# Patient Record
Sex: Male | Born: 1950 | Race: White | Hispanic: No | Marital: Married | Smoking: Former smoker
Health system: Southern US, Community
[De-identification: ages and names within clinical notes are randomized; demographics above are authoritative.]

## PROBLEM LIST (undated history)

## (undated) DIAGNOSIS — H409 Unspecified glaucoma: Secondary | ICD-10-CM

## (undated) DIAGNOSIS — R519 Headache, unspecified: Secondary | ICD-10-CM

## (undated) DIAGNOSIS — Z9842 Cataract extraction status, left eye: Secondary | ICD-10-CM

## (undated) DIAGNOSIS — E119 Type 2 diabetes mellitus without complications: Secondary | ICD-10-CM

## (undated) DIAGNOSIS — Z7982 Long term (current) use of aspirin: Secondary | ICD-10-CM

## (undated) DIAGNOSIS — I739 Peripheral vascular disease, unspecified: Secondary | ICD-10-CM

## (undated) DIAGNOSIS — I779 Disorder of arteries and arterioles, unspecified: Secondary | ICD-10-CM

## (undated) DIAGNOSIS — G4730 Sleep apnea, unspecified: Secondary | ICD-10-CM

## (undated) DIAGNOSIS — I219 Acute myocardial infarction, unspecified: Secondary | ICD-10-CM

## (undated) DIAGNOSIS — I10 Essential (primary) hypertension: Secondary | ICD-10-CM

## (undated) DIAGNOSIS — C4491 Basal cell carcinoma of skin, unspecified: Secondary | ICD-10-CM

## (undated) DIAGNOSIS — E785 Hyperlipidemia, unspecified: Secondary | ICD-10-CM

## (undated) DIAGNOSIS — E109 Type 1 diabetes mellitus without complications: Secondary | ICD-10-CM

## (undated) DIAGNOSIS — J189 Pneumonia, unspecified organism: Secondary | ICD-10-CM

## (undated) DIAGNOSIS — E1142 Type 2 diabetes mellitus with diabetic polyneuropathy: Secondary | ICD-10-CM

## (undated) DIAGNOSIS — Z7902 Long term (current) use of antithrombotics/antiplatelets: Secondary | ICD-10-CM

## (undated) DIAGNOSIS — G5602 Carpal tunnel syndrome, left upper limb: Secondary | ICD-10-CM

## (undated) DIAGNOSIS — I209 Angina pectoris, unspecified: Secondary | ICD-10-CM

## (undated) DIAGNOSIS — M1990 Unspecified osteoarthritis, unspecified site: Secondary | ICD-10-CM

## (undated) DIAGNOSIS — I6529 Occlusion and stenosis of unspecified carotid artery: Secondary | ICD-10-CM

## (undated) DIAGNOSIS — M869 Osteomyelitis, unspecified: Secondary | ICD-10-CM

## (undated) DIAGNOSIS — Z972 Presence of dental prosthetic device (complete) (partial): Secondary | ICD-10-CM

## (undated) DIAGNOSIS — G4733 Obstructive sleep apnea (adult) (pediatric): Secondary | ICD-10-CM

## (undated) DIAGNOSIS — I2510 Atherosclerotic heart disease of native coronary artery without angina pectoris: Secondary | ICD-10-CM

## (undated) DIAGNOSIS — N183 Chronic kidney disease, stage 3 unspecified: Secondary | ICD-10-CM

## (undated) DIAGNOSIS — T7840XA Allergy, unspecified, initial encounter: Secondary | ICD-10-CM

## (undated) DIAGNOSIS — L97519 Non-pressure chronic ulcer of other part of right foot with unspecified severity: Secondary | ICD-10-CM

## (undated) DIAGNOSIS — I700 Atherosclerosis of aorta: Secondary | ICD-10-CM

## (undated) DIAGNOSIS — Z973 Presence of spectacles and contact lenses: Secondary | ICD-10-CM

## (undated) DIAGNOSIS — I6789 Other cerebrovascular disease: Secondary | ICD-10-CM

## (undated) DIAGNOSIS — K219 Gastro-esophageal reflux disease without esophagitis: Secondary | ICD-10-CM

## (undated) DIAGNOSIS — Z9622 Myringotomy tube(s) status: Secondary | ICD-10-CM

## (undated) DIAGNOSIS — Z22322 Carrier or suspected carrier of Methicillin resistant Staphylococcus aureus: Secondary | ICD-10-CM

## (undated) DIAGNOSIS — I5189 Other ill-defined heart diseases: Secondary | ICD-10-CM

## (undated) DIAGNOSIS — G629 Polyneuropathy, unspecified: Secondary | ICD-10-CM

## (undated) DIAGNOSIS — C4490 Unspecified malignant neoplasm of skin, unspecified: Secondary | ICD-10-CM

## (undated) DIAGNOSIS — J40 Bronchitis, not specified as acute or chronic: Secondary | ICD-10-CM

## (undated) DIAGNOSIS — Z9841 Cataract extraction status, right eye: Secondary | ICD-10-CM

## (undated) DIAGNOSIS — R0600 Dyspnea, unspecified: Secondary | ICD-10-CM

## (undated) DIAGNOSIS — N189 Chronic kidney disease, unspecified: Secondary | ICD-10-CM

## (undated) HISTORY — DX: Unspecified osteoarthritis, unspecified site: M19.90

## (undated) HISTORY — PX: CATARACT EXTRACTION, BILATERAL: SHX1313

## (undated) HISTORY — DX: Peripheral vascular disease, unspecified: I73.9

## (undated) HISTORY — PX: EYE SURGERY: SHX253

## (undated) HISTORY — PX: JOINT REPLACEMENT: SHX530

## (undated) HISTORY — PX: APPENDECTOMY: SHX54

## (undated) HISTORY — DX: Non-pressure chronic ulcer of other part of right foot with unspecified severity: L97.519

## (undated) HISTORY — PX: MULTIPLE TOOTH EXTRACTIONS: SHX2053

## (undated) HISTORY — DX: Unspecified glaucoma: H40.9

## (undated) HISTORY — DX: Type 2 diabetes mellitus without complications: E11.9

## (undated) HISTORY — DX: Unspecified malignant neoplasm of skin, unspecified: C44.90

## (undated) HISTORY — PX: KNEE ARTHROSCOPY: SHX127

## (undated) HISTORY — PX: CATARACT EXTRACTION W/ INTRAOCULAR LENS  IMPLANT, BILATERAL: SHX1307

## (undated) HISTORY — DX: Essential (primary) hypertension: I10

## (undated) SURGERY — LEFT HEART CATH AND CORONARY ANGIOGRAPHY
Anesthesia: Moderate Sedation

---
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
## 1998-11-03 HISTORY — PX: KNEE ARTHROSCOPY: SHX127

## 2007-07-05 DIAGNOSIS — I2510 Atherosclerotic heart disease of native coronary artery without angina pectoris: Secondary | ICD-10-CM

## 2007-07-05 HISTORY — DX: Atherosclerotic heart disease of native coronary artery without angina pectoris: I25.10

## 2007-07-27 DIAGNOSIS — I214 Non-ST elevation (NSTEMI) myocardial infarction: Secondary | ICD-10-CM

## 2007-07-27 HISTORY — DX: Non-ST elevation (NSTEMI) myocardial infarction: I21.4

## 2007-07-29 ENCOUNTER — Inpatient Hospital Stay: Payer: Self-pay | Admitting: Unknown Physician Specialty

## 2007-07-29 ENCOUNTER — Other Ambulatory Visit: Payer: Self-pay

## 2007-07-30 HISTORY — PX: LEFT HEART CATH AND CORONARY ANGIOGRAPHY: CATH118249

## 2008-07-20 ENCOUNTER — Ambulatory Visit: Payer: Self-pay

## 2008-08-08 ENCOUNTER — Other Ambulatory Visit: Payer: Self-pay

## 2008-08-08 ENCOUNTER — Ambulatory Visit: Payer: Self-pay | Admitting: Unknown Physician Specialty

## 2008-08-11 ENCOUNTER — Ambulatory Visit: Payer: Self-pay | Admitting: Unknown Physician Specialty

## 2011-05-28 ENCOUNTER — Ambulatory Visit: Payer: Self-pay | Admitting: Ophthalmology

## 2012-07-16 HISTORY — PX: CATARACT EXTRACTION W/ INTRAOCULAR LENS IMPLANT: SHX1309

## 2012-08-13 HISTORY — PX: CATARACT EXTRACTION W/ INTRAOCULAR LENS IMPLANT: SHX1309

## 2013-09-08 ENCOUNTER — Encounter: Payer: Self-pay | Admitting: Podiatry

## 2013-09-08 ENCOUNTER — Ambulatory Visit: Admitting: Podiatry

## 2013-09-08 VITALS — BP 115/58 | HR 80 | Resp 16 | Ht 71.0 in | Wt 273.0 lb

## 2013-09-08 DIAGNOSIS — M79609 Pain in unspecified limb: Secondary | ICD-10-CM

## 2013-09-08 DIAGNOSIS — B351 Tinea unguium: Secondary | ICD-10-CM

## 2013-09-08 NOTE — Progress Notes (Signed)
Tailor presents today as a 62 year old white male with a chief complaint of painful  thick mycotic nails, diabetes, any heel fissures.  Objective: Pulses are strongly palpable bilateral lower extremity. Nails are thick yellow dystrophic clinically mycotic and sharply incurvated.  Assessment: Pain in limb secondary to onychomycosis 1 through 5 bilateral.  Plan: Debridement of nails in thickness and length as a covered service secondary to pain followup with him in 3 months.

## 2013-10-03 ENCOUNTER — Telehealth: Payer: Self-pay | Admitting: *Deleted

## 2013-10-03 NOTE — Telephone Encounter (Signed)
You may refill °

## 2013-10-03 NOTE — Telephone Encounter (Signed)
Received fax refill request.

## 2013-10-04 MED ORDER — L-METHYLFOLATE-B6-B12 3-35-2 MG PO TABS: 1.0000 | ORAL_TABLET | Freq: Two times a day (BID) | ORAL | Status: DC

## 2013-10-07 ENCOUNTER — Telehealth: Payer: Self-pay | Admitting: *Deleted

## 2013-10-07 NOTE — Telephone Encounter (Signed)
Pt called said he did not receive metanax from his pharmacy, received foltanx. Per Morrie Sheldon this is the same thing, its a supplement and its cheaper. Pt did understand and will start usage.

## 2013-10-11 ENCOUNTER — Observation Stay: Payer: Self-pay | Admitting: Student

## 2013-10-11 LAB — BASIC METABOLIC PANEL
Anion Gap: 7 (ref 7–16)
Calcium, Total: 9 mg/dL (ref 8.5–10.1)
Chloride: 101 mmol/L (ref 98–107)
Creatinine: 1.11 mg/dL (ref 0.60–1.30)
Osmolality: 290 (ref 275–301)
Sodium: 136 mmol/L (ref 136–145)

## 2013-10-11 LAB — CBC
HCT: 38 % — ABNORMAL LOW (ref 40.0–52.0)
MCH: 32.1 pg (ref 26.0–34.0)
MCHC: 33.7 g/dL (ref 32.0–36.0)
MCV: 96 fL (ref 80–100)
Platelet: 230 10*3/uL (ref 150–440)
RDW: 13.2 % (ref 11.5–14.5)
WBC: 8.4 10*3/uL (ref 3.8–10.6)

## 2013-10-11 LAB — LIPID PANEL
Ldl Cholesterol, Calc: 79 mg/dL (ref 0–100)
Triglycerides: 53 mg/dL (ref 0–200)

## 2013-10-11 LAB — TROPONIN I
Troponin-I: <0.02 ng/mL
Troponin-I: <0.02 ng/mL

## 2013-10-12 LAB — CBC WITH DIFFERENTIAL/PLATELET
Basophil #: 0 10*3/uL (ref 0.0–0.1)
Basophil %: 0.6 %
Eosinophil #: 0.2 10*3/uL (ref 0.0–0.7)
Eosinophil %: 2.8 %
HCT: 36.9 % — ABNORMAL LOW (ref 40.0–52.0)
Lymphocyte #: 2.3 10*3/uL (ref 1.0–3.6)
Lymphocyte %: 33.1 %
MCH: 32.9 pg (ref 26.0–34.0)
MCV: 95 fL (ref 80–100)
Monocyte #: 0.7 x10 3/mm (ref 0.2–1.0)
Neutrophil #: 3.7 10*3/uL (ref 1.4–6.5)
Neutrophil %: 53.4 %
RBC: 3.89 10*6/uL — ABNORMAL LOW (ref 4.40–5.90)
RDW: 13.1 % (ref 11.5–14.5)
WBC: 6.9 10*3/uL (ref 3.8–10.6)

## 2013-10-12 LAB — BASIC METABOLIC PANEL
Anion Gap: 5 — ABNORMAL LOW (ref 7–16)
BUN: 20 mg/dL — ABNORMAL HIGH (ref 7–18)
Calcium, Total: 8.8 mg/dL (ref 8.5–10.1)
Chloride: 101 mmol/L (ref 98–107)
EGFR (African American): >60
EGFR (Non-African Amer.): >60
Glucose: 218 mg/dL — ABNORMAL HIGH (ref 65–99)
Potassium: 3.9 mmol/L (ref 3.5–5.1)
Sodium: 137 mmol/L (ref 136–145)

## 2013-12-07 ENCOUNTER — Ambulatory Visit (INDEPENDENT_AMBULATORY_CARE_PROVIDER_SITE_OTHER): Payer: Medicare Other | Admitting: Podiatry

## 2013-12-07 ENCOUNTER — Encounter: Payer: Self-pay | Admitting: Podiatry

## 2013-12-07 VITALS — BP 127/69 | HR 82 | Resp 16 | Ht 71.0 in | Wt 293.0 lb

## 2013-12-07 DIAGNOSIS — M79609 Pain in unspecified limb: Secondary | ICD-10-CM

## 2013-12-07 DIAGNOSIS — B351 Tinea unguium: Secondary | ICD-10-CM

## 2013-12-07 MED ORDER — CEPHALEXIN 500 MG PO CAPS: 500.0000 mg | ORAL_CAPSULE | Freq: Two times a day (BID) | ORAL | Status: DC

## 2013-12-07 NOTE — Progress Notes (Signed)
He presents today with painful toenails one through 5 bilateral.  Objective evaluation reveals vital signs are stable he is alert and oriented x3. Nails are thick yellow dystrophic onychomycotic and painful palpation. His sharp incurvated nail margins in irritation to the distal aspect of the hallux right. I debrided the nail margins today with with no purulence noted.  Assessment: Pain in limb secondary to onychomycosis 1 through 5 bilateral. Possible abscess hallux right.  Plan: Debridement nails 1 through 5 bilateral is cover service secondary to pain. I placed him on Keflex 500 mg 1 by mouth twice a day. He'll soak his foot in Epsom salts warm water twice daily. Followup with him in 2 weeks for this evaluation. In 3 months for routine nail debridement.

## 2013-12-21 ENCOUNTER — Ambulatory Visit: Admitting: Podiatry

## 2014-03-08 ENCOUNTER — Ambulatory Visit (INDEPENDENT_AMBULATORY_CARE_PROVIDER_SITE_OTHER): Payer: Medicare Other | Admitting: Podiatry

## 2014-03-08 ENCOUNTER — Encounter: Payer: Self-pay | Admitting: Podiatry

## 2014-03-08 VITALS — BP 135/70 | HR 86 | Resp 16

## 2014-03-08 DIAGNOSIS — M79609 Pain in unspecified limb: Secondary | ICD-10-CM

## 2014-03-08 DIAGNOSIS — B351 Tinea unguium: Secondary | ICD-10-CM

## 2014-03-08 NOTE — Progress Notes (Signed)
He presents today with a chief complaint of painful elongated toenails one through 5 bilateral.  Objective: Pulses are strongly palpable bilateral. Nails are thick yellow dystrophic with mycotic and painful palpation.  Assessment: Pain in limb secondary to onychomycosis 1 through 5 bilateral.  Plan: Debridement of nails 1 through 5 bilateral covered service secondary to pain.

## 2014-06-07 ENCOUNTER — Ambulatory Visit (INDEPENDENT_AMBULATORY_CARE_PROVIDER_SITE_OTHER): Payer: Medicare Other | Admitting: Podiatry

## 2014-06-07 ENCOUNTER — Encounter: Payer: Self-pay | Admitting: Podiatry

## 2014-06-07 DIAGNOSIS — B351 Tinea unguium: Secondary | ICD-10-CM

## 2014-06-07 DIAGNOSIS — M79609 Pain in unspecified limb: Secondary | ICD-10-CM

## 2014-06-07 DIAGNOSIS — M79676 Pain in unspecified toe(s): Secondary | ICD-10-CM

## 2014-06-07 MED ORDER — L-METHYLFOLATE-B6-B12 3-35-2 MG PO TABS: 1.0000 | ORAL_TABLET | Freq: Two times a day (BID) | ORAL | Status: DC

## 2014-06-07 NOTE — Progress Notes (Signed)
Presents today with a chief complaint of painful elongated toenails one through 5 bilateral.  Objective: Nails are thick yellow dystrophic with mycotic and painful palpation.  Assessment: Pain in limb secondary to onychomycosis 1 through 5 bilateral.  Plan: Discussed etiology pathology conservative or surgical therapies debridement nails 1 through 5 bilateral covered service secondary to pain to 

## 2014-06-08 ENCOUNTER — Other Ambulatory Visit: Payer: Self-pay | Admitting: *Deleted

## 2014-06-08 ENCOUNTER — Telehealth: Payer: Self-pay | Admitting: *Deleted

## 2014-06-08 NOTE — Telephone Encounter (Signed)
Left message for patient to call us back.. This is regarding metanx not being a covered , wanting patient to be aware of this , ask patient what he would like to do ?

## 2014-09-13 ENCOUNTER — Ambulatory Visit (INDEPENDENT_AMBULATORY_CARE_PROVIDER_SITE_OTHER): Payer: Medicare Other | Admitting: Podiatry

## 2014-09-13 DIAGNOSIS — B351 Tinea unguium: Secondary | ICD-10-CM

## 2014-09-13 DIAGNOSIS — M79676 Pain in unspecified toe(s): Secondary | ICD-10-CM

## 2014-09-13 NOTE — Progress Notes (Signed)
Presents today with a chief complaint of painful elongated toenails one through 5 bilateral.  Objective: Nails are thick yellow dystrophic with mycotic and painful palpation.  Assessment: Pain in limb secondary to onychomycosis 1 through 5 bilateral.  Plan: Discussed etiology pathology conservative or surgical therapies debridement nails 1 through 5 bilateral covered service secondary to pain to 

## 2014-11-09 DIAGNOSIS — M79605 Pain in left leg: Secondary | ICD-10-CM | POA: Diagnosis not present

## 2014-11-15 DIAGNOSIS — M79605 Pain in left leg: Secondary | ICD-10-CM | POA: Diagnosis not present

## 2014-11-22 DIAGNOSIS — I10 Essential (primary) hypertension: Secondary | ICD-10-CM | POA: Diagnosis not present

## 2014-11-22 DIAGNOSIS — I25119 Atherosclerotic heart disease of native coronary artery with unspecified angina pectoris: Secondary | ICD-10-CM | POA: Diagnosis not present

## 2014-11-22 DIAGNOSIS — E782 Mixed hyperlipidemia: Secondary | ICD-10-CM | POA: Diagnosis not present

## 2014-11-23 DIAGNOSIS — M79605 Pain in left leg: Secondary | ICD-10-CM | POA: Diagnosis not present

## 2014-11-30 DIAGNOSIS — M79605 Pain in left leg: Secondary | ICD-10-CM | POA: Diagnosis not present

## 2014-12-07 DIAGNOSIS — M79605 Pain in left leg: Secondary | ICD-10-CM | POA: Diagnosis not present

## 2014-12-20 ENCOUNTER — Ambulatory Visit (INDEPENDENT_AMBULATORY_CARE_PROVIDER_SITE_OTHER): Payer: Medicare Other | Admitting: Podiatry

## 2014-12-20 DIAGNOSIS — M79676 Pain in unspecified toe(s): Secondary | ICD-10-CM

## 2014-12-20 DIAGNOSIS — B351 Tinea unguium: Secondary | ICD-10-CM

## 2014-12-20 NOTE — Progress Notes (Signed)
Presents today with a chief complaint of painful elongated toenails one through 5 bilateral.  Objective: Nails are thick yellow dystrophic with mycotic and painful palpation.  Assessment: Pain in limb secondary to onychomycosis 1 through 5 bilateral.  Plan: Discussed etiology pathology conservative or surgical therapies debridement nails 1 through 5 bilateral covered service secondary to pain to

## 2014-12-28 DIAGNOSIS — M79605 Pain in left leg: Secondary | ICD-10-CM | POA: Diagnosis not present

## 2015-01-01 DIAGNOSIS — I119 Hypertensive heart disease without heart failure: Secondary | ICD-10-CM | POA: Diagnosis not present

## 2015-01-01 DIAGNOSIS — I10 Essential (primary) hypertension: Secondary | ICD-10-CM | POA: Diagnosis not present

## 2015-01-01 DIAGNOSIS — E785 Hyperlipidemia, unspecified: Secondary | ICD-10-CM | POA: Diagnosis not present

## 2015-01-01 DIAGNOSIS — G4733 Obstructive sleep apnea (adult) (pediatric): Secondary | ICD-10-CM | POA: Diagnosis not present

## 2015-01-01 DIAGNOSIS — E119 Type 2 diabetes mellitus without complications: Secondary | ICD-10-CM | POA: Diagnosis not present

## 2015-01-01 DIAGNOSIS — H47339 Pseudopapilledema of optic disc, unspecified eye: Secondary | ICD-10-CM | POA: Diagnosis not present

## 2015-01-08 ENCOUNTER — Ambulatory Visit (INDEPENDENT_AMBULATORY_CARE_PROVIDER_SITE_OTHER): Payer: Medicare Other

## 2015-01-08 ENCOUNTER — Ambulatory Visit (INDEPENDENT_AMBULATORY_CARE_PROVIDER_SITE_OTHER): Payer: Medicare Other | Admitting: Podiatry

## 2015-01-08 VITALS — BP 125/55 | HR 72 | Resp 16

## 2015-01-08 DIAGNOSIS — L97511 Non-pressure chronic ulcer of other part of right foot limited to breakdown of skin: Secondary | ICD-10-CM

## 2015-01-08 DIAGNOSIS — L89891 Pressure ulcer of other site, stage 1: Secondary | ICD-10-CM

## 2015-01-08 MED ORDER — AMOXICILLIN-POT CLAVULANATE 875-125 MG PO TABS: 1.0000 | ORAL_TABLET | Freq: Two times a day (BID) | ORAL | Status: DC

## 2015-01-08 NOTE — Progress Notes (Signed)
He presents today with a history of diabetes concerned about the redness in his hallux right. He states this to like it's about the right off and it has not been traumatized and has not been rubbing anything.  Objective: Vital signs are stable he is alert and oriented 3. 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 The hallux right demonstrates blistering to the distalmost aspect was cellulitis extending to the level of the IP joint. The distalmost aspect of the toe and the skin are macerated and easily removed. There is no purulence but there is mild malodor.  Assessment: Cellulitis. Complication diabetic foot hallux right.  Plan: Debrided the ulcerative lesion today to bleeding dressed with Silvadene cream and a dry sterile compressive dressing. Wrote a prescription for Augmentin 875 mg 1 by mouth twice a day. He will start soaking twice daily and abscess also water and he was dispensed a Darco shoe. I will follow-up with him in 1 week

## 2015-01-15 ENCOUNTER — Other Ambulatory Visit: Payer: Self-pay | Admitting: *Deleted

## 2015-01-15 ENCOUNTER — Ambulatory Visit (INDEPENDENT_AMBULATORY_CARE_PROVIDER_SITE_OTHER): Payer: Medicare Other | Admitting: Podiatry

## 2015-01-15 VITALS — BP 138/72 | HR 78 | Resp 16

## 2015-01-15 DIAGNOSIS — L89891 Pressure ulcer of other site, stage 1: Secondary | ICD-10-CM | POA: Diagnosis not present

## 2015-01-15 DIAGNOSIS — L97511 Non-pressure chronic ulcer of other part of right foot limited to breakdown of skin: Secondary | ICD-10-CM

## 2015-01-15 MED ORDER — AMOXICILLIN-POT CLAVULANATE 875-125 MG PO TABS: 1.0000 | ORAL_TABLET | Freq: Two times a day (BID) | ORAL | Status: DC

## 2015-01-15 NOTE — Progress Notes (Signed)
He presents today for follow-up of his hallux right superficial ulceration that was present. He states that he continues to put Neosporin on it and has not yet received his antibiotics which he ordered by mail.  Objective: Vital signs are stable he is alert and oriented 3 the distalmost aspect of the toe appears to be soft and moist with white scan around the ulcerative site this does not probe deep to bone nor does it need to be debrided to bleeding. The toe is mildly erythematous but I think this associated with secondary superficial infection.  Assessment: Hallux ulceration distal right toe.  Plan: Continue to soak discontinue Neosporin covered during the day and leave open at night and night. Start antibiotics once received. He will follow-up with Dr. Jacqualyn Posey in 2 weeks.

## 2015-01-29 DIAGNOSIS — G4733 Obstructive sleep apnea (adult) (pediatric): Secondary | ICD-10-CM | POA: Diagnosis not present

## 2015-01-29 DIAGNOSIS — D510 Vitamin B12 deficiency anemia due to intrinsic factor deficiency: Secondary | ICD-10-CM | POA: Diagnosis not present

## 2015-01-30 ENCOUNTER — Ambulatory Visit (INDEPENDENT_AMBULATORY_CARE_PROVIDER_SITE_OTHER): Payer: Medicare Other | Admitting: Podiatry

## 2015-01-30 ENCOUNTER — Encounter: Payer: Self-pay | Admitting: Podiatry

## 2015-01-30 DIAGNOSIS — L600 Ingrowing nail: Secondary | ICD-10-CM

## 2015-02-01 NOTE — Progress Notes (Signed)
Patient ID: Lawrence [name], male   DOB: 08-Jan-1951, 64 y.o.   MRN: [id number]  Subjective: 64 year old male presents the office they for follow-up evaluation of right hallux superficial ulceration. He states the wound is doing great has closed however he does now have an ingrown toenail along the inside border of the same toenail. He denies any drainage associated with the area. He denies any specific redness to this area although is difficult to evaluate due to the previous ulceration. He denies any systemic complaints as fevers, chills, nausea, vomiting. He is currently on Keflex for which she has a couple more days left. No other complaints at this time.  Objective: AAO 3, NAD DP/PT pulses palpable, CRT less than 3 seconds  Protective sensation decreased with Simms Weinstein monofilament, Achilles tendon reflex intact. Right hallux there is no ulceration identify this time. Over the site of the apparent previous ulceration there is some dry skin in hyperkeratotic lesion. Upon debridement of the lesion there is no underlying ulceration of the skin is intact. There is trace erythema to the digit although it does appear to be resolving. There is tenderness to palpation along the medial aspect of the left hallux toenail distally. There is no pain on the proximal medial border of the toenail. There is no purulence or drainage expressed from the toenail. There is no pain along the lateral nail border. No other areas of tenderness to bilateral lower extremity. No other open lesions or pre-ulcerative lesions are identified. No pain with calf compression, swelling, warmth, erythema.  Assessment: 64 year old male with healed left hallux ulceration; left distal medial symptomatic ingrown toenail  Plan: -Treatment options were discussed the patient lying alternatives, risks, competitions. -At this time the ulceration is healed. Hyperkeratotic tissue on the distal aspect of the toe is sharply debrided without  complication/bleeding. No underlying ulceration. Monitor for any signs or symptoms of reoccurrence. -For the ingrown toenail right discussed either slant back procedure partial nail avulsion. However given the history of ulceration of this toe recommend a slant back procedure. Patient agrees. The nail sharply debrided without complications to patient comfort to remove the symptomatic border of the ingrown toenail. After debridement of the nail the patient had resolution of symptoms. There is no purulence or drainage expressed with the procedure. -Finish course of antibiotics. -Follow-up in 2 weeks or sooner if any problems are to arise. In the meantime encouraged to call the office with any questions, concerns, change in symptoms.

## 2015-02-13 ENCOUNTER — Encounter: Payer: Self-pay | Admitting: Podiatry

## 2015-02-13 ENCOUNTER — Ambulatory Visit (INDEPENDENT_AMBULATORY_CARE_PROVIDER_SITE_OTHER): Payer: Medicare Other | Admitting: Podiatry

## 2015-02-13 VITALS — BP 135/80 | HR 82 | Resp 16

## 2015-02-13 DIAGNOSIS — E1140 Type 2 diabetes mellitus with diabetic neuropathy, unspecified: Secondary | ICD-10-CM

## 2015-02-13 DIAGNOSIS — M79676 Pain in unspecified toe(s): Secondary | ICD-10-CM

## 2015-02-13 DIAGNOSIS — L84 Corns and callosities: Secondary | ICD-10-CM

## 2015-02-13 DIAGNOSIS — B351 Tinea unguium: Secondary | ICD-10-CM | POA: Diagnosis not present

## 2015-02-13 DIAGNOSIS — E1149 Type 2 diabetes mellitus with other diabetic neurological complication: Secondary | ICD-10-CM

## 2015-02-13 NOTE — Progress Notes (Signed)
Patient ID: Lawrence [name], male   DOB: [birth year], 64 y.o.   MRN: [id number]  Subjective: 64 y.o.-year-old male returns the office today for follow up evaluation of right hallux ulceration as well as painful, elongated, thickened toenails which he is unable to trim himself. He states the ulcer site has healed and he no longer puts any ointments on it. Denies any redness or drainage around the nails or along the site of the ulceration. States the left hallux toenail has been doing "great" since it was trimmed last appointment. Denies any acute changes since last appointment and no new complaints today. Denies any systemic complaints such as fevers, chills, nausea, vomiting.   Objective: AAO 3, NAD DP/PT pulses palpable, CRT less than 3 seconds Protective sensation decreased with Simms Weinstein monofilament, Achilles tendon reflex intact.  Nails hypertrophic, dystrophic, elongated, brittle, discolored 10. There is tenderness overlying the nails 1-5 bilaterally. There is no surrounding erythema or drainage along the nail sites. Ulceration of the distal aspect of the right hallux has healed. There is overlying hyperkerotic tissues. Upon debridement the underlying skin is intact and there is no ulceration, drainage, or other signs of infection.  No open lesions or other pre-ulcerative lesions are identified. No other areas of tenderness bilateral lower extremities. No overlying edema, erythema, increased warmth. No pain with calf compression, swelling, warmth, erythema.  Assessment: Patient presents with symptomatic onychomycosis; healed right hallux ulceration.   Plan: -Treatment options including alternatives, risks, complications were discussed -Nails sharply debrided 10 without complication/bleeding. -Right hallux hyperkerotic tissue debrided without complications/bleeding. Dispensed offloading pads. -Discussed daily foot inspection. If there are any changes, to call the office immediately.   -Follow-up in 3 months or sooner if any problems are to arise. In the meantime, encouraged to call the office with any questions, concerns, changes symptoms.

## 2015-02-23 NOTE — Discharge Summary (Signed)
PATIENT NAME:  Lawrence [name], Lawrence [name] MR#:  [id number] DATE OF BIRTH:  30-Jul-1951  DATE OF ADMISSION:  10/11/2013 DATE OF DISCHARGE:  10/12/2013  CONSULTANTS: Dr. Nehemiah Massed from cardiology.   PRIMARY CARE PHYSICIAN:  Dr. Nicky Pugh, and also he goes to the [place].    CHIEF COMPLAINT:   Chest pain.  DISCHARGE DIAGNOSES: 1.  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  2.  Labile diabetes.  3.  Coronary artery disease.  4.  Seasonal allergies. 5.  Neuropathy.  6.  Sleep apnea.  7.  Hyperlipidemia.  8.  Hypertension.   DISCHARGE MEDICATIONS:  1.  Nitroglycerin sublingual p.r.n. 2.  Vitamin D3, 1000 units 2 caps once a day.  3.  Zyrtec 10 mg daily.  4.  Metoprolol tartrate 25 mg 2 times a day.  5.  Chlorthalidone 25 mg 1/2 tab once a day.  6.  Insulin aspart sliding scale per carbohydrate counting.  7.  Ranexa 1000 mg 2 times a day.  8.  Vitamin C 500 mg 2 times a day.  9.  Niacin 500 mg 2 times a day.  10. Imethyl 1 tab 2 times a day.  11.  Cinnamon 500 mg 2 caps 2 times a day. 12.  Insulin glargine 15 units at bedtime.   DIET: Low-sodium, low-fat, low-cholesterol, ADA diet.   ACTIVITY: As tolerated.   Please follow with PCP within 1 to 2 weeks. Please follow with Dr. Nehemiah Massed within a week. Please follow with your endocrinologist on Monday, as previously scheduled.   DISPOSITION: Home.   SIGNIFICANT LABS AND IMAGING: Troponins were negative x 3. Initial sugar was 375, BUN 21, creatinine 1.11. White count of 8.4 on admission and LDL of 79 on admission. Echocardiogram done on December 10 showing EF of 50% to 55%, normal global LV systolic function.   HISTORY OF PRESENT ILLNESS AND HOSPITAL COURSE: For full details of the H and P, please see the dictation done on December 9 by Dr. Laurin Coder, but briefly this is a Lawrence [name] with a history of well known coronary artery disease with multiple defects including 85% distal right coronary artery, 30% proximal LAD, 99% MD1 and 50% circumflex  and 40% distal circumflex with diffuse disease in distal obtuse. He has been having labile blood sugars as an outpatient, although compliant with diet. He does see an endocrinologist as an outpatient as well. He came in after having chest pain. He was noted to have blood sugars of 25, given glucagon and glucose.  He was also possibly unresponsive. He got some glucagon and glucose per H and P. He attempted to go back to sleep, but had some anxiety, and EMS was called again  after the patient experienced some chest pain. He was admitted to hospital service for observation and ruling out acute coronary syndrome. While here, he did get ruled out for acute coronary syndrome. He does have known multivessel disease, and the case was discussed with Dr. Nehemiah Massed, his cardiologist, who recommended outpatient followup, as he has been ruled out for acute coronary syndrome and has no further chest pain. I believe the unresponsive episode was possibly metabolic encephalopathy secondary to hypoglycemia, which has resolved. Here, we have cut back on his Lantus and will be discharged on a lower dose of Lantus with a sliding scale insulin, and he is to follow up with his primary endocrinologist as an outpatient for further medication adjustments. He did undergo an echocardiogram per Dr. Nehemiah Massed, and was noted to have good EF. If  he was to have lower EF, a cardiac cath and further intervention would have been recommended, but at this point, without any acute coronary syndrome, no chest pains and an echo showing the above EF, per Dr. Nehemiah Massed, it is okay for him to get discharged. He is to follow up with Dr. Nehemiah Massed within a week, preferably early next week, and verbalized understanding. At this point, he will be discharged with outpatient followup.   Total time spent is 32 minutes.   The patient is FULL CODE.   ____________________________ Vivien Presto, MD sa:dmm D: 10/12/2013 17:24:15 ET T: 10/12/2013 20:10:01  ET JOB#: [id number]  cc: Vivien Presto, MD, <Dictator> Mikeal Hawthorne. Brynda Greathouse, MD Vivien Presto MD ELECTRONICALLY SIGNED 10/18/2013 11:08

## 2015-02-23 NOTE — H&P (Signed)
PATIENT NAME:  Lawrence [name], Lawrence [name] MR#:  [id number] DATE OF BIRTH:  07-Jun-1951  DATE OF ADMISSION:  10/11/2013  PRIMARY CARE PHYSICIAN: Dr. Nicky Pugh and also the [facility].  ENDOCRINOLOGIST:  The patient has an endocrinologist at the [facility] as well.   CARDIOLOGIST: Dr. Serafina Royals.   CHIEF COMPLAINT: Chest pain.   REFERRING PHYSICIAN: Dr. Marjean Donna.   HISTORY OF PRESENT ILLNESS: This is a very nice, 64 year old gentleman, who has history of very well known coronary artery disease with multiple defects including 85% distal right coronary artery, 30% proximal LAD, 99% D1, 50% circumflex, 40% distal circumflex with diffuse disease on distal obtuse. The patient has been recommended to do medical therapy including exercise, diet and taking medications. When he has been compliant with his diet, he actually had a weight loss from 290 down to 270, but now is back to 291. The patient also has hypertension, hyperlipidemia and sleep apnea. The patient comes today with a history of being unresponsive at 1:00 a.m. in the morning. His wife noticed that the patient was sweaty and not waking up and that was after taking his normal dose of insulin and eating normally through the day. The patient's wife called the ambulance.  EMS arrived and checked his blood sugar and it was 25. He was given medications apparently glucagon and glucose and then the patient woke up  and he was asymptomatic. He was really scared. He tried to go back to bed. He checked his blood sugar again at around 4:00 a.m. and he was in the 300s, for what he needed to give some of his insulin sliding scale correction. The patient could not sleep and went down to bed and he was just feeling very anxious. He started having significant chest pain. The chest pain was described as roving back around the chest that expanded and contracted multiple times. It was 10 out of  10, feeling of tightness and pressure and it made him more anxious. He  felt like his heart was racing a little bit. He had no radiation of the pain. The pain continued for what he called EMS. EMS gave him nitroglycerin in the ambulance and the pain started to resolve and by now the pain is gone. In the ER, the patient got evaluated. There was no significant tachypnea, shortness of breath, nausea or diaphoresis associated with these episodes. The patient had an EKG that did not any significant changes. His first cardiac enzymes are negative. We are going to admit him to the hospital to rule out acute coronary syndrome for now and monitor him and get Dr. Nehemiah Massed to see him for medical therapy and try to discharge him possibly today or tomorrow. As far as his medications, he is taking them religiously. As far as his insulin, there has not been any changes in his insulin. There has not been any significant changes in his diet to make him have hypoglycemia. The patient denies any signs of infection or problems. At this moment, he is asymptomatic and admitted for rule out acute coronary syndrome.    REVIEW OF SYSTEMS: CONSTITUTIONAL: No fever, fatigue or weakness. EYES: No double vision, blurry vision. EARS, NOSE, THROAT: No tinnitus, epistaxis or difficulty swallowing. RESPIRATORY: No cough, wheezing or hemoptysis. CARDIOVASCULAR: Positive chest pain as mentioned above. The patient states that he does have regular chest pains on a regular basis. 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 It does not happen significantly when he walks because he does not walk major distances. He does not like to exercise. No orthopnea, edema or syncope. GASTROINTESTINAL: No nausea, vomiting, abdominal pain, constipation or diarrhea. GENITOURINARY: No dysuria or hematuria. No prostatitis. ENDOCRINE: No polyuria, polydipsia, polyphagia, cold or heat intolerance. HEMATOLOGIC  AND LYMPHATIC: No anemia, easy bruising or bleeding.  SKIN: No rashes or petechiae. MUSCULOSKELETAL: No significant neck pain, back pain or gout. NEUROLOGIC: No numbness, tingling, or CVAs. PSYCHIATRIC: No anxiety, depression or insomnia on a regular basis.   PAST MEDICAL HISTORY: 1.  Coronary artery disease with positive cardiac catheterization with multiple defects. They are not able to be fixed by stents. Also, last stress test on 07/11/2013 shows significant findings related with ischemia.  2.  Hypertension.  3.  Hyperlipidemia.  4.  Sleep apnea.  5.  Type 2 diabetes. 6.  Neuropathy. 7.  Seasonal allergies.  CURRENT MEDICATIONS: Include Lantus 17 units every night, chlorthalidone 25 mg half tablet once a day, Lopressor 50 mg twice daily, Nitrostat 0.4 mg as directed, NovoLog sliding scale, Niacin 500 mg 5 tablets once a day, Travatan C drops 0.04% and Ranexa 1000 mg every 12 hours.   ALLERGIES: The patient is allergic to [employer].   PAST SURGICAL HISTORY: Appendectomy.   SOCIAL HISTORY: The patient is married, lives with his wife. He does not drink. He used to smoke, but he quit 17 years ago. He works at [employer].   FAMILY HISTORY: His mother had coronary artery disease and throat cancer. His father also had coronary artery disease. He had a brother, who died from significant overdose. No other cancers.   PHYSICAL EXAMINATION:  VITAL SIGNS: Blood pressure 138/57, pulse 82, respirations 12, temperature 97.6.  GENERAL: Alert and oriented x 3, in no acute distress. No respiratory distress. Hemodynamically stable.  HEENT: Pupils are equal and reactive. Extraocular movements are intact. Mucosa are moist. Anicteric sclerae. Pink conjunctivae. No oral lesions. No oropharyngeal exudates.  NECK: Supple. No JVD. No thyromegaly. No adenopathy. No carotid bruits.  CARDIOVASCULAR: Regular rate and rhythm. No murmurs, rubs or gallops. No displacement of PMI. No tenderness to palpation of  anterior chest wall.  LUNGS: Clear without any wheezing or crepitus. No use of accessory muscles.  ABDOMEN: Soft, nontender, nondistended. No hepatosplenomegaly. No masses. Bowel sounds are positive.  GENITAL: Deferred.  EXTREMITIES: No edema, cyanosis or clubbing. Pulses +2. Capillary refill less than 3.  MUSCULOSKELETAL: No significant joint abnormalities or joint deformity.  VASCULAR: Capillary refill less than 3 seconds, pulses are strong in all 4 extremities, +2.  NEUROLOGIC: Cranial nerves II through XII intact. Strength is 5 out of 5 in 4 extremities. Sensation is slightly decreased on the plantar area due to neuropathy, but overall intact on other places.  PSYCHIATRIC: No significant agitation or anxiety. Affect seems to be normal. Alert, oriented x 3.  RESULTS: Glucose 375, BUN 21, creatinine 1.1, potassium 3.9. Other electrolytes within normal limits. Troponin 0.02. White count 8.4, hemoglobin 12.8, platelet count 230. EKG: Unchanged from comparison with Dr. Alveria Apley with no ST depression or elevation and no acute findings of ischemia. Normal sinus rhythm. Chest x-ray: No acute cardiopulmonary problems.   ASSESSMENT AND PLAN:  52.  A 64 year old gentleman with history of significant coronary artery disease, very well known defects, likely he has significant stable angina. The episode today seems to be related to the hypoglycemia releasing his stress  hormones creating more work load to the heart. The patient was possibly tachycardic due to the hypoglycemia and anxiety to make him use or consume more oxygen on his myocardium. At this moment, there are no signs of myocardial injury for what we are going to him out with only cardiac enzymes. I am going ask Dr. Nehemiah Massed to see him just to make sure that he is aware of the situation to evaluate the possibility or problems. I do not know if he is a good candidate for CABG with the magnitude of his defects in the distal lesions, probably is not a  possibility, so we have to optimize his medical management. Continue beta blocker. Continue aspirin. Continue nitroglycerin as needed. Continue Ranexa. Have to see some diet and exercise. We are going to check his lipid profile as the patient is not taking his statins. He will benefit from statin if he can tolerate it.  2.  As far as his diabetes, continue to monitor closely. Decrease the dose of his Lantus as he continued to have hypoglycemia 2 days in a row and follow up closely with endocrinology.  3.  As far as hypertension, blood pressure seems to be stable. Continue metoprolol and chlorthalidone.  4.  As far as his sleep apnea, continue CPAP. We are going to try to discharge him today for what he can go back home and use his CPAP at home.   I spent about 45 minutes with this patient. DVT prophylaxis with Lovenox. I do not think that he needs a full dose right now, but if any of his cardiac enzymes come back positive, we will give him 1 mg per kg. GI prophylaxis with Protonix.    ____________________________ [place] Sink, MD rsg:aw D: 10/11/2013 08:15:47 ET T: 10/11/2013 08:45:40 ET JOB#: [id number]  cc: Strawn Sink, MD, <Dictator> [place] MD ELECTRONICALLY SIGNED 10/13/2013 18:17

## 2015-02-23 NOTE — Consult Note (Signed)
PATIENT NAME:  Lawrence [name], Lawrence [name] MR#:  [id number] DATE OF BIRTH:  06-Sep-1951  DATE OF CONSULTATION:  10/11/2013  REFERRING PHYSICIAN: Dr. Bridgette Habermann.  CONSULTING PHYSICIAN:  Corey Skains, MD  REASON FOR CONSULTATION: Acute chest pain with cardiovascular disease.   CHIEF COMPLAINT: "I have chest pain and palpitations."   HISTORY OF PRESENT ILLNESS: This 64 year old male with known coronary artery disease, hypertension, hyperlipidemia, diabetes who had an acute onset of low blood sugars in the middle of the night. He did treat this with appropriate food and had some improvements. Then, several hours later he had substernal chest pressure pain with some palpitations and pounding, and that pressure was diffuse with some amount of shortness of breath. This lasted for the last several hours with no evidence of significant new EKG changes or acute myocardial infarction. The patient has had a normal troponin. The patient does not have any significant apparent anemia or kidney disease at this time. The patient now has full relief of his chest discomfort and it is stable.    REVIEW OF SYSTEMS: The remainder of review of systems negative for vision change, ringing in the ears, hearing loss, cough, congestion, heartburn, nausea, vomiting, diarrhea, bloody stools, stomach pain, extremity pain, leg weakness, cramping in buttocks, known blood clots, headaches, blackouts, dizzy spells, nosebleeds, congestion, trouble swallowing, frequent urination, urination at night, muscle weakness, numbness, anxiety, depression, skin lesions, or skin rashes.   PAST MEDICAL HISTORY: 1. Coronary artery disease.  2. Hypertension.  3. Hyperlipidemia.  4. Diabetes.   FAMILY HISTORY: Early onset of father having cardiovascular disease.   SOCIAL HISTORY: Currently denies alcohol or tobacco use.   ALLERGIES: As listed.   MEDICATIONS: As listed.   PHYSICAL EXAMINATION: VITAL SIGNS: Blood pressure 122/68 bilaterally, heart  rate 72 upright, reclining, and regular.  GENERAL: He is a well appearing male in no acute distress.  HEENT: No icterus, thyromegaly, ulcers, hemorrhage, or xanthelasma.  CARDIOVASCULAR: Regular rate and rhythm. Normal S1 and S2 without murmur, gallop, or rub. PMI is normal size and placement. Carotid upstroke normal without bruit. Jugular venous pressure is normal.  LUNGS: Few basilar crackles with normal respirations.  ABDOMEN: Soft, nontender, without hepatosplenomegaly or masses. Abdominal aorta is normal size without bruit.  EXTREMITIES: 2+ radial, femoral, dorsal pedal pulses, with no lower extremity edema, cyanosis, clubbing, or ulcers.  NEUROLOGIC: He is oriented to time, place and person with normal mood and affect.   ASSESSMENT: A 64 year old male with hypertension, hyperlipidemia, diabetes, coronary artery disease with acute onset chest pressure, pain and shortness of breath consistent with unstable angina and/or Canadian Class IV anginal equivalent without current evidence of acute coronary syndrome or myocardial infarction.   RECOMMENDATIONS: 1. Admit to telemetry with telemetry unit nursing.  2. Serial ECG and enzymes to assess for possible myocardial infarction.  3. Possible echocardiogram for LV systolic dysfunction, valvular heart disease contributing to current Canadian Class IV anginal equivalent.  4. Continue medication management for diabetes, hypertension, hyperlipidemia, as outpatient medications.  5. Further diagnostic testing and treatment options after above.   ____________________________ Corey Skains, MD bjk:sg D: 10/11/2013 08:36:00 ET T: 10/11/2013 08:59:26 ET JOB#: [id number]  cc: Corey Skains, MD, <Dictator> Corey Skains MD ELECTRONICALLY SIGNED 10/11/2013 12:57

## 2015-03-26 ENCOUNTER — Ambulatory Visit: Payer: Medicare Other

## 2015-05-03 ENCOUNTER — Telehealth: Payer: Self-pay | Admitting: Podiatry

## 2015-05-03 NOTE — Telephone Encounter (Signed)
Pt called asking if a rx could be called in. I f you could please call pt back to get information. Pt did not want to leave a message.

## 2015-05-08 MED ORDER — METANX 3-35-2 MG PO TABS: 1.0000 | ORAL_TABLET | Freq: Two times a day (BID) | ORAL | Status: DC

## 2015-05-08 NOTE — Telephone Encounter (Signed)
Patient wanting brand name Metanx to be sent into [facility]. He has talked with his insurance about coverage. Sent in Metanx brand name to pharmacy as requested. Informed pt.

## 2015-05-09 ENCOUNTER — Ambulatory Visit (INDEPENDENT_AMBULATORY_CARE_PROVIDER_SITE_OTHER): Payer: Medicare Other | Admitting: Podiatry

## 2015-05-09 DIAGNOSIS — M79676 Pain in unspecified toe(s): Secondary | ICD-10-CM | POA: Diagnosis not present

## 2015-05-09 DIAGNOSIS — E1140 Type 2 diabetes mellitus with diabetic neuropathy, unspecified: Secondary | ICD-10-CM

## 2015-05-09 DIAGNOSIS — I25119 Atherosclerotic heart disease of native coronary artery with unspecified angina pectoris: Secondary | ICD-10-CM | POA: Diagnosis not present

## 2015-05-09 DIAGNOSIS — B351 Tinea unguium: Secondary | ICD-10-CM | POA: Diagnosis not present

## 2015-05-09 DIAGNOSIS — E1149 Type 2 diabetes mellitus with other diabetic neurological complication: Secondary | ICD-10-CM

## 2015-05-09 DIAGNOSIS — I214 Non-ST elevation (NSTEMI) myocardial infarction: Secondary | ICD-10-CM | POA: Diagnosis not present

## 2015-05-09 DIAGNOSIS — E119 Type 2 diabetes mellitus without complications: Secondary | ICD-10-CM | POA: Diagnosis not present

## 2015-05-09 DIAGNOSIS — I10 Essential (primary) hypertension: Secondary | ICD-10-CM | POA: Diagnosis not present

## 2015-05-09 MED ORDER — METANX 3-35-2 MG PO TABS: 1.0000 | ORAL_TABLET | Freq: Two times a day (BID) | ORAL | Status: DC

## 2015-05-09 NOTE — Progress Notes (Signed)
Patient ID: Lawrence [name], male   DOB: 19-Feb-1951, 64 y.o.   MRN: [id number]  Patient presents to the office today with a chief complaint of painful elongated toenails.  Objective: Pulses are palpable bilateral. Nails are thick yellow dystrophic clinically mycotic and painful palpation.  Assessment: Pain in limb secondary to onychomycosis 1 through 5 bilateral.  Plan: Debridement of nails 1 through 5 bilateral covered service secondary to pain.

## 2015-05-14 DIAGNOSIS — I10 Essential (primary) hypertension: Secondary | ICD-10-CM | POA: Diagnosis not present

## 2015-05-14 DIAGNOSIS — G4733 Obstructive sleep apnea (adult) (pediatric): Secondary | ICD-10-CM | POA: Diagnosis not present

## 2015-05-14 DIAGNOSIS — E119 Type 2 diabetes mellitus without complications: Secondary | ICD-10-CM | POA: Diagnosis not present

## 2015-05-15 ENCOUNTER — Ambulatory Visit: Payer: Medicare Other | Admitting: Podiatry

## 2015-05-17 ENCOUNTER — Ambulatory Visit: Payer: Medicare Other | Admitting: Podiatry

## 2015-05-18 DIAGNOSIS — I10 Essential (primary) hypertension: Secondary | ICD-10-CM | POA: Insufficient documentation

## 2015-05-22 DIAGNOSIS — R0602 Shortness of breath: Secondary | ICD-10-CM | POA: Diagnosis not present

## 2015-05-22 DIAGNOSIS — R600 Localized edema: Secondary | ICD-10-CM | POA: Insufficient documentation

## 2015-05-22 DIAGNOSIS — I25119 Atherosclerotic heart disease of native coronary artery with unspecified angina pectoris: Secondary | ICD-10-CM | POA: Diagnosis not present

## 2015-05-22 DIAGNOSIS — E782 Mixed hyperlipidemia: Secondary | ICD-10-CM | POA: Diagnosis not present

## 2015-05-22 DIAGNOSIS — I10 Essential (primary) hypertension: Secondary | ICD-10-CM | POA: Diagnosis not present

## 2015-06-08 DIAGNOSIS — G4733 Obstructive sleep apnea (adult) (pediatric): Secondary | ICD-10-CM | POA: Diagnosis not present

## 2015-07-24 DIAGNOSIS — I214 Non-ST elevation (NSTEMI) myocardial infarction: Secondary | ICD-10-CM | POA: Diagnosis not present

## 2015-07-24 DIAGNOSIS — I2510 Atherosclerotic heart disease of native coronary artery without angina pectoris: Secondary | ICD-10-CM | POA: Diagnosis not present

## 2015-07-24 DIAGNOSIS — G4733 Obstructive sleep apnea (adult) (pediatric): Secondary | ICD-10-CM | POA: Diagnosis not present

## 2015-07-24 DIAGNOSIS — E782 Mixed hyperlipidemia: Secondary | ICD-10-CM | POA: Diagnosis not present

## 2015-08-13 ENCOUNTER — Ambulatory Visit: Payer: Medicare Other

## 2015-08-21 ENCOUNTER — Ambulatory Visit (INDEPENDENT_AMBULATORY_CARE_PROVIDER_SITE_OTHER): Payer: Medicare Other | Admitting: Sports Medicine

## 2015-08-21 ENCOUNTER — Encounter: Payer: Self-pay | Admitting: Sports Medicine

## 2015-08-21 DIAGNOSIS — B351 Tinea unguium: Secondary | ICD-10-CM | POA: Diagnosis not present

## 2015-08-21 DIAGNOSIS — E1149 Type 2 diabetes mellitus with other diabetic neurological complication: Secondary | ICD-10-CM | POA: Diagnosis not present

## 2015-08-21 DIAGNOSIS — M79604 Pain in right leg: Secondary | ICD-10-CM

## 2015-08-21 DIAGNOSIS — M79673 Pain in unspecified foot: Secondary | ICD-10-CM | POA: Diagnosis not present

## 2015-08-21 DIAGNOSIS — M79605 Pain in left leg: Secondary | ICD-10-CM

## 2015-08-21 NOTE — Progress Notes (Signed)
Patient ID: DAIMION ADAMCIK, male   DOB: 27-May-1951, 64 y.o.   MRN: [id number] Subjective: Lawrence [name] is a 64 y.o. male patient with history of type 2 diabetes who returns to office today complaining of long, painful nails  while ambulating in shoes. Patient states that the glucose reading this morning was 142 mg/dl. Diagnosed with DM 44years ago. Patient denies any new changes in medication or new problems. Patient denies any new cramping, numbness, burning or tingling in the legs. Admits to unchanged tingling in feet of which was treated at [facility] with Gabapentin.   There are no active problems to display for this patient.  Current Outpatient Prescriptions on File Prior to Visit  Medication Sig Dispense Refill  . amoxicillin-clavulanate (AUGMENTIN) 875-125 MG per tablet Take 1 tablet by mouth 2 (two) times daily. 20 tablet 1  . cephALEXin (KEFLEX) 500 MG capsule Take 1 capsule (500 mg total) by mouth 2 (two) times daily. 30 capsule 0  . multivitamin (METANX) 3-35-2 MG TABS tablet Take 1 tablet by mouth 2 (two) times daily. 60 tablet 3   No current facility-administered medications on file prior to visit.   Allergies  Allergen Reactions  . Isosorbide Nausea And Vomiting  . Statins Other (See Comments)    BODY PAIN  . Lipitor [Atorvastatin] Other (See Comments) and Rash    BODY PAIN    Labs:HEMOGLOBIN A1C- No recent lab on file  Objective: General: Patient is awake, alert, and oriented x 3 and in no acute distress.  Integument: Skin is warm, dry and supple bilateral. Nails are tender, long, thickened and  dystrophic with subungual debris, consistent with onychomycosis, 1-5 bilateral; dry blood is noted under right 2nd toenail. No signs of infection. No open lesions or preulcerative lesions present bilateral. Remaining integument unremarkable.  Vasculature:  Dorsalis Pedis pulse 2/4 bilateral. Posterior Tibial pulse  1/4 bilateral.  Capillary fill time <3 sec 1-5 bilateral. Scant hair  growth to the level of the digits. Temperature gradient within normal limits. No varicosities present bilateral. 1+ pitting edema present bilateral.   Neurology: The patient has intact sensation measured with a 5.07/10g Semmes Weinstein Monofilament at all pedal sites bilateral . Vibratory sensation diminished bilateral with tuning fork. No Babinski sign present bilateral.   Musculoskeletal: No gross pedal deformities noted bilateral. Muscular strength 5/5 in all lower extremity muscular groups bilateral without pain or limitation on range of motion . No tenderness with calf compression bilateral.  Assessment and Plan: Problem List Items Addressed This Visit    None    Visit Diagnoses    Dermatophytosis of nail    -  Primary    Pain in both lower extremities        Type II diabetes mellitus with neurological manifestations ([place])           -Examined patient. -Discussed and educated patient on diabetic foot care, especially with  regards to the vascular, neurological and musculoskeletal systems.  -Stressed the importance of good glycemic control and the detriment of not  controlling glucose levels in relation to the foot. -Mechanically debrided all nails 1-5 bilateral using sterile nail nipper and filed with dremel without incident  -Advised diet modification and elevation to assist with lower extremity edema; will monitor consider compression stocking if no contraindication present   -[facility]; Encouraged good glycemic control to help with symptoms.  -Answered all patient questions -Patient to returnin 3 months for diabetic foot care -Patient advised to  call the office if any problems or questions arise in the  Meantime.  Landis Martins, DPM

## 2015-08-23 ENCOUNTER — Encounter (INDEPENDENT_AMBULATORY_CARE_PROVIDER_SITE_OTHER): Payer: Self-pay

## 2015-08-28 ENCOUNTER — Encounter: Payer: Self-pay | Admitting: Primary Care

## 2015-08-28 ENCOUNTER — Ambulatory Visit (INDEPENDENT_AMBULATORY_CARE_PROVIDER_SITE_OTHER): Payer: Medicare Other | Admitting: Primary Care

## 2015-08-28 VITALS — BP 126/72 | HR 70 | Temp 97.7°F | Ht 71.0 in | Wt 318.8 lb

## 2015-08-28 DIAGNOSIS — I2510 Atherosclerotic heart disease of native coronary artery without angina pectoris: Secondary | ICD-10-CM | POA: Insufficient documentation

## 2015-08-28 DIAGNOSIS — E118 Type 2 diabetes mellitus with unspecified complications: Secondary | ICD-10-CM

## 2015-08-28 DIAGNOSIS — I25119 Atherosclerotic heart disease of native coronary artery with unspecified angina pectoris: Secondary | ICD-10-CM | POA: Diagnosis not present

## 2015-08-28 DIAGNOSIS — R600 Localized edema: Secondary | ICD-10-CM

## 2015-08-28 DIAGNOSIS — K219 Gastro-esophageal reflux disease without esophagitis: Secondary | ICD-10-CM

## 2015-08-28 DIAGNOSIS — G4733 Obstructive sleep apnea (adult) (pediatric): Secondary | ICD-10-CM | POA: Insufficient documentation

## 2015-08-28 DIAGNOSIS — E785 Hyperlipidemia, unspecified: Secondary | ICD-10-CM

## 2015-08-28 DIAGNOSIS — Z6841 Body Mass Index (BMI) 40.0 and over, adult: Secondary | ICD-10-CM

## 2015-08-28 DIAGNOSIS — E109 Type 1 diabetes mellitus without complications: Secondary | ICD-10-CM | POA: Insufficient documentation

## 2015-08-28 DIAGNOSIS — Z794 Long term (current) use of insulin: Secondary | ICD-10-CM

## 2015-08-28 NOTE — Assessment & Plan Note (Signed)
Manages with [facility]. Currently managed on plant sterols as he cannot tolerate statins.

## 2015-08-28 NOTE — Assessment & Plan Note (Signed)
Obesity. Currently managed on CPAP HS.

## 2015-08-28 NOTE — Assessment & Plan Note (Addendum)
Managed by [facility]. Average sugar readings are anywhere from 60-upper 200's. Multiple low readings during October. Currently managed on Novolog 20 units TID and Lanuts 15 units HS. BS in clinic today of 47, increased to 79 after snack. Suspect he's taking too much insulin. I recommended he notify the [facility] immediately of his lower readings and also to decrease his Novolog to 15 units TID. Last A1C was in October 2016 per [facility], unsure of result. Discussed the importance of weight loss through healthy diet and exercise. Also discussed to keep snacks on himself when in public in case of hypoglycemia.

## 2015-08-28 NOTE — Assessment & Plan Note (Signed)
Managed on prilosec with improvement.

## 2015-08-28 NOTE — Patient Instructions (Signed)
Decrease your Novolog to 15 units three times daily. Continue Lantus at 15 units at bedtime.  Please call the [facility] and notify them of your sugars dropping. Please call cardiology and notify them of your lower extremity edema.  It is important that you improve your diet. Please limit carbohydrates in the form of white bread, rice, pasta, ice cream, etc. Increase your consumption of fresh fruits and vegetables.  Start exercising. Start by walking for 30 minutes daily. Then work up to walking 30 minutes twice daily. Then work up to walking 1 hour twice daily.   It was a pleasure to meet you today! Please don't hesitate to call me with any questions. Welcome to [facility]!

## 2015-08-28 NOTE — Progress Notes (Signed)
Pre visit review using our clinic review tool, if applicable. No additional management support is needed unless otherwise documented below in the visit note. 

## 2015-08-28 NOTE — Assessment & Plan Note (Signed)
Spent a long time dissecting his diet and discussing room for improvement. He struggles with portion sizes and sweets. Offered referral to nutritionist for encouragement and direction, he declined. Discussed importance of exercise and composed a regimen for him to try.

## 2015-08-28 NOTE — Progress Notes (Signed)
Subjective:    Patient ID: Lawrence [name], male    DOB: 1951-05-09, 64 y.o.   MRN: [id number]  HPI  Lawrence [name] is a 64 year old male who presents today to establish care and discuss the problems mentioned below. Will review old records. He is currently managed for his chronic health problems at the [facility], but would like to establish care in our clinic for acute issues.  1) Type 2 Diabetes: Diagnosed in December 1972. Currently managed on 20 units three times daily of Novolog and 15 units of Lanuts at bedtime. He checks his sugars 4-8 times daily and will get readings averaging between 120-250's. He is following with the [facility] for diabetes and they provide his supplies and medications. He has a follow up appointment with Dr. Woodroe Mode in January 2017, his last visit was several weeks ago without changes to his medications. He was initiated on lisinopril per cardiology but patient is not taking as he could not tolerate/"function". 2-3 low blood sugar numbers of 60 or less in October. He started to feel unsteady during examination today, check his sugar which was 47. His wife provided him with a small candy bar which increased his sugar to 79 upon leaving today. History of diabetic neuropathy. Last A1C was October 3rd and does not recall the results.   He endorses a fair diet which consists of: Breakfast: Oatmeal, cheese toast, buttered toast Lunch: Sandwich, soup, salads, eating out ([place], cracker barrel-tries to make healthier choices) Dinner: Home cooked meals (meatloaf, lean meats, veggies, salads) Desserts: Ice cream, fiber one bar, granola bars. Eats them everyday. Snacks: Popcorn Beverages: Water mostly, coffee, green tea, unsweet tea Large portion sizes and has difficulty controlling.  He is not currently exercising.   2) CAD: Currently managed on ranexa 1000 mg SR, metoprolol tartate 25 mg BID, and lasix 20 mg. History of NSTEMI. 2D echocardiogram completed in July 2016 through  [facility]. LVEF of 50%, mild mitral and tricuspid valve regurgitation. He is seeing Dr. Nehemiah Massed through [facility] and will see him again in January 2017. He was initiated on lisinopril last visit but is no longer taking as he could not tolerate. Currently managed on lasix 20 mg and metoprolol tartate 25 mg BID. Denies chest pain, shortness of breath. He does have moderate lower extremity edema to left side.  3) Sleep Apnea: Currently sleeps with CPAP machine.   4) Hyperlipidemia: Currently managed on plant sterols as he cannot tolerate statins. Last lipid panel was through the [facility].  5) GERD: Managed on Prilosec with improvement in pain. Symptoms included reflux of gastric contents and epigastric discomfort/tightness.   Review of Systems  Constitutional: Negative for unexpected weight change.  HENT: Negative for rhinorrhea.   Respiratory: Negative for cough and shortness of breath.   Cardiovascular: Negative for chest pain.  Gastrointestinal: Negative for diarrhea and constipation.  Genitourinary: Negative for difficulty urinating.  Musculoskeletal:       History of osteoarthritis.  Skin: Negative for rash.       Healing wound to left lower extremity, medial side of calf.  Neurological: Positive for numbness. Negative for dizziness and headaches.       Past Medical History  Diagnosis Date  . Muscle pain   . Sinus complaint   . Dizzy   . Glaucoma   . Osteoarthritis   . Swelling   . Diabetes ([place])   . Fatigue   . Night sweats   . Poor circulation   .  HBP (high blood pressure)   . Heart attack ([place])   . Chest pain   . Skin cancer   . COPD, mild ([place])   . Bruises easily     Social History   Social History  . Marital Status: Married    Spouse Name: N/A  . Number of Children: N/A  . Years of Education: N/A   Occupational History  . Not on file.   Social History Main Topics  . Smoking status: Never Smoker   . Smokeless tobacco: Never Used  .  Alcohol Use: Yes     Comment: OCCASIONALLY  . Drug Use: No  . Sexual Activity: Not on file   Other Topics Concern  . Not on file   Social History Narrative    Past Surgical History  Procedure Laterality Date  . Eye surgery Bilateral   . Appendectomy      No family history on file.  Allergies  Allergen Reactions  . Isosorbide Nausea And Vomiting  . Statins Other (See Comments)    BODY PAIN  . Lipitor [Atorvastatin] Other (See Comments) and Rash    BODY PAIN    Current Outpatient Prescriptions on File Prior to Visit  Medication Sig Dispense Refill  . multivitamin (METANX) 3-35-2 MG TABS tablet Take 1 tablet by mouth 2 (two) times daily. 60 tablet 3   No current facility-administered medications on file prior to visit.    BP 126/72 mmHg  Pulse 70  Temp(Src) 97.7 F (36.5 C) (Oral)  Ht 5\' 11"  (1.803 m)  Wt 318 lb 12.8 oz (144.607 kg)  BMI 44.48 kg/m2  SpO2 97%    Objective:   Physical Exam  Constitutional: He is oriented to person, place, and time. He appears well-nourished.  Cardiovascular: Normal rate and regular rhythm.   Pulses:      Dorsalis pedis pulses are 2+ on the right side, and 2+ on the left side.       Posterior tibial pulses are 2+ on the right side, and 2+ on the left side.  Moderate left lower extremity edema without pitting.  Pulmonary/Chest: Effort normal and breath sounds normal.  Musculoskeletal: He exhibits edema.  Neurological: He is alert and oriented to person, place, and time.  Skin: Skin is warm and dry.  Properly healing wound to left lower extremity.  Psychiatric: He has a normal mood and affect.          Assessment & Plan:

## 2015-08-28 NOTE — Assessment & Plan Note (Signed)
History of, following with Dr. Nehemiah Massed with [facility]. Managed on lasix 20 mg. Moderate edema today without pitting. 2D echo reviewed from July 2016. Encouraged him to discuss his increased edema with Dr. Nehemiah Massed. Denies SOB.

## 2015-08-28 NOTE — Assessment & Plan Note (Signed)
NSTEMI history. Currently following with Dr. Jannetta Quint with [facility] who is managing lower extremity edema and angina. Managed on lasix, ranexa, metoprolol tartate. Last visit due in January 2017. Denies chest pain. Notes and recent 2D echocardiogram reviewed.

## 2015-09-01 ENCOUNTER — Emergency Department
Admission: EM | Admit: 2015-09-01 | Discharge: 2015-09-01 | Disposition: A | Discharge status: Home / Self Care | Payer: Medicare Other | Attending: Emergency Medicine | Admitting: Emergency Medicine

## 2015-09-01 ENCOUNTER — Emergency Department: Payer: Medicare Other

## 2015-09-01 ENCOUNTER — Encounter: Payer: Self-pay | Admitting: Emergency Medicine

## 2015-09-01 DIAGNOSIS — Y998 Other external cause status: Secondary | ICD-10-CM | POA: Insufficient documentation

## 2015-09-01 DIAGNOSIS — W010XXA Fall on same level from slipping, tripping and stumbling without subsequent striking against object, initial encounter: Secondary | ICD-10-CM | POA: Diagnosis not present

## 2015-09-01 DIAGNOSIS — Y9301 Activity, walking, marching and hiking: Secondary | ICD-10-CM | POA: Insufficient documentation

## 2015-09-01 DIAGNOSIS — S52592A Other fractures of lower end of left radius, initial encounter for closed fracture: Secondary | ICD-10-CM | POA: Insufficient documentation

## 2015-09-01 DIAGNOSIS — Z794 Long term (current) use of insulin: Secondary | ICD-10-CM | POA: Diagnosis not present

## 2015-09-01 DIAGNOSIS — E119 Type 2 diabetes mellitus without complications: Secondary | ICD-10-CM | POA: Diagnosis not present

## 2015-09-01 DIAGNOSIS — Y9289 Other specified places as the place of occurrence of the external cause: Secondary | ICD-10-CM | POA: Insufficient documentation

## 2015-09-01 DIAGNOSIS — Z79899 Other long term (current) drug therapy: Secondary | ICD-10-CM | POA: Insufficient documentation

## 2015-09-01 DIAGNOSIS — Z791 Long term (current) use of non-steroidal anti-inflammatories (NSAID): Secondary | ICD-10-CM | POA: Insufficient documentation

## 2015-09-01 DIAGNOSIS — S52502A Unspecified fracture of the lower end of left radius, initial encounter for closed fracture: Secondary | ICD-10-CM

## 2015-09-01 DIAGNOSIS — S6992XA Unspecified injury of left wrist, hand and finger(s), initial encounter: Secondary | ICD-10-CM | POA: Diagnosis present

## 2015-09-01 MED ORDER — HYDROCODONE-ACETAMINOPHEN 5-325 MG PO TABS
ORAL_TABLET | ORAL | Status: AC
  Administered 2015-09-01: 1 via ORAL
  Filled 2015-09-01: qty 1

## 2015-09-01 MED ORDER — HYDROCODONE-ACETAMINOPHEN 5-325 MG PO TABS
1.0000 | ORAL_TABLET | Freq: Once | ORAL | Status: AC
  Administered 2015-09-01: 1 via ORAL

## 2015-09-01 MED ORDER — HYDROCODONE-ACETAMINOPHEN 5-325 MG PO TABS: 1.0000 | ORAL_TABLET | ORAL | Status: DC | PRN

## 2015-09-01 NOTE — ED Notes (Signed)
States he fell this am pain to left wrist area

## 2015-09-01 NOTE — ED Provider Notes (Signed)
CSN: [id number]     Arrival date & time 09/01/15  1008 History   First MD Initiated Contact with Patient 09/01/15 1018     Chief Complaint  Patient presents with  . Fall     (Consider location/radiation/quality/duration/timing/severity/associated sxs/prior Treatment) HPI Comments: 48 year right hand dominant old male presents today complaining of left wrist pain secondary to a fall that occurred just prior to arrival. Pt reports that he was walking in his yard with a hedge trimmer in his right hand when his cell phone rang and he tripped stepping down. He fell with an outstretched left hand. Did not hit his head or lose consciousness. No other injuries. No history of prior wrist injury. Took 650mg  of Tylenol at 9am   Patient is a 64 y.o. male presenting with fall.  Fall Associated symptoms include arthralgias and myalgias.    Past Medical History  Diagnosis Date  . Muscle pain   . Sinus complaint   . Dizzy   . Glaucoma   . Osteoarthritis   . Swelling   . Diabetes ([place])   . Fatigue   . Night sweats   . Poor circulation   . HBP (high blood pressure)   . Heart attack ([place])   . Chest pain   . Skin cancer   . COPD, mild ([place])   . Bruises easily    Past Surgical History  Procedure Laterality Date  . Eye surgery Bilateral   . Appendectomy     Family History  Problem Relation Age of Onset  . Hypertension Mother   . Lupus Mother   . Hypertension Father   . Diabetes Father   . Arthritis Maternal Grandmother   . Arthritis Maternal Grandfather   . Arthritis Paternal Grandmother   . Arthritis Paternal Grandfather    Social History  Substance Use Topics  . Smoking status: Never Smoker   . Smokeless tobacco: Never Used  . Alcohol Use: Yes     Comment: OCCASIONALLY    Review of Systems  Musculoskeletal: Positive for myalgias and arthralgias.  Skin: Negative for wound.  All other systems reviewed and are negative.     Allergies  Isosorbide; Statins; and  Lipitor  Home Medications   Prior to Admission medications   Medication Sig Start Date End Date Taking? Authorizing Provider  cetirizine (ZYRTEC) 10 MG tablet Take 10 mg by mouth daily.    Historical Provider, MD  docusate sodium (COLACE) 100 MG capsule Take 200 mg by mouth 2 (two) times daily.    Historical Provider, MD  furosemide (LASIX) 20 MG tablet Take by mouth. 05/22/15 05/21/16  Historical Provider, MD  glucagon (GLUCAGON EMERGENCY) 1 MG injection Inject 1 mg into the vein once as needed.    Historical Provider, MD  HYDROcodone-acetaminophen (NORCO/VICODIN) 5-325 MG tablet Take 1 tablet by mouth every 4 (four) hours as needed for moderate pain. 09/01/15   Corliss Parish, PA-C  insulin aspart (NOVOLOG) 100 UNIT/ML injection Inject 20 Units into the skin 3 (three) times daily.     Historical Provider, MD  insulin glargine (LANTUS) 100 UNIT/ML injection Inject 15 Units into the skin at bedtime.     Historical Provider, MD  latanoprost (XALATAN) 0.005 % ophthalmic solution Place 1 drop into both eyes at bedtime.    Historical Provider, MD  meloxicam (MOBIC) 15 MG tablet Take by mouth.    Historical Provider, MD  metoprolol tartrate (LOPRESSOR) 25 MG tablet Take 25 mg by mouth 2 (two) times daily.  Historical Provider, MD  Multiple Vitamin (MULTIVITAMIN) capsule Take 1 capsule by mouth daily.    Historical Provider, MD  multivitamin (METANX) 3-35-2 MG TABS tablet Take 1 tablet by mouth 2 (two) times daily. 05/09/15   Max T Hyatt, DPM  omeprazole (PRILOSEC) 20 MG capsule Take 20 mg by mouth daily.    Historical Provider, MD  Plant Sterols and Stanols (CHOLESTOFF PO) Take 2 capsules by mouth 2 (two) times daily.    Historical Provider, MD  ranolazine (RANEXA) 1000 MG SR tablet TAKE 1 TABLET TWICE A DAY 02/28/15   Historical Provider, MD  vitamin C (ASCORBIC ACID) 500 MG tablet Take 500 mg by mouth daily.    Historical Provider, MD   BP 127/98 mmHg  Pulse 80  Temp(Src) 98 F (36.7 C) (Oral)   Resp 20  Ht 5\' 11"  (1.803 m)  Wt 318 lb (144.244 kg)  BMI 44.37 kg/m2  SpO2 95% Physical Exam  Constitutional: He is oriented to person, place, and time. He appears well-developed and well-nourished.  HENT:  Head: Normocephalic and atraumatic.  Cardiovascular: Intact distal pulses and normal pulses.   Pulses:      Radial pulses are 2+ on the right side, and 2+ on the left side.  Musculoskeletal: He exhibits tenderness.       Right wrist: Normal.       Left wrist: He exhibits decreased range of motion, tenderness, bony tenderness and swelling. He exhibits no effusion, no crepitus and no deformity.  Diffuse tenderness to palpation over distal radius and ulna.   Neurological: He is alert and oriented to person, place, and time.  Skin: Skin is warm and dry.  Psychiatric: He has a normal mood and affect. His behavior is normal. Judgment and thought content normal.  Nursing note and vitals reviewed.   ED Course  Procedures (including critical care time) Labs Review Labs Reviewed - No data to display  Imaging Review Dg Wrist Complete Left  09/01/2015  CLINICAL DATA:  Swelling and pain and decreased range of motion after a fall this morning. EXAM: LEFT WRIST - COMPLETE 3+ VIEW COMPARISON:  None. FINDINGS: There is a subtle impaction fracture of the metaphysis of the distal left radius. There may be a hairline fracture into the articular surface. Distal ulna and carpal bones are intact. IMPRESSION: Subtle impaction fracture of the distal radius. Electronically Signed   By: Lorriane Shire M.D.   On: 09/01/2015 10:42   I have personally reviewed and evaluated these images and lab results as part of my medical decision-making.   EKG Interpretation None      MDM  1 Norco 5-325mg  given after evaluation of patient, left wrist XRAY ordered  Colles splint left wrist applied by nursing Norco 5-325 #20 no refills. Referred pt to Dr. Roland Rack, keep splint in place until evaluated by ortho. Return  for new or worsening problem.  Final diagnoses:  Distal radius fracture, left, closed, initial encounter        Corliss Parish, PA-C 09/01/15 1049  Nance Pear, MD 09/01/15 1133

## 2015-09-01 NOTE — ED Notes (Signed)
Pt verbalizes understanding of discharge instructions.

## 2015-09-03 ENCOUNTER — Telehealth: Payer: Self-pay | Admitting: Primary Care

## 2015-09-03 DIAGNOSIS — S62102S Fracture of unspecified carpal bone, left wrist, sequela: Secondary | ICD-10-CM

## 2015-09-03 NOTE — Telephone Encounter (Signed)
Patient called to tell you that he had a fall on Saturday and went to [facility] ER. He has a left Distal radius fracture. They told him to call Dr Roland Rack at [facility] to be seen. He would like [place] to call them for him and try to get an appt today if we can. Please put referral in Epic and we will call them to get appt made. Notes are in Epic.

## 2015-09-03 NOTE — Telephone Encounter (Signed)
Referral placed.

## 2015-09-04 DIAGNOSIS — R601 Generalized edema: Secondary | ICD-10-CM | POA: Diagnosis not present

## 2015-09-07 DIAGNOSIS — S52502A Unspecified fracture of the lower end of left radius, initial encounter for closed fracture: Secondary | ICD-10-CM | POA: Diagnosis not present

## 2015-09-07 DIAGNOSIS — M25532 Pain in left wrist: Secondary | ICD-10-CM | POA: Diagnosis not present

## 2015-09-10 ENCOUNTER — Other Ambulatory Visit: Payer: Self-pay | Admitting: *Deleted

## 2015-09-10 MED ORDER — METANX 3-35-2 MG PO TABS: 1.0000 | ORAL_TABLET | Freq: Two times a day (BID) | ORAL | Status: DC

## 2015-09-21 DIAGNOSIS — M25539 Pain in unspecified wrist: Secondary | ICD-10-CM | POA: Diagnosis not present

## 2015-09-21 DIAGNOSIS — S52501D Unspecified fracture of the lower end of right radius, subsequent encounter for closed fracture with routine healing: Secondary | ICD-10-CM | POA: Diagnosis not present

## 2015-09-21 DIAGNOSIS — S52502A Unspecified fracture of the lower end of left radius, initial encounter for closed fracture: Secondary | ICD-10-CM | POA: Diagnosis not present

## 2015-10-12 DIAGNOSIS — S52501D Unspecified fracture of the lower end of right radius, subsequent encounter for closed fracture with routine healing: Secondary | ICD-10-CM | POA: Diagnosis not present

## 2015-10-12 DIAGNOSIS — M25532 Pain in left wrist: Secondary | ICD-10-CM | POA: Diagnosis not present

## 2015-10-18 DIAGNOSIS — M1712 Unilateral primary osteoarthritis, left knee: Secondary | ICD-10-CM | POA: Diagnosis not present

## 2015-10-18 DIAGNOSIS — M25562 Pain in left knee: Secondary | ICD-10-CM | POA: Diagnosis not present

## 2015-10-23 ENCOUNTER — Ambulatory Visit (INDEPENDENT_AMBULATORY_CARE_PROVIDER_SITE_OTHER): Payer: Medicare Other | Admitting: Sports Medicine

## 2015-10-23 ENCOUNTER — Encounter: Payer: Self-pay | Admitting: Sports Medicine

## 2015-10-23 DIAGNOSIS — L02611 Cutaneous abscess of right foot: Secondary | ICD-10-CM | POA: Diagnosis not present

## 2015-10-23 DIAGNOSIS — L03031 Cellulitis of right toe: Secondary | ICD-10-CM | POA: Diagnosis not present

## 2015-10-23 DIAGNOSIS — L600 Ingrowing nail: Secondary | ICD-10-CM | POA: Diagnosis not present

## 2015-10-23 DIAGNOSIS — E1149 Type 2 diabetes mellitus with other diabetic neurological complication: Secondary | ICD-10-CM

## 2015-10-23 DIAGNOSIS — I25119 Atherosclerotic heart disease of native coronary artery with unspecified angina pectoris: Secondary | ICD-10-CM

## 2015-10-23 MED ORDER — CEPHALEXIN 500 MG PO CAPS: 500.0000 mg | ORAL_CAPSULE | Freq: Two times a day (BID) | ORAL | Status: DC

## 2015-10-23 NOTE — Progress Notes (Signed)
Patient ID: Lawrence [name], male   DOB: 1951-02-12, 64 y.o.   MRN: [id number] Subjective: GERAL MALPASS is a 64 y.o.  diabetic male patient presents to office today complaining of a painful incurvated, red, hot, swollen medial nail border of the 1st toe on the right foot. This has been present for 1 day or so noticed after removing compression stockings. Patient and wife immediately called office for an appointment this morning. Patient denies fever/chills/nausea/vomitting/any other related constitutional symptoms at this time.  FBS 185mg /dl   Patient Active Problem List   Diagnosis Date Noted  . Hyperlipidemia 08/28/2015  . Type 2 diabetes mellitus ([place]) 08/28/2015  . CAD (coronary artery disease) 08/28/2015  . Lower extremity edema 08/28/2015  . GERD (gastroesophageal reflux disease) 08/28/2015  . OSA (obstructive sleep apnea) 08/28/2015  . Morbid obesity with BMI of 40.0-44.9, adult ([place]) 08/28/2015   Current Outpatient Prescriptions on File Prior to Visit  Medication Sig Dispense Refill  . cetirizine (ZYRTEC) 10 MG tablet Take 10 mg by mouth daily.    [name] docusate sodium (COLACE) 100 MG capsule Take 200 mg by mouth 2 (two) times daily.    . furosemide (LASIX) 20 MG tablet Take by mouth.    [name] glucagon (GLUCAGON EMERGENCY) 1 MG injection Inject 1 mg into the vein once as needed.    [name] HYDROcodone-acetaminophen (NORCO/VICODIN) 5-325 MG tablet Take 1 tablet by mouth every 4 (four) hours as needed for moderate pain. 20 tablet 0  . insulin aspart (NOVOLOG) 100 UNIT/ML injection Inject 20 Units into the skin 3 (three) times daily.     . insulin glargine (LANTUS) 100 UNIT/ML injection Inject 15 Units into the skin at bedtime.     [name] latanoprost (XALATAN) 0.005 % ophthalmic solution Place 1 drop into both eyes at bedtime.    . meloxicam (MOBIC) 15 MG tablet Take by mouth.    . metoprolol tartrate (LOPRESSOR) 25 MG tablet Take 25 mg by mouth 2 (two) times daily.    . Multiple Vitamin  (MULTIVITAMIN) capsule Take 1 capsule by mouth daily.    . multivitamin (METANX) 3-35-2 MG TABS tablet Take 1 tablet by mouth 2 (two) times daily. 60 tablet 3  . omeprazole (PRILOSEC) 20 MG capsule Take 20 mg by mouth daily.    . Plant Sterols and Stanols (CHOLESTOFF PO) Take 2 capsules by mouth 2 (two) times daily.    . ranolazine (RANEXA) 1000 MG SR tablet TAKE 1 TABLET TWICE A DAY    . vitamin C (ASCORBIC ACID) 500 MG tablet Take 500 mg by mouth daily.     No current facility-administered medications on file prior to visit.   Allergies  Allergen Reactions  . Isosorbide Nausea And Vomiting and Other (See Comments)  . Statins Other (See Comments)    BODY PAIN  . Lipitor [Atorvastatin] Other (See Comments) and Rash    BODY PAIN    Objective:  General: Well developed, nourished, in no acute distress, alert and oriented x3   Dermatology: Skin is warm, dry and supple bilateral. Right hallux appears to be  severely incurvated with hyperkeratosis formation at the distal aspects of  the medial  nail border and surrounding blistered skin at tip of toe that is considered pre-ulcerative. (+) Erythema. (+) Edema. (-) serosanguous drainage present. The remaining nails appear unremarkable/not ingrown at this time. There are no open sores, lesions or other signs of infection present.  Vascular: Dorsalis Pedis artery 2/4 and Posterior Tibial artery pedal pulses are  1/4 bilateral with immedate capillary fill time. Scant pedal hair growth present. No lower extremity edema.   Neruologic: Grossly intact via light touch bilateral.  Musculoskeletal: There is tenderness to palpation of the right hallux medial nail fold. Muscular strength within normal limits in all groups bilateral.   Assesement and Plan: Problem List Items Addressed This Visit    None    Visit Diagnoses    Nail, ingrown    -  Primary    right hallux medial margin    Cellulitis and abscess of toe, right        Relevant Medications     cephALEXin (KEFLEX) 500 MG capsule      -Discussed treatment alternatives and plan of care; Explained permanent/temporary nail avulsion and post procedure course to patient. - After a verbal consent, injected 3 ml of a 50:50 mixture of 2% plain lidocaine and 0.5% plain marcaine in a normal hallux block fashion. Next, a betadine prep was performed. Anesthesia was tested and found to be appropriate. The offending right hallux medial nail border was then incised from the hyponychium to the epinychium. The offending nail border was removed and cleared from the field. The area was curretted for any remaining nail or spicules. All surrounding blistered skin was removed with tissue nipper. Phenol application performed and the area was then flushed with alcohol and dressed with triple antibiotic cream and a dry sterile dressing. -Rx Keflex 500mg  preventative for erythema and blister to toe concerning for infection -Patient was instructed to leave the dressing intact for today and begin soaking  in a weak solution of betadine or epsom salt and water tomorrow. Patient was instructed to  soak for 15 minutes each day and apply neosporin and a gauze or bandaid dressing each day. -Patient was instructed to monitor the toe for signs of infection and return to office if toe becomes red, hot or swollen. -Patient is to return in 1 week for follow up care or sooner if problems arise.  Landis Martins, DPM

## 2015-10-23 NOTE — Patient Instructions (Signed)

## 2015-10-30 ENCOUNTER — Ambulatory Visit (INDEPENDENT_AMBULATORY_CARE_PROVIDER_SITE_OTHER): Payer: Medicare Other | Admitting: Sports Medicine

## 2015-10-30 ENCOUNTER — Encounter: Payer: Self-pay | Admitting: Sports Medicine

## 2015-10-30 DIAGNOSIS — L02611 Cutaneous abscess of right foot: Secondary | ICD-10-CM

## 2015-10-30 DIAGNOSIS — L600 Ingrowing nail: Secondary | ICD-10-CM

## 2015-10-30 DIAGNOSIS — E1149 Type 2 diabetes mellitus with other diabetic neurological complication: Secondary | ICD-10-CM

## 2015-10-30 DIAGNOSIS — L03031 Cellulitis of right toe: Secondary | ICD-10-CM

## 2015-10-30 NOTE — Progress Notes (Signed)
Patient ID: Lawrence [name], male   DOB: Sep 10, 1951, 64 y.o.   MRN: [id number] Subjective: Lawrence [name] is a 64 y.o.  Diabetic male patient returns to office today for follow up evaluation after having Right Hallux medial permanent nail avulsion performed on 10-23-15. Patient has been soaking using epsom salt and applying topical antibiotic covered with bandaid daily. Patient also taking Keflex with no adverse reaction. Denies fever/chills/nausea/vomitting/any other related constitutional symptoms at this time.  FBS this afternoon 275mg /dl.  Patient Active Problem List   Diagnosis Date Noted  . Hyperlipidemia 08/28/2015  . Type 2 diabetes mellitus ([place]) 08/28/2015  . CAD (coronary artery disease) 08/28/2015  . Lower extremity edema 08/28/2015  . GERD (gastroesophageal reflux disease) 08/28/2015  . OSA (obstructive sleep apnea) 08/28/2015  . Morbid obesity with BMI of 40.0-44.9, adult ([place]) 08/28/2015   Current Outpatient Prescriptions on File Prior to Visit  Medication Sig Dispense Refill  . cephALEXin (KEFLEX) 500 MG capsule Take 1 capsule (500 mg total) by mouth 2 (two) times daily. 20 capsule 0  . cetirizine (ZYRTEC) 10 MG tablet Take 10 mg by mouth daily.    [name] docusate sodium (COLACE) 100 MG capsule Take 200 mg by mouth 2 (two) times daily.    . furosemide (LASIX) 20 MG tablet Take by mouth.    [name] glucagon (GLUCAGON EMERGENCY) 1 MG injection Inject 1 mg into the vein once as needed.    [name] HYDROcodone-acetaminophen (NORCO/VICODIN) 5-325 MG tablet Take 1 tablet by mouth every 4 (four) hours as needed for moderate pain. 20 tablet 0  . insulin aspart (NOVOLOG) 100 UNIT/ML injection Inject 20 Units into the skin 3 (three) times daily.     . insulin glargine (LANTUS) 100 UNIT/ML injection Inject 15 Units into the skin at bedtime.     [name] latanoprost (XALATAN) 0.005 % ophthalmic solution Place 1 drop into both eyes at bedtime.    . meloxicam (MOBIC) 15 MG tablet Take by mouth.    . metoprolol  tartrate (LOPRESSOR) 25 MG tablet Take 25 mg by mouth 2 (two) times daily.    . Multiple Vitamin (MULTIVITAMIN) capsule Take 1 capsule by mouth daily.    . multivitamin (METANX) 3-35-2 MG TABS tablet Take 1 tablet by mouth 2 (two) times daily. 60 tablet 3  . omeprazole (PRILOSEC) 20 MG capsule Take 20 mg by mouth daily.    . Plant Sterols and Stanols (CHOLESTOFF PO) Take 2 capsules by mouth 2 (two) times daily.    . ranolazine (RANEXA) 1000 MG SR tablet TAKE 1 TABLET TWICE A DAY    . vitamin C (ASCORBIC ACID) 500 MG tablet Take 500 mg by mouth daily.     No current facility-administered medications on file prior to visit.   Allergies  Allergen Reactions  . Isosorbide Nausea And Vomiting and Other (See Comments)  . Statins Other (See Comments)    BODY PAIN  . Lipitor [Atorvastatin] Other (See Comments) and Rash    BODY PAIN   Objective:  General: Well developed, nourished, in no acute distress, alert and oriented x3   Dermatology: Skin is warm, dry and supple bilateral. Right hallux medial nail bed appears to be clean, dry, with mild granular tissue and surrounding eschar/scab and blistering skin at tip of right hallux that is pre-ulcerative that is improving in nature. Decreased Erythema. Decreased Edema. No serosanguous drainage present. The remaining nails appear unremarkable at this time. There are no other lesions or other signs of infection present.  Neurovascular status: unchanged. No lower extremity swelling; No pain with calf compression bilateral.  Musculoskeletal: Decreased tenderness to palpation of the Right hallux medial nail fold. Muscular strength within normal limits bilateral.   Assesement and Plan: Problem List Items Addressed This Visit    None    Visit Diagnoses    Ingrown toenail    -  Primary    S/p PNA, 10-23-15, Right hallux medial nail border, healing well    Cellulitis and abscess of toe, right        Improving     Type II diabetes mellitus with  neurological manifestations ([place])          -Examined patient  -Cleansed right hallux medial nail fold and gently scrubbed with peroxide and q-tip away eschar at site and applied triple antibiotic covered with bandaid.  -Discussed plan of care with patient. -Patient to cont with soaking in a weak solution of Epsom salt and warm water. Patient was instructed to soak for 15-20 minutes each day until the toe appears normal and there is no drainage, redness, tenderness, or swelling at the procedure site, and apply neosporin and a gauze or bandaid dressing each day as needed. May leave open to air at night. -Patient to complete course of Keflex as rx.  -Educated patient on long term care after nail surgery. -Patient was instrcuted to monitor the toe for reoccurrence and signs of infection; Patient advised to return to office if toe becomes red, hot or swollen. -Patient is to return 11/23/15 as scheduled for recheck of site and diabetic foot care as scheduled or sooner if problems arise.  Landis Martins, DPM

## 2015-11-01 DIAGNOSIS — M1712 Unilateral primary osteoarthritis, left knee: Secondary | ICD-10-CM | POA: Diagnosis not present

## 2015-11-01 DIAGNOSIS — Z6841 Body Mass Index (BMI) 40.0 and over, adult: Secondary | ICD-10-CM | POA: Diagnosis not present

## 2015-11-05 DIAGNOSIS — M179 Osteoarthritis of knee, unspecified: Secondary | ICD-10-CM | POA: Insufficient documentation

## 2015-11-05 DIAGNOSIS — M1710 Unilateral primary osteoarthritis, unspecified knee: Secondary | ICD-10-CM | POA: Insufficient documentation

## 2015-11-06 ENCOUNTER — Encounter: Payer: Self-pay | Admitting: Family Medicine

## 2015-11-06 ENCOUNTER — Ambulatory Visit (INDEPENDENT_AMBULATORY_CARE_PROVIDER_SITE_OTHER): Payer: Medicare Other | Admitting: Family Medicine

## 2015-11-06 VITALS — BP 140/74 | HR 74 | Temp 97.8°F | Wt 303.5 lb

## 2015-11-06 DIAGNOSIS — J019 Acute sinusitis, unspecified: Secondary | ICD-10-CM | POA: Insufficient documentation

## 2015-11-06 DIAGNOSIS — J0100 Acute maxillary sinusitis, unspecified: Secondary | ICD-10-CM | POA: Diagnosis not present

## 2015-11-06 MED ORDER — AMOXICILLIN-POT CLAVULANATE 875-125 MG PO TABS: 1.0000 | ORAL_TABLET | Freq: Two times a day (BID) | ORAL | Status: AC

## 2015-11-06 NOTE — Progress Notes (Signed)
Pre visit review using our clinic review tool, if applicable. No additional management support is needed unless otherwise documented below in the visit note. 

## 2015-11-06 NOTE — Assessment & Plan Note (Addendum)
Given duration and progression of sxs, treat with augmentin antibiotic.  Further supportive care as per instructions. Pt declines cough syrup - will continue robitussin OTC.

## 2015-11-06 NOTE — Progress Notes (Signed)
BP 140/74 mmHg  Pulse 74  Temp(Src) 97.8 F (36.6 C) (Oral)  Wt 303 lb 8 oz (137.667 kg)  SpO2 96%   CC: URI sxs  Subjective:    Patient ID: Lawrence [name], male    DOB: 1950/11/30, 65 y.o.   MRN: [id number]  HPI: Lawrence [name] is a 65 y.o. male presenting on 11/06/2015 for URI   Followed by [facility]. Patient of Isidoro Donning new to me with diabetes, CAD, OSA on CPAP and GERD.   1 mo h/o chest > head congestion, productive coughing, sneezing, rhinorrhea. Some chest tightness with cough. + ST, PNdrainage.  No fevers/chills, ear or tooth pain. No dyspnea or wheezing.  Has tried OTC remedies (alka seltzer plus cold, dayquil, mucinex max).  No smokers at home. + wife sick with similar illness.  Dx with mild COPD but pt denies this. Sleeps with CPAP for OSA.   H/o bronchitis, this feels like that.   Sugars have been running high since steroid injection into left knee.  Relevant past medical, surgical, family and social history reviewed and updated as indicated. Interim medical history since our last visit reviewed. Allergies and medications reviewed and updated. Current Outpatient Prescriptions on File Prior to Visit  Medication Sig  . cetirizine (ZYRTEC) 10 MG tablet Take 10 mg by mouth daily.  [name] docusate sodium (COLACE) 100 MG capsule Take 200 mg by mouth 2 (two) times daily.  . furosemide (LASIX) 20 MG tablet Take by mouth.  [name] glucagon (GLUCAGON EMERGENCY) 1 MG injection Inject 1 mg into the vein once as needed.  [name] HYDROcodone-acetaminophen (NORCO/VICODIN) 5-325 MG tablet Take 1 tablet by mouth every 4 (four) hours as needed for moderate pain.  [name] insulin aspart (NOVOLOG) 100 UNIT/ML injection Inject 20 Units into the skin 3 (three) times daily.   . insulin glargine (LANTUS) 100 UNIT/ML injection Inject 15 Units into the skin at bedtime.   [name] latanoprost (XALATAN) 0.005 % ophthalmic solution Place 1 drop into both eyes at bedtime.  . metoprolol tartrate (LOPRESSOR) 25 MG  tablet Take 25 mg by mouth 2 (two) times daily.  . Multiple Vitamin (MULTIVITAMIN) capsule Take 1 capsule by mouth daily.  . multivitamin (METANX) 3-35-2 MG TABS tablet Take 1 tablet by mouth 2 (two) times daily.  [name] omeprazole (PRILOSEC) 20 MG capsule Take 20 mg by mouth daily.  . Plant Sterols and Stanols (CHOLESTOFF PO) Take 2 capsules by mouth 2 (two) times daily.  . ranolazine (RANEXA) 1000 MG SR tablet TAKE 1 TABLET TWICE A DAY  . vitamin C (ASCORBIC ACID) 500 MG tablet Take 500 mg by mouth daily.   No current facility-administered medications on file prior to visit.    Review of Systems Per HPI unless specifically indicated in ROS section     Objective:    BP 140/74 mmHg  Pulse 74  Temp(Src) 97.8 F (36.6 C) (Oral)  Wt 303 lb 8 oz (137.667 kg)  SpO2 96%  Wt Readings from Last 3 Encounters:  11/06/15 303 lb 8 oz (137.667 kg)  09/01/15 318 lb (144.244 kg)  08/28/15 318 lb 12.8 oz (144.607 kg)    Physical Exam  Constitutional: He appears well-developed and well-nourished. No distress.  HENT:  Head: Normocephalic and atraumatic.  Right Ear: Hearing, tympanic membrane, external ear and ear canal normal.  Left Ear: Hearing, tympanic membrane, external ear and ear canal normal.  Nose: Mucosal edema present. No rhinorrhea. Right sinus exhibits maxillary sinus tenderness. Right sinus exhibits  no frontal sinus tenderness. Left sinus exhibits maxillary sinus tenderness. Left sinus exhibits no frontal sinus tenderness.  Mouth/Throat: Uvula is midline, oropharynx is clear and moist and mucous membranes are normal. No oropharyngeal exudate, posterior oropharyngeal edema, posterior oropharyngeal erythema or tonsillar abscesses.  Eyes: Conjunctivae and EOM are normal. Pupils are equal, round, and reactive to light. No scleral icterus.  Neck: Normal range of motion. Neck supple.  Cardiovascular: Normal rate, regular rhythm, normal heart sounds and intact distal pulses.   No murmur  heard. Pulmonary/Chest: Effort normal and breath sounds normal. No respiratory distress. He has no wheezes. He has no rales.  Slightly coarse  Lymphadenopathy:    He has no cervical adenopathy.  Skin: Skin is warm and dry. No rash noted.  Nursing note and vitals reviewed.     Assessment & Plan:   Problem List Items Addressed This Visit    Acute sinusitis - Primary    Given duration and progression of sxs, treat with augmentin antibiotic.  Further supportive care as per instructions. Pt declines cough syrup - will continue robitussin OTC.       Relevant Medications   amoxicillin-clavulanate (AUGMENTIN) 875-125 MG tablet       Follow up plan: Return if symptoms worsen or fail to improve.

## 2015-11-06 NOTE — Patient Instructions (Addendum)
You have a sinus infection. Take medicine as prescribed: augmentin 10 day course Robitussin or delsym for cough.  Push fluids and plenty of rest. Nasal saline irrigation or neti pot to help drain sinuses. May use plain mucinex with plenty of fluid to help mobilize mucous. Please let us know if fever >101.5, trouble opening/closing mouth, difficulty swallowing, or worsening instead of improving as expected.

## 2015-11-20 DIAGNOSIS — R600 Localized edema: Secondary | ICD-10-CM | POA: Diagnosis not present

## 2015-11-20 DIAGNOSIS — I10 Essential (primary) hypertension: Secondary | ICD-10-CM | POA: Diagnosis not present

## 2015-11-20 DIAGNOSIS — E782 Mixed hyperlipidemia: Secondary | ICD-10-CM | POA: Diagnosis not present

## 2015-11-20 DIAGNOSIS — I2510 Atherosclerotic heart disease of native coronary artery without angina pectoris: Secondary | ICD-10-CM | POA: Diagnosis not present

## 2015-11-23 ENCOUNTER — Ambulatory Visit (INDEPENDENT_AMBULATORY_CARE_PROVIDER_SITE_OTHER): Payer: Medicare Other | Admitting: Sports Medicine

## 2015-11-23 ENCOUNTER — Encounter: Payer: Self-pay | Admitting: Sports Medicine

## 2015-11-23 DIAGNOSIS — L600 Ingrowing nail: Secondary | ICD-10-CM

## 2015-11-23 DIAGNOSIS — B351 Tinea unguium: Secondary | ICD-10-CM

## 2015-11-23 DIAGNOSIS — M79672 Pain in left foot: Secondary | ICD-10-CM

## 2015-11-23 DIAGNOSIS — E1149 Type 2 diabetes mellitus with other diabetic neurological complication: Secondary | ICD-10-CM

## 2015-11-23 DIAGNOSIS — M79671 Pain in right foot: Secondary | ICD-10-CM | POA: Diagnosis not present

## 2015-11-23 NOTE — Progress Notes (Signed)
Patient ID: Lawrence [name], male   DOB: 29-Aug-1951, 65 y.o.   MRN: [id number]  Subjective: Lawrence [name] is a 65 y.o.  Diabetic male patient returns to office today for nail care and for follow up evaluation after having Right Hallux medial permanent nail avulsion performed on 10-23-15. Patient has been soaking using epsom salt and applying topical antibiotic covered with bandaid daily. Patient also completed Keflex with no adverse reaction. Denies fever/chills/nausea/vomitting/any other related constitutional symptoms at this time.  FBS this morning 50mg /dl that improved after rechecking and drinking orange juice.  Patient Active Problem List   Diagnosis Date Noted  . Acute sinusitis 11/06/2015  . Hyperlipidemia 08/28/2015  . Type 2 diabetes mellitus ([place]) 08/28/2015  . CAD (coronary artery disease) 08/28/2015  . Lower extremity edema 08/28/2015  . GERD (gastroesophageal reflux disease) 08/28/2015  . OSA (obstructive sleep apnea) 08/28/2015  . Morbid obesity with BMI of 40.0-44.9, adult ([place]) 08/28/2015   Current Outpatient Prescriptions on File Prior to Visit  Medication Sig Dispense Refill  . cetirizine (ZYRTEC) 10 MG tablet Take 10 mg by mouth daily.    [name] docusate sodium (COLACE) 100 MG capsule Take 200 mg by mouth 2 (two) times daily.    . furosemide (LASIX) 20 MG tablet Take by mouth.    [name] glucagon (GLUCAGON EMERGENCY) 1 MG injection Inject 1 mg into the vein once as needed.    [name] HYDROcodone-acetaminophen (NORCO/VICODIN) 5-325 MG tablet Take 1 tablet by mouth every 4 (four) hours as needed for moderate pain. 20 tablet 0  . insulin aspart (NOVOLOG) 100 UNIT/ML injection Inject 20 Units into the skin 3 (three) times daily.     . insulin glargine (LANTUS) 100 UNIT/ML injection Inject 15 Units into the skin at bedtime.     [name] latanoprost (XALATAN) 0.005 % ophthalmic solution Place 1 drop into both eyes at bedtime.    . metoprolol tartrate (LOPRESSOR) 25 MG tablet Take 25 mg by mouth 2  (two) times daily.    . Multiple Vitamin (MULTIVITAMIN) capsule Take 1 capsule by mouth daily.    . multivitamin (METANX) 3-35-2 MG TABS tablet Take 1 tablet by mouth 2 (two) times daily. 60 tablet 3  . omeprazole (PRILOSEC) 20 MG capsule Take 20 mg by mouth daily.    . Plant Sterols and Stanols (CHOLESTOFF PO) Take 2 capsules by mouth 2 (two) times daily.    . ranolazine (RANEXA) 1000 MG SR tablet TAKE 1 TABLET TWICE A DAY    . vitamin C (ASCORBIC ACID) 500 MG tablet Take 500 mg by mouth daily.     No current facility-administered medications on file prior to visit.   Allergies  Allergen Reactions  . Isosorbide Nausea And Vomiting, Other (See Comments) and Nausea Only  . Statins Other (See Comments)    BODY PAIN BODY PAIN  . Lipitor [Atorvastatin] Other (See Comments) and Rash    BODY PAIN   Objective:  General: Well developed, nourished, in no acute distress, alert and oriented x3   Dermatology: Skin is warm, dry and supple bilateral. Right hallux medial nail bed appears to be clean, dry, with surrounding eschar/scab right hallux medial margin. No Erythema. Decreased Edema. No serosanguous drainage present. The remaining nails elongated and dystrophic with subungal debris; there is dry blood underneath right 2nd toenail. There are no other lesions or other signs of infection present.  Neurovascular status: unchanged. Protective intact with SWMF however vibratory diminished bilateral. No lower extremity swelling; No pain with calf  compression bilateral.  Musculoskeletal: Decreased tenderness to palpation of the Right hallux medial nail fold. Muscular strength within normal limits bilateral. No other deformity.  Assesement and Plan: Problem List Items Addressed This Visit    None    Visit Diagnoses    Ingrown toenail    -  Primary    S/P PNA, Right hallux medial margin 10-23-15, healing well    Dermatophytosis of nail        Type II diabetes mellitus with neurological  manifestations ([place])        Foot pain, bilateral           -Examined patient  Discussed and educated patient on diabetic foot care, especially with regards to the vascular, neurological and musculoskeletal systems. Stressed the importance of good glycemic control and the detriment of not  controlling glucose levels in relation to the foot. -Recommended to d/c Epsom salt soaks -Patient was instrcuted to monitor the right hallux for reoccurrence and signs of infection; Patient advised to return to office if toe becomes red, hot or swollen. -Mechanically debrided nails x 10 using sterile nail nipper without complication  -Recommend good supportive shoes and cont with compression stockings for edema control -Patient is to return in 3 months for diabetic foot care or sooner if problems arise.  Landis Martins, DPM

## 2015-12-21 ENCOUNTER — Encounter: Payer: Self-pay | Admitting: Sports Medicine

## 2016-01-10 ENCOUNTER — Other Ambulatory Visit: Payer: Self-pay | Admitting: Podiatry

## 2016-01-30 ENCOUNTER — Encounter
Admission: RE | Admit: 2016-01-30 | Discharge: 2016-01-30 | Disposition: A | Discharge status: Home / Self Care | Payer: Medicare Other | Source: Ambulatory Visit | Attending: Orthopedic Surgery | Admitting: Orthopedic Surgery

## 2016-01-30 DIAGNOSIS — M1712 Unilateral primary osteoarthritis, left knee: Secondary | ICD-10-CM | POA: Diagnosis not present

## 2016-01-30 DIAGNOSIS — Z01812 Encounter for preprocedural laboratory examination: Secondary | ICD-10-CM | POA: Insufficient documentation

## 2016-01-30 DIAGNOSIS — J449 Chronic obstructive pulmonary disease, unspecified: Secondary | ICD-10-CM | POA: Insufficient documentation

## 2016-01-30 DIAGNOSIS — M7989 Other specified soft tissue disorders: Secondary | ICD-10-CM | POA: Insufficient documentation

## 2016-01-30 DIAGNOSIS — Z6841 Body Mass Index (BMI) 40.0 and over, adult: Secondary | ICD-10-CM | POA: Insufficient documentation

## 2016-01-30 DIAGNOSIS — I2510 Atherosclerotic heart disease of native coronary artery without angina pectoris: Secondary | ICD-10-CM | POA: Diagnosis not present

## 2016-01-30 DIAGNOSIS — E785 Hyperlipidemia, unspecified: Secondary | ICD-10-CM | POA: Diagnosis not present

## 2016-01-30 DIAGNOSIS — M791 Myalgia: Secondary | ICD-10-CM | POA: Diagnosis not present

## 2016-01-30 DIAGNOSIS — G4733 Obstructive sleep apnea (adult) (pediatric): Secondary | ICD-10-CM | POA: Insufficient documentation

## 2016-01-30 DIAGNOSIS — E1140 Type 2 diabetes mellitus with diabetic neuropathy, unspecified: Secondary | ICD-10-CM | POA: Insufficient documentation

## 2016-01-30 DIAGNOSIS — I252 Old myocardial infarction: Secondary | ICD-10-CM | POA: Insufficient documentation

## 2016-01-30 DIAGNOSIS — I10 Essential (primary) hypertension: Secondary | ICD-10-CM | POA: Diagnosis not present

## 2016-01-30 HISTORY — DX: Polyneuropathy, unspecified: G62.9

## 2016-01-30 HISTORY — DX: Hyperlipidemia, unspecified: E78.5

## 2016-01-30 HISTORY — DX: Sleep apnea, unspecified: G47.30

## 2016-01-30 LAB — URINALYSIS COMPLETE WITH MICROSCOPIC (ARMC ONLY)
BILIRUBIN URINE: NEGATIVE
Bacteria, UA: NONE SEEN
GLUCOSE, UA: NEGATIVE mg/dL
Hgb urine dipstick: NEGATIVE
KETONES UR: NEGATIVE mg/dL
LEUKOCYTES UA: NEGATIVE
Nitrite: NEGATIVE
PROTEIN: NEGATIVE mg/dL
SPECIFIC GRAVITY, URINE: 1.006 (ref 1.005–1.030)
SQUAMOUS EPITHELIAL / LPF: NONE SEEN
WBC UA: NONE SEEN WBC/hpf (ref 0–5)
pH: 8 (ref 5.0–8.0)

## 2016-01-30 LAB — BASIC METABOLIC PANEL
ANION GAP: 6 (ref 5–15)
BUN: 20 mg/dL (ref 6–20)
CALCIUM: 9.3 mg/dL (ref 8.9–10.3)
CO2: 28 mmol/L (ref 22–32)
Chloride: 103 mmol/L (ref 101–111)
Creatinine, Ser: 1.01 mg/dL (ref 0.61–1.24)
GLUCOSE: 45 mg/dL — AB (ref 65–99)
POTASSIUM: 3.7 mmol/L (ref 3.5–5.1)
SODIUM: 137 mmol/L (ref 135–145)

## 2016-01-30 LAB — CBC
HCT: 39 % — ABNORMAL LOW (ref 40.0–52.0)
Hemoglobin: 13.4 g/dL (ref 13.0–18.0)
MCH: 32.4 pg (ref 26.0–34.0)
MCHC: 34.4 g/dL (ref 32.0–36.0)
MCV: 94.3 fL (ref 80.0–100.0)
PLATELETS: 242 10*3/uL (ref 150–440)
RBC: 4.13 MIL/uL — AB (ref 4.40–5.90)
RDW: 13 % (ref 11.5–14.5)
WBC: 8.8 10*3/uL (ref 3.8–10.6)

## 2016-01-30 LAB — SURGICAL PCR SCREEN
MRSA, PCR: NEGATIVE
STAPHYLOCOCCUS AUREUS: POSITIVE — AB

## 2016-01-30 LAB — SEDIMENTATION RATE: Sed Rate: 37 mm/hr — ABNORMAL HIGH (ref 0–20)

## 2016-01-30 LAB — APTT: APTT: 29 s (ref 24–36)

## 2016-01-30 LAB — TYPE AND SCREEN
ABO/RH(D): O POS
Antibody Screen: NEGATIVE

## 2016-01-30 LAB — PROTIME-INR
INR: 0.98
PROTHROMBIN TIME: 13.2 s (ref 11.4–15.0)

## 2016-01-30 LAB — ABO/RH: ABO/RH(D): O POS

## 2016-01-30 LAB — HEMOGLOBIN A1C: HEMOGLOBIN A1C: 7.3 % — AB (ref 4.0–6.0)

## 2016-01-30 NOTE — Pre-Procedure Instructions (Signed)
Negative MRSA Positive Staph result and ESR result faxed to Dr Clydell Hakim office.

## 2016-01-30 NOTE — Pre-Procedure Instructions (Signed)
Lawrence Dibble, MD - 11/20/2015 11:45 AM EST Formatting of this note may be different from the original. Established Patient Visit   Chief Complaint: Chief Complaint  Patient presents with  . Follow-up  78mon  . Shortness of Breath  occas with exertion  . Leg Swelling  BIL Occas  Date of Service: 11/20/2015 Date of Birth: 19-Apr-1951 PCP: Lawrence F REID, MD  History of Present Illness: Lawrence [name] is a 66 y.o.male patient  Mixed Hyperlipidemia The patient has known mixed hyperlipidemia with cardiovascular risk factors including age, male, HTN, Hyperlipidemia, history of heart disease, obesity and sedentary life style. It has been recommended to the patient use statin therapy for further risk reduction of cardiovascular event. The patient has had side effects and concerns of statin therapy including Mylagia, Weakness and Arthralgia. Therefore the patient wishes not to use statin therapy at this time. We have discussed all risks and benefits of statin therapy as well as adjustment of dosages and concomitant use with other medications. Coronary Artery Atherosclerosis The patient has had a known diagnosis of coronary artery atherosclerosis by cath years ago. The patient has received appropriate medication management for risk factors including age, male, obesity and sedentary life style. They are currently taking beta-blocker for further risk reductions and prevention and tolerating well. There has been a discussion of risks and benefits of these treatment options. Hypertensive heart disease The patient has had hypertension with other cardiovascular concerns and/or complications. This includes coronary artery disease, peripheral vascular disease and valve disease. Blood pressure readings have been stable at this time. There has not been recent side effects to these medications. We have discussed current guidelines for treatment and risk factor modification for which the patient understands and agrees  at this time. Edema The patient has chronic Trace lower extremity edema multifactorial in nature including pulmonary hypertension and venous insufficiency over the last 4 months currently on Furosemide (Lasix) improving. The patient has been diligent in other treatment options including compression hose, a low-sodium diet, and leg elevation  Past Medical and Surgical History  Past Medical History Past Medical History  Diagnosis Date  . CAD (coronary artery disease)  MI with significant 2 vessel disease, distal vessel artery 2008. 50% proximal, 60 % mid, 85% distal RCA, 30% proximal LAD, 99% D1, 50% mid circ and difussed disease of distal obtuse marginal  . Diabetes mellitus type 2, uncomplicated ([place])  . Hyperlipidemia  . Hypertension  . Myocardial infarction ([place])  . Sleep apnea   Past Surgical History He has no past surgical history on file.   Medications and Allergies  Current Medications  Current Outpatient Prescriptions  Medication Sig Dispense Refill  . ascorbic acid, vitamin C, (VITAMIN C) 500 MG tablet Take 500 mg by mouth once daily.   . cetirizine (ZYRTEC) 10 MG tablet Take 10 mg by mouth once daily.   . cholecalciferol (VITAMIN D3) 1,000 unit tablet Take 1,000 Units by mouth once daily.   . cinnamon bark 500 mg capsule Take 500 mg by mouth 2 (two) times daily as needed.  . docusate (COLACE) 100 MG capsule Take 200 mg by mouth 2 (two) times daily as needed.   [name] HYDROcodone-acetaminophen (NORCO) 5-325 mg tablet Take 1 tablet by mouth every 8 (eight) hours as needed for Pain. 40 tablet 0  . insulin GLARGINE (LANTUS) 100 unit/mL injection Inject 20 Units subcutaneously nightly.  . latanoprost (XALATAN) 0.005 % ophthalmic solution Place 1 drop into both eyes nightly.   . metoprolol tartrate (  LOPRESSOR) 25 MG tablet Take by mouth 2 (two) times daily.   . multivitamin tablet Take 1 tablet by mouth once daily.  [name] NOVOLOG 100 unit/mL injection Inject subcutaneously 3 (three)  times daily with meals. Sliding scale  . RANEXA 1,000 mg ER tablet TAKE 1 TABLET TWICE A DAY 180 tablet 3  . FUROsemide (LASIX) 20 MG tablet Take 1 tablet (20 mg total) by mouth 2 (two) times daily. 180 tablet 4  . glucagon (GLUCAGON) 1 mg injection Inject 1 mg into the vein once as needed. Reported on 11/20/2015  . omeprazole (PRILOSEC) 20 MG DR capsule Take by mouth. Reported on 11/20/2015   No current facility-administered medications for this visit.   Allergies: Imdur [isosorbide mononitrate]; Lipitor [atorvastatin]; and Statins-hmg-coa reductase inhibitors  Social and Family History  Social History reports that he quit smoking about 29 years ago. His smoking use included Cigarettes. He has a 34.00 pack-year smoking history. He has never used smokeless tobacco. He reports that he does not drink alcohol or use illicit drugs.  Family History Family History  Problem Relation Age of Onset  . Cancer Mother  . Other Brother  overdose  . Heart disease Father   Review of Systems   Review of Systems  Positive for none Negative for weight gain weight loss, weakness, vision change, hearing loss, cough, congestion, PND, orthopnea, heartburn, nausea, diaphoresis, vomiting, diarrhea, bloody stool, melena, stomach pain, leg weakness, leg cramping, leg blood clots, headache, blackouts, nosebleed, trouble swallowing, mouth pain, urinary frequency, urination at night, muscle weakness, skin lesions, skin rashes, tingling ,ulcers, numbness, anxiety, and/or depression Physical Examination   Vitals: Visit Vitals  . BP (P) 120/70 (BP Location: Left upper arm, Patient Position: Sitting, BP Cuff Size: Large Adult)  . Pulse (P) 74  . Resp (P) 16  . Ht (P) 180.3 cm (5\' 11" )  . Wt (!) (P) 138.5 kg (305 lb 6.4 oz)  . SpO2 (P) 94%  . BMI (P) 42.59 kg/m2   Ht:(P) 180.3 cm (5\' 11" ) Wt:(!) (P) 138.5 kg (305 lb 6.4 oz) [id number] surface area is 2.63 meters squared (pended). Body mass index is 42.59  kg/(m^2) (pended). Appearance: well appearing in no acute distress HEENT: Pupils equally reactive to light and accomodation, no xanthalasma  Neck: Supple, no apparent thyromegaly, masses, or lymphadenopathy  Lungs: normal respiratory effort; no crackles, no rhonchi, no wheezes Heart: Regular rate and rhythm. Normal S1 S2 No gallops, murmur, no rub, PMI is normal size and placement. carotid upstroke normal without bruit. Jugular venous pressure is normal Abdomen: soft, nontender, distended with normal bowel sounds. No apparent hepatosplenomegally. Abdominal aorta is normal size without bruit Extremities: trace edema, no ulcers, no clubbing, no cyanosis Peripheral Pulses: 2+ in upper extremities, 0+ femoral pulses bilaterally, 1+lower extremity  Musculoskeletal; Normal muscle tone without kyphosis Neurological: Cranial nerves intact, Oriented and Alert  Assessment   65 y.o. male with  Encounter Diagnoses  Name Primary?  . Coronary artery disease involving native coronary artery of native heart without angina pectoris Yes  . Bilateral leg edema  . Benign essential hypertension  . Mixed hyperlipidemia   Plan  -Abstain from statin therapy at this time due to concerns of significant side effects of the medication and or wishes of the patient. We have discussed all risks and benefits of statin therapy in the setting of cardiovascular disease risk. -The patient understands all risks of future cardiovascular disease process based on discussion today. We will continue all risk factor modification and  prevention including lipid management, exercise and diet, blood pressure control, and anti-platelet medication management as tolerated. -Hypertension medication management listed above has been reviewed and discussed with the patient. We will continue current medical regimen at this time for reduction of risk of cardiovascular disease. The patient will report any new or future change of blood pressure for  need in treatment changes. -Furosemide for lower extremity edema with continued discussion of other home treatment options including the DASH diet and or compression hose  No orders of the defined types were placed in this encounter.  Return in about 6 months (around 05/19/2016).  Lawrence Dibble, MD    Plan of Treatment - as of this encounter Not on file

## 2016-01-30 NOTE — Pre-Procedure Instructions (Signed)
Value Range  Vent Rate (bpm) 74   PR Interval (msec) 160   QRS Interval (msec) 84   QT Interval (msec) 374   QTc (msec) 415    Result Narrative  Normal sinus rhythm Low voltage QRS Cannot rule out Anterior infarct , age undetermined Abnormal ECG No previous ECGs available I reviewed and concur with this report. Electronically signed [id number] MD, Darnell Level [phone]) on 05/16/2015 6:02:43 PM   Status Results Details   Encounter Summary

## 2016-01-30 NOTE — Pre-Procedure Instructions (Signed)
48   Aortic Valve Stenosis Grade none   Aortic Valve Regurgitation Grade none   Aortic Valve Max Velocity (m/s) 1.3 m/sec   Aortic Valve Stenosis Mean Gradient (mmHg) 4.0 mmHg   Mitral Valve Regurgitation Grade mild   Mitral Valve Stenosis Grade none   Tricuspid Valve Regurgitation Grade mild   Tricuspid Valve Regurgitation Max Velocity (m/s) 2.7 m/sec   Right Ventricle Systolic Pressure (mmHg) XX123456 mmHg   LV End Diastolic Diameter (cm) 4.3 cm  LV End Systolic Diameter (cm) 3.2 cm  LV Septum Wall Thickness (cm) 1.3 cm  LV Posterior Wall Thickness (cm) 0.95 cm  Left Atrium Diameter (cm) 4.3 cm   Result Narrative                      CARDIOLOGY DEPARTMENT                        [name], Lawrence                           [id number]                   [facility]                       Acct #: [id number]         [address], [place], [place] [address]             Date: 05/22/2015 11:06 AM                                                                  Adult Male Age: 65 yrs                     ECHOCARDIOGRAM REPORT                        Outpatient          STUDY:CHEST WALL         TAPE:0000:00: 0:00:00          KC::KCWC           ECHO:Yes   DOPPLER:Yes  FILE:[id number]              MD1:          COLOR:Yes  CONTRAST:No       MACHINE:Philips            Height: 70 in      RV BIOPSY:No         3D:No    SOUND QLTY:Moderate           Weight: 311 lb         MEDIUM:None  BSA: 2.5 m2 ___________________________________________________________________________________________                  HISTORY:DOE                   REASON:Assess, LV function               INDICATION:R06.02 Shortness of breath  ___________________________________________________________________________________________ ECHOCARDIOGRAPHIC MEASUREMENTS 2D DIMENSIONS AORTA             Values      Normal  Range      MAIN PA          Values      Normal Range           Annulus:  nm*       [2.3 - 2.9]                PA Main:  nm*       [1.5 - 2.1]         Aorta Sin:  nm*       [3.1 - 3.7]       RIGHT VENTRICLE       ST Junction:  nm*       [2.6 - 3.2]                RV Base:  nm*       [ < 4.2]         Asc.Aorta:  nm*       [2.6 - 3.4]                 RV Mid:  nm*       [ < 3.5]  LEFT VENTRICLE                                        RV Length:  nm*       [ < 8.6]             LVIDd:  4.3 cm    [4.2 - 5.9]       INFERIOR VENA CAVA             LVIDs:  3.2 cm                              Max. IVC:  nm*       [ <= 2.1]                FS:  25.1 %    [> 25]                    Min. IVC:  nm*               SWT:  1.3 cm    [0.6 - 1.0]                   ------------------               PWT:  0.95 cm   [0.6 - 1.0]                   nm* - not measured  LEFT ATRIUM           LA Diam:  4.3 cm    [3.0 - 4.0]       LA A4C Area:  nm*       [ < 20]         LA Volume:  nm*       [18 - 58]  ___________________________________________________________________________________________ ECHOCARDIOGRAPHIC DESCRIPTIONS  AORTIC ROOT         Size:Normal   Dissection:No dissection  AORTIC VALVE     Leaflets:Tricuspid        Morphology:MILDLY THICKENED     Mobility:Fully mobile  LEFT VENTRICLE         Size:Normal             Anterior:Normal  Contraction:Normal              Lateral:Normal   Closest EF:50% (Estimated)      Septal:Normal    LV Masses:No Masses            Apical:Normal          [id number]               Inferior:Normal                                Posterior:Normal Dias.FxClass:(Grade 1) relaxation abnormal, E/A reversal  MITRAL VALVE     Leaflets:Normal             Mobility:Fully mobile   Morphology:Normal  LEFT ATRIUM         Size:Normal            LA Masses:No masses    IA Septum:Normal IAS  MAIN PA         Size:Normal  PULMONIC VALVE   Morphology:Normal             Mobility:Fully  mobile  RIGHT VENTRICLE    RV Masses:No Masses              Size:Normal    Free Wall:Normal          Contraction:Normal  TRICUSPID VALVE     Leaflets:Normal             Mobility:Fully mobile   Morphology:Normal  RIGHT ATRIUM         Size:Normal             RA Other:None      RA Mass:No masses  PERICARDIUM        Fluid:No effusion  INFERIOR VENACAVA         Size:Normal Normal respiratory collapse   ____________________________________________________________________ DOPPLER ECHO and OTHER SPECIAL PROCEDURES    Aortic:No AR                         No AS           129.0 cm/sec peak vel         6.7 mmHg peak grad           4.0 mmHg mean grad            3.6 cm^2 by DOPPLER     Mitral:MILD MR                       No MS                                         5.0 cm^2 by DOPPLER           MV  Inflow E Vel=63.1 cm/sec   MV Annulus E'Vel=6.8 cm/sec           E/E'Ratio=9.3  Tricuspid:MILD TR                       No TS           268.0 cm/sec peak TR vel      33.7 mmHg peak RV pressure  Pulmonary:TRIVIAL PR                    No PS     ___________________________________________________________________________________________ INTERPRETATION NORMAL LEFT VENTRICULAR SYSTOLIC FUNCTION WITH AN ESTIMATED EF = 50-55 % NORMAL RIGHT VENTRICULAR SYSTOLIC FUNCTION MILD TRICUSPID AND MITRAL VALVE INSUFFICIENCY NO VALVULAR STENOSIS   ___________________________________________________________________________________________ Electronically signed by: MD Lawrence [name] on 05/22/2015 11:56 AM             Performed By: Lawrence [name], RDCS, RVT       Ordering Physician: Lawrence [name]  ___________________________________________________________________________________________   Status Results Details   Encounter Summary

## 2016-01-30 NOTE — Patient Instructions (Signed)
  Your procedure is scheduled on: 02/11/16 Mon Report to Day Surgery.2nd floor medical mall To find out your arrival time please call [phone] between 1PM - 3PM on 02/08/16 Fri  Remember: Instructions that are not followed completely may result in serious medical risk, up to and including death, or upon the discretion of your surgeon and anesthesiologist your surgery may need to be rescheduled.    _x___ 1. Do not eat food or drink liquids after midnight. No gum chewing or hard candies.     ____ 2. No Alcohol for 24 hours before or after surgery.   ____ 3. Bring all medications with you on the day of surgery if instructed.    _x___ 4. Notify your doctor if there is any change in your medical condition     (cold, fever, infections).     Do not wear jewelry, make-up, hairpins, clips or nail polish.  Do not wear lotions, powders, or perfumes. You may wear deodorant.  Do not shave 48 hours prior to surgery. Men may shave face and neck.  Do not bring valuables to the hospital.    [facility] is not responsible for any belongings or valuables.               Contacts, dentures or bridgework may not be worn into surgery.  Leave your suitcase in the car. After surgery it may be brought to your room.  For patients admitted to the hospital, discharge time is determined by your                treatment team.   Patients discharged the day of surgery will not be allowed to drive home.   Please read over the following fact sheets that you were given:   MRSA Information   ____ Take these medicines the morning of surgery with A SIP OF WATER:    1. metoprolol tartrate (LOPRESSOR) 25 MG tablet  2. omeprazole (PRILOSEC) 20 MG capsule  3.   4.  5.  6.  ____ Fleet Enema (as directed)   _x___ Use CHG Soap as directed  ____ Use inhalers on the day of surgery  ____ Stop metformin 2 days prior to surgery    _x___ Take 1/2 of usual insulin dose the night before surgery and none on the morning  of surgery. insulin glargine (LANTUS) 100 UNIT/ML injection  ____ Stop Coumadin/Plavix/aspirin on   ____ Stop Anti-inflammatories on    ____ Stop supplements until after surgery.    ____ Bring C-Pap to the hospital.

## 2016-02-01 LAB — URINE CULTURE: Culture: NO GROWTH

## 2016-02-11 ENCOUNTER — Inpatient Hospital Stay: Payer: Medicare Other | Admitting: Certified Registered Nurse Anesthetist

## 2016-02-11 ENCOUNTER — Encounter
Admission: RE | Disposition: A | Discharge status: Home Health | Payer: Self-pay | Source: Ambulatory Visit | Attending: Orthopedic Surgery

## 2016-02-11 ENCOUNTER — Inpatient Hospital Stay
Admission: RE | Admit: 2016-02-11 | Discharge: 2016-02-13 | DRG: 470 | Disposition: A | Discharge status: Home Health | Payer: Medicare Other | Source: Ambulatory Visit | Attending: Orthopedic Surgery | Admitting: Orthopedic Surgery

## 2016-02-11 ENCOUNTER — Inpatient Hospital Stay: Payer: Medicare Other

## 2016-02-11 ENCOUNTER — Encounter: Payer: Self-pay | Admitting: Anesthesiology

## 2016-02-11 DIAGNOSIS — I252 Old myocardial infarction: Secondary | ICD-10-CM | POA: Diagnosis not present

## 2016-02-11 DIAGNOSIS — K219 Gastro-esophageal reflux disease without esophagitis: Secondary | ICD-10-CM | POA: Diagnosis present

## 2016-02-11 DIAGNOSIS — E1165 Type 2 diabetes mellitus with hyperglycemia: Secondary | ICD-10-CM | POA: Diagnosis present

## 2016-02-11 DIAGNOSIS — Z961 Presence of intraocular lens: Secondary | ICD-10-CM | POA: Diagnosis present

## 2016-02-11 DIAGNOSIS — Z6841 Body Mass Index (BMI) 40.0 and over, adult: Secondary | ICD-10-CM

## 2016-02-11 DIAGNOSIS — Z85828 Personal history of other malignant neoplasm of skin: Secondary | ICD-10-CM

## 2016-02-11 DIAGNOSIS — Z87891 Personal history of nicotine dependence: Secondary | ICD-10-CM

## 2016-02-11 DIAGNOSIS — Z9842 Cataract extraction status, left eye: Secondary | ICD-10-CM | POA: Diagnosis not present

## 2016-02-11 DIAGNOSIS — E785 Hyperlipidemia, unspecified: Secondary | ICD-10-CM | POA: Diagnosis present

## 2016-02-11 DIAGNOSIS — H409 Unspecified glaucoma: Secondary | ICD-10-CM | POA: Diagnosis present

## 2016-02-11 DIAGNOSIS — I2510 Atherosclerotic heart disease of native coronary artery without angina pectoris: Secondary | ICD-10-CM | POA: Diagnosis present

## 2016-02-11 DIAGNOSIS — I10 Essential (primary) hypertension: Secondary | ICD-10-CM | POA: Diagnosis present

## 2016-02-11 DIAGNOSIS — G4733 Obstructive sleep apnea (adult) (pediatric): Secondary | ICD-10-CM | POA: Diagnosis present

## 2016-02-11 DIAGNOSIS — Z9841 Cataract extraction status, right eye: Secondary | ICD-10-CM

## 2016-02-11 DIAGNOSIS — Z8261 Family history of arthritis: Secondary | ICD-10-CM | POA: Diagnosis not present

## 2016-02-11 DIAGNOSIS — I7090 Unspecified atherosclerosis: Secondary | ICD-10-CM | POA: Diagnosis not present

## 2016-02-11 DIAGNOSIS — E119 Type 2 diabetes mellitus without complications: Secondary | ICD-10-CM | POA: Diagnosis not present

## 2016-02-11 DIAGNOSIS — Z79899 Other long term (current) drug therapy: Secondary | ICD-10-CM | POA: Diagnosis not present

## 2016-02-11 DIAGNOSIS — Z96652 Presence of left artificial knee joint: Secondary | ICD-10-CM | POA: Diagnosis not present

## 2016-02-11 DIAGNOSIS — Z888 Allergy status to other drugs, medicaments and biological substances status: Secondary | ICD-10-CM

## 2016-02-11 DIAGNOSIS — E871 Hypo-osmolality and hyponatremia: Secondary | ICD-10-CM | POA: Diagnosis not present

## 2016-02-11 DIAGNOSIS — M1712 Unilateral primary osteoarthritis, left knee: Secondary | ICD-10-CM | POA: Diagnosis not present

## 2016-02-11 DIAGNOSIS — Z8249 Family history of ischemic heart disease and other diseases of the circulatory system: Secondary | ICD-10-CM | POA: Diagnosis not present

## 2016-02-11 DIAGNOSIS — Z794 Long term (current) use of insulin: Secondary | ICD-10-CM

## 2016-02-11 DIAGNOSIS — Z471 Aftercare following joint replacement surgery: Secondary | ICD-10-CM | POA: Diagnosis not present

## 2016-02-11 DIAGNOSIS — J449 Chronic obstructive pulmonary disease, unspecified: Secondary | ICD-10-CM | POA: Diagnosis not present

## 2016-02-11 DIAGNOSIS — Z96659 Presence of unspecified artificial knee joint: Secondary | ICD-10-CM

## 2016-02-11 DIAGNOSIS — E1142 Type 2 diabetes mellitus with diabetic polyneuropathy: Secondary | ICD-10-CM | POA: Diagnosis present

## 2016-02-11 DIAGNOSIS — E1140 Type 2 diabetes mellitus with diabetic neuropathy, unspecified: Secondary | ICD-10-CM | POA: Diagnosis not present

## 2016-02-11 DIAGNOSIS — Z833 Family history of diabetes mellitus: Secondary | ICD-10-CM

## 2016-02-11 DIAGNOSIS — M25562 Pain in left knee: Secondary | ICD-10-CM | POA: Diagnosis not present

## 2016-02-11 HISTORY — PX: KNEE ARTHROPLASTY: SHX992

## 2016-02-11 HISTORY — DX: Atherosclerotic heart disease of native coronary artery without angina pectoris: I25.10

## 2016-02-11 LAB — GLUCOSE, CAPILLARY
GLUCOSE-CAPILLARY: 303 mg/dL — AB (ref 65–99)
GLUCOSE-CAPILLARY: 266 mg/dL — AB (ref 65–99)
GLUCOSE-CAPILLARY: 369 mg/dL — AB (ref 65–99)
Glucose-Capillary: 163 mg/dL — ABNORMAL HIGH (ref 65–99)

## 2016-02-11 SURGERY — ARTHROPLASTY, KNEE, TOTAL, USING IMAGELESS COMPUTER-ASSISTED NAVIGATION
Anesthesia: Spinal | Site: Knee | Laterality: Left | Wound class: Clean

## 2016-02-11 MED ORDER — PROPOFOL 500 MG/50ML IV EMUL
INTRAVENOUS | Status: DC | PRN
  Administered 2016-02-11: 50 ug/kg/min via INTRAVENOUS

## 2016-02-11 MED ORDER — FERROUS SULFATE 325 (65 FE) MG PO TABS
325.0000 mg | ORAL_TABLET | Freq: Two times a day (BID) | ORAL | Status: DC
  Administered 2016-02-11 – 2016-02-13 (×4): 325 mg via ORAL
  Filled 2016-02-11 (×4): qty 1

## 2016-02-11 MED ORDER — BUPIVACAINE-EPINEPHRINE (PF) 0.25% -1:200000 IJ SOLN
INTRAMUSCULAR | Status: AC
  Filled 2016-02-11: qty 30

## 2016-02-11 MED ORDER — SODIUM CHLORIDE 0.9 % IV SOLN
INTRAVENOUS | Status: DC | PRN
  Administered 2016-02-11: 60 mL

## 2016-02-11 MED ORDER — TRAMADOL HCL 50 MG PO TABS
50.0000 mg | ORAL_TABLET | ORAL | Status: DC | PRN
  Administered 2016-02-11 – 2016-02-12 (×2): 100 mg via ORAL
  Filled 2016-02-11 (×2): qty 2

## 2016-02-11 MED ORDER — DIPHENHYDRAMINE HCL 12.5 MG/5ML PO ELIX
12.5000 mg | ORAL_SOLUTION | ORAL | Status: DC | PRN
  Administered 2016-02-12: 12.5 mg via ORAL
  Filled 2016-02-11: qty 10

## 2016-02-11 MED ORDER — SODIUM CHLORIDE 0.9 % IJ SOLN
INTRAMUSCULAR | Status: AC
  Filled 2016-02-11: qty 50

## 2016-02-11 MED ORDER — ONDANSETRON HCL 4 MG/2ML IJ SOLN
4.0000 mg | Freq: Once | INTRAMUSCULAR | Status: DC | PRN
Start: 2016-02-11 — End: 2016-02-11

## 2016-02-11 MED ORDER — MAGNESIUM HYDROXIDE 400 MG/5ML PO SUSP
30.0000 mL | Freq: Every day | ORAL | Status: DC | PRN
  Administered 2016-02-12 (×2): 30 mL via ORAL
  Filled 2016-02-11 (×2): qty 30

## 2016-02-11 MED ORDER — ACETAMINOPHEN 10 MG/ML IV SOLN
1000.0000 mg | Freq: Four times a day (QID) | INTRAVENOUS | Status: AC
  Administered 2016-02-11 – 2016-02-12 (×4): 1000 mg via INTRAVENOUS
  Filled 2016-02-11 (×4): qty 100

## 2016-02-11 MED ORDER — SODIUM CHLORIDE 0.9 % IV SOLN
INTRAVENOUS | Status: DC
  Administered 2016-02-11 (×3): via INTRAVENOUS

## 2016-02-11 MED ORDER — PANTOPRAZOLE SODIUM 40 MG PO TBEC
40.0000 mg | DELAYED_RELEASE_TABLET | Freq: Two times a day (BID) | ORAL | Status: DC
  Administered 2016-02-11 – 2016-02-13 (×4): 40 mg via ORAL
  Filled 2016-02-11 (×4): qty 1

## 2016-02-11 MED ORDER — CELECOXIB 200 MG PO CAPS
200.0000 mg | ORAL_CAPSULE | Freq: Two times a day (BID) | ORAL | Status: DC
  Administered 2016-02-11 – 2016-02-13 (×4): 200 mg via ORAL
  Filled 2016-02-11 (×4): qty 1

## 2016-02-11 MED ORDER — SENNOSIDES-DOCUSATE SODIUM 8.6-50 MG PO TABS
1.0000 | ORAL_TABLET | Freq: Two times a day (BID) | ORAL | Status: DC
  Administered 2016-02-11 – 2016-02-13 (×4): 1 via ORAL
  Filled 2016-02-11 (×4): qty 1

## 2016-02-11 MED ORDER — NEOMYCIN-POLYMYXIN B GU 40-200000 IR SOLN
Status: AC
Start: 2016-02-11 — End: 2016-02-11
  Filled 2016-02-11: qty 20

## 2016-02-11 MED ORDER — PHENOL 1.4 % MT LIQD: 1.0000 | OROMUCOSAL | Status: DC | PRN

## 2016-02-11 MED ORDER — INSULIN GLARGINE 100 UNIT/ML ~~LOC~~ SOLN
15.0000 [IU] | Freq: Every day | SUBCUTANEOUS | Status: DC
  Administered 2016-02-11: 15 [IU] via SUBCUTANEOUS
  Filled 2016-02-11 (×2): qty 0.15

## 2016-02-11 MED ORDER — CEFAZOLIN SODIUM-DEXTROSE 2-4 GM/100ML-% IV SOLN: 2.0000 g | Freq: Once | INTRAVENOUS | Status: DC

## 2016-02-11 MED ORDER — FENTANYL CITRATE (PF) 100 MCG/2ML IJ SOLN
INTRAMUSCULAR | Status: DC | PRN
  Administered 2016-02-11 (×2): 25 ug via INTRAVENOUS
  Administered 2016-02-11: 50 ug via INTRAVENOUS

## 2016-02-11 MED ORDER — ADULT MULTIVITAMIN W/MINERALS CH
1.0000 | ORAL_TABLET | Freq: Every day | ORAL | Status: DC
  Administered 2016-02-12 – 2016-02-13 (×2): 1 via ORAL
  Filled 2016-02-11 (×2): qty 1

## 2016-02-11 MED ORDER — BUPIVACAINE-EPINEPHRINE 0.25% -1:200000 IJ SOLN
INTRAMUSCULAR | Status: DC | PRN
  Administered 2016-02-11: 30 mL

## 2016-02-11 MED ORDER — CEFAZOLIN SODIUM-DEXTROSE 2-4 GM/100ML-% IV SOLN
INTRAVENOUS | Status: AC
  Filled 2016-02-11: qty 100

## 2016-02-11 MED ORDER — CHLORHEXIDINE GLUCONATE 4 % EX LIQD
60.0000 mL | Freq: Once | CUTANEOUS | Status: DC
Start: 2016-02-11 — End: 2016-02-11

## 2016-02-11 MED ORDER — BUPIVACAINE IN DEXTROSE 0.75-8.25 % IT SOLN
INTRATHECAL | Status: DC | PRN
  Administered 2016-02-11: 1.6 mL via INTRATHECAL

## 2016-02-11 MED ORDER — LORATADINE 10 MG PO TABS
10.0000 mg | ORAL_TABLET | Freq: Every day | ORAL | Status: DC
  Administered 2016-02-12 – 2016-02-13 (×2): 10 mg via ORAL
  Filled 2016-02-11 (×2): qty 1

## 2016-02-11 MED ORDER — BISACODYL 10 MG RE SUPP: 10.0000 mg | Freq: Every day | RECTAL | Status: DC | PRN

## 2016-02-11 MED ORDER — OXYCODONE HCL 5 MG PO TABS
5.0000 mg | ORAL_TABLET | ORAL | Status: DC | PRN
  Administered 2016-02-11 (×2): 5 mg via ORAL
  Administered 2016-02-12 – 2016-02-13 (×6): 10 mg via ORAL
  Filled 2016-02-11 (×5): qty 2
  Filled 2016-02-11: qty 1
  Filled 2016-02-11: qty 2
  Filled 2016-02-11: qty 1

## 2016-02-11 MED ORDER — MORPHINE SULFATE (PF) 2 MG/ML IV SOLN
2.0000 mg | INTRAVENOUS | Status: DC | PRN
  Administered 2016-02-11 (×3): 2 mg via INTRAVENOUS
  Filled 2016-02-11 (×3): qty 1

## 2016-02-11 MED ORDER — LATANOPROST 0.005 % OP SOLN
1.0000 [drp] | Freq: Every day | OPHTHALMIC | Status: DC
  Administered 2016-02-11: 1 [drp] via OPHTHALMIC
  Filled 2016-02-11: qty 2.5

## 2016-02-11 MED ORDER — ACETAMINOPHEN 650 MG RE SUPP: 650.0000 mg | Freq: Four times a day (QID) | RECTAL | Status: DC | PRN

## 2016-02-11 MED ORDER — METANX 3-90.314-2-35 MG PO CAPS: 1.0000 | ORAL_CAPSULE | Freq: Two times a day (BID) | ORAL | Status: DC

## 2016-02-11 MED ORDER — NEOMYCIN-POLYMYXIN B GU 40-200000 IR SOLN
Status: DC | PRN
  Administered 2016-02-11: 14 mL

## 2016-02-11 MED ORDER — INSULIN ASPART 100 UNIT/ML ~~LOC~~ SOLN
6.0000 [IU] | Freq: Once | SUBCUTANEOUS | Status: AC
  Administered 2016-02-11: 6 [IU] via SUBCUTANEOUS

## 2016-02-11 MED ORDER — FUROSEMIDE 20 MG PO TABS
20.0000 mg | ORAL_TABLET | Freq: Two times a day (BID) | ORAL | Status: DC
  Administered 2016-02-11 – 2016-02-13 (×4): 20 mg via ORAL
  Filled 2016-02-11 (×4): qty 1

## 2016-02-11 MED ORDER — FENTANYL CITRATE (PF) 100 MCG/2ML IJ SOLN
INTRAMUSCULAR | Status: AC
  Administered 2016-02-11: 25 ug via INTRAVENOUS
  Filled 2016-02-11: qty 2

## 2016-02-11 MED ORDER — PLANT STEROLS AND STANOLS 450 MG PO TABS: ORAL_TABLET | Freq: Two times a day (BID) | ORAL | Status: DC

## 2016-02-11 MED ORDER — ACETAMINOPHEN 10 MG/ML IV SOLN
INTRAVENOUS | Status: DC | PRN
  Administered 2016-02-11: 1000 mg via INTRAVENOUS

## 2016-02-11 MED ORDER — INSULIN ASPART 100 UNIT/ML ~~LOC~~ SOLN: 20.0000 [IU] | Freq: Three times a day (TID) | SUBCUTANEOUS | Status: DC

## 2016-02-11 MED ORDER — VITAMIN D 1000 UNITS PO TABS
1000.0000 [IU] | ORAL_TABLET | Freq: Every day | ORAL | Status: DC
  Administered 2016-02-12 – 2016-02-13 (×2): 1000 [IU] via ORAL
  Filled 2016-02-11 (×2): qty 1

## 2016-02-11 MED ORDER — RANOLAZINE ER 500 MG PO TB12
1000.0000 mg | ORAL_TABLET | Freq: Two times a day (BID) | ORAL | Status: DC
  Administered 2016-02-11 – 2016-02-13 (×4): 1000 mg via ORAL
  Filled 2016-02-11 (×4): qty 2

## 2016-02-11 MED ORDER — ALUM & MAG HYDROXIDE-SIMETH 200-200-20 MG/5ML PO SUSP: 30.0000 mL | ORAL | Status: DC | PRN

## 2016-02-11 MED ORDER — MENTHOL 3 MG MT LOZG: 1.0000 | LOZENGE | OROMUCOSAL | Status: DC | PRN

## 2016-02-11 MED ORDER — CEFAZOLIN SODIUM-DEXTROSE 2-4 GM/100ML-% IV SOLN
2.0000 g | INTRAVENOUS | Status: AC
  Administered 2016-02-11: 3 g via INTRAVENOUS

## 2016-02-11 MED ORDER — ACETAMINOPHEN 325 MG PO TABS: 650.0000 mg | ORAL_TABLET | Freq: Four times a day (QID) | ORAL | Status: DC | PRN

## 2016-02-11 MED ORDER — FLEET ENEMA 7-19 GM/118ML RE ENEM: 1.0000 | ENEMA | Freq: Once | RECTAL | Status: DC | PRN

## 2016-02-11 MED ORDER — INSULIN ASPART 100 UNIT/ML ~~LOC~~ SOLN
0.0000 [IU] | Freq: Three times a day (TID) | SUBCUTANEOUS | Status: DC
  Administered 2016-02-11: 3 [IU] via SUBCUTANEOUS
  Administered 2016-02-12: 15 [IU] via SUBCUTANEOUS
  Filled 2016-02-11: qty 3
  Filled 2016-02-11: qty 15

## 2016-02-11 MED ORDER — METOPROLOL TARTRATE 25 MG PO TABS
25.0000 mg | ORAL_TABLET | Freq: Two times a day (BID) | ORAL | Status: DC
  Administered 2016-02-11 – 2016-02-13 (×4): 25 mg via ORAL
  Filled 2016-02-11 (×4): qty 1

## 2016-02-11 MED ORDER — ONDANSETRON HCL 4 MG/2ML IJ SOLN: 4.0000 mg | Freq: Four times a day (QID) | INTRAMUSCULAR | Status: DC | PRN

## 2016-02-11 MED ORDER — TETRACAINE HCL 1 % IJ SOLN
INTRAMUSCULAR | Status: DC | PRN
  Administered 2016-02-11: 3 mg via INTRASPINAL

## 2016-02-11 MED ORDER — ACETAMINOPHEN 10 MG/ML IV SOLN
INTRAVENOUS | Status: AC
  Filled 2016-02-11: qty 100

## 2016-02-11 MED ORDER — SODIUM CHLORIDE 0.9 % IV SOLN
INTRAVENOUS | Status: DC
  Administered 2016-02-11 – 2016-02-12 (×2): via INTRAVENOUS

## 2016-02-11 MED ORDER — SODIUM CHLORIDE 0.9 % IV SOLN: Freq: Once | INTRAVENOUS | Status: DC

## 2016-02-11 MED ORDER — ONDANSETRON HCL 4 MG PO TABS: 4.0000 mg | ORAL_TABLET | Freq: Four times a day (QID) | ORAL | Status: DC | PRN

## 2016-02-11 MED ORDER — CEFAZOLIN SODIUM-DEXTROSE 2-4 GM/100ML-% IV SOLN
2.0000 g | Freq: Four times a day (QID) | INTRAVENOUS | Status: AC
  Administered 2016-02-11 – 2016-02-12 (×4): 2 g via INTRAVENOUS
  Filled 2016-02-11 (×6): qty 100

## 2016-02-11 MED ORDER — MIDAZOLAM HCL 5 MG/5ML IJ SOLN
INTRAMUSCULAR | Status: DC | PRN
  Administered 2016-02-11: 2 mg via INTRAVENOUS

## 2016-02-11 MED ORDER — BUPIVACAINE LIPOSOME 1.3 % IJ SUSP
INTRAMUSCULAR | Status: AC
  Filled 2016-02-11: qty 20

## 2016-02-11 MED ORDER — FENTANYL CITRATE (PF) 100 MCG/2ML IJ SOLN
25.0000 ug | INTRAMUSCULAR | Status: DC | PRN
  Administered 2016-02-11 (×4): 25 ug via INTRAVENOUS

## 2016-02-11 MED ORDER — VITAMIN C 500 MG PO TABS
500.0000 mg | ORAL_TABLET | Freq: Two times a day (BID) | ORAL | Status: DC
  Administered 2016-02-11 – 2016-02-13 (×4): 500 mg via ORAL
  Filled 2016-02-11 (×4): qty 1

## 2016-02-11 MED ORDER — METOCLOPRAMIDE HCL 10 MG PO TABS
10.0000 mg | ORAL_TABLET | Freq: Three times a day (TID) | ORAL | Status: AC
  Administered 2016-02-11 – 2016-02-13 (×8): 10 mg via ORAL
  Filled 2016-02-11 (×8): qty 1

## 2016-02-11 MED ORDER — INSULIN ASPART 100 UNIT/ML ~~LOC~~ SOLN
SUBCUTANEOUS | Status: AC
  Administered 2016-02-11: 6 [IU] via SUBCUTANEOUS
  Filled 2016-02-11: qty 6

## 2016-02-11 SURGICAL SUPPLY — 57 items
AUTOTRANSFUS HAS 1/8 (MISCELLANEOUS) ×2
BATTERY INSTRU NAVIGATION (MISCELLANEOUS) ×8 IMPLANT
BLADE SAW 1 (BLADE) ×2 IMPLANT
BLADE SAW 1/2 (BLADE) ×2 IMPLANT
BONE CEMENT GENTAMICIN (Cement) ×4 IMPLANT
CANISTER SUCT 1200ML W/VALVE (MISCELLANEOUS) ×2 IMPLANT
CANISTER SUCT 3000ML (MISCELLANEOUS) ×4 IMPLANT
CAPT KNEE TOTAL 3 ATTUNE ×2 IMPLANT
CATH TRAY METER 16FR LF (MISCELLANEOUS) ×2 IMPLANT
CEMENT BONE GENTAMICIN 40 (Cement) ×2 IMPLANT
COOLER POLAR GLACIER W/PUMP (MISCELLANEOUS) ×2 IMPLANT
DRAPE SHEET LG 3/4 BI-LAMINATE (DRAPES) ×2 IMPLANT
DRSG DERMACEA 8X12 NADH (GAUZE/BANDAGES/DRESSINGS) ×2 IMPLANT
DRSG OPSITE POSTOP 4X14 (GAUZE/BANDAGES/DRESSINGS) ×2 IMPLANT
DRSG TEGADERM 4X4.75 (GAUZE/BANDAGES/DRESSINGS) ×2 IMPLANT
DURAPREP 26ML APPLICATOR (WOUND CARE) ×4 IMPLANT
ELECT CAUTERY BLADE 6.4 (BLADE) ×2 IMPLANT
ELECT REM PT RETURN 9FT ADLT (ELECTROSURGICAL) ×2
ELECTRODE REM PT RTRN 9FT ADLT (ELECTROSURGICAL) ×1 IMPLANT
EX-PIN ORTHOLOCK NAV 4X150 (PIN) ×4 IMPLANT
GLOVE BIOGEL M STRL SZ7.5 (GLOVE) ×4 IMPLANT
GLOVE INDICATOR 8.0 STRL GRN (GLOVE) ×2 IMPLANT
GLOVE SURG 9.0 ORTHO LTXF (GLOVE) ×2 IMPLANT
GLOVE SURG ORTHO 9.0 STRL STRW (GLOVE) ×2 IMPLANT
GOWN STRL REUS W/ TWL LRG LVL3 (GOWN DISPOSABLE) ×2 IMPLANT
GOWN STRL REUS W/TWL 2XL LVL3 (GOWN DISPOSABLE) ×2 IMPLANT
GOWN STRL REUS W/TWL LRG LVL3 (GOWN DISPOSABLE) ×2
HANDPIECE SUCTION TUBG SURGILV (MISCELLANEOUS) ×2 IMPLANT
HOLDER FOLEY CATH W/STRAP (MISCELLANEOUS) ×2 IMPLANT
HOOD PEEL AWAY FLYTE STAYCOOL (MISCELLANEOUS) ×4 IMPLANT
KIT RM TURNOVER STRD PROC AR (KITS) ×2 IMPLANT
KNIFE SCULPS 14X20 (INSTRUMENTS) ×2 IMPLANT
NDL SAFETY 18GX1.5 (NEEDLE) ×2 IMPLANT
NEEDLE SPNL 20GX3.5 QUINCKE YW (NEEDLE) ×2 IMPLANT
NS IRRIG 500ML POUR BTL (IV SOLUTION) ×2 IMPLANT
PACK TOTAL KNEE (MISCELLANEOUS) ×2 IMPLANT
PAD WRAPON POLAR KNEE (MISCELLANEOUS) ×1 IMPLANT
PIN DRILL QUICK PACK ×2 IMPLANT
PIN FIXATION 1/8DIA X 3INL (PIN) ×2 IMPLANT
SOL .9 NS 3000ML IRR  AL (IV SOLUTION) ×1
SOL .9 NS 3000ML IRR UROMATIC (IV SOLUTION) ×1 IMPLANT
SOL PREP PVP 2OZ (MISCELLANEOUS) ×2
SOLUTION PREP PVP 2OZ (MISCELLANEOUS) ×1 IMPLANT
SPONGE DRAIN TRACH 4X4 STRL 2S (GAUZE/BANDAGES/DRESSINGS) ×2 IMPLANT
STAPLER SKIN PROX 35W (STAPLE) ×2 IMPLANT
SUCTION FRAZIER HANDLE 10FR (MISCELLANEOUS) ×1
SUCTION TUBE FRAZIER 10FR DISP (MISCELLANEOUS) ×1 IMPLANT
SUT VIC AB 0 CT1 36 (SUTURE) ×2 IMPLANT
SUT VIC AB 1 CT1 36 (SUTURE) ×4 IMPLANT
SUT VIC AB 2-0 CT2 27 (SUTURE) ×2 IMPLANT
SYR 20CC LL (SYRINGE) ×2 IMPLANT
SYR 30ML LL (SYRINGE) ×2 IMPLANT
SYR 50ML LL SCALE MARK (SYRINGE) ×2 IMPLANT
SYSTEM AUTOTRANSFUS DUAL TROCR (MISCELLANEOUS) ×1 IMPLANT
TOWEL OR 17X26 4PK STRL BLUE (TOWEL DISPOSABLE) ×2 IMPLANT
TOWER CARTRIDGE SMART MIX (DISPOSABLE) ×2 IMPLANT
WRAPON POLAR PAD KNEE (MISCELLANEOUS) ×2

## 2016-02-11 NOTE — Anesthesia Preprocedure Evaluation (Addendum)
Anesthesia Evaluation  Patient identified by MRN, date of birth, ID band Patient awake    Reviewed: Allergy & Precautions, NPO status , Patient's Chart, lab work & pertinent test results, reviewed documented beta blocker date and time   Airway Mallampati: III  TM Distance: >3 FB     Dental  (+) Chipped, Partial Upper, Dental Advisory Given   Pulmonary sleep apnea , COPD, former smoker,           Cardiovascular hypertension, Pt. on medications and Pt. on home beta blockers + CAD and + Past MI       Neuro/Psych    GI/Hepatic GERD  ,  Endo/Other  diabetes  Renal/GU      Musculoskeletal  (+) Arthritis ,   Abdominal   Peds  Hematology   Anesthesia Other Findings Obese. MI in 2008. Will use CPAP tonite. Poor dentition.  Reproductive/Obstetrics                            Anesthesia Physical Anesthesia Plan  ASA: III  Anesthesia Plan: Spinal   Post-op Pain Management:    Induction:   Airway Management Planned:   Additional Equipment:   Intra-op Plan:   Post-operative Plan:   Informed Consent: I have reviewed the patients History and Physical, chart, labs and discussed the procedure including the risks, benefits and alternatives for the proposed anesthesia with the patient or authorized representative who has indicated his/her understanding and acceptance.     Plan Discussed with: CRNA  Anesthesia Plan Comments:         Anesthesia Quick Evaluation

## 2016-02-11 NOTE — Progress Notes (Signed)
PHARMACIST - PHYSICIAN ORDER COMMUNICATION  CONCERNING: P&T Medication Policy on Herbal Medications  DESCRIPTION:  This patient's order for:  Metanx, Plant Sterols and Stanols  has been noted.  This product(s) is classified as an "herbal" or natural product. Due to a lack of definitive safety studies or FDA approval, nonstandard manufacturing practices, plus the potential risk of unknown drug-drug interactions while on inpatient medications, the Pharmacy and Therapeutics Committee does not permit the use of "herbal" or natural products of this type within [facility].   ACTION TAKEN: The pharmacy department is unable to verify this order at this time and your patient has been informed of this safety policy. Please reevaluate patient's clinical condition at discharge and address if the herbal or natural product(s) should be resumed at that time.

## 2016-02-11 NOTE — Transfer of Care (Signed)
Immediate Anesthesia Transfer of Care Note  Patient: Lawrence [name]  Procedure(s) Performed: Procedure(s): COMPUTER ASSISTED TOTAL KNEE ARTHROPLASTY (Left)  Patient Location: PACU  Anesthesia Type:Spinal  Level of Consciousness: awake, alert  and oriented  Airway & Oxygen Therapy: Patient Spontanous Breathing and Patient connected to face mask oxygen  Post-op Assessment: Report given to RN  Post vital signs: Reviewed and stable  Last Vitals:  Filed Vitals:   02/11/16 1008 02/11/16 1458  BP: 148/58 134/66  Pulse: 69 67  Temp: 36.7 C 37 C  Resp: 16 15    Complications: No apparent anesthesia complications

## 2016-02-11 NOTE — Progress Notes (Signed)
Nurse reported output for 1st autovac 425 ml blood,  to be infused at 1800.

## 2016-02-11 NOTE — Progress Notes (Signed)
Pt has CPAP at bedside, Respiratory inspected all equipment and verified. [place] for use.

## 2016-02-11 NOTE — H&P (Signed)
The patient has been re-examined, and the chart reviewed, and there have been no interval changes to the documented history and physical.    The risks, benefits, and alternatives have been discussed at length. The patient expressed understanding of the risks benefits and agreed with plans for surgical intervention.  James P. Hooten, Jr. M.D.    

## 2016-02-11 NOTE — Anesthesia Procedure Notes (Addendum)
Performed by: [id number], DAVID Oxygen Delivery Method: Simple face mask   Spinal Patient location during procedure: OR Staffing Anesthesiologist: Gunnar Bulla Performed by: anesthesiologist  Preanesthetic Checklist Completed: patient identified, site marked, surgical consent, pre-op evaluation, timeout performed, IV checked and risks and benefits discussed Spinal Block Patient position: sitting Prep: Betadine Patient monitoring: heart rate, cardiac monitor, continuous pulse ox and blood pressure Approach: midline Location: L3-4 Injection technique: single-shot Needle Needle type: Pencil-Tip  Needle gauge: 25 G Needle length: 9 cm Assessment Sensory level: T10 Additional Notes Marcaine 12mg  and tetracaine 3 mg.

## 2016-02-11 NOTE — Progress Notes (Addendum)
Med verification with pt at bedside. Pt states he does not take 20 units Novolog with each meal. Uses sliding scale for meals at home, Lantus 15 units at night.  Paged MD (Dr. Rudene Christians on call), plan to hold this medication order until evaluated by MD (Dr. Marry Guan) and/or diabetes coordinator.  Unable to place order on hold, d/c'd order to prevent med error. Information given to oncoming nurse to follow up tomorrow.

## 2016-02-11 NOTE — Brief Op Note (Signed)
02/11/2016  2:59 PM  PATIENT:  Lawrence [name]  65 y.o. male  PRE-OPERATIVE DIAGNOSIS:  PRIMARY OSTEOARTHRITIS of the left knee  POST-OPERATIVE DIAGNOSIS:  Same  PROCEDURE:  Procedure(s): COMPUTER ASSISTED TOTAL KNEE ARTHROPLASTY (Left)  SURGEON:  Surgeon(s) and Role:    * Dereck Leep, MD - Primary  ASSISTANTS: Vance Peper, PA   ANESTHESIA:   spinal  EBL:  Total I/O In: 1600 [I.V.:1600] Out: 200 [Urine:150; Blood:50]  BLOOD ADMINISTERED:none  DRAINS: 2 medium drains to a reinfusion system   LOCAL MEDICATIONS USED:  MARCAINE    and OTHER Exparel  SPECIMEN:  No Specimen  DISPOSITION OF SPECIMEN:  N/A  COUNTS:  YES  TOURNIQUET:   111 minutes  DICTATION: .Dragon Dictation  PLAN OF CARE: Admit to inpatient   PATIENT DISPOSITION:  PACU - hemodynamically stable.   Delay start of Pharmacological VTE agent (>24hrs) due to surgical blood loss or risk of bleeding: not applicable

## 2016-02-11 NOTE — Op Note (Signed)
OPERATIVE NOTE  DATE OF SURGERY:  02/11/2016  PATIENT NAME:  Lawrence [name]   DOB: Jul 09, 1951  MRN: [id number]  PRE-OPERATIVE DIAGNOSIS: Degenerative arthrosis of the left knee, primary  POST-OPERATIVE DIAGNOSIS:  Same  PROCEDURE:  Left total knee arthroplasty using computer-assisted navigation  SURGEON:  Marciano Sequin. M.D.  ASSISTANT:  Vance Peper, PA (present and scrubbed throughout the case, critical for assistance with exposure, retraction, instrumentation, and closure)  ANESTHESIA: spinal  ESTIMATED BLOOD LOSS: 50 mL  FLUIDS REPLACED: 1600 mL of crystalloid  TOURNIQUET TIME: 111 minutes  DRAINS: 2 medium drains to a reinfusion system  SOFT TISSUE RELEASES: Anterior cruciate ligament, posterior cruciate ligament, deep and superficial medial collateral ligament, patellofemoral ligament   IMPLANTS UTILIZED: DePuy Attune size 8 posterior stabilized femoral component (cemented), size 8 rotating platform tibial component (cemented), 41 mm medialized dome patella (cemented), and a 5 mm stabilized rotating platform polyethylene insert.  INDICATIONS FOR SURGERY: Lawrence [name] is a 65 y.o. year old male with a long history of progressive knee pain. X-rays demonstrated severe degenerative changes in tricompartmental fashion. The patient had not seen any significant improvement despite conservative nonsurgical intervention. After discussion of the risks and benefits of surgical intervention, the patient expressed understanding of the risks benefits and agree with plans for total knee arthroplasty.   The risks, benefits, and alternatives were discussed at length including but not limited to the risks of infection, bleeding, nerve injury, stiffness, blood clots, the need for revision surgery, cardiopulmonary complications, among others, and they were willing to proceed.  PROCEDURE IN DETAIL: The patient was brought into the operating room and, after adequate spinal anesthesia was  achieved, a tourniquet was placed on the patient's upper thigh. The patient's knee and leg were cleaned and prepped with alcohol and DuraPrep and draped in the usual sterile fashion. A "timeout" was performed as per usual protocol. The lower extremity was exsanguinated using an Esmarch, and the tourniquet was inflated to 300 mmHg. An anterior longitudinal incision was made followed by a standard mid vastus approach. The deep fibers of the medial collateral ligament were elevated in a subperiosteal fashion off of the medial flare of the tibia so as to maintain a continuous soft tissue sleeve. The patella was subluxed laterally and the patellofemoral ligament was incised. Inspection of the knee demonstrated severe degenerative changes with full-thickness loss of articular cartilage. Osteophytes were debrided using a rongeur. Anterior and posterior cruciate ligaments were excised. Two 4.0 mm Schanz pins were inserted in the femur and into the tibia for attachment of the array of trackers used for computer-assisted navigation. Hip center was identified using a circumduction technique. Distal landmarks were mapped using the computer. The distal femur and proximal tibia were mapped using the computer. The distal femoral cutting guide was positioned using computer-assisted navigation so as to achieve a 5 distal valgus cut. The femur was sized and it was felt that a size 8 femoral component was appropriate. A size 8 femoral cutting guide was positioned and the anterior cut was performed and verified using the computer. This was followed by completion of the posterior and chamfer cuts. Femoral cutting guide for the central box was then positioned in the center box cut was performed.  Attention was then directed to the proximal tibia. Medial and lateral menisci were excised. The extramedullary tibial cutting guide was positioned using computer-assisted navigation so as to achieve a 0 varus-valgus alignment and 3  posterior slope. The cut was performed and verified  using the computer. The proximal tibia was sized and it was felt that a size 8 tibial tray was appropriate. Tibial and femoral trials were inserted followed by insertion of a 5 mm polyethylene insert. The knee was felt to be tight medially and also lacks full extension. A Cobb elevator was used to elevate the superficial fibers of medial collateral ligament. This improved mediolateral soft tissue balancing both still resulting in lack of full extension. Trial components were removed and the extramedullary tibial cutting guide was repositioned so as to resect an additional 2 mm of bone off of the tibia. Cut was performed and components reinserted. This allowed for excellent mediolateral soft tissue balancing both in flexion and in full extension. Finally, the patella was cut and prepared so as to accommodate a 41 mm medialized dome patella. A patella trial was placed and the knee was placed through a range of motion with excellent patellar tracking appreciated. The femoral trial was removed after debridement of posterior osteophytes. The central post-hole for the tibial component was reamed followed by insertion of a keel punch. Tibial trials were then removed. Cut surfaces of bone were irrigated with copious amounts of normal saline with antibiotic solution using pulsatile lavage and then suctioned dry. Polymethylmethacrylate cement with gentamicin was prepared in the usual fashion using a vacuum mixer. Cement was applied to the cut surface of the proximal tibia as well as along the undersurface of a size 8 rotating platform tibial component. Tibial component was positioned and impacted into place. Excess cement was removed using [employer] fast streamer. Cement was then applied to the cut surfaces of the femur as well as along the posterior flanges of the size 8 femoral component. The femoral component was positioned and impacted into place. Excess cement was removed using  [employer] fast streamer. A 5 mm polyethylene trial was inserted and the knee was brought into full extension with steady axial compression applied. Finally, cement was applied to the backside of a 41 mm medialized dome patella and the patellar component was positioned and patellar clamp applied. Excess cement was removed using [employer] fast streamer. After adequate curing of the cement, the tourniquet was deflated after a total tourniquet time of 111 minutes. Hemostasis was achieved using electrocautery. The knee was irrigated with copious amounts of normal saline with antibiotic solution using pulsatile lavage and then suctioned dry. 20 mL of 1.3% Exparel in 40 mL of normal saline was injected along the posterior capsule, medial and lateral gutters, and along the arthrotomy site. A 5 mm stabilized rotating platform polyethylene insert was inserted and the knee was placed through a range of motion with excellent mediolateral soft tissue balancing appreciated and excellent patellar tracking noted. 2 medium drains were placed in the wound bed and brought out through separate stab incisions to be attached to a reinfusion system. The medial parapatellar portion of the incision was reapproximated using interrupted sutures of #1 Vicryl. Subcutaneous tissue was then injected with a total of 30 cc of 0.25% Marcaine with epinephrine. Subcutaneous tissue was approximated in layers using first #0 Vicryl followed #2-0 Vicryl. The skin was approximated with skin staples. A sterile dressing was applied.  The patient tolerated the procedure well and was transported to the recovery room in stable condition.    James P. Holley Bouche., M.D.

## 2016-02-12 ENCOUNTER — Encounter: Payer: Self-pay | Admitting: Orthopedic Surgery

## 2016-02-12 LAB — GLUCOSE, CAPILLARY
GLUCOSE-CAPILLARY: 432 mg/dL — AB (ref 65–99)
GLUCOSE-CAPILLARY: 403 mg/dL — AB (ref 65–99)
GLUCOSE-CAPILLARY: 312 mg/dL — AB (ref 65–99)
Glucose-Capillary: 427 mg/dL — ABNORMAL HIGH (ref 65–99)
Glucose-Capillary: 453 mg/dL — ABNORMAL HIGH (ref 65–99)
Glucose-Capillary: 452 mg/dL — ABNORMAL HIGH (ref 65–99)
Glucose-Capillary: 247 mg/dL — ABNORMAL HIGH (ref 65–99)
Glucose-Capillary: 178 mg/dL — ABNORMAL HIGH (ref 65–99)
Glucose-Capillary: 222 mg/dL — ABNORMAL HIGH (ref 65–99)

## 2016-02-12 LAB — BASIC METABOLIC PANEL
Anion gap: 10 (ref 5–15)
BUN: 20 mg/dL (ref 6–20)
CHLORIDE: 97 mmol/L — AB (ref 101–111)
CO2: 22 mmol/L (ref 22–32)
CREATININE: 1.04 mg/dL (ref 0.61–1.24)
Calcium: 7.7 mg/dL — ABNORMAL LOW (ref 8.9–10.3)
GFR calc Af Amer: >60 mL/min (ref >60)
GFR calc non Af Amer: >60 mL/min (ref >60)
Glucose, Bld: 447 mg/dL — ABNORMAL HIGH (ref 65–99)
Potassium: 4.2 mmol/L (ref 3.5–5.1)
SODIUM: 129 mmol/L — AB (ref 135–145)

## 2016-02-12 LAB — CBC
HEMATOCRIT: 33.8 % — AB (ref 40.0–52.0)
HEMOGLOBIN: 11.5 g/dL — AB (ref 13.0–18.0)
MCH: 32.3 pg (ref 26.0–34.0)
MCHC: 34 g/dL (ref 32.0–36.0)
MCV: 94.8 fL (ref 80.0–100.0)
Platelets: 153 10*3/uL (ref 150–440)
RBC: 3.57 MIL/uL — ABNORMAL LOW (ref 4.40–5.90)
RDW: 13.2 % (ref 11.5–14.5)
WBC: 10.8 10*3/uL — ABNORMAL HIGH (ref 3.8–10.6)

## 2016-02-12 MED ORDER — INSULIN ASPART 100 UNIT/ML ~~LOC~~ SOLN
10.0000 [IU] | Freq: Three times a day (TID) | SUBCUTANEOUS | Status: DC
  Administered 2016-02-12: 10 [IU] via SUBCUTANEOUS
  Filled 2016-02-12: qty 10

## 2016-02-12 MED ORDER — OXYCODONE HCL 5 MG PO TABS: 5.0000 mg | ORAL_TABLET | ORAL | Status: DC | PRN

## 2016-02-12 MED ORDER — INSULIN ASPART 100 UNIT/ML ~~LOC~~ SOLN
0.0000 [IU] | Freq: Three times a day (TID) | SUBCUTANEOUS | Status: DC
  Administered 2016-02-13: 15 [IU] via SUBCUTANEOUS
  Administered 2016-02-13: 13 [IU] via SUBCUTANEOUS
  Filled 2016-02-12: qty 13
  Filled 2016-02-12: qty 15

## 2016-02-12 MED ORDER — INSULIN ASPART 100 UNIT/ML ~~LOC~~ SOLN
12.0000 [IU] | Freq: Three times a day (TID) | SUBCUTANEOUS | Status: DC
  Administered 2016-02-12: 12 [IU] via SUBCUTANEOUS
  Filled 2016-02-12: qty 12

## 2016-02-12 MED ORDER — INSULIN GLARGINE 100 UNIT/ML ~~LOC~~ SOLN
22.0000 [IU] | Freq: Every day | SUBCUTANEOUS | Status: DC
  Administered 2016-02-12: 22 [IU] via SUBCUTANEOUS
  Filled 2016-02-12 (×2): qty 0.22

## 2016-02-12 MED ORDER — TRAMADOL HCL 50 MG PO TABS: 50.0000 mg | ORAL_TABLET | ORAL | Status: DC | PRN

## 2016-02-12 MED ORDER — INSULIN ASPART 100 UNIT/ML ~~LOC~~ SOLN: 0.0000 [IU] | Freq: Every day | SUBCUTANEOUS | Status: DC

## 2016-02-12 NOTE — Progress Notes (Signed)
Pt requested fingerstick to be done. Resulted as 457. Notified Dr Rudene Christians. Received order for medical consult

## 2016-02-12 NOTE — Consult Note (Addendum)
[facility] [place] NAME: Lawrence [name]    MR#:  [id number]  DATE OF BIRTH:  1951/07/31  DATE OF ADMISSION:  02/11/2016  PRIMARY CARE PHYSICIAN: Sheral Flow, NP   CONSULT REQUESTING/REFERRING PHYSICIAN: Dr. Marry Guan  REASON FOR CONSULT: Uncontrolled DM  CHIEF COMPLAINT:  No chief complaint on file.  Osteoarthritis. Uncontrolled diabetes.  HISTORY OF PRESENT ILLNESS:  Lawrence [name]  is a 65 y.o. male with a known history of Insulin-dependent diabetes mellitus, CAD, osteoarthritis admitted to orthopedic service for left knee total arthroplasty. Presently patient's main concern is left knee pain which is being controlled with oral and IV medications. Here in the hospital patient's blood sugars have been significantly elevated greater than 300. At home his blood sugars are consistently over 200. He takes 15 units Lantus at home and pre-meal insulin counting carbs with 1 unit for 4 g of carb. Follows with endocrinologist at [facility]. Here patient's blood sugars were significantly elevated in spite of being 15 units Lantus and pre-meal insulin.  Patient did have low blood sugar of 39 on Sunday morning. This was after he took 15 units of NovoLog for blood sugar greater than 400 at 1:30 AM. No other hypoglycemic episodes recently.  PAST MEDICAL HISTORY:   Past Medical History  Diagnosis Date  . Muscle pain   . Sinus complaint   . Dizzy   . Glaucoma   . Osteoarthritis   . Diabetes (HCC)   . Fatigue   . Night sweats   . Poor circulation   . HBP (high blood pressure)   . Skin cancer   . COPD, mild (HCC)   . Bruises easily   . Elevated lipids   . Neuropathy (HCC)   . Sleep apnea   . CAD (coronary artery disease)     09 /2008    PAST SURGICAL HISTOIRY:   Past Surgical History  Procedure Laterality Date  . Eye surgery Bilateral   . Appendectomy    . Cataract extraction w/ intraocular lens  implant, bilateral Bilateral    . Knee arthroscopy Right   . Knee arthroplasty Left 02/11/2016    Procedure: COMPUTER ASSISTED TOTAL KNEE ARTHROPLASTY;  Surgeon: Dereck Leep, MD;  Location: [facility] ORS;  Service: Orthopedics;  Laterality: Left;    SOCIAL HISTORY:   Social History  Substance Use Topics  . Smoking status: Former Smoker -- 1.00 packs/day for 16 years    Types: Cigarettes    Start date: 11/03/1969    Quit date: 07/22/1986  . Smokeless tobacco: Never Used  . Alcohol Use: 0.0 oz/week    0 Standard drinks or equivalent per week     Comment: OCCASIONALLY    FAMILY HISTORY:   Family History  Problem Relation Age of Onset  . Hypertension Mother   . Lupus Mother   . Hypertension Father   . Diabetes Father   . Arthritis Maternal Grandmother   . Arthritis Maternal Grandfather   . Arthritis Paternal Grandmother   . Arthritis Paternal Grandfather     DRUG ALLERGIES:   Allergies  Allergen Reactions  . Imdur [Isosorbide Nitrate] Nausea And Vomiting, Other (See Comments) and Nausea Only  . Statins Other (See Comments)    BODY PAIN BODY PAIN  . Lipitor [Atorvastatin] Other (See Comments) and Rash    BODY PAIN    REVIEW OF SYSTEMS:   ROS  CONSTITUTIONAL: No fever, fatigue or weakness.  EYES: No blurred or double vision.  EARS,  NOSE, AND THROAT: No tinnitus or ear pain.  RESPIRATORY: No cough, shortness of breath, wheezing or hemoptysis.  CARDIOVASCULAR: No chest pain, orthopnea, edema.  GASTROINTESTINAL: No nausea, vomiting, diarrhea or abdominal pain.  GENITOURINARY: No dysuria, hematuria.  ENDOCRINE: No polyuria, nocturia,  HEMATOLOGY: No anemia, easy bruising or bleeding SKIN: No rash or lesion. MUSCULOSKELETAL: Arthritis. Left knee pain NEUROLOGIC: No tingling, numbness, weakness.  PSYCHIATRY: No anxiety or depression.   MEDICATIONS AT HOME:   Prior to Admission medications   Medication Sig Start Date End Date Taking? Authorizing Provider  acetaminophen (TYLENOL) 500 MG tablet  Take 500 mg by mouth every 6 (six) hours as needed.   Yes Historical Provider, MD  cetirizine (ZYRTEC) 10 MG tablet Take 10 mg by mouth daily.   Yes Historical Provider, MD  cholecalciferol (VITAMIN D) 1000 units tablet Take 1,000 Units by mouth daily.   Yes Historical Provider, MD  docusate sodium (COLACE) 100 MG capsule Take 200 mg by mouth 2 (two) times daily.   Yes Historical Provider, MD  furosemide (LASIX) 20 MG tablet Take 20 mg by mouth 2 (two) times daily.  05/22/15 05/21/16 Yes Historical Provider, MD  insulin aspart (NOVOLOG) 100 UNIT/ML injection Inject 20 Units into the skin 3 (three) times daily.    Yes Historical Provider, MD  insulin glargine (LANTUS) 100 UNIT/ML injection Inject 20 Units into the skin at bedtime.    Yes Historical Provider, MD  L-Methylfolate-Algae-B12-B6 (METANX) 3-90.314-2-35 MG CAPS TAKE ONE (1) CAPSULE BY MOUTH 2 TIMES DAILY 01/10/16  Yes Max T Hyatt, DPM  latanoprost (XALATAN) 0.005 % ophthalmic solution Place 1 drop into both eyes at bedtime.   Yes Historical Provider, MD  meloxicam (MOBIC) 15 MG tablet Take 1 tablet by mouth daily. 11/26/15  Yes Historical Provider, MD  metoprolol tartrate (LOPRESSOR) 25 MG tablet Take 25 mg by mouth 2 (two) times daily.   Yes Historical Provider, MD  Multiple Vitamin (MULTIVITAMIN) capsule Take 1 capsule by mouth daily.   Yes Historical Provider, MD  omeprazole (PRILOSEC) 20 MG capsule Take 20 mg by mouth daily.   Yes Historical Provider, MD  Plant Sterols and Stanols (CHOLESTOFF PO) Take 2 capsules by mouth 2 (two) times daily.   Yes Historical Provider, MD  ranolazine (RANEXA) 1000 MG SR tablet TAKE 1 TABLET TWICE A DAY 02/28/15  Yes Historical Provider, MD  vitamin C (ASCORBIC ACID) 500 MG tablet Take 500 mg by mouth 2 (two) times daily.    Yes Historical Provider, MD  glucagon (GLUCAGON EMERGENCY) 1 MG injection Inject 1 mg into the vein once as needed. Reported on 02/11/2016    Historical Provider, MD  oxyCODONE (OXY  IR/ROXICODONE) 5 MG immediate release tablet Take 1-2 tablets (5-10 mg total) by mouth every 4 (four) hours as needed for severe pain or breakthrough pain. 02/12/16   Watt Climes, PA  traMADol (ULTRAM) 50 MG tablet Take 1-2 tablets (50-100 mg total) by mouth every 4 (four) hours as needed for moderate pain. 02/12/16   Watt Climes, PA      VITAL SIGNS:  Blood pressure 140/51, pulse 70, temperature 98.4 F (36.9 C), temperature source Oral, resp. rate 20, height 5\' 10"  (1.778 m), weight 135.399 kg (298 lb 8 oz), SpO2 98 %.  PHYSICAL EXAMINATION:  GENERAL:  65 y.o.-year-old patient lying in the bed with no acute distress. Obese EYES: Pupils equal, round, reactive to light and accommodation. No scleral icterus. Extraocular muscles intact.  HEENT: Head atraumatic, normocephalic. Oropharynx and  nasopharynx clear.  NECK:  Supple, no jugular venous distention. No thyroid enlargement, no tenderness.  LUNGS: Normal breath sounds bilaterally, no wheezing, rales,rhonchi or crepitation. No use of accessory muscles of respiration.  CARDIOVASCULAR: S1, S2 normal. No murmurs, rubs, or gallops.  ABDOMEN: Soft, nontender, nondistended. Bowel sounds present. No organomegaly or mass.  EXTREMITIES: No pedal edema, cyanosis, or clubbing. Dressing over left knee with drain.  NEUROLOGIC: Cranial nerves II through XII are intact. Muscle strength 5/5 in all extremities. Sensation intact. Gait not checked.  PSYCHIATRIC: The patient is alert and oriented x 3.  SKIN: No obvious rash, lesion, or ulcer.   LABORATORY PANEL:   CBC  Recent Labs Lab 02/12/16 0508  WBC 10.8*  HGB 11.5*  HCT 33.8*  PLT 153   ------------------------------------------------------------------------------------------------------------------  Chemistries   Recent Labs Lab 02/12/16 0508  NA 129*  K 4.2  CL 97*  CO2 22  GLUCOSE 447*  BUN 20  CREATININE 1.04  CALCIUM 7.7*    ------------------------------------------------------------------------------------------------------------------  Cardiac Enzymes No results for input(s): TROPONINI in the last 168 hours. ------------------------------------------------------------------------------------------------------------------  RADIOLOGY:  Dg Knee Left Port  02/11/2016  CLINICAL DATA:  Postop left knee replacement EXAM: PORTABLE LEFT KNEE - 1-2 VIEW COMPARISON:  None. FINDINGS: Changes of left knee replacement. No hardware or bony complicating feature. Soft tissue drains in place. IMPRESSION: Left knee replacement.  No complicating feature. Electronically Signed   By: Rolm Baptise M.D.   On: 02/11/2016 15:28    EKG:   Orders placed or performed in visit on 10/11/13  . EKG 12-Lead    IMPRESSION AND PLAN:   * Controlled insulin-dependent diabetes mellitus Check HbA1c Increase Lantus to 22 units daily at night. Pre-meal insulin 10 units NovoLog. Accu-Cheks before meals and at bedtime. Follow blood sugars.  * CAD Continue metoprolol perioperatively.  * Pseudohyponatremia due to elevated blood sugars  * Total knee arthroplasty, Left  * DVT prophylaxis as per orthopedics  All the records are reviewed. Management plans discussed with the patient, family and they are in agreement.  TOTAL TIME TAKING CARE OF THIS PATIENT: 40 minutes.   Hillary Bow R M.D on 02/12/2016 at 12:54 PM  Between 7am to 6pm - Pager - [phone]  After 6pm go to [email or web address] - password [email or web address] [facility]  Office  [phone]  CC: Primary care Physician: Sheral Flow, NP  Note: This dictation was prepared with Dragon dictation along with smaller phrase technology. Any transcriptional errors that result from this process are unintentional.

## 2016-02-12 NOTE — Anesthesia Postprocedure Evaluation (Signed)
Anesthesia Post Note  Patient: NATHANEIL BORCHERS  Procedure(s) Performed: Procedure(s) (LRB): COMPUTER ASSISTED TOTAL KNEE ARTHROPLASTY (Left)  Patient location during evaluation: Nursing Unit Anesthesia Type: Spinal Level of consciousness: awake and alert and oriented Pain management: pain level controlled Vital Signs Assessment: post-procedure vital signs reviewed and stable Respiratory status: spontaneous breathing Cardiovascular status: stable Postop Assessment: no headache Anesthetic complications: no    Last Vitals:  Filed Vitals:   02/11/16 2023 02/12/16 0427  BP: 140/54 122/54  Pulse: 80 74  Temp: 36.8 C 36.7 C  Resp: 18 18    Last Pain:  Filed Vitals:   02/12/16 0519  PainSc: 3                  Irvin Bastin,  Clearnce Sorrel

## 2016-02-12 NOTE — Progress Notes (Signed)
Pts BG 403, PA Vance Peper notified at  nurses station. Per Wille Glaser give schedule Novolog 10 units.  No new orders at this time

## 2016-02-12 NOTE — Progress Notes (Signed)
Physical Therapy Treatment Patient Details Name: JONAVON HORSMAN MRN: [id number] DOB: [birth year] Today's Date: 02/12/2016    History of Present Illness Ladaniel "[name]" Maggart is a 65yo white male who presents to [facility] for elective L TKA. Pt reports decline in ambulatory function and pain related to L knee including 2 falls, the first in October 2016 resultant in a L broken wrist, and a second in Jan 2017. 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     PT Comments    Pt demonstrating improved activity tolerance and ambulation distance, and good learning of gait, transfer, and bed mobility cues. Discussed seated HEP activities. Pt making good progress toward all goals. Will go over stairs in detail tomorrow, as well as full HEP with techniques requires to perform indep. Continue with POC as previously outlined in eval.   Follow Up Recommendations  Home health PT     Equipment Recommendations  Rolling walker with 5" wheels (wife is looking into getting an exchange from [employer] walk[employer]. )    Recommendations for Other Services       Precautions / Restrictions Precautions Precautions: Knee Precaution Booklet Issued: Yes (comment) Restrictions LLE Weight Bearing: Weight bearing as tolerated    Mobility  Bed Mobility Overal bed mobility: Modified Independent;Needs Assistance Bed Mobility: Sit to Supine       Sit to supine: Supervision   General bed mobility comments: trapeze and heavy verbal cues; taught leg hooking for indep.   Transfers Overall transfer level: Needs assistance Equipment used: Rolling walker (2 wheeled) Transfers: Sit to/from Stand Sit to Stand: Supervision         General transfer comment: additional time and effort, reluctance to use LLE.   Ambulation/Gait Ambulation/Gait assistance: Supervision Ambulation Distance (Feet): 100 Feet Assistive device: Rolling walker (2  wheeled)     Gait velocity interpretation: <1.8 ft/sec, indicative of risk for recurrent falls General Gait Details: heavy use of BUE, follows cues for TKE in stance and bent knee in swing.    Stairs            Wheelchair Mobility    Modified Rankin (Stroke Patients Only)       Balance Overall balance assessment: Modified Independent;No apparent balance deficits (not formally assessed)                                  Cognition Arousal/Alertness: Awake/alert Behavior During Therapy: WFL for tasks assessed/performed Overall Cognitive Status: Within Functional Limits for tasks assessed                      Exercises Total Joint Exercises Goniometric ROM: 10-75* L knee flexion.  Other Exercises Other Exercises: Supine Left sided SLR, SAQ, Hip Abduction, Heel slides, Quad sets x10, and bilat ankle pumps x20.  Other Exercises: Seated L eft knee flexion x10 (manually resisted) and LAQx10 minA for full available range.     General Comments        Pertinent Vitals/Pain Pain Assessment: 0-10 Pain Score: 3  Pain Location: Left knee Pain Descriptors / Indicators: Aching;Operative site guarding Pain Intervention(s): Limited activity within patient's tolerance;Monitored during session;Premedicated before session;Repositioned;Ice applied    Home Living Family/patient expects to be discharged to:: Private residence Living Arrangements: Spouse/significant other Available Help at Discharge: Family Type of Home: House Home Access: Stairs to enter Entrance  Stairs-Rails: Can reach both Home Layout: One level Home Equipment: Walker - 2 wheels;Cane - single point (his walker is new, but appears to be a pediatric height walker, altready at maximal height and mildly too short. )      Prior Function Level of Independence: Independent with assistive device(s)      Comments: SPC for longer distances.    PT Goals (current goals can now be found in the care  plan section) Acute Rehab PT Goals Patient Stated Goal: Improve strength and function.  PT Goal Formulation: With patient Time For Goal Achievement: 02/26/16 Potential to Achieve Goals: Good Progress towards PT goals: Progressing toward goals    Frequency  BID    PT Plan Current plan remains appropriate    Co-evaluation             End of Session Equipment Utilized During Treatment: Gait belt Activity Tolerance: Patient tolerated treatment well;Patient limited by fatigue Patient left: with call bell/phone within reach;with SCD's reapplied;in bed;with bed alarm set;with family/visitor present     Time: [id number] PT Time Calculation (min) (ACUTE ONLY): 28 min  Charges:  $Gait Training: 8-22 mins $Therapeutic Exercise: 8-22 mins                    G Codes:      2:10 PM, 10-Mar-2016 Etta Grandchild, PT, DPT PRN Physical Therapist - [place] License # [id number] [phone] (mobile)

## 2016-02-12 NOTE — Progress Notes (Signed)
Autovac infusion completed. Unable to determine the amt infused. Started on previous shift. Nurse stated 425 was in cannister prior to hanging.

## 2016-02-12 NOTE — Discharge Instructions (Signed)

## 2016-02-12 NOTE — Progress Notes (Signed)
Clinical Social Worker (CSW) received SNF consult. PT is recommending home health. RN Case Manager is aware of above. Please reconsult if future social work needs arise. CSW signing off.   Sayan Aldava Morgan, LCSW [phone] 

## 2016-02-12 NOTE — Progress Notes (Addendum)
Subjective: 1 Day Post-Op Procedure(s) (LRB): COMPUTER ASSISTED TOTAL KNEE ARTHROPLASTY (Left) Patient reports pain as mild.   Patient is well, and has had no acute complaints or problems We will start therapy today.  Plan is to go Home after hospital stay. no nausea and no vomiting Patient denies any chest pains or shortness of breath. Objective: Vital signs in last 24 hours: Temp:  [97.4 F (36.3 C)-98.6 F (37 C)] 98 F (36.7 C) (04/11 0427) Pulse Rate:  [67-85] 74 (04/11 0427) Resp:  [12-18] 18 (04/11 0427) BP: (112-157)/(43-66) 122/54 mmHg (04/11 0427) SpO2:  [94 %-100 %] 94 % (04/11 0427) Weight:  [135.399 kg (298 lb 8 oz)] 135.399 kg (298 lb 8 oz) (04/10 1008) Heels are non tender and elevated off the bed using rolled towels Intake/Output from previous day: 04/10 0701 - 04/11 0700 In: 3088.3 [I.V.:3088.3] Out: 2985 [Urine:2370; Drains:565; Blood:50] Intake/Output this shift:     Recent Labs  02/12/16 0508  HGB 11.5*    Recent Labs  02/12/16 0508  WBC 10.8*  RBC 3.57*  HCT 33.8*  PLT 153    Recent Labs  02/12/16 0508  NA 129*  K 4.2  CL 97*  CO2 22  BUN 20  CREATININE 1.04  GLUCOSE 447*  CALCIUM 7.7*   No results for input(s): LABPT, INR in the last 72 hours.  EXAM General - Patient is Alert, Appropriate and Oriented Extremity - Neurologically intact Neurovascular intact Sensation intact distally Intact pulses distally Dorsiflexion/Plantar flexion intact Dressing - dressing C/D/I Motor Function - intact, moving foot and toes well on exam. Having some difficulty doing straight-leg raise on his own. Did need some assistance. Working heels very well. At this current time evidence of any DVTs of the lower extremity.  Past Medical History  Diagnosis Date  . Muscle pain   . Sinus complaint   . Dizzy   . Glaucoma   . Osteoarthritis   . Swelling   . Diabetes ([place])   . Fatigue   . Night sweats   . Poor circulation   . HBP (high blood  pressure)   . Chest pain   . Skin cancer   . COPD, mild ([place])   . Bruises easily   . Elevated lipids   . Heart attack [facility])     Sept of 2008  . Neuropathy ([place])   . Sleep apnea     Assessment/Plan: 1 Day Post-Op Procedure(s) (LRB): COMPUTER ASSISTED TOTAL KNEE ARTHROPLASTY (Left) Active Problems:   S/P total knee arthroplasty  Estimated body mass index is 42.83 kg/(m^2) as calculated from the following:   Height as of this encounter: 5\' 10"  (1.778 m).   Weight as of this encounter: 135.399 kg (298 lb 8 oz). Advance diet Up with therapy D/C IV fluids Plan for discharge tomorrow Discharge home with home health  Labs: Were reviewed on today's visit. Blood sugar levels continues to be extremity. On sliding scale DVT Prophylaxis - Foot Pumps and TED hose. Unable to do anticoagulation medication due to previous medical issues. Patient however was instructed on necessity of doing heel pumps bilaterally and not just the surgical leg. Very important to continue with TED stockings on the right leg. We'll put the left frontal tomorrow after Hemovac is removed. Weight-Bearing as tolerated to left leg D/C O2 and Pulse OX and try on Room Air Begin working on a bowel movement   Medical consult has been placed to assist with his elevated glucose.   Jillyn Ledger.  [place] 02/12/2016, 7:36 AM   Addendum: Patient's endocrinologist has been working on his insulin management and discontinued his previous standing dose of novolog 20 tid with meals to a flexible regimen based on his carbohydrate intake. Discussed with the patient. Will restart a baseline of novolog 10 units tid with meals in addition to his sliding scale while he is in the hospital. Patient was in agreement.  Darnell Stimson P. Holley Bouche M.D.

## 2016-02-12 NOTE — Progress Notes (Signed)
Inpatient Diabetes Program Recommendations  AACE/ADA: New Consensus Statement on Inpatient Glycemic Control (2015)  Target Ranges:  Prepandial:   less than 140 mg/dL      Peak postprandial:   less than 180 mg/dL (1-2 hours)      Critically ill patients:  140 - 180 mg/dL  Results for Lawrence [name], Lawrence [name] (MRN [id number]) as of 02/12/2016 09:21  Ref. Range 02/11/2016 09:51 02/11/2016 11:03 02/11/2016 15:33 02/11/2016 21:57 02/12/2016 01:59 02/12/2016 02:01 02/12/2016 04:19 02/12/2016 04:20 02/12/2016 07:58  Glucose-Capillary Latest Ref Range: 65-99 mg/dL 303 (H) 266 (H) 163 (H) 369 (H) 453 (H) 427 (H) 452 (H) 432 (H) 403 (H)   Review of Glycemic Control  Diabetes history: DM2 Outpatient Diabetes medications: Lantus 20 units QHS, Novolog 20 units TID with meals Current orders for Inpatient glycemic control: Lantus 15 units QHS, Novolog 0-15 units TID with meals, Novolog 10 units TID with meals for meal coverage  Inpatient Diabetes Program Recommendations: Insulin - Basal: Please consider increasing Lantus to 20 units QHS (based on 135 kg x 0.15 units). Correction (SSI): Please consider ordering Novolog bedtime correction scale. HgbA1C: Please order an A1C (add on to blood in lab if possible) to evaluate glycemic control over the past 2-3 months.  Thanks, Barnie Alderman, RN, MSN, CDE Diabetes Coordinator Inpatient Diabetes Program [phone] (Team Pager from [place] to [place]) [phone] ([place] office) [phone] [facility] office) [phone] [facility] office)

## 2016-02-12 NOTE — Progress Notes (Signed)
PT Cancellation Note  Patient Details Name: Lawrence [name] MRN: [id number] DOB: 1950-12-02   Cancelled Treatment:    Reason Eval/Treat Not Completed: Patient not medically ready. Holding PT evaluation at this time due to continued elevated BG in excess of 400. Will continue to monitor and evaluation once medical appropriate at later date/time.    8:31 AM, 02/12/2016 Etta Grandchild, PT, DPT PRN Physical Therapist - [place] License # [id number] [phone] (mobile)

## 2016-02-12 NOTE — Progress Notes (Signed)
OT Cancellation Note  Patient Details Name: Lawrence [name] MRN: [id number] DOB: 02-Oct-1951   Cancelled Treatment:    Reason Eval/Treat Not Completed: Medical issues which prohibited therapy (Blood Gluclose levels elevated this A.M. will continue to monitor)  Harrel Carina, MS, OTR/L  Harrel Carina 02/12/2016, 11:56 AM

## 2016-02-12 NOTE — Care Management Note (Signed)
Case Management Note  Patient Details  Name: Lawrence [name] MRN: [id number] Date of Birth: 1951-05-05  Subjective/Objective:                  Met with patient and his wife to discuss discharge planning. He plans to return home and would like to use [facility] [place]. He has a rolling walker available at bedside. He will not require anticoagulant at discharge per PA note. He uses [facility] for Rx. PT pending.   Action/Plan: List of home health agencies left with patient. Referral made to [place]. RNCM will continue to follow.   Expected Discharge Date:                  Expected Discharge Plan:     In-House Referral:     Discharge planning Services  CM Consult  Post Acute Care Choice:  Home Health Choice offered to:  Patient, Spouse  DME Arranged:    DME Agency:     HH Arranged:  PT [place]:  [place]  Status of Service:  In process, will continue to follow  Medicare Important Message Given:    Date Medicare IM Given:    Medicare IM give by:    Date Additional Medicare IM Given:    Additional Medicare Important Message give by:     If discussed at [place] of Stay Meetings, dates discussed:    Additional Comments:  Marshell Garfinkel, RN 02/12/2016, 9:11 AM

## 2016-02-12 NOTE — Plan of Care (Signed)
Problem: Acute Rehab PT Goals(only PT should resolve) Goal: Patient Will Transfer Sit To/From Stand Pt will transfer sit to/from-stand with RW at [place] without loss-of-balance to demonstrate good safety awareness for independent mobility in home.     Goal: Pt Will Ambulate Pt will ambulate with RW at Supervision using a step-through pattern and equal step length for a distance greater than 235ft to demonstrate the ability to perform safe household distance ambulation at discharge.    Goal: Pt Will Go Up/Down Stairs Pt will ascend/descend >10 stairs with LRAD and 2 handrails at Supervision to demonstrate safe entry/exit of home.

## 2016-02-12 NOTE — Evaluation (Signed)
Physical Therapy Evaluation Patient Details Name: Lawrence [name] MRN: [id number] DOB: 1951/03/30 Today's Date: 02/12/2016   History of Present Illness  Lawrence [name] is a 65yo white male who presents to [facility] for elective L TKA. Pt reports decline in ambulatory function and pain related to L knee including 2 falls, the first in October 2016 resultant in a L broken wrist, and a second in Jan 2017. 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   Clinical Impression   Pt presenting with well controlled pain throughout session, good strength, able to perform almost entire HEP with supervision, and good tolerance to HEP training and initial gait trial. Pt demonstrating impairment of strength, balance, and activity tolerance, limiting indep in ADL and functional mobility within the home and community. Pt will benefit from skilled PT intervention to address the above deficits in order to restore patient to PLOF and improve safety for return to home.       Follow Up Recommendations Home health PT    Equipment Recommendations  Rolling walker with 5" wheels    Recommendations for Other Services       Precautions / Restrictions Precautions Precautions: Knee Precaution Booklet Issued: No Restrictions Weight Bearing Restrictions: Yes LLE Weight Bearing: Weight bearing as tolerated      Mobility  Bed Mobility Overal bed mobility: Modified Independent             General bed mobility comments: limited by pain, trunk weakness, but performs safely with maximal effort.   Transfers Overall transfer level: Needs assistance Equipment used: Rolling walker (2 wheeled) Transfers: Sit to/from Stand Sit to Stand: Supervision            Ambulation/Gait Ambulation/Gait assistance: Min guard Ambulation Distance (Feet): 5 Feet Assistive device: Rolling walker (2 wheeled)     Gait velocity  interpretation: <1.8 ft/sec, indicative of risk for recurrent falls General Gait Details: very limited volitional WB.   Stairs            Wheelchair Mobility    Modified Rankin (Stroke Patients Only)       Balance Overall balance assessment: No apparent balance deficits (not formally assessed);Modified Independent                                           Pertinent Vitals/Pain Pain Assessment: 0-10 Pain Score: 2  Pain Location: Left knee.  Pain Descriptors / Indicators: Operative site guarding Pain Intervention(s): Limited activity within patient's tolerance;Monitored during session;Premedicated before session;Repositioned;Ice applied    Home Living Family/patient expects to be discharged to:: Private residence Living Arrangements: Spouse/significant other Available Help at Discharge: Family Type of Home: House Home Access: Stairs to enter Entrance Stairs-Rails: Can reach both Entrance Stairs-Number of Steps: 7 Home Layout: One level Home Equipment: Environmental consultant - 2 wheels;Cane - single point (his walker is new, but appears to be a pediatric height walker, altready at maximal height and mildly too short. )      Prior Function Level of Independence: Independent with assistive device(s)         Comments: SPC for longer distances.      Hand Dominance        Extremity/Trunk Assessment               Lower Extremity Assessment: LLE deficits/detail  LLE Deficits / Details: typical postoperative swelling/pain motor inhibition.      Communication   Communication: No difficulties  Cognition Arousal/Alertness: Awake/alert Behavior During Therapy: WFL for tasks assessed/performed Overall Cognitive Status: Within Functional Limits for tasks assessed                      General Comments      Exercises Total Joint Exercises Goniometric ROM: 10-75* L knee flexion.  Other Exercises Other Exercises: Supine Left sided SLR, SAQ, Hip  Abduction, Heel slides, Quad sets x10, and bilat ankle pumps x20.       Assessment/Plan    PT Assessment Patient needs continued PT services  PT Diagnosis Difficulty walking;Abnormality of gait;Generalized weakness;Acute pain   PT Problem List Decreased strength;Decreased range of motion;Decreased activity tolerance;Decreased balance;Decreased mobility;Decreased coordination;Decreased knowledge of use of DME;Decreased knowledge of precautions;Obesity  PT Treatment Interventions DME instruction;Gait training;Stair training;Functional mobility training;Therapeutic activities;Therapeutic exercise;Balance training;Neuromuscular re-education   PT Goals (Current goals can be found in the Care Plan section) Acute Rehab PT Goals Patient Stated Goal: Improve strength and function.  PT Goal Formulation: With patient Time For Goal Achievement: 02/26/16 Potential to Achieve Goals: Good    Frequency BID   Barriers to discharge Inaccessible home environment 7 step entry    Co-evaluation               End of Session Equipment Utilized During Treatment: Gait belt Activity Tolerance: Patient tolerated treatment well Patient left: in chair;with call bell/phone within reach;with SCD's reapplied Nurse Communication: Other (comment)         [id number] PT Time Calculation (min) (ACUTE ONLY): 33 min   Charges:   PT Evaluation $PT Eval Moderate Complexity: 1 Procedure PT Treatments $Therapeutic Exercise: 8-22 mins   PT G Codes:        11:36 AM, 2016/03/04 Etta Grandchild, PT, DPT PRN Physical Therapist - [place] License # [id number] ([phone] (mobile)

## 2016-02-12 NOTE — Progress Notes (Addendum)
Consult called to Dr Marcille Blanco. Received order to administer 15 units novolog insulin according to sliding scale

## 2016-02-13 LAB — CBC
HCT: 30.9 % — ABNORMAL LOW (ref 40.0–52.0)
HEMOGLOBIN: 10.5 g/dL — AB (ref 13.0–18.0)
MCH: 32.2 pg (ref 26.0–34.0)
MCHC: 34.1 g/dL (ref 32.0–36.0)
MCV: 94.4 fL (ref 80.0–100.0)
PLATELETS: 202 10*3/uL (ref 150–440)
RBC: 3.28 MIL/uL — AB (ref 4.40–5.90)
RDW: 13 % (ref 11.5–14.5)
WBC: 9.6 10*3/uL (ref 3.8–10.6)

## 2016-02-13 LAB — GLUCOSE, CAPILLARY
GLUCOSE-CAPILLARY: 376 mg/dL — AB (ref 65–99)
Glucose-Capillary: 446 mg/dL — ABNORMAL HIGH (ref 65–99)

## 2016-02-13 LAB — BASIC METABOLIC PANEL
Anion gap: 7 (ref 5–15)
BUN: 22 mg/dL — AB (ref 6–20)
CHLORIDE: 97 mmol/L — AB (ref 101–111)
CO2: 27 mmol/L (ref 22–32)
CREATININE: 1.06 mg/dL (ref 0.61–1.24)
Calcium: 7.9 mg/dL — ABNORMAL LOW (ref 8.9–10.3)
Glucose, Bld: 374 mg/dL — ABNORMAL HIGH (ref 65–99)
POTASSIUM: 4.3 mmol/L (ref 3.5–5.1)
SODIUM: 131 mmol/L — AB (ref 135–145)

## 2016-02-13 LAB — HEMOGLOBIN A1C: Hgb A1c MFr Bld: 7.7 % — ABNORMAL HIGH (ref 4.0–6.0)

## 2016-02-13 MED ORDER — LACTULOSE 10 GM/15ML PO SOLN
10.0000 g | Freq: Two times a day (BID) | ORAL | Status: DC | PRN
  Administered 2016-02-13: 10 g via ORAL
  Filled 2016-02-13: qty 30

## 2016-02-13 NOTE — Progress Notes (Signed)
   Subjective: 2 Days Post-Op Procedure(s) (LRB): COMPUTER ASSISTED TOTAL KNEE ARTHROPLASTY (Left) Patient reports pain as moderate.   Patient is well, and has had no acute complaints or problems Continue with physical therapy today.  Plan is to go Home after hospital stay. no nausea and no vomiting Patient denies any chest pains or shortness of breath. Objective: Vital signs in last 24 hours: Temp:  [98.1 F (36.7 C)-98.7 F (37.1 C)] 98.1 F (36.7 C) (04/12 0430) Pulse Rate:  [70-82] 82 (04/12 0430) Resp:  [18-20] 18 (04/12 0430) BP: (122-140)/(43-51) 135/51 mmHg (04/12 0430) SpO2:  [94 %-100 %] 94 % (04/12 0430) well approximated incision Heels are non tender and elevated off the bed using rolled towels Intake/Output from previous day: 04/11 0701 - 04/12 0700 In: 880 [P.O.:480; IV Piggyback:400] Out: 2455 [Urine:2200; Drains:255] Intake/Output this shift:     Recent Labs  02/12/16 0508 02/13/16 0601  HGB 11.5* 10.5*    Recent Labs  02/12/16 0508 02/13/16 0601  WBC 10.8* 9.6  RBC 3.57* 3.28*  HCT 33.8* 30.9*  PLT 153 202    Recent Labs  02/12/16 0508 02/13/16 0601  NA 129* 131*  K 4.2 4.3  CL 97* 97*  CO2 22 27  BUN 20 22*  CREATININE 1.04 1.06  GLUCOSE 447* 374*  CALCIUM 7.7* 7.9*   No results for input(s): LABPT, INR in the last 72 hours.  EXAM General - Patient is Alert, Appropriate and Oriented Extremity - Neurologically intact Neurovascular intact Sensation intact distally Intact pulses distally Dorsiflexion/Plantar flexion intact Dressing - scant drainage Motor Function - intact, moving foot and toes well on exam.    Past Medical History  Diagnosis Date  . Muscle pain   . Sinus complaint   . Dizzy   . Glaucoma   . Osteoarthritis   . Diabetes ([place])   . Fatigue   . Night sweats   . Poor circulation   . HBP (high blood pressure)   . Skin cancer   . COPD, mild ([place])   . Bruises easily   . Elevated lipids   . Neuropathy ([place])    . Sleep apnea   . CAD (coronary artery disease)     07/2007    Assessment/Plan: 2 Days Post-Op Procedure(s) (LRB): COMPUTER ASSISTED TOTAL KNEE ARTHROPLASTY (Left) Active Problems:   S/P total knee arthroplasty  Estimated body mass index is 42.83 kg/(m^2) as calculated from the following:   Height as of this encounter: 5\' 10"  (1.778 m).   Weight as of this encounter: 135.399 kg (298 lb 8 oz). Up with therapy Discharge home with home health  Labs: Was reviewed and are improved from yesterday DVT Prophylaxis - Foot Pumps and TED hose Weight-Bearing as tolerated to left leg Patient needs a bowel movement today. Patient needs to do the lap around the nurse's desk and do steps prior to being discharged Hemovac was discontinued today. Change dressing prior to being discharged and give the patient 2 extra honeycomb dressings to take home.  Jillyn Ledger. [place] 02/13/2016, 7:05 AM

## 2016-02-13 NOTE — Discharge Summary (Signed)
Physician Discharge Summary  Patient ID: Lawrence [name] MRN: [id number] DOB/AGE: 1951-10-10 65 y.o.  Admit date: 02/11/2016 Discharge date: 02/13/2016  Admission Diagnoses:  PRIMARY OSTEOARTHRITIS   Discharge Diagnoses: Patient Active Problem List   Diagnosis Date Noted  . S/P total knee arthroplasty 02/11/2016  . Acute sinusitis 11/06/2015  . Hyperlipidemia 08/28/2015  . Type 2 diabetes mellitus ([place]) 08/28/2015  . CAD (coronary artery disease) 08/28/2015  . Lower extremity edema 08/28/2015  . GERD (gastroesophageal reflux disease) 08/28/2015  . OSA (obstructive sleep apnea) 08/28/2015  . Morbid obesity with BMI of 40.0-44.9, adult ([place]) 08/28/2015    Past Medical History  Diagnosis Date  . Muscle pain   . Sinus complaint   . Dizzy   . Glaucoma   . Osteoarthritis   . Diabetes ([place])   . Fatigue   . Night sweats   . Poor circulation   . HBP (high blood pressure)   . Skin cancer   . COPD, mild ([place])   . Bruises easily   . Elevated lipids   . Neuropathy ([place])   . Sleep apnea   . CAD (coronary artery disease)     07/2007     Transfusion: Autovac transfusions given first 6 hours postoperatively   Consultants (if any): Treatment Team:  Hillary Bow, MD; case management for home health assistance  Discharged Condition: Improved  Hospital Course: Lawrence [name] is an 65 y.o. male who was admitted 02/11/2016 with a diagnosis of degenerative arthrosis left knee and went to the operating room on 02/11/2016 and underwent the above named procedures.    Surgeries:Procedure(s): COMPUTER ASSISTED TOTAL KNEE ARTHROPLASTY on 02/11/2016  PRE-OPERATIVE DIAGNOSIS: Degenerative arthrosis of the left knee, primary  POST-OPERATIVE DIAGNOSIS: Same  PROCEDURE: Left total knee arthroplasty using computer-assisted navigation  SURGEON: Marciano Sequin. M.D.  ASSISTANT: Vance Peper, PA (present and scrubbed throughout the case, critical for assistance with exposure,  retraction, instrumentation, and closure)  ANESTHESIA: spinal  ESTIMATED BLOOD LOSS: 50 mL  FLUIDS REPLACED: 1600 mL of crystalloid  TOURNIQUET TIME: 111 minutes  DRAINS: 2 medium drains to a reinfusion system  SOFT TISSUE RELEASES: Anterior cruciate ligament, posterior cruciate ligament, deep and superficial medial collateral ligament, patellofemoral ligament   IMPLANTS UTILIZED: DePuy Attune size 8 posterior stabilized femoral component (cemented), size 8 rotating platform tibial component (cemented), 41 mm medialized dome patella (cemented), and a 5 mm stabilized rotating platform polyethylene insert.  INDICATIONS FOR SURGERY: Lawrence [name] is a 65 y.o. year old male with a long history of progressive knee pain. X-rays demonstrated severe degenerative changes in tricompartmental fashion. The patient had not seen any significant improvement despite conservative nonsurgical intervention. After discussion of the risks and benefits of surgical intervention, the patient expressed understanding of the risks benefits and agree with plans for total knee arthroplasty.   The risks, benefits, and alternatives were discussed at length including but not limited to the risks of infection, bleeding, nerve injury, stiffness, blood clots, the need for revision surgery, cardiopulmonary complications, among others, and they were willing to proceed. Patient tolerated the surgery well. No complications .Patient was taken to PACU where she was stabilized and then transferred to the orthopedic floor.  No anticoagulation medication was given secondary to medical issues. Foot pumps applied bilaterally at 80 mm hgb. Heels elevated off bed with rolled towels. No evidence of DVT. Calves non tender. Negative [place]oman. Physical therapy started on day #1 for gait training and transfer with OT starting on  day #  1 for ADL and assisted devices. Patient has done well with therapy. Ambulated greater than 200 feet upon being  discharged. Was able to go up and down 4 steps safely and independently.  Patient's IV and Foley were discontinued on day #1 with the Hemovac being just continued on day #2. Dressing also changed on day #2.   He was given perioperative antibiotics:  Anti-infectives    Start     Dose/Rate Route Frequency Ordered Stop   02/11/16 1730  ceFAZolin (ANCEF) IVPB 2g/100 mL premix     2 g 200 mL/hr over 30 Minutes Intravenous Every 6 hours 02/11/16 1626 02/12/16 1735   02/11/16 1030  ceFAZolin (ANCEF) IVPB 2g/100 mL premix  Status:  Discontinued    Comments:  In OR   2 g 200 mL/hr over 30 Minutes Intravenous  Once 02/11/16 1016 02/11/16 1017   02/11/16 1016  ceFAZolin (ANCEF) IVPB 2g/100 mL premix     2 g 200 mL/hr over 30 Minutes Intravenous On call to O.R. 02/11/16 1016 02/11/16 1130   02/11/16 0942  ceFAZolin (ANCEF) 2-4 GM/100ML-% IVPB    Comments:  Ronnell Freshwater: cabinet override      02/11/16 0942 02/11/16 2144    .  He was fitted with AV 1 compression foot pump, instructed on heel pumps devices, early ambulation, and TED stockings bilaterally  for DVT prophylaxis.  He benefited maximally from the hospital stay and there were no complications.    Recent vital signs:  Filed Vitals:   02/12/16 1947 02/13/16 0430  BP: 122/48 135/51  Pulse:  82  Temp:  98.1 F (36.7 C)  Resp:  18    Recent laboratory studies:  Lab Results  Component Value Date   HGB 10.5* 02/13/2016   HGB 11.5* 02/12/2016   HGB 13.4 01/30/2016   Lab Results  Component Value Date   WBC 9.6 02/13/2016   PLT 202 02/13/2016   Lab Results  Component Value Date   INR 0.98 01/30/2016   Lab Results  Component Value Date   NA 131* 02/13/2016   K 4.3 02/13/2016   CL 97* 02/13/2016   CO2 27 02/13/2016   BUN 22* 02/13/2016   CREATININE 1.06 02/13/2016   GLUCOSE 374* 02/13/2016    Discharge Medications:     Medication List    TAKE these medications        acetaminophen 500 MG tablet  Commonly  known as:  TYLENOL  Take 500 mg by mouth every 6 (six) hours as needed.     cetirizine 10 MG tablet  Commonly known as:  ZYRTEC  Take 10 mg by mouth daily.     cholecalciferol 1000 units tablet  Commonly known as:  VITAMIN D  Take 1,000 Units by mouth daily.     CHOLESTOFF PO  Take 2 capsules by mouth 2 (two) times daily.     docusate sodium 100 MG capsule  Commonly known as:  COLACE  Take 200 mg by mouth 2 (two) times daily.     furosemide 20 MG tablet  Commonly known as:  LASIX  Take 20 mg by mouth 2 (two) times daily.     GLUCAGON EMERGENCY 1 MG injection  Generic drug:  glucagon  Inject 1 mg into the vein once as needed. Reported on 02/11/2016     LANTUS 100 UNIT/ML injection  Generic drug:  insulin glargine  Inject 20 Units into the skin at bedtime.     latanoprost 0.005 % ophthalmic solution  Commonly known as:  XALATAN  Place 1 drop into both eyes at bedtime.     meloxicam 15 MG tablet  Commonly known as:  MOBIC  Take 1 tablet by mouth daily.     METANX 3-90.314-2-35 MG Caps  TAKE ONE (1) CAPSULE BY MOUTH 2 TIMES DAILY     metoprolol tartrate 25 MG tablet  Commonly known as:  LOPRESSOR  Take 25 mg by mouth 2 (two) times daily.     multivitamin capsule  Take 1 capsule by mouth daily.     NOVOLOG 100 UNIT/ML injection  Generic drug:  insulin aspart  Inject 20 Units into the skin 3 (three) times daily.     omeprazole 20 MG capsule  Commonly known as:  PRILOSEC  Take 20 mg by mouth daily.     oxyCODONE 5 MG immediate release tablet  Commonly known as:  Oxy IR/ROXICODONE  Take 1-2 tablets (5-10 mg total) by mouth every 4 (four) hours as needed for severe pain or breakthrough pain.     RANEXA 1000 MG SR tablet  Generic drug:  ranolazine  TAKE 1 TABLET TWICE A DAY     traMADol 50 MG tablet  Commonly known as:  ULTRAM  Take 1-2 tablets (50-100 mg total) by mouth every 4 (four) hours as needed for moderate pain.     vitamin C 500 MG tablet  Commonly  known as:  ASCORBIC ACID  Take 500 mg by mouth 2 (two) times daily.        Diagnostic Studies: Dg Knee Left Port  02/11/2016  CLINICAL DATA:  Postop left knee replacement EXAM: PORTABLE LEFT KNEE - 1-2 VIEW COMPARISON:  None. FINDINGS: Changes of left knee replacement. No hardware or bony complicating feature. Soft tissue drains in place. IMPRESSION: Left knee replacement.  No complicating feature. Electronically Signed   By: Rolm Baptise M.D.   On: 02/11/2016 15:28    Disposition: 01-Home or Self Care      Discharge Instructions    Diet - low sodium heart healthy    Complete by:  As directed      Diet - low sodium heart healthy    Complete by:  As directed      Increase activity slowly    Complete by:  As directed      Increase activity slowly    Complete by:  As directed            Follow-up Information    Follow up with [facility] R., PA On 02/26/2016.   Specialty:  Physician Assistant   Why:  At 1:15 PM   Contact information:   [address] [facility] [place] [address] [phone]       Follow up with Dereck Leep, MD On 03/25/2016.   Specialty:  Orthopedic Surgery   Why:  At 11 AM       Signed: WOLFE,JON R. 02/13/2016, 7:19 AM

## 2016-02-13 NOTE — Progress Notes (Signed)
[facility] [place] NAME: Lawrence [name]    MR#:  [id number]  DATE OF BIRTH:  02/27/1951  SUBJECTIVE:  CHIEF COMPLAINT:  No chief complaint on file.  No complaint. REVIEW OF SYSTEMS:  CONSTITUTIONAL: No fever, fatigue or weakness.  EYES: No blurred or double vision.  EARS, NOSE, AND THROAT: No tinnitus or ear pain.  RESPIRATORY: No cough, shortness of breath, wheezing or hemoptysis.  CARDIOVASCULAR: No chest pain, orthopnea, edema.  GASTROINTESTINAL: No nausea, vomiting, diarrhea or abdominal pain.  GENITOURINARY: No dysuria, hematuria.  ENDOCRINE: No polyuria, nocturia,  HEMATOLOGY: No anemia, easy bruising or bleeding SKIN: No rash or lesion. MUSCULOSKELETAL: No joint pain or arthritis.   NEUROLOGIC: No tingling, numbness, weakness.  PSYCHIATRY: No anxiety or depression.   DRUG ALLERGIES:   Allergies  Allergen Reactions  . Imdur [Isosorbide Nitrate] Nausea And Vomiting, Other (See Comments) and Nausea Only  . Statins Other (See Comments)    BODY PAIN BODY PAIN  . Lipitor [Atorvastatin] Other (See Comments) and Rash    BODY PAIN    VITALS:  Blood pressure 146/52, pulse 81, temperature 99.6 F (37.6 C), temperature source Oral, resp. rate 18, height 5\' 10"  (1.778 m), weight 135.399 kg (298 lb 8 oz), SpO2 96 %.  PHYSICAL EXAMINATION:  GENERAL:  65 y.o.-year-old patient lying in the bed with no acute distress. Morbid obese. EYES: Pupils equal, round, reactive to light and accommodation. No scleral icterus. Extraocular muscles intact.  HEENT: Head atraumatic, normocephalic. Oropharynx and nasopharynx clear.  NECK:  Supple, no jugular venous distention. No thyroid enlargement, no tenderness.  LUNGS: Normal breath sounds bilaterally, no wheezing, rales,rhonchi or crepitation. No use of accessory muscles of respiration.  CARDIOVASCULAR: S1, S2 normal. No murmurs, rubs, or gallops.  ABDOMEN: Soft, nontender, nondistended.  Bowel sounds present. No organomegaly or mass.  EXTREMITIES: No pedal edema, cyanosis, or clubbing. Dressing over left knee with drain.  NEUROLOGIC: Cranial nerves II through XII are intact. Muscle strength 5/5 in all extremities. Sensation intact. Gait not checked.  PSYCHIATRIC: The patient is alert and oriented x 3.  SKIN: No obvious rash, lesion, or ulcer.    LABORATORY PANEL:   CBC  Recent Labs Lab 02/13/16 0601  WBC 9.6  HGB 10.5*  HCT 30.9*  PLT 202   ------------------------------------------------------------------------------------------------------------------  Chemistries   Recent Labs Lab 02/13/16 0601  NA 131*  K 4.3  CL 97*  CO2 27  GLUCOSE 374*  BUN 22*  CREATININE 1.06  CALCIUM 7.9*   ------------------------------------------------------------------------------------------------------------------  Cardiac Enzymes No results for input(s): TROPONINI in the last 168 hours. ------------------------------------------------------------------------------------------------------------------  RADIOLOGY:  Dg Knee Left Port  02/11/2016  CLINICAL DATA:  Postop left knee replacement EXAM: PORTABLE LEFT KNEE - 1-2 VIEW COMPARISON:  None. FINDINGS: Changes of left knee replacement. No hardware or bony complicating feature. Soft tissue drains in place. IMPRESSION: Left knee replacement.  No complicating feature. Electronically Signed   By: Rolm Baptise M.D.   On: 02/11/2016 15:28    EKG:   Orders placed or performed in visit on 10/11/13  . EKG 12-Lead    ASSESSMENT AND PLAN:   * Controlled insulin-dependent diabetes mellitus IncreaseD Lantus to 22 units daily at night. Pre-meal insulin 10 units NovoLog. On Accu-Cheks before meals and at bedtime.  blood sugars is not well controlled. Per Pt, he has BS up and down at home. He has DM doctor to follow.  * CAD Continue metoprolol perioperatively.  * Pseudohyponatremia  due to elevated blood sugars  * Total  knee arthroplasty, Left  * DVT prophylaxis as per orthopedics  Sign off.  All the records are reviewed and case discussed with Care Management/Social Workerr. Management plans discussed with the patient, his wife and they are in agreement.  CODE STATUS: full code.  TOTAL TIME TAKING CARE OF THIS PATIENT: 25 minutes.  Greater than 50% time was spent on coordination of care and face-to-face counseling.  POSSIBLE D/C TODAY, DEPENDING ON CLINICAL CONDITION.   Demetrios Loll M.D on 02/13/2016 at 8:38 AM  Between 7am to 6pm - Pager - [phone]  After 6pm go to [email or web address] - password [email or web address] [facility]  [facility]  Office  [phone]  CC: Primary care physician; Sheral Flow, NP

## 2016-02-13 NOTE — Care Management Important Message (Signed)
Important Message  Patient Details  Name: Lawrence [name] MRN: [id number] Date of Birth: Mar 23, 1951   Medicare Important Message Given:  Yes    Juliann Pulse A Alonza Knisley 02/13/2016, 12:31 PM

## 2016-02-13 NOTE — Evaluation (Signed)
Occupational Therapy Evaluation Patient Details Name: SHAQWAN HOAK MRN: [id number] DOB: [birth year] Today's Date: 02/13/2016    History of Present Illness Telley "[name]" Moffit is a 65yo white male who presents to [facility] for elective L TKA. Pt reports decline in ambulatory function and pain related to L knee including 2 falls, the first in October 2016 resultant in a L broken wrist, and a second in Jan 2017. 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    Clinical Impression  Pt is 65 year old Male s/p Left TKR.  Pt was independent in all ADLs prior to surgery and is eager to return to his PLOF.  Pt currently requires min assist for LB ADLs with A/E use while in seated position due to pain and limited AROM of Left knee.  Pt would benefit from continued instruction in dressing techniques with or without assistive devices for LE ADLs. Pt. would also benefit from recommendations for home modifications to increase safety in the bathroom and prevent falls.     Follow Up Recommendations  Home health OT    Equipment Recommendations       Recommendations for Other Services PT consult     Precautions / Restrictions Precautions Precautions: Knee Precaution Booklet Issued: Yes (comment) Restrictions Weight Bearing Restrictions: Yes LLE Weight Bearing: Weight bearing as tolerated      Mobility Bed Mobility Overal bed mobility: Modified Independent;Needs Assistance Bed Mobility: Supine to Sit     Supine to sit: Min guard     General bed mobility comments: Heavy use of bed rails with HOB elevated. Extended time required to perform. Pt requires education regarding weight shifting to scoot toward EOB  Transfers Overall transfer level: Needs assistance Equipment used: Rolling walker (2 wheeled) Transfers: Sit to/from Stand Sit to Stand: Min guard             Balance Overall balance assessment:  Needs assistance Sitting-balance support: No upper extremity supported Sitting balance-Leahy Scale: Good                                  ADL   Eating/Feeding: Independent   Grooming: Independent           Upper Body Dressing : Independent   Lower Body Dressing: Minimal assistance                       Vision     Perception     Praxis      Pertinent Vitals/Pain Pain Assessment: 0-10 Pain Score: 0-No pain Pain Location: Left knee, denies pain at rest but reports increase in pain with movement Pain Descriptors / Indicators: Aching Pain Intervention(s): Limited activity within patient's tolerance;Monitored during session;Other (comment) (Pt unsure if he received pain meds, no AMpain meds in record)     Hand Dominance     Extremity/Trunk Assessment Upper Extremity Assessment Upper Extremity Assessment: Overall WFL for tasks assessed (Has a history of Left CTS)           Communication Communication Communication: No difficulties   Cognition Arousal/Alertness: Awake/alert Behavior During Therapy: WFL for tasks assessed/performed Overall Cognitive Status: Within Functional Limits for tasks assessed                     General Comments       Exercises Exercises:  Total Joint     Shoulder Instructions      Home Living Family/patient expects to be discharged to:: Private residence Living Arrangements: Spouse/significant other Available Help at Discharge: Family   Home Access: Stairs to enter Technical brewer of Steps: 7 Entrance Stairs-Rails: Can reach both [place]: One [place]: [place] - 2 wheels;Cane - single point          Prior Functioning/Environment Level of Independence: Independent with assistive device(s)             OT Diagnosis: Generalized weakness   OT Problem List: Decreased strength;Decreased range of motion;Obesity;Decreased knowledge of use of DME or  AE;Decreased activity tolerance   OT Treatment/Interventions: Therapeutic exercise;Self-care/ADL training;DME and/or AE instruction;Therapeutic activities;Patient/family education    OT Goals(Current goals can be found in the care plan section) Acute Rehab OT Goals Patient Stated Goal: To regain independence OT Goal Formulation: With patient/family Potential to Achieve Goals: Good  OT Frequency: Min 2X/week   Barriers to D/C:            Co-evaluation              End of Session Equipment Utilized During Treatment: Gait belt  Activity Tolerance: Patient tolerated treatment well Patient left: in chair;with family/visitor present   Time: [id number] OT Time Calculation (min): 38 min Charges:  OT General Charges $OT Visit: 1 Procedure OT Evaluation $OT Eval Moderate Complexity: 1 Procedure OT Treatments $Self Care/Home Management : 8-22 mins G-Codes:    Harrel Carina, MS, OTR/L Harrel Carina 02/13/2016, 12:27 PM

## 2016-02-13 NOTE — Care Management (Signed)
Bariatric walker has been delivered by [facility] with [place].

## 2016-02-13 NOTE — Progress Notes (Signed)
Patient discharged home. DC instructions provided and explained. Medications reviewed. Rx given. All questions answered. Pt stable at discharge. 

## 2016-02-13 NOTE — Progress Notes (Signed)
Physical Therapy Treatment Patient Details Name: Lawrence [name] MRN: [id number] DOB: 02-Mar-1951 Today's Date: 02/13/2016    History of Present Illness Lawrence [name] is a 65yo white male who presents to [facility] for elective L TKA. Pt reports decline in ambulatory function and pain related to L knee including 2 falls, the first in October 2016 resultant in a L broken wrist, and a second in Jan 2017. 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     PT Comments    Pt demonstrates excellent progress with physical therapy on this date. He ambulates very slow taking extended time to make a lap around RN station. He is also able to complete stair training safely and confidently on this date. Pt educated regarding car transfers. Performed toilet transfer with patient who has difficulty rising up from low commode. Patient and wife educated about adaptive strategies as well as use of BSC as needed. They defer need for [facility] prior to discharge and will purchase as needed. Pt has met all PT barriers to discharge at this time. Will be appropriate to return home with wife and [facility] PT when medically cleared. Pt will benefit from skilled PT services to address deficits in strength, balance, and mobility in order to return to full function at home.    Follow Up Recommendations  Home health PT     Equipment Recommendations  Rolling walker with 5" wheels;Other (comment) (bariatric rolling walker)    Recommendations for Other Services       Precautions / Restrictions Precautions Precautions: Knee Precaution Booklet Issued: Yes (comment) Restrictions Weight Bearing Restrictions: Yes LLE Weight Bearing: Weight bearing as tolerated    Mobility  Bed Mobility Overal bed mobility: Modified Independent;Needs Assistance Bed Mobility: Supine to Sit     Supine to sit: Min guard     General bed mobility comments: Heavy use  of bed rails with HOB elevated. Extended time required to perform. Pt requires education regarding weight shifting to scoot toward EOB  Transfers Overall transfer level: Needs assistance Equipment used: Rolling walker (2 wheeled) Transfers: Sit to/from Stand Sit to Stand: Min guard         General transfer comment: Pt requires cues for proper hand placement. Additional time and effort required. Decreased weight shift to LLE.Cues provided to encourage use of LLE  Ambulation/Gait Ambulation/Gait assistance: Min guard Ambulation Distance (Feet): 250 Feet Assistive device: Rolling walker (2 wheeled) Gait Pattern/deviations: Step-to pattern Gait velocity: Decreased Gait velocity interpretation: <1.8 ft/sec, indicative of risk for recurrent falls General Gait Details: Pt ambulates very slowly initially with minimal weight shift to LLE, absent heel strike, and heavy UE use. With cues from therapist pt able to improve speed, R step length, and weight acceptance to LLE. Pt with increased pain following ambulation and RN notified that pt requesting pain medication. vitals montored and remain WNL throughout ambulation. Pt provided seated rest break half way through distance   Stairs Stairs: Yes Stairs assistance: Min guard Stair Management: Two rails;Step to pattern Number of Stairs: 4 General stair comments: Pt able to ascend/descend stairs confidently and proficiently with cues and education from therapist  Wheelchair Mobility    Modified Rankin (Stroke Patients Only)       Balance Overall balance assessment: Needs assistance Sitting-balance support: No upper extremity supported Sitting balance-Leahy Scale: Good     Standing balance support: Bilateral upper extremity supported Standing balance-Leahy Scale: [place] Standing  balance comment: Minimal weight shift to LLE in standing                    Cognition Arousal/Alertness: Awake/alert Behavior During Therapy: WFL for  tasks assessed/performed Overall Cognitive Status: Within Functional Limits for tasks assessed                      Exercises Total Joint Exercises Heel Slides: Strengthening;Both;10 reps;Seated Hip ABduction/ADduction: Strengthening;Both;10 reps;Seated Long Arc Quad: Strengthening;Both;10 reps;Seated Knee Flexion: Strengthening;Both;10 reps;Seated Goniometric ROM: -6 to 85 degrees AAROM, pain limited    General Comments        Pertinent Vitals/Pain Pain Assessment: 0-10 Pain Score: 0-No pain Pain Location: Left knee, denies pain at rest but reports increase in pain with movement Pain Descriptors / Indicators: Aching Pain Intervention(s): Limited activity within patient's tolerance;Monitored during session;Other (comment) (Pt unsure if he received pain meds, no AMpain meds in record)    Home Living                      Prior Function            PT Goals (current goals can now be found in the care plan section) Acute Rehab PT Goals Patient Stated Goal: Improve strength and function.  PT Goal Formulation: With patient Time For Goal Achievement: 02/26/16 Potential to Achieve Goals: Good Progress towards PT goals: Progressing toward goals    Frequency  BID    PT Plan Current plan remains appropriate    Co-evaluation             End of Session Equipment Utilized During Treatment: Gait belt Activity Tolerance: Patient tolerated treatment well Patient left: with call bell/phone within reach;with SCD's reapplied;with family/visitor present;in chair;Other (comment) (Polar care in place, towel roll under heel, chair pad)     Time: 5701-7793 PT Time Calculation (min) (ACUTE ONLY): 55 min  Charges:  $Gait Training: 8-22 mins $Therapeutic Exercise: 8-22 mins $Therapeutic Activity: 8-22 mins                    G Codes:      Lyndel Safe Huprich PT, DPT   Huprich,Jason 02/13/2016, 11:13 AM

## 2016-02-13 NOTE — Care Management (Signed)
I have notified Lawrence [name] with [facility] of patient's discharge. He is requesting a bariatric walker as he states his will not work (too small). I have requested walker to be delivered to this room prior to discharge today. No further RNCM needs. Case closed.

## 2016-02-14 DIAGNOSIS — J449 Chronic obstructive pulmonary disease, unspecified: Secondary | ICD-10-CM | POA: Diagnosis not present

## 2016-02-14 DIAGNOSIS — Z6841 Body Mass Index (BMI) 40.0 and over, adult: Secondary | ICD-10-CM | POA: Diagnosis not present

## 2016-02-14 DIAGNOSIS — I2510 Atherosclerotic heart disease of native coronary artery without angina pectoris: Secondary | ICD-10-CM | POA: Diagnosis not present

## 2016-02-14 DIAGNOSIS — Z471 Aftercare following joint replacement surgery: Secondary | ICD-10-CM | POA: Diagnosis not present

## 2016-02-14 DIAGNOSIS — Z96652 Presence of left artificial knee joint: Secondary | ICD-10-CM | POA: Diagnosis not present

## 2016-02-14 DIAGNOSIS — Z794 Long term (current) use of insulin: Secondary | ICD-10-CM | POA: Diagnosis not present

## 2016-02-14 DIAGNOSIS — K219 Gastro-esophageal reflux disease without esophagitis: Secondary | ICD-10-CM | POA: Diagnosis not present

## 2016-02-14 DIAGNOSIS — E1140 Type 2 diabetes mellitus with diabetic neuropathy, unspecified: Secondary | ICD-10-CM | POA: Diagnosis not present

## 2016-02-14 DIAGNOSIS — E785 Hyperlipidemia, unspecified: Secondary | ICD-10-CM | POA: Diagnosis not present

## 2016-02-14 DIAGNOSIS — Z85828 Personal history of other malignant neoplasm of skin: Secondary | ICD-10-CM | POA: Diagnosis not present

## 2016-02-14 DIAGNOSIS — G4733 Obstructive sleep apnea (adult) (pediatric): Secondary | ICD-10-CM | POA: Diagnosis not present

## 2016-02-16 DIAGNOSIS — E1140 Type 2 diabetes mellitus with diabetic neuropathy, unspecified: Secondary | ICD-10-CM | POA: Diagnosis not present

## 2016-02-16 DIAGNOSIS — Z96652 Presence of left artificial knee joint: Secondary | ICD-10-CM | POA: Diagnosis not present

## 2016-02-16 DIAGNOSIS — I2510 Atherosclerotic heart disease of native coronary artery without angina pectoris: Secondary | ICD-10-CM | POA: Diagnosis not present

## 2016-02-16 DIAGNOSIS — Z471 Aftercare following joint replacement surgery: Secondary | ICD-10-CM | POA: Diagnosis not present

## 2016-02-16 DIAGNOSIS — G4733 Obstructive sleep apnea (adult) (pediatric): Secondary | ICD-10-CM | POA: Diagnosis not present

## 2016-02-18 DIAGNOSIS — Z471 Aftercare following joint replacement surgery: Secondary | ICD-10-CM | POA: Diagnosis not present

## 2016-02-18 DIAGNOSIS — E1140 Type 2 diabetes mellitus with diabetic neuropathy, unspecified: Secondary | ICD-10-CM | POA: Diagnosis not present

## 2016-02-18 DIAGNOSIS — Z96652 Presence of left artificial knee joint: Secondary | ICD-10-CM | POA: Diagnosis not present

## 2016-02-18 DIAGNOSIS — I2510 Atherosclerotic heart disease of native coronary artery without angina pectoris: Secondary | ICD-10-CM | POA: Diagnosis not present

## 2016-02-18 DIAGNOSIS — G4733 Obstructive sleep apnea (adult) (pediatric): Secondary | ICD-10-CM | POA: Diagnosis not present

## 2016-02-19 DIAGNOSIS — Z96652 Presence of left artificial knee joint: Secondary | ICD-10-CM | POA: Diagnosis not present

## 2016-02-19 DIAGNOSIS — Z471 Aftercare following joint replacement surgery: Secondary | ICD-10-CM | POA: Diagnosis not present

## 2016-02-19 DIAGNOSIS — G4733 Obstructive sleep apnea (adult) (pediatric): Secondary | ICD-10-CM | POA: Diagnosis not present

## 2016-02-19 DIAGNOSIS — E1140 Type 2 diabetes mellitus with diabetic neuropathy, unspecified: Secondary | ICD-10-CM | POA: Diagnosis not present

## 2016-02-19 DIAGNOSIS — I2510 Atherosclerotic heart disease of native coronary artery without angina pectoris: Secondary | ICD-10-CM | POA: Diagnosis not present

## 2016-02-20 DIAGNOSIS — E1140 Type 2 diabetes mellitus with diabetic neuropathy, unspecified: Secondary | ICD-10-CM | POA: Diagnosis not present

## 2016-02-20 DIAGNOSIS — Z471 Aftercare following joint replacement surgery: Secondary | ICD-10-CM | POA: Diagnosis not present

## 2016-02-20 DIAGNOSIS — I2510 Atherosclerotic heart disease of native coronary artery without angina pectoris: Secondary | ICD-10-CM | POA: Diagnosis not present

## 2016-02-20 DIAGNOSIS — Z96652 Presence of left artificial knee joint: Secondary | ICD-10-CM | POA: Diagnosis not present

## 2016-02-20 DIAGNOSIS — G4733 Obstructive sleep apnea (adult) (pediatric): Secondary | ICD-10-CM | POA: Diagnosis not present

## 2016-02-21 DIAGNOSIS — Z471 Aftercare following joint replacement surgery: Secondary | ICD-10-CM | POA: Diagnosis not present

## 2016-02-21 DIAGNOSIS — G4733 Obstructive sleep apnea (adult) (pediatric): Secondary | ICD-10-CM | POA: Diagnosis not present

## 2016-02-21 DIAGNOSIS — E1140 Type 2 diabetes mellitus with diabetic neuropathy, unspecified: Secondary | ICD-10-CM | POA: Diagnosis not present

## 2016-02-21 DIAGNOSIS — Z96652 Presence of left artificial knee joint: Secondary | ICD-10-CM | POA: Diagnosis not present

## 2016-02-21 DIAGNOSIS — I2510 Atherosclerotic heart disease of native coronary artery without angina pectoris: Secondary | ICD-10-CM | POA: Diagnosis not present

## 2016-02-22 ENCOUNTER — Ambulatory Visit: Payer: Medicare Other | Admitting: Sports Medicine

## 2016-02-22 DIAGNOSIS — Z471 Aftercare following joint replacement surgery: Secondary | ICD-10-CM | POA: Diagnosis not present

## 2016-02-22 DIAGNOSIS — I2510 Atherosclerotic heart disease of native coronary artery without angina pectoris: Secondary | ICD-10-CM | POA: Diagnosis not present

## 2016-02-22 DIAGNOSIS — Z96652 Presence of left artificial knee joint: Secondary | ICD-10-CM | POA: Diagnosis not present

## 2016-02-22 DIAGNOSIS — E1140 Type 2 diabetes mellitus with diabetic neuropathy, unspecified: Secondary | ICD-10-CM | POA: Diagnosis not present

## 2016-02-22 DIAGNOSIS — G4733 Obstructive sleep apnea (adult) (pediatric): Secondary | ICD-10-CM | POA: Diagnosis not present

## 2016-02-25 DIAGNOSIS — E1140 Type 2 diabetes mellitus with diabetic neuropathy, unspecified: Secondary | ICD-10-CM | POA: Diagnosis not present

## 2016-02-25 DIAGNOSIS — G4733 Obstructive sleep apnea (adult) (pediatric): Secondary | ICD-10-CM | POA: Diagnosis not present

## 2016-02-25 DIAGNOSIS — I2510 Atherosclerotic heart disease of native coronary artery without angina pectoris: Secondary | ICD-10-CM | POA: Diagnosis not present

## 2016-02-25 DIAGNOSIS — Z96652 Presence of left artificial knee joint: Secondary | ICD-10-CM | POA: Diagnosis not present

## 2016-02-25 DIAGNOSIS — Z471 Aftercare following joint replacement surgery: Secondary | ICD-10-CM | POA: Diagnosis not present

## 2016-02-26 DIAGNOSIS — M25562 Pain in left knee: Secondary | ICD-10-CM | POA: Diagnosis not present

## 2016-02-27 DIAGNOSIS — M25562 Pain in left knee: Secondary | ICD-10-CM | POA: Diagnosis not present

## 2016-02-29 DIAGNOSIS — M25562 Pain in left knee: Secondary | ICD-10-CM | POA: Diagnosis not present

## 2016-03-03 DIAGNOSIS — M25562 Pain in left knee: Secondary | ICD-10-CM | POA: Diagnosis not present

## 2016-03-05 DIAGNOSIS — M25562 Pain in left knee: Secondary | ICD-10-CM | POA: Diagnosis not present

## 2016-03-07 DIAGNOSIS — M25562 Pain in left knee: Secondary | ICD-10-CM | POA: Diagnosis not present

## 2016-03-10 DIAGNOSIS — M25562 Pain in left knee: Secondary | ICD-10-CM | POA: Diagnosis not present

## 2016-03-12 DIAGNOSIS — M25562 Pain in left knee: Secondary | ICD-10-CM | POA: Diagnosis not present

## 2016-03-14 DIAGNOSIS — M25562 Pain in left knee: Secondary | ICD-10-CM | POA: Diagnosis not present

## 2016-03-17 DIAGNOSIS — M25562 Pain in left knee: Secondary | ICD-10-CM | POA: Diagnosis not present

## 2016-03-19 DIAGNOSIS — M25562 Pain in left knee: Secondary | ICD-10-CM | POA: Diagnosis not present

## 2016-03-21 DIAGNOSIS — M25562 Pain in left knee: Secondary | ICD-10-CM | POA: Diagnosis not present

## 2016-03-25 ENCOUNTER — Telehealth: Payer: Self-pay | Admitting: *Deleted

## 2016-03-25 DIAGNOSIS — Z96652 Presence of left artificial knee joint: Secondary | ICD-10-CM | POA: Diagnosis not present

## 2016-03-25 DIAGNOSIS — M25562 Pain in left knee: Secondary | ICD-10-CM | POA: Diagnosis not present

## 2016-03-25 NOTE — Telephone Encounter (Signed)
-----   Message from Garrel Ridgel, [place] sent at 03/02/2016  9:14 PM EDT ----- Tell him about the NuRemedy in the office ----- Message -----    From: Lolita Rieger    Sent: 02/29/2016   4:28 PM      To: Max Villa Herb, DPM  [employer] will not cover Metanx.  Do you want to change it to something else?  Kayelee Herbig

## 2016-03-25 NOTE — Telephone Encounter (Signed)
I'm calling to let you know I tried to get your Metanx authorized by your insurance.  I couldn't get it authorized.  "That's a given, it's listed as a food.  My insurance has never covered it.  I've been paying for it out of pocket all these years.  I been paying $150 or more a month.  I can no longer pay that.  As a matter of fact I have some other nerve issues going on.  My doctor has put me on Lyrica.  I'll see how that goes."  Okay, well Dr. Milinda Pointer said to let you know we sell NuRemedy which is similar to Metanx in the office if you would like to try it.  "He's been so good to me.  He took really good care of me.  He handed me off to Dr. Cannon Kettle and I like her as well.  I really enjoy coming there, you all take such good care of me when I come in."  Thank you so much, that's great to hear.

## 2016-03-25 NOTE — Telephone Encounter (Signed)
Thanks

## 2016-03-25 NOTE — Telephone Encounter (Signed)
I attempted to call patient to inform him we couldn't get authorization for Metanx. Dr. Milinda Pointer is recommending NuRemedy that we sale here at the office.  I left a message for him to call me back.

## 2016-03-28 DIAGNOSIS — Z96659 Presence of unspecified artificial knee joint: Secondary | ICD-10-CM | POA: Insufficient documentation

## 2016-04-04 ENCOUNTER — Encounter: Payer: Self-pay | Admitting: Sports Medicine

## 2016-04-04 ENCOUNTER — Ambulatory Visit (INDEPENDENT_AMBULATORY_CARE_PROVIDER_SITE_OTHER): Payer: Medicare Other | Admitting: Sports Medicine

## 2016-04-04 DIAGNOSIS — M79672 Pain in left foot: Secondary | ICD-10-CM | POA: Diagnosis not present

## 2016-04-04 DIAGNOSIS — E1149 Type 2 diabetes mellitus with other diabetic neurological complication: Secondary | ICD-10-CM | POA: Diagnosis not present

## 2016-04-04 DIAGNOSIS — I219 Acute myocardial infarction, unspecified: Secondary | ICD-10-CM

## 2016-04-04 DIAGNOSIS — M79671 Pain in right foot: Secondary | ICD-10-CM

## 2016-04-04 DIAGNOSIS — E109 Type 1 diabetes mellitus without complications: Secondary | ICD-10-CM | POA: Insufficient documentation

## 2016-04-04 DIAGNOSIS — B351 Tinea unguium: Secondary | ICD-10-CM

## 2016-04-04 DIAGNOSIS — E782 Mixed hyperlipidemia: Secondary | ICD-10-CM | POA: Insufficient documentation

## 2016-04-04 DIAGNOSIS — I739 Peripheral vascular disease, unspecified: Secondary | ICD-10-CM

## 2016-04-04 DIAGNOSIS — G4730 Sleep apnea, unspecified: Secondary | ICD-10-CM | POA: Insufficient documentation

## 2016-04-04 DIAGNOSIS — E1065 Type 1 diabetes mellitus with hyperglycemia: Secondary | ICD-10-CM | POA: Insufficient documentation

## 2016-04-04 HISTORY — DX: Acute myocardial infarction, unspecified: I21.9

## 2016-04-04 NOTE — Progress Notes (Signed)
Patient ID: Lawrence [name], male   DOB: 1951/03/23, 65 y.o.   MRN: [id number] Subjective: Lawrence [name] is a 65 y.o. male patient with history of type 2 diabetes who returns to office today complaining of long, painful nails  while ambulating in shoes. Patient states that the glucose reading this morning was 68 mg/dl. Patient is s/p L TKA and is doing well. Patient denies any new cramping, numbness, burning or tingling in the legs. Neuropathy as unchanged from prior.   Patient Active Problem List   Diagnosis Date Noted  . Controlled type 2 diabetes mellitus without complication ([place]) 99991111  . Combined fat and carbohydrate induced hyperlipemia 04/04/2016  . Myocardial infarction ([place]) 04/04/2016  . Apnea, sleep 04/04/2016  . H/O total knee replacement 03/28/2016  . S/P total knee arthroplasty 02/11/2016  . Acute sinusitis 11/06/2015  . Morbid (severe) obesity due to excess calories ([place]) 11/05/2015  . Arthritis of knee, degenerative 11/05/2015  . Hyperlipidemia 08/28/2015  . Type 2 diabetes mellitus ([place]) 08/28/2015  . CAD (coronary artery disease) 08/28/2015  . Lower extremity edema 08/28/2015  . GERD (gastroesophageal reflux disease) 08/28/2015  . OSA (obstructive sleep apnea) 08/28/2015  . Morbid obesity with BMI of 40.0-44.9, adult ([place]) 08/28/2015  . Edema leg 05/22/2015  . Benign essential HTN 05/18/2015   Current Outpatient Prescriptions on File Prior to Visit  Medication Sig Dispense Refill  . acetaminophen (TYLENOL) 500 MG tablet Take 500 mg by mouth every 6 (six) hours as needed.    . cetirizine (ZYRTEC) 10 MG tablet Take 10 mg by mouth daily.    . cholecalciferol (VITAMIN D) 1000 units tablet Take 1,000 Units by mouth daily.    [name] docusate sodium (COLACE) 100 MG capsule Take 200 mg by mouth 2 (two) times daily.    . furosemide (LASIX) 20 MG tablet Take 20 mg by mouth 2 (two) times daily.     [name] glucagon (GLUCAGON EMERGENCY) 1 MG injection Inject 1 mg into the vein once  as needed. Reported on 02/11/2016    . insulin aspart (NOVOLOG) 100 UNIT/ML injection Inject 20 Units into the skin 3 (three) times daily.     . insulin glargine (LANTUS) 100 UNIT/ML injection Inject 20 Units into the skin at bedtime.     [name] L-Methylfolate-Algae-B12-B6 (METANX) 3-90.314-2-35 MG CAPS TAKE ONE (1) CAPSULE BY MOUTH 2 TIMES DAILY 60 capsule 11  . latanoprost (XALATAN) 0.005 % ophthalmic solution Place 1 drop into both eyes at bedtime.    . meloxicam (MOBIC) 15 MG tablet Take 1 tablet by mouth daily.    . metoprolol tartrate (LOPRESSOR) 25 MG tablet Take 25 mg by mouth 2 (two) times daily.    . Multiple Vitamin (MULTIVITAMIN) capsule Take 1 capsule by mouth daily.    [name] omeprazole (PRILOSEC) 20 MG capsule Take 20 mg by mouth daily.    [name] oxyCODONE (OXY IR/ROXICODONE) 5 MG immediate release tablet Take 1-2 tablets (5-10 mg total) by mouth every 4 (four) hours as needed for severe pain or breakthrough pain. 80 tablet 0  . Plant Sterols and Stanols (CHOLESTOFF PO) Take 2 capsules by mouth 2 (two) times daily.    . ranolazine (RANEXA) 1000 MG SR tablet TAKE 1 TABLET TWICE A DAY    . traMADol (ULTRAM) 50 MG tablet Take 1-2 tablets (50-100 mg total) by mouth every 4 (four) hours as needed for moderate pain. 60 tablet 1  . vitamin C (ASCORBIC ACID) 500 MG tablet Take 500 mg by mouth  2 (two) times daily.      No current facility-administered medications on file prior to visit.   Allergies  Allergen Reactions  . Imdur [Isosorbide Nitrate] Nausea And Vomiting, Other (See Comments) and Nausea Only  . Statins Other (See Comments)    BODY PAIN BODY PAIN  . Lipitor [Atorvastatin] Other (See Comments) and Rash    BODY PAIN    Objective: General: Patient is awake, alert, and oriented x 3 and in no acute distress.  Integument: Skin is warm, dry and supple bilateral. Nails are tender, long, thickened and  dystrophic with subungual debris, consistent with onychomycosis, 1-5 bilateral; dry blood  is noted under left hallux toenail. No signs of infection. No open lesions or preulcerative lesions present bilateral. Remaining integument unremarkable.  Vasculature:  Dorsalis Pedis pulse 2/4 bilateral. Posterior Tibial pulse  1/4 bilateral.  Capillary fill time <3 sec 1-5 bilateral. Scant hair growth to the level of the digits. Temperature gradient within normal limits. No varicosities present bilateral. 1+ pitting edema present bilateral.   Neurology: The patient has intact sensation measured with a 5.07/10g Semmes Weinstein Monofilament at all pedal sites bilateral . Vibratory sensation diminished bilateral with tuning fork. No Babinski sign present bilateral.   Musculoskeletal: No gross pedal deformities noted bilateral. Muscular strength 5/5 in all lower extremity muscular groups bilateral without pain or limitation on range of motion . No tenderness with calf compression bilateral.  Assessment and Plan: Problem List Items Addressed This Visit    None    Visit Diagnoses    Dermatophytosis of nail    -  Primary    Type II diabetes mellitus with neurological manifestations (HCC)        Foot pain, bilateral        PVD (peripheral vascular disease) ([place])          -Examined patient. -Discussed and educated patient on diabetic foot care, especially with  regards to the vascular, neurological and musculoskeletal systems.  -Stressed the importance of good glycemic control and the detriment of not  controlling glucose levels in relation to the foot. -Mechanically debrided all nails 1-5 bilateral using sterile nail nipper and filed with dremel without incident  -Cont with compression stockings -Answered all patient questions -Patient to returnin 3 months for diabetic foot care -Patient advised to call the office if any problems or questions arise in the meantime.  Lawrence [name], DPM

## 2016-04-07 ENCOUNTER — Encounter: Payer: Self-pay | Admitting: Primary Care

## 2016-04-07 ENCOUNTER — Ambulatory Visit (INDEPENDENT_AMBULATORY_CARE_PROVIDER_SITE_OTHER): Payer: Medicare Other | Admitting: Primary Care

## 2016-04-07 VITALS — BP 124/64 | HR 69 | Temp 98.0°F | Ht 71.0 in | Wt 300.8 lb

## 2016-04-07 DIAGNOSIS — Z794 Long term (current) use of insulin: Secondary | ICD-10-CM

## 2016-04-07 DIAGNOSIS — E119 Type 2 diabetes mellitus without complications: Secondary | ICD-10-CM

## 2016-04-07 DIAGNOSIS — M25532 Pain in left wrist: Secondary | ICD-10-CM | POA: Diagnosis not present

## 2016-04-07 DIAGNOSIS — G5602 Carpal tunnel syndrome, left upper limb: Secondary | ICD-10-CM | POA: Diagnosis not present

## 2016-04-07 MED ORDER — INSULIN ASPART 100 UNIT/ML ~~LOC~~ SOLN: 20.0000 [IU] | Freq: Three times a day (TID) | SUBCUTANEOUS | Status: AC

## 2016-04-07 NOTE — Patient Instructions (Addendum)
Apply Miconazole cream twice daily to the current rash for 7 days.  Apply Gold Bond or Cornstarch under the abdominal folds to prevent skin breakdown.  Please notify me if no improvement in 2 weeks.  It was a pleasure to see you today!

## 2016-04-07 NOTE — Progress Notes (Signed)
Pre visit review using our clinic review tool, if applicable. No additional management support is needed unless otherwise documented below in the visit note. 

## 2016-04-07 NOTE — Progress Notes (Signed)
Subjective:    Patient ID: Lawrence [name], male    DOB: [birth year], 65 y.o.   MRN: [id number]  HPI  Lawrence [name] is a 65 year old male who presents today with a chief complaint of rash. His rash is located to the anterior left lower extremity just proximal to his left patella. He had another rash that began on May 21st which was located to his left lateral leg. The initial rash has since improved. His most recent rash has been present for about 4-5 days. He's recently been outside in the yard working on a shed. He's not applied anything OTC for his rash. He does have a prescription for miconazole cream that was provided during his recent hospitalization in April for his knee replacement. He's not applied the miconazole to his current rash.  He also reports frequent sweating with rashes and occasional skin breakdown under his abdominal folds, especially in the summer months. He's not applying anything under his abdominal folds.   Denies fevers, increased size in rash, pain, itching, weakness, dizziness, headaches, fatigue.   Review of Systems  Constitutional: Negative for fever and fatigue.  Skin: Positive for rash.  Neurological: Negative for weakness and headaches.       Past Medical History  Diagnosis Date  . Muscle pain   . Sinus complaint   . Dizzy   . Glaucoma   . Osteoarthritis   . Diabetes ([place])   . Fatigue   . Night sweats   . Poor circulation   . HBP (high blood pressure)   . Skin cancer   . COPD, mild ([place])   . Bruises easily   . Elevated lipids   . Neuropathy ([place])   . Sleep apnea   . CAD (coronary artery disease)     07/2007     Social History   Social History  . Marital Status: Married    Spouse Name: N/A  . Number of Children: N/A  . Years of Education: N/A   Occupational History  . Not on file.   Social History Main Topics  . Smoking status: Former Smoker -- 1.00 packs/day for 16 years    Types: Cigarettes    Start date: 11/03/1969    Quit date:  07/22/1986  . Smokeless tobacco: Never Used  . Alcohol Use: 0.0 oz/week    0 Standard drinks or equivalent per week     Comment: OCCASIONALLY  . Drug Use: No  . Sexual Activity: Not on file   Other Topics Concern  . Not on file   Social History Narrative    Past Surgical History  Procedure Laterality Date  . Eye surgery Bilateral   . Appendectomy    . Cataract extraction w/ intraocular lens  implant, bilateral Bilateral   . Knee arthroscopy Right   . Knee arthroplasty Left 02/11/2016    Procedure: COMPUTER ASSISTED TOTAL KNEE ARTHROPLASTY;  Surgeon: Dereck Leep, MD;  Location: [facility] ORS;  Service: Orthopedics;  Laterality: Left;    Family History  Problem Relation Age of Onset  . Hypertension Mother   . Lupus Mother   . Hypertension Father   . Diabetes Father   . Arthritis Maternal Grandmother   . Arthritis Maternal Grandfather   . Arthritis Paternal Grandmother   . Arthritis Paternal Grandfather     Allergies  Allergen Reactions  . Imdur [Isosorbide Nitrate] Nausea And Vomiting, Other (See Comments) and Nausea Only  . Statins Other (See Comments)    BODY PAIN  BODY PAIN  . Lipitor [Atorvastatin] Other (See Comments) and Rash    BODY PAIN    Current Outpatient Prescriptions on File Prior to Visit  Medication Sig Dispense Refill  . cetirizine (ZYRTEC) 10 MG tablet Take 10 mg by mouth daily.    . cholecalciferol (VITAMIN D) 1000 units tablet Take 1,000 Units by mouth daily.    [name] docusate sodium (COLACE) 100 MG capsule Take 200 mg by mouth 2 (two) times daily.    . furosemide (LASIX) 20 MG tablet Take 20 mg by mouth 2 (two) times daily.     [name] glucagon (GLUCAGON EMERGENCY) 1 MG injection Inject 1 mg into the vein once as needed. Reported on 02/11/2016    . insulin glargine (LANTUS) 100 UNIT/ML injection Inject 20 Units into the skin at bedtime.     [name] latanoprost (XALATAN) 0.005 % ophthalmic solution Place 1 drop into both eyes at bedtime.    . metoprolol tartrate  (LOPRESSOR) 25 MG tablet Take 25 mg by mouth 2 (two) times daily.    . Multiple Vitamin (MULTIVITAMIN) capsule Take 1 capsule by mouth daily.    [name] omeprazole (PRILOSEC) 20 MG capsule Take 20 mg by mouth daily.    . Plant Sterols and Stanols (CHOLESTOFF PO) Take 2 capsules by mouth 2 (two) times daily.    . ranolazine (RANEXA) 1000 MG SR tablet TAKE 1 TABLET TWICE A DAY    . vitamin C (ASCORBIC ACID) 500 MG tablet Take 500 mg by mouth 2 (two) times daily.      No current facility-administered medications on file prior to visit.    BP 124/64 mmHg  Pulse 69  Temp(Src) 98 F (36.7 C) (Oral)  Ht 5\' 11"  (1.803 m)  Wt 300 lb 12.8 oz (136.442 kg)  BMI 41.97 kg/m2  SpO2 96%    Objective:   Physical Exam  Constitutional: He appears well-nourished.  Neck: Neck supple.  Cardiovascular: Normal rate and regular rhythm.   Pulmonary/Chest: Effort normal and breath sounds normal.  Skin: Skin is warm and dry.  3 cm irregular circular shaped rash to left anterior lower extremity proximal to left knee representative of tinea corporis. Skin intact. Well healing prior rash to left lateral lower extremity.          Assessment & Plan:  Rash:  Located to left anterior lower extremity x 4-5 days. Rash represents Tinea Corporis. Will have him apply his miconazole cream BID x 7 days. Discussed the application of Gold Bond powder under abdominal folds to prevent skin breakdown. Rash today does not appear infectious, skin intact. Return precautions provided.

## 2016-04-09 DIAGNOSIS — G608 Other hereditary and idiopathic neuropathies: Secondary | ICD-10-CM | POA: Diagnosis not present

## 2016-04-09 DIAGNOSIS — G5603 Carpal tunnel syndrome, bilateral upper limbs: Secondary | ICD-10-CM | POA: Diagnosis not present

## 2016-04-21 ENCOUNTER — Encounter
Admission: RE | Admit: 2016-04-21 | Discharge: 2016-04-21 | Disposition: A | Discharge status: Home / Self Care | Payer: Medicare Other | Source: Ambulatory Visit | Attending: Orthopedic Surgery | Admitting: Orthopedic Surgery

## 2016-04-21 DIAGNOSIS — I10 Essential (primary) hypertension: Secondary | ICD-10-CM | POA: Diagnosis not present

## 2016-04-21 DIAGNOSIS — G5602 Carpal tunnel syndrome, left upper limb: Secondary | ICD-10-CM | POA: Diagnosis not present

## 2016-04-21 DIAGNOSIS — E119 Type 2 diabetes mellitus without complications: Secondary | ICD-10-CM | POA: Diagnosis not present

## 2016-04-21 DIAGNOSIS — K219 Gastro-esophageal reflux disease without esophagitis: Secondary | ICD-10-CM | POA: Diagnosis not present

## 2016-04-21 DIAGNOSIS — Z471 Aftercare following joint replacement surgery: Secondary | ICD-10-CM | POA: Diagnosis not present

## 2016-04-21 DIAGNOSIS — Z79899 Other long term (current) drug therapy: Secondary | ICD-10-CM | POA: Diagnosis not present

## 2016-04-21 DIAGNOSIS — Z01812 Encounter for preprocedural laboratory examination: Secondary | ICD-10-CM | POA: Diagnosis not present

## 2016-04-21 DIAGNOSIS — E785 Hyperlipidemia, unspecified: Secondary | ICD-10-CM | POA: Diagnosis not present

## 2016-04-21 DIAGNOSIS — Z87891 Personal history of nicotine dependence: Secondary | ICD-10-CM | POA: Diagnosis not present

## 2016-04-21 DIAGNOSIS — I2510 Atherosclerotic heart disease of native coronary artery without angina pectoris: Secondary | ICD-10-CM | POA: Diagnosis not present

## 2016-04-21 DIAGNOSIS — Z6841 Body Mass Index (BMI) 40.0 and over, adult: Secondary | ICD-10-CM | POA: Diagnosis not present

## 2016-04-21 DIAGNOSIS — Z01810 Encounter for preprocedural cardiovascular examination: Secondary | ICD-10-CM | POA: Diagnosis not present

## 2016-04-21 DIAGNOSIS — E669 Obesity, unspecified: Secondary | ICD-10-CM | POA: Diagnosis not present

## 2016-04-21 DIAGNOSIS — J449 Chronic obstructive pulmonary disease, unspecified: Secondary | ICD-10-CM | POA: Diagnosis not present

## 2016-04-21 DIAGNOSIS — Z794 Long term (current) use of insulin: Secondary | ICD-10-CM | POA: Diagnosis not present

## 2016-04-21 DIAGNOSIS — I252 Old myocardial infarction: Secondary | ICD-10-CM | POA: Diagnosis not present

## 2016-04-21 DIAGNOSIS — Z96652 Presence of left artificial knee joint: Secondary | ICD-10-CM | POA: Diagnosis not present

## 2016-04-21 DIAGNOSIS — G4730 Sleep apnea, unspecified: Secondary | ICD-10-CM | POA: Diagnosis not present

## 2016-04-21 HISTORY — DX: Acute myocardial infarction, unspecified: I21.9

## 2016-04-21 LAB — BASIC METABOLIC PANEL
ANION GAP: 6 (ref 5–15)
BUN: 21 mg/dL — ABNORMAL HIGH (ref 6–20)
CO2: 29 mmol/L (ref 22–32)
Calcium: 9.2 mg/dL (ref 8.9–10.3)
Chloride: 105 mmol/L (ref 101–111)
Creatinine, Ser: 0.98 mg/dL (ref 0.61–1.24)
Glucose, Bld: 102 mg/dL — ABNORMAL HIGH (ref 65–99)
POTASSIUM: 4 mmol/L (ref 3.5–5.1)
SODIUM: 140 mmol/L (ref 135–145)

## 2016-04-21 LAB — CBC
HCT: 35.7 % — ABNORMAL LOW (ref 40.0–52.0)
Hemoglobin: 11.9 g/dL — ABNORMAL LOW (ref 13.0–18.0)
MCH: 29.8 pg (ref 26.0–34.0)
MCHC: 33.2 g/dL (ref 32.0–36.0)
MCV: 89.9 fL (ref 80.0–100.0)
PLATELETS: 271 10*3/uL (ref 150–440)
RBC: 3.97 MIL/uL — AB (ref 4.40–5.90)
RDW: 14.4 % (ref 11.5–14.5)
WBC: 6.6 10*3/uL (ref 3.8–10.6)

## 2016-04-21 NOTE — Pre-Procedure Instructions (Signed)
ANESTHESIA - EKG AND ECHO   Component Name Value Range  Vent Rate (bpm) 74   PR Interval (msec) 160   QRS Interval (msec) 84   QT Interval (msec) 374   QTc (msec) 415    Result Narrative  Normal sinus rhythm Low voltage QRS Cannot rule out Anterior infarct , age undetermined Abnormal ECG No previous ECGs available I reviewed and concur with this report. Electronically signed [id number] MD, Darnell Level ([id number]) on 05/16/2015 6:02:43 PM   Status Results Details   Encounter Summary  2004 March  Echocardiogram 2D complete7/19/2016  [place]  Component Name Value Range  LV Ejection Fraction (%) 50   Aortic Valve Stenosis Grade none   Aortic Valve Regurgitation Grade none   Aortic Valve Max Velocity (m/s) 1.3 m/sec   Aortic Valve Stenosis Mean Gradient (mmHg) 4.0 mmHg   Mitral Valve Regurgitation Grade mild   Mitral Valve Stenosis Grade none   Tricuspid Valve Regurgitation Grade mild   Tricuspid Valve Regurgitation Max Velocity (m/s) 2.7 m/sec   Right Ventricle Systolic Pressure (mmHg) XX123456 mmHg   LV End Diastolic Diameter (cm) 4.3 cm  LV End Systolic Diameter (cm) 3.2 cm  LV Septum Wall Thickness (cm) 1.3 cm  LV Posterior Wall Thickness (cm) 0.95 cm  Left Atrium Diameter (cm) 4.3 cm   Result Narrative                      CARDIOLOGY DEPARTMENT                        [place]                           [id number]                   [facility]                       Acct #: [id number]         [address], [place], [place] [address]             Date: 05/22/2015 11:06 AM                                                                  Adult Male Age: 64 yrs                     ECHOCARDIOGRAM REPORT                        Outpatient          STUDY:CHEST WALL         TAPE:0000:00: 0:00:00          KC::KCWC           ECHO:Yes   DOPPLER:Yes  FILE:0000-000-000              MD1:          COLOR:Yes   CONTRAST:No  MACHINE:Philips            Height: 70 in      RV BIOPSY:No         3D:No    SOUND QLTY:Moderate           Weight: 311 lb         MEDIUM:None                                                                  BSA: 2.5 m2 ___________________________________________________________________________________________                  HISTORY:DOE                   REASON:Assess, LV function               INDICATION:R06.02 Shortness of breath  ___________________________________________________________________________________________ ECHOCARDIOGRAPHIC MEASUREMENTS 2D DIMENSIONS AORTA             Values      Normal Range      MAIN PA          Values      Normal Range           Annulus:  nm*       [2.3 - 2.9]                PA Main:  nm*       [1.5 - 2.1]         Aorta Sin:  nm*       [3.1 - 3.7]       RIGHT VENTRICLE       ST Junction:  nm*       [2.6 - 3.2]                RV Base:  nm*       [ < 4.2]         Asc.Aorta:  nm*       [2.6 - 3.4]                 RV Mid:  nm*       [ < 3.5]  LEFT VENTRICLE                                        RV Length:  nm*       [ < 8.6]             LVIDd:  4.3 cm    [4.2 - 5.9]       INFERIOR VENA CAVA             LVIDs:  3.2 cm                              Max. IVC:  nm*       [ <= 2.1]                FS:  25.1 %    [> 25]                    Min. IVC:  nm*               SWT:  1.3 cm    [0.6 - 1.0]                   ------------------               PWT:  0.95 cm   [0.6 - 1.0]                   nm* - not measured  LEFT ATRIUM           LA Diam:  4.3 cm    [3.0 - 4.0]       LA A4C Area:  nm*       [ < 20]         LA Volume:  nm*       [18 - 58]  ___________________________________________________________________________________________ ECHOCARDIOGRAPHIC DESCRIPTIONS  AORTIC ROOT         Size:Normal   Dissection:No dissection  AORTIC VALVE     Leaflets:Tricuspid        Morphology:MILDLY THICKENED     Mobility:Fully mobile  LEFT VENTRICLE          Size:Normal             Anterior:Normal  Contraction:Normal              Lateral:Normal   Closest EF:50% (Estimated)      Septal:Normal    LV Masses:No Masses            Apical:Normal          [id number]               Inferior:Normal                                Posterior:Normal Dias.FxClass:(Grade 1) relaxation abnormal, E/A reversal  MITRAL VALVE     Leaflets:Normal             Mobility:Fully mobile   Morphology:Normal  LEFT ATRIUM         Size:Normal            LA Masses:No masses    IA Septum:Normal IAS  MAIN PA         Size:Normal  PULMONIC VALVE   Morphology:Normal             Mobility:Fully mobile  RIGHT VENTRICLE    RV Masses:No Masses              Size:Normal    Free Wall:Normal          Contraction:Normal  TRICUSPID VALVE     Leaflets:Normal             Mobility:Fully mobile   Morphology:Normal  RIGHT ATRIUM         Size:Normal             RA Other:None      RA Mass:No masses  PERICARDIUM        Fluid:No effusion  INFERIOR VENACAVA         Size:Normal Normal respiratory collapse   ____________________________________________________________________ DOPPLER ECHO and OTHER SPECIAL PROCEDURES    Aortic:No AR                         No AS           129.0 cm/sec peak vel  6.7 mmHg peak grad           4.0 mmHg mean grad            3.6 cm^2 by DOPPLER     Mitral:MILD MR                       No MS                                         5.0 cm^2 by DOPPLER           MV Inflow E Vel=63.1 cm/sec   MV Annulus E'Vel=6.8 cm/sec           E/E'Ratio=9.3  Tricuspid:MILD TR                       No TS           268.0 cm/sec peak TR vel      33.7 mmHg peak RV pressure  Pulmonary:TRIVIAL PR                    No PS     ___________________________________________________________________________________________ INTERPRETATION NORMAL LEFT VENTRICULAR SYSTOLIC FUNCTION WITH AN ESTIMATED EF = 50-55 % NORMAL RIGHT VENTRICULAR SYSTOLIC FUNCTION MILD  TRICUSPID AND MITRAL VALVE INSUFFICIENCY NO VALVULAR STENOSIS   ___________________________________________________________________________________________ Electronically signed by: MD Serafina Royals on 05/22/2015 11:56 AM             Performed By: Johnathan Hausen, RDCS, RVT       Ordering Physician: Etta Quill  ___________________________________________________________________________________________   Status Results Details

## 2016-04-21 NOTE — Patient Instructions (Signed)
Your procedure is scheduled on: Thursday 04/24/16 Report to Day Surgery. 2ND FLOOR MEDICAL MALL ENTRANCE To find out your arrival time please call [phone] between 1PM - 3PM on Wednesday 04/23/16.  Remember: Instructions that are not followed completely may result in serious medical risk, up to and including death, or upon the discretion of your surgeon and anesthesiologist your surgery may need to be rescheduled.    __X__ 1. Do not eat food or drink liquids after midnight. No gum chewing or hard candies.     __X__ 2. No Alcohol for 24 hours before or after surgery.   ____ 3. Bring all medications with you on the day of surgery if instructed.    __X__ 4. Notify your doctor if there is any change in your medical condition     (cold, fever, infections).     Do not wear jewelry, make-up, hairpins, clips or nail polish.  Do not wear lotions, powders, or perfumes.   Do not shave 48 hours prior to surgery. Men may shave face and neck.  Do not bring valuables to the hospital.    [facility] is not responsible for any belongings or valuables.               Contacts, dentures or bridgework may not be worn into surgery.  Leave your suitcase in the car. After surgery it may be brought to your room.  For patients admitted to the hospital, discharge time is determined by your                treatment team.   Patients discharged the day of surgery will not be allowed to drive home.   Please read over the following fact sheets that you were given:   Surgical Site Infection Prevention   __X__ Take these medicines the morning of surgery with A SIP OF WATER:    1. CETIRIZINE  2. METOPROLOL  3. RANALOZINE  4.  5.  6.  ____ Fleet Enema (as directed)   __X__ Use CHG Soap as directed  ____ Use inhalers on the day of surgery  ____ Stop metformin 2 days prior to surgery    ____ Take 1/2 of usual insulin dose the night before surgery and none on the morning of surgery.   ____ Stop  Coumadin/Plavix/aspirin on   __X__ Stop Anti-inflammatories on TODAY (ALEVE)   __X__ Stop supplements until after surgery. TODAY (VITAMIN C)   __X__ Bring C-Pap to the hospital.

## 2016-04-23 MED ORDER — NITROGLYCERIN 5 MG/ML IV SOLN
INTRAVENOUS | Status: AC
  Filled 2016-04-23: qty 10

## 2016-04-23 NOTE — Pre-Procedure Instructions (Signed)
EKG sent to Anesthesia for review. 

## 2016-04-24 ENCOUNTER — Ambulatory Visit: Payer: Medicare Other | Admitting: Anesthesiology

## 2016-04-24 ENCOUNTER — Encounter: Payer: Self-pay | Admitting: *Deleted

## 2016-04-24 ENCOUNTER — Encounter
Admission: RE | Disposition: A | Discharge status: Home / Self Care | Payer: Self-pay | Source: Ambulatory Visit | Attending: Orthopedic Surgery

## 2016-04-24 ENCOUNTER — Ambulatory Visit
Admission: RE | Admit: 2016-04-24 | Discharge: 2016-04-24 | Disposition: A | Discharge status: Home / Self Care | Payer: Medicare Other | Source: Ambulatory Visit | Attending: Orthopedic Surgery | Admitting: Orthopedic Surgery

## 2016-04-24 DIAGNOSIS — G4730 Sleep apnea, unspecified: Secondary | ICD-10-CM | POA: Diagnosis not present

## 2016-04-24 DIAGNOSIS — Z01812 Encounter for preprocedural laboratory examination: Secondary | ICD-10-CM | POA: Diagnosis not present

## 2016-04-24 DIAGNOSIS — E119 Type 2 diabetes mellitus without complications: Secondary | ICD-10-CM | POA: Diagnosis not present

## 2016-04-24 DIAGNOSIS — E669 Obesity, unspecified: Secondary | ICD-10-CM | POA: Insufficient documentation

## 2016-04-24 DIAGNOSIS — E785 Hyperlipidemia, unspecified: Secondary | ICD-10-CM | POA: Insufficient documentation

## 2016-04-24 DIAGNOSIS — G5602 Carpal tunnel syndrome, left upper limb: Secondary | ICD-10-CM | POA: Diagnosis not present

## 2016-04-24 DIAGNOSIS — Z6841 Body Mass Index (BMI) 40.0 and over, adult: Secondary | ICD-10-CM | POA: Insufficient documentation

## 2016-04-24 DIAGNOSIS — J449 Chronic obstructive pulmonary disease, unspecified: Secondary | ICD-10-CM | POA: Insufficient documentation

## 2016-04-24 DIAGNOSIS — I10 Essential (primary) hypertension: Secondary | ICD-10-CM | POA: Diagnosis not present

## 2016-04-24 DIAGNOSIS — K219 Gastro-esophageal reflux disease without esophagitis: Secondary | ICD-10-CM | POA: Insufficient documentation

## 2016-04-24 DIAGNOSIS — Z01810 Encounter for preprocedural cardiovascular examination: Secondary | ICD-10-CM | POA: Diagnosis not present

## 2016-04-24 DIAGNOSIS — Z96652 Presence of left artificial knee joint: Secondary | ICD-10-CM | POA: Insufficient documentation

## 2016-04-24 DIAGNOSIS — I2510 Atherosclerotic heart disease of native coronary artery without angina pectoris: Secondary | ICD-10-CM | POA: Diagnosis not present

## 2016-04-24 DIAGNOSIS — I252 Old myocardial infarction: Secondary | ICD-10-CM | POA: Insufficient documentation

## 2016-04-24 DIAGNOSIS — I739 Peripheral vascular disease, unspecified: Secondary | ICD-10-CM | POA: Diagnosis not present

## 2016-04-24 DIAGNOSIS — Z79899 Other long term (current) drug therapy: Secondary | ICD-10-CM | POA: Insufficient documentation

## 2016-04-24 DIAGNOSIS — Z794 Long term (current) use of insulin: Secondary | ICD-10-CM | POA: Insufficient documentation

## 2016-04-24 DIAGNOSIS — Z87891 Personal history of nicotine dependence: Secondary | ICD-10-CM | POA: Insufficient documentation

## 2016-04-24 DIAGNOSIS — Z471 Aftercare following joint replacement surgery: Secondary | ICD-10-CM | POA: Diagnosis not present

## 2016-04-24 HISTORY — PX: CARPAL TUNNEL RELEASE: SHX101

## 2016-04-24 LAB — GLUCOSE, CAPILLARY
GLUCOSE-CAPILLARY: 221 mg/dL — AB (ref 65–99)
GLUCOSE-CAPILLARY: 285 mg/dL — AB (ref 65–99)

## 2016-04-24 SURGERY — CARPAL TUNNEL RELEASE
Anesthesia: General | Laterality: Left

## 2016-04-24 MED ORDER — ONDANSETRON HCL 4 MG/2ML IJ SOLN: 4.0000 mg | Freq: Once | INTRAMUSCULAR | Status: DC | PRN

## 2016-04-24 MED ORDER — BUPIVACAINE HCL (PF) 0.5 % IJ SOLN
INTRAMUSCULAR | Status: AC
  Filled 2016-04-24: qty 30

## 2016-04-24 MED ORDER — INSULIN ASPART 100 UNIT/ML ~~LOC~~ SOLN
SUBCUTANEOUS | Status: AC
  Filled 2016-04-24: qty 6

## 2016-04-24 MED ORDER — GLYCOPYRROLATE 0.2 MG/ML IJ SOLN
INTRAMUSCULAR | Status: DC | PRN
  Administered 2016-04-24: 0.2 mg via INTRAVENOUS

## 2016-04-24 MED ORDER — MIDAZOLAM HCL 2 MG/2ML IJ SOLN
INTRAMUSCULAR | Status: DC | PRN
Start: 2016-04-24 — End: 2016-04-24
  Administered 2016-04-24: 2 mg via INTRAVENOUS

## 2016-04-24 MED ORDER — FENTANYL CITRATE (PF) 100 MCG/2ML IJ SOLN: 25.0000 ug | INTRAMUSCULAR | Status: DC | PRN

## 2016-04-24 MED ORDER — PROPOFOL 10 MG/ML IV BOLUS
INTRAVENOUS | Status: DC | PRN
  Administered 2016-04-24: 200 mg via INTRAVENOUS

## 2016-04-24 MED ORDER — ONDANSETRON HCL 4 MG/2ML IJ SOLN
INTRAMUSCULAR | Status: DC | PRN
  Administered 2016-04-24: 4 mg via INTRAVENOUS

## 2016-04-24 MED ORDER — FAMOTIDINE 20 MG PO TABS
20.0000 mg | ORAL_TABLET | Freq: Once | ORAL | Status: AC
  Administered 2016-04-24: 20 mg via ORAL

## 2016-04-24 MED ORDER — BUPIVACAINE HCL 0.5 % IJ SOLN
INTRAMUSCULAR | Status: DC | PRN
  Administered 2016-04-24: 10 mL

## 2016-04-24 MED ORDER — INSULIN ASPART 100 UNIT/ML ~~LOC~~ SOLN
6.0000 [IU] | Freq: Once | SUBCUTANEOUS | Status: AC
  Administered 2016-04-24: 6 [IU] via SUBCUTANEOUS

## 2016-04-24 MED ORDER — HYDROCODONE-ACETAMINOPHEN 5-325 MG PO TABS: 1.0000 | ORAL_TABLET | Freq: Four times a day (QID) | ORAL | Status: DC | PRN

## 2016-04-24 MED ORDER — PHENYLEPHRINE HCL 10 MG/ML IJ SOLN
INTRAMUSCULAR | Status: DC | PRN
  Administered 2016-04-24 (×2): 100 ug via INTRAVENOUS

## 2016-04-24 MED ORDER — LIDOCAINE HCL (CARDIAC) 20 MG/ML IV SOLN
INTRAVENOUS | Status: DC | PRN
  Administered 2016-04-24: 100 mg via INTRAVENOUS

## 2016-04-24 MED ORDER — CEFAZOLIN SODIUM-DEXTROSE 2-4 GM/100ML-% IV SOLN
INTRAVENOUS | Status: AC
  Filled 2016-04-24: qty 100

## 2016-04-24 MED ORDER — CEFAZOLIN SODIUM-DEXTROSE 2-4 GM/100ML-% IV SOLN
2.0000 g | Freq: Once | INTRAVENOUS | Status: AC
  Administered 2016-04-24 (×2): 2 g via INTRAVENOUS

## 2016-04-24 MED ORDER — FAMOTIDINE 20 MG PO TABS
ORAL_TABLET | ORAL | Status: AC
  Filled 2016-04-24: qty 1

## 2016-04-24 MED ORDER — SODIUM CHLORIDE 0.9 % IV SOLN
INTRAVENOUS | Status: DC
  Administered 2016-04-24 (×2): via INTRAVENOUS

## 2016-04-24 MED ORDER — FENTANYL CITRATE (PF) 100 MCG/2ML IJ SOLN
INTRAMUSCULAR | Status: DC | PRN
  Administered 2016-04-24: 100 ug via INTRAVENOUS

## 2016-04-24 SURGICAL SUPPLY — 27 items
BANDAGE ACE 3X5.8 VEL STRL LF (GAUZE/BANDAGES/DRESSINGS) ×2 IMPLANT
BNDG ESMARK 4X12 TAN STRL LF (GAUZE/BANDAGES/DRESSINGS) ×2 IMPLANT
CANISTER SUCT 1200ML W/VALVE (MISCELLANEOUS) ×2 IMPLANT
CHLORAPREP W/TINT 26ML (MISCELLANEOUS) ×2 IMPLANT
CUFF TOURN 18 STER (MISCELLANEOUS) IMPLANT
ELECT CAUTERY NEEDLE 2.0 MIC (NEEDLE) IMPLANT
ELECT CAUTERY NEEDLE TIP 1.0 (MISCELLANEOUS)
ELECTRODE CAUTERY NEDL TIP 1.0 (MISCELLANEOUS) IMPLANT
GAUZE PETRO XEROFOAM 1X8 (MISCELLANEOUS) ×2 IMPLANT
GAUZE SPONGE 4X4 12PLY STRL (GAUZE/BANDAGES/DRESSINGS) ×2 IMPLANT
GAUZE XEROFORM 4X4 STRL (GAUZE/BANDAGES/DRESSINGS) ×2 IMPLANT
GLOVE BIOGEL PI IND STRL 9 (GLOVE) ×1 IMPLANT
GLOVE BIOGEL PI INDICATOR 9 (GLOVE) ×1
GLOVE SURG ORTHO 9.0 STRL STRW (GLOVE) ×2 IMPLANT
GOWN STRL REUS W/ TWL LRG LVL3 (GOWN DISPOSABLE) ×1 IMPLANT
GOWN STRL REUS W/TWL 2XL LVL3 (GOWN DISPOSABLE) ×2 IMPLANT
GOWN STRL REUS W/TWL LRG LVL3 (GOWN DISPOSABLE) ×1
GOWN SURG XXL (GOWNS) ×2 IMPLANT
KIT RM TURNOVER STRD PROC AR (KITS) ×2 IMPLANT
NS IRRIG 500ML POUR BTL (IV SOLUTION) ×2 IMPLANT
PACK EXTREMITY ARMC (MISCELLANEOUS) ×2 IMPLANT
PAD CAST CTTN 4X4 STRL (SOFTGOODS) ×1 IMPLANT
PADDING CAST COTTON 4X4 STRL (SOFTGOODS) ×1
STOCKINETTE STRL 4IN 9604848 (GAUZE/BANDAGES/DRESSINGS) ×2 IMPLANT
SUT ETHILON 4-0 (SUTURE) ×1
SUT ETHILON 4-0 FS2 18XMFL BLK (SUTURE) ×1
SUTURE ETHLN 4-0 FS2 18XMF BLK (SUTURE) ×1 IMPLANT

## 2016-04-24 NOTE — Discharge Instructions (Addendum)
Work on finger motion is much as possible. Loosen Ace wrap prior to discharge if fingers swell. Keep dressing clean and dry  AMBULATORY SURGERY  DISCHARGE INSTRUCTIONS  1) The drugs that you were given will stay in your system until tomorrow so for the next 24 hours you should not: A) Drive an automobile B) Make any legal decisions C) Drink any alcoholic beverage  2) You may resume regular meals tomorrow.  Today it is better to start with liquids and gradually work up to solid foods. You may eat anything you prefer, but it is better to start with liquids, then soup and crackers, and gradually work up to solid foods.  3) Please notify your doctor immediately if you have any unusual bleeding, trouble breathing, redness and pain at the surgery site, drainage, fever, or pain not relieved by medication.  4) Additional Instructions:  Please contact your physician with any problems or Same Day Surgery at [phone], Monday through Friday 6 am to 4 pm, or [place] at [facility] number at [phone].

## 2016-04-24 NOTE — Anesthesia Procedure Notes (Signed)
Procedure Name: LMA Insertion Date/Time: 04/24/2016 9:49 AM Performed by: Nelda Marseille Pre-anesthesia Checklist: Patient identified, Patient being monitored, Timeout performed, Emergency Drugs available and Suction available Patient Re-evaluated:Patient Re-evaluated prior to inductionOxygen Delivery Method: Circle system utilized Preoxygenation: Pre-oxygenation with 100% oxygen Intubation Type: IV induction Ventilation: Mask ventilation without difficulty LMA: LMA inserted LMA Size: 4.5 Tube type: Oral Number of attempts: 1 Placement Confirmation: positive ETCO2 and breath sounds checked- equal and bilateral Tube secured with: Tape Dental Injury: Teeth and Oropharynx as per pre-operative assessment

## 2016-04-24 NOTE — H&P (Signed)
Reviewed paper H+P, will be scanned into chart. No changes noted.  

## 2016-04-24 NOTE — Transfer of Care (Signed)
Immediate Anesthesia Transfer of Care Note  Patient: Lawrence [name]  Procedure(s) Performed: Procedure(s): CARPAL TUNNEL RELEASE (Left)  Patient Location: PACU  Anesthesia Type:General  Level of Consciousness: sedated  Airway & Oxygen Therapy: Patient Spontanous Breathing and Patient connected to face mask oxygen  Post-op Assessment: Report given to RN and Post -op Vital signs reviewed and stable  Post vital signs: Reviewed and stable  Last Vitals:  Filed Vitals:   04/24/16 0857  BP: 128/45  Pulse: 67  Temp: 36.3 C  Resp: 16    Last Pain:  Filed Vitals:   04/24/16 0900  PainSc: 5          Complications: No apparent anesthesia complications

## 2016-04-24 NOTE — Op Note (Signed)
04/24/2016  10:13 AM  PATIENT:  Lawrence [name]  65 y.o. male  PRE-OPERATIVE DIAGNOSIS:  LEFT CARPAL TUNNEL  POST-OPERATIVE DIAGNOSIS:  LEFT CARPAL TUNNEL  PROCEDURE:  Procedure(s): CARPAL TUNNEL RELEASE (Left)  SURGEON: Laurene Footman, MD  ASSISTANTS: None  ANESTHESIA:   general  EBL:     BLOOD ADMINISTERED:none  DRAINS: none   LOCAL MEDICATIONS USED:  MARCAINE     SPECIMEN:  No Specimen  DISPOSITION OF SPECIMEN:  N/A  COUNTS:  YES  TOURNIQUET:   Total Tourniquet Time Documented: Upper Arm (Left) - 12 minutes Total: Upper Arm (Left) - 12 minutes   IMPLANTS: None  DICTATION: .Dragon Dictation patient brought the operating room and after adequate anesthesia was obtained, the left arm was prepped and draped in sterile fashion. After patient identification and timeout procedures were completed, tourniquet was raised. Approximate 2-1/2 cm incision was made in line with ring metacarpal and subcutaneous tissues tissue spread. The transcarpal ligament was identified with some thenar musculature overlying it which was elevated. A small opening was made in a vascular hemostat placed underneath the transverse carpal ligament to protect the underlying structures. Release was carried out proximally to proximal to the wrist flexion crease. Distally drilling out to where there was some fat visible around the nerve consistent with lack of compression. In the midportion of the carpal tunnel there was an hourglass constriction of the nerve and the perineurium and was released as it appeared to be constricting is well. The flexor tendons were noted of mild tenosynovitis but there are no masses present. At this point there was adequate release of the nerve with good vascular blush to the nerve. The wound was irrigated and then 10 cc of half percent Sensorcaine was infiltrated in subcutaneous tissues tissue for postop analgesia. Wound was closed simple interrupted 5-0 nylon skin suture. Xeroform  4 x 4 web roll and Ace wrap applied and tourniquet let down  PLAN OF CARE: Discharge to home after PACU  PATIENT DISPOSITION:  PACU - hemodynamically stable.

## 2016-04-24 NOTE — Anesthesia Preprocedure Evaluation (Addendum)
Anesthesia Evaluation  Patient identified by MRN, date of birth, ID band Patient awake    Reviewed: Allergy & Precautions, NPO status , Patient's Chart, lab work & pertinent test results, reviewed documented beta blocker date and time   Airway Mallampati: III  TM Distance: >3 FB     Dental  (+) Chipped   Pulmonary sleep apnea , COPD, former smoker,           Cardiovascular hypertension, Pt. on medications and Pt. on home beta blockers + CAD, + Past MI and + Peripheral Vascular Disease       Neuro/Psych    GI/Hepatic GERD  Controlled,  Endo/Other  diabetes, Type 2Morbid obesity  Renal/GU      Musculoskeletal  (+) Arthritis ,   Abdominal   Peds  Hematology   Anesthesia Other Findings Obese. Anemic 11.9. No cardiac symptoms. EKG poor R wave prog.  Reproductive/Obstetrics                            Anesthesia Physical Anesthesia Plan  ASA: III  Anesthesia Plan: General   Post-op Pain Management:    Induction: Intravenous  Airway Management Planned: Oral ETT and LMA  Additional Equipment:   Intra-op Plan:   Post-operative Plan:   Informed Consent: I have reviewed the patients History and Physical, chart, labs and discussed the procedure including the risks, benefits and alternatives for the proposed anesthesia with the patient or authorized representative who has indicated his/her understanding and acceptance.     Plan Discussed with: CRNA  Anesthesia Plan Comments:         Anesthesia Quick Evaluation

## 2016-04-25 NOTE — Anesthesia Postprocedure Evaluation (Signed)
Anesthesia Post Note  Patient: Lawrence [name]  Procedure(s) Performed: Procedure(s) (LRB): CARPAL TUNNEL RELEASE (Left)  Patient location during evaluation: PACU Anesthesia Type: General Level of consciousness: awake and alert Pain management: pain level controlled Vital Signs Assessment: post-procedure vital signs reviewed and stable Respiratory status: spontaneous breathing, nonlabored ventilation, respiratory function stable and patient connected to nasal cannula oxygen Cardiovascular status: blood pressure returned to baseline and stable Postop Assessment: no signs of nausea or vomiting Anesthetic complications: no    Last Vitals:  Filed Vitals:   04/24/16 1111 04/24/16 1149  BP: 130/47 135/48  Pulse: 70 71  Temp: 36.4 C   Resp: 16 18    Last Pain:  Filed Vitals:   04/24/16 1150  PainSc: 0-No pain                 Toria Monte S

## 2016-05-02 ENCOUNTER — Ambulatory Visit (INDEPENDENT_AMBULATORY_CARE_PROVIDER_SITE_OTHER): Payer: Medicare Other | Admitting: Sports Medicine

## 2016-05-02 ENCOUNTER — Encounter: Payer: Self-pay | Admitting: Sports Medicine

## 2016-05-02 DIAGNOSIS — I739 Peripheral vascular disease, unspecified: Secondary | ICD-10-CM

## 2016-05-02 DIAGNOSIS — L89891 Pressure ulcer of other site, stage 1: Secondary | ICD-10-CM | POA: Diagnosis not present

## 2016-05-02 DIAGNOSIS — E1149 Type 2 diabetes mellitus with other diabetic neurological complication: Secondary | ICD-10-CM

## 2016-05-02 DIAGNOSIS — L97521 Non-pressure chronic ulcer of other part of left foot limited to breakdown of skin: Secondary | ICD-10-CM

## 2016-05-02 DIAGNOSIS — L03032 Cellulitis of left toe: Secondary | ICD-10-CM

## 2016-05-02 DIAGNOSIS — L02612 Cutaneous abscess of left foot: Secondary | ICD-10-CM | POA: Diagnosis not present

## 2016-05-02 MED ORDER — AMOXICILLIN-POT CLAVULANATE 875-125 MG PO TABS: 1.0000 | ORAL_TABLET | Freq: Two times a day (BID) | ORAL | Status: DC

## 2016-05-02 NOTE — Progress Notes (Signed)
Patient ID: Lawrence [name], male   DOB: 1951-03-18, 65 y.o.   MRN: [id number] Subjective: Lawrence [name] is a 65 y.o. male patient seen in office for evaluation of ulceration of the left 2nd toe. Patient has a history of diabetes and a blood glucose level today of 94 mg/dl.   Patient states that started as a blister a few weeks ago, noticed drainage on sock.  Denies nausea/fever/vomiting/chills/night sweats/shortness of breath/pain. Patient has no other pedal complaints at this time.  Patient Active Problem List   Diagnosis Date Noted  . Controlled type 2 diabetes mellitus without complication ([place]) 54/49/2010  . Combined fat and carbohydrate induced hyperlipemia 04/04/2016  . Myocardial infarction ([place]) 04/04/2016  . Apnea, sleep 04/04/2016  . H/O total knee replacement 03/28/2016  . S/P total knee arthroplasty 02/11/2016  . Acute sinusitis 11/06/2015  . Morbid (severe) obesity due to excess calories ([place]) 11/05/2015  . Arthritis of knee, degenerative 11/05/2015  . Hyperlipidemia 08/28/2015  . Type 2 diabetes mellitus ([place]) 08/28/2015  . CAD (coronary artery disease) 08/28/2015  . Lower extremity edema 08/28/2015  . GERD (gastroesophageal reflux disease) 08/28/2015  . OSA (obstructive sleep apnea) 08/28/2015  . Morbid obesity with BMI of 40.0-44.9, adult ([place]) 08/28/2015  . Edema leg 05/22/2015  . Benign essential HTN 05/18/2015   Current Outpatient Prescriptions on File Prior to Visit  Medication Sig Dispense Refill  . cetirizine (ZYRTEC) 10 MG tablet Take 10 mg by mouth daily.    . cholecalciferol (VITAMIN D) 1000 units tablet Take 1,000 Units by mouth daily.    [name] docusate sodium (COLACE) 100 MG capsule Take 200 mg by mouth 2 (two) times daily.    . furosemide (LASIX) 20 MG tablet Take 20 mg by mouth 2 (two) times daily.     [name] glucagon (GLUCAGON EMERGENCY) 1 MG injection Inject 1 mg into the vein once as needed. Reported on 02/11/2016    . HYDROcodone-acetaminophen (NORCO) 5-325 MG  tablet Take 1 tablet by mouth every 6 (six) hours as needed for moderate pain. 20 tablet 0  . insulin aspart (NOVOLOG) 100 UNIT/ML injection Inject 20 Units into the skin 3 (three) times daily. 10 mL 5  . insulin glargine (LANTUS) 100 UNIT/ML injection Inject 20 Units into the skin at bedtime.     [name] latanoprost (XALATAN) 0.005 % ophthalmic solution Place 1 drop into both eyes at bedtime.    . metoprolol tartrate (LOPRESSOR) 25 MG tablet Take 25 mg by mouth 2 (two) times daily.    . Multiple Vitamin (MULTIVITAMIN) capsule Take 1 capsule by mouth daily.    . Naproxen Sodium (ALEVE PO) Take 220-440 mg by mouth daily as needed (pain).     . Plant Sterols and Stanols (CHOLESTOFF PO) Take 2 capsules by mouth 2 (two) times daily.    . ranolazine (RANEXA) 1000 MG SR tablet TAKE 1 TABLET TWICE A DAY    . vitamin C (ASCORBIC ACID) 500 MG tablet Take 500 mg by mouth 2 (two) times daily.      No current facility-administered medications on file prior to visit.   Allergies  Allergen Reactions  . Imdur [Isosorbide Nitrate] Nausea And Vomiting, Other (See Comments) and Nausea Only  . Statins Other (See Comments)    BODY PAIN BODY PAIN  . Lipitor [Atorvastatin] Other (See Comments) and Rash    BODY PAIN    Recent Results (from the past 2160 hour(s))  Glucose, capillary     Status: Abnormal   Collection  Time: 02/11/16  9:51 AM  Result Value Ref Range   Glucose-Capillary 303 (H) 65 - 99 mg/dL  Glucose, capillary     Status: Abnormal   Collection Time: 02/11/16 11:03 AM  Result Value Ref Range   Glucose-Capillary 266 (H) 65 - 99 mg/dL  Glucose, capillary     Status: Abnormal   Collection Time: 02/11/16  3:33 PM  Result Value Ref Range   Glucose-Capillary 163 (H) 65 - 99 mg/dL  Glucose, capillary     Status: Abnormal   Collection Time: 02/11/16  9:57 PM  Result Value Ref Range   Glucose-Capillary 369 (H) 65 - 99 mg/dL   Comment 1 Notify RN   Glucose, capillary     Status: Abnormal   Collection  Time: 02/12/16  1:59 AM  Result Value Ref Range   Glucose-Capillary 453 (H) 65 - 99 mg/dL  Glucose, capillary     Status: Abnormal   Collection Time: 02/12/16  2:01 AM  Result Value Ref Range   Glucose-Capillary 427 (H) 65 - 99 mg/dL  Glucose, capillary     Status: Abnormal   Collection Time: 02/12/16  4:19 AM  Result Value Ref Range   Glucose-Capillary 452 (H) 65 - 99 mg/dL   Comment 1 Notify RN   Glucose, capillary     Status: Abnormal   Collection Time: 02/12/16  4:20 AM  Result Value Ref Range   Glucose-Capillary 432 (H) 65 - 99 mg/dL   Comment 1 Notify RN   CBC     Status: Abnormal   Collection Time: 02/12/16  5:08 AM  Result Value Ref Range   WBC 10.8 (H) 3.8 - 10.6 K/uL   RBC 3.57 (L) 4.40 - 5.90 MIL/uL   Hemoglobin 11.5 (L) 13.0 - 18.0 g/dL   HCT 33.8 (L) 40.0 - 52.0 %   MCV 94.8 80.0 - 100.0 fL   MCH 32.3 26.0 - 34.0 pg   MCHC 34.0 32.0 - 36.0 g/dL   RDW 13.2 11.5 - 14.5 %   Platelets 153 150 - 440 K/uL    Comment: COUNT MAY BE INACCURATE DUE TO FIBRIN CLUMPS.  Basic metabolic panel     Status: Abnormal   Collection Time: 02/12/16  5:08 AM  Result Value Ref Range   Sodium 129 (L) 135 - 145 mmol/L   Potassium 4.2 3.5 - 5.1 mmol/L   Chloride 97 (L) 101 - 111 mmol/L   CO2 22 22 - 32 mmol/L   Glucose, Bld 447 (H) 65 - 99 mg/dL   BUN 20 6 - 20 mg/dL   Creatinine, Ser 1.04 0.61 - 1.24 mg/dL   Calcium 7.7 (L) 8.9 - 10.3 mg/dL   GFR calc non Af Amer >60 >60 mL/min   GFR calc Af Amer >60 >60 mL/min    Comment: (NOTE) The eGFR has been calculated using the CKD EPI equation. This calculation has not been validated in all clinical situations. eGFR's persistently <60 mL/min signify possible Chronic Kidney Disease.    Anion gap 10 5 - 15  Hemoglobin A1c     Status: Abnormal   Collection Time: 02/12/16  5:08 AM  Result Value Ref Range   Hgb A1c MFr Bld 7.7 (H) 4.0 - 6.0 %  Glucose, capillary     Status: Abnormal   Collection Time: 02/12/16  7:58 AM  Result Value Ref  Range   Glucose-Capillary 403 (H) 65 - 99 mg/dL  Glucose, capillary     Status: Abnormal   Collection Time:  02/12/16 10:29 AM  Result Value Ref Range   Glucose-Capillary 312 (H) 65 - 99 mg/dL  Glucose, capillary     Status: Abnormal   Collection Time: 02/12/16 12:22 PM  Result Value Ref Range   Glucose-Capillary 247 (H) 65 - 99 mg/dL  Glucose, capillary     Status: Abnormal   Collection Time: 02/12/16  4:39 PM  Result Value Ref Range   Glucose-Capillary 178 (H) 65 - 99 mg/dL  Glucose, capillary     Status: Abnormal   Collection Time: 02/12/16  9:04 PM  Result Value Ref Range   Glucose-Capillary 222 (H) 65 - 99 mg/dL  CBC     Status: Abnormal   Collection Time: 02/13/16  6:01 AM  Result Value Ref Range   WBC 9.6 3.8 - 10.6 K/uL   RBC 3.28 (L) 4.40 - 5.90 MIL/uL   Hemoglobin 10.5 (L) 13.0 - 18.0 g/dL   HCT 30.9 (L) 40.0 - 52.0 %   MCV 94.4 80.0 - 100.0 fL   MCH 32.2 26.0 - 34.0 pg   MCHC 34.1 32.0 - 36.0 g/dL   RDW 13.0 11.5 - 14.5 %   Platelets 202 150 - 440 K/uL  Basic metabolic panel     Status: Abnormal   Collection Time: 02/13/16  6:01 AM  Result Value Ref Range   Sodium 131 (L) 135 - 145 mmol/L   Potassium 4.3 3.5 - 5.1 mmol/L   Chloride 97 (L) 101 - 111 mmol/L   CO2 27 22 - 32 mmol/L   Glucose, Bld 374 (H) 65 - 99 mg/dL   BUN 22 (H) 6 - 20 mg/dL   Creatinine, Ser 1.06 0.61 - 1.24 mg/dL   Calcium 7.9 (L) 8.9 - 10.3 mg/dL   GFR calc non Af Amer >60 >60 mL/min   GFR calc Af Amer >60 >60 mL/min    Comment: (NOTE) The eGFR has been calculated using the CKD EPI equation. This calculation has not been validated in all clinical situations. eGFR's persistently <60 mL/min signify possible Chronic Kidney Disease.    Anion gap 7 5 - 15  Glucose, capillary     Status: Abnormal   Collection Time: 02/13/16  7:37 AM  Result Value Ref Range   Glucose-Capillary 376 (H) 65 - 99 mg/dL   Comment 1 Notify RN   Glucose, capillary     Status: Abnormal   Collection Time: 02/13/16  11:44 AM  Result Value Ref Range   Glucose-Capillary 446 (H) 65 - 99 mg/dL   Comment 1 Notify RN   CBC     Status: Abnormal   Collection Time: 04/21/16 11:29 AM  Result Value Ref Range   WBC 6.6 3.8 - 10.6 K/uL   RBC 3.97 (L) 4.40 - 5.90 MIL/uL   Hemoglobin 11.9 (L) 13.0 - 18.0 g/dL   HCT 35.7 (L) 40.0 - 52.0 %   MCV 89.9 80.0 - 100.0 fL   MCH 29.8 26.0 - 34.0 pg   MCHC 33.2 32.0 - 36.0 g/dL   RDW 14.4 11.5 - 14.5 %   Platelets 271 150 - 440 K/uL  Basic metabolic panel     Status: Abnormal   Collection Time: 04/21/16 11:29 AM  Result Value Ref Range   Sodium 140 135 - 145 mmol/L   Potassium 4.0 3.5 - 5.1 mmol/L   Chloride 105 101 - 111 mmol/L   CO2 29 22 - 32 mmol/L   Glucose, Bld 102 (H) 65 - 99 mg/dL   BUN 21 (  H) 6 - 20 mg/dL   Creatinine, Ser 0.98 0.61 - 1.24 mg/dL   Calcium 9.2 8.9 - 10.3 mg/dL   GFR calc non Af Amer >60 >60 mL/min   GFR calc Af Amer >60 >60 mL/min    Comment: (NOTE) The eGFR has been calculated using the CKD EPI equation. This calculation has not been validated in all clinical situations. eGFR's persistently <60 mL/min signify possible Chronic Kidney Disease.    Anion gap 6 5 - 15  Glucose, capillary     Status: Abnormal   Collection Time: 04/24/16  8:59 AM  Result Value Ref Range   Glucose-Capillary 221 (H) 65 - 99 mg/dL  Glucose, capillary     Status: Abnormal   Collection Time: 04/24/16 10:18 AM  Result Value Ref Range   Glucose-Capillary 285 (H) 65 - 99 mg/dL    Objective: There were no vitals filed for this visit.  General: Patient is awake, alert, oriented x 3 and in no acute distress.  Dermatology: Skin is warm and dry bilateral with a partial thickness ulceration present  Left 2nd. Ulceration measures 0.3cm x 0.4cm x 0.2cm. There is a keratotic border with a 10:90 fibrogranular base. The ulceration does not probe to bone. There is no malodor, no active drainage, no erythema, no edema. No acute signs of infection.   Vascular: Dorsalis  Pedis pulse = 2/4 Bilateral,  Posterior Tibial pulse =1/4 Bilateral,  Capillary Fill Time < 5 seconds  Neurologic: Protective sensation diminished bilateral using the 5.07/10g [employer] Weinstein Monofilament.  Musculosketal: No Pain with palpation to ulcerated area. No pain with compression to calves bilateral. No gross bony deformities noted bilateral.  Assessment and Plan:  Problem List Items Addressed This Visit    None    Visit Diagnoses    Toe ulcer, left, limited to breakdown of skin (HCC)    -  Primary    Relevant Orders    WOUND CULTURE    Cellulitis and abscess of toe, left        Relevant Medications    amoxicillin-clavulanate (AUGMENTIN) 875-125 MG tablet    Other Relevant Orders    WOUND CULTURE    Type II diabetes mellitus with neurological manifestations ([place])        Relevant Orders    WOUND CULTURE    PVD (peripheral vascular disease) ([place])        Relevant Orders    WOUND CULTURE      -Examined patient and discussed the progression of the wound and treatment alternatives. - Excisionally dedbrided ulceration to healthy bleeding borders using a sterile tissue nipper. -Applied topical antibiotic cream and dry sterile dressing and instructed patient to continue with daily dressings at home consisting of the same -Wound culture obtained; will call patient if change in antibiotics is warranted  -Rx Augmentin for preventative measures -Recommend extra depth shoes to prevent rubbing or irritation at toe - Advised patient to go to the ER or return to office if the wound worsens or if constitutional symptoms are present. -Patient to return to office in 2-3 weeks for follow up care and evaluation or sooner if problems arise.  Landis Martins, DPM

## 2016-05-09 ENCOUNTER — Other Ambulatory Visit: Payer: Self-pay | Admitting: Sports Medicine

## 2016-05-09 ENCOUNTER — Telehealth: Payer: Self-pay | Admitting: Sports Medicine

## 2016-05-09 DIAGNOSIS — L03032 Cellulitis of left toe: Principal | ICD-10-CM

## 2016-05-09 DIAGNOSIS — L02612 Cutaneous abscess of left foot: Secondary | ICD-10-CM

## 2016-05-09 MED ORDER — SULFAMETHOXAZOLE-TRIMETHOPRIM 800-160 MG PO TABS: 1.0000 | ORAL_TABLET | Freq: Two times a day (BID) | ORAL | Status: DC

## 2016-05-09 NOTE — Telephone Encounter (Signed)
Wound culture results reviewed with patient. + for Enterbactor Cloacae. Patient to discontinue Augmentin and to start Bactrim as sent to pharmacy. 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 Patient asked me to document this in his chart. All questions answered. Patient to returned as scheduled for continued foot/ulceration care. -Dr. Cannon Kettle

## 2016-05-20 DIAGNOSIS — I208 Other forms of angina pectoris: Secondary | ICD-10-CM | POA: Diagnosis not present

## 2016-05-20 DIAGNOSIS — E782 Mixed hyperlipidemia: Secondary | ICD-10-CM | POA: Diagnosis not present

## 2016-05-20 DIAGNOSIS — Z6841 Body Mass Index (BMI) 40.0 and over, adult: Secondary | ICD-10-CM | POA: Diagnosis not present

## 2016-05-20 DIAGNOSIS — I10 Essential (primary) hypertension: Secondary | ICD-10-CM | POA: Diagnosis not present

## 2016-05-20 DIAGNOSIS — R600 Localized edema: Secondary | ICD-10-CM | POA: Diagnosis not present

## 2016-05-23 ENCOUNTER — Ambulatory Visit: Payer: Medicare Other | Admitting: Sports Medicine

## 2016-06-03 DIAGNOSIS — I208 Other forms of angina pectoris: Secondary | ICD-10-CM | POA: Diagnosis not present

## 2016-06-03 DIAGNOSIS — R079 Chest pain, unspecified: Secondary | ICD-10-CM | POA: Diagnosis not present

## 2016-06-03 DIAGNOSIS — I25118 Atherosclerotic heart disease of native coronary artery with other forms of angina pectoris: Secondary | ICD-10-CM | POA: Diagnosis not present

## 2016-06-06 ENCOUNTER — Ambulatory Visit: Payer: Medicare Other | Admitting: Sports Medicine

## 2016-06-11 ENCOUNTER — Ambulatory Visit
Admission: RE | Admit: 2016-06-11 | Discharge: 2016-06-11 | Disposition: A | Discharge status: Home / Self Care | Payer: Medicare Other | Source: Ambulatory Visit | Attending: Internal Medicine | Admitting: Internal Medicine

## 2016-06-11 ENCOUNTER — Encounter: Payer: Self-pay | Admitting: *Deleted

## 2016-06-11 ENCOUNTER — Encounter
Admission: RE | Disposition: A | Discharge status: Home / Self Care | Payer: Self-pay | Source: Ambulatory Visit | Attending: Internal Medicine

## 2016-06-11 DIAGNOSIS — E7800 Pure hypercholesterolemia, unspecified: Secondary | ICD-10-CM | POA: Insufficient documentation

## 2016-06-11 DIAGNOSIS — I252 Old myocardial infarction: Secondary | ICD-10-CM | POA: Insufficient documentation

## 2016-06-11 DIAGNOSIS — Z96652 Presence of left artificial knee joint: Secondary | ICD-10-CM | POA: Insufficient documentation

## 2016-06-11 DIAGNOSIS — G4730 Sleep apnea, unspecified: Secondary | ICD-10-CM | POA: Diagnosis not present

## 2016-06-11 DIAGNOSIS — Z9889 Other specified postprocedural states: Secondary | ICD-10-CM | POA: Diagnosis not present

## 2016-06-11 DIAGNOSIS — E119 Type 2 diabetes mellitus without complications: Secondary | ICD-10-CM | POA: Insufficient documentation

## 2016-06-11 DIAGNOSIS — Z79899 Other long term (current) drug therapy: Secondary | ICD-10-CM | POA: Insufficient documentation

## 2016-06-11 DIAGNOSIS — I2510 Atherosclerotic heart disease of native coronary artery without angina pectoris: Secondary | ICD-10-CM | POA: Diagnosis not present

## 2016-06-11 DIAGNOSIS — Z87891 Personal history of nicotine dependence: Secondary | ICD-10-CM | POA: Diagnosis not present

## 2016-06-11 DIAGNOSIS — R079 Chest pain, unspecified: Secondary | ICD-10-CM | POA: Diagnosis present

## 2016-06-11 DIAGNOSIS — I25110 Atherosclerotic heart disease of native coronary artery with unstable angina pectoris: Secondary | ICD-10-CM | POA: Diagnosis not present

## 2016-06-11 DIAGNOSIS — Z794 Long term (current) use of insulin: Secondary | ICD-10-CM | POA: Diagnosis not present

## 2016-06-11 DIAGNOSIS — Z8249 Family history of ischemic heart disease and other diseases of the circulatory system: Secondary | ICD-10-CM | POA: Diagnosis not present

## 2016-06-11 HISTORY — PX: CARDIAC CATHETERIZATION: SHX172

## 2016-06-11 SURGERY — LEFT HEART CATH AND CORONARY ANGIOGRAPHY
Anesthesia: Moderate Sedation

## 2016-06-11 MED ORDER — FENTANYL CITRATE (PF) 100 MCG/2ML IJ SOLN
INTRAMUSCULAR | Status: DC | PRN
  Administered 2016-06-11 (×2): 25 ug via INTRAVENOUS

## 2016-06-11 MED ORDER — SODIUM CHLORIDE 0.9 % IV SOLN: 250.0000 mL | INTRAVENOUS | Status: DC | PRN

## 2016-06-11 MED ORDER — SODIUM CHLORIDE 0.9 % WEIGHT BASED INFUSION: 1.0000 mL/kg/h | INTRAVENOUS | Status: DC

## 2016-06-11 MED ORDER — SODIUM CHLORIDE 0.9% FLUSH: 3.0000 mL | INTRAVENOUS | Status: DC | PRN

## 2016-06-11 MED ORDER — HEPARIN (PORCINE) IN NACL 2-0.9 UNIT/ML-% IJ SOLN
INTRAMUSCULAR | Status: AC
  Filled 2016-06-11: qty 1000

## 2016-06-11 MED ORDER — SODIUM CHLORIDE 0.9% FLUSH: 3.0000 mL | Freq: Two times a day (BID) | INTRAVENOUS | Status: DC

## 2016-06-11 MED ORDER — MIDAZOLAM HCL 2 MG/2ML IJ SOLN
INTRAMUSCULAR | Status: DC | PRN
  Administered 2016-06-11 (×2): 1 mg via INTRAVENOUS

## 2016-06-11 MED ORDER — IOPAMIDOL (ISOVUE-300) INJECTION 61%
INTRAVENOUS | Status: DC | PRN
  Administered 2016-06-11: 170 mL via INTRA_ARTERIAL

## 2016-06-11 MED ORDER — SODIUM CHLORIDE 0.9 % WEIGHT BASED INFUSION: 3.0000 mL/kg/h | INTRAVENOUS | Status: DC

## 2016-06-11 MED ORDER — ONDANSETRON HCL 4 MG/2ML IJ SOLN: 4.0000 mg | Freq: Four times a day (QID) | INTRAMUSCULAR | Status: DC | PRN

## 2016-06-11 MED ORDER — ACETAMINOPHEN 325 MG PO TABS: 650.0000 mg | ORAL_TABLET | ORAL | Status: DC | PRN

## 2016-06-11 MED ORDER — MIDAZOLAM HCL 2 MG/2ML IJ SOLN
INTRAMUSCULAR | Status: AC
  Filled 2016-06-11: qty 2

## 2016-06-11 MED ORDER — ASPIRIN 81 MG PO CHEW
81.0000 mg | CHEWABLE_TABLET | ORAL | Status: DC
Start: 2016-06-12 — End: 2016-06-11

## 2016-06-11 MED ORDER — FENTANYL CITRATE (PF) 100 MCG/2ML IJ SOLN
INTRAMUSCULAR | Status: AC
  Filled 2016-06-11: qty 2

## 2016-06-11 SURGICAL SUPPLY — 8 items
CATH INFINITI 5FR ANG PIGTAIL (CATHETERS) ×2 IMPLANT
CATH INFINITI 5FR JL4 (CATHETERS) ×2 IMPLANT
CATH INFINITI JR4 5F (CATHETERS) ×2 IMPLANT
KIT MANI 3VAL PERCEP (MISCELLANEOUS) ×2 IMPLANT
NEEDLE PERC 18GX7CM (NEEDLE) ×2 IMPLANT
PACK CARDIAC CATH (CUSTOM PROCEDURE TRAY) ×2 IMPLANT
SHEATH AVANTI 5FR X 11CM (SHEATH) ×2 IMPLANT
WIRE EMERALD 3MM-J .035X150CM (WIRE) ×2 IMPLANT

## 2016-06-11 NOTE — Discharge Instructions (Signed)

## 2016-06-13 ENCOUNTER — Ambulatory Visit (INDEPENDENT_AMBULATORY_CARE_PROVIDER_SITE_OTHER): Payer: Medicare Other | Admitting: Sports Medicine

## 2016-06-13 ENCOUNTER — Encounter: Payer: Self-pay | Admitting: Sports Medicine

## 2016-06-13 DIAGNOSIS — M79671 Pain in right foot: Secondary | ICD-10-CM

## 2016-06-13 DIAGNOSIS — L89891 Pressure ulcer of other site, stage 1: Secondary | ICD-10-CM | POA: Diagnosis not present

## 2016-06-13 DIAGNOSIS — M79672 Pain in left foot: Secondary | ICD-10-CM

## 2016-06-13 DIAGNOSIS — I739 Peripheral vascular disease, unspecified: Secondary | ICD-10-CM

## 2016-06-13 DIAGNOSIS — M79676 Pain in unspecified toe(s): Secondary | ICD-10-CM

## 2016-06-13 DIAGNOSIS — L97521 Non-pressure chronic ulcer of other part of left foot limited to breakdown of skin: Secondary | ICD-10-CM

## 2016-06-13 DIAGNOSIS — B351 Tinea unguium: Secondary | ICD-10-CM

## 2016-06-13 DIAGNOSIS — E1149 Type 2 diabetes mellitus with other diabetic neurological complication: Secondary | ICD-10-CM

## 2016-06-13 NOTE — Progress Notes (Signed)
Patient ID: MASSIE MEES, male   DOB: 20-May-1951, 65 y.o.   MRN: [id number] Subjective: Lawrence [name] is a 65 y.o. male patient seen in office for evaluation of ulceration of the left 2nd toe and also for routine nail trim. Patient has a history of diabetes and a blood glucose level today of 116 mg/dl.   Patient states that he finished antibiotics and has been dressing ulcer daily with antibiotic cream and bandaid. Completed Bactrim for + wound culture Enterbactor Cloacae. Denies nausea/fever/vomiting/chills/night sweats/shortness of breath/pain. Patient has no other pedal complaints at this time.  Admits that he has to have triple bypass surgery in the near future. Had cardiac cath with 95% blockage.   Patient Active Problem List   Diagnosis Date Noted  . Controlled type 2 diabetes mellitus without complication ([place]) 40/98/1191  . Combined fat and carbohydrate induced hyperlipemia 04/04/2016  . Myocardial infarction ([place]) 04/04/2016  . Apnea, sleep 04/04/2016  . H/O total knee replacement 03/28/2016  . S/P total knee arthroplasty 02/11/2016  . Acute sinusitis 11/06/2015  . Morbid (severe) obesity due to excess calories ([place]) 11/05/2015  . Arthritis of knee, degenerative 11/05/2015  . Hyperlipidemia 08/28/2015  . Type 2 diabetes mellitus ([place]) 08/28/2015  . CAD (coronary artery disease) 08/28/2015  . Lower extremity edema 08/28/2015  . GERD (gastroesophageal reflux disease) 08/28/2015  . OSA (obstructive sleep apnea) 08/28/2015  . Morbid obesity with BMI of 40.0-44.9, adult ([place]) 08/28/2015  . Edema leg 05/22/2015  . Benign essential HTN 05/18/2015   Current Outpatient Prescriptions on File Prior to Visit  Medication Sig Dispense Refill  . amoxicillin-clavulanate (AUGMENTIN) 875-125 MG tablet Take 1 tablet by mouth 2 (two) times daily. (Patient not taking: Reported on 06/04/2016) 28 tablet 0  . cetirizine (ZYRTEC) 10 MG tablet Take 10 mg by mouth daily.    . cholecalciferol (VITAMIN  D) 1000 units tablet Take 1,000 Units by mouth daily.    [name] docusate sodium (COLACE) 100 MG capsule Take 200 mg by mouth 2 (two) times daily.    . furosemide (LASIX) 20 MG tablet Take 20 mg by mouth 2 (two) times daily.     [name] glucagon (GLUCAGON EMERGENCY) 1 MG injection Inject 1 mg into the vein once as needed. Reported on 02/11/2016    . HYDROcodone-acetaminophen (NORCO) 5-325 MG tablet Take 1 tablet by mouth every 6 (six) hours as needed for moderate pain. (Patient not taking: Reported on 06/11/2016) 20 tablet 0  . insulin aspart (NOVOLOG) 100 UNIT/ML injection Inject 20 Units into the skin 3 (three) times daily. (Patient taking differently: Inject 20 Units into the skin 3 (three) times daily with meals. ) 10 mL 5  . insulin glargine (LANTUS) 100 UNIT/ML injection Inject 20 Units into the skin at bedtime.     . isosorbide mononitrate (IMDUR) 30 MG 24 hr tablet Take 15 mg by mouth daily.    [name] latanoprost (XALATAN) 0.005 % ophthalmic solution Place 1 drop into both eyes at bedtime.    . metoprolol tartrate (LOPRESSOR) 25 MG tablet Take 25 mg by mouth 2 (two) times daily.    . Multiple Vitamin (MULTIVITAMIN) capsule Take 1 capsule by mouth daily.    . Naproxen Sodium (ALEVE PO) Take 220-440 mg by mouth daily as needed (pain).     . Plant Sterols and Stanols (CHOLESTOFF PO) Take 2 capsules by mouth 2 (two) times daily.    . ranolazine (RANEXA) 1000 MG SR tablet TAKE 1000 MG TWICE A DAY    .  sulfamethoxazole-trimethoprim (BACTRIM DS,SEPTRA DS) 800-160 MG tablet Take 1 tablet by mouth 2 (two) times daily. Take this instead of Augmentin. + wound culture of Enterobacter. (Patient not taking: Reported on 06/04/2016) 14 tablet 0  . vitamin C (ASCORBIC ACID) 500 MG tablet Take 500 mg by mouth 2 (two) times daily.      No current facility-administered medications on file prior to visit.    Allergies  Allergen Reactions  . Nitroglycerin Nausea Only and Other (See Comments)    Patches only - headache  . Lipitor  [Atorvastatin] Other (See Comments) and Rash    BODY PAIN  . Statins Other (See Comments)    BODY PAIN BODY PAIN    Recent Results (from the past 2160 hour(s))  CBC     Status: Abnormal   Collection Time: 04/21/16 11:29 AM  Result Value Ref Range   WBC 6.6 3.8 - 10.6 K/uL   RBC 3.97 (L) 4.40 - 5.90 MIL/uL   Hemoglobin 11.9 (L) 13.0 - 18.0 g/dL   HCT 35.7 (L) 40.0 - 52.0 %   MCV 89.9 80.0 - 100.0 fL   MCH 29.8 26.0 - 34.0 pg   MCHC 33.2 32.0 - 36.0 g/dL   RDW 14.4 11.5 - 14.5 %   Platelets 271 150 - 440 K/uL  Basic metabolic panel     Status: Abnormal   Collection Time: 04/21/16 11:29 AM  Result Value Ref Range   Sodium 140 135 - 145 mmol/L   Potassium 4.0 3.5 - 5.1 mmol/L   Chloride 105 101 - 111 mmol/L   CO2 29 22 - 32 mmol/L   Glucose, Bld 102 (H) 65 - 99 mg/dL   BUN 21 (H) 6 - 20 mg/dL   Creatinine, Ser 0.98 0.61 - 1.24 mg/dL   Calcium 9.2 8.9 - 10.3 mg/dL   GFR calc non Af Amer >60 >60 mL/min   GFR calc Af Amer >60 >60 mL/min    Comment: (NOTE) The eGFR has been calculated using the CKD EPI equation. This calculation has not been validated in all clinical situations. eGFR's persistently <60 mL/min signify possible Chronic Kidney Disease.    Anion gap 6 5 - 15  Glucose, capillary     Status: Abnormal   Collection Time: 04/24/16  8:59 AM  Result Value Ref Range   Glucose-Capillary 221 (H) 65 - 99 mg/dL  Glucose, capillary     Status: Abnormal   Collection Time: 04/24/16 10:18 AM  Result Value Ref Range   Glucose-Capillary 285 (H) 65 - 99 mg/dL    Objective: There were no vitals filed for this visit.  General: Patient is awake, alert, oriented x 3 and in no acute distress.  Dermatology: Skin is warm and dry bilateral with a partial thickness ulceration present Left 2nd. Ulceration measures 0.2x0.3x0.2cm (last measurement 0.3cm x 0.4cm x 0.2cm). There is a keratotic border with a 10:90 fibrogranular base. The ulceration does not probe to bone. There is no  malodor, no active drainage, no erythema, no edema. No acute signs of infection.  Nails x 10 elongated and thickened consistent with mycosis.   Vascular: Dorsalis Pedis pulse = 2/4 Bilateral,  Posterior Tibial pulse =1/4 Bilateral,  Capillary Fill Time < 5 seconds. Edema controlled with compression stockings.  Neurologic: Protective sensation diminished bilateral using the 5.07/10g [employer].  Musculosketal: No Pain with palpation to ulcerated area. No pain with compression to calves bilateral. No gross bony deformities noted bilateral.  Assessment and Plan:  Problem List  Items Addressed This Visit    None    Visit Diagnoses    Toe ulcer, left, limited to breakdown of skin ([place])    -  Primary   Type II diabetes mellitus with neurological manifestations ([place])       PVD (peripheral vascular disease) ([place])       Dermatophytosis of nail       Foot pain, bilateral         -Examined patient  -Mechanically debrided nails x 10 using sterile nail nipper without incident -Re-Discussed the progression of the wound and treatment alternatives. - Excisionally dedbrided ulceration to healthy bleeding borders using a sterile tissue nipper. -Applied topical antibiotic cream and dry sterile dressing and instructed patient to continue with daily dressings at home consisting of the same -Recommend cont with extra depth shoes to prevent rubbing or irritation at toe - Advised patient to go to the ER or return to office if the wound worsens or if constitutional symptoms are present. -Patient to return to office in 3 weeks for follow up care left 2nd toe ulcer or sooner if problems arise.  Landis Martins, DPM

## 2016-06-16 DIAGNOSIS — I2510 Atherosclerotic heart disease of native coronary artery without angina pectoris: Secondary | ICD-10-CM | POA: Diagnosis not present

## 2016-06-16 DIAGNOSIS — I2583 Coronary atherosclerosis due to lipid rich plaque: Secondary | ICD-10-CM | POA: Diagnosis not present

## 2016-06-16 DIAGNOSIS — I252 Old myocardial infarction: Secondary | ICD-10-CM | POA: Diagnosis not present

## 2016-06-20 ENCOUNTER — Telehealth: Payer: Self-pay | Admitting: Primary Care

## 2016-06-20 NOTE — Telephone Encounter (Signed)
LM for pt to sch AWV, mn ° °

## 2016-06-23 ENCOUNTER — Emergency Department
Admission: EM | Admit: 2016-06-23 | Discharge: 2016-06-24 | Payer: Medicare Other | Attending: Emergency Medicine | Admitting: Emergency Medicine

## 2016-06-23 ENCOUNTER — Emergency Department: Payer: Medicare Other

## 2016-06-23 ENCOUNTER — Encounter: Payer: Self-pay | Admitting: Emergency Medicine

## 2016-06-23 DIAGNOSIS — I10 Essential (primary) hypertension: Secondary | ICD-10-CM | POA: Insufficient documentation

## 2016-06-23 DIAGNOSIS — Z87891 Personal history of nicotine dependence: Secondary | ICD-10-CM | POA: Insufficient documentation

## 2016-06-23 DIAGNOSIS — I200 Unstable angina: Secondary | ICD-10-CM | POA: Diagnosis not present

## 2016-06-23 DIAGNOSIS — J449 Chronic obstructive pulmonary disease, unspecified: Secondary | ICD-10-CM | POA: Insufficient documentation

## 2016-06-23 DIAGNOSIS — I252 Old myocardial infarction: Secondary | ICD-10-CM | POA: Insufficient documentation

## 2016-06-23 DIAGNOSIS — R079 Chest pain, unspecified: Secondary | ICD-10-CM

## 2016-06-23 DIAGNOSIS — R0602 Shortness of breath: Secondary | ICD-10-CM | POA: Diagnosis not present

## 2016-06-23 DIAGNOSIS — E119 Type 2 diabetes mellitus without complications: Secondary | ICD-10-CM | POA: Diagnosis not present

## 2016-06-23 DIAGNOSIS — Z79899 Other long term (current) drug therapy: Secondary | ICD-10-CM | POA: Diagnosis not present

## 2016-06-23 DIAGNOSIS — R0789 Other chest pain: Secondary | ICD-10-CM | POA: Diagnosis not present

## 2016-06-23 LAB — CBC WITH DIFFERENTIAL/PLATELET
BASOS ABS: 0 10*3/uL (ref 0–0.1)
BASOS PCT: 1 %
Eosinophils Absolute: 0.1 10*3/uL (ref 0–0.7)
Eosinophils Relative: 2 %
HEMATOCRIT: 36.3 % — AB (ref 40.0–52.0)
Hemoglobin: 12.3 g/dL — ABNORMAL LOW (ref 13.0–18.0)
Lymphocytes Relative: 19 %
Lymphs Abs: 1.7 10*3/uL (ref 1.0–3.6)
MCH: 30.9 pg (ref 26.0–34.0)
MCHC: 34 g/dL (ref 32.0–36.0)
MCV: 90.7 fL (ref 80.0–100.0)
MONO ABS: 0.6 10*3/uL (ref 0.2–1.0)
Monocytes Relative: 7 %
NEUTROS ABS: 6.3 10*3/uL (ref 1.4–6.5)
NEUTROS PCT: 71 %
Platelets: 244 10*3/uL (ref 150–440)
RBC: 4 MIL/uL — ABNORMAL LOW (ref 4.40–5.90)
RDW: 16.2 % — AB (ref 11.5–14.5)
WBC: 8.8 10*3/uL (ref 3.8–10.6)

## 2016-06-23 LAB — APTT: APTT: 32 s (ref 24–36)

## 2016-06-23 LAB — BASIC METABOLIC PANEL
ANION GAP: 9 (ref 5–15)
BUN: 21 mg/dL — ABNORMAL HIGH (ref 6–20)
CALCIUM: 8.5 mg/dL — AB (ref 8.9–10.3)
CHLORIDE: 103 mmol/L (ref 101–111)
CO2: 25 mmol/L (ref 22–32)
Creatinine, Ser: 1.14 mg/dL (ref 0.61–1.24)
GFR calc Af Amer: >60 mL/min (ref >60)
GFR calc non Af Amer: >60 mL/min (ref >60)
Glucose, Bld: 329 mg/dL — ABNORMAL HIGH (ref 65–99)
Potassium: 3.7 mmol/L (ref 3.5–5.1)
Sodium: 137 mmol/L (ref 135–145)

## 2016-06-23 LAB — GLUCOSE, CAPILLARY: Glucose-Capillary: 163 mg/dL — ABNORMAL HIGH (ref 65–99)

## 2016-06-23 LAB — PROTIME-INR
INR: 1.01
Prothrombin Time: 13.3 seconds (ref 11.4–15.2)

## 2016-06-23 LAB — TROPONIN I

## 2016-06-23 MED ORDER — HEPARIN BOLUS VIA INFUSION
4000.0000 [IU] | Freq: Once | INTRAVENOUS | Status: AC
  Administered 2016-06-23: 4000 [IU] via INTRAVENOUS
  Filled 2016-06-23: qty 4000

## 2016-06-23 MED ORDER — IOPAMIDOL (ISOVUE-370) INJECTION 76%
75.0000 mL | Freq: Once | INTRAVENOUS | Status: AC | PRN
  Administered 2016-06-23: 75 mL via INTRAVENOUS

## 2016-06-23 MED ORDER — MORPHINE SULFATE (PF) 2 MG/ML IV SOLN
2.0000 mg | Freq: Once | INTRAVENOUS | Status: DC
Start: 2016-06-23 — End: 2016-06-24
  Filled 2016-06-23: qty 1

## 2016-06-23 MED ORDER — HEPARIN (PORCINE) IN NACL 100-0.45 UNIT/ML-% IJ SOLN
1350.0000 [IU]/h | INTRAMUSCULAR | Status: DC
  Administered 2016-06-23: 1350 [IU]/h via INTRAVENOUS
  Filled 2016-06-23: qty 250

## 2016-06-23 NOTE — ED Triage Notes (Signed)
Pt presents to ED via EMS with c/o sudden onset of 10/10 chest pain with sob and bilateral arm numbness around 1830 this evening. Took a 325 mg aspirin at home. Upon EMS arrival pt reports pain had improved. Pt has significant cardiac hx and has multiple blockages currently. Pt followed by Dr. Nehemiah Massed and is scheduled for surgery on 9/1 at [facility]. Pt currently alert and calm with no increased work of breathing noted. Skin warm and dry.

## 2016-06-23 NOTE — ED Provider Notes (Addendum)
[facility] Emergency Department Provider Note  ____________________________________________   I have reviewed the triage vital signs and the nursing notes.   HISTORY  Chief Complaint Chest Pain    HPI Lawrence [name] is a 65 y.o. male with a history of ACS, but a recent catheter that showed multivessel disease, patient is a had stents because he states "my vessels are too small". Had a heart attack in 2008. Patient presents today complaining of left-sided chest pain that radiated to both arms. It was a heaviness. Associated with mild shortness of breath and nausea. No pleuritic nature to the pain. Denies diaphoresis. States it also went a little bit towards his back. It is very similar to multiple prior episodes of chest pain that he has had. His chest pain has started to become more frequent over the last month and he has not had it at rest prior to today. He states that this time his pain is a 1-2 out of 10 that he feels "much better". It began today at rest. Nothing made it better safe for the passage of time nothing made it worse. The patient states that he has had exertional chest pain as well. He is scheduled for a four-vessel heart bypass in [facility] on September 1. The pain was not at maximum intensity at onset did not feel tearing, it did have a component that went to his back which is somewhat atypical for him but the rest of it was identical to his prior angina.     Past Medical History:  Diagnosis Date  . Bruises easily   . CAD (coronary artery disease)    07/2007  . COPD, mild ([place])   . Diabetes ([place])   . Dizzy   . Elevated lipids   . Fatigue   . Glaucoma   . HBP (high blood pressure)   . Muscle pain   . Myocardial infarction ([place])    2008  . Neuropathy ([place])   . Night sweats   . Osteoarthritis   . Poor circulation   . Sinus complaint   . Skin cancer   . Sleep apnea     Patient Active Problem List   Diagnosis Date Noted  . Controlled type  2 diabetes mellitus without complication ([place]) 99991111  . Combined fat and carbohydrate induced hyperlipemia 04/04/2016  . Myocardial infarction ([place]) 04/04/2016  . Apnea, sleep 04/04/2016  . H/O total knee replacement 03/28/2016  . S/P total knee arthroplasty 02/11/2016  . Acute sinusitis 11/06/2015  . Morbid (severe) obesity due to excess calories ([place]) 11/05/2015  . Arthritis of knee, degenerative 11/05/2015  . Hyperlipidemia 08/28/2015  . Type 2 diabetes mellitus ([place]) 08/28/2015  . CAD (coronary artery disease) 08/28/2015  . Lower extremity edema 08/28/2015  . GERD (gastroesophageal reflux disease) 08/28/2015  . OSA (obstructive sleep apnea) 08/28/2015  . Morbid obesity with BMI of 40.0-44.9, adult ([place]) 08/28/2015  . Edema leg 05/22/2015  . Benign essential HTN 05/18/2015    Past Surgical History:  Procedure Laterality Date  . APPENDECTOMY    . CARDIAC CATHETERIZATION N/A 06/11/2016   Procedure: Left Heart Cath and Coronary Angiography;  Surgeon: Corey Skains, MD;  Location: [place] CV LAB;  Service: Cardiovascular;  Laterality: N/A;  . CARPAL TUNNEL RELEASE Left 04/24/2016   Procedure: CARPAL TUNNEL RELEASE;  Surgeon: Hessie Knows, MD;  Location: [facility] ORS;  Service: Orthopedics;  Laterality: Left;  . CATARACT EXTRACTION W/ INTRAOCULAR LENS  IMPLANT, BILATERAL Bilateral   . EYE  SURGERY Bilateral   . JOINT REPLACEMENT    . KNEE ARTHROPLASTY Left 02/11/2016   Procedure: COMPUTER ASSISTED TOTAL KNEE ARTHROPLASTY;  Surgeon: Dereck Leep, MD;  Location: [facility] ORS;  Service: Orthopedics;  Laterality: Left;  . KNEE ARTHROSCOPY Right     Prior to Admission medications   Medication Sig Start Date End Date Taking? Authorizing Provider  amoxicillin-clavulanate (AUGMENTIN) 875-125 MG tablet Take 1 tablet by mouth 2 (two) times daily. Patient not taking: Reported on 06/04/2016 05/02/16   Landis Martins, DPM  cetirizine (ZYRTEC) 10 MG tablet Take 10 mg by mouth daily.     Historical Provider, MD  cholecalciferol (VITAMIN D) 1000 units tablet Take 1,000 Units by mouth daily.    Historical Provider, MD  docusate sodium (COLACE) 100 MG capsule Take 200 mg by mouth 2 (two) times daily.    Historical Provider, MD  furosemide (LASIX) 20 MG tablet Take 20 mg by mouth 2 (two) times daily.  05/22/15 06/04/16  Historical Provider, MD  glucagon (GLUCAGON EMERGENCY) 1 MG injection Inject 1 mg into the vein once as needed. Reported on 02/11/2016    Historical Provider, MD  HYDROcodone-acetaminophen (NORCO) 5-325 MG tablet Take 1 tablet by mouth every 6 (six) hours as needed for moderate pain. Patient not taking: Reported on 06/11/2016 04/24/16   Hessie Knows, MD  insulin aspart (NOVOLOG) 100 UNIT/ML injection Inject 20 Units into the skin 3 (three) times daily. Patient taking differently: Inject 20 Units into the skin 3 (three) times daily with meals.  04/07/16   Pleas Koch, NP  insulin glargine (LANTUS) 100 UNIT/ML injection Inject 20 Units into the skin at bedtime.     Historical Provider, MD  isosorbide mononitrate (IMDUR) 30 MG 24 hr tablet Take 15 mg by mouth daily.    Historical Provider, MD  latanoprost (XALATAN) 0.005 % ophthalmic solution Place 1 drop into both eyes at bedtime.    Historical Provider, MD  metoprolol tartrate (LOPRESSOR) 25 MG tablet Take 25 mg by mouth 2 (two) times daily.    Historical Provider, MD  Multiple Vitamin (MULTIVITAMIN) capsule Take 1 capsule by mouth daily.    Historical Provider, MD  Naproxen Sodium (ALEVE PO) Take 220-440 mg by mouth daily as needed (pain).     Historical Provider, MD  Plant Sterols and Stanols (CHOLESTOFF PO) Take 2 capsules by mouth 2 (two) times daily.    Historical Provider, MD  ranolazine (RANEXA) 1000 MG SR tablet TAKE 1000 MG TWICE A DAY 02/28/15   Historical Provider, MD  sulfamethoxazole-trimethoprim (BACTRIM DS,SEPTRA DS) 800-160 MG tablet Take 1 tablet by mouth 2 (two) times daily. Take this instead of Augmentin. +  wound culture of Enterobacter. Patient not taking: Reported on 06/04/2016 05/09/16   Landis Martins, DPM  vitamin C (ASCORBIC ACID) 500 MG tablet Take 500 mg by mouth 2 (two) times daily.     Historical Provider, MD    Allergies Nitroglycerin; Lipitor [atorvastatin]; and Statins  Family History  Problem Relation Age of Onset  . Hypertension Mother   . Lupus Mother   . Hypertension Father   . Diabetes Father   . Arthritis Maternal Grandmother   . Arthritis Maternal Grandfather   . Arthritis Paternal Grandmother   . Arthritis Paternal Grandfather     Social History Social History  Substance Use Topics  . Smoking status: Former Smoker    Packs/day: 1.00    Years: 16.00    Types: Cigarettes    Start date: 11/03/1969  Quit date: 07/22/1986  . Smokeless tobacco: Never Used  . Alcohol use 0.0 oz/week     Comment: OCCASIONALLY    Review of Systems Constitutional: No fever/chills Eyes: No visual changes. ENT: No sore throat. No stiff neck no neck pain Cardiovascular: Positive chest pain. Respiratory: See history of present illness regarding shortness of breath. Gastrointestinal:   no vomiting.  No diarrhea.  No constipation. Genitourinary: Negative for dysuria. Musculoskeletal: Negative lower extremity swelling Skin: Negative for rash. Neurological: Negative for severe headaches, focal weakness or numbness. 10-point ROS otherwise negative.  ____________________________________________   PHYSICAL EXAM:  VITAL SIGNS: ED Triage Vitals  Enc Vitals Group     BP 06/23/16 1949 (!) 147/55     Pulse Rate 06/23/16 1949 72     Resp 06/23/16 1949 18     Temp 06/23/16 1949 98.4 F (36.9 C)     Temp Source 06/23/16 1949 Oral     SpO2 06/23/16 1949 100 %     Weight 06/23/16 1950 290 lb (131.5 kg)     Height 06/23/16 1950 5\' 11"  (1.803 m)     Head Circumference --      Peak Flow --      Pain Score 06/23/16 1951 3     Pain Loc --      Pain Edu? --      Excl. in [place]? --      Constitutional: Alert and oriented. Well appearing and in no acute distress. Eyes: Conjunctivae are normal. PERRL. EOMI. Head: Atraumatic. Nose: No congestion/rhinnorhea. Mouth/Throat: Mucous membranes are moist.  Oropharynx non-erythematous. Neck: No stridor.   Nontender with no meningismus Cardiovascular: Normal rate, regular rhythm. Grossly normal heart sounds.  Good peripheral circulation. Respiratory: Normal respiratory effort.  No retractions. Lungs CTAB. Abdominal: Soft and nontender. No distention. No guarding no rebound Back:  There is no focal tenderness or step off.  there is no midline tenderness there are no lesions noted. there is no CVA tenderness Musculoskeletal: No lower extremity tenderness, no upper extremity tenderness. No joint effusions, no DVT signs strong distal pulses no edema Neurologic:  Normal speech and language. No gross focal neurologic deficits are appreciated.  Skin:  Skin is warm, dry and intact. No rash noted. Psychiatric: Mood and affect are normal. Speech and behavior are normal.  ____________________________________________   LABS (all labs ordered are listed, but only abnormal results are displayed)  Labs Reviewed  CBC WITH DIFFERENTIAL/PLATELET  PROTIME-INR  TROPONIN I  BASIC METABOLIC PANEL  APTT   ____________________________________________  EKG  I personally interpreted any EKGs ordered by me or triage Normal sinus rhythm at 75 bpm no acute ST elevation or acute ST depression, borderline LAD noted, no acute ischemic changes ____________________________________________  RADIOLOGY  I reviewed any imaging ordered by me or triage that were performed during my shift and, if possible, patient and/or family made aware of any abnormal findings. ____________________________________________   PROCEDURES  Procedure(s) performed: None  Procedures  Critical Care performed:  None  ____________________________________________   INITIAL IMPRESSION / ASSESSMENT AND PLAN / ED COURSE  Pertinent labs & imaging results that were available during my care of the patient were reviewed by me and considered in my medical decision making (see chart for details).  Patient did receive aspirin prior to coming in. His chest pain was initially 1 out of 10, I did offer him morphine for this and he refused because by the time we drew it up, he was pain free. He has no symptoms  at this time. Do not suspect PE. Low suspicion for dissection, this seems and most respect to be very similar to the patient's chronic recurrent angina. The patient states there was some component of one to the back which is somewhat atypical for him but was not tearing, it was not maximal at onset and initial sensation, and all the other components of the chest pain were very similar to multiple different episodes he has had in the past. Mediastinal silhouette is reassuring. Patient is not markedly hypertensive. We are waiting the patient's blood work and we will discuss with cardiology. As he is asymptomatic at this time we do have a few minutes to determine the next step for him. As noted, he is scheduled for an outpatient bypass in September. This seems to represent however progression of his angina he likely will need to be anticoagulated and admitted.  ----------------------------------------- 9:54 PM on 06/23/2016 -----------------------------------------  Troponin negative as anticipated pain started at 7:00 this is reassuring EKG is reassuring significant disease on last However. The more I talk to the patient the more he has begun to emphasize that the thing that was different today is that the pain shot towards his back. While I documented above and I still feel there is low risk for dissection, obviously I am disinclined to anticoagulate the patient pending further imaging. We'll obtain a CT scan. Patient  is asymptomatic at this time.   Clinical Course  ----------------------------------------- 11:22 PM on 06/23/2016 -----------------------------------------  Patient remains asymptomatic here. We are starting heparin drip as indicated. Discussed with Dr. Nehemiah Massed, who feels the patient should be sent over to [facility]. Unfortunately, I called [facility] and while they would accept the patient they are on a complete diversion and they have placed the patient on a waiting list.   The patient, however, is unsure if he wishes to stay in this facility. He is very eager to be in a place where he can get the definitive bypass that he has been told that he needs. He states that he would rather go to [facility] than spend the night in the [facility] system awaiting transfer. I also discussed possible transfer to the [place] [facility] and he would prefer to go to [facility]. Given that this is the patient's request, and given that he has significant multivessel disease that seems to be essentially amenable principally to bypass,  we are making phone calls to see if we can effectuate this transfer.  ----------------------------------------- 12:09 AM on 06/24/2016 -----------------------------------------  D/w Dr. Minus Breeding, who states that he is happy except the patient at [facility] and does not have any further interventions that he would like to have however he didn't wish me to check again with Dr. Erin Fulling to make sure that he was okay with this plan. I therefore d/w dr. Erin Fulling again, who states that whenever the patient's preference is would be except told him. He does feel however that the patient could be medically managed at [facility] pending transfer if he decompensates or possible stenting to get him through an emergency. Therefore, I again discussed all the options with the patient including staying here and the information that his cardiologist provided about  his confidence that the patient can be managed here, versus going to a facility with a cardiothoracic surgeon. The patient is a strong preference to be where there is a Scientific laboratory technician. He states he was "scared" by the chest pain he was having today  and he absolutely does not wish to stay anywhere that does not have the option to do a cardiac bypass if required. I feel patient understands risks benefits and alternatives of transfer and this is his very strong choice in particular, patient wishes to be transferred to [facility]. Given the very strong preference of the patient to be transferred out and the compelling medical case for this, we will transfer him at his request to [facility]. Signed out to Dr. Dahlia Client at the end of my shift.   ____________________________________________   FINAL CLINICAL IMPRESSION(S) / ED DIAGNOSES  Final diagnoses:  Chest pain      This chart was dictated using voice recognition software.  Despite best efforts to proofread,  errors can occur which can change meaning.      Schuyler Amor, MD 06/23/16 2103    Schuyler Amor, MD 06/23/16 [place], MD 06/23/16 [place], MD 06/24/16 [id number]    Schuyler Amor, MD 06/24/16 [id number]

## 2016-06-24 ENCOUNTER — Inpatient Hospital Stay (HOSPITAL_COMMUNITY)
Admission: AD | Admit: 2016-06-24 | Discharge: 2016-07-03 | DRG: 236 | Disposition: A | Discharge status: Home / Self Care | Payer: Medicare Other | Source: Other Acute Inpatient Hospital | Attending: Cardiothoracic Surgery | Admitting: Cardiothoracic Surgery

## 2016-06-24 ENCOUNTER — Other Ambulatory Visit: Payer: Self-pay | Admitting: *Deleted

## 2016-06-24 ENCOUNTER — Encounter (HOSPITAL_COMMUNITY): Payer: Self-pay | Admitting: Cardiology

## 2016-06-24 DIAGNOSIS — I25110 Atherosclerotic heart disease of native coronary artery with unstable angina pectoris: Secondary | ICD-10-CM | POA: Diagnosis not present

## 2016-06-24 DIAGNOSIS — E785 Hyperlipidemia, unspecified: Secondary | ICD-10-CM | POA: Diagnosis present

## 2016-06-24 DIAGNOSIS — I25111 Atherosclerotic heart disease of native coronary artery with angina pectoris with documented spasm: Secondary | ICD-10-CM | POA: Diagnosis not present

## 2016-06-24 DIAGNOSIS — Z951 Presence of aortocoronary bypass graft: Secondary | ICD-10-CM | POA: Diagnosis not present

## 2016-06-24 DIAGNOSIS — I200 Unstable angina: Secondary | ICD-10-CM | POA: Diagnosis not present

## 2016-06-24 DIAGNOSIS — E1142 Type 2 diabetes mellitus with diabetic polyneuropathy: Secondary | ICD-10-CM | POA: Diagnosis present

## 2016-06-24 DIAGNOSIS — I4891 Unspecified atrial fibrillation: Secondary | ICD-10-CM | POA: Diagnosis not present

## 2016-06-24 DIAGNOSIS — R079 Chest pain, unspecified: Secondary | ICD-10-CM | POA: Diagnosis not present

## 2016-06-24 DIAGNOSIS — E1165 Type 2 diabetes mellitus with hyperglycemia: Secondary | ICD-10-CM | POA: Diagnosis present

## 2016-06-24 DIAGNOSIS — Z6841 Body Mass Index (BMI) 40.0 and over, adult: Secondary | ICD-10-CM | POA: Diagnosis not present

## 2016-06-24 DIAGNOSIS — I2584 Coronary atherosclerosis due to calcified coronary lesion: Secondary | ICD-10-CM | POA: Diagnosis present

## 2016-06-24 DIAGNOSIS — J449 Chronic obstructive pulmonary disease, unspecified: Secondary | ICD-10-CM | POA: Diagnosis not present

## 2016-06-24 DIAGNOSIS — Z8249 Family history of ischemic heart disease and other diseases of the circulatory system: Secondary | ICD-10-CM | POA: Diagnosis not present

## 2016-06-24 DIAGNOSIS — Z87891 Personal history of nicotine dependence: Secondary | ICD-10-CM | POA: Diagnosis not present

## 2016-06-24 DIAGNOSIS — H409 Unspecified glaucoma: Secondary | ICD-10-CM | POA: Diagnosis present

## 2016-06-24 DIAGNOSIS — Z794 Long term (current) use of insulin: Secondary | ICD-10-CM | POA: Diagnosis not present

## 2016-06-24 DIAGNOSIS — Z96652 Presence of left artificial knee joint: Secondary | ICD-10-CM | POA: Diagnosis present

## 2016-06-24 DIAGNOSIS — G4730 Sleep apnea, unspecified: Secondary | ICD-10-CM | POA: Diagnosis not present

## 2016-06-24 DIAGNOSIS — J9811 Atelectasis: Secondary | ICD-10-CM | POA: Diagnosis not present

## 2016-06-24 DIAGNOSIS — Z888 Allergy status to other drugs, medicaments and biological substances status: Secondary | ICD-10-CM | POA: Diagnosis not present

## 2016-06-24 DIAGNOSIS — I252 Old myocardial infarction: Secondary | ICD-10-CM

## 2016-06-24 DIAGNOSIS — I872 Venous insufficiency (chronic) (peripheral): Secondary | ICD-10-CM | POA: Diagnosis not present

## 2016-06-24 DIAGNOSIS — D62 Acute posthemorrhagic anemia: Secondary | ICD-10-CM | POA: Diagnosis not present

## 2016-06-24 DIAGNOSIS — I080 Rheumatic disorders of both mitral and aortic valves: Secondary | ICD-10-CM | POA: Diagnosis not present

## 2016-06-24 DIAGNOSIS — E8770 Fluid overload, unspecified: Secondary | ICD-10-CM | POA: Diagnosis not present

## 2016-06-24 DIAGNOSIS — I2510 Atherosclerotic heart disease of native coronary artery without angina pectoris: Secondary | ICD-10-CM | POA: Diagnosis not present

## 2016-06-24 DIAGNOSIS — R9431 Abnormal electrocardiogram [ECG] [EKG]: Secondary | ICD-10-CM | POA: Diagnosis not present

## 2016-06-24 DIAGNOSIS — R072 Precordial pain: Secondary | ICD-10-CM | POA: Diagnosis not present

## 2016-06-24 DIAGNOSIS — Z01810 Encounter for preprocedural cardiovascular examination: Secondary | ICD-10-CM | POA: Diagnosis not present

## 2016-06-24 DIAGNOSIS — J90 Pleural effusion, not elsewhere classified: Secondary | ICD-10-CM | POA: Diagnosis not present

## 2016-06-24 LAB — BASIC METABOLIC PANEL
ANION GAP: 10 (ref 5–15)
BUN: 17 mg/dL (ref 6–20)
CALCIUM: 8.9 mg/dL (ref 8.9–10.3)
CO2: 25 mmol/L (ref 22–32)
Chloride: 99 mmol/L — ABNORMAL LOW (ref 101–111)
Creatinine, Ser: 1.14 mg/dL (ref 0.61–1.24)
GLUCOSE: 417 mg/dL — AB (ref 65–99)
POTASSIUM: 4.4 mmol/L (ref 3.5–5.1)
Sodium: 134 mmol/L — ABNORMAL LOW (ref 135–145)

## 2016-06-24 LAB — LIPID PANEL
CHOLESTEROL: 198 mg/dL (ref 0–200)
HDL: 77 mg/dL (ref >40)
LDL CALC: 110 mg/dL — AB (ref 0–99)
TRIGLYCERIDES: 55 mg/dL (ref <150)
Total CHOL/HDL Ratio: 2.6 RATIO
VLDL: 11 mg/dL (ref 0–40)

## 2016-06-24 LAB — CBC
HEMATOCRIT: 38.9 % — AB (ref 39.0–52.0)
HEMOGLOBIN: 12.8 g/dL — AB (ref 13.0–17.0)
MCH: 30.3 pg (ref 26.0–34.0)
MCHC: 32.9 g/dL (ref 30.0–36.0)
MCV: 92 fL (ref 78.0–100.0)
Platelets: 258 10*3/uL (ref 150–400)
RBC: 4.23 MIL/uL (ref 4.22–5.81)
RDW: 15.2 % (ref 11.5–15.5)
WBC: 7.9 10*3/uL (ref 4.0–10.5)

## 2016-06-24 LAB — MRSA PCR SCREENING: MRSA BY PCR: NEGATIVE

## 2016-06-24 LAB — GLUCOSE, CAPILLARY
GLUCOSE-CAPILLARY: 320 mg/dL — AB (ref 65–99)
GLUCOSE-CAPILLARY: 485 mg/dL — AB (ref 65–99)
GLUCOSE-CAPILLARY: 77 mg/dL (ref 65–99)
Glucose-Capillary: 434 mg/dL — ABNORMAL HIGH (ref 65–99)
Glucose-Capillary: 234 mg/dL — ABNORMAL HIGH (ref 65–99)
Glucose-Capillary: 149 mg/dL — ABNORMAL HIGH (ref 65–99)

## 2016-06-24 LAB — TROPONIN I
TROPONIN I: 0.18 ng/mL — AB (ref <0.03)
Troponin I: 0.25 ng/mL (ref <0.03)
Troponin I: <0.03 ng/mL (ref <0.03)

## 2016-06-24 LAB — HEPARIN LEVEL (UNFRACTIONATED)
HEPARIN UNFRACTIONATED: 0.31 [IU]/mL (ref 0.30–0.70)
Heparin Unfractionated: 0.4 IU/mL (ref 0.30–0.70)

## 2016-06-24 MED ORDER — INSULIN ASPART 100 UNIT/ML ~~LOC~~ SOLN
20.0000 [IU] | Freq: Three times a day (TID) | SUBCUTANEOUS | Status: DC
  Administered 2016-06-24 – 2016-06-26 (×9): 20 [IU] via SUBCUTANEOUS

## 2016-06-24 MED ORDER — HEPARIN (PORCINE) IN NACL 100-0.45 UNIT/ML-% IJ SOLN
1400.0000 [IU]/h | INTRAMUSCULAR | Status: DC
  Administered 2016-06-24 – 2016-06-27 (×5): 1400 [IU]/h via INTRAVENOUS
  Filled 2016-06-24 (×4): qty 250

## 2016-06-24 MED ORDER — LATANOPROST 0.005 % OP SOLN
1.0000 [drp] | Freq: Every day | OPHTHALMIC | Status: DC
  Administered 2016-06-24 – 2016-07-02 (×9): 1 [drp] via OPHTHALMIC
  Filled 2016-06-24 (×4): qty 2.5

## 2016-06-24 MED ORDER — ACETAMINOPHEN 325 MG PO TABS: 650.0000 mg | ORAL_TABLET | ORAL | Status: DC | PRN

## 2016-06-24 MED ORDER — INSULIN GLARGINE 100 UNIT/ML ~~LOC~~ SOLN
30.0000 [IU] | Freq: Two times a day (BID) | SUBCUTANEOUS | Status: DC
  Administered 2016-06-24 – 2016-06-26 (×5): 30 [IU] via SUBCUTANEOUS
  Filled 2016-06-24 (×8): qty 0.3

## 2016-06-24 MED ORDER — ISOSORBIDE MONONITRATE ER 30 MG PO TB24
15.0000 mg | ORAL_TABLET | Freq: Every day | ORAL | Status: DC
  Administered 2016-06-24 – 2016-06-26 (×3): 15 mg via ORAL
  Filled 2016-06-24 (×3): qty 1

## 2016-06-24 MED ORDER — NITROGLYCERIN 0.4 MG SL SUBL: 0.4000 mg | SUBLINGUAL_TABLET | SUBLINGUAL | Status: DC | PRN

## 2016-06-24 MED ORDER — ASPIRIN EC 81 MG PO TBEC
81.0000 mg | DELAYED_RELEASE_TABLET | Freq: Every day | ORAL | Status: DC
  Administered 2016-06-25 – 2016-06-26 (×2): 81 mg via ORAL
  Filled 2016-06-24 (×2): qty 1

## 2016-06-24 MED ORDER — VITAMIN D 1000 UNITS PO TABS
1000.0000 [IU] | ORAL_TABLET | Freq: Every day | ORAL | Status: DC
  Administered 2016-06-24 – 2016-06-26 (×3): 1000 [IU] via ORAL
  Filled 2016-06-24 (×3): qty 1

## 2016-06-24 MED ORDER — METOPROLOL TARTRATE 25 MG PO TABS
25.0000 mg | ORAL_TABLET | Freq: Two times a day (BID) | ORAL | Status: DC
  Administered 2016-06-24 – 2016-06-26 (×6): 25 mg via ORAL
  Filled 2016-06-24 (×6): qty 1

## 2016-06-24 MED ORDER — ONDANSETRON HCL 4 MG/2ML IJ SOLN: 4.0000 mg | Freq: Four times a day (QID) | INTRAMUSCULAR | Status: DC | PRN

## 2016-06-24 MED ORDER — INSULIN ASPART 100 UNIT/ML ~~LOC~~ SOLN
0.0000 [IU] | Freq: Three times a day (TID) | SUBCUTANEOUS | Status: DC
  Administered 2016-06-24: 7 [IU] via SUBCUTANEOUS
  Administered 2016-06-24: 3 [IU] via SUBCUTANEOUS
  Administered 2016-06-24: 20 [IU] via SUBCUTANEOUS
  Administered 2016-06-25: 11 [IU] via SUBCUTANEOUS
  Administered 2016-06-25 – 2016-06-26 (×2): 15 [IU] via SUBCUTANEOUS

## 2016-06-24 MED ORDER — ROSUVASTATIN CALCIUM 10 MG PO TABS
20.0000 mg | ORAL_TABLET | Freq: Every day | ORAL | Status: DC
  Administered 2016-06-24 – 2016-07-02 (×8): 20 mg via ORAL
  Filled 2016-06-24: qty 1
  Filled 2016-06-24 (×5): qty 2
  Filled 2016-06-24 (×2): qty 1

## 2016-06-24 MED ORDER — HEPARIN BOLUS VIA INFUSION: 4000.0000 [IU] | Freq: Once | INTRAVENOUS | Status: DC

## 2016-06-24 MED ORDER — DOCUSATE SODIUM 100 MG PO CAPS
200.0000 mg | ORAL_CAPSULE | Freq: Two times a day (BID) | ORAL | Status: DC
  Administered 2016-06-24 – 2016-06-26 (×6): 200 mg via ORAL
  Filled 2016-06-24 (×6): qty 2

## 2016-06-24 MED ORDER — FUROSEMIDE 20 MG PO TABS
20.0000 mg | ORAL_TABLET | Freq: Two times a day (BID) | ORAL | Status: DC
  Administered 2016-06-24 – 2016-06-26 (×6): 20 mg via ORAL
  Filled 2016-06-24 (×6): qty 1

## 2016-06-24 MED ORDER — INSULIN GLARGINE 100 UNIT/ML ~~LOC~~ SOLN
20.0000 [IU] | Freq: Every day | SUBCUTANEOUS | Status: DC
  Administered 2016-06-24: 20 [IU] via SUBCUTANEOUS
  Filled 2016-06-24 (×2): qty 0.2

## 2016-06-24 MED ORDER — ADULT MULTIVITAMIN W/MINERALS CH
1.0000 | ORAL_TABLET | Freq: Every day | ORAL | Status: DC
  Administered 2016-06-24 – 2016-06-26 (×3): 1 via ORAL
  Filled 2016-06-24 (×3): qty 1

## 2016-06-24 NOTE — ED Notes (Signed)
Antonieta Pert, RN 2nd nurse verified heparin

## 2016-06-24 NOTE — Progress Notes (Signed)
Primary Cardiologist: Dr. Melvern Sample [facility])  Patient Profile: 65 y/o male followed by Dr. 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 He reports an MI at that time and after cath he was managed medically.  However, he had recurrent chest pain in July of this year.  This was similar to his previous MI.  He saw Dr. Nehemiah Massed and had an abnormal perfusion study with anterolateral, lateral defects with infarct and ischemia.  Cath was done and it was decided that he would have CABG at [facility] electively.  The EF on cath was 60%.  CABG is scheduled for Sept 1st. However patient presented to [facility] with severe recurrent CP. No beds available at [facility] thus patient transferred to [facility]. Patient seen by Dr. Percival Spanish overnight who has recommended CT consultation here at [facility]   Subjective: Chest pain free with IV heparin.   Objective: Vital signs in last 24 hours: Temp:  [98 F (36.7 C)-98.4 F (36.9 C)] 98 F (36.7 C) (08/22 0436) Pulse Rate:  [43-74] 74 (08/22 0436) Resp:  [15-28] 20 (08/22 0436) BP: (121-147)/(45-66) 142/66 (08/22 0436) SpO2:  [95 %-100 %] 100 % (08/22 0436) Weight:  [290 lb (131.5 kg)-291 lb 6.4 oz (132.2 kg)] 291 lb 6.4 oz (132.2 kg) (08/22 0202) Last BM Date: 06/24/16  Intake/Output from previous day: 08/21 0701 - 08/22 0700 In: 2.1 [I.V.:2.1] Out: -  Intake/Output this shift: No intake/output data recorded.  Medications Current Facility-Administered Medications  Medication Dose Route Frequency Provider Last Rate Last Dose  . acetaminophen (TYLENOL) tablet 650 mg  650 mg Oral Q4H PRN Minus Breeding, MD      . Derrill Memo ON 06/25/2016] aspirin EC tablet 81 mg  81 mg Oral Daily Minus Breeding, MD      . cholecalciferol (VITAMIN D) tablet 1,000 Units  1,000 Units Oral Daily Minus Breeding, MD   1,000 Units at 06/24/16 1037  . docusate sodium (COLACE) capsule 200 mg  200 mg Oral BID Minus Breeding, MD   200 mg at 06/24/16 1037  .  furosemide (LASIX) tablet 20 mg  20 mg Oral BID Minus Breeding, MD   20 mg at 06/24/16 [id number]  . heparin ADULT infusion 100 units/mL (25000 units/282mL sodium chloride 0.45%)  1,400 Units/hr Intravenous Continuous Erenest Blank, RPH 14 mL/hr at 06/24/16 0451 1,400 Units/hr at 06/24/16 0451  . insulin aspart (novoLOG) injection 0-20 Units  0-20 Units Subcutaneous TID WC Eileen Stanford, PA-C   20 Units at 06/24/16 [id number]  . insulin aspart (novoLOG) injection 20 Units  20 Units Subcutaneous TID WC Minus Breeding, MD   20 Units at 06/24/16 1140  . insulin glargine (LANTUS) injection 20 Units  20 Units Subcutaneous QHS Minus Breeding, MD   20 Units at 06/24/16 [phone]  . isosorbide mononitrate (IMDUR) 24 hr tablet 15 mg  15 mg Oral Daily Minus Breeding, MD   15 mg at 06/24/16 1037  . latanoprost (XALATAN) 0.005 % ophthalmic solution 1 drop  1 drop Both Eyes QHS Minus Breeding, MD      . metoprolol tartrate (LOPRESSOR) tablet 25 mg  25 mg Oral BID Minus Breeding, MD   25 mg at 06/24/16 1037  . multivitamin with minerals tablet 1 tablet  1 tablet Oral Daily Minus Breeding, MD   1 tablet at 06/24/16 1037  . nitroGLYCERIN (NITROSTAT) SL tablet 0.4 mg  0.4 mg Sublingual Q5 Min x 3 PRN Minus Breeding, MD      . ondansetron (  ZOFRAN) injection 4 mg  4 mg Intravenous Q6H PRN Minus Breeding, MD      . rosuvastatin (CRESTOR) tablet 20 mg  20 mg Oral q1800 Minus Breeding, MD        PE: General appearance: alert, cooperative and no distress, obese  Neck: no carotid bruit and no JVD Lungs: clear to auscultation bilaterally Heart: regular rate and rhythm, S1, S2 normal, no murmur, click, rub or gallop Extremities: no LEE Pulses: 2+ and symmetric Skin: warm and dry Neurologic: Grossly normal  Lab Results:   Recent Labs  06/23/16 1955 06/24/16 0416  WBC 8.8 7.9  HGB 12.3* 12.8*  HCT 36.3* 38.9*  PLT 244 258   BMET  Recent Labs  06/23/16 1955 06/24/16 0416  NA 137 134*  K 3.7 4.4  CL 103 99*  CO2  25 25  GLUCOSE 329* 417*  BUN 21* 17  CREATININE 1.14 1.14  CALCIUM 8.5* 8.9   PT/INR  Recent Labs  06/23/16 1955  LABPROT 13.3  INR 1.01   Cholesterol  Recent Labs  06/24/16 0416  CHOL 198    Studies/Results: Recent LHC at [facility] LAD-2 lesion, 95 %stenosed.  Dist LAD-1 lesion, 95 %stenosed.  Mid LAD lesion, 40 %stenosed.  Ost Cx lesion, 65 %stenosed.  1st Mrg lesion, 90 %stenosed.  2nd Mrg lesion, 90 %stenosed.  Dist Cx lesion, 80 %stenosed.  LM lesion, 70 %stenosed.  Prox LAD lesion, 70 %stenosed.  Prox RCA-2 lesion, 95 %stenosed.  Prox RCA-1 lesion, 65 %stenosed.  Mid RCA lesion, 45 %stenosed.  Dist RCA lesion, 60 %stenosed.   Assessment/Plan  Active Problems:   Unstable angina (HCC)   1. UNSTABLE ANGINA/ Severe 3V CAD:  The patient has diffuse disease. He has left main stenosis. Lesions are not particularly amenable to PCI. CABG is felt to be the best option. [facility] consult placed earlier today. He is CP free. Continue IV heparin.   2. DM:  A1c 7.7 02/2016. BG was as high as 450 earlier today but improving. Most recent POC glucose was 234. Continue Insulin.   3. DYSLIPIDEMIA:  Refused statins in the past but he is willing to try Crestor at this point.  Lipid panel showed LDL of 110 mg/dL. Crestor 20 mg initiated. Repeat FLP + HFTs in 6 weeks. LDL target <70 mg/dL.   4. SLEEP APNEA:  The patient wants to use his home CPAP    LOS: 0 days    Brittainy M. Ladoris Gene 06/24/2016 11:46 AM  Attending Note:   The patient was seen and examined.  Agree with assessment and plan as noted above.  Changes made to the above note as needed.  Patient seen and independently examined with Lyda [name], PA .   We discussed all aspects of the encounter. I agree with the assessment and plan as stated above.  Pt is feeling better.  Has known 3 V CAD and LM disease. TCTS to see patient today . He would like to have surgery here  instead of at [facility]    I have spent a total of 20 minutes with patient reviewing hospital  notes , telemetry, EKGs, labs and examining patient as well as establishing an assessment and plan that was discussed with the patient. > 50% of time was spent in direct patient care.    Thayer Headings, Brooke Bonito., MD, [facility] 06/24/2016, 12:29 PM [address]  [place] Pager [phone]

## 2016-06-24 NOTE — Progress Notes (Signed)
CRITICAL VALUE ALERT  Critical value received: Troponin   Date of notification:  06/24/16  Time of notification:  [id number]  Critical value read back:Yes  Nurse who received alert: Cato Mulligan RN   MD notified (1st page):  Dr. Mertie Moores   Time of first page:  1430  MD notified (2nd page): Office  Time of second page:1440  Responding MD:  Sharyne Peach [facility])  Time MD responded:  [phone]

## 2016-06-24 NOTE — Progress Notes (Signed)
Patient arrived via carelink from [facility], assessment completed see flowsheet, placed on tele,ccmd notified, patient oriented to room and staff, bed in lowest position, call light within reach will continue to monitor.

## 2016-06-24 NOTE — Progress Notes (Addendum)
CBG 485, patient was asymptomatic 20 units of novolog given( meal coverage) , MD notified order for sliding scal received, will continue to monitor

## 2016-06-24 NOTE — Progress Notes (Signed)
CBD 434, sliding scale Novolog 20 units given ,[place] PA notified, will continue to monitor and report to oncoming nurse

## 2016-06-24 NOTE — Progress Notes (Signed)
ANTICOAGULATION CONSULT NOTE - Initial Consult  Pharmacy Consult for heparin drip Indication: chest pain/ACS/STEMI  Allergies  Allergen Reactions  . Nitroglycerin Nausea Only and Other (See Comments)    Patches only - headache  . Lipitor [Atorvastatin] Other (See Comments) and Rash    BODY PAIN  . Statins Other (See Comments)    BODY PAIN BODY PAIN    Patient Measurements: Height: 5\' 11"  (180.3 cm) Weight: 290 lb (131.5 kg) IBW/kg (Calculated) : 75.3 Heparin Dosing Weight: 105kg  Vital Signs: Temp: 98.4 F (36.9 C) (08/21 1949) Temp Source: Oral (08/21 1949) BP: 130/46 (08/22 0015) Pulse Rate: 65 (08/22 0015)  Labs:  Recent Labs  06/23/16 1955  HGB 12.3*  HCT 36.3*  PLT 244  APTT 32  LABPROT 13.3  INR 1.01  CREATININE 1.14  TROPONINI <0.03    Estimated Creatinine Clearance: 87 mL/min (by C-G formula based on SCr of 1.14 mg/dL).   Medical History: Past Medical History:  Diagnosis Date  . Bruises easily   . CAD (coronary artery disease)    07/2007  . COPD, mild ([place])   . Diabetes ([place])   . Dizzy   . Elevated lipids   . Fatigue   . Glaucoma   . HBP (high blood pressure)   . Muscle pain   . Myocardial infarction ([place])    2008  . Neuropathy ([place])   . Night sweats   . Osteoarthritis   . Poor circulation   . Sinus complaint   . Skin cancer   . Sleep apnea     Medications:  No anticoagulation in current PTA meds.  Assessment:  Goal of Therapy:  Heparin level 0.3-0.7 units/ml Monitor platelets by anticoagulation protocol: Yes   Plan:  4000 unit bolus and initial rate of 1250 units/hr. First anti-Xa 6 hours after start of infusion.  Tresean Mattix S 06/24/2016,12:31 AM

## 2016-06-24 NOTE — Progress Notes (Signed)
Verbal order received from Hoc[place]in MD to give lantus 20 unit,since patient reported that he missed his bed time dose, will continue to monitor

## 2016-06-24 NOTE — Progress Notes (Addendum)
ANTICOAGULATION CONSULT NOTE - Initial Consult  Pharmacy Consult for Heparin  Indication: Severe three vessel disease awaiting CVTS consult  Allergies  Allergen Reactions  . Nitroglycerin Nausea Only and Other (See Comments)    Patches only - headache  . Lipitor [Atorvastatin] Other (See Comments) and Rash    BODY PAIN  . Statins Other (See Comments)    BODY PAIN BODY PAIN    Patient Measurements: Height: 5\' 11"  (180.3 cm) Weight: 291 lb 6.4 oz (132.2 kg) IBW/kg (Calculated) : 75.3  Vital Signs: Temp: 98 F (36.7 C) (08/22 0436) Temp Source: Oral (08/22 0436) BP: 142/66 (08/22 0436) Pulse Rate: 74 (08/22 0436)  Labs:  Recent Labs  06/23/16 1955 06/23/16 2350 06/24/16 0416  HGB 12.3*  --  12.8*  HCT 36.3*  --  38.9*  PLT 244  --  258  APTT 32  --   --   LABPROT 13.3  --   --   INR 1.01  --   --   CREATININE 1.14  --   --   TROPONINI <0.03 <0.03  --     Estimated Creatinine Clearance: 87.2 mL/min (by C-G formula based on SCr of 1.14 mg/dL).   Medical History: Past Medical History:  Diagnosis Date  . CAD (coronary artery disease)    07/2007  . Diabetes ([place])   . Elevated lipids   . Glaucoma   . HBP (high blood pressure)   . Neuropathy ([place])   . Osteoarthritis   . Skin cancer   . Sleep apnea    CPAP    Assessment: 65 y/o M with CP, was going to have elective CABG at [facility] in a few weeks, starting heparin, Hgb 12.8, heparin drip was turned off around 0300 this AM at time of transfer  Goal of Therapy:  Heparin level 0.3-0.7 units/ml Monitor platelets by anticoagulation protocol: Yes   Plan:  -Heparin 4000 units BOLUS already given at [facility] -Resume heparin drip at 1400 units/hr -1300 HL  Narda Bonds 06/24/2016,4:43 AM

## 2016-06-24 NOTE — Progress Notes (Signed)
[place] for Heparin  Indication: Severe three vessel disease awaiting CVTS consult  Allergies  Allergen Reactions  . Nitroglycerin Nausea Only and Other (See Comments)    Patches only - headache  . Lisinopril Other (See Comments)    Light headed, dizzy  . Lipitor [Atorvastatin] Other (See Comments) and Rash    BODY PAIN  . Statins Other (See Comments)    BODY PAIN BODY PAIN    Patient Measurements: Height: 5\' 11"  (180.3 cm) Weight: 291 lb 6.4 oz (132.2 kg) IBW/kg (Calculated) : 75.3  Vital Signs: Temp: 97.8 F (36.6 C) (08/22 1216) Temp Source: Oral (08/22 1216) BP: 139/78 (08/22 1216) Pulse Rate: 74 (08/22 1216)  Labs:  Recent Labs  06/23/16 1955 06/23/16 2350 06/24/16 0416 06/24/16 1310  HGB 12.3*  --  12.8*  --   HCT 36.3*  --  38.9*  --   PLT 244  --  258  --   APTT 32  --   --   --   LABPROT 13.3  --   --   --   INR 1.01  --   --   --   HEPARINUNFRC  --   --   --  0.31  CREATININE 1.14  --  1.14  --   TROPONINI <0.03 <0.03 <0.03  --     Estimated Creatinine Clearance: 87.2 mL/min (by C-G formula based on SCr of 1.14 mg/dL).   Medical History: Past Medical History:  Diagnosis Date  . CAD (coronary artery disease)    07/2007  . Diabetes ([place])   . Elevated lipids   . Glaucoma   . HBP (high blood pressure)   . Neuropathy ([place])   . Osteoarthritis   . Skin cancer   . Sleep apnea    CPAP    Assessment: 65 y/o M with CP, was going to have elective CABG at [facility] in a few weeks, starting heparin, Hgb 12.8, heparin drip was turned off around 0300 this AM at time of transfer.  HL is therapeutic at 0.31 on heparin 1400 units/hr. No issues with infusion or bleeding noted.  Goal of Therapy:  Heparin level 0.3-0.7 units/ml Monitor platelets by anticoagulation protocol: Yes   Plan:  Continue heparin 1400 units/hr 6h confirmatory HL Daily HL Monitor s/sx of bleeding  Rebecka Apley 06/24/2016,2:07 PM

## 2016-06-24 NOTE — Consult Note (Signed)
[address]       [place] [address]             [phone]        Blanton W Apperson Alva Medical Record [id number] Date of Birth: 03-18-1951  Referring: Dr Nehemiah Massed Primary Care: Sheral Flow, NP  Chief Complaint:   Chest pain  History of Present Illness:     Patient examined, coronary angiogram and CTA of chest personally reviewed and counseled with patient  65 year old morbidly obese diabetic reformed smoker with prior history of subendocardial MI in 2008 presents with to 3 weeks of progressive chest pain consistent with angina as well as dyspnea on exertion. A stress test was positive for ischemic changes and he underwent cardiac catheterization earlier this month by Dr. Nehemiah Massed. He was  found have three-vessel coronary disease with 70% left main stenosis and was felt be a candidate for surgical coronary revascularization. LV function appears to be preserved. The patient's chest pain became unstable and at rest and he was admitted to the hospital and transferred to this facility for cardiac surgery. The patient currently stable on IV heparin.   Current Activity/ Functional Status: Patient lives with his wife He is retired-he worked several years in [employer]. He is able ambulate fairly well without assistance   Zubrod Score: At the time of surgery this patient's most appropriate activity status/level should be described as: []     0    Normal activity, no symptoms []     1    Restricted in physical strenuous activity but ambulatory, able to do out light work [x]     2    Ambulatory and capable of self care, unable to do work activities, up and about                 more than 50%  Of the time                            []     3    Only limited self care, in bed greater than 50% of waking hours []     4    Completely disabled, no self care, confined to bed or chair []     5    Moribund  Past Medical History:  Diagnosis Date  . CAD  (coronary artery disease)    07/2007  . Diabetes ([place])   . Elevated lipids   . Glaucoma   . HBP (high blood pressure)   . Neuropathy ([place])   . Osteoarthritis   . Skin cancer   . Sleep apnea    CPAP    Past Surgical History:  Procedure Laterality Date  . APPENDECTOMY    . CARDIAC CATHETERIZATION N/A 06/11/2016   Procedure: Left Heart Cath and Coronary Angiography;  Surgeon: Corey Skains, MD;  Location: [facility];  Service: Cardiovascular;  Laterality: N/A;  . CARPAL TUNNEL RELEASE Left 04/24/2016   Procedure: CARPAL TUNNEL RELEASE;  Surgeon: Hessie Knows, MD;  Location: [facility];  Service: Orthopedics;  Laterality: Left;  . CATARACT EXTRACTION W/ INTRAOCULAR LENS  IMPLANT, BILATERAL Bilateral   . CATARACT EXTRACTION, BILATERAL    . EYE SURGERY Bilateral   . JOINT REPLACEMENT    . KNEE ARTHROPLASTY Left 02/11/2016   Procedure: COMPUTER ASSISTED TOTAL KNEE ARTHROPLASTY;  Surgeon: Dereck Leep, MD;  Location: [facility];  Service: Orthopedics;  Laterality: Left;  .  KNEE ARTHROSCOPY Right     History  Smoking Status  . Former Smoker  . Packs/day: 1.00  . Years: 16.00  . Types: Cigarettes  . Start date: 11/03/1969  . Quit date: 07/22/1986  Smokeless Tobacco  . Never Used    History  Alcohol Use  . 0.0 oz/week    Comment: OCCASIONALLY    Social History   Social History  . Marital status: Married    Spouse name: N/A  . Number of children: N/A  . Years of education: N/A   Occupational History  . Not on file.   Social History Main Topics  . Smoking status: Former Smoker    Packs/day: 1.00    Years: 16.00    Types: Cigarettes    Start date: 11/03/1969    Quit date: 07/22/1986  . Smokeless tobacco: Never Used  . Alcohol use 0.0 oz/week     Comment: OCCASIONALLY  . Drug use: No  . Sexual activity: Not on file   Other Topics Concern  . Not on file   Social History Narrative   Married.  Lives at home with wife.     Allergies  Allergen Reactions  .  Nitroglycerin Nausea Only and Other (See Comments)    Patches only - headache  . Lisinopril Other (See Comments)    Light headed, dizzy  . Lipitor [Atorvastatin] Other (See Comments) and Rash    BODY PAIN  . Statins Other (See Comments)    BODY PAIN BODY PAIN    Current Facility-Administered Medications  Medication Dose Route Frequency Provider Last Rate Last Dose  . acetaminophen (TYLENOL) tablet 650 mg  650 mg Oral Q4H PRN Minus Breeding, MD      . Derrill Memo ON 06/25/2016] aspirin EC tablet 81 mg  81 mg Oral Daily Minus Breeding, MD      . cholecalciferol (VITAMIN D) tablet 1,000 Units  1,000 Units Oral Daily Minus Breeding, MD   1,000 Units at 06/24/16 1037  . docusate sodium (COLACE) capsule 200 mg  200 mg Oral BID Minus Breeding, MD   200 mg at 06/24/16 1037  . furosemide (LASIX) tablet 20 mg  20 mg Oral BID Minus Breeding, MD   20 mg at 06/24/16 1719  . heparin ADULT infusion 100 units/mL (25000 units/2106mL sodium chloride 0.45%)  1,400 Units/hr Intravenous Continuous Erenest Blank, RPH 14 mL/hr at 06/24/16 1750 1,400 Units/hr at 06/24/16 1750  . insulin aspart (novoLOG) injection 0-20 Units  0-20 Units Subcutaneous TID WC Eileen Stanford, PA-C   3 Units at 06/24/16 1720  . insulin aspart (novoLOG) injection 20 Units  20 Units Subcutaneous TID WC Minus Breeding, MD   20 Units at 06/24/16 1720  . insulin glargine (LANTUS) injection 30 Units  30 Units Subcutaneous BID Ivin Poot, MD      . isosorbide mononitrate (IMDUR) 24 hr tablet 15 mg  15 mg Oral Daily Minus Breeding, MD   15 mg at 06/24/16 1037  . latanoprost (XALATAN) 0.005 % ophthalmic solution 1 drop  1 drop Both Eyes QHS Minus Breeding, MD      . metoprolol tartrate (LOPRESSOR) tablet 25 mg  25 mg Oral BID Minus Breeding, MD   25 mg at 06/24/16 1037  . multivitamin with minerals tablet 1 tablet  1 tablet Oral Daily Minus Breeding, MD   1 tablet at 06/24/16 1037  . nitroGLYCERIN (NITROSTAT) SL tablet 0.4 mg  0.4 mg  Sublingual Q5 Min x 3 PRN Minus Breeding, MD      .  ondansetron (ZOFRAN) injection 4 mg  4 mg Intravenous Q6H PRN Minus Breeding, MD      . rosuvastatin (CRESTOR) tablet 20 mg  20 mg Oral q1800 Minus Breeding, MD   20 mg at 06/24/16 1718    Prescriptions Prior to Admission  Medication Sig Dispense Refill Last Dose  . cetirizine (ZYRTEC) 10 MG tablet Take 10 mg by mouth daily.   06/23/2016 at Unknown time  . cholecalciferol (VITAMIN D) 1000 units tablet Take 1,000 Units by mouth daily.   06/23/2016 at Unknown time  . docusate sodium (COLACE) 100 MG capsule Take 200 mg by mouth 2 (two) times daily.   06/23/2016 at Unknown time  . furosemide (LASIX) 20 MG tablet Take 20 mg by mouth 2 (two) times daily.    06/23/2016 at Unknown time  . insulin aspart (NOVOLOG) 100 UNIT/ML injection Inject 20 Units into the skin 3 (three) times daily. (Patient taking differently: Inject 20 Units into the skin 3 (three) times daily with meals. ) 10 mL 5 06/23/2016 at Unknown time  . isosorbide mononitrate (IMDUR) 30 MG 24 hr tablet Take 15 mg by mouth daily.   06/23/2016 at Unknown time  . metoprolol tartrate (LOPRESSOR) 25 MG tablet Take 25 mg by mouth 2 (two) times daily.   06/23/2016 at 1800  . Multiple Vitamin (MULTIVITAMIN) capsule Take 1 capsule by mouth daily.   06/23/2016 at Unknown time  . Plant Sterols and Stanols (CHOLESTOFF PO) Take 2 capsules by mouth 2 (two) times daily.   06/23/2016 at Unknown time  . ranolazine (RANEXA) 1000 MG SR tablet TAKE 1000 MG TWICE A DAY   06/23/2016 at Unknown time  . vitamin C (ASCORBIC ACID) 500 MG tablet Take 500 mg by mouth 2 (two) times daily.    06/23/2016 at Unknown time  . glucagon (GLUCAGON EMERGENCY) 1 MG injection Inject 1 mg into the vein once as needed. Reported on 02/11/2016   Not Taking at Unknown time  . insulin glargine (LANTUS) 100 UNIT/ML injection Inject 20 Units into the skin at bedtime.    06/22/2016  . latanoprost (XALATAN) 0.005 % ophthalmic solution Place 1 drop  into both eyes at bedtime.   06/22/2016    Family History  Problem Relation Age of Onset  . Hypertension Mother   . Lupus Mother   . Hypertension Father   . Diabetes Father   . Heart attack Father 60    died in his 6s  . Arthritis Maternal Grandmother   . Arthritis Maternal Grandfather   . Arthritis Paternal Grandmother   . Arthritis Paternal Grandfather      Review of Systems:   The patient has poorly controlled diabetes with A1c 7.8 Earlier this year the patient had a left total knee replacement without anesthetic or cardiac complication The patient has sleep apnea on home CPAP The patient had a large tree limb fall on his left lower chest 20 years ago with some rib fractures The patient has chronic venous insufficiency of his left pretibial skin. He has history of left foot second toe indolent infection with Enterobacter earlier this summer which was treated with antibiotics -it is now a dry eschar The patient is right-hand dominant    Cardiac Review of Systems: Y or N  Chest Pain [  y  ]  Resting SOB [n   ] Exertional SyOB  [ y ]  Vertell Limber Florencio.Farrier  ]   Pedal Edema [  y ]    Palpitations [n  ]  Syncope  [ n ]   Presyncope [ n  ]  General Review of Systems: [Y] = yes [  ]=no Constitional: recent weight change [y-loss  ]; anorexia [  ]; fatigue [place].Blacker  ]; nausea [  ]; night sweats [  ]; fever [  ]; or chills [  ]                                                               Dental: poor dentition[  ]; Last Dentist visit: Recent dental Carrie filling placed  Eye : blurred vision [  ]; diplopia [   ]; vision changes [ yes previous laser surgery and cataract surgery ];  Amaurosis fugax[  ]; Resp: cough [  ];  wheezing[  ];  hemoptysis[  ]; shortness of breath[  ]; paroxysmal nocturnal dyspnea[  ]; dyspnea on exertion[ yes  ]; or orthopnea[  ];  GI:  gallstones[  ], vomiting[  ];  dysphagia[  ]; melena[  ];  hematochezia [  ]; heartburn[  ];   Hx of  Colonoscopy[ yes ]; old appendectomy   GU: kidney stones [  ]; hematuria[  ];   dysuria [  ];  nocturia[  ];  history of     obstruction [  ]; urinary frequency [  ]mild nocturia             Skin: rash, swelling[  ];, hair loss[  ];  peripheral edema[  ];  or itching[  ]; Musculosketetal: myalgias[  ];  joint swelling[  ];  joint erythema[  ];  joint pain[ yes left knee replacement  ];  back pain[  ];  Heme/Lymph: bruising[  ];  bleeding[  ];  anemia[  ];  Neuro: TIA[  ];  headaches[  ];  stroke[  ];  vertigo[  ];  seizures[  ];   paresthesias[  ];  difficulty walking[  ];  Psych:depression[  ]; anxiety[  ];  Endocrine: diabetes[ yes ];  thyroid dysfunction[  ];  Immunizations: Flu [  ]; Pneumococcal[  ];  Other:  Physical Exam: BP 139/78 (BP Location: Left Arm)   Pulse 74   Temp 97.8 F (36.6 C) (Oral)   Resp 19   Ht 5\' 11"  (1.803 m)   Wt 291 lb 6.4 oz (132.2 kg)   SpO2 100%   BMI 40.64 kg/m        Physical Exam  General: Pleasant morbidly obese Caucasian male no acute distress accompanied by wife in hospital room HEENT: Normocephalic pupils equal , dentition adequate Neck: Supple without JVD, adenopathy, or bruit Chest: Clear to auscultation, symmetrical breath sounds, no rhonchi, no tenderness             or deformity Cardiovascular: Regular rate and rhythm, no murmur, no gallop, peripheral pulses             palpable in all extremities Abdomen:  Soft, obese, nontender, no palpable mass or organomegaly Extremities: Warm, well-perfused, no clubbing cyanosis edema or tenderness,              2+  venous stasis changes of the left leg Rectal/GU: Deferred Neuro: Grossly non--focal and symmetrical throughout Skin: Clean and dry without rash or ulceration   Diagnostic Studies & Laboratory  data:     Recent Radiology Findings:   Dg Chest Port 1 View  Result Date: 06/23/2016 CLINICAL DATA:  Sudden onset of chest pain with shortness of breath and bilateral arm numbness. EXAM: PORTABLE CHEST 1 VIEW COMPARISON:  Chest  x-ray dated 10/11/2013. FINDINGS: There is mild cardiomegaly. Upper mediastinal contours are within normal limits. Lungs are clear. No evidence of pneumonia. No pleural effusion or pneumothorax seen. Osseous structures are unremarkable. IMPRESSION: 1. Mild cardiomegaly. 2. No acute findings.  Lungs are clear. Electronically Signed   By: Franki Cabot M.D.   On: 06/23/2016 20:43   Ct Angio Chest Aorta W And/or Wo Contrast  Result Date: 06/23/2016 CLINICAL DATA:  Chest pain radiating to the back EXAM: CT ANGIOGRAPHY CHEST WITH CONTRAST TECHNIQUE: Multidetector CT imaging of the chest was performed using the standard protocol during bolus administration of intravenous contrast. Multiplanar CT image reconstructions and MIPs were obtained to evaluate the vascular anatomy. CONTRAST:  75 cc Isovue 370 intravenous COMPARISON:  None. FINDINGS: Cardiovascular: Study optimized for evaluation of the aorta. No intramural hematoma on precontrast imaging. Negative for dissection or aneurysm. Three vessel arch with wide great vessel patency. No notable atheromatous changes on the aorta. No cardiomegaly or pericardial effusion. Coronary atherosclerotic calcification is extensive. The opacified pulmonary arteries show no filling defect. Mediastinum:  Negative for adenopathy. Lungs/Pleura: There is no edema, consolidation, effusion, or pneumothorax. Upper abdomen: No acute findings. 4 cm right adrenal mass which is predominantly soft tissue density but does have islands of fat. No comparison imaging to document stability. No evidence of acute hemorrhage. Musculoskeletal: No acute or aggressive finding.  Spondylosis. Review of the MIP images confirms the above findings. IMPRESSION: 1. Negative for thoracic aortic dissection or other acute finding. 2. Extensive coronary atherosclerosis. 3. 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 Electronically Signed   By: Monte Fantasia M.D.   On: 06/23/2016 22:45      I have independently reviewed the above radiologic studies.  Recent Lab Findings: Lab Results  Component Value Date   WBC 7.9 06/24/2016   HGB 12.8 (L) 06/24/2016   HCT 38.9 (L) 06/24/2016   PLT 258 06/24/2016   GLUCOSE 417 (H) 06/24/2016   CHOL 198 06/24/2016   TRIG 55 06/24/2016   HDL 77 06/24/2016   LDLCALC 110 (H) 06/24/2016   NA 134 (L) 06/24/2016   K 4.4 06/24/2016   CL 99 (L) 06/24/2016   CREATININE 1.14 06/24/2016   BUN 17 06/24/2016   CO2 25 06/24/2016   INR 1.01 06/23/2016   HGBA1C 7.7 (H) 02/12/2016      Assessment / Plan:      Severe three-vessel coronary disease with moderate left main stenosis  Diabetes mellitus poorly controlled Morbid obesity Sleep apnea Venous stasis-insufficiency of left lower extremity Intolerance to statins  Plan multivessel CABG on Friday, August 25 with bypass grafts to LAD, OM1, OM 2, RCA Will plan on left radial artery harvest because of expected problems with saphenous vein conduit

## 2016-06-24 NOTE — H&P (Signed)
CARDIOLOGY ADMISSION NOTE  Patient ID: Lawrence [name] MRN: [id number] DOB/AGE: 1951-04-08 65 y.o.  Admit date: 06/24/2016 Primary Physician   Sheral Flow, NP Primary Cardiologist   Dr. Nehemiah Massed Chief Complaint    Chest pain.    HPI:  The patient has CAD with a recent cath demonstrating severe three vessel disease as described below.  He has a history of CAD dating back to 2008.  He reports an MI and after cath he was managed medically.  However, he had recurrent chest pain in July of this year.  This was similar to his previous MI.  He saw Dr. Nehemiah Massed and had an abnormal perfusion study with anterolateral, lateral defects with infarct and ischemia.  Cath was done and it was decided that he would have CABG at [facility] electively.  The EF on cath was 60%.  This was scheduled for Sept 1st.  However, yesterday after eating and at rest he had recurrence of his chest pain.  This was very intense and similar to his previous angina.  It was substernal and heavy.  He had nausea and vomited.  He had radiation through to his back which was not like previous angina.  He took ASA and called 911.  However, the pain eased by the time he had arrived at [facility].  [facility] did not have a ready bed for the patient and the ED MD requested transfer to [facility].  Dr. Nehemiah Massed was notified apparently and agreed.  The patient is happy to receive his care here.    Of note the patient saw the dentist today to have work on a filling.   Past Medical History:  Diagnosis Date  . CAD (coronary artery disease)    07/2007  . COPD, mild ([place])   . Diabetes ([place])   . Elevated lipids   . Fatigue   . Glaucoma   . HBP (high blood pressure)   . Neuropathy ([place])   . Osteoarthritis   . Skin cancer   . Sleep apnea     Past Surgical History:  Procedure Laterality Date  . APPENDECTOMY    . CARDIAC CATHETERIZATION N/A 06/11/2016   Procedure: Left Heart Cath and Coronary Angiography;  Surgeon: Corey Skains, MD;  Location:  [facility];  Service: Cardiovascular;  Laterality: N/A;  . CARPAL TUNNEL RELEASE Left 04/24/2016   Procedure: CARPAL TUNNEL RELEASE;  Surgeon: Hessie Knows, MD;  Location: [facility] ORS;  Service: Orthopedics;  Laterality: Left;  . CATARACT EXTRACTION W/ INTRAOCULAR LENS  IMPLANT, BILATERAL Bilateral   . EYE SURGERY Bilateral   . JOINT REPLACEMENT    . KNEE ARTHROPLASTY Left 02/11/2016   Procedure: COMPUTER ASSISTED TOTAL KNEE ARTHROPLASTY;  Surgeon: Dereck Leep, MD;  Location: [facility] ORS;  Service: Orthopedics;  Laterality: Left;  . KNEE ARTHROSCOPY Right     Allergies  Allergen Reactions  . Nitroglycerin Nausea Only and Other (See Comments)    Patches only - headache  . Lipitor [Atorvastatin] Other (See Comments) and Rash    BODY PAIN  . Statins Other (See Comments)    BODY PAIN BODY PAIN   No current facility-administered medications on file prior to encounter.    Current Outpatient Prescriptions on File Prior to Encounter  Medication Sig Dispense Refill  . amoxicillin-clavulanate (AUGMENTIN) 875-125 MG tablet Take 1 tablet by mouth 2 (two) times daily. (Patient not taking: Reported on 06/04/2016) 28 tablet 0  . cetirizine (ZYRTEC) 10 MG tablet Take 10 mg by mouth daily.    [name]  cholecalciferol (VITAMIN D) 1000 units tablet Take 1,000 Units by mouth daily.    [name] docusate sodium (COLACE) 100 MG capsule Take 200 mg by mouth 2 (two) times daily.    . furosemide (LASIX) 20 MG tablet Take 20 mg by mouth 2 (two) times daily.     [name] glucagon (GLUCAGON EMERGENCY) 1 MG injection Inject 1 mg into the vein once as needed. Reported on 02/11/2016    . HYDROcodone-acetaminophen (NORCO) 5-325 MG tablet Take 1 tablet by mouth every 6 (six) hours as needed for moderate pain. (Patient not taking: Reported on 06/11/2016) 20 tablet 0  . insulin aspart (NOVOLOG) 100 UNIT/ML injection Inject 20 Units into the skin 3 (three) times daily. (Patient taking differently: Inject 20 Units into the skin 3 (three) times  daily with meals. ) 10 mL 5  . insulin glargine (LANTUS) 100 UNIT/ML injection Inject 20 Units into the skin at bedtime.     . isosorbide mononitrate (IMDUR) 30 MG 24 hr tablet Take 15 mg by mouth daily.    [name] latanoprost (XALATAN) 0.005 % ophthalmic solution Place 1 drop into both eyes at bedtime.    . metoprolol tartrate (LOPRESSOR) 25 MG tablet Take 25 mg by mouth 2 (two) times daily.    . Multiple Vitamin (MULTIVITAMIN) capsule Take 1 capsule by mouth daily.    . Naproxen Sodium (ALEVE PO) Take 220-440 mg by mouth daily as needed (pain).     . Plant Sterols and Stanols (CHOLESTOFF PO) Take 2 capsules by mouth 2 (two) times daily.    . ranolazine (RANEXA) 1000 MG SR tablet TAKE 1000 MG TWICE A DAY    . sulfamethoxazole-trimethoprim (BACTRIM DS,SEPTRA DS) 800-160 MG tablet Take 1 tablet by mouth 2 (two) times daily. Take this instead of Augmentin. + wound culture of Enterobacter. (Patient not taking: Reported on 06/04/2016) 14 tablet 0  . vitamin C (ASCORBIC ACID) 500 MG tablet Take 500 mg by mouth 2 (two) times daily.      Social History   Social History  . Marital status: Married    Spouse name: N/A  . Number of children: N/A  . Years of education: N/A   Occupational History  . Not on file.   Social History Main Topics  . Smoking status: Former Smoker    Packs/day: 1.00    Years: 16.00    Types: Cigarettes    Start date: 11/03/1969    Quit date: 07/22/1986  . Smokeless tobacco: Never Used  . Alcohol use 0.0 oz/week     Comment: OCCASIONALLY  . Drug use: No  . Sexual activity: Not on file   Other Topics Concern  . Not on file   Social History Narrative  . No narrative on file    Family History  Problem Relation Age of Onset  . Hypertension Mother   . Lupus Mother   . Hypertension Father   . Diabetes Father   . Arthritis Maternal Grandmother   . Arthritis Maternal Grandfather   . Arthritis Paternal Grandmother   . Arthritis Paternal Grandfather      ROS:  Sleeps on a  wedge.  Otherwise as stated in the HPI and negative for all other systems.  Physical Exam: Blood pressure (!) 141/60, pulse 69, temperature 98.2 F (36.8 C), temperature source Oral, resp. rate 20, height 5\' 11"  (1.803 m), weight 291 lb 6.4 oz (132.2 kg), SpO2 100 %.  GENERAL:  Well appearing HEENT:  Pupils equal round and reactive, fundi not visualized,  oral mucosa unremarkable NECK:  No jugular venous distention, waveform within normal limits, carotid upstroke brisk and symmetric, no bruits, no thyromegaly LYMPHATICS:  No cervical, inguinal adenopathy LUNGS:  Clear to auscultation bilaterally BACK:  No CVA tenderness CHEST:  Unremarkable HEART:  PMI not displaced or sustained,S1 and S2 within normal limits, no S3, no S4, no clicks, no rubs, no murmurs, obese  ABD:  Flat, positive bowel sounds normal in frequency in pitch, no bruits, no rebound, no guarding, no midline pulsatile mass, no hepatomegaly, no splenomegaly EXT:  2 plus pulses throughout, no edema, no cyanosis no clubbing SKIN:  No rashes no nodules NEURO:  Cranial nerves II through XII grossly intact, motor grossly intact throughout PSYCH:  Cognitively intact, oriented to person place and time   Labs: Lab Results  Component Value Date   BUN 21 (H) 06/23/2016   Lab Results  Component Value Date   CREATININE 1.14 06/23/2016   Lab Results  Component Value Date   NA 137 06/23/2016   K 3.7 06/23/2016   CL 103 06/23/2016   CO2 25 06/23/2016   Lab Results  Component Value Date   TROPONINI <0.03 06/23/2016   Lab Results  Component Value Date   WBC 8.8 06/23/2016   HGB 12.3 (L) 06/23/2016   HCT 36.3 (L) 06/23/2016   MCV 90.7 06/23/2016   PLT 244 06/23/2016   Lab Results  Component Value Date   HGBA1C 7.7 (H) 02/12/2016     CATH  Dist LAD-2 lesion, 95 %stenosed.  Dist LAD-1 lesion, 95 %stenosed.  Mid LAD lesion, 40 %stenosed.  Ost Cx lesion, 65 %stenosed.  1st Mrg lesion, 90 %stenosed.  2nd Mrg  lesion, 90 %stenosed.  Dist Cx lesion, 80 %stenosed.  LM lesion, 70 %stenosed.  Prox LAD lesion, 70 %stenosed.  Prox RCA-2 lesion, 95 %stenosed.  Prox RCA-1 lesion, 65 %stenosed.  Mid RCA lesion, 45 %stenosed.  Dist RCA lesion, 60 %stenosed.    Radiology:  CT:  1. Negative for thoracic aortic dissection or other acute finding. 2. Extensive coronary atherosclerosis. 3. 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   EKG:  NSR, rate 75, axis WNL, intervals WNL, no acute ST T wave changes.  Poor anterior R wave progression.   ASSESSMENT AND PLAN:    UNSTABLE ANGINA:  I have reviewed the films. The patient has diffuse disease. He has left main stenosis. Lesions are not particularly amenable to PCI. I agree that CABG as the best option and will consult [facility]  DM:  A1c is as above. The meds as listed and needs better control postoperatively.  DYSLIPIDEMIA:  Refused statins in the past but he is willing to try Crestor at this point.  I will start this and check fasting lipids.    SLEEP APNEA:  The patient wants to use his home CPAP    Signed: Minus Breeding 06/24/2016, 3:18 AM

## 2016-06-24 NOTE — Progress Notes (Signed)
CBG 320, patient asymptomatic, Hocherin MD notified, will continue to monitor

## 2016-06-25 ENCOUNTER — Inpatient Hospital Stay (HOSPITAL_COMMUNITY): Payer: Medicare Other

## 2016-06-25 DIAGNOSIS — Z01810 Encounter for preprocedural cardiovascular examination: Secondary | ICD-10-CM

## 2016-06-25 DIAGNOSIS — I25111 Atherosclerotic heart disease of native coronary artery with angina pectoris with documented spasm: Secondary | ICD-10-CM

## 2016-06-25 DIAGNOSIS — R9431 Abnormal electrocardiogram [ECG] [EKG]: Secondary | ICD-10-CM

## 2016-06-25 LAB — VAS US DOPPLER PRE CABG
LEFT ECA DIAS: -19 cm/s
LEFT VERTEBRAL DIAS: 13 cm/s
Left CCA dist dias: -9 cm/s
Left CCA dist sys: -92 cm/s
Left CCA prox dias: 13 cm/s
Left CCA prox sys: 153 cm/s
Left ICA dist dias: -22 cm/s
Left ICA dist sys: -92 cm/s
Left ICA prox dias: -18 cm/s
Left ICA prox sys: -82 cm/s
RIGHT ECA DIAS: -10 cm/s
RIGHT VERTEBRAL DIAS: 7 cm/s
Right CCA prox dias: 13 cm/s
Right CCA prox sys: 111 cm/s

## 2016-06-25 LAB — GLUCOSE, CAPILLARY
GLUCOSE-CAPILLARY: 285 mg/dL — AB (ref 65–99)
GLUCOSE-CAPILLARY: 311 mg/dL — AB (ref 65–99)
Glucose-Capillary: 113 mg/dL — ABNORMAL HIGH (ref 65–99)
Glucose-Capillary: 175 mg/dL — ABNORMAL HIGH (ref 65–99)

## 2016-06-25 LAB — URINALYSIS, ROUTINE W REFLEX MICROSCOPIC
Bilirubin Urine: NEGATIVE
Glucose, UA: NEGATIVE mg/dL
Hgb urine dipstick: NEGATIVE
Ketones, ur: NEGATIVE mg/dL
Leukocytes, UA: NEGATIVE
Nitrite: NEGATIVE
Protein, ur: NEGATIVE mg/dL
Specific Gravity, Urine: 1.022 (ref 1.005–1.030)
pH: 5.5 (ref 5.0–8.0)

## 2016-06-25 LAB — ECHOCARDIOGRAM COMPLETE
HEIGHTINCHES: 71 in
WEIGHTICAEL: 4662.4 [oz_av]

## 2016-06-25 LAB — HEPARIN LEVEL (UNFRACTIONATED): HEPARIN UNFRACTIONATED: 0.55 [IU]/mL (ref 0.30–0.70)

## 2016-06-25 MED ORDER — PERFLUTREN LIPID MICROSPHERE
1.0000 mL | INTRAVENOUS | Status: DC | PRN
  Administered 2016-06-25: 2 mL via INTRAVENOUS
  Filled 2016-06-25: qty 10

## 2016-06-25 NOTE — Progress Notes (Addendum)
Pre-op Cardiac Surgery  Carotid Findings:  Findings suggest 1-39% internal carotid artery stenosis bilaterally. Vertebral arteries are patent with antegrade flow.   Upper Extremity Right Left  Brachial Pressures 141-Triphasic Triphasic  Radial Waveforms Triphasic Triphasic  Ulnar Waveforms Triphasic Triphasic  Palmar Arch (Allen's Test) Signal is unaffected with radial compression, decreases >50% with ulnar compression. Signal obliterates with radial compression, decreases >50% with ulnar compression.    Lower  Extremity Right Left  Dorsalis Pedis 233-Triphasic 200-Triphasic  Anterior Tibial    Posterior Tibial 81-Monophasic 146-Biphasic  Ankle/Brachial Indices 1.65 1.42    Findings:   Bilateral ABIs are elevated, suggestive of possible medial calcification.   Right Lower Extremity Vein Map    Right Great Saphenous Vein   Segment Diameter Comment  1. Origin 7.47mm   2. High Thigh 7.5mm   3. Mid Thigh 2.45mm Branch  4. Low Thigh 1.4mm   5. At Knee 1.46mm Branch  6. High Calf 2.33mm   7. Low Calf 2.87mm Multiple branches  8. Ankle 1.84mm                 Right Small Saphenous Vein  Segment Diameter Comment  1. Origin 2.71mm   2. High Calf 1.38mm   3. Low Calf 1.52mm   4. Ankle 2.33mm                 06/25/2016 3:08 PM Maudry Mayhew, BS, RVT, RDCS, RDMS

## 2016-06-25 NOTE — Progress Notes (Signed)
CARDIAC REHAB PHASE I   PRE:  Rate/Rhythm: 84 SR    BP: sitting 136/60    SaO2: 99 RA  MODE:  Ambulation: 460 ft   POST:  Rate/Rhythm: 95 SR    BP: sitting ?130/76 (cuff too small)     SaO2: 99 RA  Tolerated well. Able to walk pushing IV pole. No CP/SOB or unsteadiness. To recliner. Preop ed done with pt and wife, voicing understanding. He still needs IS, going to PFTs today so he should get it there. Gave [facility] booklet, care guide, and videos to watch. Encouraged more walking if not CP. Good reception. Wife is attentive. [place], ACSM 06/25/2016 9:08 AM

## 2016-06-25 NOTE — Progress Notes (Signed)
[place] for Heparin  Indication: Severe three vessel disease awaiting CVTS consult  Allergies  Allergen Reactions  . Nitroglycerin Nausea Only and Other (See Comments)    Patches only - headache  . Lisinopril Other (See Comments)    Light headed, dizzy  . Lipitor [Atorvastatin] Other (See Comments) and Rash    BODY PAIN  . Statins Other (See Comments)    BODY PAIN BODY PAIN    Patient Measurements: Height: 5\' 11"  (180.3 cm) Weight: 291 lb 6.4 oz (132.2 kg) IBW/kg (Calculated) : 75.3  Vital Signs: Temp: 98.3 F (36.8 C) (08/23 0458) Temp Source: Oral (08/23 0458) BP: 135/60 (08/23 0833) Pulse Rate: 87 (08/23 0833)  Labs:  Recent Labs  06/23/16 1955  06/24/16 0416 06/24/16 1310 06/24/16 1457 06/24/16 1926 06/25/16 0903  HGB 12.3*  --  12.8*  --   --   --   --   HCT 36.3*  --  38.9*  --   --   --   --   PLT 244  --  258  --   --   --   --   APTT 32  --   --   --   --   --   --   LABPROT 13.3  --   --   --   --   --   --   INR 1.01  --   --   --   --   --   --   HEPARINUNFRC  --   --   --  0.31  --  0.40 0.55  CREATININE 1.14  --  1.14  --   --   --   --   TROPONINI <0.03  < > <0.03 0.18* 0.25*  --   --   < > = values in this interval not displayed.  Estimated Creatinine Clearance: 87.2 mL/min (by C-G formula based on SCr of 1.14 mg/dL).   Medical History: Past Medical History:  Diagnosis Date  . CAD (coronary artery disease)    07/2007  . Diabetes ([place])   . Elevated lipids   . Glaucoma   . HBP (high blood pressure)   . Neuropathy ([place])   . Osteoarthritis   . Skin cancer   . Sleep apnea    CPAP    Assessment: 65 y/o M with CP, was going to have elective CABG at [facility] in a few weeks, starting heparin, Hgb 12.8, heparin drip was turned off around 0300 this AM at time of transfer.  HL remains therapeutic at 0.55 on heparin 1400 units/hr. No issues with infusion or bleeding noted.  Goal of Therapy:  Heparin  level 0.3-0.7 units/ml Monitor platelets by anticoagulation protocol: Yes   Plan:  Continue heparin 1400 units/hr Daily HL Monitor s/sx of bleeding  Andrey Cota. Diona Foley, PharmD, BCPS Clinical Pharmacist Pager [phone] 06/25/2016,9:35 AM

## 2016-06-25 NOTE — Progress Notes (Signed)
Patient ambulated 123ft , no c/o pain or distress voiced during ambulation, will continue to monitor

## 2016-06-25 NOTE — Progress Notes (Signed)
Echocardiogram 2D Echocardiogram has been performed with definity.  [name] 06/25/2016, 10:59 AM

## 2016-06-25 NOTE — Progress Notes (Signed)
Primary Cardiologist: Dr. Melvern Sample [facility])  Patient Profile: 65 y/o male followed by Dr. 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 He reports an MI at that time and after cath he was managed medically.  However, he had recurrent chest pain in July of this year.  This was similar to his previous MI.  He saw Dr. Nehemiah Massed and had an abnormal perfusion study with anterolateral, lateral defects with infarct and ischemia.  Cath was done and it was decided that he would have CABG at [facility] electively.  The EF on cath was 60%.  CABG is scheduled for Sept 1st. However patient presented to [facility] with severe recurrent CP. No beds available at [facility] thus patient transferred to [facility]. Patient seen by Dr. Percival Spanish overnight who has recommended CT consultation here at [facility].   Subjective: Chest pain free with IV heparin.   Objective: Vital signs in last 24 hours: Temp:  [97.6 F (36.4 C)-98.3 F (36.8 C)] 98.3 F (36.8 C) (08/23 0458) Pulse Rate:  [65-87] 87 (08/23 0833) Resp:  [18-19] 18 (08/23 0458) BP: (129-139)/(56-78) 135/60 (08/23 0833) SpO2:  [98 %-100 %] 98 % (08/23 0458) Last BM Date: 06/24/16  Intake/Output from previous day: 08/22 0701 - 08/23 0700 In: 840 [P.O.:840] Out: 180 [Urine:180] Intake/Output this shift: No intake/output data recorded.  Medications Current Facility-Administered Medications  Medication Dose Route Frequency Provider Last Rate Last Dose  . acetaminophen (TYLENOL) tablet 650 mg  650 mg Oral Q4H PRN Minus Breeding, MD      . aspirin EC tablet 81 mg  81 mg Oral Daily Minus Breeding, MD   81 mg at 06/25/16 [id number]  . cholecalciferol (VITAMIN D) tablet 1,000 Units  1,000 Units Oral Daily Minus Breeding, MD   1,000 Units at 06/25/16 [phone]  . docusate sodium (COLACE) capsule 200 mg  200 mg Oral BID Minus Breeding, MD   200 mg at 06/25/16 [id number]  . furosemide (LASIX) tablet 20 mg  20 mg Oral BID Minus Breeding, MD   20 mg at 06/25/16  [id number]  . heparin ADULT infusion 100 units/mL (25000 units/223mL sodium chloride 0.45%)  1,400 Units/hr Intravenous Continuous Erenest Blank, RPH 14 mL/hr at 06/25/16 0957 1,400 Units/hr at 06/25/16 0957  . insulin aspart (novoLOG) injection 0-20 Units  0-20 Units Subcutaneous TID WC Eileen Stanford, PA-C   11 Units at 06/25/16 [phone]  . insulin aspart (novoLOG) injection 20 Units  20 Units Subcutaneous TID WC Minus Breeding, MD   20 Units at 06/25/16 [phone]  . insulin glargine (LANTUS) injection 30 Units  30 Units Subcutaneous BID Ivin Poot, MD   30 Units at 06/25/16 [phone]  . isosorbide mononitrate (IMDUR) 24 hr tablet 15 mg  15 mg Oral Daily Minus Breeding, MD   15 mg at 06/25/16 [phone]  . latanoprost (XALATAN) 0.005 % ophthalmic solution 1 drop  1 drop Both Eyes QHS Minus Breeding, MD   1 drop at 06/24/16 2204  . metoprolol tartrate (LOPRESSOR) tablet 25 mg  25 mg Oral BID Minus Breeding, MD   25 mg at 06/25/16 0834  . multivitamin with minerals tablet 1 tablet  1 tablet Oral Daily Minus Breeding, MD   1 tablet at 06/25/16 [phone]  . nitroGLYCERIN (NITROSTAT) SL tablet 0.4 mg  0.4 mg Sublingual Q5 Min x 3 PRN Minus Breeding, MD      . ondansetron [facility]) injection 4 mg  4 mg Intravenous Q6H PRN Minus Breeding, MD      .  rosuvastatin (CRESTOR) tablet 20 mg  20 mg Oral q1800 Minus Breeding, MD   20 mg at 06/24/16 1718    PE: General appearance: alert, cooperative and no distress, obese  Neck: no carotid bruit and no JVD Lungs: clear to auscultation bilaterally Heart: regular rate and rhythm, S1, S2 normal, no murmur, click, rub or gallop Extremities: no LEE Pulses: 2+ and symmetric Skin: warm and dry Neurologic: Grossly normal  Lab Results:   Recent Labs  06/23/16 1955 06/24/16 0416  WBC 8.8 7.9  HGB 12.3* 12.8*  HCT 36.3* 38.9*  PLT 244 258   BMET  Recent Labs  06/23/16 1955 06/24/16 0416  NA 137 134*  K 3.7 4.4  CL 103 99*  CO2 25 25  GLUCOSE 329* 417*  BUN 21* 17    CREATININE 1.14 1.14  CALCIUM 8.5* 8.9   PT/INR  Recent Labs  06/23/16 1955  LABPROT 13.3  INR 1.01   Cholesterol  Recent Labs  06/24/16 0416  CHOL 198    Studies/Results: Recent LHC at [facility] LAD-2 lesion, 95 %stenosed.  Dist LAD-1 lesion, 95 %stenosed.  Mid LAD lesion, 40 %stenosed.  Ost Cx lesion, 65 %stenosed.  1st Mrg lesion, 90 %stenosed.  2nd Mrg lesion, 90 %stenosed.  Dist Cx lesion, 80 %stenosed.  LM lesion, 70 %stenosed.  Prox LAD lesion, 70 %stenosed.  Prox RCA-2 lesion, 95 %stenosed.  Prox RCA-1 lesion, 65 %stenosed.  Mid RCA lesion, 45 %stenosed.  Dist RCA lesion, 60 %stenosed.   Assessment/Plan  Active Problems:   Unstable angina (HCC)   1. UNSTABLE ANGINA/ Severe 3V CAD:  The patient has diffuse disease. He has left main stenosis. Lesions are not particularly amenable to PCI. CABG is felt to be the best option.  Dr. Prescott Gum is planning CABG on Friday    2. DM:  A1c 7.7 02/2016. BG was as high as 450 earlier today but improving. Most recent POC glucose was 234. Continue Insulin.   3. DYSLIPIDEMIA:  Refused statins in the past but he is willing to try Crestor at this point.  Lipid panel showed LDL of 110 mg/dL. Crestor 20 mg initiated. Repeat FLP + HFTs in 6 weeks. LDL target <70 mg/dL.   4. SLEEP APNEA:  The patient wants to use his home CPAP    LOS: 1 day     Mertie Moores, MD  06/25/2016 10:42 AM    [facility] [address],  [address], [place]  [address] Pager [phone] Phone: [phone]; Fax: [phone]

## 2016-06-26 ENCOUNTER — Ambulatory Visit: Payer: Medicare Other

## 2016-06-26 ENCOUNTER — Inpatient Hospital Stay (HOSPITAL_COMMUNITY): Payer: Medicare Other

## 2016-06-26 LAB — PULMONARY FUNCTION TEST
DL/VA % pred: 104 %
DL/VA: 4.8 ml/min/mmHg/L
DLCO cor % pred: 87 %
DLCO cor: 28.35 ml/min/mmHg
DLCO unc % pred: 82 %
DLCO unc: 26.8 ml/min/mmHg
FEF 25-75 Post: 3.86 L/sec
FEF 25-75 Pre: 2.97 L/sec
FEF2575-%Change-Post: 29 %
FEF2575-%Pred-Post: 148 %
FEF2575-%Pred-Pre: 114 %
FEV1-%Change-Post: 5 %
FEV1-%Pred-Post: 103 %
FEV1-%Pred-Pre: 97 %
FEV1-Post: 3.46 L
FEV1-Pre: 3.26 L
FEV1FVC-%Change-Post: 3 %
FEV1FVC-%Pred-Pre: 105 %
FEV6-%Change-Post: 2 %
FEV6-%Pred-Post: 99 %
FEV6-%Pred-Pre: 96 %
FEV6-Post: 4.26 L
FEV6-Pre: 4.15 L
FEV6FVC-%Change-Post: 0 %
FEV6FVC-%Pred-Post: 105 %
FEV6FVC-%Pred-Pre: 104 %
FVC-%Change-Post: 2 %
FVC-%Pred-Post: 94 %
FVC-%Pred-Pre: 92 %
FVC-Post: 4.26 L
FVC-Pre: 4.17 L
Post FEV1/FVC ratio: 81 %
Post FEV6/FVC ratio: 100 %
Pre FEV1/FVC ratio: 78 %
Pre FEV6/FVC Ratio: 100 %
RV % pred: 91 %
RV: 2.21 L
TLC % pred: 92 %
TLC: 6.49 L

## 2016-06-26 LAB — SURGICAL PCR SCREEN
MRSA, PCR: NEGATIVE
Staphylococcus aureus: NEGATIVE

## 2016-06-26 LAB — ABO/RH: ABO/RH(D): O POS

## 2016-06-26 LAB — GLUCOSE, CAPILLARY
GLUCOSE-CAPILLARY: 61 mg/dL — AB (ref 65–99)
GLUCOSE-CAPILLARY: 328 mg/dL — AB (ref 65–99)
Glucose-Capillary: 108 mg/dL — ABNORMAL HIGH (ref 65–99)
Glucose-Capillary: 162 mg/dL — ABNORMAL HIGH (ref 65–99)
Glucose-Capillary: 92 mg/dL (ref 65–99)
Glucose-Capillary: 142 mg/dL — ABNORMAL HIGH (ref 65–99)

## 2016-06-26 LAB — HEPARIN LEVEL (UNFRACTIONATED): Heparin Unfractionated: 0.56 IU/mL (ref 0.30–0.70)

## 2016-06-26 LAB — PREPARE RBC (CROSSMATCH)

## 2016-06-26 MED ORDER — SODIUM CHLORIDE 0.9 % IV SOLN
INTRAVENOUS | Status: DC
  Filled 2016-06-26: qty 40

## 2016-06-26 MED ORDER — DEXTROSE 5 % IV SOLN
750.0000 mg | INTRAVENOUS | Status: DC
  Filled 2016-06-26: qty 750

## 2016-06-26 MED ORDER — DEXMEDETOMIDINE HCL IN NACL 400 MCG/100ML IV SOLN
0.1000 ug/kg/h | INTRAVENOUS | Status: DC
  Filled 2016-06-26: qty 100

## 2016-06-26 MED ORDER — SODIUM CHLORIDE 0.9 % IV SOLN
INTRAVENOUS | Status: DC
  Filled 2016-06-26: qty 2.5

## 2016-06-26 MED ORDER — PHENYLEPHRINE HCL 10 MG/ML IJ SOLN
30.0000 ug/min | INTRAMUSCULAR | Status: DC
  Filled 2016-06-26: qty 2

## 2016-06-26 MED ORDER — CHLORHEXIDINE GLUCONATE 0.12 % MT SOLN
15.0000 mL | Freq: Once | OROMUCOSAL | Status: AC
  Administered 2016-06-27: 15 mL via OROMUCOSAL
  Filled 2016-06-26: qty 15

## 2016-06-26 MED ORDER — EPINEPHRINE HCL 1 MG/ML IJ SOLN
0.0000 ug/min | INTRAVENOUS | Status: DC
  Filled 2016-06-26: qty 4

## 2016-06-26 MED ORDER — DOPAMINE-DEXTROSE 3.2-5 MG/ML-% IV SOLN
0.0000 ug/kg/min | INTRAVENOUS | Status: AC
  Administered 2016-06-27: 2 ug/kg/min via INTRAVENOUS
  Filled 2016-06-26: qty 250

## 2016-06-26 MED ORDER — DIAZEPAM 5 MG PO TABS
5.0000 mg | ORAL_TABLET | Freq: Once | ORAL | Status: AC
  Administered 2016-06-27: 5 mg via ORAL
  Filled 2016-06-26: qty 1

## 2016-06-26 MED ORDER — INSULIN REGULAR HUMAN 100 UNIT/ML IJ SOLN
INTRAMUSCULAR | Status: AC
  Administered 2016-06-27: 1.4 [IU]/h via INTRAVENOUS
  Filled 2016-06-26: qty 2.5

## 2016-06-26 MED ORDER — PLASMA-LYTE 148 IV SOLN
INTRAVENOUS | Status: DC
  Filled 2016-06-26: qty 2.5

## 2016-06-26 MED ORDER — NITROGLYCERIN IN D5W 200-5 MCG/ML-% IV SOLN
2.0000 ug/min | INTRAVENOUS | Status: AC
  Administered 2016-06-27: 5 ug/min via INTRAVENOUS

## 2016-06-26 MED ORDER — SODIUM CHLORIDE 0.9 % IV SOLN
INTRAVENOUS | Status: DC
  Filled 2016-06-26: qty 30

## 2016-06-26 MED ORDER — DEXTROSE 5 % IV SOLN
1.5000 g | INTRAVENOUS | Status: AC
  Administered 2016-06-27: 1.5 g via INTRAVENOUS
  Administered 2016-06-27: .75 g via INTRAVENOUS
  Filled 2016-06-26: qty 1.5

## 2016-06-26 MED ORDER — METOPROLOL TARTRATE 12.5 MG HALF TABLET
12.5000 mg | ORAL_TABLET | Freq: Once | ORAL | Status: AC
  Administered 2016-06-27: 12.5 mg via ORAL
  Filled 2016-06-26: qty 1

## 2016-06-26 MED ORDER — MAGNESIUM SULFATE 50 % IJ SOLN
40.0000 meq | INTRAMUSCULAR | Status: DC
  Filled 2016-06-26: qty 10

## 2016-06-26 MED ORDER — DOPAMINE-DEXTROSE 3.2-5 MG/ML-% IV SOLN: 0.0000 ug/kg/min | INTRAVENOUS | Status: DC

## 2016-06-26 MED ORDER — BISACODYL 5 MG PO TBEC: 5.0000 mg | DELAYED_RELEASE_TABLET | Freq: Once | ORAL | Status: DC

## 2016-06-26 MED ORDER — NITROGLYCERIN IN D5W 200-5 MCG/ML-% IV SOLN
2.0000 ug/min | INTRAVENOUS | Status: DC
  Filled 2016-06-26: qty 250

## 2016-06-26 MED ORDER — CHLORHEXIDINE GLUCONATE 4 % EX LIQD
60.0000 mL | Freq: Once | CUTANEOUS | Status: AC
  Administered 2016-06-27: 4 via TOPICAL
  Filled 2016-06-26: qty 60

## 2016-06-26 MED ORDER — PHENYLEPHRINE HCL 10 MG/ML IJ SOLN
30.0000 ug/min | INTRAVENOUS | Status: DC
  Filled 2016-06-26: qty 2

## 2016-06-26 MED ORDER — POTASSIUM CHLORIDE 2 MEQ/ML IV SOLN
80.0000 meq | INTRAVENOUS | Status: DC
  Filled 2016-06-26: qty 40

## 2016-06-26 MED ORDER — CHLORHEXIDINE GLUCONATE 4 % EX LIQD
60.0000 mL | Freq: Once | CUTANEOUS | Status: AC
  Administered 2016-06-26: 4 via TOPICAL
  Filled 2016-06-26: qty 60

## 2016-06-26 MED ORDER — ALBUTEROL SULFATE (2.5 MG/3ML) 0.083% IN NEBU
2.5000 mg | INHALATION_SOLUTION | Freq: Once | RESPIRATORY_TRACT | Status: AC
  Administered 2016-06-26: 2.5 mg via RESPIRATORY_TRACT

## 2016-06-26 MED ORDER — SODIUM CHLORIDE 0.9 % IV SOLN
1500.0000 mg | INTRAVENOUS | Status: AC
  Administered 2016-06-27: 1500 mg via INTRAVENOUS
  Filled 2016-06-26: qty 1500

## 2016-06-26 MED ORDER — TEMAZEPAM 15 MG PO CAPS: 15.0000 mg | ORAL_CAPSULE | Freq: Once | ORAL | Status: DC | PRN

## 2016-06-26 MED ORDER — PLASMA-LYTE 148 IV SOLN
INTRAVENOUS | Status: AC
  Administered 2016-06-27: 500 mL
  Filled 2016-06-26: qty 2.5

## 2016-06-26 NOTE — Progress Notes (Addendum)
  Hypoglycemic Event  CBG: 61  Treatment: 8oz of skim milk, 15g of graham cracker and peanut butter  Symptoms:profuse sweating. Vital signs remain stable  Follow-up CBG: Time:0515 CBG Result:108  Possible Reasons for Event: disease process, medication regimen  Comments/MD notified:Turner MD    Lillie Columbia R Shiryl Ruddy

## 2016-06-26 NOTE — Progress Notes (Signed)
Procedure(s) (LRB): CORONARY ARTERY BYPASS GRAFTING (CABG) (N/A) TRANSESOPHAGEAL ECHOCARDIOGRAM (TEE) (N/A) RADIAL ARTERY HARVEST, left (Left) Subjective: Remains stable on iv heparin Dopplers indicate L radial artery cannot be safely harvested- R radial artery will be used if saph vein conduit not adequate Plan CABG in am 8-25  Objective: Vital signs in last 24 hours: Temp:  [97.6 F (36.4 C)-98.2 F (36.8 C)] 97.6 F (36.4 C) (08/24 0445) Pulse Rate:  [69-79] 69 (08/24 0445) Cardiac Rhythm: Normal sinus rhythm (08/24 0700) Resp:  [18-20] 18 (08/24 0445) BP: (116-147)/(50-69) 134/69 (08/24 0445) SpO2:  [100 %] 100 % (08/24 0445)  Hemodynamic parameters for last 24 hours:  stable  Intake/Output from previous day: 08/23 0701 - 08/24 0700 In: 236 [P.O.:236] Out: 950 [Urine:950] Intake/Output this shift: No intake/output data recorded.       Exam    General- alert and comfortable   Lungs- clear without rales, wheezes   Cor- regular rate and rhythm, no murmur , gallop   Abdomen- soft, non-tender   Extremities - warm, non-tender, minimal edema   Neuro- oriented, appropriate, no focal weakness   Lab Results:  Recent Labs  06/23/16 1955 06/24/16 0416  WBC 8.8 7.9  HGB 12.3* 12.8*  HCT 36.3* 38.9*  PLT 244 258   BMET:  Recent Labs  06/23/16 1955 06/24/16 0416  NA 137 134*  K 3.7 4.4  CL 103 99*  CO2 25 25  GLUCOSE 329* 417*  BUN 21* 17  CREATININE 1.14 1.14  CALCIUM 8.5* 8.9    PT/INR:  Recent Labs  06/23/16 1955  LABPROT 13.3  INR 1.01   ABG No results found for: PHART, HCO3, TCO2, ACIDBASEDEF, O2SAT CBG (last 3)   Recent Labs  06/26/16 0441 06/26/16 0516 06/26/16 0553  GLUCAP 61* 108* 162*    Assessment/Plan: 3- v CAD, unstable angina CABG tomorrow   LOS: 2 days    Tharon Aquas Trigt III 06/26/2016

## 2016-06-26 NOTE — Progress Notes (Signed)
[place] for Heparin  Indication: Severe three vessel disease awaiting CVTS consult  Allergies  Allergen Reactions  . Nitroglycerin Nausea Only and Other (See Comments)    Patches only - headache  . Lisinopril Other (See Comments)    Light headed, dizzy  . Lipitor [Atorvastatin] Other (See Comments) and Rash    BODY PAIN  . Statins Other (See Comments)    BODY PAIN BODY PAIN    Patient Measurements: Height: 5\' 11"  (180.3 cm) Weight: 291 lb 6.4 oz (132.2 kg) IBW/kg (Calculated) : 75.3  Vital Signs: Temp: 97.6 F (36.4 C) (08/24 0445) Temp Source: Oral (08/24 0445) BP: 134/69 (08/24 0445) Pulse Rate: 69 (08/24 0445)  Labs:  Recent Labs  06/23/16 1955  06/24/16 0416  06/24/16 1310 06/24/16 1457 06/24/16 1926 06/25/16 0903 06/26/16 0415  HGB 12.3*  --  12.8*  --   --   --   --   --   --   HCT 36.3*  --  38.9*  --   --   --   --   --   --   PLT 244  --  258  --   --   --   --   --   --   APTT 32  --   --   --   --   --   --   --   --   LABPROT 13.3  --   --   --   --   --   --   --   --   INR 1.01  --   --   --   --   --   --   --   --   HEPARINUNFRC  --   --   --   < > 0.31  --  0.40 0.55 0.56  CREATININE 1.14  --  1.14  --   --   --   --   --   --   TROPONINI <0.03  < > <0.03  --  0.18* 0.25*  --   --   --   < > = values in this interval not displayed.  Estimated Creatinine Clearance: 87.2 mL/min (by C-G formula based on SCr of 1.14 mg/dL).   Medical History: Past Medical History:  Diagnosis Date  . CAD (coronary artery disease)    07/2007  . Diabetes ([place])   . Elevated lipids   . Glaucoma   . HBP (high blood pressure)   . Neuropathy ([place])   . Osteoarthritis   . Skin cancer   . Sleep apnea    CPAP    Assessment: 65 y/o M with CP and noted with severe 3VCAD. He is on heparin with plans for CABG on 8/25. -heparin level is at goal   Goal of Therapy:  Heparin level 0.3-0.7 units/ml Monitor platelets by  anticoagulation protocol: Yes   Plan:  Continue heparin 1400 units/hr Daily HL  Hildred Laser, Pharm D 06/26/2016 9:04 AM

## 2016-06-26 NOTE — Progress Notes (Signed)
Primary Cardiologist: Dr. Melvern Sample [facility])  Patient Profile: 65 y/o male followed by Dr. 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 He reports an MI at that time and after cath he was managed medically.  However, he had recurrent chest pain in July of this year.  This was similar to his previous MI.  He saw Dr. Nehemiah Massed and had an abnormal perfusion study with anterolateral, lateral defects with infarct and ischemia.  Cath was done and it was decided that he would have CABG at [facility] electively.  The EF on cath was 60%.  CABG is scheduled for Sept 1st. However patient presented to [facility] with severe recurrent CP. No beds available at [facility] thus patient transferred to [facility]. Patient seen by Dr. Percival Spanish overnight who has recommended CT consultation here at [facility].   Doing well. Plan is for CABG tomorrow  Subjective: Chest pain free with IV heparin.   Objective: Vital signs in last 24 hours: Temp:  [97.6 F (36.4 C)-98.2 F (36.8 C)] 97.6 F (36.4 C) (08/24 0445) Pulse Rate:  [69-79] 69 (08/24 0445) Resp:  [18-20] 18 (08/24 0445) BP: (116-147)/(50-69) 134/69 (08/24 0445) SpO2:  [100 %] 100 % (08/24 0445) Last BM Date: 06/24/16  Intake/Output from previous day: 08/23 0701 - 08/24 0700 In: 236 [P.O.:236] Out: 950 [Urine:950] Intake/Output this shift: Total I/O In: -  Out: 650 [Urine:650]  Medications Current Facility-Administered Medications  Medication Dose Route Frequency Provider Last Rate Last Dose  . acetaminophen (TYLENOL) tablet 650 mg  650 mg Oral Q4H PRN Minus Breeding, MD      . Derrill Memo ON 06/27/2016] aminocaproic acid (AMICAR) 10 g in sodium chloride 0.9 % 100 mL infusion   Intravenous To OR Kris Mouton, RPH      . aspirin EC tablet 81 mg  81 mg Oral Daily Minus Breeding, MD   81 mg at 06/26/16 0900  . bisacodyl (DULCOLAX) EC tablet 5 mg  5 mg Oral Once Ivin Poot, MD      . Derrill Memo ON 06/27/2016] cefUROXime (ZINACEF) 750 mg  in dextrose 5 % 50 mL IVPB  750 mg Intravenous To OR Kris Mouton, RPH      . chlorhexidine (HIBICLENS) 4 % liquid 4 application  60 mL Topical Once Ivin Poot, MD       And  . Derrill Memo ON 06/27/2016] chlorhexidine (HIBICLENS) 4 % liquid 4 application  60 mL Topical Once Ivin Poot, MD      . Derrill Memo ON 06/27/2016] chlorhexidine (PERIDEX) 0.12 % solution 15 mL  15 mL Mouth/Throat Once Ivin Poot, MD      . cholecalciferol (VITAMIN D) tablet 1,000 Units  1,000 Units Oral Daily Minus Breeding, MD   1,000 Units at 06/26/16 0900  . [START ON 06/27/2016] dexmedetomidine (PRECEDEX) 400 MCG/100ML (4 mcg/mL) infusion  0.1-0.7 mcg/kg/hr Intravenous To OR Kris Mouton, RPH      . [START ON 06/27/2016] diazepam (VALIUM) tablet 5 mg  5 mg Oral Once Ivin Poot, MD      . docusate sodium (COLACE) capsule 200 mg  200 mg Oral BID Minus Breeding, MD   200 mg at 06/26/16 0901  . [START ON 06/27/2016] DOPamine (INTROPIN) 800 mg in dextrose 5 % 250 mL (3.2 mg/mL) infusion  0-10 mcg/kg/min Intravenous To OR Kris Mouton, RPH      . [START ON 06/27/2016] EPINEPHrine (ADRENALIN) 4 mg in dextrose 5 % 250 mL (0.016  mg/mL) infusion  0-10 mcg/min Intravenous To OR Kris Mouton, RPH      . furosemide (LASIX) tablet 20 mg  20 mg Oral BID Minus Breeding, MD   20 mg at 06/26/16 0859  . [START ON 06/27/2016] heparin 2,500 Units, papaverine 30 mg in electrolyte-148 (PLASMALYTE-148) 500 mL irrigation   Irrigation To OR Kris Mouton, RPH      . [START ON 06/27/2016] heparin 30,000 units/NS 1000 mL solution for CELLSAVER   Other To OR Kris Mouton, RPH      . heparin ADULT infusion 100 units/mL (25000 units/271mL sodium chloride 0.45%)  1,400 Units/hr Intravenous Continuous Erenest Blank, RPH 14 mL/hr at 06/26/16 0500 1,400 Units/hr at 06/26/16 0500  . insulin aspart (novoLOG) injection 0-20 Units  0-20 Units Subcutaneous TID WC Eileen Stanford, PA-C   15 Units at 06/26/16 1145  . insulin aspart (novoLOG)  injection 20 Units  20 Units Subcutaneous TID WC Minus Breeding, MD   20 Units at 06/26/16 1145  . insulin glargine (LANTUS) injection 30 Units  30 Units Subcutaneous BID Ivin Poot, MD   30 Units at 06/26/16 0900  . [START ON 06/27/2016] insulin regular (NOVOLIN R,HUMULIN R) 250 Units in sodium chloride 0.9 % 250 mL (1 Units/mL) infusion   Intravenous To OR Kris Mouton, RPH      . isosorbide mononitrate (IMDUR) 24 hr tablet 15 mg  15 mg Oral Daily Minus Breeding, MD   15 mg at 06/26/16 0900  . latanoprost (XALATAN) 0.005 % ophthalmic solution 1 drop  1 drop Both Eyes QHS Minus Breeding, MD   1 drop at 06/25/16 2134  . [START ON 06/27/2016] magnesium sulfate (IV Push/IM) injection 40 mEq  40 mEq Other To OR Kris Mouton, RPH      . [START ON 06/27/2016] metoprolol tartrate (LOPRESSOR) tablet 12.5 mg  12.5 mg Oral Once Ivin Poot, MD      . metoprolol tartrate (LOPRESSOR) tablet 25 mg  25 mg Oral BID Minus Breeding, MD   25 mg at 06/26/16 0900  . multivitamin with minerals tablet 1 tablet  1 tablet Oral Daily Minus Breeding, MD   1 tablet at 06/26/16 0901  . nitroGLYCERIN (NITROSTAT) SL tablet 0.4 mg  0.4 mg Sublingual Q5 Min x 3 PRN Minus Breeding, MD      . Derrill Memo ON 06/27/2016] nitroGLYCERIN 50 mg in dextrose 5 % 250 mL (0.2 mg/mL) infusion  2-200 mcg/min Intravenous To OR Kris Mouton, RPH      . ondansetron [facility]) injection 4 mg  4 mg Intravenous Q6H PRN Minus Breeding, MD      . Derrill Memo ON 06/27/2016] phenylephrine (NEO-SYNEPHRINE) 20 mg in dextrose 5 % 250 mL (0.08 mg/mL) infusion  30-200 mcg/min Intravenous To OR Kris Mouton, RPH      . [START ON 06/27/2016] potassium chloride injection 80 mEq  80 mEq Other To OR Kris Mouton, RPH      . rosuvastatin (CRESTOR) tablet 20 mg  20 mg Oral q1800 Minus Breeding, MD   20 mg at 06/25/16 1728  . temazepam (RESTORIL) capsule 15 mg  15 mg Oral Once PRN Ivin Poot, MD        PE: General appearance: alert, cooperative and no  distress, obese  Neck: no carotid bruit and no JVD Lungs: clear to auscultation bilaterally Heart: regular rate and rhythm, S1, S2 normal, no murmur, click, rub or gallop Extremities: no LEE Pulses: 2+  and symmetric Skin: warm and dry Neurologic: Grossly normal  Lab Results:   Recent Labs  06/23/16 1955 06/24/16 0416  WBC 8.8 7.9  HGB 12.3* 12.8*  HCT 36.3* 38.9*  PLT 244 258   BMET  Recent Labs  06/23/16 1955 06/24/16 0416  NA 137 134*  K 3.7 4.4  CL 103 99*  CO2 25 25  GLUCOSE 329* 417*  BUN 21* 17  CREATININE 1.14 1.14  CALCIUM 8.5* 8.9   PT/INR  Recent Labs  06/23/16 1955  LABPROT 13.3  INR 1.01   Cholesterol  Recent Labs  06/24/16 0416  CHOL 198    Studies/Results: Recent LHC at [facility] LAD-2 lesion, 95 %stenosed.  Dist LAD-1 lesion, 95 %stenosed.  Mid LAD lesion, 40 %stenosed.  Ost Cx lesion, 65 %stenosed.  1st Mrg lesion, 90 %stenosed.  2nd Mrg lesion, 90 %stenosed.  Dist Cx lesion, 80 %stenosed.  LM lesion, 70 %stenosed.  Prox LAD lesion, 70 %stenosed.  Prox RCA-2 lesion, 95 %stenosed.  Prox RCA-1 lesion, 65 %stenosed.  Mid RCA lesion, 45 %stenosed.  Dist RCA lesion, 60 %stenosed.   Assessment/Plan  Active Problems:   Unstable angina (HCC)   1. UNSTABLE ANGINA/ Severe 3V CAD:  The patient has diffuse disease. He has left main stenosis. Lesions are not particularly amenable to PCI. CABG is felt to be the best option.  No angina .   Doing well  Dr. Prescott Gum is planning CABG on Friday    2. DM:  A1c 7.7 02/2016. BG was as high as 450 earlier today but improving. Most recent POC glucose was 234. Continue Insulin.   3. DYSLIPIDEMIA:  Refused statins in the past but he is willing to try Crestor at this point.  Lipid panel showed LDL of 110 mg/dL. Crestor 20 mg initiated. Repeat FLP + HFTs in 6 weeks. LDL target <70 mg/dL.   4. SLEEP APNEA:  The patient wants to use his home CPAP    LOS: 2 days      Mertie Moores, MD  06/26/2016 1:31 PM    [facility] [place],  [place],   [address] Pager [phone] Phone: [phone]; Fax: [phone]

## 2016-06-26 NOTE — Progress Notes (Signed)
CARDIAC REHAB PHASE I   PRE:  Rate/Rhythm: 75 SR  BP:  Sitting: 136/64        SaO2: 100 RA  MODE:  Ambulation: 500 ft   POST:  Rate/Rhythm: 75 SR  BP:  Sitting: 134/68         SaO2: 100 RA  Pt ambulated 500 ft on RA, IV, assist x1, slow, steady gait, tolerated fairly well. Pt c/o mild dizziness, improved with distance, denies chest pain, DOE, declined rest stop. Pt to recliner after walk, call bell within reach. Will follow post-op.  [id number] Lenna Sciara, RN, BSN 06/26/2016 10:41 AM

## 2016-06-27 ENCOUNTER — Inpatient Hospital Stay (HOSPITAL_COMMUNITY): Payer: Self-pay

## 2016-06-27 ENCOUNTER — Inpatient Hospital Stay (HOSPITAL_COMMUNITY)
Admission: AD | Disposition: A | Discharge status: Home / Self Care | Payer: Self-pay | Source: Other Acute Inpatient Hospital | Attending: Cardiothoracic Surgery

## 2016-06-27 ENCOUNTER — Inpatient Hospital Stay (HOSPITAL_COMMUNITY): Payer: Medicare Other | Admitting: Anesthesiology

## 2016-06-27 ENCOUNTER — Encounter (HOSPITAL_COMMUNITY): Payer: Self-pay

## 2016-06-27 ENCOUNTER — Inpatient Hospital Stay (HOSPITAL_COMMUNITY): Payer: Medicare Other

## 2016-06-27 DIAGNOSIS — Z951 Presence of aortocoronary bypass graft: Secondary | ICD-10-CM

## 2016-06-27 HISTORY — PX: CORONARY ARTERY BYPASS GRAFT: SHX141

## 2016-06-27 HISTORY — PX: TEE WITHOUT CARDIOVERSION: SHX5443

## 2016-06-27 HISTORY — DX: Presence of aortocoronary bypass graft: Z95.1

## 2016-06-27 LAB — HEPARIN LEVEL (UNFRACTIONATED): Heparin Unfractionated: 0.34 IU/mL (ref 0.30–0.70)

## 2016-06-27 LAB — POCT I-STAT, CHEM 8
BUN: 17 mg/dL (ref 6–20)
BUN: 16 mg/dL (ref 6–20)
BUN: 15 mg/dL (ref 6–20)
BUN: 14 mg/dL (ref 6–20)
BUN: 14 mg/dL (ref 6–20)
CALCIUM ION: 1.03 mmol/L — AB (ref 1.12–1.23)
CHLORIDE: 99 mmol/L — AB (ref 101–111)
CHLORIDE: 97 mmol/L — AB (ref 101–111)
CHLORIDE: 99 mmol/L — AB (ref 101–111)
CHLORIDE: 99 mmol/L — AB (ref 101–111)
CREATININE: 0.6 mg/dL — AB (ref 0.61–1.24)
CREATININE: 0.6 mg/dL — AB (ref 0.61–1.24)
Calcium, Ion: 1.2 mmol/L (ref 1.12–1.23)
Calcium, Ion: 1.2 mmol/L (ref 1.12–1.23)
Calcium, Ion: 1.07 mmol/L — ABNORMAL LOW (ref 1.12–1.23)
Calcium, Ion: 1.08 mmol/L — ABNORMAL LOW (ref 1.12–1.23)
Chloride: 98 mmol/L — ABNORMAL LOW (ref 101–111)
Creatinine, Ser: 0.7 mg/dL (ref 0.61–1.24)
Creatinine, Ser: 0.5 mg/dL — ABNORMAL LOW (ref 0.61–1.24)
Creatinine, Ser: 0.6 mg/dL — ABNORMAL LOW (ref 0.61–1.24)
GLUCOSE: 114 mg/dL — AB (ref 65–99)
GLUCOSE: 135 mg/dL — AB (ref 65–99)
Glucose, Bld: 130 mg/dL — ABNORMAL HIGH (ref 65–99)
Glucose, Bld: 103 mg/dL — ABNORMAL HIGH (ref 65–99)
Glucose, Bld: 196 mg/dL — ABNORMAL HIGH (ref 65–99)
HCT: 26 % — ABNORMAL LOW (ref 39.0–52.0)
HEMATOCRIT: 31 % — AB (ref 39.0–52.0)
HEMATOCRIT: 30 % — AB (ref 39.0–52.0)
HEMATOCRIT: 26 % — AB (ref 39.0–52.0)
HEMATOCRIT: 25 % — AB (ref 39.0–52.0)
HEMOGLOBIN: 10.5 g/dL — AB (ref 13.0–17.0)
HEMOGLOBIN: 10.2 g/dL — AB (ref 13.0–17.0)
Hemoglobin: 8.8 g/dL — ABNORMAL LOW (ref 13.0–17.0)
Hemoglobin: 8.8 g/dL — ABNORMAL LOW (ref 13.0–17.0)
Hemoglobin: 8.5 g/dL — ABNORMAL LOW (ref 13.0–17.0)
POTASSIUM: 3.8 mmol/L (ref 3.5–5.1)
POTASSIUM: 3.8 mmol/L (ref 3.5–5.1)
POTASSIUM: 4 mmol/L (ref 3.5–5.1)
POTASSIUM: 4 mmol/L (ref 3.5–5.1)
Potassium: 3.9 mmol/L (ref 3.5–5.1)
SODIUM: 139 mmol/L (ref 135–145)
SODIUM: 139 mmol/L (ref 135–145)
SODIUM: 140 mmol/L (ref 135–145)
SODIUM: 137 mmol/L (ref 135–145)
SODIUM: 137 mmol/L (ref 135–145)
TCO2: 32 mmol/L (ref 0–100)
TCO2: 30 mmol/L (ref 0–100)
TCO2: 31 mmol/L (ref 0–100)
TCO2: 30 mmol/L (ref 0–100)
TCO2: 28 mmol/L (ref 0–100)

## 2016-06-27 LAB — CBC
HCT: 35.8 % — ABNORMAL LOW (ref 39.0–52.0)
HEMATOCRIT: 28.6 % — AB (ref 39.0–52.0)
Hemoglobin: 11.8 g/dL — ABNORMAL LOW (ref 13.0–17.0)
Hemoglobin: 9.3 g/dL — ABNORMAL LOW (ref 13.0–17.0)
MCH: 30.3 pg (ref 26.0–34.0)
MCH: 29.8 pg (ref 26.0–34.0)
MCHC: 33 g/dL (ref 30.0–36.0)
MCHC: 32.5 g/dL (ref 30.0–36.0)
MCV: 92 fL (ref 78.0–100.0)
MCV: 91.7 fL (ref 78.0–100.0)
Platelets: 250 10*3/uL (ref 150–400)
Platelets: 183 10*3/uL (ref 150–400)
RBC: 3.89 MIL/uL — ABNORMAL LOW (ref 4.22–5.81)
RBC: 3.12 MIL/uL — ABNORMAL LOW (ref 4.22–5.81)
RDW: 15.4 % (ref 11.5–15.5)
RDW: 15 % (ref 11.5–15.5)
WBC: 7.5 10*3/uL (ref 4.0–10.5)
WBC: 10.8 10*3/uL — ABNORMAL HIGH (ref 4.0–10.5)

## 2016-06-27 LAB — POCT I-STAT 3, ART BLOOD GAS (G3+)
ACID-BASE EXCESS: 4 mmol/L — AB (ref 0.0–2.0)
Acid-Base Excess: 6 mmol/L — ABNORMAL HIGH (ref 0.0–2.0)
Acid-Base Excess: 2 mmol/L (ref 0.0–2.0)
Acid-Base Excess: 1 mmol/L (ref 0.0–2.0)
BICARBONATE: 29.2 meq/L — AB (ref 20.0–24.0)
Bicarbonate: 30.5 mEq/L — ABNORMAL HIGH (ref 20.0–24.0)
Bicarbonate: 27.1 mEq/L — ABNORMAL HIGH (ref 20.0–24.0)
Bicarbonate: 26.2 mEq/L — ABNORMAL HIGH (ref 20.0–24.0)
O2 SAT: 100 %
O2 Saturation: 100 %
O2 Saturation: 100 %
O2 Saturation: 97 %
PCO2 ART: 46.5 mmHg — AB (ref 35.0–45.0)
PCO2 ART: 44 mmHg (ref 35.0–45.0)
PCO2 ART: 45.6 mmHg — AB (ref 35.0–45.0)
PCO2 ART: 42.2 mmHg (ref 35.0–45.0)
PH ART: 7.448 (ref 7.350–7.450)
PO2 ART: 389 mmHg — AB (ref 80.0–100.0)
PO2 ART: 451 mmHg — AB (ref 80.0–100.0)
PO2 ART: 270 mmHg — AB (ref 80.0–100.0)
PO2 ART: 87 mmHg (ref 80.0–100.0)
Patient temperature: 36.1
TCO2: 31 mmol/L (ref 0–100)
TCO2: 32 mmol/L (ref 0–100)
TCO2: 28 mmol/L (ref 0–100)
TCO2: 28 mmol/L (ref 0–100)
pH, Arterial: 7.406 (ref 7.350–7.450)
pH, Arterial: 7.382 (ref 7.350–7.450)
pH, Arterial: 7.397 (ref 7.350–7.450)

## 2016-06-27 LAB — HEMOGLOBIN AND HEMATOCRIT, BLOOD
HCT: 25.5 % — ABNORMAL LOW (ref 39.0–52.0)
Hemoglobin: 8.6 g/dL — ABNORMAL LOW (ref 13.0–17.0)

## 2016-06-27 LAB — PROTIME-INR
INR: 1.29
Prothrombin Time: 16.2 seconds — ABNORMAL HIGH (ref 11.4–15.2)

## 2016-06-27 LAB — GLUCOSE, CAPILLARY
GLUCOSE-CAPILLARY: 118 mg/dL — AB (ref 65–99)
Glucose-Capillary: 126 mg/dL — ABNORMAL HIGH (ref 65–99)
Glucose-Capillary: 103 mg/dL — ABNORMAL HIGH (ref 65–99)
Glucose-Capillary: 111 mg/dL — ABNORMAL HIGH (ref 65–99)
Glucose-Capillary: 121 mg/dL — ABNORMAL HIGH (ref 65–99)
Glucose-Capillary: 109 mg/dL — ABNORMAL HIGH (ref 65–99)

## 2016-06-27 LAB — PLATELET COUNT: Platelets: 171 10*3/uL (ref 150–400)

## 2016-06-27 LAB — APTT: aPTT: 32 seconds (ref 24–36)

## 2016-06-27 SURGERY — CORONARY ARTERY BYPASS GRAFTING (CABG)
Anesthesia: General | Site: Chest | Laterality: Right

## 2016-06-27 MED ORDER — ACETAMINOPHEN 650 MG RE SUPP
650.0000 mg | Freq: Once | RECTAL | Status: AC
  Administered 2016-06-27: 650 mg via RECTAL

## 2016-06-27 MED ORDER — SODIUM CHLORIDE 0.9 % IV SOLN
20.0000 ug | INTRAVENOUS | Status: AC
  Administered 2016-06-27: 20 ug via INTRAVENOUS
  Filled 2016-06-27: qty 5

## 2016-06-27 MED ORDER — MAGNESIUM SULFATE 4 GM/100ML IV SOLN
4.0000 g | Freq: Once | INTRAVENOUS | Status: AC
  Administered 2016-06-27: 4 g via INTRAVENOUS
  Filled 2016-06-27: qty 100

## 2016-06-27 MED ORDER — MIDAZOLAM HCL 2 MG/2ML IJ SOLN
INTRAMUSCULAR | Status: AC
  Filled 2016-06-27: qty 2

## 2016-06-27 MED ORDER — SODIUM CHLORIDE 0.9 % IJ SOLN
INTRAMUSCULAR | Status: AC
  Filled 2016-06-27: qty 10

## 2016-06-27 MED ORDER — ROCURONIUM BROMIDE 10 MG/ML (PF) SYRINGE
PREFILLED_SYRINGE | INTRAVENOUS | Status: AC
  Filled 2016-06-27: qty 20

## 2016-06-27 MED ORDER — ORAL CARE MOUTH RINSE
15.0000 mL | Freq: Four times a day (QID) | OROMUCOSAL | Status: DC
  Administered 2016-06-28 – 2016-06-29 (×7): 15 mL via OROMUCOSAL

## 2016-06-27 MED ORDER — ROCURONIUM BROMIDE 10 MG/ML (PF) SYRINGE
PREFILLED_SYRINGE | INTRAVENOUS | Status: AC
  Filled 2016-06-27: qty 10

## 2016-06-27 MED ORDER — MIDAZOLAM HCL 5 MG/5ML IJ SOLN
INTRAMUSCULAR | Status: DC | PRN
  Administered 2016-06-27 (×2): 1 mg via INTRAVENOUS
  Administered 2016-06-27 (×2): 2 mg via INTRAVENOUS
  Administered 2016-06-27: 1 mg via INTRAVENOUS
  Administered 2016-06-27: 3 mg via INTRAVENOUS
  Administered 2016-06-27: 2 mg via INTRAVENOUS

## 2016-06-27 MED ORDER — ALBUMIN HUMAN 5 % IV SOLN
250.0000 mL | INTRAVENOUS | Status: AC | PRN
  Administered 2016-06-27 – 2016-06-28 (×3): 250 mL via INTRAVENOUS

## 2016-06-27 MED ORDER — FENTANYL CITRATE (PF) 100 MCG/2ML IJ SOLN
INTRAMUSCULAR | Status: DC | PRN
  Administered 2016-06-27: 50 ug via INTRAVENOUS
  Administered 2016-06-27: 900 ug via INTRAVENOUS
  Administered 2016-06-27: 250 ug via INTRAVENOUS
  Administered 2016-06-27: 50 ug via INTRAVENOUS
  Administered 2016-06-27: 250 ug via INTRAVENOUS

## 2016-06-27 MED ORDER — SODIUM CHLORIDE 0.9 % IV SOLN: INTRAVENOUS | Status: DC | PRN

## 2016-06-27 MED ORDER — INSULIN REGULAR BOLUS VIA INFUSION
0.0000 [IU] | Freq: Three times a day (TID) | INTRAVENOUS | Status: DC
  Administered 2016-06-28: 3 [IU] via INTRAVENOUS
  Filled 2016-06-27: qty 10

## 2016-06-27 MED ORDER — SODIUM CHLORIDE 0.9 % IV SOLN: Freq: Once | INTRAVENOUS | Status: DC

## 2016-06-27 MED ORDER — POTASSIUM CHLORIDE 10 MEQ/50ML IV SOLN
10.0000 meq | INTRAVENOUS | Status: AC
  Administered 2016-06-27: 10 meq via INTRAVENOUS

## 2016-06-27 MED ORDER — METOPROLOL TARTRATE 25 MG/10 ML ORAL SUSPENSION: 12.5000 mg | Freq: Two times a day (BID) | ORAL | Status: DC

## 2016-06-27 MED ORDER — HEPARIN SODIUM (PORCINE) 1000 UNIT/ML IJ SOLN
INTRAMUSCULAR | Status: DC | PRN
  Administered 2016-06-27: 44000 [IU] via INTRAVENOUS
  Administered 2016-06-27: 4000 [IU] via INTRAVENOUS

## 2016-06-27 MED ORDER — THROMBIN 5000 UNITS EX SOLR
CUTANEOUS | Status: AC
  Filled 2016-06-27: qty 5000

## 2016-06-27 MED ORDER — METOPROLOL TARTRATE 5 MG/5ML IV SOLN
2.5000 mg | INTRAVENOUS | Status: DC | PRN
  Filled 2016-06-27: qty 5

## 2016-06-27 MED ORDER — MIDAZOLAM HCL 10 MG/2ML IJ SOLN
INTRAMUSCULAR | Status: AC
  Filled 2016-06-27: qty 2

## 2016-06-27 MED ORDER — DEXTROSE 5 % IV SOLN
1.5000 g | Freq: Two times a day (BID) | INTRAVENOUS | Status: AC
  Administered 2016-06-27 – 2016-06-29 (×4): 1.5 g via INTRAVENOUS
  Filled 2016-06-27 (×4): qty 1.5

## 2016-06-27 MED ORDER — ACETAMINOPHEN 160 MG/5ML PO SOLN: 1000.0000 mg | Freq: Four times a day (QID) | ORAL | Status: AC

## 2016-06-27 MED ORDER — PROTAMINE SULFATE 10 MG/ML IV SOLN
INTRAVENOUS | Status: AC
  Filled 2016-06-27: qty 25

## 2016-06-27 MED ORDER — FENTANYL CITRATE (PF) 250 MCG/5ML IJ SOLN
INTRAMUSCULAR | Status: AC
  Filled 2016-06-27: qty 20

## 2016-06-27 MED ORDER — TRAMADOL HCL 50 MG PO TABS
50.0000 mg | ORAL_TABLET | ORAL | Status: DC | PRN
  Administered 2016-06-28 (×2): 100 mg via ORAL
  Filled 2016-06-27 (×2): qty 2

## 2016-06-27 MED ORDER — ASPIRIN 81 MG PO CHEW: 324.0000 mg | CHEWABLE_TABLET | Freq: Every day | ORAL | Status: DC

## 2016-06-27 MED ORDER — DEXMEDETOMIDINE HCL IN NACL 200 MCG/50ML IV SOLN
INTRAVENOUS | Status: DC | PRN
  Administered 2016-06-27: .2 ug/kg/h via INTRAVENOUS

## 2016-06-27 MED ORDER — DOPAMINE-DEXTROSE 3.2-5 MG/ML-% IV SOLN: 0.0000 ug/kg/min | INTRAVENOUS | Status: DC

## 2016-06-27 MED ORDER — PROPOFOL 10 MG/ML IV BOLUS
INTRAVENOUS | Status: DC | PRN
  Administered 2016-06-27: 50 mg via INTRAVENOUS

## 2016-06-27 MED ORDER — SODIUM CHLORIDE 0.9 % IV SOLN
0.5000 g/h | INTRAVENOUS | Status: DC
  Filled 2016-06-27: qty 20

## 2016-06-27 MED ORDER — LACTATED RINGERS IV SOLN
INTRAVENOUS | Status: DC | PRN
  Administered 2016-06-27 (×4): via INTRAVENOUS

## 2016-06-27 MED ORDER — METOPROLOL TARTRATE 12.5 MG HALF TABLET
12.5000 mg | ORAL_TABLET | Freq: Two times a day (BID) | ORAL | Status: DC
  Administered 2016-06-28 – 2016-06-30 (×6): 12.5 mg via ORAL
  Filled 2016-06-27 (×6): qty 1

## 2016-06-27 MED ORDER — HEMOSTATIC AGENTS (NO CHARGE) OPTIME
TOPICAL | Status: DC | PRN
  Administered 2016-06-27: 5 via TOPICAL

## 2016-06-27 MED ORDER — NITROGLYCERIN IN D5W 200-5 MCG/ML-% IV SOLN: 0.0000 ug/min | INTRAVENOUS | Status: DC

## 2016-06-27 MED ORDER — ACETAMINOPHEN 160 MG/5ML PO SOLN: 650.0000 mg | Freq: Once | ORAL | Status: AC

## 2016-06-27 MED ORDER — ONDANSETRON HCL 4 MG/2ML IJ SOLN
4.0000 mg | Freq: Four times a day (QID) | INTRAMUSCULAR | Status: DC | PRN
  Administered 2016-06-28 – 2016-06-29 (×2): 4 mg via INTRAVENOUS
  Filled 2016-06-27 (×2): qty 2

## 2016-06-27 MED ORDER — ACETAMINOPHEN 500 MG PO TABS
1000.0000 mg | ORAL_TABLET | Freq: Four times a day (QID) | ORAL | Status: AC
  Administered 2016-06-28 – 2016-07-02 (×19): 1000 mg via ORAL
  Filled 2016-06-27 (×18): qty 2

## 2016-06-27 MED ORDER — DEXMEDETOMIDINE HCL IN NACL 200 MCG/50ML IV SOLN
0.0000 ug/kg/h | INTRAVENOUS | Status: DC
  Filled 2016-06-27: qty 50

## 2016-06-27 MED ORDER — AMINOCAPROIC ACID 250 MG/ML IV SOLN: INTRAVENOUS | Status: DC | PRN

## 2016-06-27 MED ORDER — SODIUM CHLORIDE 0.9 % IV SOLN
INTRAVENOUS | Status: DC
  Administered 2016-06-27: 16:00:00 via INTRAVENOUS

## 2016-06-27 MED ORDER — SODIUM CHLORIDE 0.9% FLUSH
3.0000 mL | INTRAVENOUS | Status: DC | PRN
  Administered 2016-06-29: 3 mL via INTRAVENOUS
  Filled 2016-06-27: qty 3

## 2016-06-27 MED ORDER — SODIUM CHLORIDE 0.9 % IV SOLN
INTRAVENOUS | Status: DC
  Administered 2016-06-28: 01:00:00 via INTRAVENOUS
  Filled 2016-06-27 (×2): qty 2.5

## 2016-06-27 MED ORDER — LACTATED RINGERS IV SOLN: 500.0000 mL | Freq: Once | INTRAVENOUS | Status: DC | PRN

## 2016-06-27 MED ORDER — ARTIFICIAL TEARS OP OINT
TOPICAL_OINTMENT | OPHTHALMIC | Status: DC | PRN
  Administered 2016-06-27: 1 via OPHTHALMIC

## 2016-06-27 MED ORDER — VANCOMYCIN HCL IN DEXTROSE 1-5 GM/200ML-% IV SOLN
1000.0000 mg | Freq: Once | INTRAVENOUS | Status: AC
  Administered 2016-06-27: 1000 mg via INTRAVENOUS
  Filled 2016-06-27: qty 200

## 2016-06-27 MED ORDER — FENTANYL CITRATE (PF) 250 MCG/5ML IJ SOLN
INTRAMUSCULAR | Status: AC
  Filled 2016-06-27: qty 5

## 2016-06-27 MED ORDER — MIDAZOLAM HCL 2 MG/2ML IJ SOLN: 2.0000 mg | INTRAMUSCULAR | Status: DC | PRN

## 2016-06-27 MED ORDER — BISACODYL 10 MG RE SUPP: 10.0000 mg | Freq: Every day | RECTAL | Status: DC

## 2016-06-27 MED ORDER — FAMOTIDINE IN NACL 20-0.9 MG/50ML-% IV SOLN
20.0000 mg | Freq: Two times a day (BID) | INTRAVENOUS | Status: AC
  Administered 2016-06-27: 20 mg via INTRAVENOUS

## 2016-06-27 MED ORDER — PROPOFOL 10 MG/ML IV BOLUS
INTRAVENOUS | Status: AC
  Filled 2016-06-27: qty 20

## 2016-06-27 MED ORDER — BISACODYL 5 MG PO TBEC
10.0000 mg | DELAYED_RELEASE_TABLET | Freq: Every day | ORAL | Status: DC
  Administered 2016-06-28 – 2016-07-02 (×5): 10 mg via ORAL
  Filled 2016-06-27 (×6): qty 2

## 2016-06-27 MED ORDER — PHENYLEPHRINE HCL 10 MG/ML IJ SOLN
0.0000 ug/min | INTRAVENOUS | Status: DC
  Filled 2016-06-27 (×2): qty 2

## 2016-06-27 MED ORDER — DEXMEDETOMIDINE HCL IN NACL 200 MCG/50ML IV SOLN
INTRAVENOUS | Status: AC
  Filled 2016-06-27: qty 50

## 2016-06-27 MED ORDER — SODIUM CHLORIDE 0.45 % IV SOLN
INTRAVENOUS | Status: DC | PRN
  Administered 2016-06-27: 15:00:00 via INTRAVENOUS

## 2016-06-27 MED ORDER — MORPHINE SULFATE (PF) 2 MG/ML IV SOLN: 1.0000 mg | INTRAVENOUS | Status: AC | PRN

## 2016-06-27 MED ORDER — PANTOPRAZOLE SODIUM 40 MG PO TBEC
40.0000 mg | DELAYED_RELEASE_TABLET | Freq: Every day | ORAL | Status: DC
  Administered 2016-06-29 – 2016-07-03 (×5): 40 mg via ORAL
  Filled 2016-06-27 (×5): qty 1

## 2016-06-27 MED ORDER — PHENYLEPHRINE HCL 10 MG/ML IJ SOLN
INTRAMUSCULAR | Status: DC | PRN
  Administered 2016-06-27 (×2): 80 ug via INTRAVENOUS

## 2016-06-27 MED ORDER — ROCURONIUM BROMIDE 100 MG/10ML IV SOLN
INTRAVENOUS | Status: DC | PRN
  Administered 2016-06-27: 60 mg via INTRAVENOUS
  Administered 2016-06-27 (×5): 50 mg via INTRAVENOUS
  Administered 2016-06-27: 40 mg via INTRAVENOUS
  Administered 2016-06-27: 50 mg via INTRAVENOUS

## 2016-06-27 MED ORDER — SODIUM CHLORIDE 0.9% FLUSH
3.0000 mL | Freq: Two times a day (BID) | INTRAVENOUS | Status: DC
  Administered 2016-06-28: 3 mL via INTRAVENOUS
  Administered 2016-06-29: 10 mL via INTRAVENOUS
  Administered 2016-06-29 – 2016-07-02 (×6): 3 mL via INTRAVENOUS

## 2016-06-27 MED ORDER — ARTIFICIAL TEARS OP OINT
TOPICAL_OINTMENT | OPHTHALMIC | Status: AC
  Filled 2016-06-27: qty 3.5

## 2016-06-27 MED ORDER — ASPIRIN EC 325 MG PO TBEC
325.0000 mg | DELAYED_RELEASE_TABLET | Freq: Every day | ORAL | Status: DC
  Administered 2016-06-28 – 2016-07-03 (×6): 325 mg via ORAL
  Filled 2016-06-27 (×6): qty 1

## 2016-06-27 MED ORDER — 0.9 % SODIUM CHLORIDE (POUR BTL) OPTIME
TOPICAL | Status: DC | PRN
  Administered 2016-06-27: 4000 mL

## 2016-06-27 MED ORDER — PROTAMINE SULFATE 10 MG/ML IV SOLN
INTRAVENOUS | Status: DC | PRN
  Administered 2016-06-27: 380 mg via INTRAVENOUS

## 2016-06-27 MED ORDER — MORPHINE SULFATE (PF) 2 MG/ML IV SOLN
2.0000 mg | INTRAVENOUS | Status: DC | PRN
  Administered 2016-06-27: 4 mg via INTRAVENOUS
  Administered 2016-06-27 (×2): 2 mg via INTRAVENOUS
  Administered 2016-06-27 – 2016-06-29 (×11): 4 mg via INTRAVENOUS
  Administered 2016-06-30 – 2016-07-01 (×3): 2 mg via INTRAVENOUS
  Filled 2016-06-27 (×3): qty 2
  Filled 2016-06-27: qty 1
  Filled 2016-06-27 (×4): qty 2
  Filled 2016-06-27: qty 1
  Filled 2016-06-27 (×2): qty 2
  Filled 2016-06-27 (×2): qty 1
  Filled 2016-06-27: qty 2
  Filled 2016-06-27 (×2): qty 1
  Filled 2016-06-27: qty 2
  Filled 2016-06-27: qty 1

## 2016-06-27 MED ORDER — CHLORHEXIDINE GLUCONATE 0.12 % MT SOLN
15.0000 mL | Freq: Two times a day (BID) | OROMUCOSAL | Status: DC
  Administered 2016-06-28 – 2016-06-29 (×3): 15 mL via OROMUCOSAL
  Filled 2016-06-27 (×3): qty 15

## 2016-06-27 MED ORDER — CHLORHEXIDINE GLUCONATE 0.12 % MT SOLN
15.0000 mL | OROMUCOSAL | Status: AC
  Administered 2016-06-27: 15 mL via OROMUCOSAL

## 2016-06-27 MED ORDER — SODIUM CHLORIDE 0.9 % IV SOLN: 250.0000 mL | INTRAVENOUS | Status: DC

## 2016-06-27 MED ORDER — SODIUM CHLORIDE 0.9 % IV SOLN
INTRAVENOUS | Status: DC | PRN
  Administered 2016-06-27: 5 g/h via INTRAVENOUS

## 2016-06-27 MED ORDER — OXYCODONE HCL 5 MG PO TABS
5.0000 mg | ORAL_TABLET | ORAL | Status: DC | PRN
  Administered 2016-06-28 (×4): 10 mg via ORAL
  Administered 2016-06-28: 5 mg via ORAL
  Administered 2016-06-28 – 2016-06-30 (×8): 10 mg via ORAL
  Administered 2016-06-30: 5 mg via ORAL
  Administered 2016-06-30 (×3): 10 mg via ORAL
  Administered 2016-07-01 (×3): 5 mg via ORAL
  Administered 2016-07-02 (×2): 10 mg via ORAL
  Administered 2016-07-02: 5 mg via ORAL
  Administered 2016-07-02 – 2016-07-03 (×4): 10 mg via ORAL
  Filled 2016-06-27 (×6): qty 2
  Filled 2016-06-27: qty 1
  Filled 2016-06-27 (×3): qty 2
  Filled 2016-06-27 (×2): qty 1
  Filled 2016-06-27 (×9): qty 2
  Filled 2016-06-27: qty 1
  Filled 2016-06-27: qty 2
  Filled 2016-06-27: qty 1
  Filled 2016-06-27 (×4): qty 2

## 2016-06-27 MED ORDER — LACTATED RINGERS IV SOLN: INTRAVENOUS | Status: DC

## 2016-06-27 MED ORDER — DOCUSATE SODIUM 100 MG PO CAPS
200.0000 mg | ORAL_CAPSULE | Freq: Every day | ORAL | Status: DC
  Administered 2016-06-28 – 2016-07-02 (×5): 200 mg via ORAL
  Filled 2016-06-27 (×6): qty 2

## 2016-06-27 MED ORDER — DEXTROSE 5 % IV SOLN
INTRAVENOUS | Status: DC | PRN
  Administered 2016-06-27: 20 ug/min via INTRAVENOUS

## 2016-06-27 MED ORDER — THROMBIN 5000 UNITS EX SOLR
CUTANEOUS | Status: DC | PRN
  Administered 2016-06-27: 5000 [IU] via TOPICAL

## 2016-06-27 SURGICAL SUPPLY — 114 items
ADAPTER CARDIO PERF ANTE/RETRO (ADAPTER) ×5 IMPLANT
APPLIER CLIP 9.375 SM OPEN (CLIP) ×5
BAG DECANTER FOR FLEXI CONT (MISCELLANEOUS) ×5 IMPLANT
BANDAGE ACE 4X5 VEL STRL LF (GAUZE/BANDAGES/DRESSINGS) ×5 IMPLANT
BANDAGE ACE 6X5 VEL STRL LF (GAUZE/BANDAGES/DRESSINGS) ×5 IMPLANT
BASKET HEART  (ORDER IN 25'S) (MISCELLANEOUS) ×1
BASKET HEART (ORDER IN 25'S) (MISCELLANEOUS) ×1
BASKET HEART (ORDER IN 25S) (MISCELLANEOUS) ×3 IMPLANT
BLADE STERNUM SYSTEM 6 (BLADE) ×5 IMPLANT
BLADE SURG 12 STRL SS (BLADE) ×5 IMPLANT
BLADE SURG 15 STRL LF DISP TIS (BLADE) ×3 IMPLANT
BLADE SURG 15 STRL SS (BLADE) ×2
BLADE SURG ROTATE 9660 (MISCELLANEOUS) ×5 IMPLANT
BNDG GAUZE ELAST 4 BULKY (GAUZE/BANDAGES/DRESSINGS) ×5 IMPLANT
CANISTER SUCTION 2500CC (MISCELLANEOUS) ×5 IMPLANT
CANNULA ARTERIAL NVNT 3/8 22FR (MISCELLANEOUS) ×5 IMPLANT
CANNULA GUNDRY RCSP 15FR (MISCELLANEOUS) ×5 IMPLANT
CATH CPB KIT VANTRIGT (MISCELLANEOUS) ×5 IMPLANT
CATH ROBINSON RED A/P 18FR (CATHETERS) ×15 IMPLANT
CATH THORACIC 36FR RT ANG (CATHETERS) ×5 IMPLANT
CLIP APPLIE 9.375 SM OPEN (CLIP) ×3 IMPLANT
CLIP TI MEDIUM 24 (CLIP) IMPLANT
CLIP TI WIDE RED SMALL 24 (CLIP) IMPLANT
COVER MAYO STAND STRL (DRAPES) ×5 IMPLANT
CRADLE DONUT ADULT HEAD (MISCELLANEOUS) ×5 IMPLANT
DRAIN CHANNEL 32F RND 10.7 FF (WOUND CARE) ×5 IMPLANT
DRAPE CARDIOVASCULAR INCISE (DRAPES) ×2
DRAPE EXTREMITY T 121X128X90 (DRAPE) ×5 IMPLANT
DRAPE PROXIMA HALF (DRAPES) IMPLANT
DRAPE SLUSH/WARMER DISC (DRAPES) ×5 IMPLANT
DRAPE SRG 135X102X78XABS (DRAPES) ×3 IMPLANT
DRSG AQUACEL AG ADV 3.5X10 (GAUZE/BANDAGES/DRESSINGS) ×5 IMPLANT
DRSG AQUACEL AG ADV 3.5X14 (GAUZE/BANDAGES/DRESSINGS) ×5 IMPLANT
ELECT BLADE 4.0 EZ CLEAN MEGAD (MISCELLANEOUS) ×5
ELECT BLADE 6.5 EXT (BLADE) ×5 IMPLANT
ELECT CAUTERY BLADE 6.4 (BLADE) ×5 IMPLANT
ELECT REM PT RETURN 9FT ADLT (ELECTROSURGICAL) ×10
ELECTRODE BLDE 4.0 EZ CLN MEGD (MISCELLANEOUS) ×3 IMPLANT
ELECTRODE REM PT RTRN 9FT ADLT (ELECTROSURGICAL) ×6 IMPLANT
FELT TEFLON 1X6 (MISCELLANEOUS) ×10 IMPLANT
GAUZE SPONGE 4X4 12PLY STRL (GAUZE/BANDAGES/DRESSINGS) ×10 IMPLANT
GEL ULTRASOUND 20GR AQUASONIC (MISCELLANEOUS) ×5 IMPLANT
GLOVE BIO SURGEON STRL SZ7.5 (GLOVE) ×25 IMPLANT
GLOVE BIO SURGEON STRL SZ8.5 (GLOVE) ×5 IMPLANT
GLOVE BIOGEL PI IND STRL 6 (GLOVE) ×12 IMPLANT
GLOVE BIOGEL PI IND STRL 6.5 (GLOVE) ×12 IMPLANT
GLOVE BIOGEL PI IND STRL 7.0 (GLOVE) ×6 IMPLANT
GLOVE BIOGEL PI INDICATOR 6 (GLOVE) ×8
GLOVE BIOGEL PI INDICATOR 6.5 (GLOVE) ×8
GLOVE BIOGEL PI INDICATOR 7.0 (GLOVE) ×4
GLOVE SS BIOGEL STRL SZ 7.5 (GLOVE) ×3 IMPLANT
GLOVE SUPERSENSE BIOGEL SZ 7.5 (GLOVE) ×2
GOWN STRL REUS W/ TWL LRG LVL3 (GOWN DISPOSABLE) ×21 IMPLANT
GOWN STRL REUS W/TWL LRG LVL3 (GOWN DISPOSABLE) ×14
HARMONIC SHEARS 14CM COAG (MISCELLANEOUS) ×5 IMPLANT
HEMOSTAT POWDER SURGIFOAM 1G (HEMOSTASIS) ×25 IMPLANT
HEMOSTAT SURGICEL 2X14 (HEMOSTASIS) ×5 IMPLANT
INSERT FOGARTY XLG (MISCELLANEOUS) ×5 IMPLANT
KIT BASIN OR (CUSTOM PROCEDURE TRAY) ×5 IMPLANT
KIT ROOM TURNOVER OR (KITS) ×5 IMPLANT
KIT SUCTION CATH 14FR (SUCTIONS) ×5 IMPLANT
KIT VASOVIEW 6 PRO VH 2400 (KITS) ×10 IMPLANT
LEAD PACING MYOCARDI (MISCELLANEOUS) ×5 IMPLANT
MARKER GRAFT CORONARY BYPASS (MISCELLANEOUS) ×15 IMPLANT
NS IRRIG 1000ML POUR BTL (IV SOLUTION) ×25 IMPLANT
PACK OPEN HEART (CUSTOM PROCEDURE TRAY) ×5 IMPLANT
PAD ARMBOARD 7.5X6 YLW CONV (MISCELLANEOUS) ×10 IMPLANT
PAD ELECT DEFIB RADIOL ZOLL (MISCELLANEOUS) ×5 IMPLANT
PENCIL BUTTON HOLSTER BLD 10FT (ELECTRODE) ×5 IMPLANT
PUNCH AORTIC ROTATE 4.0MM (MISCELLANEOUS) IMPLANT
PUNCH AORTIC ROTATE 4.5MM 8IN (MISCELLANEOUS) ×5 IMPLANT
PUNCH AORTIC ROTATE 5MM 8IN (MISCELLANEOUS) IMPLANT
SET CARDIOPLEGIA MPS 5001102 (MISCELLANEOUS) ×5 IMPLANT
SPOGE SURGIFLO 8M (HEMOSTASIS) ×2
SPONGE GAUZE 4X4 12PLY STER LF (GAUZE/BANDAGES/DRESSINGS) ×5 IMPLANT
SPONGE LAP 4X18 X RAY DECT (DISPOSABLE) ×5 IMPLANT
SPONGE SURGIFLO 8M (HEMOSTASIS) ×3 IMPLANT
SURGIFLO W/THROMBIN 8M KIT (HEMOSTASIS) ×5 IMPLANT
SUT BONE WAX W31G (SUTURE) ×5 IMPLANT
SUT MNCRL AB 4-0 PS2 18 (SUTURE) IMPLANT
SUT PROLENE 3 0 SH DA (SUTURE) IMPLANT
SUT PROLENE 3 0 SH1 36 (SUTURE) IMPLANT
SUT PROLENE 4 0 RB 1 (SUTURE) ×2
SUT PROLENE 4 0 SH DA (SUTURE) ×5 IMPLANT
SUT PROLENE 4-0 RB1 .5 CRCL 36 (SUTURE) ×3 IMPLANT
SUT PROLENE 5 0 C 1 36 (SUTURE) IMPLANT
SUT PROLENE 6 0 C 1 30 (SUTURE) ×20 IMPLANT
SUT PROLENE 6 0 CC (SUTURE) ×20 IMPLANT
SUT PROLENE 8 0 BV175 6 (SUTURE) ×20 IMPLANT
SUT PROLENE BLUE 7 0 (SUTURE) ×10 IMPLANT
SUT SILK  1 MH (SUTURE)
SUT SILK 1 MH (SUTURE) IMPLANT
SUT SILK 2 0 SH CR/8 (SUTURE) IMPLANT
SUT SILK 3 0 SH CR/8 (SUTURE) IMPLANT
SUT SILK 4 0 TIE 10X30 (SUTURE) ×5 IMPLANT
SUT STEEL 6MS V (SUTURE) ×10 IMPLANT
SUT STEEL SZ 6 DBL 3X14 BALL (SUTURE) ×15 IMPLANT
SUT VIC AB 1 CTX 36 (SUTURE) ×4
SUT VIC AB 1 CTX36XBRD ANBCTR (SUTURE) ×6 IMPLANT
SUT VIC AB 2-0 CT1 27 (SUTURE) ×2
SUT VIC AB 2-0 CT1 TAPERPNT 27 (SUTURE) ×3 IMPLANT
SUT VIC AB 2-0 CTX 27 (SUTURE) IMPLANT
SUT VIC AB 3-0 SH 27 (SUTURE)
SUT VIC AB 3-0 SH 27X BRD (SUTURE) IMPLANT
SUT VIC AB 3-0 X1 27 (SUTURE) ×20 IMPLANT
SUTURE E-PAK OPEN HEART (SUTURE) ×5 IMPLANT
SYR 50ML SLIP (SYRINGE) IMPLANT
SYSTEM SAHARA CHEST DRAIN ATS (WOUND CARE) ×5 IMPLANT
TOWEL OR 17X24 6PK STRL BLUE (TOWEL DISPOSABLE) ×10 IMPLANT
TOWEL OR 17X26 10 PK STRL BLUE (TOWEL DISPOSABLE) ×10 IMPLANT
TRAY FOLEY IC TEMP SENS 16FR (CATHETERS) ×5 IMPLANT
TUBING INSUFFLATION (TUBING) ×5 IMPLANT
UNDERPAD 30X30 (UNDERPADS AND DIAPERS) ×5 IMPLANT
WATER STERILE IRR 1000ML POUR (IV SOLUTION) ×10 IMPLANT

## 2016-06-27 NOTE — Anesthesia Postprocedure Evaluation (Signed)
Anesthesia Post Note  Patient: Lawrence [name]  Procedure(s) Performed: Procedure(s) (LRB): CORONARY ARTERY BYPASS GRAFTING times four using left internal mammary artery and right leg saphenous vein (N/A) TRANSESOPHAGEAL ECHOCARDIOGRAM (TEE) (N/A)  Patient location during evaluation: SICU Anesthesia Type: General Level of consciousness: sedated Pain management: pain level controlled Vital Signs Assessment: post-procedure vital signs reviewed and stable Respiratory status: patient remains intubated per anesthesia plan Cardiovascular status: stable Anesthetic complications: no    Last Vitals:  Vitals:   06/27/16 1730 06/27/16 1745  BP:    Pulse: 80 80  Resp: 12 12  Temp: 36.3 C 36.3 C    Last Pain:  Vitals:   06/27/16 0505  TempSrc: Oral  PainSc:                  Kimani Bedoya DAVID

## 2016-06-27 NOTE — Transfer of Care (Signed)
Immediate Anesthesia Transfer of Care Note  Patient: Lawrence [name]  Procedure(s) Performed: Procedure(s): CORONARY ARTERY BYPASS GRAFTING times four using left internal mammary artery and right leg saphenous vein (N/A) TRANSESOPHAGEAL ECHOCARDIOGRAM (TEE) (N/A)  Patient Location: SICU  Anesthesia Type:General  Level of Consciousness: sedated, unresponsive and Patient remains intubated per anesthesia plan  Airway & Oxygen Therapy: Patient remains intubated per anesthesia plan and Patient placed on Ventilator (see vital sign flow sheet for setting)  Post-op Assessment: Report given to RN and Post -op Vital signs reviewed and stable  Post vital signs: Reviewed and stable  Last Vitals:  Vitals:   06/26/16 2051 06/27/16 0505  BP: (!) 165/62 (!) 133/54  Pulse: 79 70  Resp: 19 17  Temp: 36.7 C 36.6 C    Last Pain:  Vitals:   06/27/16 0505  TempSrc: Oral  PainSc:          Complications: No apparent anesthesia complications

## 2016-06-27 NOTE — Progress Notes (Signed)
Patient ID: Lawrence [name], male   DOB: 1951/04/01, 65 y.o.   MRN: [id number]   SICU Evening Rounds:   Hemodynamically stable  CI = 2.6 on 1.5 mcg dopamine  Still on vent. Weaning Precedex  Urine output good  CT output low  CBC    Component Value Date/Time   WBC 10.8 (H) 06/27/2016 1520   RBC 3.12 (L) 06/27/2016 1520   HGB 9.3 (L) 06/27/2016 1520   HGB 12.8 (L) 10/12/2013 0515   HCT 28.6 (L) 06/27/2016 1520   HCT 36.9 (L) 10/12/2013 0515   PLT 183 06/27/2016 1520   PLT 215 10/12/2013 0515   MCV 91.7 06/27/2016 1520   MCV 95 10/12/2013 0515   MCH 29.8 06/27/2016 1520   MCHC 32.5 06/27/2016 1520   RDW 15.0 06/27/2016 1520   RDW 13.1 10/12/2013 0515   LYMPHSABS 1.7 06/23/2016 1955   LYMPHSABS 2.3 10/12/2013 0515   MONOABS 0.6 06/23/2016 1955   MONOABS 0.7 10/12/2013 0515   EOSABS 0.1 06/23/2016 1955   EOSABS 0.2 10/12/2013 0515   BASOSABS 0.0 06/23/2016 1955   BASOSABS 0.0 10/12/2013 0515     BMET    Component Value Date/Time   NA 137 06/27/2016 1353   NA 137 10/12/2013 0515   K 3.9 06/27/2016 1353   K 3.9 10/12/2013 0515   CL 99 (L) 06/27/2016 1353   CL 101 10/12/2013 0515   CO2 25 06/24/2016 0416   CO2 31 10/12/2013 0515   GLUCOSE 196 (H) 06/27/2016 1353   GLUCOSE 218 (H) 10/12/2013 0515   BUN 14 06/27/2016 1353   BUN 20 (H) 10/12/2013 0515   CREATININE 0.60 (L) 06/27/2016 1353   CREATININE 0.99 10/12/2013 0515   CALCIUM 8.9 06/24/2016 0416   CALCIUM 8.8 10/12/2013 0515   GFRNONAA >60 06/24/2016 0416   GFRNONAA >60 10/12/2013 0515   GFRAA >60 06/24/2016 0416   GFRAA >60 10/12/2013 0515     A/P:  Stable postop course. Continue current plans. Wean vent as tolerated.

## 2016-06-27 NOTE — Anesthesia Procedure Notes (Signed)
Procedure Name: Intubation Date/Time: 06/27/2016 7:39 AM Performed by: Gaylene Brooks Pre-anesthesia Checklist: Patient identified, Emergency Drugs available, Suction available and Patient being monitored Patient Re-evaluated:Patient Re-evaluated prior to inductionOxygen Delivery Method: Circle System Utilized Preoxygenation: Pre-oxygenation with 100% oxygen Intubation Type: IV induction Ventilation: Mask ventilation without difficulty and Oral airway inserted - appropriate to patient size Laryngoscope Size: Mac and 4 Grade View: Grade II Tube type: Oral Tube size: 8.0 mm Number of attempts: 1 Airway Equipment and Method: Stylet and Oral airway Placement Confirmation: ETT inserted through vocal cords under direct vision,  positive ETCO2 and breath sounds checked- equal and bilateral Secured at: 24 cm Tube secured with: Tape Dental Injury: Teeth and Oropharynx as per pre-operative assessment

## 2016-06-27 NOTE — Progress Notes (Signed)
Echocardiogram Echocardiogram Transesophageal has been performed.  Joelene Millin 06/27/2016, 8:59 AM

## 2016-06-27 NOTE — Brief Op Note (Signed)
06/24/2016 - 06/27/2016  12:48 PM      [address]       [place] [address]             [phone]     06/24/2016 - 06/27/2016  12:49 PM  PATIENT:  [name]  65 y.o. male  PRE-OPERATIVE DIAGNOSIS:  Coronary Artery Disease  POST-OPERATIVE DIAGNOSIS:  Coronary Artery Disease  PROCEDURE:  Procedure(s): CORONARY ARTERY BYPASS GRAFTING times four using left internal mammary artery and right leg saphenous vein LIMA-LAD; SVG-RCA; SEQ SVG-OM1-OM2 EVH RIGHT THIGH TRANSESOPHAGEAL ECHOCARDIOGRAM (TEE)  SURGEON:  Surgeon(s): Ivin Poot, MD  PHYSICIAN ASSISTANT: Ashe Graybeal PA-C  ANESTHESIA:   general  PATIENT CONDITION:  ICU - intubated and hemodynamically stable.  PRE-OPERATIVE WEIGHT: [id number]  COMPLICATIONS; NO KNOWN

## 2016-06-27 NOTE — Progress Notes (Signed)
The patient was examined and preop studies reviewed. There has been no change from the prior exam and the patient is ready for surgery.  plan CABG with R radial artery harvest on G [place]

## 2016-06-27 NOTE — Procedures (Signed)
Extubation Procedure Note  Patient Details:   Name: STEN BASCH DOB: [birth year] MRN: [id number]   Airway Documentation:  Airway 8 mm (Active)  Secured at (cm) 24 cm 06/27/2016  3:05 PM  Measured From Lips 06/27/2016  3:05 PM  Secured Location Right 06/27/2016  3:05 PM  Secured By Rana Snare Tape 06/27/2016  3:05 PM    Evaluation  O2 sats: stable throughout Complications: No apparent complications Patient did tolerate procedure well. Bilateral Breath Sounds: Clear   Yes   Pt extubated to 4L [place] with no complications. Pt able to vocalize post extubation.  Weldon Inches 06/27/2016, 7:55 PM

## 2016-06-27 NOTE — Anesthesia Procedure Notes (Signed)
Central Venous Catheter Insertion Performed by: anesthesiologist 06/27/2016 6:44 AM Patient location: Pre-op. Preanesthetic checklist: patient identified, IV checked, site marked, risks and benefits discussed, surgical consent, monitors and equipment checked, pre-op evaluation, timeout performed and anesthesia consent Position: Trendelenburg Lidocaine 1% used for infiltration Landmarks identified and Seldinger technique used Catheter size: 9 Fr PA cath and Central line was placed.MAC introducer Swan type and PA catheter depth:thermodilutionProcedure performed using ultrasound guided technique. Attempts: 1 Following insertion, line sutured and dressing applied. Post procedure assessment: blood return through all ports, free fluid flow and no air. Patient tolerated the procedure well with no immediate complications. Additional procedure comments: PA catheter:  Routine monitors. Timeout, sterile prep, drape, FBP R neck.  Trendelenburg position.  1% Lido local, finder and trocar RIJ 1st pass with US guidance.  Double lumen Cordis placed over J wire. PA catheter in easily.  Sterile dressing applied.  Patient tolerated well, VSS.  Jenita Seashore, MD.

## 2016-06-27 NOTE — Progress Notes (Signed)
NIF: -28 and VC: 1L with good effort.

## 2016-06-27 NOTE — Anesthesia Preprocedure Evaluation (Addendum)
Anesthesia Evaluation  Patient identified by MRN, date of birth, ID band Patient awake    Reviewed: Allergy & Precautions, NPO status , Patient's Chart, lab work & pertinent test results  Airway Mallampati: II  TM Distance: >3 FB Neck ROM: Full    Dental  (+) Missing, Poor Dentition, Dental Advisory Given   Pulmonary sleep apnea , former smoker,    Pulmonary exam normal        Cardiovascular hypertension, + CAD and + Past MI  Normal cardiovascular exam     Neuro/Psych    GI/Hepatic GERD  Medicated and Controlled,  Endo/Other  diabetes  Renal/GU      Musculoskeletal   Abdominal   Peds  Hematology   Anesthesia Other Findings   Reproductive/Obstetrics                            Anesthesia Physical Anesthesia Plan  ASA: III  Anesthesia Plan: General   Post-op Pain Management:    Induction: Intravenous  Airway Management Planned: Oral ETT  Additional Equipment: Arterial line, CVP, PA Cath, TEE and Ultrasound Guidance Line Placement  Intra-op Plan:   Post-operative Plan: Post-operative intubation/ventilation  Informed Consent: I have reviewed the patients History and Physical, chart, labs and discussed the procedure including the risks, benefits and alternatives for the proposed anesthesia with the patient or authorized representative who has indicated his/her understanding and acceptance.     Plan Discussed with: CRNA and Surgeon  Anesthesia Plan Comments:         Anesthesia Quick Evaluation

## 2016-06-27 NOTE — OR Nursing (Signed)
SICU charge nurse calls 1. [phone]                                          2. [phone]                                          On way to sicu   [phone]

## 2016-06-28 ENCOUNTER — Inpatient Hospital Stay (HOSPITAL_COMMUNITY): Payer: Medicare Other

## 2016-06-28 LAB — POCT I-STAT, CHEM 8
BUN: 13 mg/dL (ref 6–20)
BUN: 16 mg/dL (ref 6–20)
CALCIUM ION: 1.14 mmol/L (ref 1.12–1.23)
CALCIUM ION: 1.12 mmol/L (ref 1.12–1.23)
CHLORIDE: 102 mmol/L (ref 101–111)
CHLORIDE: 97 mmol/L — AB (ref 101–111)
CREATININE: 0.9 mg/dL (ref 0.61–1.24)
Creatinine, Ser: 0.7 mg/dL (ref 0.61–1.24)
GLUCOSE: 200 mg/dL — AB (ref 65–99)
Glucose, Bld: 112 mg/dL — ABNORMAL HIGH (ref 65–99)
HCT: 28 % — ABNORMAL LOW (ref 39.0–52.0)
HCT: 25 % — ABNORMAL LOW (ref 39.0–52.0)
HEMOGLOBIN: 9.5 g/dL — AB (ref 13.0–17.0)
Hemoglobin: 8.5 g/dL — ABNORMAL LOW (ref 13.0–17.0)
POTASSIUM: 4 mmol/L (ref 3.5–5.1)
Potassium: 4.4 mmol/L (ref 3.5–5.1)
SODIUM: 140 mmol/L (ref 135–145)
Sodium: 135 mmol/L (ref 135–145)
TCO2: 26 mmol/L (ref 0–100)
TCO2: 26 mmol/L (ref 0–100)

## 2016-06-28 LAB — BASIC METABOLIC PANEL
ANION GAP: 7 (ref 5–15)
BUN: 13 mg/dL (ref 6–20)
CHLORIDE: 106 mmol/L (ref 101–111)
CO2: 25 mmol/L (ref 22–32)
Calcium: 8.1 mg/dL — ABNORMAL LOW (ref 8.9–10.3)
Creatinine, Ser: 0.99 mg/dL (ref 0.61–1.24)
GFR calc Af Amer: >60 mL/min (ref >60)
GFR calc non Af Amer: >60 mL/min (ref >60)
GLUCOSE: 119 mg/dL — AB (ref 65–99)
POTASSIUM: 3.8 mmol/L (ref 3.5–5.1)
Sodium: 138 mmol/L (ref 135–145)

## 2016-06-28 LAB — GLUCOSE, CAPILLARY
GLUCOSE-CAPILLARY: 114 mg/dL — AB (ref 65–99)
GLUCOSE-CAPILLARY: 138 mg/dL — AB (ref 65–99)
GLUCOSE-CAPILLARY: 165 mg/dL — AB (ref 65–99)
GLUCOSE-CAPILLARY: 167 mg/dL — AB (ref 65–99)
GLUCOSE-CAPILLARY: 191 mg/dL — AB (ref 65–99)
GLUCOSE-CAPILLARY: 71 mg/dL (ref 65–99)
Glucose-Capillary: 100 mg/dL — ABNORMAL HIGH (ref 65–99)
Glucose-Capillary: 117 mg/dL — ABNORMAL HIGH (ref 65–99)
Glucose-Capillary: 120 mg/dL — ABNORMAL HIGH (ref 65–99)
Glucose-Capillary: 123 mg/dL — ABNORMAL HIGH (ref 65–99)
Glucose-Capillary: 161 mg/dL — ABNORMAL HIGH (ref 65–99)
Glucose-Capillary: 171 mg/dL — ABNORMAL HIGH (ref 65–99)
Glucose-Capillary: 209 mg/dL — ABNORMAL HIGH (ref 65–99)
Glucose-Capillary: 323 mg/dL — ABNORMAL HIGH (ref 65–99)
Glucose-Capillary: 98 mg/dL (ref 65–99)

## 2016-06-28 LAB — CBC
HEMATOCRIT: 27.9 % — AB (ref 39.0–52.0)
HEMATOCRIT: 25.6 % — AB (ref 39.0–52.0)
HEMOGLOBIN: 8.4 g/dL — AB (ref 13.0–17.0)
Hemoglobin: 9 g/dL — ABNORMAL LOW (ref 13.0–17.0)
MCH: 29.9 pg (ref 26.0–34.0)
MCH: 30.8 pg (ref 26.0–34.0)
MCHC: 32.3 g/dL (ref 30.0–36.0)
MCHC: 32.8 g/dL (ref 30.0–36.0)
MCV: 92.7 fL (ref 78.0–100.0)
MCV: 93.8 fL (ref 78.0–100.0)
Platelets: 208 10*3/uL (ref 150–400)
Platelets: 167 10*3/uL (ref 150–400)
RBC: 3.01 MIL/uL — AB (ref 4.22–5.81)
RBC: 2.73 MIL/uL — AB (ref 4.22–5.81)
RDW: 15.6 % — AB (ref 11.5–15.5)
RDW: 16.2 % — ABNORMAL HIGH (ref 11.5–15.5)
WBC: 11 10*3/uL — AB (ref 4.0–10.5)
WBC: 11.6 10*3/uL — AB (ref 4.0–10.5)

## 2016-06-28 LAB — MAGNESIUM
Magnesium: 2.4 mg/dL (ref 1.7–2.4)
Magnesium: 2.2 mg/dL (ref 1.7–2.4)

## 2016-06-28 LAB — PREPARE PLATELET PHERESIS: UNIT DIVISION: 0

## 2016-06-28 LAB — POCT I-STAT 3, ART BLOOD GAS (G3+)
Acid-Base Excess: 1 mmol/L (ref 0.0–2.0)
Acid-Base Excess: 2 mmol/L (ref 0.0–2.0)
BICARBONATE: 27.3 meq/L — AB (ref 20.0–24.0)
Bicarbonate: 26.8 mEq/L — ABNORMAL HIGH (ref 20.0–24.0)
O2 Saturation: 97 %
O2 Saturation: 96 %
PCO2 ART: 46.1 mmHg — AB (ref 35.0–45.0)
PCO2 ART: 45.2 mmHg — AB (ref 35.0–45.0)
PH ART: 7.372 (ref 7.350–7.450)
PH ART: 7.39 (ref 7.350–7.450)
Patient temperature: 37.2
TCO2: 28 mmol/L (ref 0–100)
TCO2: 29 mmol/L (ref 0–100)
pO2, Arterial: 89 mmHg (ref 80.0–100.0)
pO2, Arterial: 81 mmHg (ref 80.0–100.0)

## 2016-06-28 LAB — PREPARE FRESH FROZEN PLASMA
UNIT DIVISION: 0
Unit division: 0

## 2016-06-28 LAB — CREATININE, SERUM
Creatinine, Ser: 1.12 mg/dL (ref 0.61–1.24)
GFR calc non Af Amer: >60 mL/min (ref >60)

## 2016-06-28 MED ORDER — FUROSEMIDE 10 MG/ML IJ SOLN
40.0000 mg | Freq: Once | INTRAMUSCULAR | Status: AC
  Administered 2016-06-28: 40 mg via INTRAVENOUS
  Filled 2016-06-28: qty 4

## 2016-06-28 MED ORDER — INSULIN DETEMIR 100 UNIT/ML ~~LOC~~ SOLN
15.0000 [IU] | Freq: Every day | SUBCUTANEOUS | Status: DC
  Filled 2016-06-28: qty 0.15

## 2016-06-28 MED ORDER — POTASSIUM CHLORIDE 10 MEQ/50ML IV SOLN
10.0000 meq | INTRAVENOUS | Status: AC
  Administered 2016-06-28 (×3): 10 meq via INTRAVENOUS

## 2016-06-28 MED ORDER — ENOXAPARIN SODIUM 40 MG/0.4ML ~~LOC~~ SOLN
40.0000 mg | Freq: Every day | SUBCUTANEOUS | Status: DC
  Administered 2016-06-28 – 2016-07-02 (×5): 40 mg via SUBCUTANEOUS
  Filled 2016-06-28 (×5): qty 0.4

## 2016-06-28 MED ORDER — INSULIN ASPART 100 UNIT/ML ~~LOC~~ SOLN
0.0000 [IU] | SUBCUTANEOUS | Status: DC
  Administered 2016-06-28 (×2): 4 [IU] via SUBCUTANEOUS
  Administered 2016-06-28 – 2016-06-29 (×2): 16 [IU] via SUBCUTANEOUS
  Administered 2016-06-29: 8 [IU] via SUBCUTANEOUS
  Administered 2016-06-29: 4 [IU] via SUBCUTANEOUS
  Administered 2016-06-29 (×2): 8 [IU] via SUBCUTANEOUS
  Administered 2016-06-29 – 2016-06-30 (×2): 2 [IU] via SUBCUTANEOUS
  Administered 2016-06-30: 12 [IU] via SUBCUTANEOUS

## 2016-06-28 MED ORDER — INSULIN DETEMIR 100 UNIT/ML ~~LOC~~ SOLN
20.0000 [IU] | Freq: Every day | SUBCUTANEOUS | Status: DC
  Administered 2016-06-28 – 2016-06-30 (×3): 20 [IU] via SUBCUTANEOUS
  Filled 2016-06-28 (×4): qty 0.2

## 2016-06-28 MED ORDER — INSULIN DETEMIR 100 UNIT/ML ~~LOC~~ SOLN
20.0000 [IU] | Freq: Every day | SUBCUTANEOUS | Status: DC
  Administered 2016-06-29 – 2016-06-30 (×2): 20 [IU] via SUBCUTANEOUS
  Filled 2016-06-28 (×3): qty 0.2

## 2016-06-28 MED ORDER — INSULIN DETEMIR 100 UNIT/ML ~~LOC~~ SOLN
20.0000 [IU] | Freq: Once | SUBCUTANEOUS | Status: AC
  Administered 2016-06-28: 20 [IU] via SUBCUTANEOUS
  Filled 2016-06-28: qty 0.2

## 2016-06-28 NOTE — Progress Notes (Addendum)
Patient ID: Lawrence [name], male   DOB: 01-31-1951, 65 y.o.   MRN: [id number]  SICU Evening Rounds:  Hemodynamically stable in sinus rhythm.  Diuresed today but still positive. BP is only about 100 tonight so will wait until the morning to give more diuretic.  Glucose shot up to 323 tonight after sugary clear liquid diet. Will increase pm levemir to 20 units and continue SSI. Add meal coverage tomorrow.  BMET    Component Value Date/Time   NA 135 06/28/2016 1652   NA 137 10/12/2013 0515   K 4.4 06/28/2016 1652   K 3.9 10/12/2013 0515   CL 97 (L) 06/28/2016 1652   CL 101 10/12/2013 0515   CO2 25 06/28/2016 0508   CO2 31 10/12/2013 0515   GLUCOSE 200 (H) 06/28/2016 1652   GLUCOSE 218 (H) 10/12/2013 0515   BUN 16 06/28/2016 1652   BUN 20 (H) 10/12/2013 0515   CREATININE 1.12 06/28/2016 1700   CREATININE 0.99 10/12/2013 0515   CALCIUM 8.1 (L) 06/28/2016 0508   CALCIUM 8.8 10/12/2013 0515   GFRNONAA >60 06/28/2016 1700   GFRNONAA >60 10/12/2013 0515   GFRAA >60 06/28/2016 1700   GFRAA >60 10/12/2013 0515   CBC    Component Value Date/Time   WBC 11.6 (H) 06/28/2016 1700   RBC 2.73 (L) 06/28/2016 1700   HGB 8.4 (L) 06/28/2016 1700   HGB 12.8 (L) 10/12/2013 0515   HCT 25.6 (L) 06/28/2016 1700   HCT 36.9 (L) 10/12/2013 0515   PLT 167 06/28/2016 1700   PLT 215 10/12/2013 0515   MCV 93.8 06/28/2016 1700   MCV 95 10/12/2013 0515   MCH 30.8 06/28/2016 1700   MCHC 32.8 06/28/2016 1700   RDW 16.2 (H) 06/28/2016 1700   RDW 13.1 10/12/2013 0515   LYMPHSABS 1.7 06/23/2016 1955   LYMPHSABS 2.3 10/12/2013 0515   MONOABS 0.6 06/23/2016 1955   MONOABS 0.7 10/12/2013 0515   EOSABS 0.1 06/23/2016 1955   EOSABS 0.2 10/12/2013 0515   BASOSABS 0.0 06/23/2016 1955   BASOSABS 0.0 10/12/2013 0515

## 2016-06-28 NOTE — Progress Notes (Signed)
1 Day Post-Op Procedure(s) (LRB): CORONARY ARTERY BYPASS GRAFTING times four using left internal mammary artery and right leg saphenous vein (N/A) TRANSESOPHAGEAL ECHOCARDIOGRAM (TEE) (N/A) Subjective:  Complains of pain  Objective: Vital signs in last 24 hours: Temp:  [96.8 F (36 C)-100.6 F (38.1 C)] 99.3 F (37.4 C) (08/26 1000) Pulse Rate:  [79-103] 101 (08/26 1000) Cardiac Rhythm: Normal sinus rhythm (08/26 0800) Resp:  [12-28] 27 (08/26 1000) BP: (88-127)/(42-54) 127/54 (08/26 1000) SpO2:  [90 %-100 %] 95 % (08/26 1000) Arterial Line BP: (95-162)/(37-61) 119/54 (08/26 1000) FiO2 (%):  [40 %-50 %] 40 % (08/25 1910) Weight:  [140 kg (308 lb 11.2 oz)] 140 kg (308 lb 11.2 oz) (08/26 0415)  Hemodynamic parameters for last 24 hours: PAP: (23-45)/(9-20) 45/18 CO:  [6 L/min-11.7 L/min] 10.7 L/min CI:  [2.4 L/min/m2-4.7 L/min/m2] 4.3 L/min/m2  Intake/Output from previous day: 08/25 0701 - 08/26 0700 In: 6301.8 [P.O.:200; I.V.:4251.8; Blood:800; IV Piggyback:1050] Out: [id number] [Urine:3985; Blood:1100; Chest Tube:330] Intake/Output this shift: Total I/O In: 629.9 [P.O.:360; I.V.:169.9; IV Piggyback:100] Out: -   General appearance: alert and cooperative Neurologic: intact Heart: regular rate and rhythm, S1, S2 normal, no murmur, click, rub or gallop Lungs: clear to auscultation bilaterally Extremities: edema mild Wound: dressings dry  Lab Results:  Recent Labs  06/27/16 1520 06/27/16 1947 06/28/16 0508  WBC 10.8*  --  11.0*  HGB 9.3* 9.5* 9.0*  HCT 28.6* 28.0* 27.9*  PLT 183  --  208   BMET:  Recent Labs  06/27/16 1947 06/28/16 0508  NA 140 138  K 4.0 3.8  CL 102 106  CO2  --  25  GLUCOSE 112* 119*  BUN 13 13  CREATININE 0.70 0.99  CALCIUM  --  8.1*    PT/INR:  Recent Labs  06/27/16 1520  LABPROT 16.2*  INR 1.29   ABG    Component Value Date/Time   PHART 7.390 06/27/2016 2056   HCO3 27.3 (H) 06/27/2016 2056   TCO2 29 06/27/2016 2056   O2SAT  96.0 06/27/2016 2056   CBG (last 3)   Recent Labs  06/28/16 0745 06/28/16 0840 06/28/16 0959  GLUCAP 114* 71 171*    Assessment/Plan: S/P Procedure(s) (LRB): CORONARY ARTERY BYPASS GRAFTING times four using left internal mammary artery and right leg saphenous vein (N/A) TRANSESOPHAGEAL ECHOCARDIOGRAM (TEE) (N/A)   He is hemodynamically stable in sinus rhythm. DC dopamine. Mobilize Diuresis Diabetes control: preop Hgb A1c was 7.7 on Insulin. Will start Levemir and SSI. DC insulin drip. d/c tubes/lines Continue foley due to diuresing patient and patient in ICU See progression orders   LOS: 4 days    Gaye Pollack 06/28/2016

## 2016-06-29 ENCOUNTER — Inpatient Hospital Stay (HOSPITAL_COMMUNITY): Payer: Medicare Other

## 2016-06-29 LAB — CBC
HEMATOCRIT: 26.3 % — AB (ref 39.0–52.0)
Hemoglobin: 8.5 g/dL — ABNORMAL LOW (ref 13.0–17.0)
MCH: 31 pg (ref 26.0–34.0)
MCHC: 32.3 g/dL (ref 30.0–36.0)
MCV: 96 fL (ref 78.0–100.0)
Platelets: 176 10*3/uL (ref 150–400)
RBC: 2.74 MIL/uL — AB (ref 4.22–5.81)
RDW: 15.9 % — AB (ref 11.5–15.5)
WBC: 12.9 10*3/uL — AB (ref 4.0–10.5)

## 2016-06-29 LAB — BASIC METABOLIC PANEL
ANION GAP: 4 — AB (ref 5–15)
BUN: 16 mg/dL (ref 6–20)
CHLORIDE: 102 mmol/L (ref 101–111)
CO2: 29 mmol/L (ref 22–32)
Calcium: 8.2 mg/dL — ABNORMAL LOW (ref 8.9–10.3)
Creatinine, Ser: 0.95 mg/dL (ref 0.61–1.24)
Glucose, Bld: 160 mg/dL — ABNORMAL HIGH (ref 65–99)
POTASSIUM: 4.5 mmol/L (ref 3.5–5.1)
SODIUM: 135 mmol/L (ref 135–145)

## 2016-06-29 LAB — GLUCOSE, CAPILLARY
GLUCOSE-CAPILLARY: 143 mg/dL — AB (ref 65–99)
GLUCOSE-CAPILLARY: 231 mg/dL — AB (ref 65–99)
GLUCOSE-CAPILLARY: 310 mg/dL — AB (ref 65–99)
Glucose-Capillary: 160 mg/dL — ABNORMAL HIGH (ref 65–99)
Glucose-Capillary: 140 mg/dL — ABNORMAL HIGH (ref 65–99)
Glucose-Capillary: 247 mg/dL — ABNORMAL HIGH (ref 65–99)

## 2016-06-29 MED ORDER — METOLAZONE 5 MG PO TABS
10.0000 mg | ORAL_TABLET | Freq: Once | ORAL | Status: AC
  Administered 2016-06-29: 10 mg via ORAL
  Filled 2016-06-29: qty 2

## 2016-06-29 MED ORDER — ORAL CARE MOUTH RINSE
15.0000 mL | Freq: Two times a day (BID) | OROMUCOSAL | Status: DC
  Administered 2016-06-29 – 2016-07-03 (×6): 15 mL via OROMUCOSAL

## 2016-06-29 MED ORDER — POTASSIUM CHLORIDE CRYS ER 20 MEQ PO TBCR
20.0000 meq | EXTENDED_RELEASE_TABLET | Freq: Two times a day (BID) | ORAL | Status: AC
  Administered 2016-06-29 (×2): 20 meq via ORAL
  Filled 2016-06-29 (×2): qty 1

## 2016-06-29 MED ORDER — FUROSEMIDE 10 MG/ML IJ SOLN
40.0000 mg | Freq: Once | INTRAMUSCULAR | Status: AC
  Administered 2016-06-29: 40 mg via INTRAVENOUS
  Filled 2016-06-29: qty 4

## 2016-06-29 NOTE — Progress Notes (Signed)
Patient ID: Lawrence [name], male   DOB: 12-09-1950, 65 y.o.   MRN: [id number]  SICU Evening Rounds:  Hemodynamically stable today.  Diuresing well  Ambulated well. Pain under better control.

## 2016-06-29 NOTE — Progress Notes (Signed)
2 Days Post-Op Procedure(s) (LRB): CORONARY ARTERY BYPASS GRAFTING times four using left internal mammary artery and right leg saphenous vein (N/A) TRANSESOPHAGEAL ECHOCARDIOGRAM (TEE) (N/A) Subjective:  Complains of pain but better than yesterday  Objective: Vital signs in last 24 hours: Temp:  [98.3 F (36.8 C)-99.1 F (37.3 C)] 98.8 F (37.1 C) (08/27 0755) Pulse Rate:  [81-98] 91 (08/27 0926) Cardiac Rhythm: Normal sinus rhythm (08/27 0800) Resp:  [10-24] 15 (08/27 0800) BP: (99-130)/(44-61) 117/52 (08/27 0800) SpO2:  [91 %-100 %] 99 % (08/27 0800) Arterial Line BP: (133)/(44) 133/44 (08/26 1100) Weight:  [138.8 kg (306 lb)] 138.8 kg (306 lb) (08/27 0600)  Hemodynamic parameters for last 24 hours: PAP: (31)/(13) 31/13  Intake/Output from previous day: 08/26 0701 - 08/27 0700 In: 2561.4 [P.O.:2040; I.V.:371.4; IV Piggyback:150] Out: 1510 [id number]; Chest Tube:120] Intake/Output this shift: Total I/O In: 240 [P.O.:240] Out: 130 [Urine:130]  General appearance: alert and cooperative Neurologic: intact Heart: regular rate and rhythm, S1, S2 normal, no murmur, click, rub or gallop Lungs: diminished breath sounds bibasilar Extremities: edema moderate Wound: Aquacel in place  Lab Results:  Recent Labs  06/28/16 1700 06/29/16 0430  WBC 11.6* 12.9*  HGB 8.4* 8.5*  HCT 25.6* 26.3*  PLT 167 176   BMET:  Recent Labs  06/28/16 0508 06/28/16 1652 06/28/16 1700 06/29/16 0430  NA 138 135  --  135  K 3.8 4.4  --  4.5  CL 106 97*  --  102  CO2 25  --   --  29  GLUCOSE 119* 200*  --  160*  BUN 13 16  --  16  CREATININE 0.99 0.90 1.12 0.95  CALCIUM 8.1*  --   --  8.2*    PT/INR:  Recent Labs  06/27/16 1520  LABPROT 16.2*  INR 1.29   ABG    Component Value Date/Time   PHART 7.390 06/27/2016 2056   HCO3 27.3 (H) 06/27/2016 2056   TCO2 26 06/28/2016 1652   O2SAT 96.0 06/27/2016 2056   CBG (last 3)   Recent Labs  06/29/16 0358 06/29/16 0633  06/29/16 0749  GLUCAP 160* 143* 140*   CLINICAL DATA:  Status post CABG  EXAM: PORTABLE CHEST 1 VIEW  COMPARISON:  06/28/2016  FINDINGS: Interval retraction of Swan-Ganz catheter. Sternotomy wires overlie stable cardiac silhouette. There is improvement in vascular congestion. Low lung volumes with some improvement in atelectasis. No pneumothorax.  IMPRESSION: Improved vascular congestion and atelectasis.  Low lung volumes.   Electronically Signed   By: Suzy Bouchard M.D.   On: 06/29/2016 07:20   Assessment/Plan: S/P Procedure(s) (LRB): CORONARY ARTERY BYPASS GRAFTING times four using left internal mammary artery and right leg saphenous vein (N/A) TRANSESOPHAGEAL ECHOCARDIOGRAM (TEE) (N/A)  He is hemodynamically stable in sinus rhythm.  Volume excess: weight down 2 lbs but still 15 lbs up. Positive 1100 cc yesterday. His BP is higher today so will diurese with lasix and Metolazone. Will leave foley in today for diuresis.  DM: glucose under better control this am. Continue Levemir bid and SSI.  Ambulate and IS  Pain control   LOS: 5 days    Gaye Pollack 06/29/2016

## 2016-06-30 ENCOUNTER — Encounter (HOSPITAL_COMMUNITY): Payer: Self-pay | Admitting: Cardiothoracic Surgery

## 2016-06-30 ENCOUNTER — Other Ambulatory Visit: Payer: Self-pay

## 2016-06-30 DIAGNOSIS — Z951 Presence of aortocoronary bypass graft: Secondary | ICD-10-CM

## 2016-06-30 LAB — BASIC METABOLIC PANEL
Anion gap: 7 (ref 5–15)
BUN: 15 mg/dL (ref 6–20)
CO2: 31 mmol/L (ref 22–32)
CREATININE: 0.98 mg/dL (ref 0.61–1.24)
Calcium: 8.3 mg/dL — ABNORMAL LOW (ref 8.9–10.3)
Chloride: 96 mmol/L — ABNORMAL LOW (ref 101–111)
GFR calc Af Amer: >60 mL/min (ref >60)
GLUCOSE: 132 mg/dL — AB (ref 65–99)
POTASSIUM: 3.4 mmol/L — AB (ref 3.5–5.1)
SODIUM: 134 mmol/L — AB (ref 135–145)

## 2016-06-30 LAB — CBC
HEMATOCRIT: 24.5 % — AB (ref 39.0–52.0)
Hemoglobin: 7.9 g/dL — ABNORMAL LOW (ref 13.0–17.0)
MCH: 30.5 pg (ref 26.0–34.0)
MCHC: 32.2 g/dL (ref 30.0–36.0)
MCV: 94.6 fL (ref 78.0–100.0)
PLATELETS: 186 10*3/uL (ref 150–400)
RBC: 2.59 MIL/uL — ABNORMAL LOW (ref 4.22–5.81)
RDW: 15.4 % (ref 11.5–15.5)
WBC: 11.9 10*3/uL — ABNORMAL HIGH (ref 4.0–10.5)

## 2016-06-30 LAB — GLUCOSE, CAPILLARY
GLUCOSE-CAPILLARY: 146 mg/dL — AB (ref 65–99)
GLUCOSE-CAPILLARY: 82 mg/dL (ref 65–99)
GLUCOSE-CAPILLARY: 171 mg/dL — AB (ref 65–99)
Glucose-Capillary: 132 mg/dL — ABNORMAL HIGH (ref 65–99)
Glucose-Capillary: 215 mg/dL — ABNORMAL HIGH (ref 65–99)
Glucose-Capillary: 262 mg/dL — ABNORMAL HIGH (ref 65–99)
Glucose-Capillary: 318 mg/dL — ABNORMAL HIGH (ref 65–99)

## 2016-06-30 LAB — POCT I-STAT 4, (NA,K, GLUC, HGB,HCT)
Glucose, Bld: 143 mg/dL — ABNORMAL HIGH (ref 65–99)
HCT: 29 % — ABNORMAL LOW (ref 39.0–52.0)
Hemoglobin: 9.9 g/dL — ABNORMAL LOW (ref 13.0–17.0)
POTASSIUM: 4.1 mmol/L (ref 3.5–5.1)
SODIUM: 141 mmol/L (ref 135–145)

## 2016-06-30 LAB — TYPE AND SCREEN
ABO/RH(D): O POS
Antibody Screen: NEGATIVE
Unit division: 0
Unit division: 0

## 2016-06-30 MED ORDER — SODIUM CHLORIDE 0.9% FLUSH: 3.0000 mL | INTRAVENOUS | Status: DC | PRN

## 2016-06-30 MED ORDER — SODIUM CHLORIDE 0.9% FLUSH
3.0000 mL | Freq: Two times a day (BID) | INTRAVENOUS | Status: DC
  Administered 2016-06-30 – 2016-07-02 (×3): 3 mL via INTRAVENOUS

## 2016-06-30 MED ORDER — AMIODARONE HCL 200 MG PO TABS
400.0000 mg | ORAL_TABLET | Freq: Two times a day (BID) | ORAL | Status: DC
  Administered 2016-06-30 – 2016-07-03 (×6): 400 mg via ORAL
  Filled 2016-06-30 (×6): qty 2

## 2016-06-30 MED ORDER — AMIODARONE HCL IN DEXTROSE 360-4.14 MG/200ML-% IV SOLN
60.0000 mg/h | INTRAVENOUS | Status: DC
Start: 2016-06-30 — End: 2016-06-30

## 2016-06-30 MED ORDER — SODIUM CHLORIDE 0.9 % IV SOLN: 250.0000 mL | INTRAVENOUS | Status: DC | PRN

## 2016-06-30 MED ORDER — MOVING RIGHT ALONG BOOK
Freq: Once | Status: AC
  Administered 2016-06-30: 08:00:00
  Filled 2016-06-30: qty 1

## 2016-06-30 MED ORDER — AMIODARONE LOAD VIA INFUSION
150.0000 mg | Freq: Once | INTRAVENOUS | Status: DC
  Filled 2016-06-30: qty 83.34

## 2016-06-30 MED ORDER — FE FUMARATE-B12-VIT C-FA-IFC PO CAPS
1.0000 | ORAL_CAPSULE | Freq: Three times a day (TID) | ORAL | Status: DC
  Administered 2016-06-30 – 2016-07-03 (×10): 1 via ORAL
  Filled 2016-06-30 (×10): qty 1

## 2016-06-30 MED ORDER — AMIODARONE HCL IN DEXTROSE 360-4.14 MG/200ML-% IV SOLN: 30.0000 mg/h | INTRAVENOUS | Status: DC

## 2016-06-30 MED ORDER — INSULIN ASPART 100 UNIT/ML ~~LOC~~ SOLN
0.0000 [IU] | Freq: Three times a day (TID) | SUBCUTANEOUS | Status: DC
Start: 2016-06-30 — End: 2016-07-03
  Administered 2016-06-30: 16 [IU] via SUBCUTANEOUS
  Administered 2016-06-30: 8 [IU] via SUBCUTANEOUS
  Administered 2016-06-30: 4 [IU] via SUBCUTANEOUS
  Administered 2016-07-01: 8 [IU] via SUBCUTANEOUS
  Administered 2016-07-01: 16 [IU] via SUBCUTANEOUS
  Administered 2016-07-01: 12 [IU] via SUBCUTANEOUS
  Administered 2016-07-01: 20 [IU] via SUBCUTANEOUS
  Administered 2016-07-02: 16 [IU] via SUBCUTANEOUS
  Administered 2016-07-02: 12 [IU] via SUBCUTANEOUS
  Administered 2016-07-02: 4 [IU] via SUBCUTANEOUS
  Administered 2016-07-03: 8 [IU] via SUBCUTANEOUS

## 2016-06-30 MED ORDER — POTASSIUM CHLORIDE 10 MEQ/50ML IV SOLN
10.0000 meq | INTRAVENOUS | Status: AC
  Administered 2016-06-30 (×2): 10 meq via INTRAVENOUS
  Filled 2016-06-30 (×3): qty 50

## 2016-06-30 MED ORDER — MAGNESIUM HYDROXIDE 400 MG/5ML PO SUSP: 30.0000 mL | Freq: Every day | ORAL | Status: DC | PRN

## 2016-06-30 MED FILL — Sodium Chloride IV Soln 0.9%: INTRAVENOUS | Qty: 2000 | Status: AC

## 2016-06-30 MED FILL — Mannitol IV Soln 20%: INTRAVENOUS | Qty: 500 | Status: AC

## 2016-06-30 MED FILL — Sodium Bicarbonate IV Soln 8.4%: INTRAVENOUS | Qty: 50 | Status: AC

## 2016-06-30 MED FILL — Lidocaine HCl IV Inj 20 MG/ML: INTRAVENOUS | Qty: 5 | Status: AC

## 2016-06-30 MED FILL — Heparin Sodium (Porcine) Inj 1000 Unit/ML: INTRAMUSCULAR | Qty: 30 | Status: AC

## 2016-06-30 MED FILL — Electrolyte-R (PH 7.4) Solution: INTRAVENOUS | Qty: 4000 | Status: AC

## 2016-06-30 NOTE — Op Note (Signed)
NAME:  Lawrence [name], Lawrence [name] NO.:  [id number]  MEDICAL RECORD NO.:  [id number]  LOCATION:                               FACILITY:  [facility]  PHYSICIAN:  Ivin Poot, M.D.  DATE OF BIRTH:  Apr 21, 1951  DATE OF PROCEDURE:  06/27/2016 DATE OF DISCHARGE:                              OPERATIVE REPORT   OPERATION: 1. Coronary artery bypass grafting x4 (left internal mammary artery to     left anterior descending artery, sequential saphenous vein graft to     first obtuse marginal artery and second obtuse marginal artery,     saphenous vein graft to right coronary artery). 2. Endoscopic harvest of right leg greater saphenous vein.  SURGEON:  Ivin Poot, M.D.  ASSISTANT:  John Giovanni, P.A.-C.[name]  ANESTHESIA:  General by Dr. Lillia Abed.  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  CLINICAL NOTE:  The patient is a 65 year old morbidly obese diabetic, who underwent previous cardiac catheterization at [facility].  He was in the process of being referred and scheduled for surgery when he developed symptoms of unstable angina and required hospital admission.  He was transferred to this hospital with recommendations for surgical coronary revascularization.  I saw the patient in consultation on the Cardiology service, reviewed the images of his cardiac catheterization and echocardiogram with the patient, and discussed the role of CABG for treatment of his severe CAD.  I discussed the major aspects of the operation, including the use of general anesthesia and cardiopulmonary bypass, the location of the surgical incisions, and the expected postoperative hospital recovery.  I reviewed with the patient the risks to him of the operation. Including the risks of MI, bleeding, stroke, blood transfusion requirement, postoperative infection, postoperative lung problems including pleural effusions, and death.  After  reviewing these issues, he demonstrated his understanding and agreed to proceed with surgery under what I felt was an informed consent.  OPERATIVE FINDINGS: 1. Severe calcified coronary artery disease suboptimal targets. 2. No packed cell transfusion required for the surgery. 3. Saphenous vein conduit slightly thickened, enlarged but adequate     and satisfactory.  PROCEDURE:  The patient was brought to the operating and placed supine on the operating room table.  General anesthesia was induced under invasive hemodynamic monitoring.  The chest, abdomen, and legs were prepped with Betadine and draped as a sterile field.  A proper time-out was performed.  A sternal incision was made as the saphenous vein was harvested endoscopically from the right leg.  The left internal mammary artery was harvested as a pedicle graft from its origin at the subclavian vessels.  It was a 1.5-mm vessel with excellent flow.  The sternal retractor was placed using the deep blades because of the patient's obese body habitus.  The pericardium was opened and suspended. The ascending aorta was examined, palpated, and inspected.  It was thickened but had no severe calcification plaque.  Pursestrings were placed in the ascending aorta and right atrium, and after the saphenous vein had been harvested, the patient was heparinized, and the ACT was documented as being therapeutic.  The patient was cannulated and placed on  cardiopulmonary bypass.  The coronaries were identified for grafting. The mammary artery and vein grafts were prepared for the distal anastomoses.  Cardioplegia cannulas were placed both antegrade and retrograde cold blood cardioplegia.  The patient was cooled to 32 degrees.  Aortic crossclamp was applied.  One liter of cold blood cardioplegia was delivered in split doses between the antegrade aortic and retrograde coronary sinus catheters.  There was good cardioplegic arrest.  The distal  coronary anastomoses were performed.  The first distal anastomosis was to the right coronary.  This was a smaller nondominant vessel, heavily calcified with a 95% stenosis.  A reverse saphenous vein was sewn to the mid vessel with a reverse saphenous vein using running 7- 0 Prolene, and there was good flow through the graft.  Cardioplegia was redosed.  The second and third distal anastomoses consisted of a sequential vein graft to the OM1 and OM2.  The OM1 was a smaller of the 2 vessels, both had proximal 80% to 90% stenosis.  The vein was sewn side-to-side to the OM1 with running 7-0 Prolene and then continued to the OM2, which was a large 1.7 mm vessel sewn end-to-side with running 7-0 Prolene.  There was good flow through the graft, and cardioplegia was redosed.  The fourth distal anastomosis was the distal third of the LAD.  The LAD had diffuse severe calcification.  The left IMA pedicle was brought through an opening in the left lateral pericardium and was brought down onto the LAD.  It was sewn end-to-side to the 1.5 mm LAD with running 8- 0 Prolene.  There was good flow through the anastomosis after briefly releasing the pedicle bulldog on the mammary artery.  The bulldog was reapplied, and the pedicle was secured to the epicardium.  When the anastomosis was inspected, it was adequately hemostatic.  The patient was administered retrograde cardioplegia as the proximal anastomoses were performed.  Two 4.5 mm punch holes were placed in the ascending aorta, and the veins were sewn end-to-side with running 6-0 Prolene.  Prior to tying down the final anastomosis, air was vented from the coronaries with a dose of retrograde warm blood cardioplegia.  The crossclamp was removed.  The heart resumed a spontaneous rhythm.  The vein grafts were de-aired and opened.  Each had good flow, and hemostasis was documented at the proximal and distal anastomoses.  The mammary artery was inspected  and had good flow, but there was some bleeding from the distal anastomosis.  This was attempted, repaired while on bypass; however, I was not satisfied with the repair and decided to redo the anastomosis.  The patient was then treated with a second hypothermic cardioplegic arrest of approximately 20 minutes to redo the mammary to LAD anastomosis with a running 8-0 Prolene.  This was done with a more proximal portion of the mammary artery which was of better quality, and the anastomosis was widely patent and hemostatic. The pedicle was secured to the epicardium and the crossclamp was removed, and all the grafts were open and reperfused again.  The patient was rewarmed and reperfused.  Temporary pacing wires were applied.  The lungs were expanded.  The ventilator was resumed.  The patient was adequately reperfused.  He was weaned from cardiopulmonary bypass without difficulty.  Echo showed preserved LV function. Hemodynamics were stable.  Protamine was administered without adverse reaction.  The cannulas were removed.  The mediastinum was irrigated. The mediastinum was inspected.  There was still diffuse coagulopathy. The patient received a  unit of platelets and FFP with improved coagulation function.  An anterior mediastinal and left pleural chest tube were placed and brought out through separate incisions.  The superior pericardial fat was closed over the aorta.  The sternum was closed with interrupted double 0-gauge wire.  The pectoralis fascia was closed with a running #1 Vicryl.  The subcutaneous and skin layers were closed in running Vicryl, and sterile dressings were applied.  Total cardiopulmonary bypass time was 180 minutes.     Ivin Poot, M.D.   ______________________________ Ivin Poot, M.D.    PV/MEDQ  D:  06/28/2016  T:  06/28/2016  Job:  [id number]  cc:   Minna Merritts, MD

## 2016-06-30 NOTE — Progress Notes (Signed)
Inpatient Diabetes Program Recommendations  AACE/ADA: New Consensus Statement on Inpatient Glycemic Control (2015)  Target Ranges:  Prepandial:   less than 140 mg/dL      Peak postprandial:   less than 180 mg/dL (1-2 hours)      Critically ill patients:  140 - 180 mg/dL   Results for Lawrence [name], Lawrence [name] (MRN [id number]) as of 06/30/2016 11:52  Ref. Range 06/29/2016 06:33 06/29/2016 07:49 06/29/2016 11:17 06/29/2016 15:28 06/29/2016 19:50 06/30/2016 00:09 06/30/2016 04:13 06/30/2016 08:22  Glucose-Capillary Latest Ref Range: 65 - 99 mg/dL 143 (H) 140 (H) 231 (H) 310 (H) 247 (H) 262 (H) 132 (H) 82    Review of Glycemic Control  Diabetes history: DM2 Outpatient Diabetes medications: Lantus 20 units qd + Novolog 20 units tid meal coverage Current orders for Inpatient glycemic control: Levemir 20 units bid + Novolog correction 0-24 qid  Inpatient Diabetes Program Recommendations:  Noted elevated postprandial CBGS. Please consider meal coverage Novolog 10 units tid if eats 50% and decrease Novolog correction to 0-15 units tid with meals and 0-5 units hs.  Thank you, Nani Gasser. Chonita Gadea, RN, MSN, CDE Inpatient Glycemic Control Team Team Pager [phone] (8am-5pm) 06/30/2016 12:05 PM

## 2016-06-30 NOTE — Progress Notes (Signed)
Patient tx to 2w02 from 2S. Patient states 4/10 pain. No SOB. Patient 85% on RA placed on 2L O2 nasal cannula 97%. Patient sitting up in the chair. Patient Alert and oriented. Patient oriented to unit and room. Personal belongings at the bedside. Wife at bedside. Call bell within reach. Will continue to monitor.   Domingo Dimes RN

## 2016-06-30 NOTE — Significant Event (Signed)
Rapid Response Event Note Called by Sharrie Rothman, RN that pt has had sudden onset Rapid Heart rate 200s with c/o midsternal chest pain and SOB.  No IV access. Upon arrival to room pt sitting on edge of bed, alert, oriented, c/o pounding sensation in chest with HR 156 irregular rhythm.    Confirmed with Central monitoring that pt went  into SVT rate 208 at 2224 hrs  And had a 5 beat run of Vtach at 2226.   BP 126/64  RR 22 with 02 sats 97% on 2l [place] Palpable pulses throughout, Bilateral BS clear, diminished in bilateral bases.   At 2238 pt's HR 106 on dynamap. Pt's chest pain and SOB resolved with resolution  of rapid heart rate.    Overview: Time Called: 2228 Arrival Time: 2230 Event Type: Cardiac  Initial Focused Assessment:   Interventions: Call IV team for Stat IV access -obtained #20 gauge in RFA.  Dr Servando Snare notified by Sharrie Rothman, RN and obtained orders for amiodarone Bolus and gtt. 12lead EKG obtained at 2240 hrs with pt now spontaneously converted to NRS rate 94 .  Repeat phone call to Dr Servando Snare by Sharrie Rothman, RN with orders obtained to hold amiodarone bolus and IV and Give po dose 400mg  now.  Given at 2314   2315:  Denies CP/ SOB  HR 98 NSR with BP 125/53    Hand off report given to Sharrie Rothman, RN who will monitor  VS, and cardiac rhythm through the night and to call for any concerns.  Pt instructed to maintain bedrest for tonight nad call for any assistance needed.  Event Summary: Name of Physician Notified: Dr Servando Snare at 2245    at    Outcome: Stayed in room and stabalized Event ended at 2320 hrs.    [employer], Gust Brooms

## 2016-06-30 NOTE — Progress Notes (Signed)
3 Days Post-Op Procedure(s) (LRB): CORONARY ARTERY BYPASS GRAFTING times four using left internal mammary artery and right leg saphenous vein (N/A) TRANSESOPHAGEAL ECHOCARDIOGRAM (TEE) (N/A) Subjective:  Continues to progress after CABG Ready to transfer to 2 west Objective: Vital signs in last 24 hours: Temp:  [97.5 F (36.4 C)-99.3 F (37.4 C)] 98.6 F (37 C) (08/28 1100) Cardiac Rhythm: Normal sinus rhythm (08/28 0800) Resp:  [12-28] 20 (08/28 1100) BP: (94-132)/(35-61) 115/38 (08/28 1100) SpO2:  [95 %-100 %] 99 % (08/28 1100) Weight:  [302 lb 6.4 oz (137.2 kg)] 302 lb 6.4 oz (137.2 kg) (08/28 0500)  Hemodynamic parameters for last 24 hours:  stable  Intake/Output from previous day: 08/27 0701 - 08/28 0700 In: 890 [P.O.:840; IV Piggyback:50] Out: 3310 [Urine:3310] Intake/Output this shift: Total I/O In: 290 [P.O.:240; IV Piggyback:50] Out: 1100 [Urine:1100]       Exam    General- alert and comfortable   Lungs- clear without rales, wheezes   Cor- regular rate and rhythm, no murmur , gallop   Abdomen- soft, non-tender   Extremities - warm, non-tender, minimal edema   Neuro- oriented, appropriate, no focal weakness   Lab Results:  Recent Labs  06/29/16 0430 06/30/16 0415  WBC 12.9* 11.9*  HGB 8.5* 7.9*  HCT 26.3* 24.5*  PLT 176 186   BMET:  Recent Labs  06/29/16 0430 06/30/16 0415  NA 135 134*  K 4.5 3.4*  CL 102 96*  CO2 29 31  GLUCOSE 160* 132*  BUN 16 15  CREATININE 0.95 0.98  CALCIUM 8.2* 8.3*    PT/INR:  Recent Labs  06/27/16 1520  LABPROT 16.2*  INR 1.29   ABG    Component Value Date/Time   PHART 7.390 06/27/2016 2056   HCO3 27.3 (H) 06/27/2016 2056   TCO2 26 06/28/2016 1652   O2SAT 96.0 06/27/2016 2056   CBG (last 3)   Recent Labs  06/30/16 0413 06/30/16 0822 06/30/16 1217  GLUCAP 132* 82 171*    Assessment/Plan: S/P Procedure(s) (LRB): CORONARY ARTERY BYPASS GRAFTING times four using left internal mammary artery and  right leg saphenous vein (N/A) TRANSESOPHAGEAL ECHOCARDIOGRAM (TEE) (N/A) Mobilize Diuresis Diabetes control Plan for transfer to step-down: see transfer orders   LOS: 6 days    Lawrence [name] 06/30/2016

## 2016-06-30 NOTE — Care Management Note (Signed)
Case Management Note  Patient Details  Name: Lawrence [name] MRN: [id number] Date of Birth: 1951-10-19  Subjective/Objective:   Patient is s/p Bypass grafting.               Action/Plan: Patient is improving but Per MD note continues with Volume excess: weight down 2 lbs but still 15 lbs up. Positive 1100 cc yesterday. His BP is higher today so will diurese with lasix and Metolazone.     Expected Discharge Date:   to be determined               Expected Discharge Plan:   to be determined  In-House Referral:     Discharge planning Services     Post Acute Care Choice:    Choice offered to:     DME Arranged:    DME Agency:     HH Arranged:    HH Agency:     Status of Service:     If discussed at [employer], dates discussed:    Additional Comments:  Ninfa Meeker, RN 06/30/2016, 12:48 PM

## 2016-06-30 NOTE — Care Management Important Message (Signed)
Important Message  Patient Details  Name: Lawrence [name] MRN: [id number] Date of Birth: 1951-04-03   Medicare Important Message Given:  Yes    [name] May 06/30/2016, 12:03 PM

## 2016-07-01 ENCOUNTER — Encounter (HOSPITAL_COMMUNITY): Payer: Self-pay | Admitting: General Practice

## 2016-07-01 ENCOUNTER — Inpatient Hospital Stay (HOSPITAL_COMMUNITY): Payer: Medicare Other

## 2016-07-01 LAB — MAGNESIUM
Magnesium: 1.8 mg/dL (ref 1.7–2.4)
Magnesium: 2 mg/dL (ref 1.7–2.4)

## 2016-07-01 LAB — HEPATIC FUNCTION PANEL
ALT: 15 U/L — ABNORMAL LOW (ref 17–63)
AST: 18 U/L (ref 15–41)
Albumin: 2.7 g/dL — ABNORMAL LOW (ref 3.5–5.0)
Alkaline Phosphatase: 95 U/L (ref 38–126)
Bilirubin, Direct: 0.2 mg/dL (ref 0.1–0.5)
Indirect Bilirubin: 1.2 mg/dL — ABNORMAL HIGH (ref 0.3–0.9)
Total Bilirubin: 1.4 mg/dL — ABNORMAL HIGH (ref 0.3–1.2)
Total Protein: 5.7 g/dL — ABNORMAL LOW (ref 6.5–8.1)

## 2016-07-01 LAB — BASIC METABOLIC PANEL
Anion gap: 8 (ref 5–15)
BUN: 16 mg/dL (ref 6–20)
CO2: 32 mmol/L (ref 22–32)
Calcium: 8.3 mg/dL — ABNORMAL LOW (ref 8.9–10.3)
Chloride: 94 mmol/L — ABNORMAL LOW (ref 101–111)
Creatinine, Ser: 0.95 mg/dL (ref 0.61–1.24)
GFR calc Af Amer: >60 mL/min (ref >60)
GFR calc non Af Amer: >60 mL/min (ref >60)
Glucose, Bld: 232 mg/dL — ABNORMAL HIGH (ref 65–99)
Potassium: 3.7 mmol/L (ref 3.5–5.1)
Sodium: 134 mmol/L — ABNORMAL LOW (ref 135–145)

## 2016-07-01 LAB — CBC
HCT: 24.2 % — ABNORMAL LOW (ref 39.0–52.0)
Hemoglobin: 7.8 g/dL — ABNORMAL LOW (ref 13.0–17.0)
MCH: 30.4 pg (ref 26.0–34.0)
MCHC: 32.2 g/dL (ref 30.0–36.0)
MCV: 94.2 fL (ref 78.0–100.0)
Platelets: 222 10*3/uL (ref 150–400)
RBC: 2.57 MIL/uL — ABNORMAL LOW (ref 4.22–5.81)
RDW: 15 % (ref 11.5–15.5)
WBC: 9.2 10*3/uL (ref 4.0–10.5)

## 2016-07-01 LAB — GLUCOSE, CAPILLARY
GLUCOSE-CAPILLARY: 339 mg/dL — AB (ref 65–99)
Glucose-Capillary: 231 mg/dL — ABNORMAL HIGH (ref 65–99)
Glucose-Capillary: 288 mg/dL — ABNORMAL HIGH (ref 65–99)
Glucose-Capillary: 371 mg/dL — ABNORMAL HIGH (ref 65–99)
Glucose-Capillary: 310 mg/dL — ABNORMAL HIGH (ref 65–99)

## 2016-07-01 LAB — TSH: TSH: 3.868 u[IU]/mL (ref 0.350–4.500)

## 2016-07-01 LAB — DIGOXIN LEVEL: Digoxin Level: <0.2 ng/mL — ABNORMAL LOW (ref 0.8–2.0)

## 2016-07-01 MED ORDER — FUROSEMIDE 40 MG PO TABS
40.0000 mg | ORAL_TABLET | Freq: Every day | ORAL | Status: DC
  Administered 2016-07-01 – 2016-07-03 (×3): 40 mg via ORAL
  Filled 2016-07-01 (×3): qty 1

## 2016-07-01 MED ORDER — INSULIN ASPART 100 UNIT/ML ~~LOC~~ SOLN: 0.0000 [IU] | Freq: Every day | SUBCUTANEOUS | Status: DC

## 2016-07-01 MED ORDER — INSULIN DETEMIR 100 UNIT/ML ~~LOC~~ SOLN
23.0000 [IU] | Freq: Every day | SUBCUTANEOUS | Status: DC
  Filled 2016-07-01: qty 0.23

## 2016-07-01 MED ORDER — METOPROLOL TARTRATE 25 MG PO TABS
25.0000 mg | ORAL_TABLET | Freq: Two times a day (BID) | ORAL | Status: DC
  Administered 2016-07-01 – 2016-07-03 (×5): 25 mg via ORAL
  Filled 2016-07-01 (×5): qty 1

## 2016-07-01 MED ORDER — POTASSIUM CHLORIDE CRYS ER 20 MEQ PO TBCR
20.0000 meq | EXTENDED_RELEASE_TABLET | Freq: Every day | ORAL | Status: DC
  Administered 2016-07-01 – 2016-07-02 (×2): 20 meq via ORAL
  Filled 2016-07-01 (×2): qty 1

## 2016-07-01 MED ORDER — INSULIN DETEMIR 100 UNIT/ML ~~LOC~~ SOLN
23.0000 [IU] | Freq: Every day | SUBCUTANEOUS | Status: DC
  Administered 2016-07-01: 23 [IU] via SUBCUTANEOUS
  Filled 2016-07-01 (×2): qty 0.23

## 2016-07-01 MED ORDER — SORBITOL 70 % PO SOLN
60.0000 mL | Freq: Once | ORAL | Status: AC
  Administered 2016-07-01: 60 mL via ORAL
  Filled 2016-07-01: qty 60

## 2016-07-01 MED ORDER — INSULIN DETEMIR 100 UNIT/ML ~~LOC~~ SOLN
30.0000 [IU] | Freq: Every day | SUBCUTANEOUS | Status: DC
  Administered 2016-07-02 – 2016-07-03 (×2): 30 [IU] via SUBCUTANEOUS
  Filled 2016-07-01 (×2): qty 0.3

## 2016-07-01 MED ORDER — INSULIN ASPART 100 UNIT/ML ~~LOC~~ SOLN
6.0000 [IU] | Freq: Three times a day (TID) | SUBCUTANEOUS | Status: DC
  Administered 2016-07-01 – 2016-07-03 (×5): 6 [IU] via SUBCUTANEOUS

## 2016-07-01 MED ORDER — INSULIN DETEMIR 100 UNIT/ML ~~LOC~~ SOLN
30.0000 [IU] | Freq: Every day | SUBCUTANEOUS | Status: DC
  Administered 2016-07-01 – 2016-07-02 (×2): 30 [IU] via SUBCUTANEOUS
  Filled 2016-07-01 (×3): qty 0.3

## 2016-07-01 NOTE — Progress Notes (Signed)
Inpatient Diabetes Program Recommendations  AACE/ADA: New Consensus Statement on Inpatient Glycemic Control (2015)  Target Ranges:  Prepandial:   less than 140 mg/dL      Peak postprandial:   less than 180 mg/dL (1-2 hours)      Critically ill patients:  140 - 180 mg/dL   Results for Lawrence [name], Lawrence [name] (MRN [id number]) as of 07/01/2016 13:07  Ref. Range 06/30/2016 04:13 06/30/2016 08:22 06/30/2016 12:17 06/30/2016 16:25 06/30/2016 21:23 07/01/2016 06:16 07/01/2016 11:21  Glucose-Capillary Latest Ref Range: 65 - 99 mg/dL 132 (H) 82 171 (H) 215 (H) 318 (H) 231 (H) 288 (H)   Review of Glycemic Control  Diabetes history: DM2 Outpatient Diabetes medications: Lantus 20 units QHS, Novolog 20 units TID with meals Current orders for Inpatient glycemic control: Levemir 23 units daily, Levemir 23 units QHS, Novolog 0-24 units ACHS  Inpatient Diabetes Program Recommendations: Insulin - Basal: Note that Levemir was increased to 23 units BID. Insulin - Meal Coverage: Post prandial glucose is consistently elevated. Please consider ordering Novolog 10 units TID with meals for meal coverage. A1C: Please consider ordering an A1C to evaluate glycemic control over the past 2-3 months.   Thanks, Barnie Alderman, RN, MSN, CDE Diabetes Coordinator Inpatient Diabetes Program [phone] (Team Pager from [place] to [place]) [phone] ([facility] office) [phone] [facility] office) [phone] [facility] office)

## 2016-07-01 NOTE — Discharge Summary (Signed)
Physician Discharge Summary  Patient ID: LORANZO TIANO MRN: [id number] DOB/AGE: 03-28-1951 65 y.o.  Admit date: 06/24/2016 Discharge date: 07/03/2016  Admission Diagnoses:  Discharge Diagnoses:  Active Problems:   Unstable angina (HCC)   S/P CABG (coronary artery bypass graft)   Discharged Condition: good  HPI:  This is a 65 year old morbidly obese diabetic reformed smoker with prior history of subendocardial MI in 2008 presents with to 3 weeks of progressive chest pain consistent with angina as well as dyspnea on exertion. A stress test was positive for ischemic changes and he underwent cardiac catheterization earlier this month by Dr. Nehemiah Massed. He was  found have three-vessel coronary disease with 70% left main stenosis and was felt be a candidate for surgical coronary revascularization. LV function appears to be preserved. The patient's chest pain became unstable and at rest and he was admitted to the hospital and transferred to this facility for cardiac surgery. The patient currently stable on IV heparin. He was scheduled for coronary artery bypass surgery.   Hospital Course:  On 06/27/2016 Mr. Colina underwent a coronary bypass grafting 4 with Dr. Prescott Gum. The patient tolerated the procedure well and was transferred to the ICU. The patient was extubated in a timely manner. We initiated diuretic regimen for fluid overload. A diabetic educator was consulted for uncontrolled diabetes mellitus type 2. His chest tubes were discontinued and he was transferred to the telemetry unit on 06/30/2016. He had a bout of atrial fibrillation with rates in the 150s however he converted to normal sinus rhythm. We started him on a by mouth amiodarone and increased his beta blockade. We continue to encourage ambulation and weaning of supplemental oxygen. We continue to titrate his BB for increased HR and BP control. He has continued to progress well and is stable for discharge  Consults: cardiology and  diabetic coordinator  Significant Diagnostic Studies:  CLINICAL DATA:  Four days out from CABG.  EXAM: CHEST  2 VIEW  COMPARISON:  Portable chest x-ray of June 29, 2016  FINDINGS: The lungs remain hypoinflated. The pulmonary interstitium has improved. The cardiac silhouette remains enlarged. The pulmonary vascularity is less engorged. There are bilateral pleural effusions layering posteriorly. There is no pneumothorax.  IMPRESSION: Decreased pulmonary interstitial edema. Persistent bilateral pleural effusions layering posteriorly. Cardiomegaly with only mild central pulmonary vascular congestion.   Electronically Signed   By: David  Martinique M.D.   On: 07/01/2016 07:15   Treatments:  OPERATION: 1. Coronary artery bypass grafting x4 (left internal mammary artery to     left anterior descending artery, sequential saphenous vein graft to     first obtuse marginal artery and second obtuse marginal artery,     saphenous vein graft to right coronary artery). 2. Endoscopic harvest of right leg greater saphenous vein.  SURGEON:  Ivin Poot, M.D.  ASSISTANT:  John Giovanni, P.A.-C.[name]  ANESTHESIA:  General by Dr. Lillia Abed. Discharge Exam: Blood pressure (!) 132/52, pulse 85, temperature 98.8 F (37.1 C), temperature source Oral, resp. rate 18, height 5\' 11"  (1.803 m), weight 300 lb (136.1 kg), SpO2 94 %.   General appearance: alert, cooperative and no distress Heart: regular rate and rhythm Lungs: mildly dim in bases Abdomen: benign Extremities: + LE edema Wound: incis healing well   Disposition: discharged home    Medication List    STOP taking these medications   CHOLESTOFF PO   isosorbide mononitrate 30 MG 24 hr tablet Commonly known as:  IMDUR   RANEXA 1000  MG SR tablet Generic drug:  ranolazine     TAKE these medications   amiodarone 200 MG tablet Commonly known as:  PACERONE Take 1 tablet (200 mg total) by mouth 2 (two) times  daily.   aspirin 325 MG EC tablet Take 1 tablet (325 mg total) by mouth daily.   cetirizine 10 MG tablet Commonly known as:  ZYRTEC Take 10 mg by mouth daily.   cholecalciferol 1000 units tablet Commonly known as:  VITAMIN D Take 1,000 Units by mouth daily.   docusate sodium 100 MG capsule Commonly known as:  COLACE Take 200 mg by mouth 2 (two) times daily.   ferrous Q000111Q C-folic acid capsule Commonly known as:  TRINSICON / FOLTRIN Take 1 capsule by mouth 3 (three) times daily after meals.   furosemide 40 MG tablet Commonly known as:  LASIX Take 1 tablet (40 mg total) by mouth daily. What changed:  medication strength  how much to take  when to take this   GLUCAGON EMERGENCY 1 MG injection Generic drug:  glucagon Inject 1 mg into the vein once as needed. Reported on 02/11/2016   insulin aspart 100 UNIT/ML injection Commonly known as:  NOVOLOG Inject 20 Units into the skin 3 (three) times daily. What changed:  when to take this   LANTUS 100 UNIT/ML injection Generic drug:  insulin glargine Inject 20 Units into the skin at bedtime.   latanoprost 0.005 % ophthalmic solution Commonly known as:  XALATAN Place 1 drop into both eyes at bedtime.   lisinopril 5 MG tablet Commonly known as:  PRINIVIL,ZESTRIL Take 1 tablet (5 mg total) by mouth daily.   metoprolol tartrate 25 MG tablet Commonly known as:  LOPRESSOR Take 25 mg by mouth 2 (two) times daily.   multivitamin capsule Take 1 capsule by mouth daily.   oxyCODONE 5 MG immediate release tablet Commonly known as:  Oxy IR/ROXICODONE Take 1-2 tablets (5-10 mg total) by mouth every 6 (six) hours as needed for severe pain.   potassium chloride SA 20 MEQ tablet Commonly known as:  K-DUR,KLOR-CON Take 1 tablet (20 mEq total) by mouth daily.   rosuvastatin 20 MG tablet Commonly known as:  CRESTOR Take 1 tablet (20 mg total) by mouth daily at 6 PM.   vitamin C 500 MG tablet Commonly known as:   ASCORBIC ACID Take 500 mg by mouth 2 (two) times daily.      Follow-up Information    Tharon Aquas Adelene Idler, MD .   Specialty:  Cardiothoracic Surgery Why:  Appointment on 9/27 at 12:30. Please arrive at 12:00 for a chest xray at [facility] which is located on the first floor of our building.  Contact information: [address] [place] [address] [phone]        Sheral Flow, NP Follow up in 4 week(s).   Specialty:  Internal Medicine Why:  Please call for an appointment.  Contact information: [place] [address] [phone]           The patient has been discharged on:   1.Beta Blocker:  Yes [ x  ]                              No   [   ]  If No, reason:  2.Ace Inhibitor/ARB: Yes [ x  ]                                     No  [    ]                                     If No, reason:  3.Statin:   Yes [ x  ]                  No  [   ]                  If No, reason:  4.Ecasa:  Yes  [ x  ]                  No   [   ]                  If No, reason:   Signed: GOLD,[name] E 07/03/2016, 8:31 AM patient examined and medical record reviewed,agree with above note. Tharon Aquas Trigt III 07/03/2016

## 2016-07-01 NOTE — Progress Notes (Signed)
CARDIAC REHAB PHASE I   PRE:  Rate/Rhythm: 78 SR  BP:  Supine:   Sitting: 122/49  Standing:    SaO2: 98%2L, 95% 1L  MODE:  Ambulation: 500 ft   POST:  Rate/Rhythm: 92  BP:  Supine:   Sitting: 121/48  Standing:    SaO2: 89-92%RA hall, 89% room put on 1L 1330-1405 Pt walked 500 ft with rolling walker and asst x 1 stopping several times to rest. Started out on 1L and decreased to RA for walk. Sats maintained 89-92% RA but to 89% in room. Put on 1L and encouraged IS. Has rolling walker at home for use.    Graylon Good, RN BSN  07/01/2016 2:12 PM

## 2016-07-01 NOTE — Progress Notes (Signed)
Called into pt room by pt. Pt had checked his own blood sugar and obtained a reading >400. Pt states he feels tired. Re-checked blood sugar using hospital glucometer - obtained reading of 339. Spoke with Dr. Prescott Gum. Changes made to the patient's insulin orders. Pt and wife aware of changes. Will continue to monitor.   Fritz Pickerel, RN

## 2016-07-01 NOTE — Progress Notes (Signed)
  Amiodarone Drug - Drug Interaction Consult Note  Recommendations: Monitor patient  Amiodarone is metabolized by the cytochrome P450 system and therefore has the potential to cause many drug interactions. Amiodarone has an average plasma half-life of 50 days (range 20 to 100 days).   There is potential for drug interactions to occur several weeks or months after stopping treatment and the onset of drug interactions may be slow after initiating amiodarone.   [x]  Statins: Increased risk of myopathy. Simvastatin- restrict dose to 20mg  daily. Other statins: counsel patients to report any muscle pain or weakness immediately.   [x]  Beta blockers: increased risk of bradycardia, AV block and myocardial depression. Sotalol - avoid concomitant use.   Thank You,  Sindy Guadeloupe  07/01/2016 1:39 AM

## 2016-07-01 NOTE — Progress Notes (Addendum)
      [address]       [place] [address]             [phone]      4 Days Post-Op Procedure(s) (LRB): CORONARY ARTERY BYPASS GRAFTING times four using left internal mammary artery and right leg saphenous vein (N/A) TRANSESOPHAGEAL ECHOCARDIOGRAM (TEE) (N/A) Subjective: Feeling well. Had atrial fibrillation last night and the patient felt like he was having a panic attack.  Objective: Vital signs in last 24 hours: Temp:  [97.9 F (36.6 C)-98.8 F (37.1 C)] 97.9 F (36.6 C) (08/29 0408) Pulse Rate:  [83-97] 83 (08/29 0408) Cardiac Rhythm: Normal sinus rhythm (08/29 0700) Resp:  [12-22] 19 (08/29 0408) BP: (92-149)/(38-65) 128/49 (08/29 0408) SpO2:  [94 %-100 %] 94 % (08/29 0408) Weight:  [298 lb 3.2 oz (135.3 kg)] 298 lb 3.2 oz (135.3 kg) (08/29 0408)     Intake/Output from previous day: 08/28 0701 - 08/29 0700 In: 780 [P.O.:730; IV Piggyback:50] Out: 2075 [Urine:2075] Intake/Output this shift: No intake/output data recorded.  General appearance: alert and cooperative Heart: regular rate and rhythm, S1, S2 normal, no murmur, click, rub or gallop Lungs: diminished breath sounds bilateral lower lobes Abdomen: soft, non-tender; bowel sounds normal; no masses,  no organomegaly Extremities: + non-pitting pedal edema Wound: c/d/i without erythema  Lab Results:  Recent Labs  06/30/16 0415 07/01/16 0304  WBC 11.9* 9.2  HGB 7.9* 7.8*  HCT 24.5* 24.2*  PLT 186 222   BMET:  Recent Labs  06/30/16 0415 07/01/16 0304  NA 134* 134*  K 3.4* 3.7  CL 96* 94*  CO2 31 32  GLUCOSE 132* 232*  BUN 15 16  CREATININE 0.98 0.95  CALCIUM 8.3* 8.3*    PT/INR: No results for input(s): LABPROT, INR in the last 72 hours. ABG    Component Value Date/Time   PHART 7.390 06/27/2016 2056   HCO3 27.3 (H) 06/27/2016 2056   TCO2 26 06/28/2016 1652   O2SAT 96.0 06/27/2016 2056   CBG (last 3)   Recent Labs  06/30/16 1625 06/30/16 2123 07/01/16 0616    GLUCAP 215* 318* 231*    Assessment/Plan: S/P Procedure(s) (LRB): CORONARY ARTERY BYPASS GRAFTING times four using left internal mammary artery and right leg saphenous vein (N/A) TRANSESOPHAGEAL ECHOCARDIOGRAM (TEE) (N/A)  1. CV- NSR 90s, increase Bb. Afib last night now converted. On PO Amio.   2. Pulm-On 2L Esmeralda. Wean as tolerated. Non-labored respirations.  3. Renal-Creatinine 0.95. Diuretic regimen initiated with supplemental potassium 4. Endocrine-continues to have blood glucose over 200, therefore will increase Levemir for tighter glycemic control.  5. Acute blood loss anemia- expected. H & H 7.8/24.3, stable. No signs of bleeding  Plan: Increase BB to 25mg  BID. Will initiate a diuretic regimen for fluid overload. Pt remains above baseline weight on 2L [place]. Wean oxygen as tolerated. Increase Levemir for better glycemic control. Ambulate TID. Continue to encourage IS use hourly.       LOS: 7 days    Lawrence [name] 07/01/2016 prob home fri patient examined and medical record reviewed,agree with above note. Lawrence [name] 07/01/2016

## 2016-07-01 NOTE — Discharge Instructions (Signed)
Coronary Artery Bypass Grafting, Care After °Refer to this sheet in the next few weeks. These instructions provide you with information on caring for yourself after your procedure. Your health care provider may also give you more specific instructions. Your treatment has been planned according to current medical practices, but problems sometimes occur. Call your health care provider if you have any problems or questions after your procedure. °WHAT TO EXPECT AFTER THE PROCEDURE °Recovery from surgery will be different for everyone. Some people feel well after 3 or 4 weeks, while for others it takes longer. After your procedure, it is typical to have the following: °· Nausea and a lack of appetite.   °· Constipation. °· Weakness and fatigue.   °· Depression or irritability.   °· Pain or discomfort at your incision site. °HOME CARE INSTRUCTIONS °· Take medicines only as directed by your health care provider. Do not stop taking medicines or start any new medicines without first checking with your health care provider. °· Take your pulse as directed by your health care provider. °· Perform deep breathing as directed by your health care provider. If you were given a device called an incentive spirometer, use it to practice deep breathing several times a day. Support your chest with a pillow or your arms when you take deep breaths or cough. °· Keep incision areas clean, dry, and protected. Remove or change any bandages (dressings) only as directed by your health care provider. You may have skin adhesive strips over the incision areas. Do not take the strips off. They will fall off on their own. °· Check incision areas daily for any swelling, redness, or drainage. °· If incisions were made in your legs, do the following: °¨ Avoid crossing your legs.   °¨ Avoid sitting for long periods of time. Change positions every 30 minutes.   °¨ Elevate your legs when you are sitting. °· Wear compression stockings as directed by your  health care provider. These stockings help keep blood clots from forming in your legs. °· Take showers once your health care provider approves. Until then, only take sponge baths. Pat incisions dry. Do not rub incisions with a washcloth or towel. Do not take baths, swim, or use a hot tub until your health care provider approves. °· Eat foods that are high in fiber, such as raw fruits and vegetables, whole grains, beans, and nuts. Meats should be lean cut. Avoid canned, processed, and fried foods. °· Drink enough fluid to keep your urine clear or pale yellow. °· Weigh yourself every day. This helps identify if you are retaining fluid that may make your heart and lungs work harder. °· Rest and limit activity as directed by your health care provider. You may be instructed to: °¨ Stop any activity at once if you have chest pain, shortness of breath, irregular heartbeats, or dizziness. Get help right away if you have any of these symptoms. °¨ Move around frequently for short periods or take short walks as directed by your health care provider. Increase your activities gradually. You may need physical therapy or cardiac rehabilitation to help strengthen your muscles and build your endurance. °¨ Avoid lifting, pushing, or pulling anything heavier than 10 lb (4.5 kg) for at least 6 weeks after surgery. °· Do not drive until your health care provider approves.  °· Ask your health care provider when you may return to work. °· Ask your health care provider when you may resume sexual activity. °· Keep all follow-up visits as directed by your health care   provider. This is important. °SEEK MEDICAL CARE IF: °· You have swelling, redness, increasing pain, or drainage at the site of an incision. °· You have a fever. °· You have swelling in your ankles or legs. °· You have pain in your legs.   °· You gain 2 or more pounds (0.9 kg) a day. °· You are nauseous or vomit. °· You have diarrhea.  °SEEK IMMEDIATE MEDICAL CARE IF: °· You have  chest pain that goes to your jaw or arms. °· You have shortness of breath.   °· You have a fast or irregular heartbeat.   °· You notice a "clicking" in your breastbone (sternum) when you move.   °· You have numbness or weakness in your arms or legs. °· You feel dizzy or light-headed.   °MAKE SURE YOU: °· Understand these instructions. °· Will watch your condition. °· Will get help right away if you are not doing well or get worse. °  °This information is not intended to replace advice given to you by your health care provider. Make sure you discuss any questions you have with your health care provider. °  °Document Released: 05/09/2005 Document Revised: 11/10/2014 Document Reviewed: 03/29/2013 °Elsevier Interactive Patient Education ©2016 [employer] ° °

## 2016-07-01 NOTE — Progress Notes (Signed)
Called into pt room. Pt complains of pressure beside R eye. States in the past the [place] has managed periodic bleeding behind the eye with laser cautery. Pt denies vision changes, no neurological disruptions, pupils equal and reactive. Gave pt PRN pain medication, pt said pain was starting to decrease. Informed Dr. Prescott Gum. Informed to continue to monitor.   Fritz Pickerel, RN

## 2016-07-02 LAB — BASIC METABOLIC PANEL
Anion gap: 9 (ref 5–15)
BUN: 16 mg/dL (ref 6–20)
CO2: 34 mmol/L — ABNORMAL HIGH (ref 22–32)
Calcium: 8.4 mg/dL — ABNORMAL LOW (ref 8.9–10.3)
Chloride: 94 mmol/L — ABNORMAL LOW (ref 101–111)
Creatinine, Ser: 0.99 mg/dL (ref 0.61–1.24)
GFR calc Af Amer: >60 mL/min (ref >60)
GFR calc non Af Amer: >60 mL/min (ref >60)
Glucose, Bld: 173 mg/dL — ABNORMAL HIGH (ref 65–99)
Potassium: 2.9 mmol/L — ABNORMAL LOW (ref 3.5–5.1)
Sodium: 137 mmol/L (ref 135–145)

## 2016-07-02 LAB — GLUCOSE, CAPILLARY
GLUCOSE-CAPILLARY: 114 mg/dL — AB (ref 65–99)
GLUCOSE-CAPILLARY: 230 mg/dL — AB (ref 65–99)
GLUCOSE-CAPILLARY: 311 mg/dL — AB (ref 65–99)
GLUCOSE-CAPILLARY: 198 mg/dL — AB (ref 65–99)
Glucose-Capillary: 251 mg/dL — ABNORMAL HIGH (ref 65–99)

## 2016-07-02 MED ORDER — LISINOPRIL 2.5 MG PO TABS
2.5000 mg | ORAL_TABLET | Freq: Every day | ORAL | Status: DC
  Administered 2016-07-02: 2.5 mg via ORAL
  Filled 2016-07-02 (×2): qty 1

## 2016-07-02 MED ORDER — POTASSIUM CHLORIDE CRYS ER 20 MEQ PO TBCR
40.0000 meq | EXTENDED_RELEASE_TABLET | Freq: Two times a day (BID) | ORAL | Status: AC
  Administered 2016-07-02 (×2): 40 meq via ORAL
  Filled 2016-07-02 (×2): qty 2

## 2016-07-02 NOTE — Progress Notes (Signed)
CARDIAC REHAB PHASE I   PRE:  Rate/Rhythm: 94 SR  BP:  Supine:   Sitting: 139/50  Standing:    SaO2: 90-91%RA  MODE:  Ambulation: 500 ft   POST:  Rate/Rhythm: 93 SR  BP:  Supine: 164/58  Sitting:   Standing:    SaO2: 92%RA hall, 88-90%RA room [phone] Pt seems weaker today. Walked 500 ft on RA with rolling walker and asst x 1 with very slow pace. Stopped many times to rest. Hands a little shakey. To bathroom prior to bed. To bed for pacing wire removal. Notified RN that pt would like his sugar checked.    Graylon Good, RN BSN  07/02/2016 10:29 AM

## 2016-07-02 NOTE — Progress Notes (Signed)
Inpatient Diabetes Program Recommendations  AACE/ADA: New Consensus Statement on Inpatient Glycemic Control (2015)  Target Ranges:  Prepandial:   less than 140 mg/dL      Peak postprandial:   less than 180 mg/dL (1-2 hours)      Critically ill patients:  140 - 180 mg/dL   Results for VALTON, HAPP (MRN [id number]) as of 07/02/2016 09:37  Ref. Range 07/01/2016 06:16 07/01/2016 11:21 07/01/2016 14:37 07/01/2016 16:53 07/01/2016 20:59 07/02/2016 04:11 07/02/2016 08:22  Glucose-Capillary Latest Ref Range: 65 - 99 mg/dL 231 (H) 288 (H) 339 (H) 371 (H) 310 (H) 114 (H) 230 (H)   Review of Glycemic Control  Diabetes history: DM2 Outpatient Diabetes medications: Lantus 20 units QHS, Novolog 20 units TID with meals Current orders for Inpatient glycemic control: Levemir 30 units BID, Novolog 6 units TID with meals for meal coverage, Novolog 0-24 units ACHS  Inpatient Diabetes Program Recommendations: Diet: Please change diet from Regular to CARB MODIFIED diet.  Thanks, Barnie Alderman, RN, MSN, CDE Diabetes Coordinator Inpatient Diabetes Program [phone] (Team Pager from [place] to [place]) [phone] ([facility] office) [phone] [facility] office) [phone] [facility] office)

## 2016-07-02 NOTE — Progress Notes (Addendum)
[address]       [place], [address]             [phone]      5 Days Post-Op Procedure(s) (LRB): CORONARY ARTERY BYPASS GRAFTING times four using left internal mammary artery and right leg saphenous vein (N/A) TRANSESOPHAGEAL ECHOCARDIOGRAM (TEE) (N/A) Subjective: Feels better  Objective: Vital signs in last 24 hours: Temp:  [98.9 F (37.2 C)-99.4 F (37.4 C)] 98.9 F (37.2 C) (08/30 0420) Pulse Rate:  [81-103] 81 (08/30 0420) Cardiac Rhythm: Normal sinus rhythm (08/30 0703) Resp:  [18] 18 (08/30 0420) BP: (121-134)/(50-53) 134/50 (08/30 0420) SpO2:  [98 %-99 %] 98 % (08/30 0420) Weight:  [297 lb (134.7 kg)] 297 lb (134.7 kg) (08/30 0420)  Hemodynamic parameters for last 24 hours:    Intake/Output from previous day: 08/29 0701 - 08/30 0700 In: 600 [P.O.:600] Out: 1700 [Urine:1700] Intake/Output this shift: No intake/output data recorded.  General appearance: alert, cooperative and no distress Heart: regular rate and rhythm Lungs: mildly dim in bases Abdomen: benign Extremities: venous stasis dermatitis noted and + edema Wound: incis healing well  Lab Results:  Recent Labs  06/30/16 0415 07/01/16 0304  WBC 11.9* 9.2  HGB 7.9* 7.8*  HCT 24.5* 24.2*  PLT 186 222   BMET:  Recent Labs  07/01/16 0304 07/02/16 0240  NA 134* 137  K 3.7 2.9*  CL 94* 94*  CO2 32 34*  GLUCOSE 232* 173*  BUN 16 16  CREATININE 0.95 0.99  CALCIUM 8.3* 8.4*    PT/INR: No results for input(s): LABPROT, INR in the last 72 hours. ABG    Component Value Date/Time   PHART 7.390 06/27/2016 2056   HCO3 27.3 (H) 06/27/2016 2056   TCO2 26 06/28/2016 1652   O2SAT 96.0 06/27/2016 2056   CBG (last 3)   Recent Labs  07/01/16 1653 07/01/16 2059 07/02/16 0411  GLUCAP 371* 310* 114*    Meds Scheduled Meds: . sodium chloride   Intravenous Once  . acetaminophen  1,000 mg Oral Q6H   Or  . acetaminophen (TYLENOL) oral liquid 160 mg/5 mL  1,000 mg  Per Tube Q6H  . amiodarone  400 mg Oral BID  . aspirin EC  325 mg Oral Daily  . bisacodyl  10 mg Oral Daily  . docusate sodium  200 mg Oral Daily  . enoxaparin (LOVENOX) injection  40 mg Subcutaneous QHS  . ferrous Q000111Q C-folic acid  1 capsule Oral TID PC  . furosemide  40 mg Oral Daily  . insulin aspart  0-24 Units Subcutaneous TID AC & HS  . insulin aspart  0-5 Units Subcutaneous QHS  . insulin aspart  6 Units Subcutaneous TID WC  . insulin detemir  30 Units Subcutaneous Daily  . insulin detemir  30 Units Subcutaneous QHS  . latanoprost  1 drop Both Eyes QHS  . mouth rinse  15 mL Mouth Rinse BID  . metoprolol tartrate  25 mg Oral BID  . pantoprazole  40 mg Oral Daily  . potassium chloride SA  20 mEq Oral Daily  . rosuvastatin  20 mg Oral q1800  . sodium chloride flush  3 mL Intravenous Q12H  . sodium chloride flush  3 mL Intravenous Q12H   Continuous Infusions: . sodium chloride Stopped (06/28/16 1100)  . sodium chloride    . sodium chloride 20 mL/hr at 06/27/16 1900  . lactated ringers Stopped (06/28/16 1700)  . lactated ringers 20 mL/hr  at 06/27/16 1900   PRN Meds:.sodium chloride, sodium chloride, lactated ringers, magnesium hydroxide, ondansetron (ZOFRAN) IV, oxyCODONE, sodium chloride flush, sodium chloride flush  Xrays Dg Chest 2 View  Result Date: 07/01/2016 CLINICAL DATA:  Four days out from CABG. EXAM: CHEST  2 VIEW COMPARISON:  Portable chest x-ray of June 29, 2016 FINDINGS: The lungs remain hypoinflated. The pulmonary interstitium has improved. The cardiac silhouette remains enlarged. The pulmonary vascularity is less engorged. There are bilateral pleural effusions layering posteriorly. There is no pneumothorax. IMPRESSION: Decreased pulmonary interstitial edema. Persistent bilateral pleural effusions layering posteriorly. Cardiomegaly with only mild central pulmonary vascular congestion. Electronically Signed   By: David  Martinique M.D.   On: 07/01/2016  07:15    Assessment/Plan: S/P Procedure(s) (LRB): CORONARY ARTERY BYPASS GRAFTING times four using left internal mammary artery and right leg saphenous vein (N/A) TRANSESOPHAGEAL ECHOCARDIOGRAM (TEE) (N/A)  1 doing well 2 needs to cont diuresis for volume overload but venous stasis is significant component 3 sinus rhythm 4 sugars poorly controlled change to regular diet, the only thing on his tray this am was carbohydrates- insulin adjusted yesterday 5 replace K+ 6 H/H stable ABL anemia 7 d/c wires 8 poss home 1-2 days 9 start low dose ace-inh   LOS: 8 days    GOLD,[name] E 07/02/2016  patient examined and medical record reviewed,agree with above note. Possibly home Thursday a.m. if everything looks good Tharon Aquas Trigt III 07/02/2016

## 2016-07-02 NOTE — Progress Notes (Signed)
Pt pacing wires removed per protocol. Pt tolerated well. Pt instructed to remain on bedrest until 11:50am. Pt and wife verbalized understanding. VSS.    07/02/16 1050  Vitals  BP (!) 134/52  BP Location Right Arm  BP Method Automatic  Patient Position (if appropriate) Lying  Pulse Rate 83  Pulse Rate Source Dinamap  Resp 18  Oxygen Therapy  SpO2 92 %  O2 Device Room Air

## 2016-07-03 LAB — BASIC METABOLIC PANEL
Anion gap: 8 (ref 5–15)
BUN: 17 mg/dL (ref 6–20)
CO2: 33 mmol/L — ABNORMAL HIGH (ref 22–32)
Calcium: 8.7 mg/dL — ABNORMAL LOW (ref 8.9–10.3)
Chloride: 95 mmol/L — ABNORMAL LOW (ref 101–111)
Creatinine, Ser: 1.06 mg/dL (ref 0.61–1.24)
GFR calc Af Amer: >60 mL/min (ref >60)
GFR calc non Af Amer: >60 mL/min (ref >60)
Glucose, Bld: 141 mg/dL — ABNORMAL HIGH (ref 65–99)
Potassium: 3.3 mmol/L — ABNORMAL LOW (ref 3.5–5.1)
Sodium: 136 mmol/L (ref 135–145)

## 2016-07-03 LAB — GLUCOSE, CAPILLARY
Glucose-Capillary: 111 mg/dL — ABNORMAL HIGH (ref 65–99)
Glucose-Capillary: 206 mg/dL — ABNORMAL HIGH (ref 65–99)
Glucose-Capillary: 177 mg/dL — ABNORMAL HIGH (ref 65–99)

## 2016-07-03 MED ORDER — AMIODARONE HCL 200 MG PO TABS: 200.0000 mg | ORAL_TABLET | Freq: Two times a day (BID) | ORAL | 1 refills | Status: DC

## 2016-07-03 MED ORDER — POTASSIUM CHLORIDE CRYS ER 20 MEQ PO TBCR: 20.0000 meq | EXTENDED_RELEASE_TABLET | Freq: Every day | ORAL | 1 refills | Status: AC

## 2016-07-03 MED ORDER — ROSUVASTATIN CALCIUM 20 MG PO TABS: 20.0000 mg | ORAL_TABLET | Freq: Every day | ORAL | 1 refills | Status: AC

## 2016-07-03 MED ORDER — ASPIRIN 325 MG PO TBEC: 325.0000 mg | DELAYED_RELEASE_TABLET | Freq: Every day | ORAL | Status: DC

## 2016-07-03 MED ORDER — LISINOPRIL 5 MG PO TABS: 5.0000 mg | ORAL_TABLET | Freq: Every day | ORAL | 1 refills | Status: DC

## 2016-07-03 MED ORDER — POTASSIUM CHLORIDE CRYS ER 20 MEQ PO TBCR
40.0000 meq | EXTENDED_RELEASE_TABLET | Freq: Once | ORAL | Status: AC
  Administered 2016-07-03: 40 meq via ORAL
  Filled 2016-07-03: qty 2

## 2016-07-03 MED ORDER — OXYCODONE HCL 5 MG PO TABS: 5.0000 mg | ORAL_TABLET | Freq: Four times a day (QID) | ORAL | 0 refills | Status: DC | PRN

## 2016-07-03 MED ORDER — FUROSEMIDE 40 MG PO TABS: 40.0000 mg | ORAL_TABLET | Freq: Every day | ORAL | 1 refills | Status: AC

## 2016-07-03 MED ORDER — FE FUMARATE-B12-VIT C-FA-IFC PO CAPS: 1.0000 | ORAL_CAPSULE | Freq: Three times a day (TID) | ORAL | 1 refills | Status: DC

## 2016-07-03 MED FILL — Heparin Sodium (Porcine) Inj 1000 Unit/ML: INTRAMUSCULAR | Qty: 30 | Status: AC

## 2016-07-03 MED FILL — Dexmedetomidine HCl in NaCl 0.9% IV Soln 400 MCG/100ML: INTRAVENOUS | Qty: 100 | Status: AC

## 2016-07-03 MED FILL — Potassium Chloride Inj 2 mEq/ML: INTRAVENOUS | Qty: 40 | Status: AC

## 2016-07-03 MED FILL — Magnesium Sulfate Inj 50%: INTRAMUSCULAR | Qty: 10 | Status: AC

## 2016-07-03 NOTE — Progress Notes (Addendum)
[address]       [employer] [address]             [phone]      6 Days Post-Op Procedure(s) (LRB): CORONARY ARTERY BYPASS GRAFTING times four using left internal mammary artery and right leg saphenous vein (N/A) TRANSESOPHAGEAL ECHOCARDIOGRAM (TEE) (N/A) Subjective: Feels ok, didn't sleep well  Objective: Vital signs in last 24 hours: Temp:  [97.9 F (36.6 C)-99 F (37.2 C)] 98.8 F (37.1 C) (08/31 0439) Pulse Rate:  [72-93] 85 (08/31 0439) Cardiac Rhythm: Normal sinus rhythm (08/30 1930) Resp:  [18] 18 (08/31 0439) BP: (100-145)/(39-83) 132/52 (08/31 0439) SpO2:  [92 %-98 %] 94 % (08/31 0439) Weight:  [300 lb (136.1 kg)] 300 lb (136.1 kg) (08/31 0439)  Hemodynamic parameters for last 24 hours:    Intake/Output from previous day: 08/30 0701 - 08/31 0700 In: 960 [P.O.:960] Out: 2050 [Urine:2050] Intake/Output this shift: No intake/output data recorded.  General appearance: alert, cooperative and no distress Heart: regular rate and rhythm Lungs: mildly dim in bases Abdomen: benign Extremities: + LE edema Wound: incis healing well  Lab Results:  Recent Labs  07/01/16 0304  WBC 9.2  HGB 7.8*  HCT 24.2*  PLT 222   BMET:  Recent Labs  07/02/16 0240 07/03/16 0133  NA 137 136  K 2.9* 3.3*  CL 94* 95*  CO2 34* 33*  GLUCOSE 173* 141*  BUN 16 17  CREATININE 0.99 1.06  CALCIUM 8.4* 8.7*    PT/INR: No results for input(s): LABPROT, INR in the last 72 hours. ABG    Component Value Date/Time   PHART 7.390 06/27/2016 2056   HCO3 27.3 (H) 06/27/2016 2056   TCO2 26 06/28/2016 1652   O2SAT 96.0 06/27/2016 2056   CBG (last 3)   Recent Labs  07/02/16 1616 07/02/16 2131 07/03/16 0434  GLUCAP 198* 251* 111*    Meds Scheduled Meds: . sodium chloride   Intravenous Once  . amiodarone  400 mg Oral BID  . aspirin EC  325 mg Oral Daily  . bisacodyl  10 mg Oral Daily  . docusate sodium  200 mg Oral Daily  . enoxaparin (LOVENOX)  injection  40 mg Subcutaneous QHS  . ferrous Q000111Q C-folic acid  1 capsule Oral TID PC  . furosemide  40 mg Oral Daily  . insulin aspart  0-24 Units Subcutaneous TID AC & HS  . insulin aspart  6 Units Subcutaneous TID WC  . insulin detemir  30 Units Subcutaneous Daily  . insulin detemir  30 Units Subcutaneous QHS  . latanoprost  1 drop Both Eyes QHS  . lisinopril  2.5 mg Oral Daily  . mouth rinse  15 mL Mouth Rinse BID  . metoprolol tartrate  25 mg Oral BID  . pantoprazole  40 mg Oral Daily  . rosuvastatin  20 mg Oral q1800  . sodium chloride flush  3 mL Intravenous Q12H  . sodium chloride flush  3 mL Intravenous Q12H   Continuous Infusions: . sodium chloride Stopped (06/28/16 1100)  . sodium chloride    . sodium chloride 20 mL/hr at 06/27/16 1900  . lactated ringers Stopped (06/28/16 1700)  . lactated ringers 20 mL/hr at 06/27/16 1900   PRN Meds:.sodium chloride, sodium chloride, lactated ringers, magnesium hydroxide, ondansetron (ZOFRAN) IV, oxyCODONE, sodium chloride flush, sodium chloride flush  Xrays No results found.  Assessment/Plan: S/P Procedure(s) (LRB): CORONARY ARTERY BYPASS GRAFTING times four using left internal mammary  artery and right leg saphenous vein (N/A) TRANSESOPHAGEAL ECHOCARDIOGRAM (TEE) (N/A) Plan for discharge: see discharge orders Replace K+  LOS: 9 days    GOLD,[name] E 07/03/2016 patient examined and medical record reviewed,agree with above note. Tharon Aquas Trigt III 07/03/2016

## 2016-07-03 NOTE — Progress Notes (Signed)
07/03/2016 12:01 PM Chest tube sutures removed, pt tolerated well. Carney Corners

## 2016-07-03 NOTE — Progress Notes (Signed)
07/03/2016 12:30 PM Discharge AVS meds taken today and those due this evening reviewed.  Follow-up appointments and when to call md reviewed.  D/C IV and TELE.  Questions and concerns addressed.   D/C home per orders. Carney Corners

## 2016-07-03 NOTE — Care Management Note (Signed)
Case Management Note Marvetta Gibbons RN, BSN Unit 2W-Case Manager [phone]  Patient Details  Name: Lawrence [name] MRN: [id number] Date of Birth: Jan 08, 1951  Subjective/Objective:  Pt s/p CABGx4                  Action/Plan: PTA pt lived at home with wife- plan to return home with wife- no CM needs noted.   Expected Discharge Date:     07/03/16             Expected Discharge Plan:  Home/Self Care  In-House Referral:     Discharge planning Services  CM Consult  Post Acute Care Choice:    Choice offered to:     DME Arranged:    DME Agency:     HH Arranged:    HH Agency:     Status of Service:  Completed, signed off  If discussed at [employer] of Stay Meetings, dates discussed:    Additional Comments:  Dawayne Patricia, RN 07/03/2016, 11:19 AM

## 2016-07-03 NOTE — Care Management Important Message (Signed)
Important Message  Patient Details  Name: Lawrence [name] MRN: [id number] Date of Birth: [birth year]   Medicare Important Message Given:  Yes    [name] May 07/03/2016, 10:47 AM

## 2016-07-03 NOTE — Progress Notes (Signed)
CARDIAC REHAB PHASE I   Ed completed with pt and wife. Voiced understanding and requests his referral be sent to [facility]. Set up d/c video for pt and wife.  [id number]  [place], ACSM 07/03/2016 11:05 AM

## 2016-07-04 ENCOUNTER — Telehealth: Payer: Self-pay

## 2016-07-04 NOTE — Telephone Encounter (Signed)
Transition Care Management Follow-up Telephone Call   Date discharged?07/03/16  How have you been since you were released from the hospital?[place] DPR signed said pt is napping and has been feeling pretty good; pt wants drapes open on windows or feels closed in. Do you understand why you were in the hospital? Yes  Do you understand the discharge instructions?Yes   Where were you discharged to?Home   Items Reviewed:  Medications reviewed:Yes  Allergies reviewed:Yes  Dietary changes reviewed:Yes Referrals reviewed:Yes pt has appt with Dr Prescott Gum III 07/30/16 @12 :30.   Functional Questionnaire:  Activities of Daily Living (ADLs):   He states they are independent in the following:dressing;grooming;bathing and hygiene;continence and ambulation States they require assistance with the following:none  Any transportation issues/concerns?:No  Any patient concerns? No   Confirmed importance and date/time of follow-up visits scheduled 07/17/16 at 1:45  Provider Appointment booked with Allie Bossier NP  Confirmed with patient if condition begins to worsen call PCP or go to the ER.  Patient was given the office number and encouraged to call back with question or concerns.  : Yes

## 2016-07-04 NOTE — Telephone Encounter (Signed)
noted 

## 2016-07-08 ENCOUNTER — Ambulatory Visit: Payer: Medicare Other | Admitting: Sports Medicine

## 2016-07-14 ENCOUNTER — Ambulatory Visit (INDEPENDENT_AMBULATORY_CARE_PROVIDER_SITE_OTHER): Payer: Medicare Other | Admitting: Primary Care

## 2016-07-14 ENCOUNTER — Telehealth: Payer: Self-pay | Admitting: Primary Care

## 2016-07-14 ENCOUNTER — Encounter: Payer: Self-pay | Admitting: Primary Care

## 2016-07-14 VITALS — BP 154/82 | HR 74 | Temp 98.2°F | Ht 71.0 in | Wt 305.8 lb

## 2016-07-14 DIAGNOSIS — I200 Unstable angina: Secondary | ICD-10-CM

## 2016-07-14 DIAGNOSIS — L03115 Cellulitis of right lower limb: Secondary | ICD-10-CM | POA: Diagnosis not present

## 2016-07-14 MED ORDER — DOXYCYCLINE HYCLATE 100 MG PO TABS: 100.0000 mg | ORAL_TABLET | Freq: Two times a day (BID) | ORAL | 0 refills | Status: DC

## 2016-07-14 NOTE — Progress Notes (Signed)
Pre visit review using our clinic review tool, if applicable. No additional management support is needed unless otherwise documented below in the visit note. 

## 2016-07-14 NOTE — Telephone Encounter (Signed)
Noted and evaluated. 

## 2016-07-14 NOTE — Telephone Encounter (Signed)
[facility] Patient Name: Lawrence [name] DOB: April 18, 1951 Initial Comment Caller states removed a vein from leg before an operation on 8/25. The leg is red and pain. Nurse Assessment Nurse: Markus Daft, RN, Sherre Poot Date/Time (Eastern Time): 07/14/2016 8:35:14 AM Confirm and document reason for call. If symptomatic, describe symptoms. You must click the next button to save text entered. ---Caller states s/p CABG and removed a vein from leg for the graft on 06/27/16. The leg is red, warm, and painful. No fever. S/ S noticed before discharge. He asked about it, but was advised to "give it some time." Looks about the same, and other times more angry than other times. He feels a little more painful that it should be. Redness extends beyond the incision toward the ankle. -- Blood sugars are staying elevated. Today at 4:50 am, fasting blood sugar was 498. Novolog 25 units this AM. Lantus 20 units last night. 7:15 am blood sugar 321. 8:40 am blood sugar 316. He had breakfast of 2 Eggo waffles with a little jelly (47 carbs). No protein this AM. RN advised that he needs to have some protein. He verbalizes understanding. Has the patient traveled out of the country within the last 30 days? ---Not Applicable Does the patient have any new or worsening symptoms? ---Yes Will a triage be completed? ---Yes Related visit to physician within the last 2 weeks? ---Yes Does the PT have any chronic conditions? (i.e. diabetes, asthma, etc.) ---Yes List chronic conditions. ---recently CP/back pain/arm pain and dry heaves (r/o MI) -- CAD and s/p CABG 06/27/16, h/o left knee replacement in April and had cellulites in left knee, IDDM, HTN Is this a behavioral health or substance abuse call? ---No Guidelines Guideline Title Affirmed Question Affirmed Notes Post-Op Incision Symptoms [1] Incision looks infected (spreading redness,  pain) AND [2] large red area (> 2 in. or 5 cm) Final Disposition User See Physician within 4 Hours (or PCP triage) Markus Daft, RN, [place] Referrals REFERRED TO PCP OFFICE Disagree/Comply: Comply Appt set for today with PCP at 11:30 am

## 2016-07-14 NOTE — Progress Notes (Signed)
Subjective:    Patient ID: Lawrence [name], male    DOB: [birth year], 65 y.o.   MRN: [id number]  HPI  Lawrence [name] is a 65 year old male with a history of type 2 diabetes who presents today with a chief complaint of leg painAnd erythema. His symptoms are located to the right lower extremity. He is status post CABG x 4 using left internal mammary artery and right leg saphenous vein on 06/27/16. Last post op evaluation on 08/31/17fFor which he experienced some erythema to his right lower extremity.   Since his postop evaluation he has continued to notice an increase in erythema, swelling, and discomfort. This initiaally began during his hospitalization for CABG on 08/25. Denies fevers, chills, fatigue.   2) Hyperglycemia: Currently   Review of Systems  Constitutional: Negative for fatigue and fever.  Skin: Positive for color change and wound.  Neurological: Negative for weakness.       Past Medical History:  Diagnosis Date  . CAD (coronary artery disease)    07/2007  . Diabetes ([place])   . Elevated lipids   . Glaucoma   . HBP (high blood pressure)   . Neuropathy ([place])   . Osteoarthritis   . Skin cancer   . Sleep apnea    CPAP     Social History   Social History  . Marital status: Married    Spouse name: N/A  . Number of children: N/A  . Years of education: N/A   Occupational History  . Not on file.   Social History Main Topics  . Smoking status: Former Smoker    Packs/day: 1.00    Years: 16.00    Types: Cigarettes    Start date: 11/03/1969    Quit date: 07/22/1986  . Smokeless tobacco: Never Used  . Alcohol use 0.0 oz/week     Comment: OCCASIONALLY  . Drug use: No  . Sexual activity: Not on file   Other Topics Concern  . Not on file   Social History Narrative   Married.  Lives at home with wife.     Past Surgical History:  Procedure Laterality Date  . APPENDECTOMY    . CARDIAC CATHETERIZATION N/A 06/11/2016   Procedure: Left Heart Cath and Coronary  Angiography;  Surgeon: Corey Skains, MD;  Location: [facility];  Service: Cardiovascular;  Laterality: N/A;  . CARPAL TUNNEL RELEASE Left 04/24/2016   Procedure: CARPAL TUNNEL RELEASE;  Surgeon: Hessie Knows, MD;  Location: [facility];  Service: Orthopedics;  Laterality: Left;  . CATARACT EXTRACTION W/ INTRAOCULAR LENS  IMPLANT, BILATERAL Bilateral   . CATARACT EXTRACTION, BILATERAL    . CORONARY ARTERY BYPASS GRAFT N/A 06/27/2016   Procedure: CORONARY ARTERY BYPASS GRAFTING times four using left internal mammary artery and right leg saphenous vein;  Surgeon: Ivin Poot, MD;  Location: [facility];  Service: Open Heart Surgery;  Laterality: N/A;  . EYE SURGERY Bilateral   . JOINT REPLACEMENT    . KNEE ARTHROPLASTY Left 02/11/2016   Procedure: COMPUTER ASSISTED TOTAL KNEE ARTHROPLASTY;  Surgeon: Dereck Leep, MD;  Location: [facility];  Service: Orthopedics;  Laterality: Left;  . KNEE ARTHROSCOPY Right   . TEE WITHOUT CARDIOVERSION N/A 06/27/2016   Procedure: TRANSESOPHAGEAL ECHOCARDIOGRAM (TEE);  Surgeon: Ivin Poot, MD;  Location: [facility];  Service: Open Heart Surgery;  Laterality: N/A;    Family History  Problem Relation Age of Onset  . Hypertension Mother   . Lupus Mother   .  Hypertension Father   . Diabetes Father   . Heart attack Father 75    died in his 63s  . Arthritis Maternal Grandmother   . Arthritis Maternal Grandfather   . Arthritis Paternal Grandmother   . Arthritis Paternal Grandfather     Allergies  Allergen Reactions  . Nitroglycerin Nausea Only and Other (See Comments)    Patches only - headache  . Statins Other (See Comments)    BODY PAIN   . Lipitor [Atorvastatin] Other (See Comments) and Rash    BODY PAIN  . Lisinopril Other (See Comments)    Light headed, dizzy    Current Outpatient Prescriptions on File Prior to Visit  Medication Sig Dispense Refill  . amiodarone (PACERONE) 200 MG tablet Take 1 tablet (200 mg total) by mouth 2 (two) times  daily. 60 tablet 1  . aspirin EC 325 MG EC tablet Take 1 tablet (325 mg total) by mouth daily.    . cetirizine (ZYRTEC) 10 MG tablet Take 10 mg by mouth daily.    . cholecalciferol (VITAMIN D) 1000 units tablet Take 1,000 Units by mouth daily.    [name] docusate sodium (COLACE) 100 MG capsule Take 200 mg by mouth 2 (two) times daily.    . ferrous Q000111Q C-folic acid (TRINSICON / FOLTRIN) capsule Take 1 capsule by mouth 3 (three) times daily after meals. 90 capsule 1  . furosemide (LASIX) 40 MG tablet Take 1 tablet (40 mg total) by mouth daily. 30 tablet 1  . glucagon (GLUCAGON EMERGENCY) 1 MG injection Inject 1 mg into the vein once as needed. Reported on 02/11/2016    . insulin aspart (NOVOLOG) 100 UNIT/ML injection Inject 20 Units into the skin 3 (three) times daily. (Patient taking differently: Inject 20 Units into the skin 3 (three) times daily with meals. ) 10 mL 5  . insulin glargine (LANTUS) 100 UNIT/ML injection Inject 20 Units into the skin at bedtime.     [name] latanoprost (XALATAN) 0.005 % ophthalmic solution Place 1 drop into both eyes at bedtime.    . metoprolol tartrate (LOPRESSOR) 25 MG tablet Take 25 mg by mouth 2 (two) times daily.    . Multiple Vitamin (MULTIVITAMIN) capsule Take 1 capsule by mouth daily.    [name] oxyCODONE (OXY IR/ROXICODONE) 5 MG immediate release tablet Take 1-2 tablets (5-10 mg total) by mouth every 6 (six) hours as needed for severe pain. 30 tablet 0  . potassium chloride SA (K-DUR,KLOR-CON) 20 MEQ tablet Take 1 tablet (20 mEq total) by mouth daily. 30 tablet 1  . rosuvastatin (CRESTOR) 20 MG tablet Take 1 tablet (20 mg total) by mouth daily at 6 PM. 30 tablet 1  . vitamin C (ASCORBIC ACID) 500 MG tablet Take 500 mg by mouth 2 (two) times daily.      No current facility-administered medications on file prior to visit.     BP (!) 154/82   Pulse 74   Temp 98.2 F (36.8 C) (Oral)   Ht 5\' 11"  (1.803 m)   Wt (!) 305 lb 12.8 oz (138.7 kg)   SpO2 94%   BMI  42.65 kg/m    Objective:   Physical Exam  Constitutional: He appears well-nourished.  Cardiovascular: Normal rate and regular rhythm.   Pulmonary/Chest: Effort normal and breath sounds normal.  Skin: Skin is warm and dry. There is erythema.  Moderate erythema with discomfort to right lower extremity.          Assessment & Plan:  Cellulitis:  Erythema, discomfort, swelling to right lower extremity since August 25 after CABG using graft of right saphenous vein. Exam today with evidence of moderate cellulitis to right lower extremity. Given history of diabetes, progressive swelling, and presentation, will treat. Prescription for doxycycline 10 day course sent to pharmacy. Discussed home treatment. Follow-up in one week for recheck, or sooner if no improvement in the next 3 days.  Sheral Flow, NP

## 2016-07-14 NOTE — Telephone Encounter (Signed)
Pt has appt with Allie Bossier NP 07/14/16 at 11:30.

## 2016-07-14 NOTE — Patient Instructions (Signed)
Start Doxycycline 100 mg tablets. Take 1 tablet by mouth twice daily for 10 days.  Follow up in 1 week for re-evaluation or sooner if no improvement.  It was a pleasure to see you today!

## 2016-07-17 ENCOUNTER — Ambulatory Visit: Payer: Medicare Other | Admitting: Primary Care

## 2016-07-21 ENCOUNTER — Ambulatory Visit (INDEPENDENT_AMBULATORY_CARE_PROVIDER_SITE_OTHER): Payer: Medicare Other | Admitting: Primary Care

## 2016-07-21 ENCOUNTER — Encounter: Payer: Self-pay | Admitting: Primary Care

## 2016-07-21 DIAGNOSIS — I200 Unstable angina: Secondary | ICD-10-CM

## 2016-07-21 DIAGNOSIS — R600 Localized edema: Secondary | ICD-10-CM | POA: Diagnosis not present

## 2016-07-21 NOTE — Progress Notes (Signed)
Subjective:    Patient ID: Lawrence [name], male    DOB: February 27, 1951, 65 y.o.   MRN: [id number]  HPI  Lawrence [name] is a 65 year old male who presents today for follow up of extremity erythema. He was evaluated one week ago and diagnosed with acute cellulitis to the right lower extremity. He had noticed erythema and discomfort to his right lower extremity gradually since hospitalization for CABG where they grafted from the right lower extremity. He was treated with Doxycycline antibiotics and encouraged to follow up.  Since his last visit he's been compliant to the Doxycycline. He's not noticed improvement to the erythema or discomfort. He has noticed increased swelling to his lower extremities, and new pain to the right lateral and plantar foot that has been present since Friday. He denies fevers, chills, weakness, fatigue, increased erythema/pain.  Review of Systems  Constitutional: Negative for chills, fatigue and fever.  Respiratory: Negative for shortness of breath.   Cardiovascular: Positive for leg swelling. Negative for chest pain.  Skin: Positive for color change.  Neurological: Negative for dizziness.       Past Medical History:  Diagnosis Date  . CAD (coronary artery disease)    07/2007  . Diabetes ([place])   . Elevated lipids   . Glaucoma   . HBP (high blood pressure)   . Neuropathy ([place])   . Osteoarthritis   . Skin cancer   . Sleep apnea    CPAP     Social History   Social History  . Marital status: Married    Spouse name: N/A  . Number of children: N/A  . Years of education: N/A   Occupational History  . Not on file.   Social History Main Topics  . Smoking status: Former Smoker    Packs/day: 1.00    Years: 16.00    Types: Cigarettes    Start date: 11/03/1969    Quit date: 07/22/1986  . Smokeless tobacco: Never Used  . Alcohol use 0.0 oz/week     Comment: OCCASIONALLY  . Drug use: No  . Sexual activity: Not on file   Other Topics Concern  . Not on file     Social History Narrative   Married.  Lives at home with wife.     Past Surgical History:  Procedure Laterality Date  . APPENDECTOMY    . CARDIAC CATHETERIZATION N/A 06/11/2016   Procedure: Left Heart Cath and Coronary Angiography;  Surgeon: Corey Skains, MD;  Location: [facility];  Service: Cardiovascular;  Laterality: N/A;  . CARPAL TUNNEL RELEASE Left 04/24/2016   Procedure: CARPAL TUNNEL RELEASE;  Surgeon: Hessie Knows, MD;  Location: [facility];  Service: Orthopedics;  Laterality: Left;  . CATARACT EXTRACTION W/ INTRAOCULAR LENS  IMPLANT, BILATERAL Bilateral   . CATARACT EXTRACTION, BILATERAL    . CORONARY ARTERY BYPASS GRAFT N/A 06/27/2016   Procedure: CORONARY ARTERY BYPASS GRAFTING times four using left internal mammary artery and right leg saphenous vein;  Surgeon: Ivin Poot, MD;  Location: [facility];  Service: Open Heart Surgery;  Laterality: N/A;  . EYE SURGERY Bilateral   . JOINT REPLACEMENT    . KNEE ARTHROPLASTY Left 02/11/2016   Procedure: COMPUTER ASSISTED TOTAL KNEE ARTHROPLASTY;  Surgeon: Dereck Leep, MD;  Location: [facility];  Service: Orthopedics;  Laterality: Left;  . KNEE ARTHROSCOPY Right   . TEE WITHOUT CARDIOVERSION N/A 06/27/2016   Procedure: TRANSESOPHAGEAL ECHOCARDIOGRAM (TEE);  Surgeon: Ivin Poot, MD;  Location: [facility];  Service: Open Heart Surgery;  Laterality: N/A;    Family History  Problem Relation Age of Onset  . Hypertension Mother   . Lupus Mother   . Hypertension Father   . Diabetes Father   . Heart attack Father 62    died in his 36s  . Arthritis Maternal Grandmother   . Arthritis Maternal Grandfather   . Arthritis Paternal Grandmother   . Arthritis Paternal Grandfather     Allergies  Allergen Reactions  . Nitroglycerin Nausea Only and Other (See Comments)    Patches only - headache  . Statins Other (See Comments)    BODY PAIN   . Lipitor [Atorvastatin] Other (See Comments) and Rash    BODY PAIN  . Lisinopril Other  (See Comments)    Light headed, dizzy    Current Outpatient Prescriptions on File Prior to Visit  Medication Sig Dispense Refill  . amiodarone (PACERONE) 200 MG tablet Take 1 tablet (200 mg total) by mouth 2 (two) times daily. 60 tablet 1  . aspirin EC 325 MG EC tablet Take 1 tablet (325 mg total) by mouth daily.    . cetirizine (ZYRTEC) 10 MG tablet Take 10 mg by mouth daily.    . cholecalciferol (VITAMIN D) 1000 units tablet Take 1,000 Units by mouth daily.    [name] docusate sodium (COLACE) 100 MG capsule Take 200 mg by mouth 2 (two) times daily.    [name] doxycycline (VIBRA-TABS) 100 MG tablet Take 1 tablet (100 mg total) by mouth 2 (two) times daily. 20 tablet 0  . ferrous Q000111Q C-folic acid (TRINSICON / FOLTRIN) capsule Take 1 capsule by mouth 3 (three) times daily after meals. 90 capsule 1  . furosemide (LASIX) 40 MG tablet Take 1 tablet (40 mg total) by mouth daily. 30 tablet 1  . insulin aspart (NOVOLOG) 100 UNIT/ML injection Inject 20 Units into the skin 3 (three) times daily. (Patient taking differently: Inject 20 Units into the skin 3 (three) times daily with meals. ) 10 mL 5  . insulin glargine (LANTUS) 100 UNIT/ML injection Inject 20 Units into the skin at bedtime.     [name] latanoprost (XALATAN) 0.005 % ophthalmic solution Place 1 drop into both eyes at bedtime.    . metoprolol tartrate (LOPRESSOR) 25 MG tablet Take 25 mg by mouth 2 (two) times daily.    . Multiple Vitamin (MULTIVITAMIN) capsule Take 1 capsule by mouth daily.    [name] oxyCODONE (OXY IR/ROXICODONE) 5 MG immediate release tablet Take 1-2 tablets (5-10 mg total) by mouth every 6 (six) hours as needed for severe pain. 30 tablet 0  . potassium chloride SA (K-DUR,KLOR-CON) 20 MEQ tablet Take 1 tablet (20 mEq total) by mouth daily. 30 tablet 1  . rosuvastatin (CRESTOR) 20 MG tablet Take 1 tablet (20 mg total) by mouth daily at 6 PM. 30 tablet 1  . vitamin C (ASCORBIC ACID) 500 MG tablet Take 500 mg by mouth 2 (two) times  daily.     [name] glucagon (GLUCAGON EMERGENCY) 1 MG injection Inject 1 mg into the vein once as needed. Reported on 02/11/2016     No current facility-administered medications on file prior to visit.     BP 116/72   Pulse 65   Temp 98.1 F (36.7 C) (Oral)   Ht 5\' 11"  (1.803 m)   Wt (!) 300 lb 12.8 oz (136.4 kg)   SpO2 95%   BMI 41.95 kg/m    Objective:   Physical Exam  Constitutional:  He appears well-nourished. He does not appear ill.  Neck: Neck supple.  Cardiovascular: Normal rate and regular rhythm.   1+ pitting bilaterally.  Pulmonary/Chest: Effort normal and breath sounds normal.  No crackles to the bases  Skin: Skin is warm and dry.  Moderate erythema to right lower extremity, slightly improved but overall no change.          Assessment & Plan:  Lower extremity erythema:  Present to right lower extremity since CABG in late August 2017. Treated last week for acute cellulitis given moderate erythema with pain. Compliant to doxycycline, however no significant improvement. Upon reevaluation today erythema appears to be vascular related. Discoloration also noted to toes of right lower extremity. Surgical site is intact and healing appropriately. Will increase Lasix in order to reduce swelling that is likely causing discomfort. He will follow-up with vascular surgery next week as schedule. Return precautions provided.  Sheral Flow, NP

## 2016-07-21 NOTE — Progress Notes (Signed)
Pre visit review using our clinic review tool, if applicable. No additional management support is needed unless otherwise documented below in the visit note. 

## 2016-07-21 NOTE — Assessment & Plan Note (Signed)
Increased swelling with 1+ pitting bilaterally. No abrupt changes in weight. Increase Lasix to 40 mg twice a day 4 days. Follow-up with vascular next week as scheduled.

## 2016-07-21 NOTE — Patient Instructions (Signed)
The redness to your right leg appears to be related to a vascular cause. Follow up with Dr. Nils Pyle as scheduled next week.  Increase your Lasix. Take 40 mg in the morning and 40 mg in the evening for the next 4 days.  Follow up with the podiatrist as discussed.  It was a pleasure to see you today!

## 2016-07-22 ENCOUNTER — Ambulatory Visit (INDEPENDENT_AMBULATORY_CARE_PROVIDER_SITE_OTHER): Payer: Medicare Other | Admitting: Podiatry

## 2016-07-22 ENCOUNTER — Ambulatory Visit: Payer: Medicare Other

## 2016-07-22 ENCOUNTER — Ambulatory Visit (INDEPENDENT_AMBULATORY_CARE_PROVIDER_SITE_OTHER): Payer: Medicare Other

## 2016-07-22 ENCOUNTER — Encounter: Payer: Self-pay | Admitting: Podiatry

## 2016-07-22 DIAGNOSIS — L97521 Non-pressure chronic ulcer of other part of left foot limited to breakdown of skin: Secondary | ICD-10-CM

## 2016-07-22 DIAGNOSIS — L03115 Cellulitis of right lower limb: Secondary | ICD-10-CM

## 2016-07-22 DIAGNOSIS — M79671 Pain in right foot: Secondary | ICD-10-CM | POA: Diagnosis not present

## 2016-07-22 DIAGNOSIS — L89891 Pressure ulcer of other site, stage 1: Secondary | ICD-10-CM | POA: Diagnosis not present

## 2016-07-22 DIAGNOSIS — I200 Unstable angina: Secondary | ICD-10-CM

## 2016-07-22 DIAGNOSIS — Z951 Presence of aortocoronary bypass graft: Secondary | ICD-10-CM

## 2016-07-22 NOTE — Progress Notes (Signed)
Subjective: 65 year old male presents today for follow-up evaluation of a wound to the left second digit. Patient also has pain in the right foot for the last 4-5 days. Patient states that on August 25 had a quadruple bypass with saphenous vein harvesting of the right lower extremity.   Objective: Physical Exam General: The patient is alert and oriented x3 in no acute distress.  Dermatology: Skin is warm, dry and supple bilateral lower extremities. Negative for open lesions or macerations.  Vascular: Severe bilateral lower extremity edema-pitting erythema noted to the right lower extremity possibly consistent with saphenous vein harvesting. Palpable pedal pulses bilaterally.   Neurological: Epicritic and protective threshold grossly intact bilaterally.   Musculoskeletal Exam: Pain on palpation to the bilateral feet and ankles. Pain on palpation to the mid substance of the right plantar arch ligament.  Range of motion within normal limits to all pedal and ankle joints bilateral. Muscle strength 5/5 in all groups bilateral.   Assessment: #1 edema bilateral lower extremities #2 history of quadruple bypass #3 cellulitis right lower extremity.  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 #2 Medically necessary excisional debridement including partial skin thickness was performed to the second digit of the left foot using a tissue nipper. #3 the demarcation of the cellulitis of the right lower extremity was marked today using a pin. Discussed with patient that if the redness extends past the demarcated line he is to present immediately to the emergency department. Patient is to present to the emergency department if he  experiences fever nausea or chills. #4 patient is to return to clinic in 4 weeks     Dr. [name], [place]

## 2016-07-30 ENCOUNTER — Ambulatory Visit (INDEPENDENT_AMBULATORY_CARE_PROVIDER_SITE_OTHER): Payer: Self-pay | Admitting: Cardiothoracic Surgery

## 2016-07-30 ENCOUNTER — Encounter: Payer: Self-pay | Admitting: Cardiothoracic Surgery

## 2016-07-30 ENCOUNTER — Ambulatory Visit: Payer: Medicare Other | Admitting: Cardiothoracic Surgery

## 2016-07-30 ENCOUNTER — Other Ambulatory Visit: Payer: Self-pay | Admitting: *Deleted

## 2016-07-30 ENCOUNTER — Ambulatory Visit
Admission: RE | Admit: 2016-07-30 | Discharge: 2016-07-30 | Disposition: A | Discharge status: Home / Self Care | Payer: Medicare Other | Source: Ambulatory Visit | Attending: Cardiothoracic Surgery | Admitting: Cardiothoracic Surgery

## 2016-07-30 VITALS — BP 115/60 | HR 76 | Resp 16 | Ht 71.0 in | Wt 300.0 lb

## 2016-07-30 DIAGNOSIS — J90 Pleural effusion, not elsewhere classified: Secondary | ICD-10-CM | POA: Diagnosis not present

## 2016-07-30 DIAGNOSIS — Z951 Presence of aortocoronary bypass graft: Secondary | ICD-10-CM

## 2016-07-30 NOTE — Progress Notes (Signed)
PCP is Sheral Flow, NP Referring Provider is Corey Skains, MD  Chief Complaint  Patient presents with  . Routine Post Op    f/u from surgery with CXR s/p Coronary artery bypass grafting x4, 06/27/16  Postop visit after CABG 4 min August for unstable angina  HPI: Obese diabetic male returns for first office visit with chest x-ray after CABG 4 a month ago. He has had a slow recovery due to his morbid obesity and limited mobility. He walks with a cane. He denies recurrent angina. He denies orthopnea. The sternal incision is healing well. The right leg and a vein harvest site is dry with eschar and there is cellulitis of the pretibial soft tissue of the right lower extremity. He finished a course of oral vancomycin after being seen in the cardiology clinic. He wears support stockings. He is taking Lasix 40 mg daily. Chest x-ray today is clear. The patient had transient postop atrial fibrillation and was sent home on amiodarone which we will discontinue at this visit since he has maintained sinus rhythm.  Past Medical History:  Diagnosis Date  . CAD (coronary artery disease)    07/2007  . Diabetes ([place])   . Elevated lipids   . Glaucoma   . HBP (high blood pressure)   . Neuropathy ([place])   . Osteoarthritis   . Skin cancer   . Sleep apnea    CPAP    Past Surgical History:  Procedure Laterality Date  . APPENDECTOMY    . CARDIAC CATHETERIZATION N/A 06/11/2016   Procedure: Left Heart Cath and Coronary Angiography;  Surgeon: Corey Skains, MD;  Location: [place] [facility];  Service: Cardiovascular;  Laterality: N/A;  . CARPAL TUNNEL RELEASE Left 04/24/2016   Procedure: CARPAL TUNNEL RELEASE;  Surgeon: Hessie Knows, MD;  Location: [facility];  Service: Orthopedics;  Laterality: Left;  . CATARACT EXTRACTION W/ INTRAOCULAR LENS  IMPLANT, BILATERAL Bilateral   . CATARACT EXTRACTION, BILATERAL    . CORONARY ARTERY BYPASS GRAFT N/A 06/27/2016   Procedure: CORONARY ARTERY BYPASS  GRAFTING times four using left internal mammary artery and right leg saphenous vein;  Surgeon: Ivin Poot, MD;  Location: [facility];  Service: Open Heart Surgery;  Laterality: N/A;  . EYE SURGERY Bilateral   . JOINT REPLACEMENT    . KNEE ARTHROPLASTY Left 02/11/2016   Procedure: COMPUTER ASSISTED TOTAL KNEE ARTHROPLASTY;  Surgeon: Dereck Leep, MD;  Location: [facility];  Service: Orthopedics;  Laterality: Left;  . KNEE ARTHROSCOPY Right   . TEE WITHOUT CARDIOVERSION N/A 06/27/2016   Procedure: TRANSESOPHAGEAL ECHOCARDIOGRAM (TEE);  Surgeon: Ivin Poot, MD;  Location: [facility];  Service: Open Heart Surgery;  Laterality: N/A;    Family History  Problem Relation Age of Onset  . Hypertension Mother   . Lupus Mother   . Hypertension Father   . Diabetes Father   . Heart attack Father 43    died in his 35s  . Arthritis Maternal Grandmother   . Arthritis Maternal Grandfather   . Arthritis Paternal Grandmother   . Arthritis Paternal Grandfather     Social History Social History  Substance Use Topics  . Smoking status: Former Smoker    Packs/day: 1.00    Years: 16.00    Types: Cigarettes    Start date: 11/03/1969    Quit date: 07/22/1986  . Smokeless tobacco: Never Used  . Alcohol use 0.0 oz/week     Comment: OCCASIONALLY    Current Outpatient Prescriptions  Medication Sig Dispense Refill  . amiodarone (PACERONE) 200 MG tablet Take 1 tablet (200 mg total) by mouth 2 (two) times daily. 60 tablet 1  . aspirin EC 325 MG EC tablet Take 1 tablet (325 mg total) by mouth daily.    . cetirizine (ZYRTEC) 10 MG tablet Take 10 mg by mouth daily.    . cholecalciferol (VITAMIN D) 1000 units tablet Take 1,000 Units by mouth daily.    [name] docusate sodium (COLACE) 100 MG capsule Take 200 mg by mouth 2 (two) times daily.    . ferrous Q000111Q C-folic acid (TRINSICON / FOLTRIN) capsule Take 1 capsule by mouth 3 (three) times daily after meals. 90 capsule 1  . furosemide (LASIX) 40 MG  tablet Take 1 tablet (40 mg total) by mouth daily. 30 tablet 1  . glucagon (GLUCAGON EMERGENCY) 1 MG injection Inject 1 mg into the vein once as needed. Reported on 02/11/2016    . insulin aspart (NOVOLOG) 100 UNIT/ML injection Inject 20 Units into the skin 3 (three) times daily. (Patient taking differently: Inject 20 Units into the skin 3 (three) times daily with meals. ) 10 mL 5  . insulin glargine (LANTUS) 100 UNIT/ML injection Inject 20 Units into the skin at bedtime.     [name] latanoprost (XALATAN) 0.005 % ophthalmic solution Place 1 drop into both eyes at bedtime.    . metoprolol tartrate (LOPRESSOR) 25 MG tablet Take 25 mg by mouth 2 (two) times daily.    . Multiple Vitamin (MULTIVITAMIN) capsule Take 1 capsule by mouth daily.    [name] oxyCODONE (OXY IR/ROXICODONE) 5 MG immediate release tablet Take 1-2 tablets (5-10 mg total) by mouth every 6 (six) hours as needed for severe pain. 30 tablet 0  . potassium chloride SA (K-DUR,KLOR-CON) 20 MEQ tablet Take 1 tablet (20 mEq total) by mouth daily. 30 tablet 1  . rosuvastatin (CRESTOR) 20 MG tablet Take 1 tablet (20 mg total) by mouth daily at 6 PM. 30 tablet 1  . vitamin C (ASCORBIC ACID) 500 MG tablet Take 500 mg by mouth 2 (two) times daily.      No current facility-administered medications for this visit.     Allergies  Allergen Reactions  . Nitroglycerin Nausea Only and Other (See Comments)    Patches only - headache  . Statins Other (See Comments)    BODY PAIN   . Lipitor [Atorvastatin] Other (See Comments) and Rash    BODY PAIN  . Lisinopril Other (See Comments)    Light headed, dizzy    Review of Systems  No fever No sternal click Positive right lower extremity edema tenderness and redness  BP 115/60   Pulse 76   Resp 16   Ht 5\' 11"  (1.803 m)   Wt 300 lb (136.1 kg)   SpO2 96% Comment: RA  BMI 41.84 kg/m  Physical Exam       Exam    General- alert and comfortable   Lungs- clear without rales, wheezes   Cor- regular rate  and rhythm, no murmur , gallop   Abdomen- soft, non-tender   Extremities - warm, right lower extremity red and tender with edema and cellulitis. No drainage from the incision for [facility]   Neuro- oriented, appropriate, no focal weakness   Diagnostic Tests: Chest x-ray clear  Impression: Doing very well except the patient needs to walk more. He is cleared to start driving and we will facilitate referral to outpatient cardiac rehabilitation at [facility] He will be  started back on doxycycline for his lower extremity cellulitis and continue with oral Lasix. He'll return for a office visit for leg cellulitis on Monday, October 9  Plan: Stop amiodarone. Continue Lasix potassium aspirin metoprolol and Crestor for CABG and CAD Return for leg cellulitis evaluation October 9  Len Childs, MD [facility] [phone]

## 2016-07-31 ENCOUNTER — Other Ambulatory Visit: Payer: Self-pay | Admitting: *Deleted

## 2016-07-31 DIAGNOSIS — Z951 Presence of aortocoronary bypass graft: Secondary | ICD-10-CM

## 2016-08-04 ENCOUNTER — Ambulatory Visit: Payer: Medicare Other

## 2016-08-11 ENCOUNTER — Ambulatory Visit: Payer: Medicare Other

## 2016-08-13 ENCOUNTER — Ambulatory Visit (INDEPENDENT_AMBULATORY_CARE_PROVIDER_SITE_OTHER): Payer: Self-pay

## 2016-08-13 DIAGNOSIS — L03119 Cellulitis of unspecified part of limb: Secondary | ICD-10-CM

## 2016-08-13 DIAGNOSIS — Z951 Presence of aortocoronary bypass graft: Secondary | ICD-10-CM

## 2016-08-18 DIAGNOSIS — I10 Essential (primary) hypertension: Secondary | ICD-10-CM | POA: Diagnosis not present

## 2016-08-18 DIAGNOSIS — R600 Localized edema: Secondary | ICD-10-CM | POA: Diagnosis not present

## 2016-08-18 DIAGNOSIS — I25708 Atherosclerosis of coronary artery bypass graft(s), unspecified, with other forms of angina pectoris: Secondary | ICD-10-CM | POA: Diagnosis not present

## 2016-08-19 DIAGNOSIS — Z96652 Presence of left artificial knee joint: Secondary | ICD-10-CM | POA: Diagnosis not present

## 2016-09-03 ENCOUNTER — Other Ambulatory Visit: Payer: Self-pay | Admitting: Primary Care

## 2016-09-03 DIAGNOSIS — E119 Type 2 diabetes mellitus without complications: Secondary | ICD-10-CM

## 2016-09-03 DIAGNOSIS — Z1159 Encounter for screening for other viral diseases: Secondary | ICD-10-CM

## 2016-09-03 DIAGNOSIS — Z125 Encounter for screening for malignant neoplasm of prostate: Secondary | ICD-10-CM

## 2016-09-03 DIAGNOSIS — E785 Hyperlipidemia, unspecified: Secondary | ICD-10-CM

## 2016-09-03 DIAGNOSIS — D649 Anemia, unspecified: Secondary | ICD-10-CM

## 2016-09-05 ENCOUNTER — Other Ambulatory Visit: Payer: Medicare Other

## 2016-09-05 ENCOUNTER — Ambulatory Visit (INDEPENDENT_AMBULATORY_CARE_PROVIDER_SITE_OTHER): Payer: Medicare Other

## 2016-09-05 VITALS — BP 120/58 | HR 72 | Temp 98.4°F | Ht 69.0 in | Wt 293.5 lb

## 2016-09-05 DIAGNOSIS — Z0000 Encounter for general adult medical examination without abnormal findings: Secondary | ICD-10-CM | POA: Diagnosis not present

## 2016-09-05 NOTE — Progress Notes (Signed)
Pre visit review using our clinic review tool, if applicable. No additional management support is needed unless otherwise documented below in the visit note. 

## 2016-09-05 NOTE — Patient Instructions (Signed)
[name] , Thank you for taking time to come for your Medicare Wellness Visit. I appreciate your ongoing commitment to your health goals. Please review the following plan we discussed and let me know if I can assist you in the future.   These are the goals we discussed: Goals    . Increase water intake          Starting 09/05/2016, I will attempt to drink at least 6-8 glasses of water daily.        This is a list of the screening recommended for you and due dates:  Health Maintenance  Topic Date Due  . Complete foot exam   09/08/2016*  . Hemoglobin A1C  09/08/2016*  . Urine Protein Check  09/08/2016*  .  Hepatitis C: One time screening is recommended by Center for Disease Control  (CDC) for  adults born from [birth year] through 1967.   09/08/2016*  . Colon Cancer Screening  09/05/2017*  . Shingles Vaccine  09/05/2017*  . Tetanus Vaccine  09/05/2017*  . Pneumonia vaccines (1 of 2 - PCV13) 09/05/2017*  . Eye exam for diabetics  08/26/2017  . Flu Shot  Addressed  *Topic was postponed. The date shown is not the original due date.   Preventive Care for Adults  A healthy lifestyle and preventive care can promote health and wellness. Preventive health guidelines for adults include the following key practices.  . A routine yearly physical is a good way to check with your health care provider about your health and preventive screening. It is a chance to share any concerns and updates on your health and to receive a thorough exam.  . Visit your dentist for a routine exam and preventive care every 6 months. Brush your teeth twice a day and floss once a day. Good oral hygiene prevents tooth decay and gum disease.  . The frequency of eye exams is based on your age, health, family medical history, use  of contact lenses, and other factors. Follow your health care provider's ecommendations for frequency of eye exams.  . Eat a healthy diet. Foods like vegetables, fruits, whole grains, low-fat dairy  products, and lean protein foods contain the nutrients you need without too many calories. Decrease your intake of foods high in solid fats, added sugars, and salt. Eat the right amount of calories for you. Get information about a proper diet from your health care provider, if necessary.  . Regular physical exercise is one of the most important things you can do for your health. Most adults should get at least 150 minutes of moderate-intensity exercise (any activity that increases your heart rate and causes you to sweat) each week. In addition, most adults need muscle-strengthening exercises on 2 or more days a week.  Silver Sneakers may be a benefit available to you. To determine eligibility, you may visit the website: www.silversneakers.com or contact program at (330)823-4769 Mon-Fri between 8AM-8PM.   . Maintain a healthy weight. The body mass index (BMI) is a screening tool to identify possible weight problems. It provides an estimate of body fat based on height and weight. Your health care provider can find your BMI and can help you achieve or maintain a healthy weight.   For adults 20 years and older: ? A BMI below 18.5 is considered underweight. ? A BMI of 18.5 to 24.9 is normal. ? A BMI of 25 to 29.9 is considered overweight. ? A BMI of 30 and above is considered obese.   [name]  Maintain normal blood lipids and cholesterol levels by exercising and minimizing your intake of saturated fat. Eat a balanced diet with plenty of fruit and vegetables. Blood tests for lipids and cholesterol should begin at age 80 and be repeated every 5 years. If your lipid or cholesterol levels are high, you are over 50, or you are at high risk for heart disease, you may need your cholesterol levels checked more frequently. Ongoing high lipid and cholesterol levels should be treated with medicines if diet and exercise are not working.  . If you smoke, find out from your health care provider how to quit. If you do not  use tobacco, please do not start.  . If you choose to drink alcohol, please do not consume more than 2 drinks per day. One drink is considered to be 12 ounces (355 mL) of beer, 5 ounces (148 mL) of wine, or 1.5 ounces (44 mL) of liquor.  . If you are 71-48 years old, ask your health care provider if you should take aspirin to prevent strokes.  . Use sunscreen. Apply sunscreen liberally and repeatedly throughout the day. You should seek shade when your shadow is shorter than you. Protect yourself by wearing long sleeves, pants, a wide-brimmed hat, and sunglasses year round, whenever you are outdoors.  . Once a month, do a whole body skin exam, using a mirror to look at the skin on your back. Tell your health care provider of new moles, moles that have irregular borders, moles that are larger than a pencil eraser, or moles that have changed in shape or color.

## 2016-09-05 NOTE — Progress Notes (Signed)
Subjective:   Lawrence [name] is a 65 y.o. male who presents for an Initial Medicare Annual Wellness Visit.  Review of Systems  N/A Cardiac Risk Factors include: advanced age (>57men, >87 women);obesity (BMI >30kg/m2);male gender;diabetes mellitus;dyslipidemia;hypertension    Objective:    Today's Vitals   09/05/16 1305  BP: (!) 120/58  Pulse: 72  Temp: 98.4 F (36.9 C)  TempSrc: Oral  SpO2: 97%  Weight: 293 lb 8 oz (133.1 kg)  Height: 5\' 9"  (1.753 m)  PainSc: 0-No pain   Body mass index is 43.34 kg/m.  Current Medications (verified) Outpatient Encounter Prescriptions as of 09/05/2016  Medication Sig  . aspirin 81 MG EC tablet Take 81 mg by mouth daily. Swallow whole.  . cholecalciferol (VITAMIN D) 1000 units tablet Take 1,000 Units by mouth daily.  [name] docusate sodium (COLACE) 100 MG capsule Take 200 mg by mouth 2 (two) times daily.  . furosemide (LASIX) 40 MG tablet Take 1 tablet (40 mg total) by mouth daily.  [name] glucagon (GLUCAGON EMERGENCY) 1 MG injection Inject 1 mg into the vein once as needed. Reported on 02/11/2016  . insulin aspart (NOVOLOG) 100 UNIT/ML injection Inject 20 Units into the skin 3 (three) times daily. (Patient taking differently: Inject 20 Units into the skin 3 (three) times daily with meals. )  . insulin glargine (LANTUS) 100 UNIT/ML injection Inject 20 Units into the skin at bedtime.   [name] latanoprost (XALATAN) 0.005 % ophthalmic solution Place 1 drop into both eyes at bedtime.  [name] loratadine (CLARITIN) 10 MG tablet Take 10 mg by mouth daily.  . metoprolol tartrate (LOPRESSOR) 25 MG tablet Take 25 mg by mouth 2 (two) times daily.  . Multiple Vitamin (MULTIVITAMIN) capsule Take 1 capsule by mouth daily.  . potassium chloride SA (K-DUR,KLOR-CON) 20 MEQ tablet Take 1 tablet (20 mEq total) by mouth daily.  . rosuvastatin (CRESTOR) 20 MG tablet Take 1 tablet (20 mg total) by mouth daily at 6 PM.  . vitamin C (ASCORBIC ACID) 500 MG tablet Take 500 mg by mouth 2  (two) times daily.   . [DISCONTINUED] amiodarone (PACERONE) 200 MG tablet Take 1 tablet (200 mg total) by mouth 2 (two) times daily.  . [DISCONTINUED] aspirin EC 325 MG EC tablet Take 1 tablet (325 mg total) by mouth daily.  . [DISCONTINUED] cetirizine (ZYRTEC) 10 MG tablet Take 10 mg by mouth daily.  . [DISCONTINUED] ferrous Q000111Q C-folic acid (TRINSICON / FOLTRIN) capsule Take 1 capsule by mouth 3 (three) times daily after meals.  . [DISCONTINUED] oxyCODONE (OXY IR/ROXICODONE) 5 MG immediate release tablet Take 1-2 tablets (5-10 mg total) by mouth every 6 (six) hours as needed for severe pain. (Patient not taking: Reported on 09/05/2016)   No facility-administered encounter medications on file as of 09/05/2016.     Allergies (verified) Nitroglycerin; Statins; Lipitor [atorvastatin]; and Lisinopril   History: Past Medical History:  Diagnosis Date  . CAD (coronary artery disease)    07/2007  . Diabetes ([place])   . Elevated lipids   . Glaucoma   . HBP (high blood pressure)   . Neuropathy ([place])   . Osteoarthritis   . Skin cancer   . Sleep apnea    CPAP   Past Surgical History:  Procedure Laterality Date  . APPENDECTOMY    . CARDIAC CATHETERIZATION N/A 06/11/2016   Procedure: Left Heart Cath and Coronary Angiography;  Surgeon: Corey Skains, MD;  Location: [place] [facility];  Service: Cardiovascular;  Laterality: N/A;  .  CARPAL TUNNEL RELEASE Left 04/24/2016   Procedure: CARPAL TUNNEL RELEASE;  Surgeon: Hessie Knows, MD;  Location: [facility];  Service: Orthopedics;  Laterality: Left;  . CATARACT EXTRACTION W/ INTRAOCULAR LENS  IMPLANT, BILATERAL Bilateral   . CATARACT EXTRACTION, BILATERAL    . CORONARY ARTERY BYPASS GRAFT N/A 06/27/2016   Procedure: CORONARY ARTERY BYPASS GRAFTING times four using left internal mammary artery and right leg saphenous vein;  Surgeon: Ivin Poot, MD;  Location: [place];  Service: Open Heart Surgery;  Laterality: N/A;  . EYE SURGERY  Bilateral   . JOINT REPLACEMENT    . KNEE ARTHROPLASTY Left 02/11/2016   Procedure: COMPUTER ASSISTED TOTAL KNEE ARTHROPLASTY;  Surgeon: Dereck Leep, MD;  Location: [facility];  Service: Orthopedics;  Laterality: Left;  . KNEE ARTHROSCOPY Right   . TEE WITHOUT CARDIOVERSION N/A 06/27/2016   Procedure: TRANSESOPHAGEAL ECHOCARDIOGRAM (TEE);  Surgeon: Ivin Poot, MD;  Location: [place];  Service: Open Heart Surgery;  Laterality: N/A;   Family History  Problem Relation Age of Onset  . Hypertension Mother   . Lupus Mother   . Hypertension Father   . Diabetes Father   . Heart attack Father 55    died in his 3s  . Arthritis Maternal Grandmother   . Arthritis Maternal Grandfather   . Arthritis Paternal Grandmother   . Arthritis Paternal Grandfather    Social History   Occupational History  . Not on file.   Social History Main Topics  . Smoking status: Former Smoker    Packs/day: 1.00    Years: 16.00    Types: Cigarettes    Start date: 11/03/1969    Quit date: 07/22/1986  . Smokeless tobacco: Never Used  . Alcohol use 0.0 oz/week     Comment: OCCASIONALLY  . Drug use: No  . Sexual activity: Not on file   Tobacco Counseling Counseling given: No   Activities of Daily Living In your present state of health, do you have any difficulty performing the following activities: 09/05/2016 06/24/2016  Hearing? N N  Vision? Y N  Difficulty concentrating or making decisions? Y N  Walking or climbing stairs? Y N  Dressing or bathing? N N  Doing errands, shopping? N N  Preparing Food and eating ? N -  Using the Toilet? N -  In the past six months, have you accidently leaked urine? N -  Do you have problems with loss of bowel control? N -  Managing your Medications? N -  Managing your Finances? N -  Housekeeping or managing your Housekeeping? N -  Some recent data might be hidden    Immunizations and Health Maintenance  There is no immunization history on file for this  patient. There are no preventive care reminders to display for this patient.  Patient Care Team: Pleas Koch, NP as PCP - General (Internal Medicine)    Assessment:   This is a routine wellness examination for Lawrence [name]   Hearing/Vision screen  Hearing Screening   125Hz  250Hz  500Hz  1000Hz  2000Hz  3000Hz  4000Hz  6000Hz  8000Hz   Right ear:   40 40 40  40    Left ear:   40 40 40  40    Vision Screening Comments: Last vision exam on 08/26/16 at [place] issues and exercise activities discussed: Current Exercise Habits: The patient does not participate in regular exercise at present, Exercise limited by: orthopedic condition(s)  Goals    . Increase water intake  Starting 09/05/2016, I will attempt to drink at least 6-8 glasses of water daily.       Depression Screen PHQ 2/9 Scores 09/05/2016  PHQ - 2 Score 0    Fall Risk Fall Risk  09/05/2016  Falls in the past year? Yes  Number falls in past yr: 2 or more  Injury with Fall? Yes  Risk Factor Category  High Fall Risk  Risk for fall due to : Impaired balance/gait;Impaired mobility;History of fall(s)  Follow up Falls evaluation completed;Falls prevention discussed    Cognitive Function: MMSE - Mini Mental State Exam 09/05/2016  Orientation to time 5  Orientation to Place 5  Registration 3  Attention/ Calculation 0  Recall 3  Language- name 2 objects 0  Language- repeat 1  Language- follow 3 step command 3  Language- read & follow direction 0  Write a sentence 0  Copy design 0  Total score 20     PLEASE NOTE: A Mini-Cog screen was completed. Maximum score is 20. A value of 0 denotes this part of [place] MMSE was not completed or the patient failed this part of the Mini-Cog screening.   Mini-Cog Screening Orientation to Time - Max 5 pts Orientation to Place - Max 5 pts Registration - Max 3 pts Recall - Max 3 pts Language Repeat - Max 1 pts Language Follow 3 Step Command - Max 3 pts      Screening Tests Health Maintenance  Topic Date Due  . FOOT EXAM  09/08/2016 (Originally 12/26/1960)  . HEMOGLOBIN A1C  09/08/2016 (Originally 08/13/2016)  . URINE MICROALBUMIN  09/08/2016 (Originally 12/26/1960)  . Hepatitis C Screening  09/08/2016 (Originally 1951-04-02)  . COLONOSCOPY  09/05/2017 (Originally 12/26/1998)  . ZOSTAVAX  09/05/2017 (Originally 12/26/2008)  . TETANUS/TDAP  09/05/2017 (Originally 12/27/1967)  . PNA vac Low Risk Adult (1 of 2 - PCV13) 09/05/2017 (Originally 12/26/2013)  . OPHTHALMOLOGY EXAM  08/26/2017  . INFLUENZA VACCINE  Addressed        Plan:     I have personally reviewed and addressed the Medicare Annual Wellness questionnaire and have noted the following in the patient's chart:  A. Medical and social history B. Use of alcohol, tobacco or illicit drugs  C. Current medications and supplements D. Functional ability and status E.  Nutritional status F.  Physical activity G. Advance directives H. List of other physicians I.  Hospitalizations, surgeries, and ER visits in previous 12 months J.  [place] to include hearing, vision, cognitive, depression L. Referrals and appointments - none  In addition, I have reviewed and discussed with patient certain preventive protocols, quality metrics, and best practice recommendations. A written personalized care plan for preventive services as well as general preventive health recommendations were provided to patient.  See attached scanned questionnaire for additional information.   Signed,   Lindell Noe, MHA, BS, LPN Health Coach

## 2016-09-05 NOTE — Progress Notes (Signed)
PCP notes:   Health maintenance:  Pt has been encouraged to access VA immunization records regarding tetanus, shingles, and pneumonia.   Pt wants to discuss Cologuard with PCP because he refused to do a colonoscopy when ordered by [facility].  Foot - necessity for exam by PCP at next appt Hep C- ordered/future labs Urine microalbumin - ordered/future labs Eye exam - per pt, completed at [facility] in Oct 2017  Abnormal screenings:   Fall risk - hx of multiple falls with injury  Patient concerns:   None  Nurse concerns:  None  Next PCP appt:   09/08/16 @ 1145

## 2016-09-06 NOTE — Progress Notes (Signed)
I reviewed health advisor's note, was available for consultation, and agree with documentation and plan.  

## 2016-09-08 ENCOUNTER — Ambulatory Visit (INDEPENDENT_AMBULATORY_CARE_PROVIDER_SITE_OTHER): Payer: Medicare Other | Admitting: Primary Care

## 2016-09-08 ENCOUNTER — Encounter: Payer: Self-pay | Admitting: Primary Care

## 2016-09-08 DIAGNOSIS — E119 Type 2 diabetes mellitus without complications: Secondary | ICD-10-CM | POA: Diagnosis not present

## 2016-09-08 DIAGNOSIS — I200 Unstable angina: Secondary | ICD-10-CM | POA: Diagnosis not present

## 2016-09-08 DIAGNOSIS — Z794 Long term (current) use of insulin: Secondary | ICD-10-CM

## 2016-09-08 DIAGNOSIS — D649 Anemia, unspecified: Secondary | ICD-10-CM | POA: Diagnosis not present

## 2016-09-08 DIAGNOSIS — Z125 Encounter for screening for malignant neoplasm of prostate: Secondary | ICD-10-CM | POA: Diagnosis not present

## 2016-09-08 DIAGNOSIS — I25119 Atherosclerotic heart disease of native coronary artery with unspecified angina pectoris: Secondary | ICD-10-CM | POA: Diagnosis not present

## 2016-09-08 DIAGNOSIS — E785 Hyperlipidemia, unspecified: Secondary | ICD-10-CM

## 2016-09-08 DIAGNOSIS — Z1159 Encounter for screening for other viral diseases: Secondary | ICD-10-CM | POA: Diagnosis not present

## 2016-09-08 DIAGNOSIS — R600 Localized edema: Secondary | ICD-10-CM

## 2016-09-08 DIAGNOSIS — I10 Essential (primary) hypertension: Secondary | ICD-10-CM

## 2016-09-08 LAB — CBC
HCT: 38.5 % — ABNORMAL LOW (ref 39.0–52.0)
HEMOGLOBIN: 12.7 g/dL — AB (ref 13.0–17.0)
MCHC: 33.1 g/dL (ref 30.0–36.0)
MCV: 89.4 fl (ref 78.0–100.0)
PLATELETS: 284 10*3/uL (ref 150.0–400.0)
RBC: 4.3 Mil/uL (ref 4.22–5.81)
RDW: 15.7 % — ABNORMAL HIGH (ref 11.5–15.5)
WBC: 7.8 10*3/uL (ref 4.0–10.5)

## 2016-09-08 LAB — LIPID PANEL
CHOLESTEROL: 164 mg/dL (ref 0–200)
HDL: 81 mg/dL (ref >39.00)
LDL Cholesterol: 69 mg/dL (ref 0–99)
NonHDL: 82.86
TRIGLYCERIDES: 69 mg/dL (ref 0.0–149.0)
Total CHOL/HDL Ratio: 2
VLDL: 13.8 mg/dL (ref 0.0–40.0)

## 2016-09-08 LAB — COMPREHENSIVE METABOLIC PANEL
ALT: 12 U/L (ref 0–53)
AST: 14 U/L (ref 0–37)
Albumin: 4.1 g/dL (ref 3.5–5.2)
Alkaline Phosphatase: 139 U/L — ABNORMAL HIGH (ref 39–117)
BUN: 22 mg/dL (ref 6–23)
CALCIUM: 9.6 mg/dL (ref 8.4–10.5)
CHLORIDE: 102 meq/L (ref 96–112)
CO2: 34 meq/L — AB (ref 19–32)
Creatinine, Ser: 0.96 mg/dL (ref 0.40–1.50)
GFR: 82.86 mL/min (ref >60.00)
Glucose, Bld: 54 mg/dL — ABNORMAL LOW (ref 70–99)
Potassium: 4.3 mEq/L (ref 3.5–5.1)
Sodium: 142 mEq/L (ref 135–145)
Total Bilirubin: 0.4 mg/dL (ref 0.2–1.2)
Total Protein: 7 g/dL (ref 6.0–8.3)

## 2016-09-08 LAB — MICROALBUMIN / CREATININE URINE RATIO
CREATININE, U: 40 mg/dL
Microalb Creat Ratio: 1.8 mg/g (ref 0.0–30.0)

## 2016-09-08 LAB — HEMOGLOBIN A1C: Hgb A1c MFr Bld: 7.9 % — ABNORMAL HIGH (ref 4.6–6.5)

## 2016-09-08 LAB — PSA, MEDICARE: PSA: 0.17 ng/ml (ref 0.10–4.00)

## 2016-09-08 NOTE — Assessment & Plan Note (Signed)
Much improved since purchasing medical compression stockings. Continue lasix, exercise, improvements in diet.

## 2016-09-08 NOTE — Patient Instructions (Signed)
I will notify you of your lab results once received.  You will be sent a specimen collecting kit for Cologuard. Follow the package instructions. I will notify you of your results once received.  It is important that you improve your diet. Please limit carbohydrates in the form of white bread, rice, pasta, sweets, fast food, fried food, sugary drinks, etc. Increase your consumption of fresh fruits and vegetables, whole grains, lean protein.  Ensure you are consuming 64 ounces of water daily.  Start exercising. You should be getting 150 minutes of moderate intensity exercise weekly. Work up to this slowly.  Please provide [place] with a copy of your immunization record from the [facility] if possible.   Follow up in 1 year for annual wellness visit or sooner if needed.  It was a pleasure to see you today!

## 2016-09-08 NOTE — Assessment & Plan Note (Signed)
Now managed on Crestor since CABG in summer 2017. Denies myalgias. Lipids and LFT's pending.

## 2016-09-08 NOTE — Progress Notes (Signed)
Pre visit review using our clinic review tool, if applicable. No additional management support is needed unless otherwise documented below in the visit note. 

## 2016-09-08 NOTE — Progress Notes (Signed)
Subjective:    Patient ID: Lawrence [name], male    DOB: May 31, 1951, 65 y.o.   MRN: [id number]  HPI  Lawrence [name] is a 65 year old male who presents today for follow up (Part 2) from [facility]. He saw our Health Advisor last week for his [place].   1) Type 2 Diabetes: Currently managed on Novolog 20 units TID and Lantus 20 units HS. He follows with the [facility]. His A1C is currently pending. He is due for follow up through the [facility] in December 2017 as he missed his appointment in September due to hospitalization. He's checking his sugars 4- 8 times daily. His fasting sugars are running between 120-250 on average with a few in the 300 range. He denies readings below 100. He will be seeing his podiatrist in November 2017 for complete foot exam.   2) Hyperlipidemia: Currently managed on Crestor 20 mg per Vascular Surgery. He follows with the [facility]. His lipid panel is pending today. He denies myalgias, prior history of myalgias with other statin treatment.   3) CAD: NSTEMI History. Currently following with Dr. Nehemiah Massed through [facility]. Currently managed on Lopressor, Lasix (lower extremity edema), and potassium chloride. Overall his lower extremity edema is well managed since he's switched to medical compression stockings.   4) MWV Follow Up: Completed MWV with Health Advisor last week. He is interested in trying Cologuard for Colon Cancer screening. He attempted colonoscopy through the [facility] 3-4 years ago and was sent back (test never completed) due to the presence of fecal matter in his colon. He is working to get his updated immunization records to our office. He believes he is UTD on all vaccinations through the [place].  Review of Systems  Constitutional: Negative for unexpected weight change.  Respiratory: Negative for shortness of breath.   Cardiovascular: Negative for chest pain.       Lower extremity edema improved.  Gastrointestinal: Negative for blood in stool.    Musculoskeletal: Negative for myalgias.  Skin: Negative for color change.  Neurological: Negative for dizziness and numbness.       Past Medical History:  Diagnosis Date  . CAD (coronary artery disease)    07/2007  . Diabetes ([place])   . Elevated lipids   . Glaucoma   . HBP (high blood pressure)   . Neuropathy ([place])   . Osteoarthritis   . Skin cancer   . Sleep apnea    CPAP     Social History   Social History  . Marital status: Married    Spouse name: N/A  . Number of children: N/A  . Years of education: N/A   Occupational History  . Not on file.   Social History Main Topics  . Smoking status: Former Smoker    Packs/day: 1.00    Years: 16.00    Types: Cigarettes    Start date: 11/03/1969    Quit date: 07/22/1986  . Smokeless tobacco: Never Used  . Alcohol use 0.0 oz/week     Comment: OCCASIONALLY  . Drug use: No  . Sexual activity: Not on file   Other Topics Concern  . Not on file   Social History Narrative   Married.  Lives at home with wife.     Past Surgical History:  Procedure Laterality Date  . APPENDECTOMY    . CARDIAC CATHETERIZATION N/A 06/11/2016   Procedure: Left Heart Cath and Coronary Angiography;  Surgeon: Corey Skains, MD;  Location: [facility];  Service: Cardiovascular;  Laterality: N/A;  . CARPAL TUNNEL RELEASE Left 04/24/2016   Procedure: CARPAL TUNNEL RELEASE;  Surgeon: Hessie Knows, MD;  Location: [facility] OR[facility];  Service: Orthopedics;  Laterality: Left;  . CATARACT EXTRACTION W/ INTRAOCULAR LENS  IMPLANT, BILATERAL Bilateral   . CATARACT EXTRACTION, BILATERAL    . CORONARY ARTERY BYPASS GRAFT N/A 06/27/2016   Procedure: CORONARY ARTERY BYPASS GRAFTING times four using left internal mammary artery and right leg saphenous vein;  Surgeon: Ivin Poot, MD;  Location: [place] [facility];  Service: Open Heart Surgery;  Laterality: N/A;  . EYE SURGERY Bilateral   . JOINT REPLACEMENT    . KNEE ARTHROPLASTY Left 02/11/2016   Procedure: COMPUTER  ASSISTED TOTAL KNEE ARTHROPLASTY;  Surgeon: Dereck Leep, MD;  Location: [facility] OR[facility];  Service: Orthopedics;  Laterality: Left;  . KNEE ARTHROSCOPY Right   . TEE WITHOUT CARDIOVERSION N/A 06/27/2016   Procedure: TRANSESOPHAGEAL ECHOCARDIOGRAM (TEE);  Surgeon: Ivin Poot, MD;  Location: [place];  Service: Open Heart Surgery;  Laterality: N/A;    Family History  Problem Relation Age of Onset  . Hypertension Mother   . Lupus Mother   . Hypertension Father   . Diabetes Father   . Heart attack Father 40    died in his 62s  . Arthritis Maternal Grandmother   . Arthritis Maternal Grandfather   . Arthritis Paternal Grandmother   . Arthritis Paternal Grandfather     Allergies  Allergen Reactions  . Nitroglycerin Nausea Only and Other (See Comments)    Patches only - headache  . Statins Other (See Comments)    BODY PAIN   . Lipitor [Atorvastatin] Other (See Comments) and Rash    BODY PAIN  . Lisinopril Other (See Comments)    Light headed, dizzy    Current Outpatient Prescriptions on File Prior to Visit  Medication Sig Dispense Refill  . aspirin 81 MG EC tablet Take 81 mg by mouth daily. Swallow whole.    . cholecalciferol (VITAMIN D) 1000 units tablet Take 1,000 Units by mouth daily.    [name] docusate sodium (COLACE) 100 MG capsule Take 200 mg by mouth 2 (two) times daily.    . furosemide (LASIX) 40 MG tablet Take 1 tablet (40 mg total) by mouth daily. 30 tablet 1  . insulin aspart (NOVOLOG) 100 UNIT/ML injection Inject 20 Units into the skin 3 (three) times daily. (Patient taking differently: Inject 20 Units into the skin 3 (three) times daily with meals. ) 10 mL 5  . insulin glargine (LANTUS) 100 UNIT/ML injection Inject 20 Units into the skin at bedtime.     [name] latanoprost (XALATAN) 0.005 % ophthalmic solution Place 1 drop into both eyes at bedtime.    [name] loratadine (CLARITIN) 10 MG tablet Take 10 mg by mouth daily.    . metoprolol tartrate (LOPRESSOR) 25 MG tablet Take 25 mg by mouth  2 (two) times daily.    . Multiple Vitamin (MULTIVITAMIN) capsule Take 1 capsule by mouth daily.    . potassium chloride SA (K-DUR,KLOR-CON) 20 MEQ tablet Take 1 tablet (20 mEq total) by mouth daily. 30 tablet 1  . rosuvastatin (CRESTOR) 20 MG tablet Take 1 tablet (20 mg total) by mouth daily at 6 PM. 30 tablet 1  . vitamin C (ASCORBIC ACID) 500 MG tablet Take 500 mg by mouth 2 (two) times daily.     [name] glucagon (GLUCAGON EMERGENCY) 1 MG injection Inject 1 mg into the vein once as needed.  Reported on 02/11/2016     No current facility-administered medications on file prior to visit.     BP (!) 142/68   Pulse 76   Temp 98 F (36.7 C) (Oral)   Ht 5\' 11"  (1.803 m)   Wt 294 lb 1.9 oz (133.4 kg)   SpO2 98%   BMI 41.02 kg/m    Objective:   Physical Exam  Constitutional: He appears well-nourished.  Neck: Neck supple.  Cardiovascular: Normal rate and regular rhythm.   Wearing compression stockings to lower extremities.  Pulmonary/Chest: Effort normal and breath sounds normal.  Skin: Skin is warm and dry.  incision site from CABG healed well.   Psychiatric: He has a normal mood and affect.          Assessment & Plan:  [place] Part 2:  Overall doing well. He will obtain immunization record from [facility] in December. Patient signed up for Cologuard today. Process explained. Discussed the importance of a healthy diet and regular exercise in order for weight loss, and to reduce the risk of other medical diseases.  Follow up in 1 year or sooner if needed.  Sheral Flow, NP

## 2016-09-08 NOTE — Assessment & Plan Note (Signed)
A1C and urine microalbumin pending. Mostly follows with [facility] and will see them in December 2017. Will complete foot exam with podiatrist next visit. Continue Novolog 20 units TID and Lantus 20 units HS. Managed on statin.

## 2016-09-08 NOTE — Assessment & Plan Note (Signed)
Stable in the office today, continue current regimen. 

## 2016-09-08 NOTE — Assessment & Plan Note (Signed)
Manages with Cardiology. Stable on statin and aspirin.

## 2016-09-09 LAB — HEPATITIS C ANTIBODY: HCV Ab: NEGATIVE

## 2016-09-11 ENCOUNTER — Encounter: Payer: Medicare Other | Attending: Cardiothoracic Surgery | Admitting: *Deleted

## 2016-09-11 VITALS — Ht 70.0 in | Wt 295.0 lb

## 2016-09-11 DIAGNOSIS — Z951 Presence of aortocoronary bypass graft: Secondary | ICD-10-CM

## 2016-09-11 NOTE — Progress Notes (Signed)
Cardiac Individual Treatment Plan  Patient Details  Name: Lawrence [name] MRN: [id number] Date of Birth: 11-10-1950 Referring Provider:   Flowsheet Row Cardiac Rehab from 09/11/2016 in [facility] Cardiac and Pulmonary Rehab  Referring Provider  Serafina Royals MD      Initial Encounter Date:  Flowsheet Row Cardiac Rehab from 09/11/2016 in [facility] Cardiac and Pulmonary Rehab  Date  09/11/16  Referring Provider  Serafina Royals MD      Visit Diagnosis: S/P CABG x 4  Patient's Home Medications on Admission:  Current Outpatient Prescriptions:  .  aspirin 81 MG EC tablet, Take 81 mg by mouth daily. Swallow whole., Disp: , Rfl:  .  cholecalciferol (VITAMIN D) 1000 units tablet, Take 1,000 Units by mouth daily., Disp: , Rfl:  .  docusate sodium (COLACE) 100 MG capsule, Take 200 mg by mouth 2 (two) times daily., Disp: , Rfl:  .  furosemide (LASIX) 40 MG tablet, Take 1 tablet (40 mg total) by mouth daily., Disp: 30 tablet, Rfl: 1 .  glucagon (GLUCAGON EMERGENCY) 1 MG injection, Inject 1 mg into the vein once as needed. Reported on 02/11/2016, Disp: , Rfl:  .  insulin aspart (NOVOLOG) 100 UNIT/ML injection, Inject 20 Units into the skin 3 (three) times daily. (Patient taking differently: Inject 20 Units into the skin 3 (three) times daily with meals. ), Disp: 10 mL, Rfl: 5 .  insulin glargine (LANTUS) 100 UNIT/ML injection, Inject 20 Units into the skin at bedtime. , Disp: , Rfl:  .  latanoprost (XALATAN) 0.005 % ophthalmic solution, Place 1 drop into both eyes at bedtime., Disp: , Rfl:  .  loratadine (CLARITIN) 10 MG tablet, Take 10 mg by mouth daily., Disp: , Rfl:  .  metoprolol tartrate (LOPRESSOR) 25 MG tablet, Take 25 mg by mouth 2 (two) times daily., Disp: , Rfl:  .  Multiple Vitamin (MULTIVITAMIN) capsule, Take 1 capsule by mouth daily., Disp: , Rfl:  .  potassium chloride SA (K-DUR,KLOR-CON) 20 MEQ tablet, Take 1 tablet (20 mEq total) by mouth daily., Disp: 30 tablet, Rfl: 1 .  rosuvastatin  (CRESTOR) 20 MG tablet, Take 1 tablet (20 mg total) by mouth daily at 6 PM., Disp: 30 tablet, Rfl: 1 .  vitamin C (ASCORBIC ACID) 500 MG tablet, Take 500 mg by mouth 2 (two) times daily. , Disp: , Rfl:   Past Medical History: Past Medical History:  Diagnosis Date  . CAD (coronary artery disease)    07/2007  . Diabetes ([place])   . Elevated lipids   . Glaucoma   . HBP (high blood pressure)   . Neuropathy ([place])   . Osteoarthritis   . Skin cancer   . Sleep apnea    CPAP    Tobacco Use: History  Smoking Status  . Former Smoker  . Packs/day: 1.00  . Years: 16.00  . Types: Cigarettes  . Start date: 11/03/1969  . Quit date: 07/22/1986  Smokeless Tobacco  . Never Used    Labs: Recent Review Flowsheet Data    Labs for ITP Cardiac and Pulmonary Rehab Latest Ref Rng & Units 06/27/2016 06/27/2016 06/27/2016 06/28/2016 09/08/2016   Cholestrol 0 - 200 mg/dL - - - - 164   LDLCALC 0 - 99 mg/dL - - - - 69   HDL >39.00 mg/dL - - - - 81.00   Trlycerides 0.0 - 149.0 mg/dL - - - - 69.0   Hemoglobin A1c 4.6 - 6.5 % - - - - 7.9(H)   PHART 7.350 -  7.450 7.372 - 7.390 - -   PCO2ART 35.0 - 45.0 mmHg 46.1(H) - 45.2(H) - -   HCO3 20.0 - 24.0 mEq/L 26.8(H) - 27.3(H) - -   TCO2 0 - 100 mmol/L 28 26 29 26  -   O2SAT % 97.0 - 96.0 - -       Exercise Target Goals: Date: 09/11/16  Exercise Program Goal: Individual exercise prescription set with THRR, safety & activity barriers. Participant demonstrates ability to understand and report RPE using BORG scale, to self-measure pulse accurately, and to acknowledge the importance of the exercise prescription.  Exercise Prescription Goal: Starting with aerobic activity 30 plus minutes a day, 3 days per week for initial exercise prescription. Provide home exercise prescription and guidelines that participant acknowledges understanding prior to discharge.  Activity Barriers & Risk Stratification:     Activity Barriers & Cardiac Risk Stratification - 09/11/16  1446      Activity Barriers & Cardiac Risk Stratification   Activity Barriers Left Knee Replacement;Arthritis;Muscular Weakness;Deconditioning;Assistive Device;History of Falls;Balance Concerns;Other (comment)   Comments neuropathy in both legs and feet   Cardiac Risk Stratification High      6 Minute Walk:     6 Minute Walk    Row Name 09/11/16 1455         6 Minute Walk   Phase Initial     Distance 1084 feet     Walk Time 6 minutes     # of Rest Breaks 0     MPH 2.05     METS 2.57     RPE 13     VO2 Peak 6.43     Symptoms No     Resting HR 72 bpm     Resting BP 126/64     Max Ex. HR 102 bpm     Max Ex. BP 134/84     2 Minute Post BP 126/62        Initial Exercise Prescription:     Initial Exercise Prescription - 09/11/16 1400      Date of Initial Exercise RX and Referring Provider   Date 09/11/16   Referring Provider Serafina Royals MD     Treadmill   MPH 1.5   Grade 0.5   Minutes 15   METs 2.25     NuStep   Level 1   Watts --  80-100 spm   Minutes 15   METs 2     T5 Nustep   Level 1   Watts --  80-100 spm   Minutes 15   METs 2     Prescription Details   Frequency (times per week) 3   Duration Progress to 45 minutes of aerobic exercise without signs/symptoms of physical distress     Intensity   THRR 40-80% of Max Heartrate 108-135   Ratings of Perceived Exertion 11-15   Perceived Dyspnea 0-4     Progression   Progression Continue to progress workloads to maintain intensity without signs/symptoms of physical distress.     Resistance Training   Training Prescription Yes   Weight 3 lbs   Reps 10-12      Perform Capillary Blood Glucose checks as needed.  Exercise Prescription Changes:     Exercise Prescription Changes    Row Name 09/11/16 1400             Exercise Review   Progression -  walk test results         Response to Exercise   Blood Pressure (  Admit) 126/64       Blood Pressure (Exercise) 134/84       Blood  Pressure (Exit) 126/62       Heart Rate (Admit) 76 bpm       Heart Rate (Exercise) 104 bpm       Heart Rate (Exit) 88 bpm       Rating of Perceived Exertion (Exercise) 13       Symptoms none          Exercise Comments:     Exercise Comments    Row Name 09/11/16 1458           Exercise Comments [name] wants to be able to play with his great granddaughter.          Discharge Exercise Prescription (Final Exercise Prescription Changes):     Exercise Prescription Changes - 09/11/16 1400      Exercise Review   Progression --  walk test results     Response to Exercise   Blood Pressure (Admit) 126/64   Blood Pressure (Exercise) 134/84   Blood Pressure (Exit) 126/62   Heart Rate (Admit) 76 bpm   Heart Rate (Exercise) 104 bpm   Heart Rate (Exit) 88 bpm   Rating of Perceived Exertion (Exercise) 13   Symptoms none      Nutrition:  Target Goals: Understanding of nutrition guidelines, daily intake of sodium 1500mg , cholesterol 200mg , calories 30% from fat and 7% or less from saturated fats, daily to have 5 or more servings of fruits and vegetables.  Biometrics:     Pre Biometrics - 09/11/16 1459      Pre Biometrics   Height 5\' 10"  (1.778 m)   Weight 295 lb (133.8 kg)   Waist Circumference 51.25 inches   Hip Circumference 55.5 inches   Waist to Hip Ratio 0.92 %   BMI (Calculated) 42.4   Single Leg Stand 1.5 seconds       Nutrition Therapy Plan and Nutrition Goals:     Nutrition Therapy & Goals - 09/11/16 1432      Intervention Plan   Intervention Prescribe, educate and counsel regarding individualized specific dietary modifications aiming towards targeted core components such as weight, hypertension, lipid management, diabetes, heart failure and other comorbidities.   Expected Outcomes Short Term Goal: Understand basic principles of dietary content, such as calories, fat, sodium, cholesterol and nutrients.;Short Term Goal: A plan has been developed with  personal nutrition goals set during dietitian appointment.;Long Term Goal: Adherence to prescribed nutrition plan.      Nutrition Discharge: Rate Your Plate Scores:   Nutrition Goals Re-Evaluation:   Psychosocial: Target Goals: Acknowledge presence or absence of depression, maximize coping skills, provide positive support system. Participant is able to verbalize types and ability to use techniques and skills needed for reducing stress and depression.  Initial Review & Psychosocial Screening:     Initial Psych Review & Screening - 09/11/16 1434      Initial Review   Current issues with Current Sleep Concerns  Since home from surgery, is improving some     Family Dynamics   Good Support System? Yes  Wife     Barriers   Psychosocial barriers to participate in program There are no identifiable barriers or psychosocial needs.;The patient should benefit from training in stress management and relaxation.     Screening Interventions   Interventions Encouraged to exercise      Quality of Life Scores:     Quality of Life - 09/11/16  1435      Quality of Life Scores   Health/Function Pre 25.25 %   Socioeconomic Pre 24.29 %   Psych/Spiritual Pre 28.29 %   Family Pre 30 %   GLOBAL Pre 26.41 %      PHQ-9: Recent Review Flowsheet Data    Depression screen [facility] 2/9 09/11/2016 09/05/2016   Decreased Interest 0 0   Down, Depressed, Hopeless 1 0   PHQ - 2 Score 1 0   Altered sleeping 1 -   Tired, decreased energy 1 -   Change in appetite 0 -   Feeling bad or failure about yourself  1 -   Trouble concentrating 1 -   Moving slowly or fidgety/restless 1 -   Suicidal thoughts 0 -   PHQ-9 Score 6 -   Difficult doing work/chores Somewhat difficult -      Psychosocial Evaluation and Intervention:   Psychosocial Re-Evaluation:   Vocational Rehabilitation: Provide vocational rehab assistance to qualifying candidates.   Vocational Rehab Evaluation & Intervention:      Vocational Rehab - 09/11/16 1448      Initial Vocational Rehab Evaluation & Intervention   Assessment shows need for Vocational Rehabilitation No      Education: Education Goals: Education classes will be provided on a weekly basis, covering required topics. Participant will state understanding/return demonstration of topics presented.  Learning Barriers/Preferences:     Learning Barriers/Preferences - 09/11/16 1447      Learning Barriers/Preferences   Learning Barriers Sight   Learning Preferences None      Education Topics: General Nutrition Guidelines/Fats and Fiber: -Group instruction provided by verbal, written material, models and posters to present the general guidelines for heart healthy nutrition. Gives an explanation and review of dietary fats and fiber.   Controlling Sodium/Reading Food Labels: -Group verbal and written material supporting the discussion of sodium use in heart healthy nutrition. Review and explanation with models, verbal and written materials for utilization of the food label.   Exercise Physiology & Risk Factors: - Group verbal and written instruction with models to review the exercise physiology of the cardiovascular system and associated critical values. Details cardiovascular disease risk factors and the goals associated with each risk factor.   Aerobic Exercise & Resistance Training: - Gives group verbal and written discussion on the health impact of inactivity. On the components of aerobic and resistive training programs and the benefits of this training and how to safely progress through these programs.   Flexibility, Balance, General Exercise Guidelines: - Provides group verbal and written instruction on the benefits of flexibility and balance training programs. Provides general exercise guidelines with specific guidelines to those with heart or lung disease. Demonstration and skill practice provided.   Stress Management: - Provides  group verbal and written instruction about the health risks of elevated stress, cause of high stress, and healthy ways to reduce stress.   Depression: - Provides group verbal and written instruction on the correlation between heart/lung disease and depressed mood, treatment options, and the stigmas associated with seeking treatment.   Anatomy & Physiology of the Heart: - Group verbal and written instruction and models provide basic cardiac anatomy and physiology, with the coronary electrical and arterial systems. Review of: AMI, Angina, Valve disease, Heart Failure, Cardiac Arrhythmia, Pacemakers, and the ICD.   Cardiac Procedures: - Group verbal and written instruction and models to describe the testing methods done to diagnose heart disease. Reviews the outcomes of the test results. Describes the treatment choices: Medical  Management, Angioplasty, or Coronary Bypass Surgery.   Cardiac Medications: - Group verbal and written instruction to review commonly prescribed medications for heart disease. Reviews the medication, class of the drug, and side effects. Includes the steps to properly store meds and maintain the prescription regimen.   Go Sex-Intimacy & Heart Disease, Get SMART - Goal Setting: - Group verbal and written instruction through game format to discuss heart disease and the return to sexual intimacy. Provides group verbal and written material to discuss and apply goal setting through the application of the S.M.A.R.T. Method.   Other Matters of the Heart: - Provides group verbal, written materials and models to describe Heart Failure, Angina, Valve Disease, and Diabetes in the realm of heart disease. Includes description of the disease process and treatment options available to the cardiac patient.   Exercise & Equipment Safety: - Individual verbal instruction and demonstration of equipment use and safety with use of the equipment. Flowsheet Row Cardiac Rehab from 09/11/2016  in [facility] Cardiac and Pulmonary Rehab  Date  09/11/16  Educator  SB  Instruction Review Code  2- meets goals/outcomes      Infection Prevention: - Provides verbal and written material to individual with discussion of infection control including proper hand washing and proper equipment cleaning during exercise session. Flowsheet Row Cardiac Rehab from 09/11/2016 in [facility] Cardiac and Pulmonary Rehab  Date  09/11/16  Educator  SB  Instruction Review Code  2- meets goals/outcomes      Falls Prevention: - Provides verbal and written material to individual with discussion of falls prevention and safety. Flowsheet Row Cardiac Rehab from 09/11/2016 in [facility] Cardiac and Pulmonary Rehab  Date  09/11/16  Educator  SB  Instruction Review Code  2- meets goals/outcomes      Diabetes: - Individual verbal and written instruction to review signs/symptoms of diabetes, desired ranges of glucose level fasting, after meals and with exercise. Advice that pre and post exercise glucose checks will be done for 3 sessions at entry of program. Flowsheet Row Cardiac Rehab from 09/11/2016 in [facility] Cardiac and Pulmonary Rehab  Date  09/11/16  Educator  SB  Instruction Review Code  2- meets goals/outcomes       Knowledge Questionnaire Score:     Knowledge Questionnaire Score - 09/11/16 1447      Knowledge Questionnaire Score   Pre Score 20/28      Core Components/Risk Factors/Patient Goals at Admission:     Personal Goals and Risk Factors at Admission - 09/11/16 1432      Core Components/Risk Factors/Patient Goals on Admission    Weight Management Obesity;Weight Maintenance;Yes   Admit Weight 295 lb (133.8 kg)   Goal Weight: Short Term 290 lb (131.5 kg)   Goal Weight: Long Term 220 lb (99.8 kg)   Expected Outcomes Short Term: Continue to assess and modify interventions until short term weight is achieved;Long Term: Adherence to nutrition and physical activity/exercise program aimed toward attainment  of established weight goal   Sedentary Yes   Intervention Provide advice, education, support and counseling about physical activity/exercise needs.;Develop an individualized exercise prescription for aerobic and resistive training based on initial evaluation findings, risk stratification, comorbidities and participant's personal goals.   Expected Outcomes Achievement of increased cardiorespiratory fitness and enhanced flexibility, muscular endurance and strength shown through measurements of functional capacity and personal statement of participant.   Increase Strength and Stamina Yes   Intervention Provide advice, education, support and counseling about physical activity/exercise needs.;Develop an individualized exercise prescription  for aerobic and resistive training based on initial evaluation findings, risk stratification, comorbidities and participant's personal goals.   Expected Outcomes Achievement of increased cardiorespiratory fitness and enhanced flexibility, muscular endurance and strength shown through measurements of functional capacity and personal statement of participant.   Diabetes Yes   Intervention Provide education about signs/symptoms and action to take for hypo/hyperglycemia.;Provide education about proper nutrition, including hydration, and aerobic/resistive exercise prescription along with prescribed medications to achieve blood glucose in normal ranges: Fasting glucose 65-99 mg/dL   Expected Outcomes Short Term: Participant verbalizes understanding of the signs/symptoms and immediate care of hyper/hypoglycemia, proper foot care and importance of medication, aerobic/resistive exercise and nutrition plan for blood glucose control.;Long Term: Attainment of HbA1C < 7%.   Hypertension Yes   Intervention Provide education on lifestyle modifcations including regular physical activity/exercise, weight management, moderate sodium restriction and increased consumption of fresh fruit,  vegetables, and low fat dairy, alcohol moderation, and smoking cessation.;Monitor prescription use compliance.   Expected Outcomes Short Term: Continued assessment and intervention until BP is < 140/79mm HG in hypertensive participants. < 130/8mm HG in hypertensive participants with diabetes, heart failure or chronic kidney disease.;Long Term: Maintenance of blood pressure at goal levels.   Lipids Yes  Statins cause muscle pain.   Crestor is new and so far doing well taking the Crestor   Intervention Provide education and support for participant on nutrition & aerobic/resistive exercise along with prescribed medications to achieve LDL 70mg , HDL >40mg .   Expected Outcomes Short Term: Participant states understanding of desired cholesterol values and is compliant with medications prescribed. Participant is following exercise prescription and nutrition guidelines.;Long Term: Cholesterol controlled with medications as prescribed, with individualized exercise RX and with personalized nutrition plan. Value goals: LDL < 70mg , HDL > 40 mg.      Core Components/Risk Factors/Patient Goals Review:    Core Components/Risk Factors/Patient Goals at Discharge (Final Review):    ITP Comments:     ITP Comments    Row Name 09/11/16 1430 09/11/16 1450         ITP Comments [employer] review completed today Initial ITP created. Documentation of Diagnosis can be found in [facility]  Surgery 06/27/2016 [employer] review completed today Initial ITP created. Documentation of Diagnosis can be found in [facility]  Surgery 06/24/2016         Comments: Initial ITP

## 2016-09-11 NOTE — Patient Instructions (Signed)
Patient Instructions  Patient Details  Name: Lawrence [name] MRN: [id number] Date of Birth: 1950/12/15 Referring Provider:  Ivin Poot, MD  Below are the personal goals you chose as well as exercise and nutrition goals. Our goal is to help you keep on track towards obtaining and maintaining your goals. We will be discussing your progress on these goals with you throughout the program.  Initial Exercise Prescription:     Initial Exercise Prescription - 09/11/16 1400      Date of Initial Exercise RX and Referring Provider   Date 09/11/16   Referring Provider Serafina Royals MD     Treadmill   MPH 1.5   Grade 0.5   Minutes 15   METs 2.25     NuStep   Level 1   Watts --  80-100 spm   Minutes 15   METs 2     T5 Nustep   Level 1   Watts --  80-100 spm   Minutes 15   METs 2     Prescription Details   Frequency (times per week) 3   Duration Progress to 45 minutes of aerobic exercise without signs/symptoms of physical distress     Intensity   THRR 40-80% of Max Heartrate 108-135   Ratings of Perceived Exertion 11-15   Perceived Dyspnea 0-4     Progression   Progression Continue to progress workloads to maintain intensity without signs/symptoms of physical distress.     Resistance Training   Training Prescription Yes   Weight 3 lbs   Reps 10-12      Exercise Goals: Frequency: Be able to perform aerobic exercise three times per week working toward 3-5 days per week.  Intensity: Work with a perceived exertion of 11 (fairly light) - 15 (hard) as tolerated. Follow your new exercise prescription and watch for changes in prescription as you progress with the program. Changes will be reviewed with you when they are made.  Duration: You should be able to do 30 minutes of continuous aerobic exercise in addition to a 5 minute warm-up and a 5 minute cool-down routine.  Nutrition Goals: Your personal nutrition goals will be established when you do your nutrition  analysis with the dietician.  The following are nutrition guidelines to follow: Cholesterol < 200mg /day Sodium < 1500mg /day Fiber: Men over 50 yrs - 30 grams per day  Personal Goals:     Personal Goals and Risk Factors at Admission - 09/11/16 1432      Core Components/Risk Factors/Patient Goals on Admission    Weight Management Obesity;Weight Maintenance;Yes   Admit Weight 295 lb (133.8 kg)   Goal Weight: Short Term 290 lb (131.5 kg)   Goal Weight: Long Term 220 lb (99.8 kg)   Expected Outcomes Short Term: Continue to assess and modify interventions until short term weight is achieved;Long Term: Adherence to nutrition and physical activity/exercise program aimed toward attainment of established weight goal   Sedentary Yes   Intervention Provide advice, education, support and counseling about physical activity/exercise needs.;Develop an individualized exercise prescription for aerobic and resistive training based on initial evaluation findings, risk stratification, comorbidities and participant's personal goals.   Expected Outcomes Achievement of increased cardiorespiratory fitness and enhanced flexibility, muscular endurance and strength shown through measurements of functional capacity and personal statement of participant.   Increase Strength and Stamina Yes   Intervention Provide advice, education, support and counseling about physical activity/exercise needs.;Develop an individualized exercise prescription for aerobic and resistive training based on initial evaluation  findings, risk stratification, comorbidities and participant's personal goals.   Expected Outcomes Achievement of increased cardiorespiratory fitness and enhanced flexibility, muscular endurance and strength shown through measurements of functional capacity and personal statement of participant.   Diabetes Yes   Intervention Provide education about signs/symptoms and action to take for hypo/hyperglycemia.;Provide education  about proper nutrition, including hydration, and aerobic/resistive exercise prescription along with prescribed medications to achieve blood glucose in normal ranges: Fasting glucose 65-99 mg/dL   Expected Outcomes Short Term: Participant verbalizes understanding of the signs/symptoms and immediate care of hyper/hypoglycemia, proper foot care and importance of medication, aerobic/resistive exercise and nutrition plan for blood glucose control.;Long Term: Attainment of HbA1C < 7%.   Hypertension Yes   Intervention Provide education on lifestyle modifcations including regular physical activity/exercise, weight management, moderate sodium restriction and increased consumption of fresh fruit, vegetables, and low fat dairy, alcohol moderation, and smoking cessation.;Monitor prescription use compliance.   Expected Outcomes Short Term: Continued assessment and intervention until BP is < 140/65mm HG in hypertensive participants. < 130/47mm HG in hypertensive participants with diabetes, heart failure or chronic kidney disease.;Long Term: Maintenance of blood pressure at goal levels.   Lipids Yes  Statins cause muscle pain.   Crestor is new and so far doing well taking the Crestor   Intervention Provide education and support for participant on nutrition & aerobic/resistive exercise along with prescribed medications to achieve LDL 70mg , HDL >40mg .   Expected Outcomes Short Term: Participant states understanding of desired cholesterol values and is compliant with medications prescribed. Participant is following exercise prescription and nutrition guidelines.;Long Term: Cholesterol controlled with medications as prescribed, with individualized exercise RX and with personalized nutrition plan. Value goals: LDL < 70mg , HDL > 40 mg.      Tobacco Use Initial Evaluation: History  Smoking Status  . Former Smoker  . Packs/day: 1.00  . Years: 16.00  . Types: Cigarettes  . Start date: 11/03/1969  . Quit date: 07/22/1986   Smokeless Tobacco  . Never Used    Copy of goals given to participant.

## 2016-09-15 DIAGNOSIS — I10 Essential (primary) hypertension: Secondary | ICD-10-CM | POA: Diagnosis not present

## 2016-09-15 DIAGNOSIS — I25810 Atherosclerosis of coronary artery bypass graft(s) without angina pectoris: Secondary | ICD-10-CM | POA: Diagnosis not present

## 2016-09-15 DIAGNOSIS — Z6841 Body Mass Index (BMI) 40.0 and over, adult: Secondary | ICD-10-CM | POA: Diagnosis not present

## 2016-09-15 DIAGNOSIS — E119 Type 2 diabetes mellitus without complications: Secondary | ICD-10-CM | POA: Diagnosis not present

## 2016-09-16 ENCOUNTER — Ambulatory Visit: Payer: Self-pay | Admitting: Podiatry

## 2016-09-16 ENCOUNTER — Ambulatory Visit (INDEPENDENT_AMBULATORY_CARE_PROVIDER_SITE_OTHER): Payer: Medicare Other | Admitting: Podiatry

## 2016-09-16 ENCOUNTER — Encounter: Payer: Self-pay | Admitting: Podiatry

## 2016-09-16 VITALS — BP 150/76 | HR 69

## 2016-09-16 DIAGNOSIS — L97509 Non-pressure chronic ulcer of other part of unspecified foot with unspecified severity: Secondary | ICD-10-CM

## 2016-09-16 DIAGNOSIS — E0843 Diabetes mellitus due to underlying condition with diabetic autonomic (poly)neuropathy: Secondary | ICD-10-CM

## 2016-09-16 DIAGNOSIS — E1143 Type 2 diabetes mellitus with diabetic autonomic (poly)neuropathy: Secondary | ICD-10-CM | POA: Diagnosis not present

## 2016-09-16 DIAGNOSIS — I70245 Atherosclerosis of native arteries of left leg with ulceration of other part of foot: Secondary | ICD-10-CM | POA: Diagnosis not present

## 2016-09-16 DIAGNOSIS — E08621 Diabetes mellitus due to underlying condition with foot ulcer: Secondary | ICD-10-CM

## 2016-09-16 MED ORDER — AMOXICILLIN-POT CLAVULANATE 875-125 MG PO TABS: 1.0000 | ORAL_TABLET | Freq: Two times a day (BID) | ORAL | 0 refills | Status: DC

## 2016-09-17 ENCOUNTER — Encounter: Payer: Medicare Other | Admitting: *Deleted

## 2016-09-17 DIAGNOSIS — Z951 Presence of aortocoronary bypass graft: Secondary | ICD-10-CM | POA: Diagnosis not present

## 2016-09-17 LAB — GLUCOSE, CAPILLARY: Glucose-Capillary: 298 mg/dL — ABNORMAL HIGH (ref 65–99)

## 2016-09-17 NOTE — Progress Notes (Signed)
Daily Session Note  Patient Details  Name: Lawrence [name] MRN: [id number] Date of Birth: July 18, 1951 Referring Provider:   Flowsheet Row Cardiac Rehab from 09/11/2016 in [facility] Cardiac and Pulmonary Rehab  Referring Provider  Serafina Royals MD      Encounter Date: 09/17/2016  Check In:     Session Check In - 09/17/16 1647      Check-In   Location [facility]-Cardiac & Pulmonary Rehab   Staff Present Gerlene Burdock, RN, Vickki Hearing, BA, ACSM CEP, Exercise Physiologist;Other  Raford Pitcher R.N.    Supervising physician immediately available to respond to emergencies See telemetry face sheet for immediately available ER MD   Medication changes reported     No   Fall or balance concerns reported    No   Warm-up and Cool-down Performed on first and last piece of equipment   Resistance Training Performed Yes   VAD Patient? No     Pain Assessment   Currently in Pain? No/denies         Goals Met:  Proper associated with RPD/PD & O2 Sat Exercise tolerated well  Goals Unmet:  Not Applicable  Comments:     Dr. Emily Filbert is Medical Director for [place] and [facility].

## 2016-09-18 DIAGNOSIS — Z951 Presence of aortocoronary bypass graft: Secondary | ICD-10-CM | POA: Diagnosis not present

## 2016-09-18 LAB — GLUCOSE, CAPILLARY
Glucose-Capillary: 120 mg/dL — ABNORMAL HIGH (ref 65–99)
Glucose-Capillary: 117 mg/dL — ABNORMAL HIGH (ref 65–99)
Glucose-Capillary: 135 mg/dL — ABNORMAL HIGH (ref 65–99)

## 2016-09-18 NOTE — Progress Notes (Signed)
Daily Session Note  Patient Details  Name: Lawrence [name] MRN: [id number] Date of Birth: January 26, 1951 Referring Provider:   Flowsheet Row Cardiac Rehab from 09/11/2016 in [facility] Cardiac and Pulmonary Rehab  Referring Provider  Serafina Royals MD      Encounter Date: 09/18/2016  Check In:     Session Check In - 09/18/16 1720      Check-In   Location [facility]-Cardiac & Pulmonary Rehab   Staff Present Nyoka Cowden, RN, BSN, Bonnita Hollow, BS, ACSM CEP, Exercise Physiologist;Amanda Oletta Darter, [place], ACSM CEP, Exercise Physiologist   Supervising physician immediately available to respond to emergencies See telemetry face sheet for immediately available ER MD   Medication changes reported     No   Fall or balance concerns reported    No   Warm-up and Cool-down Performed on first and last piece of equipment   Resistance Training Performed Yes   VAD Patient? No     Pain Assessment   Currently in Pain? No/denies   Multiple Pain Sites No           Exercise Prescription Changes - 09/18/16 1100      Response to Exercise   Blood Pressure (Admit) 146/42   Blood Pressure (Exercise) 158/80   Blood Pressure (Exit) 132/80   Heart Rate (Exercise) 96 bpm   Heart Rate (Exit) 80 bpm   Rating of Perceived Exertion (Exercise) 13   Symptoms none     Progression   Average METs 1.8     Resistance Training   Training Prescription Yes   Weight 3   Reps 10-12     Interval Training   Interval Training No     NuStep   Level 1   Minutes 15   METs 1.7     T5 Nustep   Level 1   Minutes 15   METs 1.9      Goals Met:  Independence with exercise equipment Exercise tolerated well No report of cardiac concerns or symptoms Strength training completed today  Goals Unmet:  Not Applicable  Comments: Pt able to follow exercise prescription today without complaint.  Will continue to monitor for progression.    Dr. Emily Filbert is Medical Director for [place] and  [facility].

## 2016-09-22 ENCOUNTER — Encounter: Payer: Medicare Other | Admitting: *Deleted

## 2016-09-22 DIAGNOSIS — Z951 Presence of aortocoronary bypass graft: Secondary | ICD-10-CM | POA: Diagnosis not present

## 2016-09-22 NOTE — Progress Notes (Signed)
Daily Session Note  Patient Details  Name: Lawrence [name] MRN: [id number] Date of Birth: [birth year] Referring Provider:   Flowsheet Row Cardiac Rehab from 09/11/2016 in [facility] Cardiac and Pulmonary Rehab  Referring Provider  Serafina Royals MD      Encounter Date: 09/22/2016  Check In:     Session Check In - 09/22/16 1626      Check-In   Staff Present Heath Lark, RN, BSN, Laveda Norman, BS, ACSM CEP, Exercise Physiologist;Other  Levell July RN    Supervising physician immediately available to respond to emergencies See telemetry face sheet for immediately available ER MD   Medication changes reported     No   Fall or balance concerns reported    No   Warm-up and Cool-down Performed on first and last piece of equipment   Resistance Training Performed Yes   VAD Patient? No     Pain Assessment   Currently in Pain? No/denies         Goals Met:  Exercise tolerated well No report of cardiac concerns or symptoms Strength training completed today  Goals Unmet:  Not Applicable  Comments: Doing well with exercise prescription progression.    Dr. Emily Filbert is Medical Director for [place] and [facility].

## 2016-09-24 ENCOUNTER — Encounter: Payer: Medicare Other | Admitting: *Deleted

## 2016-09-24 DIAGNOSIS — Z951 Presence of aortocoronary bypass graft: Secondary | ICD-10-CM | POA: Diagnosis not present

## 2016-09-24 LAB — GLUCOSE, CAPILLARY
Glucose-Capillary: 57 mg/dL — ABNORMAL LOW (ref 65–99)
Glucose-Capillary: 66 mg/dL (ref 65–99)
Glucose-Capillary: 108 mg/dL — ABNORMAL HIGH (ref 65–99)
Glucose-Capillary: 73 mg/dL (ref 65–99)
Glucose-Capillary: 85 mg/dL (ref 65–99)

## 2016-09-24 NOTE — Progress Notes (Signed)
Daily Session Note  Patient Details  Name: Lawrence [name] MRN: [id number] Date of Birth: May 28, 1951 Referring Provider:   Flowsheet Row Cardiac Rehab from 09/11/2016 in [facility] Cardiac and Pulmonary Rehab  Referring Provider  Serafina Royals MD      Encounter Date: 09/24/2016  Check In:     Session Check In - 09/24/16 1628      Check-In   Location [facility]-Cardiac & Pulmonary Rehab   Staff Present Gerlene Burdock, RN, Vickki Hearing, BA, ACSM CEP, Exercise Physiologist  Jena Gauss, RN   Supervising physician immediately available to respond to emergencies See telemetry face sheet for immediately available ER MD   Medication changes reported     No   Fall or balance concerns reported    No   Warm-up and Cool-down Performed on first and last piece of equipment   Resistance Training Performed Yes   VAD Patient? No     Pain Assessment   Currently in Pain? No/denies         Goals Met:  Proper associated with RPD/PD & O2 Sat Exercise tolerated well  Goals Unmet:  Not Applicable  Comments:     Dr. Emily Filbert is Medical Director for [place] and [facility].

## 2016-09-24 NOTE — Progress Notes (Signed)
Incomplete Session Note  Patient Details  Name: Lawrence [name] MRN: [id number] Date of Birth: [birth year] Referring Provider:   Flowsheet Row Cardiac Rehab from 09/11/2016 in [facility] Cardiac and Pulmonary Rehab  Referring Provider  Serafina Royals MD      Lawrence [name] did not complete his rehab session today Lawrence [name]" said he has seen so many dieticians over the years and his blood sugars have been way up and way down some days. He sees an Endocrine MD at the [place].  Marland KitchenSee lab info for fingerstick blood sugars upon arrival was 57at 4pm. Finally at 4:50pm blood sugar was 85 after multiple sugar glucose tablets.

## 2016-09-28 NOTE — Progress Notes (Signed)
Subjective:  Patient with a history of diabetes mellitus presents today for evaluation ulceration(s) to the lower extremitie(s). Patient states that he did have a history of diabetes mellitus. Patient presents today for further treatment and evaluation   Objective/Physical Exam General: The patient is alert and oriented x3 in no acute distress.  Dermatology:  Wound #1 noted to the distal tuft of the second digit left foot measuring [phone] cm (LxWxD).   To the noted ulceration(s), there is no eschar. There is a moderate amount of slough, fibrin, and necrotic tissue noted. Granulation tissue and wound base is red. There is a minimal amount of serosanguineous drainage noted. There is no exposed bone muscle-tendon ligament or joint. There is no malodor. Periwound integrity is intact. Skin is warm, dry and supple bilateral lower extremities.  Vascular: Palpable pedal pulses bilaterally. No edema or erythema noted. Capillary refill within normal limits.  Neurological: Epicritic and protective threshold absent bilaterally.   Musculoskeletal Exam: Range of motion within normal limits to all pedal and ankle joints bilateral. Muscle strength 5/5 in all groups bilateral.   Assessment: #1 ulcer second digit left foot secondary to diabetes mellitus #2 diabetes mellitus w/ peripheral neuropathy   Plan of Care:  #1 Patient was evaluated. #2 medically necessary excisional debridement including subcutaneous tissue was performed using a tissue nipper and a chisel blade. Excisional debridement of all the necrotic nonviable tissue down to healthy bleeding viable tissue was performed with post-debridement measurements same as pre-. #3 the wound was cleansed and dry sterile dressing applied. #4 prescription for Augmentin 10 days  #5 silicone toe cap dispensed #6 patient is to return to clinic in 2 weeks.   Dr. [name], [place]

## 2016-09-29 ENCOUNTER — Encounter: Payer: Self-pay | Admitting: Primary Care

## 2016-09-29 ENCOUNTER — Encounter: Payer: Self-pay | Admitting: Internal Medicine

## 2016-09-29 ENCOUNTER — Encounter: Payer: Medicare Other | Admitting: *Deleted

## 2016-09-29 DIAGNOSIS — Z951 Presence of aortocoronary bypass graft: Secondary | ICD-10-CM

## 2016-09-29 DIAGNOSIS — Z1212 Encounter for screening for malignant neoplasm of rectum: Secondary | ICD-10-CM | POA: Diagnosis not present

## 2016-09-29 DIAGNOSIS — Z1211 Encounter for screening for malignant neoplasm of colon: Secondary | ICD-10-CM | POA: Diagnosis not present

## 2016-09-29 LAB — GLUCOSE, CAPILLARY
Glucose-Capillary: 94 mg/dL (ref 65–99)
Glucose-Capillary: 139 mg/dL — ABNORMAL HIGH (ref 65–99)
Glucose-Capillary: 139 mg/dL — ABNORMAL HIGH (ref 65–99)

## 2016-09-29 LAB — COLOGUARD

## 2016-09-29 NOTE — Progress Notes (Signed)
Daily Session Note  Patient Details  Name: Lawrence [name] MRN: [id number] Date of Birth: [birth year] Referring Provider:   Flowsheet Row Cardiac Rehab from 09/11/2016 in [facility] Cardiac and Pulmonary Rehab  Referring Provider  Serafina Royals MD      Encounter Date: 09/29/2016  Check In:     Session Check In - 09/29/16 1622      Check-In   Location [facility]-Cardiac & Pulmonary Rehab   Staff Present Heath Lark, RN, BSN, CCRP;Dellie Piasecki, RN, Moises Blood, BS, ACSM CEP, Exercise Physiologist   Supervising physician immediately available to respond to emergencies See telemetry face sheet for immediately available ER MD   Medication changes reported     No   Fall or balance concerns reported    No   Warm-up and Cool-down Performed on first and last piece of equipment   Resistance Training Performed Yes   VAD Patient? No     Pain Assessment   Currently in Pain? No/denies         Goals Met:  Proper associated with RPD/PD & O2 Sat Exercise tolerated well No report of cardiac concerns or symptoms  Goals Unmet:  Not Applicable  Comments:     Dr. Emily Filbert is Medical Director for [place] and [facility].

## 2016-10-01 ENCOUNTER — Encounter: Payer: Medicare Other | Admitting: *Deleted

## 2016-10-01 DIAGNOSIS — Z951 Presence of aortocoronary bypass graft: Secondary | ICD-10-CM | POA: Diagnosis not present

## 2016-10-01 NOTE — Progress Notes (Signed)
Daily Session Note  Patient Details  Name: Lawrence [name] MRN: [id number] Date of Birth: [birth year] Referring Provider:   Flowsheet Row Cardiac Rehab from 09/11/2016 in [facility] Cardiac and Pulmonary Rehab  Referring Provider  Serafina Royals MD      Encounter Date: 10/01/2016  Check In:     Session Check In - 10/01/16 1632      Check-In   Location [facility]-Cardiac & Pulmonary Rehab   Staff Present Heath Lark, RN, BSN, CCRP;Amanda Sommer, BA, ACSM CEP, Exercise Physiologist  Levell July RN BSN   Supervising physician immediately available to respond to emergencies See telemetry face sheet for immediately available ER MD   Medication changes reported     No   Fall or balance concerns reported    No   Warm-up and Cool-down Performed on first and last piece of equipment   Resistance Training Performed Yes   VAD Patient? No     Pain Assessment   Currently in Pain? No/denies           Exercise Prescription Changes - 10/01/16 1300      Exercise Review   Progression Yes     Response to Exercise   Blood Pressure (Admit) 142/70   Blood Pressure (Exercise) 148/72   Blood Pressure (Exit) 148/60   Heart Rate (Admit) 81 bpm   Heart Rate (Exercise) 113 bpm   Heart Rate (Exit) 87 bpm   Rating of Perceived Exertion (Exercise) 12   Duration Progress to 45 minutes of aerobic exercise without signs/symptoms of physical distress   Intensity THRR unchanged     Progression   Progression Continue to progress workloads to maintain intensity without signs/symptoms of physical distress.   Average METs 2.2     Resistance Training   Training Prescription Yes   Weight 3   Reps 10-12     Interval Training   Interval Training No     Treadmill   MPH 1.5   Grade 0.5   Minutes 15   METs 2.25     NuStep   Level 1   Minutes 15   METs 2.1      Goals Met:  Exercise tolerated well No report of cardiac concerns or symptoms Strength training completed today  Goals Unmet:  Not  Applicable  Comments: Doing well with exercise prescription progression.    Dr. Emily Filbert is Medical Director for [place] and [facility].

## 2016-10-02 DIAGNOSIS — Z951 Presence of aortocoronary bypass graft: Secondary | ICD-10-CM | POA: Diagnosis not present

## 2016-10-02 LAB — GLUCOSE, CAPILLARY: Glucose-Capillary: 106 mg/dL — ABNORMAL HIGH (ref 65–99)

## 2016-10-02 NOTE — Progress Notes (Signed)
Daily Session Note  Patient Details  Name: Lawrence [name] MRN: [id number] Date of Birth: Jul 25, 1951 Referring Provider:   Flowsheet Row Cardiac Rehab from 09/11/2016 in [facility] Cardiac and Pulmonary Rehab  Referring Provider  Serafina Royals MD      Encounter Date: 10/02/2016  Check In:     Session Check In - 10/02/16 1755      Check-In   Location [facility]-Cardiac & Pulmonary Rehab   Staff Present Earlean Shawl, BS, ACSM CEP, Exercise Physiologist;Amanda Oletta Darter, BA, ACSM CEP, Exercise Physiologist;Other   Supervising physician immediately available to respond to emergencies See telemetry face sheet for immediately available ER MD   Medication changes reported     No   Fall or balance concerns reported    No   Warm-up and Cool-down Performed on first and last piece of equipment   Resistance Training Performed Yes   VAD Patient? No     Pain Assessment   Currently in Pain? No/denies   Multiple Pain Sites No         Goals Met:  Independence with exercise equipment Exercise tolerated well No report of cardiac concerns or symptoms Strength training completed today  Goals Unmet:  Not Applicable  Comments: [name] felt symptomatic for low BG and checked it himself without informing staff.  He was eating a candy bar and it was advised to stop by staff.  He was given glucose tabs, BG was 106 upon recheck and he was given crackers before leaving.   Dr. Emily Filbert is Medical Director for [place] and [facility].

## 2016-10-03 ENCOUNTER — Encounter: Payer: Self-pay | Admitting: Podiatry

## 2016-10-03 ENCOUNTER — Ambulatory Visit (INDEPENDENT_AMBULATORY_CARE_PROVIDER_SITE_OTHER): Payer: Medicare Other | Admitting: Podiatry

## 2016-10-03 DIAGNOSIS — I70245 Atherosclerosis of native arteries of left leg with ulceration of other part of foot: Secondary | ICD-10-CM | POA: Diagnosis not present

## 2016-10-03 DIAGNOSIS — I200 Unstable angina: Secondary | ICD-10-CM | POA: Diagnosis not present

## 2016-10-03 DIAGNOSIS — E08621 Diabetes mellitus due to underlying condition with foot ulcer: Secondary | ICD-10-CM | POA: Diagnosis not present

## 2016-10-03 DIAGNOSIS — E1143 Type 2 diabetes mellitus with diabetic autonomic (poly)neuropathy: Secondary | ICD-10-CM | POA: Diagnosis not present

## 2016-10-03 DIAGNOSIS — E0843 Diabetes mellitus due to underlying condition with diabetic autonomic (poly)neuropathy: Secondary | ICD-10-CM

## 2016-10-03 DIAGNOSIS — L600 Ingrowing nail: Secondary | ICD-10-CM

## 2016-10-03 DIAGNOSIS — M79676 Pain in unspecified toe(s): Secondary | ICD-10-CM | POA: Diagnosis not present

## 2016-10-03 DIAGNOSIS — L97509 Non-pressure chronic ulcer of other part of unspecified foot with unspecified severity: Secondary | ICD-10-CM | POA: Diagnosis not present

## 2016-10-03 DIAGNOSIS — L03039 Cellulitis of unspecified toe: Secondary | ICD-10-CM

## 2016-10-03 DIAGNOSIS — L97522 Non-pressure chronic ulcer of other part of left foot with fat layer exposed: Secondary | ICD-10-CM | POA: Diagnosis not present

## 2016-10-05 NOTE — Progress Notes (Signed)
Subjective:  Patient with a history of diabetes mellitus presents today for evaluation ulceration(s) to the lower extremitie(s). Patient states that he did have a history of diabetes mellitus. Patient also complains with a new complaint of pain and sensitivity to the medial aspect of the right great toe. Patient states that the pain is been present for several weeks. Patient presents today for further treatment and evaluation   Objective/Physical Exam General: The patient is alert and oriented x3 in no acute distress.  Dermatology:  Wound #1 noted to the distal tuft of the second digit left foot measuring [phone] cm (LxWxD).   To the noted ulceration(s), there is no eschar. There is a moderate amount of slough, fibrin, and necrotic tissue noted. Granulation tissue and wound base is red. There is a minimal amount of serosanguineous drainage noted. There is no exposed bone muscle-tendon ligament or joint. There is no malodor. Periwound integrity is intact.  Severely incurvated toenail noted to the medial aspect of the right great toe with an inflammatory reaction on the periungual border presenting with erythema. Skin is warm, dry and supple bilateral lower extremities.  Vascular: Palpable pedal pulses bilaterally. No edema or erythema noted. Capillary refill within normal limits.  Neurological: Epicritic and protective threshold absent bilaterally.   Musculoskeletal Exam: Pain on palpation noted to the medial aspect of the right great toenail along the periungual border suggestive of ingrowing nail. Range of motion within normal limits to all pedal and ankle joints bilateral. Muscle strength 5/5 in all groups bilateral.   Assessment: #1 ulceration second digit left foot secondary to diabetes mellitus #2 diabetes mellitus w/ peripheral neuropathy #3 ingrowing nail right great toe medial border #4 paronychia right great toe medial border #5 pain in right great toe   Plan of Care:  #1  Patient was evaluated. #2 medically necessary excisional debridement including subcutaneous tissue was performed using a tissue nipper and a chisel blade. Excisional debridement of all the necrotic nonviable tissue down to healthy bleeding viable tissue was performed with post-debridement measurements same as pre-. #3 the wound was cleansed and dry sterile dressing applied. #4 partial permanent nail avulsion was performed to the medial aspect of the right great toenail. Prior to procedure the toe was blocked using a 50-50 mixture of 2% lidocaine plain with 0.5% Marcaine plain in a hallux block fashion. The great toe was prepped and iodine aseptic technique was utilized. Partial permanent nail avulsion was performed with application of phenol [id number] seconds. #5 patient is to return to clinic in 2 weeks.   Dr. [name], [place]

## 2016-10-06 ENCOUNTER — Encounter: Payer: Medicare Other | Attending: Cardiothoracic Surgery

## 2016-10-06 DIAGNOSIS — Z951 Presence of aortocoronary bypass graft: Secondary | ICD-10-CM | POA: Diagnosis not present

## 2016-10-06 NOTE — Progress Notes (Signed)
Daily Session Note  Patient Details  Name: Lawrence [name] MRN: [id number] Date of Birth: [birth year] Referring Provider:   Flowsheet Row Cardiac Rehab from 09/11/2016 in [facility] Cardiac and Pulmonary Rehab  Referring Provider  Serafina Royals MD      Encounter Date: 10/06/2016  Check In:     Session Check In - 10/06/16 1633      Check-In   Location [facility]-Cardiac & Pulmonary Rehab   Staff Present Heath Lark, RN, BSN, Laveda Norman, BS, ACSM CEP, Exercise Physiologist;Linley Moskal Brayton El, DPT, CEEA   Supervising physician immediately available to respond to emergencies See telemetry face sheet for immediately available ER MD   Medication changes reported     No   Fall or balance concerns reported    No   Warm-up and Cool-down Performed on first and last piece of equipment   Resistance Training Performed Yes   VAD Patient? No     Pain Assessment   Currently in Pain? No/denies         Goals Met:  Independence with exercise equipment  Goals Unmet:  Not Applicable  Comments:  Patient completed exercise prescription and all exercise goals during rehab session. The exercise was tolerated well and the patient is progressing in the program.    Dr. Emily Filbert is Medical Director for [place] and [facility].

## 2016-10-07 LAB — COLOGUARD: Cologuard: NEGATIVE

## 2016-10-08 ENCOUNTER — Encounter: Payer: Self-pay | Admitting: Primary Care

## 2016-10-08 ENCOUNTER — Encounter: Payer: Medicare Other | Admitting: *Deleted

## 2016-10-08 ENCOUNTER — Encounter: Payer: Self-pay | Admitting: *Deleted

## 2016-10-08 ENCOUNTER — Ambulatory Visit (INDEPENDENT_AMBULATORY_CARE_PROVIDER_SITE_OTHER): Payer: Medicare Other | Admitting: Primary Care

## 2016-10-08 VITALS — BP 136/66 | HR 72 | Temp 98.0°F | Ht 71.0 in | Wt 296.8 lb

## 2016-10-08 DIAGNOSIS — J069 Acute upper respiratory infection, unspecified: Secondary | ICD-10-CM

## 2016-10-08 DIAGNOSIS — Z951 Presence of aortocoronary bypass graft: Secondary | ICD-10-CM | POA: Diagnosis not present

## 2016-10-08 DIAGNOSIS — I200 Unstable angina: Secondary | ICD-10-CM

## 2016-10-08 DIAGNOSIS — B9789 Other viral agents as the cause of diseases classified elsewhere: Secondary | ICD-10-CM

## 2016-10-08 DIAGNOSIS — E109 Type 1 diabetes mellitus without complications: Secondary | ICD-10-CM | POA: Insufficient documentation

## 2016-10-08 LAB — GLUCOSE, CAPILLARY: Glucose-Capillary: 313 mg/dL — ABNORMAL HIGH (ref 65–99)

## 2016-10-08 NOTE — Progress Notes (Signed)
Cardiac Individual Treatment Plan  Patient Details  Name: Lawrence [name] MRN: [id number] Date of Birth: [birth year] Referring Provider:   Flowsheet Row Cardiac Rehab from 09/11/2016 in [facility] Cardiac and Pulmonary Rehab  Referring Provider  Serafina Royals MD      Initial Encounter Date:  Flowsheet Row Cardiac Rehab from 09/11/2016 in [facility] Cardiac and Pulmonary Rehab  Date  09/11/16  Referring Provider  Serafina Royals MD      Visit Diagnosis: S/P CABG x 4  Patient's Home Medications on Admission:  Current Outpatient Prescriptions:  .  amoxicillin-clavulanate (AUGMENTIN) 875-125 MG tablet, Take 1 tablet by mouth 2 (two) times daily., Disp: 20 tablet, Rfl: 0 .  aspirin 81 MG EC tablet, Take 81 mg by mouth daily. Swallow whole., Disp: , Rfl:  .  cholecalciferol (VITAMIN D) 1000 units tablet, Take 1,000 Units by mouth daily., Disp: , Rfl:  .  docusate sodium (COLACE) 100 MG capsule, Take 200 mg by mouth 2 (two) times daily., Disp: , Rfl:  .  furosemide (LASIX) 40 MG tablet, Take 1 tablet (40 mg total) by mouth daily., Disp: 30 tablet, Rfl: 1 .  glucagon (GLUCAGON EMERGENCY) 1 MG injection, Inject 1 mg into the vein once as needed. Reported on 02/11/2016, Disp: , Rfl:  .  insulin aspart (NOVOLOG) 100 UNIT/ML injection, Inject 20 Units into the skin 3 (three) times daily. (Patient taking differently: Inject 20 Units into the skin 3 (three) times daily with meals. ), Disp: 10 mL, Rfl: 5 .  insulin glargine (LANTUS) 100 UNIT/ML injection, Inject 20 Units into the skin at bedtime. , Disp: , Rfl:  .  latanoprost (XALATAN) 0.005 % ophthalmic solution, Place 1 drop into both eyes at bedtime., Disp: , Rfl:  .  loratadine (CLARITIN) 10 MG tablet, Take 10 mg by mouth daily., Disp: , Rfl:  .  metoprolol tartrate (LOPRESSOR) 25 MG tablet, Take 25 mg by mouth 2 (two) times daily., Disp: , Rfl:  .  Multiple Vitamin (MULTIVITAMIN) capsule, Take 1 capsule by mouth daily., Disp: , Rfl:  .  potassium chloride  SA (K-DUR,KLOR-CON) 20 MEQ tablet, Take 1 tablet (20 mEq total) by mouth daily., Disp: 30 tablet, Rfl: 1 .  rosuvastatin (CRESTOR) 20 MG tablet, Take 1 tablet (20 mg total) by mouth daily at 6 PM., Disp: 30 tablet, Rfl: 1 .  vitamin C (ASCORBIC ACID) 500 MG tablet, Take 500 mg by mouth 2 (two) times daily. , Disp: , Rfl:   Past Medical History: Past Medical History:  Diagnosis Date  . CAD (coronary artery disease)    07/2007  . Diabetes ([place])   . Elevated lipids   . Glaucoma   . HBP (high blood pressure)   . Neuropathy ([place])   . Osteoarthritis   . Skin cancer   . Sleep apnea    CPAP    Tobacco Use: History  Smoking Status  . Former Smoker  . Packs/day: 1.00  . Years: 16.00  . Types: Cigarettes  . Start date: 11/03/1969  . Quit date: 07/22/1986  Smokeless Tobacco  . Never Used    Labs: Recent Review Flowsheet Data    Labs for ITP Cardiac and Pulmonary Rehab Latest Ref Rng & Units 06/27/2016 06/27/2016 06/27/2016 06/28/2016 09/08/2016   Cholestrol 0 - 200 mg/dL - - - - 164   LDLCALC 0 - 99 mg/dL - - - - 69   HDL >39.00 mg/dL - - - - 81.00   Trlycerides 0.0 - 149.0 mg/dL - - - -  69.0   Hemoglobin A1c 4.6 - 6.5 % - - - - 7.9(H)   PHART 7.350 - 7.450 7.372 - 7.390 - -   PCO2ART 35.0 - 45.0 mmHg 46.1(H) - 45.2(H) - -   HCO3 20.0 - 24.0 mEq/L 26.8(H) - 27.3(H) - -   TCO2 0 - 100 mmol/L _0 -   O2SAT % 97.0 - 96.0 - -       Exercise Target Goals:    Exercise Program Goal: Individual exercise prescription set with THRR, safety & activity barriers. Participant demonstrates ability to understand and report RPE using BORG scale, to self-measure pulse accurately, and to acknowledge the importance of the exercise prescription.  Exercise Prescription Goal: Starting with aerobic activity 30 plus minutes [name] day, 3 days per week for initial exercise prescription. Provide home exercise prescription and guidelines that participant acknowledges understanding prior to  discharge.  Activity Barriers & Risk Stratification:     Activity Barriers & Cardiac Risk Stratification - 09/11/16 1446      Activity Barriers & Cardiac Risk Stratification   Activity Barriers Left Knee Replacement;Arthritis;Muscular Weakness;Deconditioning;Assistive Device;History of Falls;Balance Concerns;Other (comment)   Comments neuropathy in both legs and feet   Cardiac Risk Stratification High      6 Minute Walk:     6 Minute Walk    Row Name 09/11/16 1455         6 Minute Walk   Phase Initial     Distance 1084 feet     Walk Time 6 minutes     # of Rest Breaks 0     MPH 2.05     METS 2.57     RPE 13     VO2 Peak 6.43     Symptoms No     Resting HR 72 bpm     Resting BP 126/64     Max Ex. HR 102 bpm     Max Ex. BP 134/84     2 Minute Post BP 126/62        Initial Exercise Prescription:     Initial Exercise Prescription - 09/11/16 1400      Date of Initial Exercise RX and Referring Provider   Date 09/11/16   Referring Provider Serafina Royals MD     Treadmill   MPH 1.5   Grade 0.5   Minutes 15   METs 2.25     NuStep   Level 1   Watts --  80-100 spm   Minutes 15   METs 2     T5 Nustep   Level 1   Watts --  80-100 spm   Minutes 15   METs 2     Prescription Details   Frequency (times per week) 3   Duration Progress to 45 minutes of aerobic exercise without signs/symptoms of physical distress     Intensity   THRR 40-80% of Max Heartrate 108-135   Ratings of Perceived Exertion 11-15   Perceived Dyspnea 0-4     Progression   Progression Continue to progress workloads to maintain intensity without signs/symptoms of physical distress.     Resistance Training   Training Prescription Yes   Weight 3 lbs   Reps 10-12      Perform Capillary Blood Glucose checks as needed.  Exercise Prescription Changes:     Exercise Prescription Changes    Row Name 09/11/16 1400 09/18/16 1100 10/01/16 1300         Exercise Review    Progression -  walk test results  - Yes       Response to Exercise   Blood Pressure (Admit) 126/64 146/42 142/70     Blood Pressure (Exercise) 134/84 158/80 148/72     Blood Pressure (Exit) 126/62 132/80 148/60     Heart Rate (Admit) 76 bpm  - 81 bpm     Heart Rate (Exercise) 104 bpm 96 bpm 113 bpm     Heart Rate (Exit) 88 bpm 80 bpm 87 bpm     Rating of Perceived Exertion (Exercise) _0 Symptoms none none  -     Duration  -  - Progress to 45 minutes of aerobic exercise without signs/symptoms of physical distress     Intensity  -  - THRR unchanged       Progression   Progression  -  - Continue to progress workloads to maintain intensity without signs/symptoms of physical distress.     Average METs  - 1.8 2.2       Resistance Training   Training Prescription  - Yes Yes     Weight  - 3 3     Reps  - 10-12 10-12       Interval Training   Interval Training  - No No       Treadmill   MPH  -  - 1.5     Grade  -  - 0.5     Minutes  -  - 15     METs  -  - 2.25       NuStep   Level  - 1 1     Minutes  - 15 15     METs  - 1.7 2.1       T5 Nustep   Level  - 1  -     Minutes  - 15  -     METs  - 1.9  -        Exercise Comments:     Exercise Comments    Row Name 09/11/16 1458 09/18/16 1109 09/18/16 1721 10/01/16 1341     Exercise Comments Lawrence [name] wants to be able to play with his great granddaughter. Lawrence [name] first day of exercise went well.   Lawrence [name] felt like his BG was low during exercise.  It was 117 and he continued the session and will recheck BG before leaving today. Lawrence [name] is working on keeping his BG within limits for pre and post exercise.       Discharge Exercise Prescription (Final Exercise Prescription Changes):     Exercise Prescription Changes - 10/01/16 1300      Exercise Review   Progression Yes     Response to Exercise   Blood Pressure (Admit) 142/70   Blood Pressure (Exercise) 148/72   Blood Pressure (Exit) 148/60   Heart Rate (Admit) 81  bpm   Heart Rate (Exercise) 113 bpm   Heart Rate (Exit) 87 bpm   Rating of Perceived Exertion (Exercise) 12   Duration Progress to 45 minutes of aerobic exercise without signs/symptoms of physical distress   Intensity THRR unchanged     Progression   Progression Continue to progress workloads to maintain intensity without signs/symptoms of physical distress.   Average METs 2.2     Resistance Training   Training Prescription Yes   Weight 3   Reps 10-12     Interval Training   Interval Training No     Treadmill  MPH 1.5   Grade 0.5   Minutes 15   METs 2.25     NuStep   Level 1   Minutes 15   METs 2.1      Nutrition:  Target Goals: Understanding of nutrition guidelines, daily intake of sodium <1539m, cholesterol <2040m calories 30% from fat and 7% or less from saturated fats, daily to have 5 or more servings of fruits and vegetables.  Biometrics:     Pre Biometrics - 09/11/16 1459      Pre Biometrics   Height 5' 10" (1.778 m)   Weight 295 lb (133.8 kg)   Waist Circumference 51.25 inches   Hip Circumference 55.5 inches   Waist to Hip Ratio 0.92 %   BMI (Calculated) 42.4   Single Leg Stand 1.5 seconds       Nutrition Therapy Plan and Nutrition Goals:     Nutrition Therapy & Goals - 09/24/16 1642      Nutrition Therapy   Drug/Food Interactions Statins/Certain Fruits     Personal Nutrition Goals   Comments Lawrence Boguszsaid he has seen so many dieticians over the years and his blood sugars have been way up and way down some days. [facility]      Nutrition Discharge: Rate Your Plate Scores:   Nutrition Goals Re-Evaluation:   Psychosocial: Target Goals: Acknowledge presence or absence of depression, maximize coping skills, provide positive support system. Participant is able to verbalize types and ability to use techniques and skills needed for reducing stress and depression.  Initial Review & Psychosocial Screening:      Initial Psych Review & Screening - 09/24/16 1629      Initial Review   Current issues with Current Abuse or Neglect to Report      Quality of Life Scores:     Quality of Life - 09/11/16 1435      Quality of Life Scores   Health/Function Pre 25.25 %   Socioeconomic Pre 24.29 %   Psych/Spiritual Pre 28.29 %   Family Pre 30 %   GLOBAL Pre 26.41 %      PHQ-9: Recent Review Flowsheet Data    [facility] 09/11/2016 09/05/2016   Decreased Interest 0 0   Down, Depressed, Hopeless 1 0   PHQ - 2 Score 1 0   Altered sleeping 1 -   Tired, decreased energy 1 -   Change in appetite 0 -   Feeling bad or failure about yourself  1 -   Trouble concentrating 1 -   Moving slowly or fidgety/restless 1 -   Suicidal thoughts 0 -   PHQ-9 Score 6 -   Difficult doing work/chores Somewhat difficult -      Psychosocial Evaluation and Intervention:     Psychosocial Evaluation - 10/01/16 1717      Psychosocial Evaluation & Interventions   Comments Counselor met with Lawrence [name]) today for initial psychosocial evaluation.  He is [name] [age]r old who had CABGx4 in August.  He has [name] strong support system with [name] spouse of [id number]; some in-laws who live close by and Lawrence [name] involved in his local church.  He has Type 1 Diabetes and took issue that his records indicate Type 2.  He has sleep apnea as well but reports sleeping well with his CPAP.  His appetite is good.  He denies [name] history of depression or anxiety or current symptoms and states  he is typically in [name] positive mood.  Lawrence. [name] reports minimal stress in his life as he has learned strategies to "let go."  He has goals to be healthier overall and is also looking into [name] program for he and his spouse to exercise together upon completion here.  Staff will continue to follow with Lawrence. [name] throughout the course of this program.        Psychosocial Re-Evaluation:     Psychosocial Re-Evaluation    Lawrence [name] Name 09/24/16 1640              Psychosocial Re-Evaluation   Interventions Encouraged to attend Cardiac Rehabilitation for the exercise       Comments Lawrence "Lawrence [name]" reports that his weight went back up to above 300 last year when his mother in law with dementia moved in with them. Mother in law was in [name] nursing home briefly but he reports she fell and had to go to the [place]. Dept. so she came back home with them.           Vocational Rehabilitation: Provide vocational rehab assistance to qualifying candidates.   Vocational Rehab Evaluation & Intervention:     Vocational Rehab - 09/11/16 1448      Initial Vocational Rehab Evaluation & Intervention   Assessment shows need for Vocational Rehabilitation No      Education: Education Goals: Education classes will be provided on [name] weekly basis, covering required topics. Participant will state understanding/return demonstration of topics presented.  Learning Barriers/Preferences:     Learning Barriers/Preferences - 09/11/16 1447      Learning Barriers/Preferences   Learning Barriers Sight   Learning Preferences None      Education Topics: General Nutrition Guidelines/Fats and Fiber: -Group instruction provided by verbal, written material, models and posters to present the general guidelines for heart healthy nutrition. Gives an explanation and review of dietary fats and fiber.   Controlling Sodium/Reading Food Labels: -Group verbal and written material supporting the discussion of sodium use in heart healthy nutrition. Review and explanation with models, verbal and written materials for utilization of the food label. Flowsheet Row Cardiac Rehab from 10/06/2016 in [facility] Cardiac and Pulmonary Rehab  Date  09/22/16  Educator  PI  Instruction Review Code  2- meets goals/outcomes      Exercise Physiology & Risk Factors: - Group verbal and written instruction with models to review the exercise physiology of the cardiovascular system and associated critical values.  Details cardiovascular disease risk factors and the goals associated with each risk factor.   Aerobic Exercise & Resistance Training: - Gives group verbal and written discussion on the health impact of inactivity. On the components of aerobic and resistive training programs and the benefits of this training and how to safely progress through these programs. Flowsheet Row Cardiac Rehab from 10/06/2016 in [facility] Cardiac and Pulmonary Rehab  Date  09/29/16  Educator  K. Amedeo Plenty  Instruction Review Code  2- meets goals/outcomes      Flexibility, Balance, General Exercise Guidelines: - Provides group verbal and written instruction on the benefits of flexibility and balance training programs. Provides general exercise guidelines with specific guidelines to those with heart or lung disease. Demonstration and skill practice provided. Flowsheet Row Cardiac Rehab from 10/06/2016 in [facility] Cardiac and Pulmonary Rehab  Date  10/01/16  Educator  AS  Instruction Review Code  2- meets goals/outcomes      Stress Management: - Provides group verbal and written instruction about the health risks of  elevated stress, cause of high stress, and healthy ways to reduce stress.   Depression: - Provides group verbal and written instruction on the correlation between heart/lung disease and depressed mood, treatment options, and the stigmas associated with seeking treatment. Flowsheet Row Cardiac Rehab from 10/06/2016 in [facility] Cardiac and Pulmonary Rehab  Date  09/17/16  Educator  Berle Mull, MSW  Instruction Review Code  2- meets goals/outcomes      Anatomy & Physiology of the Heart: - Group verbal and written instruction and models provide basic cardiac anatomy and physiology, with the coronary electrical and arterial systems. Review of: AMI, Angina, Valve disease, Heart Failure, Cardiac Arrhythmia, Pacemakers, and the ICD. Flowsheet Row Cardiac Rehab from 10/06/2016 in [facility] Cardiac and Pulmonary Rehab  Date   10/06/16  Educator  SB  Instruction Review Code  2- meets goals/outcomes      Cardiac Procedures: - Group verbal and written instruction and models to describe the testing methods done to diagnose heart disease. Reviews the outcomes of the test results. Describes the treatment choices: Medical Management, Angioplasty, or Coronary Bypass Surgery.   Cardiac Medications: - Group verbal and written instruction to review commonly prescribed medications for heart disease. Reviews the medication, class of the drug, and side effects. Includes the steps to properly store meds and maintain the prescription regimen.   Go Sex-Intimacy & Heart Disease, Get SMART - Goal Setting: - Group verbal and written instruction through game format to discuss heart disease and the return to sexual intimacy. Provides group verbal and written material to discuss and apply goal setting through the application of the S.M.[name].R.T. Method.   Other Matters of the Heart: - Provides group verbal, written materials and models to describe Heart Failure, Angina, Valve Disease, and Diabetes in the realm of heart disease. Includes description of the disease process and treatment options available to the cardiac patient. Flowsheet Row Cardiac Rehab from 10/06/2016 in [facility] Cardiac and Pulmonary Rehab  Date  10/06/16  Educator  SB  Instruction Review Code  2- meets goals/outcomes      Exercise & Equipment Safety: - Individual verbal instruction and demonstration of equipment use and safety with use of the equipment. Flowsheet Row Cardiac Rehab from 10/06/2016 in [facility] Cardiac and Pulmonary Rehab  Date  09/11/16  Educator  SB  Instruction Review Code  2- meets goals/outcomes      Infection Prevention: - Provides verbal and written material to individual with discussion of infection control including proper hand washing and proper equipment cleaning during exercise session. Flowsheet Row Cardiac Rehab from 10/06/2016 in [facility]  Cardiac and Pulmonary Rehab  Date  09/11/16  Educator  SB  Instruction Review Code  2- meets goals/outcomes      Falls Prevention: - Provides verbal and written material to individual with discussion of falls prevention and safety. Flowsheet Row Cardiac Rehab from 10/06/2016 in [facility] Cardiac and Pulmonary Rehab  Date  09/11/16  Educator  SB  Instruction Review Code  2- meets goals/outcomes      Diabetes: - Individual verbal and written instruction to review signs/symptoms of diabetes, desired ranges of glucose level fasting, after meals and with exercise. Advice that pre and post exercise glucose checks will be done for 3 sessions at entry of program. [place] from 10/06/2016 in [facility] Cardiac and Pulmonary Rehab  Date  09/11/16  Educator  SB  Instruction Review Code  2- meets goals/outcomes       Knowledge Questionnaire Score:  Knowledge Questionnaire Score - 09/11/16 1447      Knowledge Questionnaire Score   Pre Score 20/28      Core Components/Risk Factors/Patient Goals at Admission:     Personal Goals and Risk Factors at Admission - 09/11/16 1432      Core Components/Risk Factors/Patient Goals on Admission    Weight Management Obesity;Weight Maintenance;Yes   Admit Weight 295 lb (133.8 kg)   Goal Weight: Short Term 290 lb (131.5 kg)   Goal Weight: Long Term 220 lb (99.8 kg)   Expected Outcomes Short Term: Continue to assess and modify interventions until short term weight is achieved;Long Term: Adherence to nutrition and physical activity/exercise program aimed toward attainment of established weight goal   Sedentary Yes   Intervention Provide advice, education, support and counseling about physical activity/exercise needs.;Develop an individualized exercise prescription for aerobic and resistive training based on initial evaluation findings, risk stratification, comorbidities and participant's personal goals.   Expected Outcomes Achievement of  increased cardiorespiratory fitness and enhanced flexibility, muscular endurance and strength shown through measurements of functional capacity and personal statement of participant.   Increase Strength and Stamina Yes   Intervention Provide advice, education, support and counseling about physical activity/exercise needs.;Develop an individualized exercise prescription for aerobic and resistive training based on initial evaluation findings, risk stratification, comorbidities and participant's personal goals.   Expected Outcomes Achievement of increased cardiorespiratory fitness and enhanced flexibility, muscular endurance and strength shown through measurements of functional capacity and personal statement of participant.   Diabetes Yes   Intervention Provide education about signs/symptoms and action to take for hypo/hyperglycemia.;Provide education about proper nutrition, including hydration, and aerobic/resistive exercise prescription along with prescribed medications to achieve blood glucose in normal ranges: Fasting glucose 65-99 mg/dL   Expected Outcomes Short Term: Participant verbalizes understanding of the signs/symptoms and immediate care of hyper/hypoglycemia, proper foot care and importance of medication, aerobic/resistive exercise and nutrition plan for blood glucose control.;Long Term: Attainment of HbA1C < 7%.   Hypertension Yes   Intervention Provide education on lifestyle modifcations including regular physical activity/exercise, weight management, moderate sodium restriction and increased consumption of fresh fruit, vegetables, and low fat dairy, alcohol moderation, and smoking cessation.;Monitor prescription use compliance.   Expected Outcomes Short Term: Continued assessment and intervention until BP is < 140/15m HG in hypertensive participants. < 130/831mHG in hypertensive participants with diabetes, heart failure or chronic kidney disease.;Long Term: Maintenance of blood pressure at  goal levels.   Lipids Yes  Statins cause muscle pain.   Crestor is new and so far doing well taking the Crestor   Intervention Provide education and support for participant on nutrition & aerobic/resistive exercise along with prescribed medications to achieve LDL <7023mHDL >64m9m Expected Outcomes Short Term: Participant states understanding of desired cholesterol values and is compliant with medications prescribed. Participant is following exercise prescription and nutrition guidelines.;Long Term: Cholesterol controlled with medications as prescribed, with individualized exercise RX and with personalized nutrition plan. Value goals: LDL < 70mg33mL > 40 mg.      Core Components/Risk Factors/Patient Goals Review:      Goals and Risk Factor Review    Row Name 09/24/16 1643             Core Components/Risk Factors/Patient Goals Review   Personal Goals Review Weight Management/Obesity;Diabetes;Stress       Review Lawrence [name] his blood sugars have been so high at times but at 3pm today his blood sugar was 47 and he ate  3 small [place] candy bars which usually helps him but when he arrive at Cardiac REhab at 4pm his initial finger stick blood sugar was 57. Asim ate 2 of our glucose tablets but said they dont help so given 2 Smartie candies.        Expected Outcomes Main goal is to get blood sugars in appropriate range. Decreased weight at some point.          Core Components/Risk Factors/Patient Goals at Discharge (Final Review):      Goals and Risk Factor Review - 09/24/16 1643      Core Components/Risk Factors/Patient Goals Review   Personal Goals Review Weight Management/Obesity;Diabetes;Stress   Review Lawrence [name]' said his blood sugars have been so high at times but at 3pm today his blood sugar was 47 and he ate 3 small [place] candy bars which usually helps him but when he arrive at Cardiac REhab at 4pm his initial finger stick blood sugar was 57. Lawrence [name] ate 2  of our glucose tablets but said they dont help so given 2 Smartie candies.    Expected Outcomes Main goal is to get blood sugars in appropriate range. Decreased weight at some point.      ITP Comments:     ITP Comments    Row Name 09/11/16 1430 09/11/16 1450 09/24/16 1643 10/02/16 1758 10/06/16 1812   ITP Comments [facility] review completed today Initial ITP created. Documentation of Diagnosis can be found in [facility]  Surgery 06/27/2016 [facility] review completed today Initial ITP created. Documentation of Diagnosis can be found in [facility]  Surgery 06/24/2016 [facility]" said he has seen so many dieticians over the years and his blood sugars have been way up and way down some days. He sees an Endocrine MD at the [place].  Lawrence [name] felt symptomatic for low BG and checked it himself without informing staff.  He was eating [name] candy bar and it was advised to stop by staff.  He was given glucose tabs, BG was 106 upon recheck and he was given crackers before leaving. [place] stsed he was experiencing pressure in his throat/chest / on pain scale. This occured while on the treadmill, he was slowed down and symptoms reduced/ He was stopped and the symptoms resolved.  He stated he was experiencing throat/sinus drainage today and felt stuffed up. He was advised to call his PMd and his cardiololgist to report the various symptoms. He had experienced the throat discomfort over the weekend at times too.    [place] Name 10/08/16 0611           ITP Comments 30 day review completed for review by Dr Emily Filbert.  Continue with ITP unless changes noted by Dr Sabra Heck.          Comments:

## 2016-10-08 NOTE — Progress Notes (Signed)
Subjective:    Patient ID: Lawrence [name], male    DOB: [birth year], 65 y.o.   MRN: [id number]  HPI  Lawrence [name] is a 65 year old male who presents today with a chief complaint of nasal congestion. He also reports sore throat, post nasal drip. He denies cough, fevers, sick contacts. His symptoms have been present for the past 4 days. He's been taking Dayquil with some improvement. He has a history of acute bronchitis and is worried this may be the beginning.  Review of Systems  Constitutional: Negative for chills and fever.  HENT: Positive for congestion, postnasal drip and sore throat.   Respiratory: Negative for cough and shortness of breath.   Cardiovascular: Negative for chest pain.       Past Medical History:  Diagnosis Date  . CAD (coronary artery disease)    07/2007  . Diabetes (HCC)   . Elevated lipids   . Glaucoma   . HBP (high blood pressure)   . Neuropathy (HCC)   . Osteoarthritis   . Skin cancer   . Sleep apnea    CPAP     Social History   Social History  . Marital status: Married    Spouse name: N/A  . Number of children: N/A  . Years of education: N/A   Occupational History  . Not on file.   Social History Main Topics  . Smoking status: Former Smoker    Packs/day: 1.00    Years: 16.00    Types: Cigarettes    Start date: 11/03/1969    Quit date: 07/22/1986  . Smokeless tobacco: Never Used  . Alcohol use 0.0 oz/week     Comment: OCCASIONALLY  . Drug use: No  . Sexual activity: Not on file   Other Topics Concern  . Not on file   Social History Narrative   Married.  Lives at home with wife.     Past Surgical History:  Procedure Laterality Date  . APPENDECTOMY    . CARDIAC CATHETERIZATION N/A 06/11/2016   Procedure: Left Heart Cath and Coronary Angiography;  Surgeon: Lamar Blinks, MD;  Location: [facility] INVASIVE CV LAB;  Service: Cardiovascular;  Laterality: N/A;  . CARPAL TUNNEL RELEASE Left 04/24/2016   Procedure: CARPAL TUNNEL RELEASE;   Surgeon: Kennedy Bucker, MD;  Location: [facility] ORS;  Service: Orthopedics;  Laterality: Left;  . CATARACT EXTRACTION W/ INTRAOCULAR LENS  IMPLANT, BILATERAL Bilateral   . CATARACT EXTRACTION, BILATERAL    . CORONARY ARTERY BYPASS GRAFT N/A 06/27/2016   Procedure: CORONARY ARTERY BYPASS GRAFTING times four using left internal mammary artery and right leg saphenous vein;  Surgeon: Kerin Perna, MD;  Location: [facility] OR;  Service: Open Heart Surgery;  Laterality: N/A;  . EYE SURGERY Bilateral   . JOINT REPLACEMENT    . KNEE ARTHROPLASTY Left 02/11/2016   Procedure: COMPUTER ASSISTED TOTAL KNEE ARTHROPLASTY;  Surgeon: Donato Heinz, MD;  Location: [facility] ORS;  Service: Orthopedics;  Laterality: Left;  . KNEE ARTHROSCOPY Right   . TEE WITHOUT CARDIOVERSION N/A 06/27/2016   Procedure: TRANSESOPHAGEAL ECHOCARDIOGRAM (TEE);  Surgeon: Kerin Perna, MD;  Location: [facility] OR;  Service: Open Heart Surgery;  Laterality: N/A;    Family History  Problem Relation Age of Onset  . Hypertension Mother   . Lupus Mother   . Hypertension Father   . Diabetes Father   . Heart attack Father 35    died in his 35s  . Arthritis Maternal Grandmother   .  Arthritis Maternal Grandfather   . Arthritis Paternal Grandmother   . Arthritis Paternal Grandfather     Allergies  Allergen Reactions  . Nitroglycerin Nausea Only and Other (See Comments)    Patches only - headache  . Statins Other (See Comments)    BODY PAIN   . Lipitor [Atorvastatin] Other (See Comments) and Rash    BODY PAIN  . Lisinopril Other (See Comments)    Light headed, dizzy    Current Outpatient Prescriptions on File Prior to Visit  Medication Sig Dispense Refill  . aspirin 81 MG EC tablet Take 81 mg by mouth daily. Swallow whole.    . cholecalciferol (VITAMIN D) 1000 units tablet Take 1,000 Units by mouth daily.    [name] docusate sodium (COLACE) 100 MG capsule Take 200 mg by mouth 2 (two) times daily.    . furosemide (LASIX) 40 MG tablet Take 1 tablet  (40 mg total) by mouth daily. 30 tablet 1  . glucagon (GLUCAGON EMERGENCY) 1 MG injection Inject 1 mg into the vein once as needed. Reported on 02/11/2016    . insulin aspart (NOVOLOG) 100 UNIT/ML injection Inject 20 Units into the skin 3 (three) times daily. (Patient taking differently: Inject 20 Units into the skin 3 (three) times daily with meals. ) 10 mL 5  . insulin glargine (LANTUS) 100 UNIT/ML injection Inject 20 Units into the skin at bedtime.     [name] latanoprost (XALATAN) 0.005 % ophthalmic solution Place 1 drop into both eyes at bedtime.    [name] loratadine (CLARITIN) 10 MG tablet Take 10 mg by mouth daily.    . metoprolol tartrate (LOPRESSOR) 25 MG tablet Take 25 mg by mouth 2 (two) times daily.    . Multiple Vitamin (MULTIVITAMIN) capsule Take 1 capsule by mouth daily.    . potassium chloride SA (K-DUR,KLOR-CON) 20 MEQ tablet Take 1 tablet (20 mEq total) by mouth daily. 30 tablet 1  . rosuvastatin (CRESTOR) 20 MG tablet Take 1 tablet (20 mg total) by mouth daily at 6 PM. 30 tablet 1  . vitamin C (ASCORBIC ACID) 500 MG tablet Take 500 mg by mouth 2 (two) times daily.      No current facility-administered medications on file prior to visit.     BP 136/66   Pulse 72   Temp 98 F (36.7 C) (Oral)   Ht 5\' 11"  (1.803 m)   Wt 296 lb 12.8 oz (134.6 kg)   SpO2 98%   BMI 41.40 kg/m    Objective:   Physical Exam  Constitutional: He appears well-nourished. He does not appear ill.  HENT:  Right Ear: Tympanic membrane and ear canal normal.  Left Ear: Tympanic membrane and ear canal normal.  Nose: No mucosal edema. Right sinus exhibits no maxillary sinus tenderness and no frontal sinus tenderness. Left sinus exhibits no maxillary sinus tenderness and no frontal sinus tenderness.  Mouth/Throat: Oropharynx is clear and moist.  Eyes: Conjunctivae are normal.  Neck: Neck supple.  Cardiovascular: Normal rate and regular rhythm.   Pulmonary/Chest: Effort normal and breath sounds normal. He has no  wheezes. He has no rales.  Skin: Skin is warm and dry.          Assessment & Plan:  URI:  Sore throat, postnasal drip, congestion 4 days. Some improvement with OTC treatment. Exam today with clear lungs, stable vitals, does not appear acutely ill. Suspect allergy/viral involvement will treat with supportive measures. Discussed use of antihistamine, Mucinex, Flonase. Return precautions provided.  Evyn Putzier  Janey Greaser, NP

## 2016-10-08 NOTE — Progress Notes (Signed)
Daily Session Note  Patient Details  Name: STEVE YOUNGBERG MRN: [id number] Date of Birth: [birth year] Referring Provider:   Flowsheet Row Cardiac Rehab from 09/11/2016 in [facility] Cardiac and Pulmonary Rehab  Referring Provider  Serafina Royals MD      Encounter Date: 10/08/2016  Check In:     Session Check In - 10/08/16 1745      Check-In   Location [facility]-Cardiac & Pulmonary Rehab   Staff Present Gerlene Burdock, RN, Vickki Hearing, BA, ACSM CEP, Exercise Physiologist  Jena Gauss, RN   Supervising physician immediately available to respond to emergencies See telemetry face sheet for immediately available ER MD   Medication changes reported     No   Fall or balance concerns reported    No   Warm-up and Cool-down Performed on first and last piece of equipment   Resistance Training Performed Yes   VAD Patient? No     Pain Assessment   Currently in Pain? No/denies         Goals Met:  Proper associated with RPD/PD & O2 Sat Exercise tolerated well  Goals Unmet:  Not Applicable  Comments:     Dr. Emily Filbert is Medical Director for [place] and [facility].

## 2016-10-08 NOTE — Progress Notes (Signed)
Pre visit review using our clinic review tool, if applicable. No additional management support is needed unless otherwise documented below in the visit note. 

## 2016-10-08 NOTE — Patient Instructions (Signed)
Your symptoms are representative of a viral illness which will resolve on its own over time. Our goal is to treat your symptoms in order to aid your body in the healing process and to make you more comfortable.   Cough/Congestion: Try taking Mucinex DM. This will help loosen up the mucous in your chest. Ensure you take this medication with a full glass of water.  Nasal Congestion/Ear Pressure: Try using Flonase (fluticasone) nasal spray. Instill 1 spray in each nostril twice daily.   Throat drainage: Try fexofenadine (Allegra) rather than Loratidine or Zyrtec.  Please notify me if you develop persistent fevers of 101, start coughing up green mucous, notice increased fatigue or weakness, or feel worse after 1 week of onset of symptoms.   Increase consumption of water intake and rest.  It was a pleasure to see you today!

## 2016-10-09 DIAGNOSIS — Z951 Presence of aortocoronary bypass graft: Secondary | ICD-10-CM

## 2016-10-09 LAB — GLUCOSE, CAPILLARY: Glucose-Capillary: 243 mg/dL — ABNORMAL HIGH (ref 65–99)

## 2016-10-09 NOTE — Progress Notes (Signed)
Daily Session Note  Patient Details  Name: Lawrence [name] MRN: [id number] Date of Birth: August 08, 1951 Referring Provider:   Flowsheet Row Cardiac Rehab from 09/11/2016 in [facility] Cardiac and Pulmonary Rehab  Referring Provider  Serafina Royals MD      Encounter Date: 10/09/2016  Check In:     Session Check In - 10/09/16 1711      Check-In   Location [facility]-Cardiac & Pulmonary Rehab   Staff Present Earlean Shawl, BS, ACSM CEP, Exercise Physiologist;Floetta Brickey Oletta Darter, BA, ACSM CEP, Exercise Physiologist;Other  Jena Gauss RN   Supervising physician immediately available to respond to emergencies See telemetry face sheet for immediately available ER MD   Medication changes reported     No   Fall or balance concerns reported    No   Warm-up and Cool-down Performed on first and last piece of equipment   Resistance Training Performed Yes   VAD Patient? No     Pain Assessment   Currently in Pain? No/denies   Multiple Pain Sites No           Exercise Prescription Changes - 10/09/16 1000      Home Exercise Plan   Plans to continue exercise at Home   Frequency Add 1 additional day to program exercise sessions.  walking driveway, possibly join FF or Y with his wife      Goals Met:  Proper associated with RPD/PD & O2 Sat Independence with exercise equipment Exercise tolerated well Strength training completed today  Goals Unmet:  Not Applicable  Comments: Mr Snelson asked for a BG check after cardio - it was 243 and BP was 134/68.  He has had a sinus infection and has a headache.  He said he felt ok to do stretching at end of session.   Dr. Emily Filbert is Medical Director for [place] and [facility].

## 2016-10-12 ENCOUNTER — Telehealth: Payer: Self-pay | Admitting: Primary Care

## 2016-10-12 NOTE — Telephone Encounter (Signed)
Please notify Lawrence [name] that I received his Cologuard report which was negative. This means he has a low likelihood of colon cancer. We will retest in 3 years.

## 2016-10-13 ENCOUNTER — Encounter: Payer: Self-pay | Admitting: *Deleted

## 2016-10-13 NOTE — Telephone Encounter (Signed)
Letter mailed notifying patient. 

## 2016-10-17 ENCOUNTER — Ambulatory Visit (INDEPENDENT_AMBULATORY_CARE_PROVIDER_SITE_OTHER): Payer: Medicare Other | Admitting: Podiatry

## 2016-10-17 ENCOUNTER — Encounter: Payer: Self-pay | Admitting: Podiatry

## 2016-10-17 DIAGNOSIS — S91209D Unspecified open wound of unspecified toe(s) with damage to nail, subsequent encounter: Secondary | ICD-10-CM | POA: Diagnosis not present

## 2016-10-17 DIAGNOSIS — E1143 Type 2 diabetes mellitus with diabetic autonomic (poly)neuropathy: Secondary | ICD-10-CM | POA: Diagnosis not present

## 2016-10-17 DIAGNOSIS — I70245 Atherosclerosis of native arteries of left leg with ulceration of other part of foot: Secondary | ICD-10-CM | POA: Diagnosis not present

## 2016-10-17 DIAGNOSIS — L97509 Non-pressure chronic ulcer of other part of unspecified foot with unspecified severity: Secondary | ICD-10-CM | POA: Diagnosis not present

## 2016-10-17 DIAGNOSIS — M79676 Pain in unspecified toe(s): Secondary | ICD-10-CM | POA: Diagnosis not present

## 2016-10-17 DIAGNOSIS — E08621 Diabetes mellitus due to underlying condition with foot ulcer: Secondary | ICD-10-CM

## 2016-10-17 DIAGNOSIS — L97522 Non-pressure chronic ulcer of other part of left foot with fat layer exposed: Secondary | ICD-10-CM | POA: Diagnosis not present

## 2016-10-17 DIAGNOSIS — E0843 Diabetes mellitus due to underlying condition with diabetic autonomic (poly)neuropathy: Secondary | ICD-10-CM | POA: Diagnosis not present

## 2016-10-17 DIAGNOSIS — S91109D Unspecified open wound of unspecified toe(s) without damage to nail, subsequent encounter: Secondary | ICD-10-CM | POA: Diagnosis not present

## 2016-10-19 NOTE — Progress Notes (Signed)
Subjective:  Patient with a history of diabetes mellitus presents today for evaluation ulceration(s) to the lower extremitie(s). Patient states that he did have a history of diabetes mellitus. Patient also presents today for follow-up evaluation of a partial permanent nail avulsion to the right great toe medial border. patient states that he is doing very well Patient presents today for further treatment and evaluation   Objective/Physical Exam General: The patient is alert and oriented x3 in no acute distress.  Dermatology:  Wound #1 noted to the distal tuft of the second digit left foot measuring [phone] cm (LxWxD).   To the noted ulceration(s), there is no eschar. There is a moderate amount of slough, fibrin, and necrotic tissue noted. Granulation tissue and wound base is red. There is a minimal amount of serosanguineous drainage noted. There is no exposed bone muscle-tendon ligament or joint. There is no malodor. Periwound integrity is intact.  Open wound of toenail noted to the right great toe medial border due to toenail avulsion. Wound base is granular. Minimal amount of fibrotic debris noted. There is no drainage. There is no odor. No sign of infectious process. Skin is warm, dry and supple bilateral lower extremities.  Vascular: Palpable pedal pulses bilaterally. No edema or erythema noted. Capillary refill within normal limits.  Neurological: Epicritic and protective threshold absent bilaterally.   Musculoskeletal Exam: Pain on palpation noted to the medial aspect of the right great toenail along the periungual border suggestive of ingrowing nail. Range of motion within normal limits to all pedal and ankle joints bilateral. Muscle strength 5/5 in all groups bilateral.   Assessment: #1 ulceration second digit left foot secondary to diabetes mellitus #2 diabetes mellitus w/ peripheral neuropathy #3 open wound right great toe medial border secondary toenail avulsion  Plan of  Care:  #1 Patient was evaluated. #2 medically necessary excisional debridement including subcutaneous tissue was performed using a tissue nipper and a chisel blade. Excisional debridement of all the necrotic nonviable tissue down to healthy bleeding viable tissue was performed with post-debridement measurements same as pre-. #3 the wound was cleansed and dry sterile dressing applied. #4 excisional debridement of open wound was performed using a curet and a tissue nipper to the right great toe medial border. All necrotic nonviable tissue was debrided down to healthy tissue. #5 patient will require medically necessary skin graft application to the second digit left foot. All conservative measures have been unsuccessful in providing any sort of satisfactory alleviation of symptoms for the patient. The patient has had this wound for greater than 6 weeks.  #6 patient is to return to clinic in 2 weeks.   Dr. [name], [place]

## 2016-10-20 ENCOUNTER — Telehealth: Payer: Self-pay | Admitting: *Deleted

## 2016-10-20 DIAGNOSIS — Z951 Presence of aortocoronary bypass graft: Secondary | ICD-10-CM | POA: Diagnosis not present

## 2016-10-20 LAB — GLUCOSE, CAPILLARY: Glucose-Capillary: 132 mg/dL — ABNORMAL HIGH (ref 65–99)

## 2016-10-20 NOTE — Telephone Encounter (Addendum)
-----  Message from [name], DPM sent at 10/19/2016  4:26 PM EST ----- Regarding: Skin graft authorization I'd like to try and get the patient approved for the [place]. Patient has a diabetic foot ulcer 2nd digit left foot > 6 weeks.   Thank you, Dr. Amalia Hailey. 10/20/2016-Required form, clinicals and demographics faxed to [place]. 10/21/2016-I called pt and asked when his next appt was and to please schedule because we would order the graft for that date if it was approved by his insurance. Pt called again appt 11/07/2016. 10/23/2016-Jim - [employer] states need codes for diabetes. Left message for Clair Gulling to call with more information. 10/29/2016-Left message informing Hardie Lora I would fax with more diabetic codes. Faxed with additional diabetic codes. 2016-11-04-I asked pt if 11/07/2016 was still his next appt date, because I had checked and there was no appt. Pt asked if I had checked in [place]. [place] acct and I rechecked and I had. Pt states his ftr Luther P. Mathwig 05/29/1927 had passed away sometime back.Meghan - [employer] states since pt has appt at the beginning of 2018, will they will resubmit on 11/04/2016 for 2018 insurance coverage. 10/31/2016-faxed order form for 1st [place].11/05/2016-I informed pt of [employer] insurance coverage, 80% covered after $183.00 annual deductible was met, the pt would pay 20% of $900.00 Omnigraft cost = $180.00. So 1st Omnigraft application would be $496.75, then each following Omnigraft would be $916.38 for up to 5 applications. Pt states he would like to begin the graft. I told him it would be in the [place] office at the time of his 11/07/2016 visit.

## 2016-10-20 NOTE — Progress Notes (Signed)
Daily Session Note  Patient Details  Name: Lawrence [name] MRN: [id number] Date of Birth: [birth year] Referring Provider:   Flowsheet Row Cardiac Rehab from 09/11/2016 in [facility] Cardiac and Pulmonary Rehab  Referring Provider  Serafina Royals MD      Encounter Date: 10/20/2016  Check In:     Session Check In - 10/20/16 1745      Check-In   Warm-up and Cool-down Performed on first and last piece of equipment   Resistance Training Performed Yes   VAD Patient? No     Pain Assessment   Currently in Pain? No/denies   Multiple Pain Sites No         Goals Met:  Independence with exercise equipment  Goals Unmet:  Not Applicable  Comments: Patient completed exercise prescription and all exercise goals during rehab session. The exercise was tolerated well and the patient is progressing in the program.    Dr. Emily Filbert is Medical Director for [place] and [facility].

## 2016-10-22 DIAGNOSIS — Z951 Presence of aortocoronary bypass graft: Secondary | ICD-10-CM | POA: Diagnosis not present

## 2016-10-22 NOTE — Progress Notes (Signed)
Daily Session Note  Patient Details  Name: Lawrence [name] MRN: [id number] Date of Birth: 06-03-1951 Referring Provider:   Flowsheet Row Cardiac Rehab from 09/11/2016 in [facility] Cardiac and Pulmonary Rehab  Referring Provider  Serafina Royals MD      Encounter Date: 10/22/2016  Check In:     Session Check In - 10/22/16 1622      Check-In   Location [facility]-Cardiac & Pulmonary Rehab   Staff Present Nyoka Cowden, RN, BSN, MA;Cashay Manganelli, RN, BSN, Lance Sell, BA, ACSM CEP, Exercise Physiologist   Supervising physician immediately available to respond to emergencies See telemetry face sheet for immediately available ER MD   Medication changes reported     No   Fall or balance concerns reported    No   Warm-up and Cool-down Performed on first and last piece of equipment   Resistance Training Performed Yes   VAD Patient? No     Pain Assessment   Currently in Pain? No/denies   Multiple Pain Sites No         Goals Met:  Independence with exercise equipment Exercise tolerated well Personal goals reviewed No report of cardiac concerns or symptoms Strength training completed today  Goals Unmet:  Not Applicable  Comments: Doing well with exercise prescription progression.    Dr. Emily Filbert is Medical Director for [place] and [facility].

## 2016-10-23 DIAGNOSIS — Z951 Presence of aortocoronary bypass graft: Secondary | ICD-10-CM

## 2016-10-23 LAB — GLUCOSE, CAPILLARY
Glucose-Capillary: 109 mg/dL — ABNORMAL HIGH (ref 65–99)
Glucose-Capillary: 79 mg/dL (ref 65–99)

## 2016-10-23 NOTE — Progress Notes (Signed)
Daily Session Note  Patient Details  Name: Lawrence [name] MRN: [id number] Date of Birth: 03/17/1951 Referring Provider:   Flowsheet Row Cardiac Rehab from 09/11/2016 in [facility] Cardiac and Pulmonary Rehab  Referring Provider  Serafina Royals MD      Encounter Date: 10/23/2016  Check In:     Session Check In - 10/23/16 1700      Check-In   Location [facility]-Cardiac & Pulmonary Rehab   Staff Present Earlean Shawl, BS, ACSM CEP, Exercise Physiologist;Amanda Oletta Darter, BA, ACSM CEP, Exercise Physiologist;Patricia Surles RN BSN   Supervising physician immediately available to respond to emergencies See telemetry face sheet for immediately available ER MD   Medication changes reported     No   Fall or balance concerns reported    No   Warm-up and Cool-down Performed on first and last piece of equipment   Resistance Training Performed Yes   VAD Patient? No     Pain Assessment   Currently in Pain? No/denies   Multiple Pain Sites No         Goals Met:  Independence with exercise equipment No report of cardiac concerns or symptoms Strength training completed today  Goals Unmet:  Not Applicable  Comments: Pt able to follow exercise prescription today without complaint.  Will continue to monitor for progression.    Dr. Emily Filbert is Medical Director for [place] and [facility].

## 2016-10-29 ENCOUNTER — Encounter: Payer: Medicare Other | Admitting: *Deleted

## 2016-10-29 DIAGNOSIS — Z951 Presence of aortocoronary bypass graft: Secondary | ICD-10-CM | POA: Diagnosis not present

## 2016-10-29 LAB — GLUCOSE, CAPILLARY: Glucose-Capillary: 154 mg/dL — ABNORMAL HIGH (ref 65–99)

## 2016-10-29 NOTE — Progress Notes (Signed)
Daily Session Note  Patient Details  Name: Lawrence [name] MRN: [id number] Date of Birth: 11-27-1950 Referring Provider:   Flowsheet Row Cardiac Rehab from 09/11/2016 in [facility] Cardiac and Pulmonary Rehab  Referring Provider  Serafina Royals MD      Encounter Date: 10/29/2016  Check In:     Session Check In - 10/29/16 1700      Check-In   Location [facility]-Cardiac & Pulmonary Rehab   Staff Present Gerlene Burdock, RN, BSN;Laureen Owens Shark, BS, RRT, Respiratory Therapist;Patricia Surles RN BSN   Supervising physician immediately available to respond to emergencies See telemetry face sheet for immediately available ER MD   Medication changes reported     No   Fall or balance concerns reported    No   Warm-up and Cool-down Performed on first and last piece of equipment   Resistance Training Performed Yes   VAD Patient? No     Pain Assessment   Currently in Pain? No/denies         Goals Met:  Proper associated with RPD/PD & O2 Sat Exercise tolerated well  Goals Unmet:  Not Applicable  Comments:     Dr. Emily Filbert is Medical Director for [place] and [facility].

## 2016-10-30 ENCOUNTER — Encounter: Payer: Medicare Other | Admitting: *Deleted

## 2016-10-30 DIAGNOSIS — Z951 Presence of aortocoronary bypass graft: Secondary | ICD-10-CM

## 2016-10-30 LAB — GLUCOSE, CAPILLARY: Glucose-Capillary: 125 mg/dL — ABNORMAL HIGH (ref 65–99)

## 2016-10-30 NOTE — Progress Notes (Signed)
Daily Session Note  Patient Details  Name: FRENCHIE DANGERFIELD MRN: [id number] Date of Birth: [birth year] Referring Provider:   Flowsheet Row Cardiac Rehab from 09/11/2016 in [facility] Cardiac and Pulmonary Rehab  Referring Provider  Serafina Royals MD      Encounter Date: 10/30/2016  Check In:     Session Check In - 10/30/16 1654      Check-In   Location [facility]-Cardiac & Pulmonary Rehab   Staff Present Gerlene Burdock, RN, Alex Gardener, DPT, CEEA;Patricia Surles RN BSN   Supervising physician immediately available to respond to emergencies See telemetry face sheet for immediately available ER MD   Medication changes reported     No   Fall or balance concerns reported    No   Warm-up and Cool-down Performed on first and last piece of equipment   Resistance Training Performed Yes   VAD Patient? No     Pain Assessment   Currently in Pain? No/denies         Goals Met:  Proper associated with RPD/PD & O2 Sat Exercise tolerated well  Goals Unmet:  Not Applicable  Comments:     Dr. Emily Filbert is Medical Director for [place] and [facility].

## 2016-11-04 ENCOUNTER — Encounter: Payer: Self-pay | Admitting: *Deleted

## 2016-11-04 DIAGNOSIS — Z951 Presence of aortocoronary bypass graft: Secondary | ICD-10-CM

## 2016-11-04 NOTE — Progress Notes (Signed)
Cardiac Individual Treatment Plan  Patient Details  Name: Lawrence [name] MRN: [id number] Date of Birth: 11-10-1950 Referring Provider:   Flowsheet Row Cardiac Rehab from 09/11/2016 in [facility] Cardiac and Pulmonary Rehab  Referring Provider  Serafina Royals MD      Initial Encounter Date:  Flowsheet Row Cardiac Rehab from 09/11/2016 in [facility] Cardiac and Pulmonary Rehab  Date  09/11/16  Referring Provider  Serafina Royals MD      Visit Diagnosis: S/P CABG x 4  Patient's Home Medications on Admission:  Current Outpatient Prescriptions:  .  aspirin 81 MG EC tablet, Take 81 mg by mouth daily. Swallow whole., Disp: , Rfl:  .  cholecalciferol (VITAMIN D) 1000 units tablet, Take 1,000 Units by mouth daily., Disp: , Rfl:  .  docusate sodium (COLACE) 100 MG capsule, Take 200 mg by mouth 2 (two) times daily., Disp: , Rfl:  .  furosemide (LASIX) 40 MG tablet, Take 1 tablet (40 mg total) by mouth daily., Disp: 30 tablet, Rfl: 1 .  glucagon (GLUCAGON EMERGENCY) 1 MG injection, Inject 1 mg into the vein once as needed. Reported on 02/11/2016, Disp: , Rfl:  .  insulin aspart (NOVOLOG) 100 UNIT/ML injection, Inject 20 Units into the skin 3 (three) times daily. (Patient taking differently: Inject 20 Units into the skin 3 (three) times daily with meals. ), Disp: 10 mL, Rfl: 5 .  insulin glargine (LANTUS) 100 UNIT/ML injection, Inject 20 Units into the skin at bedtime. , Disp: , Rfl:  .  latanoprost (XALATAN) 0.005 % ophthalmic solution, Place 1 drop into both eyes at bedtime., Disp: , Rfl:  .  loratadine (CLARITIN) 10 MG tablet, Take 10 mg by mouth daily., Disp: , Rfl:  .  metoprolol tartrate (LOPRESSOR) 25 MG tablet, Take 25 mg by mouth 2 (two) times daily., Disp: , Rfl:  .  Multiple Vitamin (MULTIVITAMIN) capsule, Take 1 capsule by mouth daily., Disp: , Rfl:  .  potassium chloride SA (K-DUR,KLOR-CON) 20 MEQ tablet, Take 1 tablet (20 mEq total) by mouth daily., Disp: 30 tablet, Rfl: 1 .  rosuvastatin  (CRESTOR) 20 MG tablet, Take 1 tablet (20 mg total) by mouth daily at 6 PM., Disp: 30 tablet, Rfl: 1 .  vitamin C (ASCORBIC ACID) 500 MG tablet, Take 500 mg by mouth 2 (two) times daily. , Disp: , Rfl:   Past Medical History: Past Medical History:  Diagnosis Date  . CAD (coronary artery disease)    07/2007  . Diabetes ([place])   . Elevated lipids   . Glaucoma   . HBP (high blood pressure)   . Neuropathy ([place])   . Osteoarthritis   . Skin cancer   . Sleep apnea    CPAP    Tobacco Use: History  Smoking Status  . Former Smoker  . Packs/day: 1.00  . Years: 16.00  . Types: Cigarettes  . Start date: 11/03/1969  . Quit date: 07/22/1986  Smokeless Tobacco  . Never Used    Labs: Recent Review Flowsheet Data    Labs for ITP Cardiac and Pulmonary Rehab Latest Ref Rng & Units 06/27/2016 06/27/2016 06/27/2016 06/28/2016 09/08/2016   Cholestrol 0 - 200 mg/dL - - - - 164   LDLCALC 0 - 99 mg/dL - - - - 69   HDL >39.00 mg/dL - - - - 81.00   Trlycerides 0.0 - 149.0 mg/dL - - - - 69.0   Hemoglobin A1c 4.6 - 6.5 % - - - - 7.9(H)   PHART 7.350 -  7.450 7.372 - 7.390 - -   PCO2ART 35.0 - 45.0 mmHg 46.1(H) - 45.2(H) - -   HCO3 20.0 - 24.0 mEq/L 26.8(H) - 27.3(H) - -   TCO2 0 - 100 mmol/L _0 -   O2SAT % 97.0 - 96.0 - -       Exercise Target Goals:    Exercise Program Goal: Individual exercise prescription set with THRR, safety & activity barriers. Participant demonstrates ability to understand and report RPE using BORG scale, to self-measure pulse accurately, and to acknowledge the importance of the exercise prescription.  Exercise Prescription Goal: Starting with aerobic activity 30 plus minutes a day, 3 days per week for initial exercise prescription. Provide home exercise prescription and guidelines that participant acknowledges understanding prior to discharge.  Activity Barriers & Risk Stratification:     Activity Barriers & Cardiac Risk Stratification - 09/11/16 1446       Activity Barriers & Cardiac Risk Stratification   Activity Barriers Left Knee Replacement;Arthritis;Muscular Weakness;Deconditioning;Assistive Device;History of Falls;Balance Concerns;Other (comment)   Comments neuropathy in both legs and feet   Cardiac Risk Stratification High      6 Minute Walk:     6 Minute Walk    Row Name 09/11/16 1455         6 Minute Walk   Phase Initial     Distance 1084 feet     Walk Time 6 minutes     # of Rest Breaks 0     MPH 2.05     METS 2.57     RPE 13     VO2 Peak 6.43     Symptoms No     Resting HR 72 bpm     Resting BP 126/64     Max Ex. HR 102 bpm     Max Ex. BP 134/84     2 Minute Post BP 126/62        Initial Exercise Prescription:     Initial Exercise Prescription - 09/11/16 1400      Date of Initial Exercise RX and Referring Provider   Date 09/11/16   Referring Provider Serafina Royals MD     Treadmill   MPH 1.5   Grade 0.5   Minutes 15   METs 2.25     NuStep   Level 1   Watts --  80-100 spm   Minutes 15   METs 2     T5 Nustep   Level 1   Watts --  80-100 spm   Minutes 15   METs 2     Prescription Details   Frequency (times per week) 3   Duration Progress to 45 minutes of aerobic exercise without signs/symptoms of physical distress     Intensity   THRR 40-80% of Max Heartrate 108-135   Ratings of Perceived Exertion 11-15   Perceived Dyspnea 0-4     Progression   Progression Continue to progress workloads to maintain intensity without signs/symptoms of physical distress.     Resistance Training   Training Prescription Yes   Weight 3 lbs   Reps 10-12      Perform Capillary Blood Glucose checks as needed.  Exercise Prescription Changes:     Exercise Prescription Changes    Row Name 09/11/16 1400 09/18/16 1100 10/01/16 1300 10/09/16 1000 10/16/16 1000     Exercise Review   Progression -  walk test results  - Yes  - Yes     Response to Exercise  Blood Pressure (Admit) 126/64 146/42 142/70   - 126/68   Blood Pressure (Exercise) 134/84 158/80 148/72  - 140/64   Blood Pressure (Exit) 126/62 132/80 148/60  - 136/64   Heart Rate (Admit) 76 bpm  - 81 bpm  - 94 bpm   Heart Rate (Exercise) 104 bpm 96 bpm 113 bpm  - 105 bpm   Heart Rate (Exit) 88 bpm 80 bpm 87 bpm  - 86 bpm   Rating of Perceived Exertion (Exercise) _0 - 14   Symptoms none none  -  - none   Duration  -  - Progress to 45 minutes of aerobic exercise without signs/symptoms of physical distress  - Progress to 45 minutes of aerobic exercise without signs/symptoms of physical distress   Intensity  -  - THRR unchanged  - THRR unchanged     Progression   Progression  -  - Continue to progress workloads to maintain intensity without signs/symptoms of physical distress.  - Continue to progress workloads to maintain intensity without signs/symptoms of physical distress.   Average METs  - 1.8 2.2  - 2.25     Resistance Training   Training Prescription  - Yes Yes  - Yes   Weight  - 3 3  - 3   Reps  - 10-12 10-12  - 10-12     Interval Training   Interval Training  - No No  - No     Treadmill   MPH  -  - 1.5  -  -   Grade  -  - 0.5  -  -   Minutes  -  - 15  -  -   METs  -  - 2.25  -  -     NuStep   Level  - 1 1  - 1   Minutes  - 15 15  - 15   METs  - 1.7 2.1  - 2.4     T5 Nustep   Level  - 1  -  - 1   Minutes  - 15  -  - 15   METs  - 1.9  -  - 2.1     Home Exercise Plan   Plans to continue exercise at  -  -  - Home  -   Frequency  -  -  - Add 1 additional day to program exercise sessions.  walking driveway, possibly join FF or Y with his wife  -   Row Name 10/31/16 0900             Exercise Review   Progression Yes         Response to Exercise   Blood Pressure (Admit) 132/64       Blood Pressure (Exercise) 148/80       Blood Pressure (Exit) 138/70       Heart Rate (Admit) 80 bpm       Heart Rate (Exercise) 120 bpm       Heart Rate (Exit) 91 bpm       Rating of Perceived Exertion (Exercise) 14        Symptoms none       Comments Home Exercise Guidelines given 10/09/16       Duration Progress to 45 minutes of aerobic exercise without signs/symptoms of physical distress       Intensity THRR unchanged         Progression   Progression Continue to progress workloads  to maintain intensity without signs/symptoms of physical distress.       Average METs 3.21         Resistance Training   Training Prescription Yes       Weight 3       Reps 10-12         Interval Training   Interval Training No         Treadmill   MPH 1.5       Grade 0.5       Minutes 15       METs 2.25         NuStep   Level 1       Minutes 15       METs 4.4         T5 Nustep   Level 4       Minutes 15       METs 3         Home Exercise Plan   Plans to continue exercise at Home       Frequency Add 1 additional day to program exercise sessions.  walking driveway, possibly join FF or Y with his wife          Exercise Comments:     Exercise Comments    Row Name 09/11/16 1458 09/18/16 1109 09/18/16 1721 10/01/16 1341 10/09/16 1713   Exercise Comments [name] wants to be able to play with his great granddaughter. Mr Oki first day of exercise went well.   [place] felt like his BG was low during exercise.  It was 117 and he continued the session and will recheck BG before leaving today. Mr Carswell is working on keeping his BG within limits for pre and post exercise. Mr Rutigliano asked for a BG check after cardio - it was 243 and BP was 134/68.  He has had a sinus infection and has a headache.  He said he felt ok to do stretching at end of session.   [place] Name 10/16/16 1036 10/23/16 1736 10/31/16 0950       Exercise Comments [name] is progressing well with exercise. Man[place] felt BG was low after 13 min. on TM.  It was 79.  He was given glucose gel and had crackers and it was 109 15 min later. [name] has continued to do well in rehab.  We will continue to monitor his progression.        Discharge Exercise Prescription  (Final Exercise Prescription Changes):     Exercise Prescription Changes - 10/31/16 0900      Exercise Review   Progression Yes     Response to Exercise   Blood Pressure (Admit) 132/64   Blood Pressure (Exercise) 148/80   Blood Pressure (Exit) 138/70   Heart Rate (Admit) 80 bpm   Heart Rate (Exercise) 120 bpm   Heart Rate (Exit) 91 bpm   Rating of Perceived Exertion (Exercise) 14   Symptoms none   Comments Home Exercise Guidelines given 10/09/16   Duration Progress to 45 minutes of aerobic exercise without signs/symptoms of physical distress   Intensity THRR unchanged     Progression   Progression Continue to progress workloads to maintain intensity without signs/symptoms of physical distress.   Average METs 3.21     Resistance Training   Training Prescription Yes   Weight 3   Reps 10-12     Interval Training   Interval Training No     Treadmill   MPH 1.5  Grade 0.5   Minutes 15   METs 2.25     NuStep   Level 1   Minutes 15   METs 4.4     T5 Nustep   Level 4   Minutes 15   METs 3     Home Exercise Plan   Plans to continue exercise at Home   Frequency Add 1 additional day to program exercise sessions.  walking driveway, possibly join FF or Y with his wife      Nutrition:  Target Goals: Understanding of nutrition guidelines, daily intake of sodium <1515m, cholesterol <2065m calories 30% from fat and 7% or less from saturated fats, daily to have 5 or more servings of fruits and vegetables.  Biometrics:     Pre Biometrics - 09/11/16 1459      Pre Biometrics   Height _0  (1.778 m)   Weight 295 lb (133.8 kg)   Waist Circumference 51.25 inches   Hip Circumference 55.5 inches   Waist to Hip Ratio 0.92 %   BMI (Calculated) 42.4   Single Leg Stand 1.5 seconds       Nutrition Therapy Plan and Nutrition Goals:     Nutrition Therapy & Goals - 09/24/16 1642      Nutrition Therapy   Drug/Food Interactions Statins/Certain Fruits     Personal  Nutrition Goals   Comments GaKandon Hoskingsaid he has seen so many dieticians over the years and his blood sugars have been way up and way down some days. [facility]      Nutrition Discharge: Rate Your Plate Scores:   Nutrition Goals Re-Evaluation:   Psychosocial: Target Goals: Acknowledge presence or absence of depression, maximize coping skills, provide positive support system. Participant is able to verbalize types and ability to use techniques and skills needed for reducing stress and depression.  Initial Review & Psychosocial Screening:     Initial Psych Review & Screening - 09/24/16 1629      Initial Review   Current issues with Current Abuse or Neglect to Report      Quality of Life Scores:     Quality of Life - 09/11/16 1435      Quality of Life Scores   Health/Function Pre 25.25 %   Socioeconomic Pre 24.29 %   Psych/Spiritual Pre 28.29 %   Family Pre 30 %   GLOBAL Pre 26.41 %      PHQ-9: Recent Review Flowsheet Data    [facility] 09/11/2016 09/05/2016   Decreased Interest 0 0   Down, Depressed, Hopeless 1 0   PHQ - 2 Score 1 0   Altered sleeping 1 -   Tired, decreased energy 1 -   Change in appetite 0 -   Feeling bad or failure about yourself  1 -   Trouble concentrating 1 -   Moving slowly or fidgety/restless 1 -   Suicidal thoughts 0 -   PHQ-9 Score 6 -   Difficult doing work/chores Somewhat difficult -      Psychosocial Evaluation and Intervention:     Psychosocial Evaluation - 10/01/16 1717      Psychosocial Evaluation & Interventions   Comments Counselor met with Mr. AdKeastMr. A) today for initial psychosocial evaluation.  He is a [age]r old who had CABGx4 in August.  He has a strong support system with a spouse of [id number]; some in-laws who live close by and Mr. A Loni Muses involved in  his local church.  He has Type 1 Diabetes and took issue that his records indicate Type 2.  He has sleep apnea as well but  reports sleeping well with his CPAP.  His appetite is good.  He denies a history of depression or anxiety or current symptoms and states he is typically in a positive mood.  Mr. A reports minimal stress in his life as he has learned strategies to "let go."  He has goals to be healthier overall and is also looking into a program for he and his spouse to exercise together upon completion here.  Staff will continue to follow with Mr. A throughout the course of this program.        Psychosocial Re-Evaluation:     Psychosocial Re-Evaluation    [place] Name 09/24/16 1640             Psychosocial Re-Evaluation   Interventions Encouraged to attend Cardiac Rehabilitation for the exercise       Comments [place] "[name]" reports that his weight went back up to above 300 last year when his mother in law with dementia moved in with them. Mother in law was in a nursing home briefly but he reports she fell and had to go to the [place]. Dept. so she came back home with them.           Vocational Rehabilitation: Provide vocational rehab assistance to qualifying candidates.   Vocational Rehab Evaluation & Intervention:     Vocational Rehab - 09/11/16 1448      Initial Vocational Rehab Evaluation & Intervention   Assessment shows need for Vocational Rehabilitation No      Education: Education Goals: Education classes will be provided on a weekly basis, covering required topics. Participant will state understanding/return demonstration of topics presented.  Learning Barriers/Preferences:     Learning Barriers/Preferences - 09/11/16 1447      Learning Barriers/Preferences   Learning Barriers Sight   Learning Preferences None      Education Topics: General Nutrition Guidelines/Fats and Fiber: -Group instruction provided by verbal, written material, models and posters to present the general guidelines for heart healthy nutrition. Gives an explanation and review of dietary fats and  fiber.   Controlling Sodium/Reading Food Labels: -Group verbal and written material supporting the discussion of sodium use in heart healthy nutrition. Review and explanation with models, verbal and written materials for utilization of the food label. Flowsheet Row Cardiac Rehab from 10/22/2016 in [facility] Cardiac and Pulmonary Rehab  Date  09/22/16  Educator  PI  Instruction Review Code  2- meets goals/outcomes      Exercise Physiology & Risk Factors: - Group verbal and written instruction with models to review the exercise physiology of the cardiovascular system and associated critical values. Details cardiovascular disease risk factors and the goals associated with each risk factor.   Aerobic Exercise & Resistance Training: - Gives group verbal and written discussion on the health impact of inactivity. On the components of aerobic and resistive training programs and the benefits of this training and how to safely progress through these programs. Flowsheet Row Cardiac Rehab from 10/22/2016 in [facility] Cardiac and Pulmonary Rehab  Date  09/29/16  Educator  K. Amedeo Plenty  Instruction Review Code  2- meets goals/outcomes      Flexibility, Balance, General Exercise Guidelines: - Provides group verbal and written instruction on the benefits of flexibility and balance training programs. Provides general exercise guidelines with specific guidelines to those with heart or lung disease. Demonstration  and skill practice provided. Flowsheet Row Cardiac Rehab from 10/22/2016 in [facility] Cardiac and Pulmonary Rehab  Date  10/01/16  Educator  AS  Instruction Review Code  2- meets goals/outcomes      Stress Management: - Provides group verbal and written instruction about the health risks of elevated stress, cause of high stress, and healthy ways to reduce stress.   Depression: - Provides group verbal and written instruction on the correlation between heart/lung disease and depressed mood, treatment  options, and the stigmas associated with seeking treatment. Flowsheet Row Cardiac Rehab from 10/22/2016 in [facility] Cardiac and Pulmonary Rehab  Date  09/17/16  Educator  Berle Mull, MSW  Instruction Review Code  2- meets goals/outcomes      Anatomy & Physiology of the Heart: - Group verbal and written instruction and models provide basic cardiac anatomy and physiology, with the coronary electrical and arterial systems. Review of: AMI, Angina, Valve disease, Heart Failure, Cardiac Arrhythmia, Pacemakers, and the ICD. Flowsheet Row Cardiac Rehab from 10/22/2016 in [facility] Cardiac and Pulmonary Rehab  Date  10/06/16  Educator  SB  Instruction Review Code  2- meets goals/outcomes      Cardiac Procedures: - Group verbal and written instruction and models to describe the testing methods done to diagnose heart disease. Reviews the outcomes of the test results. Describes the treatment choices: Medical Management, Angioplasty, or Coronary Bypass Surgery.   Cardiac Medications: - Group verbal and written instruction to review commonly prescribed medications for heart disease. Reviews the medication, class of the drug, and side effects. Includes the steps to properly store meds and maintain the prescription regimen. Flowsheet Row Cardiac Rehab from 10/22/2016 in [facility] Cardiac and Pulmonary Rehab  Date  10/22/16 Marisue Humble 2]  Educator  SB  Instruction Review Code  2- meets goals/outcomes      Go Sex-Intimacy & Heart Disease, Get SMART - Goal Setting: - Group verbal and written instruction through game format to discuss heart disease and the return to sexual intimacy. Provides group verbal and written material to discuss and apply goal setting through the application of the S.M.A.R.T. Method.   Other Matters of the Heart: - Provides group verbal, written materials and models to describe Heart Failure, Angina, Valve Disease, and Diabetes in the realm of heart disease. Includes description of the disease  process and treatment options available to the cardiac patient. Flowsheet Row Cardiac Rehab from 10/22/2016 in [facility] Cardiac and Pulmonary Rehab  Date  10/06/16  Educator  SB  Instruction Review Code  2- meets goals/outcomes      Exercise & Equipment Safety: - Individual verbal instruction and demonstration of equipment use and safety with use of the equipment. Flowsheet Row Cardiac Rehab from 10/22/2016 in [facility] Cardiac and Pulmonary Rehab  Date  09/11/16  Educator  SB  Instruction Review Code  2- meets goals/outcomes      Infection Prevention: - Provides verbal and written material to individual with discussion of infection control including proper hand washing and proper equipment cleaning during exercise session. Flowsheet Row Cardiac Rehab from 10/22/2016 in [facility] Cardiac and Pulmonary Rehab  Date  09/11/16  Educator  SB  Instruction Review Code  2- meets goals/outcomes      Falls Prevention: - Provides verbal and written material to individual with discussion of falls prevention and safety. Flowsheet Row Cardiac Rehab from 10/22/2016 in [facility] Cardiac and Pulmonary Rehab  Date  09/11/16  Educator  SB  Instruction Review Code  2- meets goals/outcomes  Diabetes: - Individual verbal and written instruction to review signs/symptoms of diabetes, desired ranges of glucose level fasting, after meals and with exercise. Advice that pre and post exercise glucose checks will be done for 3 sessions at entry of program. Flowsheet Row Cardiac Rehab from 10/22/2016 in [facility] Cardiac and Pulmonary Rehab  Date  09/11/16  Educator  SB  Instruction Review Code  2- meets goals/outcomes       Knowledge Questionnaire Score:     Knowledge Questionnaire Score - 09/11/16 1447      Knowledge Questionnaire Score   Pre Score 20/28      Core Components/Risk Factors/Patient Goals at Admission:     Personal Goals and Risk Factors at Admission - 09/11/16 1432      Core Components/Risk  Factors/Patient Goals on Admission    Weight Management Obesity;Weight Maintenance;Yes   Admit Weight 295 lb (133.8 kg)   Goal Weight: Short Term 290 lb (131.5 kg)   Goal Weight: Long Term 220 lb (99.8 kg)   Expected Outcomes Short Term: Continue to assess and modify interventions until short term weight is achieved;Long Term: Adherence to nutrition and physical activity/exercise program aimed toward attainment of established weight goal   Sedentary Yes   Intervention Provide advice, education, support and counseling about physical activity/exercise needs.;Develop an individualized exercise prescription for aerobic and resistive training based on initial evaluation findings, risk stratification, comorbidities and participant's personal goals.   Expected Outcomes Achievement of increased cardiorespiratory fitness and enhanced flexibility, muscular endurance and strength shown through measurements of functional capacity and personal statement of participant.   Increase Strength and Stamina Yes   Intervention Provide advice, education, support and counseling about physical activity/exercise needs.;Develop an individualized exercise prescription for aerobic and resistive training based on initial evaluation findings, risk stratification, comorbidities and participant's personal goals.   Expected Outcomes Achievement of increased cardiorespiratory fitness and enhanced flexibility, muscular endurance and strength shown through measurements of functional capacity and personal statement of participant.   Diabetes Yes   Intervention Provide education about signs/symptoms and action to take for hypo/hyperglycemia.;Provide education about proper nutrition, including hydration, and aerobic/resistive exercise prescription along with prescribed medications to achieve blood glucose in normal ranges: Fasting glucose 65-99 mg/dL   Expected Outcomes Short Term: Participant verbalizes understanding of the signs/symptoms  and immediate care of hyper/hypoglycemia, proper foot care and importance of medication, aerobic/resistive exercise and nutrition plan for blood glucose control.;Long Term: Attainment of HbA1C < 7%.   Hypertension Yes   Intervention Provide education on lifestyle modifcations including regular physical activity/exercise, weight management, moderate sodium restriction and increased consumption of fresh fruit, vegetables, and low fat dairy, alcohol moderation, and smoking cessation.;Monitor prescription use compliance.   Expected Outcomes Short Term: Continued assessment and intervention until BP is < 140/38m HG in hypertensive participants. < 130/89mHG in hypertensive participants with diabetes, heart failure or chronic kidney disease.;Long Term: Maintenance of blood pressure at goal levels.   Lipids Yes  Statins cause muscle pain.   Crestor is new and so far doing well taking the Crestor   Intervention Provide education and support for participant on nutrition & aerobic/resistive exercise along with prescribed medications to achieve LDL <7050mHDL >42m35m Expected Outcomes Short Term: Participant states understanding of desired cholesterol values and is compliant with medications prescribed. Participant is following exercise prescription and nutrition guidelines.;Long Term: Cholesterol controlled with medications as prescribed, with individualized exercise RX and with personalized nutrition plan. Value goals: LDL < 70mg47mL > 40 mg.  Core Components/Risk Factors/Patient Goals Review:      Goals and Risk Factor Review    Row Name 09/24/16 1643 10/23/16 1103           Core Components/Risk Factors/Patient Goals Review   Personal Goals Review Weight Management/Obesity;Diabetes;Stress Weight Management/Obesity;Diabetes;Hypertension      Review Markis Langland' said his blood sugars have been so high at times but at 3pm today his blood sugar was 47 and he ate 3 small [place] candy bars  which usually helps him but when he arrive at Cardiac REhab at 4pm his initial finger stick blood sugar was 57. [place] ate 2 of our glucose tablets but said they dont help so given 2 Smartie candies.  [name] reports his BG has been between 104-213 measuring at home.  His BP has been steady.  He has met with RDs at the [facility] many times and doesn't feel he needs to meet with them here.  he feels the most important thing right now is regular exercise.  He was given info on the [employer] and we discussed the options for when he graduates.      Expected Outcomes Main goal is to get blood sugars in appropriate range. Decreased weight at some point. [name] will continue to develop regular exercise habits in [facility] and will maintain after he graduates.  Regular exercise will help him control his risk factors and reduce risk of another CV event.         Core Components/Risk Factors/Patient Goals at Discharge (Final Review):      Goals and Risk Factor Review - 10/23/16 1103      Core Components/Risk Factors/Patient Goals Review   Personal Goals Review Weight Management/Obesity;Diabetes;Hypertension   Review [name] reports his BG has been between 104-213 measuring at home.  His BP has been steady.  He has met with RDs at the [facility] many times and doesn't feel he needs to meet with them here.  he feels the most important thing right now is regular exercise.  He was given info on the [employer] and we discussed the options for when he graduates.   Expected Outcomes [name] will continue to develop regular exercise habits in [facility] and will maintain after he graduates.  Regular exercise will help him control his risk factors and reduce risk of another CV event.      ITP Comments:     ITP Comments    Row Name 09/11/16 1430 09/11/16 1450 09/24/16 1643 10/02/16 1758 10/06/16 1812   ITP Comments [facility] review completed today Initial ITP created. Documentation of Diagnosis can be found in [facility]   Surgery 06/27/2016 [facility] review completed today Initial ITP created. Documentation of Diagnosis can be found in [facility]  Surgery 06/24/2016 [facility]" said he has seen so many dieticians over the years and his blood sugars have been way up and way down some days. He sees an Endocrine MD at the [place].  [place] felt symptomatic for low BG and checked it himself without informing staff.  He was eating a candy bar and it was advised to stop by staff.  He was given glucose tabs, BG was 106 upon recheck and he was given crackers before leaving. [place] stsed he was experiencing pressure in his throat/chest / on pain scale. This occured while on the treadmill, he was slowed down and symptoms reduced/ He was stopped and the symptoms resolved.  He stated he was experiencing throat/sinus drainage today and felt stuffed up. He  was advised to call his PMd and his cardiololgist to report the various symptoms. He had experienced the throat discomfort over the weekend at times too.    [place] Name 10/08/16 3533 10/30/16 1655 11/04/16 0631       ITP Comments 30 day review completed for review by Dr Emily Filbert.  Continue with ITP unless changes noted by Dr Sabra Heck. "[name]" [place] said he has had a rough year this past year with carpel tunnel syndrom surgery and other medical problems. [name] said he didn't feel well today after exercising for about 10  minutes on the [place]. His blood sugar was 125 and his blood pressure was stable at 140/80.  30 day review. Continue with ITP unless directed changes per Medical Director review.        Comments:

## 2016-11-05 ENCOUNTER — Encounter: Payer: Medicare Other | Attending: Cardiothoracic Surgery | Admitting: *Deleted

## 2016-11-05 DIAGNOSIS — Z951 Presence of aortocoronary bypass graft: Secondary | ICD-10-CM | POA: Insufficient documentation

## 2016-11-05 LAB — GLUCOSE, CAPILLARY: Glucose-Capillary: 148 mg/dL — ABNORMAL HIGH (ref 65–99)

## 2016-11-05 NOTE — Progress Notes (Signed)
Daily Session Note  Patient Details  Name: Lawrence [name] MRN: [id number] Date of Birth: February 10, 1951 Referring Provider:   Flowsheet Row Cardiac Rehab from 09/11/2016 in [facility] Cardiac and Pulmonary Rehab  Referring Provider  Serafina Royals MD      Encounter Date: 11/05/2016  Check In:     Session Check In - 11/05/16 1639      Check-In   Location [facility]-Cardiac & Pulmonary Rehab   Staff Present Gerlene Burdock, RN, BSN;Patricia Surles RN Vickki Hearing, BA, ACSM CEP, Exercise Physiologist   Supervising physician immediately available to respond to emergencies See telemetry face sheet for immediately available ER MD   Medication changes reported     No   Fall or balance concerns reported    No   Warm-up and Cool-down Performed on first and last piece of equipment   Resistance Training Performed Yes   VAD Patient? No     Pain Assessment   Currently in Pain? No/denies         Goals Met:  Proper associated with RPD/PD & O2 Sat Exercise tolerated well  Goals Unmet:  Not Applicable  Comments:     Dr. Emily Filbert is Medical Director for [place] and [facility].

## 2016-11-07 ENCOUNTER — Ambulatory Visit (INDEPENDENT_AMBULATORY_CARE_PROVIDER_SITE_OTHER): Payer: Medicare Other | Admitting: Podiatry

## 2016-11-07 ENCOUNTER — Encounter: Payer: Self-pay | Admitting: Podiatry

## 2016-11-07 DIAGNOSIS — L97522 Non-pressure chronic ulcer of other part of left foot with fat layer exposed: Secondary | ICD-10-CM | POA: Diagnosis not present

## 2016-11-07 DIAGNOSIS — I70245 Atherosclerosis of native arteries of left leg with ulceration of other part of foot: Secondary | ICD-10-CM | POA: Diagnosis not present

## 2016-11-07 DIAGNOSIS — L97509 Non-pressure chronic ulcer of other part of unspecified foot with unspecified severity: Secondary | ICD-10-CM | POA: Diagnosis not present

## 2016-11-07 DIAGNOSIS — E0843 Diabetes mellitus due to underlying condition with diabetic autonomic (poly)neuropathy: Secondary | ICD-10-CM | POA: Diagnosis not present

## 2016-11-07 DIAGNOSIS — E1143 Type 2 diabetes mellitus with diabetic autonomic (poly)neuropathy: Secondary | ICD-10-CM

## 2016-11-07 DIAGNOSIS — E08621 Diabetes mellitus due to underlying condition with foot ulcer: Secondary | ICD-10-CM | POA: Diagnosis not present

## 2016-11-07 NOTE — Progress Notes (Signed)
Subjective:  Patient with a history of diabetes mellitus presents today for evaluation ulceration(s) to the lower extremitie(s). Patient states that he did have a history of diabetes mellitus. Patient presents today for his first application of Integra Omnigraft dermal regeneration matrix to the nonhealing ulceration of the second digit left foot.   Objective/Physical Exam General: The patient is alert and oriented x3 in no acute distress.  Dermatology:  Wound #1 noted to the distal tuft of the second digit left foot measuring [phone] cm (LxWxD).   To the noted ulceration(s), there is no eschar. There is a moderate amount of slough, fibrin, and necrotic tissue noted. Granulation tissue and wound base is red. There is a minimal amount of serosanguineous drainage noted. There is no exposed bone muscle-tendon ligament or joint. There is no malodor. Periwound integrity is intact. Skin is warm, dry and supple bilateral lower extremities.  Vascular: Palpable pedal pulses bilaterally. No edema or erythema noted. Capillary refill within normal limits.  Neurological: Epicritic and protective threshold absent bilaterally.   Musculoskeletal Exam: Range of motion within normal limits to all pedal and ankle joints bilateral. Muscle strength 5/5 in all groups bilateral.   Assessment: #1 ulceration second digit left foot secondary to diabetes mellitus #2 diabetes mellitus w/ peripheral neuropathy   Plan of Care:  #1 Patient was evaluated. #2 wound was cleansed with normal saline  #3 application of 123XX123 cm of Integra Omnigraft dermal regeneration matrix applied to the ulceration site. The graft was then fixated with Steri-Strips. Please note this graft is medically necessary. The patient's had the wound for greater than 6 weeks and has failed all conservative modalities of treatment in satisfactory alleviation of symptoms for the patient. #4 dry sterile dressing applied to the patient's left  foot #5 instructions to keep the graft clean dry and intact for 2 weeks #6 return to clinic in 2 weeks  Integra Omnigraft LOT# [id number]  REF# [id number]    [name], DPM [facility]  Dr. [name], [place]                                        [place], [place] [address]                Office [phone]  Fax [phone]

## 2016-11-11 DIAGNOSIS — E782 Mixed hyperlipidemia: Secondary | ICD-10-CM | POA: Diagnosis not present

## 2016-11-11 DIAGNOSIS — I10 Essential (primary) hypertension: Secondary | ICD-10-CM | POA: Diagnosis not present

## 2016-11-11 DIAGNOSIS — L97521 Non-pressure chronic ulcer of other part of left foot limited to breakdown of skin: Secondary | ICD-10-CM | POA: Diagnosis not present

## 2016-11-11 DIAGNOSIS — I25810 Atherosclerosis of coronary artery bypass graft(s) without angina pectoris: Secondary | ICD-10-CM | POA: Diagnosis not present

## 2016-11-21 ENCOUNTER — Encounter: Payer: Self-pay | Admitting: Podiatry

## 2016-11-21 ENCOUNTER — Ambulatory Visit (INDEPENDENT_AMBULATORY_CARE_PROVIDER_SITE_OTHER): Payer: Medicare Other | Admitting: Podiatry

## 2016-11-21 DIAGNOSIS — E08621 Diabetes mellitus due to underlying condition with foot ulcer: Secondary | ICD-10-CM

## 2016-11-21 DIAGNOSIS — E0843 Diabetes mellitus due to underlying condition with diabetic autonomic (poly)neuropathy: Secondary | ICD-10-CM

## 2016-11-21 DIAGNOSIS — L97509 Non-pressure chronic ulcer of other part of unspecified foot with unspecified severity: Secondary | ICD-10-CM

## 2016-11-21 DIAGNOSIS — L97522 Non-pressure chronic ulcer of other part of left foot with fat layer exposed: Secondary | ICD-10-CM

## 2016-11-21 DIAGNOSIS — I70245 Atherosclerosis of native arteries of left leg with ulceration of other part of foot: Secondary | ICD-10-CM

## 2016-11-21 DIAGNOSIS — E1143 Type 2 diabetes mellitus with diabetic autonomic (poly)neuropathy: Secondary | ICD-10-CM

## 2016-11-24 ENCOUNTER — Encounter: Payer: Medicare Other | Admitting: *Deleted

## 2016-11-24 ENCOUNTER — Telehealth: Payer: Self-pay | Admitting: *Deleted

## 2016-11-24 DIAGNOSIS — Z951 Presence of aortocoronary bypass graft: Secondary | ICD-10-CM

## 2016-11-24 LAB — GLUCOSE, CAPILLARY: Glucose-Capillary: 206 mg/dL — ABNORMAL HIGH (ref 65–99)

## 2016-11-24 NOTE — Progress Notes (Signed)
Daily Session Note  Patient Details  Name: Lawrence [name] MRN: [id number] Date of Birth: 1951/04/15 Referring Provider:   Flowsheet Row Cardiac Rehab from 09/11/2016 in [facility] Cardiac and Pulmonary Rehab  Referring Provider  Serafina Royals MD      Encounter Date: 11/24/2016  Check In:     Session Check In - 11/24/16 1623      Check-In   Staff Present Gerlene Burdock, RN, BSN;Mianna Iezzi, RN, BSN, Laveda Norman, BS, ACSM CEP, Exercise Physiologist   Supervising physician immediately available to respond to emergencies See telemetry face sheet for immediately available ER MD   Medication changes reported     No   Fall or balance concerns reported    No   Warm-up and Cool-down Performed on first and last piece of equipment   Resistance Training Performed Yes   VAD Patient? No         Goals Met:  Exercise tolerated well No report of cardiac concerns or symptoms Strength training completed today  Goals Unmet:  Not Applicable  Comments: Doing well with exercise prescription progression.    Dr. Emily Filbert is Medical Director for [place] and [facility].

## 2016-11-26 ENCOUNTER — Encounter: Payer: Medicare Other | Admitting: *Deleted

## 2016-11-26 DIAGNOSIS — Z951 Presence of aortocoronary bypass graft: Secondary | ICD-10-CM | POA: Diagnosis not present

## 2016-11-26 LAB — GLUCOSE, CAPILLARY
Glucose-Capillary: 165 mg/dL — ABNORMAL HIGH (ref 65–99)
Glucose-Capillary: 153 mg/dL — ABNORMAL HIGH (ref 65–99)

## 2016-11-26 NOTE — Progress Notes (Signed)
Daily Session Note  Patient Details  Name: Lawrence [name] MRN: [id number] Date of Birth: 05/24/1951 Referring Provider:   Flowsheet Row Cardiac Rehab from 09/11/2016 in [facility] Cardiac and Pulmonary Rehab  Referring Provider  Lawrence Royals MD      Encounter Date: 11/26/2016  Check In:     Session Check In - 11/26/16 1633      Check-In   Location [facility]-Cardiac & Pulmonary Rehab   Staff Present Lawrence Burdock, RN, Lawrence [name], BA, ACSM CEP, Exercise Physiologist;Lawrence Surles RN BSN   Supervising physician immediately available to respond to emergencies See telemetry face sheet for immediately available ER MD   Medication changes reported     No   Fall or balance concerns reported    No   Warm-up and Cool-[name] Performed on first and last piece of equipment   Resistance Training Performed Yes   VAD Patient? No     Pain Assessment   Currently in Pain? No/denies         Goals Met:  Proper associated with RPD/PD & O2 Sat Exercise tolerated well  Goals Unmet:  Not Applicable  Comments:     Dr. Emily [name] is Medical Director for [place] and [facility].

## 2016-11-28 ENCOUNTER — Ambulatory Visit (INDEPENDENT_AMBULATORY_CARE_PROVIDER_SITE_OTHER): Payer: Medicare Other | Admitting: Podiatry

## 2016-11-28 ENCOUNTER — Encounter: Payer: Self-pay | Admitting: Podiatry

## 2016-11-28 DIAGNOSIS — E0843 Diabetes mellitus due to underlying condition with diabetic autonomic (poly)neuropathy: Secondary | ICD-10-CM

## 2016-11-28 DIAGNOSIS — I70245 Atherosclerosis of native arteries of left leg with ulceration of other part of foot: Secondary | ICD-10-CM

## 2016-11-28 DIAGNOSIS — E1143 Type 2 diabetes mellitus with diabetic autonomic (poly)neuropathy: Secondary | ICD-10-CM | POA: Diagnosis not present

## 2016-11-28 DIAGNOSIS — L97509 Non-pressure chronic ulcer of other part of unspecified foot with unspecified severity: Secondary | ICD-10-CM

## 2016-11-28 DIAGNOSIS — E08621 Diabetes mellitus due to underlying condition with foot ulcer: Secondary | ICD-10-CM

## 2016-11-28 DIAGNOSIS — L97522 Non-pressure chronic ulcer of other part of left foot with fat layer exposed: Secondary | ICD-10-CM | POA: Diagnosis not present

## 2016-11-28 MED ORDER — SULFAMETHOXAZOLE-TRIMETHOPRIM 800-160 MG PO TABS: 1.0000 | ORAL_TABLET | Freq: Two times a day (BID) | ORAL | 0 refills | Status: DC

## 2016-11-28 MED ORDER — MUPIROCIN 2 % EX OINT
1.0000 | TOPICAL_OINTMENT | Freq: Two times a day (BID) | CUTANEOUS | 0 refills | Status: DC
Start: 2016-11-28 — End: 2017-01-16

## 2016-11-28 NOTE — Telephone Encounter (Signed)
[employer] rep called stated that she got called into an emergency burn and would not be able to make it and left her number to call if he had any questions [phone]

## 2016-11-28 NOTE — Telephone Encounter (Signed)
Phillips Climes - [employer] states she will stop by to see Dr. Amalia Hailey at pt's Omnigraft application appt. 0000000. Warner Mccreedy - [employer] will discuss the necessity of ordering Omnigraft at a certain stage of pt's healing.

## 2016-11-28 NOTE — Telephone Encounter (Signed)
Patient's wound was mildly infected today so we didn't apply the wound graft. He will return in 2 weeks for follow-up and possibly apply the [place] then.  Thanks, Dr. Amalia Hailey

## 2016-11-30 NOTE — Progress Notes (Signed)
   Subjective:  Patient with a history of diabetes mellitus presents today for evaluation ulceration(s) to the lower extremitie(s). Patient states that he did have a history of diabetes mellitus. Patient presents today for further treatment and evaluation   Objective/Physical Exam General: The patient is alert and oriented x3 in no acute distress.  Dermatology:  Wound #1 noted to the distal tuft of the second digit left foot  measuring [phone] cm (LxWxD).   To the noted ulceration(s), there is no eschar. There is a moderate amount of slough, fibrin, and necrotic tissue noted. Granulation tissue and wound base is red. There is a minimal amount of serosanguineous drainage noted. There is no exposed bone muscle-tendon ligament or joint. There is no malodor. Periwound integrity is intact. Skin is warm, dry and supple bilateral lower extremities.  Vascular: Palpable pedal pulses bilaterally. No edema or erythema noted. Capillary refill within normal limits.  Neurological: Epicritic and protective threshold absent bilaterally.   Musculoskeletal Exam: Range of motion within normal limits to all pedal and ankle joints bilateral. Muscle strength 5/5 in all groups bilateral.   Assessment: #1 ulceration second digit left foot secondary to diabetes mellitus #2 diabetes mellitus w/ peripheral neuropathy   Plan of Care:  #1 Patient was evaluated. #2 medically necessary excisional debridement including subcutaneous tissue was performed using a tissue nipper and a chisel blade. Excisional debridement of all the necrotic nonviable tissue down to healthy bleeding viable tissue was performed with post-debridement measurements same as pre-. #3 the wound was cleansed and dry sterile dressing applied. #4 return to clinic in 1 week for graft application  [name], DPM [facility]  Dr. [name], [address]                                        [place],  [place] [address]                Office [phone]  Fax [phone]

## 2016-12-01 ENCOUNTER — Encounter: Payer: Medicare Other | Admitting: *Deleted

## 2016-12-01 DIAGNOSIS — Z951 Presence of aortocoronary bypass graft: Secondary | ICD-10-CM | POA: Diagnosis not present

## 2016-12-01 DIAGNOSIS — L97521 Non-pressure chronic ulcer of other part of left foot limited to breakdown of skin: Secondary | ICD-10-CM | POA: Diagnosis not present

## 2016-12-01 LAB — WOUND CULTURE: Organism ID, Bacteria: NONE SEEN

## 2016-12-01 NOTE — Progress Notes (Signed)
Daily Session Note  Patient Details  Name: RAISTLIN GUM MRN: [id number] Date of Birth: 08-07-1951 Referring Provider:   Flowsheet Row Cardiac Rehab from 09/11/2016 in [facility] Cardiac and Pulmonary Rehab  Referring Provider  Serafina Royals MD      Encounter Date: 12/01/2016  Check In:     Session Check In - 12/01/16 1624      Check-In   Staff Present Gerlene Burdock, RN, BSN;Oval Cavazos, RN, BSN, Laveda Norman, BS, ACSM CEP, Exercise Physiologist   Supervising physician immediately available to respond to emergencies See telemetry face sheet for immediately available ER MD   Medication changes reported     No   Fall or balance concerns reported    No   Warm-up and Cool-down Performed on first and last piece of equipment   Resistance Training Performed Yes   VAD Patient? No     Pain Assessment   Currently in Pain? No/denies         Goals Met:  Personal goals reviewed Strength training completed today  Goals Unmet:  some symptoms during exercise, resolved with rest  Comments: See ITP Comments for note about symptoms   Dr. Emily Filbert is Medical Director for [place] and [facility].

## 2016-12-03 ENCOUNTER — Encounter: Payer: Self-pay | Admitting: *Deleted

## 2016-12-03 DIAGNOSIS — Z951 Presence of aortocoronary bypass graft: Secondary | ICD-10-CM

## 2016-12-03 LAB — GLUCOSE, CAPILLARY: Glucose-Capillary: 173 mg/dL — ABNORMAL HIGH (ref 65–99)

## 2016-12-03 NOTE — Progress Notes (Signed)
Daily Session Note  Patient Details  Name: Lawrence [name] MRN: [id number] Date of Birth: 06/27/1951 Referring Provider:   Flowsheet Row Cardiac Rehab from 09/11/2016 in [facility] Cardiac and Pulmonary Rehab  Referring Provider  Kowalski, Bruce MD      Encounter Date: 12/03/2016  Check In:     Session Check In - 12/03/16 1706      Check-In   Location [facility]-Cardiac & Pulmonary Rehab   Staff Present Patricia Surles RN BSN;Carroll Enterkin, RN, BSN;Amanda Sommer, BA, ACSM CEP, Exercise Physiologist   Supervising physician immediately available to respond to emergencies See telemetry face sheet for immediately available ER MD   Medication changes reported     No   Fall or balance concerns reported    No   Warm-up and Cool-down Performed on first and last piece of equipment   Resistance Training Performed Yes   VAD Patient? No     Pain Assessment   Currently in Pain? No/denies   Multiple Pain Sites No         Goals Met:  [place] with exercise equipment Exercise tolerated well No report of cardiac concerns or symptoms Strength training completed today  Goals Unmet:  Not Applicable  Comments:  [name] felt dizzy during exercise.  BG and BP were normal so he completed the session with no further issues.   Dr. Mark Miller is Medical Director for [facility] and [facility]. 

## 2016-12-03 NOTE — Progress Notes (Signed)
Cardiac Individual Treatment Plan  Patient Details  Name: Lawrence [name] MRN: [id number] Date of Birth: 1951/08/09 Referring Provider:   Flowsheet Row Cardiac Rehab from 09/11/2016 in [facility] Cardiac and Pulmonary Rehab  Referring Provider  Serafina Royals MD      Initial Encounter Date:  Flowsheet Row Cardiac Rehab from 09/11/2016 in [facility] Cardiac and Pulmonary Rehab  Date  09/11/16  Referring Provider  Serafina Royals MD      Visit Diagnosis: S/P CABG x 4  Patient's Home Medications on Admission:  Current Outpatient Prescriptions:  .  aspirin 81 MG EC tablet, Take 81 mg by mouth daily. Swallow whole., Disp: , Rfl:  .  cholecalciferol (VITAMIN D) 1000 units tablet, Take 1,000 Units by mouth daily., Disp: , Rfl:  .  docusate sodium (COLACE) 100 MG capsule, Take 200 mg by mouth 2 (two) times daily., Disp: , Rfl:  .  furosemide (LASIX) 40 MG tablet, Take 1 tablet (40 mg total) by mouth daily., Disp: 30 tablet, Rfl: 1 .  glucagon (GLUCAGON EMERGENCY) 1 MG injection, Inject 1 mg into the vein once as needed. Reported on 02/11/2016, Disp: , Rfl:  .  insulin aspart (NOVOLOG) 100 UNIT/ML injection, Inject 20 Units into the skin 3 (three) times daily. (Patient taking differently: Inject 20 Units into the skin 3 (three) times daily with meals. ), Disp: 10 mL, Rfl: 5 .  insulin glargine (LANTUS) 100 UNIT/ML injection, Inject 20 Units into the skin at bedtime. , Disp: , Rfl:  .  latanoprost (XALATAN) 0.005 % ophthalmic solution, Place 1 drop into both eyes at bedtime., Disp: , Rfl:  .  loratadine (CLARITIN) 10 MG tablet, Take 10 mg by mouth daily., Disp: , Rfl:  .  metoprolol tartrate (LOPRESSOR) 25 MG tablet, Take 25 mg by mouth 2 (two) times daily., Disp: , Rfl:  .  Multiple Vitamin (MULTIVITAMIN) capsule, Take 1 capsule by mouth daily., Disp: , Rfl:  .  mupirocin ointment (BACTROBAN) 2 %, Apply 1 application topically 2 (two) times daily., Disp: 22 g, Rfl: 0 .  potassium chloride SA  (K-DUR,KLOR-CON) 20 MEQ tablet, Take 1 tablet (20 mEq total) by mouth daily., Disp: 30 tablet, Rfl: 1 .  rosuvastatin (CRESTOR) 20 MG tablet, Take 1 tablet (20 mg total) by mouth daily at 6 PM., Disp: 30 tablet, Rfl: 1 .  sulfamethoxazole-trimethoprim (BACTRIM DS,SEPTRA DS) 800-160 MG tablet, Take 1 tablet by mouth 2 (two) times daily., Disp: 20 tablet, Rfl: 0 .  vitamin C (ASCORBIC ACID) 500 MG tablet, Take 500 mg by mouth 2 (two) times daily. , Disp: , Rfl:   Past Medical History: Past Medical History:  Diagnosis Date  . CAD (coronary artery disease)    07/2007  . Diabetes ([place])   . Elevated lipids   . Glaucoma   . HBP (high [name] pressure)   . Neuropathy ([place])   . Osteoarthritis   . Skin cancer   . Sleep apnea    CPAP    Tobacco Use: History  Smoking Status  . Former Smoker  . Packs/day: 1.00  . Years: 16.00  . Types: Cigarettes  . Start date: 11/03/1969  . Quit date: 07/22/1986  Smokeless Tobacco  . Never Used    Labs: Recent Review Flowsheet Data    Labs for ITP Cardiac and Pulmonary Rehab Latest Ref Rng & Units 06/27/2016 06/27/2016 06/27/2016 06/28/2016 09/08/2016   Cholestrol 0 - 200 mg/dL - - - - 164   LDLCALC 0 - 99 mg/dL - - - -  69   HDL >39.00 mg/dL - - - - 81.00   Trlycerides 0.0 - 149.0 mg/dL - - - - 69.0   Hemoglobin A1c 4.6 - 6.5 % - - - - 7.9(H)   PHART 7.350 - 7.450 7.372 - 7.390 - -   PCO2ART 35.0 - 45.0 mmHg 46.1(H) - 45.2(H) - -   HCO3 20.0 - 24.0 mEq/L 26.8(H) - 27.3(H) - -   TCO2 0 - 100 mmol/L _0 -   O2SAT % 97.0 - 96.0 - -       Exercise Target Goals:    Exercise Program Goal: Individual exercise prescription set with THRR, safety & activity barriers. Participant demonstrates ability to understand and report RPE using BORG scale, to self-measure pulse accurately, and to acknowledge the importance of the exercise prescription.  Exercise Prescription Goal: Starting with aerobic activity 30 plus minutes a day, 3 days per week for  initial exercise prescription. Provide home exercise prescription and guidelines that participant acknowledges understanding prior to discharge.  Activity Barriers & Risk Stratification:     Activity Barriers & Cardiac Risk Stratification - 09/11/16 1446      Activity Barriers & Cardiac Risk Stratification   Activity Barriers Left Knee Replacement;Arthritis;Muscular Weakness;Deconditioning;Assistive Device;History of Falls;Balance Concerns;Other (comment)   Comments neuropathy in both legs and feet   Cardiac Risk Stratification High      6 Minute Walk:     6 Minute Walk    Row Name 09/11/16 1455         6 Minute Walk   Phase Initial     Distance 1084 feet     Walk Time 6 minutes     # of Rest Breaks 0     MPH 2.05     METS 2.57     RPE 13     VO2 Peak 6.43     Symptoms No     Resting HR 72 bpm     Resting BP 126/64     Max Ex. HR 102 bpm     Max Ex. BP 134/84     2 Minute Post BP 126/62        Initial Exercise Prescription:     Initial Exercise Prescription - 09/11/16 1400      Date of Initial Exercise RX and Referring Provider   Date 09/11/16   Referring Provider Serafina Royals MD     Treadmill   MPH 1.5   Grade 0.5   Minutes 15   METs 2.25     NuStep   Level 1   Watts --  80-100 spm   Minutes 15   METs 2     T5 Nustep   Level 1   Watts --  80-100 spm   Minutes 15   METs 2     Prescription Details   Frequency (times per week) 3   Duration Progress to 45 minutes of aerobic exercise without signs/symptoms of physical distress     Intensity   THRR 40-80% of Max Heartrate 108-135   Ratings of Perceived Exertion 11-15   Perceived Dyspnea 0-4     Progression   Progression Continue to progress workloads to maintain intensity without signs/symptoms of physical distress.     Resistance Training   Training Prescription Yes   Weight 3 lbs   Reps 10-12      Perform Capillary [name] Glucose checks as needed.  Exercise Prescription  Changes:     Exercise Prescription Changes  Row Name 09/11/16 1400 09/18/16 1100 10/01/16 1300 10/09/16 1000 10/16/16 1000     Exercise Review   Progression -  walk test results  - Yes  - Yes     Response to Exercise   [name] Pressure (Admit) 126/64 146/42 142/70  - 126/68   [name] Pressure (Exercise) 134/84 158/80 148/72  - 140/64   [name] Pressure (Exit) 126/62 132/80 148/60  - 136/64   Heart Rate (Admit) 76 bpm  - 81 bpm  - 94 bpm   Heart Rate (Exercise) 104 bpm 96 bpm 113 bpm  - 105 bpm   Heart Rate (Exit) 88 bpm 80 bpm 87 bpm  - 86 bpm   Rating of Perceived Exertion (Exercise) _0 - 14   Symptoms none none  -  - none   Duration  -  - Progress to 45 minutes of aerobic exercise without signs/symptoms of physical distress  - Progress to 45 minutes of aerobic exercise without signs/symptoms of physical distress   Intensity  -  - THRR unchanged  - THRR unchanged     Progression   Progression  -  - Continue to progress workloads to maintain intensity without signs/symptoms of physical distress.  - Continue to progress workloads to maintain intensity without signs/symptoms of physical distress.   Average METs  - 1.8 2.2  - 2.25     Resistance Training   Training Prescription  - Yes Yes  - Yes   Weight  - 3 3  - 3   Reps  - 10-12 10-12  - 10-12     Interval Training   Interval Training  - No No  - No     Treadmill   MPH  -  - 1.5  -  -   Grade  -  - 0.5  -  -   Minutes  -  - 15  -  -   METs  -  - 2.25  -  -     NuStep   Level  - 1 1  - 1   Minutes  - 15 15  - 15   METs  - 1.7 2.1  - 2.4     T5 Nustep   Level  - 1  -  - 1   Minutes  - 15  -  - 15   METs  - 1.9  -  - 2.1     Home Exercise Plan   Plans to continue exercise at  -  -  - Home  -   Frequency  -  -  - Add 1 additional day to program exercise sessions.  walking driveway, possibly join FF or Y with his wife  -   Row Name 10/31/16 0900 11/13/16 1200 11/27/16 1100         Exercise Review   Progression  Yes  -  -       Response to Exercise   [name] Pressure (Admit) 132/64 122/70 124/66     [name] Pressure (Exercise) 148/80 128/70 142/70     [name] Pressure (Exit) 138/70 120/80 148/58     Heart Rate (Admit) 80 bpm 92 bpm 72 bpm     Heart Rate (Exercise) 120 bpm 113 bpm 107 bpm     Heart Rate (Exit) 91 bpm 80 bpm 83 bpm     Rating of Perceived Exertion (Exercise) _1 Symptoms none none none     Comments Home  Exercise Guidelines given 10/09/16  -  -     Duration Progress to 45 minutes of aerobic exercise without signs/symptoms of physical distress Progress to 45 minutes of aerobic exercise without signs/symptoms of physical distress Progress to 45 minutes of aerobic exercise without signs/symptoms of physical distress     Intensity THRR unchanged THRR unchanged THRR unchanged       Progression   Progression Continue to progress workloads to maintain intensity without signs/symptoms of physical distress. Continue to progress workloads to maintain intensity without signs/symptoms of physical distress. Continue to progress workloads to maintain intensity without signs/symptoms of physical distress.     Average METs 3.21  -  -       Resistance Training   Training Prescription Yes Yes Yes     Weight _0 Reps 10-12 10-12 10-12       Interval Training   Interval Training No No No       Treadmill   MPH 1.5  - 2     Grade 0.5  - 1     Minutes 15  - 15     METs 2.25  - 2.81       NuStep   Level 1 1  -     Minutes _1 METs 4.4 2.4 3.8       T5 Nustep   Level 4 4  -     Minutes 15 15  -     METs 3  -  -       Home Exercise Plan   Plans to continue exercise at Home  -  -     Frequency Add 1 additional day to program exercise sessions.  walking driveway, possibly join FF or Y with his wife  -  -        Exercise Comments:     Exercise Comments    Row Name 09/11/16 1458 09/18/16 1109 09/18/16 1721 10/01/16 1341 10/09/16 1713   Exercise Comments Lawrence [name] wants to  be able to play with his great granddaughter. Lawrence [name] first day of exercise went well.   [place] felt like his BG was low during exercise.  It was 117 and he continued the session and will recheck BG before leaving today. Lawrence [name] is working on keeping his BG within limits for pre and post exercise. Lawrence [name] asked for a BG check after cardio - it was 243 and BP was 134/68.  He has had a sinus infection and has a headache.  He said he felt ok to do stretching at end of session.   [place] Name 10/16/16 1036 10/23/16 1736 10/31/16 0950 11/13/16 1233 11/27/16 1107   Exercise Comments Lawrence [name] is progressing well with exercise. [place] felt BG was low after 13 min. on TM.  It was 79.  He was given glucose gel and had crackers and it was 109 15 min later. Lawrence [name] has continued to do well in rehab.  We will continue to monitor his progression. Lawrence [name] is progressing well with exercise. Lawrence [name] is doing well with exercise.  Staff still monitors BG before and after as it has not been consistent.      Discharge Exercise Prescription (Final Exercise Prescription Changes):     Exercise Prescription Changes - 11/27/16 1100      Response to Exercise   [name] Pressure (Admit) 124/66   [name] Pressure (Exercise) 142/70   [name] Pressure (Exit) 148/58  Heart Rate (Admit) 72 bpm   Heart Rate (Exercise) 107 bpm   Heart Rate (Exit) 83 bpm   Rating of Perceived Exertion (Exercise) 13   Symptoms none   Duration Progress to 45 minutes of aerobic exercise without signs/symptoms of physical distress   Intensity THRR unchanged     Progression   Progression Continue to progress workloads to maintain intensity without signs/symptoms of physical distress.     Resistance Training   Training Prescription Yes   Weight 3   Reps 10-12     Interval Training   Interval Training No     Treadmill   MPH 2   Grade 1   Minutes 15   METs 2.81     NuStep   Minutes 15   METs 3.8      Nutrition:  Target Goals: Understanding of  nutrition guidelines, daily intake of sodium <1575m, cholesterol <2061m calories 30% from fat and 7% or less from saturated fats, daily to have 5 or more servings of fruits and vegetables.  Biometrics:     Pre Biometrics - 09/11/16 1459      Pre Biometrics   Height 5' 10" (1.778 m)   Weight 295 lb (133.8 kg)   Waist Circumference 51.25 inches   Hip Circumference 55.5 inches   Waist to Hip Ratio 0.92 %   BMI (Calculated) 42.4   Single Leg Stand 1.5 seconds       Nutrition Therapy Plan and Nutrition Goals:     Nutrition Therapy & Goals - 12/01/16 1658      Nutrition Therapy   Diet Deferred RD appointment      Nutrition Discharge: Rate Your Plate Scores:   Nutrition Goals Re-Evaluation:   Psychosocial: Target Goals: Acknowledge presence or absence of depression, maximize coping skills, provide positive support system. Participant is able to verbalize types and ability to use techniques and skills needed for reducing stress and depression.  Initial Review & Psychosocial Screening:     Initial Psych Review & Screening - 09/24/16 1629      Initial Review   Current issues with Current Abuse or Neglect to Report      Quality of Life Scores:     Quality of Life - 09/11/16 1435      Quality of Life Scores   Health/Function Pre 25.25 %   Socioeconomic Pre 24.29 %   Psych/Spiritual Pre 28.29 %   Family Pre 30 %   GLOBAL Pre 26.41 %      PHQ-9: Recent Review Flowsheet Data    [facility] 09/11/2016 09/05/2016   Decreased Interest 0 0   Down, Depressed, Hopeless 1 0   PHQ - 2 Score 1 0   Altered sleeping 1 -   Tired, decreased energy 1 -   Change in appetite 0 -   Feeling bad or failure about yourself  1 -   Trouble concentrating 1 -   Moving slowly or fidgety/restless 1 -   Suicidal thoughts 0 -   PHQ-9 Score 6 -   Difficult doing work/chores Somewhat difficult -      Psychosocial Evaluation and Intervention:     Psychosocial  Evaluation - 10/01/16 1717      Psychosocial Evaluation & Interventions   Comments Counselor met with Lawrence. AdThiviergeMr. A) today for initial psychosocial evaluation.  He is a [age]r old who had CABGx4 in August.  He has a strong support system with a spouse of 3072ears; some in-laws  who live close by and Lawrence. Loni Muse is involved in his local church.  He has Type 1 Diabetes and took issue that his records indicate Type 2.  He has sleep apnea as well but reports sleeping well with his CPAP.  His appetite is good.  He denies a history of depression or anxiety or current symptoms and states he is typically in a positive mood.  Lawrence. A reports minimal stress in his life as he has learned strategies to "let go."  He has goals to be healthier overall and is also looking into a program for he and his spouse to exercise together upon completion here.  Staff will continue to follow with Lawrence. A throughout the course of this program.        Psychosocial Re-Evaluation:     Psychosocial Re-Evaluation    Row Name 09/24/16 1640 11/27/16 1356 12/01/16 1659         Psychosocial Re-Evaluation   Interventions Encouraged to attend Cardiac Rehabilitation for the exercise  - Encouraged to attend Cardiac Rehabilitation for the exercise     Comments Lawrence "Lawrence [name]" reports that his weight went back up to above 300 last year when his mother in law with dementia moved in with them. Mother in law was in a nursing home briefly but he reports she fell and had to go to the [facility]. so she came back home with them.  Lawrence [name] was sorry he was unable to attend Cardiac Rehab for 2 weeks due to having a skin graft on his tow.  Lawrence [name] continues to keep stress levels low by using coping skills to release stress if it occurs.   Expected outcome is that Lawrence [name] will continue to use his coping skills to keep any stress under control.     Continued Psychosocial Services Needed  -  - No        Vocational Rehabilitation: Provide vocational rehab  assistance to qualifying candidates.   Vocational Rehab Evaluation & Intervention:     Vocational Rehab - 09/11/16 1448      Initial Vocational Rehab Evaluation & Intervention   Assessment shows need for Vocational Rehabilitation No      Education: Education Goals: Education classes will be provided on a weekly basis, covering required topics. Participant will state understanding/return demonstration of topics presented.  Learning Barriers/Preferences:     Learning Barriers/Preferences - 09/11/16 1447      Learning Barriers/Preferences   Learning Barriers Sight   Learning Preferences None      Education Topics: General Nutrition Guidelines/Fats and Fiber: -Group instruction provided by verbal, written material, models and posters to present the general guidelines for heart healthy nutrition. Gives an explanation and review of dietary fats and fiber.   Controlling Sodium/Reading Food Labels: -Group verbal and written material supporting the discussion of sodium use in heart healthy nutrition. Review and explanation with models, verbal and written materials for utilization of the food label. Flowsheet Row Cardiac Rehab from 12/01/2016 in [facility] Cardiac and Pulmonary Rehab  Date  09/22/16  Educator  PI  Instruction Review Code  2- meets goals/outcomes      Exercise Physiology & Risk Factors: - Group verbal and written instruction with models to review the exercise physiology of the cardiovascular system and associated critical values. Details cardiovascular disease risk factors and the goals associated with each risk factor. Flowsheet Row Cardiac Rehab from 12/01/2016 in [facility] Cardiac and Pulmonary Rehab  Date  11/24/16  Educator  [facility]  Instruction Review Code  2- meets goals/outcomes      Aerobic Exercise & Resistance Training: - Gives group verbal and written discussion on the health impact of inactivity. On the components of aerobic and resistive training programs and the  benefits of this training and how to safely progress through these programs. Flowsheet Row Cardiac Rehab from 12/01/2016 in [facility] Cardiac and Pulmonary Rehab  Date  11/26/16  Educator  Rema Fendt.  Instruction Review Code  2- meets goals/outcomes      Flexibility, Balance, General Exercise Guidelines: - Provides group verbal and written instruction on the benefits of flexibility and balance training programs. Provides general exercise guidelines with specific guidelines to those with heart or lung disease. Demonstration and skill practice provided. Flowsheet Row Cardiac Rehab from 12/01/2016 in [facility] Cardiac and Pulmonary Rehab  Date  12/01/16  Educator  [facility]  Instruction Review Code  2- meets goals/outcomes      Stress Management: - Provides group verbal and written instruction about the health risks of elevated stress, cause of high stress, and healthy ways to reduce stress.   Depression: - Provides group verbal and written instruction on the correlation between heart/lung disease and depressed mood, treatment options, and the stigmas associated with seeking treatment. Flowsheet Row Cardiac Rehab from 12/01/2016 in [facility] Cardiac and Pulmonary Rehab  Date  09/17/16  Educator  Berle Mull, MSW  Instruction Review Code  2- meets goals/outcomes      Anatomy & Physiology of the Heart: - Group verbal and written instruction and models provide basic cardiac anatomy and physiology, with the coronary electrical and arterial systems. Review of: AMI, Angina, Valve disease, Heart Failure, Cardiac Arrhythmia, Pacemakers, and the ICD. Flowsheet Row Cardiac Rehab from 12/01/2016 in [facility] Cardiac and Pulmonary Rehab  Date  10/06/16  Educator  SB  Instruction Review Code  2- meets goals/outcomes      Cardiac Procedures: - Group verbal and written instruction and models to describe the testing methods done to diagnose heart disease. Reviews the outcomes of the test results. Describes the treatment  choices: Medical Management, Angioplasty, or Coronary Bypass Surgery.   Cardiac Medications: - Group verbal and written instruction to review commonly prescribed medications for heart disease. Reviews the medication, class of the drug, and side effects. Includes the steps to properly store meds and maintain the prescription regimen. Flowsheet Row Cardiac Rehab from 12/01/2016 in [facility] Cardiac and Pulmonary Rehab  Date  10/22/16 Marisue Humble 2]  Educator  SB  Instruction Review Code  2- meets goals/outcomes      Go Sex-Intimacy & Heart Disease, Get SMART - Goal Setting: - Group verbal and written instruction through game format to discuss heart disease and the return to sexual intimacy. Provides group verbal and written material to discuss and apply goal setting through the application of the S.M.A.R.T. Method.   Other Matters of the Heart: - Provides group verbal, written materials and models to describe Heart Failure, Angina, Valve Disease, and Diabetes in the realm of heart disease. Includes description of the disease process and treatment options available to the cardiac patient. Flowsheet Row Cardiac Rehab from 12/01/2016 in [facility] Cardiac and Pulmonary Rehab  Date  10/06/16  Educator  SB  Instruction Review Code  2- meets goals/outcomes      Exercise & Equipment Safety: - Individual verbal instruction and demonstration of equipment use and safety with use of the equipment. Flowsheet Row Cardiac Rehab from 12/01/2016 in [facility] Cardiac and Pulmonary Rehab  Date  09/11/16  Educator  SB  Instruction Review Code  2- meets goals/outcomes      Infection Prevention: - Provides verbal and written material to individual with discussion of infection control including proper hand washing and proper equipment cleaning during exercise session. Flowsheet Row Cardiac Rehab from 12/01/2016 in [facility] Cardiac and Pulmonary Rehab  Date  09/11/16  Educator  SB  Instruction Review Code  2- meets goals/outcomes       Falls Prevention: - Provides verbal and written material to individual with discussion of falls prevention and safety. Flowsheet Row Cardiac Rehab from 12/01/2016 in [facility] Cardiac and Pulmonary Rehab  Date  09/11/16  Educator  SB  Instruction Review Code  2- meets goals/outcomes      Diabetes: - Individual verbal and written instruction to review signs/symptoms of diabetes, desired ranges of glucose level fasting, after meals and with exercise. Advice that pre and post exercise glucose checks will be done for 3 sessions at entry of program. Flowsheet Row Cardiac Rehab from 12/01/2016 in [facility] Cardiac and Pulmonary Rehab  Date  09/11/16  Educator  SB  Instruction Review Code  2- meets goals/outcomes       Knowledge Questionnaire Score:     Knowledge Questionnaire Score - 09/11/16 1447      Knowledge Questionnaire Score   Pre Score 20/28      Core Components/Risk Factors/Patient Goals at Admission:     Personal Goals and Risk Factors at Admission - 09/11/16 1432      Core Components/Risk Factors/Patient Goals on Admission    Weight Management Obesity;Weight Maintenance;Yes   Admit Weight 295 lb (133.8 kg)   Goal Weight: Short Term 290 lb (131.5 kg)   Goal Weight: Long Term 220 lb (99.8 kg)   Expected Outcomes Short Term: Continue to assess and modify interventions until short term weight is achieved;Long Term: Adherence to nutrition and physical activity/exercise program aimed toward attainment of established weight goal   Sedentary Yes   Intervention Provide advice, education, support and counseling about physical activity/exercise needs.;Develop an individualized exercise prescription for aerobic and resistive training based on initial evaluation findings, risk stratification, comorbidities and participant's personal goals.   Expected Outcomes Achievement of increased cardiorespiratory fitness and enhanced flexibility, muscular endurance and strength shown through  measurements of functional capacity and personal statement of participant.   Increase Strength and Stamina Yes   Intervention Provide advice, education, support and counseling about physical activity/exercise needs.;Develop an individualized exercise prescription for aerobic and resistive training based on initial evaluation findings, risk stratification, comorbidities and participant's personal goals.   Expected Outcomes Achievement of increased cardiorespiratory fitness and enhanced flexibility, muscular endurance and strength shown through measurements of functional capacity and personal statement of participant.   Diabetes Yes   Intervention Provide education about signs/symptoms and action to take for hypo/hyperglycemia.;Provide education about proper nutrition, including hydration, and aerobic/resistive exercise prescription along with prescribed medications to achieve [name] glucose in normal ranges: Fasting glucose 65-99 mg/dL   Expected Outcomes Short Term: Participant verbalizes understanding of the signs/symptoms and immediate care of hyper/hypoglycemia, proper foot care and importance of medication, aerobic/resistive exercise and nutrition plan for [name] glucose control.;Long Term: Attainment of HbA1C < 7%.   Hypertension Yes   Intervention Provide education on lifestyle modifcations including regular physical activity/exercise, weight management, moderate sodium restriction and increased consumption of fresh fruit, vegetables, and low fat dairy, alcohol moderation, and smoking cessation.;Monitor prescription use compliance.   Expected Outcomes Short Term: Continued assessment and intervention until BP is < 140/80m HG in hypertensive  participants. < 130/12m HG in hypertensive participants with diabetes, heart failure or chronic kidney disease.;Long Term: Maintenance of [name] pressure at goal levels.   Lipids Yes  Statins cause muscle pain.   Crestor is new and so far doing well taking the  Crestor   Intervention Provide education and support for participant on nutrition & aerobic/resistive exercise along with prescribed medications to achieve LDL <719m HDL >4064m  Expected Outcomes Short Term: Participant states understanding of desired cholesterol values and is compliant with medications prescribed. Participant is following exercise prescription and nutrition guidelines.;Long Term: Cholesterol controlled with medications as prescribed, with individualized exercise RX and with personalized nutrition plan. Value goals: LDL < 64m73mDL > 40 mg.      Core Components/Risk Factors/Patient Goals Review:      Goals and Risk Factor Review    Row Name 09/24/16 1643 10/23/16 1103 11/27/16 1353 12/01/16 1656       Core Components/Risk Factors/Patient Goals Review   Personal Goals Review Weight Management/Obesity;Diabetes;Stress Weight Management/Obesity;Diabetes;Hypertension Sedentary;Diabetes Diabetes;Hypertension;Lipids    Review Lawrence [name] sugar was 57. Christain ate 2 of our glucose tablets but said they dont help so given 2 Smartie candies.  WaynPatrick Jupiterorts his BG has been between 104-213 measuring at home.  His BP has been steady.  [facility] and doesn't feel he needs to meet with them here.  he feels the most important thing right now is regular exercise.  He was given info on the [employer] we discussed the options for when he graduates. GarlCheoynPatrick Jupiters unable to attend Cardiac REhab due to a skin graft on his toe. WaynPatrick [name] clearnance to return "as long as he did not stub his toe'. WaynPatrick [name] been able to walk on the treadmill since his return with no problems. Lawrence [name] [name] sugars have been flucuating. One time he  asked me to check his [name] sugar since he felt it was low and it was like 180. We check Lawrence [name]'es [name] sugars frequenlty in Cardiac Rehab.  WaynPatrick Jupitertes he is compliant with his BP and cholesterol meds and he is attempting to follow the proper nutrition plan.     Expected Outcomes Main goal is to get [name] sugars in appropriate range. Decreased weight at some point. WaynPatrick Jupiterl continue to develop regular exercise habits in [facility] and will maintain after he graduates.  Regular exercise will help him control his risk factors and reduce risk of another CV event.  - Continue adherence to risk factor control       Core Components/Risk Factors/Patient Goals at Discharge (Final Review):      Goals and Risk Factor Review - 12/01/16 1656      Core Components/Risk Factors/Patient Goals Review   Personal Goals Review Diabetes;Hypertension;Lipids   Review WaynPatrick Jupitertes he is compliant with his BP and cholesterol meds and he is attempting to follow the proper nutrition plan.    Expected Outcomes Continue adherence to risk factor control      ITP Comments:     ITP Comments    Row Name 09/11/16 1430 09/11/16 1450 09/24/16 1643 10/02/16 1758 10/06/16 1812   ITP Comments [facility] review completed today Initial  ITP created. Documentation of Diagnosis can be found in [facility]  Surgery 06/27/2016 [facility] review completed today Initial ITP created. Documentation of Diagnosis can be found in [facility]  Surgery 06/24/2016 [facility]" said he has seen so many dieticians over the years and his [name] sugars have been way up and way down some days. He sees an Endocrine MD at the [place].  [place] felt symptomatic for low BG and checked it himself without informing staff.  He was eating a candy bar and it was advised to stop by staff.  He was given glucose tabs, BG was 106 upon recheck and he was given crackers before leaving. [place] stsed he was experiencing pressure in his throat/chest / on pain scale. This occured while on the  treadmill, he was slowed down and symptoms reduced/ He was stopped and the symptoms resolved.  He stated he was experiencing throat/sinus drainage today and felt stuffed up. He was advised to call his PMd and his cardiololgist to report the various symptoms. He had experienced the throat discomfort over the weekend at times too.    Row Name 10/08/16 2774 10/30/16 1655 11/04/16 0631 12/01/16 1739 12/03/16 0630   ITP Comments 30 day review completed for review by Dr Emily Filbert.  Continue with ITP unless changes noted by Dr Sabra Heck. "Lawrence [name]" Martha said he has had a rough year this past year with carpel tunnel syndrom surgery and other medical problems. Lawrence [name] said he didn't feel well today after exercising for about 10  minutes on the Nustep. His [name] sugar was 125 and his [name] pressure was stable at 140/80.  30 day review. Continue with ITP unless directed changes per Medical Director review. [place] complaioned of burning sensation in his throat during exercise today. He was asked to slow downa nd the symptoms resolved with less workload. He was advised to keep his workload at the lower level.  No more symptoms occured today.  Review with Lawrence [name] found that he has had the symptoms prior to today with exertion. He has been advised to talk to his physician about the symptoms. He was also advised to call 911 if symptoms occur and do not resolve with rest.  He verbalized understanding of the advice provided today 30 day review. Continue with ITP unless directed changes per Medical Director review.      Comments:

## 2016-12-04 ENCOUNTER — Encounter: Payer: Medicare Other | Attending: Cardiothoracic Surgery

## 2016-12-04 DIAGNOSIS — Z951 Presence of aortocoronary bypass graft: Secondary | ICD-10-CM | POA: Diagnosis not present

## 2016-12-04 NOTE — Progress Notes (Signed)
Daily Session Note  Patient Details  Name: Lawrence [name] MRN: [id number] Date of Birth: [birth year] Referring Provider:   Flowsheet Row Cardiac Rehab from 09/11/2016 in [facility] Cardiac and Pulmonary Rehab  Referring Provider  Serafina Royals MD      Encounter Date: 12/04/2016  Check In:     Session Check In - 12/04/16 1649      Check-In   Location [facility]-Cardiac & Pulmonary Rehab   Staff Present Gerlene Burdock, RN, Vickki Hearing, BA, ACSM CEP, Exercise Physiologist;Kelly Amedeo Plenty, BS, ACSM CEP, Exercise Physiologist   Supervising physician immediately available to respond to emergencies See telemetry face sheet for immediately available ER MD   Medication changes reported     No   Fall or balance concerns reported    No   Warm-up and Cool-down Performed on first and last piece of equipment   Resistance Training Performed Yes   VAD Patient? No     Pain Assessment   Currently in Pain? No/denies   Multiple Pain Sites No         Goals Met:  Independence with exercise equipment Exercise tolerated well No report of cardiac concerns or symptoms Strength training completed today  Goals Unmet:  Not Applicable  Comments: Pt able to follow exercise prescription today without complaint.  Will continue to monitor for progression.    Dr. Emily Filbert is Medical Director for [place] and [facility].

## 2016-12-08 ENCOUNTER — Encounter: Payer: Medicare Other | Admitting: *Deleted

## 2016-12-08 VITALS — Ht 70.0 in | Wt 296.0 lb

## 2016-12-08 DIAGNOSIS — Z951 Presence of aortocoronary bypass graft: Secondary | ICD-10-CM | POA: Diagnosis not present

## 2016-12-08 NOTE — Progress Notes (Signed)
   Subjective:  Patient presents today for follow-up evaluation of an ulceration to the second digit left foot secondary to diabetes mellitus. Patient states he is doing much better. Patient presents today for possible application of skin graft.   Objective/Physical Exam General: The patient is alert and oriented x3 in no acute distress.  Dermatology:  Wound #1 noted to the distal tuft of the second digit left foot  measuring [phone] cm (LxWxD).   To the noted ulceration(s), there is no eschar. There is a moderate amount of slough, fibrin, and necrotic tissue noted. Granulation tissue and wound base is red. There is a minimal amount of serosanguineous drainage noted. There is no exposed bone muscle-tendon ligament or joint. There is no malodor. Periwound integrity is intact with localized cellulitis around the second digit left foot. Skin is warm, dry and supple bilateral lower extremities.  Vascular: Palpable pedal pulses bilaterally. No edema or erythema noted. Capillary refill within normal limits.  Neurological: Epicritic and protective threshold absent bilaterally.   Musculoskeletal Exam: Range of motion within normal limits to all pedal and ankle joints bilateral. Muscle strength 5/5 in all groups bilateral.   Assessment: #1 ulceration second digit left foot secondary to diabetes mellitus #2 diabetes mellitus w/ peripheral neuropathy   Plan of Care:  #1 Patient was evaluated. #2 medically necessary excisional debridement including subcutaneous tissue was performed using a tissue nipper and a chisel blade. Excisional debridement of all the necrotic nonviable tissue down to healthy bleeding viable tissue was performed with post-debridement measurements same as pre-. #3 the wound was cleansed and dry sterile dressing applied. #4 today wound cultures were taken. #5 prescription for Bactrim DS #6 prescription for mupirocin cream #7 return to clinic in 2 weeks   [name],  DPM [facility]  Dr. [name], [place] [place]                                        [place], [place] [address]                Office [phone]  Fax [phone]

## 2016-12-08 NOTE — Progress Notes (Signed)
Daily Session Note  Patient Details  Name: Lawrence [name] MRN: [id number] Date of Birth: 1951/03/11 Referring Provider:   Flowsheet Row Cardiac Rehab from 09/11/2016 in [facility] Cardiac and Pulmonary Rehab  Referring Provider  Serafina Royals MD      Encounter Date: 12/08/2016  Check In:     Session Check In - 12/08/16 1713      Check-In   Location [facility]-Cardiac & Pulmonary Rehab   Staff Present Gerlene Burdock, RN, BSN;Mary Kellie Shropshire, RN, BSN, Bonnita Hollow, BS, ACSM CEP, Exercise Physiologist   Supervising physician immediately available to respond to emergencies See telemetry face sheet for immediately available ER MD   Medication changes reported     No   Fall or balance concerns reported    No   Warm-up and Cool-down Performed on first and last piece of equipment   Resistance Training Performed Yes   VAD Patient? No     Pain Assessment   Currently in Pain? No/denies         Goals Met:  Proper associated with RPD/PD & O2 Sat Exercise tolerated well  Goals Unmet:  Not Applicable  Comments:     Dr. Emily Filbert is Medical Director for [place] and [facility].

## 2016-12-10 DIAGNOSIS — I25708 Atherosclerosis of coronary artery bypass graft(s), unspecified, with other forms of angina pectoris: Secondary | ICD-10-CM | POA: Diagnosis not present

## 2016-12-10 DIAGNOSIS — E782 Mixed hyperlipidemia: Secondary | ICD-10-CM | POA: Diagnosis not present

## 2016-12-10 DIAGNOSIS — I10 Essential (primary) hypertension: Secondary | ICD-10-CM | POA: Diagnosis not present

## 2016-12-10 DIAGNOSIS — I6523 Occlusion and stenosis of bilateral carotid arteries: Secondary | ICD-10-CM | POA: Insufficient documentation

## 2016-12-10 DIAGNOSIS — Z951 Presence of aortocoronary bypass graft: Secondary | ICD-10-CM

## 2016-12-10 DIAGNOSIS — L97521 Non-pressure chronic ulcer of other part of left foot limited to breakdown of skin: Secondary | ICD-10-CM | POA: Diagnosis not present

## 2016-12-10 DIAGNOSIS — Z6841 Body Mass Index (BMI) 40.0 and over, adult: Secondary | ICD-10-CM | POA: Diagnosis not present

## 2016-12-10 NOTE — Progress Notes (Signed)
Daily Session Note  Patient Details  Name: Lawrence [name] MRN: [id number] Date of Birth: June 01, 1951 Referring Provider:   Flowsheet Row Cardiac Rehab from 09/11/2016 in [facility] Cardiac and Pulmonary Rehab  Referring Provider  Serafina Royals MD      Encounter Date: 12/10/2016  Check In:     Session Check In - 12/10/16 1628      Check-In   Location [facility]-Cardiac & Pulmonary Rehab   Staff Present Levell July RN BSN;Joley Utecht, RN, Vickki Hearing, BA, ACSM CEP, Exercise Physiologist   Supervising physician immediately available to respond to emergencies See telemetry face sheet for immediately available ER MD   Medication changes reported     No   Fall or balance concerns reported    No   Warm-up and Cool-down Performed on first and last piece of equipment   Resistance Training Performed Yes   VAD Patient? No     Pain Assessment   Currently in Pain? No/denies   Multiple Pain Sites No         Goals Met:  Proper associated with RPD/PD & O2 Sat Exercise tolerated well No report of cardiac concerns or symptoms  Goals Unmet:  Not Applicable  Comments:     Dr. Emily Filbert is Medical Director for [place] and [facility].

## 2016-12-11 ENCOUNTER — Encounter: Payer: Medicare Other | Admitting: *Deleted

## 2016-12-11 DIAGNOSIS — Z951 Presence of aortocoronary bypass graft: Secondary | ICD-10-CM

## 2016-12-11 NOTE — Progress Notes (Signed)
Daily Session Note  Patient Details  Name: Lawrence [name] MRN: [id number] Date of Birth: [birth year] Referring Provider:   Flowsheet Row Cardiac Rehab from 09/11/2016 in [facility] Cardiac and Pulmonary Rehab  Referring Provider  Serafina Royals MD      Encounter Date: 12/11/2016  Check In:     Session Check In - 12/11/16 1628      Check-In   Location [facility]-Cardiac & Pulmonary Rehab   Staff Present Nada Maclachlan, BA, ACSM CEP, Exercise Physiologist;Alexey Rhoads Amedeo Plenty, BS, ACSM CEP, Exercise Physiologist;Patricia Surles RN BSN;Other   Supervising physician immediately available to respond to emergencies See telemetry face sheet for immediately available ER MD   Medication changes reported     No   Fall or balance concerns reported    No   Warm-up and Cool-down Performed on first and last piece of equipment   Resistance Training Performed Yes   VAD Patient? No     Pain Assessment   Currently in Pain? No/denies   Multiple Pain Sites No           Exercise Prescription Changes - 12/11/16 1100      Response to Exercise   Blood Pressure (Admit) 144/62   Blood Pressure (Exercise) 142/64   Blood Pressure (Exit) 130/72   Heart Rate (Admit) 87 bpm   Heart Rate (Exercise) 105 bpm   Heart Rate (Exit) 86 bpm   Rating of Perceived Exertion (Exercise) 14   Duration Progress to 45 minutes of aerobic exercise without signs/symptoms of physical distress   Intensity THRR unchanged     Progression   Progression Continue to progress workloads to maintain intensity without signs/symptoms of physical distress.     Resistance Training   Training Prescription Yes   Weight 3   Reps 10-12     Interval Training   Interval Training No     Treadmill   MPH 2   Grade 1   Minutes 15   METs 2.81     NuStep   Minutes 15   METs 2.3      Goals Met:  Independence with exercise equipment Exercise tolerated well No report of cardiac concerns or symptoms Strength training completed today  Goals  Unmet:  Not Applicable  Comments: Pt able to follow exercise prescription today without complaint.  Will continue to monitor for progression.    Dr. Emily Filbert is Medical Director for [place] and [facility].

## 2016-12-12 ENCOUNTER — Encounter: Payer: Self-pay | Admitting: Podiatry

## 2016-12-12 ENCOUNTER — Ambulatory Visit (INDEPENDENT_AMBULATORY_CARE_PROVIDER_SITE_OTHER): Payer: Medicare Other | Admitting: Podiatry

## 2016-12-12 VITALS — BP 150/66 | HR 72 | Resp 16

## 2016-12-12 DIAGNOSIS — I70245 Atherosclerosis of native arteries of left leg with ulceration of other part of foot: Secondary | ICD-10-CM

## 2016-12-12 DIAGNOSIS — E1143 Type 2 diabetes mellitus with diabetic autonomic (poly)neuropathy: Secondary | ICD-10-CM

## 2016-12-12 DIAGNOSIS — L97522 Non-pressure chronic ulcer of other part of left foot with fat layer exposed: Secondary | ICD-10-CM | POA: Diagnosis not present

## 2016-12-12 NOTE — Patient Instructions (Signed)
Discharge Instructions  Patient Details  Name: Lawrence [name] MRN: [id number] Date of Birth: Mar 04, 1951 Referring Provider:  Ivin Poot, MD   Number of Visits: 28  Reason for Discharge:  Patient reached a stable level of exercise. Patient independent in their exercise.  Smoking History:  History  Smoking Status  . Former Smoker  . Packs/day: 1.00  . Years: 16.00  . Types: Cigarettes  . Start date: 11/03/1969  . Quit date: 07/22/1986  Smokeless Tobacco  . Never Used    Diagnosis:  S/P CABG x 4  Initial Exercise Prescription:     Initial Exercise Prescription - 09/11/16 1400      Date of Initial Exercise RX and Referring Provider   Date 09/11/16   Referring Provider Serafina Royals MD     Treadmill   MPH 1.5   Grade 0.5   Minutes 15   METs 2.25     NuStep   Level 1   Watts --  80-100 spm   Minutes 15   METs 2     T5 Nustep   Level 1   Watts --  80-100 spm   Minutes 15   METs 2     Prescription Details   Frequency (times per week) 3   Duration Progress to 45 minutes of aerobic exercise without signs/symptoms of physical distress     Intensity   THRR 40-80% of Max Heartrate 108-135   Ratings of Perceived Exertion 11-15   Perceived Dyspnea 0-4     Progression   Progression Continue to progress workloads to maintain intensity without signs/symptoms of physical distress.     Resistance Training   Training Prescription Yes   Weight 3 lbs   Reps 10-12      Discharge Exercise Prescription (Final Exercise Prescription Changes):     Exercise Prescription Changes - 12/11/16 1100      Response to Exercise   Blood Pressure (Admit) 144/62   Blood Pressure (Exercise) 142/64   Blood Pressure (Exit) 130/72   Heart Rate (Admit) 87 bpm   Heart Rate (Exercise) 105 bpm   Heart Rate (Exit) 86 bpm   Rating of Perceived Exertion (Exercise) 14   Duration Progress to 45 minutes of aerobic exercise without signs/symptoms of physical distress   Intensity THRR unchanged     Progression   Progression Continue to progress workloads to maintain intensity without signs/symptoms of physical distress.     Resistance Training   Training Prescription Yes   Weight 3   Reps 10-12     Interval Training   Interval Training No     Treadmill   MPH 2   Grade 1   Minutes 15   METs 2.81     NuStep   Minutes 15   METs 2.3      Functional Capacity:     6 Minute Walk    Row Name 09/11/16 1455 12/08/16 1657       6 Minute Walk   Phase Initial Discharge    Distance 1084 feet 1320 feet    Distance % Change  - 21.7 %    Walk Time 6 minutes 6 minutes    # of Rest Breaks 0 0    MPH 2.05 2.5    METS 2.57 2.91    RPE 13 14    VO2 Peak 6.43 8.4    Symptoms No Yes (comment)    Comments  - "burning" in throat,(not new during exercise) "fist" at lower sternum -  sees Dr soon     Resting HR 72 bpm 94 bpm    Resting BP 126/64 132/64    Max Ex. HR 102 bpm 110 bpm    Max Ex. BP 134/84 144/60    2 Minute Post BP 126/62  -       Quality of Life:     Quality of Life - 12/12/16 1100      Quality of Life Scores   Health/Function Pre 25.25 %   Health/Function Post 10.53 %   Health/Function % Change -58.3 %   Socioeconomic Pre 24.29 %   Socioeconomic Post 21 %   Socioeconomic % Change  -13.54 %   Psych/Spiritual Pre 28.29 %   Psych/Spiritual Post 20.57 %   Psych/Spiritual % Change -27.29 %   Family Pre 30 %   Family Post 18 %   Family % Change -40 %   GLOBAL Pre 26.41 %   GLOBAL Post 15.79 %   GLOBAL % Change -40.21 %      Personal Goals: Goals established at orientation with interventions provided to work toward goal.     Personal Goals and Risk Factors at Admission - 09/11/16 1432      Core Components/Risk Factors/Patient Goals on Admission    Weight Management Obesity;Weight Maintenance;Yes   Admit Weight 295 lb (133.8 kg)   Goal Weight: Short Term 290 lb (131.5 kg)   Goal Weight: Long Term 220 lb (99.8 kg)    Expected Outcomes Short Term: Continue to assess and modify interventions until short term weight is achieved;Long Term: Adherence to nutrition and physical activity/exercise program aimed toward attainment of established weight goal   Sedentary Yes   Intervention Provide advice, education, support and counseling about physical activity/exercise needs.;Develop an individualized exercise prescription for aerobic and resistive training based on initial evaluation findings, risk stratification, comorbidities and participant's personal goals.   Expected Outcomes Achievement of increased cardiorespiratory fitness and enhanced flexibility, muscular endurance and strength shown through measurements of functional capacity and personal statement of participant.   Increase Strength and Stamina Yes   Intervention Provide advice, education, support and counseling about physical activity/exercise needs.;Develop an individualized exercise prescription for aerobic and resistive training based on initial evaluation findings, risk stratification, comorbidities and participant's personal goals.   Expected Outcomes Achievement of increased cardiorespiratory fitness and enhanced flexibility, muscular endurance and strength shown through measurements of functional capacity and personal statement of participant.   Diabetes Yes   Intervention Provide education about signs/symptoms and action to take for hypo/hyperglycemia.;Provide education about proper nutrition, including hydration, and aerobic/resistive exercise prescription along with prescribed medications to achieve blood glucose in normal ranges: Fasting glucose 65-99 mg/dL   Expected Outcomes Short Term: Participant verbalizes understanding of the signs/symptoms and immediate care of hyper/hypoglycemia, proper foot care and importance of medication, aerobic/resistive exercise and nutrition plan for blood glucose control.;Long Term: Attainment of HbA1C < 7%.    Hypertension Yes   Intervention Provide education on lifestyle modifcations including regular physical activity/exercise, weight management, moderate sodium restriction and increased consumption of fresh fruit, vegetables, and low fat dairy, alcohol moderation, and smoking cessation.;Monitor prescription use compliance.   Expected Outcomes Short Term: Continued assessment and intervention until BP is < 140/72mm HG in hypertensive participants. < 130/42mm HG in hypertensive participants with diabetes, heart failure or chronic kidney disease.;Long Term: Maintenance of blood pressure at goal levels.   Lipids Yes  Statins cause muscle pain.   Crestor is new and so far doing well taking  the Crestor   Intervention Provide education and support for participant on nutrition & aerobic/resistive exercise along with prescribed medications to achieve LDL 70mg , HDL >40mg .   Expected Outcomes Short Term: Participant states understanding of desired cholesterol values and is compliant with medications prescribed. Participant is following exercise prescription and nutrition guidelines.;Long Term: Cholesterol controlled with medications as prescribed, with individualized exercise RX and with personalized nutrition plan. Value goals: LDL < 70mg , HDL > 40 mg.       Personal Goals Discharge:     Goals and Risk Factor Review - 12/10/16 1635      Core Components/Risk Factors/Patient Goals Review   Personal Goals Review Hypertension;Diabetes;Lipids   Review Antonino's blood pressure has been better. His MD follows up with his lipid blood work and he is working on exercising and his diet to help that blood work look better.    Expected Outcomes To get healthier-heart healthier lifestyle.      Nutrition & Weight - Outcomes:     Pre Biometrics - 09/11/16 1459      Pre Biometrics   Height 5\' 10"  (1.778 m)   Weight 295 lb (133.8 kg)   Waist Circumference 51.25 inches   Hip Circumference 55.5 inches   Waist to Hip  Ratio 0.92 %   BMI (Calculated) 42.4   Single Leg Stand 1.5 seconds         Post Biometrics - 12/08/16 1656       Post  Biometrics   Height 5\' 10"  (1.778 m)   Weight 296 lb (134.3 kg)   Waist Circumference 51 inches   Hip Circumference 55 inches   Waist to Hip Ratio 0.93 %   BMI (Calculated) 42.6   Single Leg Stand 2.47 seconds      Nutrition:     Nutrition Therapy & Goals - 12/01/16 1658      Nutrition Therapy   Diet Deferred RD appointment      Nutrition Discharge:     Nutrition Assessments - 12/12/16 1100      Rate Your Plate Scores   Post Score 65   Post Score % 72.2 %      Education Questionnaire Score:     Knowledge Questionnaire Score - 12/12/16 1100      Knowledge Questionnaire Score   Pre Score 20/28   Post Score 18/28      Goals reviewed with patient; copy given to patient.

## 2016-12-15 ENCOUNTER — Encounter: Payer: Medicare Other | Admitting: *Deleted

## 2016-12-15 DIAGNOSIS — Z951 Presence of aortocoronary bypass graft: Secondary | ICD-10-CM

## 2016-12-15 LAB — GLUCOSE, CAPILLARY: Glucose-Capillary: 113 mg/dL — ABNORMAL HIGH (ref 65–99)

## 2016-12-15 NOTE — Progress Notes (Signed)
Discharge Summary  Patient Details  Name: Lawrence [name] MRN: [id number] Date of Birth: Aug 27, 1951 Referring Provider:   Flowsheet Row Cardiac Rehab from 09/11/2016 in [facility]  Referring Provider  Serafina Royals MD       Number of Visits: 36  Reason for Discharge:  Patient reached a stable level of exercise. Patient independent in their exercise.  Smoking History:  History  Smoking Status  . Former Smoker  . Packs/day: 1.00  . Years: 16.00  . Types: Cigarettes  . Start date: 11/03/1969  . Quit date: 07/22/1986  Smokeless Tobacco  . Never Used    Diagnosis:  S/P CABG x 4  ADL UCSD:   Initial Exercise Prescription:     Initial Exercise Prescription - 09/11/16 1400      Date of Initial Exercise RX and Referring Provider   Date 09/11/16   Referring Provider Serafina Royals MD     Treadmill   MPH 1.5   Grade 0.5   Minutes 15   METs 2.25     NuStep   Level 1   Watts --  80-100 spm   Minutes 15   METs 2     T5 Nustep   Level 1   Watts --  80-100 spm   Minutes 15   METs 2     Prescription Details   Frequency (times per week) 3   Duration Progress to 45 minutes of aerobic exercise without signs/symptoms of physical distress     Intensity   THRR 40-80% of Max Heartrate 108-135   Ratings of Perceived Exertion 11-15   Perceived Dyspnea 0-4     Progression   Progression Continue to progress workloads to maintain intensity without signs/symptoms of physical distress.     Resistance Training   Training Prescription Yes   Weight 3 lbs   Reps 10-12      Discharge Exercise Prescription (Final Exercise Prescription Changes):     Exercise Prescription Changes - 12/11/16 1100      Response to Exercise   Blood Pressure (Admit) 144/62   Blood Pressure (Exercise) 142/64   Blood Pressure (Exit) 130/72   Heart Rate (Admit) 87 bpm   Heart Rate (Exercise) 105 bpm   Heart Rate (Exit) 86 bpm   Rating of Perceived Exertion  (Exercise) 14   Duration Progress to 45 minutes of aerobic exercise without signs/symptoms of physical distress   Intensity THRR unchanged     Progression   Progression Continue to progress workloads to maintain intensity without signs/symptoms of physical distress.     Resistance Training   Training Prescription Yes   Weight 3   Reps 10-12     Interval Training   Interval Training No     Treadmill   MPH 2   Grade 1   Minutes 15   METs 2.81     NuStep   Minutes 15   METs 2.3      Functional Capacity:     6 Minute Walk    Row Name 09/11/16 1455 12/08/16 1657       6 Minute Walk   Phase Initial Discharge    Distance 1084 feet 1320 feet    Distance % Change  - 21.7 %    Walk Time 6 minutes 6 minutes    # of Rest Breaks 0 0    MPH 2.05 2.5    METS 2.57 2.91    RPE 13 14    VO2 Peak 6.43  8.4    Symptoms No Yes (comment)    Comments  - "burning" in throat,(not new during exercise) "fist" at lower sternum - sees Dr soon     Resting HR 72 bpm 94 bpm    Resting BP 126/64 132/64    Max Ex. HR 102 bpm 110 bpm    Max Ex. BP 134/84 144/60    2 Minute Post BP 126/62  -       Psychological, QOL, Others - Outcomes: PHQ 2/9: Depression screen [facility] 2/9 12/12/2016 09/11/2016 09/05/2016  Decreased Interest 1 0 0  Down, Depressed, Hopeless 1 1 0  PHQ - 2 Score 2 1 0  Altered sleeping 3 1 -  Tired, decreased energy 1 1 -  Change in appetite 1 0 -  Feeling bad or failure about yourself  1 1 -  Trouble concentrating 1 1 -  Moving slowly or fidgety/restless 1 1 -  Suicidal thoughts 0 0 -  PHQ-9 Score 10 6 -  Difficult doing work/chores Somewhat difficult Somewhat difficult -    Quality of Life:     Quality of Life - 12/12/16 1100      Quality of Life Scores   Health/Function Pre 25.25 %   Health/Function Post 10.53 %   Health/Function % Change -58.3 %   Socioeconomic Pre 24.29 %   Socioeconomic Post 21 %   Socioeconomic % Change  -13.54 %   Psych/Spiritual Pre  28.29 %   Psych/Spiritual Post 20.57 %   Psych/Spiritual % Change -27.29 %   Family Pre 30 %   Family Post 18 %   Family % Change -40 %   GLOBAL Pre 26.41 %   GLOBAL Post 15.79 %   GLOBAL % Change -40.21 %      Personal Goals: Goals established at orientation with interventions provided to work toward goal.     Personal Goals and Risk Factors at Admission - 09/11/16 1432      Core Components/Risk Factors/Patient Goals on Admission    Weight Management Obesity;Weight Maintenance;Yes   Admit Weight 295 lb (133.8 kg)   Goal Weight: Short Term 290 lb (131.5 kg)   Goal Weight: Long Term 220 lb (99.8 kg)   Expected Outcomes Short Term: Continue to assess and modify interventions until short term weight is achieved;Long Term: Adherence to nutrition and physical activity/exercise program aimed toward attainment of established weight goal   Sedentary Yes   Intervention Provide advice, education, support and counseling about physical activity/exercise needs.;Develop an individualized exercise prescription for aerobic and resistive training based on initial evaluation findings, risk stratification, comorbidities and participant's personal goals.   Expected Outcomes Achievement of increased cardiorespiratory fitness and enhanced flexibility, muscular endurance and strength shown through measurements of functional capacity and personal statement of participant.   Increase Strength and Stamina Yes   Intervention Provide advice, education, support and counseling about physical activity/exercise needs.;Develop an individualized exercise prescription for aerobic and resistive training based on initial evaluation findings, risk stratification, comorbidities and participant's personal goals.   Expected Outcomes Achievement of increased cardiorespiratory fitness and enhanced flexibility, muscular endurance and strength shown through measurements of functional capacity and personal statement of participant.    Diabetes Yes   Intervention Provide education about signs/symptoms and action to take for hypo/hyperglycemia.;Provide education about proper nutrition, including hydration, and aerobic/resistive exercise prescription along with prescribed medications to achieve blood glucose in normal ranges: Fasting glucose 65-99 mg/dL   Expected Outcomes Short Term: Participant verbalizes understanding of the  signs/symptoms and immediate care of hyper/hypoglycemia, proper foot care and importance of medication, aerobic/resistive exercise and nutrition plan for blood glucose control.;Long Term: Attainment of HbA1C < 7%.   Hypertension Yes   Intervention Provide education on lifestyle modifcations including regular physical activity/exercise, weight management, moderate sodium restriction and increased consumption of fresh fruit, vegetables, and low fat dairy, alcohol moderation, and smoking cessation.;Monitor prescription use compliance.   Expected Outcomes Short Term: Continued assessment and intervention until BP is < 140/102m HG in hypertensive participants. < 130/859mHG in hypertensive participants with diabetes, heart failure or chronic kidney disease.;Long Term: Maintenance of blood pressure at goal levels.   Lipids Yes  Statins cause muscle pain.   Crestor is new and so far doing well taking the Crestor   Intervention Provide education and support for participant on nutrition & aerobic/resistive exercise along with prescribed medications to achieve LDL <7038mHDL >56m2m Expected Outcomes Short Term: Participant states understanding of desired cholesterol values and is compliant with medications prescribed. Participant is following exercise prescription and nutrition guidelines.;Long Term: Cholesterol controlled with medications as prescribed, with individualized exercise RX and with personalized nutrition plan. Value goals: LDL < 70mg68mL > 40 mg.       Personal Goals Discharge:     Goals and Risk  Factor Review    Row Name 09/24/16 1643 10/23/16 1103 11/27/16 1353 12/01/16 1656 12/10/16 1634     Core Components/Risk Factors/Patient Goals Review   Personal Goals Review Weight Management/Obesity;Diabetes;Stress Weight Management/Obesity;Diabetes;Hypertension Sedentary;Diabetes Diabetes;Hypertension;Lipids Weight Management/Obesity;Sedentary;Diabetes   Review GarlaLoydnePatrick Jupiterd his blood sugars have been so high at times but at 3pm today his blood sugar was 47 and he ate 3 small [place] candy bars which usually helps him but when he arrive at Cardiac REhab at 4pm his initial finger stick blood sugar was 57. Alyssa ate 2 of our glucose tablets but said they dont help so given 2 Smartie candies.  WaynePatrick Jupiterrts his BG has been between 104-213 measuring at home.  His BP has been steady.  [facility] and doesn't feel he needs to meet with them here.  he feels the most important thing right now is regular exercise.  He was given info on the [employer] discussed the options for when he graduates. GarlaLebertnePatrick Jupiter unable to attend Cardiac REhab due to a skin graft on his toe. WaynePatrick Jupiterclearnance to return "as long as he did not stub his toe'. WaynePatrick Jupiterbeen able to walk on the treadmill since his return with no problems. [name]'s blood sugars have been flucuating. One time he asked me to check his blood sugar since he felt it was low and it was like 180. We check Wayn'es blood sugars frequenlty in Cardiac Rehab.  WaynePatrick Jupiteres he is compliant with his BP and cholesterol meds and he is attempting to follow the proper nutrition plan.  Cutler said his diabetes blood sugars for years have been up and down but he notices since he has been exercising his blood sugar have been better.    Expected Outcomes Main goal is to get blood sugars in appropriate range. Decreased weight at some point. WaynePatrick Jupiter continue to develop regular exercise habits in [facility] and will  maintain after he graduates.  Regular exercise will help him control his risk factors and reduce risk of another CV event.  - Continue adherence to risk factor control  -  [place] Name 12/10/16 1635             Core Components/Risk Factors/Patient Goals Review   Personal Goals Review Hypertension;Diabetes;Lipids       Review Landy's blood pressure has been better. His MD follows up with his lipid blood work and he is working on exercising and his diet to help that blood work look better.        Expected Outcomes To get healthier-heart healthier lifestyle.          Nutrition & Weight - Outcomes:     Pre Biometrics - 09/11/16 1459      Pre Biometrics   Height '5\' 10"'  (1.778 m)   Weight 295 lb (133.8 kg)   Waist Circumference 51.25 inches   Hip Circumference 55.5 inches   Waist to Hip Ratio 0.92 %   BMI (Calculated) 42.4   Single Leg Stand 1.5 seconds         Post Biometrics - 12/08/16 1656       Post  Biometrics   Height '5\' 10"'  (1.778 m)   Weight 296 lb (134.3 kg)   Waist Circumference 51 inches   Hip Circumference 55 inches   Waist to Hip Ratio 0.93 %   BMI (Calculated) 42.6   Single Leg Stand 2.47 seconds      Nutrition:     Nutrition Therapy & Goals - 12/01/16 1658      Nutrition Therapy   Diet Deferred RD appointment      Nutrition Discharge:     Nutrition Assessments - 12/15/16 1822      Rate Your Plate Scores   Post Score 65   Post Score % 72.2 %      Education Questionnaire Score:     Knowledge Questionnaire Score - 12/12/16 1100      Knowledge Questionnaire Score   Pre Score 20/28   Post Score 18/28      Goals reviewed with patient; copy given to patient.

## 2016-12-15 NOTE — Progress Notes (Signed)
Cardiac Individual Treatment Plan  Patient Details  Name: Lawrence [name] MRN: [id number] Date of Birth: 03/03/1951 Referring Provider:   Flowsheet Row Cardiac Rehab from 09/11/2016 in [facility] Cardiac and Pulmonary Rehab  Referring Provider  Serafina Royals MD      Initial Encounter Date:  Flowsheet Row Cardiac Rehab from 09/11/2016 in [facility] Cardiac and Pulmonary Rehab  Date  09/11/16  Referring Provider  Serafina Royals MD      Visit Diagnosis: S/P CABG x 4  Patient's Home Medications on Admission:  Current Outpatient Prescriptions:  .  aspirin 81 MG EC tablet, Take 81 mg by mouth daily. Swallow whole., Disp: , Rfl:  .  cholecalciferol (VITAMIN D) 1000 units tablet, Take 1,000 Units by mouth daily., Disp: , Rfl:  .  docusate sodium (COLACE) 100 MG capsule, Take 200 mg by mouth 2 (two) times daily., Disp: , Rfl:  .  furosemide (LASIX) 40 MG tablet, Take 1 tablet (40 mg total) by mouth daily., Disp: 30 tablet, Rfl: 1 .  glucagon (GLUCAGON EMERGENCY) 1 MG injection, Inject 1 mg into the vein once as needed. Reported on 02/11/2016, Disp: , Rfl:  .  insulin aspart (NOVOLOG) 100 UNIT/ML injection, Inject 20 Units into the skin 3 (three) times daily. (Patient taking differently: Inject 20 Units into the skin 3 (three) times daily with meals. ), Disp: 10 mL, Rfl: 5 .  insulin glargine (LANTUS) 100 UNIT/ML injection, Inject 20 Units into the skin at bedtime. , Disp: , Rfl:  .  latanoprost (XALATAN) 0.005 % ophthalmic solution, Place 1 drop into both eyes at bedtime., Disp: , Rfl:  .  loratadine (CLARITIN) 10 MG tablet, Take 10 mg by mouth daily., Disp: , Rfl:  .  metoprolol tartrate (LOPRESSOR) 25 MG tablet, Take 25 mg by mouth 2 (two) times daily., Disp: , Rfl:  .  Multiple Vitamin (MULTIVITAMIN) capsule, Take 1 capsule by mouth daily., Disp: , Rfl:  .  mupirocin ointment (BACTROBAN) 2 %, Apply 1 application topically 2 (two) times daily., Disp: 22 g, Rfl: 0 .  potassium chloride SA  (K-DUR,KLOR-CON) 20 MEQ tablet, Take 1 tablet (20 mEq total) by mouth daily., Disp: 30 tablet, Rfl: 1 .  rosuvastatin (CRESTOR) 20 MG tablet, Take 1 tablet (20 mg total) by mouth daily at 6 PM., Disp: 30 tablet, Rfl: 1 .  sulfamethoxazole-trimethoprim (BACTRIM DS,SEPTRA DS) 800-160 MG tablet, Take 1 tablet by mouth 2 (two) times daily., Disp: 20 tablet, Rfl: 0 .  vitamin C (ASCORBIC ACID) 500 MG tablet, Take 500 mg by mouth 2 (two) times daily. , Disp: , Rfl:   Past Medical History: Past Medical History:  Diagnosis Date  . CAD (coronary artery disease)    07/2007  . Diabetes ([place])   . Elevated lipids   . Glaucoma   . HBP (high blood pressure)   . Neuropathy ([place])   . Osteoarthritis   . Skin cancer   . Sleep apnea    CPAP    Tobacco Use: History  Smoking Status  . Former Smoker  . Packs/day: 1.00  . Years: 16.00  . Types: Cigarettes  . Start date: 11/03/1969  . Quit date: 07/22/1986  Smokeless Tobacco  . Never Used    Labs: Recent Review Flowsheet Data    Labs for ITP Cardiac and Pulmonary Rehab Latest Ref Rng & Units 06/27/2016 06/27/2016 06/27/2016 06/28/2016 09/08/2016   Cholestrol 0 - 200 mg/dL - - - - 164   LDLCALC 0 - 99 mg/dL - - - -  69   HDL >39.00 mg/dL - - - - 81.00   Trlycerides 0.0 - 149.0 mg/dL - - - - 69.0   Hemoglobin A1c 4.6 - 6.5 % - - - - 7.9(H)   PHART 7.350 - 7.450 7.372 - 7.390 - -   PCO2ART 35.0 - 45.0 mmHg 46.1(H) - 45.2(H) - -   HCO3 20.0 - 24.0 mEq/L 26.8(H) - 27.3(H) - -   TCO2 0 - 100 mmol/L _0 -   O2SAT % 97.0 - 96.0 - -       Exercise Target Goals:    Exercise Program Goal: Individual exercise prescription set with THRR, safety & activity barriers. Participant demonstrates ability to understand and report RPE using BORG scale, to self-measure pulse accurately, and to acknowledge the importance of the exercise prescription.  Exercise Prescription Goal: Starting with aerobic activity 30 plus minutes a day, 3 days per week for  initial exercise prescription. Provide home exercise prescription and guidelines that participant acknowledges understanding prior to discharge.  Activity Barriers & Risk Stratification:     Activity Barriers & Cardiac Risk Stratification - 09/11/16 1446      Activity Barriers & Cardiac Risk Stratification   Activity Barriers Left Knee Replacement;Arthritis;Muscular Weakness;Deconditioning;Assistive Device;History of Falls;Balance Concerns;Other (comment)   Comments neuropathy in both legs and feet   Cardiac Risk Stratification High      6 Minute Walk:     6 Minute Walk    Row Name 09/11/16 1455 12/08/16 1657       6 Minute Walk   Phase Initial Discharge    Distance 1084 feet 1320 feet    Distance % Change  - 21.7 %    Walk Time 6 minutes 6 minutes    # of Rest Breaks 0 0    MPH 2.05 2.5    METS 2.57 2.91    RPE 13 14    VO2 Peak 6.43 8.4    Symptoms No Yes (comment)    Comments  - "burning" in throat,(not new during exercise) "fist" at lower sternum - sees Dr soon     Resting HR 72 bpm 94 bpm    Resting BP 126/64 132/64    Max Ex. HR 102 bpm 110 bpm    Max Ex. BP 134/84 144/60    2 Minute Post BP 126/62  -       Initial Exercise Prescription:     Initial Exercise Prescription - 09/11/16 1400      Date of Initial Exercise RX and Referring Provider   Date 09/11/16   Referring Provider Serafina Royals MD     Treadmill   MPH 1.5   Grade 0.5   Minutes 15   METs 2.25     NuStep   Level 1   Watts --  80-100 spm   Minutes 15   METs 2     T5 Nustep   Level 1   Watts --  80-100 spm   Minutes 15   METs 2     Prescription Details   Frequency (times per week) 3   Duration Progress to 45 minutes of aerobic exercise without signs/symptoms of physical distress     Intensity   THRR 40-80% of Max Heartrate 108-135   Ratings of Perceived Exertion 11-15   Perceived Dyspnea 0-4     Progression   Progression Continue to progress workloads to maintain  intensity without signs/symptoms of physical distress.     Resistance Training  Training Prescription Yes   Weight 3 lbs   Reps 10-12      Perform Capillary Blood Glucose checks as needed.  Exercise Prescription Changes:     Exercise Prescription Changes    Row Name 09/11/16 1400 09/18/16 1100 10/01/16 1300 10/09/16 1000 10/16/16 1000     Exercise Review   Progression -  walk test results  - Yes  - Yes     Response to Exercise   Blood Pressure (Admit) 126/64 146/42 142/70  - 126/68   Blood Pressure (Exercise) 134/84 158/80 148/72  - 140/64   Blood Pressure (Exit) 126/62 132/80 148/60  - 136/64   Heart Rate (Admit) 76 bpm  - 81 bpm  - 94 bpm   Heart Rate (Exercise) 104 bpm 96 bpm 113 bpm  - 105 bpm   Heart Rate (Exit) 88 bpm 80 bpm 87 bpm  - 86 bpm   Rating of Perceived Exertion (Exercise) _0 - 14   Symptoms none none  -  - none   Duration  -  - Progress to 45 minutes of aerobic exercise without signs/symptoms of physical distress  - Progress to 45 minutes of aerobic exercise without signs/symptoms of physical distress   Intensity  -  - THRR unchanged  - THRR unchanged     Progression   Progression  -  - Continue to progress workloads to maintain intensity without signs/symptoms of physical distress.  - Continue to progress workloads to maintain intensity without signs/symptoms of physical distress.   Average METs  - 1.8 2.2  - 2.25     Resistance Training   Training Prescription  - Yes Yes  - Yes   Weight  - 3 3  - 3   Reps  - 10-12 10-12  - 10-12     Interval Training   Interval Training  - No No  - No     Treadmill   MPH  -  - 1.5  -  -   Grade  -  - 0.5  -  -   Minutes  -  - 15  -  -   METs  -  - 2.25  -  -     NuStep   Level  - 1 1  - 1   Minutes  - 15 15  - 15   METs  - 1.7 2.1  - 2.4     T5 Nustep   Level  - 1  -  - 1   Minutes  - 15  -  - 15   METs  - 1.9  -  - 2.1     Home Exercise Plan   Plans to continue exercise at  -  -  - Home  -    Frequency  -  -  - Add 1 additional day to program exercise sessions.  walking driveway, possibly join FF or Y with his wife  -   Row Name 10/31/16 0900 11/13/16 1200 11/27/16 1100 12/11/16 1100       Exercise Review   Progression Yes  -  -  -      Response to Exercise   Blood Pressure (Admit) 132/64 122/70 124/66 144/62    Blood Pressure (Exercise) 148/80 128/70 142/70 142/64    Blood Pressure (Exit) 138/70 120/80 148/58 130/72    Heart Rate (Admit) 80 bpm 92 bpm 72 bpm 87 bpm    Heart Rate (Exercise) 120 bpm 113 bpm 107  bpm 105 bpm    Heart Rate (Exit) 91 bpm 80 bpm 83 bpm 86 bpm    Rating of Perceived Exertion (Exercise) _0 Symptoms none none none  -    Comments Home Exercise Guidelines given 10/09/16  -  -  -    Duration Progress to 45 minutes of aerobic exercise without signs/symptoms of physical distress Progress to 45 minutes of aerobic exercise without signs/symptoms of physical distress Progress to 45 minutes of aerobic exercise without signs/symptoms of physical distress Progress to 45 minutes of aerobic exercise without signs/symptoms of physical distress    Intensity THRR unchanged THRR unchanged THRR unchanged THRR unchanged      Progression   Progression Continue to progress workloads to maintain intensity without signs/symptoms of physical distress. Continue to progress workloads to maintain intensity without signs/symptoms of physical distress. Continue to progress workloads to maintain intensity without signs/symptoms of physical distress. Continue to progress workloads to maintain intensity without signs/symptoms of physical distress.    Average METs 3.21  -  -  -      Resistance Training   Training Prescription Yes Yes Yes Yes    Weight _1 Reps 10-12 10-12 10-12 10-12      Interval Training   Interval Training No No No No      Treadmill   MPH 1.5  - 2 2    Grade 0.5  - 1 1    Minutes 15  - 15 15    METs 2.25  - 2.81 2.81      NuStep    Level 1 1  -  -    Minutes _2 METs 4.4 2.4 3.8 2.3      T5 Nustep   Level 4 4  -  -    Minutes 15 15  -  -    METs 3  -  -  -      Home Exercise Plan   Plans to continue exercise at Home  -  -  -    Frequency Add 1 additional day to program exercise sessions.  walking driveway, possibly join FF or Y with his wife  -  -  -       Exercise Comments:     Exercise Comments    Row Name 09/11/16 1458 09/18/16 1109 09/18/16 1721 10/01/16 1341 10/09/16 1713   Exercise Comments Lawrence [name] wants to be able to play with his great granddaughter. Lawrence [name] first day of exercise went well.   Lawrence [name] felt like his BG was low during exercise.  It was 117 and he continued the session and will recheck BG before leaving today. Lawrence [name] is working on keeping his BG within limits for pre and post exercise. Lawrence [name] asked for a BG check after cardio - it was 243 and BP was 134/68.  He has had a sinus infection and has a headache.  He said he felt ok to do stretching at end of session.   Lawrence [name] Name 10/16/16 1036 10/23/16 1736 10/31/16 0950 11/13/16 1233 11/27/16 1107   Exercise Comments Lawrence [name] is progressing well with exercise. Lawrence [name] felt BG was low after 13 min. on TM.  It was 79.  He was given glucose gel and had crackers and it was 109 15 min later. Lawrence [name] has continued to do well in rehab.  We will continue to monitor his progression. Lawrence [name] is  progressing well with exercise. Lawrence [name] is doing well with exercise.  Staff still monitors BG before and after as it has not been consistent.   Lawrence [name] Name 12/03/16 1706 12/11/16 1107 12/15/16 1821       Exercise Comments Lawrence [name] was feeling dizzy during exrecise.  BP and BG were normal so he finished the session.   Lawrence [name] is scheduled to have a stress test due to continued neck "burning".  His Dr allows him to continue exercise at this time. Lawrence [name] completed the program today. Will be exercising in a [employer] class        Discharge Exercise Prescription (Final Exercise  Prescription Changes):     Exercise Prescription Changes - 12/11/16 1100      Response to Exercise   Blood Pressure (Admit) 144/62   Blood Pressure (Exercise) 142/64   Blood Pressure (Exit) 130/72   Heart Rate (Admit) 87 bpm   Heart Rate (Exercise) 105 bpm   Heart Rate (Exit) 86 bpm   Rating of Perceived Exertion (Exercise) 14   Duration Progress to 45 minutes of aerobic exercise without signs/symptoms of physical distress   Intensity THRR unchanged     Progression   Progression Continue to progress workloads to maintain intensity without signs/symptoms of physical distress.     Resistance Training   Training Prescription Yes   Weight 3   Reps 10-12     Interval Training   Interval Training No     Treadmill   MPH 2   Grade 1   Minutes 15   METs 2.81     NuStep   Minutes 15   METs 2.3      Nutrition:  Target Goals: Understanding of nutrition guidelines, daily intake of sodium <1516m, cholesterol <2060m calories 30% from fat and 7% or less from saturated fats, daily to have 5 or more servings of fruits and vegetables.  Biometrics:     Pre Biometrics - 09/11/16 1459      Pre Biometrics   Height _0  (1.778 m)   Weight 295 lb (133.8 kg)   Waist Circumference 51.25 inches   Hip Circumference 55.5 inches   Waist to Hip Ratio 0.92 %   BMI (Calculated) 42.4   Single Leg Stand 1.5 seconds         Post Biometrics - 12/08/16 1656       Post  Biometrics   Height _1  (1.778 m)   Weight 296 lb (134.3 kg)   Waist Circumference 51 inches   Hip Circumference 55 inches   Waist to Hip Ratio 0.93 %   BMI (Calculated) 42.6   Single Leg Stand 2.47 seconds      Nutrition Therapy Plan and Nutrition Goals:     Nutrition Therapy & Goals - 12/01/16 1658      Nutrition Therapy   Diet Deferred RD appointment      Nutrition Discharge: Rate Your Plate Scores:     Nutrition Assessments - 12/15/16 1822      Rate Your Plate Scores   Post Score 65   Post  Score % 72.2 %      Nutrition Goals Re-Evaluation:     Nutrition Goals Re-Evaluation    [place] 12/10/16 1637             Personal Goal #1 Re-Evaluation   Personal Goal #1 Eating heart healthy food       Goal Progress Seen Yes       Comments GaDelmoas said  he has met with more dieticians and knows what he is suppose to eat since he has had diabetes for years          Psychosocial: Target Goals: Acknowledge presence or absence of depression, maximize coping skills, provide positive support system. Participant is able to verbalize types and ability to use techniques and skills needed for reducing stress and depression.  Initial Review & Psychosocial Screening:     Initial Psych Review & Screening - 09/24/16 1629      Initial Review   Current issues with Current Abuse or Neglect to Report      Quality of Life Scores:     Quality of Life - 12/12/16 1100      Quality of Life Scores   Health/Function Pre 25.25 %   Health/Function Post 10.53 %   Health/Function % Change -58.3 %   Socioeconomic Pre 24.29 %   Socioeconomic Post 21 %   Socioeconomic % Change  -13.54 %   Psych/Spiritual Pre 28.29 %   Psych/Spiritual Post 20.57 %   Psych/Spiritual % Change -27.29 %   Family Pre 30 %   Family Post 18 %   Family % Change -40 %   GLOBAL Pre 26.41 %   GLOBAL Post 15.79 %   GLOBAL % Change -40.21 %      PHQ-9: Recent Review Flowsheet Data    Depression screen [facility] 2/9 12/12/2016 09/11/2016 09/05/2016   Decreased Interest 1 0 0   Down, Depressed, Hopeless 1 1 0   PHQ - 2 Score 2 1 0   Altered sleeping 3 1 -   Tired, decreased energy 1 1 -   Change in appetite 1 0 -   Feeling bad or failure about yourself  1 1 -   Trouble concentrating 1 1 -   Moving slowly or fidgety/restless 1 1 -   Suicidal thoughts 0 0 -   PHQ-9 Score 10 6 -   Difficult doing work/chores Somewhat difficult Somewhat difficult -      Psychosocial Evaluation and Intervention:      Psychosocial Evaluation - 12/15/16 1736      Discharge Psychosocial Assessment & Intervention   Comments Counselor follow up prior to discharge with Lawrence. Laymon (Lawrence A) today.  His Quality of Life scores and PHQ-9 scores were not as positive as when he came into this program.  Lawrence. A shared that his health issues had "taken over" his life and he is struggling to cope with it all.  His spouse also has MS and her health is not great either.  Lawrence. A stated all of the medical procedures and tests being run have forced him to "look at my health more seriously," and now he realizes he needs to do something about it.  He has a stress test scheduled for next week; his cataract surgeries and his skin grafts have been difficult to heal; especially since he is a diabetic.  He states he reads and tries to think about the positives to cope.  He also states he "feels better and his blood sugar numbers are down" when he works out - so this has been helpful.  He plans to Continue with the [employer] class twice weekly and hopes at some point his spouse can join him in that program.  Lawrence. A & his spouse have been designated as "great grand-parents" to their neighbors new baby and this motivates him and gives him a new sense of purpose.  Counselor encouraged  Lawrence. A to continue his commitment to improved health with exercise and proper diet, and commended him on his progress to date.         Psychosocial Re-Evaluation:     Psychosocial Re-Evaluation    Row Name 09/24/16 1640 11/27/16 1356 12/01/16 1659 12/03/16 1711 12/10/16 1633     Psychosocial Re-Evaluation   Interventions Encouraged to attend Cardiac Rehabilitation for the exercise  - Encouraged to attend Cardiac Rehabilitation for the exercise  -  -   Comments Lawrence "Lawrence [name]" reports that his weight went back up to above 300 last year when his mother in law with dementia moved in with them. Mother in law was in a nursing home briefly but he reports she fell and had to  go to the [place]. Dept. so she came back home with them.  Lawrence [name] was sorry he was unable to attend Cardiac Rehab for 2 weeks due to having a skin graft on his tow.  Lawrence [name] continues to keep stress levels low by using coping skills to release stress if it occurs.   Expected outcome is that Lawrence [name] will continue to use his coping skills to keep any stress under control. Counselor follow up with Lawrence [name] reporting "not doing well today" due to his sugar levels being up and down and other health issues causing him some concern.  However, he admits that he does feel better after working out; although he has to "force" himself to come - he is seeing the benefits.  Counselor commended Lawrence [name] for his commitment to improving his health and to exercise more consistently.  He sees the Dr. next week to get results back from one of his tests and he is a little concerned about that.  Lawrence [name] will be graduating from this program in a few weeks and is looking into a follow up program to maintain his progress and continue healthy habits.   Lawrence [name] said he just met with his cardiologist and told him of his neck fullness so Lawrence [name] reports the MD ordered a stress test for him for Feb 24th. His wife is here today listening with him to the Stress Education being given by our Mental Health Counselor Lucianne Lei, MSW>    Continued Psychosocial Services Needed  -  - No  -  -      Vocational Rehabilitation: Provide vocational rehab assistance to qualifying candidates.   Vocational Rehab Evaluation & Intervention:     Vocational Rehab - 09/11/16 1448      Initial Vocational Rehab Evaluation & Intervention   Assessment shows need for Vocational Rehabilitation No      Education: Education Goals: Education classes will be provided on a weekly basis, covering required topics. Participant will state understanding/return demonstration of topics presented.  Learning Barriers/Preferences:     Learning Barriers/Preferences  - 09/11/16 1447      Learning Barriers/Preferences   Learning Barriers Sight   Learning Preferences None      Education Topics: General Nutrition Guidelines/Fats and Fiber: -Group instruction provided by verbal, written material, models and posters to present the general guidelines for heart healthy nutrition. Gives an explanation and review of dietary fats and fiber.   Controlling Sodium/Reading Food Labels: -Group verbal and written material supporting the discussion of sodium use in heart healthy nutrition. Review and explanation with models, verbal and written materials for utilization of the food label. Flowsheet Row Cardiac Rehab from 12/15/2016 in [facility] Cardiac and Pulmonary Rehab  Date  09/22/16  Educator  PI  Instruction Review Code  2- meets goals/outcomes      Exercise Physiology & Risk Factors: - Group verbal and written instruction with models to review the exercise physiology of the cardiovascular system and associated critical values. Details cardiovascular disease risk factors and the goals associated with each risk factor. Flowsheet Row Cardiac Rehab from 12/15/2016 in [facility] Cardiac and Pulmonary Rehab  Date  11/24/16  Educator  [facility]  Instruction Review Code  2- meets goals/outcomes      Aerobic Exercise & Resistance Training: - Gives group verbal and written discussion on the health impact of inactivity. On the components of aerobic and resistive training programs and the benefits of this training and how to safely progress through these programs. Flowsheet Row Cardiac Rehab from 12/15/2016 in [facility] Cardiac and Pulmonary Rehab  Date  11/26/16  Educator  Rema Fendt.  Instruction Review Code  2- meets goals/outcomes      Flexibility, Balance, General Exercise Guidelines: - Provides group verbal and written instruction on the benefits of flexibility and balance training programs. Provides general exercise guidelines with specific guidelines to those with heart or lung  disease. Demonstration and skill practice provided. Flowsheet Row Cardiac Rehab from 12/15/2016 in [facility] Cardiac and Pulmonary Rehab  Date  12/01/16  Educator  [facility]  Instruction Review Code  2- meets goals/outcomes      Stress Management: - Provides group verbal and written instruction about the health risks of elevated stress, cause of high stress, and healthy ways to reduce stress. Flowsheet Row Cardiac Rehab from 12/15/2016 in [facility] Cardiac and Pulmonary Rehab  Date  12/10/16  Educator  [facility]  Instruction Review Code  2- meets goals/outcomes      Depression: - Provides group verbal and written instruction on the correlation between heart/lung disease and depressed mood, treatment options, and the stigmas associated with seeking treatment. Flowsheet Row Cardiac Rehab from 12/15/2016 in [facility] Cardiac and Pulmonary Rehab  Date  09/17/16  Educator  Berle Mull, MSW  Instruction Review Code  2- meets goals/outcomes      Anatomy & Physiology of the Heart: - Group verbal and written instruction and models provide basic cardiac anatomy and physiology, with the coronary electrical and arterial systems. Review of: AMI, Angina, Valve disease, Heart Failure, Cardiac Arrhythmia, Pacemakers, and the ICD. Flowsheet Row Cardiac Rehab from 12/15/2016 in [facility] Cardiac and Pulmonary Rehab  Date  12/08/16  Educator  C. Donovan  Instruction Review Code  2- meets goals/outcomes      Cardiac Procedures: - Group verbal and written instruction and models to describe the testing methods done to diagnose heart disease. Reviews the outcomes of the test results. Describes the treatment choices: Medical Management, Angioplasty, or Coronary Bypass Surgery. Flowsheet Row Cardiac Rehab from 12/15/2016 in [facility] Cardiac and Pulmonary Rehab  Date  12/15/16  Educator  CE  Instruction Review Code  2- meets goals/outcomes      Cardiac Medications: - Group verbal and written instruction to review commonly prescribed  medications for heart disease. Reviews the medication, class of the drug, and side effects. Includes the steps to properly store meds and maintain the prescription regimen. Flowsheet Row Cardiac Rehab from 12/15/2016 in [facility] Cardiac and Pulmonary Rehab  Date  10/22/16 Marisue Humble 2]  Educator  SB  Instruction Review Code  2- meets goals/outcomes      Go Sex-Intimacy & Heart Disease, Get SMART - Goal Setting: - Group verbal and written instruction through game format to discuss heart disease and the  return to sexual intimacy. Provides group verbal and written material to discuss and apply goal setting through the application of the S.M.A.R.T. Method. Flowsheet Row Cardiac Rehab from 12/15/2016 in [facility] Cardiac and Pulmonary Rehab  Date  12/15/16  Educator  CE  Instruction Review Code  2- meets goals/outcomes      Other Matters of the Heart: - Provides group verbal, written materials and models to describe Heart Failure, Angina, Valve Disease, and Diabetes in the realm of heart disease. Includes description of the disease process and treatment options available to the cardiac patient. Flowsheet Row Cardiac Rehab from 12/15/2016 in [facility] Cardiac and Pulmonary Rehab  Date  12/08/16  Educator  C. [place]  Instruction Review Code  2- meets goals/outcomes      Exercise & Equipment Safety: - Individual verbal instruction and demonstration of equipment use and safety with use of the equipment. Flowsheet Row Cardiac Rehab from 12/15/2016 in [facility] Cardiac and Pulmonary Rehab  Date  09/11/16  Educator  SB  Instruction Review Code  2- meets goals/outcomes      Infection Prevention: - Provides verbal and written material to individual with discussion of infection control including proper hand washing and proper equipment cleaning during exercise session. Flowsheet Row Cardiac Rehab from 12/15/2016 in [facility] Cardiac and Pulmonary Rehab  Date  09/11/16  Educator  SB  Instruction Review Code  2- meets  goals/outcomes      Falls Prevention: - Provides verbal and written material to individual with discussion of falls prevention and safety. Flowsheet Row Cardiac Rehab from 12/15/2016 in [facility] Cardiac and Pulmonary Rehab  Date  09/11/16  Educator  SB  Instruction Review Code  2- meets goals/outcomes      Diabetes: - Individual verbal and written instruction to review signs/symptoms of diabetes, desired ranges of glucose level fasting, after meals and with exercise. Advice that pre and post exercise glucose checks will be done for 3 sessions at entry of program. Flowsheet Row Cardiac Rehab from 12/15/2016 in [facility] Cardiac and Pulmonary Rehab  Date  09/11/16  Educator  SB  Instruction Review Code  2- meets goals/outcomes       Knowledge Questionnaire Score:     Knowledge Questionnaire Score - 12/12/16 1100      Knowledge Questionnaire Score   Pre Score 20/28   Post Score 18/28      Core Components/Risk Factors/Patient Goals at Admission:     Personal Goals and Risk Factors at Admission - 09/11/16 1432      Core Components/Risk Factors/Patient Goals on Admission    Weight Management Obesity;Weight Maintenance;Yes   Admit Weight 295 lb (133.8 kg)   Goal Weight: Short Term 290 lb (131.5 kg)   Goal Weight: Long Term 220 lb (99.8 kg)   Expected Outcomes Short Term: Continue to assess and modify interventions until short term weight is achieved;Long Term: Adherence to nutrition and physical activity/exercise program aimed toward attainment of established weight goal   Sedentary Yes   Intervention Provide advice, education, support and counseling about physical activity/exercise needs.;Develop an individualized exercise prescription for aerobic and resistive training based on initial evaluation findings, risk stratification, comorbidities and participant's personal goals.   Expected Outcomes Achievement of increased cardiorespiratory fitness and enhanced flexibility, muscular  endurance and strength shown through measurements of functional capacity and personal statement of participant.   Increase Strength and Stamina Yes   Intervention Provide advice, education, support and counseling about physical activity/exercise needs.;Develop an individualized exercise prescription for aerobic and resistive training  based on initial evaluation findings, risk stratification, comorbidities and participant's personal goals.   Expected Outcomes Achievement of increased cardiorespiratory fitness and enhanced flexibility, muscular endurance and strength shown through measurements of functional capacity and personal statement of participant.   Diabetes Yes   Intervention Provide education about signs/symptoms and action to take for hypo/hyperglycemia.;Provide education about proper nutrition, including hydration, and aerobic/resistive exercise prescription along with prescribed medications to achieve blood glucose in normal ranges: Fasting glucose 65-99 mg/dL   Expected Outcomes Short Term: Participant verbalizes understanding of the signs/symptoms and immediate care of hyper/hypoglycemia, proper foot care and importance of medication, aerobic/resistive exercise and nutrition plan for blood glucose control.;Long Term: Attainment of HbA1C < 7%.   Hypertension Yes   Intervention Provide education on lifestyle modifcations including regular physical activity/exercise, weight management, moderate sodium restriction and increased consumption of fresh fruit, vegetables, and low fat dairy, alcohol moderation, and smoking cessation.;Monitor prescription use compliance.   Expected Outcomes Short Term: Continued assessment and intervention until BP is < 140/64m HG in hypertensive participants. < 130/846mHG in hypertensive participants with diabetes, heart failure or chronic kidney disease.;Long Term: Maintenance of blood pressure at goal levels.   Lipids Yes  Statins cause muscle pain.   Crestor is new  and so far doing well taking the Crestor   Intervention Provide education and support for participant on nutrition & aerobic/resistive exercise along with prescribed medications to achieve LDL <7021mHDL >42m54m Expected Outcomes Short Term: Participant states understanding of desired cholesterol values and is compliant with medications prescribed. Participant is following exercise prescription and nutrition guidelines.;Long Term: Cholesterol controlled with medications as prescribed, with individualized exercise RX and with personalized nutrition plan. Value goals: LDL < 70mg80mL > 40 mg.      Core Components/Risk Factors/Patient Goals Review:      Goals and Risk Factor Review    Row Name 09/24/16 1643 10/23/16 1103 11/27/16 1353 12/01/16 1656 12/10/16 1634     Core Components/Risk Factors/Patient Goals Review   Personal Goals Review Weight Management/Obesity;Diabetes;Stress Weight Management/Obesity;Diabetes;Hypertension Sedentary;Diabetes Diabetes;Hypertension;Lipids Weight Management/Obesity;Sedentary;Diabetes   Review Lawrence Jupiterd his blood sugars have been so high at times but at 3pm today his blood sugar was 47 and he ate 3 small [place] candy bars which usually helps him but when he arrive at Cardiac REhab at 4pm his initial finger stick blood sugar was 57. Lawrence [name] ate 2 of our glucose tablets but said they dont help so given 2 Smartie candies.  Lawrence Jupiterrts his BG has been between 104-213 measuring at home.  His BP has been steady.  [facility] and doesn't feel he needs to meet with them here.  he feels the most important thing right now is regular exercise.  He was given info on the [employer] discussed the options for when he graduates. Lawrence [name] unable to attend Cardiac REhab due to a skin graft on his toe. Lawrence Jupiterclearnance to return "as long as he did not stub his toe'. Lawrence Jupiterbeen able to walk on the treadmill  since his return with no problems. [name]'s blood sugars have been flucuating. One time he asked me to check his blood sugar since he felt it was low and it was like 180. We check Wayn'es blood sugars frequenlty in Cardiac Rehab.  Lawrence Jupiteres he is compliant with his BP and cholesterol meds and he is attempting to follow the proper nutrition  plan.  Dawsen said his diabetes blood sugars for years have been up and down but he notices since he has been exercising his blood sugar have been better.    Expected Outcomes Main goal is to get blood sugars in appropriate range. Decreased weight at some point. Lawrence [name] will continue to develop regular exercise habits in Heart Track and will maintain after he graduates.  Regular exercise will help him control his risk factors and reduce risk of another CV event.  - Continue adherence to risk factor control  -   Row Name 12/10/16 1635             Core Components/Risk Factors/Patient Goals Review   Personal Goals Review Hypertension;Diabetes;Lipids       Review Lawrence [name]'s blood pressure has been better. His MD follows up with his lipid blood work and he is working on exercising and his diet to help that blood work look better.        Expected Outcomes To get healthier-heart healthier lifestyle.          Core Components/Risk Factors/Patient Goals at Discharge (Final Review):      Goals and Risk Factor Review - 12/10/16 1635      Core Components/Risk Factors/Patient Goals Review   Personal Goals Review Hypertension;Diabetes;Lipids   Review Ayuub's blood pressure has been better. His MD follows up with his lipid blood work and he is working on exercising and his diet to help that blood work look better.    Expected Outcomes To get healthier-heart healthier lifestyle.      ITP Comments:     ITP Comments    Row Name 09/11/16 1430 09/11/16 1450 09/24/16 1643 10/02/16 1758 10/06/16 1812   ITP Comments [employer] review completed today Initial ITP created.  Documentation of Diagnosis can be found in [facility]  Surgery 06/27/2016 Med[facility] review completed today Initial ITP created. Documentation of Diagnosis can be found in [facility]  Surgery 06/24/2016 [facility]" said he has seen so many dieticians over the years and his blood sugars have been way up and way down some days. He sees an Endocrine MD at the [place].  [place] felt symptomatic for low BG and checked it himself without informing staff.  He was eating a candy bar and it was advised to stop by staff.  He was given glucose tabs, BG was 106 upon recheck and he was given crackers before leaving. Clinton stsed he was experiencing pressure in his throat/chest / on pain scale. This occured while on the treadmill, he was slowed down and symptoms reduced/ He was stopped and the symptoms resolved.  He stated he was experiencing throat/sinus drainage today and felt stuffed up. He was advised to call his PMd and his cardiololgist to report the various symptoms. He had experienced the throat discomfort over the weekend at times too.    Row Name 10/08/16 1017 10/30/16 1655 11/04/16 0631 12/01/16 1739 12/03/16 0630   ITP Comments 30 day review completed for review by Dr Emily Filbert.  Continue with ITP unless changes noted by Dr Sabra Heck. "Lawrence [name]" Josean said he has had a rough year this past year with carpel tunnel syndrom surgery and other medical problems. Lawrence [name] said he didn't feel well today after exercising for about 10  minutes on the Nustep. His blood sugar was 125 and his blood pressure was stable at 140/80.  30 day review. Continue with ITP unless directed changes per Medical Director review. [place] complaioned of burning sensation in his throat during exercise  today. He was asked to slow downa nd the symptoms resolved with less workload. He was advised to keep his workload at the lower level.  No more symptoms occured today.  Review with Lawrence [name] found that he has had the symptoms prior to today with exertion. He has been advised to talk  to his physician about the symptoms. He was also advised to call 911 if symptoms occur and do not resolve with rest.  He verbalized understanding of the advice provided today 30 day review. Continue with ITP unless directed changes per Medical Director review.   [place] Name 12/10/16 1638 12/15/16 1823         ITP Comments Preciliano said he told the MD today that he has been having neck fullness which stops when he stops exercising. Nettie said his MD scheduled him for a stress test that first available Feb 24th. I instructed him during exericse here to stop and let us know if he has that neck fullness that does not go away. Stryker said he does have NTG but he has not had to take one since rest stops his neck fullness. I instructed Yacoub Diltz" to call 911 if he is not here and it does not go away. Discharged today after completion of the program.  Will join [employer].         Comments:

## 2016-12-15 NOTE — Progress Notes (Signed)
Daily Session Note  Patient Details  Name: ASUNCION SHIBATA MRN: [id number] Date of Birth: 1951-06-09 Referring Provider:   Flowsheet Row Cardiac Rehab from 09/11/2016 in [facility] Cardiac and Pulmonary Rehab  Referring Provider  Serafina Royals MD      Encounter Date: 12/15/2016  Check In:     Session Check In - 12/15/16 1639      Check-In   Staff Present Nyoka Cowden, RN, BSN, MA;Susanne Bice, RN, BSN, CCRP;Carroll Enterkin, RN, BSN   Supervising physician immediately available to respond to emergencies See telemetry face sheet for immediately available ER MD   Medication changes reported     No   Fall or balance concerns reported    No   Warm-up and Cool-down Performed on first and last piece of equipment   Resistance Training Performed Yes   VAD Patient? No     Pain Assessment   Currently in Pain? No/denies         Goals Met:  Exercise tolerated well No report of cardiac concerns or symptoms Strength training completed today  Goals Unmet:  Not Applicable  Comments: Doing well with exercise prescription progression. [name] completed the program today. Will be exercising in a [employer] class   Dr. Emily Filbert is Medical Director for [place] and [facility].

## 2016-12-17 DIAGNOSIS — R0789 Other chest pain: Secondary | ICD-10-CM | POA: Diagnosis not present

## 2016-12-17 NOTE — Progress Notes (Signed)
Discharge Summary  Patient Details  Name: Lawrence [name] MRN: [id number] Date of Birth: 1951-10-05 Referring Provider:   Flowsheet Row Cardiac Rehab from 09/11/2016 in [facility]  Referring Provider  Serafina Royals MD       Number of Visits: 36/36  Reason for Discharge:  Patient reached a stable level of exercise. Patient independent in their exercise.  Smoking History:  History  Smoking Status  . Former Smoker  . Packs/day: 1.00  . Years: 16.00  . Types: Cigarettes  . Start date: 11/03/1969  . Quit date: 07/22/1986  Smokeless Tobacco  . Never Used    Diagnosis:  S/P CABG x 4  ADL UCSD:   Initial Exercise Prescription:     Initial Exercise Prescription - 09/11/16 1400      Date of Initial Exercise RX and Referring Provider   Date 09/11/16   Referring Provider Serafina Royals MD     Treadmill   MPH 1.5   Grade 0.5   Minutes 15   METs 2.25     NuStep   Level 1   Watts --  80-100 spm   Minutes 15   METs 2     T5 Nustep   Level 1   Watts --  80-100 spm   Minutes 15   METs 2     Prescription Details   Frequency (times per week) 3   Duration Progress to 45 minutes of aerobic exercise without signs/symptoms of physical distress     Intensity   THRR 40-80% of Max Heartrate 108-135   Ratings of Perceived Exertion 11-15   Perceived Dyspnea 0-4     Progression   Progression Continue to progress workloads to maintain intensity without signs/symptoms of physical distress.     Resistance Training   Training Prescription Yes   Weight 3 lbs   Reps 10-12      Discharge Exercise Prescription (Final Exercise Prescription Changes):     Exercise Prescription Changes - 12/11/16 1100      Response to Exercise   Blood Pressure (Admit) 144/62   Blood Pressure (Exercise) 142/64   Blood Pressure (Exit) 130/72   Heart Rate (Admit) 87 bpm   Heart Rate (Exercise) 105 bpm   Heart Rate (Exit) 86 bpm   Rating of Perceived Exertion  (Exercise) 14   Duration Progress to 45 minutes of aerobic exercise without signs/symptoms of physical distress   Intensity THRR unchanged     Progression   Progression Continue to progress workloads to maintain intensity without signs/symptoms of physical distress.     Resistance Training   Training Prescription Yes   Weight 3   Reps 10-12     Interval Training   Interval Training No     Treadmill   MPH 2   Grade 1   Minutes 15   METs 2.81     NuStep   Minutes 15   METs 2.3      Functional Capacity:     6 Minute Walk    Row Name 09/11/16 1455 12/08/16 1657       6 Minute Walk   Phase Initial Discharge    Distance 1084 feet 1320 feet    Distance % Change  - 21.7 %    Walk Time 6 minutes 6 minutes    # of Rest Breaks 0 0    MPH 2.05 2.5    METS 2.57 2.91    RPE 13 14    VO2 Peak 6.43  8.4    Symptoms No Yes (comment)    Comments  - "burning" in throat,(not new during exercise) "fist" at lower sternum - sees Dr soon     Resting HR 72 bpm 94 bpm    Resting BP 126/64 132/64    Max Ex. HR 102 bpm 110 bpm    Max Ex. BP 134/84 144/60    2 Minute Post BP 126/62  -       Psychological, QOL, Others - Outcomes: PHQ 2/9: Depression screen [facility] 2/9 12/12/2016 09/11/2016 09/05/2016  Decreased Interest 1 0 0  Down, Depressed, Hopeless 1 1 0  PHQ - 2 Score 2 1 0  Altered sleeping 3 1 -  Tired, decreased energy 1 1 -  Change in appetite 1 0 -  Feeling bad or failure about yourself  1 1 -  Trouble concentrating 1 1 -  Moving slowly or fidgety/restless 1 1 -  Suicidal thoughts 0 0 -  PHQ-9 Score 10 6 -  Difficult doing work/chores Somewhat difficult Somewhat difficult -    Quality of Life:     Quality of Life - 12/12/16 1100      Quality of Life Scores   Health/Function Pre 25.25 %   Health/Function Post 10.53 %   Health/Function % Change -58.3 %   Socioeconomic Pre 24.29 %   Socioeconomic Post 21 %   Socioeconomic % Change  -13.54 %   Psych/Spiritual Pre  28.29 %   Psych/Spiritual Post 20.57 %   Psych/Spiritual % Change -27.29 %   Family Pre 30 %   Family Post 18 %   Family % Change -40 %   GLOBAL Pre 26.41 %   GLOBAL Post 15.79 %   GLOBAL % Change -40.21 %      Personal Goals: Goals established at orientation with interventions provided to work toward goal.     Personal Goals and Risk Factors at Admission - 09/11/16 1432      Core Components/Risk Factors/Patient Goals on Admission    Weight Management Obesity;Weight Maintenance;Yes   Admit Weight 295 lb (133.8 kg)   Goal Weight: Short Term 290 lb (131.5 kg)   Goal Weight: Long Term 220 lb (99.8 kg)   Expected Outcomes Short Term: Continue to assess and modify interventions until short term weight is achieved;Long Term: Adherence to nutrition and physical activity/exercise program aimed toward attainment of established weight goal   Sedentary Yes   Intervention Provide advice, education, support and counseling about physical activity/exercise needs.;Develop an individualized exercise prescription for aerobic and resistive training based on initial evaluation findings, risk stratification, comorbidities and participant's personal goals.   Expected Outcomes Achievement of increased cardiorespiratory fitness and enhanced flexibility, muscular endurance and strength shown through measurements of functional capacity and personal statement of participant.   Increase Strength and Stamina Yes   Intervention Provide advice, education, support and counseling about physical activity/exercise needs.;Develop an individualized exercise prescription for aerobic and resistive training based on initial evaluation findings, risk stratification, comorbidities and participant's personal goals.   Expected Outcomes Achievement of increased cardiorespiratory fitness and enhanced flexibility, muscular endurance and strength shown through measurements of functional capacity and personal statement of participant.    Diabetes Yes   Intervention Provide education about signs/symptoms and action to take for hypo/hyperglycemia.;Provide education about proper nutrition, including hydration, and aerobic/resistive exercise prescription along with prescribed medications to achieve blood glucose in normal ranges: Fasting glucose 65-99 mg/dL   Expected Outcomes Short Term: Participant verbalizes understanding of the  signs/symptoms and immediate care of hyper/hypoglycemia, proper foot care and importance of medication, aerobic/resistive exercise and nutrition plan for blood glucose control.;Long Term: Attainment of HbA1C < 7%.   Hypertension Yes   Intervention Provide education on lifestyle modifcations including regular physical activity/exercise, weight management, moderate sodium restriction and increased consumption of fresh fruit, vegetables, and low fat dairy, alcohol moderation, and smoking cessation.;Monitor prescription use compliance.   Expected Outcomes Short Term: Continued assessment and intervention until BP is < 140/70m HG in hypertensive participants. < 130/886mHG in hypertensive participants with diabetes, heart failure or chronic kidney disease.;Long Term: Maintenance of blood pressure at goal levels.   Lipids Yes  Statins cause muscle pain.   Crestor is new and so far doing well taking the Crestor   Intervention Provide education and support for participant on nutrition & aerobic/resistive exercise along with prescribed medications to achieve LDL <7050mHDL >51m63m Expected Outcomes Short Term: Participant states understanding of desired cholesterol values and is compliant with medications prescribed. Participant is following exercise prescription and nutrition guidelines.;Long Term: Cholesterol controlled with medications as prescribed, with individualized exercise RX and with personalized nutrition plan. Value goals: LDL < 70mg41mL > 40 mg.       Personal Goals Discharge:     Goals and Risk  Factor Review    Row Name 09/24/16 1643 10/23/16 1103 11/27/16 1353 12/01/16 1656 12/10/16 1634     Core Components/Risk Factors/Patient Goals Review   Personal Goals Review Weight Management/Obesity;Diabetes;Stress Weight Management/Obesity;Diabetes;Hypertension Sedentary;Diabetes Diabetes;Hypertension;Lipids Weight Management/Obesity;Sedentary;Diabetes   Review GarlaMuminnePatrick Jupiterd his blood sugars have been so high at times but at 3pm today his blood sugar was 47 and he ate 3 small [place] candy bars which usually helps him but when he arrive at Cardiac REhab at 4pm his initial finger stick blood sugar was 57. Sharif ate 2 of our glucose tablets but said they dont help so given 2 Smartie candies.  WaynePatrick Jupiterrts his BG has been between 104-213 measuring at home.  His BP has been steady.  [facility] and doesn't feel he needs to meet with them here.  he feels the most important thing right now is regular exercise.  He was given info on the [employer] discussed the options for when he graduates. GarlaJayvonnePatrick Jupiter unable to attend Cardiac REhab due to a skin graft on his toe. WaynePatrick Jupiterclearnance to return "as long as he did not stub his toe'. WaynePatrick Jupiterbeen able to walk on the treadmill since his return with no problems. [name]'s blood sugars have been flucuating. One time he asked me to check his blood sugar since he felt it was low and it was like 180. We check Wayn'es blood sugars frequenlty in Cardiac Rehab.  WaynePatrick Jupiteres he is compliant with his BP and cholesterol meds and he is attempting to follow the proper nutrition plan.  Batu said his diabetes blood sugars for years have been up and down but he notices since he has been exercising his blood sugar have been better.    Expected Outcomes Main goal is to get blood sugars in appropriate range. Decreased weight at some point. WaynePatrick Jupiter continue to develop regular exercise habits in [facility] and will  maintain after he graduates.  Regular exercise will help him control his risk factors and reduce risk of another CV event.  - Continue adherence to risk factor control  -  Shelby Name 12/10/16 1635             Core Components/Risk Factors/Patient Goals Review   Personal Goals Review Hypertension;Diabetes;Lipids       Review Diane's blood pressure has been better. His MD follows up with his lipid blood work and he is working on exercising and his diet to help that blood work look better.        Expected Outcomes To get healthier-heart healthier lifestyle.          Nutrition & Weight - Outcomes:     Pre Biometrics - 09/11/16 1459      Pre Biometrics   Height '5\' 10"'  (1.778 m)   Weight 295 lb (133.8 kg)   Waist Circumference 51.25 inches   Hip Circumference 55.5 inches   Waist to Hip Ratio 0.92 %   BMI (Calculated) 42.4   Single Leg Stand 1.5 seconds         Post Biometrics - 12/08/16 1656       Post  Biometrics   Height '5\' 10"'  (1.778 m)   Weight 296 lb (134.3 kg)   Waist Circumference 51 inches   Hip Circumference 55 inches   Waist to Hip Ratio 0.93 %   BMI (Calculated) 42.6   Single Leg Stand 2.47 seconds      Nutrition:     Nutrition Therapy & Goals - 12/01/16 1658      Nutrition Therapy   Diet Deferred RD appointment      Nutrition Discharge:     Nutrition Assessments - 12/15/16 1822      Rate Your Plate Scores   Post Score 65   Post Score % 72.2 %      Education Questionnaire Score:     Knowledge Questionnaire Score - 12/12/16 1100      Knowledge Questionnaire Score   Pre Score 20/28   Post Score 18/28      Goals reviewed with patient; copy given to patient.

## 2016-12-17 NOTE — Progress Notes (Signed)
Cardiac Individual Treatment Plan  Patient Details  Name: COLTER MAGOWAN MRN: [id number] Date of Birth: 03/03/1951 Referring Provider:   Flowsheet Row Cardiac Rehab from 09/11/2016 in [facility] Cardiac and Pulmonary Rehab  Referring Provider  Serafina Royals MD      Initial Encounter Date:  Flowsheet Row Cardiac Rehab from 09/11/2016 in [facility] Cardiac and Pulmonary Rehab  Date  09/11/16  Referring Provider  Serafina Royals MD      Visit Diagnosis: S/P CABG x 4  Patient's Home Medications on Admission:  Current Outpatient Prescriptions:  .  aspirin 81 MG EC tablet, Take 81 mg by mouth daily. Swallow whole., Disp: , Rfl:  .  cholecalciferol (VITAMIN D) 1000 units tablet, Take 1,000 Units by mouth daily., Disp: , Rfl:  .  docusate sodium (COLACE) 100 MG capsule, Take 200 mg by mouth 2 (two) times daily., Disp: , Rfl:  .  furosemide (LASIX) 40 MG tablet, Take 1 tablet (40 mg total) by mouth daily., Disp: 30 tablet, Rfl: 1 .  glucagon (GLUCAGON EMERGENCY) 1 MG injection, Inject 1 mg into the vein once as needed. Reported on 02/11/2016, Disp: , Rfl:  .  insulin aspart (NOVOLOG) 100 UNIT/ML injection, Inject 20 Units into the skin 3 (three) times daily. (Patient taking differently: Inject 20 Units into the skin 3 (three) times daily with meals. ), Disp: 10 mL, Rfl: 5 .  insulin glargine (LANTUS) 100 UNIT/ML injection, Inject 20 Units into the skin at bedtime. , Disp: , Rfl:  .  latanoprost (XALATAN) 0.005 % ophthalmic solution, Place 1 drop into both eyes at bedtime., Disp: , Rfl:  .  loratadine (CLARITIN) 10 MG tablet, Take 10 mg by mouth daily., Disp: , Rfl:  .  metoprolol tartrate (LOPRESSOR) 25 MG tablet, Take 25 mg by mouth 2 (two) times daily., Disp: , Rfl:  .  Multiple Vitamin (MULTIVITAMIN) capsule, Take 1 capsule by mouth daily., Disp: , Rfl:  .  mupirocin ointment (BACTROBAN) 2 %, Apply 1 application topically 2 (two) times daily., Disp: 22 g, Rfl: 0 .  potassium chloride SA  (K-DUR,KLOR-CON) 20 MEQ tablet, Take 1 tablet (20 mEq total) by mouth daily., Disp: 30 tablet, Rfl: 1 .  rosuvastatin (CRESTOR) 20 MG tablet, Take 1 tablet (20 mg total) by mouth daily at 6 PM., Disp: 30 tablet, Rfl: 1 .  sulfamethoxazole-trimethoprim (BACTRIM DS,SEPTRA DS) 800-160 MG tablet, Take 1 tablet by mouth 2 (two) times daily., Disp: 20 tablet, Rfl: 0 .  vitamin C (ASCORBIC ACID) 500 MG tablet, Take 500 mg by mouth 2 (two) times daily. , Disp: , Rfl:   Past Medical History: Past Medical History:  Diagnosis Date  . CAD (coronary artery disease)    07/2007  . Diabetes ([place])   . Elevated lipids   . Glaucoma   . HBP (high blood pressure)   . Neuropathy ([place])   . Osteoarthritis   . Skin cancer   . Sleep apnea    CPAP    Tobacco Use: History  Smoking Status  . Former Smoker  . Packs/day: 1.00  . Years: 16.00  . Types: Cigarettes  . Start date: 11/03/1969  . Quit date: 07/22/1986  Smokeless Tobacco  . Never Used    Labs: Recent Review Flowsheet Data    Labs for ITP Cardiac and Pulmonary Rehab Latest Ref Rng & Units 06/27/2016 06/27/2016 06/27/2016 06/28/2016 09/08/2016   Cholestrol 0 - 200 mg/dL - - - - 164   LDLCALC 0 - 99 mg/dL - - - -  69   HDL >39.00 mg/dL - - - - 81.00   Trlycerides 0.0 - 149.0 mg/dL - - - - 69.0   Hemoglobin A1c 4.6 - 6.5 % - - - - 7.9(H)   PHART 7.350 - 7.450 7.372 - 7.390 - -   PCO2ART 35.0 - 45.0 mmHg 46.1(H) - 45.2(H) - -   HCO3 20.0 - 24.0 mEq/L 26.8(H) - 27.3(H) - -   TCO2 0 - 100 mmol/L _0 -   O2SAT % 97.0 - 96.0 - -       Exercise Target Goals:    Exercise Program Goal: Individual exercise prescription set with THRR, safety & activity barriers. Participant demonstrates ability to understand and report RPE using BORG scale, to self-measure pulse accurately, and to acknowledge the importance of the exercise prescription.  Exercise Prescription Goal: Starting with aerobic activity 30 plus minutes a day, 3 days per week for  initial exercise prescription. Provide home exercise prescription and guidelines that participant acknowledges understanding prior to discharge.  Activity Barriers & Risk Stratification:     Activity Barriers & Cardiac Risk Stratification - 09/11/16 1446      Activity Barriers & Cardiac Risk Stratification   Activity Barriers Left Knee Replacement;Arthritis;Muscular Weakness;Deconditioning;Assistive Device;History of Falls;Balance Concerns;Other (comment)   Comments neuropathy in both legs and feet   Cardiac Risk Stratification High      6 Minute Walk:     6 Minute Walk    Row Name 09/11/16 1455 12/08/16 1657       6 Minute Walk   Phase Initial Discharge    Distance 1084 feet 1320 feet    Distance % Change  - 21.7 %    Walk Time 6 minutes 6 minutes    # of Rest Breaks 0 0    MPH 2.05 2.5    METS 2.57 2.91    RPE 13 14    VO2 Peak 6.43 8.4    Symptoms No Yes (comment)    Comments  - "burning" in throat,(not new during exercise) "fist" at lower sternum - sees Dr soon     Resting HR 72 bpm 94 bpm    Resting BP 126/64 132/64    Max Ex. HR 102 bpm 110 bpm    Max Ex. BP 134/84 144/60    2 Minute Post BP 126/62  -       Initial Exercise Prescription:     Initial Exercise Prescription - 09/11/16 1400      Date of Initial Exercise RX and Referring Provider   Date 09/11/16   Referring Provider Serafina Royals MD     Treadmill   MPH 1.5   Grade 0.5   Minutes 15   METs 2.25     NuStep   Level 1   Watts --  80-100 spm   Minutes 15   METs 2     T5 Nustep   Level 1   Watts --  80-100 spm   Minutes 15   METs 2     Prescription Details   Frequency (times per week) 3   Duration Progress to 45 minutes of aerobic exercise without signs/symptoms of physical distress     Intensity   THRR 40-80% of Max Heartrate 108-135   Ratings of Perceived Exertion 11-15   Perceived Dyspnea 0-4     Progression   Progression Continue to progress workloads to maintain  intensity without signs/symptoms of physical distress.     Resistance Training  Training Prescription Yes   Weight 3 lbs   Reps 10-12      Perform Capillary Blood Glucose checks as needed.  Exercise Prescription Changes:     Exercise Prescription Changes    Row Name 09/11/16 1400 09/18/16 1100 10/01/16 1300 10/09/16 1000 10/16/16 1000     Exercise Review   Progression -  walk test results  - Yes  - Yes     Response to Exercise   Blood Pressure (Admit) 126/64 146/42 142/70  - 126/68   Blood Pressure (Exercise) 134/84 158/80 148/72  - 140/64   Blood Pressure (Exit) 126/62 132/80 148/60  - 136/64   Heart Rate (Admit) 76 bpm  - 81 bpm  - 94 bpm   Heart Rate (Exercise) 104 bpm 96 bpm 113 bpm  - 105 bpm   Heart Rate (Exit) 88 bpm 80 bpm 87 bpm  - 86 bpm   Rating of Perceived Exertion (Exercise) _0 - 14   Symptoms none none  -  - none   Duration  -  - Progress to 45 minutes of aerobic exercise without signs/symptoms of physical distress  - Progress to 45 minutes of aerobic exercise without signs/symptoms of physical distress   Intensity  -  - THRR unchanged  - THRR unchanged     Progression   Progression  -  - Continue to progress workloads to maintain intensity without signs/symptoms of physical distress.  - Continue to progress workloads to maintain intensity without signs/symptoms of physical distress.   Average METs  - 1.8 2.2  - 2.25     Resistance Training   Training Prescription  - Yes Yes  - Yes   Weight  - 3 3  - 3   Reps  - 10-12 10-12  - 10-12     Interval Training   Interval Training  - No No  - No     Treadmill   MPH  -  - 1.5  -  -   Grade  -  - 0.5  -  -   Minutes  -  - 15  -  -   METs  -  - 2.25  -  -     NuStep   Level  - 1 1  - 1   Minutes  - 15 15  - 15   METs  - 1.7 2.1  - 2.4     T5 Nustep   Level  - 1  -  - 1   Minutes  - 15  -  - 15   METs  - 1.9  -  - 2.1     Home Exercise Plan   Plans to continue exercise at  -  -  - Home  -    Frequency  -  -  - Add 1 additional day to program exercise sessions.  walking driveway, possibly join FF or Y with his wife  -   Row Name 10/31/16 0900 11/13/16 1200 11/27/16 1100 12/11/16 1100       Exercise Review   Progression Yes  -  -  -      Response to Exercise   Blood Pressure (Admit) 132/64 122/70 124/66 144/62    Blood Pressure (Exercise) 148/80 128/70 142/70 142/64    Blood Pressure (Exit) 138/70 120/80 148/58 130/72    Heart Rate (Admit) 80 bpm 92 bpm 72 bpm 87 bpm    Heart Rate (Exercise) 120 bpm 113 bpm 107  bpm 105 bpm    Heart Rate (Exit) 91 bpm 80 bpm 83 bpm 86 bpm    Rating of Perceived Exertion (Exercise) _0 Symptoms none none none  -    Comments Home Exercise Guidelines given 10/09/16  -  -  -    Duration Progress to 45 minutes of aerobic exercise without signs/symptoms of physical distress Progress to 45 minutes of aerobic exercise without signs/symptoms of physical distress Progress to 45 minutes of aerobic exercise without signs/symptoms of physical distress Progress to 45 minutes of aerobic exercise without signs/symptoms of physical distress    Intensity THRR unchanged THRR unchanged THRR unchanged THRR unchanged      Progression   Progression Continue to progress workloads to maintain intensity without signs/symptoms of physical distress. Continue to progress workloads to maintain intensity without signs/symptoms of physical distress. Continue to progress workloads to maintain intensity without signs/symptoms of physical distress. Continue to progress workloads to maintain intensity without signs/symptoms of physical distress.    Average METs 3.21  -  -  -      Resistance Training   Training Prescription Yes Yes Yes Yes    Weight _1 Reps 10-12 10-12 10-12 10-12      Interval Training   Interval Training No No No No      Treadmill   MPH 1.5  - 2 2    Grade 0.5  - 1 1    Minutes 15  - 15 15    METs 2.25  - 2.81 2.81      NuStep    Level 1 1  -  -    Minutes _2 METs 4.4 2.4 3.8 2.3      T5 Nustep   Level 4 4  -  -    Minutes 15 15  -  -    METs 3  -  -  -      Home Exercise Plan   Plans to continue exercise at Home  -  -  -    Frequency Add 1 additional day to program exercise sessions.  walking driveway, possibly join FF or Y with his wife  -  -  -       Exercise Comments:     Exercise Comments    Row Name 09/11/16 1458 09/18/16 1109 09/18/16 1721 10/01/16 1341 10/09/16 1713   Exercise Comments [name] wants to be able to play with his great granddaughter. Mr Wessell first day of exercise went well.   Reader felt like his BG was low during exercise.  It was 117 and he continued the session and will recheck BG before leaving today. Mr Stager is working on keeping his BG within limits for pre and post exercise. Mr Aldana asked for a BG check after cardio - it was 243 and BP was 134/68.  He has had a sinus infection and has a headache.  He said he felt ok to do stretching at end of session.   Maxwell Name 10/16/16 1036 10/23/16 1736 10/31/16 0950 11/13/16 1233 11/27/16 1107   Exercise Comments [name] is progressing well with exercise. Tate felt BG was low after 13 min. on TM.  It was 79.  He was given glucose gel and had crackers and it was 109 15 min later. [name] has continued to do well in rehab.  We will continue to monitor his progression. [name] is  progressing well with exercise. [name] is doing well with exercise.  Staff still monitors BG before and after as it has not been consistent.   Bagnell Name 12/03/16 1706 12/11/16 1107 12/15/16 1821       Exercise Comments [name] was feeling dizzy during exrecise.  BP and BG were normal so he finished the session.   [name] is scheduled to have a stress test due to continued neck "burning".  His Dr allows him to continue exercise at this time. [name] completed the program today. Will be exercising in a [employer] class        Discharge Exercise Prescription (Final Exercise  Prescription Changes):     Exercise Prescription Changes - 12/11/16 1100      Response to Exercise   Blood Pressure (Admit) 144/62   Blood Pressure (Exercise) 142/64   Blood Pressure (Exit) 130/72   Heart Rate (Admit) 87 bpm   Heart Rate (Exercise) 105 bpm   Heart Rate (Exit) 86 bpm   Rating of Perceived Exertion (Exercise) 14   Duration Progress to 45 minutes of aerobic exercise without signs/symptoms of physical distress   Intensity THRR unchanged     Progression   Progression Continue to progress workloads to maintain intensity without signs/symptoms of physical distress.     Resistance Training   Training Prescription Yes   Weight 3   Reps 10-12     Interval Training   Interval Training No     Treadmill   MPH 2   Grade 1   Minutes 15   METs 2.81     NuStep   Minutes 15   METs 2.3      Nutrition:  Target Goals: Understanding of nutrition guidelines, daily intake of sodium <1516m, cholesterol <2060m calories 30% from fat and 7% or less from saturated fats, daily to have 5 or more servings of fruits and vegetables.  Biometrics:     Pre Biometrics - 09/11/16 1459      Pre Biometrics   Height _0  (1.778 m)   Weight 295 lb (133.8 kg)   Waist Circumference 51.25 inches   Hip Circumference 55.5 inches   Waist to Hip Ratio 0.92 %   BMI (Calculated) 42.4   Single Leg Stand 1.5 seconds         Post Biometrics - 12/08/16 1656       Post  Biometrics   Height _1  (1.778 m)   Weight 296 lb (134.3 kg)   Waist Circumference 51 inches   Hip Circumference 55 inches   Waist to Hip Ratio 0.93 %   BMI (Calculated) 42.6   Single Leg Stand 2.47 seconds      Nutrition Therapy Plan and Nutrition Goals:     Nutrition Therapy & Goals - 12/01/16 1658      Nutrition Therapy   Diet Deferred RD appointment      Nutrition Discharge: Rate Your Plate Scores:     Nutrition Assessments - 12/15/16 1822      Rate Your Plate Scores   Post Score 65   Post  Score % 72.2 %      Nutrition Goals Re-Evaluation:     Nutrition Goals Re-Evaluation    [place] 12/10/16 1637             Personal Goal #1 Re-Evaluation   Personal Goal #1 Eating heart healthy food       Goal Progress Seen Yes       Comments GaDelmoas said  he has met with more dieticians and knows what he is suppose to eat since he has had diabetes for years          Psychosocial: Target Goals: Acknowledge presence or absence of depression, maximize coping skills, provide positive support system. Participant is able to verbalize types and ability to use techniques and skills needed for reducing stress and depression.  Initial Review & Psychosocial Screening:     Initial Psych Review & Screening - 09/24/16 1629      Initial Review   Current issues with Current Abuse or Neglect to Report      Quality of Life Scores:     Quality of Life - 12/12/16 1100      Quality of Life Scores   Health/Function Pre 25.25 %   Health/Function Post 10.53 %   Health/Function % Change -58.3 %   Socioeconomic Pre 24.29 %   Socioeconomic Post 21 %   Socioeconomic % Change  -13.54 %   Psych/Spiritual Pre 28.29 %   Psych/Spiritual Post 20.57 %   Psych/Spiritual % Change -27.29 %   Family Pre 30 %   Family Post 18 %   Family % Change -40 %   GLOBAL Pre 26.41 %   GLOBAL Post 15.79 %   GLOBAL % Change -40.21 %      PHQ-9: Recent Review Flowsheet Data    Depression screen [facility] 2/9 12/12/2016 09/11/2016 09/05/2016   Decreased Interest 1 0 0   Down, Depressed, Hopeless 1 1 0   PHQ - 2 Score 2 1 0   Altered sleeping 3 1 -   Tired, decreased energy 1 1 -   Change in appetite 1 0 -   Feeling bad or failure about yourself  1 1 -   Trouble concentrating 1 1 -   Moving slowly or fidgety/restless 1 1 -   Suicidal thoughts 0 0 -   PHQ-9 Score 10 6 -   Difficult doing work/chores Somewhat difficult Somewhat difficult -      Psychosocial Evaluation and Intervention:      Psychosocial Evaluation - 12/15/16 1736      Discharge Psychosocial Assessment & Intervention   Comments Counselor follow up prior to discharge with Mr. Laymon (Mr A) today.  His Quality of Life scores and PHQ-9 scores were not as positive as when he came into this program.  Mr. A shared that his health issues had "taken over" his life and he is struggling to cope with it all.  His spouse also has MS and her health is not great either.  Mr. A stated all of the medical procedures and tests being run have forced him to "look at my health more seriously," and now he realizes he needs to do something about it.  He has a stress test scheduled for next week; his cataract surgeries and his skin grafts have been difficult to heal; especially since he is a diabetic.  He states he reads and tries to think about the positives to cope.  He also states he "feels better and his blood sugar numbers are down" when he works out - so this has been helpful.  He plans to Continue with the [employer] class twice weekly and hopes at some point his spouse can join him in that program.  Mr. A & his spouse have been designated as "great grand-parents" to their neighbors new baby and this motivates him and gives him a new sense of purpose.  Counselor encouraged  Mr. A to continue his commitment to improved health with exercise and proper diet, and commended him on his progress to date.         Psychosocial Re-Evaluation:     Psychosocial Re-Evaluation    Row Name 09/24/16 1640 11/27/16 1356 12/01/16 1659 12/03/16 1711 12/10/16 1633     Psychosocial Re-Evaluation   Interventions Encouraged to attend Cardiac Rehabilitation for the exercise  - Encouraged to attend Cardiac Rehabilitation for the exercise  -  -   Comments Leevi "[name]" reports that his weight went back up to above 300 last year when his mother in law with dementia moved in with them. Mother in law was in a nursing home briefly but he reports she fell and had to  go to the [place]. Dept. so she came back home with them.  [name] was sorry he was unable to attend Cardiac Rehab for 2 weeks due to having a skin graft on his tow.  [name] continues to keep stress levels low by using coping skills to release stress if it occurs.   Expected outcome is that [name] will continue to use his coping skills to keep any stress under control. Counselor follow up with Mr. Ciotti reporting "not doing well today" due to his sugar levels being up and down and other health issues causing him some concern.  However, he admits that he does feel better after working out; although he has to "force" himself to come - he is seeing the benefits.  Counselor commended Mr. Wurtzel for his commitment to improving his health and to exercise more consistently.  He sees the Dr. next week to get results back from one of his tests and he is a little concerned about that.  Mr. Mazzoni will be graduating from this program in a few weeks and is looking into a follow up program to maintain his progress and continue healthy habits.   Sequoyah said he just met with his cardiologist and told him of his neck fullness so Nikalas reports the MD ordered a stress test for him for Feb 24th. His wife is here today listening with him to the Stress Education being given by our Mental Health Counselor Lucianne Lei, MSW>    Continued Psychosocial Services Needed  -  - No  -  -      Vocational Rehabilitation: Provide vocational rehab assistance to qualifying candidates.   Vocational Rehab Evaluation & Intervention:     Vocational Rehab - 09/11/16 1448      Initial Vocational Rehab Evaluation & Intervention   Assessment shows need for Vocational Rehabilitation No      Education: Education Goals: Education classes will be provided on a weekly basis, covering required topics. Participant will state understanding/return demonstration of topics presented.  Learning Barriers/Preferences:     Learning Barriers/Preferences  - 09/11/16 1447      Learning Barriers/Preferences   Learning Barriers Sight   Learning Preferences None      Education Topics: General Nutrition Guidelines/Fats and Fiber: -Group instruction provided by verbal, written material, models and posters to present the general guidelines for heart healthy nutrition. Gives an explanation and review of dietary fats and fiber.   Controlling Sodium/Reading Food Labels: -Group verbal and written material supporting the discussion of sodium use in heart healthy nutrition. Review and explanation with models, verbal and written materials for utilization of the food label. Flowsheet Row Cardiac Rehab from 12/15/2016 in [facility] Cardiac and Pulmonary Rehab  Date  09/22/16  Educator  PI  Instruction Review Code  2- meets goals/outcomes      Exercise Physiology & Risk Factors: - Group verbal and written instruction with models to review the exercise physiology of the cardiovascular system and associated critical values. Details cardiovascular disease risk factors and the goals associated with each risk factor. Flowsheet Row Cardiac Rehab from 12/15/2016 in [facility] Cardiac and Pulmonary Rehab  Date  11/24/16  Educator  [facility]  Instruction Review Code  2- meets goals/outcomes      Aerobic Exercise & Resistance Training: - Gives group verbal and written discussion on the health impact of inactivity. On the components of aerobic and resistive training programs and the benefits of this training and how to safely progress through these programs. Flowsheet Row Cardiac Rehab from 12/15/2016 in [facility] Cardiac and Pulmonary Rehab  Date  11/26/16  Educator  Rema Fendt.  Instruction Review Code  2- meets goals/outcomes      Flexibility, Balance, General Exercise Guidelines: - Provides group verbal and written instruction on the benefits of flexibility and balance training programs. Provides general exercise guidelines with specific guidelines to those with heart or lung  disease. Demonstration and skill practice provided. Flowsheet Row Cardiac Rehab from 12/15/2016 in [facility] Cardiac and Pulmonary Rehab  Date  12/01/16  Educator  [facility]  Instruction Review Code  2- meets goals/outcomes      Stress Management: - Provides group verbal and written instruction about the health risks of elevated stress, cause of high stress, and healthy ways to reduce stress. Flowsheet Row Cardiac Rehab from 12/15/2016 in [facility] Cardiac and Pulmonary Rehab  Date  12/10/16  Educator  [facility]  Instruction Review Code  2- meets goals/outcomes      Depression: - Provides group verbal and written instruction on the correlation between heart/lung disease and depressed mood, treatment options, and the stigmas associated with seeking treatment. Flowsheet Row Cardiac Rehab from 12/15/2016 in [facility] Cardiac and Pulmonary Rehab  Date  09/17/16  Educator  Berle Mull, MSW  Instruction Review Code  2- meets goals/outcomes      Anatomy & Physiology of the Heart: - Group verbal and written instruction and models provide basic cardiac anatomy and physiology, with the coronary electrical and arterial systems. Review of: AMI, Angina, Valve disease, Heart Failure, Cardiac Arrhythmia, Pacemakers, and the ICD. Flowsheet Row Cardiac Rehab from 12/15/2016 in [facility] Cardiac and Pulmonary Rehab  Date  12/08/16  Educator  C. Donovan  Instruction Review Code  2- meets goals/outcomes      Cardiac Procedures: - Group verbal and written instruction and models to describe the testing methods done to diagnose heart disease. Reviews the outcomes of the test results. Describes the treatment choices: Medical Management, Angioplasty, or Coronary Bypass Surgery. Flowsheet Row Cardiac Rehab from 12/15/2016 in [facility] Cardiac and Pulmonary Rehab  Date  12/15/16  Educator  CE  Instruction Review Code  2- meets goals/outcomes      Cardiac Medications: - Group verbal and written instruction to review commonly prescribed  medications for heart disease. Reviews the medication, class of the drug, and side effects. Includes the steps to properly store meds and maintain the prescription regimen. Flowsheet Row Cardiac Rehab from 12/15/2016 in [facility] Cardiac and Pulmonary Rehab  Date  10/22/16 Marisue Humble 2]  Educator  SB  Instruction Review Code  2- meets goals/outcomes      Go Sex-Intimacy & Heart Disease, Get SMART - Goal Setting: - Group verbal and written instruction through game format to discuss heart disease and the  return to sexual intimacy. Provides group verbal and written material to discuss and apply goal setting through the application of the S.M.A.R.T. Method. Flowsheet Row Cardiac Rehab from 12/15/2016 in [facility] Cardiac and Pulmonary Rehab  Date  12/15/16  Educator  CE  Instruction Review Code  2- meets goals/outcomes      Other Matters of the Heart: - Provides group verbal, written materials and models to describe Heart Failure, Angina, Valve Disease, and Diabetes in the realm of heart disease. Includes description of the disease process and treatment options available to the cardiac patient. Flowsheet Row Cardiac Rehab from 12/15/2016 in [facility] Cardiac and Pulmonary Rehab  Date  12/08/16  Educator  C. [place]  Instruction Review Code  2- meets goals/outcomes      Exercise & Equipment Safety: - Individual verbal instruction and demonstration of equipment use and safety with use of the equipment. Flowsheet Row Cardiac Rehab from 12/15/2016 in [facility] Cardiac and Pulmonary Rehab  Date  09/11/16  Educator  SB  Instruction Review Code  2- meets goals/outcomes      Infection Prevention: - Provides verbal and written material to individual with discussion of infection control including proper hand washing and proper equipment cleaning during exercise session. Flowsheet Row Cardiac Rehab from 12/15/2016 in [facility] Cardiac and Pulmonary Rehab  Date  09/11/16  Educator  SB  Instruction Review Code  2- meets  goals/outcomes      Falls Prevention: - Provides verbal and written material to individual with discussion of falls prevention and safety. Flowsheet Row Cardiac Rehab from 12/15/2016 in [facility] Cardiac and Pulmonary Rehab  Date  09/11/16  Educator  SB  Instruction Review Code  2- meets goals/outcomes      Diabetes: - Individual verbal and written instruction to review signs/symptoms of diabetes, desired ranges of glucose level fasting, after meals and with exercise. Advice that pre and post exercise glucose checks will be done for 3 sessions at entry of program. Flowsheet Row Cardiac Rehab from 12/15/2016 in [facility] Cardiac and Pulmonary Rehab  Date  09/11/16  Educator  SB  Instruction Review Code  2- meets goals/outcomes       Knowledge Questionnaire Score:     Knowledge Questionnaire Score - 12/12/16 1100      Knowledge Questionnaire Score   Pre Score 20/28   Post Score 18/28      Core Components/Risk Factors/Patient Goals at Admission:     Personal Goals and Risk Factors at Admission - 09/11/16 1432      Core Components/Risk Factors/Patient Goals on Admission    Weight Management Obesity;Weight Maintenance;Yes   Admit Weight 295 lb (133.8 kg)   Goal Weight: Short Term 290 lb (131.5 kg)   Goal Weight: Long Term 220 lb (99.8 kg)   Expected Outcomes Short Term: Continue to assess and modify interventions until short term weight is achieved;Long Term: Adherence to nutrition and physical activity/exercise program aimed toward attainment of established weight goal   Sedentary Yes   Intervention Provide advice, education, support and counseling about physical activity/exercise needs.;Develop an individualized exercise prescription for aerobic and resistive training based on initial evaluation findings, risk stratification, comorbidities and participant's personal goals.   Expected Outcomes Achievement of increased cardiorespiratory fitness and enhanced flexibility, muscular  endurance and strength shown through measurements of functional capacity and personal statement of participant.   Increase Strength and Stamina Yes   Intervention Provide advice, education, support and counseling about physical activity/exercise needs.;Develop an individualized exercise prescription for aerobic and resistive training  based on initial evaluation findings, risk stratification, comorbidities and participant's personal goals.   Expected Outcomes Achievement of increased cardiorespiratory fitness and enhanced flexibility, muscular endurance and strength shown through measurements of functional capacity and personal statement of participant.   Diabetes Yes   Intervention Provide education about signs/symptoms and action to take for hypo/hyperglycemia.;Provide education about proper nutrition, including hydration, and aerobic/resistive exercise prescription along with prescribed medications to achieve blood glucose in normal ranges: Fasting glucose 65-99 mg/dL   Expected Outcomes Short Term: Participant verbalizes understanding of the signs/symptoms and immediate care of hyper/hypoglycemia, proper foot care and importance of medication, aerobic/resistive exercise and nutrition plan for blood glucose control.;Long Term: Attainment of HbA1C < 7%.   Hypertension Yes   Intervention Provide education on lifestyle modifcations including regular physical activity/exercise, weight management, moderate sodium restriction and increased consumption of fresh fruit, vegetables, and low fat dairy, alcohol moderation, and smoking cessation.;Monitor prescription use compliance.   Expected Outcomes Short Term: Continued assessment and intervention until BP is < 140/64m HG in hypertensive participants. < 130/846mHG in hypertensive participants with diabetes, heart failure or chronic kidney disease.;Long Term: Maintenance of blood pressure at goal levels.   Lipids Yes  Statins cause muscle pain.   Crestor is new  and so far doing well taking the Crestor   Intervention Provide education and support for participant on nutrition & aerobic/resistive exercise along with prescribed medications to achieve LDL <7021mHDL >42m54m Expected Outcomes Short Term: Participant states understanding of desired cholesterol values and is compliant with medications prescribed. Participant is following exercise prescription and nutrition guidelines.;Long Term: Cholesterol controlled with medications as prescribed, with individualized exercise RX and with personalized nutrition plan. Value goals: LDL < 70mg80mL > 40 mg.      Core Components/Risk Factors/Patient Goals Review:      Goals and Risk Factor Review    Row Name 09/24/16 1643 10/23/16 1103 11/27/16 1353 12/01/16 1656 12/10/16 1634     Core Components/Risk Factors/Patient Goals Review   Personal Goals Review Weight Management/Obesity;Diabetes;Stress Weight Management/Obesity;Diabetes;Hypertension Sedentary;Diabetes Diabetes;Hypertension;Lipids Weight Management/Obesity;Sedentary;Diabetes   Review GarlaDhilannePatrick Jupiterd his blood sugars have been so high at times but at 3pm today his blood sugar was 47 and he ate 3 small [place] candy bars which usually helps him but when he arrive at Cardiac REhab at 4pm his initial finger stick blood sugar was 57. Galo ate 2 of our glucose tablets but said they dont help so given 2 Smartie candies.  WaynePatrick Jupiterrts his BG has been between 104-213 measuring at home.  His BP has been steady.  [facility] and doesn't feel he needs to meet with them here.  he feels the most important thing right now is regular exercise.  He was given info on the [employer] discussed the options for when he graduates. GarlaGodfreynePatrick Jupiter unable to attend Cardiac REhab due to a skin graft on his toe. WaynePatrick Jupiterclearnance to return "as long as he did not stub his toe'. WaynePatrick Jupiterbeen able to walk on the treadmill  since his return with no problems. [name]'s blood sugars have been flucuating. One time he asked me to check his blood sugar since he felt it was low and it was like 180. We check Wayn'es blood sugars frequenlty in Cardiac Rehab.  WaynePatrick Jupiteres he is compliant with his BP and cholesterol meds and he is attempting to follow the proper nutrition  plan.  Dawsen said his diabetes blood sugars for years have been up and down but he notices since he has been exercising his blood sugar have been better.    Expected Outcomes Main goal is to get blood sugars in appropriate range. Decreased weight at some point. [name] will continue to develop regular exercise habits in Heart Track and will maintain after he graduates.  Regular exercise will help him control his risk factors and reduce risk of another CV event.  - Continue adherence to risk factor control  -   Row Name 12/10/16 1635             Core Components/Risk Factors/Patient Goals Review   Personal Goals Review Hypertension;Diabetes;Lipids       Review Francisca's blood pressure has been better. His MD follows up with his lipid blood work and he is working on exercising and his diet to help that blood work look better.        Expected Outcomes To get healthier-heart healthier lifestyle.          Core Components/Risk Factors/Patient Goals at Discharge (Final Review):      Goals and Risk Factor Review - 12/10/16 1635      Core Components/Risk Factors/Patient Goals Review   Personal Goals Review Hypertension;Diabetes;Lipids   Review Ayuub's blood pressure has been better. His MD follows up with his lipid blood work and he is working on exercising and his diet to help that blood work look better.    Expected Outcomes To get healthier-heart healthier lifestyle.      ITP Comments:     ITP Comments    Row Name 09/11/16 1430 09/11/16 1450 09/24/16 1643 10/02/16 1758 10/06/16 1812   ITP Comments Medica review completed today Initial ITP created.  Documentation of Diagnosis can be found in [facility]  Surgery 06/27/2016 Medica review completed today Initial ITP created. Documentation of Diagnosis can be found in [facility]  Surgery [facility]/24/2016 [facility]" said he has seen so many dieticians over the years and his blood sugars have been way up and way down some days. He sees an Endocrine MD at the [place].  [place] felt symptomatic for low BG and checked it himself without informing staff.  He was eating a candy bar and it was advised to stop by staff.  He was given glucose tabs, BG was 106 upon recheck and he was given crackers before leaving. Clinton stsed he was experiencing pressure in his throat/chest / on pain scale. This occured while on the treadmill, he was slowed down and symptoms reduced/ He was stopped and the symptoms resolved.  He stated he was experiencing throat/sinus drainage today and felt stuffed up. He was advised to call his PMd and his cardiololgist to report the various symptoms. He had experienced the throat discomfort over the weekend at times too.    Row Name 10/08/16 1017 10/30/16 1655 11/04/16 0631 12/01/16 1739 12/03/16 0630   ITP Comments 30 day review completed for review by Dr Emily Filbert.  Continue with ITP unless changes noted by Dr Sabra Heck. "[name]" Josean said he has had a rough year this past year with carpel tunnel syndrom surgery and other medical problems. [name] said he didn't feel well today after exercising for about 10  minutes on the Nustep. His blood sugar was 125 and his blood pressure was stable at 140/80.  30 day review. Continue with ITP unless directed changes per Medical Director review. [place]ford complaioned of burning sensation in his throat during exercise  today. He was asked to slow downa nd the symptoms resolved with less workload. He was advised to keep his workload at the lower level.  No more symptoms occured today.  Review with [name] found that he has had the symptoms prior to today with exertion. He has been advised to talk  to his physician about the symptoms. He was also advised to call 911 if symptoms occur and do not resolve with rest.  He verbalized understanding of the advice provided today 30 day review. Continue with ITP unless directed changes per Medical Director review.   [place] Name 12/10/16 1638 12/15/16 1823         ITP Comments Kyndal said he told the MD today that he has been having neck fullness which stops when he stops exercising. Ancelmo said his MD scheduled him for a stress test that first available Feb 24th. I instructed him during exericse here to stop and let us know if he has that neck fullness that does not go away. Zymere said he does have NTG but he has not had to take one since rest stops his neck fullness. I instructed Sultan Pargas" to call 911 if he is not here and it does not go away. Discharged today after completion of the program.  Will join [employer].         Comments:

## 2016-12-26 ENCOUNTER — Ambulatory Visit (INDEPENDENT_AMBULATORY_CARE_PROVIDER_SITE_OTHER): Payer: Medicare Other | Admitting: Podiatry

## 2016-12-26 ENCOUNTER — Encounter: Payer: Self-pay | Admitting: Podiatry

## 2016-12-26 DIAGNOSIS — E0843 Diabetes mellitus due to underlying condition with diabetic autonomic (poly)neuropathy: Secondary | ICD-10-CM

## 2016-12-26 DIAGNOSIS — I70245 Atherosclerosis of native arteries of left leg with ulceration of other part of foot: Secondary | ICD-10-CM | POA: Diagnosis not present

## 2016-12-26 DIAGNOSIS — M79609 Pain in unspecified limb: Secondary | ICD-10-CM

## 2016-12-26 DIAGNOSIS — E1143 Type 2 diabetes mellitus with diabetic autonomic (poly)neuropathy: Secondary | ICD-10-CM | POA: Diagnosis not present

## 2016-12-26 DIAGNOSIS — B351 Tinea unguium: Secondary | ICD-10-CM | POA: Diagnosis not present

## 2016-12-26 DIAGNOSIS — L608 Other nail disorders: Secondary | ICD-10-CM

## 2016-12-26 DIAGNOSIS — L603 Nail dystrophy: Secondary | ICD-10-CM

## 2016-12-26 DIAGNOSIS — L97522 Non-pressure chronic ulcer of other part of left foot with fat layer exposed: Secondary | ICD-10-CM

## 2016-12-26 NOTE — Progress Notes (Signed)
   Subjective:  Patient presents today for follow-up evaluation of an ulceration to the second digit left foot secondary to diabetes mellitus. Patient states there is been no draining or bleeding on his dressing changes over the past 2 weeks. Patient states he is doing much better.    Objective/Physical Exam General: The patient is alert and oriented x3 in no acute distress.  Dermatology:  Wound #1 noted to the distal tuft of the second digit left foot measuring 001.001.001.001 cm (LxWxD).   There is a new wound noted to the medial neck of the second digit left foot measuring approximately 1.00.70.1cm (LxWxD).   To the noted ulceration(s), there is no eschar. There is a moderate amount of slough, fibrin, and necrotic tissue noted. Granulation tissue and wound base is red. There is a minimal amount of serosanguineous drainage noted. There is no exposed bone muscle-tendon ligament or joint. There is no malodor. Periwound integrity is intact with localized cellulitis around the second digit left foot. Skin is warm, dry and supple bilateral lower extremities.  Vascular: Palpable pedal pulses bilaterally. No edema or erythema noted. Capillary refill within normal limits.  Neurological: Epicritic and protective threshold absent bilaterally.   Musculoskeletal Exam: Range of motion within normal limits to all pedal and ankle joints bilateral. Muscle strength 5/5 in all groups bilateral.   Assessment: #1 ulcerations second digit left foot secondary to diabetes mellitus #2 diabetes mellitus w/ peripheral neuropathy   Plan of Care:  #1 Patient was evaluated. #2 medically necessary excisional debridement including subcutaneous tissue was performed using a tissue nipper and a chisel blade. Excisional debridement of all the necrotic nonviable tissue down to healthy bleeding viable tissue was performed with post-debridement measurements same as pre-. #3 the wound was cleansed and dry sterile dressing  applied. #4 patient has finished his antibiotic Bactrim DS #5 continue antibiotic mupirocin cream topical #6 return to clinic in 2 weeks  [name], DPM [facility]  Dr. [name], [address]                                        [place], [place] [address]                Office [phone]  Fax [phone]

## 2016-12-30 DIAGNOSIS — I25708 Atherosclerosis of coronary artery bypass graft(s), unspecified, with other forms of angina pectoris: Secondary | ICD-10-CM | POA: Diagnosis not present

## 2016-12-30 NOTE — Progress Notes (Signed)
   Subjective:  Patient presents today for follow-up evaluation of an ulceration to the second digit left foot secondary to diabetes mellitus. Patient states there is been no draining or bleeding on his dressing changes over the past 2 weeks. Patient states he is doing much better.  Patient also complains today of thickened, elongated, discolored nails 1-5 bilateral. Patient does have a history of diabetes mellitus. Patient is unable to trim his own nails and they're painful when ambulating in shoe gear   Objective/Physical Exam General: The patient is alert and oriented x3 in no acute distress.  Dermatology:  Thickened, elongated, dystrophic nails noted 1-5 bilateral.  Wound #1 noted to the distal tuft of the second digit left foot measuring [id number] cm (LxWxD).   There is a new wound noted to the medial aspect of the second digit left foot measuring approximately 1.00.70.1cm (LxWxD).   To the noted ulceration(s), there is no eschar. There is a moderate amount of slough, fibrin, and necrotic tissue noted. Granulation tissue and wound base is red. There is a minimal amount of serosanguineous drainage noted. There is no exposed bone muscle-tendon ligament or joint. There is no malodor. Periwound integrity is intact with localized cellulitis around the second digit left foot. Skin is warm, dry and supple bilateral lower extremities.  Vascular: Palpable pedal pulses bilaterally. No edema or erythema noted. Capillary refill within normal limits.  Neurological: Epicritic and protective threshold absent bilaterally.   Musculoskeletal Exam: Range of motion within normal limits to all pedal and ankle joints bilateral. Muscle strength 5/5 in all groups bilateral.   Assessment: #1 ulcerations second digit left foot secondary to diabetes mellitus #2 diabetes mellitus w/ peripheral neuropathy   Plan of Care:  #1 Patient was evaluated. #2 medically necessary excisional debridement including  subcutaneous tissue was performed using a tissue nipper and a chisel blade. Excisional debridement of all the necrotic nonviable tissue down to healthy bleeding viable tissue was performed with post-debridement measurements same as pre-. #3 the wound was cleansed and dry sterile dressing applied. #4 patient has finished his antibiotic Bactrim DS #5 continue antibiotic mupirocin cream topical #6  mechanical debridement of nails 1-5 was performed using a nail nipper without incident or bleeding. #7 return to clinic in 2 weeks  [name], DPM [facility]  Dr. [name], [address]                                        [place], [place] [address]                Office [phone]  Fax [phone]

## 2017-01-01 DIAGNOSIS — I25708 Atherosclerosis of coronary artery bypass graft(s), unspecified, with other forms of angina pectoris: Secondary | ICD-10-CM | POA: Diagnosis not present

## 2017-01-01 DIAGNOSIS — Z01818 Encounter for other preprocedural examination: Secondary | ICD-10-CM | POA: Diagnosis not present

## 2017-01-13 ENCOUNTER — Encounter
Admission: RE | Disposition: A | Discharge status: Home / Self Care | Payer: Self-pay | Source: Ambulatory Visit | Attending: Internal Medicine

## 2017-01-13 ENCOUNTER — Ambulatory Visit: Payer: Medicare Other | Admitting: Podiatry

## 2017-01-13 ENCOUNTER — Ambulatory Visit
Admission: RE | Admit: 2017-01-13 | Discharge: 2017-01-13 | Disposition: A | Discharge status: Home / Self Care | Payer: Medicare Other | Source: Ambulatory Visit | Attending: Internal Medicine | Admitting: Internal Medicine

## 2017-01-13 DIAGNOSIS — Z8249 Family history of ischemic heart disease and other diseases of the circulatory system: Secondary | ICD-10-CM | POA: Insufficient documentation

## 2017-01-13 DIAGNOSIS — E1140 Type 2 diabetes mellitus with diabetic neuropathy, unspecified: Secondary | ICD-10-CM | POA: Diagnosis not present

## 2017-01-13 DIAGNOSIS — I252 Old myocardial infarction: Secondary | ICD-10-CM | POA: Insufficient documentation

## 2017-01-13 DIAGNOSIS — I6521 Occlusion and stenosis of right carotid artery: Secondary | ICD-10-CM | POA: Diagnosis not present

## 2017-01-13 DIAGNOSIS — Z87891 Personal history of nicotine dependence: Secondary | ICD-10-CM | POA: Insufficient documentation

## 2017-01-13 DIAGNOSIS — Z794 Long term (current) use of insulin: Secondary | ICD-10-CM | POA: Diagnosis not present

## 2017-01-13 DIAGNOSIS — Z9989 Dependence on other enabling machines and devices: Secondary | ICD-10-CM | POA: Diagnosis not present

## 2017-01-13 DIAGNOSIS — G4730 Sleep apnea, unspecified: Secondary | ICD-10-CM | POA: Diagnosis not present

## 2017-01-13 DIAGNOSIS — Z96652 Presence of left artificial knee joint: Secondary | ICD-10-CM | POA: Insufficient documentation

## 2017-01-13 DIAGNOSIS — E782 Mixed hyperlipidemia: Secondary | ICD-10-CM | POA: Insufficient documentation

## 2017-01-13 DIAGNOSIS — Z79899 Other long term (current) drug therapy: Secondary | ICD-10-CM | POA: Diagnosis not present

## 2017-01-13 DIAGNOSIS — I10 Essential (primary) hypertension: Secondary | ICD-10-CM | POA: Insufficient documentation

## 2017-01-13 DIAGNOSIS — I200 Unstable angina: Secondary | ICD-10-CM | POA: Diagnosis present

## 2017-01-13 DIAGNOSIS — I25790 Atherosclerosis of other coronary artery bypass graft(s) with unstable angina pectoris: Secondary | ICD-10-CM | POA: Diagnosis not present

## 2017-01-13 DIAGNOSIS — I2510 Atherosclerotic heart disease of native coronary artery without angina pectoris: Secondary | ICD-10-CM | POA: Diagnosis present

## 2017-01-13 DIAGNOSIS — I2583 Coronary atherosclerosis due to lipid rich plaque: Secondary | ICD-10-CM | POA: Insufficient documentation

## 2017-01-13 DIAGNOSIS — Z951 Presence of aortocoronary bypass graft: Secondary | ICD-10-CM | POA: Insufficient documentation

## 2017-01-13 DIAGNOSIS — Z7982 Long term (current) use of aspirin: Secondary | ICD-10-CM | POA: Insufficient documentation

## 2017-01-13 DIAGNOSIS — I25708 Atherosclerosis of coronary artery bypass graft(s), unspecified, with other forms of angina pectoris: Secondary | ICD-10-CM | POA: Insufficient documentation

## 2017-01-13 HISTORY — PX: LEFT HEART CATH AND CORS/GRAFTS ANGIOGRAPHY: CATH118250

## 2017-01-13 SURGERY — LEFT HEART CATH AND CORS/GRAFTS ANGIOGRAPHY
Anesthesia: Moderate Sedation | Laterality: Left

## 2017-01-13 MED ORDER — FENTANYL CITRATE (PF) 100 MCG/2ML IJ SOLN
INTRAMUSCULAR | Status: DC | PRN
  Administered 2017-01-13: 25 ug via INTRAVENOUS

## 2017-01-13 MED ORDER — SODIUM CHLORIDE 0.9 % IV SOLN: 250.0000 mL | INTRAVENOUS | Status: DC | PRN

## 2017-01-13 MED ORDER — SODIUM CHLORIDE 0.9 % WEIGHT BASED INFUSION
400.0000 mL/h | INTRAVENOUS | Status: DC
  Administered 2017-01-13: 400 mL/h via INTRAVENOUS

## 2017-01-13 MED ORDER — SODIUM CHLORIDE 0.9% FLUSH: 3.0000 mL | INTRAVENOUS | Status: DC | PRN

## 2017-01-13 MED ORDER — ASPIRIN 81 MG PO CHEW: 81.0000 mg | CHEWABLE_TABLET | ORAL | Status: DC

## 2017-01-13 MED ORDER — IOPAMIDOL (ISOVUE-300) INJECTION 61%
INTRAVENOUS | Status: DC | PRN
  Administered 2017-01-13: 205 mL via INTRA_ARTERIAL

## 2017-01-13 MED ORDER — SODIUM CHLORIDE 0.9% FLUSH: 3.0000 mL | Freq: Two times a day (BID) | INTRAVENOUS | Status: DC

## 2017-01-13 MED ORDER — SODIUM CHLORIDE 0.9 % WEIGHT BASED INFUSION: 134.3000 mL/h | INTRAVENOUS | Status: DC

## 2017-01-13 MED ORDER — FENTANYL CITRATE (PF) 100 MCG/2ML IJ SOLN
INTRAMUSCULAR | Status: AC
  Filled 2017-01-13: qty 2

## 2017-01-13 MED ORDER — SODIUM CHLORIDE 0.9 % WEIGHT BASED INFUSION: 1.0000 mL/kg/h | INTRAVENOUS | Status: DC

## 2017-01-13 MED ORDER — ONDANSETRON HCL 4 MG/2ML IJ SOLN: 4.0000 mg | Freq: Four times a day (QID) | INTRAMUSCULAR | Status: DC | PRN

## 2017-01-13 MED ORDER — HEPARIN (PORCINE) IN NACL 2-0.9 UNIT/ML-% IJ SOLN
INTRAMUSCULAR | Status: AC
  Filled 2017-01-13: qty 500

## 2017-01-13 MED ORDER — MIDAZOLAM HCL 2 MG/2ML IJ SOLN
INTRAMUSCULAR | Status: AC
  Filled 2017-01-13: qty 2

## 2017-01-13 MED ORDER — MIDAZOLAM HCL 2 MG/2ML IJ SOLN
INTRAMUSCULAR | Status: DC | PRN
  Administered 2017-01-13: 1 mg via INTRAVENOUS

## 2017-01-13 MED ORDER — ACETAMINOPHEN 325 MG PO TABS: 650.0000 mg | ORAL_TABLET | ORAL | Status: DC | PRN

## 2017-01-13 SURGICAL SUPPLY — 9 items
CATH 5FR PIGTAIL DIAGNOSTIC (CATHETERS) ×2 IMPLANT
CATH INFINITI 5FR JL4 (CATHETERS) ×2 IMPLANT
CATH INFINITI JR4 5F (CATHETERS) ×2 IMPLANT
DEVICE CLOSURE MYNXGRIP 5F (Vascular Products) ×2 IMPLANT
KIT MANI 3VAL PERCEP (MISCELLANEOUS) ×2 IMPLANT
NEEDLE PERC 18GX7CM (NEEDLE) ×2 IMPLANT
PACK CARDIAC CATH (CUSTOM PROCEDURE TRAY) ×2 IMPLANT
SHEATH PINNACLE 5F 10CM (SHEATH) ×2 IMPLANT
WIRE EMERALD 3MM-J .035X150CM (WIRE) ×2 IMPLANT

## 2017-01-13 NOTE — Discharge Instructions (Signed)

## 2017-01-14 ENCOUNTER — Encounter: Payer: Self-pay | Admitting: Internal Medicine

## 2017-01-16 ENCOUNTER — Ambulatory Visit (INDEPENDENT_AMBULATORY_CARE_PROVIDER_SITE_OTHER): Payer: Medicare Other

## 2017-01-16 ENCOUNTER — Encounter: Payer: Self-pay | Admitting: Podiatry

## 2017-01-16 ENCOUNTER — Ambulatory Visit (INDEPENDENT_AMBULATORY_CARE_PROVIDER_SITE_OTHER): Payer: Medicare Other | Admitting: Podiatry

## 2017-01-16 DIAGNOSIS — L97522 Non-pressure chronic ulcer of other part of left foot with fat layer exposed: Secondary | ICD-10-CM

## 2017-01-16 DIAGNOSIS — E1143 Type 2 diabetes mellitus with diabetic autonomic (poly)neuropathy: Secondary | ICD-10-CM | POA: Diagnosis not present

## 2017-01-16 DIAGNOSIS — I70245 Atherosclerosis of native arteries of left leg with ulceration of other part of foot: Secondary | ICD-10-CM

## 2017-01-16 MED ORDER — MUPIROCIN 2 % EX OINT: 1.0000 "application " | TOPICAL_OINTMENT | Freq: Two times a day (BID) | CUTANEOUS | 1 refills | Status: DC

## 2017-01-16 MED ORDER — SULFAMETHOXAZOLE-TRIMETHOPRIM 400-80 MG PO TABS: 1.0000 | ORAL_TABLET | Freq: Two times a day (BID) | ORAL | 0 refills | Status: DC

## 2017-01-16 NOTE — Patient Instructions (Signed)

## 2017-01-19 ENCOUNTER — Telehealth: Payer: Self-pay | Admitting: *Deleted

## 2017-01-19 LAB — WOUND CULTURE: Organism ID, Bacteria: NONE SEEN

## 2017-01-19 NOTE — Telephone Encounter (Signed)
I think this was to be sent to Dr. Amalia Hailey.

## 2017-01-19 NOTE — Telephone Encounter (Signed)
I'm calling you back to let you know Dr. Amalia Hailey said he can do it on Wednesday 01/28/2017 around lunch time.  "Okay, that date will be fine."  Someone from the surgical center will call you with the arrival time a day or two prior to surgery date.  You can go ahead and register with the surgical center.

## 2017-01-19 NOTE — Telephone Encounter (Signed)
"  I saw Dr. Amalia Hailey on Friday.  He said I would need to have part of my toe cut off.  I was given a tentative date of 01/22/17."  Dr. Amalia Hailey is not doing surgery that day.  We can possibly try to do it on March 21.  "I'm scheduled to see my Cardiologist that morning at 10 am.  Dr. Amalia Hailey wanted me to get clearance."  Okay we may have to go to the following Wednesday.  I'll have to check with Dr. Amalia Hailey and see if it's okay.  I'll give you a call back.

## 2017-01-21 DIAGNOSIS — I10 Essential (primary) hypertension: Secondary | ICD-10-CM | POA: Diagnosis not present

## 2017-01-21 DIAGNOSIS — I25708 Atherosclerosis of coronary artery bypass graft(s), unspecified, with other forms of angina pectoris: Secondary | ICD-10-CM | POA: Diagnosis not present

## 2017-01-21 DIAGNOSIS — E782 Mixed hyperlipidemia: Secondary | ICD-10-CM | POA: Diagnosis not present

## 2017-01-26 NOTE — Progress Notes (Signed)
   Subjective:  Patient presents today for follow-up treatment and evaluation of an ulcer to the second digit left foot secondary to diabetes mellitus. Patient states that the ulceration looks about the same. He states that the ulcer is very slow to heal.   Objective/Physical Exam General: The patient is alert and oriented x3 in no acute distress.  Dermatology:  Thickened, elongated, dystrophic nails noted 1-5 bilateral.  Wound #1 noted to the distal tuft of the second digit left foot measuring 003.003.003.003 cm (LxWxD).   There is a new wound noted to the medial aspect of the second digit left foot measuring approximately 1.00.70.1cm (LxWxD).   To the noted ulceration(s), there is no eschar. There is a moderate amount of slough, fibrin, and necrotic tissue noted. Granulation tissue and wound base is red. There is a minimal amount of serosanguineous drainage noted. There is no exposed bone muscle-tendon ligament or joint. There is no malodor. Periwound integrity is intact with localized cellulitis around the second digit left foot. Skin is warm, dry and supple bilateral lower extremities.  Vascular: Palpable pedal pulses bilaterally. No edema or erythema noted. Capillary refill within normal limits.  Neurological: Epicritic and protective threshold absent bilaterally.   Musculoskeletal Exam: Range of motion within normal limits to all pedal and ankle joints bilateral. Muscle strength 5/5 in all groups bilateral.   Radiographic exam: Cortical erosion and periosteal reaction noted to the distal tuft of the second digit left foot on AP view. There are acute changes noted compared to previous x-rays. This is consistent with osteomyelitis.  Assessment: #1 ulcerations second digit left foot secondary to diabetes mellitus #2 diabetes mellitus w/ peripheral neuropathy #3 osteomyelitis second digit left foot   Plan of Care:  #1 Patient was evaluated. #2 medically necessary excisional  debridement including subcutaneous tissue was performed using a tissue nipper and a chisel blade. Excisional debridement of all the necrotic nonviable tissue down to healthy bleeding viable tissue was performed with post-debridement measurements same as pre-. #3 the wound was cleansed and dry sterile dressing applied. #4 today we discussed the need for surgical intervention and amputation of the second digit left foot due to acute changes and osteomyelitis. Patient agrees. Today we will proceed with authorization for surgery which will include amputation second digit left foot with incision and drainage. #5 refill prescription for mupirocin 2% Exline #6 prescription for Bactrim DS #7 today cultures were taken #8 return to clinic in 2 weeks, or 1 week postop [name], DPM [facility]  Dr. [name], [address]                                        [place], [place] [address]                Office [phone]  Fax [phone]

## 2017-01-28 ENCOUNTER — Encounter: Payer: Self-pay | Admitting: Podiatry

## 2017-01-28 DIAGNOSIS — M65072 Abscess of tendon sheath, left ankle and foot: Secondary | ICD-10-CM | POA: Diagnosis not present

## 2017-01-28 DIAGNOSIS — M869 Osteomyelitis, unspecified: Secondary | ICD-10-CM | POA: Diagnosis not present

## 2017-01-28 DIAGNOSIS — L97522 Non-pressure chronic ulcer of other part of left foot with fat layer exposed: Secondary | ICD-10-CM | POA: Diagnosis not present

## 2017-01-28 DIAGNOSIS — L02612 Cutaneous abscess of left foot: Secondary | ICD-10-CM | POA: Diagnosis not present

## 2017-01-28 DIAGNOSIS — E7800 Pure hypercholesterolemia, unspecified: Secondary | ICD-10-CM | POA: Diagnosis not present

## 2017-01-28 DIAGNOSIS — M86672 Other chronic osteomyelitis, left ankle and foot: Secondary | ICD-10-CM | POA: Diagnosis not present

## 2017-01-28 DIAGNOSIS — M86472 Chronic osteomyelitis with draining sinus, left ankle and foot: Secondary | ICD-10-CM | POA: Diagnosis not present

## 2017-01-28 DIAGNOSIS — E1143 Type 2 diabetes mellitus with diabetic autonomic (poly)neuropathy: Secondary | ICD-10-CM | POA: Diagnosis not present

## 2017-01-29 ENCOUNTER — Telehealth: Payer: Self-pay | Admitting: Podiatry

## 2017-01-29 NOTE — Telephone Encounter (Addendum)
I spoke with pt, he states the prescription for he pain medication was filled out wrong, it had Dr. Amalia Hailey name on it. Pt states he doesn't think he needs the prescription he has taken an Aleve and is doing okay. I told pt he may want to have a light pain medication for over the weekend. Left message informing Dr. Amalia Hailey of pt's situation and request call back with orders.01/30/2017-I informed pt of Dr. Amalia Hailey 01/30/2017 6:39am orders. Pt states he will pick up the medication at the [place] office.

## 2017-01-29 NOTE — Telephone Encounter (Signed)
Pharmacy said Rx was not filled out correct, so they would not fill it.

## 2017-01-30 ENCOUNTER — Ambulatory Visit (INDEPENDENT_AMBULATORY_CARE_PROVIDER_SITE_OTHER): Payer: Medicare Other | Admitting: Podiatry

## 2017-01-30 DIAGNOSIS — Z9889 Other specified postprocedural states: Secondary | ICD-10-CM

## 2017-01-30 NOTE — Telephone Encounter (Signed)
Yes, please have him come into the office here in [place] this morning if he feels like he needs pain medication over the weekend and I'll get him a prescription.  Thanks, Dr. Amalia Hailey

## 2017-01-30 NOTE — Progress Notes (Signed)
Subjective: Patient presents today status post second digit amputation of the left foot. Date of surgery 01/28/2017. Patient presents today because there was excessive bleeding to his dressing and he needs to be redressed.  Objective: Skin incisions are well coapted with sutures intact. There is sanguinous drainage noted to the dressings.  Assessment: Status post second digit amputation left foot secondary to diabetes mellitus and osteomyelitis. Date of surgery 01/28/2017.  Plan of care: Today dressings were changed. Keep dressings clean dry and intact until he is to follow surgeon Dr. Daylene Katayama the office next week.  [name], DPM [facility]  Dr. [name], [address]                                        [place], [place] [address]                Office [phone]  Fax [phone]

## 2017-02-03 ENCOUNTER — Encounter: Payer: Self-pay | Admitting: Podiatry

## 2017-02-06 ENCOUNTER — Ambulatory Visit (INDEPENDENT_AMBULATORY_CARE_PROVIDER_SITE_OTHER): Payer: Medicare Other | Admitting: Podiatry

## 2017-02-06 ENCOUNTER — Ambulatory Visit (INDEPENDENT_AMBULATORY_CARE_PROVIDER_SITE_OTHER): Payer: Medicare Other

## 2017-02-06 ENCOUNTER — Encounter: Payer: Self-pay | Admitting: Podiatry

## 2017-02-06 DIAGNOSIS — S98132A Complete traumatic amputation of one left lesser toe, initial encounter: Secondary | ICD-10-CM | POA: Diagnosis not present

## 2017-02-06 DIAGNOSIS — Z9889 Other specified postprocedural states: Secondary | ICD-10-CM

## 2017-02-06 DIAGNOSIS — L97522 Non-pressure chronic ulcer of other part of left foot with fat layer exposed: Secondary | ICD-10-CM

## 2017-02-06 NOTE — Progress Notes (Signed)
Subjective: Patient presents today status post second digit amputation of the left foot. Date of surgery 01/28/2017. Patient states that he is doing very well. He is only taking Advil or Tylenol for the pain. Patient is very satisfied today.  Objective: Skin incisions are well coapted with sutures intact. No sign of infectious process. Negative for drainage today.  Assessment: Status post second digit amputation left foot secondary to diabetes mellitus and osteomyelitis. Date of surgery 01/28/2017.  Plan of care: Today dressings were changed. Continue postoperative shoe. Recommend urea 40% cream for calluses to the bilateral feet. Return to clinic in 1 week for suture removal  [name], DPM [facility]  Dr. [name], [address]                                        [place], [place] [address]                Office [phone]  Fax [phone]

## 2017-02-13 ENCOUNTER — Ambulatory Visit (INDEPENDENT_AMBULATORY_CARE_PROVIDER_SITE_OTHER): Payer: Medicare Other | Admitting: Podiatry

## 2017-02-13 ENCOUNTER — Encounter: Payer: Self-pay | Admitting: Podiatry

## 2017-02-13 DIAGNOSIS — L97522 Non-pressure chronic ulcer of other part of left foot with fat layer exposed: Secondary | ICD-10-CM

## 2017-02-13 DIAGNOSIS — Z9889 Other specified postprocedural states: Secondary | ICD-10-CM

## 2017-02-13 DIAGNOSIS — I70245 Atherosclerosis of native arteries of left leg with ulceration of other part of foot: Secondary | ICD-10-CM

## 2017-02-16 NOTE — Progress Notes (Signed)
Subjective: Patient presents today status post second digit amputation of the left foot. Date of surgery 01/28/2017. Patient states that he is doing well. He denies any other complaint.  Objective: Skin incisions are well coapted with sutures intact. No sign of infectious process. Negative for drainage today.  Assessment: Status post second digit amputation left foot secondary to diabetes mellitus and osteomyelitis. Date of surgery 01/28/2017.  Plan of care: Sutures were removed. Start bathing on 02/16/17. Return to clinic in 4 weeks.  [name], DPM [facility]  Dr. [name], [address]                                        [place], [place] [address]                Office [phone]  Fax [phone]

## 2017-02-17 DIAGNOSIS — Z96652 Presence of left artificial knee joint: Secondary | ICD-10-CM | POA: Diagnosis not present

## 2017-02-26 ENCOUNTER — Telehealth: Payer: Self-pay | Admitting: Podiatry

## 2017-02-26 ENCOUNTER — Encounter: Payer: Self-pay | Admitting: Podiatry

## 2017-02-26 NOTE — Telephone Encounter (Signed)
Yes, the patient has clearance from me. Thanks, Dr. Amalia Hailey

## 2017-02-26 NOTE — Telephone Encounter (Signed)
Patient called and said that he needs a letter from Dr. Amalia Hailey giving him clearance to start physical therapy at [facility] thru heart track for cardio. Dr. Amalia Hailey amputated toe on left foot and the heart track wants medical clearance from Dr. Amalia Hailey before starting this class. The patients cardiologist has already given clearance. Patient has appt on May 11 with Dr. Amalia Hailey, but would like to start this class before that and would like to pick up this letter in the [place] office as soon as possible.

## 2017-02-27 NOTE — Telephone Encounter (Signed)
Note done and left at front desk for pick up

## 2017-03-13 ENCOUNTER — Ambulatory Visit (INDEPENDENT_AMBULATORY_CARE_PROVIDER_SITE_OTHER): Payer: Self-pay | Admitting: Podiatry

## 2017-03-13 ENCOUNTER — Encounter: Payer: Self-pay | Admitting: Podiatry

## 2017-03-13 DIAGNOSIS — E0842 Diabetes mellitus due to underlying condition with diabetic polyneuropathy: Secondary | ICD-10-CM

## 2017-03-16 NOTE — Progress Notes (Signed)
DOS 01/28/2017 second digit amputation left foot :incision and drainage left foot

## 2017-03-16 NOTE — Progress Notes (Signed)
Subjective: Patient presents today status post second digit amputation of the left foot. Date of surgery 01/28/2017. Patient states that he is nearly completely healed. He reports the scab has fallen off. He reports a new complaint of a small dark area on the fourth left toe nail that has been present for the past month. He denies any pain or drainage from the area.  Objective: Skin incisions are well coapted. No sign of infectious process. Negative for drainage today.  Radiographic exam: Radiographic exam consistent with amputation of the second digit left foot which appears to be stable. No osteolytic process or fracture noted. Normal osseous mineralization.  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 Date of surgery 01/28/2017.  Plan of care: Patient evaluated. X-rays reviewed. Recommended good diabetic shoe gear. Return to clinic in 3 months.  [name], DPM [facility]  Dr. [name], [address]                                        [place], [place] [address]                Office [phone]  Fax [phone]

## 2017-03-17 ENCOUNTER — Observation Stay
Admission: EM | Admit: 2017-03-17 | Discharge: 2017-03-19 | Disposition: A | Discharge status: Home / Self Care | Payer: Medicare Other | Attending: Internal Medicine | Admitting: Internal Medicine

## 2017-03-17 ENCOUNTER — Emergency Department: Payer: Medicare Other

## 2017-03-17 ENCOUNTER — Encounter: Payer: Self-pay | Admitting: *Deleted

## 2017-03-17 DIAGNOSIS — I214 Non-ST elevation (NSTEMI) myocardial infarction: Principal | ICD-10-CM | POA: Insufficient documentation

## 2017-03-17 DIAGNOSIS — T82898A Other specified complication of vascular prosthetic devices, implants and grafts, initial encounter: Secondary | ICD-10-CM | POA: Diagnosis not present

## 2017-03-17 DIAGNOSIS — Z794 Long term (current) use of insulin: Secondary | ICD-10-CM | POA: Diagnosis not present

## 2017-03-17 DIAGNOSIS — Z96652 Presence of left artificial knee joint: Secondary | ICD-10-CM | POA: Insufficient documentation

## 2017-03-17 DIAGNOSIS — Z951 Presence of aortocoronary bypass graft: Secondary | ICD-10-CM | POA: Insufficient documentation

## 2017-03-17 DIAGNOSIS — I2510 Atherosclerotic heart disease of native coronary artery without angina pectoris: Secondary | ICD-10-CM | POA: Diagnosis not present

## 2017-03-17 DIAGNOSIS — E1040 Type 1 diabetes mellitus with diabetic neuropathy, unspecified: Secondary | ICD-10-CM | POA: Diagnosis not present

## 2017-03-17 DIAGNOSIS — I252 Old myocardial infarction: Secondary | ICD-10-CM | POA: Insufficient documentation

## 2017-03-17 DIAGNOSIS — Z87891 Personal history of nicotine dependence: Secondary | ICD-10-CM | POA: Insufficient documentation

## 2017-03-17 DIAGNOSIS — Z7982 Long term (current) use of aspirin: Secondary | ICD-10-CM | POA: Insufficient documentation

## 2017-03-17 DIAGNOSIS — Z888 Allergy status to other drugs, medicaments and biological substances status: Secondary | ICD-10-CM | POA: Diagnosis not present

## 2017-03-17 DIAGNOSIS — H409 Unspecified glaucoma: Secondary | ICD-10-CM | POA: Diagnosis not present

## 2017-03-17 DIAGNOSIS — Z6841 Body Mass Index (BMI) 40.0 and over, adult: Secondary | ICD-10-CM | POA: Diagnosis not present

## 2017-03-17 DIAGNOSIS — G4733 Obstructive sleep apnea (adult) (pediatric): Secondary | ICD-10-CM | POA: Insufficient documentation

## 2017-03-17 DIAGNOSIS — R079 Chest pain, unspecified: Secondary | ICD-10-CM | POA: Diagnosis not present

## 2017-03-17 DIAGNOSIS — I10 Essential (primary) hypertension: Secondary | ICD-10-CM | POA: Insufficient documentation

## 2017-03-17 DIAGNOSIS — E785 Hyperlipidemia, unspecified: Secondary | ICD-10-CM | POA: Diagnosis not present

## 2017-03-17 DIAGNOSIS — Y832 Surgical operation with anastomosis, bypass or graft as the cause of abnormal reaction of the patient, or of later complication, without mention of misadventure at the time of the procedure: Secondary | ICD-10-CM | POA: Diagnosis not present

## 2017-03-17 DIAGNOSIS — R0789 Other chest pain: Secondary | ICD-10-CM | POA: Diagnosis not present

## 2017-03-17 DIAGNOSIS — Z79899 Other long term (current) drug therapy: Secondary | ICD-10-CM | POA: Diagnosis not present

## 2017-03-17 LAB — CBC
HCT: 36.6 % — ABNORMAL LOW (ref 40.0–52.0)
Hemoglobin: 12.6 g/dL — ABNORMAL LOW (ref 13.0–18.0)
MCH: 32.2 pg (ref 26.0–34.0)
MCHC: 34.5 g/dL (ref 32.0–36.0)
MCV: 93.5 fL (ref 80.0–100.0)
PLATELETS: 201 10*3/uL (ref 150–440)
RBC: 3.91 MIL/uL — ABNORMAL LOW (ref 4.40–5.90)
RDW: 13.3 % (ref 11.5–14.5)
WBC: 9.1 10*3/uL (ref 3.8–10.6)

## 2017-03-17 LAB — BASIC METABOLIC PANEL
Anion gap: 9 (ref 5–15)
BUN: 24 mg/dL — AB (ref 6–20)
CALCIUM: 8.8 mg/dL — AB (ref 8.9–10.3)
CHLORIDE: 99 mmol/L — AB (ref 101–111)
CO2: 28 mmol/L (ref 22–32)
Creatinine, Ser: 1.17 mg/dL (ref 0.61–1.24)
GFR calc Af Amer: >60 mL/min (ref >60)
GFR calc non Af Amer: >60 mL/min (ref >60)
Glucose, Bld: 345 mg/dL — ABNORMAL HIGH (ref 65–99)
Potassium: 3.7 mmol/L (ref 3.5–5.1)
SODIUM: 136 mmol/L (ref 135–145)

## 2017-03-17 LAB — TROPONIN I

## 2017-03-17 MED ORDER — NITROGLYCERIN 0.4 MG SL SUBL
0.4000 mg | SUBLINGUAL_TABLET | SUBLINGUAL | Status: DC | PRN
  Administered 2017-03-17 (×2): 0.4 mg via SUBLINGUAL

## 2017-03-17 MED ORDER — ACETAMINOPHEN 325 MG PO TABS
ORAL_TABLET | ORAL | Status: AC
  Administered 2017-03-17: 650 mg via ORAL
  Filled 2017-03-17: qty 2

## 2017-03-17 MED ORDER — ACETAMINOPHEN 325 MG PO TABS
650.0000 mg | ORAL_TABLET | Freq: Once | ORAL | Status: AC
  Administered 2017-03-17: 650 mg via ORAL

## 2017-03-17 MED ORDER — NITROGLYCERIN 0.4 MG SL SUBL
SUBLINGUAL_TABLET | SUBLINGUAL | Status: AC
  Administered 2017-03-17: 0.4 mg via SUBLINGUAL
  Filled 2017-03-17: qty 2

## 2017-03-17 NOTE — H&P (Signed)
Lawrence [name] Admission History and Physical Lawrence [name], D.O.  ---------------------------------------------------------------------------------------------------------------------   PATIENT NAME: Lawrence [name] MR#: [id number] DATE OF BIRTH: 03/02/1951 DATE OF ADMISSION: 03/17/2017 PRIMARY CARE PHYSICIAN: Lawrence Koch, NP  REQUESTING/REFERRING PHYSICIAN: ED Dr. Owens [name]  CHIEF COMPLAINT: Chief Complaint  Patient presents with  . Chest Pain    HISTORY OF PRESENT ILLNESS: Lawrence [name] is a 66 y.o. male with a known history of Coronary artery disease status post quadruple bypass in August 2017 and cardiac catheterization March 2018, diabetes, hypertension, hyperlipidemia, osteoarthritis, sleep apnea presents to the emergency department for evaluation of pain.  Patient was in a usual state of health until this evening when he developed central chest pain described as squeezing, nonradiating, associated with some anxiety, mild nausea and dizziness. Pain was relieved with nitroglycerin and is now 1/10.   Patient states that his symptoms felt worse than his previous MI. Of note he had an MI in 2008. He had a quadruple bypass at [name] August 2017 in March 2018 he underwent repeat catheterization with Lawrence [name] which revealed 100% restenosis of the bypass vessels.  Otherwise there has been no change in status. Patient has been taking medication as prescribed and there has been no recent change in medication or diet.  There has been no recent illness, travel or sick contacts.    Patient denies fevers/chills, weakness, dizziness, shortness of breath, N/V/C/D, abdominal pain, dysuria/frequency, changes in mental status.   EMS/ED COURSE:   Patient received aspirin at home, nitro in ambulance  PAST MEDICAL HISTORY: Past Medical History:  Diagnosis Date  . CAD (coronary artery disease)    07/2007  . Diabetes ([place])   . Elevated lipids   . Glaucoma   .  HBP (high blood pressure)   . Neuropathy   . Osteoarthritis   . Skin cancer   . Sleep apnea    CPAP   Problem Noted Date  Bilateral carotid artery stenosis 12/10/2016  Overview:   Less than 50%2017   Toe ulcer, left, limited to breakdown of skin (CMS-HCC) 11/11/2016  CAD (coronary artery disease) 08/18/2016  Overview:   Sp cabg 2017 with lima to lad, sequental grafts to OM1 and OM2, and svg to RCA with occluded graft to RCA 2018 with good collaterals   Status post total left knee replacement 03/28/2016  Morbid obesity with BMI of 40.0-44.9, adult (CMS-HCC) 11/05/2015  Gastro-esophageal reflux disease without esophagitis 08/28/2015  Overview:   Last Assessment & Plan:  Managed on prilosec with improvement.   Bilateral leg edema 05/22/2015  Benign essential hypertension 05/18/2015  History of MI (myocardial infarction)   Mixed hyperlipidemia   Diabetes mellitus type 2, uncomplicated (CMS-HCC)   Sleep apnea        PAST SURGICAL HISTORY: Past Surgical History:  Procedure Laterality Date  . APPENDECTOMY    . CARDIAC CATHETERIZATION N/A 06/11/2016   Procedure: Left Heart Cath and Coronary Angiography;  Surgeon: Lawrence Skains, MD;  Location: [place] [facility];  Service: Cardiovascular;  Laterality: N/A;  . CARPAL TUNNEL RELEASE Left 04/24/2016   Procedure: CARPAL TUNNEL RELEASE;  Surgeon: Lawrence Knows, MD;  Location: [facility];  Service: Orthopedics;  Laterality: Left;  . CATARACT EXTRACTION W/ INTRAOCULAR LENS  IMPLANT, BILATERAL Bilateral   . CATARACT EXTRACTION, BILATERAL    . CORONARY ARTERY BYPASS GRAFT N/A 06/27/2016   Procedure: CORONARY ARTERY BYPASS GRAFTING times four using left internal mammary artery and right leg saphenous vein;  Surgeon: Lawrence [name]  Lawrence Gum, MD;  Location: [place];  Service: Open Heart Surgery;  Laterality: N/A;  . EYE SURGERY Bilateral   . JOINT REPLACEMENT    . KNEE ARTHROPLASTY Left 02/11/2016   Procedure: COMPUTER ASSISTED TOTAL KNEE  ARTHROPLASTY;  Surgeon: Lawrence Leep, MD;  Location: [facility];  Service: Orthopedics;  Laterality: Left;  . KNEE ARTHROSCOPY Right   . LEFT HEART CATH AND CORS/GRAFTS ANGIOGRAPHY Left 01/13/2017   Procedure: Left Heart Cath and Cors/Grafts Angiography;  Surgeon: Lawrence Skains, MD;  Location: [place] [facility];  Service: Cardiovascular;  Laterality: Left;  . TEE WITHOUT CARDIOVERSION N/A 06/27/2016   Procedure: TRANSESOPHAGEAL ECHOCARDIOGRAM (TEE);  Surgeon: Lawrence Poot, MD;  Location: [place];  Service: Open Heart Surgery;  Laterality: N/A;      SOCIAL HISTORY: Social History  Substance Use Topics  . Smoking status: Former Smoker    Packs/day: 1.00    Years: 16.00    Types: Cigarettes    Start date: 11/03/1969    Quit date: 07/22/1986  . Smokeless tobacco: Never Used  . Alcohol use 0.0 oz/week     Comment: OCCASIONALLY      FAMILY HISTORY: Family History  Problem Relation Age of Onset  . Hypertension Mother   . Lupus Mother   . Hypertension Father   . Diabetes Father   . Heart attack Father 52       died in his 66s  . Arthritis Maternal Grandmother   . Arthritis Maternal Grandfather   . Arthritis Paternal Grandmother   . Arthritis Paternal Grandfather      MEDICATIONS AT HOME: Prior to Admission medications   Medication Sig Start Date End Date Taking? Authorizing Provider  aspirin 81 MG EC tablet Take 81 mg by mouth daily. Swallow whole.    [provider]  cholecalciferol (VITAMIN D) 1000 units tablet Take 1,000 Units by mouth daily.    [provider]  docusate sodium (COLACE) 100 MG capsule Take 200 mg by mouth 2 (two) times daily.    [provider]  fexofenadine (ALLEGRA) 180 MG tablet Take 180 mg by mouth daily.    [provider]  furosemide (LASIX) 40 MG tablet Take 1 tablet (40 mg total) by mouth daily. 07/03/16   Gold, Lawrence [name]  gabapentin (NEURONTIN) 100 MG capsule Take 100 mg by mouth 3 (three) times daily.     [provider]  glucagon (GLUCAGON EMERGENCY) 1 MG injection Inject 1 mg into the vein once as needed. Reported on 02/11/2016    [provider]  Hydrocodone-Acetaminophen 5-300 MG TABS Take 1 mg by mouth daily. 01/28/17   [name], DPM  insulin aspart (NOVOLOG) 100 UNIT/ML injection Inject 20 Units into the skin 3 (three) times daily. Patient taking differently: Inject 20 Units into the skin 3 (three) times daily with meals.  04/07/16   Lawrence Koch, NP  insulin glargine (LANTUS) 100 UNIT/ML injection Inject 20 Units into the skin at bedtime.     [provider]  latanoprost (XALATAN) 0.005 % ophthalmic solution Place 1 drop into both eyes at bedtime.    [provider]  metoprolol tartrate (LOPRESSOR) 25 MG tablet Take 25 mg by mouth 2 (two) times daily.    [provider]  Multiple Vitamin (MULTIVITAMIN) capsule Take 1 capsule by mouth daily.    [provider]  mupirocin ointment (BACTROBAN) 2 % Apply 1 application topically 2 (two) times daily. 01/16/17   Daylene Katayama  M, DPM  potassium chloride SA (K-DUR,KLOR-CON) 20 MEQ tablet Take 1 tablet (20 mEq total) by mouth daily. 07/03/16   Gold, [name] [name], [name]  rosuvastatin (CRESTOR) 20 MG tablet Take 1 tablet (20 mg total) by mouth daily at 6 PM. 07/03/16   Gold, [name] [name], [name]  sulfamethoxazole-trimethoprim (BACTRIM) 400-80 MG tablet Take 1 tablet by mouth 2 (two) times daily. 01/16/17   [name], DPM  vitamin C (ASCORBIC ACID) 500 MG tablet Take 500 mg by mouth 2 (two) times daily.     [provider]      DRUG ALLERGIES: Allergies  Allergen Reactions  . Nitroglycerin Nausea Only and Other (See Comments)    Patches only - headache  . Statins Other (See Comments)    BODY PAIN   . Lipitor [Atorvastatin] Other (See Comments) and Rash    BODY PAIN  . Lisinopril Other (See Comments)    Light headed, dizzy     REVIEW OF SYSTEMS: CONSTITUTIONAL: No fatigue, weakness,  fever, chills, weight gain/loss, headache. Positive dizziness EYES: No blurry or double vision. ENT: No tinnitus, postnasal drip, redness or soreness of the oropharynx. RESPIRATORY: No dyspnea, cough, wheeze, hemoptysis. CARDIOVASCULAR: Positive chest pain, negative orthopnea, palpitations, syncope. GASTROINTESTINAL: No nausea, vomiting, constipation, diarrhea, abdominal pain. No hematemesis, melena or hematochezia. GENITOURINARY: No dysuria, frequency, hematuria. ENDOCRINE: No polyuria or nocturia. No heat or cold intolerance. HEMATOLOGY: No anemia, bruising, bleeding. INTEGUMENTARY: No rashes, ulcers, lesions. MUSCULOSKELETAL: No pain, arthritis, swelling, gout. NEUROLOGIC: No numbness, tingling, weakness or ataxia. No seizure-type activity. PSYCHIATRIC: No anxiety, depression, insomnia.  PHYSICAL EXAMINATION: VITAL SIGNS: Blood pressure (!) 163/53, pulse 93, temperature 98.3 F (36.8 C), temperature source Oral, resp. rate 19, height 5' 10.5" (1.791 m), weight (!) 138.8 kg (306 lb), SpO2 98 %.  GENERAL: 66 y.o.-year-old male patient, well-developed, well-nourished lying in the bed in no acute distress.  Pleasant and cooperative.   HEENT: Head atraumatic, normocephalic. Pupils equal, round, reactive to light and accommodation. No scleral icterus. Extraocular muscles intact. Oropharynx is clear. Mucus membranes moist. NECK: Supple, full range of motion. No JVD, no bruit heard. No cervical lymphadenopathy. CHEST: Normal breath sounds bilaterally. No wheezing, rales, rhonchi or crackles. No use of accessory muscles of respiration.  No reproducible chest wall tenderness.  CARDIOVASCULAR: S1, S2 normal. No murmurs, rubs, or gallops appreciated. Cap refill <2 seconds. ABDOMEN: Soft, nontender, nondistended. No rebound, guarding, rigidity. Normoactive bowel sounds present in all four quadrants. No organomegaly or mass. EXTREMITIES: Full range of motion. Positive nonpitting pedal edema, negative  cyanosis, or clubbing. NEUROLOGIC: Cranial nerves II through XII are grossly intact with no focal sensorimotor deficit. Muscle strength 5/5 in all extremities. Sensation intact. Gait not checked. PSYCHIATRIC: The patient is alert and oriented x 3. Normal affect, mood, thought content. SKIN: Warm, dry, and intact without obvious rash, lesion, or ulcer.  LABORATORY PANEL:  CBC  Recent Labs Lab 03/17/17 1951  WBC 9.1  HGB 12.6*  HCT 36.6*  PLT 201   ----------------------------------------------------------------------------------------------------------------- Chemistries  Recent Labs Lab 03/17/17 1951  NA 136  K 3.7  CL 99*  CO2 28  GLUCOSE 345*  BUN 24*  CREATININE 1.17  CALCIUM 8.8*   ------------------------------------------------------------------------------------------------------------------ Cardiac Enzymes  Recent Labs Lab 03/17/17 1951  TROPONINI <0.03   ------------------------------------------------------------------------------------------------------------------  RADIOLOGY: Dg Chest 2 View  Result Date: 03/17/2017 CLINICAL DATA:  Chest pressure and pain starting at 1800 hours EXAM: CHEST  2 VIEW COMPARISON:  07/30/2016 FINDINGS: The heart size and mediastinal contours  are within normal limits. Median sternotomy sutures are noted. There is aortic atherosclerosis at the arch. Both lungs are clear. Minimal atelectasis is seen at the lung bases. No effusion or pneumothorax. The visualized skeletal structures are unremarkable. IMPRESSION: No active cardiopulmonary disease. Aortic atherosclerosis. Median sternotomy sutures are noted. Electronically Signed   By: Ashley Royalty M.D.   On: 03/17/2017 21:09    EKG: Normal sinus rhythm with normal axis and nonspecific ST-T wave changes.   IMPRESSION AND PLAN:  This is a 66 y.o. male with a history of CAD, DM, HLD, OA, OSA now being admitted with:  #. Chest pain, rule out ACS - Admit to observation with  telemetry monitoring. - Trend troponins, check lipids and TSH. - Morphine, beta blocker, aspirin and statin ordered.  Allergy to nitro - Check echo - Cardiology consult requested - Lawrence [name]  #. HLD - Continue Crestor  #. H/o Diabetes - Accuchecks q4h with RISS coverage - NPO - Continue Lantus   Admission status: Observation, telemetry Diet/Nutrition: Heart healthy Fluids: HL DVT Px: Lovenox, SCDs and early ambulation Code Status: Full Disposition Plan: To home in <24 hours  All the records are reviewed and case discussed with ED provider. Management plans discussed with the patient and/or family who express understanding and agree with plan of care.   TOTAL TIME TAKING CARE OF THIS PATIENT: 60 minutes.   Alexis Hugelmeyer D.O. on 03/17/2017 at 11:58 PM Between 7am to 6pm - Pager - [phone] After 6pm go to [email or web address] - [employer] [facility] Office [phone] CC: Primary care physician; Lawrence Koch, NP     Note: This dictation was prepared with Dragon dictation along with smaller phrase technology. Any transcriptional errors that result from this process are unintentional.

## 2017-03-17 NOTE — ED Provider Notes (Signed)
[facility] Emergency Department Provider Note  Time seen:   I have reviewed the triage vital signs and the nursing notes.   HISTORY  Chief Complaint Chest Pain    HPI Lawrence [name] is a 66 y.o. male with below list  of chronic medical conditions including myocardial infarction 2, 4 vessel coronary artery bypass August 2017, catheterization performed in March revealing multiple significant vessels occluded presents to the emergency department with central chest pressure which began at 6:00 PM. The patient also admits to dizziness but states that this is his baseline. He states his initial pain score was 8 out of 10 but following receiving 1 spray of nitroglycerin his pain is improved to 5 out of 10. Patient denies any dyspnea no luck 70 pain or swelling. Patient was given aspirin via EMS   Past Medical History:  Diagnosis Date  . CAD (coronary artery disease)    07/2007  . Diabetes ([place])   . Elevated lipids   . Glaucoma   . HBP (high blood pressure)   . Neuropathy   . Osteoarthritis   . Skin cancer   . Sleep apnea    CPAP    Patient Active Problem List   Diagnosis Date Noted  . S/P CABG (coronary artery bypass graft) 06/27/2016  . Unstable angina ([place]) 06/24/2016  . Controlled type 1 diabetes mellitus without complication ([place]) 32/10/2481  . Combined fat and carbohydrate induced hyperlipemia 04/04/2016  . Myocardial infarction ([place]) 04/04/2016  . H/O total knee replacement 03/28/2016  . S/P total knee arthroplasty 02/11/2016  . Morbid (severe) obesity due to excess calories ([place]) 11/05/2015  . Arthritis of knee, degenerative 11/05/2015  . Hyperlipidemia 08/28/2015  . Type 1 diabetes mellitus ([place]) 08/28/2015  . CAD (coronary artery disease) 08/28/2015  . Lower extremity edema 08/28/2015  . GERD (gastroesophageal reflux disease) 08/28/2015  . OSA (obstructive sleep apnea) 08/28/2015  . Morbid obesity with BMI of 40.0-44.9, adult ([place])  08/28/2015  . Benign essential HTN 05/18/2015    Past Surgical History:  Procedure Laterality Date  . APPENDECTOMY    . CARDIAC CATHETERIZATION N/A 06/11/2016   Procedure: Left Heart Cath and Coronary Angiography;  Surgeon: Corey Skains, MD;  Location: [facility];  Service: Cardiovascular;  Laterality: N/A;  . CARPAL TUNNEL RELEASE Left 04/24/2016   Procedure: CARPAL TUNNEL RELEASE;  Surgeon: Hessie Knows, MD;  Location: [facility] OR[facility];  Service: Orthopedics;  Laterality: Left;  . CATARACT EXTRACTION W/ INTRAOCULAR LENS  IMPLANT, BILATERAL Bilateral   . CATARACT EXTRACTION, BILATERAL    . CORONARY ARTERY BYPASS GRAFT N/A 06/27/2016   Procedure: CORONARY ARTERY BYPASS GRAFTING times four using left internal mammary artery and right leg saphenous vein;  Surgeon: Ivin Poot, MD;  Location: [facility] [place];  Service: Open Heart Surgery;  Laterality: N/A;  . EYE SURGERY Bilateral   . JOINT REPLACEMENT    . KNEE ARTHROPLASTY Left 02/11/2016   Procedure: COMPUTER ASSISTED TOTAL KNEE ARTHROPLASTY;  Surgeon: Dereck Leep, MD;  Location: [facility];  Service: Orthopedics;  Laterality: Left;  . KNEE ARTHROSCOPY Right   . LEFT HEART CATH AND CORS/GRAFTS ANGIOGRAPHY Left 01/13/2017   Procedure: Left Heart Cath and Cors/Grafts Angiography;  Surgeon: Corey Skains, MD;  Location: [place] [facility];  Service: Cardiovascular;  Laterality: Left;  . TEE WITHOUT CARDIOVERSION N/A 06/27/2016   Procedure: TRANSESOPHAGEAL ECHOCARDIOGRAM (TEE);  Surgeon: Ivin Poot, MD;  Location: [place];  Service: Open Heart Surgery;  Laterality:  N/A;    Prior to Admission medications   Medication Sig Start Date End Date Taking? Authorizing Provider  aspirin 81 MG EC tablet Take 81 mg by mouth daily. Swallow whole.    [provider]  cholecalciferol (VITAMIN D) 1000 units tablet Take 1,000 Units by mouth daily.    [provider]  docusate sodium (COLACE) 100 MG capsule Take 200 mg by mouth 2 (two)  times daily.    [provider]  fexofenadine (ALLEGRA) 180 MG tablet Take 180 mg by mouth daily.    [provider]  furosemide (LASIX) 40 MG tablet Take 1 tablet (40 mg total) by mouth daily. 07/03/16   Gold, [name] E, PA-C  gabapentin (NEURONTIN) 100 MG capsule Take 100 mg by mouth 3 (three) times daily.    [provider]  glucagon (GLUCAGON EMERGENCY) 1 MG injection Inject 1 mg into the vein once as needed. Reported on 02/11/2016    [provider]  Hydrocodone-Acetaminophen 5-300 MG TABS Take 1 mg by mouth daily. 01/28/17   [name], DPM  insulin aspart (NOVOLOG) 100 UNIT/ML injection Inject 20 Units into the skin 3 (three) times daily. Patient taking differently: Inject 20 Units into the skin 3 (three) times daily with meals.  04/07/16   Pleas Koch, NP  insulin glargine (LANTUS) 100 UNIT/ML injection Inject 20 Units into the skin at bedtime.     [provider]  latanoprost (XALATAN) 0.005 % ophthalmic solution Place 1 drop into both eyes at bedtime.    [provider]  metoprolol tartrate (LOPRESSOR) 25 MG tablet Take 25 mg by mouth 2 (two) times daily.    [provider]  Multiple Vitamin (MULTIVITAMIN) capsule Take 1 capsule by mouth daily.    [provider]  mupirocin ointment (BACTROBAN) 2 % Apply 1 application topically 2 (two) times daily. 01/16/17   [name], DPM  potassium chloride SA (K-DUR,KLOR-CON) 20 MEQ tablet Take 1 tablet (20 mEq total) by mouth daily. 07/03/16   Gold, [name] E, PA-C  rosuvastatin (CRESTOR) 20 MG tablet Take 1 tablet (20 mg total) by mouth daily at 6 PM. 07/03/16   Gold, [name] E, PA-C  sulfamethoxazole-trimethoprim (BACTRIM) 400-80 MG tablet Take 1 tablet by mouth 2 (two) times daily. 01/16/17   [name], DPM  vitamin C (ASCORBIC ACID) 500 MG tablet Take 500 mg by mouth 2 (two) times daily.     [provider]    Allergies Nitroglycerin; Statins; Lipitor  [atorvastatin]; and Lisinopril  Family History  Problem Relation Age of Onset  . Hypertension Mother   . Lupus Mother   . Hypertension Father   . Diabetes Father   . Heart attack Father 74       died in his 65s  . Arthritis Maternal Grandmother   . Arthritis Maternal Grandfather   . Arthritis Paternal Grandmother   . Arthritis Paternal Grandfather     Social History Social History  Substance Use Topics  . Smoking status: Former Smoker    Packs/day: 1.00    Years: 16.00    Types: Cigarettes    Start date: 11/03/1969    Quit date: 07/22/1986  . Smokeless tobacco: Never Used  . Alcohol use 0.0 oz/week     Comment: OCCASIONALLY    Review of Systems Constitutional: No fever/chills Eyes: No visual changes. ENT: No sore throat. Cardiovascular: Positive for chest pain. Respiratory: Denies shortness of breath. Gastrointestinal: No abdominal pain.  No nausea, no  vomiting.  No diarrhea.  No constipation. Genitourinary: Negative for dysuria. Musculoskeletal: Negative for back pain. Integumentary: Negative for rash. Neurological: Negative for headaches, focal weakness or numbness.   ____________________________________________   PHYSICAL EXAM:  VITAL SIGNS: ED Triage Vitals  Enc Vitals Group     BP 03/17/17 1948 (!) 155/60     Pulse Rate 03/17/17 1948 94     Resp 03/17/17 1948 20     Temp 03/17/17 1948 98.3 F (36.8 C)     Temp Source 03/17/17 1948 Oral     SpO2 03/17/17 1948 97 %     Weight 03/17/17 1948 (!) 306 lb (138.8 kg)     Height 03/17/17 1948 5' 10.5" (1.791 m)     Head Circumference --      Peak Flow --      Pain Score 03/17/17 1947 6     Pain Loc --      Pain Edu? --      Excl. in [place]? --     Constitutional: Alert and oriented. Well appearing and in no acute distress. Eyes: Conjunctivae are normal. PERRL. EOMI. Head: Atraumatic. Mouth/Throat: Mucous membranes are moist.  Oropharynx non-erythematous. Neck: No stridor.   Cardiovascular: Normal rate,  regular rhythm. Good peripheral circulation. Grossly normal heart sounds. Respiratory: Normal respiratory effort.  No retractions. Lungs CTAB. Gastrointestinal: Soft and nontender. No distention.  Musculoskeletal: No lower extremity tenderness nor edema. No gross deformities of extremities. Neurologic:  Normal speech and language. No gross focal neurologic deficits are appreciated.  Skin:  Skin is warm, dry and intact. No rash noted. Psychiatric: Mood and affect are normal. Speech and behavior are normal.  ____________________________________________   LABS (all labs ordered are listed, but only abnormal results are displayed)  Labs Reviewed  BASIC METABOLIC PANEL - Abnormal; Notable for the following:       Result Value   Chloride 99 (*)    Glucose, Bld 345 (*)    BUN 24 (*)    Calcium 8.8 (*)    All other components within normal limits  CBC - Abnormal; Notable for the following:    RBC 3.91 (*)    Hemoglobin 12.6 (*)    HCT 36.6 (*)    All other components within normal limits  TROPONIN I   ____________________________________________  EKG  ED ECG REPORT I, [place] N Tydarius Yawn, the attending physician, personally viewed and interpreted this ECG.   Date: 03/19/2017  EKG Time: 7:50 PM  Rate: 87  Rhythm: Normal sinus rhythm  Axis: Normal  Intervals: Normal  ST&T Change: None  ____________________________________________  RADIOLOGY I, [place] N Lesa Vandall, personally viewed and evaluated these images (plain radiographs) as part of my medical decision making, as well as reviewing the written report by the radiologist.  CLINICAL DATA:  Chest pressure and pain starting at 1800 hours  EXAM: CHEST  2 VIEW  COMPARISON:  07/30/2016  FINDINGS: The heart size and mediastinal contours are within normal limits. Median sternotomy sutures are noted. There is aortic atherosclerosis at the arch. Both lungs are clear. Minimal atelectasis is seen at the lung bases. No effusion or  pneumothorax. The visualized skeletal structures are unremarkable.  IMPRESSION: No active cardiopulmonary disease. Aortic atherosclerosis. Median sternotomy sutures are noted.   Electronically Signed   By: Ashley Royalty M.D.   On: 03/17/2017 21:09 ____________________________________________   Procedures   ____________________________________________   INITIAL IMPRESSION / ASSESSMENT AND PLAN / ED COURSE  Pertinent labs & imaging results that were available  during my care of the patient were reviewed by me and considered in my medical decision making (see chart for details).  EKG revealed no evidence of ischemia or infarction. Troponin negative times one. Patient given another sublingual nitroglycerin with improvement of pain. Given history and physical exam concern for potential cardiac etiology for the patient's chest pain and a such patient was discussed with Dr. Pamalee Leyden for hospital admission for further evaluation and management.      ____________________________________________  FINAL CLINICAL IMPRESSION(S) / ED DIAGNOSES  Final diagnoses:  Chest pain, unspecified type     MEDICATIONS GIVEN DURING THIS VISIT:  Medications  nitroGLYCERIN (NITROSTAT) SL tablet 0.4 mg (0.4 mg Sublingual Given 03/17/17 2006)  acetaminophen (TYLENOL) tablet 650 mg (650 mg Oral Given 03/17/17 2039)     NEW OUTPATIENT MEDICATIONS STARTED DURING THIS VISIT:  New Prescriptions   No medications on file    Modified Medications   No medications on file    Discontinued Medications   No medications on file     Note:  This document was prepared using Dragon voice recognition software and may include unintentional dictation errors.    Gregor Hams, MD 03/19/17 [id number]

## 2017-03-17 NOTE — ED Triage Notes (Signed)
Pt arrived to ED from home reporting chest pressure and pain beginning at 1800. Pt reports only other symptom was dizziness but reports he "stays dizzy." Pt took 4 baby aspirin at home and EMS reports having given 1 Nitro in route. Pain reportedly decreased to a 6/10 after Nitro. Pt has hx of quad bi pass in August 2017 and a heart attack in 2008. Pt reports this pain is "as bad as the other heart attacks."   Pt is diabetic and reports having taken insulin as prescribed but also reports having a CBG of 448 today.

## 2017-03-18 DIAGNOSIS — I10 Essential (primary) hypertension: Secondary | ICD-10-CM | POA: Diagnosis not present

## 2017-03-18 DIAGNOSIS — R079 Chest pain, unspecified: Secondary | ICD-10-CM | POA: Diagnosis not present

## 2017-03-18 DIAGNOSIS — I2510 Atherosclerotic heart disease of native coronary artery without angina pectoris: Secondary | ICD-10-CM | POA: Diagnosis not present

## 2017-03-18 DIAGNOSIS — I25798 Atherosclerosis of other coronary artery bypass graft(s) with other forms of angina pectoris: Secondary | ICD-10-CM | POA: Diagnosis not present

## 2017-03-18 DIAGNOSIS — I214 Non-ST elevation (NSTEMI) myocardial infarction: Secondary | ICD-10-CM | POA: Diagnosis not present

## 2017-03-18 DIAGNOSIS — E785 Hyperlipidemia, unspecified: Secondary | ICD-10-CM | POA: Diagnosis not present

## 2017-03-18 LAB — CBC
HCT: 35.1 % — ABNORMAL LOW (ref 40.0–52.0)
HEMATOCRIT: 38.2 % — AB (ref 40.0–52.0)
HEMOGLOBIN: 12.2 g/dL — AB (ref 13.0–18.0)
HEMOGLOBIN: 13.1 g/dL (ref 13.0–18.0)
MCH: 32.2 pg (ref 26.0–34.0)
MCH: 31.4 pg (ref 26.0–34.0)
MCHC: 34.9 g/dL (ref 32.0–36.0)
MCHC: 34.4 g/dL (ref 32.0–36.0)
MCV: 92.3 fL (ref 80.0–100.0)
MCV: 91.5 fL (ref 80.0–100.0)
Platelets: 197 10*3/uL (ref 150–440)
Platelets: 213 10*3/uL (ref 150–440)
RBC: 3.8 MIL/uL — ABNORMAL LOW (ref 4.40–5.90)
RBC: 4.18 MIL/uL — ABNORMAL LOW (ref 4.40–5.90)
RDW: 13.3 % (ref 11.5–14.5)
RDW: 13.6 % (ref 11.5–14.5)
WBC: 8.3 10*3/uL (ref 3.8–10.6)
WBC: 8.8 10*3/uL (ref 3.8–10.6)

## 2017-03-18 LAB — GLUCOSE, CAPILLARY
GLUCOSE-CAPILLARY: 282 mg/dL — AB (ref 65–99)
Glucose-Capillary: 288 mg/dL — ABNORMAL HIGH (ref 65–99)
Glucose-Capillary: 102 mg/dL — ABNORMAL HIGH (ref 65–99)
Glucose-Capillary: 370 mg/dL — ABNORMAL HIGH (ref 65–99)
Glucose-Capillary: 330 mg/dL — ABNORMAL HIGH (ref 65–99)
Glucose-Capillary: 209 mg/dL — ABNORMAL HIGH (ref 65–99)

## 2017-03-18 LAB — PROTIME-INR
INR: 1.05
Prothrombin Time: 13.7 seconds (ref 11.4–15.2)

## 2017-03-18 LAB — BASIC METABOLIC PANEL
ANION GAP: 9 (ref 5–15)
BUN: 19 mg/dL (ref 6–20)
CHLORIDE: 107 mmol/L (ref 101–111)
CO2: 24 mmol/L (ref 22–32)
Calcium: 8.9 mg/dL (ref 8.9–10.3)
Creatinine, Ser: 0.93 mg/dL (ref 0.61–1.24)
GFR calc non Af Amer: >60 mL/min (ref >60)
GLUCOSE: 103 mg/dL — AB (ref 65–99)
Potassium: 4.3 mmol/L (ref 3.5–5.1)
Sodium: 140 mmol/L (ref 135–145)

## 2017-03-18 LAB — CREATININE, SERUM: CREATININE: 1.07 mg/dL (ref 0.61–1.24)

## 2017-03-18 LAB — LIPID PANEL
Cholesterol: 143 mg/dL (ref 0–200)
HDL: 67 mg/dL (ref >40)
LDL CALC: 63 mg/dL (ref 0–99)
TRIGLYCERIDES: 63 mg/dL (ref <150)
Total CHOL/HDL Ratio: 2.1 RATIO
VLDL: 13 mg/dL (ref 0–40)

## 2017-03-18 LAB — TROPONIN I
Troponin I: 1.57 ng/mL (ref <0.03)
Troponin I: 2.19 ng/mL (ref <0.03)
Troponin I: 1.6 ng/mL (ref <0.03)

## 2017-03-18 LAB — BRAIN NATRIURETIC PEPTIDE: B Natriuretic Peptide: 93 pg/mL (ref 0.0–100.0)

## 2017-03-18 MED ORDER — ADULT MULTIVITAMIN W/MINERALS CH
1.0000 | ORAL_TABLET | Freq: Every day | ORAL | Status: DC
  Administered 2017-03-18 – 2017-03-19 (×2): 1 via ORAL
  Filled 2017-03-18 (×2): qty 1

## 2017-03-18 MED ORDER — ENOXAPARIN SODIUM 40 MG/0.4ML ~~LOC~~ SOLN: 40.0000 mg | Freq: Two times a day (BID) | SUBCUTANEOUS | Status: DC

## 2017-03-18 MED ORDER — INSULIN ASPART 100 UNIT/ML ~~LOC~~ SOLN
7.0000 [IU] | Freq: Three times a day (TID) | SUBCUTANEOUS | Status: DC
  Administered 2017-03-19: 7 [IU] via SUBCUTANEOUS
  Filled 2017-03-18: qty 7

## 2017-03-18 MED ORDER — POTASSIUM CHLORIDE CRYS ER 20 MEQ PO TBCR
20.0000 meq | EXTENDED_RELEASE_TABLET | Freq: Every day | ORAL | Status: DC
  Administered 2017-03-18 – 2017-03-19 (×2): 20 meq via ORAL
  Filled 2017-03-18 (×2): qty 1

## 2017-03-18 MED ORDER — INSULIN ASPART 100 UNIT/ML ~~LOC~~ SOLN
0.0000 [IU] | SUBCUTANEOUS | Status: DC
  Administered 2017-03-18: 11 [IU] via SUBCUTANEOUS
  Administered 2017-03-18: 7 [IU] via SUBCUTANEOUS
  Administered 2017-03-18: 20 [IU] via SUBCUTANEOUS
  Administered 2017-03-18: 15 [IU] via SUBCUTANEOUS
  Administered 2017-03-18: 11 [IU] via SUBCUTANEOUS
  Administered 2017-03-19: 7 [IU] via SUBCUTANEOUS
  Filled 2017-03-18: qty 15
  Filled 2017-03-18: qty 11
  Filled 2017-03-18: qty 20
  Filled 2017-03-18 (×2): qty 11
  Filled 2017-03-18 (×2): qty 7

## 2017-03-18 MED ORDER — DOCUSATE SODIUM 100 MG PO CAPS
200.0000 mg | ORAL_CAPSULE | Freq: Two times a day (BID) | ORAL | Status: DC
  Administered 2017-03-18 – 2017-03-19 (×4): 200 mg via ORAL
  Filled 2017-03-18 (×4): qty 2

## 2017-03-18 MED ORDER — ONDANSETRON HCL 4 MG/2ML IJ SOLN: 4.0000 mg | Freq: Four times a day (QID) | INTRAMUSCULAR | Status: DC | PRN

## 2017-03-18 MED ORDER — ASPIRIN EC 81 MG PO TBEC: 81.0000 mg | DELAYED_RELEASE_TABLET | Freq: Every day | ORAL | Status: DC

## 2017-03-18 MED ORDER — VITAMIN D 1000 UNITS PO TABS
1000.0000 [IU] | ORAL_TABLET | Freq: Every day | ORAL | Status: DC
  Administered 2017-03-18 – 2017-03-19 (×2): 1000 [IU] via ORAL
  Filled 2017-03-18 (×2): qty 1

## 2017-03-18 MED ORDER — ASPIRIN EC 81 MG PO TBEC
81.0000 mg | DELAYED_RELEASE_TABLET | Freq: Every day | ORAL | Status: DC
  Administered 2017-03-18 – 2017-03-19 (×2): 81 mg via ORAL
  Filled 2017-03-18 (×2): qty 1

## 2017-03-18 MED ORDER — HYDROCODONE-ACETAMINOPHEN 10-325 MG PO TABS
1.0000 | ORAL_TABLET | Freq: Every day | ORAL | Status: DC
  Administered 2017-03-18: 1 via ORAL
  Filled 2017-03-18 (×2): qty 1

## 2017-03-18 MED ORDER — SODIUM CHLORIDE 0.9 % IV SOLN: 250.0000 mL | INTRAVENOUS | Status: DC | PRN

## 2017-03-18 MED ORDER — ACETAMINOPHEN 325 MG PO TABS
650.0000 mg | ORAL_TABLET | ORAL | Status: DC | PRN
  Administered 2017-03-18 – 2017-03-19 (×2): 650 mg via ORAL
  Filled 2017-03-18 (×2): qty 2

## 2017-03-18 MED ORDER — ZOLPIDEM TARTRATE 5 MG PO TABS: 5.0000 mg | ORAL_TABLET | Freq: Every evening | ORAL | Status: DC | PRN

## 2017-03-18 MED ORDER — INSULIN GLARGINE 100 UNIT/ML ~~LOC~~ SOLN
20.0000 [IU] | Freq: Every day | SUBCUTANEOUS | Status: DC
  Administered 2017-03-18: 20 [IU] via SUBCUTANEOUS
  Filled 2017-03-18 (×2): qty 0.2

## 2017-03-18 MED ORDER — VITAMIN C 500 MG PO TABS
500.0000 mg | ORAL_TABLET | Freq: Two times a day (BID) | ORAL | Status: DC
  Administered 2017-03-18 – 2017-03-19 (×4): 500 mg via ORAL
  Filled 2017-03-18 (×4): qty 1

## 2017-03-18 MED ORDER — ENOXAPARIN SODIUM 150 MG/ML ~~LOC~~ SOLN
1.0000 mg/kg | Freq: Two times a day (BID) | SUBCUTANEOUS | Status: DC
  Administered 2017-03-18 – 2017-03-19 (×3): 140 mg via SUBCUTANEOUS
  Filled 2017-03-18 (×4): qty 0.92

## 2017-03-18 MED ORDER — INSULIN GLARGINE 100 UNIT/ML ~~LOC~~ SOLN
30.0000 [IU] | Freq: Every day | SUBCUTANEOUS | Status: DC
  Administered 2017-03-18: 30 [IU] via SUBCUTANEOUS
  Filled 2017-03-18 (×3): qty 0.3

## 2017-03-18 MED ORDER — MUPIROCIN 2 % EX OINT
1.0000 "application " | TOPICAL_OINTMENT | Freq: Two times a day (BID) | CUTANEOUS | Status: DC
  Filled 2017-03-18: qty 22

## 2017-03-18 MED ORDER — LORATADINE 10 MG PO TABS
10.0000 mg | ORAL_TABLET | Freq: Every day | ORAL | Status: DC
  Administered 2017-03-18 – 2017-03-19 (×2): 10 mg via ORAL
  Filled 2017-03-18 (×2): qty 1

## 2017-03-18 MED ORDER — GABAPENTIN 100 MG PO CAPS
100.0000 mg | ORAL_CAPSULE | Freq: Three times a day (TID) | ORAL | Status: DC
  Filled 2017-03-18 (×2): qty 1

## 2017-03-18 MED ORDER — MULTIVITAMINS PO CAPS: 1.0000 | ORAL_CAPSULE | Freq: Every day | ORAL | Status: DC

## 2017-03-18 MED ORDER — LATANOPROST 0.005 % OP SOLN
1.0000 [drp] | Freq: Every day | OPHTHALMIC | Status: DC
  Administered 2017-03-18 (×2): 1 [drp] via OPHTHALMIC
  Filled 2017-03-18: qty 2.5

## 2017-03-18 MED ORDER — FUROSEMIDE 40 MG PO TABS
40.0000 mg | ORAL_TABLET | Freq: Every day | ORAL | Status: DC
  Administered 2017-03-18 – 2017-03-19 (×2): 40 mg via ORAL
  Filled 2017-03-18 (×2): qty 1

## 2017-03-18 MED ORDER — SODIUM CHLORIDE 0.9% FLUSH: 3.0000 mL | INTRAVENOUS | Status: DC | PRN

## 2017-03-18 MED ORDER — METOPROLOL TARTRATE 25 MG PO TABS
25.0000 mg | ORAL_TABLET | Freq: Two times a day (BID) | ORAL | Status: DC
  Administered 2017-03-18 – 2017-03-19 (×4): 25 mg via ORAL
  Filled 2017-03-18 (×4): qty 1

## 2017-03-18 MED ORDER — SODIUM CHLORIDE 0.9% FLUSH
3.0000 mL | Freq: Two times a day (BID) | INTRAVENOUS | Status: DC
  Administered 2017-03-18 – 2017-03-19 (×3): 3 mL via INTRAVENOUS

## 2017-03-18 MED ORDER — ROSUVASTATIN CALCIUM 20 MG PO TABS
20.0000 mg | ORAL_TABLET | Freq: Every day | ORAL | Status: DC
  Administered 2017-03-18: 20 mg via ORAL
  Filled 2017-03-18: qty 1

## 2017-03-18 MED ORDER — ALPRAZOLAM 0.25 MG PO TABS: 0.2500 mg | ORAL_TABLET | Freq: Two times a day (BID) | ORAL | Status: DC | PRN

## 2017-03-18 NOTE — Progress Notes (Signed)
ANTICOAGULATION CONSULT NOTE - Initial Consult  Pharmacy Consult for Lovenox dosing Indication: chest pain/ACS  Allergies  Allergen Reactions  . Nitroglycerin Nausea Only and Other (See Comments)    Patches only - headache  . Statins Other (See Comments)    BODY PAIN   . Lipitor [Atorvastatin] Other (See Comments) and Rash    BODY PAIN  . Lisinopril Other (See Comments)    Light headed, dizzy    Patient Measurements: Height: 5' 10.5" (179.1 cm) Weight: (!) 303 lb 12.8 oz (137.8 kg) IBW/kg (Calculated) : 74.15 Enoxaparin Dosing Weight: 138 kg  Vital Signs: Temp: 98.6 F (37 C) (05/16 0138) Temp Source: Oral (05/16 0138) BP: 106/70 (05/16 0138) Pulse Rate: 86 (05/16 0138)  Labs:  Recent Labs  03/17/17 1951 03/18/17 0203  HGB 12.6* 12.2*  HCT 36.6* 35.1*  PLT 201 197  CREATININE 1.17 1.07  TROPONINI <0.03 1.57*    Estimated Creatinine Clearance: 93.1 mL/min (by C-G formula based on SCr of 1.07 mg/dL).   Medical History: Past Medical History:  Diagnosis Date  . CAD (coronary artery disease)    07/2007  . Diabetes ([place])   . Elevated lipids   . Glaucoma   . HBP (high blood pressure)   . Neuropathy   . Osteoarthritis   . Skin cancer   . Sleep apnea    CPAP    Medications:    Assessment:  Goal of Therapy  Monitor platelets by anticoagulation protocol: Yes    Plan:  Enoxaparin 1 mg/kg q 12 hours ordered. F/u labs per protocol.  Philomina Leon S 03/18/2017,3:41 AM

## 2017-03-18 NOTE — Care Management Obs Status (Signed)
[facility] NOTIFICATION   Patient Details  Name: Lawrence [name] MRN: [id number] Date of Birth: 04-Aug-1951   Medicare Observation Status Notification Given:  Yes    Katrina Stack, RN 03/18/2017, 9:23 AM

## 2017-03-18 NOTE — Progress Notes (Signed)
[facility] [place] NAME: Lawrence [name]    MR#:  [id number]  DATE OF BIRTH:  [birth year]  SUBJECTIVE:  CHIEF COMPLAINT:   Chief Complaint  Patient presents with  . Chest Pain    Hx of CABG with occluded graft, and good colaterals, with some small vessel dz, recent cath in march 2018- came with chest pain, have high troponin, no pain now. On heparin drip.  REVIEW OF SYSTEMS:  CONSTITUTIONAL: No fever, fatigue or weakness.  EYES: No blurred or double vision.  EARS, NOSE, AND THROAT: No tinnitus or ear pain.  RESPIRATORY: No cough, shortness of breath, wheezing or hemoptysis.  CARDIOVASCULAR: No chest pain, orthopnea, edema.  GASTROINTESTINAL: No nausea, vomiting, diarrhea or abdominal pain.  GENITOURINARY: No dysuria, hematuria.  ENDOCRINE: No polyuria, nocturia,  HEMATOLOGY: No anemia, easy bruising or bleeding SKIN: No rash or lesion. MUSCULOSKELETAL: No joint pain or arthritis.   NEUROLOGIC: No tingling, numbness, weakness.  PSYCHIATRY: No anxiety or depression.   ROS  DRUG ALLERGIES:   Allergies  Allergen Reactions  . Nitroglycerin Nausea Only and Other (See Comments)    Patches only - headache  . Statins Other (See Comments)    BODY PAIN   . Lipitor [Atorvastatin] Other (See Comments) and Rash    BODY PAIN  . Lisinopril Other (See Comments)    Light headed, dizzy    VITALS:  Blood pressure (!) 136/51, pulse 66, temperature 97.7 F (36.5 C), temperature source Oral, resp. rate 18, height 5' 10.5" (1.791 m), weight (!) 137.8 kg (303 lb 12.8 oz), SpO2 96 %.  PHYSICAL EXAMINATION:  GENERAL:  66 y.o.-year-old patient lying in the bed with no acute distress.  EYES: Pupils equal, round, reactive to light and accommodation. No scleral icterus. Extraocular muscles intact.  HEENT: Head atraumatic, normocephalic. Oropharynx and nasopharynx clear.  NECK:  Supple, no jugular venous distention. No thyroid enlargement, no tenderness.   LUNGS: Normal breath sounds bilaterally, no wheezing, rales,rhonchi or crepitation. No use of accessory muscles of respiration.  CARDIOVASCULAR: S1, S2 normal. No murmurs, rubs, or gallops.  ABDOMEN: Soft, nontender, nondistended. Bowel sounds present. No organomegaly or mass.  EXTREMITIES: No pedal edema, cyanosis, or clubbing.  NEUROLOGIC: Cranial nerves II through XII are intact. Muscle strength 5/5 in all extremities. Sensation intact. Gait not checked.  PSYCHIATRIC: The patient is alert and oriented x 3.  SKIN: No obvious rash, lesion, or ulcer.   Physical Exam LABORATORY PANEL:   CBC  Recent Labs Lab 03/18/17 0916  WBC 8.8  HGB 13.1  HCT 38.2*  PLT 213   ------------------------------------------------------------------------------------------------------------------  Chemistries   Recent Labs Lab 03/18/17 0916  NA 140  K 4.3  CL 107  CO2 24  GLUCOSE 103*  BUN 19  CREATININE 0.93  CALCIUM 8.9   ------------------------------------------------------------------------------------------------------------------  Cardiac Enzymes  Recent Labs Lab 03/18/17 0916 03/18/17 1334  TROPONINI 2.19* 1.60*   ------------------------------------------------------------------------------------------------------------------  RADIOLOGY:  Dg Chest 2 View  Result Date: 03/17/2017 CLINICAL DATA:  Chest pressure and pain starting at 1800 hours EXAM: CHEST  2 VIEW COMPARISON:  07/30/2016 FINDINGS: The heart size and mediastinal contours are within normal limits. Median sternotomy sutures are noted. There is aortic atherosclerosis at the arch. Both lungs are clear. Minimal atelectasis is seen at the lung bases. No effusion or pneumothorax. The visualized skeletal structures are unremarkable. IMPRESSION: No active cardiopulmonary disease. Aortic atherosclerosis. Median sternotomy sutures are noted. Electronically Signed   By: Meredith Leeds.D.  On: 03/17/2017 21:09    ASSESSMENT  AND PLAN:   Active Problems:   Chest pain, rule out acute myocardial infarction   * NSTEMI    heparine drip    Cont cardiac meds.    Appreciated help of Dr. Nehemiah Massed.     May not need interventions.  * HLD - Continue Crestor  * H/o Diabetes - Accuchecks q4h with RISS coverage - Continue Lantus - diabetic coardinator consult.    All the records are reviewed and case discussed with Care Management/Social Workerr. Management plans discussed with the patient, family and they are in agreement.  CODE STATUS: full  TOTAL TIME TAKING CARE OF THIS PATIENT: 35 minutes.    POSSIBLE D/C IN 1-2 DAYS, DEPENDING ON CLINICAL CONDITION.   Vaughan Basta M.D on 03/18/2017   Between 7am to 6pm - Pager - [phone]  After 6pm go to [email or web address] - password [email or web address] [facility]  Office  [phone]  CC: Primary care physician; Pleas Koch, NP  Note: This dictation was prepared with Dragon dictation along with smaller phrase technology. Any transcriptional errors that result from this process are unintentional.

## 2017-03-18 NOTE — Progress Notes (Signed)
Lovenox changed to 40 mg BID for BMI >40 and CrCl >30. 

## 2017-03-18 NOTE — Care Management (Signed)
Placed in observation for chest pain. Known cardiac disease with cabg. Second troponin 1.57.   Independent in all adls, denies issues accessing medical care, obtaining medications or with transportation.  Current with  PCP.

## 2017-03-18 NOTE — Progress Notes (Signed)
Patient's BG remains persistently high despite lunchtime coverage, patient also reported having issues with BG control at home, MD notified, consult placed to diabetes coordinator.

## 2017-03-18 NOTE — Progress Notes (Signed)
Troponin reported 1.57. MD Pyreddy made aware. Lovenox 140 mg Q 12h ordered.

## 2017-03-18 NOTE — Progress Notes (Signed)
Pt arrived from ED alert and oriented with wife at bedside. Pt has no SOB or c/o pain. Telemetry and skin verified with Scientist, research (physical sciences). No concerns offered at this time.

## 2017-03-18 NOTE — Consult Note (Signed)
Lawrence [name]  Patient ID: Lawrence [name], MRN: [id number], DOB/AGE: November 18, 1950 65 y.o. Admit date: 03/17/2017   Date of Consult: 03/18/2017 Primary Physician: Lawrence [name] Primary Cardiologist: Lawrence [name]  Chief Complaint:  Chief Complaint  Patient presents with  . Chest Pain   Reason for Consult: myocardial infarction with chest pain  HPI: 66 y.o. male with known coronary artery disease status post previous myocardial infarction and coronary artery bypass graft with three-vessel coronary artery disease essential hypertension mixed hyperlipidemia diabetes with complication and sleep apnea who is had new onset of severe substernal and left upper chest discomfort associated with shortness of breath weakness and fatigue consistent with a non-ST elevation myocardial infarction. When the patient arrived to the emergency room he received appropriate medication including nitrates which is resolved his symptoms. He does feel much better at this time and is hemodynamically stable without evidence of congestive heart failure. His peak troponin is 2.1 consistent with myocardial infarction. The patient recently has had a cardiac catheterization due to anginal symptoms and was placed on appropriate antianginal medication management to the maximum for which she is tolerated well. Recent cardiac catheterization showed occluded graft to right coronary artery and occluded native right coronary artery with good collaterals to PDA for which was not amenable to PCI and stent placement. Other grafts were patent and working well but the patient did have severe small vessel coronary artery disease which could be contributing to his myocardial infarction at this time  Past Medical History:  Diagnosis Date  . CAD (coronary artery disease)    07/2007  . Diabetes ([place])   . Elevated lipids   . Glaucoma   . HBP (high blood pressure)   . Neuropathy   . Osteoarthritis   . Skin  cancer   . Sleep apnea    CPAP      Surgical History:  Past Surgical History:  Procedure Laterality Date  . APPENDECTOMY    . CARDIAC CATHETERIZATION N/A 06/11/2016   Procedure: Left Heart Cath and Coronary Angiography;  Surgeon: Lawrence [name];  Location: [place] [facility];  Service: Cardiovascular;  Laterality: N/A;  . CARPAL TUNNEL RELEASE Left 04/24/2016   Procedure: CARPAL TUNNEL RELEASE;  Surgeon: Lawrence [name];  Location: [facility];  Service: Orthopedics;  Laterality: Left;  . CATARACT EXTRACTION W/ INTRAOCULAR LENS  IMPLANT, BILATERAL Bilateral   . CATARACT EXTRACTION, BILATERAL    . CORONARY ARTERY BYPASS GRAFT N/A 06/27/2016   Procedure: CORONARY ARTERY BYPASS GRAFTING times four using left internal mammary artery and right leg saphenous vein;  Surgeon: Lawrence [name];  Location: [place];  Service: Open Heart Surgery;  Laterality: N/A;  . EYE SURGERY Bilateral   . JOINT REPLACEMENT    . KNEE ARTHROPLASTY Left 02/11/2016   Procedure: COMPUTER ASSISTED TOTAL KNEE ARTHROPLASTY;  Surgeon: Dereck Leep, [name];  Location: [facility];  Service: Orthopedics;  Laterality: Left;  . KNEE ARTHROSCOPY Right   . LEFT HEART CATH AND CORS/GRAFTS ANGIOGRAPHY Left 01/13/2017   Procedure: Left Heart Cath and Cors/Grafts Angiography;  Surgeon: Lawrence [name];  Location: [place] [facility];  Service: Cardiovascular;  Laterality: Left;  . TEE WITHOUT CARDIOVERSION N/A 06/27/2016   Procedure: TRANSESOPHAGEAL ECHOCARDIOGRAM (TEE);  Surgeon: Lawrence [name];  Location: [place];  Service: Open Heart Surgery;  Laterality: N/A;     Home Meds: Prior to Admission medications   Medication Sig Start Date End Date Taking? Authorizing Provider  aspirin 81 MG EC tablet Take 81 mg by mouth daily. Swallow whole.   Yes Provider, Historical, [name]  cholecalciferol (VITAMIN D) 1000 units tablet Take 1,000 Units by mouth daily.   Yes Provider, Historical, [name]  docusate sodium (COLACE) 100 MG capsule Take  200 mg by mouth 2 (two) times daily.   Yes Provider, Historical, [name]  fexofenadine (ALLEGRA) 180 MG tablet Take 180 mg by mouth daily.   Yes Provider, Historical, [name]  furosemide (LASIX) 40 MG tablet Take 1 tablet (40 mg total) by mouth daily. 07/03/16  Yes Lawrence [name]  glucagon (GLUCAGON EMERGENCY) 1 MG injection Inject 1 mg into the vein once as needed. Reported on 02/11/2016   Yes Provider, Historical, [name]  insulin aspart (NOVOLOG) 100 UNIT/ML injection Inject 20 Units into the skin 3 (three) times daily. Patient taking differently: Inject 20 Units into the skin 3 (three) times daily with meals.  04/07/16  Yes Lawrence [name]  insulin glargine (LANTUS) 100 UNIT/ML injection Inject 20 Units into the skin at bedtime.    Yes Provider, Historical, [name]  latanoprost (XALATAN) 0.005 % ophthalmic solution Place 1 drop into both eyes at bedtime.   Yes Provider, Historical, [name]  metoprolol tartrate (LOPRESSOR) 25 MG tablet Take 25 mg by mouth 2 (two) times daily.   Yes Provider, Historical, [name]  Multiple Vitamin (MULTIVITAMIN) capsule Take 1 capsule by mouth daily.   Yes Provider, Historical, [name]  potassium chloride SA (K-DUR,KLOR-CON) 20 MEQ tablet Take 1 tablet (20 mEq total) by mouth daily. 07/03/16  Yes Lawrence [name]  rosuvastatin (CRESTOR) 20 MG tablet Take 1 tablet (20 mg total) by mouth daily at 6 PM. 07/03/16  Yes Lawrence [name]  vitamin C (ASCORBIC ACID) 500 MG tablet Take 500 mg by mouth 2 (two) times daily.    Yes Provider, Historical, [name]  gabapentin (NEURONTIN) 100 MG capsule Take 100 mg by mouth 3 (three) times daily.    Provider, Historical, [name]  Hydrocodone-Acetaminophen 5-300 MG TABS Take 1 mg by mouth daily. 01/28/17   Lawrence [name]  mupirocin ointment (BACTROBAN) 2 % Apply 1 application topically 2 (two) times daily. Patient not taking: Reported on 03/18/2017 01/16/17   Lawrence [name]  sulfamethoxazole-trimethoprim (BACTRIM) 400-80 MG tablet Take 1 tablet by mouth 2  (two) times daily. Patient not taking: Reported on 03/18/2017 01/16/17   Lawrence [name]    Inpatient Medications:  . aspirin EC  81 mg Oral Daily  . cholecalciferol  1,000 Units Oral Daily  . docusate sodium  200 mg Oral BID  . enoxaparin (LOVENOX) injection  1 mg/kg Subcutaneous Q12H  . furosemide  40 mg Oral Daily  . gabapentin  100 mg Oral TID  . HYDROcodone-acetaminophen  1 tablet Oral Daily  . insulin aspart  0-20 Units Subcutaneous Q4H  . insulin glargine  20 Units Subcutaneous QHS  . latanoprost  1 drop Both Eyes QHS  . loratadine  10 mg Oral Daily  . metoprolol tartrate  25 mg Oral BID  . multivitamin with minerals  1 tablet Oral Daily  . mupirocin ointment  1 application Topical BID  . potassium chloride SA  20 mEq Oral Daily  . rosuvastatin  20 mg Oral q1800  . sodium chloride flush  3 mL Intravenous Q12H  . vitamin C  500 mg Oral BID   . sodium chloride      Allergies:  Allergies  Allergen Reactions  . Nitroglycerin  Nausea Only and Other (See Comments)    Patches only - headache  . Statins Other (See Comments)    BODY PAIN   . Lipitor [Atorvastatin] Other (See Comments) and Rash    BODY PAIN  . Lisinopril Other (See Comments)    Light headed, dizzy    Social History   Social History  . Marital status: Married    Spouse name: N/A  . Number of children: N/A  . Years of education: N/A   Occupational History  . Not on file.   Social History Main Topics  . Smoking status: Former Smoker    Packs/day: 1.00    Years: 16.00    Types: Cigarettes    Start date: 11/03/1969    Quit date: 07/22/1986  . Smokeless tobacco: Never Used  . Alcohol use 0.0 oz/week     Comment: OCCASIONALLY  . Drug use: No  . Sexual activity: No   Other Topics Concern  . Not on file   Social History Narrative   Married.  Lives at home with wife.      Family History  Problem Relation Age of Onset  . Hypertension Mother   . Lupus Mother   . Hypertension Father   .  Diabetes Father   . Heart attack Father 51       died in his 65s  . Arthritis Maternal Grandmother   . Arthritis Maternal Grandfather   . Arthritis Paternal Grandmother   . Arthritis Paternal Grandfather      Review of Systems Positive for Chest pain shortness of breath Negative for: General:  chills, fever, night sweats or weight changes.  Cardiovascular: PND orthopnea syncope dizziness  Dermatological skin lesions rashes Respiratory: Cough congestion Urologic: Frequent urination urination at night and hematuria Abdominal: negative for nausea, vomiting, diarrhea, bright red blood per rectum, melena, or hematemesis Neurologic: negative for visual changes, and/or hearing changes  All other systems reviewed and are otherwise negative except as noted above.  Labs:  Recent Labs  03/17/17 1951 03/18/17 0203 03/18/17 0916 03/18/17 1334  TROPONINI <0.03 1.57* 2.19* 1.60*   Lab Results  Component Value Date   WBC 8.8 03/18/2017   HGB 13.1 03/18/2017   HCT 38.2 (L) 03/18/2017   MCV 91.5 03/18/2017   PLT 213 03/18/2017    Recent Labs Lab 03/18/17 0916  NA 140  K 4.3  CL 107  CO2 24  BUN 19  CREATININE 0.93  CALCIUM 8.9  GLUCOSE 103*   Lab Results  Component Value Date   CHOL 143 03/18/2017   HDL 67 03/18/2017   LDLCALC 63 03/18/2017   TRIG 63 03/18/2017   No results found for: DDIMER  Radiology/Studies:  Dg Chest 2 View  Result Date: 03/17/2017 CLINICAL DATA:  Chest pressure and pain starting at 1800 hours EXAM: CHEST  2 VIEW COMPARISON:  07/30/2016 FINDINGS: The heart size and mediastinal contours are within normal limits. Median sternotomy sutures are noted. There is aortic atherosclerosis at the arch. Both lungs are clear. Minimal atelectasis is seen at the lung bases. No effusion or pneumothorax. The visualized skeletal structures are unremarkable. IMPRESSION: No active cardiopulmonary disease. Aortic atherosclerosis. Median sternotomy sutures are noted.  Electronically Signed   By: Ashley Royalty M.D.   On: 03/17/2017 21:09    EKG: Normal sinus rhythm  Weights: Filed Weights   03/17/17 1948 03/18/17 0138  Weight: (!) 138.8 kg (306 lb) (!) 137.8 kg (303 lb 12.8 oz)     Physical Exam: Blood  pressure (!) 107/31, pulse 68, temperature 98.2 F (36.8 C), temperature source Oral, resp. rate 18, height 5' 10.5" (1.791 m), weight (!) 137.8 kg (303 lb 12.8 oz), SpO2 96 %. Body mass index is 42.97 kg/m. General: Well developed, well nourished, in no acute distress. Head eyes ears nose throat: Normocephalic, atraumatic, sclera non-icteric, no xanthomas, nares are without discharge. No apparent thyromegaly and/or mass  Lungs: Normal respiratory effort.  no wheezes, no rales, no rhonchi.  Heart: RRR with normal S1 S2. no murmur gallop, no rub, PMI is normal size and placement, carotid upstroke normal without bruit, jugular venous pressure is normal Abdomen: Soft, non-tender, non-distended with normoactive bowel sounds. No hepatomegaly. No rebound/guarding. No obvious abdominal masses. Abdominal aorta is normal size without bruit Extremities: No edema. no cyanosis, no clubbing, no ulcers  Peripheral : 2+ bilateral upper extremity pulses, 2+ bilateral femoral pulses, 2+ bilateral dorsal pedal pulse Neuro: Alert and oriented. No facial asymmetry. No focal deficit. Moves all extremities spontaneously. Musculoskeletal: Normal muscle tone without kyphosis Psych:  Responds to questions appropriately with a normal affect.    Assessment: 66 year old male with known coronary artery disease status post coronary bypass graft with occluded graft and native right coronary artery recently with good collaterals and small vessel disease with a non-ST elevation myocardial infarction likely due to these issues and not large coronary artery disease requiring further invasive procedures  Plan: 1. Continue heparin for 24 more hours due to acute non-ST elevation myocardial  infarction 2. Continue isosorbide for evidence of myocardial ischemia in the inferior wall from previously occluded right coronary artery and graft to right coronary artery with good collaterals 3. Metoprolol for myocardial infarction and anginal symptoms 4. Furosemide for lower extremity edema 5. High intensity cholesterol therapy with Crestor 6. Cardiac rehabilitation has been discussed and recommended 7. Begin ambulation and follow for any worsening symptoms and potential discharge to home is ambulating well tomorrow for further adjustments of medication management as an outpatient  Signed, Lawrence Skains M.D. [facility] 03/18/2017, 5:37 PM

## 2017-03-18 NOTE — Progress Notes (Signed)
Critical troponin of 2.19 resulted. MD notified.

## 2017-03-19 ENCOUNTER — Observation Stay
Admit: 2017-03-19 | Discharge: 2017-03-19 | Disposition: A | Discharge status: Home / Self Care | Payer: Medicare Other | Attending: Family Medicine | Admitting: Family Medicine

## 2017-03-19 DIAGNOSIS — I214 Non-ST elevation (NSTEMI) myocardial infarction: Secondary | ICD-10-CM | POA: Diagnosis not present

## 2017-03-19 DIAGNOSIS — E785 Hyperlipidemia, unspecified: Secondary | ICD-10-CM | POA: Diagnosis not present

## 2017-03-19 DIAGNOSIS — I10 Essential (primary) hypertension: Secondary | ICD-10-CM | POA: Diagnosis not present

## 2017-03-19 DIAGNOSIS — I25798 Atherosclerosis of other coronary artery bypass graft(s) with other forms of angina pectoris: Secondary | ICD-10-CM | POA: Diagnosis not present

## 2017-03-19 DIAGNOSIS — R0789 Other chest pain: Secondary | ICD-10-CM | POA: Diagnosis not present

## 2017-03-19 DIAGNOSIS — I2510 Atherosclerotic heart disease of native coronary artery without angina pectoris: Secondary | ICD-10-CM | POA: Diagnosis not present

## 2017-03-19 LAB — HEMOGLOBIN A1C
HEMOGLOBIN A1C: 7.9 % — AB (ref 4.8–5.6)
Mean Plasma Glucose: 180 mg/dL

## 2017-03-19 LAB — GLUCOSE, CAPILLARY
GLUCOSE-CAPILLARY: 101 mg/dL — AB (ref 65–99)
Glucose-Capillary: 272 mg/dL — ABNORMAL HIGH (ref 65–99)
Glucose-Capillary: 104 mg/dL — ABNORMAL HIGH (ref 65–99)
Glucose-Capillary: 209 mg/dL — ABNORMAL HIGH (ref 65–99)
Glucose-Capillary: 210 mg/dL — ABNORMAL HIGH (ref 65–99)

## 2017-03-19 MED ORDER — INSULIN ASPART 100 UNIT/ML ~~LOC~~ SOLN
0.0000 [IU] | Freq: Three times a day (TID) | SUBCUTANEOUS | Status: DC
  Administered 2017-03-19: 7 [IU] via SUBCUTANEOUS
  Filled 2017-03-19: qty 7

## 2017-03-19 MED ORDER — INSULIN ASPART 100 UNIT/ML ~~LOC~~ SOLN: 0.0000 [IU] | Freq: Every day | SUBCUTANEOUS | Status: DC

## 2017-03-19 MED ORDER — INSULIN ASPART 100 UNIT/ML ~~LOC~~ SOLN
15.0000 [IU] | Freq: Three times a day (TID) | SUBCUTANEOUS | Status: DC
  Administered 2017-03-19: 15 [IU] via SUBCUTANEOUS
  Filled 2017-03-19: qty 15

## 2017-03-19 MED ORDER — CLOPIDOGREL BISULFATE 75 MG PO TABS
75.0000 mg | ORAL_TABLET | Freq: Every day | ORAL | Status: DC
  Administered 2017-03-19: 75 mg via ORAL
  Filled 2017-03-19: qty 1

## 2017-03-19 MED ORDER — INSULIN GLARGINE 100 UNIT/ML ~~LOC~~ SOLN
32.0000 [IU] | Freq: Every day | SUBCUTANEOUS | Status: DC
  Filled 2017-03-19: qty 0.32

## 2017-03-19 MED ORDER — CLOPIDOGREL BISULFATE 75 MG PO TABS: 75.0000 mg | ORAL_TABLET | Freq: Every day | ORAL | 0 refills | Status: AC

## 2017-03-19 NOTE — Progress Notes (Signed)
[facility] Encounter Note  Patient: Lawrence [name] / Admit Date: 03/17/2017 / Date of Encounter: 03/19/2017, 8:22 AM   Subjective: Patient is feeling much better today. Ambulating last night without evidence of significant chest discomfort. Patient is hemodynamically stable on appropriate previous medication management. Troponin peaked over to a consistent with a non-ST elevation myocardial infarction. Catheterization last month shows occluded right coronary and graft to right coronary artery with collateral flow and small vessel disease likely causing current non-ST elevation myocardial infarction  Review of Systems: Positive for: Shortness of breath Negative for: Vision change, hearing change, syncope, dizziness, nausea, vomiting,diarrhea, bloody stool, stomach pain, cough, congestion, diaphoresis, urinary frequency, urinary pain,skin lesions, skin rashes Others previously listed  Objective: Telemetry: Normal sinus rhythm Physical Exam: Blood pressure (!) 132/45, pulse 74, temperature 97.7 F (36.5 C), temperature source Oral, resp. rate 18, height 5' 10.5" (1.791 m), weight (!) 137.8 kg (303 lb 12.8 oz), SpO2 96 %. Body mass index is 42.97 kg/m. General: Well developed, well nourished, in no acute distress. Head: Normocephalic, atraumatic, sclera non-icteric, no xanthomas, nares are without discharge. Neck: No apparent masses Lungs: Normal respirations with no wheezes, no rhonchi, no rales , no crackles   Heart: Regular rate and rhythm, normal S1 S2, no murmur, no rub, no gallop, PMI is normal size and placement, carotid upstroke normal without bruit, jugular venous pressure normal Abdomen: Soft, non-tender, non-distended with normoactive bowel sounds. No hepatosplenomegaly. Abdominal aorta is normal size without bruit Extremities: Trace to 1+ edema, no clubbing, no cyanosis, no ulcers,  Peripheral: 2+ radial, 2+ femoral, 2+ dorsal pedal pulses Neuro: Alert and  oriented. Moves all extremities spontaneously. Psych:  Responds to questions appropriately with a normal affect.   Intake/Output Summary (Last 24 hours) at 03/19/17 9628 Last data filed at 03/19/17 0533  Gross per 24 hour  Intake              480 ml  Output              725 ml  Net             -245 ml    Inpatient Medications:  . aspirin EC  81 mg Oral Daily  . cholecalciferol  1,000 Units Oral Daily  . clopidogrel  75 mg Oral Daily  . docusate sodium  200 mg Oral BID  . enoxaparin (LOVENOX) injection  1 mg/kg Subcutaneous Q12H  . furosemide  40 mg Oral Daily  . gabapentin  100 mg Oral TID  . HYDROcodone-acetaminophen  1 tablet Oral Daily  . insulin aspart  0-20 Units Subcutaneous Q4H  . insulin aspart  7 Units Subcutaneous TID WC  . insulin glargine  30 Units Subcutaneous QHS  . latanoprost  1 drop Both Eyes QHS  . loratadine  10 mg Oral Daily  . metoprolol tartrate  25 mg Oral BID  . multivitamin with minerals  1 tablet Oral Daily  . mupirocin ointment  1 application Topical BID  . potassium chloride SA  20 mEq Oral Daily  . rosuvastatin  20 mg Oral q1800  . sodium chloride flush  3 mL Intravenous Q12H  . vitamin C  500 mg Oral BID   Infusions:  . sodium chloride      Labs:  Recent Labs  03/17/17 1951 03/18/17 0203 03/18/17 0916  NA 136  --  140  K 3.7  --  4.3  CL 99*  --  107  CO2 28  --  24  GLUCOSE 345*  --  103*  BUN 24*  --  19  CREATININE 1.17 1.07 0.93  CALCIUM 8.8*  --  8.9   No results for input(s): AST, ALT, ALKPHOS, BILITOT, PROT, ALBUMIN in the last 72 hours.  Recent Labs  03/18/17 0203 03/18/17 0916  WBC 8.3 8.8  HGB 12.2* 13.1  HCT 35.1* 38.2*  MCV 92.3 91.5  PLT 197 213    Recent Labs  03/17/17 1951 03/18/17 0203 03/18/17 0916 03/18/17 1334  TROPONINI <0.03 1.57* 2.19* 1.60*   Invalid input(s): POCBNP  Recent Labs  03/18/17 0203  HGBA1C 7.9*     Weights: Filed Weights   03/17/17 1948 03/18/17 0138  Weight: (!)  138.8 kg (306 lb) (!) 137.8 kg (303 lb 12.8 oz)     Radiology/Studies:  Dg Chest 2 View  Result Date: 03/17/2017 CLINICAL DATA:  Chest pressure and pain starting at 1800 hours EXAM: CHEST  2 VIEW COMPARISON:  07/30/2016 FINDINGS: The heart size and mediastinal contours are within normal limits. Median sternotomy sutures are noted. There is aortic atherosclerosis at the arch. Both lungs are clear. Minimal atelectasis is seen at the lung bases. No effusion or pneumothorax. The visualized skeletal structures are unremarkable. IMPRESSION: No active cardiopulmonary disease. Aortic atherosclerosis. Median sternotomy sutures are noted. Electronically Signed   By: Ashley Royalty M.D.   On: 03/17/2017 21:09     Assessment and Recommendation  66 y.o. male with known coronary artery disease essential hypertension makes hyperlipidemia diabetes with complications on appropriate medication management with the recent occlusion of right coronary artery and graft to right coronary artery with good collaterals and not amenable to PCI and stent placement and other small vessel disease with new onset of non-ST elevation myocardial infarction likely not due to large vessel disease now improving 1. Continue dual antiplatelet therapy for acute myocardial infarction with Plavix and aspirin 2. Okay for discontinuation of heparin 3. Ambulation and follow for any further worsening symptoms requiring further intervention but if patient ambulating well with no further significant chest pain okay for discharge home with outpatient rehabilitation 4. Continue high intensity cholesterol therapy with Crestor 5. Continue beta blocker and would consider low-dose ACE inhibitor for myocardial infarction 6. Furosemide for lower extremity edema 7. Further cardiac diagnostics necessary at this time  Signed, Serafina Royals M.D. FACC

## 2017-03-19 NOTE — Discharge Instructions (Signed)
Heart Attack A heart attack (myocardial infarction, MI) causes damage to the heart that cannot be fixed. A heart attack often happens when a blood clot or other blockage cuts blood flow to the heart. When this happens, certain areas of the heart begin to die. This causes the pain you feel during a heart attack. Follow these instructions at home:  Take medicine as told by your doctor. You may need medicine to: ? Keep your blood from clotting too easily. ? Control your blood pressure. ? Lower your cholesterol. ? Control abnormal heart rhythms.  Change certain behaviors as told by your doctor. This may include: ? Quitting smoking. ? Being active. ? Eating a heart-healthy diet. Ask your doctor for help with this diet. ? Keeping a healthy weight. ? Keeping your diabetes under control. ? Lessening stress. ? Limiting how much alcohol you drink. Do not take these medicines unless your doctor says that you can:  Nonsteroidal anti-inflammatory drugs (NSAIDs). These include: ? Ibuprofen. ? Naproxen. ? Celecoxib.  Vitamin supplements that have vitamin A, vitamin E, or both.  Hormone therapy that contains estrogen with or without progestin.  Get help right away if:  You have sudden chest discomfort.  You have sudden discomfort in your: ? Arms. ? Back. ? Neck. ? Jaw.  You have shortness of breath at any time.  You have sudden sweating or clammy skin.  You feel sick to your stomach (nauseous) or throw up (vomit).  You suddenly get light-headed or dizzy.  You feel your heart beating fast or skipping beats. These symptoms may be an emergency. Do not wait to see if the symptoms will go away. Get medical help right away. Call your local emergency services (911 in the [place] Do not drive yourself to the hospital. This information is not intended to replace advice given to you by your health care provider. Make sure you discuss any questions you have with your health care  provider. Document Released: 04/20/2012 Document Revised: 03/27/2016 Document Reviewed: 12/23/2013 Elsevier Interactive Patient Education  2017 Elsevier Inc.  

## 2017-03-19 NOTE — Progress Notes (Signed)
*  PRELIMINARY RESULTS* Echocardiogram 2D Echocardiogram has been performed.  Sherrie Sport 03/19/2017, 11:58 AM

## 2017-03-19 NOTE — Progress Notes (Signed)
IV and tele removed from patient. Discharge instructions given to patient. Patient verbalized understanding. No distress at this time. Wife is at bedside and will be transporting patient home.

## 2017-03-19 NOTE — Progress Notes (Signed)
Inpatient Diabetes Program Recommendations  AACE/ADA: New Consensus Statement on Inpatient Glycemic Control (2015)  Target Ranges:  Prepandial:   less than 140 mg/dL      Peak postprandial:   less than 180 mg/dL (1-2 hours)      Critically ill patients:  140 - 180 mg/dL   Lab Results  Component Value Date   GLUCAP 209 (H) 03/19/2017   HGBA1C 7.9 (H) 03/18/2017    Review of Glycemic Control  Results for Lawrence [name], Lawrence [name] (MRN [id number]) as of 03/19/2017 10:06  Ref. Range 03/18/2017 16:29 03/18/2017 21:30 03/19/2017 00:12 03/19/2017 04:57 03/19/2017 07:56  Glucose-Capillary Latest Ref Range: 65 - 99 mg/dL 330 (H) 209 (H) 101 (H) 104 (H) 209 (H)    Diabetes history: Type 2  Outpatient Diabetes medications: Novolog 20 units tid, Lantus 20 units qhs  Current orders for Inpatient glycemic control: Lantus 30 units qhs, Novolog 7 units tid, Novolog 0-20 units q4h   Inpatient Diabetes Program Recommendations:  Spoke to patient by phone- confirms medications as above.  Reports he ate nothing between 4am and 8am.  He reports blood sugars are consistently above 200mg /dl when he checks sugars between midnight and 6am. Suggested he discuss alternative long acting insulin options with MD at the [facility] to see if he can get better night time control- for now,   Consider increasing Lantus to 32 units qhs (fasting blood sugar 209mg /dl)  Stop Novolog 0-20 units q4h   Add Novolog 0-20 units tid, Novolog 0-5 units qhs  Increase mealtime Novolog from 7 units tid to Novolog 15 units tid (hold if patient eats less than 50%)  Text page to Dr. Manuella Ghazi.   Gentry Fitz, RN, BA, MHA, CDE Diabetes Coordinator Inpatient Diabetes Program  [phone] (Team Pager) [phone] ([place]) 03/19/2017 10:23 AM

## 2017-03-20 ENCOUNTER — Telehealth: Payer: Self-pay | Admitting: *Deleted

## 2017-03-20 LAB — ECHOCARDIOGRAM COMPLETE
Height: 70.5 in
Weight: 4860.8 oz

## 2017-03-20 NOTE — Telephone Encounter (Signed)
Transition Care Management Follow-up Telephone Call   Date discharged? 03/19/2017   How have you been since you were released from the hospital? "feeling fine"   Do you understand why you were in the hospital? yes   Do you understand the discharge instructions? yes   Where were you discharged to? Home    Items Reviewed:  Medications reviewed: yes  Allergies reviewed: yes  Dietary changes reviewed: na  Referrals reviewed: na   Functional Questionnaire:  Activities of Daily Living (ADLs):   He states they are independent in the following in all areas   Any transportation issues/concerns?: no   Any patient concerns? no   Confirmed importance and date/time of follow-up visits scheduled yes  Provider Appointment booked with 5/24 @ 1200 with [facility]  Confirmed with patient if condition begins to worsen call PCP or go to the ER.  Patient was given the office number and encouraged to call back with question or concerns.  : yes

## 2017-03-21 NOTE — Discharge Summary (Signed)
[facility] NAME: Lawrence [name]    MR#:  [id number]  DATE OF BIRTH:  June 09, 1951  DATE OF ADMISSION:  03/17/2017   ADMITTING PHYSICIAN: Harvie Bridge, DO  DATE OF DISCHARGE: 03/19/2017  4:58 PM  PRIMARY CARE PHYSICIAN: Pleas Koch, NP   ADMISSION DIAGNOSIS:  chest pressure DISCHARGE DIAGNOSIS:  Active Problems:   Chest pain, rule out acute myocardial infarction  SECONDARY DIAGNOSIS:   Past Medical History:  Diagnosis Date  . CAD (coronary artery disease)    07/2007  . Diabetes ([place])   . Elevated lipids   . Glaucoma   . HBP (high blood pressure)   . Neuropathy   . Osteoarthritis   . Skin cancer   . Sleep apnea    CPAP   HOSPITAL COURSE:  66 y.o. male with known coronary artery disease essential hypertension makes hyperlipidemia diabetes with complications on appropriate medication management with the recent occlusion of right coronary artery and graft to right coronary artery with good collaterals and not amenable to PCI and stent placement and other small vessel disease with new onset of non-ST elevation myocardial infarction likely not due to large vessel disease now improving  * NSTEMI - treated medically with heparin and on D/C dual antiplatelet therapy - echo shows normal LV function Aspirin prescribed at discharge:  Yes High Intensity Statin Prescribed? (Lipitor 40-80mg  or Crestor 20-40mg ): Yes Beta Blocker Prescribed: Yes ADP Receptor Inhibitor Prescribed? (i.e. Plavix etc.-Includes Medically Managed Patients): Yes Was EF assessed during THIS hospitalization? Yes and is > 40% Was Cardiac Rehab II ordered? (Included Medically managed Patients): Yes   Patient is feeling much better today. Ambulating last night without evidence of significant chest discomfort. Patient is hemodynamically stable on appropriate previous medication management. Troponin peaked over to a consistent with a non-ST elevation  myocardial infarction. Catheterization last month shows occluded right coronary and graft to right coronary artery with collateral flow and small vessel disease likely causing current non-ST elevation myocardial infarction. DISCHARGE CONDITIONS:  stable CONSULTS OBTAINED:  Treatment Team:  Corey Skains, MD DRUG ALLERGIES:   Allergies  Allergen Reactions  . Nitroglycerin Nausea Only and Other (See Comments)    Patches only - headache  . Statins Other (See Comments)    BODY PAIN   . Lipitor [Atorvastatin] Other (See Comments) and Rash    BODY PAIN  . Lisinopril Other (See Comments)    Light headed, dizzy   DISCHARGE MEDICATIONS:   Allergies as of 03/19/2017      Reactions   Nitroglycerin Nausea Only, Other (See Comments)   Patches only - headache   Statins Other (See Comments)   BODY PAIN   Lipitor [atorvastatin] Other (See Comments), Rash   BODY PAIN   Lisinopril Other (See Comments)   Light headed, dizzy      Medication List    STOP taking these medications   sulfamethoxazole-trimethoprim 400-80 MG tablet Commonly known as:  BACTRIM     TAKE these medications   aspirin 81 MG EC tablet Take 81 mg by mouth daily. Swallow whole.   cholecalciferol 1000 units tablet Commonly known as:  VITAMIN D Take 1,000 Units by mouth daily.   clopidogrel 75 MG tablet Commonly known as:  PLAVIX Take 1 tablet (75 mg total) by mouth daily.   docusate sodium 100 MG capsule Commonly known as:  COLACE Take 200 mg by mouth 2 (two) times daily.   fexofenadine 180 MG tablet  Commonly known as:  ALLEGRA Take 180 mg by mouth daily.   furosemide 40 MG tablet Commonly known as:  LASIX Take 1 tablet (40 mg total) by mouth daily.   gabapentin 100 MG capsule Commonly known as:  NEURONTIN Take 100 mg by mouth 3 (three) times daily.   GLUCAGON EMERGENCY 1 MG injection Generic drug:  glucagon Inject 1 mg into the vein once as needed. Reported on 02/11/2016     Hydrocodone-Acetaminophen 5-300 MG Tabs Take 1 mg by mouth daily.   insulin aspart 100 UNIT/ML injection Commonly known as:  NOVOLOG Inject 20 Units into the skin 3 (three) times daily. What changed:  when to take this   LANTUS 100 UNIT/ML injection Generic drug:  insulin glargine Inject 20 Units into the skin at bedtime.   latanoprost 0.005 % ophthalmic solution Commonly known as:  XALATAN Place 1 drop into both eyes at bedtime.   metoprolol tartrate 25 MG tablet Commonly known as:  LOPRESSOR Take 25 mg by mouth 2 (two) times daily.   multivitamin capsule Take 1 capsule by mouth daily.   mupirocin ointment 2 % Commonly known as:  BACTROBAN Apply 1 application topically 2 (two) times daily.   potassium chloride SA 20 MEQ tablet Commonly known as:  K-DUR,KLOR-CON Take 1 tablet (20 mEq total) by mouth daily.   rosuvastatin 20 MG tablet Commonly known as:  CRESTOR Take 1 tablet (20 mg total) by mouth daily at 6 PM.   vitamin C 500 MG tablet Commonly known as:  ASCORBIC ACID Take 500 mg by mouth 2 (two) times daily.      DISCHARGE INSTRUCTIONS:   DIET:  Cardiac diet DISCHARGE CONDITION:  Stable ACTIVITY:  Activity as tolerated OXYGEN:  Home Oxygen: No.  Oxygen Delivery: room air DISCHARGE LOCATION:  home   If you experience worsening of your admission symptoms, develop shortness of breath, life threatening emergency, suicidal or homicidal thoughts you must seek medical attention immediately by calling 911 or calling your MD immediately  if symptoms less severe.  You Must read complete instructions/literature along with all the possible adverse reactions/side effects for all the Medicines you take and that have been prescribed to you. Take any new Medicines after you have completely understood and accpet all the possible adverse reactions/side effects.   Please note  You were cared for by a hospitalist during your hospital stay. If you have any questions  about your discharge medications or the care you received while you were in the hospital after you are discharged, you can call the unit and asked to speak with the hospitalist on call if the hospitalist that took care of you is not available. Once you are discharged, your primary care physician will handle any further medical issues. Please note that NO REFILLS for any discharge medications will be authorized once you are discharged, as it is imperative that you return to your primary care physician (or establish a relationship with a primary care physician if you do not have one) for your aftercare needs so that they can reassess your need for medications and monitor your lab values.    On the day of Discharge:  VITAL SIGNS:  Blood pressure (!) 130/48, pulse 68, temperature 98.6 F (37 C), temperature source Oral, resp. rate 16, height 5' 10.5" (1.791 m), weight (!) 137.8 kg (303 lb 12.8 oz), SpO2 97 %. PHYSICAL EXAMINATION:  GENERAL:  66 y.o.-year-old patient lying in the bed with no acute distress.  EYES: Pupils equal, round,  reactive to light and accommodation. No scleral icterus. Extraocular muscles intact.  HEENT: Head atraumatic, normocephalic. Oropharynx and nasopharynx clear.  NECK:  Supple, no jugular venous distention. No thyroid enlargement, no tenderness.  LUNGS: Normal breath sounds bilaterally, no wheezing, rales,rhonchi or crepitation. No use of accessory muscles of respiration.  CARDIOVASCULAR: S1, S2 normal. No murmurs, rubs, or gallops.  ABDOMEN: Soft, non-tender, non-distended. Bowel sounds present. No organomegaly or mass.  EXTREMITIES: No pedal edema, cyanosis, or clubbing.  NEUROLOGIC: Cranial nerves II through XII are intact. Muscle strength 5/5 in all extremities. Sensation intact. Gait not checked.  PSYCHIATRIC: The patient is alert and oriented x 3.  SKIN: No obvious rash, lesion, or ulcer.  DATA REVIEW:   CBC  Recent Labs Lab 03/18/17 0916  WBC 8.8  HGB 13.1   HCT 38.2*  PLT 213    Chemistries   Recent Labs Lab 03/18/17 0916  NA 140  K 4.3  CL 107  CO2 24  GLUCOSE 103*  BUN 19  CREATININE 0.93  CALCIUM 8.9    Follow-up Information    Pleas Koch, NP. Schedule an appointment as soon as possible for a visit in 1 week(s).   Specialty:  Internal Medicine Contact information: [address] [place] [address] [phone]        Corey Skains, MD. Schedule an appointment as soon as possible for a visit in 2 week(s).   Specialty:  Cardiology Contact information: [address] [facility] [place] [address] [phone]           Management plans discussed with the patient, family and they are in agreement.  CODE STATUS: Prior   TOTAL TIME TAKING CARE OF THIS PATIENT: 45 minutes.    Max Sane M.D on 03/21/2017 at 12:17 PM  Between 7am to 6pm - Pager - [phone]  After 6pm go to [email or web address] - Proofreader  [facility]  Office  [phone]  CC: Primary care physician; Pleas Koch, NP   Note: This dictation was prepared with Dragon dictation along with smaller phrase technology. Any transcriptional errors that result from this process are unintentional.

## 2017-03-26 ENCOUNTER — Ambulatory Visit (INDEPENDENT_AMBULATORY_CARE_PROVIDER_SITE_OTHER): Payer: Medicare Other | Admitting: Primary Care

## 2017-03-26 ENCOUNTER — Encounter: Payer: Self-pay | Admitting: Primary Care

## 2017-03-26 VITALS — BP 122/60 | HR 78 | Temp 97.8°F | Ht 71.0 in | Wt 305.0 lb

## 2017-03-26 DIAGNOSIS — I214 Non-ST elevation (NSTEMI) myocardial infarction: Secondary | ICD-10-CM | POA: Diagnosis not present

## 2017-03-26 DIAGNOSIS — Z794 Long term (current) use of insulin: Secondary | ICD-10-CM | POA: Diagnosis not present

## 2017-03-26 DIAGNOSIS — E109 Type 1 diabetes mellitus without complications: Secondary | ICD-10-CM | POA: Diagnosis not present

## 2017-03-26 DIAGNOSIS — E118 Type 2 diabetes mellitus with unspecified complications: Secondary | ICD-10-CM | POA: Diagnosis not present

## 2017-03-26 DIAGNOSIS — Z6841 Body Mass Index (BMI) 40.0 and over, adult: Secondary | ICD-10-CM

## 2017-03-26 DIAGNOSIS — I200 Unstable angina: Secondary | ICD-10-CM | POA: Diagnosis not present

## 2017-03-26 MED ORDER — NITROGLYCERIN 0.4 MG SL SUBL: 0.4000 mg | SUBLINGUAL_TABLET | SUBLINGUAL | 1 refills | Status: DC | PRN

## 2017-03-26 MED ORDER — INSULIN GLARGINE 100 UNIT/ML ~~LOC~~ SOLN: 24.0000 [IU] | Freq: Every day | SUBCUTANEOUS | 5 refills | Status: DC

## 2017-03-26 NOTE — Progress Notes (Signed)
Subjective:    Patient ID: Lawrence [name], male    DOB: 10-18-1951, 66 y.o.   MRN: [id number]  HPI  [name] is 66 year old male with a history of CAD, MI, Unstable angina, CABG, Type 2 Diabetes, Hyperlipidemia, Obesity who presents today for [facility] Follow Up. He underwent cardiac catheterization in March 2018 with severe three vessle CAD with occluded RCA and stenosis of left main branch. RCA graft is occluded which is not amendable.   He presented to [facility] ED on 03/17/17 with a chief complaint of chest pain. He also reported dizziness. His pain improved slightly with nitroglycerin that he took prior to arrival. During his stay in the ED he underwent chest xray (negative), ECG (without ischemia or infart), Troponin (negative x 1). He was provided with additional nitroglycerin with resolve of pain. Given his history and symptoms he was admitted for further evaluation.  During his hospital stay he was noted to have NSTEMI given steady rise of Troponin level. He underwent treatment with heparin, current medications. He underwent echocardiogram with normal LV function. He was also treated with dual antiplatelet therapy, added in Plavix to his regimen. He was discharged home on 03/19/17 with recommendations for cardiology and PCP follow up, and cardiac rehabilitation.  Since his discharge home he's doing fair. He's had some chest pressure but has not taken any nitroglycerin. His nitroglycerin bottles are expired. His chest pressure, since he's been home, occurs mostly after eating, sometimes not. He has taken Alka-Selzer once since he's been home with improvement in pain. He has an appointment with his cardiologist on June 5th. He will start cardiac rehabilitation on May 30th.  2) Type 2 Diabetes: A1C checked on 03/18/17 with result of 7.9. He is currently managed on Lantus 20 units HS and Novolog 20 units TID. He is due to see his endocrinologist on June 4th through the [place].  He's checking his  sugars 4-5 times daily.  His AM fasting sugars are 120-190, some mid 200's. Afternoon: low 100's-mid 200's. Evening: low 100's-mid 200's Bedtime: low 100's-mid 200's.   Diet currently consists of:  Breakfast: Sugary cereal, fruit, oatmeal (1 tbsp sugar) Lunch: Restaurants ([place], etc), lean cuisine, sandwich, chips Dinner: Meat, vegetable, starch, restaurants ([place], etc) Snacks: Crackers, chips, Fiber One bars, cream cheese/cool whip Desserts: Daily, several times daily. Jello, Fiber One Bar Beverages: Un-sweet tea, coffee, little water consumption  Exercise: Will be participating in cardiac rehabilitation   Review of Systems  Constitutional: Negative for fever.  Respiratory: Negative for cough.        Shortness of breath with moderate exertion  Cardiovascular:       Intermittent chest pressure  Gastrointestinal: Negative for nausea and vomiting.       Esophageal reflux  Allergic/Immunologic: Positive for environmental allergies.       Past Medical History:  Diagnosis Date  . CAD (coronary artery disease)    07/2007  . Diabetes ([place])   . Elevated lipids   . Glaucoma   . HBP (high blood pressure)   . Neuropathy   . Osteoarthritis   . Skin cancer   . Sleep apnea    CPAP     Social History   Social History  . Marital status: Married    Spouse name: N/A  . Number of children: N/A  . Years of education: N/A   Occupational History  . Not on file.   Social History Main Topics  . Smoking status: Former Smoker  Packs/day: 1.00    Years: 16.00    Types: Cigarettes    Start date: 11/03/1969    Quit date: 07/22/1986  . Smokeless tobacco: Never Used  . Alcohol use 0.0 oz/week     Comment: OCCASIONALLY  . Drug use: No  . Sexual activity: No   Other Topics Concern  . Not on file   Social History Narrative   Married.  Lives at home with wife.     Past Surgical History:  Procedure Laterality Date  . APPENDECTOMY    . CARDIAC CATHETERIZATION N/A  06/11/2016   Procedure: Left Heart Cath and Coronary Angiography;  Surgeon: Corey Skains, MD;  Location: [facility];  Service: Cardiovascular;  Laterality: N/A;  . CARPAL TUNNEL RELEASE Left 04/24/2016   Procedure: CARPAL TUNNEL RELEASE;  Surgeon: Hessie Knows, MD;  Location: [facility];  Service: Orthopedics;  Laterality: Left;  . CATARACT EXTRACTION W/ INTRAOCULAR LENS  IMPLANT, BILATERAL Bilateral   . CATARACT EXTRACTION, BILATERAL    . CORONARY ARTERY BYPASS GRAFT N/A 06/27/2016   Procedure: CORONARY ARTERY BYPASS GRAFTING times four using left internal mammary artery and right leg saphenous vein;  Surgeon: Ivin Poot, MD;  Location: [facility];  Service: Open Heart Surgery;  Laterality: N/A;  . EYE SURGERY Bilateral   . JOINT REPLACEMENT    . KNEE ARTHROPLASTY Left 02/11/2016   Procedure: COMPUTER ASSISTED TOTAL KNEE ARTHROPLASTY;  Surgeon: Dereck Leep, MD;  Location: [facility];  Service: Orthopedics;  Laterality: Left;  . KNEE ARTHROSCOPY Right   . LEFT HEART CATH AND CORS/GRAFTS ANGIOGRAPHY Left 01/13/2017   Procedure: Left Heart Cath and Cors/Grafts Angiography;  Surgeon: Corey Skains, MD;  Location: [facility];  Service: Cardiovascular;  Laterality: Left;  . TEE WITHOUT CARDIOVERSION N/A 06/27/2016   Procedure: TRANSESOPHAGEAL ECHOCARDIOGRAM (TEE);  Surgeon: Ivin Poot, MD;  Location: [facility];  Service: Open Heart Surgery;  Laterality: N/A;    Family History  Problem Relation Age of Onset  . Hypertension Mother   . Lupus Mother   . Hypertension Father   . Diabetes Father   . Heart attack Father 86       died in his 61s  . Arthritis Maternal Grandmother   . Arthritis Maternal Grandfather   . Arthritis Paternal Grandmother   . Arthritis Paternal Grandfather     Allergies  Allergen Reactions  . Nitroglycerin Nausea Only and Other (See Comments)    Patches only - headache  . Statins Other (See Comments)    BODY PAIN   . Lipitor [Atorvastatin] Other  (See Comments) and Rash    BODY PAIN  . Lisinopril Other (See Comments)    Light headed, dizzy    Current Outpatient Prescriptions on File Prior to Visit  Medication Sig Dispense Refill  . aspirin 81 MG EC tablet Take 81 mg by mouth daily. Swallow whole.    . cholecalciferol (VITAMIN D) 1000 units tablet Take 1,000 Units by mouth daily.    . clopidogrel (PLAVIX) 75 MG tablet Take 1 tablet (75 mg total) by mouth daily. 30 tablet 0  . docusate sodium (COLACE) 100 MG capsule Take 200 mg by mouth 2 (two) times daily.    . fexofenadine (ALLEGRA) 180 MG tablet Take 180 mg by mouth daily.    . furosemide (LASIX) 40 MG tablet Take 1 tablet (40 mg total) by mouth daily. 30 tablet 1  . glucagon (GLUCAGON EMERGENCY) 1 MG injection Inject 1 mg into  the vein once as needed. Reported on 02/11/2016    . insulin aspart (NOVOLOG) 100 UNIT/ML injection Inject 20 Units into the skin 3 (three) times daily. (Patient taking differently: Inject 20 Units into the skin 3 (three) times daily with meals. ) 10 mL 5  . latanoprost (XALATAN) 0.005 % ophthalmic solution Place 1 drop into both eyes at bedtime.    . metoprolol tartrate (LOPRESSOR) 25 MG tablet Take 25 mg by mouth 2 (two) times daily.    . Multiple Vitamin (MULTIVITAMIN) capsule Take 1 capsule by mouth daily.    . potassium chloride SA (K-DUR,KLOR-CON) 20 MEQ tablet Take 1 tablet (20 mEq total) by mouth daily. 30 tablet 1  . rosuvastatin (CRESTOR) 20 MG tablet Take 1 tablet (20 mg total) by mouth daily at 6 PM. 30 tablet 1  . vitamin C (ASCORBIC ACID) 500 MG tablet Take 500 mg by mouth 2 (two) times daily.      No current facility-administered medications on file prior to visit.     BP 122/60   Pulse 78   Temp 97.8 F (36.6 C) (Oral)   Ht 5\' 11"  (1.803 m)   Wt (!) 305 lb (138.3 kg)   SpO2 96%   BMI 42.54 kg/m    Objective:   Physical Exam  Constitutional: He appears well-nourished.  Neck: Neck supple.  Cardiovascular: Normal rate and regular  rhythm.   Pulmonary/Chest: Effort normal and breath sounds normal.  Skin: Skin is warm and dry.  Psychiatric: He has a normal mood and affect.          Assessment & Plan:  Hospital follow-up:  Admitted on Mar 17 2017 for a NSTEMI. Hospital stay overall unremarkable. Plavix was added to his regimen for dual antiplatelet treatment. A1c checked during hospitalization, above goal. Lantus adjusted today. He will see his cardiologist in early June. He will start cardiac rehabilitation in late May. Refill provided for nitroglycerin given his expired bottles. Discussed to try Pepcid for several days if he experiences esophageal reflux symptoms.  All Hospital notes, labs, imaging reviewed. Sheral Flow, NP

## 2017-03-26 NOTE — Assessment & Plan Note (Signed)
Discussed the importance of a healthy diet and regular exercise in order for weight loss, and to reduce the risk of other medical diseases. Dissected his diet, recommendations made.

## 2017-03-26 NOTE — Assessment & Plan Note (Signed)
Recent A1c without improvement when compared to prior A1c reading. Current A1c of 7.9 which is too high given comorbidities. Will increase Lantus to 24 units, continue NovoLog 20 units 3 times a day. He will see his endocrinologist in a few weeks.

## 2017-03-26 NOTE — Patient Instructions (Addendum)
Try taking Pepcid for several days if you experience acid reflux symptoms.  You may take nitroglycerin tablets every 5 minutes up to three doses as needed for chest pain. I sent a refill to [facility].  Increase your Lantus to 24 units at bedtime. Talk this over with your endocrinologist during your upcoming visit. I sent refills through [facility].  Follow up in 6 months for re-evaluation.  It was a pleasure to see you today!

## 2017-04-01 ENCOUNTER — Encounter: Payer: Self-pay | Admitting: *Deleted

## 2017-04-01 ENCOUNTER — Encounter: Payer: Medicare Other | Attending: Cardiovascular Disease | Admitting: *Deleted

## 2017-04-01 VITALS — Ht 70.8 in | Wt 300.1 lb

## 2017-04-01 DIAGNOSIS — E119 Type 2 diabetes mellitus without complications: Secondary | ICD-10-CM | POA: Diagnosis not present

## 2017-04-01 DIAGNOSIS — Z7902 Long term (current) use of antithrombotics/antiplatelets: Secondary | ICD-10-CM | POA: Diagnosis not present

## 2017-04-01 DIAGNOSIS — Z79899 Other long term (current) drug therapy: Secondary | ICD-10-CM | POA: Diagnosis not present

## 2017-04-01 DIAGNOSIS — I214 Non-ST elevation (NSTEMI) myocardial infarction: Secondary | ICD-10-CM | POA: Diagnosis not present

## 2017-04-01 DIAGNOSIS — Z7982 Long term (current) use of aspirin: Secondary | ICD-10-CM | POA: Diagnosis not present

## 2017-04-01 DIAGNOSIS — Z794 Long term (current) use of insulin: Secondary | ICD-10-CM | POA: Diagnosis not present

## 2017-04-01 DIAGNOSIS — Z87891 Personal history of nicotine dependence: Secondary | ICD-10-CM | POA: Insufficient documentation

## 2017-04-01 DIAGNOSIS — I2510 Atherosclerotic heart disease of native coronary artery without angina pectoris: Secondary | ICD-10-CM | POA: Insufficient documentation

## 2017-04-01 LAB — GLUCOSE, CAPILLARY
GLUCOSE-CAPILLARY: 41 mg/dL — AB (ref 65–99)
GLUCOSE-CAPILLARY: 76 mg/dL (ref 65–99)
Glucose-Capillary: 102 mg/dL — ABNORMAL HIGH (ref 65–99)

## 2017-04-01 NOTE — Progress Notes (Signed)
Cardiac Individual Treatment Plan  Patient Details  Name: Lawrence [name] MRN: [id number] Date of Birth: 1951-05-09 Referring Provider:     Cardiac Rehab from 04/01/2017 in [facility] Cardiac and Pulmonary Rehab  Referring Provider  Josephina Gip MD      Initial Encounter Date:    Cardiac Rehab from 04/01/2017 in [facility] Cardiac and Pulmonary Rehab  Date  04/01/17  Referring Provider  Josephina Gip MD      Visit Diagnosis: NSTEMI (non-ST elevated myocardial infarction) [facility])  Patient's Home Medications on Admission:  Current Outpatient Prescriptions:  .  aspirin 81 MG EC tablet, Take 81 mg by mouth daily. Swallow whole., Disp: , Rfl:  .  cholecalciferol (VITAMIN D) 1000 units tablet, Take 1,000 Units by mouth daily., Disp: , Rfl:  .  clopidogrel (PLAVIX) 75 MG tablet, Take 1 tablet (75 mg total) by mouth daily., Disp: 30 tablet, Rfl: 0 .  docusate sodium (COLACE) 100 MG capsule, Take 200 mg by mouth 2 (two) times daily., Disp: , Rfl:  .  fexofenadine (ALLEGRA) 180 MG tablet, Take 180 mg by mouth daily., Disp: , Rfl:  .  furosemide (LASIX) 40 MG tablet, Take 1 tablet (40 mg total) by mouth daily., Disp: 30 tablet, Rfl: 1 .  glucagon (GLUCAGON EMERGENCY) 1 MG injection, Inject 1 mg into the vein once as needed. Reported on 02/11/2016, Disp: , Rfl:  .  insulin aspart (NOVOLOG) 100 UNIT/ML injection, Inject 20 Units into the skin 3 (three) times daily. (Patient taking differently: Inject 20 Units into the skin 3 (three) times daily with meals. ), Disp: 10 mL, Rfl: 5 .  insulin glargine (LANTUS) 100 UNIT/ML injection, Inject 0.24 mLs (24 Units total) into the skin at bedtime., Disp: 10 mL, Rfl: 5 .  latanoprost (XALATAN) 0.005 % ophthalmic solution, Place 1 drop into both eyes at bedtime., Disp: , Rfl:  .  metoprolol tartrate (LOPRESSOR) 25 MG tablet, Take 25 mg by mouth 2 (two) times daily., Disp: , Rfl:  .  Multiple Vitamin (MULTIVITAMIN) capsule, Take 1 capsule by mouth daily., Disp: , Rfl:   .  nitroGLYCERIN (NITROSTAT) 0.4 MG SL tablet, Place 1 tablet (0.4 mg total) under the tongue every 5 (five) minutes as needed for chest pain., Disp: 50 tablet, Rfl: 1 .  potassium chloride SA (K-DUR,KLOR-CON) 20 MEQ tablet, Take 1 tablet (20 mEq total) by mouth daily., Disp: 30 tablet, Rfl: 1 .  rosuvastatin (CRESTOR) 20 MG tablet, Take 1 tablet (20 mg total) by mouth daily at 6 PM., Disp: 30 tablet, Rfl: 1 .  vitamin C (ASCORBIC ACID) 500 MG tablet, Take 500 mg by mouth 2 (two) times daily. , Disp: , Rfl:   Past Medical History: Past Medical History:  Diagnosis Date  . CAD (coronary artery disease)    07/2007  . Diabetes ([place])   . Elevated lipids   . Glaucoma   . HBP (high blood pressure)   . Neuropathy   . Osteoarthritis   . Skin cancer   . Sleep apnea    CPAP    Tobacco Use: History  Smoking Status  . Former Smoker  . Packs/day: 1.00  . Years: 16.00  . Types: Cigarettes  . Start date: 11/03/1969  . Quit date: 07/22/1986  Smokeless Tobacco  . Never Used    Labs: Recent Review Flowsheet Data    Labs for ITP Cardiac and Pulmonary Rehab Latest Ref Rng & Units 06/27/2016 06/27/2016 06/28/2016 09/08/2016 03/18/2017   Cholestrol 0 - 200 mg/dL - - -  164 143   LDLCALC 0 - 99 mg/dL - - - 69 63   HDL >40 mg/dL - - - 81.00 67   Trlycerides <150 mg/dL - - - 69.0 63   Hemoglobin A1c 4.8 - 5.6 % - - - 7.9(H) 7.9(H)   PHART 7.350 - 7.450 - 7.390 - - -   PCO2ART 35.0 - 45.0 mmHg - 45.2(H) - - -   HCO3 20.0 - 24.0 mEq/L - 27.3(H) - - -   TCO2 0 - 100 mmol/L '26 29 26 ' - -   O2SAT % - 96.0 - - -       Exercise Target Goals: Date: 04/01/17  Exercise Program Goal: Individual exercise prescription set with THRR, safety & activity barriers. Participant demonstrates ability to understand and report RPE using BORG scale, to self-measure pulse accurately, and to acknowledge the importance of the exercise prescription.  Exercise Prescription Goal: Starting with aerobic activity 30 plus  minutes a day, 3 days per week for initial exercise prescription. Provide home exercise prescription and guidelines that participant acknowledges understanding prior to discharge.  Activity Barriers & Risk Stratification:     Activity Barriers & Cardiac Risk Stratification - 04/01/17 1644      Activity Barriers & Cardiac Risk Stratification   Activity Barriers Arthritis;Joint Problems;Muscular Weakness;Balance Concerns   Cardiac Risk Stratification High      6 Minute Walk:     6 Minute Walk    Row Name 12/08/16 1657 04/01/17 1509       6 Minute Walk   Phase Discharge Initial    Distance 1320 feet 1200 feet    Distance % Change 21.7 %  -    Walk Time 6 minutes 6 minutes    # of Rest Breaks 0 0    MPH 2.5 2.27    METS 2.91 2.22    RPE 14 12    VO2 Peak 8.4 7.76    Symptoms Yes (comment) No    Comments "burning" in throat,(not new during exercise) "fist" at lower sternum - sees Dr soon   -    Resting HR 94 bpm 82 bpm    Resting BP 132/64 124/70    Max Ex. HR 110 bpm 110 bpm    Max Ex. BP 144/60 152/64       Oxygen Initial Assessment:   Oxygen Re-Evaluation:   Oxygen Discharge (Final Oxygen Re-Evaluation):   Initial Exercise Prescription:     Initial Exercise Prescription - 04/01/17 1500      Date of Initial Exercise RX and Referring Provider   Date 04/01/17   Referring Provider Josephina Gip MD     Treadmill   MPH 1.7   Grade 0.5   Minutes 15   METs 2.42     Recumbant Elliptical   Level 1   RPM 50   Minutes 15   METs 2     Biostep-RELP   Level 1   SPM 50   Minutes 15   METs 2     Prescription Details   Frequency (times per week) 3   Duration Progress to 45 minutes of aerobic exercise without signs/symptoms of physical distress     Intensity   THRR 40-80% of Max Heartrate 110-138   Ratings of Perceived Exertion 11-13   Perceived Dyspnea 0-4     Progression   Progression Continue to progress workloads to maintain intensity without  signs/symptoms of physical distress.     Resistance Training   Training Prescription Yes  Weight 3 lbs   Reps 10-15      Perform Capillary Blood Glucose checks as needed.  Exercise Prescription Changes:     Exercise Prescription Changes    Row Name 10/09/16 1000 10/16/16 1000 10/31/16 0900 11/13/16 1200 11/27/16 1100     Response to Exercise   Blood Pressure (Admit)  - 126/68 132/64 122/70 124/66   Blood Pressure (Exercise)  - 140/64 148/80 128/70 142/70   Blood Pressure (Exit)  - 136/64 138/70 120/80 148/58   Heart Rate (Admit)  - 94 bpm 80 bpm 92 bpm 72 bpm   Heart Rate (Exercise)  - 105 bpm 120 bpm 113 bpm 107 bpm   Heart Rate (Exit)  - 86 bpm 91 bpm 80 bpm 83 bpm   Rating of Perceived Exertion (Exercise)  - '14 14 12 13   ' Symptoms  - none none none none   Comments  -  - Home Exercise Guidelines given 10/09/16  -  -   Duration  - Progress to 45 minutes of aerobic exercise without signs/symptoms of physical distress Progress to 45 minutes of aerobic exercise without signs/symptoms of physical distress Progress to 45 minutes of aerobic exercise without signs/symptoms of physical distress Progress to 45 minutes of aerobic exercise without signs/symptoms of physical distress   Intensity  - THRR unchanged THRR unchanged THRR unchanged THRR unchanged     Progression   Progression  - Continue to progress workloads to maintain intensity without signs/symptoms of physical distress. Continue to progress workloads to maintain intensity without signs/symptoms of physical distress. Continue to progress workloads to maintain intensity without signs/symptoms of physical distress. Continue to progress workloads to maintain intensity without signs/symptoms of physical distress.   Average METs  - 2.25 3.21  -  -     Resistance Training   Training Prescription  - Yes Yes Yes Yes   Weight  - '3 3 3 3   ' Reps  - 10-12 10-12 10-12 10-12     Interval Training   Interval Training  - No No No No      Treadmill   MPH  -  - 1.5  - 2   Grade  -  - 0.5  - 1   Minutes  -  - 15  - 15   METs  -  - 2.25  - 2.81     NuStep   Level  - '1 1 1  ' -   Minutes  - '15 15 15 15   ' METs  - 2.4 4.4 2.4 3.8     T5 Nustep   Level  - '1 4 4  ' -   Minutes  - '15 15 15  ' -   METs  - 2.1 3  -  -     Home Exercise Plan   Plans to continue exercise at [place] 1 additional day to program exercise sessions.  walking driveway, possibly join FF or Y with his wife  - Add 1 additional day to program exercise sessions.  walking driveway, possibly join FF or Y with his wife  -  -     Exercise Review   Progression  - Yes Yes  -  -   Row Name 12/11/16 1100 04/01/17 1500           Response to Exercise   Blood Pressure (Admit) 144/62 124/70      Blood Pressure (Exercise) 142/64 152/64  Blood Pressure (Exit) 130/72  -      Heart Rate (Admit) 87 bpm 82 bpm      Heart Rate (Exercise) 105 bpm 110 bpm      Heart Rate (Exit) 86 bpm  -      Oxygen Saturation (Admit)  - 97 %      Oxygen Saturation (Exercise)  - 97 %      Rating of Perceived Exertion (Exercise) 14 12      Symptoms  - none      Comments  - walk test results      Duration Progress to 45 minutes of aerobic exercise without signs/symptoms of physical distress  -      Intensity THRR unchanged  -        Progression   Progression Continue to progress workloads to maintain intensity without signs/symptoms of physical distress.  -        Resistance Training   Training Prescription Yes  -      Weight 3  -      Reps 10-12  -        Interval Training   Interval Training No  -        Treadmill   MPH 2  -      Grade 1  -      Minutes 15  -      METs 2.81  -        NuStep   Minutes 15  -      METs 2.3  -         Exercise Comments:     Exercise Comments    Row Name 10/09/16 1713 10/16/16 1036 10/23/16 1736 10/31/16 0950 11/13/16 1233   Exercise Comments Mr Laski asked for a BG check after cardio - it was 243 and BP  was 134/68.  He has had a sinus infection and has a headache.  He said he felt ok to do stretching at end of session. [name] is progressing well with exercise. [place] felt BG was low after 13 min. on TM.  It was 79.  He was given glucose gel and had crackers and it was 109 15 min later. [name] has continued to do well in rehab.  We will continue to monitor his progression. [name] is progressing well with exercise.   [employer] Name 11/27/16 1107 12/03/16 1706 12/11/16 1107 12/15/16 1821     Exercise Comments [name] is doing well with exercise.  Staff still monitors BG before and after as it has not been consistent. [name] was feeling dizzy during exrecise.  BP and BG were normal so he finished the session.   [name] is scheduled to have a stress test due to continued neck "burning".  His Dr allows him to continue exercise at this time. [name] completed the program today. Will be exercising in a [employer] class       Exercise Goals and Review:     Exercise Goals    Row Name 04/01/17 1513             Exercise Goals   Increase Physical Activity Yes       Intervention Provide advice, education, support and counseling about physical activity/exercise needs.;Develop an individualized exercise prescription for aerobic and resistive training based on initial evaluation findings, risk stratification, comorbidities and participant's personal goals.       Expected Outcomes Achievement of increased cardiorespiratory fitness and enhanced flexibility, muscular endurance and strength shown through measurements  of functional capacity and personal statement of participant.       Increase Strength and Stamina Yes       Intervention Provide advice, education, support and counseling about physical activity/exercise needs.;Develop an individualized exercise prescription for aerobic and resistive training based on initial evaluation findings, risk stratification, comorbidities and participant's personal goals.       Expected  Outcomes Achievement of increased cardiorespiratory fitness and enhanced flexibility, muscular endurance and strength shown through measurements of functional capacity and personal statement of participant.          Exercise Goals Re-Evaluation :   Discharge Exercise Prescription (Final Exercise Prescription Changes):     Exercise Prescription Changes - 04/01/17 1500      Response to Exercise   Blood Pressure (Admit) 124/70   Blood Pressure (Exercise) 152/64   Heart Rate (Admit) 82 bpm   Heart Rate (Exercise) 110 bpm   Oxygen Saturation (Admit) 97 %   Oxygen Saturation (Exercise) 97 %   Rating of Perceived Exertion (Exercise) 12   Symptoms none   Comments walk test results      Nutrition:  Target Goals: Understanding of nutrition guidelines, daily intake of sodium <157m, cholesterol <2072m calories 30% from fat and 7% or less from saturated fats, daily to have 5 or more servings of fruits and vegetables.  Biometrics:     Pre Biometrics - 04/01/17 1513      Pre Biometrics   Height 5' 10.8" (1.798 m)   Weight (!)  300 lb 1.6 oz (136.1 kg)   Waist Circumference 50.25 inches   Hip Circumference 54.5 inches   Waist to Hip Ratio 0.92 %   BMI (Calculated) 42.2   Single Leg Stand 1.43 seconds         Post Biometrics - 12/08/16 1656       Post  Biometrics   Height '5\' 10"'  (1.778 m)   Weight 296 lb (134.3 kg)   Waist Circumference 51 inches   Hip Circumference 55 inches   Waist to Hip Ratio 0.93 %   BMI (Calculated) 42.6   Single Leg Stand 2.47 seconds      Nutrition Therapy Plan and Nutrition Goals:     Nutrition Therapy & Goals - 04/01/17 1644      Nutrition Therapy   RD appointment defered Yes   Drug/Food Interactions Statins/Certain Fruits      Nutrition Discharge: Rate Your Plate Scores:     Nutrition Assessments - 04/01/17 1639      MEDFICTS Scores   Pre Score 67      Nutrition Goals Re-Evaluation:     Nutrition Goals Re-Evaluation     [place] 12/10/16 1637             Goals   Nutrition Goal Eating heart healthy food       Comment GaTallonas said he has met with more dieticians and knows what he is suppose to eat since he has had diabetes for years         Personal Goal #1 Re-Evaluation   Goal Progress Seen Yes          Nutrition Goals Discharge (Final Nutrition Goals Re-Evaluation):     Nutrition Goals Re-Evaluation - 12/10/16 1637      Goals   Nutrition Goal Eating heart healthy food   Comment GaJaradas said he has met with more dieticians and knows what he is suppose to eat since he has had diabetes for years  Personal Goal #1 Re-Evaluation   Goal Progress Seen Yes      Psychosocial: Target Goals: Acknowledge presence or absence of significant depression and/or stress, maximize coping skills, provide positive support system. Participant is able to verbalize types and ability to use techniques and skills needed for reducing stress and depression.   Initial Review & Psychosocial Screening:     Initial Psych Review & Screening - 04/01/17 1646      Initial Review   Current issues with Current Sleep Concerns     Family Dynamics   Good Support System? Yes     Barriers   Psychosocial barriers to participate in program The patient should benefit from training in stress management and relaxation.     Screening Interventions   Interventions Encouraged to exercise      Quality of Life Scores:      Quality of Life - 04/01/17 1629      Quality of Life Scores   Health/Function Pre 11.2 %   Socioeconomic Pre 22.75 %   Psych/Spiritual Pre 19.5 %   Family Pre 25.5 %   GLOBAL Pre 16.97 %      PHQ-9: Recent Review Flowsheet Data    Depression screen [facility] 2/9 04/01/2017 12/12/2016 09/11/2016 09/05/2016   Decreased Interest 1 1 0 0   Down, Depressed, Hopeless '1 1 1 ' 0   PHQ - 2 Score '2 2 1 ' 0   Altered sleeping '1 3 1 ' -   Tired, decreased energy '1 1 1 ' -   Change in appetite 2 1 0 -   Feeling  bad or failure about yourself  '2 1 1 ' -   Trouble concentrating '1 1 1 ' -   Moving slowly or fidgety/restless '1 1 1 ' -   Suicidal thoughts 0 0 0 -   PHQ-9 Score '10 10 6 ' -   Difficult doing work/chores Somewhat difficult Somewhat difficult Somewhat difficult -     Interpretation of Total Score  Total Score Depression Severity:  1-4 = Minimal depression, 5-9 = Mild depression, 10-14 = Moderate depression, 15-19 = Moderately severe depression, 20-27 = Severe depression   Psychosocial Evaluation and Intervention:     Psychosocial Evaluation - 12/15/16 1736      Discharge Psychosocial Assessment & Intervention   Comments Counselor follow up prior to discharge with Mr. Strey (Mr A) today.  His Quality of Life scores and PHQ-9 scores were not as positive as when he came into this program.  Mr. A shared that his health issues had "taken over" his life and he is struggling to cope with it all.  His spouse also has MS and her health is not great either.  Mr. A stated all of the medical procedures and tests being run have forced him to "look at my health more seriously," and now he realizes he needs to do something about it.  He has a stress test scheduled for next week; his cataract surgeries and his skin grafts have been difficult to heal; especially since he is a diabetic.  He states he reads and tries to think about the positives to cope.  He also states he "feels better and his blood sugar numbers are down" when he works out - so this has been helpful.  He plans to Continue with the [employer] class twice weekly and hopes at some point his spouse can join him in that program.  Mr. A & his spouse have been designated as "great grand-parents" to their neighbors new baby  and this motivates him and gives him a new sense of purpose.  Counselor encouraged Mr. A to continue his commitment to improved health with exercise and proper diet, and commended him on his progress to date.         Psychosocial  Re-Evaluation:     Psychosocial Re-Evaluation    Row Name 11/27/16 1356 12/01/16 1659 12/03/16 1711 12/10/16 1633       Psychosocial Re-Evaluation   Comments [name] was sorry he was unable to attend Cardiac Rehab for 2 weeks due to having a skin graft on his tow.  [name] continues to keep stress levels low by using coping skills to release stress if it occurs.   Expected outcome is that [name] will continue to use his coping skills to keep any stress under control. Counselor follow up with Mr. Knutzen reporting "not doing well today" due to his sugar levels being up and down and other health issues causing him some concern.  However, he admits that he does feel better after working out; although he has to "force" himself to come - he is seeing the benefits.  Counselor commended Mr. Raybon for his commitment to improving his health and to exercise more consistently.  He sees the Dr. next week to get results back from one of his tests and he is a little concerned about that.  Mr. Franks will be graduating from this program in a few weeks and is looking into a follow up program to maintain his progress and continue healthy habits.   Amon said he just met with his cardiologist and told him of his neck fullness so Gaspard reports the MD ordered a stress test for him for Feb 24th. His wife is here today listening with him to the Stress Education being given by our Scissors, MSW>     Interventions  - Encouraged to attend Cardiac Rehabilitation for the exercise  -  -    Continue Psychosocial Services   - No  -  -       Psychosocial Discharge (Final Psychosocial Re-Evaluation):     Psychosocial Re-Evaluation - 12/10/16 1633      Psychosocial Re-Evaluation   Comments Kolbee said he just met with his cardiologist and told him of his neck fullness so Trevyn reports the MD ordered a stress test for him for Feb 24th. His wife is here today listening with him to the Stress Education  being given by our [place], MSW>       Vocational Rehabilitation: Provide vocational rehab assistance to qualifying candidates.   Vocational Rehab Evaluation & Intervention:     Vocational Rehab - 04/01/17 1635      Initial Vocational Rehab Evaluation & Intervention   Assessment shows need for Vocational Rehabilitation No      Education: Education Goals: Education classes will be provided on a weekly basis, covering required topics. Participant will state understanding/return demonstration of topics presented.  Learning Barriers/Preferences:     Learning Barriers/Preferences - 04/01/17 1634      Learning Barriers/Preferences   Learning Barriers Sight   Learning Preferences None      Education Topics: General Nutrition Guidelines/Fats and Fiber: -Group instruction provided by verbal, written material, models and posters to present the general guidelines for heart healthy nutrition. Gives an explanation and review of dietary fats and fiber.   Controlling Sodium/Reading Food Labels: -Group verbal and written material supporting the discussion of sodium use in heart healthy nutrition.  Review and explanation with models, verbal and written materials for utilization of the food label.   Cardiac Rehab from 12/15/2016 in [facility] Cardiac and Pulmonary Rehab  Date  09/22/16  Educator  PI  Instruction Review Code  2- meets goals/outcomes      Exercise Physiology & Risk Factors: - Group verbal and written instruction with models to review the exercise physiology of the cardiovascular system and associated critical values. Details cardiovascular disease risk factors and the goals associated with each risk factor.   Cardiac Rehab from 12/15/2016 in [facility] Cardiac and Pulmonary Rehab  Date  11/24/16  Educator  [facility]  Instruction Review Code  2- meets goals/outcomes      Aerobic Exercise & Resistance Training: - Gives group verbal and written discussion on  the health impact of inactivity. On the components of aerobic and resistive training programs and the benefits of this training and how to safely progress through these programs.   Cardiac Rehab from 12/15/2016 in [facility] Cardiac and Pulmonary Rehab  Date  11/26/16  Educator  Rema Fendt.  Instruction Review Code  2- meets goals/outcomes      Flexibility, Balance, General Exercise Guidelines: - Provides group verbal and written instruction on the benefits of flexibility and balance training programs. Provides general exercise guidelines with specific guidelines to those with heart or lung disease. Demonstration and skill practice provided.   Cardiac Rehab from 12/15/2016 in [facility] Cardiac and Pulmonary Rehab  Date  12/01/16  Educator  [facility]  Instruction Review Code  2- meets goals/outcomes      Stress Management: - Provides group verbal and written instruction about the health risks of elevated stress, cause of high stress, and healthy ways to reduce stress.   Cardiac Rehab from 12/15/2016 in [facility] Cardiac and Pulmonary Rehab  Date  12/10/16  Educator  [facility]  Instruction Review Code  2- meets goals/outcomes      Depression: - Provides group verbal and written instruction on the correlation between heart/lung disease and depressed mood, treatment options, and the stigmas associated with seeking treatment.   Cardiac Rehab from 12/15/2016 in [facility] Cardiac and Pulmonary Rehab  Date  09/17/16  Educator  Berle Mull, MSW  Instruction Review Code  2- meets goals/outcomes      Anatomy & Physiology of the Heart: - Group verbal and written instruction and models provide basic cardiac anatomy and physiology, with the coronary electrical and arterial systems. Review of: AMI, Angina, Valve disease, Heart Failure, Cardiac Arrhythmia, Pacemakers, and the ICD.   Cardiac Rehab from 12/15/2016 in [facility] Cardiac and Pulmonary Rehab  Date  12/08/16  Educator  C. Prince Frederick  Instruction Review Code  2- meets  goals/outcomes      Cardiac Procedures: - Group verbal and written instruction and models to describe the testing methods done to diagnose heart disease. Reviews the outcomes of the test results. Describes the treatment choices: Medical Management, Angioplasty, or Coronary Bypass Surgery.   Cardiac Rehab from 12/15/2016 in [facility] Cardiac and Pulmonary Rehab  Date  12/15/16  Educator  CE  Instruction Review Code  2- meets goals/outcomes      Cardiac Medications: - Group verbal and written instruction to review commonly prescribed medications for heart disease. Reviews the medication, class of the drug, and side effects. Includes the steps to properly store meds and maintain the prescription regimen.   Cardiac Rehab from 12/15/2016 in [facility] Cardiac and Pulmonary Rehab  Date  10/22/16 Marisue Humble 2]  Educator  SB  Instruction Review Code  2- meets goals/outcomes      Go Sex-Intimacy & Heart Disease, Get SMART - Goal Setting: - Group verbal and written instruction through game format to discuss heart disease and the return to sexual intimacy. Provides group verbal and written material to discuss and apply goal setting through the application of the S.M.A.R.T. Method.   Cardiac Rehab from 12/15/2016 in [facility] Cardiac and Pulmonary Rehab  Date  12/15/16  Educator  CE  Instruction Review Code  2- meets goals/outcomes      Other Matters of the Heart: - Provides group verbal, written materials and models to describe Heart Failure, Angina, Valve Disease, and Diabetes in the realm of heart disease. Includes description of the disease process and treatment options available to the cardiac patient.   Cardiac Rehab from 12/15/2016 in [facility] Cardiac and Pulmonary Rehab  Date  12/08/16  Educator  C. [place]  Instruction Review Code  2- meets goals/outcomes      Exercise & Equipment Safety: - Individual verbal instruction and demonstration of equipment use and safety with use of the equipment.   Cardiac  Rehab from 04/01/2017 in [facility] Cardiac and Pulmonary Rehab  Date  04/01/17  Educator  C. EnterkinRN  Instruction Review Code  1- partially meets, needs review/practice      Infection Prevention: - Provides verbal and written material to individual with discussion of infection control including proper hand washing and proper equipment cleaning during exercise session.   Cardiac Rehab from 04/01/2017 in [facility] Cardiac and Pulmonary Rehab  Date  04/01/17  Educator  C. EnterkinRN  Instruction Review Code  2- meets goals/outcomes      Falls Prevention: - Provides verbal and written material to individual with discussion of falls prevention and safety.   Cardiac Rehab from 04/01/2017 in [facility] Cardiac and Pulmonary Rehab  Date  04/01/17  Educator  C. [place]  Instruction Review Code  2- meets goals/outcomes      Diabetes: - Individual verbal and written instruction to review signs/symptoms of diabetes, desired ranges of glucose level fasting, after meals and with exercise. Advice that pre and post exercise glucose checks will be done for 3 sessions at entry of program.   Cardiac Rehab from 04/01/2017 in [facility] Cardiac and Pulmonary Rehab  Date  04/01/17  Educator  C. [place]  Instruction Review Code  1- partially meets, needs review/practice       Knowledge Questionnaire Score:     Knowledge Questionnaire Score - 04/01/17 1634      Knowledge Questionnaire Score   Pre Score 16      Core Components/Risk Factors/Patient Goals at Admission:     Personal Goals and Risk Factors at Admission - 04/01/17 1645      Core Components/Risk Factors/Patient Goals on Admission   Admit Weight 300 lb (136.1 kg)   Goal Weight: Short Term 295 lb (133.8 kg)   Goal Weight: Long Term 220 lb (99.8 kg)   Expected Outcomes Short Term: Continue to assess and modify interventions until short term weight is achieved;Weight Loss: Understanding of general recommendations for a balanced deficit meal plan,  which promotes 1-2 lb weight loss per week and includes a negative energy balance of [phone] kcal/d   Diabetes Yes   Intervention Provide education about signs/symptoms and action to take for hypo/hyperglycemia.;Provide education about proper nutrition, including hydration, and aerobic/resistive exercise prescription along with prescribed medications to achieve blood glucose in normal ranges: Fasting glucose 65-99 mg/dL   Expected Outcomes Short Term: Participant verbalizes understanding of  the signs/symptoms and immediate care of hyper/hypoglycemia, proper foot care and importance of medication, aerobic/resistive exercise and nutrition plan for blood glucose control.;Long Term: Attainment of HbA1C < 7%.   Hypertension Yes   Intervention Provide education on lifestyle modifcations including regular physical activity/exercise, weight management, moderate sodium restriction and increased consumption of fresh fruit, vegetables, and low fat dairy, alcohol moderation, and smoking cessation.;Monitor prescription use compliance.   Expected Outcomes Short Term: Continued assessment and intervention until BP is < 140/26m HG in hypertensive participants. < 130/8104mHG in hypertensive participants with diabetes, heart failure or chronic kidney disease.;Long Term: Maintenance of blood pressure at goal levels.   Lipids Yes   Intervention Provide education and support for participant on nutrition & aerobic/resistive exercise along with prescribed medications to achieve LDL <7046mHDL >64m22m Expected Outcomes Short Term: Participant states understanding of desired cholesterol values and is compliant with medications prescribed. Participant is following exercise prescription and nutrition guidelines.;Long Term: Cholesterol controlled with medications as prescribed, with individualized exercise RX and with personalized nutrition plan. Value goals: LDL < 70mg47mL > 40 mg.      Core Components/Risk Factors/Patient  Goals Review:      Goals and Risk Factor Review    Row Name 10/23/16 1103 11/27/16 1353 12/01/16 1656 12/10/16 1634 12/10/16 1635     Core Components/Risk Factors/Patient Goals Review   Personal Goals Review Weight Management/Obesity;Diabetes;Hypertension Sedentary;Diabetes Diabetes;Hypertension;Lipids Weight Management/Obesity;Sedentary;Diabetes Hypertension;Diabetes;Lipids   Review WaynePatrick Jupiterrts his BG has been between 104-213 measuring at home.  His BP has been steady.  [facility] and doesn't feel he needs to meet with them here.  he feels the most important thing right now is regular exercise.  He was given info on the [employer] discussed the options for when he graduates. GarlaHeltonnePatrick Jupiter unable to attend Cardiac REhab due to a skin graft on his toe. WaynePatrick Jupiterclearnance to return "as long as he did not stub his toe'. WaynePatrick Jupiterbeen able to walk on the treadmill since his return with no problems. [name]'s blood sugars have been flucuating. One time he asked me to check his blood sugar since he felt it was low and it was like 180. We check Wayn'es blood sugars frequenlty in Cardiac Rehab.  WaynePatrick Jupiteres he is compliant with his BP and cholesterol meds and he is attempting to follow the proper nutrition plan.  Jahrel said his diabetes blood sugars for years have been up and down but he notices since he has been exercising his blood sugar have been better.  Margie's blood pressure has been better. His MD follows up with his lipid blood work and he is working on exercising and his diet to help that blood work look better.    Expected Outcomes WaynePatrick Jupiter continue to develop regular exercise habits in Heart Track and will maintain after he graduates.  Regular exercise will help him control his risk factors and reduce risk of another CV event.  - Continue adherence to risk factor control  - To get healthier-heart healthier lifestyle.      Core  Components/Risk Factors/Patient Goals at Discharge (Final Review):      Goals and Risk Factor Review - 12/10/16 1635      Core Components/Risk Factors/Patient Goals Review   Personal Goals Review Hypertension;Diabetes;Lipids   Review Georgie's blood pressure has been better. His MD follows up with his lipid blood work and he is  working on exercising and his diet to help that blood work look better.    Expected Outcomes To get healthier-heart healthier lifestyle.      ITP Comments:     ITP Comments    Row Name 10/06/16 1812 10/08/16 8882 10/30/16 1655 11/04/16 0631 12/01/16 1739   ITP Comments McNeal stsed he was experiencing pressure in his throat/chest / on pain scale. This occured while on the treadmill, he was slowed down and symptoms reduced/ He was stopped and the symptoms resolved.  He stated he was experiencing throat/sinus drainage today and felt stuffed up. He was advised to call his PMd and his cardiololgist to report the various symptoms. He had experienced the throat discomfort over the weekend at times too.  30 day review completed for review by Dr Emily Filbert.  Continue with ITP unless changes noted by Dr Sabra Heck. "[name]" Ambrosio said he has had a rough year this past year with carpel tunnel syndrom surgery and other medical problems. [name] said he didn't feel well today after exercising for about 10  minutes on the Nustep. His blood sugar was 125 and his blood pressure was stable at 140/80.  30 day review. Continue with ITP unless directed changes per Medical Director review. [place] complaioned of burning sensation in his throat during exercise today. He was asked to slow downa nd the symptoms resolved with less workload. He was advised to keep his workload at the lower level.  No more symptoms occured today.  Review with [name] found that he has had the symptoms prior to today with exertion. He has been advised to talk to his physician about the symptoms. He was also advised to call 911  if symptoms occur and do not resolve with rest.  He verbalized understanding of the advice provided today   [place] Name 12/03/16 0630 12/10/16 1638 12/15/16 1823 04/01/17 1643     ITP Comments 30 day review. Continue with ITP unless directed changes per Medical Director review. Gurjit said he told the MD today that he has been having neck fullness which stops when he stops exercising. Tavion said his MD scheduled him for a stress test that first available Feb 24th. I instructed him during exericse here to stop and let us know if he has that neck fullness that does not go away. Darl said he does have NTG but he has not had to take one since rest stops his neck fullness. I instructed Reza Crymes" to call 911 if he is not here and it does not go away. Discharged today after completion of the program.  Will join [employer].  Medical review started after informed consent was signed for Cardiac REhab then ITP was created. Diagnosis in CHL/EPIC.       Comments: Ready to start Cardiac Rehab. 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

## 2017-04-01 NOTE — Progress Notes (Signed)
Daily Session Note  Patient Details  Name: Lawrence [name] MRN: [id number] Date of Birth: 1951-03-08 Referring Provider:     Cardiac Rehab from 04/01/2017 in [facility] Cardiac and Pulmonary Rehab  Referring Provider  Josephina Gip MD      Encounter Date: 04/01/2017  Check In:     Session Check In - 04/01/17 1635      Check-In   Location [facility]-Cardiac & Pulmonary Rehab   Staff Present Gerlene Burdock, RN, Vickki Hearing, BA, ACSM CEP, Exercise Physiologist   Supervising physician immediately available to respond to emergencies See telemetry face sheet for immediately available ER MD   Medication changes reported     No   Fall or balance concerns reported    Yes   Comments walks with a cane   Tobacco Cessation No Change   Warm-up and Cool-down Performed as group-led instruction   Resistance Training Performed Yes   VAD Patient? No     Pain Assessment   Currently in Pain? No/denies           Exercise Prescription Changes - 04/01/17 1500      Response to Exercise   Blood Pressure (Admit) 124/70   Blood Pressure (Exercise) 152/64   Heart Rate (Admit) 82 bpm   Heart Rate (Exercise) 110 bpm   Oxygen Saturation (Admit) 97 %   Oxygen Saturation (Exercise) 97 %   Rating of Perceived Exertion (Exercise) 12   Symptoms none   Comments walk test results      History  Smoking Status  . Former Smoker  . Packs/day: 1.00  . Years: 16.00  . Types: Cigarettes  . Start date: 11/03/1969  . Quit date: 07/22/1986  Smokeless Tobacco  . Never Used    Goals Met:  Proper associated with RPD/PD & O2 Sat Exercise tolerated well Personal goals reviewed No report of cardiac concerns or symptoms Strength training completed today  Goals Unmet:  Not Applicable  Comments:     Dr. Emily Filbert is Medical Director for [place] and [facility].

## 2017-04-01 NOTE — Patient Instructions (Signed)
Patient Instructions  Patient Details  Name: Lawrence [name] MRN: [id number] Date of Birth: 11-Mar-1951 Referring Provider:  Wellington Hampshire, MD  Below are the personal goals you chose as well as exercise and nutrition goals. Our goal is to help you keep on track towards obtaining and maintaining your goals. We will be discussing your progress on these goals with you throughout the program.  Initial Exercise Prescription:     Initial Exercise Prescription - 04/01/17 1500      Date of Initial Exercise RX and Referring Provider   Date 04/01/17   Referring Provider Josephina Gip MD     Treadmill   MPH 1.7   Grade 0.5   Minutes 15   METs 2.42     Recumbant Elliptical   Level 1   RPM 50   Minutes 15   METs 2     Biostep-RELP   Level 1   SPM 50   Minutes 15   METs 2     Prescription Details   Frequency (times per week) 3   Duration Progress to 45 minutes of aerobic exercise without signs/symptoms of physical distress     Intensity   THRR 40-80% of Max Heartrate 110-138   Ratings of Perceived Exertion 11-13   Perceived Dyspnea 0-4     Progression   Progression Continue to progress workloads to maintain intensity without signs/symptoms of physical distress.     Resistance Training   Training Prescription Yes   Weight 3 lbs   Reps 10-15      Exercise Goals: Frequency: Be able to perform aerobic exercise three times per week working toward 3-5 days per week.  Intensity: Work with a perceived exertion of 11 (fairly light) - 15 (hard) as tolerated. Follow your new exercise prescription and watch for changes in prescription as you progress with the program. Changes will be reviewed with you when they are made.  Duration: You should be able to do 30 minutes of continuous aerobic exercise in addition to a 5 minute warm-up and a 5 minute cool-down routine.  Nutrition Goals: Your personal nutrition goals will be established when you do your nutrition analysis with the  dietician.  The following are nutrition guidelines to follow: Cholesterol < 200mg /day Sodium < 1500mg /day Fiber: Men over 50 yrs - 30 grams per day  Personal Goals:     Personal Goals and Risk Factors at Admission - 04/01/17 1645      Core Components/Risk Factors/Patient Goals on Admission   Admit Weight 300 lb (136.1 kg)   Goal Weight: Short Term 295 lb (133.8 kg)   Goal Weight: Long Term 220 lb (99.8 kg)   Expected Outcomes Short Term: Continue to assess and modify interventions until short term weight is achieved;Weight Loss: Understanding of general recommendations for a balanced deficit meal plan, which promotes 1-2 lb weight loss per week and includes a negative energy balance of [phone] kcal/d   Diabetes Yes   Intervention Provide education about signs/symptoms and action to take for hypo/hyperglycemia.;Provide education about proper nutrition, including hydration, and aerobic/resistive exercise prescription along with prescribed medications to achieve blood glucose in normal ranges: Fasting glucose 65-99 mg/dL   Expected Outcomes Short Term: Participant verbalizes understanding of the signs/symptoms and immediate care of hyper/hypoglycemia, proper foot care and importance of medication, aerobic/resistive exercise and nutrition plan for blood glucose control.;Long Term: Attainment of HbA1C < 7%.   Hypertension Yes   Intervention Provide education on lifestyle modifcations including regular physical activity/exercise, weight  management, moderate sodium restriction and increased consumption of fresh fruit, vegetables, and low fat dairy, alcohol moderation, and smoking cessation.;Monitor prescription use compliance.   Expected Outcomes Short Term: Continued assessment and intervention until BP is < 140/71mm HG in hypertensive participants. < 130/63mm HG in hypertensive participants with diabetes, heart failure or chronic kidney disease.;Long Term: Maintenance of blood pressure at goal  levels.   Lipids Yes   Intervention Provide education and support for participant on nutrition & aerobic/resistive exercise along with prescribed medications to achieve LDL 70mg , HDL >40mg .   Expected Outcomes Short Term: Participant states understanding of desired cholesterol values and is compliant with medications prescribed. Participant is following exercise prescription and nutrition guidelines.;Long Term: Cholesterol controlled with medications as prescribed, with individualized exercise RX and with personalized nutrition plan. Value goals: LDL < 70mg , HDL > 40 mg.      Tobacco Use Initial Evaluation: History  Smoking Status  . Former Smoker  . Packs/day: 1.00  . Years: 16.00  . Types: Cigarettes  . Start date: 11/03/1969  . Quit date: 07/22/1986  Smokeless Tobacco  . Never Used    Copy of goals given to participant.

## 2017-04-02 ENCOUNTER — Telehealth: Payer: Self-pay

## 2017-04-02 DIAGNOSIS — B354 Tinea corporis: Secondary | ICD-10-CM | POA: Diagnosis not present

## 2017-04-02 NOTE — Telephone Encounter (Signed)
PLEASE NOTE: All timestamps contained within this report are represented as [place] Standard Time. CONFIDENTIALTY NOTICE: This fax transmission is intended only for the addressee. It contains information that is legally privileged, confidential or otherwise protected from use or disclosure. If you are not the intended recipient, you are strictly prohibited from reviewing, disclosing, copying using or disseminating any of this information or taking any action in reliance on or regarding this information. If you have received this fax in error, please notify us immediately by telephone so that we can arrange for its return to [place]. Phone: [phone], Toll-Free: [phone], Fax: [phone] Page: 1 of 2 Call Id: [id number] [place] Patient Name: Lawrence [name] Gender: Male DOB: [birth year] Age: 66 Y 3 M 8 D Return Phone Number: [phone] (Primary), [phone] (Secondary) City/State/Zip: [place] Client [facility] - Client Client Site [place] - Day Physician Alma Friendly - NP Who Is Calling Patient / Member / Family / Caregiver Call Type Triage / Clinical Caller Name Lawrence [name] Relationship To Patient Spouse Return Phone Number [phone] (Primary) Chief Complaint Rash - Localized Reason for Call Symptomatic / Request for [place] states husband has a rash on his legs. Appointment Disposition EMR Appointment Attempted - Not Scheduled Info pasted into Epic Yes [place] [facility] walk-in [facility] Nurse Assessment Nurse: Erling Cruz, RN, [place] Date/Time Eilene Ghazi Time): 04/02/2017 12:10:00 PM Confirm and document reason for call. If symptomatic, describe symptoms. ---Caller states he has a rash on both lower legs that started this morning. Red, splotchy, not blisters, raised. No fever or other symptoms.  No new meds. Does the PT have any chronic conditions? (i.e. diabetes, asthma, etc.) ---Yes List chronic conditions. ---DM, MI 2008, CABG x4 Aug 2017, [place] 03/17/17 Guidelines Guideline Title Affirmed Question Rash or Redness - Localized [1] Looks infected (spreading redness, pus) AND [2] diabetes mellitus or weak immune system (e.g., HIV positive, cancer chemotherapy, chronic steroid treatment, splenectomy) Disp. Time Eilene Ghazi Time) Disposition Final User 04/02/2017 12:20:35 PM See Physician within 4 Hours (or PCP triage) Yes Love, RN, [place] Advice Given Per Guideline SEE PHYSICIAN WITHIN 4 HOURS (or PCP triage): * IF OFFICE WILL BE OPEN: You need to be seen within the next 3 or 4 hours. Call your doctor's office now or as soon as it opens. CALL BACK IF: * You become worse. CARE ADVICE given per Rash - Localized and Cause Unknown (Adult) guideline. PLEASE NOTE: All timestamps contained within this report are represented as [place] Standard Time. CONFIDENTIALTY NOTICE: This fax transmission is intended only for the addressee. It contains information that is legally privileged, confidential or otherwise protected from use or disclosure. If you are not the intended recipient, you are strictly prohibited from reviewing, disclosing, copying using or disseminating any of this information or taking any action in reliance on or regarding this information. If you have received this fax in error, please notify us immediately by telephone so that we can arrange for its return to [place]. Phone: [phone], Toll-Free: [phone], Fax: [phone] Page: 2 of 2 Call Id: [id number] Comments User: Germain Osgood, RN Date/Time Eilene Ghazi Time): 04/02/2017 12:21:27 PM Appt at [facility] not available & caller doesn't want to go to another office. He states he will go to nearest UC.

## 2017-04-02 NOTE — Telephone Encounter (Signed)
Noted  

## 2017-04-06 ENCOUNTER — Encounter: Payer: Medicare Other | Attending: Cardiovascular Disease | Admitting: *Deleted

## 2017-04-06 DIAGNOSIS — Z79899 Other long term (current) drug therapy: Secondary | ICD-10-CM | POA: Diagnosis not present

## 2017-04-06 DIAGNOSIS — Z87891 Personal history of nicotine dependence: Secondary | ICD-10-CM | POA: Diagnosis not present

## 2017-04-06 DIAGNOSIS — I214 Non-ST elevation (NSTEMI) myocardial infarction: Secondary | ICD-10-CM | POA: Diagnosis not present

## 2017-04-06 DIAGNOSIS — Z7902 Long term (current) use of antithrombotics/antiplatelets: Secondary | ICD-10-CM | POA: Insufficient documentation

## 2017-04-06 DIAGNOSIS — I2510 Atherosclerotic heart disease of native coronary artery without angina pectoris: Secondary | ICD-10-CM | POA: Insufficient documentation

## 2017-04-06 DIAGNOSIS — E119 Type 2 diabetes mellitus without complications: Secondary | ICD-10-CM | POA: Diagnosis not present

## 2017-04-06 DIAGNOSIS — Z7982 Long term (current) use of aspirin: Secondary | ICD-10-CM | POA: Diagnosis not present

## 2017-04-06 DIAGNOSIS — Z794 Long term (current) use of insulin: Secondary | ICD-10-CM | POA: Insufficient documentation

## 2017-04-06 DIAGNOSIS — Z951 Presence of aortocoronary bypass graft: Secondary | ICD-10-CM

## 2017-04-06 LAB — GLUCOSE, CAPILLARY
Glucose-Capillary: 195 mg/dL — ABNORMAL HIGH (ref 65–99)
Glucose-Capillary: 182 mg/dL — ABNORMAL HIGH (ref 65–99)

## 2017-04-06 NOTE — Progress Notes (Signed)
Daily Session Note  Patient Details  Name: Lawrence [name] MRN: [id number] Date of Birth: 10/21/1951 Referring Provider:     Cardiac Rehab from 04/01/2017 in [facility] [place] Cardiac and Pulmonary Rehab  Referring Provider  Josephina Gip MD      Encounter Date: 04/06/2017  Check In:     Session Check In - 04/06/17 1643      Check-In   Location [facility]-Cardiac & Pulmonary Rehab   Staff Present Heath Lark, RN, BSN, Florestine Avers, RN Moises Blood, BS, ACSM CEP, Exercise Physiologist   Supervising physician immediately available to respond to emergencies See telemetry face sheet for immediately available ER MD   Medication changes reported     No   Fall or balance concerns reported    Yes   Warm-up and Cool-down Performed on first and last piece of equipment   Resistance Training Performed Yes   VAD Patient? No     Pain Assessment   Currently in Pain? No/denies   Multiple Pain Sites No         History  Smoking Status  . Former Smoker  . Packs/day: 1.00  . Years: 16.00  . Types: Cigarettes  . Start date: 11/03/1969  . Quit date: 07/22/1986  Smokeless Tobacco  . Never Used    Goals Met:  Exercise tolerated well Personal goals reviewed No report of cardiac concerns or symptoms Strength training completed today  Goals Unmet:  Not Applicable  Comments:  First full day of exercise!  [name] was oriented to gym and equipment including functions, settings, policies, and procedures.  [name]'s individual exercise prescription and treatment plan were reviewed.  All starting workloads were established based on the results of the 6 minute walk test done at initial orientation visit.  The plan for exercise progression was also introduced and progression will be customized based on patient's performance and goals.    Dr. Emily Filbert is Medical Director for [place] and [facility].

## 2017-04-07 DIAGNOSIS — I25708 Atherosclerosis of coronary artery bypass graft(s), unspecified, with other forms of angina pectoris: Secondary | ICD-10-CM | POA: Diagnosis not present

## 2017-04-07 DIAGNOSIS — I214 Non-ST elevation (NSTEMI) myocardial infarction: Secondary | ICD-10-CM | POA: Diagnosis not present

## 2017-04-07 DIAGNOSIS — I10 Essential (primary) hypertension: Secondary | ICD-10-CM | POA: Diagnosis not present

## 2017-04-08 ENCOUNTER — Encounter: Payer: Medicare Other | Admitting: *Deleted

## 2017-04-08 DIAGNOSIS — Z7982 Long term (current) use of aspirin: Secondary | ICD-10-CM | POA: Diagnosis not present

## 2017-04-08 DIAGNOSIS — I214 Non-ST elevation (NSTEMI) myocardial infarction: Secondary | ICD-10-CM

## 2017-04-08 DIAGNOSIS — Z794 Long term (current) use of insulin: Secondary | ICD-10-CM | POA: Diagnosis not present

## 2017-04-08 DIAGNOSIS — I2510 Atherosclerotic heart disease of native coronary artery without angina pectoris: Secondary | ICD-10-CM | POA: Diagnosis not present

## 2017-04-08 DIAGNOSIS — Z7902 Long term (current) use of antithrombotics/antiplatelets: Secondary | ICD-10-CM | POA: Diagnosis not present

## 2017-04-08 DIAGNOSIS — Z79899 Other long term (current) drug therapy: Secondary | ICD-10-CM | POA: Diagnosis not present

## 2017-04-08 NOTE — Progress Notes (Signed)
Daily Session Note  Patient Details  Name: Lawrence [name] MRN: [id number] Date of Birth: December 01, 1950 Referring Provider:     Cardiac Rehab from 04/01/2017 in [facility] Cardiac and Pulmonary Rehab  Referring Provider  Josephina Gip MD      Encounter Date: 04/08/2017  Check In:     Session Check In - 04/08/17 1720      Check-In   Location [facility]-Cardiac & Pulmonary Rehab   Staff Present Gerlene Burdock, RN, Geralyn Corwin, RN Vickki Hearing, BA, ACSM CEP, Exercise Physiologist   Supervising physician immediately available to respond to emergencies See telemetry face sheet for immediately available ER MD   Medication changes reported     No   Fall or balance concerns reported    No   Tobacco Cessation No Change   Resistance Training Performed Yes   VAD Patient? No     Pain Assessment   Currently in Pain? No/denies         History  Smoking Status  . Former Smoker  . Packs/day: 1.00  . Years: 16.00  . Types: Cigarettes  . Start date: 11/03/1969  . Quit date: 07/22/1986  Smokeless Tobacco  . Never Used    Goals Met:  Proper associated with RPD/PD & O2 Sat No report of cardiac concerns or symptoms  Goals Unmet:  Not Applicable  Comments:     Dr. Emily Filbert is Medical Director for [place] and [facility].

## 2017-04-09 DIAGNOSIS — Z794 Long term (current) use of insulin: Secondary | ICD-10-CM | POA: Diagnosis not present

## 2017-04-09 DIAGNOSIS — Z951 Presence of aortocoronary bypass graft: Secondary | ICD-10-CM

## 2017-04-09 DIAGNOSIS — I2510 Atherosclerotic heart disease of native coronary artery without angina pectoris: Secondary | ICD-10-CM | POA: Diagnosis not present

## 2017-04-09 DIAGNOSIS — Z7902 Long term (current) use of antithrombotics/antiplatelets: Secondary | ICD-10-CM | POA: Diagnosis not present

## 2017-04-09 DIAGNOSIS — Z79899 Other long term (current) drug therapy: Secondary | ICD-10-CM | POA: Diagnosis not present

## 2017-04-09 DIAGNOSIS — I214 Non-ST elevation (NSTEMI) myocardial infarction: Secondary | ICD-10-CM | POA: Diagnosis not present

## 2017-04-09 DIAGNOSIS — Z7982 Long term (current) use of aspirin: Secondary | ICD-10-CM | POA: Diagnosis not present

## 2017-04-09 LAB — GLUCOSE, CAPILLARY
GLUCOSE-CAPILLARY: 125 mg/dL — AB (ref 65–99)
GLUCOSE-CAPILLARY: 150 mg/dL — AB (ref 65–99)

## 2017-04-09 NOTE — Progress Notes (Signed)
Daily Session Note  Patient Details  Name: Lawrence [name] MRN: [id number] Date of Birth: 1951-07-19 Referring Provider:     Cardiac Rehab from 04/01/2017 in [facility] Cardiac and Pul[facility] Re[facility]  Referring Provider  Josephina Gip MD      Encounter Date: 04/09/2017  Check In:     Session Check In - 04/09/17 1718      Check-In   Location [facility]-Cardiac & Pulmonary Rehab   Staff Present Heath Lark, RN, BSN, Laveda Norman, BS, ACSM CEP, Exercise Physiologist;Kynisha Memon Oletta Darter, [place], ACSM CEP, Exercise Physiologist   Supervising physician immediately available to respond to emergencies See telemetry face sheet for immediately available ER MD   Medication changes reported     No   Fall or balance concerns reported    No   Warm-up and Cool-down Performed on first and last piece of equipment   Resistance Training Performed Yes   VAD Patient? No     Pain Assessment   Currently in Pain? No/denies         History  Smoking Status  . Former Smoker  . Packs/day: 1.00  . Years: 16.00  . Types: Cigarettes  . Start date: 11/03/1969  . Quit date: 07/22/1986  Smokeless Tobacco  . Never Used    Goals Met:  Independence with exercise equipment Exercise tolerated well Strength training completed today  Goals Unmet:  Not Applicable  Comments: See exercise comments.   Dr. Emily Filbert is Medical Director for [facility].

## 2017-04-13 ENCOUNTER — Encounter: Payer: Medicare Other | Admitting: *Deleted

## 2017-04-13 DIAGNOSIS — I2510 Atherosclerotic heart disease of native coronary artery without angina pectoris: Secondary | ICD-10-CM | POA: Diagnosis not present

## 2017-04-13 DIAGNOSIS — Z79899 Other long term (current) drug therapy: Secondary | ICD-10-CM | POA: Diagnosis not present

## 2017-04-13 DIAGNOSIS — Z7982 Long term (current) use of aspirin: Secondary | ICD-10-CM | POA: Diagnosis not present

## 2017-04-13 DIAGNOSIS — I214 Non-ST elevation (NSTEMI) myocardial infarction: Secondary | ICD-10-CM | POA: Diagnosis not present

## 2017-04-13 DIAGNOSIS — Z794 Long term (current) use of insulin: Secondary | ICD-10-CM | POA: Diagnosis not present

## 2017-04-13 DIAGNOSIS — Z951 Presence of aortocoronary bypass graft: Secondary | ICD-10-CM

## 2017-04-13 DIAGNOSIS — Z7902 Long term (current) use of antithrombotics/antiplatelets: Secondary | ICD-10-CM | POA: Diagnosis not present

## 2017-04-13 LAB — GLUCOSE, CAPILLARY
GLUCOSE-CAPILLARY: 179 mg/dL — AB (ref 65–99)
GLUCOSE-CAPILLARY: 181 mg/dL — AB (ref 65–99)

## 2017-04-13 NOTE — Progress Notes (Signed)
Daily Session Note  Patient Details  Name: Lawrence [name] MRN: [id number] Date of Birth: 03-09-1951 Referring Provider:     Cardiac Rehab from 04/01/2017 in [facility] Cardiac and Pulmonary Rehab  Referring Provider  Josephina Gip MD      Encounter Date: 04/13/2017  Check In:     Session Check In - 04/13/17 1726      Check-In   Location [facility]   Staff Present Renita Papa, RN Moises Blood, BS, ACSM CEP, Exercise Physiologist;Susanne Bice, RN, BSN, CCRP   Supervising physician immediately available to respond to emergencies See telemetry face sheet for immediately available ER MD   Medication changes reported     No   Fall or balance concerns reported    No   Warm-up and Cool-down Performed on first and last piece of equipment   Resistance Training Performed Yes   VAD Patient? No     Pain Assessment   Currently in Pain? No/denies   Multiple Pain Sites No         History  Smoking Status  . Former Smoker  . Packs/day: 1.00  . Years: 16.00  . Types: Cigarettes  . Start date: 11/03/1969  . Quit date: 07/22/1986  Smokeless Tobacco  . Never Used    Goals Met:  Independence with exercise equipment Exercise tolerated well No report of cardiac concerns or symptoms Strength training completed today  Goals Unmet:  Not Applicable  Comments: Pt able to follow exercise prescription today without complaint.  Will continue to monitor for progression.    Dr. Emily Filbert is Medical Director for [facility].

## 2017-04-15 ENCOUNTER — Telehealth: Payer: Self-pay

## 2017-04-15 NOTE — Telephone Encounter (Signed)
Lawrence [name] called to say he was not having a good day and will not be at class.

## 2017-04-16 ENCOUNTER — Telehealth: Payer: Self-pay

## 2017-04-16 NOTE — Telephone Encounter (Signed)
Lawrence [name] is not feeling well and will not be at [facility] today.

## 2017-04-20 ENCOUNTER — Telehealth: Payer: Self-pay

## 2017-04-20 NOTE — Telephone Encounter (Signed)
Lawrence [name] called to say he will not be at [facility] until after he gets results from a cath scheduled Tuesday 6/26.  He has spoken with Dr Nehemiah Massed and I reminded him to call 911 if he feels he needs to.  He verbalized understanding and said "they are on speed dial".

## 2017-04-21 DIAGNOSIS — Z01818 Encounter for other preprocedural examination: Secondary | ICD-10-CM | POA: Diagnosis not present

## 2017-04-22 ENCOUNTER — Emergency Department: Payer: Medicare Other

## 2017-04-22 ENCOUNTER — Other Ambulatory Visit: Payer: Self-pay

## 2017-04-22 ENCOUNTER — Encounter: Payer: Self-pay | Admitting: *Deleted

## 2017-04-22 ENCOUNTER — Observation Stay
Admission: EM | Admit: 2017-04-22 | Discharge: 2017-04-23 | Disposition: A | Discharge status: Home / Self Care | Payer: Medicare Other | Attending: Internal Medicine | Admitting: Internal Medicine

## 2017-04-22 DIAGNOSIS — E119 Type 2 diabetes mellitus without complications: Secondary | ICD-10-CM | POA: Insufficient documentation

## 2017-04-22 DIAGNOSIS — I214 Non-ST elevation (NSTEMI) myocardial infarction: Secondary | ICD-10-CM

## 2017-04-22 DIAGNOSIS — G4730 Sleep apnea, unspecified: Secondary | ICD-10-CM | POA: Insufficient documentation

## 2017-04-22 DIAGNOSIS — R0789 Other chest pain: Secondary | ICD-10-CM | POA: Diagnosis not present

## 2017-04-22 DIAGNOSIS — R079 Chest pain, unspecified: Secondary | ICD-10-CM | POA: Diagnosis not present

## 2017-04-22 DIAGNOSIS — I208 Other forms of angina pectoris: Secondary | ICD-10-CM | POA: Diagnosis present

## 2017-04-22 DIAGNOSIS — Z951 Presence of aortocoronary bypass graft: Secondary | ICD-10-CM

## 2017-04-22 DIAGNOSIS — Z87891 Personal history of nicotine dependence: Secondary | ICD-10-CM | POA: Diagnosis not present

## 2017-04-22 DIAGNOSIS — I25110 Atherosclerotic heart disease of native coronary artery with unstable angina pectoris: Secondary | ICD-10-CM | POA: Diagnosis not present

## 2017-04-22 DIAGNOSIS — Z79899 Other long term (current) drug therapy: Secondary | ICD-10-CM | POA: Insufficient documentation

## 2017-04-22 DIAGNOSIS — I200 Unstable angina: Secondary | ICD-10-CM | POA: Diagnosis present

## 2017-04-22 DIAGNOSIS — I10 Essential (primary) hypertension: Secondary | ICD-10-CM | POA: Insufficient documentation

## 2017-04-22 DIAGNOSIS — I2510 Atherosclerotic heart disease of native coronary artery without angina pectoris: Secondary | ICD-10-CM | POA: Diagnosis not present

## 2017-04-22 DIAGNOSIS — R42 Dizziness and giddiness: Secondary | ICD-10-CM | POA: Diagnosis not present

## 2017-04-22 DIAGNOSIS — Z85828 Personal history of other malignant neoplasm of skin: Secondary | ICD-10-CM | POA: Diagnosis not present

## 2017-04-22 DIAGNOSIS — Z7982 Long term (current) use of aspirin: Secondary | ICD-10-CM | POA: Insufficient documentation

## 2017-04-22 DIAGNOSIS — E785 Hyperlipidemia, unspecified: Secondary | ICD-10-CM | POA: Diagnosis not present

## 2017-04-22 LAB — CBC
HCT: 37 % — ABNORMAL LOW (ref 40.0–52.0)
Hemoglobin: 12.7 g/dL — ABNORMAL LOW (ref 13.0–18.0)
MCH: 31.9 pg (ref 26.0–34.0)
MCHC: 34.4 g/dL (ref 32.0–36.0)
MCV: 92.6 fL (ref 80.0–100.0)
PLATELETS: 215 10*3/uL (ref 150–440)
RBC: 3.99 MIL/uL — AB (ref 4.40–5.90)
RDW: 13.4 % (ref 11.5–14.5)
WBC: 9.4 10*3/uL (ref 3.8–10.6)

## 2017-04-22 LAB — BASIC METABOLIC PANEL
Anion gap: 7 (ref 5–15)
BUN: 28 mg/dL — AB (ref 6–20)
CHLORIDE: 102 mmol/L (ref 101–111)
CO2: 28 mmol/L (ref 22–32)
CREATININE: 1 mg/dL (ref 0.61–1.24)
Calcium: 8.9 mg/dL (ref 8.9–10.3)
GFR calc non Af Amer: >60 mL/min (ref >60)
GLUCOSE: 330 mg/dL — AB (ref 65–99)
Potassium: 4 mmol/L (ref 3.5–5.1)
Sodium: 137 mmol/L (ref 135–145)

## 2017-04-22 LAB — TROPONIN I

## 2017-04-22 MED ORDER — ONDANSETRON HCL 4 MG/2ML IJ SOLN: 4.0000 mg | Freq: Once | INTRAMUSCULAR | Status: DC

## 2017-04-22 MED ORDER — NITROGLYCERIN 2 % TD OINT: 1.0000 [in_us] | TOPICAL_OINTMENT | Freq: Once | TRANSDERMAL | Status: DC

## 2017-04-22 NOTE — H&P (Signed)
[facility] -  @ [facility] Admission History and Physical [name], D.O.  ---------------------------------------------------------------------------------------------------------------------   PATIENT NAME: Lawrence [name] MR#: [id number] DATE OF BIRTH: [birth year] DATE OF ADMISSION: 04/22/2017 PRIMARY CARE PHYSICIAN: Doreene Nest, NP  REQUESTING/REFERRING PHYSICIAN: ED Dr. Alphonzo Lemmings  CHIEF COMPLAINT: Chief Complaint  Patient presents with  . Chest Pain    HISTORY OF PRESENT ILLNESS: Lawrence [name] is a 66 y.o. male with a known history of CAD s/p MI, CABG, DM, HLD, HTN, OA, presents to the emergency department for evaluation of chest pain.  Patient was in a usual state of health until This evening when he developed left-sided chest pain described as severe, Substernal, nonradiating, 8 out of 10 which improved to 4/10 after nitroglycerin. He states that he was at rest, sitting on the couch reading when the pain occurred. He states his symptoms are similar to previous angina. Patient denies associated fevers/chills, weakness, dizziness, shortness of breath, N/V/C/D, abdominal pain, dysuria/frequency, changes in mental status.   Patient states that he had a catheterization in March of this year which revealed a 95% blockage as well as occlusion of his coronary artery bypass graft. He was scheduled for repeat catheterization next week for recurrent anginal symptoms. At present his pain is resolved.  Otherwise there has been no change in status. Patient has been taking medication as prescribed and there has been no recent change in medication or diet.  There has been no recent illness, travel or sick contacts.    EMS/ED COURSE:   Patient had already taken a full dose aspirin today. At the onset of symptoms he took 3 nitroglycerin at home and had one nitroglycerin spray in the ambulance which relieved symptoms. Medical admission was requested for ongoing management of unstable  angina.[name]  PAST MEDICAL HISTORY: Past Medical History:  Diagnosis Date  . CAD (coronary artery disease)    07/2007  . Diabetes (HCC)   . Elevated lipids   . Glaucoma   . HBP (high blood pressure)   . Neuropathy   . Osteoarthritis   . Skin cancer   . Sleep apnea    CPAP      PAST SURGICAL HISTORY: Past Surgical History:  Procedure Laterality Date  . APPENDECTOMY    . CARDIAC CATHETERIZATION N/A 06/11/2016   Procedure: Left Heart Cath and Coronary Angiography;  Surgeon: Lamar Blinks, MD;  Location: [facility] INVASIVE CV LAB;  Service: Cardiovascular;  Laterality: N/A;  . CARPAL TUNNEL RELEASE Left 04/24/2016   Procedure: CARPAL TUNNEL RELEASE;  Surgeon: Kennedy Bucker, MD;  Location: [facility] ORS;  Service: Orthopedics;  Laterality: Left;  . CATARACT EXTRACTION W/ INTRAOCULAR LENS  IMPLANT, BILATERAL Bilateral   . CATARACT EXTRACTION, BILATERAL    . CORONARY ARTERY BYPASS GRAFT N/A 06/27/2016   Procedure: CORONARY ARTERY BYPASS GRAFTING times four using left internal mammary artery and right leg saphenous vein;  Surgeon: Kerin Perna, MD;  Location: [facility] OR;  Service: Open Heart Surgery;  Laterality: N/A;  . EYE SURGERY Bilateral   . JOINT REPLACEMENT    . KNEE ARTHROPLASTY Left 02/11/2016   Procedure: COMPUTER ASSISTED TOTAL KNEE ARTHROPLASTY;  Surgeon: Donato Heinz, MD;  Location: [facility] ORS;  Service: Orthopedics;  Laterality: Left;  . KNEE ARTHROSCOPY Right   . LEFT HEART CATH AND CORS/GRAFTS ANGIOGRAPHY Left 01/13/2017   Procedure: Left Heart Cath and Cors/Grafts Angiography;  Surgeon: Lamar Blinks, MD;  Location: [facility] INVASIVE CV LAB;  Service: Cardiovascular;  Laterality: Left;  . TEE WITHOUT  CARDIOVERSION N/A 06/27/2016   Procedure: TRANSESOPHAGEAL ECHOCARDIOGRAM (TEE);  Surgeon: Kerin Perna, MD;  Location: [facility] OR;  Service: Open Heart Surgery;  Laterality: N/A;      SOCIAL HISTORY: Social History  Substance Use Topics  . Smoking status: Former Smoker    Packs/day: 1.00     Years: 16.00    Types: Cigarettes    Start date: 11/03/1969    Quit date: 07/22/1986  . Smokeless tobacco: Never Used  . Alcohol use 0.0 oz/week     Comment: OCCASIONALLY      FAMILY HISTORY: Family History  Problem Relation Age of Onset  . Hypertension Mother   . Lupus Mother   . Hypertension Father   . Diabetes Father   . Heart attack Father 23       died in his 75s  . Arthritis Maternal Grandmother   . Arthritis Maternal Grandfather   . Arthritis Paternal Grandmother   . Arthritis Paternal Grandfather      MEDICATIONS AT HOME: Prior to Admission medications   Medication Sig Start Date End Date Taking? Authorizing Provider  aspirin 81 MG EC tablet Take 81 mg by mouth daily. Swallow whole.   Yes [provider]  cholecalciferol (VITAMIN D) 1000 units tablet Take 1,000 Units by mouth daily.   Yes [provider]  clopidogrel (PLAVIX) 75 MG tablet Take 1 tablet (75 mg total) by mouth daily. 03/20/17  Yes Delfino Lovett, MD  docusate sodium (COLACE) 100 MG capsule Take 200 mg by mouth 2 (two) times daily.   Yes [provider]  fexofenadine (ALLEGRA) 180 MG tablet Take 180 mg by mouth daily.   Yes [provider]  furosemide (LASIX) 40 MG tablet Take 1 tablet (40 mg total) by mouth daily. 07/03/16  Yes Gold, [name] E, PA-C  glucagon (GLUCAGON EMERGENCY) 1 MG injection Inject 1 mg into the vein once as needed. Reported on 02/11/2016   Yes [provider]  insulin aspart (NOVOLOG) 100 UNIT/ML injection Inject 20 Units into the skin 3 (three) times daily. Patient taking differently: Inject 20 Units into the skin 3 (three) times daily with meals.  04/07/16  Yes Doreene Nest, NP  insulin glargine (LANTUS) 100 UNIT/ML injection Inject 0.24 mLs (24 Units total) into the skin at bedtime. 03/26/17  Yes Doreene Nest, NP  isosorbide mononitrate (IMDUR) 30 MG 24 hr tablet Take 30 mg by mouth daily.   Yes [provider]  latanoprost  (XALATAN) 0.005 % ophthalmic solution Place 1 drop into both eyes at bedtime.   Yes [provider]  metoprolol tartrate (LOPRESSOR) 25 MG tablet Take 25 mg by mouth 2 (two) times daily.   Yes [provider]  Multiple Vitamin (MULTIVITAMIN) capsule Take 1 capsule by mouth daily.   Yes [provider]  nitroGLYCERIN (NITROSTAT) 0.4 MG SL tablet Place 1 tablet (0.4 mg total) under the tongue every 5 (five) minutes as needed for chest pain. 03/26/17  Yes Doreene Nest, NP  potassium chloride SA (K-DUR,KLOR-CON) 20 MEQ tablet Take 1 tablet (20 mEq total) by mouth daily. 07/03/16  Yes Gold, [name] E, PA-C  rosuvastatin (CRESTOR) 20 MG tablet Take 1 tablet (20 mg total) by mouth daily at 6 PM. 07/03/16  Yes Gold, Deniece Portela E, PA-C  vitamin C (ASCORBIC ACID) 500 MG tablet Take 500 mg by mouth 2 (two) times daily.    Yes [provider]      DRUG ALLERGIES: Allergies  Allergen Reactions  .  Nitroglycerin Nausea Only and Other (See Comments)    Patches only - headache  . Statins Other (See Comments)    BODY PAIN   . Lipitor [Atorvastatin] Other (See Comments) and Rash    BODY PAIN  . Lisinopril Other (See Comments)    Light headed, dizzy     REVIEW OF SYSTEMS: CONSTITUTIONAL: No fatigue, weakness, fever, chills, weight gain/loss, headache EYES: No blurry or double vision. ENT: No tinnitus, postnasal drip, redness or soreness of the oropharynx. RESPIRATORY: No dyspnea, cough, wheeze, hemoptysis. CARDIOVASCULAR: Positive chest pain, negative orthopnea, palpitations, syncope. GASTROINTESTINAL: No nausea, vomiting, constipation, diarrhea, abdominal pain. No hematemesis, melena or hematochezia. GENITOURINARY: No dysuria, frequency, hematuria. ENDOCRINE: No polyuria or nocturia. No heat or cold intolerance. HEMATOLOGY: No anemia, bruising, bleeding. INTEGUMENTARY: No rashes, ulcers, lesions. MUSCULOSKELETAL: No pain, arthritis, swelling, gout. NEUROLOGIC: No  numbness, tingling, weakness or ataxia. No seizure-type activity. PSYCHIATRIC: No anxiety, depression, insomnia.  PHYSICAL EXAMINATION: VITAL SIGNS: Blood pressure (!) 103/53, pulse 72, temperature 98.4 F (36.9 C), temperature source Oral, resp. rate 16, height 5\' 10"  (1.778 m), weight 131.5 kg (289 lb 12.8 oz), SpO2 93 %.  GENERAL: 66 y.o.-year-old Male patient, well-developed, well-nourished lying in the bed in no acute distress.  Pleasant and cooperative.   HEENT: Head atraumatic, normocephalic. Pupils equal, round, reactive to light and accommodation. No scleral icterus. Extraocular muscles intact. Oropharynx is clear. Mucus membranes moist. NECK: Supple, full range of motion. No JVD, no bruit heard. No cervical lymphadenopathy. CHEST: Normal breath sounds bilaterally. No wheezing, rales, rhonchi or crackles. No use of accessory muscles of respiration.  No reproducible chest wall tenderness.  CARDIOVASCULAR: S1, S2 normal. No murmurs, rubs, or gallops appreciated. Cap refill <2 seconds. ABDOMEN: Soft, nontender, nondistended. No rebound, guarding, rigidity. Normoactive bowel sounds present in all four quadrants. No organomegaly or mass. EXTREMITIES: Positive bilateral lower extremity edema which is chronic. Compression stockings in place. Full range of motion. No pedal edema, cyanosis, or clubbing. NEUROLOGIC: Cranial nerves II through XII are grossly intact with no focal sensorimotor deficit. Muscle strength 5/5 in all extremities. Sensation intact. Gait not checked. PSYCHIATRIC: The patient is alert and oriented x 3. Normal affect, mood, thought content. SKIN: Warm, dry, and intact without obvious rash, lesion, or ulcer.  LABORATORY PANEL:  CBC  Recent Labs Lab 04/22/17 2109  WBC 9.4  HGB 12.7*  HCT 37.0*  PLT 215   ----------------------------------------------------------------------------------------------------------------- Chemistries  Recent Labs Lab 04/22/17 2109   NA 137  K 4.0  CL 102  CO2 28  GLUCOSE 330*  BUN 28*  CREATININE 1.00  CALCIUM 8.9   ------------------------------------------------------------------------------------------------------------------ Cardiac Enzymes  Recent Labs Lab 04/22/17 2109  TROPONINI <0.03   ------------------------------------------------------------------------------------------------------------------  RADIOLOGY: Dg Chest 2 View  Result Date: 04/22/2017 CLINICAL DATA:  66 year old male with left-sided chest pain EXAM: CHEST  2 VIEW COMPARISON:  Prior chest x-ray 03/17/2017 FINDINGS: The lungs are clear and negative for focal airspace consolidation, pulmonary edema or suspicious pulmonary nodule. No pleural effusion or pneumothorax. Cardiac and mediastinal contours are within normal limits. Patient is status post median sternotomy with evidence of prior multivessel CABG including LIMA bypass. No acute fracture or lytic or blastic osseous lesions. The visualized upper abdominal bowel gas pattern is unremarkable. IMPRESSION: No active cardiopulmonary disease. Electronically Signed   By: Malachy Moan M.D.   On: 04/22/2017 21:33    EKG: Normal sinus rhythm at 81 bpm with leftward axis and nonspecific ST-T wave changes.   IMPRESSION AND PLAN:  This  is a 66 y.o. male with a history of CAD s/p MI, CABG, DM, HLD, HTN, OA, now being admitted with:  1. Unstable angina - Admit to inpatient with telemetry monitoring. - Trend troponins, check lipids and TSH. -Nothing by mouth after midnight for possible catheterization in a.m. - Morphine, nitro, beta blocker, aspirin and statin ordered.   - Cardiology consult requested.  No heparin drip per cardio.  2. History of coronary artery disease -Continue Imdur, Plavix, aspirin, Crestor  3. History diabetes -Continue Lantus at half dose secondary to nothing by mouth status -Accu-Cheks every 4 hours with regular insulin sliding scale coverage Nothing by mouth  after midnight  4. History of hypertension -Lasix, potassium, metoprolol  5. History of hyperlipidemia -Continue Crestor  Admission status: Inpatinet telemetry Diet/Nutrition: Heart healthy, NPO after midnight Fluids: HL DVT Px: Heparin, SCDs and early ambulation Code Status: Full Disposition Plan: To home in 1-2 days hours  All the records are reviewed and case discussed with ED provider. Management plans discussed with the patient and/or family who express understanding and agree with plan of care.   TOTAL TIME TAKING CARE OF THIS PATIENT: 60 minutes.   Dominique Calvey D.O. on 04/22/2017 at 11:02 PM Between 7am to 6pm - Pager - [phone] After 6pm go to [email or web address] - Social research officer, government [facility] [place] [facility] Office [phone] CC: Primary care physician; Doreene Nest, NP     Note: This dictation was prepared with Dragon dictation along with smaller phrase technology. Any transcriptional errors that result from this process are unintentional.

## 2017-04-22 NOTE — ED Provider Notes (Signed)
[facility] Emergency Department Provider Note  ____________________________________________   I have reviewed the triage vital signs and the nursing notes.   HISTORY  Chief Complaint Chest Pain    HPI Lawrence [name] is a 66 y.o. male with a history of CAD and angina, has been having angina symptom for some time, has been getting worse. He is scheduled for an outpatient cardiac catheterization next week. He states that he has had to use nitroglycerin in the past. This afternoon however he had severe substernal chest pain nonradiating pill, no shows breath nonpleuritic, no change in his chronic lower extremity edema. The pain lasted for over an hour., he states he is pain-free at this time. He states thathe has had this exact pain level times and this is his angina.  He has had a full aspirin today. Denies nausea vomiting. He states he may have been diaphoretic. He was mildly dizzy but he is no longer.     Past Medical History:  Diagnosis Date  . CAD (coronary artery disease)    07/2007  . Diabetes ([place])   . Elevated lipids   . Glaucoma   . HBP (high blood pressure)   . Neuropathy   . Osteoarthritis   . Skin cancer   . Sleep apnea    CPAP    Patient Active Problem List   Diagnosis Date Noted  . Stable angina ([place]) 04/22/2017  . Chest pain, rule out acute myocardial infarction 03/17/2017  . S/P CABG (coronary artery bypass graft) 06/27/2016  . Unstable angina ([place]) 06/24/2016  . Controlled type 1 diabetes mellitus without complication ([place]) 72/53/6644  . Combined fat and carbohydrate induced hyperlipemia 04/04/2016  . Myocardial infarction ([place]) 04/04/2016  . H/O total knee replacement 03/28/2016  . S/P total knee arthroplasty 02/11/2016  . Morbid (severe) obesity due to excess calories ([place]) 11/05/2015  . Arthritis of knee, degenerative 11/05/2015  . Hyperlipidemia 08/28/2015  . Type 1 diabetes mellitus ([place]) 08/28/2015  . CAD (coronary artery  disease) 08/28/2015  . Lower extremity edema 08/28/2015  . GERD (gastroesophageal reflux disease) 08/28/2015  . OSA (obstructive sleep apnea) 08/28/2015  . Morbid obesity with BMI of 40.0-44.9, adult ([place]) 08/28/2015  . Benign essential HTN 05/18/2015    Past Surgical History:  Procedure Laterality Date  . APPENDECTOMY    . CARDIAC CATHETERIZATION N/A 06/11/2016   Procedure: Left Heart Cath and Coronary Angiography;  Surgeon: Corey Skains, MD;  Location: [place] [facility];  Service: Cardiovascular;  Laterality: N/A;  . CARPAL TUNNEL RELEASE Left 04/24/2016   Procedure: CARPAL TUNNEL RELEASE;  Surgeon: Hessie Knows, MD;  Location: [facility] OR[facility];  Service: Orthopedics;  Laterality: Left;  . CATARACT EXTRACTION W/ INTRAOCULAR LENS  IMPLANT, BILATERAL Bilateral   . CATARACT EXTRACTION, BILATERAL    . CORONARY ARTERY BYPASS GRAFT N/A 06/27/2016   Procedure: CORONARY ARTERY BYPASS GRAFTING times four using left internal mammary artery and right leg saphenous vein;  Surgeon: Ivin Poot, MD;  Location: [place];  Service: Open Heart Surgery;  Laterality: N/A;  . EYE SURGERY Bilateral   . JOINT REPLACEMENT    . KNEE ARTHROPLASTY Left 02/11/2016   Procedure: COMPUTER ASSISTED TOTAL KNEE ARTHROPLASTY;  Surgeon: Dereck Leep, MD;  Location: [facility] OR[facility];  Service: Orthopedics;  Laterality: Left;  . KNEE ARTHROSCOPY Right   . LEFT HEART CATH AND CORS/GRAFTS ANGIOGRAPHY Left 01/13/2017   Procedure: Left Heart Cath and Cors/Grafts Angiography;  Surgeon: Corey Skains, MD;  Location: [facility]  INVASIVE CV LAB;  Service: Cardiovascular;  Laterality: Left;  . TEE WITHOUT CARDIOVERSION N/A 06/27/2016   Procedure: TRANSESOPHAGEAL ECHOCARDIOGRAM (TEE);  Surgeon: Ivin Poot, MD;  Location: [place];  Service: Open Heart Surgery;  Laterality: N/A;    Prior to Admission medications   Medication Sig Start Date End Date Taking? Authorizing Provider  aspirin 81 MG EC tablet Take 81 mg by mouth daily. Swallow whole.    Yes [provider]  cholecalciferol (VITAMIN D) 1000 units tablet Take 1,000 Units by mouth daily.   Yes [provider]  clopidogrel (PLAVIX) 75 MG tablet Take 1 tablet (75 mg total) by mouth daily. 03/20/17  Yes Max Sane, MD  docusate sodium (COLACE) 100 MG capsule Take 200 mg by mouth 2 (two) times daily.   Yes [provider]  fexofenadine (ALLEGRA) 180 MG tablet Take 180 mg by mouth daily.   Yes [provider]  furosemide (LASIX) 40 MG tablet Take 1 tablet (40 mg total) by mouth daily. 07/03/16  Yes Gold, [name] E, PA-C  glucagon (GLUCAGON EMERGENCY) 1 MG injection Inject 1 mg into the vein once as needed. Reported on 02/11/2016   Yes [provider]  insulin aspart (NOVOLOG) 100 UNIT/ML injection Inject 20 Units into the skin 3 (three) times daily. Patient taking differently: Inject 20 Units into the skin 3 (three) times daily with meals.  04/07/16  Yes Pleas Koch, NP  insulin glargine (LANTUS) 100 UNIT/ML injection Inject 0.24 mLs (24 Units total) into the skin at bedtime. 03/26/17  Yes Pleas Koch, NP  isosorbide mononitrate (IMDUR) 30 MG 24 hr tablet Take 30 mg by mouth daily.   Yes [provider]  latanoprost (XALATAN) 0.005 % ophthalmic solution Place 1 drop into both eyes at bedtime.   Yes [provider]  metoprolol tartrate (LOPRESSOR) 25 MG tablet Take 25 mg by mouth 2 (two) times daily.   Yes [provider]  Multiple Vitamin (MULTIVITAMIN) capsule Take 1 capsule by mouth daily.   Yes [provider]  nitroGLYCERIN (NITROSTAT) 0.4 MG SL tablet Place 1 tablet (0.4 mg total) under the tongue every 5 (five) minutes as needed for chest pain. 03/26/17  Yes Pleas Koch, NP  potassium chloride SA (K-DUR,KLOR-CON) 20 MEQ tablet Take 1 tablet (20 mEq total) by mouth daily. 07/03/16  Yes Gold, [name] E, PA-C  rosuvastatin (CRESTOR) 20 MG tablet Take 1 tablet (20 mg total) by mouth daily at 6 PM.  07/03/16  Yes Gold, [name] E, PA-C  vitamin C (ASCORBIC ACID) 500 MG tablet Take 500 mg by mouth 2 (two) times daily.    Yes [provider]    Allergies Nitroglycerin; Statins; Lipitor [atorvastatin]; and Lisinopril  Family History  Problem Relation Age of Onset  . Hypertension Mother   . Lupus Mother   . Hypertension Father   . Diabetes Father   . Heart attack Father 47       died in his 57s  . Arthritis Maternal Grandmother   . Arthritis Maternal Grandfather   . Arthritis Paternal Grandmother   . Arthritis Paternal Grandfather     Social History Social History  Substance Use Topics  . Smoking status: Former Smoker    Packs/day: 1.00    Years: 16.00    Types: Cigarettes    Start date: 11/03/1969    Quit date: 07/22/1986  . Smokeless tobacco: Never Used  . Alcohol use 0.0 oz/week     Comment: OCCASIONALLY  Review of Systems Constitutional: No fever/chills Eyes: No visual changes. ENT: No sore throat. No stiff neck no neck pain Cardiovascular: Positive chest pain. Respiratory: Denies shortness of breath. Gastrointestinal:   no vomiting.  No diarrhea.  No constipation. Genitourinary: Negative for dysuria. Musculoskeletal: Negative for change in lower extremity swelling Skin: Negative for rash. Neurological: Negative for severe headaches, focal weakness or numbness.   ____________________________________________   PHYSICAL EXAM:  VITAL SIGNS: ED Triage Vitals  Enc Vitals Group     BP 04/22/17 2114 (!) 118/47     Pulse Rate 04/22/17 2114 82     Resp 04/22/17 2114 (!) 21     Temp 04/22/17 2114 98.4 F (36.9 C)     Temp Source 04/22/17 2114 Oral     SpO2 04/22/17 2114 98 %     Weight 04/22/17 2106 289 lb 12.8 oz (131.5 kg)     Height 04/22/17 2106 5\' 10"  (1.778 m)     Head Circumference --      Peak Flow --      Pain Score 04/22/17 2106 1     Pain Loc --      Pain Edu? --      Excl. in [place]? --     Constitutional: Alert and oriented. Well  appearing and in no acute distress. Eyes: Conjunctivae are normal Head: Atraumatic HEENT: No congestion/rhinnorhea. Mucous membranes are moist.  Oropharynx non-erythematous Neck:   Nontender with no meningismus, no masses, no stridor Cardiovascular: Normal rate, regular rhythm. Grossly normal heart sounds.  Good peripheral circulation. Respiratory: Normal respiratory effort.  No retractions. Lungs CTAB. Abdominal: Soft and nontender. No distention. No guarding no rebound Back:  There is no focal tenderness or step off.  there is no midline tenderness there are no lesions noted. there is no CVA tenderness Musculoskeletal: No lower extremity tenderness, no upper extremity tenderness. No joint effusions, no DVT signs strong distal pulses Chronic positive symmetric bilateral 2+ edema Neurologic:  Normal speech and language. No gross focal neurologic deficits are appreciated.  Skin:  Skin is warm, dry and intact. No rash noted. Psychiatric: Mood and affect are normal. Speech and behavior are normal.  ____________________________________________   LABS (all labs ordered are listed, but only abnormal results are displayed)  Labs Reviewed  BASIC METABOLIC PANEL - Abnormal; Notable for the following:       Result Value   Glucose, Bld 330 (*)    BUN 28 (*)    All other components within normal limits  CBC - Abnormal; Notable for the following:    RBC 3.99 (*)    Hemoglobin 12.7 (*)    HCT 37.0 (*)    All other components within normal limits  TROPONIN I   ____________________________________________  EKG  I personally interpreted any EKGs ordered by me or triage Sinus rhythm at 81 bpm no acute ST elevation or depression, borderline LAD, no acute ischemic changes, ____________________________________________  RADIOLOGY  I reviewed any imaging ordered by me or triage that were performed during my shift and, if possible, patient and/or family made aware of any abnormal  findings. ____________________________________________   PROCEDURES  Procedure(s) performed: None  Procedures  Critical Care performed: None  ____________________________________________   INITIAL IMPRESSION / ASSESSMENT AND PLAN / ED COURSE  Pertinent labs & imaging results that were available during my care of the patient were reviewed by me and considered in my medical decision making (see chart for details).  Patient here with chest pain which is now  resolved, he has had aspirin he refuses nitro paste. Discussed with Dr. Bernita Buffy who does not wish a heparin drip, agrees with management and admission. Patient remains pain-free at this time. Signed out to the hospitalist service.    ____________________________________________   FINAL CLINICAL IMPRESSION(S) / ED DIAGNOSES  Final diagnoses:  Chest pain, unspecified type      This chart was dictated using voice recognition software.  Despite best efforts to proofread,  errors can occur which can change meaning.      Schuyler Amor, MD 04/22/17 2308

## 2017-04-22 NOTE — Progress Notes (Signed)
Cardiac Individual Treatment Plan  Patient Details  Name: BENJAMAN ARTMAN MRN: [id number] Date of Birth: [birth year] Referring Provider:     Cardiac Rehab from 04/01/2017 in [facility] Cardiac and Pulmonary Rehab  Referring Provider  Josephina Gip MD      Initial Encounter Date:    Cardiac Rehab from 04/01/2017 in [facility] Cardiac and Pulmonary Rehab  Date  04/01/17  Referring Provider  Josephina Gip MD      Visit Diagnosis: NSTEMI (non-ST elevated myocardial infarction) ([place])  S/P CABG x 4  Patient's Home Medications on Admission:  Current Outpatient Prescriptions:  .  aspirin 81 MG EC tablet, Take 81 mg by mouth daily. Swallow whole., Disp: , Rfl:  .  cholecalciferol (VITAMIN D) 1000 units tablet, Take 1,000 Units by mouth daily., Disp: , Rfl:  .  clopidogrel (PLAVIX) 75 MG tablet, Take 1 tablet (75 mg total) by mouth daily., Disp: 30 tablet, Rfl: 0 .  docusate sodium (COLACE) 100 MG capsule, Take 200 mg by mouth 2 (two) times daily., Disp: , Rfl:  .  fexofenadine (ALLEGRA) 180 MG tablet, Take 180 mg by mouth daily., Disp: , Rfl:  .  furosemide (LASIX) 40 MG tablet, Take 1 tablet (40 mg total) by mouth daily., Disp: 30 tablet, Rfl: 1 .  glucagon (GLUCAGON EMERGENCY) 1 MG injection, Inject 1 mg into the vein once as needed. Reported on 02/11/2016, Disp: , Rfl:  .  insulin aspart (NOVOLOG) 100 UNIT/ML injection, Inject 20 Units into the skin 3 (three) times daily. (Patient taking differently: Inject 20 Units into the skin 3 (three) times daily with meals. ), Disp: 10 mL, Rfl: 5 .  insulin glargine (LANTUS) 100 UNIT/ML injection, Inject 0.24 mLs (24 Units total) into the skin at bedtime., Disp: 10 mL, Rfl: 5 .  latanoprost (XALATAN) 0.005 % ophthalmic solution, Place 1 drop into both eyes at bedtime., Disp: , Rfl:  .  metoprolol tartrate (LOPRESSOR) 25 MG tablet, Take 25 mg by mouth 2 (two) times daily., Disp: , Rfl:  .  Multiple Vitamin (MULTIVITAMIN) capsule, Take 1 capsule by mouth daily.,  Disp: , Rfl:  .  nitroGLYCERIN (NITROSTAT) 0.4 MG SL tablet, Place 1 tablet (0.4 mg total) under the tongue every 5 (five) minutes as needed for chest pain., Disp: 50 tablet, Rfl: 1 .  potassium chloride SA (K-DUR,KLOR-CON) 20 MEQ tablet, Take 1 tablet (20 mEq total) by mouth daily., Disp: 30 tablet, Rfl: 1 .  rosuvastatin (CRESTOR) 20 MG tablet, Take 1 tablet (20 mg total) by mouth daily at 6 PM., Disp: 30 tablet, Rfl: 1 .  vitamin C (ASCORBIC ACID) 500 MG tablet, Take 500 mg by mouth 2 (two) times daily. , Disp: , Rfl:   Past Medical History: Past Medical History:  Diagnosis Date  . CAD (coronary artery disease)    07/2007  . Diabetes ([place])   . Elevated lipids   . Glaucoma   . HBP (high blood pressure)   . Neuropathy   . Osteoarthritis   . Skin cancer   . Sleep apnea    CPAP    Tobacco Use: History  Smoking Status  . Former Smoker  . Packs/day: 1.00  . Years: 16.00  . Types: Cigarettes  . Start date: 11/03/1969  . Quit date: 07/22/1986  Smokeless Tobacco  . Never Used    Labs: Recent Review Flowsheet Data    Labs for ITP Cardiac and Pulmonary Rehab Latest Ref Rng & Units 06/27/2016 06/27/2016 06/28/2016 09/08/2016 03/18/2017   Cholestrol  0 - 200 mg/dL - - - 164 143   LDLCALC 0 - 99 mg/dL - - - 69 63   HDL >40 mg/dL - - - 81.00 67   Trlycerides <150 mg/dL - - - 69.0 63   Hemoglobin A1c 4.8 - 5.6 % - - - 7.9(H) 7.9(H)   PHART 7.350 - 7.450 - 7.390 - - -   PCO2ART 35.0 - 45.0 mmHg - 45.2(H) - - -   HCO3 20.0 - 24.0 mEq/L - 27.3(H) - - -   TCO2 0 - 100 mmol/L _0 - -   O2SAT % - 96.0 - - -       Exercise Target Goals:    Exercise Program Goal: Individual exercise prescription set with THRR, safety & activity barriers. Participant demonstrates ability to understand and report RPE using BORG scale, to self-measure pulse accurately, and to acknowledge the importance of the exercise prescription.  Exercise Prescription Goal: Starting with aerobic activity 30 plus  minutes a day, 3 days per week for initial exercise prescription. Provide home exercise prescription and guidelines that participant acknowledges understanding prior to discharge.  Activity Barriers & Risk Stratification:     Activity Barriers & Cardiac Risk Stratification - 04/01/17 1644      Activity Barriers & Cardiac Risk Stratification   Activity Barriers Arthritis;Joint Problems;Muscular Weakness;Balance Concerns   Cardiac Risk Stratification High      6 Minute Walk:     6 Minute Walk    Row Name 04/01/17 1509         6 Minute Walk   Phase Initial     Distance 1200 feet     Walk Time 6 minutes     # of Rest Breaks 0     MPH 2.27     METS 2.22     RPE 12     VO2 Peak 7.76     Symptoms No     Resting HR 82 bpm     Resting BP 124/70     Max Ex. HR 110 bpm     Max Ex. BP 152/64        Oxygen Initial Assessment:   Oxygen Re-Evaluation:   Oxygen Discharge (Final Oxygen Re-Evaluation):   Initial Exercise Prescription:     Initial Exercise Prescription - 04/01/17 1500      Date of Initial Exercise RX and Referring Provider   Date 04/01/17   Referring Provider Josephina Gip MD     Treadmill   MPH 1.7   Grade 0.5   Minutes 15   METs 2.42     Recumbant Elliptical   Level 1   RPM 50   Minutes 15   METs 2     Biostep-RELP   Level 1   SPM 50   Minutes 15   METs 2     Prescription Details   Frequency (times per week) 3   Duration Progress to 45 minutes of aerobic exercise without signs/symptoms of physical distress     Intensity   THRR 40-80% of Max Heartrate 110-138   Ratings of Perceived Exertion 11-13   Perceived Dyspnea 0-4     Progression   Progression Continue to progress workloads to maintain intensity without signs/symptoms of physical distress.     Resistance Training   Training Prescription Yes   Weight 3 lbs   Reps 10-15      Perform Capillary Blood Glucose checks as needed.  Exercise Prescription Changes:  Exercise Prescription Changes    Row Name 04/01/17 1500 04/15/17 1500           Response to Exercise   Blood Pressure (Admit) 124/70 140/64      Blood Pressure (Exercise) 152/64 156/74      Blood Pressure (Exit)  - 100/70      Heart Rate (Admit) 82 bpm 107 bpm      Heart Rate (Exercise) 110 bpm 108 bpm      Heart Rate (Exit)  - 76 bpm      Oxygen Saturation (Admit) 97 %  -      Oxygen Saturation (Exercise) 97 %  -      Rating of Perceived Exertion (Exercise) 12 16      Symptoms none  -      Comments walk test results  -      Duration  - Progress to 45 minutes of aerobic exercise without signs/symptoms of physical distress      Intensity  - THRR unchanged        Resistance Training   Training Prescription  - Yes      Weight  - 3      Reps  - 10-15        Treadmill   MPH  - 1.7      Grade  - 0.5      Minutes  - 15      METs  - 2.42        Biostep-RELP   Level  - 1      SPM  - 50      Minutes  - 15         Exercise Comments:     Exercise Comments    Row Name 04/06/17 1754 04/09/17 1719 04/15/17 1531       Exercise Comments First full day of exercise!  [name] was oriented to gym and equipment including functions, settings, policies, and procedures.  [name]'s individual exercise prescription and treatment plan were reviewed.  All starting workloads were established based on the results of the 6 minute walk test done at initial orientation visit.  The plan for exercise progression was also introduced and progression will be customized based on patient's performance and goals. Cecil had chest pain during exercise.  It resolved with rest and he completed the session with no other issues. [name] tolerated exercise well last session.  Staff will continue to monitor progress.        Exercise Goals and Review:     Exercise Goals    Row Name 04/01/17 1513             Exercise Goals   Increase Physical Activity Yes       Intervention Provide advice, education, support  and counseling about physical activity/exercise needs.;Develop an individualized exercise prescription for aerobic and resistive training based on initial evaluation findings, risk stratification, comorbidities and participant's personal goals.       Expected Outcomes Achievement of increased cardiorespiratory fitness and enhanced flexibility, muscular endurance and strength shown through measurements of functional capacity and personal statement of participant.       Increase Strength and Stamina Yes       Intervention Provide advice, education, support and counseling about physical activity/exercise needs.;Develop an individualized exercise prescription for aerobic and resistive training based on initial evaluation findings, risk stratification, comorbidities and participant's personal goals.       Expected Outcomes Achievement of increased cardiorespiratory fitness and enhanced flexibility, muscular  endurance and strength shown through measurements of functional capacity and personal statement of participant.          Exercise Goals Re-Evaluation :   Discharge Exercise Prescription (Final Exercise Prescription Changes):     Exercise Prescription Changes - 04/15/17 1500      Response to Exercise   Blood Pressure (Admit) 140/64   Blood Pressure (Exercise) 156/74   Blood Pressure (Exit) 100/70   Heart Rate (Admit) 107 bpm   Heart Rate (Exercise) 108 bpm   Heart Rate (Exit) 76 bpm   Rating of Perceived Exertion (Exercise) 16   Duration Progress to 45 minutes of aerobic exercise without signs/symptoms of physical distress   Intensity THRR unchanged     Resistance Training   Training Prescription Yes   Weight 3   Reps 10-15     Treadmill   MPH 1.7   Grade 0.5   Minutes 15   METs 2.42     Biostep-RELP   Level 1   SPM 50   Minutes 15      Nutrition:  Target Goals: Understanding of nutrition guidelines, daily intake of sodium <1548m, cholesterol <2030m calories 30% from fat  and 7% or less from saturated fats, daily to have 5 or more servings of fruits and vegetables.  Biometrics:     Pre Biometrics - 04/01/17 1513      Pre Biometrics   Height 5' 10.8" (1.798 m)   Weight (!)  300 lb 1.6 oz (136.1 kg)   Waist Circumference 50.25 inches   Hip Circumference 54.5 inches   Waist to Hip Ratio 0.92 %   BMI (Calculated) 42.2   Single Leg Stand 1.43 seconds       Nutrition Therapy Plan and Nutrition Goals:     Nutrition Therapy & Goals - 04/01/17 1644      Nutrition Therapy   RD appointment defered Yes   Drug/Food Interactions Statins/Certain Fruits      Nutrition Discharge: Rate Your Plate Scores:     Nutrition Assessments - 04/01/17 1639      MEDFICTS Scores   Pre Score 67      Nutrition Goals Re-Evaluation:   Nutrition Goals Discharge (Final Nutrition Goals Re-Evaluation):   Psychosocial: Target Goals: Acknowledge presence or absence of significant depression and/or stress, maximize coping skills, provide positive support system. Participant is able to verbalize types and ability to use techniques and skills needed for reducing stress and depression.   Initial Review & Psychosocial Screening:     Initial Psych Review & Screening - 04/01/17 1646      Initial Review   Current issues with Current Sleep Concerns     Family Dynamics   Good Support System? Yes     Barriers   Psychosocial barriers to participate in program The patient should benefit from training in stress management and relaxation.     Screening Interventions   Interventions Encouraged to exercise      Quality of Life Scores:      Quality of Life - 04/01/17 1629      Quality of Life Scores   Health/Function Pre 11.2 %   Socioeconomic Pre 22.75 %   Psych/Spiritual Pre 19.5 %   Family Pre 25.5 %   GLOBAL Pre 16.97 %      PHQ-9: Recent Review Flowsheet Data    [facility] 04/01/2017 12/12/2016 09/11/2016 09/05/2016   Decreased Interest 1 1 0  0   Down, Depressed, Hopeless _0 0  PHQ - 2 Score _0 0   Altered sleeping _1 -   Tired, decreased energy _2 -   Change in appetite 2 1 0 -   Feeling bad or failure about yourself  _3 -   Trouble concentrating _4 -   Moving slowly or fidgety/restless _5 -   Suicidal thoughts 0 0 0 -   PHQ-9 Score _6 -   Difficult doing work/chores Somewhat difficult Somewhat difficult Somewhat difficult -     Interpretation of Total Score  Total Score Depression Severity:  1-4 = Minimal depression, 5-9 = Mild depression, 10-14 = Moderate depression, 15-19 = Moderately severe depression, 20-27 = Severe depression   Psychosocial Evaluation and Intervention:     Psychosocial Evaluation - 04/06/17 1748      Psychosocial Evaluation & Interventions   Interventions Stress management education;Relaxation education;Encouraged to exercise with the program and follow exercise prescription   Comments Counselor met with Mr. Farrel [facility]) today for return to this Cardiac Rehab program subsequent to another heart attack several weeks ago.  He continues to have a strong support system with his spouse; and some wonderful neighbors who are like family to him.  He also is actively involved in his local church.  [name] has multiple health issues with Diabetes; shoulder and hip pain; and had quadruple bypass in August 2017 with multiple current blockages reported where that procedure was done.  [name] also had his left toe amputated several months ago.  He states he sleeps well with his CPAP and has a good appetite.  He has no "official" diagnosis of depression or anxiety, but he admits he is struggling lately with a PHQ-9 of "10" and he was tearful at times talking to this counselor.  [name] reports his greatest stressor is his health.  He has goals to get stronger overall and have less shortness of breath.   Counselor  commended [place] for coming back into this program and his commitment to consistent  exercise to reach his stated goals.  He will be followed by staff throughout the course of this program.    Expected War will benefit from consistent exercise to achieve his stated goals.  He will also benefit from the psychoeducational components of this program to help with his mood and managing stress in his life.  [name] may need to see a counselor if these symptoms do now improve in the near future.     Continue Psychosocial Services  Follow up required by staff      Psychosocial Re-Evaluation:   Psychosocial Discharge (Final Psychosocial Re-Evaluation):   Vocational Rehabilitation: Provide vocational rehab assistance to qualifying candidates.   Vocational Rehab Evaluation & Intervention:     Vocational Rehab - 04/01/17 1635      Initial Vocational Rehab Evaluation & Intervention   Assessment shows need for Vocational Rehabilitation No      Education: Education Goals: Education classes will be provided on a weekly basis, covering required topics. Participant will state understanding/return demonstration of topics presented.  Learning Barriers/Preferences:     Learning Barriers/Preferences - 04/01/17 1634      Learning Barriers/Preferences   Learning Barriers Sight   Learning Preferences None      Education Topics: General Nutrition Guidelines/Fats and Fiber: -Group instruction provided by verbal, written material, models and posters to present the general guidelines for heart healthy nutrition. Gives an explanation and review of dietary fats and fiber.  Controlling Sodium/Reading Food Labels: -Group verbal and written material supporting the discussion of sodium use in heart healthy nutrition. Review and explanation with models, verbal and written materials for utilization of the food label.   Cardiac Rehab from 12/15/2016 in [facility] Cardiac and Pulmonary Rehab  Date  09/22/16  Educator  PI  Instruction Review Code  2- meets goals/outcomes       Exercise Physiology & Risk Factors: - Group verbal and written instruction with models to review the exercise physiology of the cardiovascular system and associated critical values. Details cardiovascular disease risk factors and the goals associated with each risk factor.   Cardiac Rehab from 12/15/2016 in [facility] Cardiac and Pulmonary Rehab  Date  11/24/16  Educator  [facility]  Instruction Review Code  2- meets goals/outcomes      Aerobic Exercise & Resistance Training: - Gives group verbal and written discussion on the health impact of inactivity. On the components of aerobic and resistive training programs and the benefits of this training and how to safely progress through these programs.   Cardiac Rehab from 12/15/2016 in [facility] Cardiac and Pulmonary Rehab  Date  11/26/16  Educator  Rema Fendt.  Instruction Review Code  2- meets goals/outcomes      Flexibility, Balance, General Exercise Guidelines: - Provides group verbal and written instruction on the benefits of flexibility and balance training programs. Provides general exercise guidelines with specific guidelines to those with heart or lung disease. Demonstration and skill practice provided.   Cardiac Rehab from 12/15/2016 in [facility] Cardiac and Pulmonary Rehab  Date  12/01/16  Educator  [facility]  Instruction Review Code  2- meets goals/outcomes      Stress Management: - Provides group verbal and written instruction about the health risks of elevated stress, cause of high stress, and healthy ways to reduce stress.   Cardiac Rehab from 12/15/2016 in [facility] Cardiac and Pulmonary Rehab  Date  12/10/16  Educator  [facility]  Instruction Review Code  2- meets goals/outcomes      Depression: - Provides group verbal and written instruction on the correlation between heart/lung disease and depressed mood, treatment options, and the stigmas associated with seeking treatment.   Cardiac Rehab from 12/15/2016 in [facility] Cardiac and Pulmonary Rehab  Date  09/17/16   Educator  Berle Mull, MSW  Instruction Review Code  2- meets goals/outcomes      Anatomy & Physiology of the Heart: - Group verbal and written instruction and models provide basic cardiac anatomy and physiology, with the coronary electrical and arterial systems. Review of: AMI, Angina, Valve disease, Heart Failure, Cardiac Arrhythmia, Pacemakers, and the ICD.   Cardiac Rehab from 12/15/2016 in [facility] Cardiac and Pulmonary Rehab  Date  12/08/16  Educator  C. Gaylord  Instruction Review Code  2- meets goals/outcomes      Cardiac Procedures: - Group verbal and written instruction and models to describe the testing methods done to diagnose heart disease. Reviews the outcomes of the test results. Describes the treatment choices: Medical Management, Angioplasty, or Coronary Bypass Surgery.   Cardiac Rehab from 04/13/2017 in [facility] Cardiac and Pulmonary Rehab  Date  04/06/17  Educator  SB  Instruction Review Code  2- meets goals/outcomes      Cardiac Medications: - Group verbal and written instruction to review commonly prescribed medications for heart disease. Reviews the medication, class of the drug, and side effects. Includes the steps to properly store meds and maintain the prescription regimen.   Cardiac Rehab from 04/13/2017 in  [facility] Cardiac and Pulmonary Rehab  Date  04/13/17 Alberteen Sam 1]  Educator  SB  Instruction Review Code  2- meets goals/outcomes      Go Sex-Intimacy & Heart Disease, Get SMART - Goal Setting: - Group verbal and written instruction through game format to discuss heart disease and the return to sexual intimacy. Provides group verbal and written material to discuss and apply goal setting through the application of the S.M.A.R.T. Method.   Cardiac Rehab from 04/13/2017 in [facility] Cardiac and Pulmonary Rehab  Date  04/06/17  Educator  SB  Instruction Review Code  2- meets goals/outcomes      Other Matters of the Heart: - Provides group verbal, written materials and  models to describe Heart Failure, Angina, Valve Disease, and Diabetes in the realm of heart disease. Includes description of the disease process and treatment options available to the cardiac patient.   Cardiac Rehab from 12/15/2016 in [facility] Cardiac and Pulmonary Rehab  Date  12/08/16  Educator  C. EnterkinRN  Instruction Review Code  2- meets goals/outcomes      Exercise & Equipment Safety: - Individual verbal instruction and demonstration of equipment use and safety with use of the equipment.   Cardiac Rehab from 04/13/2017 in [facility] Cardiac and Pulmonary Rehab  Date  04/01/17  Educator  C. EnterkinRN  Instruction Review Code  1- partially meets, needs review/practice      Infection Prevention: - Provides verbal and written material to individual with discussion of infection control including proper hand washing and proper equipment cleaning during exercise session.   Cardiac Rehab from 04/13/2017 in [facility] Cardiac and Pulmonary Rehab  Date  04/01/17  Educator  C. EnterkinRN  Instruction Review Code  2- meets goals/outcomes      Falls Prevention: - Provides verbal and written material to individual with discussion of falls prevention and safety.   Cardiac Rehab from 04/13/2017 in [facility] Cardiac and Pulmonary Rehab  Date  04/01/17  Educator  C. EnterkinRN  Instruction Review Code  2- meets goals/outcomes      Diabetes: - Individual verbal and written instruction to review signs/symptoms of diabetes, desired ranges of glucose level fasting, after meals and with exercise. Advice that pre and post exercise glucose checks will be done for 3 sessions at entry of program.   Cardiac Rehab from 04/13/2017 in [facility] Cardiac and Pulmonary Rehab  Date  04/01/17  Educator  C. EnterkinRN  Instruction Review Code  1- partially meets, needs review/practice       Knowledge Questionnaire Score:     Knowledge Questionnaire Score - 04/01/17 1634      Knowledge Questionnaire Score   Pre Score 16       Core Components/Risk Factors/Patient Goals at Admission:     Personal Goals and Risk Factors at Admission - 04/01/17 1645      Core Components/Risk Factors/Patient Goals on Admission   Admit Weight 300 lb (136.1 kg)   Goal Weight: Short Term 295 lb (133.8 kg)   Goal Weight: Long Term 220 lb (99.8 kg)   Expected Outcomes Short Term: Continue to assess and modify interventions until short term weight is achieved;Weight Loss: Understanding of general recommendations for a balanced deficit meal plan, which promotes 1-2 lb weight loss per week and includes a negative energy balance of [phone] kcal/d   Diabetes Yes   Intervention Provide education about signs/symptoms and action to take for hypo/hyperglycemia.;Provide education about proper nutrition, including hydration, and aerobic/resistive exercise prescription along with prescribed medications to  achieve blood glucose in normal ranges: Fasting glucose 65-99 mg/dL   Expected Outcomes Short Term: Participant verbalizes understanding of the signs/symptoms and immediate care of hyper/hypoglycemia, proper foot care and importance of medication, aerobic/resistive exercise and nutrition plan for blood glucose control.;Long Term: Attainment of HbA1C < 7%.   Hypertension Yes   Intervention Provide education on lifestyle modifcations including regular physical activity/exercise, weight management, moderate sodium restriction and increased consumption of fresh fruit, vegetables, and low fat dairy, alcohol moderation, and smoking cessation.;Monitor prescription use compliance.   Expected Outcomes Short Term: Continued assessment and intervention until BP is < 140/54m HG in hypertensive participants. < 130/861mHG in hypertensive participants with diabetes, heart failure or chronic kidney disease.;Long Term: Maintenance of blood pressure at goal levels.   Lipids Yes   Intervention Provide education and support for participant on nutrition &  aerobic/resistive exercise along with prescribed medications to achieve LDL <7033mHDL >43m61m Expected Outcomes Short Term: Participant states understanding of desired cholesterol values and is compliant with medications prescribed. Participant is following exercise prescription and nutrition guidelines.;Long Term: Cholesterol controlled with medications as prescribed, with individualized exercise RX and with personalized nutrition plan. Value goals: LDL < 70mg74mL > 40 mg.      Core Components/Risk Factors/Patient Goals Review:    Core Components/Risk Factors/Patient Goals at Discharge (Final Review):    ITP Comments:     ITP Comments    Row Name 04/01/17 1643 04/09/17 1720 04/14/17 1209 04/22/17 0556     ITP Comments  Medical review started after informed consent was signed for Cardiac REhab then ITP was created. Diagnosis in CHL/EPIC. [place]oyd[place]st pain during exercise.  It resolved with rest and he completed the session with no other issues. Froylan continues to have angina symptoms with exercise and at home at rest.  Reviewing his meds with him, we discovered he ahd been on Renexa and has been off since one of his hospitalizations.  I advised WaynePatrick Jupiterall his physician and ask about resumong the Renexa for possible symptom control.  30 day review. Continue with ITP unless directed changes per Medical Director review       Comments:

## 2017-04-22 NOTE — ED Notes (Signed)
Pt ambulatory to toilet with assistance. Urine collected in urinal in case need for specimen.

## 2017-04-22 NOTE — ED Triage Notes (Signed)
Pt arrive to ED via [facility] from home. Pt c/o mid/left sided CP that began around 6. Pt took 3 nitro at home and EMS gave 1 nitro spray. Pt is scheduled to have cath done next Tuesday. Sees kowalski for cardiology. Pain was 8/10 then 4/10 after nitro. Now 1/10. Pt has hx of multiple MI with 4 CABG's. Pt is alert and oriented upon arrival. Has edema in legs that states are better, wearing stockings. Pt c/o dizziness. Denies N&V, sweaty, SOB.

## 2017-04-23 DIAGNOSIS — I25110 Atherosclerotic heart disease of native coronary artery with unstable angina pectoris: Secondary | ICD-10-CM | POA: Diagnosis not present

## 2017-04-23 DIAGNOSIS — I200 Unstable angina: Secondary | ICD-10-CM | POA: Diagnosis not present

## 2017-04-23 DIAGNOSIS — E785 Hyperlipidemia, unspecified: Secondary | ICD-10-CM | POA: Diagnosis not present

## 2017-04-23 DIAGNOSIS — I10 Essential (primary) hypertension: Secondary | ICD-10-CM | POA: Diagnosis not present

## 2017-04-23 DIAGNOSIS — I25790 Atherosclerosis of other coronary artery bypass graft(s) with unstable angina pectoris: Secondary | ICD-10-CM | POA: Diagnosis not present

## 2017-04-23 DIAGNOSIS — I2510 Atherosclerotic heart disease of native coronary artery without angina pectoris: Secondary | ICD-10-CM | POA: Diagnosis not present

## 2017-04-23 LAB — BASIC METABOLIC PANEL
Anion gap: 5 (ref 5–15)
BUN: 24 mg/dL — AB (ref 6–20)
CO2: 28 mmol/L (ref 22–32)
CREATININE: 0.94 mg/dL (ref 0.61–1.24)
Calcium: 9.3 mg/dL (ref 8.9–10.3)
Chloride: 105 mmol/L (ref 101–111)
GFR calc Af Amer: >60 mL/min (ref >60)
GFR calc non Af Amer: >60 mL/min (ref >60)
GLUCOSE: 183 mg/dL — AB (ref 65–99)
Potassium: 4.1 mmol/L (ref 3.5–5.1)
SODIUM: 138 mmol/L (ref 135–145)

## 2017-04-23 LAB — MAGNESIUM: Magnesium: 2 mg/dL (ref 1.7–2.4)

## 2017-04-23 LAB — LIPID PANEL
CHOL/HDL RATIO: 2 ratio
CHOLESTEROL: 137 mg/dL (ref 0–200)
HDL: 67 mg/dL (ref >40)
LDL Cholesterol: 61 mg/dL (ref 0–99)
Triglycerides: 45 mg/dL (ref <150)
VLDL: 9 mg/dL (ref 0–40)

## 2017-04-23 LAB — GLUCOSE, CAPILLARY
GLUCOSE-CAPILLARY: 418 mg/dL — AB (ref 65–99)
Glucose-Capillary: 326 mg/dL — ABNORMAL HIGH (ref 65–99)
Glucose-Capillary: 319 mg/dL — ABNORMAL HIGH (ref 65–99)

## 2017-04-23 LAB — CBC
HCT: 38.2 % — ABNORMAL LOW (ref 40.0–52.0)
HEMOGLOBIN: 13 g/dL (ref 13.0–18.0)
MCH: 31.8 pg (ref 26.0–34.0)
MCHC: 34.2 g/dL (ref 32.0–36.0)
MCV: 92.9 fL (ref 80.0–100.0)
PLATELETS: 222 10*3/uL (ref 150–440)
RBC: 4.11 MIL/uL — AB (ref 4.40–5.90)
RDW: 13.8 % (ref 11.5–14.5)
WBC: 6.4 10*3/uL (ref 3.8–10.6)

## 2017-04-23 LAB — TROPONIN I: Troponin I: <0.03 ng/mL (ref <0.03)

## 2017-04-23 LAB — PROTIME-INR
INR: 1.07
Prothrombin Time: 13.9 seconds (ref 11.4–15.2)

## 2017-04-23 LAB — TSH: TSH: 2.907 u[IU]/mL (ref 0.350–4.500)

## 2017-04-23 MED ORDER — RANOLAZINE ER 500 MG PO TB12: 500.0000 mg | ORAL_TABLET | Freq: Two times a day (BID) | ORAL | 0 refills | Status: DC

## 2017-04-23 MED ORDER — INSULIN GLARGINE 100 UNIT/ML ~~LOC~~ SOLN
12.0000 [IU] | Freq: Every day | SUBCUTANEOUS | Status: DC
  Administered 2017-04-23: 12 [IU] via SUBCUTANEOUS
  Filled 2017-04-23 (×3): qty 0.12

## 2017-04-23 MED ORDER — ROSUVASTATIN CALCIUM 10 MG PO TABS: 20.0000 mg | ORAL_TABLET | Freq: Every day | ORAL | Status: DC

## 2017-04-23 MED ORDER — INSULIN ASPART 100 UNIT/ML ~~LOC~~ SOLN
SUBCUTANEOUS | Status: AC
  Administered 2017-04-23: 20 [IU] via SUBCUTANEOUS
  Filled 2017-04-23: qty 1

## 2017-04-23 MED ORDER — INSULIN ASPART 100 UNIT/ML ~~LOC~~ SOLN: 0.0000 [IU] | Freq: Every day | SUBCUTANEOUS | Status: DC

## 2017-04-23 MED ORDER — ACETAMINOPHEN 325 MG PO TABS
650.0000 mg | ORAL_TABLET | ORAL | Status: DC | PRN
  Administered 2017-04-23: 650 mg via ORAL
  Filled 2017-04-23: qty 2

## 2017-04-23 MED ORDER — LORATADINE 10 MG PO TABS
10.0000 mg | ORAL_TABLET | Freq: Every day | ORAL | Status: DC
  Administered 2017-04-23: 10 mg via ORAL
  Filled 2017-04-23: qty 1

## 2017-04-23 MED ORDER — METOPROLOL TARTRATE 25 MG PO TABS
25.0000 mg | ORAL_TABLET | Freq: Two times a day (BID) | ORAL | Status: DC
  Administered 2017-04-23: 25 mg via ORAL
  Filled 2017-04-23: qty 1

## 2017-04-23 MED ORDER — ISOSORBIDE MONONITRATE ER 30 MG PO TB24
30.0000 mg | ORAL_TABLET | Freq: Every day | ORAL | Status: DC
  Administered 2017-04-23: 30 mg via ORAL
  Filled 2017-04-23: qty 1

## 2017-04-23 MED ORDER — ASPIRIN EC 81 MG PO TBEC
81.0000 mg | DELAYED_RELEASE_TABLET | Freq: Every day | ORAL | Status: DC
  Administered 2017-04-23: 81 mg via ORAL
  Filled 2017-04-23: qty 1

## 2017-04-23 MED ORDER — HEPARIN SODIUM (PORCINE) 5000 UNIT/ML IJ SOLN
5000.0000 [IU] | Freq: Three times a day (TID) | INTRAMUSCULAR | Status: DC
Start: 2017-04-23 — End: 2017-04-23
  Administered 2017-04-23: 5000 [IU] via SUBCUTANEOUS
  Filled 2017-04-23: qty 1

## 2017-04-23 MED ORDER — ONDANSETRON HCL 4 MG/2ML IJ SOLN: 4.0000 mg | Freq: Four times a day (QID) | INTRAMUSCULAR | Status: DC | PRN

## 2017-04-23 MED ORDER — ADULT MULTIVITAMIN W/MINERALS CH
1.0000 | ORAL_TABLET | Freq: Every day | ORAL | Status: DC
  Administered 2017-04-23: 1 via ORAL
  Filled 2017-04-23: qty 1

## 2017-04-23 MED ORDER — NITROGLYCERIN 0.4 MG SL SUBL: 0.4000 mg | SUBLINGUAL_TABLET | SUBLINGUAL | Status: DC | PRN

## 2017-04-23 MED ORDER — CLOPIDOGREL BISULFATE 75 MG PO TABS
75.0000 mg | ORAL_TABLET | Freq: Every day | ORAL | Status: DC
  Administered 2017-04-23: 75 mg via ORAL
  Filled 2017-04-23: qty 1

## 2017-04-23 MED ORDER — FUROSEMIDE 40 MG PO TABS
40.0000 mg | ORAL_TABLET | Freq: Every day | ORAL | Status: DC
  Administered 2017-04-23: 40 mg via ORAL
  Filled 2017-04-23: qty 1

## 2017-04-23 MED ORDER — VITAMIN C 500 MG PO TABS
500.0000 mg | ORAL_TABLET | Freq: Two times a day (BID) | ORAL | Status: DC
  Administered 2017-04-23: 500 mg via ORAL
  Filled 2017-04-23 (×3): qty 1

## 2017-04-23 MED ORDER — DOCUSATE SODIUM 100 MG PO CAPS
200.0000 mg | ORAL_CAPSULE | Freq: Two times a day (BID) | ORAL | Status: DC
  Administered 2017-04-23: 200 mg via ORAL
  Filled 2017-04-23: qty 2

## 2017-04-23 MED ORDER — LATANOPROST 0.005 % OP SOLN
1.0000 [drp] | Freq: Every day | OPHTHALMIC | Status: DC
  Administered 2017-04-23: 1 [drp] via OPHTHALMIC
  Filled 2017-04-23: qty 2.5

## 2017-04-23 MED ORDER — INSULIN ASPART 100 UNIT/ML ~~LOC~~ SOLN
0.0000 [IU] | Freq: Three times a day (TID) | SUBCUTANEOUS | Status: DC
  Administered 2017-04-23: 15 [IU] via SUBCUTANEOUS
  Administered 2017-04-23: 20 [IU] via SUBCUTANEOUS
  Filled 2017-04-23: qty 1

## 2017-04-23 MED ORDER — MULTIVITAMINS PO CAPS: 1.0000 | ORAL_CAPSULE | Freq: Every day | ORAL | Status: DC

## 2017-04-23 MED ORDER — ASPIRIN 81 MG PO CHEW: 81.0000 mg | CHEWABLE_TABLET | ORAL | Status: DC

## 2017-04-23 MED ORDER — POTASSIUM CHLORIDE CRYS ER 20 MEQ PO TBCR
20.0000 meq | EXTENDED_RELEASE_TABLET | Freq: Every day | ORAL | Status: DC
  Administered 2017-04-23: 20 meq via ORAL
  Filled 2017-04-23: qty 1

## 2017-04-23 MED ORDER — RANOLAZINE ER 500 MG PO TB12
500.0000 mg | ORAL_TABLET | Freq: Two times a day (BID) | ORAL | Status: DC
  Administered 2017-04-23: 500 mg via ORAL
  Filled 2017-04-23 (×2): qty 1

## 2017-04-23 MED ORDER — VITAMIN D 1000 UNITS PO TABS
1000.0000 [IU] | ORAL_TABLET | Freq: Every day | ORAL | Status: DC
  Administered 2017-04-23: 1000 [IU] via ORAL
  Filled 2017-04-23: qty 1

## 2017-04-23 NOTE — Discharge Summary (Signed)
[facility] - [place] at [facility], 66 y.o., DOB [birth year], MRN [id number]. Admission date: 04/22/2017 Discharge Date 04/23/2017 Primary MD Pleas Koch, NP Admitting Physician Harvie Bridge, DO  Admission Diagnosis  Chest pain, unspecified type [R07.9]  Discharge Diagnosis   Active Problems:   Unstable angina [facility])  Coronary artery disease Diabetes type 2 Elevated lipids Glaucoma Essential hypertension Neuropathy Osteoarthritis Skin cancer Sleep apnea       Hospital Course Patient is a 66 year old with known history of coronary artery disease status post bypass presented to the emergency room with complaint of chest pain. Patient was seen in the emergency room due to his risk factors he was admitted for further evaluation and therapy. He had cardiac catheterization in March which showed occluded RCA graft. With collaterals. He was medically being managed. He presented with these symptoms. His troponins remained negative. Cardiology saw the patient and recommended outpatient follow-up with primary cardiologist patient is currently chest pain-free.            Consults  cardiology  Significant Tests:  See full reports for all details     Dg Chest 2 View  Result Date: 04/22/2017 CLINICAL DATA:  66 year old male with left-sided chest pain EXAM: CHEST  2 VIEW COMPARISON:  Prior chest x-ray 03/17/2017 FINDINGS: The lungs are clear and negative for focal airspace consolidation, pulmonary edema or suspicious pulmonary nodule. No pleural effusion or pneumothorax. Cardiac and mediastinal contours are within normal limits. Patient is status post median sternotomy with evidence of prior multivessel CABG including LIMA bypass. No acute fracture or lytic or blastic osseous lesions. The visualized upper abdominal bowel gas pattern is unremarkable. IMPRESSION: No active cardiopulmonary disease. Electronically Signed   By: Jacqulynn Cadet M.D.    On: 04/22/2017 21:33       Today   Subjective:   Lawrence [name]  Denies any cp  Objective:   Blood pressure (!) 126/44, pulse 72, temperature 98.3 F (36.8 C), temperature source Oral, resp. rate 19, height 5\' 10"  (1.778 m), weight (!) 300 lb 1.6 oz (136.1 kg), SpO2 96 %.  .  Intake/Output Summary (Last 24 hours) at 04/23/17 1417 Last data filed at 04/23/17 0750  Gross per 24 hour  Intake                0 ml  Output              600 ml  Net             -600 ml    Exam VITAL SIGNS: Blood pressure (!) 126/44, pulse 72, temperature 98.3 F (36.8 C), temperature source Oral, resp. rate 19, height 5\' 10"  (1.778 m), weight (!) 300 lb 1.6 oz (136.1 kg), SpO2 96 %.  GENERAL:  66 y.o.-year-old patient lying in the bed with no acute distress.  EYES: Pupils equal, round, reactive to light and accommodation. No scleral icterus. Extraocular muscles intact.  HEENT: Head atraumatic, normocephalic. Oropharynx and nasopharynx clear.  NECK:  Supple, no jugular venous distention. No thyroid enlargement, no tenderness.  LUNGS: Normal breath sounds bilaterally, no wheezing, rales,rhonchi or crepitation. No use of accessory muscles of respiration.  CARDIOVASCULAR: S1, S2 normal. No murmurs, rubs, or gallops.  ABDOMEN: Soft, nontender, nondistended. Bowel sounds present. No organomegaly or mass.  EXTREMITIES: No pedal edema, cyanosis, or clubbing.  NEUROLOGIC: Cranial nerves II through XII are intact. Muscle strength 5/5 in all extremities. Sensation intact. Gait not checked.  PSYCHIATRIC: The patient  is alert and oriented x 3.  SKIN: No obvious rash, lesion, or ulcer.   Data Review     CBC w Diff: Lab Results  Component Value Date   WBC 6.4 04/23/2017   HGB 13.0 04/23/2017   HGB 12.8 (L) 10/12/2013   HCT 38.2 (L) 04/23/2017   HCT 36.9 (L) 10/12/2013   PLT 222 04/23/2017   PLT 215 10/12/2013   LYMPHOPCT 19 06/23/2016   LYMPHOPCT 33.1 10/12/2013   MONOPCT 7 06/23/2016   MONOPCT 10.1  10/12/2013   EOSPCT 2 06/23/2016   EOSPCT 2.8 10/12/2013   BASOPCT 1 06/23/2016   BASOPCT 0.6 10/12/2013   CMP: Lab Results  Component Value Date   NA 138 04/23/2017   NA 137 10/12/2013   K 4.1 04/23/2017   K 3.9 10/12/2013   CL 105 04/23/2017   CL 101 10/12/2013   CO2 28 04/23/2017   CO2 31 10/12/2013   BUN 24 (H) 04/23/2017   BUN 20 (H) 10/12/2013   CREATININE 0.94 04/23/2017   CREATININE 0.99 10/12/2013   PROT 7.0 09/08/2016   ALBUMIN 4.1 09/08/2016   BILITOT 0.4 09/08/2016   ALKPHOS 139 (H) 09/08/2016   AST 14 09/08/2016   ALT 12 09/08/2016  .  Micro Results No results found for this or any previous visit (from the past 240 hour(s)).      Code Status Orders        Start     Ordered   04/23/17 0048  Full code  Continuous     04/23/17 0047    Code Status History    Date Active Date Inactive Code Status Order ID Comments User Context   03/18/2017  1:47 AM 03/19/2017  8:04 PM Full Code [id number]  [place], Ubaldo Glassing, DO Inpatient   01/13/2017  8:45 AM 01/13/2017  1:34 PM Full Code [id number]  Corey Skains, MD Inpatient   06/27/2016  3:12 PM 07/03/2016  3:48 PM Full Code [id number]  John Giovanni, PA-C Inpatient   06/24/2016  3:59 AM 06/27/2016  3:12 PM Full Code [id number]  Minus Breeding, MD Inpatient   06/11/2016  9:33 AM 06/11/2016  1:52 PM Full Code [id number]  Corey Skains, MD Inpatient    Advance Directive Documentation     Most Recent Value  Type of Advance Directive  Living will, Healthcare Power of Attorney  Pre-existing out of facility DNR order (yellow form or pink MOST form)  -  "MOST" Form in Place?  -          Follow-up Information    Pleas Koch, NP. Go on 04/29/2017.   Specialty:  Internal Medicine Why:  Appointment Time: 9:45am Contact information: [place] [address] [phone]        Corey Skains, MD. Go on 05/11/2017.   Specialty:  Cardiology Why:  cad, Appointment Time: 3:00pm Contact  information: [address] [facility] [place] [address] [phone]           Discharge Medications   Allergies as of 04/23/2017      Reactions   Nitroglycerin Nausea Only, Other (See Comments)   Patches only - headache   Statins Other (See Comments)   BODY PAIN   Lipitor [atorvastatin] Other (See Comments), Rash   BODY PAIN   Lisinopril Other (See Comments)   Light headed, dizzy      Medication List    TAKE these medications   aspirin 81 MG EC tablet  Take 81 mg by mouth daily. Swallow whole.   cholecalciferol 1000 units tablet Commonly known as:  VITAMIN D Take 1,000 Units by mouth daily.   clopidogrel 75 MG tablet Commonly known as:  PLAVIX Take 1 tablet (75 mg total) by mouth daily.   docusate sodium 100 MG capsule Commonly known as:  COLACE Take 200 mg by mouth 2 (two) times daily.   fexofenadine 180 MG tablet Commonly known as:  ALLEGRA Take 180 mg by mouth daily.   furosemide 40 MG tablet Commonly known as:  LASIX Take 1 tablet (40 mg total) by mouth daily.   GLUCAGON EMERGENCY 1 MG injection Generic drug:  glucagon Inject 1 mg into the vein once as needed. Reported on 02/11/2016   insulin aspart 100 UNIT/ML injection Commonly known as:  NOVOLOG Inject 20 Units into the skin 3 (three) times daily. What changed:  when to take this   insulin glargine 100 UNIT/ML injection Commonly known as:  LANTUS Inject 0.24 mLs (24 Units total) into the skin at bedtime.   isosorbide mononitrate 30 MG 24 hr tablet Commonly known as:  IMDUR Take 30 mg by mouth daily.   latanoprost 0.005 % ophthalmic solution Commonly known as:  XALATAN Place 1 drop into both eyes at bedtime.   metoprolol tartrate 25 MG tablet Commonly known as:  LOPRESSOR Take 25 mg by mouth 2 (two) times daily.   multivitamin capsule Take 1 capsule by mouth daily.   nitroGLYCERIN 0.4 MG SL tablet Commonly known as:  NITROSTAT Place 1 tablet (0.4 mg  total) under the tongue every 5 (five) minutes as needed for chest pain.   potassium chloride SA 20 MEQ tablet Commonly known as:  K-DUR,KLOR-CON Take 1 tablet (20 mEq total) by mouth daily.   ranolazine 500 MG 12 hr tablet Commonly known as:  RANEXA Take 1 tablet (500 mg total) by mouth 2 (two) times daily.   rosuvastatin 20 MG tablet Commonly known as:  CRESTOR Take 1 tablet (20 mg total) by mouth daily at 6 PM.   vitamin C 500 MG tablet Commonly known as:  ASCORBIC ACID Take 500 mg by mouth 2 (two) times daily.          Total Time in preparing paper work, data evaluation and todays exam - 35 minutes  Dustin Flock M.D on 04/23/2017 at 2:17 PM  [facility]   Office  [phone]

## 2017-04-23 NOTE — Discharge Instructions (Signed)
[place] at [place]:  Diabetic diet, cardiac  DISCHARGE CONDITION:  Good  ACTIVITY:  Activity as tolerated  OXYGEN:  Home Oxygen: No.   Oxygen Delivery: room air  DISCHARGE LOCATION:  home    ADDITIONAL DISCHARGE INSTRUCTION:   If you experience worsening of your admission symptoms, develop shortness of breath, life threatening emergency, suicidal or homicidal thoughts you must seek medical attention immediately by calling 911 or calling your MD immediately  if symptoms less severe.  You Must read complete instructions/literature along with all the possible adverse reactions/side effects for all the Medicines you take and that have been prescribed to you. Take any new Medicines after you have completely understood and accpet all the possible adverse reactions/side effects.   Please note  You were cared for by a hospitalist during your hospital stay. If you have any questions about your discharge medications or the care you received while you were in the hospital after you are discharged, you can call the unit and asked to speak with the hospitalist on call if the hospitalist that took care of you is not available. Once you are discharged, your primary care physician will handle any further medical issues. Please note that NO REFILLS for any discharge medications will be authorized once you are discharged, as it is imperative that you return to your primary care physician (or establish a relationship with a primary care physician if you do not have one) for your aftercare needs so that they can reassess your need for medications and monitor your lab values.

## 2017-04-23 NOTE — Care Management Obs Status (Signed)
[place] NOTIFICATION   Patient Details  Name: Lawrence [name] MRN: [id number] Date of Birth: 05-May-1951   Medicare Observation Status Notification Given:  Yes    Marshell Garfinkel, RN 04/23/2017, 1:03 PM

## 2017-04-23 NOTE — Progress Notes (Signed)
Inpatient Diabetes Program Recommendations  AACE/ADA: New Consensus Statement on Inpatient Glycemic Control (2015)  Target Ranges:  Prepandial:   less than 140 mg/dL      Peak postprandial:   less than 180 mg/dL (1-2 hours)      Critically ill patients:  140 - 180 mg/dL   Lab Results  Component Value Date   GLUCAP 326 (H) 04/23/2017   HGBA1C 7.9 (H) 03/18/2017    Review of Glycemic Control:  Results for KAILO, KOSIK (MRN [id number]) as of 04/23/2017 08:46  Ref. Range 04/23/2017 04:36 04/23/2017 07:48  Glucose-Capillary Latest Ref Range: 65 - 99 mg/dL 418 (H) 326 (H)   Diabetes history: DM Outpatient Diabetes medications: Novolog 20 units tid with meals, Lantus 24 units q HS Current orders for Inpatient glycemic control:  Novolog resistant tid with meals and HS Lantus 12 units q HS Inpatient Diabetes Program Recommendations:   Please consider increasing Lantus to 24 units q HS and increase frequency of Novolog to q 4 hours while NPO. May consider reducing Novolog correction to moderate q 4 hours.   Thanks, Adah Perl, RN, BC-ADM Inpatient Diabetes Coordinator Pager [phone] (8a-5p)

## 2017-04-23 NOTE — Progress Notes (Signed)
Patient's blood sugar level is 412, notified Dr.Pyreddy for orders. Will continue to monitor and assess.

## 2017-04-23 NOTE — Progress Notes (Signed)
Patient ambulated around nurses station per dr. Saralyn Pilar request. No c/o pain no distress noted. Dr. Posey Pronto made aware that cardiac cath was discontinued per dr. Saralyn Pilar, patient tolerated ambulation. Per dr. Saralyn Pilar start patient on renexa 500 mg po bid

## 2017-04-23 NOTE — Care Management Obs Status (Signed)
[place] NOTIFICATION   Patient Details  Name: Lawrence [name] MRN: [id number] Date of Birth: 09/22/1951   Medicare Observation Status Notification Given:  Yes    Beau Fanny, RN 04/23/2017, 1:42 PM

## 2017-04-23 NOTE — Care Management Note (Signed)
Case Management Note  Patient Details  Name: ANGELINA NEECE MRN: [id number] Date of Birth: 1951/08/11  Subjective/Objective:                  Met with patient and his wife to discuss discharge planing needs as patient has presented to hospital twice in last 6 months. He will be re-started on Renexa per our discussion however patient states he was previously on Renexa until his cardiac procedure and was never restarted. He states that "it cost me nothing the last time I was on it". He states he has [employer] and they pay almost all costs for medications. He is affiliated with [facility] however he does not want to transfer and anticipates discharge to home today- UR notified and will watch. He is independent and able to drive.   Action/Plan: [facility] forms faxed on patient decision to stay at [facility]. No current RNCM needs.  Expected Discharge Date:                  Expected Discharge Plan:     In-House Referral:     Discharge planning Services  CM Consult  Post Acute Care Choice:    Choice offered to:  Patient, Spouse  DME Arranged:    DME Agency:     HH Arranged:    [place] Agency:     Status of Service:  In process, will continue to follow  If discussed at Long Length of Stay Meetings, dates discussed:    Additional Comments:  Marshell Garfinkel, RN 04/23/2017, 10:50 AM

## 2017-04-23 NOTE — Consult Note (Signed)
[facility] Cardiology  CARDIOLOGY CONSULT NOTE  Patient ID: Lawrence [name] MRN: [id number] DOB/AGE: 01-05-1951 66 y.o.  Admit date: 04/22/2017 Referring Physician Posey Pronto Primary Physician [facility] Primary Cardiologist Nehemiah Massed Reason for Consultation Chest pain  HPI: 66 year old gentleman referred for evaluation chest pain. The patient is status post CABG 38/25/2017. The patient underwent repeat cardiac catheterization 01/13/2017 which revealed severe three-vessel coronary artery disease, with diffuse distal disease involving LAD, second and third obtuse marginal branches, and distal RCA. LIMA graft to the LAD was patent with diffuse distal native disease. Saphenous vein bypass graft to OM1 was patent. The saphenous vein bypass graft to RCA was occluded. Local therapy is recommended. The patient reports he's had intermittent episodes of chronic exertional dyspnea. More recently, patient has developed recent chest discomfort while at cardiac rehabilitation. He is scheduled for elective cardiac catheterization next week. He presented to [facility] emergency room chief complaint of chest pain, largely relieved after sublingual nitroglycerin. Admission labs were negative troponin. EKG was nondiagnostic.  Review of systems complete and found to be negative unless listed above     Past Medical History:  Diagnosis Date  . CAD (coronary artery disease)    07/2007  . Diabetes ([place])   . Elevated lipids   . Glaucoma   . HBP (high blood pressure)   . Neuropathy   . Osteoarthritis   . Skin cancer   . Sleep apnea    CPAP    Past Surgical History:  Procedure Laterality Date  . APPENDECTOMY    . CARDIAC CATHETERIZATION N/A 06/11/2016   Procedure: Left Heart Cath and Coronary Angiography;  Surgeon: Corey Skains, MD;  Location: [place] CV LAB;  Service: Cardiovascular;  Laterality: N/A;  . CARPAL TUNNEL RELEASE Left 04/24/2016   Procedure: CARPAL TUNNEL RELEASE;  Surgeon: Hessie Knows, MD;  Location: [facility]  ORS;  Service: Orthopedics;  Laterality: Left;  . CATARACT EXTRACTION W/ INTRAOCULAR LENS  IMPLANT, BILATERAL Bilateral   . CATARACT EXTRACTION, BILATERAL    . CORONARY ARTERY BYPASS GRAFT N/A 06/27/2016   Procedure: CORONARY ARTERY BYPASS GRAFTING times four using left internal mammary artery and right leg saphenous vein;  Surgeon: Ivin Poot, MD;  Location: [place];  Service: Open Heart Surgery;  Laterality: N/A;  . EYE SURGERY Bilateral   . JOINT REPLACEMENT    . KNEE ARTHROPLASTY Left 02/11/2016   Procedure: COMPUTER ASSISTED TOTAL KNEE ARTHROPLASTY;  Surgeon: Dereck Leep, MD;  Location: [facility] ORS;  Service: Orthopedics;  Laterality: Left;  . KNEE ARTHROSCOPY Right   . LEFT HEART CATH AND CORS/GRAFTS ANGIOGRAPHY Left 01/13/2017   Procedure: Left Heart Cath and Cors/Grafts Angiography;  Surgeon: Corey Skains, MD;  Location: [place] CV LAB;  Service: Cardiovascular;  Laterality: Left;  . TEE WITHOUT CARDIOVERSION N/A 06/27/2016   Procedure: TRANSESOPHAGEAL ECHOCARDIOGRAM (TEE);  Surgeon: Ivin Poot, MD;  Location: [place];  Service: Open Heart Surgery;  Laterality: N/A;    Prescriptions Prior to Admission  Medication Sig Dispense Refill Last Dose  . aspirin 81 MG EC tablet Take 81 mg by mouth daily. Swallow whole.   04/22/2017 at Unknown time  . cholecalciferol (VITAMIN D) 1000 units tablet Take 1,000 Units by mouth daily.   04/22/2017 at Unknown time  . clopidogrel (PLAVIX) 75 MG tablet Take 1 tablet (75 mg total) by mouth daily. 30 tablet 0 04/22/2017 at Unknown time  . docusate sodium (COLACE) 100 MG capsule Take 200 mg by mouth 2 (two) times daily.   Taking  .  fexofenadine (ALLEGRA) 180 MG tablet Take 180 mg by mouth daily.   Taking  . furosemide (LASIX) 40 MG tablet Take 1 tablet (40 mg total) by mouth daily. 30 tablet 1 04/22/2017 at Unknown time  . glucagon (GLUCAGON EMERGENCY) 1 MG injection Inject 1 mg into the vein once as needed. Reported on 02/11/2016   Taking  .  insulin aspart (NOVOLOG) 100 UNIT/ML injection Inject 20 Units into the skin 3 (three) times daily. (Patient taking differently: Inject 20 Units into the skin 3 (three) times daily with meals. ) 10 mL 5 04/22/2017 at Unknown time  . insulin glargine (LANTUS) 100 UNIT/ML injection Inject 0.24 mLs (24 Units total) into the skin at bedtime. 10 mL 5 04/21/2017 at Unknown time  . isosorbide mononitrate (IMDUR) 30 MG 24 hr tablet Take 30 mg by mouth daily.     [name] latanoprost (XALATAN) 0.005 % ophthalmic solution Place 1 drop into both eyes at bedtime.   04/21/2017 at Unknown time  . metoprolol tartrate (LOPRESSOR) 25 MG tablet Take 25 mg by mouth 2 (two) times daily.   04/22/2017 at Unknown time  . Multiple Vitamin (MULTIVITAMIN) capsule Take 1 capsule by mouth daily.   04/22/2017 at Unknown time  . nitroGLYCERIN (NITROSTAT) 0.4 MG SL tablet Place 1 tablet (0.4 mg total) under the tongue every 5 (five) minutes as needed for chest pain. 50 tablet 1   . potassium chloride SA (K-DUR,KLOR-CON) 20 MEQ tablet Take 1 tablet (20 mEq total) by mouth daily. 30 tablet 1 04/22/2017 at Unknown time  . rosuvastatin (CRESTOR) 20 MG tablet Take 1 tablet (20 mg total) by mouth daily at 6 PM. 30 tablet 1 04/22/2017 at Unknown time  . vitamin C (ASCORBIC ACID) 500 MG tablet Take 500 mg by mouth 2 (two) times daily.    04/22/2017 at Unknown time   Social History   Social History  . Marital status: Married    Spouse name: N/A  . Number of children: N/A  . Years of education: N/A   Occupational History  . Not on file.   Social History Main Topics  . Smoking status: Former Smoker    Packs/day: 1.00    Years: 16.00    Types: Cigarettes    Start date: 11/03/1969    Quit date: 07/22/1986  . Smokeless tobacco: Never Used  . Alcohol use 0.0 oz/week     Comment: OCCASIONALLY  . Drug use: No  . Sexual activity: No   Other Topics Concern  . Not on file   Social History Narrative   Married.  Lives at home with wife.      Family History  Problem Relation Age of Onset  . Drug abuse Brother   . Hypertension Mother   . Lupus Mother   . Hypertension Father   . Diabetes Father   . Heart attack Father 44       died in his 38s  . Arthritis Maternal Grandmother   . Arthritis Maternal Grandfather   . Arthritis Paternal Grandmother   . Arthritis Paternal Grandfather   . Heart disease Brother       Review of systems complete and found to be negative unless listed above      PHYSICAL EXAM  General: Well developed, well nourished, in no acute distress HEENT:  Normocephalic and atramatic Neck:  No JVD.  Lungs: Clear bilaterally to auscultation and percussion. Heart: HRRR . Normal S1 and S2 without gallops or murmurs.  Abdomen: Bowel sounds are positive,  abdomen soft and non-tender  Msk:  Back normal, normal gait. Normal strength and tone for age. Extremities: No clubbing, cyanosis or edema.   Neuro: Alert and oriented X 3. Psych:  Good affect, responds appropriately  Labs:   Lab Results  Component Value Date   WBC 6.4 04/23/2017   HGB 13.0 04/23/2017   HCT 38.2 (L) 04/23/2017   MCV 92.9 04/23/2017   PLT 222 04/23/2017    Recent Labs Lab 04/23/17 0937  NA 138  K 4.1  CL 105  CO2 28  BUN 24*  CREATININE 0.94  CALCIUM 9.3  GLUCOSE 183*   Lab Results  Component Value Date   TROPONINI <0.03 04/23/2017    Lab Results  Component Value Date   CHOL 137 04/23/2017   CHOL 143 03/18/2017   CHOL 164 09/08/2016   Lab Results  Component Value Date   HDL 67 04/23/2017   HDL 67 03/18/2017   HDL 81.00 09/08/2016   Lab Results  Component Value Date   LDLCALC 61 04/23/2017   LDLCALC 63 03/18/2017   LDLCALC 69 09/08/2016   Lab Results  Component Value Date   TRIG 45 04/23/2017   TRIG 63 03/18/2017   TRIG 69.0 09/08/2016   Lab Results  Component Value Date   CHOLHDL 2.0 04/23/2017   CHOLHDL 2.1 03/18/2017   CHOLHDL 2 09/08/2016   No results found for: LDLDIRECT    Radiology:  Dg Chest 2 View  Result Date: 04/22/2017 CLINICAL DATA:  66 year old male with left-sided chest pain EXAM: CHEST  2 VIEW COMPARISON:  Prior chest x-ray 03/17/2017 FINDINGS: The lungs are clear and negative for focal airspace consolidation, pulmonary edema or suspicious pulmonary nodule. No pleural effusion or pneumothorax. Cardiac and mediastinal contours are within normal limits. Patient is status post median sternotomy with evidence of prior multivessel CABG including LIMA bypass. No acute fracture or lytic or blastic osseous lesions. The visualized upper abdominal bowel gas pattern is unremarkable. IMPRESSION: No active cardiopulmonary disease. Electronically Signed   By: Jacqulynn Cadet M.D.   On: 04/22/2017 21:33    EKG: Normal sinus rhythm, normal ECG  ASSESSMENT AND PLAN:   1. Recurrent chest pain, nondiagnostic ECG, negative troponin 2. Severe three-vessel coronary artery disease, with diffuse distal native disease, with recent cardiac catheterization revealing patent LIMA to LAD, patent SVG to OM1, with occluded SVG to RCA, 7 months after initial bypass surgery. We discussed in detail, the patient is likely experiencing expected chronic stable angina in light of this coronary anatomy.  Recommendations  1. Agree with current therapy 2. Add Ranexa 500 mg twice a day. The patient reports he did well Ranexa prior to bypass surgery or treatment of chronic stable angina with known diffuse distal native disease. 3. Postpone repeat cardiac catheterization at this time 4. Follow-up with Dr. Nehemiah Massed in one week  Signed: Isaias Cowman MD,PhD, [facility] 04/23/2017, 12:46 PM

## 2017-04-23 NOTE — Progress Notes (Signed)
Patient was admitted about 0100. Patient is NPO for a catherization. Patient complained of a headache. Spouse is at the bedside. Will continue to monitor and assess.

## 2017-04-23 NOTE — Progress Notes (Signed)
Discharge instructions along with home medication list and follow up gone over with patient and wife, both verbalized that they understood instructions. Printed rx given to patient. Iv removed x1, telemetry returned. No c/o pain no distress noted. Patient to be discharged home on ra.

## 2017-04-24 LAB — HEMOGLOBIN A1C
HEMOGLOBIN A1C: 7.8 % — AB (ref 4.8–5.6)
Mean Plasma Glucose: 177 mg/dL

## 2017-04-27 ENCOUNTER — Ambulatory Visit (INDEPENDENT_AMBULATORY_CARE_PROVIDER_SITE_OTHER): Payer: Medicare Other | Admitting: Primary Care

## 2017-04-27 ENCOUNTER — Encounter: Payer: Self-pay | Admitting: Primary Care

## 2017-04-27 DIAGNOSIS — E1159 Type 2 diabetes mellitus with other circulatory complications: Secondary | ICD-10-CM

## 2017-04-27 DIAGNOSIS — Z794 Long term (current) use of insulin: Secondary | ICD-10-CM | POA: Diagnosis not present

## 2017-04-27 DIAGNOSIS — E118 Type 2 diabetes mellitus with unspecified complications: Secondary | ICD-10-CM

## 2017-04-27 MED ORDER — INSULIN GLARGINE 100 UNIT/ML ~~LOC~~ SOLN: 24.0000 [IU] | Freq: Every day | SUBCUTANEOUS | 1 refills | Status: DC

## 2017-04-27 NOTE — Patient Instructions (Signed)
Follow up with cardiology as scheduled.   Continue Ranexa 500 mg tablets twice daily.  It was a pleasure to see you today!

## 2017-04-27 NOTE — Progress Notes (Signed)
Subjective:    Patient ID: Lawrence [name], male    DOB: 1951/01/24, 66 y.o.   MRN: [id number]  HPI  Lawrence [name] is a 66 year old male who presents today for [facility] Follow up. He has a history of CAD with angina, type 2 diabetes, hyperlipidemia, essential hypertension.   He presented to [facility] ED on 04/22/17 with a chief complaint of chest pain. He developed sudden, severe substernal, non radiating chest pain shortly after eating that was unrelieved despite three nitroglycerin tablets at home so he presented to the emergency department. He denied nausea/vomiting/cough. At that time he was scheduled to undergo cardiac catheterization in the outpatient setting this week. Prior catheterization showed 95% blockage and occlusion of his CABG. During his stay in the ED his ECG was without any acute ischemic changes. Given risk factors and symptoms he was admitted for monitoring and further evaluation.  During his stay in the hospital he underwent labs (TSH, lipids, serial troponin levels.) He was evaluated by cardiology who suspected his chest pain to be expected chronic stable angina in light of coronary anatomy. Cardiology added Ranexa 500 mg BID and recommended they delay his cardiac catheterization until after outpatient follow up post discharge. His serial troponin levels remained negative, other labs stable. He was discharged home on 04/23/17 with recommendations for PCP and cardiology follow up.  Since his discharge home he's doing well. He denies chest pain. He is due to see his cardiologist July 9th. He is compliant to his Ranexa.  Review of Systems  Constitutional: Negative for fatigue.  Respiratory: Negative for shortness of breath.   Cardiovascular: Negative for chest pain and palpitations.  Neurological: Negative for dizziness and weakness.       Past Medical History:  Diagnosis Date  . CAD (coronary artery disease)    07/2007  . Diabetes ([place])   . Elevated lipids   . Glaucoma     . HBP (high blood pressure)   . Neuropathy   . Osteoarthritis   . Skin cancer   . Sleep apnea    CPAP     Social History   Social History  . Marital status: Married    Spouse name: N/A  . Number of children: N/A  . Years of education: N/A   Occupational History  . Not on file.   Social History Main Topics  . Smoking status: Former Smoker    Packs/day: 1.00    Years: 16.00    Types: Cigarettes    Start date: 11/03/1969    Quit date: 07/22/1986  . Smokeless tobacco: Never Used  . Alcohol use 0.0 oz/week     Comment: OCCASIONALLY  . Drug use: No  . Sexual activity: No   Other Topics Concern  . Not on file   Social History Narrative   Married.  Lives at home with wife.     Past Surgical History:  Procedure Laterality Date  . APPENDECTOMY    . CARDIAC CATHETERIZATION N/A 06/11/2016   Procedure: Left Heart Cath and Coronary Angiography;  Surgeon: Corey Skains, MD;  Location: [place] CV LAB;  Service: Cardiovascular;  Laterality: N/A;  . CARPAL TUNNEL RELEASE Left 04/24/2016   Procedure: CARPAL TUNNEL RELEASE;  Surgeon: Hessie Knows, MD;  Location: [facility] ORS;  Service: Orthopedics;  Laterality: Left;  . CATARACT EXTRACTION W/ INTRAOCULAR LENS  IMPLANT, BILATERAL Bilateral   . CATARACT EXTRACTION, BILATERAL    . CORONARY ARTERY BYPASS GRAFT N/A 06/27/2016   Procedure: CORONARY  ARTERY BYPASS GRAFTING times four using left internal mammary artery and right leg saphenous vein;  Surgeon: Ivin Poot, MD;  Location: [place];  Service: Open Heart Surgery;  Laterality: N/A;  . EYE SURGERY Bilateral   . JOINT REPLACEMENT    . KNEE ARTHROPLASTY Left 02/11/2016   Procedure: COMPUTER ASSISTED TOTAL KNEE ARTHROPLASTY;  Surgeon: Dereck Leep, MD;  Location: [facility] ORS;  Service: Orthopedics;  Laterality: Left;  . KNEE ARTHROSCOPY Right   . LEFT HEART CATH AND CORS/GRAFTS ANGIOGRAPHY Left 01/13/2017   Procedure: Left Heart Cath and Cors/Grafts Angiography;  Surgeon: Corey Skains, MD;  Location: [place] CV LAB;  Service: Cardiovascular;  Laterality: Left;  . TEE WITHOUT CARDIOVERSION N/A 06/27/2016   Procedure: TRANSESOPHAGEAL ECHOCARDIOGRAM (TEE);  Surgeon: Ivin Poot, MD;  Location: [place];  Service: Open Heart Surgery;  Laterality: N/A;    Family History  Problem Relation Age of Onset  . Drug abuse Brother   . Hypertension Mother   . Lupus Mother   . Hypertension Father   . Diabetes Father   . Heart attack Father 53       died in his 59s  . Arthritis Maternal Grandmother   . Arthritis Maternal Grandfather   . Arthritis Paternal Grandmother   . Arthritis Paternal Grandfather   . Heart disease Brother     Allergies  Allergen Reactions  . Nitroglycerin Nausea Only and Other (See Comments)    Patches only - headache  . Statins Other (See Comments)    BODY PAIN   . Lipitor [Atorvastatin] Other (See Comments) and Rash    BODY PAIN  . Lisinopril Other (See Comments)    Light headed, dizzy    Current Outpatient Prescriptions on File Prior to Visit  Medication Sig Dispense Refill  . aspirin 81 MG EC tablet Take 81 mg by mouth daily. Swallow whole.    . cholecalciferol (VITAMIN D) 1000 units tablet Take 1,000 Units by mouth daily.    . clopidogrel (PLAVIX) 75 MG tablet Take 1 tablet (75 mg total) by mouth daily. 30 tablet 0  . docusate sodium (COLACE) 100 MG capsule Take 200 mg by mouth 2 (two) times daily.    . fexofenadine (ALLEGRA) 180 MG tablet Take 180 mg by mouth daily.    . furosemide (LASIX) 40 MG tablet Take 1 tablet (40 mg total) by mouth daily. 30 tablet 1  . glucagon (GLUCAGON EMERGENCY) 1 MG injection Inject 1 mg into the vein once as needed. Reported on 02/11/2016    . insulin aspart (NOVOLOG) 100 UNIT/ML injection Inject 20 Units into the skin 3 (three) times daily. (Patient taking differently: Inject 20 Units into the skin 3 (three) times daily with meals. ) 10 mL 5  . isosorbide mononitrate (IMDUR) 30 MG 24 hr tablet Take  30 mg by mouth daily.    [name] latanoprost (XALATAN) 0.005 % ophthalmic solution Place 1 drop into both eyes at bedtime.    . metoprolol tartrate (LOPRESSOR) 25 MG tablet Take 25 mg by mouth 2 (two) times daily.    . Multiple Vitamin (MULTIVITAMIN) capsule Take 1 capsule by mouth daily.    . nitroGLYCERIN (NITROSTAT) 0.4 MG SL tablet Place 1 tablet (0.4 mg total) under the tongue every 5 (five) minutes as needed for chest pain. 50 tablet 1  . potassium chloride SA (K-DUR,KLOR-CON) 20 MEQ tablet Take 1 tablet (20 mEq total) by mouth daily. 30 tablet 1  . ranolazine (RANEXA) 500 MG  12 hr tablet Take 1 tablet (500 mg total) by mouth 2 (two) times daily. 60 tablet 0  . rosuvastatin (CRESTOR) 20 MG tablet Take 1 tablet (20 mg total) by mouth daily at 6 PM. 30 tablet 1  . vitamin C (ASCORBIC ACID) 500 MG tablet Take 500 mg by mouth 2 (two) times daily.      No current facility-administered medications on file prior to visit.     BP 136/74   Pulse 66   Temp 97.8 F (36.6 C) (Oral)   Ht 5\' 11"  (1.803 m)   Wt (!) 302 lb 12.8 oz (137.3 kg)   SpO2 98%   BMI 42.23 kg/m    Objective:   Physical Exam  Constitutional: He appears well-nourished.  Neck: Neck supple.  Cardiovascular: Normal rate, regular rhythm and normal heart sounds.   Pulmonary/Chest: Effort normal and breath sounds normal.  Skin: Skin is warm and dry.          Assessment & Plan:  [facility] Follow Up:  Presented to [facility] ED on 04/22/17 for acute chest pain. Admitted for further evaluation given history. Hospital stay unremarkable without evidence of acute ischemia. Catheterization was delayed until after evaluation in the outpatient setting. Cardiology office visit scheduled for July 9th. He has been pain free since 04/22/17. Continue Ranexa. Follow up with cardiology as scheduled.  All hospital labs, notes, imaging reviewed. Sheral Flow, NP

## 2017-04-28 ENCOUNTER — Ambulatory Visit: Admission: RE | Admit: 2017-04-28 | Payer: Medicare Other | Source: Ambulatory Visit | Admitting: Internal Medicine

## 2017-04-28 ENCOUNTER — Encounter: Admission: RE | Payer: Self-pay | Source: Ambulatory Visit

## 2017-04-28 ENCOUNTER — Ambulatory Visit: Payer: Medicare Other | Admitting: Podiatry

## 2017-04-28 SURGERY — LEFT HEART CATH AND CORONARY ANGIOGRAPHY
Anesthesia: Moderate Sedation | Laterality: Left

## 2017-04-29 ENCOUNTER — Encounter: Payer: Self-pay | Admitting: *Deleted

## 2017-04-29 ENCOUNTER — Ambulatory Visit: Payer: Medicare Other | Admitting: Primary Care

## 2017-04-29 ENCOUNTER — Telehealth: Payer: Self-pay | Admitting: *Deleted

## 2017-04-29 DIAGNOSIS — Z951 Presence of aortocoronary bypass graft: Secondary | ICD-10-CM

## 2017-04-29 DIAGNOSIS — I214 Non-ST elevation (NSTEMI) myocardial infarction: Secondary | ICD-10-CM

## 2017-04-29 NOTE — Telephone Encounter (Signed)
Called to check on status of pt.  He was admitted for chest pain. All labs were negative and they cancelled his cath until after his follow up as outpatient.  He has a follow up appointment on July 9th.  He will need clearance to return to rehab.

## 2017-05-04 ENCOUNTER — Encounter: Payer: Medicare Other | Attending: Cardiovascular Disease

## 2017-05-04 DIAGNOSIS — Z7902 Long term (current) use of antithrombotics/antiplatelets: Secondary | ICD-10-CM | POA: Insufficient documentation

## 2017-05-04 DIAGNOSIS — I214 Non-ST elevation (NSTEMI) myocardial infarction: Secondary | ICD-10-CM | POA: Insufficient documentation

## 2017-05-04 DIAGNOSIS — Z79899 Other long term (current) drug therapy: Secondary | ICD-10-CM | POA: Insufficient documentation

## 2017-05-04 DIAGNOSIS — I2510 Atherosclerotic heart disease of native coronary artery without angina pectoris: Secondary | ICD-10-CM | POA: Insufficient documentation

## 2017-05-04 DIAGNOSIS — Z7982 Long term (current) use of aspirin: Secondary | ICD-10-CM | POA: Insufficient documentation

## 2017-05-04 DIAGNOSIS — E119 Type 2 diabetes mellitus without complications: Secondary | ICD-10-CM | POA: Insufficient documentation

## 2017-05-04 DIAGNOSIS — Z794 Long term (current) use of insulin: Secondary | ICD-10-CM | POA: Insufficient documentation

## 2017-05-04 DIAGNOSIS — Z87891 Personal history of nicotine dependence: Secondary | ICD-10-CM | POA: Insufficient documentation

## 2017-05-08 ENCOUNTER — Ambulatory Visit (INDEPENDENT_AMBULATORY_CARE_PROVIDER_SITE_OTHER): Payer: Medicare Other | Admitting: Podiatry

## 2017-05-08 ENCOUNTER — Encounter: Payer: Self-pay | Admitting: Podiatry

## 2017-05-08 DIAGNOSIS — L603 Nail dystrophy: Secondary | ICD-10-CM | POA: Diagnosis not present

## 2017-05-08 NOTE — Progress Notes (Signed)
   HPI: 66 year old male with a history of diabetes mellitus presents to the office today for evaluation of right great toe changes. Patient states that approximately 2 weeks ago he noticed the blister developed to the distal aspect of the right great toe. Patient believes that over the past 2 weeks that appearance of the toe has improved significantly patient presents today for further treatment and evaluation    Physical Exam: General: The patient is alert and oriented x3 in no acute distress.  Dermatology: There is a hyperkeratotic partially detached toenail noted to the right great toe with periungual debris and mild maceration. Negative for open lesions or macerations.  Vascular: Palpable pedal pulses bilaterally. No edema or erythema noted. Capillary refill within normal limits.  Neurological: Epicritic and protective threshold absent bilaterally.   Musculoskeletal Exam: Range of motion within normal limits to all pedal and ankle joints bilateral. Muscle strength 5/5 in all groups bilateral. History of second digit amputation left foot  Assessment: 1. Right great toenail dystrophy with periungual maceration and debris   Plan of Care:  1. Patient was evaluated. 2. Mechanical debridement of the toenail was performed with debridement of all the periungual debris using a tissue nipper. There were no open lesions noted and no sign of any infectious process 3. Recommend daily application of iodine to the right great toe 2 weeks 4. Return to clinic when necessary  [name], DPM [facility]  Dr. [name], [address]                                        [place], [place] [address]                Office [phone]  Fax [phone]

## 2017-05-11 DIAGNOSIS — I10 Essential (primary) hypertension: Secondary | ICD-10-CM | POA: Diagnosis not present

## 2017-05-11 DIAGNOSIS — Z6841 Body Mass Index (BMI) 40.0 and over, adult: Secondary | ICD-10-CM | POA: Diagnosis not present

## 2017-05-11 DIAGNOSIS — I25708 Atherosclerosis of coronary artery bypass graft(s), unspecified, with other forms of angina pectoris: Secondary | ICD-10-CM | POA: Diagnosis not present

## 2017-05-11 DIAGNOSIS — I6523 Occlusion and stenosis of bilateral carotid arteries: Secondary | ICD-10-CM | POA: Diagnosis not present

## 2017-05-13 ENCOUNTER — Telehealth: Payer: Self-pay

## 2017-05-13 ENCOUNTER — Encounter: Payer: Medicare Other | Admitting: *Deleted

## 2017-05-13 DIAGNOSIS — I214 Non-ST elevation (NSTEMI) myocardial infarction: Secondary | ICD-10-CM | POA: Diagnosis not present

## 2017-05-13 DIAGNOSIS — E119 Type 2 diabetes mellitus without complications: Secondary | ICD-10-CM | POA: Diagnosis not present

## 2017-05-13 DIAGNOSIS — Z7902 Long term (current) use of antithrombotics/antiplatelets: Secondary | ICD-10-CM | POA: Diagnosis not present

## 2017-05-13 DIAGNOSIS — I2510 Atherosclerotic heart disease of native coronary artery without angina pectoris: Secondary | ICD-10-CM | POA: Diagnosis not present

## 2017-05-13 DIAGNOSIS — Z87891 Personal history of nicotine dependence: Secondary | ICD-10-CM | POA: Diagnosis not present

## 2017-05-13 DIAGNOSIS — Z7982 Long term (current) use of aspirin: Secondary | ICD-10-CM | POA: Diagnosis not present

## 2017-05-13 DIAGNOSIS — Z794 Long term (current) use of insulin: Secondary | ICD-10-CM | POA: Diagnosis not present

## 2017-05-13 DIAGNOSIS — Z79899 Other long term (current) drug therapy: Secondary | ICD-10-CM | POA: Diagnosis not present

## 2017-05-13 NOTE — Progress Notes (Signed)
Daily Session Note  Patient Details  Name: Lawrence [name] MRN: [id number] Date of Birth: 26-Jun-1951 Referring Provider:     Cardiac Rehab from 04/01/2017 in [facility] Cardiac and Pulmonary Rehab  Referring Provider  Josephina Gip MD      Encounter Date: 05/13/2017  Check In:     Session Check In - 05/13/17 1655      Check-In   Location [facility]-Cardiac & Pulmonary Rehab   Staff Present Nyoka Cowden, RN, BSN, MA;Susanne Bice, RN, BSN, CCRP;Meredith Sherryll Burger, RN Vickki Hearing, BA, ACSM CEP, Exercise Physiologist   Supervising physician immediately available to respond to emergencies See telemetry face sheet for immediately available ER MD   Medication changes reported     No   Fall or balance concerns reported    No   Warm-up and Cool-down Performed on first and last piece of equipment   Resistance Training Performed Yes   VAD Patient? No     Pain Assessment   Currently in Pain? No/denies         History  Smoking Status  . Former Smoker  . Packs/day: 1.00  . Years: 16.00  . Types: Cigarettes  . Start date: 11/03/1969  . Quit date: 07/22/1986  Smokeless Tobacco  . Never Used    Goals Met:  Proper associated with RPD/PD & O2 Sat Independence with exercise equipment Exercise tolerated well No report of cardiac concerns or symptoms Strength training completed today  Goals Unmet:  Not Applicable  Comments: Pt able to follow exercise prescription today without complaint.  Will continue to monitor for progression.    Dr. Emily Filbert is Medical Director for [place] and [facility].

## 2017-05-13 NOTE — Telephone Encounter (Signed)
Lawrence [name] said Dr Nehemiah Massed cleared him to return to [facility] - will check Epic

## 2017-05-14 DIAGNOSIS — Z951 Presence of aortocoronary bypass graft: Secondary | ICD-10-CM

## 2017-05-14 DIAGNOSIS — I2510 Atherosclerotic heart disease of native coronary artery without angina pectoris: Secondary | ICD-10-CM | POA: Diagnosis not present

## 2017-05-14 DIAGNOSIS — I214 Non-ST elevation (NSTEMI) myocardial infarction: Secondary | ICD-10-CM | POA: Diagnosis not present

## 2017-05-14 DIAGNOSIS — Z7982 Long term (current) use of aspirin: Secondary | ICD-10-CM | POA: Diagnosis not present

## 2017-05-14 DIAGNOSIS — Z79899 Other long term (current) drug therapy: Secondary | ICD-10-CM | POA: Diagnosis not present

## 2017-05-14 DIAGNOSIS — Z794 Long term (current) use of insulin: Secondary | ICD-10-CM | POA: Diagnosis not present

## 2017-05-14 DIAGNOSIS — Z7902 Long term (current) use of antithrombotics/antiplatelets: Secondary | ICD-10-CM | POA: Diagnosis not present

## 2017-05-14 NOTE — Progress Notes (Signed)
Daily Session Note  Patient Details  Name: Lawrence [name] MRN: [id number] Date of Birth: 02-Dec-1950 Referring Provider:     Cardiac Rehab from 04/01/2017 in [facility] Cardiac and Pulmonary Rehab  Referring Provider  ina Gip MD      Encounter Date: 05/14/2017  Check In:     Session Check In - 05/14/17 1623      Check-In   Location [facility]-Cardiac & Pulmonary Rehab   Staff Present Nyoka Cowden, RN, BSN, [place]ita [place], BS, ACSM CEP, Exercise Physiologist; Flavia Shipper   Supervising physician immediately available to respond to emergencies See telemetry face sheet for immediately available ER MD   Medication changes reported     No   Fall or balance concerns reported    No   Tobacco Cessation No Change   Warm-up and Cool-down Performed on first and last piece of equipment   Resistance Training Performed Yes   VAD Patient? No     Pain Assessment   Currently in Pain? No/denies   Multiple Pain Sites No         History  Smoking Status  . Former Smoker  . Packs/day: 1.00  . Years: 16.00  . Types: Cigarettes  . Start date: 11/03/1969  . Quit date: 07/22/1986  Smokeless Tobacco  . Never Used    Goals Met:  Independence with exercise equipment Exercise tolerated well No report of cardiac concerns or symptoms Strength training completed today  Goals Unmet:  Not Applicable  Comments: Pt able to follow exercise prescription today without complaint.  Will continue to monitor for progression.   Dr. Emily Filbert is Medical Director for [place] and [facility].

## 2017-05-18 DIAGNOSIS — Z794 Long term (current) use of insulin: Secondary | ICD-10-CM | POA: Diagnosis not present

## 2017-05-18 DIAGNOSIS — Z951 Presence of aortocoronary bypass graft: Secondary | ICD-10-CM

## 2017-05-18 DIAGNOSIS — Z79899 Other long term (current) drug therapy: Secondary | ICD-10-CM | POA: Diagnosis not present

## 2017-05-18 DIAGNOSIS — I2510 Atherosclerotic heart disease of native coronary artery without angina pectoris: Secondary | ICD-10-CM | POA: Diagnosis not present

## 2017-05-18 DIAGNOSIS — I214 Non-ST elevation (NSTEMI) myocardial infarction: Secondary | ICD-10-CM

## 2017-05-18 DIAGNOSIS — Z7902 Long term (current) use of antithrombotics/antiplatelets: Secondary | ICD-10-CM | POA: Diagnosis not present

## 2017-05-18 DIAGNOSIS — Z7982 Long term (current) use of aspirin: Secondary | ICD-10-CM | POA: Diagnosis not present

## 2017-05-18 LAB — GLUCOSE, CAPILLARY
GLUCOSE-CAPILLARY: 115 mg/dL — AB (ref 65–99)
Glucose-Capillary: 46 mg/dL — ABNORMAL LOW (ref 65–99)
Glucose-Capillary: 49 mg/dL — ABNORMAL LOW (ref 65–99)

## 2017-05-18 NOTE — Progress Notes (Signed)
Daily Session Note  Patient Details  Name: Lawrence [name] MRN: [id number] Date of Birth: Aug 30, 1951 Referring Provider:     Cardiac Rehab from 04/01/2017 in [facility]  Referring Provider  Josephina Gip MD      Encounter Date: 05/18/2017  Check In:     Session Check In - 05/18/17 1702      Check-In   Location [facility]   Staff Present Nada Maclachlan, BA, ACSM CEP, Exercise Physiologist;Kelly Amedeo Plenty, BS, ACSM CEP, Exercise Physiologist;Meredith Sherryll Burger, RN BSN   Supervising physician immediately available to respond to emergencies See telemetry face sheet for immediately available ER MD   Medication changes reported     No   Fall or balance concerns reported    No   Warm-up and Cool-down Performed on first and last piece of equipment   Resistance Training Performed Yes   VAD Patient? No     Pain Assessment   Currently in Pain? No/denies         History  Smoking Status  . Former Smoker  . Packs/day: 1.00  . Years: 16.00  . Types: Cigarettes  . Start date: 11/03/1969  . Quit date: 07/22/1986  Smokeless Tobacco  . Never Used    Goals Met:  Independence with exercise equipment Exercise tolerated well No report of cardiac concerns or symptoms Strength training completed today  Goals Unmet:  Not Applicable  Comments: Pt able to follow exercise prescription today without complaint.  Will continue to monitor for progression.    Dr. Emily Filbert is Medical Director for [place] and [facility].

## 2017-05-20 ENCOUNTER — Encounter: Payer: Self-pay | Admitting: *Deleted

## 2017-05-20 ENCOUNTER — Encounter: Payer: Medicare Other | Admitting: *Deleted

## 2017-05-20 DIAGNOSIS — Z7902 Long term (current) use of antithrombotics/antiplatelets: Secondary | ICD-10-CM | POA: Diagnosis not present

## 2017-05-20 DIAGNOSIS — I214 Non-ST elevation (NSTEMI) myocardial infarction: Secondary | ICD-10-CM | POA: Diagnosis not present

## 2017-05-20 DIAGNOSIS — I2510 Atherosclerotic heart disease of native coronary artery without angina pectoris: Secondary | ICD-10-CM | POA: Diagnosis not present

## 2017-05-20 DIAGNOSIS — Z951 Presence of aortocoronary bypass graft: Secondary | ICD-10-CM

## 2017-05-20 DIAGNOSIS — Z7982 Long term (current) use of aspirin: Secondary | ICD-10-CM | POA: Diagnosis not present

## 2017-05-20 DIAGNOSIS — Z79899 Other long term (current) drug therapy: Secondary | ICD-10-CM | POA: Diagnosis not present

## 2017-05-20 DIAGNOSIS — Z794 Long term (current) use of insulin: Secondary | ICD-10-CM | POA: Diagnosis not present

## 2017-05-20 NOTE — Progress Notes (Signed)
Cardiac Individual Treatment Plan  Patient Details  Name: Lawrence [name] MRN: [id number] Date of Birth: [birth year] Referring Provider:     Cardiac Rehab from 04/01/2017 in [facility] Cardiac and Pulmonary Rehab  Referring Provider  Josephina Gip MD      Initial Encounter Date:    Cardiac Rehab from 04/01/2017 in [facility] Cardiac and Pulmonary Rehab  Date  04/01/17  Referring Provider  Josephina Gip MD      Visit Diagnosis: NSTEMI (non-ST elevated myocardial infarction) ([place])  S/P CABG x 4  Patient's Home Medications on Admission:  Current Outpatient Prescriptions:  .  aspirin 81 MG EC tablet, Take 81 mg by mouth daily. Swallow whole., Disp: , Rfl:  .  cholecalciferol (VITAMIN D) 1000 units tablet, Take 1,000 Units by mouth daily., Disp: , Rfl:  .  clopidogrel (PLAVIX) 75 MG tablet, Take 1 tablet (75 mg total) by mouth daily., Disp: 30 tablet, Rfl: 0 .  docusate sodium (COLACE) 100 MG capsule, Take 200 mg by mouth 2 (two) times daily., Disp: , Rfl:  .  fexofenadine (ALLEGRA) 180 MG tablet, Take 180 mg by mouth daily., Disp: , Rfl:  .  furosemide (LASIX) 40 MG tablet, Take 1 tablet (40 mg total) by mouth daily., Disp: 30 tablet, Rfl: 1 .  glucagon (GLUCAGON EMERGENCY) 1 MG injection, Inject 1 mg into the vein once as needed. Reported on 02/11/2016, Disp: , Rfl:  .  insulin aspart (NOVOLOG) 100 UNIT/ML injection, Inject 20 Units into the skin 3 (three) times daily. (Patient taking differently: Inject 20 Units into the skin 3 (three) times daily with meals. ), Disp: 10 mL, Rfl: 5 .  insulin glargine (LANTUS) 100 UNIT/ML injection, Inject 0.24 mLs (24 Units total) into the skin at bedtime., Disp: 30 mL, Rfl: 1 .  isosorbide mononitrate (IMDUR) 30 MG 24 hr tablet, Take 30 mg by mouth daily., Disp: , Rfl:  .  latanoprost (XALATAN) 0.005 % ophthalmic solution, Place 1 drop into both eyes at bedtime., Disp: , Rfl:  .  metoprolol tartrate (LOPRESSOR) 25 MG tablet, Take 25 mg by mouth 2 (two) times  daily., Disp: , Rfl:  .  Multiple Vitamin (MULTIVITAMIN) capsule, Take 1 capsule by mouth daily., Disp: , Rfl:  .  nitroGLYCERIN (NITROSTAT) 0.4 MG SL tablet, Place 1 tablet (0.4 mg total) under the tongue every 5 (five) minutes as needed for chest pain., Disp: 50 tablet, Rfl: 1 .  potassium chloride SA (K-DUR,KLOR-CON) 20 MEQ tablet, Take 1 tablet (20 mEq total) by mouth daily., Disp: 30 tablet, Rfl: 1 .  ranolazine (RANEXA) 500 MG 12 hr tablet, Take 1 tablet (500 mg total) by mouth 2 (two) times daily., Disp: 60 tablet, Rfl: 0 .  rosuvastatin (CRESTOR) 20 MG tablet, Take 1 tablet (20 mg total) by mouth daily at 6 PM., Disp: 30 tablet, Rfl: 1 .  vitamin C (ASCORBIC ACID) 500 MG tablet, Take 500 mg by mouth 2 (two) times daily. , Disp: , Rfl:   Past Medical History: Past Medical History:  Diagnosis Date  . CAD (coronary artery disease)    07/2007  . Diabetes ([place])   . Elevated lipids   . Glaucoma   . HBP (high blood pressure)   . Neuropathy   . Osteoarthritis   . Skin cancer   . Sleep apnea    CPAP    Tobacco Use: History  Smoking Status  . Former Smoker  . Packs/day: 1.00  . Years: 16.00  . Types: Cigarettes  .  Start date: 11/03/1969  . Quit date: 07/22/1986  Smokeless Tobacco  . Never Used    Labs: Recent Review Flowsheet Data    Labs for ITP Cardiac and Pulmonary Rehab Latest Ref Rng & Units 06/27/2016 06/28/2016 09/08/2016 03/18/2017 04/23/2017   Cholestrol 0 - 200 mg/dL - - 164 143 137   LDLCALC 0 - 99 mg/dL - - 69 63 61   HDL >40 mg/dL - - 81.00 67 67   Trlycerides <150 mg/dL - - 69.0 63 45   Hemoglobin A1c 4.8 - 5.6 % - - 7.9(H) 7.9(H) 7.8(H)   PHART 7.350 - 7.450 7.390 - - - -   PCO2ART 35.0 - 45.0 mmHg 45.2(H) - - - -   HCO3 20.0 - 24.0 mEq/L 27.3(H) - - - -   TCO2 0 - 100 mmol/L 29 26 - - -   O2SAT % 96.0 - - - -       Exercise Target Goals:    Exercise Program Goal: Individual exercise prescription set with THRR, safety & activity barriers. Participant  demonstrates ability to understand and report RPE using BORG scale, to self-measure pulse accurately, and to acknowledge the importance of the exercise prescription.  Exercise Prescription Goal: Starting with aerobic activity 30 plus minutes a day, 3 days per week for initial exercise prescription. Provide home exercise prescription and guidelines that participant acknowledges understanding prior to discharge.  Activity Barriers & Risk Stratification:     Activity Barriers & Cardiac Risk Stratification - 04/01/17 1644      Activity Barriers & Cardiac Risk Stratification   Activity Barriers Arthritis;Joint Problems;Muscular Weakness;Balance Concerns   Cardiac Risk Stratification High      6 Minute Walk:     6 Minute Walk    Row Name 04/01/17 1509         6 Minute Walk   Phase Initial     Distance 1200 feet     Walk Time 6 minutes     # of Rest Breaks 0     MPH 2.27     METS 2.22     RPE 12     VO2 Peak 7.76     Symptoms No     Resting HR 82 bpm     Resting BP 124/70     Max Ex. HR 110 bpm     Max Ex. BP 152/64        Oxygen Initial Assessment:   Oxygen Re-Evaluation:   Oxygen Discharge (Final Oxygen Re-Evaluation):   Initial Exercise Prescription:     Initial Exercise Prescription - 04/01/17 1500      Date of Initial Exercise RX and Referring Provider   Date 04/01/17   Referring Provider Josephina Gip MD     Treadmill   MPH 1.7   Grade 0.5   Minutes 15   METs 2.42     Recumbant Elliptical   Level 1   RPM 50   Minutes 15   METs 2     Biostep-RELP   Level 1   SPM 50   Minutes 15   METs 2     Prescription Details   Frequency (times per week) 3   Duration Progress to 45 minutes of aerobic exercise without signs/symptoms of physical distress     Intensity   THRR 40-80% of Max Heartrate 110-138   Ratings of Perceived Exertion 11-13   Perceived Dyspnea 0-4     Progression   Progression Continue to progress workloads to maintain  intensity without signs/symptoms of physical distress.     Resistance Training   Training Prescription Yes   Weight 3 lbs   Reps 10-15      Perform Capillary Blood Glucose checks as needed.  Exercise Prescription Changes:     Exercise Prescription Changes    Row Name 04/01/17 1500 04/15/17 1500 05/15/17 0900         Response to Exercise   Blood Pressure (Admit) 124/70 140/64 110/58     Blood Pressure (Exercise) 152/64 156/74 144/60     Blood Pressure (Exit)  - 100/70 120/60     Heart Rate (Admit) 82 bpm 107 bpm 48 bpm     Heart Rate (Exercise) 110 bpm 108 bpm 96 bpm     Heart Rate (Exit)  - 76 bpm 81 bpm     Oxygen Saturation (Admit) 97 %  -  -     Oxygen Saturation (Exercise) 97 %  -  -     Rating of Perceived Exertion (Exercise) '12 16 15     ' Symptoms none  - none     Comments walk test results  -  -     Duration  - Progress to 45 minutes of aerobic exercise without signs/symptoms of physical distress Progress to 45 minutes of aerobic exercise without signs/symptoms of physical distress     Intensity  - THRR unchanged THRR unchanged       Progression   Progression  -  - Continue to progress workloads to maintain intensity without signs/symptoms of physical distress.     Average METs  -  - 1.2       Resistance Training   Training Prescription  - Yes No     Weight  - 3 3     Reps  - 10-15 10-15       Treadmill   MPH  - 1.7  -     Grade  - 0.5  -     Minutes  - 15  -     METs  - 2.42  -       Recumbant Elliptical   Level  -  - 1     Minutes  -  - 15     METs  -  - 1.4       Biostep-RELP   Level  - 1 1     SPM  - 50  -     Minutes  - 15 15     METs  -  - 1        Exercise Comments:     Exercise Comments    Row Name 04/06/17 1754 04/09/17 1719 04/15/17 1531       Exercise Comments First full day of exercise!  Lawrence [name] was oriented to gym and equipment including functions, settings, policies, and procedures.  Lawrence [name]'s individual exercise prescription and  treatment plan were reviewed.  All starting workloads were established based on the results of the 6 minute walk test done at initial orientation visit.  The plan for exercise progression was also introduced and progression will be customized based on patient's performance and goals. [place] had chest pain during exercise.  It resolved with rest and he completed the session with no other issues. Lawrence [name] tolerated exercise well last session.  Staff will continue to monitor progress.        Exercise Goals and Review:     Exercise Goals    Row Name 04/01/17 [phone]  Exercise Goals   Increase Physical Activity Yes       Intervention Provide advice, education, support and counseling about physical activity/exercise needs.;Develop an individualized exercise prescription for aerobic and resistive training based on initial evaluation findings, risk stratification, comorbidities and participant's personal goals.       Expected Outcomes Achievement of increased cardiorespiratory fitness and enhanced flexibility, muscular endurance and strength shown through measurements of functional capacity and personal statement of participant.       Increase Strength and Stamina Yes       Intervention Provide advice, education, support and counseling about physical activity/exercise needs.;Develop an individualized exercise prescription for aerobic and resistive training based on initial evaluation findings, risk stratification, comorbidities and participant's personal goals.       Expected Outcomes Achievement of increased cardiorespiratory fitness and enhanced flexibility, muscular endurance and strength shown through measurements of functional capacity and personal statement of participant.          Exercise Goals Re-Evaluation :     Exercise Goals Re-Evaluation    [place] Name 04/29/17 1354 05/15/17 0935           Exercise Goal Re-Evaluation   Exercise Goals Review  - Increase Physical  Activity;Increase Strenth and Stamina      Comments Out since last review. Lawrence [name] is back after missing sessions.  He has tolerated exercise well and is encouraged by his doctor to exercise.      Expected Outcomes  - [place] will be able to attend class on a regular basis.  [place] will build strength and stamina.         Discharge Exercise Prescription (Final Exercise Prescription Changes):     Exercise Prescription Changes - 05/15/17 0900      Response to Exercise   Blood Pressure (Admit) 110/58   Blood Pressure (Exercise) 144/60   Blood Pressure (Exit) 120/60   Heart Rate (Admit) 48 bpm   Heart Rate (Exercise) 96 bpm   Heart Rate (Exit) 81 bpm   Rating of Perceived Exertion (Exercise) 15   Symptoms none   Duration Progress to 45 minutes of aerobic exercise without signs/symptoms of physical distress   Intensity THRR unchanged     Progression   Progression Continue to progress workloads to maintain intensity without signs/symptoms of physical distress.   Average METs 1.2     Resistance Training   Training Prescription No   Weight 3   Reps 10-15     Recumbant Elliptical   Level 1   Minutes 15   METs 1.4     Biostep-RELP   Level 1   Minutes 15   METs 1      Nutrition:  Target Goals: Understanding of nutrition guidelines, daily intake of sodium <1568m, cholesterol <2015m calories 30% from fat and 7% or less from saturated fats, daily to have 5 or more servings of fruits and vegetables.  Biometrics:     Pre Biometrics - 04/01/17 1513      Pre Biometrics   Height 5' 10.8" (1.798 m)   Weight (!)  300 lb 1.6 oz (136.1 kg)   Waist Circumference 50.25 inches   Hip Circumference 54.5 inches   Waist to Hip Ratio 0.92 %   BMI (Calculated) 42.2   Single Leg Stand 1.43 seconds       Nutrition Therapy Plan and Nutrition Goals:     Nutrition Therapy & Goals - 04/01/17 1644      Nutrition Therapy   RD  appointment defered Yes   Drug/Food Interactions  Statins/Certain Fruits      Nutrition Discharge: Rate Your Plate Scores:     Nutrition Assessments - 04/01/17 1639      MEDFICTS Scores   Pre Score 67      Nutrition Goals Re-Evaluation:   Nutrition Goals Discharge (Final Nutrition Goals Re-Evaluation):   Psychosocial: Target Goals: Acknowledge presence or absence of significant depression and/or stress, maximize coping skills, provide positive support system. Participant is able to verbalize types and ability to use techniques and skills needed for reducing stress and depression.   Initial Review & Psychosocial Screening:     Initial Psych Review & Screening - 04/01/17 1646      Initial Review   Current issues with Current Sleep Concerns     Family Dynamics   Good Support System? Yes     Barriers   Psychosocial barriers to participate in program The patient should benefit from training in stress management and relaxation.     Screening Interventions   Interventions Encouraged to exercise      Quality of Life Scores:      Quality of Life - 04/01/17 1629      Quality of Life Scores   Health/Function Pre 11.2 %   Socioeconomic Pre 22.75 %   Psych/Spiritual Pre 19.5 %   Family Pre 25.5 %   GLOBAL Pre 16.97 %      PHQ-9: Recent Review Flowsheet Data    Depression screen [facility] 2/9 04/01/2017 12/12/2016 09/11/2016 09/05/2016   Decreased Interest 1 1 0 0   Down, Depressed, Hopeless '1 1 1 ' 0   PHQ - 2 Score '2 2 1 ' 0   Altered sleeping '1 3 1 ' -   Tired, decreased energy '1 1 1 ' -   Change in appetite 2 1 0 -   Feeling bad or failure about yourself  '2 1 1 ' -   Trouble concentrating '1 1 1 ' -   Moving slowly or fidgety/restless '1 1 1 ' -   Suicidal thoughts 0 0 0 -   PHQ-9 Score '10 10 6 ' -   Difficult doing work/chores Somewhat difficult Somewhat difficult Somewhat difficult -     Interpretation of Total Score  Total Score Depression Severity:  1-4 = Minimal depression, 5-9 = Mild depression, 10-14 = Moderate depression,  15-19 = Moderately severe depression, 20-27 = Severe depression   Psychosocial Evaluation and Intervention:     Psychosocial Evaluation - 04/06/17 1748      Psychosocial Evaluation & Interventions   Interventions Stress management education;Relaxation education;Encouraged to exercise with the program and follow exercise prescription   Comments Counselor met with Lawrence [name] [facility]) today for return to this Cardiac Rehab program subsequent to another heart attack several weeks ago.  He continues to have a strong support system with his spouse; and some wonderful neighbors who are like family to him.  He also is actively involved in his local church.  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  Lawrence [name] also had his left toe amputated several months ago.  He states he sleeps well with his CPAP and has a good appetite.  He has no "official" diagnosis of depression or anxiety, but he admits he is struggling lately with a PHQ-9 of "10" and he was tearful at times talking to this counselor.  Lawrence [name] reports his greatest stressor is his  health.  He has goals to get stronger overall and have less shortness of breath.   Counselor  commended Lawrence [name] for coming back into this program and his commitment to consistent exercise to reach his stated goals.  He will be followed by staff throughout the course of this program.    Expected [place] will benefit from consistent exercise to achieve his stated goals.  He will also benefit from the psychoeducational components of this program to help with his mood and managing stress in his life.  Lawrence [name] may need to see a counselor if these symptoms do now improve in the near future.     Continue Psychosocial Services  Follow up required by staff      Psychosocial Re-Evaluation:   Psychosocial Discharge (Final Psychosocial  Re-Evaluation):   Vocational Rehabilitation: Provide vocational rehab assistance to qualifying candidates.   Vocational Rehab Evaluation & Intervention:     Vocational Rehab - 04/01/17 1635      Initial Vocational Rehab Evaluation & Intervention   Assessment shows need for Vocational Rehabilitation No      Education: Education Goals: Education classes will be provided on a weekly basis, covering required topics. Participant will state understanding/return demonstration of topics presented.  Learning Barriers/Preferences:     Learning Barriers/Preferences - 04/01/17 1634      Learning Barriers/Preferences   Learning Barriers Sight   Learning Preferences None      Education Topics: General Nutrition Guidelines/Fats and Fiber: -Group instruction provided by verbal, written material, models and posters to present the general guidelines for heart healthy nutrition. Gives an explanation and review of dietary fats and fiber.   Controlling Sodium/Reading Food Labels: -Group verbal and written material supporting the discussion of sodium use in heart healthy nutrition. Review and explanation with models, verbal and written materials for utilization of the food label.   Cardiac Rehab from 12/15/2016 in [facility] Cardiac and Pulmonary Rehab  Date  09/22/16  Educator  PI  Instruction Review Code  2- meets goals/outcomes      Exercise Physiology & Risk Factors: - Group verbal and written instruction with models to review the exercise physiology of the cardiovascular system and associated critical values. Details cardiovascular disease risk factors and the goals associated with each risk factor.   Cardiac Rehab from 12/15/2016 in [facility] Cardiac and Pulmonary Rehab  Date  11/24/16  Educator  [facility]  Instruction Review Code  2- meets goals/outcomes      Aerobic Exercise & Resistance Training: - Gives group verbal and written discussion on the health impact of inactivity. On the components of  aerobic and resistive training programs and the benefits of this training and how to safely progress through these programs.   Cardiac Rehab from 05/18/2017 in [facility] Cardiac and Pulmonary Rehab  Date  05/13/17  Educator  AS  Instruction Review Code  2- meets goals/outcomes      Flexibility, Balance, General Exercise Guidelines: - Provides group verbal and written instruction on the benefits of flexibility and balance training programs. Provides general exercise guidelines with specific guidelines to those with heart or lung disease. Demonstration and skill practice provided.   Cardiac Rehab from 05/18/2017 in [facility] Cardiac and Pulmonary Rehab  Date  05/18/17  Educator  AS  Instruction Review Code  2- meets goals/outcomes      Stress Management: - Provides group verbal and written instruction about the health risks of elevated stress, cause of high stress, and healthy ways to reduce stress.   Cardiac Rehab  from 12/15/2016 in [facility] Cardiac and Pulmonary Rehab  Date  12/10/16  Educator  [facility]  Instruction Review Code  2- meets goals/outcomes      Depression: - Provides group verbal and written instruction on the correlation between heart/lung disease and depressed mood, treatment options, and the stigmas associated with seeking treatment.   Cardiac Rehab from 12/15/2016 in [facility] Cardiac and Pulmonary Rehab  Date  09/17/16  Educator  Berle Mull, MSW  Instruction Review Code  2- meets goals/outcomes      Anatomy & Physiology of the Heart: - Group verbal and written instruction and models provide basic cardiac anatomy and physiology, with the coronary electrical and arterial systems. Review of: AMI, Angina, Valve disease, Heart Failure, Cardiac Arrhythmia, Pacemakers, and the ICD.   Cardiac Rehab from 12/15/2016 in [facility] Cardiac and Pulmonary Rehab  Date  12/08/16  Educator  C. [place]  Instruction Review Code  2- meets goals/outcomes      Cardiac Procedures: - Group verbal and  written instruction and models to describe the testing methods done to diagnose heart disease. Reviews the outcomes of the test results. Describes the treatment choices: Medical Management, Angioplasty, or Coronary Bypass Surgery.   Cardiac Rehab from 05/18/2017 in [facility] Cardiac and Pulmonary Rehab  Date  04/06/17  Educator  SB  Instruction Review Code  2- meets goals/outcomes      Cardiac Medications: - Group verbal and written instruction to review commonly prescribed medications for heart disease. Reviews the medication, class of the drug, and side effects. Includes the steps to properly store meds and maintain the prescription regimen.   Cardiac Rehab from 05/18/2017 in [facility] Cardiac and Pulmonary Rehab  Date  04/13/17  Educator  SB  Instruction Review Code  2- meets goals/outcomes      Go Sex-Intimacy & Heart Disease, Get SMART - Goal Setting: - Group verbal and written instruction through game format to discuss heart disease and the return to sexual intimacy. Provides group verbal and written material to discuss and apply goal setting through the application of the S.M.A.R.T. Method.   Cardiac Rehab from 05/18/2017 in [facility] Cardiac and Pulmonary Rehab  Date  04/06/17  Educator  SB  Instruction Review Code  2- meets goals/outcomes      Other Matters of the Heart: - Provides group verbal, written materials and models to describe Heart Failure, Angina, Valve Disease, and Diabetes in the realm of heart disease. Includes description of the disease process and treatment options available to the cardiac patient.   Cardiac Rehab from 12/15/2016 in [facility] Cardiac and Pulmonary Rehab  Date  12/08/16  Educator  C. [place]  Instruction Review Code  2- meets goals/outcomes      Exercise & Equipment Safety: - Individual verbal instruction and demonstration of equipment use and safety with use of the equipment.   Cardiac Rehab from 05/18/2017 in [facility] Cardiac and Pulmonary Rehab  Date  04/01/17   Educator  C. EnterkinRN  Instruction Review Code  1- partially meets, needs review/practice      Infection Prevention: - Provides verbal and written material to individual with discussion of infection control including proper hand washing and proper equipment cleaning during exercise session.   Cardiac Rehab from 05/18/2017 in [facility] Cardiac and Pulmonary Rehab  Date  04/01/17  Educator  C. EnterkinRN  Instruction Review Code  2- meets goals/outcomes      Falls Prevention: - Provides verbal and written material to individual with discussion of falls prevention and safety.  Cardiac Rehab from 05/18/2017 in [facility] Cardiac and Pulmonary Rehab  Date  04/01/17  Educator  C. [place]  Instruction Review Code  2- meets goals/outcomes      Diabetes: - Individual verbal and written instruction to review signs/symptoms of diabetes, desired ranges of glucose level fasting, after meals and with exercise. Advice that pre and post exercise glucose checks will be done for 3 sessions at entry of program.   Cardiac Rehab from 05/18/2017 in [facility] Cardiac and Pulmonary Rehab  Date  04/01/17  Educator  C. [place]  Instruction Review Code  1- partially meets, needs review/practice       Knowledge Questionnaire Score:     Knowledge Questionnaire Score - 04/01/17 1634      Knowledge Questionnaire Score   Pre Score 16      Core Components/Risk Factors/Patient Goals at Admission:     Personal Goals and Risk Factors at Admission - 04/01/17 1645      Core Components/Risk Factors/Patient Goals on Admission   Admit Weight 300 lb (136.1 kg)   Goal Weight: Short Term 295 lb (133.8 kg)   Goal Weight: Long Term 220 lb (99.8 kg)   Expected Outcomes Short Term: Continue to assess and modify interventions until short term weight is achieved;Weight Loss: Understanding of general recommendations for a balanced deficit meal plan, which promotes 1-2 lb weight loss per week and includes a negative energy  balance of [phone] kcal/d   Diabetes Yes   Intervention Provide education about signs/symptoms and action to take for hypo/hyperglycemia.;Provide education about proper nutrition, including hydration, and aerobic/resistive exercise prescription along with prescribed medications to achieve blood glucose in normal ranges: Fasting glucose 65-99 mg/dL   Expected Outcomes Short Term: Participant verbalizes understanding of the signs/symptoms and immediate care of hyper/hypoglycemia, proper foot care and importance of medication, aerobic/resistive exercise and nutrition plan for blood glucose control.;Long Term: Attainment of HbA1C < 7%.   Hypertension Yes   Intervention Provide education on lifestyle modifcations including regular physical activity/exercise, weight management, moderate sodium restriction and increased consumption of fresh fruit, vegetables, and low fat dairy, alcohol moderation, and smoking cessation.;Monitor prescription use compliance.   Expected Outcomes Short Term: Continued assessment and intervention until BP is < 140/33m HG in hypertensive participants. < 130/833mHG in hypertensive participants with diabetes, heart failure or chronic kidney disease.;Long Term: Maintenance of blood pressure at goal levels.   Lipids Yes   Intervention Provide education and support for participant on nutrition & aerobic/resistive exercise along with prescribed medications to achieve LDL <7023mHDL >32m70m Expected Outcomes Short Term: Participant states understanding of desired cholesterol values and is compliant with medications prescribed. Participant is following exercise prescription and nutrition guidelines.;Long Term: Cholesterol controlled with medications as prescribed, with individualized exercise RX and with personalized nutrition plan. Value goals: LDL < 70mg6mL > 40 mg.      Core Components/Risk Factors/Patient Goals Review:    Core Components/Risk Factors/Patient Goals at Discharge  (Final Review):    ITP Comments:     ITP Comments    Row Name 04/01/17 1643 04/09/17 1720 04/14/17 1209 04/22/17 0556 04/29/17 1353   ITP Comments  Medical review started after informed consent was signed for Cardiac REhab then ITP was created. Diagnosis in CHL/EPIC. [place] pain during exercise.  It resolved with rest and he completed the session with no other issues. Azir continues to have angina symptoms with exercise and at home at rest.  Reviewing his meds with him, we  discovered he ahd been on Renexa and has been off since one of his hospitalizations.  I advised Lawrence [name] to call his physician and ask about resumong the Renexa for possible symptom control.  30 day review. Continue with ITP unless directed changes per Medical Director review Called to check on status of pt.  He was admitted for chest pain. All labs were negative and they cancelled his cath until after his follow up as outpatient.  He has a follow up appointment on July 9th.  He will need clearance to return to rehab.   [place] Name 05/20/17 0655           ITP Comments 30 day review. Continue with ITP unless directed changes per Medical Director review     Med changes have helped with symptom control per [place]          Comments:

## 2017-05-20 NOTE — Progress Notes (Signed)
Daily Session Note  Patient Details  Name: Lawrence [name] MRN: [id number] Date of Birth: 1951-01-16 Referring Provider:     Cardiac Rehab from 04/01/2017 in [facility] Cardiac and Pulmonary Rehab  Referring Provider  Josephina Gip MD      Encounter Date: 05/20/2017  Check In:     Session Check In - 05/20/17 1614      Check-In   Location [facility]-Cardiac & Pulmonary Rehab   Staff Present Nyoka Cowden, RN, BSN, MA;Meredith Sherryll Burger, RN Vickki Hearing, BA, ACSM CEP, Exercise Physiologist   Supervising physician immediately available to respond to emergencies See telemetry face sheet for immediately available ER MD   Medication changes reported     No   Fall or balance concerns reported    No   Warm-up and Cool-down Performed on first and last piece of equipment   Resistance Training Performed Yes   VAD Patient? No     Pain Assessment   Currently in Pain? No/denies         History  Smoking Status  . Former Smoker  . Packs/day: 1.00  . Years: 16.00  . Types: Cigarettes  . Start date: 11/03/1969  . Quit date: 07/22/1986  Smokeless Tobacco  . Never Used    Goals Met:  Proper associated with RPD/PD & O2 Sat Independence with exercise equipment Exercise tolerated well No report of cardiac concerns or symptoms Strength training completed today  Goals Unmet:  Not Applicable  Comments: Pt able to follow exercise prescription today without complaint.  Will continue to monitor for progression.    Dr. Emily Filbert is Medical Director for [place] and [facility].

## 2017-05-21 DIAGNOSIS — I214 Non-ST elevation (NSTEMI) myocardial infarction: Secondary | ICD-10-CM

## 2017-05-21 DIAGNOSIS — Z79899 Other long term (current) drug therapy: Secondary | ICD-10-CM | POA: Diagnosis not present

## 2017-05-21 DIAGNOSIS — Z951 Presence of aortocoronary bypass graft: Secondary | ICD-10-CM

## 2017-05-21 DIAGNOSIS — Z7902 Long term (current) use of antithrombotics/antiplatelets: Secondary | ICD-10-CM | POA: Diagnosis not present

## 2017-05-21 DIAGNOSIS — Z794 Long term (current) use of insulin: Secondary | ICD-10-CM | POA: Diagnosis not present

## 2017-05-21 DIAGNOSIS — I2510 Atherosclerotic heart disease of native coronary artery without angina pectoris: Secondary | ICD-10-CM | POA: Diagnosis not present

## 2017-05-21 DIAGNOSIS — Z7982 Long term (current) use of aspirin: Secondary | ICD-10-CM | POA: Diagnosis not present

## 2017-05-21 NOTE — Progress Notes (Signed)
Daily Session Note  Patient Details  Name: Lawrence [name] MRN: [id number] Date of Birth: 13-Nov-1950 Referring Provider:     Cardiac Rehab from 04/01/2017 in [facility] Cardiac and Pulmonary Rehab  Referring Provider  Josephina Gip MD      Encounter Date: 05/21/2017  Check In:     Session Check In - 05/21/17 1633      Check-In   Location [facility]-Cardiac & Pulmonary Rehab   Staff Present Earlean Shawl, BS, ACSM CEP, Exercise Physiologist;Joseph Alcus Dad, RN BSN   Supervising physician immediately available to respond to emergencies See telemetry face sheet for immediately available ER MD   Medication changes reported     No   Fall or balance concerns reported    No   Tobacco Cessation No Change   Warm-up and Cool-down Performed on first and last piece of equipment   Resistance Training Performed Yes   VAD Patient? No     Pain Assessment   Currently in Pain? No/denies   Multiple Pain Sites No         History  Smoking Status  . Former Smoker  . Packs/day: 1.00  . Years: 16.00  . Types: Cigarettes  . Start date: 11/03/1969  . Quit date: 07/22/1986  Smokeless Tobacco  . Never Used    Goals Met:  Independence with exercise equipment Exercise tolerated well No report of cardiac concerns or symptoms Strength training completed today  Goals Unmet:  Not Applicable  Comments: Pt able to follow exercise prescription today without complaint.  Will continue to monitor for progression.   Dr. Emily Filbert is Medical Director for [place] and [facility].

## 2017-05-25 ENCOUNTER — Telehealth: Payer: Self-pay

## 2017-05-25 NOTE — Telephone Encounter (Signed)
Lawrence [name] will not attend class today - he has had some chest pain and his BG is out of whack.

## 2017-05-27 ENCOUNTER — Encounter: Payer: Medicare Other | Admitting: *Deleted

## 2017-05-27 DIAGNOSIS — Z794 Long term (current) use of insulin: Secondary | ICD-10-CM | POA: Diagnosis not present

## 2017-05-27 DIAGNOSIS — I214 Non-ST elevation (NSTEMI) myocardial infarction: Secondary | ICD-10-CM

## 2017-05-27 DIAGNOSIS — I2510 Atherosclerotic heart disease of native coronary artery without angina pectoris: Secondary | ICD-10-CM | POA: Diagnosis not present

## 2017-05-27 DIAGNOSIS — Z7982 Long term (current) use of aspirin: Secondary | ICD-10-CM | POA: Diagnosis not present

## 2017-05-27 DIAGNOSIS — Z7902 Long term (current) use of antithrombotics/antiplatelets: Secondary | ICD-10-CM | POA: Diagnosis not present

## 2017-05-27 DIAGNOSIS — Z79899 Other long term (current) drug therapy: Secondary | ICD-10-CM | POA: Diagnosis not present

## 2017-05-27 NOTE — Progress Notes (Signed)
Daily Session Note  Patient Details  Name: Lawrence [name] MRN: [id number] Date of Birth: [birth year] Referring Provider:     Cardiac Rehab from 04/01/2017 in [facility] Cardiac and Pulmonary Rehab  Referring Provider  Josephina Gip MD      Encounter Date: 05/27/2017  Check In:     Session Check In - 05/27/17 1623      Check-In   Location [facility]-Cardiac & Pulmonary Rehab   Staff Present Renita Papa, RN Vickki Hearing, BA, ACSM CEP, Exercise Physiologist;Carroll Enterkin, RN, BSN   Supervising physician immediately available to respond to emergencies See telemetry face sheet for immediately available ER MD   Medication changes reported     No   Fall or balance concerns reported    No   Warm-up and Cool-down Performed on first and last piece of equipment   Resistance Training Performed Yes   VAD Patient? No     Pain Assessment   Currently in Pain? No/denies           Exercise Prescription Changes - 05/27/17 1100      Response to Exercise   Blood Pressure (Admit) 104/58   Blood Pressure (Exercise) 150/76   Blood Pressure (Exit) 114/62   Heart Rate (Admit) 67 bpm   Heart Rate (Exercise) 95 bpm   Heart Rate (Exit) 79 bpm   Rating of Perceived Exertion (Exercise) 15   Symptoms none   Duration Progress to 45 minutes of aerobic exercise without signs/symptoms of physical distress   Intensity THRR unchanged     Progression   Progression Continue to progress workloads to maintain intensity without signs/symptoms of physical distress.   Average METs 2.2     Resistance Training   Training Prescription Yes   Weight 3   Reps 10-15     Interval Training   Interval Training No     Treadmill   MPH 1.7   Grade 0.5   Minutes 15   METs 2.42     Biostep-RELP   Level 1   Minutes 15   METs 2      History  Smoking Status  . Former Smoker  . Packs/day: 1.00  . Years: 16.00  . Types: Cigarettes  . Start date: 11/03/1969  . Quit date: 07/22/1986  Smokeless Tobacco   . Never Used    Goals Met:  Proper associated with RPD/PD & O2 Sat Independence with exercise equipment Exercise tolerated well Personal goals reviewed No report of cardiac concerns or symptoms Strength training completed today  Goals Unmet:  Not Applicable  Comments: Pt able to follow exercise prescription today without complaint.  Will continue to monitor for progression.    Dr. Emily Filbert is Medical Director for [place] and [facility].

## 2017-05-28 DIAGNOSIS — Z7982 Long term (current) use of aspirin: Secondary | ICD-10-CM | POA: Diagnosis not present

## 2017-05-28 DIAGNOSIS — I2510 Atherosclerotic heart disease of native coronary artery without angina pectoris: Secondary | ICD-10-CM | POA: Diagnosis not present

## 2017-05-28 DIAGNOSIS — Z79899 Other long term (current) drug therapy: Secondary | ICD-10-CM | POA: Diagnosis not present

## 2017-05-28 DIAGNOSIS — Z794 Long term (current) use of insulin: Secondary | ICD-10-CM | POA: Diagnosis not present

## 2017-05-28 DIAGNOSIS — Z951 Presence of aortocoronary bypass graft: Secondary | ICD-10-CM

## 2017-05-28 DIAGNOSIS — I214 Non-ST elevation (NSTEMI) myocardial infarction: Secondary | ICD-10-CM | POA: Diagnosis not present

## 2017-05-28 DIAGNOSIS — Z7902 Long term (current) use of antithrombotics/antiplatelets: Secondary | ICD-10-CM | POA: Diagnosis not present

## 2017-05-28 LAB — GLUCOSE, CAPILLARY
GLUCOSE-CAPILLARY: 124 mg/dL — AB (ref 65–99)
GLUCOSE-CAPILLARY: 90 mg/dL (ref 65–99)
Glucose-Capillary: 177 mg/dL — ABNORMAL HIGH (ref 65–99)

## 2017-05-28 NOTE — Progress Notes (Signed)
Daily Session Note  Patient Details  Name: Lawrence [name] MRN: [id number] Date of Birth: 10-Sep-1951 Referring Provider:     Cardiac Rehab from 04/01/2017 in [facility] Cardiac and Pulmonary Rehab  Referring Provider  Josephina Gip MD      Encounter Date: 05/28/2017  Check In:     Session Check In - 05/28/17 1634      Check-In   Location [facility]-Cardiac & Pulmonary Rehab   Staff Present Gerlene Burdock, RN, BSN;Esmeralda Malay [facility], BS, ACSM CEP, Exercise Physiologist;Amanda Oletta Darter, [place], ACSM CEP, Exercise Physiologist   Supervising physician immediately available to respond to emergencies See telemetry face sheet for immediately available ER MD   Medication changes reported     No   Fall or balance concerns reported    No   Warm-up and Cool-down Performed on first and last piece of equipment   Resistance Training Performed Yes   VAD Patient? No     Pain Assessment   Currently in Pain? No/denies   Multiple Pain Sites No           Exercise Prescription Changes - 05/28/17 1000      Home Exercise Plan   Plans to continue exercise at [facility]   Frequency Add 2 additional days to program exercise sessions.   Initial Home Exercises Provided 05/27/17      History  Smoking Status  . Former Smoker  . Packs/day: 1.00  . Years: 16.00  . Types: Cigarettes  . Start date: 11/03/1969  . Quit date: 07/22/1986  Smokeless Tobacco  . Never Used    Goals Met:  Proper associated with RPD/PD & O2 Sat Independence with exercise equipment Exercise tolerated well Personal goals reviewed No report of cardiac concerns or symptoms Strength training completed today  Goals Unmet:  Not Applicable  Comments: Pt able to follow exercise prescription today without complaint.  Will continue to monitor for progression.   Dr. Emily Filbert is Medical Director for [place] and [facility].

## 2017-06-03 ENCOUNTER — Encounter: Payer: Medicare Other | Attending: Cardiovascular Disease | Admitting: *Deleted

## 2017-06-03 DIAGNOSIS — Z7902 Long term (current) use of antithrombotics/antiplatelets: Secondary | ICD-10-CM | POA: Diagnosis not present

## 2017-06-03 DIAGNOSIS — Z87891 Personal history of nicotine dependence: Secondary | ICD-10-CM | POA: Insufficient documentation

## 2017-06-03 DIAGNOSIS — Z79899 Other long term (current) drug therapy: Secondary | ICD-10-CM | POA: Diagnosis not present

## 2017-06-03 DIAGNOSIS — E119 Type 2 diabetes mellitus without complications: Secondary | ICD-10-CM | POA: Diagnosis not present

## 2017-06-03 DIAGNOSIS — Z7982 Long term (current) use of aspirin: Secondary | ICD-10-CM | POA: Insufficient documentation

## 2017-06-03 DIAGNOSIS — I2510 Atherosclerotic heart disease of native coronary artery without angina pectoris: Secondary | ICD-10-CM | POA: Insufficient documentation

## 2017-06-03 DIAGNOSIS — Z951 Presence of aortocoronary bypass graft: Secondary | ICD-10-CM

## 2017-06-03 DIAGNOSIS — I214 Non-ST elevation (NSTEMI) myocardial infarction: Secondary | ICD-10-CM | POA: Diagnosis not present

## 2017-06-03 DIAGNOSIS — Z794 Long term (current) use of insulin: Secondary | ICD-10-CM | POA: Insufficient documentation

## 2017-06-03 NOTE — Progress Notes (Signed)
Daily Session Note  Patient Details  Name: ZAUL HUBERS MRN: [id number] Date of Birth: 1951-10-25 Referring Provider:     Cardiac Rehab from 04/01/2017 in [facility] Cardiac and Pulmonary Rehab  Referring Provider  Josephina Gip MD      Encounter Date: 06/03/2017  Check In:     Session Check In - 06/03/17 1707      Check-In   Location [facility]-Cardiac & Pulmonary Rehab   Staff Present Gerlene Burdock, RN, Vickki Hearing, BA, ACSM CEP, Exercise Physiologist;Meredith Sherryll Burger, RN BSN   Supervising physician immediately available to respond to emergencies See telemetry face sheet for immediately available ER MD   Medication changes reported     No   Tobacco Cessation No Change   Warm-up and Cool-down Performed on first and last piece of equipment   Resistance Training Performed Yes   VAD Patient? No     Pain Assessment   Currently in Pain? No/denies         History  Smoking Status  . Former Smoker  . Packs/day: 1.00  . Years: 16.00  . Types: Cigarettes  . Start date: 11/03/1969  . Quit date: 07/22/1986  Smokeless Tobacco  . Never Used    Goals Met:  Proper associated with RPD/PD & O2 Sat Exercise tolerated well No report of cardiac concerns or symptoms Strength training completed today  Goals Unmet:  Not Applicable  Comments:     Dr. Emily Filbert is Medical Director for [place] and [facility].

## 2017-06-08 ENCOUNTER — Encounter: Payer: Medicare Other | Admitting: *Deleted

## 2017-06-08 DIAGNOSIS — Z794 Long term (current) use of insulin: Secondary | ICD-10-CM | POA: Diagnosis not present

## 2017-06-08 DIAGNOSIS — Z7982 Long term (current) use of aspirin: Secondary | ICD-10-CM | POA: Diagnosis not present

## 2017-06-08 DIAGNOSIS — Z79899 Other long term (current) drug therapy: Secondary | ICD-10-CM | POA: Diagnosis not present

## 2017-06-08 DIAGNOSIS — Z951 Presence of aortocoronary bypass graft: Secondary | ICD-10-CM

## 2017-06-08 DIAGNOSIS — I2510 Atherosclerotic heart disease of native coronary artery without angina pectoris: Secondary | ICD-10-CM | POA: Diagnosis not present

## 2017-06-08 DIAGNOSIS — Z7902 Long term (current) use of antithrombotics/antiplatelets: Secondary | ICD-10-CM | POA: Diagnosis not present

## 2017-06-08 DIAGNOSIS — I214 Non-ST elevation (NSTEMI) myocardial infarction: Secondary | ICD-10-CM | POA: Diagnosis not present

## 2017-06-08 NOTE — Progress Notes (Signed)
Daily Session Note  Patient Details  Name: Lawrence [name] MRN: [id number] Date of Birth: [birth year] Referring Provider:     Cardiac Rehab from 04/01/2017 in [facility] Cardiac and Pulmonary Rehab  Referring Provider  Josephina Gip MD      Encounter Date: 06/08/2017  Check In:     Session Check In - 06/08/17 1734      Check-In   Location [facility]-Cardiac & Pulmonary Rehab   Staff Present Nyoka Cowden, RN, BSN, Bonnita [place], BS, ACSM CEP, Exercise Physiologist;Other  Carson Myrtle, RRT   Supervising physician immediately available to respond to emergencies See telemetry face sheet for immediately available ER MD   Medication changes reported     No   Fall or balance concerns reported    No   Tobacco Cessation No Change   Warm-up and Cool-down Performed on first and last piece of equipment   Resistance Training Performed Yes   VAD Patient? No     Pain Assessment   Currently in Pain? No/denies         History  Smoking Status  . Former Smoker  . Packs/day: 1.00  . Years: 16.00  . Types: Cigarettes  . Start date: 11/03/1969  . Quit date: 07/22/1986  Smokeless Tobacco  . Never Used    Goals Met:  Proper associated with RPD/PD & O2 Sat Exercise tolerated well No report of cardiac concerns or symptoms Strength training completed today  Goals Unmet:  Not Applicable  Comments:     Dr. Emily Filbert is Medical Director for [place] and [facility].

## 2017-06-09 DIAGNOSIS — R42 Dizziness and giddiness: Secondary | ICD-10-CM | POA: Diagnosis not present

## 2017-06-10 ENCOUNTER — Encounter: Payer: Medicare Other | Admitting: *Deleted

## 2017-06-10 DIAGNOSIS — Z79899 Other long term (current) drug therapy: Secondary | ICD-10-CM | POA: Diagnosis not present

## 2017-06-10 DIAGNOSIS — I214 Non-ST elevation (NSTEMI) myocardial infarction: Secondary | ICD-10-CM | POA: Diagnosis not present

## 2017-06-10 DIAGNOSIS — Z794 Long term (current) use of insulin: Secondary | ICD-10-CM | POA: Diagnosis not present

## 2017-06-10 DIAGNOSIS — Z7902 Long term (current) use of antithrombotics/antiplatelets: Secondary | ICD-10-CM | POA: Diagnosis not present

## 2017-06-10 DIAGNOSIS — I2510 Atherosclerotic heart disease of native coronary artery without angina pectoris: Secondary | ICD-10-CM | POA: Diagnosis not present

## 2017-06-10 DIAGNOSIS — Z7982 Long term (current) use of aspirin: Secondary | ICD-10-CM | POA: Diagnosis not present

## 2017-06-10 NOTE — Progress Notes (Signed)
Daily Session Note  Patient Details  Name: LEONTE HORRIGAN MRN: [id number] Date of Birth: Jun 04, 1951 Referring Provider:     Cardiac Rehab from 04/01/2017 in [facility] Cardiac and Pulmonary Rehab  Referring Provider  Josephina Gip MD      Encounter Date: 06/10/2017  Check In:     Session Check In - 06/10/17 1733      Check-In   Location [facility]-Cardiac & Pulmonary Rehab   Staff Present Nyoka Cowden, RN, BSN, MA;Susanne Bice, RN, BSN, CCRP;Jonni Oelkers Sherryll Burger, RN BSN   Supervising physician immediately available to respond to emergencies See telemetry face sheet for immediately available ER MD   Medication changes reported     No   Fall or balance concerns reported    No   Warm-up and Cool-down Performed on first and last piece of equipment   Resistance Training Performed Yes   VAD Patient? No     Pain Assessment   Currently in Pain? No/denies         History  Smoking Status  . Former Smoker  . Packs/day: 1.00  . Years: 16.00  . Types: Cigarettes  . Start date: 11/03/1969  . Quit date: 07/22/1986  Smokeless Tobacco  . Never Used    Goals Met:  Proper associated with RPD/PD & O2 Sat Independence with exercise equipment Exercise tolerated well No report of cardiac concerns or symptoms Strength training completed today  Goals Unmet:  Not Applicable  Comments: Pt able to follow exercise prescription today without complaint.  Will continue to monitor for progression.    Dr. Emily Filbert is Medical Director for [place] and [facility].

## 2017-06-15 DIAGNOSIS — Z7902 Long term (current) use of antithrombotics/antiplatelets: Secondary | ICD-10-CM | POA: Diagnosis not present

## 2017-06-15 DIAGNOSIS — I214 Non-ST elevation (NSTEMI) myocardial infarction: Secondary | ICD-10-CM | POA: Diagnosis not present

## 2017-06-15 DIAGNOSIS — Z794 Long term (current) use of insulin: Secondary | ICD-10-CM | POA: Diagnosis not present

## 2017-06-15 DIAGNOSIS — Z79899 Other long term (current) drug therapy: Secondary | ICD-10-CM | POA: Diagnosis not present

## 2017-06-15 DIAGNOSIS — Z951 Presence of aortocoronary bypass graft: Secondary | ICD-10-CM

## 2017-06-15 DIAGNOSIS — Z7982 Long term (current) use of aspirin: Secondary | ICD-10-CM | POA: Diagnosis not present

## 2017-06-15 DIAGNOSIS — I2510 Atherosclerotic heart disease of native coronary artery without angina pectoris: Secondary | ICD-10-CM | POA: Diagnosis not present

## 2017-06-15 LAB — GLUCOSE, CAPILLARY: Glucose-Capillary: 135 mg/dL — ABNORMAL HIGH (ref 65–99)

## 2017-06-15 IMAGING — DX DG CHEST 1V PORT
1 series · 1 of 1 positions shown · non-contrast
Comparison: June 27, 2016

CLINICAL DATA: Status post CABG

EXAM:
PORTABLE CHEST 1 VIEW

[chest ap]
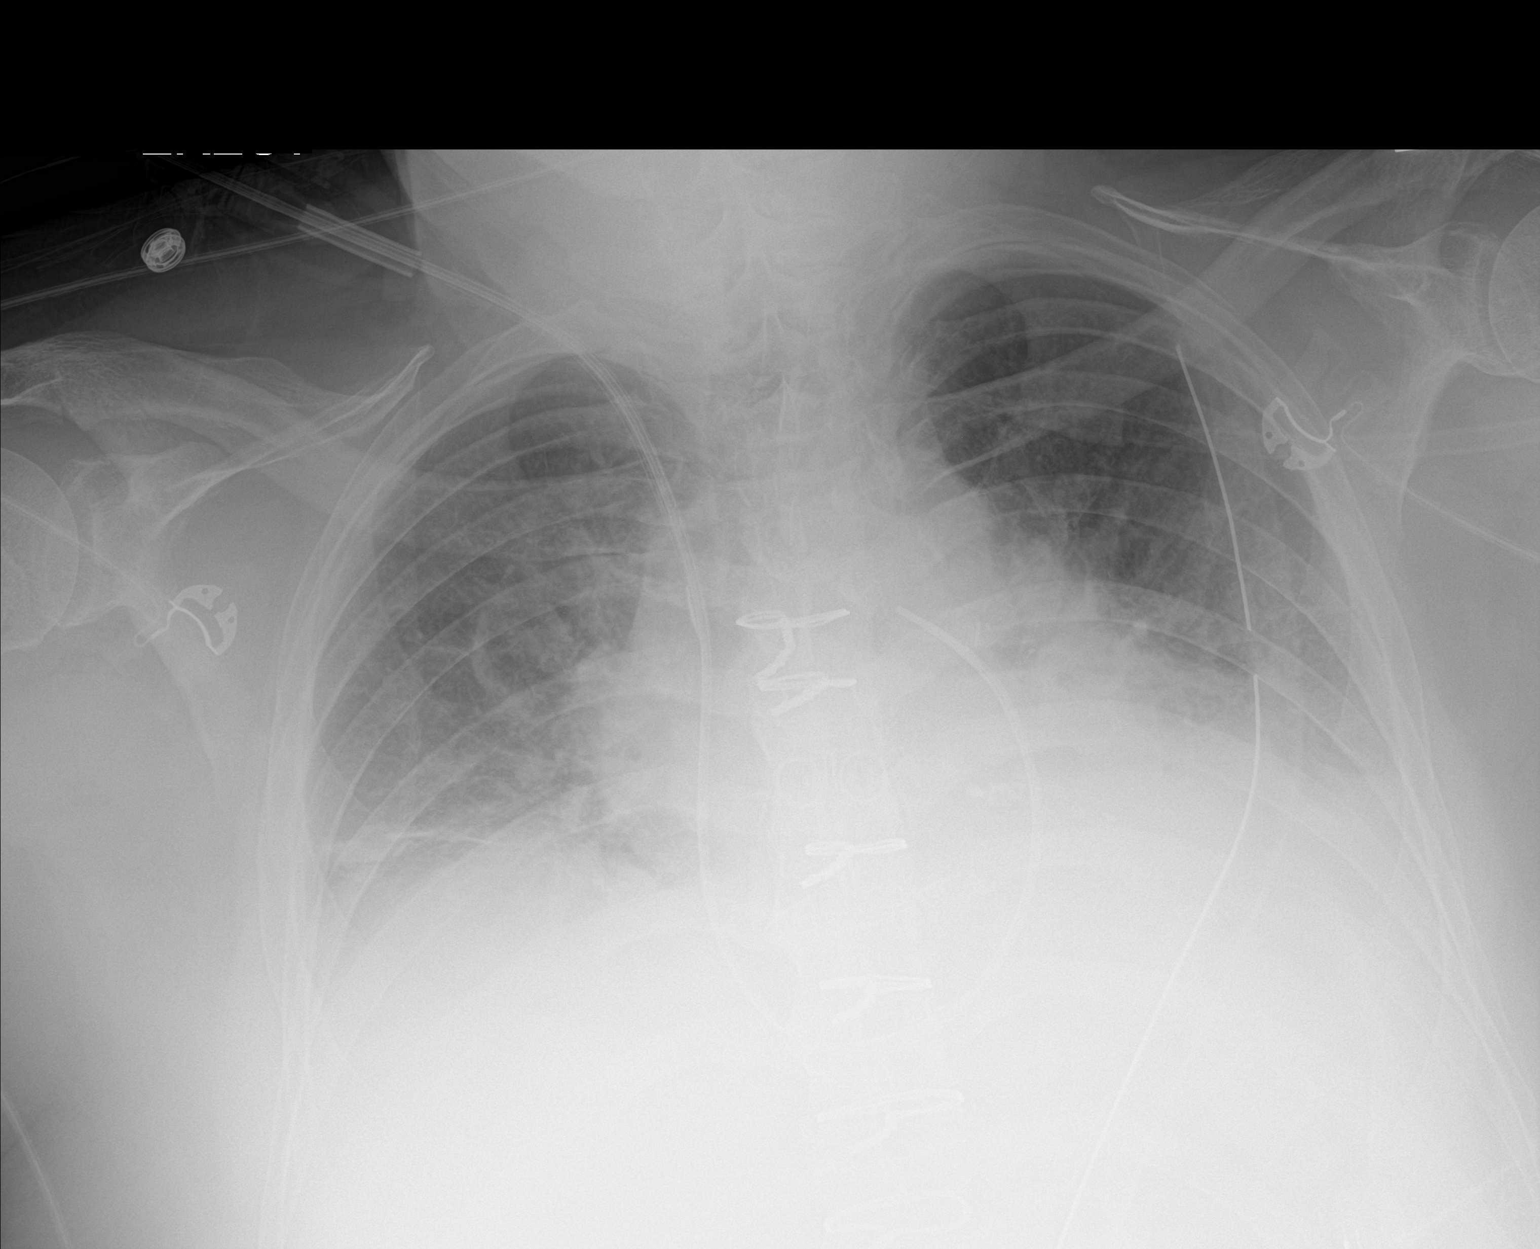

[1 of 1 positions shown; findings below may reference images not displayed]

FINDINGS: The heart size and mediastinal contours are stable. The heart size
is enlarged. Swan-Ganz catheter is identified with distal tip in the
main pulmonary artery. Previously noted endotracheal tube,
nasogastric tube are removed. Left chest tube is unchanged. There is
no pneumothorax. There is pulmonary edema with bilateral pleural
effusions. Consolidation of bilateral lung bases are identified
probably due to atelectasis. The visualized skeletal structures are
stable.
IMPRESSION: Interval increased pulmonary edema with interval developed patchy
consolidation of both lung bases probably due to atelectasis.
Bilateral pleural effusions.

## 2017-06-15 NOTE — Progress Notes (Signed)
Daily Session Note  Patient Details  Name: Lawrence [name] MRN: [id number] Date of Birth: 1951/06/05 Referring Provider:     Cardiac Rehab from 04/01/2017 in [facility] Cardiac and Pulmonary Rehab  Referring Provider  Josephina Gip MD      Encounter Date: 06/15/2017  Check In:     Session Check In - 06/15/17 1723      Check-In   Location [facility]-Cardiac & Pulmonary Rehab   Staff Present Nada Maclachlan, BA, ACSM CEP, Exercise Physiologist;Kelly Amedeo Plenty, BS, ACSM CEP, Exercise Physiologist;Meredith Sherryll Burger, RN BSN   Supervising physician immediately available to respond to emergencies See telemetry face sheet for immediately available ER MD   Medication changes reported     No   Fall or balance concerns reported    No   Warm-up and Cool-down Performed on first and last piece of equipment   Resistance Training Performed Yes   VAD Patient? No     Pain Assessment   Currently in Pain? No/denies         History  Smoking Status  . Former Smoker  . Packs/day: 1.00  . Years: 16.00  . Types: Cigarettes  . Start date: 11/03/1969  . Quit date: 07/22/1986  Smokeless Tobacco  . Never Used    Goals Met:  Independence with exercise equipment Exercise tolerated well No report of cardiac concerns or symptoms Strength training completed today  Goals Unmet:  Not Applicable  Comments: Pt able to follow exercise prescription today without complaint.  Will continue to monitor for progression.    Dr. Emily Filbert is Medical Director for [place] and [facility].

## 2017-06-16 DIAGNOSIS — R42 Dizziness and giddiness: Secondary | ICD-10-CM | POA: Diagnosis not present

## 2017-06-16 IMAGING — DX DG CHEST 1V PORT
1 series · 1 of 1 positions shown · non-contrast
Comparison: 06/28/2016

CLINICAL DATA: Status post CABG

EXAM:
PORTABLE CHEST 1 VIEW

[chest ap]
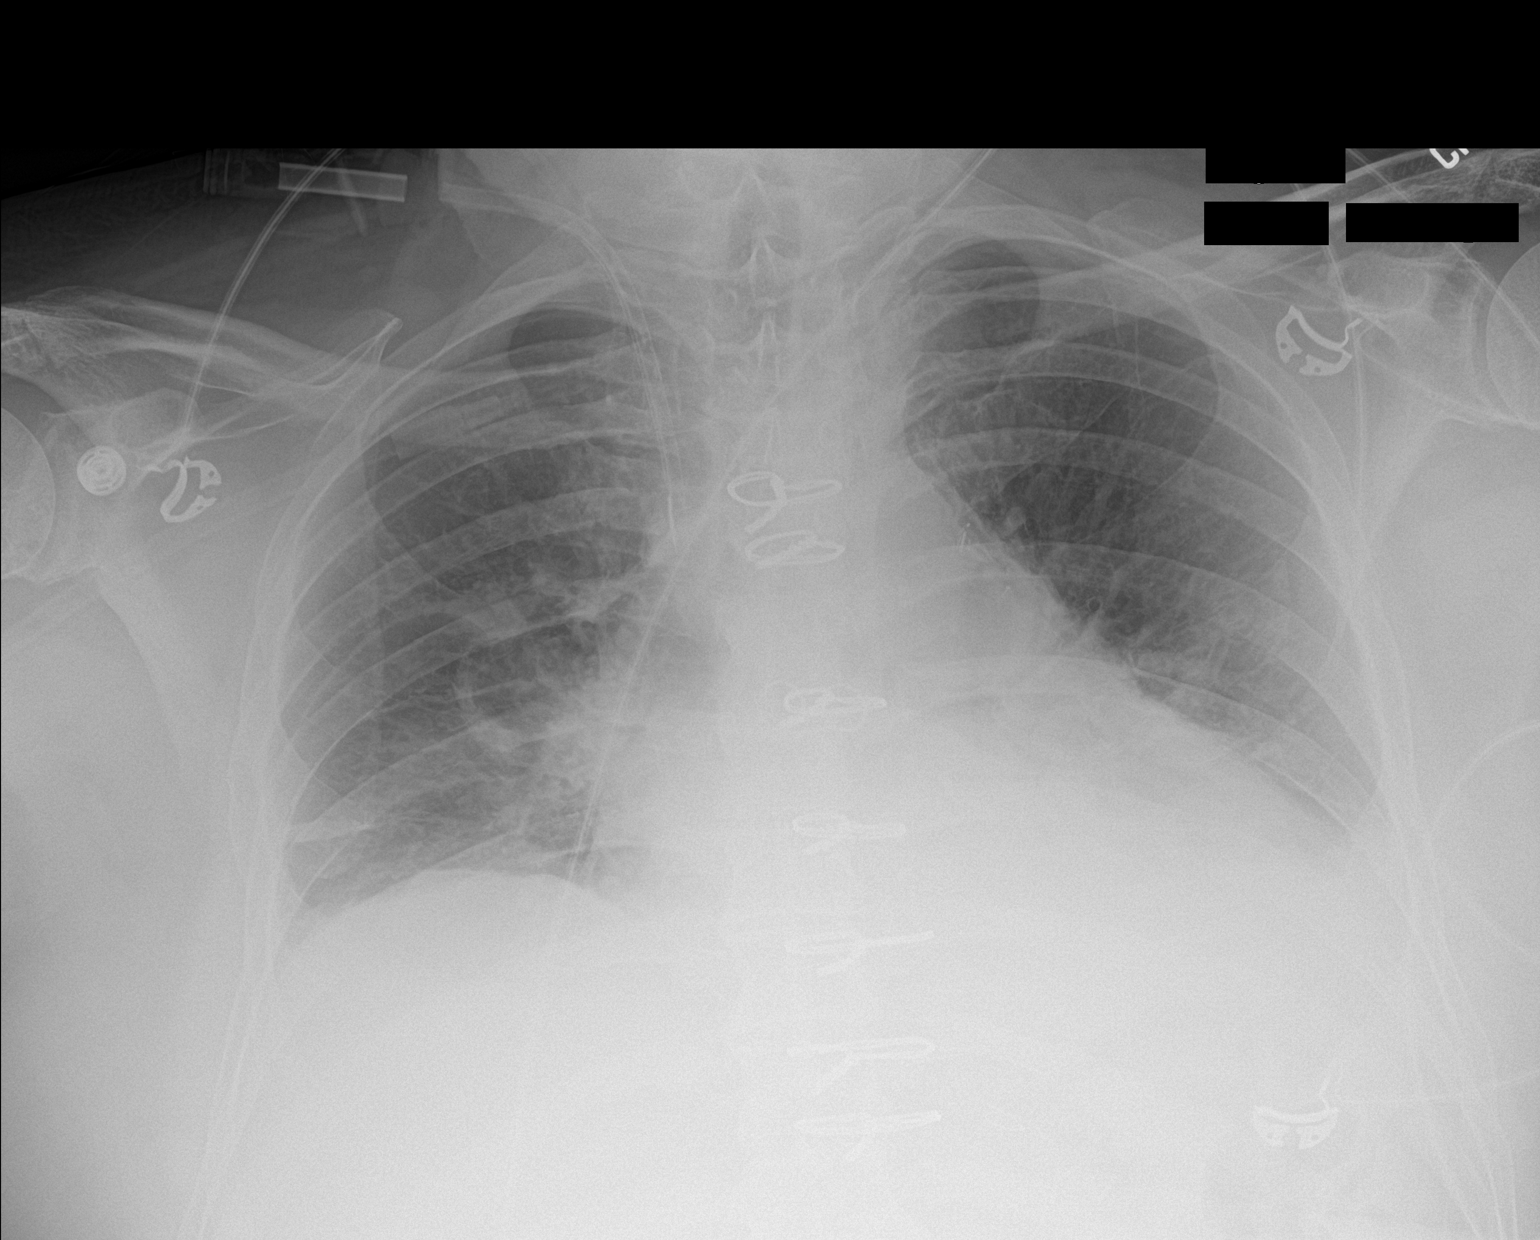

[1 of 1 positions shown; findings below may reference images not displayed]

FINDINGS: Interval retraction of Swan-Ganz catheter. Sternotomy wires overlie
stable cardiac silhouette. There is improvement in vascular
congestion. Low lung volumes with some improvement in atelectasis.
No pneumothorax.
IMPRESSION: Improved vascular congestion and atelectasis.  Low lung volumes.

## 2017-06-17 ENCOUNTER — Encounter: Payer: Self-pay | Admitting: *Deleted

## 2017-06-17 DIAGNOSIS — Z7982 Long term (current) use of aspirin: Secondary | ICD-10-CM | POA: Diagnosis not present

## 2017-06-17 DIAGNOSIS — Z951 Presence of aortocoronary bypass graft: Secondary | ICD-10-CM

## 2017-06-17 DIAGNOSIS — I214 Non-ST elevation (NSTEMI) myocardial infarction: Secondary | ICD-10-CM

## 2017-06-17 DIAGNOSIS — Z79899 Other long term (current) drug therapy: Secondary | ICD-10-CM | POA: Diagnosis not present

## 2017-06-17 DIAGNOSIS — Z7902 Long term (current) use of antithrombotics/antiplatelets: Secondary | ICD-10-CM | POA: Diagnosis not present

## 2017-06-17 DIAGNOSIS — I2510 Atherosclerotic heart disease of native coronary artery without angina pectoris: Secondary | ICD-10-CM | POA: Diagnosis not present

## 2017-06-17 DIAGNOSIS — Z794 Long term (current) use of insulin: Secondary | ICD-10-CM | POA: Diagnosis not present

## 2017-06-17 LAB — GLUCOSE, CAPILLARY: Glucose-Capillary: 86 mg/dL (ref 65–99)

## 2017-06-17 NOTE — Progress Notes (Signed)
Cardiac Individual Treatment Plan  Patient Details  Name: Lawrence [name] MRN: [id number] Date of Birth: 1951-06-19 Referring Provider:     Cardiac Rehab from 04/01/2017 in [facility] Cardiac and Pulmonary Rehab  Referring Provider  Josephina Gip MD      Initial Encounter Date:    Cardiac Rehab from 04/01/2017 in [facility] Cardiac and Pulmonary Rehab  Date  04/01/17  Referring Provider  Josephina Gip MD      Visit Diagnosis: NSTEMI (non-ST elevated myocardial infarction) ([place])  S/P CABG x 4  Patient's Home Medications on Admission:  Current Outpatient Prescriptions:  .  aspirin 81 MG EC tablet, Take 81 mg by mouth daily. Swallow whole., Disp: , Rfl:  .  cholecalciferol (VITAMIN D) 1000 units tablet, Take 1,000 Units by mouth daily., Disp: , Rfl:  .  clopidogrel (PLAVIX) 75 MG tablet, Take 1 tablet (75 mg total) by mouth daily., Disp: 30 tablet, Rfl: 0 .  docusate sodium (COLACE) 100 MG capsule, Take 200 mg by mouth 2 (two) times daily., Disp: , Rfl:  .  fexofenadine (ALLEGRA) 180 MG tablet, Take 180 mg by mouth daily., Disp: , Rfl:  .  furosemide (LASIX) 40 MG tablet, Take 1 tablet (40 mg total) by mouth daily., Disp: 30 tablet, Rfl: 1 .  glucagon (GLUCAGON EMERGENCY) 1 MG injection, Inject 1 mg into the vein once as needed. Reported on 02/11/2016, Disp: , Rfl:  .  insulin aspart (NOVOLOG) 100 UNIT/ML injection, Inject 20 Units into the skin 3 (three) times daily. (Patient taking differently: Inject 20 Units into the skin 3 (three) times daily with meals. ), Disp: 10 mL, Rfl: 5 .  insulin glargine (LANTUS) 100 UNIT/ML injection, Inject 0.24 mLs (24 Units total) into the skin at bedtime., Disp: 30 mL, Rfl: 1 .  isosorbide mononitrate (IMDUR) 30 MG 24 hr tablet, Take 30 mg by mouth daily., Disp: , Rfl:  .  latanoprost (XALATAN) 0.005 % ophthalmic solution, Place 1 drop into both eyes at bedtime., Disp: , Rfl:  .  metoprolol tartrate (LOPRESSOR) 25 MG tablet, Take 25 mg by mouth 2 (two) times  daily., Disp: , Rfl:  .  Multiple Vitamin (MULTIVITAMIN) capsule, Take 1 capsule by mouth daily., Disp: , Rfl:  .  nitroGLYCERIN (NITROSTAT) 0.4 MG SL tablet, Place 1 tablet (0.4 mg total) under the tongue every 5 (five) minutes as needed for chest pain., Disp: 50 tablet, Rfl: 1 .  potassium chloride SA (K-DUR,KLOR-CON) 20 MEQ tablet, Take 1 tablet (20 mEq total) by mouth daily., Disp: 30 tablet, Rfl: 1 .  ranolazine (RANEXA) 500 MG 12 hr tablet, Take 1 tablet (500 mg total) by mouth 2 (two) times daily., Disp: 60 tablet, Rfl: 0 .  rosuvastatin (CRESTOR) 20 MG tablet, Take 1 tablet (20 mg total) by mouth daily at 6 PM., Disp: 30 tablet, Rfl: 1 .  vitamin C (ASCORBIC ACID) 500 MG tablet, Take 500 mg by mouth 2 (two) times daily. , Disp: , Rfl:   Past Medical History: Past Medical History:  Diagnosis Date  . CAD (coronary artery disease)    07/2007  . Diabetes ([place])   . Elevated lipids   . Glaucoma   . HBP (high blood pressure)   . Neuropathy   . Osteoarthritis   . Skin cancer   . Sleep apnea    CPAP    Tobacco Use: History  Smoking Status  . Former Smoker  . Packs/day: 1.00  . Years: 16.00  . Types: Cigarettes  .  Start date: 11/03/1969  . Quit date: 07/22/1986  Smokeless Tobacco  . Never Used    Labs: Recent Review Flowsheet Data    Labs for ITP Cardiac and Pulmonary Rehab Latest Ref Rng & Units 06/27/2016 06/28/2016 09/08/2016 03/18/2017 04/23/2017   Cholestrol 0 - 200 mg/dL - - 164 143 137   LDLCALC 0 - 99 mg/dL - - 69 63 61   HDL >40 mg/dL - - 81.00 67 67   Trlycerides <150 mg/dL - - 69.0 63 45   Hemoglobin A1c 4.8 - 5.6 % - - 7.9(H) 7.9(H) 7.8(H)   PHART 7.350 - 7.450 7.390 - - - -   PCO2ART 35.0 - 45.0 mmHg 45.2(H) - - - -   HCO3 20.0 - 24.0 mEq/L 27.3(H) - - - -   TCO2 0 - 100 mmol/L 29 26 - - -   O2SAT % 96.0 - - - -       Exercise Target Goals:    Exercise Program Goal: Individual exercise prescription set with THRR, safety & activity barriers. Participant  demonstrates ability to understand and report RPE using BORG scale, to self-measure pulse accurately, and to acknowledge the importance of the exercise prescription.  Exercise Prescription Goal: Starting with aerobic activity 30 plus minutes a day, 3 days per week for initial exercise prescription. Provide home exercise prescription and guidelines that participant acknowledges understanding prior to discharge.  Activity Barriers & Risk Stratification:     Activity Barriers & Cardiac Risk Stratification - 04/01/17 1644      Activity Barriers & Cardiac Risk Stratification   Activity Barriers Arthritis;Joint Problems;Muscular Weakness;Balance Concerns   Cardiac Risk Stratification High      6 Minute Walk:     6 Minute Walk    Row Name 04/01/17 1509         6 Minute Walk   Phase Initial     Distance 1200 feet     Walk Time 6 minutes     # of Rest Breaks 0     MPH 2.27     METS 2.22     RPE 12     VO2 Peak 7.76     Symptoms No     Resting HR 82 bpm     Resting BP 124/70     Max Ex. HR 110 bpm     Max Ex. BP 152/64        Oxygen Initial Assessment:   Oxygen Re-Evaluation:   Oxygen Discharge (Final Oxygen Re-Evaluation):   Initial Exercise Prescription:     Initial Exercise Prescription - 04/01/17 1500      Date of Initial Exercise RX and Referring Provider   Date 04/01/17   Referring Provider Josephina Gip MD     Treadmill   MPH 1.7   Grade 0.5   Minutes 15   METs 2.42     Recumbant Elliptical   Level 1   RPM 50   Minutes 15   METs 2     Biostep-RELP   Level 1   SPM 50   Minutes 15   METs 2     Prescription Details   Frequency (times per week) 3   Duration Progress to 45 minutes of aerobic exercise without signs/symptoms of physical distress     Intensity   THRR 40-80% of Max Heartrate 110-138   Ratings of Perceived Exertion 11-13   Perceived Dyspnea 0-4     Progression   Progression Continue to progress workloads to maintain  intensity without signs/symptoms of physical distress.     Resistance Training   Training Prescription Yes   Weight 3 lbs   Reps 10-15      Perform Capillary Blood Glucose checks as needed.  Exercise Prescription Changes:     Exercise Prescription Changes    Row Name 04/01/17 1500 04/15/17 1500 05/15/17 0900 05/27/17 1100 05/28/17 1000     Response to Exercise   Blood Pressure (Admit) 124/70 140/64 110/58 104/58  -   Blood Pressure (Exercise) 152/64 156/74 144/60 150/76  -   Blood Pressure (Exit)  - 100/70 120/60 114/62  -   Heart Rate (Admit) 82 bpm 107 bpm 48 bpm 67 bpm  -   Heart Rate (Exercise) 110 bpm 108 bpm 96 bpm 95 bpm  -   Heart Rate (Exit)  - 76 bpm 81 bpm 79 bpm  -   Oxygen Saturation (Admit) 97 %  -  -  -  -   Oxygen Saturation (Exercise) 97 %  -  -  -  -   Rating of Perceived Exertion (Exercise) '12 16 15 15  ' -   Symptoms none  - none none  -   Comments walk test results  -  -  -  -   Duration  - Progress to 45 minutes of aerobic exercise without signs/symptoms of physical distress Progress to 45 minutes of aerobic exercise without signs/symptoms of physical distress Progress to 45 minutes of aerobic exercise without signs/symptoms of physical distress  -   Intensity  - THRR unchanged THRR unchanged THRR unchanged  -     Progression   Progression  -  - Continue to progress workloads to maintain intensity without signs/symptoms of physical distress. Continue to progress workloads to maintain intensity without signs/symptoms of physical distress.  -   Average METs  -  - 1.2 2.2  -     Resistance Training   Training Prescription  - Yes No Yes  -   Weight  - '3 3 3  ' -   Reps  - 10-15 10-15 10-15  -     Interval Training   Interval Training  -  -  - No  -     Treadmill   MPH  - 1.7  - 1.7  -   Grade  - 0.5  - 0.5  -   Minutes  - 15  - 15  -   METs  - 2.42  - 2.42  -     Recumbant Elliptical   Level  -  - 1  -  -   Minutes  -  - 15  -  -   METs  -  - 1.4  -   -     Biostep-RELP   Level  - '1 1 1  ' -   SPM  - 50  -  -  -   Minutes  - '15 15 15  ' -   METs  -  - 1 2  -     Home Exercise Plan   Plans to continue exercise at  -  -  -  - [employer]   Frequency  -  -  -  - Add 2 additional days to program exercise sessions.   Initial Home Exercises Provided  -  -  -  - 05/27/17   Row Name 06/09/17 1100             Response to Exercise  Blood Pressure (Exercise) 170/80       Blood Pressure (Exit) 128/64       Heart Rate (Admit) 54 bpm       Heart Rate (Exercise) 102 bpm       Heart Rate (Exit) 73 bpm       Rating of Perceived Exertion (Exercise) 14       Symptoms none       Duration Progress to 45 minutes of aerobic exercise without signs/symptoms of physical distress       Intensity THRR unchanged         Progression   Progression Continue to progress workloads to maintain intensity without signs/symptoms of physical distress.       Average METs 2.21         Resistance Training   Training Prescription Yes       Weight 3       Reps 10-15         Interval Training   Interval Training No         Treadmill   MPH 1.7       Grade 0.5       Minutes 15       METs 2.42         Biostep-RELP   Level 1       Minutes 15       METs 2         Home Exercise Plan   Plans to continue exercise at [employer]       Frequency Add 2 additional days to program exercise sessions.       Initial Home Exercises Provided 05/27/17          Exercise Comments:     Exercise Comments    Row Name 04/06/17 1754 04/09/17 1719 04/15/17 1531       Exercise Comments First full day of exercise!  [name] was oriented to gym and equipment including functions, settings, policies, and procedures.  [name]'s individual exercise prescription and treatment plan were reviewed.  All starting workloads were established based on the results of the 6 minute walk test done at initial orientation visit.  The plan for exercise progression was also introduced and  progression will be customized based on patient's performance and goals. [place] had chest pain during exercise.  It resolved with rest and he completed the session with no other issues. [name] tolerated exercise well last session.  Staff will continue to monitor progress.        Exercise Goals and Review:     Exercise Goals    Row Name 04/01/17 1513             Exercise Goals   Increase Physical Activity Yes       Intervention Provide advice, education, support and counseling about physical activity/exercise needs.;Develop an individualized exercise prescription for aerobic and resistive training based on initial evaluation findings, risk stratification, comorbidities and participant's personal goals.       Expected Outcomes Achievement of increased cardiorespiratory fitness and enhanced flexibility, muscular endurance and strength shown through measurements of functional capacity and personal statement of participant.       Increase Strength and Stamina Yes       Intervention Provide advice, education, support and counseling about physical activity/exercise needs.;Develop an individualized exercise prescription for aerobic and resistive training based on initial evaluation findings, risk stratification, comorbidities and participant's personal goals.       Expected Outcomes Achievement of increased cardiorespiratory  fitness and enhanced flexibility, muscular endurance and strength shown through measurements of functional capacity and personal statement of participant.          Exercise Goals Re-Evaluation :     Exercise Goals Re-Evaluation    Row Name 04/29/17 1354 05/15/17 0935 05/27/17 1134 05/28/17 1034 05/29/17 0922     Exercise Goal Re-Evaluation   Exercise Goals Review  - Increase Physical Activity;Increase Strenth and Stamina Increase Physical Activity;Increase Strenth and Stamina Increase Physical Activity Increase Physical Activity   Comments Out since last review. [name] is  back after missing sessions.  He has tolerated exercise well and is encouraged by his doctor to exercise. [name] is reaching RPE goals for exercise.  Regular attendance will produce better outcomes for strength and stamina. Home exercise was reviewed with [facility] 7/25.  HR RPE and safety were reviewed.  Pt voiced understanding. [name] plans to exercise three days per week and walk at the [place] if he cant get to class on time.   Expected Outcomes  - [place] will be able to attend class on a regular basis.  [place] will build strength and stamina. [place] will  continue to attend 3 days per week.  [place] will complete the program and graduate from HT. [place] will be more active outside of classes.  [place] will continue exercise in the [employer]. [place] will exercise 3 days per week in HT.  [place] will also exercise on his own outside of class.   [place] Name 06/09/17 1127             Exercise Goal Re-Evaluation   Exercise Goals Review Increase Physical Activity;Increase Strenth and Stamina       Comments [name] has been doing well in rehab.  He has been walking some at home.  His blood pressures continue to run a little high during exercise.  We will continue to monitor his progression.       Expected Outcomes Short: Increase workload on [employer]. Long: Exercise more at home and come to class regularly.          Discharge Exercise Prescription (Final Exercise Prescription Changes):     Exercise Prescription Changes - 06/09/17 1100      Response to Exercise   Blood Pressure (Exercise) 170/80   Blood Pressure (Exit) 128/64   Heart Rate (Admit) 54 bpm   Heart Rate (Exercise) 102 bpm   Heart Rate (Exit) 73 bpm   Rating of Perceived Exertion (Exercise) 14   Symptoms none   Duration Progress to 45 minutes of aerobic exercise without signs/symptoms of physical distress   Intensity THRR unchanged     Progression   Progression Continue to progress  workloads to maintain intensity without signs/symptoms of physical distress.   Average METs 2.21     Resistance Training   Training Prescription Yes   Weight 3   Reps 10-15     Interval Training   Interval Training No     Treadmill   MPH 1.7   Grade 0.5   Minutes 15   METs 2.42     Biostep-RELP   Level 1   Minutes 15   METs 2     Home Exercise Plan   Plans to continue exercise at [employer]   Frequency Add 2 additional days to program exercise sessions.   Initial Home Exercises Provided 05/27/17      Nutrition:  Target Goals: Understanding of nutrition guidelines, daily intake of sodium <153m, cholesterol <2030m calories 30% from fat and 7% or less from saturated fats, daily to have 5 or more servings of fruits and vegetables.  Biometrics:     Pre Biometrics - 04/01/17 1513      Pre Biometrics   Height 5' 10.8" (1.798 m)   Weight (!)  300 lb 1.6 oz (136.1 kg)   Waist Circumference 50.25 inches   Hip Circumference 54.5 inches   Waist to Hip Ratio 0.92 %   BMI (Calculated) 42.2   Single Leg Stand 1.43 seconds       Nutrition Therapy Plan and Nutrition Goals:     Nutrition Therapy & Goals - 04/01/17 1644      Nutrition Therapy   RD appointment defered Yes   Drug/Food Interactions Statins/Certain Fruits      Nutrition Discharge: Rate Your Plate Scores:     Nutrition Assessments - 04/01/17 1639      MEDFICTS Scores   Pre Score 67      Nutrition Goals Re-Evaluation:   Nutrition Goals Discharge (Final Nutrition Goals Re-Evaluation):   Psychosocial: Target Goals: Acknowledge presence or absence of significant depression and/or stress, maximize coping skills, provide positive support system. Participant is able to verbalize types and ability to use techniques and skills needed for reducing stress and depression.   Initial Review & Psychosocial Screening:     Initial Psych Review & Screening - 04/01/17 1646      Initial Review   Current  issues with Current Sleep Concerns     Family Dynamics   Good Support System? Yes     Barriers   Psychosocial barriers to participate in program The patient should benefit from training in stress management and relaxation.     Screening Interventions   Interventions Encouraged to exercise      Quality of Life Scores:      Quality of Life - 04/01/17 1629      Quality of Life Scores   Health/Function Pre 11.2 %   Socioeconomic Pre 22.75 %   Psych/Spiritual Pre 19.5 %   Family Pre 25.5 %   GLOBAL Pre 16.97 %      PHQ-9: Recent Review Flowsheet Data    Depression [facility] [place]/9 04/01/2017 12/12/2016 09/11/2016 09/05/2016   Decreased Interest 1 1 0 0   Down, Depressed, Hopeless '1 1 1 ' 0   PHQ - 2 Score '2 2 1 ' 0   Altered sleeping '1 3 1 ' -   Tired, decreased energy '1 1 1 ' -   Change in appetite 2 1 0 -   Feeling bad or failure about yourself  '2 1 1 ' -   Trouble concentrating '1 1 1 ' -   Moving slowly or fidgety/restless '1 1 1 ' -   Suicidal thoughts 0 0 0 -   PHQ-9 Score '10 10 6 ' -   Difficult doing work/chores Somewhat difficult Somewhat difficult Somewhat difficult -     Interpretation of Total Score  Total Score Depression Severity:  1-4 = Minimal depression, 5-9 = Mild depression, 10-14 = Moderate depression, 15-19 = Moderately severe depression, 20-27 = Severe depression   Psychosocial Evaluation and Intervention:     Psychosocial Evaluation - 04/06/17 1748      Psychosocial Evaluation & Interventions   Interventions Stress management education;Relaxation education;Encouraged to exercise with the program and follow exercise prescription   Comments Counselor met with [facility] for return to this Cardiac Rehab  program subsequent to another heart attack several weeks ago.  He continues to have a strong support system with his spouse; and some wonderful neighbors who are like family to him.  He also is actively involved in his local church.  [name] has multiple health  issues with Diabetes; shoulder and hip pain; and had quadruple bypass in August 2017 with multiple current blockages reported where that procedure was done.  [name] also had his left toe amputated several months ago.  He states he sleeps well with his CPAP and has a good appetite.  He has no "official" diagnosis of depression or anxiety, but he admits he is struggling lately with a PHQ-9 of "10" and he was tearful at times talking to this counselor.  [name] reports his greatest stressor is his health.  He has goals to get stronger overall and have less shortness of breath.   Counselor  commended [place] for coming back into this program and his commitment to consistent exercise to reach his stated goals.  He will be followed by staff throughout the course of this program.    Expected [place] will benefit from consistent exercise to achieve his stated goals.  He will also benefit from the psychoeducational components of this program to help with his mood and managing stress in his life.  [name] may need to see a counselor if these symptoms do now improve in the near future.     Continue Psychosocial Services  Follow up required by staff      Psychosocial Re-Evaluation:     Psychosocial Re-Evaluation    [place] Name 05/20/17 1723             Psychosocial Re-Evaluation   Comments Counselor follow up with [name] today reporting his Dr. put him back on a former medication recently which has helped reduce the tightness in his chest and also decrease his pain levels overall.  [name] states he feels better overall and enjoys coming to each session.  He has also benefitted from exercise and reports a decreased need for insulin recently - which is a positive as well.  [name] continues to struggle with coping with the tasks he can no longer do as a result of his health issues.  He also continues to be concerned about his spouse's health issues; but admits he is coping better overall.  And he looks forward to  completion of this program so he and his spouse can return to fit for Life classes together.  Counselor and staff will continue to follow with [place].            Psychosocial Discharge (Final Psychosocial Re-Evaluation):     Psychosocial Re-Evaluation - 05/20/17 1723      Psychosocial Re-Evaluation   Comments Counselor follow up with [name] today reporting his Dr. put him back on a former medication recently which has helped reduce the tightness in his chest and also decrease his pain levels overall.  [name] states he feels better overall and enjoys coming to each session.  He has also benefitted from exercise and reports a decreased need for insulin recently - which is a positive as well.  [name] continues to struggle with coping with the tasks he can no longer do as a result of his health issues.  He also continues to be concerned about his spouse's health issues; but admits he is coping better overall.  And he looks forward to completion of this program so he and his spouse can return to fit  for Life classes together.  Counselor and staff will continue to follow with [place].        Vocational Rehabilitation: Provide vocational rehab assistance to qualifying candidates.   Vocational Rehab Evaluation & Intervention:     Vocational Rehab - 04/01/17 1635      Initial Vocational Rehab Evaluation & Intervention   Assessment shows need for Vocational Rehabilitation No      Education: Education Goals: Education classes will be provided on a weekly basis, covering required topics. Participant will state understanding/return demonstration of topics presented.  Learning Barriers/Preferences:     Learning Barriers/Preferences - 04/01/17 1634      Learning Barriers/Preferences   Learning Barriers Sight   Learning Preferences None      Education Topics: General Nutrition Guidelines/Fats and Fiber: -Group instruction provided by verbal, written material, models and posters to present the  general guidelines for heart healthy nutrition. Gives an explanation and review of dietary fats and fiber.   Controlling Sodium/Reading Food Labels: -Group verbal and written material supporting the discussion of sodium use in heart healthy nutrition. Review and explanation with models, verbal and written materials for utilization of the food label.   Cardiac Rehab from 12/15/2016 in [facility] Cardiac and Pulmonary Rehab  Date  09/22/16  Educator  PI  Instruction Review Code  2- meets goals/outcomes      Exercise Physiology & Risk Factors: - Group verbal and written instruction with models to review the exercise physiology of the cardiovascular system and associated critical values. Details cardiovascular disease risk factors and the goals associated with each risk factor.   Cardiac Rehab from 12/15/2016 in [facility] Cardiac and Pulmonary Rehab  Date  11/24/16  Educator  [facility]  Instruction Review Code  2- meets goals/outcomes      Aerobic Exercise & Resistance Training: - Gives group verbal and written discussion on the health impact of inactivity. On the components of aerobic and resistive training programs and the benefits of this training and how to safely progress through these programs.   Cardiac Rehab from 06/15/2017 in [facility] Cardiac and Pulmonary Rehab  Date  05/13/17  Educator  AS  Instruction Review Code  2- meets goals/outcomes      Flexibility, Balance, General Exercise Guidelines: - Provides group verbal and written instruction on the benefits of flexibility and balance training programs. Provides general exercise guidelines with specific guidelines to those with heart or lung disease. Demonstration and skill practice provided.   Cardiac Rehab from 06/15/2017 in [facility] Cardiac and Pulmonary Rehab  Date  05/18/17  Educator  AS  Instruction Review Code  2- meets goals/outcomes      Stress Management: - Provides group verbal and written instruction about the health risks of elevated  stress, cause of high stress, and healthy ways to reduce stress.   Cardiac Rehab from 06/15/2017 in [facility] Cardiac and Pulmonary Rehab  Date  05/27/17  Educator  [facility]  Instruction Review Code  2- meets goals/outcomes      Depression: - Provides group verbal and written instruction on the correlation between heart/lung disease and depressed mood, treatment options, and the stigmas associated with seeking treatment.   Cardiac Rehab from 12/15/2016 in [facility] Cardiac and Pulmonary Rehab  Date  09/17/16  Educator  Berle Mull, MSW  Instruction Review Code  2- meets goals/outcomes      Anatomy & Physiology of the Heart: - Group verbal and written instruction and models provide basic cardiac anatomy and physiology, with the coronary electrical and arterial  systems. Review of: AMI, Angina, Valve disease, Heart Failure, Cardiac Arrhythmia, Pacemakers, and the ICD.   Cardiac Rehab from 12/15/2016 in [facility] Cardiac and Pulmonary Rehab  Date  12/08/16  Educator  C. [place]  Instruction Review Code  2- meets goals/outcomes      Cardiac Procedures: - Group verbal and written instruction and models to describe the testing methods done to diagnose heart disease. Reviews the outcomes of the test results. Describes the treatment choices: Medical Management, Angioplasty, or Coronary Bypass Surgery.   Cardiac Rehab from 06/15/2017 in [facility] Cardiac and Pulmonary Rehab  Date  04/06/17  Educator  SB  Instruction Review Code  2- meets goals/outcomes      Cardiac Medications: - Group verbal and written instruction to review commonly prescribed medications for heart disease. Reviews the medication, class of the drug, and side effects. Includes the steps to properly store meds and maintain the prescription regimen.   Cardiac Rehab from 06/15/2017 in [facility] Cardiac and Pulmonary Rehab  Date  06/08/17 [Part 2 06/10/17]  Educator  MJA, RN [Part 2 SB]  Instruction Review Code  2- meets goals/outcomes      Go  Sex-Intimacy & Heart Disease, Get SMART - Goal Setting: - Group verbal and written instruction through game format to discuss heart disease and the return to sexual intimacy. Provides group verbal and written material to discuss and apply goal setting through the application of the S.M.A.R.T. Method.   Cardiac Rehab from 06/15/2017 in [facility] Cardiac and Pulmonary Rehab  Date  04/06/17  Educator  SB  Instruction Review Code  2- meets goals/outcomes      Other Matters of the Heart: - Provides group verbal, written materials and models to describe Heart Failure, Angina, Valve Disease, and Diabetes in the realm of heart disease. Includes description of the disease process and treatment options available to the cardiac patient.   Cardiac Rehab from 12/15/2016 in [facility] Cardiac and Pulmonary Rehab  Date  12/08/16  Educator  C. [place]  Instruction Review Code  2- meets goals/outcomes      Exercise & Equipment Safety: - Individual verbal instruction and demonstration of equipment use and safety with use of the equipment.   Cardiac Rehab from 06/15/2017 in [facility] Cardiac and Pulmonary Rehab  Date  04/01/17  Educator  C. EnterkinRN  Instruction Review Code  1- partially meets, needs review/practice      Infection Prevention: - Provides verbal and written material to individual with discussion of infection control including proper hand washing and proper equipment cleaning during exercise session.   Cardiac Rehab from 06/15/2017 in [facility] Cardiac and Pulmonary Rehab  Date  04/01/17  Educator  C. EnterkinRN  Instruction Review Code  2- meets goals/outcomes      Falls Prevention: - Provides verbal and written material to individual with discussion of falls prevention and safety.   Cardiac Rehab from 06/15/2017 in [facility] Cardiac and Pulmonary Rehab  Date  04/01/17  Educator  C. [place]  Instruction Review Code  2- meets goals/outcomes      Diabetes: - Individual verbal and written instruction  to review signs/symptoms of diabetes, desired ranges of glucose level fasting, after meals and with exercise. Advice that pre and post exercise glucose checks will be done for 3 sessions at entry of program.   Cardiac Rehab from 06/15/2017 in [facility] Cardiac and Pulmonary Rehab  Date  04/01/17  Educator  C. [place]  Instruction Review Code  1- partially meets, needs review/practice  Knowledge Questionnaire Score:     Knowledge Questionnaire Score - 04/01/17 1634      Knowledge Questionnaire Score   Pre Score 16      Core Components/Risk Factors/Patient Goals at Admission:     Personal Goals and Risk Factors at Admission - 04/01/17 1645      Core Components/Risk Factors/Patient Goals on Admission   Admit Weight 300 lb (136.1 kg)   Goal Weight: Short Term 295 lb (133.8 kg)   Goal Weight: Long Term 220 lb (99.8 kg)   Expected Outcomes Short Term: Continue to assess and modify interventions until short term weight is achieved;Weight Loss: Understanding of general recommendations for a balanced deficit meal plan, which promotes 1-2 lb weight loss per week and includes a negative energy balance of [phone] kcal/d   Diabetes Yes   Intervention Provide education about signs/symptoms and action to take for hypo/hyperglycemia.;Provide education about proper nutrition, including hydration, and aerobic/resistive exercise prescription along with prescribed medications to achieve blood glucose in normal ranges: Fasting glucose 65-99 mg/dL   Expected Outcomes Short Term: Participant verbalizes understanding of the signs/symptoms and immediate care of hyper/hypoglycemia, proper foot care and importance of medication, aerobic/resistive exercise and nutrition plan for blood glucose control.;Long Term: Attainment of HbA1C < 7%.   Hypertension Yes   Intervention Provide education on lifestyle modifcations including regular physical activity/exercise, weight management, moderate sodium restriction  and increased consumption of fresh fruit, vegetables, and low fat dairy, alcohol moderation, and smoking cessation.;Monitor prescription use compliance.   Expected Outcomes Short Term: Continued assessment and intervention until BP is < 140/85m HG in hypertensive participants. < 130/856mHG in hypertensive participants with diabetes, heart failure or chronic kidney disease.;Long Term: Maintenance of blood pressure at goal levels.   Lipids Yes   Intervention Provide education and support for participant on nutrition & aerobic/resistive exercise along with prescribed medications to achieve LDL <7026mHDL >33m8m Expected Outcomes Short Term: Participant states understanding of desired cholesterol values and is compliant with medications prescribed. Participant is following exercise prescription and nutrition guidelines.;Long Term: Cholesterol controlled with medications as prescribed, with individualized exercise RX and with personalized nutrition plan. Value goals: LDL < 70mg37mL > 40 mg.      Core Components/Risk Factors/Patient Goals Review:      Goals and Risk Factor Review    Row Name 05/29/17 0918             Core Components/Risk Factors/Patient Goals Review   Personal Goals Review Weight Management/Obesity;Diabetes       Review WaynePatrick Jupiterlost about 2-3 lb.  His wife took all the candy out of the house.  His BG is still out of whack at times but it is staying under 250.  His goal is to attend three days per week exercise on a regular basis.         Expected Outcomes [place] continue to monitor his BG and weight.  [place] have stable BG and lose weight.          Core Components/Risk Factors/Patient Goals at Discharge (Final Review):      Goals and Risk Factor Review - 05/29/17 0918      Core Components/Risk Factors/Patient Goals Review   Personal Goals Review Weight Management/Obesity;Diabetes   Review WaynePatrick Jupiterlost about 2-3 lb.  His wife took all the  candy out of the house.  His BG is still out of whack at times but it is staying under  250.  His goal is to attend three days per week exercise on a regular basis.     Expected Outcomes [place] will continue to monitor his BG and weight.  [place] will have stable BG and lose weight.      ITP Comments:     ITP Comments    Row Name 04/01/17 1643 04/09/17 1720 04/14/17 1209 04/22/17 0556 04/29/17 1353   ITP Comments  Medical review started after informed consent was signed for Cardiac REhab then ITP was created. Diagnosis in CHL/EPIC. [place] had chest pain during exercise.  It resolved with rest and he completed the session with no other issues. [place] continues to have angina symptoms with exercise and at home at rest.  Reviewing his meds with him, we discovered he ahd been on Renexa and has been off since one of his hospitalizations.  I advised [name] to call his physician and ask about resumong the Renexa for possible symptom control.  30 day review. Continue with ITP unless directed changes per Medical Director review Called to check on status of pt.  He was admitted for chest pain. All labs were negative and they cancelled his cath until after his follow up as outpatient.  He has a follow up appointment on July 9th.  He will need clearance to return to rehab.   [place] Name 05/20/17 0655 05/28/17 1036 06/17/17 0628       ITP Comments 30 day review. Continue with ITP unless directed changes per Medical Director review     Med changes have helped with symptom control per [facility] exercise was reviewed with [facility] 7/25.  HR RPE and safety were reviewed.  Pt voiced understanding. 30 day review. Continue with ITP unless directed changes per Medical Director review         Comments:

## 2017-06-17 NOTE — Progress Notes (Signed)
Incomplete Session Note  Patient Details  Name: Lawrence [name] MRN: [id number] Date of Birth: 05-23-1951 Referring Provider:     Cardiac Rehab from 04/01/2017 in [facility] Cardiac and Pulmonary Rehab  Referring Provider  Josephina Gip MD      [name] did not complete his rehab session.  [name] blood sugar was checked before exercise and was 86. He was given a snack and sent home. [name] is to resume Thursday.

## 2017-06-18 IMAGING — CR DG CHEST 2V
2 series · 2 of 2 positions shown · non-contrast
Comparison: Portable chest x-ray June 29, 2016

CLINICAL DATA: Four days out from CABG.

EXAM:
CHEST  2 VIEW

[chest lat]
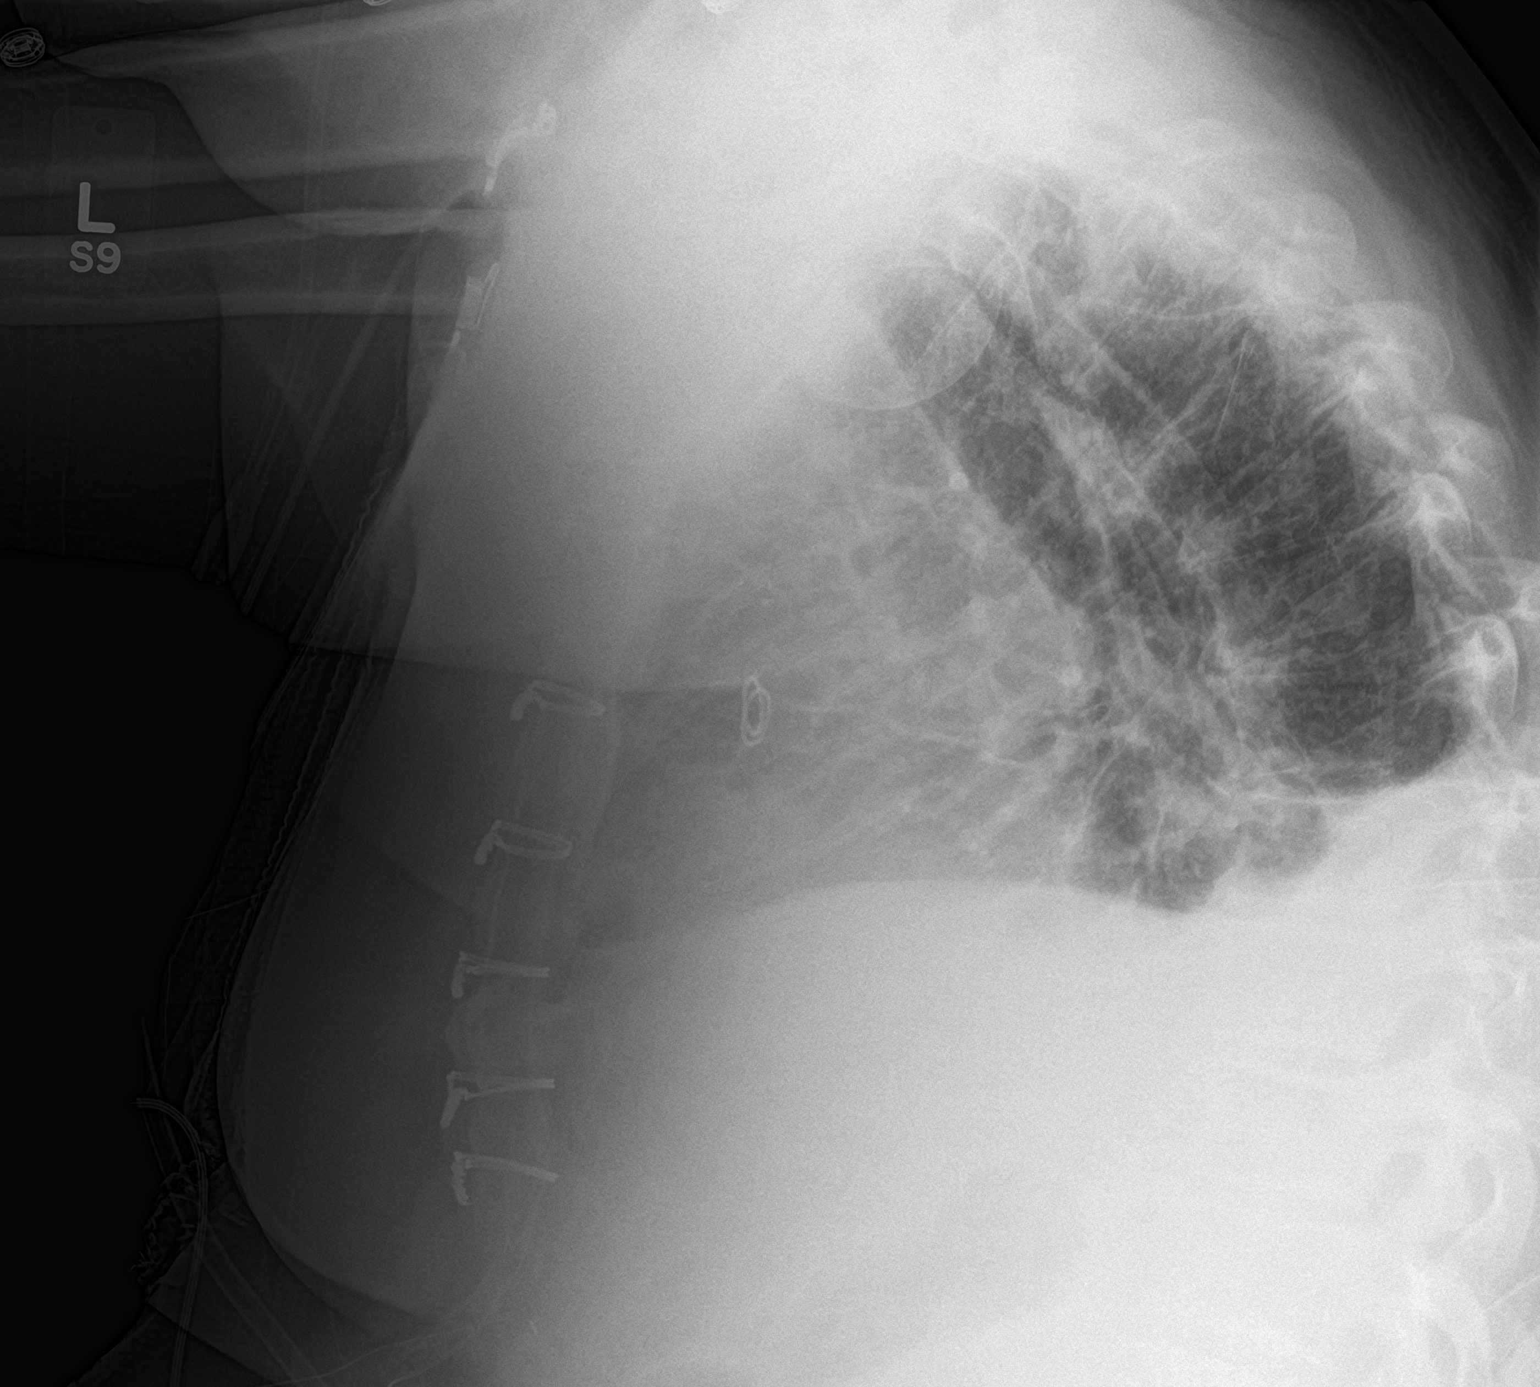

[chest ap]
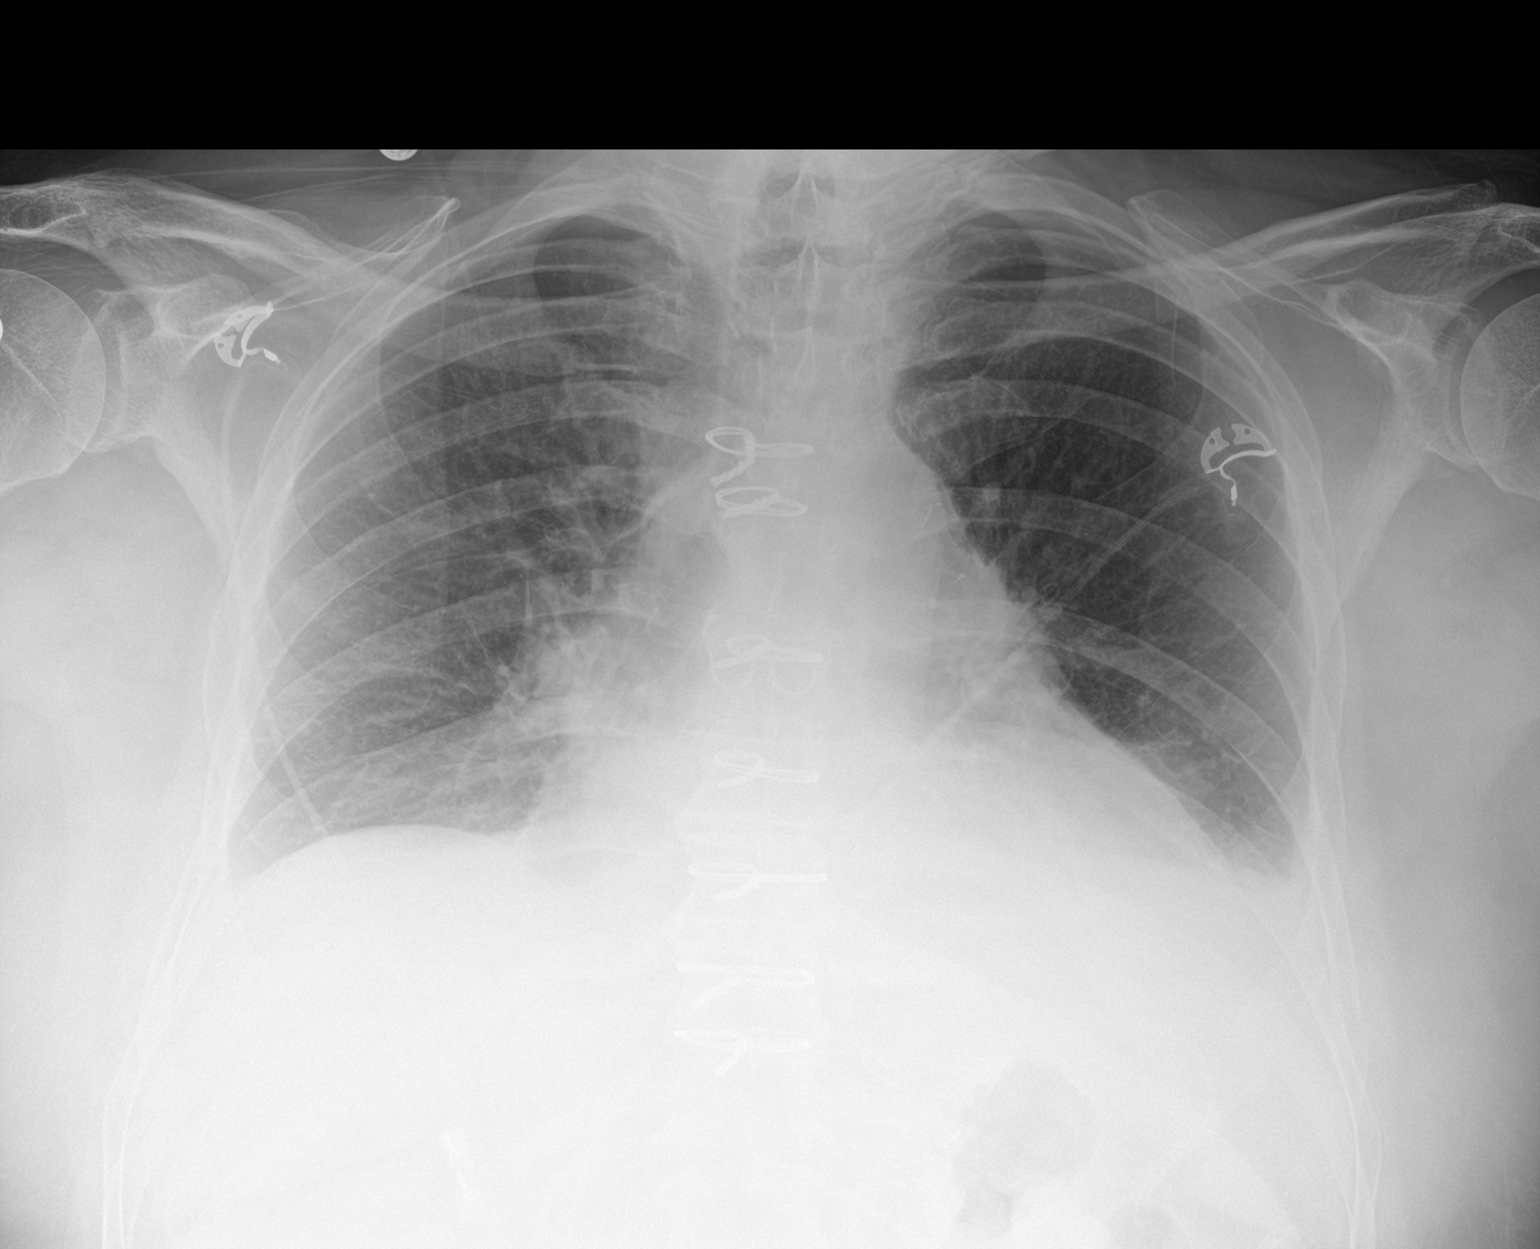

[2 of 2 positions shown; findings below may reference images not displayed]

FINDINGS: The lungs remain hypoinflated. The pulmonary interstitium has
improved. The cardiac silhouette remains enlarged. The pulmonary
vascularity is less engorged. There are bilateral pleural effusions
layering posteriorly. There is no pneumothorax.
IMPRESSION: Decreased pulmonary interstitial edema. Persistent bilateral pleural
effusions layering posteriorly. Cardiomegaly with only mild central
pulmonary vascular congestion.

## 2017-06-19 ENCOUNTER — Ambulatory Visit (INDEPENDENT_AMBULATORY_CARE_PROVIDER_SITE_OTHER): Payer: Medicare Other | Admitting: Podiatry

## 2017-06-19 ENCOUNTER — Encounter: Payer: Self-pay | Admitting: Podiatry

## 2017-06-19 DIAGNOSIS — B351 Tinea unguium: Secondary | ICD-10-CM | POA: Diagnosis not present

## 2017-06-19 DIAGNOSIS — E0842 Diabetes mellitus due to underlying condition with diabetic polyneuropathy: Secondary | ICD-10-CM

## 2017-06-19 DIAGNOSIS — M79676 Pain in unspecified toe(s): Secondary | ICD-10-CM | POA: Diagnosis not present

## 2017-06-20 NOTE — Progress Notes (Signed)
   SUBJECTIVE Patient with a history of diabetes mellitus presents to office today complaining of elongated, thickened nails. Pain while ambulating in shoes. Patient is unable to trim their own nails.   OBJECTIVE General Patient is awake, alert, and oriented x 3 and in no acute distress. Derm Skin is dry and supple bilateral. Negative open lesions or macerations. Remaining integument unremarkable. Nails are tender, long, thickened and dystrophic with subungual debris, consistent with onychomycosis, 1-5 bilateral. No signs of infection noted. Vasc  DP and PT pedal pulses palpable bilaterally. Temperature gradient within normal limits.  Neuro Epicritic and protective threshold sensation diminished bilaterally.  Musculoskeletal Exam No symptomatic pedal deformities noted bilateral. Muscular strength within normal limits.  ASSESSMENT 1. Diabetes Mellitus w/ peripheral neuropathy 2. Onychomycosis of nail due to dermatophyte bilateral 3. Pain in foot bilateral  PLAN OF CARE 1. Patient evaluated today. 2. Instructed to maintain good pedal hygiene and foot care. Stressed importance of controlling blood sugar.  3. Mechanical debridement of nails 1-5 bilaterally performed using a nail nipper. Filed with dremel without incident.  4. Return to clinic in 3 mos.     Brent M. Evans, DPM [facility]  Dr. Brent M. Evans, DPM    [address]                                        [place], [place] [address]                Office [phone]  Fax [phone]       

## 2017-06-24 ENCOUNTER — Encounter: Payer: Medicare Other | Admitting: *Deleted

## 2017-06-24 DIAGNOSIS — Z951 Presence of aortocoronary bypass graft: Secondary | ICD-10-CM

## 2017-06-24 DIAGNOSIS — Z794 Long term (current) use of insulin: Secondary | ICD-10-CM | POA: Diagnosis not present

## 2017-06-24 DIAGNOSIS — Z7902 Long term (current) use of antithrombotics/antiplatelets: Secondary | ICD-10-CM | POA: Diagnosis not present

## 2017-06-24 DIAGNOSIS — Z7982 Long term (current) use of aspirin: Secondary | ICD-10-CM | POA: Diagnosis not present

## 2017-06-24 DIAGNOSIS — Z79899 Other long term (current) drug therapy: Secondary | ICD-10-CM | POA: Diagnosis not present

## 2017-06-24 DIAGNOSIS — I214 Non-ST elevation (NSTEMI) myocardial infarction: Secondary | ICD-10-CM | POA: Diagnosis not present

## 2017-06-24 DIAGNOSIS — I2510 Atherosclerotic heart disease of native coronary artery without angina pectoris: Secondary | ICD-10-CM | POA: Diagnosis not present

## 2017-06-24 NOTE — Progress Notes (Signed)
Daily Session Note  Patient Details  Name: EDDY LISZEWSKI MRN: [id number] Date of Birth: [birth year] Referring Provider:     Cardiac Rehab from 04/01/2017 in [facility] Cardiac and Pulmonary Rehab  Referring Provider  Josephina Gip MD      Encounter Date: 06/24/2017  Check In:     Session Check In - 06/24/17 1742      Check-In   Location [facility]-Cardiac & Pulmonary Rehab   Staff Present Renita Papa, RN Vickki Hearing, BA, ACSM CEP, Exercise Physiologist;Carroll Enterkin, RN, BSN   Supervising physician immediately available to respond to emergencies See telemetry face sheet for immediately available ER MD   Medication changes reported     No   Fall or balance concerns reported    No   Warm-up and Cool-down Performed on first and last piece of equipment   Resistance Training Performed Yes   VAD Patient? No     Pain Assessment   Currently in Pain? No/denies         History  Smoking Status  . Former Smoker  . Packs/day: 1.00  . Years: 16.00  . Types: Cigarettes  . Start date: 11/03/1969  . Quit date: 07/22/1986  Smokeless Tobacco  . Never Used    Goals Met:  Proper associated with RPD/PD & O2 Sat Independence with exercise equipment Exercise tolerated well No report of cardiac concerns or symptoms Strength training completed today  Goals Unmet:  Not Applicable  Comments: Pt able to follow exercise prescription today without complaint.  Will continue to monitor for progression.    Dr. Emily Filbert is Medical Director for [place] and [facility].

## 2017-06-25 DIAGNOSIS — I214 Non-ST elevation (NSTEMI) myocardial infarction: Secondary | ICD-10-CM | POA: Diagnosis not present

## 2017-06-25 DIAGNOSIS — I2510 Atherosclerotic heart disease of native coronary artery without angina pectoris: Secondary | ICD-10-CM | POA: Diagnosis not present

## 2017-06-25 DIAGNOSIS — Z794 Long term (current) use of insulin: Secondary | ICD-10-CM | POA: Diagnosis not present

## 2017-06-25 DIAGNOSIS — Z7902 Long term (current) use of antithrombotics/antiplatelets: Secondary | ICD-10-CM | POA: Diagnosis not present

## 2017-06-25 DIAGNOSIS — Z79899 Other long term (current) drug therapy: Secondary | ICD-10-CM | POA: Diagnosis not present

## 2017-06-25 DIAGNOSIS — Z951 Presence of aortocoronary bypass graft: Secondary | ICD-10-CM

## 2017-06-25 DIAGNOSIS — Z7982 Long term (current) use of aspirin: Secondary | ICD-10-CM | POA: Diagnosis not present

## 2017-06-25 LAB — GLUCOSE, CAPILLARY: Glucose-Capillary: 151 mg/dL — ABNORMAL HIGH (ref 65–99)

## 2017-06-25 NOTE — Progress Notes (Signed)
Daily Session Note  Patient Details  Name: Lawrence [name] MRN: [id number] Date of Birth: 03-24-1951 Referring Provider:     Cardiac Rehab from 04/01/2017 in [facility] Cardiac and Pulmonary Rehab  Referring Provider  Josephina Gip MD      Encounter Date: 06/25/2017  Check In:     Session Check In - 06/25/17 1610      Check-In   Location [facility]-Cardiac & Pulmonary Rehab   Staff Present Gerlene Burdock, RN, Moises Blood, BS, ACSM CEP, Exercise Physiologist;Janaiya [place]ne Flavia Shipper   Supervising physician immediately available to respond to emergencies See telemetry face sheet for immediately available ER MD   Medication changes reported     No   Fall or balance concerns reported    No   Warm-up and Cool-down Performed on first and last piece of equipment   Resistance Training Performed Yes   VAD Patient? No     Pain Assessment   Currently in Pain? No/denies   Multiple Pain Sites No         History  Smoking Status  . Former Smoker  . Packs/day: 1.00  . Years: 16.00  . Types: Cigarettes  . Start date: 11/03/1969  . Quit date: 07/22/1986  Smokeless Tobacco  . Never Used    Goals Met:  Independence with exercise equipment Exercise tolerated well Strength training completed today  Goals Unmet:  Not Applicable  Comments: [name] felt dizzy during exercise. Exercise stopped after he felt dizzy, resumed after drinking water and dizziness subsided.  His blood pressure was 102/60 and his blood sugar was 151. When asked after class he states a little dizziness is there. Will continue to monitor patient.  Dr. Emily Filbert is Medical Director for [place] and [facility].

## 2017-06-26 ENCOUNTER — Other Ambulatory Visit: Payer: Self-pay | Admitting: Otolaryngology

## 2017-06-26 DIAGNOSIS — R42 Dizziness and giddiness: Secondary | ICD-10-CM | POA: Diagnosis not present

## 2017-07-01 ENCOUNTER — Encounter: Payer: Medicare Other | Admitting: *Deleted

## 2017-07-01 DIAGNOSIS — I2510 Atherosclerotic heart disease of native coronary artery without angina pectoris: Secondary | ICD-10-CM | POA: Diagnosis not present

## 2017-07-01 DIAGNOSIS — Z7982 Long term (current) use of aspirin: Secondary | ICD-10-CM | POA: Diagnosis not present

## 2017-07-01 DIAGNOSIS — Z794 Long term (current) use of insulin: Secondary | ICD-10-CM | POA: Diagnosis not present

## 2017-07-01 DIAGNOSIS — I214 Non-ST elevation (NSTEMI) myocardial infarction: Secondary | ICD-10-CM | POA: Diagnosis not present

## 2017-07-01 DIAGNOSIS — Z7902 Long term (current) use of antithrombotics/antiplatelets: Secondary | ICD-10-CM | POA: Diagnosis not present

## 2017-07-01 DIAGNOSIS — Z951 Presence of aortocoronary bypass graft: Secondary | ICD-10-CM

## 2017-07-01 DIAGNOSIS — Z79899 Other long term (current) drug therapy: Secondary | ICD-10-CM | POA: Diagnosis not present

## 2017-07-01 NOTE — Progress Notes (Signed)
Daily Session Note  Patient Details  Name: Lawrence [name] MRN: [id number] Date of Birth: [birth year] Referring Provider:     Cardiac Rehab from 04/01/2017 in [facility] Cardiac and Pulmonary Rehab  Referring Provider  Josephina Gip MD      Encounter Date: 07/01/2017  Check In:     Session Check In - 07/01/17 1617      Check-In   Location [facility]-Cardiac & Pulmonary Rehab   Staff Present Gerlene Burdock, RN, Vickki Hearing, BA, ACSM CEP, Exercise Physiologist;[place] Kellie Shropshire, RN, BSN, MA   Supervising physician immediately available to respond to emergencies See telemetry face sheet for immediately available ER MD   Medication changes reported     No   Fall or balance concerns reported    No   Tobacco Cessation No Change   Warm-up and Cool-down Performed on first and last piece of equipment   Resistance Training Performed Yes   VAD Patient? No     Pain Assessment   Currently in Pain? No/denies         History  Smoking Status  . Former Smoker  . Packs/day: 1.00  . Years: 16.00  . Types: Cigarettes  . Start date: 11/03/1969  . Quit date: 07/22/1986  Smokeless Tobacco  . Never Used    Goals Met:  Proper associated with RPD/PD & O2 Sat Exercise tolerated well No report of cardiac concerns or symptoms Strength training completed today  Goals Unmet:  Not Applicable  Comments:     Dr. Emily Filbert is Medical Director for [place] and [facility].

## 2017-07-02 ENCOUNTER — Ambulatory Visit
Admission: RE | Admit: 2017-07-02 | Discharge: 2017-07-02 | Disposition: A | Discharge status: Home / Self Care | Payer: Medicare Other | Source: Ambulatory Visit | Attending: Otolaryngology | Admitting: Otolaryngology

## 2017-07-02 DIAGNOSIS — R42 Dizziness and giddiness: Secondary | ICD-10-CM | POA: Insufficient documentation

## 2017-07-02 DIAGNOSIS — Z7982 Long term (current) use of aspirin: Secondary | ICD-10-CM | POA: Diagnosis not present

## 2017-07-02 DIAGNOSIS — Z794 Long term (current) use of insulin: Secondary | ICD-10-CM | POA: Diagnosis not present

## 2017-07-02 DIAGNOSIS — Z79899 Other long term (current) drug therapy: Secondary | ICD-10-CM | POA: Diagnosis not present

## 2017-07-02 DIAGNOSIS — Z7902 Long term (current) use of antithrombotics/antiplatelets: Secondary | ICD-10-CM | POA: Diagnosis not present

## 2017-07-02 DIAGNOSIS — I214 Non-ST elevation (NSTEMI) myocardial infarction: Secondary | ICD-10-CM | POA: Diagnosis not present

## 2017-07-02 DIAGNOSIS — I2510 Atherosclerotic heart disease of native coronary artery without angina pectoris: Secondary | ICD-10-CM | POA: Diagnosis not present

## 2017-07-02 LAB — POCT I-STAT CREATININE: Creatinine, Ser: 1.1 mg/dL (ref 0.61–1.24)

## 2017-07-02 MED ORDER — GADOBENATE DIMEGLUMINE 529 MG/ML IV SOLN
20.0000 mL | Freq: Once | INTRAVENOUS | Status: AC | PRN
  Administered 2017-07-02: 20 mL via INTRAVENOUS

## 2017-07-02 NOTE — Progress Notes (Signed)
Daily Session Note  Patient Details  Name: Lawrence [name] MRN: [id number] Date of Birth: 02-18-1951 Referring Provider:     Cardiac Rehab from 04/01/2017 in [facility] Cardiac and Pulmonary Rehab  Referring Provider  Josephina Gip MD      Encounter Date: 07/02/2017  Check In:     Session Check In - 07/02/17 1652      Check-In   Location [facility]-Cardiac & Pulmonary Rehab   Staff Present Gerlene Burdock, RN, Moises Blood, BS, ACSM CEP, Exercise Physiologist;Joseph Flavia Shipper   Supervising physician immediately available to respond to emergencies See telemetry face sheet for immediately available ER MD   Medication changes reported     No   Fall or balance concerns reported    No   Warm-up and Cool-down Performed on first and last piece of equipment   Resistance Training Performed Yes   VAD Patient? No     Pain Assessment   Currently in Pain? No/denies   Multiple Pain Sites No         History  Smoking Status  . Former Smoker  . Packs/day: 1.00  . Years: 16.00  . Types: Cigarettes  . Start date: 11/03/1969  . Quit date: 07/22/1986  Smokeless Tobacco  . Never Used    Goals Met:  Independence with exercise equipment Exercise tolerated well No report of cardiac concerns or symptoms Strength training completed today  Goals Unmet:  Not Applicable  Comments: Pt able to follow exercise prescription today without complaint.  Will continue to monitor for progression.   Dr. Emily Filbert is Medical Director for [place] and [facility].

## 2017-07-08 ENCOUNTER — Encounter: Payer: Medicare Other | Attending: Cardiovascular Disease | Admitting: *Deleted

## 2017-07-08 DIAGNOSIS — I2510 Atherosclerotic heart disease of native coronary artery without angina pectoris: Secondary | ICD-10-CM | POA: Insufficient documentation

## 2017-07-08 DIAGNOSIS — Z7902 Long term (current) use of antithrombotics/antiplatelets: Secondary | ICD-10-CM | POA: Insufficient documentation

## 2017-07-08 DIAGNOSIS — Z794 Long term (current) use of insulin: Secondary | ICD-10-CM | POA: Diagnosis not present

## 2017-07-08 DIAGNOSIS — Z7982 Long term (current) use of aspirin: Secondary | ICD-10-CM | POA: Insufficient documentation

## 2017-07-08 DIAGNOSIS — Z87891 Personal history of nicotine dependence: Secondary | ICD-10-CM | POA: Insufficient documentation

## 2017-07-08 DIAGNOSIS — E119 Type 2 diabetes mellitus without complications: Secondary | ICD-10-CM | POA: Diagnosis not present

## 2017-07-08 DIAGNOSIS — Z951 Presence of aortocoronary bypass graft: Secondary | ICD-10-CM

## 2017-07-08 DIAGNOSIS — Z79899 Other long term (current) drug therapy: Secondary | ICD-10-CM | POA: Insufficient documentation

## 2017-07-08 DIAGNOSIS — I214 Non-ST elevation (NSTEMI) myocardial infarction: Secondary | ICD-10-CM

## 2017-07-08 NOTE — Progress Notes (Signed)
Daily Session Note  Patient Details  Name: TIELER COURNOYER MRN: [id number] Date of Birth: [birth year] Referring Provider:     Cardiac Rehab from 04/01/2017 in [facility] Cardiac and Pulmonary Rehab  Referring Provider  Josephina Gip MD      Encounter Date: 07/08/2017  Check In:     Session Check In - 07/08/17 1647      Check-In   Location [facility]-Cardiac & Pulmonary Rehab   Staff Present Gerlene Burdock, RN, Vickki Hearing, BA, ACSM CEP, Exercise Physiologist;Meredith Sherryll Burger, RN BSN   Supervising physician immediately available to respond to emergencies See telemetry face sheet for immediately available ER MD   Medication changes reported     No   Fall or balance concerns reported    No   Tobacco Cessation No Change   Warm-up and Cool-down Performed on first and last piece of equipment   Resistance Training Performed Yes   VAD Patient? No     Pain Assessment   Currently in Pain? No/denies         History  Smoking Status  . Former Smoker  . Packs/day: 1.00  . Years: 16.00  . Types: Cigarettes  . Start date: 11/03/1969  . Quit date: 07/22/1986  Smokeless Tobacco  . Never Used    Goals Met:  Proper associated with RPD/PD & O2 Sat Exercise tolerated well No report of cardiac concerns or symptoms Strength training completed today  Goals Unmet:  Not Applicable  Comments:     Dr. Emily Filbert is Medical Director for [place] and [facility].

## 2017-07-09 VITALS — Ht 70.8 in | Wt 299.3 lb

## 2017-07-09 DIAGNOSIS — Z951 Presence of aortocoronary bypass graft: Secondary | ICD-10-CM

## 2017-07-09 DIAGNOSIS — Z7902 Long term (current) use of antithrombotics/antiplatelets: Secondary | ICD-10-CM | POA: Diagnosis not present

## 2017-07-09 DIAGNOSIS — I2510 Atherosclerotic heart disease of native coronary artery without angina pectoris: Secondary | ICD-10-CM | POA: Diagnosis not present

## 2017-07-09 DIAGNOSIS — I214 Non-ST elevation (NSTEMI) myocardial infarction: Secondary | ICD-10-CM | POA: Diagnosis not present

## 2017-07-09 DIAGNOSIS — Z7982 Long term (current) use of aspirin: Secondary | ICD-10-CM | POA: Diagnosis not present

## 2017-07-09 DIAGNOSIS — Z794 Long term (current) use of insulin: Secondary | ICD-10-CM | POA: Diagnosis not present

## 2017-07-09 DIAGNOSIS — Z79899 Other long term (current) drug therapy: Secondary | ICD-10-CM | POA: Diagnosis not present

## 2017-07-09 LAB — GLUCOSE, CAPILLARY: GLUCOSE-CAPILLARY: 224 mg/dL — AB (ref 65–99)

## 2017-07-09 NOTE — Progress Notes (Signed)
Daily Session Note  Patient Details  Name: Lawrence [name] MRN: [id number] Date of Birth: 1951-07-23 Referring Provider:     Cardiac Rehab from 04/01/2017 in [facility] Cardiac and Pulmonary Rehab  Referring Provider  Sumner Boast MD      Encounter Date: 07/09/2017  Check In:     Session Check In - 07/09/17 1630      Check-In   Location [facility]-Cardiac & Pulmonary Rehab   Staff Present Virgina Organ, RN, [place] Lanius, BA, ACSM CEP, Exercise Physiologist;Kelly Madilyn Fireman, BS, ACSM CEP, Exercise Physiologist;[place] Nies Hollace Kinnier   Supervising physician immediately available to respond to emergencies See telemetry face sheet for immediately available ER MD   Medication changes reported     No   Fall or balance concerns reported    No   Warm-up and Cool-down Performed on first and last piece of equipment   Resistance Training Performed Yes   VAD Patient? No     Pain Assessment   Currently in Pain? No/denies   Multiple Pain Sites No           Exercise Prescription Changes - 07/09/17 1100      Response to Exercise   Blood Pressure (Admit) 132/62   Blood Pressure (Exercise) 126/62   Blood Pressure (Exit) 110/54   Heart Rate (Admit) 90 bpm   Heart Rate (Exercise) 116 bpm   Heart Rate (Exit) 84 bpm   Rating of Perceived Exertion (Exercise) 15   Symptoms none   Duration Progress to 45 minutes of aerobic exercise without signs/symptoms of physical distress   Intensity THRR unchanged     Progression   Progression Continue to progress workloads to maintain intensity without signs/symptoms of physical distress.   Average METs 2.4     Resistance Training   Training Prescription Yes   Weight 3   Reps 10-15     Interval Training   Interval Training No     Treadmill   MPH 2   Grade 1   Minutes 15   METs 2.81      History  Smoking Status  . Former Smoker  . Packs/day: 1.00  . Years: 16.00  . Types: Cigarettes  . Start date: 11/03/1969  . Quit date: 07/22/1986   Smokeless Tobacco  . Never Used    Goals Met:  Independence with exercise equipment Exercise tolerated well No report of cardiac concerns or symptoms Strength training completed today  Goals Unmet:  Not Applicable  Comments:      6 Minute Walk    Row Name 04/01/17 1509 07/09/17 1651       6 Minute Walk   Phase Initial Discharge    Distance 1200 feet 1400 feet    Distance % Change  - 17 %    Distance Feet Change  - 200 ft    Walk Time 6 minutes 6 minutes    # of Rest Breaks 0 0    MPH 2.27 2.65    METS 2.22 2.46    RPE 12 16    VO2 Peak 7.76 8.4    Symptoms No Yes (comment)    Comments  - chest tightness 8/10    Resting HR 82 bpm 85 bpm    Resting BP 124/70 132/80    Resting Oxygen Saturation   - 98 %    Max Ex. HR 110 bpm 116 bpm    Max Ex. BP 152/64 128/58     Pt able to follow exercise prescription today without complaint.  Will continue to monitor for progression.   Dr. Bethann Punches is Medical Director for [facility] Cardiac Rehabilitation and [facility].

## 2017-07-13 DIAGNOSIS — Z794 Long term (current) use of insulin: Secondary | ICD-10-CM | POA: Diagnosis not present

## 2017-07-13 DIAGNOSIS — Z79899 Other long term (current) drug therapy: Secondary | ICD-10-CM | POA: Diagnosis not present

## 2017-07-13 DIAGNOSIS — I214 Non-ST elevation (NSTEMI) myocardial infarction: Secondary | ICD-10-CM

## 2017-07-13 DIAGNOSIS — Z951 Presence of aortocoronary bypass graft: Secondary | ICD-10-CM

## 2017-07-13 DIAGNOSIS — Z7902 Long term (current) use of antithrombotics/antiplatelets: Secondary | ICD-10-CM | POA: Diagnosis not present

## 2017-07-13 DIAGNOSIS — Z7982 Long term (current) use of aspirin: Secondary | ICD-10-CM | POA: Diagnosis not present

## 2017-07-13 DIAGNOSIS — I2510 Atherosclerotic heart disease of native coronary artery without angina pectoris: Secondary | ICD-10-CM | POA: Diagnosis not present

## 2017-07-13 NOTE — Progress Notes (Signed)
Daily Session Note  Patient Details  Name: Lawrence [name] MRN: [id number] Date of Birth: 08/01/1951 Referring Provider:     Cardiac Rehab from 04/01/2017 in [facility]  Referring Provider  Josephina Gip MD      Encounter Date: 07/13/2017  Check In:     Session Check In - 07/13/17 1710      Check-In   Location [facility]-Cardiac & Pulmonary Rehab   Staff Present Nada Maclachlan, BA, ACSM CEP, Exercise Physiologist;Kelly Amedeo Plenty, BS, ACSM CEP, Exercise Physiologist;Meredith Sherryll Burger, RN BSN   Supervising physician immediately available to respond to emergencies See telemetry face sheet for immediately available ER MD   Medication changes reported     No   Fall or balance concerns reported    No   Warm-up and Cool-down Performed on first and last piece of equipment   Resistance Training Performed Yes   VAD Patient? No     Pain Assessment   Currently in Pain? No/denies         History  Smoking Status  . Former Smoker  . Packs/day: 1.00  . Years: 16.00  . Types: Cigarettes  . Start date: 11/03/1969  . Quit date: 07/22/1986  Smokeless Tobacco  . Never Used    Goals Met:  Independence with exercise equipment Exercise tolerated well No report of cardiac concerns or symptoms Strength training completed today  Goals Unmet:  Not Applicable  Comments: Pt able to follow exercise prescription today without complaint.  Will continue to monitor for progression.     Dr. Emily Filbert is Medical Director for [place] and [facility].

## 2017-07-14 DIAGNOSIS — R262 Difficulty in walking, not elsewhere classified: Secondary | ICD-10-CM | POA: Diagnosis not present

## 2017-07-14 DIAGNOSIS — R2681 Unsteadiness on feet: Secondary | ICD-10-CM | POA: Diagnosis not present

## 2017-07-15 ENCOUNTER — Encounter: Payer: Medicare Other | Admitting: *Deleted

## 2017-07-15 ENCOUNTER — Encounter: Payer: Self-pay | Admitting: *Deleted

## 2017-07-15 DIAGNOSIS — Z7902 Long term (current) use of antithrombotics/antiplatelets: Secondary | ICD-10-CM | POA: Diagnosis not present

## 2017-07-15 DIAGNOSIS — Z79899 Other long term (current) drug therapy: Secondary | ICD-10-CM | POA: Diagnosis not present

## 2017-07-15 DIAGNOSIS — I214 Non-ST elevation (NSTEMI) myocardial infarction: Secondary | ICD-10-CM

## 2017-07-15 DIAGNOSIS — Z794 Long term (current) use of insulin: Secondary | ICD-10-CM | POA: Diagnosis not present

## 2017-07-15 DIAGNOSIS — Z7982 Long term (current) use of aspirin: Secondary | ICD-10-CM | POA: Diagnosis not present

## 2017-07-15 DIAGNOSIS — I2510 Atherosclerotic heart disease of native coronary artery without angina pectoris: Secondary | ICD-10-CM | POA: Diagnosis not present

## 2017-07-15 LAB — GLUCOSE, CAPILLARY: GLUCOSE-CAPILLARY: 76 mg/dL (ref 65–99)

## 2017-07-15 NOTE — Progress Notes (Signed)
Cardiac Individual Treatment Plan  Patient Details  Name: Lawrence [name] MRN: [id number] Date of Birth: [birth year] Referring Provider:     Cardiac Rehab from 04/01/2017 in [facility] Cardiac and Pulmonary Rehab  Referring Provider  Josephina Gip MD      Initial Encounter Date:    Cardiac Rehab from 04/01/2017 in [facility] Cardiac and Pulmonary Rehab  Date  04/01/17  Referring Provider  Josephina Gip MD      Visit Diagnosis: NSTEMI (non-ST elevated myocardial infarction) Lawrence [name])  Patient's Home Medications on Admission:  Current Outpatient Prescriptions:  .  aspirin 81 MG EC tablet, Take 81 mg by mouth daily. Swallow whole., Disp: , Rfl:  .  cholecalciferol (VITAMIN D) 1000 units tablet, Take 1,000 Units by mouth daily., Disp: , Rfl:  .  clopidogrel (PLAVIX) 75 MG tablet, Take 1 tablet (75 mg total) by mouth daily., Disp: 30 tablet, Rfl: 0 .  docusate sodium (COLACE) 100 MG capsule, Take 200 mg by mouth 2 (two) times daily., Disp: , Rfl:  .  fexofenadine (ALLEGRA) 180 MG tablet, Take 180 mg by mouth daily., Disp: , Rfl:  .  furosemide (LASIX) 40 MG tablet, Take 1 tablet (40 mg total) by mouth daily., Disp: 30 tablet, Rfl: 1 .  glucagon (GLUCAGON EMERGENCY) 1 MG injection, Inject 1 mg into the vein once as needed. Reported on 02/11/2016, Disp: , Rfl:  .  insulin aspart (NOVOLOG) 100 UNIT/ML injection, Inject 20 Units into the skin 3 (three) times daily. (Patient taking differently: Inject 20 Units into the skin 3 (three) times daily with meals. ), Disp: 10 mL, Rfl: 5 .  insulin glargine (LANTUS) 100 UNIT/ML injection, Inject 0.24 mLs (24 Units total) into the skin at bedtime., Disp: 30 mL, Rfl: 1 .  isosorbide mononitrate (IMDUR) 30 MG 24 hr tablet, Take 30 mg by mouth daily., Disp: , Rfl:  .  latanoprost (XALATAN) 0.005 % ophthalmic solution, Place 1 drop into both eyes at bedtime., Disp: , Rfl:  .  metoprolol tartrate (LOPRESSOR) 25 MG tablet, Take 25 mg by mouth 2 (two) times daily., Disp: ,  Rfl:  .  Multiple Vitamin (MULTIVITAMIN) capsule, Take 1 capsule by mouth daily., Disp: , Rfl:  .  nitroGLYCERIN (NITROSTAT) 0.4 MG SL tablet, Place 1 tablet (0.4 mg total) under the tongue every 5 (five) minutes as needed for chest pain., Disp: 50 tablet, Rfl: 1 .  potassium chloride SA (K-DUR,KLOR-CON) 20 MEQ tablet, Take 1 tablet (20 mEq total) by mouth daily., Disp: 30 tablet, Rfl: 1 .  ranolazine (RANEXA) 500 MG 12 hr tablet, Take 1 tablet (500 mg total) by mouth 2 (two) times daily., Disp: 60 tablet, Rfl: 0 .  rosuvastatin (CRESTOR) 20 MG tablet, Take 1 tablet (20 mg total) by mouth daily at 6 PM., Disp: 30 tablet, Rfl: 1 .  vitamin C (ASCORBIC ACID) 500 MG tablet, Take 500 mg by mouth 2 (two) times daily. , Disp: , Rfl:   Past Medical History: Past Medical History:  Diagnosis Date  . CAD (coronary artery disease)    07/2007  . Diabetes ([place])   . Elevated lipids   . Glaucoma   . HBP (high blood pressure)   . Neuropathy   . Osteoarthritis   . Skin cancer   . Sleep apnea    CPAP    Tobacco Use: History  Smoking Status  . Former Smoker  . Packs/day: 1.00  . Years: 16.00  . Types: Cigarettes  . Start date: 11/03/1969  .  Quit date: 07/22/1986  Smokeless Tobacco  . Never Used    Labs: Recent Review Flowsheet Data    Labs for ITP Cardiac and Pulmonary Rehab Latest Ref Rng & Units 06/27/2016 06/28/2016 09/08/2016 03/18/2017 04/23/2017   Cholestrol 0 - 200 mg/dL - - 164 143 137   LDLCALC 0 - 99 mg/dL - - 69 63 61   HDL >40 mg/dL - - 81.00 67 67   Trlycerides <150 mg/dL - - 69.0 63 45   Hemoglobin A1c 4.8 - 5.6 % - - 7.9(H) 7.9(H) 7.8(H)   PHART 7.350 - 7.450 7.390 - - - -   PCO2ART 35.0 - 45.0 mmHg 45.2(H) - - - -   HCO3 20.0 - 24.0 mEq/L 27.3(H) - - - -   TCO2 0 - 100 mmol/L 29 26 - - -   O2SAT % 96.0 - - - -       Exercise Target Goals:    Exercise Program Goal: Individual exercise prescription set with THRR, safety & activity barriers. Participant demonstrates  ability to understand and report RPE using BORG scale, to self-measure pulse accurately, and to acknowledge the importance of the exercise prescription.  Exercise Prescription Goal: Starting with aerobic activity 30 plus minutes a day, 3 days per week for initial exercise prescription. Provide home exercise prescription and guidelines that participant acknowledges understanding prior to discharge.  Activity Barriers & Risk Stratification:     Activity Barriers & Cardiac Risk Stratification - 04/01/17 1644      Activity Barriers & Cardiac Risk Stratification   Activity Barriers Arthritis;Joint Problems;Muscular Weakness;Balance Concerns   Cardiac Risk Stratification High      6 Minute Walk:     6 Minute Walk    Row Name 04/01/17 1509 07/09/17 1651       6 Minute Walk   Phase Initial Discharge    Distance 1200 feet 1400 feet    Distance % Change  - 17 %    Distance Feet Change  - 200 ft    Walk Time 6 minutes 6 minutes    # of Rest Breaks 0 0    MPH 2.27 2.65    METS 2.22 2.46    RPE 12 16    VO2 Peak 7.76 8.4    Symptoms No Yes (comment)    Comments  - chest tightness 8/10    Resting HR 82 bpm 85 bpm    Resting BP 124/70 132/80    Resting Oxygen Saturation   - 98 %    Exercise Oxygen Saturation  during 6 min walk  - 98 %    Max Ex. HR 110 bpm 116 bpm    Max Ex. BP 152/64 128/58       Oxygen Initial Assessment:   Oxygen Re-Evaluation:   Oxygen Discharge (Final Oxygen Re-Evaluation):   Initial Exercise Prescription:     Initial Exercise Prescription - 04/01/17 1500      Date of Initial Exercise RX and Referring Provider   Date 04/01/17   Referring Provider Josephina Gip MD     Treadmill   MPH 1.7   Grade 0.5   Minutes 15   METs 2.42     Recumbant Elliptical   Level 1   RPM 50   Minutes 15   METs 2     Biostep-RELP   Level 1   SPM 50   Minutes 15   METs 2     Prescription Details   Frequency (times per week) 3  Duration Progress to 45  minutes of aerobic exercise without signs/symptoms of physical distress     Intensity   THRR 40-80% of Max Heartrate 110-138   Ratings of Perceived Exertion 11-13   Perceived Dyspnea 0-4     Progression   Progression Continue to progress workloads to maintain intensity without signs/symptoms of physical distress.     Resistance Training   Training Prescription Yes   Weight 3 lbs   Reps 10-15      Perform Capillary Blood Glucose checks as needed.  Exercise Prescription Changes:     Exercise Prescription Changes    Row Name 04/01/17 1500 04/15/17 1500 05/15/17 0900 05/27/17 1100 05/28/17 1000     Response to Exercise   Blood Pressure (Admit) 124/70 140/64 110/58 104/58  -   Blood Pressure (Exercise) 152/64 156/74 144/60 150/76  -   Blood Pressure (Exit)  - 100/70 120/60 114/62  -   Heart Rate (Admit) 82 bpm 107 bpm 48 bpm 67 bpm  -   Heart Rate (Exercise) 110 bpm 108 bpm 96 bpm 95 bpm  -   Heart Rate (Exit)  - 76 bpm 81 bpm 79 bpm  -   Oxygen Saturation (Admit) 97 %  -  -  -  -   Oxygen Saturation (Exercise) 97 %  -  -  -  -   Rating of Perceived Exertion (Exercise) '12 16 15 15  ' -   Symptoms none  - none none  -   Comments walk test results  -  -  -  -   Duration  - Progress to 45 minutes of aerobic exercise without signs/symptoms of physical distress Progress to 45 minutes of aerobic exercise without signs/symptoms of physical distress Progress to 45 minutes of aerobic exercise without signs/symptoms of physical distress  -   Intensity  - THRR unchanged THRR unchanged THRR unchanged  -     Progression   Progression  -  - Continue to progress workloads to maintain intensity without signs/symptoms of physical distress. Continue to progress workloads to maintain intensity without signs/symptoms of physical distress.  -   Average METs  -  - 1.2 2.2  -     Resistance Training   Training Prescription  - Yes No Yes  -   Weight  - '3 3 3  ' -   Reps  - 10-15 10-15 10-15  -      Interval Training   Interval Training  -  -  - No  -     Treadmill   MPH  - 1.7  - 1.7  -   Grade  - 0.5  - 0.5  -   Minutes  - 15  - 15  -   METs  - 2.42  - 2.42  -     Recumbant Elliptical   Level  -  - 1  -  -   Minutes  -  - 15  -  -   METs  -  - 1.4  -  -     Biostep-RELP   Level  - '1 1 1  ' -   SPM  - 50  -  -  -   Minutes  - '15 15 15  ' -   METs  -  - 1 2  -     Home Exercise Plan   Plans to continue exercise at  -  -  -  - [facility] [employer]   Frequency  -  -  -  -  Add 2 additional days to program exercise sessions.   Initial Home Exercises Provided  -  -  -  - 05/27/17   Row Name 06/09/17 1100 06/26/17 1200 07/09/17 1100         Response to Exercise   Blood Pressure (Admit)  - 148/66 132/62     Blood Pressure (Exercise) 170/80 142/72 126/62     Blood Pressure (Exit) 128/64 108/60 110/54     Heart Rate (Admit) 54 bpm 73 bpm 90 bpm     Heart Rate (Exercise) 102 bpm 102 bpm 116 bpm     Heart Rate (Exit) 73 bpm 82 bpm 84 bpm     Rating of Perceived Exertion (Exercise) '14 16 15     ' Symptoms none none none     Duration Progress to 45 minutes of aerobic exercise without signs/symptoms of physical distress Progress to 45 minutes of aerobic exercise without signs/symptoms of physical distress Progress to 45 minutes of aerobic exercise without signs/symptoms of physical distress     Intensity THRR unchanged THRR unchanged THRR unchanged       Progression   Progression Continue to progress workloads to maintain intensity without signs/symptoms of physical distress. Continue to progress workloads to maintain intensity without signs/symptoms of physical distress. Continue to progress workloads to maintain intensity without signs/symptoms of physical distress.     Average METs 2.21 2 2.4       Resistance Training   Training Prescription Yes Yes Yes     Weight '3 3 3     ' Reps 10-15 10-15 10-15       Interval Training   Interval Training No No No       Treadmill   MPH 1.7 1.7 2      Grade 0.5 0.5 1     Minutes '15 15 15     ' METs 2.42 2.54 2.81       Recumbant Elliptical   Level  - 1  -     Minutes  - 15  -     METs  - 1.6  -       Biostep-RELP   Level 1  -  -     Minutes 15  -  -     METs 2  -  -       Home Exercise Plan   Plans to continue exercise at [place] 2 additional days to program exercise sessions.  -  -     Initial Home Exercises Provided 05/27/17  -  -        Exercise Comments:     Exercise Comments    Row Name 04/06/17 1754 04/09/17 1719 04/15/17 1531       Exercise Comments First full day of exercise!  Lawrence [name] was oriented to gym and equipment including functions, settings, policies, and procedures.  Lawrence [name] individual exercise prescription and treatment plan were reviewed.  All starting workloads were established based on the results of the 6 minute walk test done at initial orientation visit.  The plan for exercise progression was also introduced and progression will be customized based on patient's performance and goals. [place] had chest pain during exercise.  It resolved with rest and he completed the session with no other issues. Lawrence [name] tolerated exercise well last session.  Staff will continue to monitor progress.        Exercise Goals and Review:     Exercise Goals  [employer] Name 04/01/17 1513             Exercise Goals   Increase Physical Activity Yes       Intervention Provide advice, education, support and counseling about physical activity/exercise needs.;Develop an individualized exercise prescription for aerobic and resistive training based on initial evaluation findings, risk stratification, comorbidities and participant's personal goals.       Expected Outcomes Achievement of increased cardiorespiratory fitness and enhanced flexibility, muscular endurance and strength shown through measurements of functional capacity and personal statement of participant.       Increase Strength and Stamina Yes        Intervention Provide advice, education, support and counseling about physical activity/exercise needs.;Develop an individualized exercise prescription for aerobic and resistive training based on initial evaluation findings, risk stratification, comorbidities and participant's personal goals.       Expected Outcomes Achievement of increased cardiorespiratory fitness and enhanced flexibility, muscular endurance and strength shown through measurements of functional capacity and personal statement of participant.          Exercise Goals Re-Evaluation :     Exercise Goals Re-Evaluation    Row Name 04/29/17 1354 05/15/17 0935 05/27/17 1134 05/28/17 1034 05/29/17 0922     Exercise Goal Re-Evaluation   Exercise Goals Review  - Increase Physical Activity;Increase Strenth and Stamina Increase Physical Activity;Increase Strenth and Stamina Increase Physical Activity Increase Physical Activity   Comments Out since last review. Lawrence [name] is back after missing sessions.  He has tolerated exercise well and is encouraged by his doctor to exercise. Lawrence [name] is reaching RPE goals for exercise.  Regular attendance will produce better outcomes for strength and stamina. Home exercise was reviewed with [place] [name] 7/25.  HR RPE and safety were reviewed.  Pt voiced understanding. Lawrence [name] plans to exercise three days per week and walk at the [place] if he cant get to class on time.   Expected Outcomes  - [place] will be able to attend class on a regular basis.  [place] will build strength and stamina. [place] will  continue to attend 3 days per week.  [place] will complete the program and graduate from HT. [place] will be more active outside of classes.  [place] will continue exercise in the [employer]. [place] will exercise 3 days per week in HT.  [place] will also exercise on his own outside of class.   [place] Name 06/09/17 1127 06/26/17 1211 07/09/17 1109 07/09/17 1700       Exercise Goal  Re-Evaluation   Exercise Goals Review Increase Physical Activity;Increase Strenth and Stamina Increase Physical Activity;Increase Strenth and Stamina Increase Physical Activity;Increase Strength and [place] has been doing well in rehab.  He has been walking some at home.  His blood pressures continue to run a little high during exercise.  We will continue to monitor his progression. Lawrence [name] has been attending class regularly and is paying attention to how he feels.  He started too fast and "felt like wild horses ran over me" so he slowed down and staff reviewed the importance of warm up. Lawrence [name] stated his BG is more controlled since he has been exercising. Increased by 200 ft post 6 min walk. 6MWT reviewed with patient.    Expected Outcomes Short: Increase workload on [employer]. Long: Exercise more at home and come to class regularly. [place] will continue to attend regularly.  [place] will develop long term exercise habit and body awareness. [place] will graduate HT [place] will maintain exercise on his own.  -       Discharge Exercise Prescription (Final Exercise Prescription Changes):     Exercise Prescription Changes - 07/09/17 1100      Response to Exercise   Blood Pressure (Admit) 132/62   Blood Pressure (Exercise) 126/62   Blood Pressure (Exit) 110/54   Heart Rate (Admit) 90 bpm   Heart Rate (Exercise) 116 bpm   Heart Rate (Exit) 84 bpm   Rating of Perceived Exertion (Exercise) 15   Symptoms none   Duration Progress to 45 minutes of aerobic exercise without signs/symptoms of physical distress   Intensity THRR unchanged     Progression   Progression Continue to progress workloads to maintain intensity without signs/symptoms of physical distress.   Average METs 2.4     Resistance Training   Training Prescription Yes   Weight 3   Reps 10-15     Interval Training   Interval Training No     Treadmill   MPH 2   Grade 1   Minutes 15   METs  2.81      Nutrition:  Target Goals: Understanding of nutrition guidelines, daily intake of sodium <1515m, cholesterol <2061m calories 30% from fat and 7% or less from saturated fats, daily to have 5 or more servings of fruits and vegetables.  Biometrics:     Pre Biometrics - 04/01/17 1513      Pre Biometrics   Height 5' 10.8" (1.798 m)   Weight (!)  300 lb 1.6 oz (136.1 kg)   Waist Circumference 50.25 inches   Hip Circumference 54.5 inches   Waist to Hip Ratio 0.92 %   BMI (Calculated) 42.2   Single Leg Stand 1.43 seconds         Post Biometrics - 07/09/17 1654       Post  Biometrics   Height 5' 10.8" (1.798 m)   Weight 299 lb 4.8 oz (135.8 kg)   Waist Circumference 50.25 inches   Hip Circumference 54.5 inches   Waist to Hip Ratio 0.92 %   BMI (Calculated) 42   Single Leg Stand 4.09 seconds      Nutrition Therapy Plan and Nutrition Goals:     Nutrition Therapy & Goals - 04/01/17 1644      Nutrition Therapy   RD appointment defered Yes   Drug/Food Interactions Statins/Certain Fruits      Nutrition Discharge: Rate Your Plate Scores:     Nutrition Assessments - 07/15/17 1727      MEDFICTS Scores   Post Score 24      Nutrition Goals Re-Evaluation:   Nutrition Goals Discharge (Final Nutrition Goals Re-Evaluation):   Psychosocial: Target Goals: Acknowledge presence or absence of significant depression and/or stress, maximize coping skills, provide positive support system. Participant is able to verbalize types and ability to use techniques and skills needed for reducing stress and depression.   Initial Review & Psychosocial Screening:     Initial Psych Review & Screening - 04/01/17 1646      Initial Review   Current issues with Current Sleep Concerns     Family Dynamics   Good Support System? Yes     Barriers   Psychosocial barriers to participate in program The patient should benefit from training in stress management and relaxation.      Screening Interventions   Interventions Encouraged to  exercise      Quality of Life Scores:      Quality of Life - 07/15/17 1724      Quality of Life Scores   Health/Function Post 20.71 %   Socioeconomic Post 20.43 %   Psych/Spiritual Post 21 %   Family Post 20.75 %   GLOBAL Post 20.72 %      PHQ-9: Recent Review Flowsheet Data    Depression screen [facility] 2/9 07/15/2017 04/01/2017 12/12/2016 09/11/2016 09/05/2016   Decreased Interest '1 1 1 ' 0 0   Down, Depressed, Hopeless '1 1 1 1 ' 0   PHQ - 2 Score '2 2 2 1 ' 0   Altered sleeping '1 1 3 1 ' -   Tired, decreased energy '1 1 1 1 ' -   Change in appetite 0 2 1 0 -   Feeling bad or failure about yourself  '1 2 1 1 ' -   Trouble concentrating '1 1 1 1 ' -   Moving slowly or fidgety/restless 0 '1 1 1 ' -   Suicidal thoughts 0 0 0 0 -   PHQ-9 Score '6 10 10 6 ' -   Difficult doing work/chores Not difficult at all Somewhat difficult Somewhat difficult Somewhat difficult -     Interpretation of Total Score  Total Score Depression Severity:  1-4 = Minimal depression, 5-9 = Mild depression, 10-14 = Moderate depression, 15-19 = Moderately severe depression, 20-27 = Severe depression   Psychosocial Evaluation and Intervention:     Psychosocial Evaluation - 07/15/17 1711      Discharge Psychosocial Assessment & Intervention   Comments Counselor met briefly with Lawrence [name] today as he graduates from this program for the 2nd time!  He reports having increased his stamina and strength while here and has enjoyed the program and all the other patients/socialization.  He plans to return to the [employer] class with his spouse in the near future.  Counselor commended [place] for his progress made while here.        Psychosocial Re-Evaluation:     Psychosocial Re-Evaluation    [place] Name 05/20/17 1723 06/26/17 1216 07/01/17 1739 07/08/17 1701       Psychosocial Re-Evaluation   Current issues with  - Current Stress Concerns  -  -    Comments Counselor follow up with  Lawrence [name] today reporting his Dr. put him back on a former medication recently which has helped reduce the tightness in his chest and also decrease his pain levels overall.  Lawrence [name] states he feels better overall and enjoys coming to each session.  He has also benefitted from exercise and reports a decreased need for insulin recently - which is a positive as well.  Lawrence [name] continues to struggle with coping with the tasks he can no longer do as a result of his health issues.  He also continues to be concerned about his spouse's health issues; but admits he is coping better overall.  And he looks forward to completion of this program so he and his spouse can return to fit for Life classes together.  Counselor and staff will continue to follow with [place].   Lawrence [name] wife is in PT for balance issues.  He is ready for them to come to [employer] together. Counselor follow up with [place] [name] today reporting he is scheduled for an MRI tomorrow morning due to his bouts with dizziness.  Lawrence [name] was in a positive mood and even laughed about this with counselor.   Counselor follow up with [facility] reporting the MRI  did not detect anything of concern and attributed the dizziness to "aging."  Lawrence [name] will be attending the [place] at the ENT office locally to help manage these symptoms better.  He begins that next week.  He continues to have a positive attitude and work hard in this program.      Expected Outcomes  - [place] will continue in [place] and his wife will be able to attend FF together. Lawrence [name] will have an MRI to determine if there is something to help decrease his dizziness.  Staff will follow.  -    Interventions  - Encouraged to attend Cardiac Rehabilitation for the exercise  -  -       Psychosocial Discharge (Final Psychosocial Re-Evaluation):     Psychosocial Re-Evaluation - 07/08/17 1701      Psychosocial Re-Evaluation   Comments Counselor follow up with Lawrence [name] reporting the MRI did not detect anything  of concern and attributed the dizziness to "aging."  Lawrence [name] will be attending the [place] at the ENT office locally to help manage these symptoms better.  He begins that next week.  He continues to have a positive attitude and work hard in this program.        Vocational Rehabilitation: Provide vocational rehab assistance to qualifying candidates.   Vocational Rehab Evaluation & Intervention:     Vocational Rehab - 04/01/17 1635      Initial Vocational Rehab Evaluation & Intervention   Assessment shows need for Vocational Rehabilitation No      Education: Education Goals: Education classes will be provided on a variety of topics geared toward better understanding of heart health and risk factor modification. Participant will state understanding/return demonstration of topics presented as noted by education test scores.  Learning Barriers/Preferences:     Learning Barriers/Preferences - 04/01/17 1634      Learning Barriers/Preferences   Learning Barriers Sight   Learning Preferences None      Education Topics: General Nutrition Guidelines/Fats and Fiber: -Group instruction provided by verbal, written material, models and posters to present the general guidelines for heart healthy nutrition. Gives an explanation and review of dietary fats and fiber.   Controlling Sodium/Reading Food Labels: -Group verbal and written material supporting the discussion of sodium use in heart healthy nutrition. Review and explanation with models, verbal and written materials for utilization of the food label.   Cardiac Rehab from 12/15/2016 in [facility] Cardiac [facility] Pulmonary Rehab  Date  09/22/16  Educator  PI  Instruction Review Code (retired)  2- meets goals/outcomes      Exercise Physiology & Risk Factors: - Group verbal and written instruction with models to review the exercise physiology of the cardiovascular system and associated critical values. Details cardiovascular disease risk  factors and the goals associated with each risk factor.   Cardiac Rehab from 07/15/2017 in [facility] Cardiac and Pulmonary Rehab  Date  07/08/17  Educator  Nada Maclachlan, EP  Instruction Review Code  2- Demonstrated Understanding      Aerobic Exercise & Resistance Training: - Gives group verbal and written discussion on the health impact of inactivity. On the components of aerobic and resistive training programs and the benefits of this training and how to safely progress through these programs.   Cardiac Rehab from 07/15/2017 in [facility] Cardiac and Pulmonary Rehab  Date  07/13/17  Educator  [facility]  Instruction Review Code  1- Geologist, engineering, Balance, General Exercise Guidelines: -  Provides group verbal and written instruction on the benefits of flexibility and balance training programs. Provides general exercise guidelines with specific guidelines to those with heart or lung disease. Demonstration and skill practice provided.   Cardiac Rehab from 07/15/2017 in [facility] Cardiac and Pulmonary Rehab  Date  07/15/17  Educator  AS  Instruction Review Code  1- Verbalizes Understanding      Stress Management: - Provides group verbal and written instruction about the health risks of elevated stress, cause of high stress, and healthy ways to reduce stress.   Cardiac Rehab from 07/15/2017 in [facility] Cardiac and Pulmonary Rehab  Date  05/27/17  Educator  [place]      Depression: - Provides group verbal and written instruction on the correlation between heart/lung disease and depressed mood, treatment options, and the stigmas associated with seeking treatment.   Cardiac Rehab from 07/15/2017 in [facility] Cardiac and Pulmonary Rehab  Date  06/24/17  Educator  [name]  Instruction Review Code (retired)  2- meets Designer, fashion/clothing & Physiology of the Heart: - Group verbal and written instruction and models provide basic cardiac anatomy and physiology, with the coronary electrical and  arterial systems. Review of: AMI, Angina, Valve disease, Heart Failure, Cardiac Arrhythmia, Pacemakers, and the ICD.   Cardiac Rehab from 12/15/2016 in [facility] Cardiac and Pulmonary Rehab  Date  12/08/16  Educator  C. [place]  Instruction Review Code (retired)  2- meets goals/outcomes      Cardiac Procedures: - Group verbal and written instruction to review commonly prescribed medications for heart disease. Reviews the medication, class of the drug, and side effects. Includes the steps to properly store meds and maintain the prescription regimen. (beta blockers and nitrates)   Cardiac Rehab from 07/15/2017 in [facility] Cardiac and Pulmonary Rehab  Date  04/06/17  Educator  SB      Cardiac Medications I: - Group verbal and written instruction to review commonly prescribed medications for heart disease. Reviews the medication, class of the drug, and side effects. Includes the steps to properly store meds and maintain the prescription regimen.   Cardiac Rehab from 07/15/2017 in [facility] Cardiac and Pulmonary Rehab  Date  06/08/17 [Part 2 06/10/17]  Educator  MJA, RN [Part 2 SB]      Cardiac Medications II: -Group verbal and written instruction to review commonly prescribed medications for heart disease. Reviews the medication, class of the drug, and side effects. (all other drug classes)    Go Sex-Intimacy & Heart Disease, Get SMART - Goal Setting: - Group verbal and written instruction through game format to discuss heart disease and the return to sexual intimacy. Provides group verbal and written material to discuss and apply goal setting through the application of the S.M.A.R.T. Method.   Cardiac Rehab from 07/15/2017 in [facility] Cardiac and Pulmonary Rehab  Date  04/06/17  Educator  SB      Other Matters of the Heart: - Provides group verbal, written materials and models to describe Heart Failure, Angina, Valve Disease, Peripheral Artery Disease, and Diabetes in the realm of heart disease.  Includes description of the disease process and treatment options available to the cardiac patient.   Cardiac Rehab from 12/15/2016 in [facility] Cardiac and Pulmonary Rehab  Date  12/08/16  Educator  C. [place]  Instruction Review Code (retired)  2- meets goals/outcomes      Exercise & Equipment Safety: - Individual verbal instruction and demonstration of equipment use and safety with use of the equipment.  Cardiac Rehab from 07/15/2017 in [facility] Cardiac and Pulmonary Rehab  Date  04/01/17  Educator  C. EnterkinRN      Infection Prevention: - Provides verbal and written material to individual with discussion of infection control including proper hand washing and proper equipment cleaning during exercise session.   Cardiac Rehab from 07/15/2017 in [name] Cardiac and Pulmonary Rehab  Date  04/01/17  Educator  C. EnterkinRN      Falls Prevention: - Provides verbal and written material to individual with discussion of falls prevention and safety.   Cardiac Rehab from 07/15/2017 in [facility] Cardiac and Pulmonary Rehab  Date  04/01/17  Educator  C. [place]  Instruction Review Code (retired)  2- meets goals/outcomes      Diabetes: - Individual verbal and written instruction to review signs/symptoms of diabetes, desired ranges of glucose level fasting, after meals and with exercise. Acknowledge that pre and post exercise glucose checks will be done for 3 sessions at entry of program.   Cardiac Rehab from 07/15/2017 in [facility] Cardiac and Pulmonary Rehab  Date  04/01/17  Educator  C. EnterkinRN      Other: -Provides group and verbal instruction on various topics (see comments)    Knowledge Questionnaire Score:     Knowledge Questionnaire Score - 07/15/17 1720      Knowledge Questionnaire Score   Post Score 23/28      Core Components/Risk Factors/Patient Goals at Admission:     Personal Goals and Risk Factors at Admission - 04/01/17 1645      Core Components/Risk Factors/Patient  Goals on Admission   Admit Weight 300 lb (136.1 kg)   Goal Weight: Short Term 295 lb (133.8 kg)   Goal Weight: Long Term 220 lb (99.8 kg)   Expected Outcomes Short Term: Continue to assess and modify interventions until short term weight is achieved;Weight Loss: Understanding of general recommendations for a balanced deficit meal plan, which promotes 1-2 lb weight loss per week and includes a negative energy balance of [phone] kcal/d   Diabetes Yes   Intervention Provide education about signs/symptoms and action to take for hypo/hyperglycemia.;Provide education about proper nutrition, including hydration, and aerobic/resistive exercise prescription along with prescribed medications to achieve blood glucose in normal ranges: Fasting glucose 65-99 mg/dL   Expected Outcomes Short Term: Participant verbalizes understanding of the signs/symptoms and immediate care of hyper/hypoglycemia, proper foot care and importance of medication, aerobic/resistive exercise and nutrition plan for blood glucose control.;Long Term: Attainment of HbA1C < 7%.   Hypertension Yes   Intervention Provide education on lifestyle modifcations including regular physical activity/exercise, weight management, moderate sodium restriction and increased consumption of fresh fruit, vegetables, and low fat dairy, alcohol moderation, and smoking cessation.;Monitor prescription use compliance.   Expected Outcomes Short Term: Continued assessment and intervention until BP is < 140/14m HG in hypertensive participants. < 130/83mHG in hypertensive participants with diabetes, heart failure or chronic kidney disease.;Long Term: Maintenance of blood pressure at goal levels.   Lipids Yes   Intervention Provide education and support for participant on nutrition & aerobic/resistive exercise along with prescribed medications to achieve LDL <7074mHDL >80m47m Expected Outcomes Short Term: Participant states understanding of desired cholesterol values  and is compliant with medications prescribed. Participant is following exercise prescription and nutrition guidelines.;Long Term: Cholesterol controlled with medications as prescribed, with individualized exercise RX and with personalized nutrition plan. Value goals: LDL < 70mg62mL > 40 mg.      Core Components/Risk Factors/Patient Goals Review:  Goals and Risk Factor Review    Row Name 05/29/17 9381 06/26/17 1213 07/09/17 1111         Core Components/Risk Factors/Patient Goals Review   Personal Goals Review Weight Management/Obesity;Diabetes Weight Management/Obesity;Diabetes Weight Management/Obesity;Diabetes     Review Lawrence [name] has lost about 2-3 lb.  His wife took all the candy out of the house.  His BG is still out of whack at times but it is staying under 250.  His goal is to attend three days per week exercise on a regular basis.   Lawrence [name] states his weight still fluctuates due to fluid.  He is still not eating candy.  His BG has been more steady and he is working with his Dr.  to decrease insulin. Way[place] weight was down today to 297.5.  He staes he has reduced his insulin dosage due to exercise.     Expected Outcomes [place] will continue to monitor his BG and weight.  [place] will have stable BG and lose weight. [place] will continue healthier dietary habits.  [place] will control his BG better overall. [place] will finish HT [place] will maintain exercise on his own.        Core Components/Risk Factors/Patient Goals at Discharge (Final Review):      Goals and Risk Factor Review - 07/09/17 1111      Core Components/Risk Factors/Patient Goals Review   Personal Goals Review Weight Management/Obesity;Diabetes   Review [place] weight was down today to 297.5.  He staes he has reduced his insulin dosage due to exercise.   Expected Outcomes Short - Lawrence [name] will finish HT [place] will maintain exercise on his own.      ITP Comments:     ITP  Comments    Row Name 04/01/17 1643 04/09/17 1720 04/14/17 1209 04/22/17 0556 04/29/17 1353   ITP Comments  Medical review started after informed consent was signed for Cardiac REhab then ITP was created. Diagnosis in CHL/EPIC. [place] had chest pain during exercise.  It resolved with rest and he completed the session with no other issues. [place] continues to have angina symptoms with exercise and at home at rest.  Reviewing his meds with him, we discovered he ahd been on Renexa and has been off since one of his hospitalizations.  I advised Lawrence [name] to call his physician and ask about resumong the Renexa for possible symptom control.  30 day review. Continue with ITP unless directed changes per Medical Director review Called to check on status of pt.  He was admitted for chest pain. All labs were negative and they cancelled his cath until after his follow up as outpatient.  He has a follow up appointment on July 9th.  He will need clearance to return to rehab.   [place] Name 05/20/17 0655 05/28/17 1036 06/17/17 0628 06/17/17 1631 06/25/17 1702   ITP Comments 30 day review. Continue with ITP unless directed changes per Medical Director review     Med changes have helped with symptom control per [facility] exercise was reviewed with [place] [name] 7/25.  HR RPE and safety were reviewed.  Pt voiced understanding. 30 day review. Continue with ITP unless directed changes per Medical Director review  [name] did not complete his rehab session.  Mr. [place] blood sugar was checked before exercise and was 86. He was given a snack and sent home. Mr Gras is to resume Thursday. Mr. Valdes felt dizzy during exercise.  Exercise stopped after he felt dizzy, resumed after drinking water and dizziness subsided.  His blood pressure was 102/60 and his blood sugar was 151.    [place] Name 07/09/17 1648 07/15/17 1126         ITP Comments [place] did not feel good after his 6 minute walk. Blood sugar checked at 224. He continued to exercise  without incident. 30 day review. Continue with ITP unless directed changes per Medical Director review.           Comments: Discharge ITP

## 2017-07-15 NOTE — Progress Notes (Signed)
Discharge Progress Report  Patient Details  Name: Lawrence [name] MRN: [id number] Date of Birth: [birth year] Referring Provider:     Cardiac Rehab from 04/01/2017 in [facility] Cardiac and Pulmonary Rehab  Referring Provider  Josephina Gip MD       Number of Visits: 36  Reason for Discharge:  Patient reached a stable level of exercise. Patient independent in their exercise.  Smoking History:  History  Smoking Status  . Former Smoker  . Packs/day: 1.00  . Years: 16.00  . Types: Cigarettes  . Start date: 11/03/1969  . Quit date: 07/22/1986  Smokeless Tobacco  . Never Used    Diagnosis:  NSTEMI (non-ST elevated myocardial infarction) ([place])  ADL UCSD:   Initial Exercise Prescription:     Initial Exercise Prescription - 04/01/17 1500      Date of Initial Exercise RX and Referring Provider   Date 04/01/17   Referring Provider Josephina Gip MD     Treadmill   MPH 1.7   Grade 0.5   Minutes 15   METs 2.42     Recumbant Elliptical   Level 1   RPM 50   Minutes 15   METs 2     Biostep-RELP   Level 1   SPM 50   Minutes 15   METs 2     Prescription Details   Frequency (times per week) 3   Duration Progress to 45 minutes of aerobic exercise without signs/symptoms of physical distress     Intensity   THRR 40-80% of Max Heartrate 110-138   Ratings of Perceived Exertion 11-13   Perceived Dyspnea 0-4     Progression   Progression Continue to progress workloads to maintain intensity without signs/symptoms of physical distress.     Resistance Training   Training Prescription Yes   Weight 3 lbs   Reps 10-15      Discharge Exercise Prescription (Final Exercise Prescription Changes):     Exercise Prescription Changes - 07/09/17 1100      Response to Exercise   Blood Pressure (Admit) 132/62   Blood Pressure (Exercise) 126/62   Blood Pressure (Exit) 110/54   Heart Rate (Admit) 90 bpm   Heart Rate (Exercise) 116 bpm   Heart Rate (Exit) 84 bpm   Rating of  Perceived Exertion (Exercise) 15   Symptoms none   Duration Progress to 45 minutes of aerobic exercise without signs/symptoms of physical distress   Intensity THRR unchanged     Progression   Progression Continue to progress workloads to maintain intensity without signs/symptoms of physical distress.   Average METs 2.4     Resistance Training   Training Prescription Yes   Weight 3   Reps 10-15     Interval Training   Interval Training No     Treadmill   MPH 2   Grade 1   Minutes 15   METs 2.81      Functional Capacity:     6 Minute Walk    Row Name 04/01/17 1509 07/09/17 1651       6 Minute Walk   Phase Initial Discharge    Distance 1200 feet 1400 feet    Distance % Change  - 17 %    Distance Feet Change  - 200 ft    Walk Time 6 minutes 6 minutes    # of Rest Breaks 0 0    MPH 2.27 2.65    METS 2.22 2.46    RPE 12 16  VO2 Peak 7.76 8.4    Symptoms No Yes (comment)    Comments  - chest tightness 8/10    Resting HR 82 bpm 85 bpm    Resting BP 124/70 132/80    Resting Oxygen Saturation   - 98 %    Exercise Oxygen Saturation  during 6 min walk  - 98 %    Max Ex. HR 110 bpm 116 bpm    Max Ex. BP 152/64 128/58       Psychological, QOL, Others - Outcomes: PHQ 2/9: Depression screen [facility] 2/9 07/15/2017 04/01/2017 12/12/2016 09/11/2016 09/05/2016  Decreased Interest 1 1 1  0 0  Down, Depressed, Hopeless 1 1 1 1  0  PHQ - 2 Score 2 2 2 1  0  Altered sleeping 1 1 3 1  -  Tired, decreased energy 1 1 1 1  -  Change in appetite 0 2 1 0 -  Feeling bad or failure about yourself  1 2 1 1  -  Trouble concentrating 1 1 1 1  -  Moving slowly or fidgety/restless 0 1 1 1  -  Suicidal thoughts 0 0 0 0 -  PHQ-9 Score 6 10 10 6  -  Difficult doing work/chores Not difficult at all Somewhat difficult Somewhat difficult Somewhat difficult -    Quality of Life:     Quality of Life - 07/15/17 1724      Quality of Life Scores   Health/Function Post 20.71 %   Socioeconomic Post 20.43  %   Psych/Spiritual Post 21 %   Family Post 20.75 %   GLOBAL Post 20.72 %      Personal Goals: Goals established at orientation with interventions provided to work toward goal.     Personal Goals and Risk Factors at Admission - 04/01/17 1645      Core Components/Risk Factors/Patient Goals on Admission   Admit Weight 300 lb (136.1 kg)   Goal Weight: Short Term 295 lb (133.8 kg)   Goal Weight: Long Term 220 lb (99.8 kg)   Expected Outcomes Short Term: Continue to assess and modify interventions until short term weight is achieved;Weight Loss: Understanding of general recommendations for a balanced deficit meal plan, which promotes 1-2 lb weight loss per week and includes a negative energy balance of [phone] kcal/d   Diabetes Yes   Intervention Provide education about signs/symptoms and action to take for hypo/hyperglycemia.;Provide education about proper nutrition, including hydration, and aerobic/resistive exercise prescription along with prescribed medications to achieve blood glucose in normal ranges: Fasting glucose 65-99 mg/dL   Expected Outcomes Short Term: Participant verbalizes understanding of the signs/symptoms and immediate care of hyper/hypoglycemia, proper foot care and importance of medication, aerobic/resistive exercise and nutrition plan for blood glucose control.;Long Term: Attainment of HbA1C < 7%.   Hypertension Yes   Intervention Provide education on lifestyle modifcations including regular physical activity/exercise, weight management, moderate sodium restriction and increased consumption of fresh fruit, vegetables, and low fat dairy, alcohol moderation, and smoking cessation.;Monitor prescription use compliance.   Expected Outcomes Short Term: Continued assessment and intervention until BP is < 140/68mm HG in hypertensive participants. < 130/63mm HG in hypertensive participants with diabetes, heart failure or chronic kidney disease.;Long Term: Maintenance of blood pressure  at goal levels.   Lipids Yes   Intervention Provide education and support for participant on nutrition & aerobic/resistive exercise along with prescribed medications to achieve LDL 70mg , HDL >40mg .   Expected Outcomes Short Term: Participant states understanding of desired cholesterol values and is compliant with medications prescribed.  Participant is following exercise prescription and nutrition guidelines.;Long Term: Cholesterol controlled with medications as prescribed, with individualized exercise RX and with personalized nutrition plan. Value goals: LDL < 70mg , HDL > 40 mg.       Personal Goals Discharge:     Goals and Risk Factor Review    Row Name 05/29/17 2725 06/26/17 1213 07/09/17 1111         Core Components/Risk Factors/Patient Goals Review   Personal Goals Review Weight Management/Obesity;Diabetes Weight Management/Obesity;Diabetes Weight Management/Obesity;Diabetes     Review [name] has lost about 2-3 lb.  His wife took all the candy out of the house.  His BG is still out of whack at times but it is staying under 250.  His goal is to attend three days per week exercise on a regular basis.   [name] states his weight still fluctuates due to fluid.  He is still not eating candy.  His BG has been more steady and he is working with his Dr.  to decrease insulin. Way[place] weight was down today to 297.5.  He staes he has reduced his insulin dosage due to exercise.     Expected Outcomes [place] will continue to monitor his BG and weight.  [place] will have stable BG and lose weight. [place] will continue healthier dietary habits.  [place] will control his BG better overall. [place] will finish HT [place] will maintain exercise on his own.        Exercise Goals and Review:     Exercise Goals    Row Name 04/01/17 1513             Exercise Goals   Increase Physical Activity Yes       Intervention Provide advice, education, support and counseling about  physical activity/exercise needs.;Develop an individualized exercise prescription for aerobic and resistive training based on initial evaluation findings, risk stratification, comorbidities and participant's personal goals.       Expected Outcomes Achievement of increased cardiorespiratory fitness and enhanced flexibility, muscular endurance and strength shown through measurements of functional capacity and personal statement of participant.       Increase Strength and Stamina Yes       Intervention Provide advice, education, support and counseling about physical activity/exercise needs.;Develop an individualized exercise prescription for aerobic and resistive training based on initial evaluation findings, risk stratification, comorbidities and participant's personal goals.       Expected Outcomes Achievement of increased cardiorespiratory fitness and enhanced flexibility, muscular endurance and strength shown through measurements of functional capacity and personal statement of participant.          Nutrition & Weight - Outcomes:     Pre Biometrics - 04/01/17 1513      Pre Biometrics   Height 5' 10.8" (1.798 m)   Weight (!)  300 lb 1.6 oz (136.1 kg)   Waist Circumference 50.25 inches   Hip Circumference 54.5 inches   Waist to Hip Ratio 0.92 %   BMI (Calculated) 42.2   Single Leg Stand 1.43 seconds         Post Biometrics - 07/09/17 1654       Post  Biometrics   Height 5' 10.8" (1.798 m)   Weight 299 lb 4.8 oz (135.8 kg)   Waist Circumference 50.25 inches   Hip Circumference 54.5 inches   Waist to Hip Ratio 0.92 %   BMI (Calculated) 42   Single Leg Stand 4.09 seconds  Nutrition:     Nutrition Therapy & Goals - 04/01/17 1644      Nutrition Therapy   RD appointment defered Yes   Drug/Food Interactions Statins/Certain Fruits      Nutrition Discharge:     Nutrition Assessments - 07/15/17 1727      MEDFICTS Scores   Post Score 24      Education Questionnaire  Score:     Knowledge Questionnaire Score - 07/15/17 1720      Knowledge Questionnaire Score   Post Score 23/28      Goals reviewed with patient; copy given to patient.

## 2017-07-15 NOTE — Patient Instructions (Signed)
Discharge Instructions  Patient Details  Name: Lawrence [name] MRN: [id number] Date of Birth: 03-17-1951 Referring Provider:  Wellington Hampshire, MD   Number of Visits: 60  Reason for Discharge:  Patient independent in their exercise. Patient has met program and personal goals.  Smoking History:  History  Smoking Status  . Former Smoker  . Packs/day: 1.00  . Years: 16.00  . Types: Cigarettes  . Start date: 11/03/1969  . Quit date: 07/22/1986  Smokeless Tobacco  . Never Used    Diagnosis:  NSTEMI (non-ST elevated myocardial infarction) ([place])  S/P CABG x 4  Initial Exercise Prescription:     Initial Exercise Prescription - 04/01/17 1500      Date of Initial Exercise RX and Referring Provider   Date 04/01/17   Referring Provider Josephina Gip MD     Treadmill   MPH 1.7   Grade 0.5   Minutes 15   METs 2.42     Recumbant Elliptical   Level 1   RPM 50   Minutes 15   METs 2     Biostep-RELP   Level 1   SPM 50   Minutes 15   METs 2     Prescription Details   Frequency (times per week) 3   Duration Progress to 45 minutes of aerobic exercise without signs/symptoms of physical distress     Intensity   THRR 40-80% of Max Heartrate 110-138   Ratings of Perceived Exertion 11-13   Perceived Dyspnea 0-4     Progression   Progression Continue to progress workloads to maintain intensity without signs/symptoms of physical distress.     Resistance Training   Training Prescription Yes   Weight 3 lbs   Reps 10-15      Discharge Exercise Prescription (Final Exercise Prescription Changes):     Exercise Prescription Changes - 07/09/17 1100      Response to Exercise   Blood Pressure (Admit) 132/62   Blood Pressure (Exercise) 126/62   Blood Pressure (Exit) 110/54   Heart Rate (Admit) 90 bpm   Heart Rate (Exercise) 116 bpm   Heart Rate (Exit) 84 bpm   Rating of Perceived Exertion (Exercise) 15   Symptoms none   Duration Progress to 45 minutes of aerobic  exercise without signs/symptoms of physical distress   Intensity THRR unchanged     Progression   Progression Continue to progress workloads to maintain intensity without signs/symptoms of physical distress.   Average METs 2.4     Resistance Training   Training Prescription Yes   Weight 3   Reps 10-15     Interval Training   Interval Training No     Treadmill   MPH 2   Grade 1   Minutes 15   METs 2.81      Functional Capacity:     6 Minute Walk    Row Name 04/01/17 1509 07/09/17 1651       6 Minute Walk   Phase Initial Discharge    Distance 1200 feet 1400 feet    Distance % Change  - 17 %    Distance Feet Change  - 200 ft    Walk Time 6 minutes 6 minutes    # of Rest Breaks 0 0    MPH 2.27 2.65    METS 2.22 2.46    RPE 12 16    VO2 Peak 7.76 8.4    Symptoms No Yes (comment)    Comments  - chest tightness 8/10  Resting HR 82 bpm 85 bpm    Resting BP 124/70 132/80    Resting Oxygen Saturation   - 98 %    Exercise Oxygen Saturation  during 6 min walk  - 98 %    Max Ex. HR 110 bpm 116 bpm    Max Ex. BP 152/64 128/58       Quality of Life:     Quality of Life - 04/01/17 1629      Quality of Life Scores   Health/Function Pre 11.2 %   Socioeconomic Pre 22.75 %   Psych/Spiritual Pre 19.5 %   Family Pre 25.5 %   GLOBAL Pre 16.97 %      Personal Goals: Goals established at orientation with interventions provided to work toward goal.     Personal Goals and Risk Factors at Admission - 04/01/17 1645      Core Components/Risk Factors/Patient Goals on Admission   Admit Weight 300 lb (136.1 kg)   Goal Weight: Short Term 295 lb (133.8 kg)   Goal Weight: Long Term 220 lb (99.8 kg)   Expected Outcomes Short Term: Continue to assess and modify interventions until short term weight is achieved;Weight Loss: Understanding of general recommendations for a balanced deficit meal plan, which promotes 1-2 lb weight loss per week and includes a negative energy balance  of [phone] kcal/d   Diabetes Yes   Intervention Provide education about signs/symptoms and action to take for hypo/hyperglycemia.;Provide education about proper nutrition, including hydration, and aerobic/resistive exercise prescription along with prescribed medications to achieve blood glucose in normal ranges: Fasting glucose 65-99 mg/dL   Expected Outcomes Short Term: Participant verbalizes understanding of the signs/symptoms and immediate care of hyper/hypoglycemia, proper foot care and importance of medication, aerobic/resistive exercise and nutrition plan for blood glucose control.;Long Term: Attainment of HbA1C < 7%.   Hypertension Yes   Intervention Provide education on lifestyle modifcations including regular physical activity/exercise, weight management, moderate sodium restriction and increased consumption of fresh fruit, vegetables, and low fat dairy, alcohol moderation, and smoking cessation.;Monitor prescription use compliance.   Expected Outcomes Short Term: Continued assessment and intervention until BP is < 140/73m HG in hypertensive participants. < 130/820mHG in hypertensive participants with diabetes, heart failure or chronic kidney disease.;Long Term: Maintenance of blood pressure at goal levels.   Lipids Yes   Intervention Provide education and support for participant on nutrition & aerobic/resistive exercise along with prescribed medications to achieve LDL <7060mHDL >18m81m Expected Outcomes Short Term: Participant states understanding of desired cholesterol values and is compliant with medications prescribed. Participant is following exercise prescription and nutrition guidelines.;Long Term: Cholesterol controlled with medications as prescribed, with individualized exercise RX and with personalized nutrition plan. Value goals: LDL < 70mg19mL > 40 mg.       Personal Goals Discharge:     Goals and Risk Factor Review - 07/09/17 1111      Core Components/Risk  Factors/Patient Goals Review   Personal Goals Review Weight Management/Obesity;Diabetes   Review Waynes weight was down today to 297.5.  He staes he has reduced his insulin dosage due to exercise.   Expected Outcomes Short - [place] finish HT [place] maintain exercise on his own.      Exercise Goals and Review:     Exercise Goals    Row Name 04/01/17 1513             Exercise Goals   Increase Physical Activity Yes  Intervention Provide advice, education, support and counseling about physical activity/exercise needs.;Develop an individualized exercise prescription for aerobic and resistive training based on initial evaluation findings, risk stratification, comorbidities and participant's personal goals.       Expected Outcomes Achievement of increased cardiorespiratory fitness and enhanced flexibility, muscular endurance and strength shown through measurements of functional capacity and personal statement of participant.       Increase Strength and Stamina Yes       Intervention Provide advice, education, support and counseling about physical activity/exercise needs.;Develop an individualized exercise prescription for aerobic and resistive training based on initial evaluation findings, risk stratification, comorbidities and participant's personal goals.       Expected Outcomes Achievement of increased cardiorespiratory fitness and enhanced flexibility, muscular endurance and strength shown through measurements of functional capacity and personal statement of participant.          Nutrition & Weight - Outcomes:     Pre Biometrics - 04/01/17 1513      Pre Biometrics   Height 5' 10.8" (1.798 m)   Weight (!)  300 lb 1.6 oz (136.1 kg)   Waist Circumference 50.25 inches   Hip Circumference 54.5 inches   Waist to Hip Ratio 0.92 %   BMI (Calculated) 42.2   Single Leg Stand 1.43 seconds         Post Biometrics - 07/09/17 1654       Post  Biometrics   Height 5'  10.8" (1.798 m)   Weight 299 lb 4.8 oz (135.8 kg)   Waist Circumference 50.25 inches   Hip Circumference 54.5 inches   Waist to Hip Ratio 0.92 %   BMI (Calculated) 42   Single Leg Stand 4.09 seconds      Nutrition:     Nutrition Therapy & Goals - 04/01/17 1644      Nutrition Therapy   RD appointment defered Yes   Drug/Food Interactions Statins/Certain Fruits      Nutrition Discharge:     Nutrition Assessments - 04/01/17 1639      MEDFICTS Scores   Pre Score 67      Education Questionnaire Score:     Knowledge Questionnaire Score - 04/01/17 1634      Knowledge Questionnaire Score   Pre Score 16      Goals reviewed with patient; copy given to patient.

## 2017-07-15 NOTE — Progress Notes (Signed)
Cardiac Individual Treatment Plan  Patient Details  Name: Lawrence [name] MRN: [id number] Date of Birth: [birth year] Referring Provider:     Cardiac Rehab from 04/01/2017 in [facility] Cardiac and Pulmonary Rehab  Referring Provider  Josephina Gip MD      Initial Encounter Date:    Cardiac Rehab from 04/01/2017 in [facility] Cardiac and Pulmonary Rehab  Date  04/01/17  Referring Provider  Josephina Gip MD      Visit Diagnosis: NSTEMI (non-ST elevated myocardial infarction) [facility])  Patient's Home Medications on Admission:  Current Outpatient Prescriptions:  .  aspirin 81 MG EC tablet, Take 81 mg by mouth daily. Swallow whole., Disp: , Rfl:  .  cholecalciferol (VITAMIN D) 1000 units tablet, Take 1,000 Units by mouth daily., Disp: , Rfl:  .  clopidogrel (PLAVIX) 75 MG tablet, Take 1 tablet (75 mg total) by mouth daily., Disp: 30 tablet, Rfl: 0 .  docusate sodium (COLACE) 100 MG capsule, Take 200 mg by mouth 2 (two) times daily., Disp: , Rfl:  .  fexofenadine (ALLEGRA) 180 MG tablet, Take 180 mg by mouth daily., Disp: , Rfl:  .  furosemide (LASIX) 40 MG tablet, Take 1 tablet (40 mg total) by mouth daily., Disp: 30 tablet, Rfl: 1 .  glucagon (GLUCAGON EMERGENCY) 1 MG injection, Inject 1 mg into the vein once as needed. Reported on 02/11/2016, Disp: , Rfl:  .  insulin aspart (NOVOLOG) 100 UNIT/ML injection, Inject 20 Units into the skin 3 (three) times daily. (Patient taking differently: Inject 20 Units into the skin 3 (three) times daily with meals. ), Disp: 10 mL, Rfl: 5 .  insulin glargine (LANTUS) 100 UNIT/ML injection, Inject 0.24 mLs (24 Units total) into the skin at bedtime., Disp: 30 mL, Rfl: 1 .  isosorbide mononitrate (IMDUR) 30 MG 24 hr tablet, Take 30 mg by mouth daily., Disp: , Rfl:  .  latanoprost (XALATAN) 0.005 % ophthalmic solution, Place 1 drop into both eyes at bedtime., Disp: , Rfl:  .  metoprolol tartrate (LOPRESSOR) 25 MG tablet, Take 25 mg by mouth 2 (two) times daily., Disp: ,  Rfl:  .  Multiple Vitamin (MULTIVITAMIN) capsule, Take 1 capsule by mouth daily., Disp: , Rfl:  .  nitroGLYCERIN (NITROSTAT) 0.4 MG SL tablet, Place 1 tablet (0.4 mg total) under the tongue every 5 (five) minutes as needed for chest pain., Disp: 50 tablet, Rfl: 1 .  potassium chloride SA (K-DUR,KLOR-CON) 20 MEQ tablet, Take 1 tablet (20 mEq total) by mouth daily., Disp: 30 tablet, Rfl: 1 .  ranolazine (RANEXA) 500 MG 12 hr tablet, Take 1 tablet (500 mg total) by mouth 2 (two) times daily., Disp: 60 tablet, Rfl: 0 .  rosuvastatin (CRESTOR) 20 MG tablet, Take 1 tablet (20 mg total) by mouth daily at 6 PM., Disp: 30 tablet, Rfl: 1 .  vitamin C (ASCORBIC ACID) 500 MG tablet, Take 500 mg by mouth 2 (two) times daily. , Disp: , Rfl:   Past Medical History: Past Medical History:  Diagnosis Date  . CAD (coronary artery disease)    07/2007  . Diabetes ([place])   . Elevated lipids   . Glaucoma   . HBP (high blood pressure)   . Neuropathy   . Osteoarthritis   . Skin cancer   . Sleep apnea    CPAP    Tobacco Use: History  Smoking Status  . Former Smoker  . Packs/day: 1.00  . Years: 16.00  . Types: Cigarettes  . Start date: 11/03/1969  .  Quit date: 07/22/1986  Smokeless Tobacco  . Never Used    Labs: Recent Review Flowsheet Data    Labs for ITP Cardiac and Pulmonary Rehab Latest Ref Rng & Units 06/27/2016 06/28/2016 09/08/2016 03/18/2017 04/23/2017   Cholestrol 0 - 200 mg/dL - - 164 143 137   LDLCALC 0 - 99 mg/dL - - 69 63 61   HDL >40 mg/dL - - 81.00 67 67   Trlycerides <150 mg/dL - - 69.0 63 45   Hemoglobin A1c 4.8 - 5.6 % - - 7.9(H) 7.9(H) 7.8(H)   PHART 7.350 - 7.450 7.390 - - - -   PCO2ART 35.0 - 45.0 mmHg 45.2(H) - - - -   HCO3 20.0 - 24.0 mEq/L 27.3(H) - - - -   TCO2 0 - 100 mmol/L 29 26 - - -   O2SAT % 96.0 - - - -       Exercise Target Goals:    Exercise Program Goal: Individual exercise prescription set with THRR, safety & activity barriers. Participant demonstrates  ability to understand and report RPE using BORG scale, to self-measure pulse accurately, and to acknowledge the importance of the exercise prescription.  Exercise Prescription Goal: Starting with aerobic activity 30 plus minutes a day, 3 days per week for initial exercise prescription. Provide home exercise prescription and guidelines that participant acknowledges understanding prior to discharge.  Activity Barriers & Risk Stratification:     Activity Barriers & Cardiac Risk Stratification - 04/01/17 1644      Activity Barriers & Cardiac Risk Stratification   Activity Barriers Arthritis;Joint Problems;Muscular Weakness;Balance Concerns   Cardiac Risk Stratification High      6 Minute Walk:     6 Minute Walk    Row Name 04/01/17 1509 07/09/17 1651       6 Minute Walk   Phase Initial Discharge    Distance 1200 feet 1400 feet    Distance % Change  - 17 %    Distance Feet Change  - 200 ft    Walk Time 6 minutes 6 minutes    # of Rest Breaks 0 0    MPH 2.27 2.65    METS 2.22 2.46    RPE 12 16    VO2 Peak 7.76 8.4    Symptoms No Yes (comment)    Comments  - chest tightness 8/10    Resting HR 82 bpm 85 bpm    Resting BP 124/70 132/80    Resting Oxygen Saturation   - 98 %    Exercise Oxygen Saturation  during 6 min walk  - 98 %    Max Ex. HR 110 bpm 116 bpm    Max Ex. BP 152/64 128/58       Oxygen Initial Assessment:   Oxygen Re-Evaluation:   Oxygen Discharge (Final Oxygen Re-Evaluation):   Initial Exercise Prescription:     Initial Exercise Prescription - 04/01/17 1500      Date of Initial Exercise RX and Referring Provider   Date 04/01/17   Referring Provider Josephina Gip MD     Treadmill   MPH 1.7   Grade 0.5   Minutes 15   METs 2.42     Recumbant Elliptical   Level 1   RPM 50   Minutes 15   METs 2     Biostep-RELP   Level 1   SPM 50   Minutes 15   METs 2     Prescription Details   Frequency (times per week) 3  Duration Progress to 45  minutes of aerobic exercise without signs/symptoms of physical distress     Intensity   THRR 40-80% of Max Heartrate 110-138   Ratings of Perceived Exertion 11-13   Perceived Dyspnea 0-4     Progression   Progression Continue to progress workloads to maintain intensity without signs/symptoms of physical distress.     Resistance Training   Training Prescription Yes   Weight 3 lbs   Reps 10-15      Perform Capillary Blood Glucose checks as needed.  Exercise Prescription Changes:     Exercise Prescription Changes    Row Name 04/01/17 1500 04/15/17 1500 05/15/17 0900 05/27/17 1100 05/28/17 1000     Response to Exercise   Blood Pressure (Admit) 124/70 140/64 110/58 104/58  -   Blood Pressure (Exercise) 152/64 156/74 144/60 150/76  -   Blood Pressure (Exit)  - 100/70 120/60 114/62  -   Heart Rate (Admit) 82 bpm 107 bpm 48 bpm 67 bpm  -   Heart Rate (Exercise) 110 bpm 108 bpm 96 bpm 95 bpm  -   Heart Rate (Exit)  - 76 bpm 81 bpm 79 bpm  -   Oxygen Saturation (Admit) 97 %  -  -  -  -   Oxygen Saturation (Exercise) 97 %  -  -  -  -   Rating of Perceived Exertion (Exercise) '12 16 15 15  ' -   Symptoms none  - none none  -   Comments walk test results  -  -  -  -   Duration  - Progress to 45 minutes of aerobic exercise without signs/symptoms of physical distress Progress to 45 minutes of aerobic exercise without signs/symptoms of physical distress Progress to 45 minutes of aerobic exercise without signs/symptoms of physical distress  -   Intensity  - THRR unchanged THRR unchanged THRR unchanged  -     Progression   Progression  -  - Continue to progress workloads to maintain intensity without signs/symptoms of physical distress. Continue to progress workloads to maintain intensity without signs/symptoms of physical distress.  -   Average METs  -  - 1.2 2.2  -     Resistance Training   Training Prescription  - Yes No Yes  -   Weight  - '3 3 3  ' -   Reps  - 10-15 10-15 10-15  -      Interval Training   Interval Training  -  -  - No  -     Treadmill   MPH  - 1.7  - 1.7  -   Grade  - 0.5  - 0.5  -   Minutes  - 15  - 15  -   METs  - 2.42  - 2.42  -     Recumbant Elliptical   Level  -  - 1  -  -   Minutes  -  - 15  -  -   METs  -  - 1.4  -  -     Biostep-RELP   Level  - '1 1 1  ' -   SPM  - 50  -  -  -   Minutes  - '15 15 15  ' -   METs  -  - 1 2  -     Home Exercise Plan   Plans to continue exercise at  -  -  -  - [employer]   Frequency  -  -  -  -  Add 2 additional days to program exercise sessions.   Initial Home Exercises Provided  -  -  -  - 05/27/17   Row Name 06/09/17 1100 06/26/17 1200 07/09/17 1100         Response to Exercise   Blood Pressure (Admit)  - 148/66 132/62     Blood Pressure (Exercise) 170/80 142/72 126/62     Blood Pressure (Exit) 128/64 108/60 110/54     Heart Rate (Admit) 54 bpm 73 bpm 90 bpm     Heart Rate (Exercise) 102 bpm 102 bpm 116 bpm     Heart Rate (Exit) 73 bpm 82 bpm 84 bpm     Rating of Perceived Exertion (Exercise) '14 16 15     ' Symptoms none none none     Duration Progress to 45 minutes of aerobic exercise without signs/symptoms of physical distress Progress to 45 minutes of aerobic exercise without signs/symptoms of physical distress Progress to 45 minutes of aerobic exercise without signs/symptoms of physical distress     Intensity THRR unchanged THRR unchanged THRR unchanged       Progression   Progression Continue to progress workloads to maintain intensity without signs/symptoms of physical distress. Continue to progress workloads to maintain intensity without signs/symptoms of physical distress. Continue to progress workloads to maintain intensity without signs/symptoms of physical distress.     Average METs 2.21 2 2.4       Resistance Training   Training Prescription Yes Yes Yes     Weight '3 3 3     ' Reps 10-15 10-15 10-15       Interval Training   Interval Training No No No       Treadmill   MPH 1.7 1.7 2      Grade 0.5 0.5 1     Minutes '15 15 15     ' METs 2.42 2.54 2.81       Recumbant Elliptical   Level  - 1  -     Minutes  - 15  -     METs  - 1.6  -       Biostep-RELP   Level 1  -  -     Minutes 15  -  -     METs 2  -  -       Home Exercise Plan   Plans to continue exercise at [place] 2 additional days to program exercise sessions.  -  -     Initial Home Exercises Provided 05/27/17  -  -        Exercise Comments:     Exercise Comments    Row Name 04/06/17 1754 04/09/17 1719 04/15/17 1531       Exercise Comments First full day of exercise!  Lawrence [name] was oriented to gym and equipment including functions, settings, policies, and procedures.  Lawrence [name]'s individual exercise prescription and treatment plan were reviewed.  All starting workloads were established based on the results of the 6 minute walk test done at initial orientation visit.  The plan for exercise progression was also introduced and progression will be customized based on patient's performance and goals. [place] had chest pain during exercise.  It resolved with rest and he completed the session with no other issues. Lawrence [name] tolerated exercise well last session.  Staff will continue to monitor progress.        Exercise Goals and Review:     Exercise Goals  [place] Name 04/01/17 1513             Exercise Goals   Increase Physical Activity Yes       Intervention Provide advice, education, support and counseling about physical activity/exercise needs.;Develop an individualized exercise prescription for aerobic and resistive training based on initial evaluation findings, risk stratification, comorbidities and participant's personal goals.       Expected Outcomes Achievement of increased cardiorespiratory fitness and enhanced flexibility, muscular endurance and strength shown through measurements of functional capacity and personal statement of participant.       Increase Strength and Stamina Yes        Intervention Provide advice, education, support and counseling about physical activity/exercise needs.;Develop an individualized exercise prescription for aerobic and resistive training based on initial evaluation findings, risk stratification, comorbidities and participant's personal goals.       Expected Outcomes Achievement of increased cardiorespiratory fitness and enhanced flexibility, muscular endurance and strength shown through measurements of functional capacity and personal statement of participant.          Exercise Goals Re-Evaluation :     Exercise Goals Re-Evaluation    Row Name 04/29/17 1354 05/15/17 0935 05/27/17 1134 05/28/17 1034 05/29/17 0922     Exercise Goal Re-Evaluation   Exercise Goals Review  - Increase Physical Activity;Increase Strenth and Stamina Increase Physical Activity;Increase Strenth and Stamina Increase Physical Activity Increase Physical Activity   Comments Out since last review. Lawrence [name] is back after missing sessions.  He has tolerated exercise well and is encouraged by his doctor to exercise. Lawrence [name] is reaching RPE goals for exercise.  Regular attendance will produce better outcomes for strength and stamina. Home exercise was reviewed with [facility] 7/25.  HR RPE and safety were reviewed.  Pt voiced understanding. Lawrence [name] plans to exercise three days per week and walk at the [place] if he cant get to class on time.   Expected Outcomes  - [place] will be able to attend class on a regular basis.  [place] will build strength and stamina. [place] will  continue to attend 3 days per week.  [place] will complete the program and graduate from HT. [place] will be more active outside of classes.  Lawrence [name] will continue exercise in the [employer]. Lawrence [name] will exercise 3 days per week in HT.  Lawrence [name] will also exercise on his own outside of class.   [place] Name 06/09/17 1127 06/26/17 1211 07/09/17 1109 07/09/17 1700       Exercise Goal  Re-Evaluation   Exercise Goals Review Increase Physical Activity;Increase Strenth and Stamina Increase Physical Activity;Increase Strenth and Stamina Increase Physical Activity;Increase Strength and [place] has been doing well in rehab.  He has been walking some at home.  His blood pressures continue to run a little high during exercise.  We will continue to monitor his progression. Lawrence [name] has been attending class regularly and is paying attention to how he feels.  He started too fast and "felt like wild horses ran over me" so he slowed down and staff reviewed the importance of warm up. Lawrence [name] stated his BG is more controlled since he has been exercising. Increased by 200 ft post 6 min walk. 6MWT reviewed with patient.    Expected Outcomes Short: Increase workload on [employer]. Long: Exercise more at home and come to class regularly. Lawrence [name] will continue to attend regularly.  [place] will develop long term exercise habit and body awareness. Lawrence [name] will graduate HT [place] will maintain exercise on his own.  -       Discharge Exercise Prescription (Final Exercise Prescription Changes):     Exercise Prescription Changes - 07/09/17 1100      Response to Exercise   Blood Pressure (Admit) 132/62   Blood Pressure (Exercise) 126/62   Blood Pressure (Exit) 110/54   Heart Rate (Admit) 90 bpm   Heart Rate (Exercise) 116 bpm   Heart Rate (Exit) 84 bpm   Rating of Perceived Exertion (Exercise) 15   Symptoms none   Duration Progress to 45 minutes of aerobic exercise without signs/symptoms of physical distress   Intensity THRR unchanged     Progression   Progression Continue to progress workloads to maintain intensity without signs/symptoms of physical distress.   Average METs 2.4     Resistance Training   Training Prescription Yes   Weight 3   Reps 10-15     Interval Training   Interval Training No     Treadmill   MPH 2   Grade 1   Minutes 15   METs  2.81      Nutrition:  Target Goals: Understanding of nutrition guidelines, daily intake of sodium <1549m, cholesterol <2053m calories 30% from fat and 7% or less from saturated fats, daily to have 5 or more servings of fruits and vegetables.  Biometrics:     Pre Biometrics - 04/01/17 1513      Pre Biometrics   Height 5' 10.8" (1.798 m)   Weight (!)  300 lb 1.6 oz (136.1 kg)   Waist Circumference 50.25 inches   Hip Circumference 54.5 inches   Waist to Hip Ratio 0.92 %   BMI (Calculated) 42.2   Single Leg Stand 1.43 seconds         Post Biometrics - 07/09/17 1654       Post  Biometrics   Height 5' 10.8" (1.798 m)   Weight 299 lb 4.8 oz (135.8 kg)   Waist Circumference 50.25 inches   Hip Circumference 54.5 inches   Waist to Hip Ratio 0.92 %   BMI (Calculated) 42   Single Leg Stand 4.09 seconds      Nutrition Therapy Plan and Nutrition Goals:     Nutrition Therapy & Goals - 04/01/17 1644      Nutrition Therapy   RD appointment defered Yes   Drug/Food Interactions Statins/Certain Fruits      Nutrition Discharge: Rate Your Plate Scores:     Nutrition Assessments - 04/01/17 1639      MEDFICTS Scores   Pre Score 67      Nutrition Goals Re-Evaluation:   Nutrition Goals Discharge (Final Nutrition Goals Re-Evaluation):   Psychosocial: Target Goals: Acknowledge presence or absence of significant depression and/or stress, maximize coping skills, provide positive support system. Participant is able to verbalize types and ability to use techniques and skills needed for reducing stress and depression.   Initial Review & Psychosocial Screening:     Initial Psych Review & Screening - 04/01/17 1646      Initial Review   Current issues with Current Sleep Concerns     Family Dynamics   Good Support System? Yes     Barriers   Psychosocial barriers to participate in program The patient should benefit from training in stress management and relaxation.      Screening Interventions   Interventions Encouraged to  exercise      Quality of Life Scores:      Quality of Life - 04/01/17 1629      Quality of Life Scores   Health/Function Pre 11.2 %   Socioeconomic Pre 22.75 %   Psych/Spiritual Pre 19.5 %   Family Pre 25.5 %   GLOBAL Pre 16.97 %      PHQ-9: Recent Review Flowsheet Data    Depression screen [facility] 2/9 04/01/2017 12/12/2016 09/11/2016 09/05/2016   Decreased Interest 1 1 0 0   Down, Depressed, Hopeless '1 1 1 ' 0   PHQ - 2 Score '2 2 1 ' 0   Altered sleeping '1 3 1 ' -   Tired, decreased energy '1 1 1 ' -   Change in appetite 2 1 0 -   Feeling bad or failure about yourself  '2 1 1 ' -   Trouble concentrating '1 1 1 ' -   Moving slowly or fidgety/restless '1 1 1 ' -   Suicidal thoughts 0 0 0 -   PHQ-9 Score '10 10 6 ' -   Difficult doing work/chores Somewhat difficult Somewhat difficult Somewhat difficult -     Interpretation of Total Score  Total Score Depression Severity:  1-4 = Minimal depression, 5-9 = Mild depression, 10-14 = Moderate depression, 15-19 = Moderately severe depression, 20-27 = Severe depression   Psychosocial Evaluation and Intervention:     Psychosocial Evaluation - 04/06/17 1748      Psychosocial Evaluation & Interventions   Interventions Stress management education;Relaxation education;Encouraged to exercise with the program and follow exercise prescription   Comments Counselor met with Lawrence [name] [facility]) today for return to this Cardiac Rehab program subsequent to another heart attack several weeks ago.  He continues to have a strong support system with his spouse; and some wonderful neighbors who are like family to him.  He also is actively involved in his local church.  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  Lawrence [name] also had his left toe amputated several months ago.  He states he  sleeps well with his CPAP and has a good appetite.  He has no "official" diagnosis of depression or anxiety, but he admits he is struggling lately with a PHQ-9 of "10" and he was tearful at times talking to this counselor.  Lawrence [name] reports his greatest stressor is his health.  He has goals to get stronger overall and have less shortness of breath.   Counselor  commended Lawrence [name] for coming back into this program and his commitment to consistent exercise to reach his stated goals.  He will be followed by staff throughout the course of this program.    Expected [place] will benefit from consistent exercise to achieve his stated goals.  He will also benefit from the psychoeducational components of this program to help with his mood and managing stress in his life.  Lawrence [name] may need to see a counselor if these symptoms do now improve in the near future.     Continue Psychosocial Services  Follow up required by staff      Psychosocial Re-Evaluation:     Psychosocial Re-Evaluation    [place] Name 05/20/17 1723 06/26/17 1216 07/01/17 1739 07/08/17 1701       Psychosocial Re-Evaluation   Current issues with  - Current Stress Concerns  -  -    Comments Counselor follow up with Lawrence [name] today reporting  his Dr. put him back on a former medication recently which has helped reduce the tightness in his chest and also decrease his pain levels overall.  Lawrence [name] states he feels better overall and enjoys coming to each session.  He has also benefitted from exercise and reports a decreased need for insulin recently - which is a positive as well.  Lawrence [name] continues to struggle with coping with the tasks he can no longer do as a result of his health issues.  He also continues to be concerned about his spouse's health issues; but admits he is coping better overall.  And he looks forward to completion of this program so he and his spouse can return to fit for Life classes together.  Counselor and staff will continue to follow with  [place].   Lawrence [name] is in PT for balance issues.  He is ready for them to come to [employer] together. Counselor follow up with [facility] today reporting he is scheduled for an MRI tomorrow morning due to his bouts with dizziness.  Lawrence [name] was in a positive mood and even laughed about this with counselor.   Counselor follow up with [facility] reporting the MRI did not detect anything of concern and attributed the dizziness to "aging."  Lawrence [name] will be attending the [place] at the ENT office locally to help manage these symptoms better.  He begins that next week.  He continues to have a positive attitude and work hard in this program.      Expected Outcomes  - [place] will continue in [place] and his [name] will be able to attend FF together. Lawrence [name] will have an MRI to determine if there is something to help decrease his dizziness.  Staff will follow.  -    Interventions  - Encouraged to attend Cardiac Rehabilitation for the exercise  -  -       Psychosocial Discharge (Final Psychosocial Re-Evaluation):     Psychosocial Re-Evaluation - 07/08/17 1701      Psychosocial Re-Evaluation   Comments Counselor follow up with Lawrence [name] reporting the MRI did not detect anything of concern and attributed the dizziness to "aging."  Lawrence [name] will be attending the [place] at the ENT office locally to help manage these symptoms better.  He begins that next week.  He continues to have a positive attitude and work hard in this program.        Vocational Rehabilitation: Provide vocational rehab assistance to qualifying candidates.   Vocational Rehab Evaluation & Intervention:     Vocational Rehab - 04/01/17 1635      Initial Vocational Rehab Evaluation & Intervention   Assessment shows need for Vocational Rehabilitation No      Education: Education Goals: Education classes will be provided on a variety of topics geared toward better understanding of heart health and risk factor modification.  Participant will state understanding/return demonstration of topics presented as noted by education test scores.  Learning Barriers/Preferences:     Learning Barriers/Preferences - 04/01/17 1634      Learning Barriers/Preferences   Learning Barriers Sight   Learning Preferences None      Education Topics: General Nutrition Guidelines/Fats and Fiber: -Group instruction provided by verbal, written material, models and posters to present the general guidelines for heart healthy nutrition. Gives an explanation and review of dietary fats and fiber.   Controlling Sodium/Reading Food Labels: -Group verbal and written material supporting the discussion of sodium use in heart healthy nutrition. Review  and explanation with models, verbal and written materials for utilization of the food label.   Cardiac Rehab from 12/15/2016 in [facility] Cardiac and Pulmonary Rehab  Date  09/22/16  Educator  PI  Instruction Review Code (retired)  2- meets goals/outcomes      Exercise Physiology & Risk Factors: - Group verbal and written instruction with models to review the exercise physiology of the cardiovascular system and associated critical values. Details cardiovascular disease risk factors and the goals associated with each risk factor.   Cardiac Rehab from 07/13/2017 in [facility] Cardiac and Pulmonary Rehab  Date  07/08/17  Educator  Nada Maclachlan, EP  Instruction Review Code  2- Demonstrated Understanding      Aerobic Exercise & Resistance Training: - Gives group verbal and written discussion on the health impact of inactivity. On the components of aerobic and resistive training programs and the benefits of this training and how to safely progress through these programs.   Cardiac Rehab from 07/13/2017 in [facility] Cardiac and Pulmonary Rehab  Date  07/13/17  Educator  [facility]  Instruction Review Code  1- Geologist, engineering, Balance, General Exercise Guidelines: - Provides group verbal  and written instruction on the benefits of flexibility and balance training programs. Provides general exercise guidelines with specific guidelines to those with heart or lung disease. Demonstration and skill practice provided.   Cardiac Rehab from 07/13/2017 in [facility] Cardiac and Pulmonary Rehab  Date  05/18/17  Educator  AS      Stress Management: - Provides group verbal and written instruction about the health risks of elevated stress, cause of high stress, and healthy ways to reduce stress.   Cardiac Rehab from 07/13/2017 in [facility] Cardiac and Pulmonary Rehab  Date  05/27/17  Educator  [place]      Depression: - Provides group verbal and written instruction on the correlation between heart/lung disease and depressed mood, treatment options, and the stigmas associated with seeking treatment.   Cardiac Rehab from 07/13/2017 in [facility] Cardiac and Pulmonary Rehab  Date  06/24/17  Educator  [facility]  Instruction Review Code (retired)  2- meets Designer, fashion/clothing & Physiology of the Heart: - Group verbal and written instruction and models provide basic cardiac anatomy and physiology, with the coronary electrical and arterial systems. Review of: AMI, Angina, Valve disease, Heart Failure, Cardiac Arrhythmia, Pacemakers, and the ICD.   Cardiac Rehab from 12/15/2016 in [facility] Cardiac and Pulmonary Rehab  Date  12/08/16  Educator  C. [place]  Instruction Review Code (retired)  2- meets goals/outcomes      Cardiac Procedures: - Group verbal and written instruction to review commonly prescribed medications for heart disease. Reviews the medication, class of the drug, and side effects. Includes the steps to properly store meds and maintain the prescription regimen. (beta blockers and nitrates)   Cardiac Rehab from 07/13/2017 in [facility] Cardiac and Pulmonary Rehab  Date  04/06/17  Educator  SB      Cardiac Medications I: - Group verbal and written instruction to review commonly prescribed medications  for heart disease. Reviews the medication, class of the drug, and side effects. Includes the steps to properly store meds and maintain the prescription regimen.   Cardiac Rehab from 07/13/2017 in [facility] Cardiac and Pulmonary Rehab  Date  06/08/17 [Part 2 06/10/17]  Educator  MJA, RN [Part 2 SB]      Cardiac Medications II: -Group verbal and written instruction to review commonly prescribed  medications for heart disease. Reviews the medication, class of the drug, and side effects. (all other drug classes)    Go Sex-Intimacy & Heart Disease, Get SMART - Goal Setting: - Group verbal and written instruction through game format to discuss heart disease and the return to sexual intimacy. Provides group verbal and written material to discuss and apply goal setting through the application of the S.M.A.R.T. Method.   Cardiac Rehab from 07/13/2017 in [facility] Cardiac and Pulmonary Rehab  Date  04/06/17  Educator  SB      Other Matters of the Heart: - Provides group verbal, written materials and models to describe Heart Failure, Angina, Valve Disease, Peripheral Artery Disease, and Diabetes in the realm of heart disease. Includes description of the disease process and treatment options available to the cardiac patient.   Cardiac Rehab from 12/15/2016 in [facility] Cardiac and Pulmonary Rehab  Date  12/08/16  Educator  C. [place]  Instruction Review Code (retired)  2- meets goals/outcomes      Exercise & Equipment Safety: - Individual verbal instruction and demonstration of equipment use and safety with use of the equipment.   Cardiac Rehab from 07/13/2017 in [facility] Cardiac and Pulmonary Rehab  Date  04/01/17  Educator  C. EnterkinRN      Infection Prevention: - Provides verbal and written material to individual with discussion of infection control including proper hand washing and proper equipment cleaning during exercise session.   Cardiac Rehab from 07/13/2017 in [facility] Cardiac and Pulmonary Rehab  Date   04/01/17  Educator  C. EnterkinRN      Falls Prevention: - Provides verbal and written material to individual with discussion of falls prevention and safety.   Cardiac Rehab from 07/13/2017 in [facility] Cardiac and Pulmonary Rehab  Date  04/01/17  Educator  C. [place]  Instruction Review Code (retired)  2- meets goals/outcomes      Diabetes: - Individual verbal and written instruction to review signs/symptoms of diabetes, desired ranges of glucose level fasting, after meals and with exercise. Acknowledge that pre and post exercise glucose checks will be done for 3 sessions at entry of program.   Cardiac Rehab from 07/13/2017 in [facility] Cardiac and Pulmonary Rehab  Date  04/01/17  Educator  C. EnterkinRN      Other: -Provides group and verbal instruction on various topics (see comments)    Knowledge Questionnaire Score:     Knowledge Questionnaire Score - 04/01/17 1634      Knowledge Questionnaire Score   Pre Score 16      Core Components/Risk Factors/Patient Goals at Admission:     Personal Goals and Risk Factors at Admission - 04/01/17 1645      Core Components/Risk Factors/Patient Goals on Admission   Admit Weight 300 lb (136.1 kg)   Goal Weight: Short Term 295 lb (133.8 kg)   Goal Weight: Long Term 220 lb (99.8 kg)   Expected Outcomes Short Term: Continue to assess and modify interventions until short term weight is achieved;Weight Loss: Understanding of general recommendations for a balanced deficit meal plan, which promotes 1-2 lb weight loss per week and includes a negative energy balance of [id number] kcal/d   Diabetes Yes   Intervention Provide education about signs/symptoms and action to take for hypo/hyperglycemia.;Provide education about proper nutrition, including hydration, and aerobic/resistive exercise prescription along with prescribed medications to achieve blood glucose in normal ranges: Fasting glucose 65-99 mg/dL   Expected Outcomes Short Term: Participant  verbalizes understanding of the signs/symptoms and immediate care  of hyper/hypoglycemia, proper foot care and importance of medication, aerobic/resistive exercise and nutrition plan for blood glucose control.;Long Term: Attainment of HbA1C < 7%.   Hypertension Yes   Intervention Provide education on lifestyle modifcations including regular physical activity/exercise, weight management, moderate sodium restriction and increased consumption of fresh fruit, vegetables, and low fat dairy, alcohol moderation, and smoking cessation.;Monitor prescription use compliance.   Expected Outcomes Short Term: Continued assessment and intervention until BP is < 140/24m HG in hypertensive participants. < 130/822mHG in hypertensive participants with diabetes, heart failure or chronic kidney disease.;Long Term: Maintenance of blood pressure at goal levels.   Lipids Yes   Intervention Provide education and support for participant on nutrition & aerobic/resistive exercise along with prescribed medications to achieve LDL <7070mHDL >79m54m Expected Outcomes Short Term: Participant states understanding of desired cholesterol values and is compliant with medications prescribed. Participant is following exercise prescription and nutrition guidelines.;Long Term: Cholesterol controlled with medications as prescribed, with individualized exercise RX and with personalized nutrition plan. Value goals: LDL < 70mg73mL > 40 mg.      Core Components/Risk Factors/Patient Goals Review:      Goals and Risk Factor Review    Row Name 05/29/17 0918 93714/18 1213 07/09/17 1111         Core Components/Risk Factors/Patient Goals Review   Personal Goals Review Weight Management/Obesity;Diabetes Weight Management/Obesity;Diabetes Weight Management/Obesity;Diabetes     Review Lawrence Jupiterlost about 2-3 lb.  His [name] took all the candy out of the house.  His BG is still out of whack at times but it is staying under 250.  His goal is to  attend three days per week exercise on a regular basis.   Lawrence Jupiteres his weight still fluctuates due to fluid.  He is still not eating candy.  His BG has been more steady and he is working with his Dr.  to decrease insulin. Waynes weight was down today to 297.5.  He staes he has reduced his insulin dosage due to exercise.     Expected [place] continue to monitor his BG and weight.  Long [place] have stable BG and lose weight. [place] continue healthier dietary habits.  Long [place] control his BG better overall. [place] finish HT [place] maintain exercise on his own.        Core Components/Risk Factors/Patient Goals at Discharge (Final Review):      Goals and Risk Factor Review - 07/09/17 1111      Core Components/Risk Factors/Patient Goals Review   Personal Goals Review Weight Management/Obesity;Diabetes   Review [place] weight was down today to 297.5.  He staes he has reduced his insulin dosage due to exercise.   Expected Outcomes Short - Lawrence [name] finish HT Long [place] maintain exercise on his own.      ITP Comments:     ITP Comments    Row Name 04/01/17 1643 04/09/17 1720 04/14/17 1209 04/22/17 0556 04/29/17 1353   ITP Comments  Medical review started after informed consent was signed for Cardiac REhab then ITP was created. Diagnosis in CHL/EPIC. [place] pain during exercise.  It resolved with rest and he completed the session with no other issues. Iann continues to have angina symptoms with exercise and at home at rest.  Reviewing his meds with him, we discovered he ahd been on Renexa and has been off since one of  his hospitalizations.  I advised Lawrence [name] to call his physician and ask about resumong the Renexa for possible symptom control.  30 day review. Continue with ITP unless directed changes per Medical Director review Called to check on status of pt.  He was admitted for chest pain. All labs were negative  and they cancelled his cath until after his follow up as outpatient.  He has a follow up appointment on July 9th.  He will need clearance to return to rehab.   Lawrence [name] Name 05/20/17 0655 05/28/17 1036 06/17/17 0628 06/17/17 1631 06/25/17 1702   ITP Comments 30 day review. Continue with ITP unless directed changes per Medical Director review     Med changes have helped with symptom control per [facility] exercise was reviewed with [facility] 7/25.  HR RPE and safety were reviewed.  Pt voiced understanding. 30 day review. Continue with ITP unless directed changes per Medical Director review  Lawrence [name] did not complete his rehab session.  Lawrence [name] blood sugar was checked before exercise and was 86. He was given a snack and sent home. Lawrence [name] is to resume Thursday. Lawrence [name] felt dizzy during exercise. Exercise stopped after he felt dizzy, resumed after drinking water and dizziness subsided.  His blood pressure was 102/60 and his blood sugar was 151.    Roland Name 07/09/17 1648 07/15/17 1126         ITP Comments Lawrence [name] did not feel good after his 6 minute walk. Blood sugar checked at 224. He continued to exercise without incident. 30 day review. Continue with ITP unless directed changes per Medical Director review.           Comments:

## 2017-07-15 NOTE — Progress Notes (Signed)
Daily Session Note  Patient Details  Name: Lawrence [name] MRN: [id number] Date of Birth: Dec 08, 1950 Referring Provider:     Cardiac Rehab from 04/01/2017 in [facility] Cardiac and Pulmonary Rehab  Referring Provider  Lawrence Gip MD      Encounter Date: 07/15/2017  Check In:     Session Check In - 07/15/17 1648      Check-In   Location [facility]-Cardiac & Pulmonary Rehab   Staff Present Lawrence Papa, RN BSN;Lawrence Enterkin, RN, Lawrence [name], BA, ACSM CEP, Exercise Physiologist   Supervising physician immediately available to respond to emergencies See telemetry face sheet for immediately available ER MD   Medication changes reported     No   Fall or balance concerns reported    No   Warm-up and Cool-down Performed on first and last piece of equipment   Resistance Training Performed Yes   VAD Patient? No     Pain Assessment   Currently in Pain? No/denies         History  Smoking Status  . Former Smoker  . Packs/day: 1.00  . Years: 16.00  . Types: Cigarettes  . Start date: 11/03/1969  . Quit date: 07/22/1986  Smokeless Tobacco  . Never Used    Goals Met:  Proper associated with RPD/PD & O2 Sat [place] with exercise equipment Exercise tolerated well No report of cardiac concerns or symptoms Strength training completed today  Goals Unmet:  Not Applicable  Comments:  Lawrence [name] graduated today from cardiac rehab with 36 sessions completed.  Details of the patient's exercise prescription and what He needs to do in order to continue the prescription and progress were discussed with patient.  Patient was given a copy of prescription and goals.  Patient verbalized understanding.  Lawrence [name] plans to continue to exercise by attending [employer].    Dr. Emily [name] is Medical Director for [place] and [facility].

## 2017-07-20 DIAGNOSIS — R262 Difficulty in walking, not elsewhere classified: Secondary | ICD-10-CM | POA: Diagnosis not present

## 2017-07-20 DIAGNOSIS — R2681 Unsteadiness on feet: Secondary | ICD-10-CM | POA: Diagnosis not present

## 2017-07-23 DIAGNOSIS — R262 Difficulty in walking, not elsewhere classified: Secondary | ICD-10-CM | POA: Diagnosis not present

## 2017-07-23 DIAGNOSIS — R2681 Unsteadiness on feet: Secondary | ICD-10-CM | POA: Diagnosis not present

## 2017-08-03 DIAGNOSIS — R2681 Unsteadiness on feet: Secondary | ICD-10-CM | POA: Diagnosis not present

## 2017-08-03 DIAGNOSIS — R262 Difficulty in walking, not elsewhere classified: Secondary | ICD-10-CM | POA: Diagnosis not present

## 2017-08-05 DIAGNOSIS — R262 Difficulty in walking, not elsewhere classified: Secondary | ICD-10-CM | POA: Diagnosis not present

## 2017-08-05 DIAGNOSIS — R2681 Unsteadiness on feet: Secondary | ICD-10-CM | POA: Diagnosis not present

## 2017-08-10 DIAGNOSIS — R262 Difficulty in walking, not elsewhere classified: Secondary | ICD-10-CM | POA: Diagnosis not present

## 2017-08-10 DIAGNOSIS — R2681 Unsteadiness on feet: Secondary | ICD-10-CM | POA: Diagnosis not present

## 2017-08-12 DIAGNOSIS — R262 Difficulty in walking, not elsewhere classified: Secondary | ICD-10-CM | POA: Diagnosis not present

## 2017-08-12 DIAGNOSIS — R2681 Unsteadiness on feet: Secondary | ICD-10-CM | POA: Diagnosis not present

## 2017-08-20 ENCOUNTER — Encounter: Payer: Self-pay | Admitting: Primary Care

## 2017-08-20 ENCOUNTER — Ambulatory Visit (INDEPENDENT_AMBULATORY_CARE_PROVIDER_SITE_OTHER): Payer: Medicare Other | Admitting: Primary Care

## 2017-08-20 VITALS — BP 142/70 | HR 76 | Temp 98.5°F | Wt 299.5 lb

## 2017-08-20 DIAGNOSIS — J069 Acute upper respiratory infection, unspecified: Secondary | ICD-10-CM | POA: Diagnosis not present

## 2017-08-20 DIAGNOSIS — B9789 Other viral agents as the cause of diseases classified elsewhere: Secondary | ICD-10-CM

## 2017-08-20 DIAGNOSIS — J302 Other seasonal allergic rhinitis: Secondary | ICD-10-CM

## 2017-08-20 DIAGNOSIS — R2681 Unsteadiness on feet: Secondary | ICD-10-CM | POA: Diagnosis not present

## 2017-08-20 DIAGNOSIS — R262 Difficulty in walking, not elsewhere classified: Secondary | ICD-10-CM | POA: Diagnosis not present

## 2017-08-20 DIAGNOSIS — I200 Unstable angina: Secondary | ICD-10-CM | POA: Diagnosis not present

## 2017-08-20 NOTE — Progress Notes (Signed)
Subjective:    Patient ID: Lawrence [name], male    DOB: 11/05/1950, 66 y.o.   MRN: [id number]  HPI  Lawrence [name] is a 66 year old male with a history of CAD, OSA, GERD, seasonal allergies, morbid obesity who presents today with a chief complaint of nasal congestion. He also reports chest congestion, fatigue. He has a mild cough and mild sinus pressure. He will experience chest wall pain when he does have a cough. His symptoms began one week ago. He's compliant to his Allegra, and has also intermittently been taking Alka-Selzter or Dayquil. His wife was recently diagnosed with a sinus infection. Overall he's feeling about the same.  Review of Systems  Constitutional: Positive for fatigue. Negative for chills and fever.  HENT: Positive for congestion and sinus pressure. Negative for sore throat.   Respiratory: Positive for cough. Negative for shortness of breath and wheezing.   Cardiovascular: Negative for chest pain.       Past Medical History:  Diagnosis Date  . CAD (coronary artery disease)    07/2007  . Diabetes ([place])   . Elevated lipids   . Glaucoma   . HBP (high blood pressure)   . Neuropathy   . Osteoarthritis   . Skin cancer   . Sleep apnea    CPAP     Social History   Social History  . Marital status: Married    Spouse name: N/A  . Number of children: N/A  . Years of education: N/A   Occupational History  . Not on file.   Social History Main Topics  . Smoking status: Former Smoker    Packs/day: 1.00    Years: 16.00    Types: Cigarettes    Start date: 11/03/1969    Quit date: 07/22/1986  . Smokeless tobacco: Never Used  . Alcohol use 0.0 oz/week     Comment: OCCASIONALLY  . Drug use: No  . Sexual activity: No   Other Topics Concern  . Not on file   Social History Narrative   Married.  Lives at home with wife.     Past Surgical History:  Procedure Laterality Date  . APPENDECTOMY    . CARDIAC CATHETERIZATION N/A 06/11/2016   Procedure: Left Heart Cath  and Coronary Angiography;  Surgeon: Corey Skains, MD;  Location: [place] [facility];  Service: Cardiovascular;  Laterality: N/A;  . CARPAL TUNNEL RELEASE Left 04/24/2016   Procedure: CARPAL TUNNEL RELEASE;  Surgeon: Hessie Knows, MD;  Location: [facility] ORS;  Service: Orthopedics;  Laterality: Left;  . CATARACT EXTRACTION W/ INTRAOCULAR LENS  IMPLANT, BILATERAL Bilateral   . CATARACT EXTRACTION, BILATERAL    . CORONARY ARTERY BYPASS GRAFT N/A 06/27/2016   Procedure: CORONARY ARTERY BYPASS GRAFTING times four using left internal mammary artery and right leg saphenous vein;  Surgeon: Ivin Poot, MD;  Location: [place];  Service: Open Heart Surgery;  Laterality: N/A;  . EYE SURGERY Bilateral   . JOINT REPLACEMENT    . KNEE ARTHROPLASTY Left 02/11/2016   Procedure: COMPUTER ASSISTED TOTAL KNEE ARTHROPLASTY;  Surgeon: Dereck Leep, MD;  Location: [facility] ORS;  Service: Orthopedics;  Laterality: Left;  . KNEE ARTHROSCOPY Right   . LEFT HEART CATH AND CORS/GRAFTS ANGIOGRAPHY Left 01/13/2017   Procedure: Left Heart Cath and Cors/Grafts Angiography;  Surgeon: Corey Skains, MD;  Location: [place] [facility];  Service: Cardiovascular;  Laterality: Left;  . TEE WITHOUT CARDIOVERSION N/A 06/27/2016   Procedure: TRANSESOPHAGEAL ECHOCARDIOGRAM (TEE);  Surgeon: Ivin Poot, MD;  Location: [place];  Service: Open Heart Surgery;  Laterality: N/A;    Family History  Problem Relation Age of Onset  . Drug abuse Brother   . Hypertension Mother   . Lupus Mother   . Hypertension Father   . Diabetes Father   . Heart attack Father 14       died in his 58s  . Arthritis Maternal Grandmother   . Arthritis Maternal Grandfather   . Arthritis Paternal Grandmother   . Arthritis Paternal Grandfather   . Heart disease Brother     Allergies  Allergen Reactions  . Nitroglycerin Nausea Only and Other (See Comments)    Patches only - headache  . Statins Other (See Comments)    BODY PAIN   . Lipitor  [Atorvastatin] Other (See Comments) and Rash    BODY PAIN  . Lisinopril Other (See Comments)    Light headed, dizzy    Current Outpatient Prescriptions on File Prior to Visit  Medication Sig Dispense Refill  . aspirin 81 MG EC tablet Take 81 mg by mouth daily. Swallow whole.    . cholecalciferol (VITAMIN D) 1000 units tablet Take 1,000 Units by mouth daily.    . clopidogrel (PLAVIX) 75 MG tablet Take 1 tablet (75 mg total) by mouth daily. 30 tablet 0  . docusate sodium (COLACE) 100 MG capsule Take 200 mg by mouth 2 (two) times daily.    . fexofenadine (ALLEGRA) 180 MG tablet Take 180 mg by mouth daily.    . furosemide (LASIX) 40 MG tablet Take 1 tablet (40 mg total) by mouth daily. 30 tablet 1  . glucagon (GLUCAGON EMERGENCY) 1 MG injection Inject 1 mg into the vein once as needed. Reported on 02/11/2016    . insulin aspart (NOVOLOG) 100 UNIT/ML injection Inject 20 Units into the skin 3 (three) times daily. (Patient taking differently: Inject 20 Units into the skin 3 (three) times daily with meals. ) 10 mL 5  . insulin glargine (LANTUS) 100 UNIT/ML injection Inject 0.24 mLs (24 Units total) into the skin at bedtime. 30 mL 1  . isosorbide mononitrate (IMDUR) 30 MG 24 hr tablet Take 30 mg by mouth daily.    [name] latanoprost (XALATAN) 0.005 % ophthalmic solution Place 1 drop into both eyes at bedtime.    . metoprolol tartrate (LOPRESSOR) 25 MG tablet Take 25 mg by mouth 2 (two) times daily.    . Multiple Vitamin (MULTIVITAMIN) capsule Take 1 capsule by mouth daily.    . nitroGLYCERIN (NITROSTAT) 0.4 MG SL tablet Place 1 tablet (0.4 mg total) under the tongue every 5 (five) minutes as needed for chest pain. 50 tablet 1  . potassium chloride SA (K-DUR,KLOR-CON) 20 MEQ tablet Take 1 tablet (20 mEq total) by mouth daily. 30 tablet 1  . ranolazine (RANEXA) 500 MG 12 hr tablet Take 1 tablet (500 mg total) by mouth 2 (two) times daily. 60 tablet 0  . rosuvastatin (CRESTOR) 20 MG tablet Take 1 tablet (20 mg  total) by mouth daily at 6 PM. 30 tablet 1  . vitamin C (ASCORBIC ACID) 500 MG tablet Take 500 mg by mouth 2 (two) times daily.      No current facility-administered medications on file prior to visit.     BP (!) 142/70   Pulse 76   Temp 98.5 F (36.9 C) (Oral)   Wt 299 lb 8 oz (135.9 kg)   SpO2 97%   BMI 42.01 kg/m  Objective:   Physical Exam  Constitutional: He appears well-nourished. He does not appear ill.  HENT:  Right Ear: Tympanic membrane and ear canal normal.  Left Ear: Tympanic membrane and ear canal normal.  Nose: Mucosal edema present. Right sinus exhibits no maxillary sinus tenderness and no frontal sinus tenderness. Left sinus exhibits no maxillary sinus tenderness and no frontal sinus tenderness.  Mouth/Throat: Oropharynx is clear and moist.  Eyes: Conjunctivae are normal.  Neck: Neck supple.  Cardiovascular: Normal rate and regular rhythm.   Pulmonary/Chest: Effort normal and breath sounds normal. He has no wheezes. He has no rales.  No cough during exam.  Skin: Skin is warm and dry.          Assessment & Plan:  Allergic Rhinitis vs URI:  Nasal congestion x 1 week, also with chest congestin and mild cough. Exam today with clear lungs, normal vitals, doesn't appear ill. Suspect symptoms are secondary to allergies, could also be partly viral. Will have him continue Dayquil or cough drops PRN as these are helping. Discussed Allegra, Flonase, hydration. He will call if symptoms do not improve in 2-4 days.  Sheral Flow, NP

## 2017-08-20 NOTE — Patient Instructions (Addendum)
Your symptoms are likely secondary to allergies or a viral infection.  Continue Allegra.  Continue Dayquil or cough drops as needed for cough.  Please notify me if you develop persistent fevers of 101, start coughing up green mucous, notice increased fatigue or weakness, or feel worse on Monday next week.    It was a pleasure to see you today!

## 2017-08-24 DIAGNOSIS — R2681 Unsteadiness on feet: Secondary | ICD-10-CM | POA: Diagnosis not present

## 2017-08-24 DIAGNOSIS — R262 Difficulty in walking, not elsewhere classified: Secondary | ICD-10-CM | POA: Diagnosis not present

## 2017-08-26 DIAGNOSIS — R42 Dizziness and giddiness: Secondary | ICD-10-CM | POA: Diagnosis not present

## 2017-09-14 DIAGNOSIS — Z6841 Body Mass Index (BMI) 40.0 and over, adult: Secondary | ICD-10-CM | POA: Diagnosis not present

## 2017-09-14 DIAGNOSIS — E782 Mixed hyperlipidemia: Secondary | ICD-10-CM | POA: Diagnosis not present

## 2017-09-14 DIAGNOSIS — I10 Essential (primary) hypertension: Secondary | ICD-10-CM | POA: Diagnosis not present

## 2017-09-14 DIAGNOSIS — I25708 Atherosclerosis of coronary artery bypass graft(s), unspecified, with other forms of angina pectoris: Secondary | ICD-10-CM | POA: Diagnosis not present

## 2017-09-14 DIAGNOSIS — R079 Chest pain, unspecified: Secondary | ICD-10-CM | POA: Diagnosis not present

## 2017-09-16 ENCOUNTER — Other Ambulatory Visit: Payer: Self-pay | Admitting: Primary Care

## 2017-09-16 DIAGNOSIS — I200 Unstable angina: Secondary | ICD-10-CM

## 2017-09-16 MED ORDER — NITROGLYCERIN 0.4 MG SL SUBL: 0.4000 mg | SUBLINGUAL_TABLET | SUBLINGUAL | 1 refills | Status: DC | PRN

## 2017-09-18 ENCOUNTER — Ambulatory Visit (INDEPENDENT_AMBULATORY_CARE_PROVIDER_SITE_OTHER): Payer: Medicare Other

## 2017-09-18 DIAGNOSIS — Z23 Encounter for immunization: Secondary | ICD-10-CM

## 2017-09-22 ENCOUNTER — Encounter: Payer: Self-pay | Admitting: Podiatry

## 2017-09-22 ENCOUNTER — Ambulatory Visit (INDEPENDENT_AMBULATORY_CARE_PROVIDER_SITE_OTHER): Payer: Medicare Other | Admitting: Podiatry

## 2017-09-22 DIAGNOSIS — M79676 Pain in unspecified toe(s): Secondary | ICD-10-CM | POA: Diagnosis not present

## 2017-09-22 DIAGNOSIS — B351 Tinea unguium: Secondary | ICD-10-CM | POA: Diagnosis not present

## 2017-09-22 DIAGNOSIS — E0842 Diabetes mellitus due to underlying condition with diabetic polyneuropathy: Secondary | ICD-10-CM

## 2017-09-27 NOTE — Progress Notes (Signed)
   SUBJECTIVE Patient with a history of diabetes mellitus presents to office today complaining of elongated, thickened nails. Pain while ambulating in shoes. Patient is unable to trim their own nails.   Past Medical History:  Diagnosis Date  . CAD (coronary artery disease)    07/2007  . Diabetes ([place])   . Elevated lipids   . Glaucoma   . HBP (high blood pressure)   . Neuropathy   . Osteoarthritis   . Skin cancer   . Sleep apnea    CPAP    OBJECTIVE General Patient is awake, alert, and oriented x 3 and in no acute distress. Derm Skin is dry and supple bilateral. Negative open lesions or macerations. Remaining integument unremarkable. Nails are tender, long, thickened and dystrophic with subungual debris, consistent with onychomycosis, 1-5 bilateral. No signs of infection noted. Vasc  DP and PT pedal pulses palpable bilaterally. Temperature gradient within normal limits.  Neuro Epicritic and protective threshold sensation diminished bilaterally.  Musculoskeletal Exam No symptomatic pedal deformities noted bilateral. Muscular strength within normal limits.  ASSESSMENT 1. Diabetes Mellitus w/ peripheral neuropathy 2. Onychomycosis of nail due to dermatophyte bilateral 3. Pain in foot bilateral  PLAN OF CARE 1. Patient evaluated today. 2. Instructed to maintain good pedal hygiene and foot care. Stressed importance of controlling blood sugar.  3. Mechanical debridement of nails 1-5 bilaterally performed using a nail nipper. Filed with dremel without incident.  4. Return to clinic in 3 mos.     [name], DPM [facility]  Dr. [name], [address]                                        [place], [place] [address]                Office [phone]  Fax [phone]

## 2017-09-28 ENCOUNTER — Ambulatory Visit: Payer: Medicare Other | Admitting: Primary Care

## 2017-09-29 ENCOUNTER — Ambulatory Visit (INDEPENDENT_AMBULATORY_CARE_PROVIDER_SITE_OTHER): Payer: Medicare Other | Admitting: Primary Care

## 2017-09-29 ENCOUNTER — Encounter: Payer: Self-pay | Admitting: Primary Care

## 2017-09-29 DIAGNOSIS — Z794 Long term (current) use of insulin: Secondary | ICD-10-CM | POA: Diagnosis not present

## 2017-09-29 DIAGNOSIS — I10 Essential (primary) hypertension: Secondary | ICD-10-CM | POA: Diagnosis not present

## 2017-09-29 DIAGNOSIS — I200 Unstable angina: Secondary | ICD-10-CM

## 2017-09-29 DIAGNOSIS — E119 Type 2 diabetes mellitus without complications: Secondary | ICD-10-CM

## 2017-09-29 DIAGNOSIS — E785 Hyperlipidemia, unspecified: Secondary | ICD-10-CM

## 2017-09-29 DIAGNOSIS — E118 Type 2 diabetes mellitus with unspecified complications: Secondary | ICD-10-CM

## 2017-09-29 DIAGNOSIS — M159 Polyosteoarthritis, unspecified: Secondary | ICD-10-CM | POA: Diagnosis not present

## 2017-09-29 DIAGNOSIS — E108 Type 1 diabetes mellitus with unspecified complications: Secondary | ICD-10-CM

## 2017-09-29 DIAGNOSIS — I25119 Atherosclerotic heart disease of native coronary artery with unspecified angina pectoris: Secondary | ICD-10-CM | POA: Diagnosis not present

## 2017-09-29 DIAGNOSIS — M1990 Unspecified osteoarthritis, unspecified site: Secondary | ICD-10-CM | POA: Insufficient documentation

## 2017-09-29 MED ORDER — INSULIN GLARGINE 100 UNIT/ML ~~LOC~~ SOLN: 25.0000 [IU] | Freq: Every day | SUBCUTANEOUS | 1 refills | Status: DC

## 2017-09-29 NOTE — Assessment & Plan Note (Signed)
Lipids from June 2018 unremarkable, repeat in June 2019. Continue rosuvastatin.

## 2017-09-29 NOTE — Assessment & Plan Note (Signed)
Blood sugars overall improved. Following with endocrinology, next visit tomorrow.

## 2017-09-29 NOTE — Assessment & Plan Note (Signed)
Mostly to right shoulder. Discussed to avoid NSAID's if possible, try tylenol arthritis. Consider PT in the future if no improvement.

## 2017-09-29 NOTE — Progress Notes (Signed)
Subjective:    Patient ID: Lawrence [name], male    DOB: 1950-11-06, 66 y.o.   MRN: [id number]  HPI  Lawrence [name] is a 66 year old male who presents today for follow up.  1) CAD/MI/Unstable Angina?CABG/Hyperlipidemia: Underwent cardiac catheterization in May 2018 due to NSTEMI. Underwent cardiac rehabilitation in late May 2018. Currently managed on clopidogrel, furosemide, isosorbide mononitrate, metoprolol tartrate, nitroglycerin (PRN), Ranexa, rosuvastatin. He is now involved in "fit for life" program through [facility]. Following with cardiology, last visit 09/14/17. Lipid panel in June 2018 with TC of 137, LDL of 61.  2) Type 1 Diabetes: Currently managed on Lantus 25 units HS and Novolog 15 units TID. Currently following with endocrinology through [facility]. A1C of 7.8 in June 2018. Due for follow up tomorrow.  He's checking his blood sugars 4 times daily on average. AM Fasting: mid 100's 2 hours after a meal: low to mid 100;s, some 200's. Bedtime: mid 100's-200's   3) Osteoarthritis: Chronic knee pain for years, replacement to left knee with relief. Now with right shoulder and right third digit hand pain, taking Aleve. Interested in CBD oil.   Review of Systems  Eyes: Negative for visual disturbance.  Respiratory: Negative for shortness of breath.   Cardiovascular: Negative for chest pain.  Musculoskeletal:       Chronic right shoulder pain.  Neurological: Negative for dizziness and headaches.       Past Medical History:  Diagnosis Date  . CAD (coronary artery disease)    07/2007  . Diabetes ([place])   . Elevated lipids   . Glaucoma   . HBP (high blood pressure)   . Neuropathy   . Osteoarthritis   . Skin cancer   . Sleep apnea    CPAP     Social History   Socioeconomic History  . Marital status: Married    Spouse name: Not on file  . Number of children: Not on file  . Years of education: Not on file  . Highest education level: Not on file  Social Needs  . Financial  resource strain: Not on file  . Food insecurity - worry: Not on file  . Food insecurity - inability: Not on file  . Transportation needs - medical: Not on file  . Transportation needs - non-medical: Not on file  Occupational History  . Not on file  Tobacco Use  . Smoking status: Former Smoker    Packs/day: 1.00    Years: 16.00    Pack years: 16.00    Types: Cigarettes    Start date: 11/03/1969    Last attempt to quit: 07/22/1986    Years since quitting: 31.2  . Smokeless tobacco: Never Used  Substance and Sexual Activity  . Alcohol use: Yes    Alcohol/week: 0.0 oz    Comment: OCCASIONALLY  . Drug use: No  . Sexual activity: No  Other Topics Concern  . Not on file  Social History Narrative   Married.  Lives at home with wife.     Past Surgical History:  Procedure Laterality Date  . APPENDECTOMY    . CARDIAC CATHETERIZATION N/A 06/11/2016   Procedure: Left Heart Cath and Coronary Angiography;  Surgeon: Corey Skains, MD;  Location: [place] [facility];  Service: Cardiovascular;  Laterality: N/A;  . CARPAL TUNNEL RELEASE Left 04/24/2016   Procedure: CARPAL TUNNEL RELEASE;  Surgeon: Hessie Knows, MD;  Location: [facility];  Service: Orthopedics;  Laterality: Left;  . CATARACT EXTRACTION W/ INTRAOCULAR  LENS  IMPLANT, BILATERAL Bilateral   . CATARACT EXTRACTION, BILATERAL    . CORONARY ARTERY BYPASS GRAFT N/A 06/27/2016   Procedure: CORONARY ARTERY BYPASS GRAFTING times four using left internal mammary artery and right leg saphenous vein;  Surgeon: Ivin Poot, MD;  Location: [place];  Service: Open Heart Surgery;  Laterality: N/A;  . EYE SURGERY Bilateral   . JOINT REPLACEMENT    . KNEE ARTHROPLASTY Left 02/11/2016   Procedure: COMPUTER ASSISTED TOTAL KNEE ARTHROPLASTY;  Surgeon: Dereck Leep, MD;  Location: [facility];  Service: Orthopedics;  Laterality: Left;  . KNEE ARTHROSCOPY Right   . LEFT HEART CATH AND CORS/GRAFTS ANGIOGRAPHY Left 01/13/2017   Procedure: Left Heart Cath  and Cors/Grafts Angiography;  Surgeon: Corey Skains, MD;  Location: [place] [facility];  Service: Cardiovascular;  Laterality: Left;  . TEE WITHOUT CARDIOVERSION N/A 06/27/2016   Procedure: TRANSESOPHAGEAL ECHOCARDIOGRAM (TEE);  Surgeon: Ivin Poot, MD;  Location: [place];  Service: Open Heart Surgery;  Laterality: N/A;    Family History  Problem Relation Age of Onset  . Drug abuse Brother   . Hypertension Mother   . Lupus Mother   . Hypertension Father   . Diabetes Father   . Heart attack Father 74       died in his 44s  . Arthritis Maternal Grandmother   . Arthritis Maternal Grandfather   . Arthritis Paternal Grandmother   . Arthritis Paternal Grandfather   . Heart disease Brother     Allergies  Allergen Reactions  . Nitroglycerin Nausea Only and Other (See Comments)    Patches only - headache Patches only - headache  . Statins Other (See Comments)    BODY PAIN  Other reaction(s): Muscle Pain BODY PAIN   . Lipitor [Atorvastatin] Other (See Comments) and Rash    BODY PAIN  . Lisinopril Other (See Comments) and Nausea Only    Light headed, dizzy Other reaction(s): Dizziness    Current Outpatient Medications on File Prior to Visit  Medication Sig Dispense Refill  . aspirin 81 MG EC tablet Take 81 mg by mouth daily. Swallow whole.    . cholecalciferol (VITAMIN D) 1000 units tablet Take 1,000 Units by mouth daily.    . clopidogrel (PLAVIX) 75 MG tablet Take 1 tablet (75 mg total) by mouth daily. 30 tablet 0  . cyanocobalamin 500 MCG tablet Take 500 mcg by mouth daily.    [name] docusate sodium (COLACE) 100 MG capsule Take 200 mg by mouth 2 (two) times daily.    . fexofenadine (ALLEGRA) 180 MG tablet Take 180 mg by mouth daily.    . furosemide (LASIX) 40 MG tablet Take 1 tablet (40 mg total) by mouth daily. 30 tablet 1  . glucagon (GLUCAGON EMERGENCY) 1 MG injection Inject 1 mg into the vein once as needed. Reported on 02/11/2016    . insulin aspart (NOVOLOG) 100  UNIT/ML injection Inject 20 Units into the skin 3 (three) times daily. (Patient taking differently: Inject 20 Units into the skin 3 (three) times daily with meals. ) 10 mL 5  . isosorbide mononitrate (IMDUR) 30 MG 24 hr tablet Take 30 mg by mouth daily.    [name] latanoprost (XALATAN) 0.005 % ophthalmic solution Place 1 drop into both eyes at bedtime.    . metoprolol tartrate (LOPRESSOR) 25 MG tablet Take 25 mg by mouth 2 (two) times daily.    . Multiple Vitamin (MULTIVITAMIN) capsule Take 1 capsule by mouth daily.    [name]  nitroGLYCERIN (NITROSTAT) 0.4 MG SL tablet Place 1 tablet (0.4 mg total) every 5 (five) minutes as needed under the tongue for chest pain. 50 tablet 1  . potassium chloride SA (K-DUR,KLOR-CON) 20 MEQ tablet Take 1 tablet (20 mEq total) by mouth daily. 30 tablet 1  . ranolazine (RANEXA) 500 MG 12 hr tablet Take 1 tablet (500 mg total) by mouth 2 (two) times daily. 60 tablet 0  . rosuvastatin (CRESTOR) 20 MG tablet Take 1 tablet (20 mg total) by mouth daily at 6 PM. 30 tablet 1  . Turmeric 500 MG CAPS Take 1 tablet by mouth daily.    . vitamin C (ASCORBIC ACID) 500 MG tablet Take 500 mg by mouth 2 (two) times daily.      No current facility-administered medications on file prior to visit.     BP 103/66   Pulse 78   Temp 98.3 F (36.8 C) (Oral)   Wt 298 lb 1.9 oz (135.2 kg)   SpO2 97%   BMI 41.81 kg/m    Objective:   Physical Exam  Constitutional: He is oriented to person, place, and time. He appears well-nourished.  Neck: Neck supple.  Cardiovascular: Normal rate and regular rhythm.  Pulmonary/Chest: Effort normal and breath sounds normal.  Neurological: He is alert and oriented to person, place, and time.  Skin: Skin is warm and dry.          Assessment & Plan:

## 2017-09-29 NOTE — Assessment & Plan Note (Signed)
Stable in the office today, continue current regimen. 

## 2017-09-29 NOTE — Assessment & Plan Note (Signed)
NSTEMI in May 2018, completed cardiac rehab, now involved with "fit for life" program through [facility]. Denies chest pain. Exam today unremarkable. Repeat lipids in June 2019. Continue current regimen.

## 2017-09-29 NOTE — Patient Instructions (Signed)
Schedule a follow up visit in 6 months. We will repeat your labs in 6 months, come fasting.   It was a pleasure to see you today!

## 2017-10-16 ENCOUNTER — Ambulatory Visit (INDEPENDENT_AMBULATORY_CARE_PROVIDER_SITE_OTHER): Payer: Medicare Other | Admitting: Podiatry

## 2017-10-16 ENCOUNTER — Encounter: Payer: Self-pay | Admitting: Podiatry

## 2017-10-16 DIAGNOSIS — Q828 Other specified congenital malformations of skin: Secondary | ICD-10-CM | POA: Diagnosis not present

## 2017-10-16 DIAGNOSIS — L989 Disorder of the skin and subcutaneous tissue, unspecified: Secondary | ICD-10-CM | POA: Diagnosis not present

## 2017-10-16 DIAGNOSIS — E0842 Diabetes mellitus due to underlying condition with diabetic polyneuropathy: Secondary | ICD-10-CM

## 2017-10-16 MED ORDER — GENTAMICIN SULFATE 0.1 % EX CREA: 1.0000 "application " | TOPICAL_CREAM | Freq: Three times a day (TID) | CUTANEOUS | 1 refills | Status: DC

## 2017-10-19 NOTE — Progress Notes (Signed)
   Subjective: Patient presents to the office today for chief complaint of a possible ingrown nail of the right great toe that has been painful for the past 2-3 weeks. He reports associated malodor from the toe. He is concerned for infection. Touching the area increases the pain. He denies alleviating factors. Patient is here for further evaluation and treatment.   Past Medical History:  Diagnosis Date  . CAD (coronary artery disease)    07/2007  . Diabetes ([place])   . Elevated lipids   . Glaucoma   . HBP (high blood pressure)   . Neuropathy   . Osteoarthritis   . Skin cancer   . Sleep apnea    CPAP     Objective:  Physical Exam General: Alert and oriented x3 in no acute distress  Dermatology: Hyperkeratotic lesion present on the right great toe. Pain on palpation with a central nucleated core noted.  Skin is warm, dry and supple bilateral lower extremities. Negative for open lesions or macerations.  Vascular: Palpable pedal pulses bilaterally. No edema or erythema noted. Capillary refill within normal limits.  Neurological: Epicritic and protective threshold grossly intact bilaterally.   Musculoskeletal Exam: Pain on palpation at the keratotic lesion noted. Range of motion within normal limits bilateral. Muscle strength 5/5 in all groups bilateral.  Assessment: #1 Pre-ulcerative callus right great toe   Plan of Care:  #1 Patient evaluated #2 Excisional debridement of keratoic lesion using a chisel blade was performed without incident.  #3 Dressed area with light dressing. #4 Prescription for Gentamicin cream to be used daily with a Band-Aid. #5 Continue wearing DM shoes.  #6 Patient is to return to the clinic in 3 weeks.   [name], DPM [facility]  Dr. [name], [address]                                        [place], [place] [address]                Office [phone]  Fax [phone]

## 2017-11-06 ENCOUNTER — Ambulatory Visit (INDEPENDENT_AMBULATORY_CARE_PROVIDER_SITE_OTHER): Payer: Medicare Other | Admitting: Podiatry

## 2017-11-06 ENCOUNTER — Encounter: Payer: Self-pay | Admitting: Podiatry

## 2017-11-06 DIAGNOSIS — E0842 Diabetes mellitus due to underlying condition with diabetic polyneuropathy: Secondary | ICD-10-CM

## 2017-11-06 DIAGNOSIS — L97512 Non-pressure chronic ulcer of other part of right foot with fat layer exposed: Secondary | ICD-10-CM

## 2017-11-06 DIAGNOSIS — I70235 Atherosclerosis of native arteries of right leg with ulceration of other part of foot: Secondary | ICD-10-CM

## 2017-11-08 NOTE — Progress Notes (Signed)
   Subjective:  Patient with a history of diabetes mellitus presents today for follow up evaluation of an ulceration to the right great toe. He has been applying gentamycin cream to the area with minimal relief. Patient presents today for further treatment and evaluation.   Past Medical History:  Diagnosis Date  . CAD (coronary artery disease)    07/2007  . Diabetes ([place])   . Elevated lipids   . Glaucoma   . HBP (high blood pressure)   . Neuropathy   . Osteoarthritis   . Skin cancer   . Sleep apnea    CPAP      Objective/Physical Exam General: The patient is alert and oriented x3 in no acute distress.  Dermatology:  Wound #1 noted to the right great toe measuring 2.0 x 2.5 x 0.1 cm (LxWxD).   To the noted ulceration(s), there is no eschar. There is a moderate amount of slough, fibrin, and necrotic tissue noted. Granulation tissue and wound base is red. There is a minimal amount of serosanguineous drainage noted. There is no exposed bone muscle-tendon ligament or joint. There is no malodor. Periwound integrity is intact. Skin is warm, dry and supple bilateral lower extremities.  Vascular: Palpable pedal pulses bilaterally. No edema or erythema noted. Capillary refill within normal limits.  Neurological: Epicritic and protective threshold absent bilaterally.   Musculoskeletal Exam: Range of motion within normal limits to all pedal and ankle joints bilateral. Muscle strength 5/5 in all groups bilateral.   Assessment: #1  Ulceration of the right great toe secondary to diabetes mellitus #2 diabetes mellitus w/ peripheral neuropathy   Plan of Care:  #1 Patient was evaluated. #2 medically necessary excisional debridement including subcutaneous tissue was performed using a tissue nipper and a chisel blade. Excisional debridement of all the necrotic nonviable tissue down to healthy bleeding viable tissue was performed with post-debridement measurements same as pre-. #3 the wound  was cleansed and dry sterile dressing applied. #4 Continue using gentamicin cream with a Band-Aid daily. #5 Return to clinic in 2 weeks.   [name], DPM [facility]  Dr. [name], [address]                                        [place], [place] [address]                Office [phone]  Fax [phone]

## 2017-11-24 ENCOUNTER — Ambulatory Visit (INDEPENDENT_AMBULATORY_CARE_PROVIDER_SITE_OTHER): Payer: Medicare Other | Admitting: Podiatry

## 2017-11-24 ENCOUNTER — Encounter: Payer: Self-pay | Admitting: Podiatry

## 2017-11-24 ENCOUNTER — Ambulatory Visit (INDEPENDENT_AMBULATORY_CARE_PROVIDER_SITE_OTHER): Payer: Medicare Other

## 2017-11-24 VITALS — HR 98

## 2017-11-24 DIAGNOSIS — L97512 Non-pressure chronic ulcer of other part of right foot with fat layer exposed: Secondary | ICD-10-CM

## 2017-11-24 DIAGNOSIS — I70235 Atherosclerosis of native arteries of right leg with ulceration of other part of foot: Secondary | ICD-10-CM | POA: Diagnosis not present

## 2017-11-24 DIAGNOSIS — L03115 Cellulitis of right lower limb: Secondary | ICD-10-CM

## 2017-11-24 DIAGNOSIS — E0842 Diabetes mellitus due to underlying condition with diabetic polyneuropathy: Secondary | ICD-10-CM | POA: Diagnosis not present

## 2017-11-24 MED ORDER — SULFAMETHOXAZOLE-TRIMETHOPRIM 800-160 MG PO TABS: 1.0000 | ORAL_TABLET | Freq: Two times a day (BID) | ORAL | 0 refills | Status: DC

## 2017-11-25 LAB — CBC WITH DIFFERENTIAL/PLATELET
Basophils Absolute: 0 10*3/uL (ref 0.0–0.2)
Basos: 0 %
EOS (ABSOLUTE): 0.2 10*3/uL (ref 0.0–0.4)
EOS: 2 %
HEMATOCRIT: 39.5 % (ref 37.5–51.0)
HEMOGLOBIN: 13.1 g/dL (ref 13.0–17.7)
IMMATURE GRANS (ABS): 0 10*3/uL (ref 0.0–0.1)
IMMATURE GRANULOCYTES: 0 %
Lymphocytes Absolute: 1.9 10*3/uL (ref 0.7–3.1)
Lymphs: 27 %
MCH: 31.4 pg (ref 26.6–33.0)
MCHC: 33.2 g/dL (ref 31.5–35.7)
MCV: 95 fL (ref 79–97)
Monocytes Absolute: 0.5 10*3/uL (ref 0.1–0.9)
Monocytes: 7 %
NEUTROS PCT: 64 %
Neutrophils Absolute: 4.4 10*3/uL (ref 1.4–7.0)
Platelets: 256 10*3/uL (ref 150–379)
RBC: 4.17 x10E6/uL (ref 4.14–5.80)
RDW: 13.2 % (ref 12.3–15.4)
WBC: 6.8 10*3/uL (ref 3.4–10.8)

## 2017-11-25 NOTE — Progress Notes (Signed)
Please contact patient and let him know his blood work was normal.  Thanks, Dr. Amalia Hailey

## 2017-11-26 ENCOUNTER — Telehealth: Payer: Self-pay

## 2017-11-26 NOTE — Telephone Encounter (Signed)
I spoke with patient and informed him that his labs were normal.  He states that his foot is looking better and the swelling has gone down.  I educated him on s/s of infection and he will call if his foot starts to look worse.

## 2017-11-26 NOTE — Telephone Encounter (Signed)
-----   Message from [name], DPM sent at 11/25/2017 10:19 AM EST ----- Please contact patient and let him know his blood work was normal.  Thanks, Dr. Amalia Hailey

## 2017-11-28 NOTE — Progress Notes (Signed)
   Subjective:  Patient with a history of diabetes mellitus presents today for follow up evaluation of an ulceration to the right great toe. He states the wound has started looking worse over the past week. He reports associated stabbing pain and swelling of the foot. He is also concerned for calluses to the right 2nd and left 3rd toes. Patient presents today for further treatment and evaluation.   Past Medical History:  Diagnosis Date  . CAD (coronary artery disease)    07/2007  . Diabetes ([place])   . Elevated lipids   . Glaucoma   . HBP (high blood pressure)   . Neuropathy   . Osteoarthritis   . Skin cancer   . Sleep apnea    CPAP      Objective/Physical Exam General: The patient is alert and oriented x3 in no acute distress.  Dermatology:  Wound #1 noted to the right great toe measuring 0.5 x 1.5 x 0.2 cm (LxWxD).   Mild edema with erythema noted to right foot and leg concerning for cellulitis.   Vascular: Palpable pedal pulses bilaterally. No edema or erythema noted. Capillary refill within normal limits.  Neurological: Epicritic and protective threshold absent bilaterally.   Musculoskeletal Exam: Range of motion within normal limits to all pedal and ankle joints bilateral. Muscle strength 5/5 in all groups bilateral.   Radiographic Exam:  Normal osseous mineralization. Joint spaces preserved. No fracture/dislocation/boney destruction.    Assessment: #1 Ulceration of the right great toe secondary to diabetes mellitus #2 Cellulitis right foot and leg   Plan of Care:  #1 Patient was evaluated. #2 medically necessary excisional debridement including subcutaneous tissue was performed using a tissue nipper and a chisel blade. Excisional debridement of all the necrotic nonviable tissue down to healthy bleeding viable tissue was performed with post-debridement measurements same as pre-. #3 the wound was cleansed and dry sterile dressing applied. #4 Continue using gentamicin  cream with dry sterile dressing daily. #5 Patient temperature is 97.8 degrees F. Afebrile. Orders placed to immediately go to [employer] [facility] for CBC. #6 Continue weightbearing in post op shoe. #7 Prescription for Bactrim DS provided to patient. #8 Return to clinic in 2 weeks.    [name], DPM [facility]  Dr. [name], [address]                                        [place], [place] [address]                Office [phone]  Fax [phone]

## 2017-12-04 ENCOUNTER — Ambulatory Visit: Payer: Medicare Other | Admitting: Podiatry

## 2017-12-08 ENCOUNTER — Encounter: Payer: Self-pay | Admitting: Podiatry

## 2017-12-08 ENCOUNTER — Ambulatory Visit (INDEPENDENT_AMBULATORY_CARE_PROVIDER_SITE_OTHER): Payer: Medicare Other | Admitting: Podiatry

## 2017-12-08 DIAGNOSIS — E0842 Diabetes mellitus due to underlying condition with diabetic polyneuropathy: Secondary | ICD-10-CM

## 2017-12-08 DIAGNOSIS — L97512 Non-pressure chronic ulcer of other part of right foot with fat layer exposed: Secondary | ICD-10-CM

## 2017-12-08 DIAGNOSIS — I70235 Atherosclerosis of native arteries of right leg with ulceration of other part of foot: Secondary | ICD-10-CM

## 2017-12-10 NOTE — Progress Notes (Signed)
   Subjective:  67 year old male presents today for follow up evaluation of an ulceration to the right great toe. He states the area looks slightly improved. He states there is still a red streak on the toe. He states he has finished his course of Bactrim DS. Patient is here for further evaluation and treatment.   Past Medical History:  Diagnosis Date  . CAD (coronary artery disease)    07/2007  . Diabetes ([place])   . Elevated lipids   . Glaucoma   . HBP (high blood pressure)   . Neuropathy   . Osteoarthritis   . Skin cancer   . Sleep apnea    CPAP      Objective/Physical Exam General: The patient is alert and oriented x3 in no acute distress.  Dermatology:  Wound #1 noted to the right great toe measuring 0.7 x 1.3 x 0.1 cm (LxWxD).   To the noted ulceration(s), there is no eschar. There is a moderate amount of slough, fibrin, and necrotic tissue noted. Granulation tissue and wound base is red. There is a minimal amount of serosanguineous drainage noted. There is no exposed bone muscle-tendon ligament or joint. There is no malodor. Periwound integrity is intact. Skin is warm, dry and supple bilateral lower extremities.  Vascular: Palpable pedal pulses bilaterally. No edema or erythema noted. Capillary refill within normal limits.  Neurological: Epicritic and protective threshold absent bilaterally.   Musculoskeletal Exam: Range of motion within normal limits to all pedal and ankle joints bilateral. Muscle strength 5/5 in all groups bilateral.   Assessment: #1 ulceration of the right great toe secondary to diabetes mellitus #2 diabetes mellitus w/ peripheral neuropathy   Plan of Care:  #1 Patient was evaluated. #2 medically necessary excisional debridement including subcutaneous tissue was performed using a tissue nipper and a chisel blade. Excisional debridement of all the necrotic nonviable tissue down to healthy bleeding viable tissue was performed with post-debridement  measurements same as pre-. #3 the wound was cleansed and dry sterile dressing applied. #4 Continue using gentamicin cream daily with a dry sterile dressing.  #5 Continue wearing post op shoe. #6 Return to clinic at next scheduled appointment on 12/25/17.   [name], DPM [facility]  Dr. [name], [address]                                        [place], [place] [address]                Office [phone]  Fax [phone]

## 2017-12-15 ENCOUNTER — Ambulatory Visit (INDEPENDENT_AMBULATORY_CARE_PROVIDER_SITE_OTHER): Payer: Medicare Other | Admitting: Podiatry

## 2017-12-15 ENCOUNTER — Encounter: Payer: Self-pay | Admitting: Podiatry

## 2017-12-15 DIAGNOSIS — L97512 Non-pressure chronic ulcer of other part of right foot with fat layer exposed: Secondary | ICD-10-CM

## 2017-12-15 DIAGNOSIS — I70235 Atherosclerosis of native arteries of right leg with ulceration of other part of foot: Secondary | ICD-10-CM

## 2017-12-15 DIAGNOSIS — E0842 Diabetes mellitus due to underlying condition with diabetic polyneuropathy: Secondary | ICD-10-CM

## 2017-12-15 MED ORDER — SULFAMETHOXAZOLE-TRIMETHOPRIM 800-160 MG PO TABS: 1.0000 | ORAL_TABLET | Freq: Two times a day (BID) | ORAL | 0 refills | Status: DC

## 2017-12-17 LAB — WOUND CULTURE: Organism ID, Bacteria: NONE SEEN

## 2017-12-17 NOTE — Progress Notes (Signed)
   Subjective:  67 year old male presents today for follow up evaluation of an ulceration to the right great toe. He reports some swelling of the toe that began this morning. He states he completed his course of antibiotics last week. Patient is here for further evaluation and treatment.   Past Medical History:  Diagnosis Date  . CAD (coronary artery disease)    07/2007  . Diabetes ([place])   . Elevated lipids   . Glaucoma   . HBP (high blood pressure)   . Neuropathy   . Osteoarthritis   . Skin cancer   . Sleep apnea    CPAP      Objective/Physical Exam General: The patient is alert and oriented x3 in no acute distress.  Dermatology:  Wound #1 noted to the right great toe measuring 0.8 x 1.5 x 0.2 cm (LxWxD).   To the noted ulceration(s), there is no eschar. There is a moderate amount of slough, fibrin, and necrotic tissue noted. Granulation tissue and wound base is red. There is a minimal amount of serosanguineous drainage noted. There is no exposed bone muscle-tendon ligament or joint. There is no malodor. Periwound integrity is intact. Skin is warm, dry and supple bilateral lower extremities.  Vascular: Mild erythema and edema localized to the right great toe. Palpable pedal pulses bilaterally. Capillary refill within normal limits.  Neurological: Epicritic and protective threshold absent bilaterally.   Musculoskeletal Exam: Range of motion within normal limits to all pedal and ankle joints bilateral. Muscle strength 5/5 in all groups bilateral.   Assessment: #1 ulceration of the right great toe secondary to diabetes mellitus #2 diabetes mellitus w/ peripheral neuropathy   Plan of Care:  #1 Patient was evaluated. #2 medically necessary excisional debridement including subcutaneous tissue was performed using a tissue nipper and a chisel blade. Excisional debridement of all the necrotic nonviable tissue down to healthy bleeding viable tissue was performed with  post-debridement measurements same as pre-. #3 the wound was cleansed and dry sterile dressing applied. #4 Continue using gentamicin cream daily with a Band-Aid.  #5 Continue wearing post op shoe. #6 Refill prescription for Bactrim DS provided to patient.  #7 Return to clinic in 2 weeks.   [name], DPM [facility]  Dr. [name], [address]                                        [place], [place] [address]                Office [phone]  Fax [phone]

## 2017-12-25 ENCOUNTER — Ambulatory Visit: Payer: Medicare Other | Admitting: Podiatry

## 2017-12-29 ENCOUNTER — Ambulatory Visit: Payer: Medicare Other | Admitting: Podiatry

## 2017-12-29 ENCOUNTER — Ambulatory Visit (INDEPENDENT_AMBULATORY_CARE_PROVIDER_SITE_OTHER): Payer: Medicare Other | Admitting: Podiatry

## 2017-12-29 ENCOUNTER — Encounter: Payer: Self-pay | Admitting: Podiatry

## 2017-12-29 VITALS — BP 128/71 | HR 67 | Temp 97.1°F

## 2017-12-29 DIAGNOSIS — L97512 Non-pressure chronic ulcer of other part of right foot with fat layer exposed: Secondary | ICD-10-CM

## 2017-12-29 DIAGNOSIS — E0842 Diabetes mellitus due to underlying condition with diabetic polyneuropathy: Secondary | ICD-10-CM

## 2017-12-29 DIAGNOSIS — I70235 Atherosclerosis of native arteries of right leg with ulceration of other part of foot: Secondary | ICD-10-CM

## 2017-12-31 NOTE — Progress Notes (Signed)
   Subjective:  67 year old male presents today for follow up evaluation of an ulceration to the right great toe. He reports some swelling of the right foot. He reports associated dry skin around the great toe. Patient is here for further evaluation and treatment.   Past Medical History:  Diagnosis Date  . CAD (coronary artery disease)    07/2007  . Diabetes ([place])   . Elevated lipids   . Glaucoma   . HBP (high blood pressure)   . Neuropathy   . Osteoarthritis   . Skin cancer   . Sleep apnea    CPAP      Objective/Physical Exam General: The patient is alert and oriented x3 in no acute distress.  Dermatology:  Wound #1 noted to the right great toe measuring 0.6 x 1.0 x 0.1 cm (LxWxD).   To the noted ulceration(s), there is no eschar. There is a moderate amount of slough, fibrin, and necrotic tissue noted. Granulation tissue and wound base is red. There is a minimal amount of serosanguineous drainage noted. There is no exposed bone muscle-tendon ligament or joint. There is no malodor. Periwound integrity is intact. Skin is warm, dry and supple bilateral lower extremities.  Vascular: Mild erythema and edema localized to the right great toe. Palpable pedal pulses bilaterally. Capillary refill within normal limits.  Neurological: Epicritic and protective threshold absent bilaterally.   Musculoskeletal Exam: Range of motion within normal limits to all pedal and ankle joints bilateral. Muscle strength 5/5 in all groups bilateral.   Assessment: #1 ulceration of the right great toe secondary to diabetes mellitus #2 diabetes mellitus w/ peripheral neuropathy   Plan of Care:  #1 Patient was evaluated. #2 medically necessary excisional debridement including subcutaneous tissue was performed using a tissue nipper and a chisel blade. Excisional debridement of all the necrotic nonviable tissue down to healthy bleeding viable tissue was performed with post-debridement measurements same as  pre-. #3 the wound was cleansed and dry sterile dressing applied. #4 Continue using gentamicin cream daily with a Band-Aid.  #5 Continue wearing post op shoe. #6 Return to clinic in 4 weeks.   [name], DPM [facility]  Dr. [name], [address]                                        [place], [place] [address]                Office [phone]  Fax [phone]

## 2018-01-11 DIAGNOSIS — I10 Essential (primary) hypertension: Secondary | ICD-10-CM | POA: Diagnosis not present

## 2018-01-11 DIAGNOSIS — I25700 Atherosclerosis of coronary artery bypass graft(s), unspecified, with unstable angina pectoris: Secondary | ICD-10-CM | POA: Diagnosis not present

## 2018-01-11 DIAGNOSIS — R079 Chest pain, unspecified: Secondary | ICD-10-CM | POA: Diagnosis not present

## 2018-01-11 DIAGNOSIS — E782 Mixed hyperlipidemia: Secondary | ICD-10-CM | POA: Diagnosis not present

## 2018-01-11 DIAGNOSIS — I6523 Occlusion and stenosis of bilateral carotid arteries: Secondary | ICD-10-CM | POA: Diagnosis not present

## 2018-01-26 ENCOUNTER — Ambulatory Visit (INDEPENDENT_AMBULATORY_CARE_PROVIDER_SITE_OTHER): Payer: Medicare Other | Admitting: Podiatry

## 2018-01-26 ENCOUNTER — Encounter: Payer: Self-pay | Admitting: Podiatry

## 2018-01-26 DIAGNOSIS — E0842 Diabetes mellitus due to underlying condition with diabetic polyneuropathy: Secondary | ICD-10-CM | POA: Diagnosis not present

## 2018-01-26 DIAGNOSIS — I70235 Atherosclerosis of native arteries of right leg with ulceration of other part of foot: Secondary | ICD-10-CM

## 2018-01-26 DIAGNOSIS — L97512 Non-pressure chronic ulcer of other part of right foot with fat layer exposed: Secondary | ICD-10-CM | POA: Diagnosis not present

## 2018-01-26 MED ORDER — GENTAMICIN SULFATE 0.1 % EX CREA: 1.0000 "application " | TOPICAL_CREAM | Freq: Three times a day (TID) | CUTANEOUS | 1 refills | Status: DC

## 2018-01-27 ENCOUNTER — Telehealth: Payer: Self-pay | Admitting: Podiatry

## 2018-01-27 MED ORDER — GENTAMICIN SULFATE 0.1 % EX CREA: 1.0000 "application " | TOPICAL_CREAM | Freq: Three times a day (TID) | CUTANEOUS | 1 refills | Status: DC

## 2018-01-27 NOTE — Telephone Encounter (Signed)
Patient called was seen here yesterday, wants to know where Dr,. Evans sent his RX for the cream that was ordered?  His local pharmacy did not get and RX and he wanted to let us know that he had not picked it up yet.  Office notes were not updated yet, Per Angie wasn't sure if this was a [employer] cream as a compound or not. Please advise.

## 2018-01-28 NOTE — Progress Notes (Signed)
   Subjective:  67 year old male presents today for follow up evaluation of an ulceration to the right great toe. He states the wound looks improved but he is still experiencing some intermittent throbbing of the toe. He has been applying gentamicin cream daily with a Band-Aid as directed. Patient is here for further evaluation and treatment.   Past Medical History:  Diagnosis Date  . CAD (coronary artery disease)    07/2007  . Diabetes ([place])   . Elevated lipids   . Glaucoma   . HBP (high blood pressure)   . Neuropathy   . Osteoarthritis   . Skin cancer   . Sleep apnea    CPAP      Objective/Physical Exam General: The patient is alert and oriented x3 in no acute distress.  Dermatology:  Wound #1 noted to the right great toe measuring 0.4 x 0.8 x 0.1 cm (LxWxD).   To the noted ulceration(s), there is no eschar. There is a moderate amount of slough, fibrin, and necrotic tissue noted. Granulation tissue and wound base is red. There is a minimal amount of serosanguineous drainage noted. There is no exposed bone muscle-tendon ligament or joint. There is no malodor. Periwound integrity is intact. Skin is warm, dry and supple bilateral lower extremities.  Vascular: Mild erythema and edema localized to the right great toe. Palpable pedal pulses bilaterally. Capillary refill within normal limits.  Neurological: Epicritic and protective threshold absent bilaterally.   Musculoskeletal Exam: Range of motion within normal limits to all pedal and ankle joints bilateral. Muscle strength 5/5 in all groups bilateral.   Assessment: #1 ulceration of the right great toe secondary to diabetes mellitus - improved #2 diabetes mellitus w/ peripheral neuropathy   Plan of Care:  #1 Patient was evaluated. #2 medically necessary excisional debridement including subcutaneous tissue was performed using a tissue nipper and a chisel blade. Excisional debridement of all the necrotic nonviable tissue down  to healthy bleeding viable tissue was performed with post-debridement measurements same as pre-. #3 the wound was cleansed and dry sterile dressing applied. #4 Continue using gentamicin cream daily with a Band-Aid.  #5 Refill prescription for gentamicin cream provided for patient.  #6 Return to clinic in 3 weeks.   [name], DPM [facility]  Dr. [name], [place]                                        [place], [place] [address]                Office [phone]  Fax [phone]

## 2018-02-08 DIAGNOSIS — R42 Dizziness and giddiness: Secondary | ICD-10-CM | POA: Diagnosis not present

## 2018-02-08 DIAGNOSIS — I6523 Occlusion and stenosis of bilateral carotid arteries: Secondary | ICD-10-CM | POA: Diagnosis not present

## 2018-02-08 DIAGNOSIS — I25708 Atherosclerosis of coronary artery bypass graft(s), unspecified, with other forms of angina pectoris: Secondary | ICD-10-CM | POA: Diagnosis not present

## 2018-02-08 DIAGNOSIS — Z6841 Body Mass Index (BMI) 40.0 and over, adult: Secondary | ICD-10-CM | POA: Diagnosis not present

## 2018-02-16 ENCOUNTER — Encounter: Payer: Self-pay | Admitting: Podiatry

## 2018-02-16 ENCOUNTER — Ambulatory Visit (INDEPENDENT_AMBULATORY_CARE_PROVIDER_SITE_OTHER): Payer: Medicare Other | Admitting: Podiatry

## 2018-02-16 DIAGNOSIS — I70235 Atherosclerosis of native arteries of right leg with ulceration of other part of foot: Secondary | ICD-10-CM

## 2018-02-16 DIAGNOSIS — L97512 Non-pressure chronic ulcer of other part of right foot with fat layer exposed: Secondary | ICD-10-CM | POA: Diagnosis not present

## 2018-02-16 DIAGNOSIS — E0842 Diabetes mellitus due to underlying condition with diabetic polyneuropathy: Secondary | ICD-10-CM

## 2018-02-16 MED ORDER — GENTAMICIN SULFATE 0.1 % EX CREA: 1.0000 "application " | TOPICAL_CREAM | Freq: Three times a day (TID) | CUTANEOUS | 1 refills | Status: DC

## 2018-02-18 DIAGNOSIS — Z96652 Presence of left artificial knee joint: Secondary | ICD-10-CM | POA: Diagnosis not present

## 2018-02-19 NOTE — Progress Notes (Signed)
   Subjective:  67 year old male presents today for follow up evaluation of an ulceration to the right great toe. He states the ulcer is doing well. He has been applying the gentamicin cream daily with a dry sterile dressing. He denies any new complaints. Patient is here for further evaluation and treatment.   Past Medical History:  Diagnosis Date  . CAD (coronary artery disease)    07/2007  . Diabetes ([place])   . Elevated lipids   . Glaucoma   . HBP (high blood pressure)   . Neuropathy   . Osteoarthritis   . Skin cancer   . Sleep apnea    CPAP      Objective/Physical Exam General: The patient is alert and oriented x3 in no acute distress.  Dermatology:  Wound #1 noted to the right great toe measuring 0.4 x 0.5 x 0.1 cm (LxWxD).   To the noted ulceration(s), there is no eschar. There is a moderate amount of slough, fibrin, and necrotic tissue noted. Granulation tissue and wound base is red. There is a minimal amount of serosanguineous drainage noted. There is no exposed bone muscle-tendon ligament or joint. There is no malodor. Periwound integrity is intact. Skin is warm, dry and supple bilateral lower extremities.  Vascular: Mild erythema and edema localized to the right great toe. Palpable pedal pulses bilaterally. Capillary refill within normal limits.  Neurological: Epicritic and protective threshold absent bilaterally.   Musculoskeletal Exam: Range of motion within normal limits to all pedal and ankle joints bilateral. Muscle strength 5/5 in all groups bilateral.   Assessment: #1 ulceration of the right great toe secondary to diabetes mellitus  #2 diabetes mellitus w/ peripheral neuropathy   Plan of Care:  #1 Patient was evaluated. #2 medically necessary excisional debridement including subcutaneous tissue was performed using a tissue nipper and a chisel blade. Excisional debridement of all the necrotic nonviable tissue down to healthy bleeding viable tissue was  performed with post-debridement measurements same as pre-. #3 the wound was cleansed and dry sterile dressing applied. #4 Refill prescription for gentantamicin cream provided for patient.  #5 Continue using gentamicin cream daily with a dry sterile dressing.  #6 Continue wearing DM shoes.  #7 Return to clinic in 3 weeks.   [name], DPM [facility]  Dr. [name], [place]                                        [place], [place] [address]                Office [phone]  Fax [phone]

## 2018-03-09 ENCOUNTER — Encounter: Payer: Self-pay | Admitting: Podiatry

## 2018-03-09 ENCOUNTER — Ambulatory Visit (INDEPENDENT_AMBULATORY_CARE_PROVIDER_SITE_OTHER): Payer: Medicare Other | Admitting: Podiatry

## 2018-03-09 DIAGNOSIS — E0842 Diabetes mellitus due to underlying condition with diabetic polyneuropathy: Secondary | ICD-10-CM | POA: Diagnosis not present

## 2018-03-09 DIAGNOSIS — L97512 Non-pressure chronic ulcer of other part of right foot with fat layer exposed: Secondary | ICD-10-CM | POA: Diagnosis not present

## 2018-03-09 DIAGNOSIS — B351 Tinea unguium: Secondary | ICD-10-CM

## 2018-03-09 DIAGNOSIS — M79676 Pain in unspecified toe(s): Secondary | ICD-10-CM

## 2018-03-11 NOTE — Progress Notes (Signed)
   SUBJECTIVE 67 year old male with PMHx of DM presenting today for follow up evaluation of an ulceration of the right great toe. He states the wound has improved significantly. He has been applying gentamicin cream and wearing his DM shoes as directed.  He also complains of elongated, thickened nails that cause pain while ambulating in shoes. He is unable to trim his own nails. Patient is here for further evaluation and treatment.   Past Medical History:  Diagnosis Date  . CAD (coronary artery disease)    07/2007  . Diabetes ([place])   . Elevated lipids   . Glaucoma   . HBP (high blood pressure)   . Neuropathy   . Osteoarthritis   . Skin cancer   . Sleep apnea    CPAP    OBJECTIVE General Patient is awake, alert, and oriented x 3 and in no acute distress. Derm Skin is dry and supple bilateral. Negative open lesions or macerations. Remaining integument unremarkable. Nails are tender, long, thickened and dystrophic with subungual debris, consistent with onychomycosis, 1-5 bilateral. No signs of infection noted. Vasc  DP and PT pedal pulses palpable bilaterally. Temperature gradient within normal limits.  Neuro Epicritic and protective threshold sensation diminished bilaterally.  Musculoskeletal Exam No symptomatic pedal deformities noted bilateral. Muscular strength within normal limits.  ASSESSMENT 1. Diabetes Mellitus w/ peripheral neuropathy 2. Onychomycosis of nail due to dermatophyte bilateral 3. Ulceration of the right great toe secondary to diabetes mellitus - resolved  PLAN OF CARE 1. Patient evaluated today. 2. Instructed to maintain good pedal hygiene and foot care. Stressed importance of controlling blood sugar.  3. Mechanical debridement of nails 1-5 bilaterally performed using a nail nipper. Filed with dremel without incident.  4. Continue wearing DM shoes.  5. Return to clinic in 3 mos.     [name], DPM [facility]  Dr. [name], [place]                                        [place],  [address]                Office [phone]  Fax [phone]

## 2018-04-02 ENCOUNTER — Ambulatory Visit: Payer: Medicare Other

## 2018-04-05 ENCOUNTER — Ambulatory Visit: Payer: Medicare Other

## 2018-04-07 ENCOUNTER — Encounter: Payer: Medicare Other | Admitting: Primary Care

## 2018-04-09 IMAGING — CR DG CHEST 2V
2 series · 2 of 2 positions shown · non-contrast
Comparison: Prior chest x-ray 03/17/2017

CLINICAL DATA: 66-year-old male with left-sided chest pain

EXAM:
CHEST  2 VIEW

[chest pa]
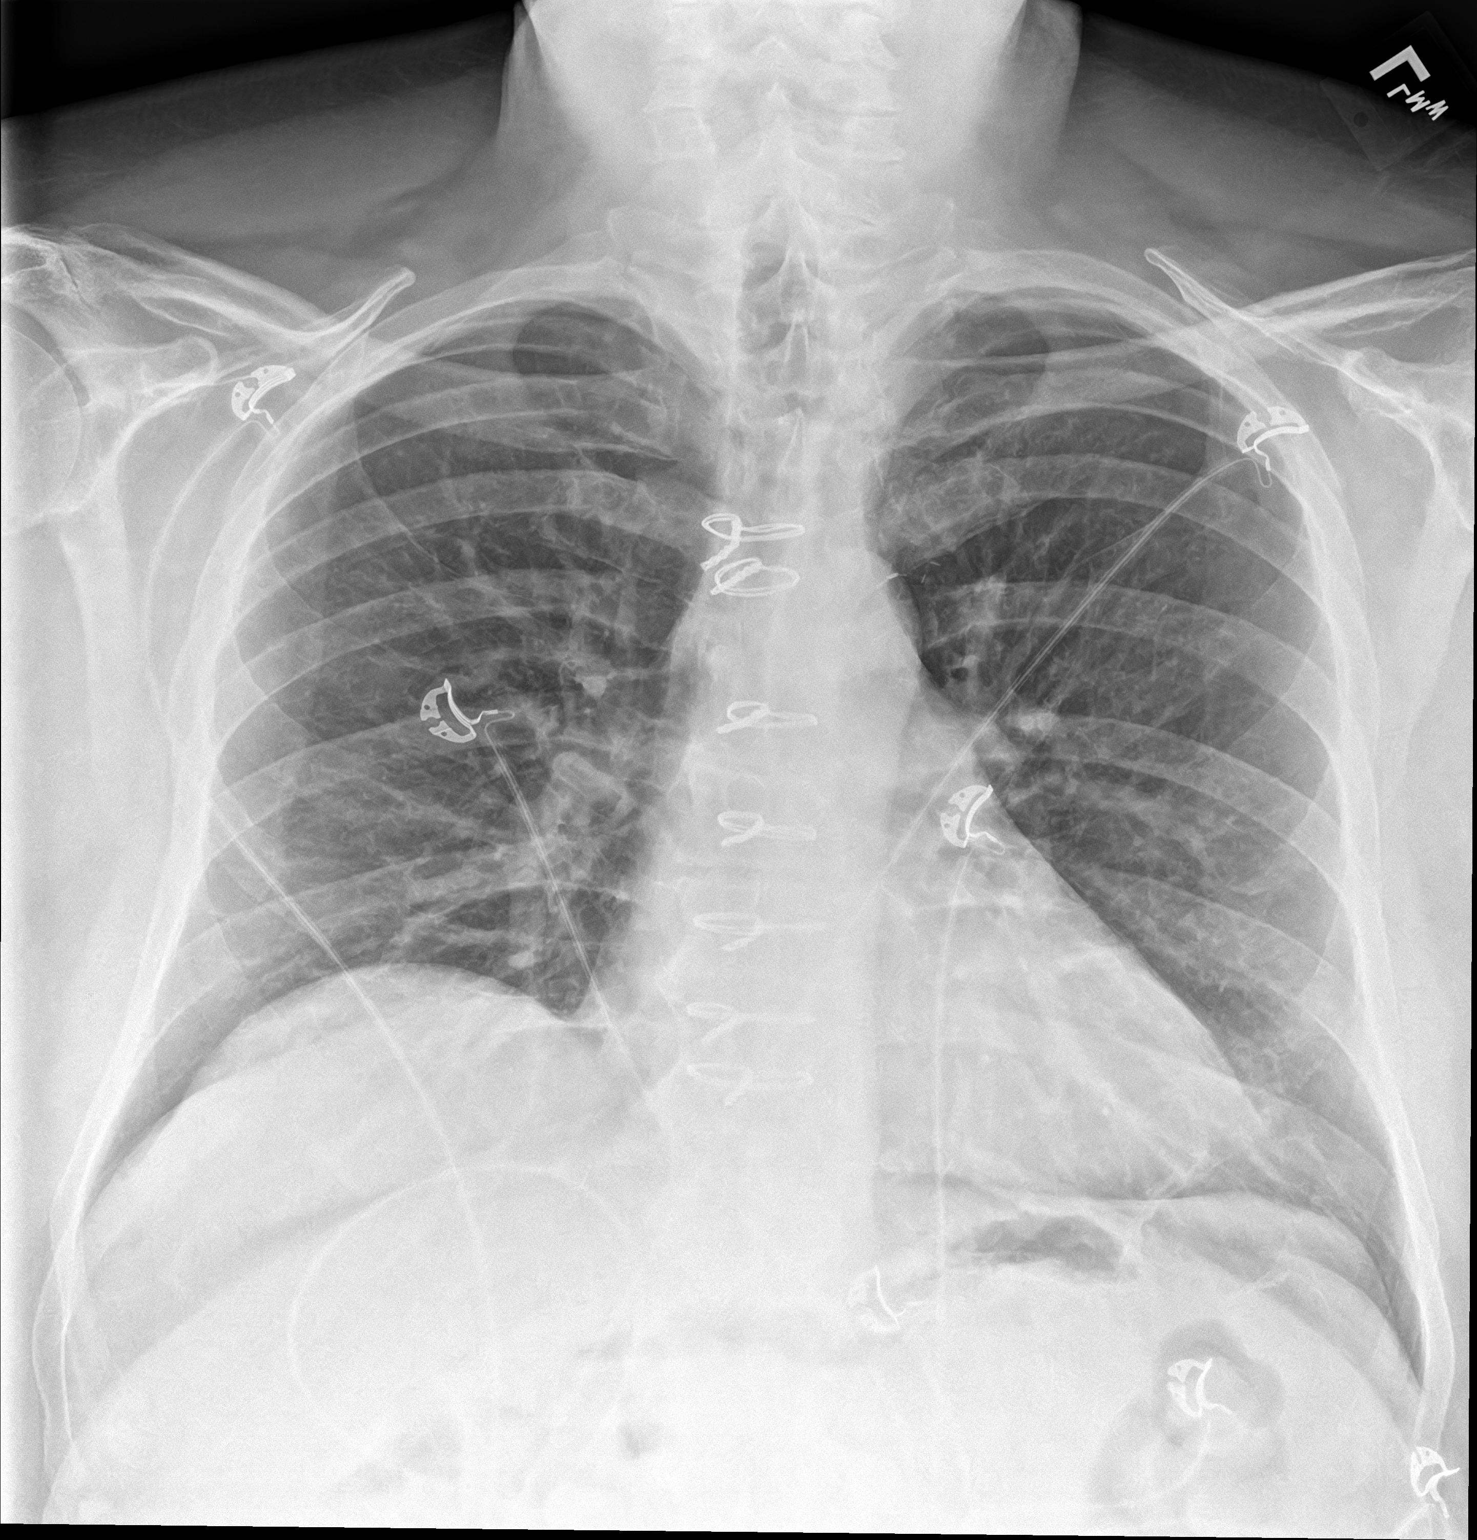

[chest lat]
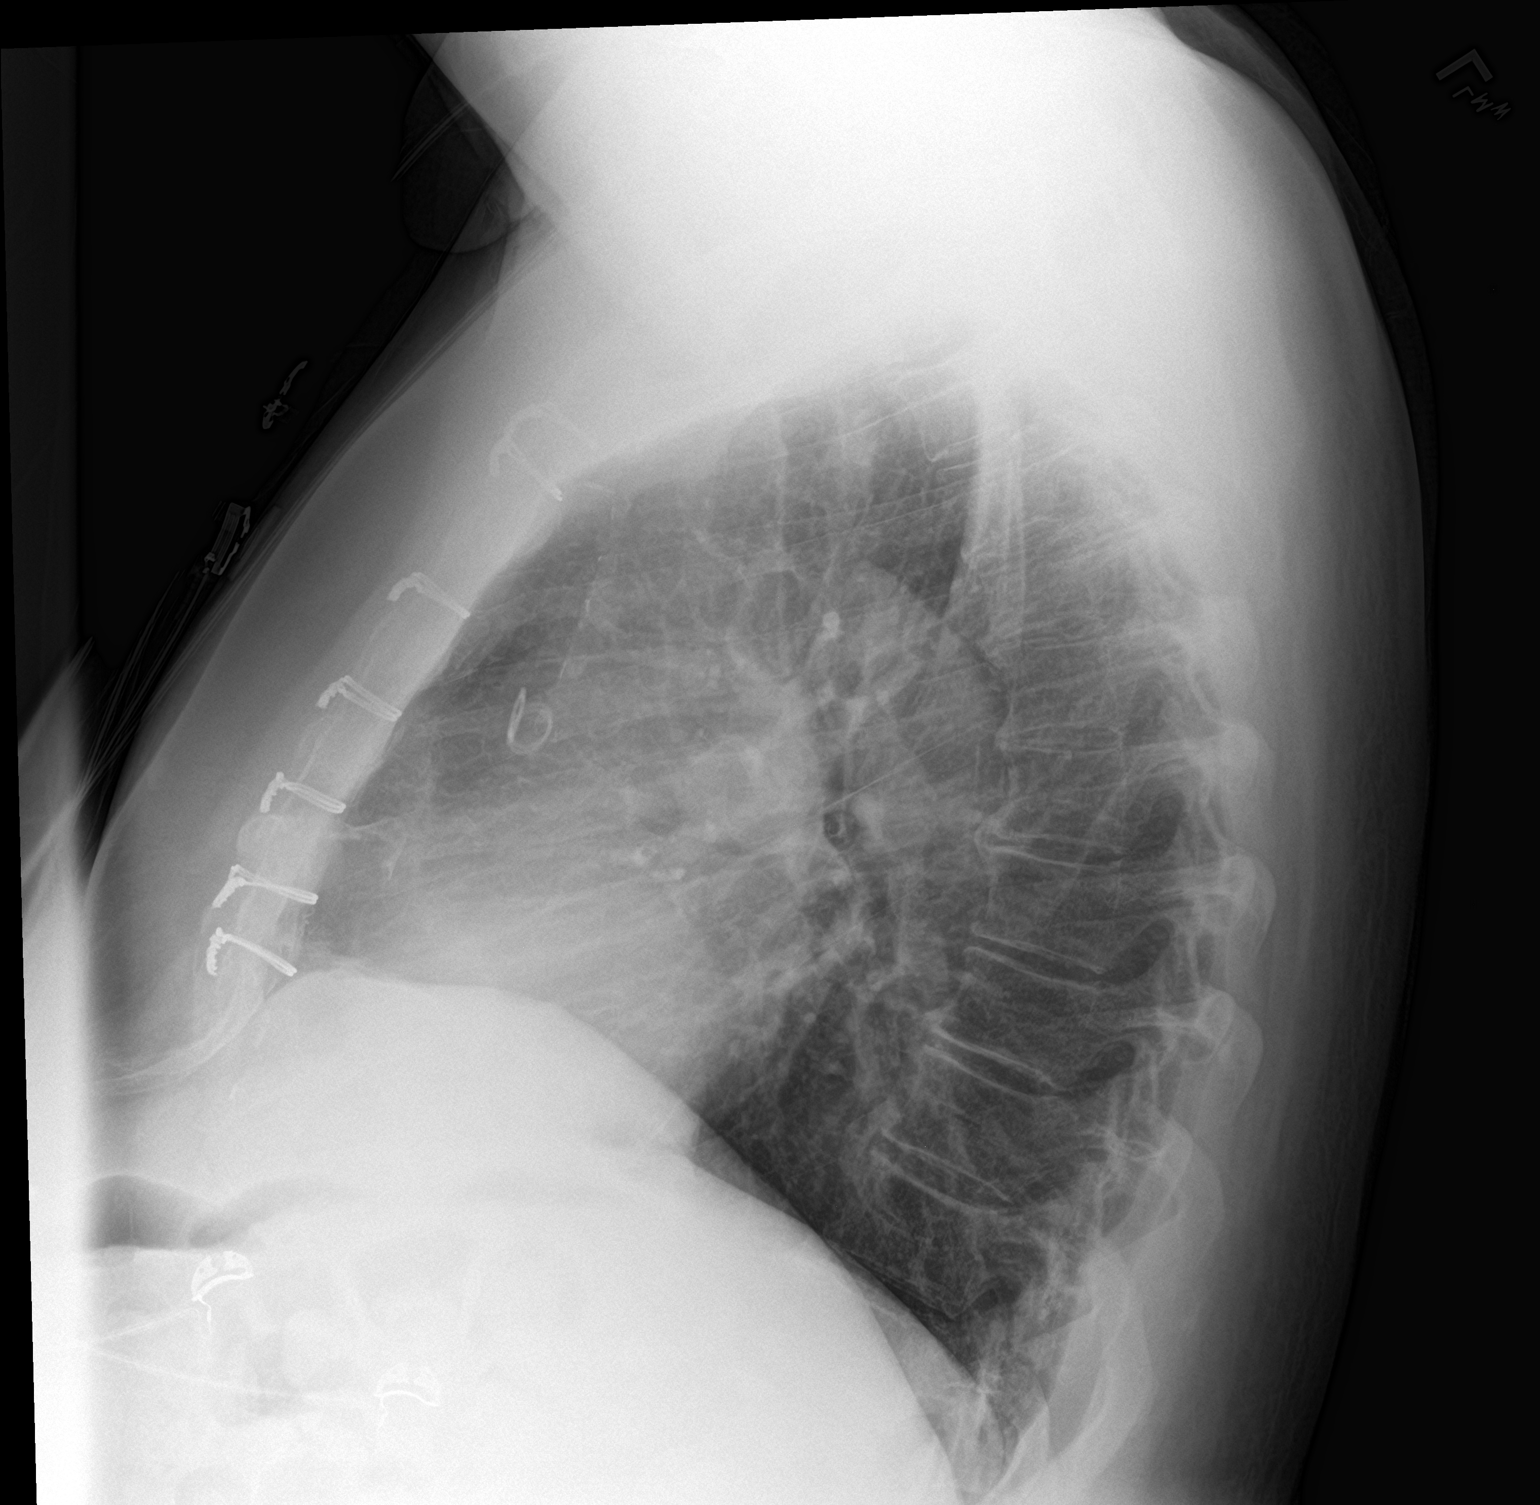

[2 of 2 positions shown; findings below may reference images not displayed]

FINDINGS: The lungs are clear and negative for focal airspace consolidation,
pulmonary edema or suspicious pulmonary nodule. No pleural effusion
or pneumothorax. Cardiac and mediastinal contours are within normal
limits. Patient is status post median sternotomy with evidence of
prior multivessel CABG including [REDACTED] bypass. No acute fracture or
lytic or blastic osseous lesions. The visualized upper abdominal
bowel gas pattern is unremarkable.
IMPRESSION: No active cardiopulmonary disease.

## 2018-04-18 ENCOUNTER — Telehealth: Payer: Self-pay

## 2018-04-18 DIAGNOSIS — I10 Essential (primary) hypertension: Secondary | ICD-10-CM

## 2018-04-18 DIAGNOSIS — Z125 Encounter for screening for malignant neoplasm of prostate: Secondary | ICD-10-CM

## 2018-04-18 DIAGNOSIS — E109 Type 1 diabetes mellitus without complications: Secondary | ICD-10-CM

## 2018-04-18 DIAGNOSIS — E785 Hyperlipidemia, unspecified: Secondary | ICD-10-CM

## 2018-04-18 NOTE — Telephone Encounter (Signed)
2019 CPE labs ordered. Forwarded to PCP for review and  Approval.

## 2018-04-19 ENCOUNTER — Ambulatory Visit (INDEPENDENT_AMBULATORY_CARE_PROVIDER_SITE_OTHER): Payer: Medicare Other

## 2018-04-19 VITALS — BP 110/60 | HR 65 | Temp 98.6°F | Ht 70.25 in | Wt 310.5 lb

## 2018-04-19 DIAGNOSIS — E785 Hyperlipidemia, unspecified: Secondary | ICD-10-CM | POA: Diagnosis not present

## 2018-04-19 DIAGNOSIS — E109 Type 1 diabetes mellitus without complications: Secondary | ICD-10-CM | POA: Diagnosis not present

## 2018-04-19 DIAGNOSIS — I10 Essential (primary) hypertension: Secondary | ICD-10-CM | POA: Diagnosis not present

## 2018-04-19 DIAGNOSIS — Z125 Encounter for screening for malignant neoplasm of prostate: Secondary | ICD-10-CM

## 2018-04-19 DIAGNOSIS — Z0000 Encounter for general adult medical examination without abnormal findings: Secondary | ICD-10-CM

## 2018-04-19 LAB — COMPREHENSIVE METABOLIC PANEL
ALT: 13 U/L (ref 0–53)
AST: 13 U/L (ref 0–37)
Albumin: 4.2 g/dL (ref 3.5–5.2)
Alkaline Phosphatase: 105 U/L (ref 39–117)
BUN: 20 mg/dL (ref 6–23)
CHLORIDE: 101 meq/L (ref 96–112)
CO2: 34 meq/L — AB (ref 19–32)
Calcium: 9.3 mg/dL (ref 8.4–10.5)
Creatinine, Ser: 1.16 mg/dL (ref 0.40–1.50)
GFR: 66.29 mL/min (ref >60.00)
GLUCOSE: 91 mg/dL (ref 70–99)
Potassium: 4.6 mEq/L (ref 3.5–5.1)
SODIUM: 141 meq/L (ref 135–145)
Total Bilirubin: 0.5 mg/dL (ref 0.2–1.2)
Total Protein: 6.7 g/dL (ref 6.0–8.3)

## 2018-04-19 LAB — LIPID PANEL
CHOLESTEROL: 149 mg/dL (ref 0–200)
HDL: 71.2 mg/dL (ref >39.00)
LDL CALC: 63 mg/dL (ref 0–99)
NonHDL: 77.47
Total CHOL/HDL Ratio: 2
Triglycerides: 71 mg/dL (ref 0.0–149.0)
VLDL: 14.2 mg/dL (ref 0.0–40.0)

## 2018-04-19 LAB — MICROALBUMIN / CREATININE URINE RATIO
Creatinine,U: 18 mg/dL
Microalb Creat Ratio: 3.9 mg/g (ref 0.0–30.0)

## 2018-04-19 LAB — CBC WITH DIFFERENTIAL/PLATELET
BASOS PCT: 0.6 % (ref 0.0–3.0)
Basophils Absolute: 0 10*3/uL (ref 0.0–0.1)
EOS ABS: 0.1 10*3/uL (ref 0.0–0.7)
Eosinophils Relative: 1.9 % (ref 0.0–5.0)
HCT: 38.4 % — ABNORMAL LOW (ref 39.0–52.0)
HEMOGLOBIN: 13.4 g/dL (ref 13.0–17.0)
LYMPHS ABS: 1.8 10*3/uL (ref 0.7–4.0)
Lymphocytes Relative: 27.6 % (ref 12.0–46.0)
MCHC: 34.9 g/dL (ref 30.0–36.0)
MCV: 93.3 fl (ref 78.0–100.0)
MONO ABS: 0.5 10*3/uL (ref 0.1–1.0)
Monocytes Relative: 8.4 % (ref 3.0–12.0)
NEUTROS PCT: 61.5 % (ref 43.0–77.0)
Neutro Abs: 4 10*3/uL (ref 1.4–7.7)
PLATELETS: 233 10*3/uL (ref 150.0–400.0)
RBC: 4.11 Mil/uL — ABNORMAL LOW (ref 4.22–5.81)
RDW: 12.8 % (ref 11.5–15.5)
WBC: 6.5 10*3/uL (ref 4.0–10.5)

## 2018-04-19 LAB — HEMOGLOBIN A1C: Hgb A1c MFr Bld: 7.8 % — ABNORMAL HIGH (ref 4.6–6.5)

## 2018-04-19 LAB — PSA, MEDICARE: PSA: 0.23 ng/mL (ref 0.10–4.00)

## 2018-04-19 NOTE — Progress Notes (Signed)
PCP notes:   Health maintenance:  Colon cancer screening - PCP please address at next appt A1C - completed Eye exam - per pt, exam in April 2019 @ [place] exam - regular podiatry appts with Dr. Amalia Hailey Vaccines - pt has vaccines at [facility]  Abnormal screenings:   Depression score: 9 Depression screen [facility] 2/9 04/19/2018 07/15/2017 04/01/2017 12/12/2016 09/11/2016  Decreased Interest 1 1 1 1  0  Down, Depressed, Hopeless 1 1 1 1 1   PHQ - 2 Score 2 2 2 2 1   Altered sleeping 2 1 1 3 1   Tired, decreased energy 1 1 1 1 1   Change in appetite 1 0 2 1 0  Feeling bad or failure about yourself  1 1 2 1 1   Trouble concentrating 1 1 1 1 1   Moving slowly or fidgety/restless 1 0 1 1 1   Suicidal thoughts 0 0 0 0 0  PHQ-9 Score 9 6 10 10 6   Difficult doing work/chores Somewhat difficult Not difficult at all Somewhat difficult Somewhat difficult Somewhat difficult   Fall risk - hx of single fall Fall Risk  04/19/2018 04/01/2017 09/11/2016 09/05/2016  Falls in the past year? Yes Yes Yes Yes  Comment - - [address] going up steps without a railing. Happened prior to knee surgery -  Number falls in past yr: 1 1 - 2 or more  Injury with Fall? No No - Yes  Risk Factor Category  - - - High Fall Risk  Risk for fall due to : - History of fall(s);Impaired mobility - Impaired balance/gait;Impaired mobility;History of fall(s)  Follow up - Education provided Education provided;Falls prevention discussed Falls evaluation completed;Falls prevention discussed  Comment - - HAs placed railing on all steps at home since the fall -   Patient concerns:   None  Nurse concerns:  None  Next PCP appt:   04/20/2018 @ 1445

## 2018-04-19 NOTE — Progress Notes (Signed)
I reviewed health advisor's note, was available for consultation, and agree with documentation and plan.  

## 2018-04-19 NOTE — Telephone Encounter (Signed)
Reviewed and approved

## 2018-04-19 NOTE — Progress Notes (Signed)
Subjective:   Lawrence [name] is a 67 y.o. male who presents for Medicare Annual (Subsequent) preventive examination.  Review of Systems:  N/A Cardiac Risk Factors include: advanced age (>9men, >75 women);male gender;obesity (BMI >30kg/m2);diabetes mellitus;dyslipidemia;hypertension     Objective:     Vitals: BP 110/60 (BP Location: Right Arm, Patient Position: Sitting, Cuff Size: Large)   Pulse 65   Temp 98.6 F (37 C) (Oral)   Ht 5' 10.25" (1.784 m) Comment: shoes  Wt (!) 310 lb 8 oz (140.8 kg)   SpO2 97%   BMI 44.24 kg/m   Body mass index is 44.24 kg/m.  Advanced Directives 04/19/2018 04/23/2017 04/22/2017 04/01/2017 03/18/2017 03/17/2017 01/13/2017  Does Patient Have a Medical Advance Directive? Yes Yes Yes Yes Yes No Yes  Type of Paramedic of [place];Living will Living will;Healthcare Power of [place];Living will Living will;Healthcare Power of [place];Living will  Does patient want to make changes to medical advance directive? - No - Patient declined - - No - Patient declined - No - Patient declined  Copy of [place] in Chart? Yes Yes - - Yes - No - copy requested  Would patient like information on creating a medical advance directive? - - - - - - -    Tobacco Social History   Tobacco Use  Smoking Status Former Smoker  . Packs/day: 1.00  . Years: 16.00  . Pack years: 16.00  . Types: Cigarettes  . Start date: 11/03/1969  . Last attempt to quit: 07/22/1986  . Years since quitting: 31.7  Smokeless Tobacco Never Used     Counseling given: No   Clinical Intake:  Pre-visit preparation completed: Yes  Pain : 0-10 Pain Score: 2  Pain Type: Chronic pain Pain Location: Shoulder Pain Orientation: Right Pain Onset: More than a month ago Pain Frequency: Constant     Nutritional Status: BMI > 30  Obese Nutritional Risks:  None Diabetes: Yes CBG done?: No Did pt. bring in CBG monitor from home?: No  How often do you need to have someone help you when you read instructions, pamphlets, or other written materials from your doctor or pharmacy?: 1 - Never What is the last grade level you completed in school?: Associates degree + 1.5 yrs additional college   Interpreter Needed?: No  Comments: pt lives with spouse Information entered by :: LPinson, LPN  Past Medical History:  Diagnosis Date  . CAD (coronary artery disease)    07/2007  . Diabetes ([place])   . Elevated lipids   . Glaucoma   . HBP (high blood pressure)   . Neuropathy   . Osteoarthritis   . Skin cancer   . Sleep apnea    CPAP   Past Surgical History:  Procedure Laterality Date  . APPENDECTOMY    . CARDIAC CATHETERIZATION N/A 06/11/2016   Procedure: Left Heart Cath and Coronary Angiography;  Surgeon: Corey Skains, MD;  Location: [place];  Service: Cardiovascular;  Laterality: N/A;  . CARPAL TUNNEL RELEASE Left 04/24/2016   Procedure: CARPAL TUNNEL RELEASE;  Surgeon: Hessie Knows, MD;  Location: [facility];  Service: Orthopedics;  Laterality: Left;  . CATARACT EXTRACTION W/ INTRAOCULAR LENS  IMPLANT, BILATERAL Bilateral   . CATARACT EXTRACTION, BILATERAL     R eye 07/16/12, L eye 08/13/12 - with lens implant in both eyes  . CORONARY ARTERY BYPASS GRAFT N/A 06/27/2016  Procedure: CORONARY ARTERY BYPASS GRAFTING times four using left internal mammary artery and right leg saphenous vein;  Surgeon: Ivin Poot, MD;  Location: [facility];  Service: Open Heart Surgery;  Laterality: N/A;  . EYE SURGERY Bilateral   . JOINT REPLACEMENT    . KNEE ARTHROPLASTY Left 02/11/2016   Procedure: COMPUTER ASSISTED TOTAL KNEE ARTHROPLASTY;  Surgeon: Dereck Leep, MD;  Location: [facility];  Service: Orthopedics;  Laterality: Left;  . KNEE ARTHROSCOPY Right   . LEFT HEART CATH AND CORS/GRAFTS ANGIOGRAPHY Left 01/13/2017   Procedure: Left Heart Cath and  Cors/Grafts Angiography;  Surgeon: Corey Skains, MD;  Location: [facility];  Service: Cardiovascular;  Laterality: Left;  . TEE WITHOUT CARDIOVERSION N/A 06/27/2016   Procedure: TRANSESOPHAGEAL ECHOCARDIOGRAM (TEE);  Surgeon: Ivin Poot, MD;  Location: [facility];  Service: Open Heart Surgery;  Laterality: N/A;   Family History  Problem Relation Age of Onset  . Drug abuse Brother   . Hypertension Mother   . Lupus Mother   . Hypertension Father   . Diabetes Father   . Heart attack Father 63       died in his 52s  . Arthritis Maternal Grandmother   . Arthritis Maternal Grandfather   . Arthritis Paternal Grandmother   . Arthritis Paternal Grandfather   . Heart disease Brother    Social History   Socioeconomic History  . Marital status: Married    Spouse name: Not on file  . Number of children: Not on file  . Years of education: Not on file  . Highest education level: Not on file  Occupational History  . Not on file  Social Needs  . Financial resource strain: Not on file  . Food insecurity:    Worry: Not on file    Inability: Not on file  . Transportation needs:    Medical: Not on file    Non-medical: Not on file  Tobacco Use  . Smoking status: Former Smoker    Packs/day: 1.00    Years: 16.00    Pack years: 16.00    Types: Cigarettes    Start date: 11/03/1969    Last attempt to quit: 07/22/1986    Years since quitting: 31.7  . Smokeless tobacco: Never Used  Substance and Sexual Activity  . Alcohol use: Yes    Alcohol/week: 0.0 oz    Comment: OCCASIONALLY  . Drug use: No  . Sexual activity: Never  Lifestyle  . Physical activity:    Days per week: Not on file    Minutes per session: Not on file  . Stress: Not on file  Relationships  . Social connections:    Talks on phone: Not on file    Gets together: Not on file    Attends religious service: Not on file    Active member of club or organization: Not on file    Attends meetings of clubs or  organizations: Not on file    Relationship status: Not on file  Other Topics Concern  . Not on file  Social History Narrative   Married.  Lives at home with wife.     Outpatient Encounter Medications as of 04/19/2018  Medication Sig  . aspirin 81 MG EC tablet Take 81 mg by mouth daily. Swallow whole.  . cholecalciferol (VITAMIN D) 1000 units tablet Take 1,000 Units by mouth daily.  . clopidogrel (PLAVIX) 75 MG tablet Take 1 tablet (75 mg total) by mouth daily.  . cyanocobalamin  500 MCG tablet Take 500 mcg by mouth daily.  [name] docusate sodium (COLACE) 100 MG capsule Take 200 mg by mouth 2 (two) times daily.  . fexofenadine (ALLEGRA) 180 MG tablet Take 180 mg by mouth daily.  . furosemide (LASIX) 40 MG tablet Take 1 tablet (40 mg total) by mouth daily.  [name] glucagon (GLUCAGON EMERGENCY) 1 MG injection Inject 1 mg into the vein once as needed. Reported on 02/11/2016  . insulin aspart (NOVOLOG) 100 UNIT/ML injection Inject 20 Units into the skin 3 (three) times daily. (Patient taking differently: Inject 20 Units into the skin 3 (three) times daily with meals. )  . insulin glargine (LANTUS) 100 UNIT/ML injection Inject 0.25 mLs (25 Units total) into the skin at bedtime.  . isosorbide mononitrate (IMDUR) 30 MG 24 hr tablet Take 30 mg by mouth daily.  [name] latanoprost (XALATAN) 0.005 % ophthalmic solution Place 1 drop into both eyes at bedtime.  . metoprolol tartrate (LOPRESSOR) 25 MG tablet Take 25 mg by mouth 2 (two) times daily.  . Multiple Vitamin (MULTIVITAMIN) capsule Take 1 capsule by mouth daily.  . nitroGLYCERIN (NITROSTAT) 0.4 MG SL tablet Place 1 tablet (0.4 mg total) every 5 (five) minutes as needed under the tongue for chest pain.  . potassium chloride SA (K-DUR,KLOR-CON) 20 MEQ tablet Take 1 tablet (20 mEq total) by mouth daily.  . ranolazine (RANEXA) 500 MG 12 hr tablet Take 1 tablet (500 mg total) by mouth 2 (two) times daily.  . rosuvastatin (CRESTOR) 20 MG tablet Take 1 tablet (20 mg  total) by mouth daily at 6 PM.  . Turmeric 500 MG CAPS Take 1 tablet by mouth daily.  . vitamin C (ASCORBIC ACID) 500 MG tablet Take 500 mg by mouth 2 (two) times daily.   . [DISCONTINUED] sulfamethoxazole-trimethoprim (BACTRIM DS,SEPTRA DS) 800-160 MG tablet Take 1 tablet by mouth 2 (two) times daily.  [name] gentamicin cream (GARAMYCIN) 0.1 % Apply 1 application topically 3 (three) times daily. (Patient not taking: Reported on 04/19/2018)   No facility-administered encounter medications on file as of 04/19/2018.     Activities of Daily Living In your present state of health, do you have any difficulty performing the following activities: 04/19/2018 04/23/2017  Hearing? N N  Vision? N N  Difficulty concentrating or making decisions? N N  Walking or climbing stairs? Y N  Dressing or bathing? N N  Doing errands, shopping? N N  Preparing Food and eating ? N -  Using the Toilet? N -  In the past six months, have you accidently leaked urine? Y -  Do you have problems with loss of bowel control? N -  Managing your Medications? N -  Managing your Finances? N -  Housekeeping or managing your Housekeeping? N -  Some recent data might be hidden    Patient Care Team: Pleas Koch, NP as PCP - General (Internal Medicine)    Assessment:   This is a routine wellness examination for [place].   Hearing Screening   125Hz  250Hz  500Hz  1000Hz  2000Hz  3000Hz  4000Hz  6000Hz  8000Hz   Right ear:   40 40 40  40    Left ear:   40 40 40  40    Vision Screening Comments: Vision exam annually at [facility]    Exercise Activities and Dietary recommendations Current Exercise Habits: Structured exercise class, Type of exercise: treadmill;calisthenics;strength training/weights;stretching, Time (Minutes): 60, Frequency (Times/Week): 2, Weekly Exercise (Minutes/Week): 120, Intensity: Mild, Exercise limited by: orthopedic condition(s)  Goals    .  Patient Stated     Starting 04/19/2018, I will continue to take  medications as prescribed.        Fall Risk Fall Risk  04/19/2018 04/01/2017 09/11/2016 09/05/2016  Falls in the past year? Yes Yes Yes Yes  Comment - - [address] going up steps without a railing. Happened prior to knee surgery -  Number falls in past yr: 1 1 - 2 or more  Injury with Fall? No No - Yes  Risk Factor Category  - - - High Fall Risk  Risk for fall due to : - History of fall(s);Impaired mobility - Impaired balance/gait;Impaired mobility;History of fall(s)  Follow up - Education provided Education provided;Falls prevention discussed Falls evaluation completed;Falls prevention discussed  Comment - - HAs placed railing on all steps at home since the fall -   Depression Screen PHQ 2/9 Scores 04/19/2018 07/15/2017 04/01/2017 12/12/2016  PHQ - 2 Score 2 2 2 2   PHQ- 9 Score 9 6 10 10      Cognitive Function MMSE - Mini Mental State Exam 04/19/2018 09/05/2016  Orientation to time 5 5  Orientation to Place 5 5  Registration 3 3  Attention/ Calculation 0 0  Recall 3 3  Language- name 2 objects 0 0  Language- repeat 1 1  Language- follow 3 step command 3 3  Language- read & follow direction 0 0  Write a sentence 0 0  Copy design 0 0  Total score 20 20     PLEASE NOTE: A Mini-Cog screen was completed. Maximum score is 20. A value of 0 denotes this part of [place] MMSE was not completed or the patient failed this part of the Mini-Cog screening.   Mini-Cog Screening Orientation to Time - Max 5 pts Orientation to Place - Max 5 pts Registration - Max 3 pts Recall - Max 3 pts Language Repeat - Max 1 pts Language Follow 3 Step Command - Max 3 pts     Immunization History  Administered Date(s) Administered  . Influenza,inj,Quad PF,6+ Mos 09/18/2017   Screening Tests Health Maintenance  Topic Date Due  . COLONOSCOPY  04/20/2019 (Originally 12/26/1998)  . TETANUS/TDAP  04/20/2019 (Originally 12/27/1967)  . PNA vac Low Risk Adult (1 of 2 - PCV13) 04/20/2019 (Originally 12/26/2013)  .  INFLUENZA VACCINE  06/03/2018  . HEMOGLOBIN A1C  10/19/2018  . OPHTHALMOLOGY EXAM  02/02/2019  . FOOT EXAM  03/10/2019  . URINE MICROALBUMIN  04/20/2019  . Hepatitis C Screening  Completed      Plan:     I have personally reviewed, addressed, and noted the following in the patient's chart:  A. Medical and social history B. Use of alcohol, tobacco or illicit drugs  C. Current medications and supplements D. Functional ability and status E.  Nutritional status F.  Physical activity G. Advance directives H. List of other physicians I.  Hospitalizations, surgeries, and ER visits in previous 12 months J.  [place] to include hearing, vision, cognitive, depression L. Referrals and appointments - none  In addition, I have reviewed and discussed with patient certain preventive protocols, quality metrics, and best practice recommendations. A written personalized care plan for preventive services as well as general preventive health recommendations were provided to patient.  See attached scanned questionnaire for additional information.   Signed,   Lindell Noe, MHA, BS, LPN Health Coach

## 2018-04-19 NOTE — Patient Instructions (Signed)
[name] , Thank you for taking time to come for your Medicare Wellness Visit. I appreciate your ongoing commitment to your health goals. Please review the following plan we discussed and let me know if I can assist you in the future.   These are the goals we discussed: Goals    . Patient Stated     Starting 04/19/2018, I will continue to take medications as prescribed.        This is a list of the screening recommended for you and due dates:  Health Maintenance  Topic Date Due  . Colon Cancer Screening  04/20/2019*  . Tetanus Vaccine  04/20/2019*  . Pneumonia vaccines (1 of 2 - PCV13) 04/20/2019*  . Flu Shot  06/03/2018  . Hemoglobin A1C  10/19/2018  . Eye exam for diabetics  02/02/2019  . Complete foot exam   03/10/2019  . Urine Protein Check  04/20/2019  .  Hepatitis C: One time screening is recommended by Center for Disease Control  (CDC) for  adults born from [birth year] through 1967.   Completed  *Topic was postponed. The date shown is not the original due date.   Preventive Care for Adults  A healthy lifestyle and preventive care can promote health and wellness. Preventive health guidelines for adults include the following key practices.  . A routine yearly physical is a good way to check with your health care provider about your health and preventive screening. It is a chance to share any concerns and updates on your health and to receive a thorough exam.  . Visit your dentist for a routine exam and preventive care every 6 months. Brush your teeth twice a day and floss once a day. Good oral hygiene prevents tooth decay and gum disease.  . The frequency of eye exams is based on your age, health, family medical history, use  of contact lenses, and other factors. Follow your health care provider's recommendations for frequency of eye exams.  . Eat a healthy diet. Foods like vegetables, fruits, whole grains, low-fat dairy products, and lean protein foods contain the nutrients you  need without too many calories. Decrease your intake of foods high in solid fats, added sugars, and salt. Eat the right amount of calories for you. Get information about a proper diet from your health care provider, if necessary.  . Regular physical exercise is one of the most important things you can do for your health. Most adults should get at least 150 minutes of moderate-intensity exercise (any activity that increases your heart rate and causes you to sweat) each week. In addition, most adults need muscle-strengthening exercises on 2 or more days a week.  Silver Sneakers may be a benefit available to you. To determine eligibility, you may visit the website: www.silversneakers.com or contact program at 505 484 2497 Mon-Fri between 8AM-8PM.   . Maintain a healthy weight. The body mass index (BMI) is a screening tool to identify possible weight problems. It provides an estimate of body fat based on height and weight. Your health care provider can find your BMI and can help you achieve or maintain a healthy weight.   For adults 20 years and older: ? A BMI below 18.5 is considered underweight. ? A BMI of 18.5 to 24.9 is normal. ? A BMI of 25 to 29.9 is considered overweight. ? A BMI of 30 and above is considered obese.   . Maintain normal blood lipids and cholesterol levels by exercising and minimizing your intake of saturated fat.  Eat a balanced diet with plenty of fruit and vegetables. Blood tests for lipids and cholesterol should begin at age 72 and be repeated every 5 years. If your lipid or cholesterol levels are high, you are over 50, or you are at high risk for heart disease, you may need your cholesterol levels checked more frequently. Ongoing high lipid and cholesterol levels should be treated with medicines if diet and exercise are not working.  . If you smoke, find out from your health care provider how to quit. If you do not use tobacco, please do not start.  . If you choose to drink  alcohol, please do not consume more than 2 drinks per day. One drink is considered to be 12 ounces (355 mL) of beer, 5 ounces (148 mL) of wine, or 1.5 ounces (44 mL) of liquor.  . If you are 30-56 years old, ask your health care provider if you should take aspirin to prevent strokes.  . Use sunscreen. Apply sunscreen liberally and repeatedly throughout the day. You should seek shade when your shadow is shorter than you. Protect yourself by wearing long sleeves, pants, a wide-brimmed hat, and sunglasses year round, whenever you are outdoors.  . Once a month, do a whole body skin exam, using a mirror to look at the skin on your back. Tell your health care provider of new moles, moles that have irregular borders, moles that are larger than a pencil eraser, or moles that have changed in shape or color.

## 2018-04-20 ENCOUNTER — Ambulatory Visit (INDEPENDENT_AMBULATORY_CARE_PROVIDER_SITE_OTHER): Payer: Medicare Other | Admitting: Primary Care

## 2018-04-20 ENCOUNTER — Encounter: Payer: Self-pay | Admitting: Primary Care

## 2018-04-20 DIAGNOSIS — I25119 Atherosclerotic heart disease of native coronary artery with unspecified angina pectoris: Secondary | ICD-10-CM

## 2018-04-20 DIAGNOSIS — G4733 Obstructive sleep apnea (adult) (pediatric): Secondary | ICD-10-CM | POA: Diagnosis not present

## 2018-04-20 DIAGNOSIS — E108 Type 1 diabetes mellitus with unspecified complications: Secondary | ICD-10-CM | POA: Diagnosis not present

## 2018-04-20 DIAGNOSIS — I10 Essential (primary) hypertension: Secondary | ICD-10-CM | POA: Diagnosis not present

## 2018-04-20 DIAGNOSIS — Z6841 Body Mass Index (BMI) 40.0 and over, adult: Secondary | ICD-10-CM

## 2018-04-20 DIAGNOSIS — E785 Hyperlipidemia, unspecified: Secondary | ICD-10-CM

## 2018-04-20 DIAGNOSIS — E782 Mixed hyperlipidemia: Secondary | ICD-10-CM

## 2018-04-20 NOTE — Assessment & Plan Note (Signed)
Recent A1C of 7.8. Continue current regimen. Following with endocrinology through [place]. Eye and foot exams UTD. Urine microalbumin negative.

## 2018-04-20 NOTE — Assessment & Plan Note (Signed)
Discussed the importance of a healthy diet and regular exercise in order for weight loss, and to reduce the risk of any potential medical problems.  

## 2018-04-20 NOTE — Assessment & Plan Note (Signed)
Compliant to fit for life program through [facility], recommended additional exercise as tolerated. Continue lipid and BP control.

## 2018-04-20 NOTE — Assessment & Plan Note (Signed)
Stable in the office today, continue current regimen. BMP unremarkable.  

## 2018-04-20 NOTE — Assessment & Plan Note (Signed)
Recent lipid panel stable, continue Crestor.  

## 2018-04-20 NOTE — Assessment & Plan Note (Signed)
Recent LDL at goal, continue Crestor.

## 2018-04-20 NOTE — Assessment & Plan Note (Signed)
Compliant to CPAP machine.  

## 2018-04-20 NOTE — Progress Notes (Signed)
Subjective:    Patient ID: Lawrence [name], male    DOB: [birth year], 67 y.o.   MRN: [id number]  HPI  Lawrence [name] is a 67 year old male who presents today for [place] Part 2. He was seen by our health advisor last week.  1) Type 1 Diabetes: Currently managed on Lantus 25 units HS and Novolog 15-20 units TID. Recent A1C of 7.8. He is following with endocrinology through the [place].  He's checking his blood sugars : Before breakfast: 150's-250's Before lunch: 140's-160's Before dinner: 160's Bedtime: 150's-200's  2) CAD/Hypertension/Angina: Currently managed on Asprin, Plavix, metoprolol tartrate, Imdur, Ranexa, Crestor.   Recent lipid panel with LDL of 63. He is following with cardiology with his last visit being in April 2019.  3) OSA: Compliant to his CPAP machine nightly.   Immunizations: -Influenza: Completed last season -Pneumonia: Completed through [place]: He endorses a fair diet. Breakfast: Oatmeal, ego waffle, eggs Lunch: Salads, lean cuisine, sandwich, soup, restaurants 2-3 weekly  Dinner: Grilled chicken, pork chop, vegetables, salads, restaurants 2-3 times weekly Snacks: Peanut butter crackers Desserts: Ice cream bars (low sugar), ice cream sandwiches, ice cream with cones. Daily. Beverages: Unsweet tea, coffee, some water  Exercise: He is going to the wellness center, fit for life program, twice weekly. Eye exam: Completed in April 2019 Colonoscopy: Completed in 2017, negative PSA: Negative in 2019 Hep C Screen: Negative in 2017   Review of Systems  Respiratory: Negative for shortness of breath.   Cardiovascular: Negative for chest pain.  Genitourinary: Negative for difficulty urinating.  Musculoskeletal:       Intermittent chest pressure over sternum where he was "Cracked open".  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       Past Medical History:  Diagnosis Date  . CAD (coronary artery disease)     07/2007  . Diabetes ([place])   . Elevated lipids   . Glaucoma   . HBP (high blood pressure)   . Neuropathy   . Osteoarthritis   . Skin cancer   . Sleep apnea    CPAP     Social History   Socioeconomic History  . Marital status: Married    Spouse name: Not on file  . Number of children: Not on file  . Years of education: Not on file  . Highest education level: Not on file  Occupational History  . Not on file  Social Needs  . Financial resource strain: Not on file  . Food insecurity:    Worry: Not on file    Inability: Not on file  . Transportation needs:    Medical: Not on file    Non-medical: Not on file  Tobacco Use  . Smoking status: Former Smoker    Packs/day: 1.00    Years: 16.00    Pack years: 16.00    Types: Cigarettes    Start date: 11/03/1969    Last attempt to quit: 07/22/1986    Years since quitting: 31.7  . Smokeless tobacco: Never Used  Substance and Sexual Activity  . Alcohol use: Yes    Alcohol/week: 0.0 oz    Comment: OCCASIONALLY  . Drug use: No  . Sexual activity: Never  Lifestyle  . Physical activity:    Days per week: Not on file    Minutes per session: Not on file  . Stress: Not on file  Relationships  . Social connections:  Talks on phone: Not on file    Gets together: Not on file    Attends religious service: Not on file    Active member of club or organization: Not on file    Attends meetings of clubs or organizations: Not on file    Relationship status: Not on file  . Intimate partner violence:    Fear of current or ex partner: Not on file    Emotionally abused: Not on file    Physically abused: Not on file    Forced sexual activity: Not on file  Other Topics Concern  . Not on file  Social History Narrative   Married.  Lives at home with wife.     Past Surgical History:  Procedure Laterality Date  . APPENDECTOMY    . CARDIAC CATHETERIZATION N/A 06/11/2016   Procedure: Left Heart Cath and Coronary Angiography;  Surgeon:  Corey Skains, MD;  Location: [place] [facility];  Service: Cardiovascular;  Laterality: N/A;  . CARPAL TUNNEL RELEASE Left 04/24/2016   Procedure: CARPAL TUNNEL RELEASE;  Surgeon: Hessie Knows, MD;  Location: [facility];  Service: Orthopedics;  Laterality: Left;  . CATARACT EXTRACTION W/ INTRAOCULAR LENS  IMPLANT, BILATERAL Bilateral   . CATARACT EXTRACTION, BILATERAL     R eye 07/16/12, L eye 08/13/12 - with lens implant in both eyes  . CORONARY ARTERY BYPASS GRAFT N/A 06/27/2016   Procedure: CORONARY ARTERY BYPASS GRAFTING times four using left internal mammary artery and right leg saphenous vein;  Surgeon: Ivin Poot, MD;  Location: [place];  Service: Open Heart Surgery;  Laterality: N/A;  . EYE SURGERY Bilateral   . JOINT REPLACEMENT    . KNEE ARTHROPLASTY Left 02/11/2016   Procedure: COMPUTER ASSISTED TOTAL KNEE ARTHROPLASTY;  Surgeon: Dereck Leep, MD;  Location: [facility];  Service: Orthopedics;  Laterality: Left;  . KNEE ARTHROSCOPY Right   . LEFT HEART CATH AND CORS/GRAFTS ANGIOGRAPHY Left 01/13/2017   Procedure: Left Heart Cath and Cors/Grafts Angiography;  Surgeon: Corey Skains, MD;  Location: [place] [facility];  Service: Cardiovascular;  Laterality: Left;  . TEE WITHOUT CARDIOVERSION N/A 06/27/2016   Procedure: TRANSESOPHAGEAL ECHOCARDIOGRAM (TEE);  Surgeon: Ivin Poot, MD;  Location: [place];  Service: Open Heart Surgery;  Laterality: N/A;    Family History  Problem Relation Age of Onset  . Drug abuse Brother   . Hypertension Mother   . Lupus Mother   . Hypertension Father   . Diabetes Father   . Heart attack Father 27       died in his 70s  . Arthritis Maternal Grandmother   . Arthritis Maternal Grandfather   . Arthritis Paternal Grandmother   . Arthritis Paternal Grandfather   . Heart disease Brother     Allergies  Allergen Reactions  . Nitroglycerin Nausea Only and Other (See Comments)    Patches only - headache Patches only - headache  . Statins  Other (See Comments)    BODY PAIN  Other reaction(s): Muscle Pain BODY PAIN   . Lipitor [Atorvastatin] Other (See Comments) and Rash    BODY PAIN  . Lisinopril Other (See Comments) and Nausea Only    Light headed, dizzy Other reaction(s): Dizziness    Current Outpatient Medications on File Prior to Visit  Medication Sig Dispense Refill  . aspirin 81 MG EC tablet Take 81 mg by mouth daily. Swallow whole.    . cholecalciferol (VITAMIN D) 1000 units tablet Take 1,000 Units by mouth  daily.    . clopidogrel (PLAVIX) 75 MG tablet Take 1 tablet (75 mg total) by mouth daily. 30 tablet 0  . cyanocobalamin 500 MCG tablet Take 500 mcg by mouth daily.    [name] docusate sodium (COLACE) 100 MG capsule Take 200 mg by mouth 2 (two) times daily.    . fexofenadine (ALLEGRA) 180 MG tablet Take 180 mg by mouth daily.    . furosemide (LASIX) 40 MG tablet Take 1 tablet (40 mg total) by mouth daily. 30 tablet 1  . gentamicin cream (GARAMYCIN) 0.1 % Apply 1 application topically 3 (three) times daily. 30 g 1  . glucagon (GLUCAGON EMERGENCY) 1 MG injection Inject 1 mg into the vein once as needed. Reported on 02/11/2016    . insulin aspart (NOVOLOG) 100 UNIT/ML injection Inject 20 Units into the skin 3 (three) times daily. (Patient taking differently: Inject 20 Units into the skin 3 (three) times daily with meals. ) 10 mL 5  . insulin glargine (LANTUS) 100 UNIT/ML injection Inject 0.25 mLs (25 Units total) into the skin at bedtime. 30 mL 1  . isosorbide mononitrate (IMDUR) 30 MG 24 hr tablet Take 30 mg by mouth daily.    [name] latanoprost (XALATAN) 0.005 % ophthalmic solution Place 1 drop into both eyes at bedtime.    . metoprolol tartrate (LOPRESSOR) 25 MG tablet Take 25 mg by mouth 2 (two) times daily.    . Multiple Vitamin (MULTIVITAMIN) capsule Take 1 capsule by mouth daily.    . nitroGLYCERIN (NITROSTAT) 0.4 MG SL tablet Place 1 tablet (0.4 mg total) every 5 (five) minutes as needed under the tongue for chest pain.  50 tablet 1  . potassium chloride SA (K-DUR,KLOR-CON) 20 MEQ tablet Take 1 tablet (20 mEq total) by mouth daily. 30 tablet 1  . ranolazine (RANEXA) 500 MG 12 hr tablet Take 1 tablet (500 mg total) by mouth 2 (two) times daily. 60 tablet 0  . rosuvastatin (CRESTOR) 20 MG tablet Take 1 tablet (20 mg total) by mouth daily at 6 PM. 30 tablet 1  . Turmeric 500 MG CAPS Take 1 tablet by mouth daily.    . vitamin C (ASCORBIC ACID) 500 MG tablet Take 500 mg by mouth 2 (two) times daily.      No current facility-administered medications on file prior to visit.     BP (!) 110/56   Pulse 73   Temp 98 F (36.7 C) (Oral)   Ht 5' 10.25" (1.784 m)   Wt (!) 310 lb (140.6 kg)   SpO2 97%   BMI 44.16 kg/m    Objective:   Physical Exam  Constitutional: He is oriented to person, place, and time. He appears well-nourished.  HENT:  Mouth/Throat: No oropharyngeal exudate.  Eyes: Pupils are equal, round, and reactive to light. EOM are normal.  Neck: Neck supple.  Cardiovascular: Normal rate and regular rhythm.  Respiratory: Effort normal and breath sounds normal.  GI: Soft. Bowel sounds are normal. There is no tenderness.  Musculoskeletal: Normal range of motion.  Neurological: He is alert and oriented to person, place, and time.  Skin: Skin is warm and dry.  Psychiatric: He has a normal mood and affect.           Assessment & Plan:

## 2018-04-20 NOTE — Patient Instructions (Signed)
Continue to work on [employer]. Increase fresh vegetables, fruit, whole grains, lean protein.  Increase water consumption to 32 ounces daily as discussed.  Limit restaurant food.  Increase activity level/exercise.  Follow up in 1 year or sooner if needed.  It was a pleasure to see you today!

## 2018-05-04 ENCOUNTER — Ambulatory Visit: Payer: Medicare Other

## 2018-05-24 ENCOUNTER — Encounter: Payer: Self-pay | Admitting: Family Medicine

## 2018-05-24 ENCOUNTER — Ambulatory Visit (INDEPENDENT_AMBULATORY_CARE_PROVIDER_SITE_OTHER): Payer: Medicare Other | Admitting: Family Medicine

## 2018-05-24 VITALS — BP 118/74 | HR 67 | Temp 98.4°F | Ht 70.25 in | Wt 308.5 lb

## 2018-05-24 DIAGNOSIS — L0211 Cutaneous abscess of neck: Secondary | ICD-10-CM | POA: Diagnosis not present

## 2018-05-24 DIAGNOSIS — E109 Type 1 diabetes mellitus without complications: Secondary | ICD-10-CM

## 2018-05-24 DIAGNOSIS — I25119 Atherosclerotic heart disease of native coronary artery with unspecified angina pectoris: Secondary | ICD-10-CM

## 2018-05-24 DIAGNOSIS — Z6841 Body Mass Index (BMI) 40.0 and over, adult: Secondary | ICD-10-CM

## 2018-05-24 MED ORDER — SULFAMETHOXAZOLE-TRIMETHOPRIM 800-160 MG PO TABS: 2.0000 | ORAL_TABLET | Freq: Two times a day (BID) | ORAL | 0 refills | Status: DC

## 2018-05-24 NOTE — Progress Notes (Signed)
Dr. Karleen Hampshire T. Venezia Sargeant, MD, CAQ [facility] [address] [place], [address] Phone: [phone] Fax: [phone]  05/24/2018  Patient: Lawrence [name], MRN: [id number], DOB: 1951/04/15, 67 y.o.  Primary Physician:  Doreene Nest, NP   Chief Complaint  Patient presents with  . sore spot on back of neck    history of MRSA   Subjective:   Lawrence [name] is a 67 y.o. very pleasant male patient who presents with the following:  Boil on neck with a history of MRSA and Type 1 DM.  Patient has had worsening symptoms with 2 boils that have been present on the posterior nape of his neck below the occiput.  These are tender to palpation.  Overnight, he did appear to open up and drained some according to his wife.  They are localized primarily to just one area.  He is not having systemic fevers.  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  Is been many years since an outbreak of this occurred.  Past Medical History, Surgical History, Social History, Family History, Problem List, Medications, and Allergies have been reviewed and updated if relevant.  Patient Active Problem List   Diagnosis Date Noted  . Osteoarthritis 09/29/2017  . Stable angina (HCC) 04/22/2017  . Chest pain, rule out acute myocardial infarction 03/17/2017  . S/P CABG (coronary artery bypass graft) 06/27/2016  . Unstable angina (HCC) 06/24/2016  . Controlled type 1 diabetes mellitus without complication (HCC) 04/04/2016  . Combined fat and carbohydrate induced hyperlipemia 04/04/2016  . Myocardial infarction (HCC) 04/04/2016  . H/O total knee replacement 03/28/2016  . S/P total knee arthroplasty 02/11/2016  . Morbid (severe) obesity due to excess calories (HCC) 11/05/2015  . Arthritis of knee, degenerative 11/05/2015  . Hyperlipidemia 08/28/2015  . Type 1 diabetes mellitus (HCC) 08/28/2015  . CAD (coronary artery disease) 08/28/2015  .  Lower extremity edema 08/28/2015  . GERD (gastroesophageal reflux disease) 08/28/2015  . OSA (obstructive sleep apnea) 08/28/2015  . Morbid obesity with BMI of 40.0-44.9, adult (HCC) 08/28/2015  . Benign essential HTN 05/18/2015    Past Medical History:  Diagnosis Date  . CAD (coronary artery disease)    07/2007  . Diabetes (HCC)   . Elevated lipids   . Glaucoma   . HBP (high blood pressure)   . Neuropathy   . Osteoarthritis   . Skin cancer   . Sleep apnea    CPAP    Past Surgical History:  Procedure Laterality Date  . APPENDECTOMY    . CARDIAC CATHETERIZATION N/A 06/11/2016   Procedure: Left Heart Cath and Coronary Angiography;  Surgeon: Lamar Blinks, MD;  Location: [facility];  Service: Cardiovascular;  Laterality: N/A;  . CARPAL TUNNEL RELEASE Left 04/24/2016   Procedure: CARPAL TUNNEL RELEASE;  Surgeon: Kennedy Bucker, MD;  Location: [facility] ORS;  Service: Orthopedics;  Laterality: Left;  . CATARACT EXTRACTION W/ INTRAOCULAR LENS  IMPLANT, BILATERAL Bilateral   . CATARACT EXTRACTION, BILATERAL     R eye 07/16/12, L eye 08/13/12 - with lens implant in both eyes  . CORONARY ARTERY BYPASS GRAFT N/A 06/27/2016   Procedure: CORONARY ARTERY BYPASS GRAFTING times four using left internal mammary artery and right leg saphenous vein;  Surgeon: Kerin Perna, MD;  Location: [facility] OR;  Service: Open Heart Surgery;  Laterality: N/A;  . EYE SURGERY Bilateral   . JOINT REPLACEMENT    .  KNEE ARTHROPLASTY Left 02/11/2016   Procedure: COMPUTER ASSISTED TOTAL KNEE ARTHROPLASTY;  Surgeon: Donato Heinz, MD;  Location: [facility] ORS;  Service: Orthopedics;  Laterality: Left;  . KNEE ARTHROSCOPY Right   . LEFT HEART CATH AND CORS/GRAFTS ANGIOGRAPHY Left 01/13/2017   Procedure: Left Heart Cath and Cors/Grafts Angiography;  Surgeon: Lamar Blinks, MD;  Location: [facility];  Service: Cardiovascular;  Laterality: Left;  . TEE WITHOUT CARDIOVERSION N/A 06/27/2016   Procedure:  TRANSESOPHAGEAL ECHOCARDIOGRAM (TEE);  Surgeon: Kerin Perna, MD;  Location: [facility] OR;  Service: Open Heart Surgery;  Laterality: N/A;    Social History   Socioeconomic History  . Marital status: Married    Spouse name: Not on file  . Number of children: Not on file  . Years of education: Not on file  . Highest education level: Not on file  Occupational History  . Not on file  Social Needs  . Financial resource strain: Not on file  . Food insecurity:    Worry: Not on file    Inability: Not on file  . Transportation needs:    Medical: Not on file    Non-medical: Not on file  Tobacco Use  . Smoking status: Former Smoker    Packs/day: 1.00    Years: 16.00    Pack years: 16.00    Types: Cigarettes    Start date: 11/03/1969    Last attempt to quit: 07/22/1986    Years since quitting: 31.8  . Smokeless tobacco: Never Used  Substance and Sexual Activity  . Alcohol use: Not Currently    Alcohol/week: 0.0 oz    Comment: OCCASIONALLY  . Drug use: No  . Sexual activity: Never  Lifestyle  . Physical activity:    Days per week: Not on file    Minutes per session: Not on file  . Stress: Not on file  Relationships  . Social connections:    Talks on phone: Not on file    Gets together: Not on file    Attends religious service: Not on file    Active member of club or organization: Not on file    Attends meetings of clubs or organizations: Not on file    Relationship status: Not on file  . Intimate partner violence:    Fear of current or ex partner: Not on file    Emotionally abused: Not on file    Physically abused: Not on file    Forced sexual activity: Not on file  Other Topics Concern  . Not on file  Social History Narrative   Married.  Lives at home with wife.     Family History  Problem Relation Age of Onset  . Drug abuse Brother   . Hypertension Mother   . Lupus Mother   . Hypertension Father   . Diabetes Father   . Heart attack Father 70       died in his 16s    . Arthritis Maternal Grandmother   . Arthritis Maternal Grandfather   . Arthritis Paternal Grandmother   . Arthritis Paternal Grandfather   . Heart disease Brother     Allergies  Allergen Reactions  . Nitroglycerin Nausea Only and Other (See Comments)    Patches only - headache Patches only - headache  . Statins Other (See Comments)    BODY PAIN  Other reaction(s): Muscle Pain BODY PAIN   . Lipitor [Atorvastatin] Other (See Comments) and Rash    BODY PAIN  . Lisinopril  Other (See Comments) and Nausea Only    Light headed, dizzy Other reaction(s): Dizziness    Medication list reviewed and updated in full in [place] [facility].   GEN: No acute illnesses, no fevers, chills. GI: No n/v/d, eating normally Pulm: No SOB Interactive and getting along well at home.  Otherwise, ROS is as per the HPI.  Objective:   BP 118/74   Pulse 67   Temp 98.4 F (36.9 C) (Oral)   Ht 5' 10.25" (1.784 m)   Wt (!) 308 lb 8 oz (139.9 kg)   BMI 43.95 kg/m   GEN: WDWN, NAD, Non-toxic, A & O x 3 HEENT: Atraumatic, Normocephalic. Neck supple. No masses, No LAD. Ears and Nose: No external deformity. CV: RRR, No M/G/R. No JVD. No thrill. No extra heart sounds. PULM: CTA B, no wheezes, crackles, rhonchi. No retractions. No resp. distress. No accessory muscle use. EXTR: No c/c/e NEURO Normal gait.  PSYCH: Normally interactive. Conversant. Not depressed or anxious appearing.  Calm demeanor.   At the nape of the neck there are 2 areas that are somewhat indurated to palpation, the entire area crosses less than 1 inch across.  Beyond this there is no tenderness to palpation.  Laboratory and Imaging Data:  Assessment and Plan:   Abscess, neck  Controlled type 1 diabetes mellitus without complication (HCC)  Morbid obesity with BMI of 40.0-44.9, adult (HCC)  H/o MRSA with risk factors  Hot compresses up to 10 x a day MRSA treatment dosing of septra  Call back for f/u if  worsens  Follow-up: No follow-ups on file.  Meds ordered this encounter  Medications  . sulfamethoxazole-trimethoprim (BACTRIM DS,SEPTRA DS) 800-160 MG tablet    Sig: Take 2 tablets by mouth 2 (two) times daily.    Dispense:  40 tablet    Refill:  0   Signed,  Anum Palecek T. Caasi Giglia, MD   Allergies as of 05/24/2018      Reactions   Nitroglycerin Nausea Only, Other (See Comments)   Patches only - headache Patches only - headache   Statins Other (See Comments)   BODY PAIN Other reaction(s): Muscle Pain BODY PAIN   Lipitor [atorvastatin] Other (See Comments), Rash   BODY PAIN   Lisinopril Other (See Comments), Nausea Only   Light headed, dizzy Other reaction(s): Dizziness      Medication List        Accurate as of 05/24/18  1:50 PM. Always use your most recent med list.          aspirin 81 MG EC tablet Take 81 mg by mouth daily. Swallow whole.   cholecalciferol 1000 units tablet Commonly known as:  VITAMIN D Take 1,000 Units by mouth daily.   clopidogrel 75 MG tablet Commonly known as:  PLAVIX Take 1 tablet (75 mg total) by mouth daily.   docusate sodium 100 MG capsule Commonly known as:  COLACE Take 200 mg by mouth 2 (two) times daily.   fexofenadine 180 MG tablet Commonly known as:  ALLEGRA Take 180 mg by mouth daily.   furosemide 40 MG tablet Commonly known as:  LASIX Take 1 tablet (40 mg total) by mouth daily.   GLUCAGON EMERGENCY 1 MG injection Generic drug:  glucagon Inject 1 mg into the vein once as needed. Reported on 02/11/2016   insulin aspart 100 UNIT/ML injection Commonly known as:  NOVOLOG Inject 20 Units into the skin 3 (three) times daily.   insulin glargine 100 UNIT/ML injection Commonly known as:  LANTUS Inject 0.25 mLs (25 Units total) into the skin at bedtime.   isosorbide mononitrate 30 MG 24 hr tablet Commonly known as:  IMDUR Take 30 mg by mouth daily.   latanoprost 0.005 % ophthalmic solution Commonly known as:   XALATAN Place 1 drop into both eyes at bedtime.   metoprolol tartrate 25 MG tablet Commonly known as:  LOPRESSOR Take 25 mg by mouth 2 (two) times daily.   multivitamin capsule Take 1 capsule by mouth daily.   nitroGLYCERIN 0.4 MG SL tablet Commonly known as:  NITROSTAT Place 1 tablet (0.4 mg total) every 5 (five) minutes as needed under the tongue for chest pain.   potassium chloride SA 20 MEQ tablet Commonly known as:  K-DUR,KLOR-CON Take 1 tablet (20 mEq total) by mouth daily.   ranolazine 500 MG 12 hr tablet Commonly known as:  RANEXA Take 1 tablet (500 mg total) by mouth 2 (two) times daily.   rosuvastatin 20 MG tablet Commonly known as:  CRESTOR Take 1 tablet (20 mg total) by mouth daily at 6 PM.   sulfamethoxazole-trimethoprim 800-160 MG tablet Commonly known as:  BACTRIM DS,SEPTRA DS Take 2 tablets by mouth 2 (two) times daily.   Turmeric 500 MG Caps Take 1 tablet by mouth daily.   vitamin B-12 500 MCG tablet Commonly known as:  CYANOCOBALAMIN Take 500 mcg by mouth daily.   vitamin C 500 MG tablet Commonly known as:  ASCORBIC ACID Take 500 mg by mouth 2 (two) times daily.

## 2018-06-11 ENCOUNTER — Encounter: Payer: Self-pay | Admitting: Podiatry

## 2018-06-11 ENCOUNTER — Ambulatory Visit (INDEPENDENT_AMBULATORY_CARE_PROVIDER_SITE_OTHER): Payer: Medicare Other | Admitting: Podiatry

## 2018-06-11 DIAGNOSIS — B351 Tinea unguium: Secondary | ICD-10-CM | POA: Diagnosis not present

## 2018-06-11 DIAGNOSIS — E0842 Diabetes mellitus due to underlying condition with diabetic polyneuropathy: Secondary | ICD-10-CM

## 2018-06-11 DIAGNOSIS — M79676 Pain in unspecified toe(s): Secondary | ICD-10-CM

## 2018-06-14 DIAGNOSIS — I25708 Atherosclerosis of coronary artery bypass graft(s), unspecified, with other forms of angina pectoris: Secondary | ICD-10-CM | POA: Diagnosis not present

## 2018-06-14 DIAGNOSIS — I10 Essential (primary) hypertension: Secondary | ICD-10-CM | POA: Diagnosis not present

## 2018-06-14 DIAGNOSIS — I214 Non-ST elevation (NSTEMI) myocardial infarction: Secondary | ICD-10-CM | POA: Diagnosis not present

## 2018-06-14 DIAGNOSIS — I208 Other forms of angina pectoris: Secondary | ICD-10-CM | POA: Diagnosis not present

## 2018-06-14 DIAGNOSIS — I6523 Occlusion and stenosis of bilateral carotid arteries: Secondary | ICD-10-CM | POA: Diagnosis not present

## 2018-06-14 DIAGNOSIS — G4733 Obstructive sleep apnea (adult) (pediatric): Secondary | ICD-10-CM | POA: Diagnosis not present

## 2018-06-14 DIAGNOSIS — I252 Old myocardial infarction: Secondary | ICD-10-CM | POA: Diagnosis not present

## 2018-06-14 DIAGNOSIS — E782 Mixed hyperlipidemia: Secondary | ICD-10-CM | POA: Diagnosis not present

## 2018-06-14 NOTE — Progress Notes (Signed)
   SUBJECTIVE Patient with a history of diabetes mellitus presents to office today complaining of elongated, thickened nails that cause pain while ambulating in shoes. He is unable to trim his own nails. Patient is here for further evaluation and treatment.   Past Medical History:  Diagnosis Date  . CAD (coronary artery disease)    07/2007  . Diabetes ([place])   . Elevated lipids   . Glaucoma   . HBP (high blood pressure)   . Neuropathy   . Osteoarthritis   . Skin cancer   . Sleep apnea    CPAP    OBJECTIVE General Patient is awake, alert, and oriented x 3 and in no acute distress. Derm Skin is dry and supple bilateral. Negative open lesions or macerations. Remaining integument unremarkable. Nails are tender, long, thickened and dystrophic with subungual debris, consistent with onychomycosis, 1-5 bilateral with the exception of the left 2nd toe. No signs of infection noted. Vasc  DP and PT pedal pulses palpable bilaterally. Temperature gradient within normal limits.  Neuro Epicritic and protective threshold sensation diminished bilaterally.  Musculoskeletal Exam No symptomatic pedal deformities noted bilateral. Muscular strength within normal limits.  ASSESSMENT 1. Diabetes Mellitus w/ peripheral neuropathy 2. Onychomycosis of nail due to dermatophyte bilateral 3. Pain in foot bilateral 4. 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 Patient evaluated today. 2. Instructed to maintain good pedal hygiene and foot care. Stressed importance of controlling blood sugar.  3. Mechanical debridement of nails 1-5 bilaterally (with the exception of the left 2nd toe secondary to amputation) performed using a nail nipper. Filed with dremel without incident.  4. Return to clinic in 3 mos.     [name], DPM [facility]  Dr. [name], [place]                                        [place], [place] [address]                Office [phone]    Fax [phone]

## 2018-08-09 ENCOUNTER — Other Ambulatory Visit: Payer: Self-pay | Admitting: Primary Care

## 2018-08-09 DIAGNOSIS — I200 Unstable angina: Secondary | ICD-10-CM

## 2018-08-09 DIAGNOSIS — Z794 Long term (current) use of insulin: Principal | ICD-10-CM

## 2018-08-09 DIAGNOSIS — E118 Type 2 diabetes mellitus with unspecified complications: Secondary | ICD-10-CM

## 2018-08-09 NOTE — Telephone Encounter (Signed)
Last prescribed on 04/30/2017 and 09/16/2017   Last office visit on 04/20/2018

## 2018-09-17 ENCOUNTER — Ambulatory Visit (INDEPENDENT_AMBULATORY_CARE_PROVIDER_SITE_OTHER): Payer: Medicare Other | Admitting: Podiatry

## 2018-09-17 ENCOUNTER — Encounter: Payer: Self-pay | Admitting: Podiatry

## 2018-09-17 DIAGNOSIS — M79676 Pain in unspecified toe(s): Secondary | ICD-10-CM | POA: Diagnosis not present

## 2018-09-17 DIAGNOSIS — E0842 Diabetes mellitus due to underlying condition with diabetic polyneuropathy: Secondary | ICD-10-CM

## 2018-09-17 DIAGNOSIS — B351 Tinea unguium: Secondary | ICD-10-CM

## 2018-09-20 NOTE — Progress Notes (Signed)
   SUBJECTIVE Patient with a history of diabetes mellitus presents to office today complaining of elongated, thickened nails that cause pain while ambulating in shoes. He is unable to trim his own nails. Patient is here for further evaluation and treatment.   Past Medical History:  Diagnosis Date  . CAD (coronary artery disease)    07/2007  . Diabetes ([place])   . Elevated lipids   . Glaucoma   . HBP (high blood pressure)   . Neuropathy   . Osteoarthritis   . Skin cancer   . Sleep apnea    CPAP    OBJECTIVE General Patient is awake, alert, and oriented x 3 and in no acute distress. Derm Skin is dry and supple bilateral. Negative open lesions or macerations. Remaining integument unremarkable. Nails are tender, long, thickened and dystrophic with subungual debris, consistent with onychomycosis, 1-5 bilateral. No signs of infection noted. Vasc  DP and PT pedal pulses palpable bilaterally. Temperature gradient within normal limits.  Neuro Epicritic and protective threshold sensation diminished bilaterally.  Musculoskeletal Exam No symptomatic pedal deformities noted bilateral. Muscular strength within normal limits.  ASSESSMENT 1. Diabetes Mellitus w/ peripheral neuropathy 2. Onychomycosis of nail due to dermatophyte bilateral 3. Pain in foot bilateral  PLAN OF CARE 1. Patient evaluated today. 2. Instructed to maintain good pedal hygiene and foot care. Stressed importance of controlling blood sugar.  3. Mechanical debridement of nails 1-5 bilaterally performed using a nail nipper. Filed with dremel without incident.  4. Return to clinic in 3 mos.     [name], DPM [facility]  Dr. [name], [place]                                        [place], [place] [address]                Office [phone]  Fax [phone]

## 2018-10-18 DIAGNOSIS — Z6841 Body Mass Index (BMI) 40.0 and over, adult: Secondary | ICD-10-CM | POA: Diagnosis not present

## 2018-10-18 DIAGNOSIS — I6523 Occlusion and stenosis of bilateral carotid arteries: Secondary | ICD-10-CM | POA: Diagnosis not present

## 2018-10-18 DIAGNOSIS — R600 Localized edema: Secondary | ICD-10-CM | POA: Insufficient documentation

## 2018-10-18 DIAGNOSIS — E782 Mixed hyperlipidemia: Secondary | ICD-10-CM | POA: Diagnosis not present

## 2018-10-18 DIAGNOSIS — I10 Essential (primary) hypertension: Secondary | ICD-10-CM | POA: Diagnosis not present

## 2018-10-18 DIAGNOSIS — G4733 Obstructive sleep apnea (adult) (pediatric): Secondary | ICD-10-CM | POA: Diagnosis not present

## 2018-10-18 DIAGNOSIS — I25708 Atherosclerosis of coronary artery bypass graft(s), unspecified, with other forms of angina pectoris: Secondary | ICD-10-CM | POA: Diagnosis not present

## 2018-10-26 ENCOUNTER — Encounter: Payer: Self-pay | Admitting: Primary Care

## 2018-10-26 ENCOUNTER — Ambulatory Visit (INDEPENDENT_AMBULATORY_CARE_PROVIDER_SITE_OTHER): Payer: Medicare Other | Admitting: Primary Care

## 2018-10-26 VITALS — BP 116/68 | HR 67 | Temp 98.2°F | Ht 70.25 in | Wt 323.5 lb

## 2018-10-26 DIAGNOSIS — B9789 Other viral agents as the cause of diseases classified elsewhere: Secondary | ICD-10-CM

## 2018-10-26 DIAGNOSIS — I200 Unstable angina: Secondary | ICD-10-CM | POA: Diagnosis not present

## 2018-10-26 DIAGNOSIS — J069 Acute upper respiratory infection, unspecified: Secondary | ICD-10-CM | POA: Diagnosis not present

## 2018-10-26 NOTE — Progress Notes (Signed)
Subjective:    Patient ID: Lawrence [name], male    DOB: January 23, 1951, 67 y.o.   MRN: [id number]  HPI  Lawrence [name] is a 67 year old male with a history of type 1 diabetes, CAD, hypertension, GERD who presents today with a chief complaint of cough.  He also reports nasal congestion, fever of 99.0 last night. His symptoms began two days ago. He also endorses a parietal lobe headache for several days. He's been taking Dayquil, Mucinex, Alka-seltzer Cold, Tylenol. No fevers this morning, did not take Tylenol.    Review of Systems  Constitutional: Positive for fatigue and fever.  HENT: Positive for congestion. Negative for sinus pressure and sore throat.   Respiratory: Positive for cough.   Neurological: Positive for headaches.       Past Medical History:  Diagnosis Date  . CAD (coronary artery disease)    07/2007  . Diabetes ([place])   . Elevated lipids   . Glaucoma   . HBP (high blood pressure)   . Neuropathy   . Osteoarthritis   . Skin cancer   . Sleep apnea    CPAP     Social History   Socioeconomic History  . Marital status: Married    Spouse name: Not on file  . Number of children: Not on file  . Years of education: Not on file  . Highest education level: Not on file  Occupational History  . Not on file  Social Needs  . Financial resource strain: Not on file  . Food insecurity:    Worry: Not on file    Inability: Not on file  . Transportation needs:    Medical: Not on file    Non-medical: Not on file  Tobacco Use  . Smoking status: Former Smoker    Packs/day: 1.00    Years: 16.00    Pack years: 16.00    Types: Cigarettes    Start date: 11/03/1969    Last attempt to quit: 07/22/1986    Years since quitting: 32.2  . Smokeless tobacco: Never Used  Substance and Sexual Activity  . Alcohol use: Not Currently    Alcohol/week: 0.0 standard drinks    Comment: OCCASIONALLY  . Drug use: No  . Sexual activity: Never  Lifestyle  . Physical activity:    Days per week:  Not on file    Minutes per session: Not on file  . Stress: Not on file  Relationships  . Social connections:    Talks on phone: Not on file    Gets together: Not on file    Attends religious service: Not on file    Active member of club or organization: Not on file    Attends meetings of clubs or organizations: Not on file    Relationship status: Not on file  . Intimate partner violence:    Fear of current or ex partner: Not on file    Emotionally abused: Not on file    Physically abused: Not on file    Forced sexual activity: Not on file  Other Topics Concern  . Not on file  Social History Narrative   Married.  Lives at home with wife.     Past Surgical History:  Procedure Laterality Date  . APPENDECTOMY    . CARDIAC CATHETERIZATION N/A 06/11/2016   Procedure: Left Heart Cath and Coronary Angiography;  Surgeon: Corey Skains, MD;  Location: [facility];  Service: Cardiovascular;  Laterality: N/A;  . CARPAL TUNNEL  RELEASE Left 04/24/2016   Procedure: CARPAL TUNNEL RELEASE;  Surgeon: Hessie Knows, MD;  Location: [facility] ORS;  Service: Orthopedics;  Laterality: Left;  . CATARACT EXTRACTION W/ INTRAOCULAR LENS  IMPLANT, BILATERAL Bilateral   . CATARACT EXTRACTION, BILATERAL     R eye 07/16/12, L eye 08/13/12 - with lens implant in both eyes  . CORONARY ARTERY BYPASS GRAFT N/A 06/27/2016   Procedure: CORONARY ARTERY BYPASS GRAFTING times four using left internal mammary artery and right leg saphenous vein;  Surgeon: Ivin Poot, MD;  Location: [facility];  Service: Open Heart Surgery;  Laterality: N/A;  . EYE SURGERY Bilateral   . JOINT REPLACEMENT    . KNEE ARTHROPLASTY Left 02/11/2016   Procedure: COMPUTER ASSISTED TOTAL KNEE ARTHROPLASTY;  Surgeon: Dereck Leep, MD;  Location: [facility] ORS;  Service: Orthopedics;  Laterality: Left;  . KNEE ARTHROSCOPY Right   . LEFT HEART CATH AND CORS/GRAFTS ANGIOGRAPHY Left 01/13/2017   Procedure: Left Heart Cath and Cors/Grafts Angiography;   Surgeon: Corey Skains, MD;  Location: [facility] LAB;  Service: Cardiovascular;  Laterality: Left;  . TEE WITHOUT CARDIOVERSION N/A 06/27/2016   Procedure: TRANSESOPHAGEAL ECHOCARDIOGRAM (TEE);  Surgeon: Ivin Poot, MD;  Location: [facility];  Service: Open Heart Surgery;  Laterality: N/A;    Family History  Problem Relation Age of Onset  . Drug abuse Brother   . Hypertension Mother   . Lupus Mother   . Hypertension Father   . Diabetes Father   . Heart attack Father 12       died in his 60s  . Arthritis Maternal Grandmother   . Arthritis Maternal Grandfather   . Arthritis Paternal Grandmother   . Arthritis Paternal Grandfather   . Heart disease Brother     Allergies  Allergen Reactions  . Nitroglycerin Nausea Only and Other (See Comments)    Patches only - headache Patches only - headache  . Statins Other (See Comments)    BODY PAIN  Other reaction(s): Muscle Pain BODY PAIN   . Lipitor [Atorvastatin] Other (See Comments) and Rash    BODY PAIN  . Lisinopril Other (See Comments) and Nausea Only    Light headed, dizzy Other reaction(s): Dizziness    Current Outpatient Medications on File Prior to Visit  Medication Sig Dispense Refill  . aspirin 81 MG EC tablet Take 81 mg by mouth daily. Swallow whole.    . cholecalciferol (VITAMIN D) 1000 units tablet Take 1,000 Units by mouth daily.    . clopidogrel (PLAVIX) 75 MG tablet Take 1 tablet (75 mg total) by mouth daily. 30 tablet 0  . fexofenadine (ALLEGRA) 180 MG tablet Take 180 mg by mouth daily.    . furosemide (LASIX) 40 MG tablet Take 1 tablet (40 mg total) by mouth daily. 30 tablet 1  . glucagon (GLUCAGON EMERGENCY) 1 MG injection Inject 1 mg into the vein once as needed. Reported on 02/11/2016    . insulin aspart (NOVOLOG) 100 UNIT/ML injection Inject 20 Units into the skin 3 (three) times daily. (Patient taking differently: Inject 20 Units into the skin 3 (three) times daily with meals. ) 10 mL 5  . insulin  glargine (LANTUS) 100 UNIT/ML injection Inject 0.25 mLs (25 Units total) into the skin at bedtime. 30 mL 1  . isosorbide mononitrate (IMDUR) 30 MG 24 hr tablet Take 30 mg by mouth daily.    [name] LANTUS 100 UNIT/ML injection INJECT 24 UNITS UNDER THE SKIN AT BEDTIME 30 mL 4  .  latanoprost (XALATAN) 0.005 % ophthalmic solution Place 1 drop into both eyes at bedtime.    . metoprolol tartrate (LOPRESSOR) 25 MG tablet Take 25 mg by mouth 2 (two) times daily.    . Multiple Vitamin (MULTIVITAMIN) capsule Take 1 capsule by mouth daily.    . nitroGLYCERIN (NITROSTAT) 0.4 MG SL tablet DISSOLVE 1 TABLET UNDER THE TONGUE EVERY 5 MINUTES AS NEEDED FOR CHEST PAIN 2 tablet 6  . potassium chloride SA (K-DUR,KLOR-CON) 20 MEQ tablet Take 1 tablet (20 mEq total) by mouth daily. 30 tablet 1  . ranolazine (RANEXA) 500 MG 12 hr tablet Take 1 tablet (500 mg total) by mouth 2 (two) times daily. 60 tablet 0  . rosuvastatin (CRESTOR) 20 MG tablet Take 1 tablet (20 mg total) by mouth daily at 6 PM. 30 tablet 1  . sulfamethoxazole-trimethoprim (BACTRIM DS,SEPTRA DS) 800-160 MG tablet Take 2 tablets by mouth 2 (two) times daily. 40 tablet 0  . vitamin C (ASCORBIC ACID) 500 MG tablet Take 500 mg by mouth 2 (two) times daily.      No current facility-administered medications on file prior to visit.     BP 116/68   Pulse 67   Temp 98.2 F (36.8 C) (Oral)   Ht 5' 10.25" (1.784 m)   Wt (!) 323 lb 8 oz (146.7 kg)   SpO2 97%   BMI 46.09 kg/m    Objective:   Physical Exam  Constitutional: He appears well-nourished. He does not appear ill.  HENT:  Right Ear: Tympanic membrane and ear canal normal.  Left Ear: Tympanic membrane and ear canal normal.  Nose: Mucosal edema present. Right sinus exhibits no maxillary sinus tenderness and no frontal sinus tenderness. Left sinus exhibits no maxillary sinus tenderness and no frontal sinus tenderness.  Mouth/Throat: Oropharynx is clear and moist.  Neck: Neck supple.    Cardiovascular: Normal rate and regular rhythm.  Respiratory: Effort normal and breath sounds normal. He has no wheezes.  Skin: Skin is warm and dry.           Assessment & Plan:  URI:  Cough, low grade fever x once, nasal congestion, headache x 2 days. Exam today without cough; clear lungs today. Doesn't appear sickly. Suspect viral URI and will treat with conservative measures. Continue Dayquil and Mucinex. Discussed to limit extra Tylenol use if taking Dayquil. Fluids, rest, return precautions provided.  Pleas Koch, NP

## 2018-10-26 NOTE — Patient Instructions (Signed)
Your symptoms are representative of a viral illness which will resolve on its own over time. Our goal is to treat your symptoms in order to aid your body in the healing process and to make you more comfortable.   Continue Dayquil and Mucinex as needed. Do not exceed 3000 mg of Tylenol in 24 hours.  Ensure you are consuming 64 ounces of water daily.  Please notify me if you develop persistent fevers of 101, notice increased fatigue or weakness, and/or feel worse after 1 week of onset of symptoms.   It was a pleasure to see you today!

## 2018-11-04 ENCOUNTER — Ambulatory Visit (INDEPENDENT_AMBULATORY_CARE_PROVIDER_SITE_OTHER): Payer: Medicare Other | Admitting: Primary Care

## 2018-11-04 ENCOUNTER — Encounter: Payer: Self-pay | Admitting: Primary Care

## 2018-11-04 DIAGNOSIS — R519 Headache, unspecified: Secondary | ICD-10-CM | POA: Insufficient documentation

## 2018-11-04 DIAGNOSIS — G4452 New daily persistent headache (NDPH): Secondary | ICD-10-CM | POA: Diagnosis not present

## 2018-11-04 DIAGNOSIS — R51 Headache: Secondary | ICD-10-CM

## 2018-11-04 MED ORDER — METHYLPREDNISOLONE ACETATE 80 MG/ML IJ SUSP
80.0000 mg | Freq: Once | INTRAMUSCULAR | Status: AC
  Administered 2018-11-04: 80 mg via INTRAMUSCULAR

## 2018-11-04 NOTE — Addendum Note (Signed)
Addended by: Jacqualin Combes on: 11/04/2018 09:11 AM   Modules accepted: Orders

## 2018-11-04 NOTE — Assessment & Plan Note (Signed)
Daily for one month, waxes and wanes. Neuro exam today unremarkable. He is in no distress.  MR of the brain from August 2018 reviewed, no aneurysm or other structural abnormality noted. IM Depo Medrol 80 mg provided today. Discussed use of Excedrin Migraine; also recommended he schedule an appointment with his optometrist due to visual changes which could be contributing to headaches.  He will follow up if symptoms persist.

## 2018-11-04 NOTE — Patient Instructions (Signed)
Schedule an appointment with your eye doctor.  You can try taking Excedrin Migraine for your headaches.  Please notify me if your headaches persist despite evaluation with your eye doctor and treatment.  It was a pleasure to see you today!

## 2018-11-04 NOTE — Progress Notes (Signed)
Subjective:    Patient ID: Lawrence [name], male    DOB: May 06, 1951, 68 y.o.   MRN: [id number]  HPI  Lawrence [name] is a 68 year old male who presents today with a chief complaint of headache. History of migraines during his early teenage years, once on medication and is not sure if he took it daily.   His headache is located to the bilateral posterior parietal lobes which is constant and has been present for one month. He describes his pain as dull, sharp, achy, throbbing at various times. He's been taking Tylenol without improvement. He also reports blurry vision when reading, intermittent. Some photophobia. He denies nausea, vomiting, stiffness to his neck.  BP Readings from Last 3 Encounters:  11/04/18 114/70  10/26/18 116/68  05/24/18 118/74      Review of Systems  Eyes: Positive for photophobia and visual disturbance.  Respiratory: Negative for cough and shortness of breath.   Cardiovascular: Negative for chest pain.  Neurological: Positive for headaches. Negative for dizziness.       Past Medical History:  Diagnosis Date  . CAD (coronary artery disease)    07/2007  . Diabetes ([place])   . Elevated lipids   . Glaucoma   . HBP (high blood pressure)   . Neuropathy   . Osteoarthritis   . Skin cancer   . Sleep apnea    CPAP     Social History   Socioeconomic History  . Marital status: Married    Spouse name: Not on file  . Number of children: Not on file  . Years of education: Not on file  . Highest education level: Not on file  Occupational History  . Not on file  Social Needs  . Financial resource strain: Not on file  . Food insecurity:    Worry: Not on file    Inability: Not on file  . Transportation needs:    Medical: Not on file    Non-medical: Not on file  Tobacco Use  . Smoking status: Former Smoker    Packs/day: 1.00    Years: 16.00    Pack years: 16.00    Types: Cigarettes    Start date: 11/03/1969    Last attempt to quit: 07/22/1986    Years since  quitting: 32.3  . Smokeless tobacco: Never Used  Substance and Sexual Activity  . Alcohol use: Not Currently    Alcohol/week: 0.0 standard drinks    Comment: OCCASIONALLY  . Drug use: No  . Sexual activity: Never  Lifestyle  . Physical activity:    Days per week: Not on file    Minutes per session: Not on file  . Stress: Not on file  Relationships  . Social connections:    Talks on phone: Not on file    Gets together: Not on file    Attends religious service: Not on file    Active member of club or organization: Not on file    Attends meetings of clubs or organizations: Not on file    Relationship status: Not on file  . Intimate partner violence:    Fear of current or ex partner: Not on file    Emotionally abused: Not on file    Physically abused: Not on file    Forced sexual activity: Not on file  Other Topics Concern  . Not on file  Social History Narrative   Married.  Lives at home with wife.     Past Surgical History:  Procedure  Laterality Date  . APPENDECTOMY    . CARDIAC CATHETERIZATION N/A 06/11/2016   Procedure: Left Heart Cath and Coronary Angiography;  Surgeon: Corey Skains, MD;  Location: [facility];  Service: Cardiovascular;  Laterality: N/A;  . CARPAL TUNNEL RELEASE Left 04/24/2016   Procedure: CARPAL TUNNEL RELEASE;  Surgeon: Hessie Knows, MD;  Location: [facility];  Service: Orthopedics;  Laterality: Left;  . CATARACT EXTRACTION W/ INTRAOCULAR LENS  IMPLANT, BILATERAL Bilateral   . CATARACT EXTRACTION, BILATERAL     R eye 07/16/12, L eye 08/13/12 - with lens implant in both eyes  . CORONARY ARTERY BYPASS GRAFT N/A 06/27/2016   Procedure: CORONARY ARTERY BYPASS GRAFTING times four using left internal mammary artery and right leg saphenous vein;  Surgeon: Ivin Poot, MD;  Location: [facility];  Service: Open Heart Surgery;  Laterality: N/A;  . EYE SURGERY Bilateral   . JOINT REPLACEMENT    . KNEE ARTHROPLASTY Left 02/11/2016   Procedure: COMPUTER  ASSISTED TOTAL KNEE ARTHROPLASTY;  Surgeon: Dereck Leep, MD;  Location: [facility];  Service: Orthopedics;  Laterality: Left;  . KNEE ARTHROSCOPY Right   . LEFT HEART CATH AND CORS/GRAFTS ANGIOGRAPHY Left 01/13/2017   Procedure: Left Heart Cath and Cors/Grafts Angiography;  Surgeon: Corey Skains, MD;  Location: [facility];  Service: Cardiovascular;  Laterality: Left;  . TEE WITHOUT CARDIOVERSION N/A 06/27/2016   Procedure: TRANSESOPHAGEAL ECHOCARDIOGRAM (TEE);  Surgeon: Ivin Poot, MD;  Location: [facility];  Service: Open Heart Surgery;  Laterality: N/A;    Family History  Problem Relation Age of Onset  . Drug abuse Brother   . Hypertension Mother   . Lupus Mother   . Hypertension Father   . Diabetes Father   . Heart attack Father 77       died in his 39s  . Arthritis Maternal Grandmother   . Arthritis Maternal Grandfather   . Arthritis Paternal Grandmother   . Arthritis Paternal Grandfather   . Heart disease Brother     Allergies  Allergen Reactions  . Nitroglycerin Nausea Only and Other (See Comments)    Patches only - headache Patches only - headache  . Statins Other (See Comments)    BODY PAIN  Other reaction(s): Muscle Pain BODY PAIN   . Lipitor [Atorvastatin] Other (See Comments) and Rash    BODY PAIN  . Lisinopril Other (See Comments) and Nausea Only    Light headed, dizzy Other reaction(s): Dizziness    Current Outpatient Medications on File Prior to Visit  Medication Sig Dispense Refill  . aspirin 81 MG EC tablet Take 81 mg by mouth daily. Swallow whole.    . cholecalciferol (VITAMIN D) 1000 units tablet Take 1,000 Units by mouth daily.    . clopidogrel (PLAVIX) 75 MG tablet Take 1 tablet (75 mg total) by mouth daily. 30 tablet 0  . fexofenadine (ALLEGRA) 180 MG tablet Take 180 mg by mouth daily.    . furosemide (LASIX) 40 MG tablet Take 1 tablet (40 mg total) by mouth daily. 30 tablet 1  . glucagon (GLUCAGON EMERGENCY) 1 MG injection Inject 1  mg into the vein once as needed. Reported on 02/11/2016    . insulin aspart (NOVOLOG) 100 UNIT/ML injection Inject 20 Units into the skin 3 (three) times daily. (Patient taking differently: Inject 20 Units into the skin 3 (three) times daily with meals. ) 10 mL 5  . insulin glargine (LANTUS) 100 UNIT/ML injection Inject 0.25 mLs (25 Units  total) into the skin at bedtime. 30 mL 1  . isosorbide mononitrate (IMDUR) 30 MG 24 hr tablet Take 30 mg by mouth daily.    [name] LANTUS 100 UNIT/ML injection INJECT 24 UNITS UNDER THE SKIN AT BEDTIME 30 mL 4  . latanoprost (XALATAN) 0.005 % ophthalmic solution Place 1 drop into both eyes at bedtime.    . metoprolol tartrate (LOPRESSOR) 25 MG tablet Take 25 mg by mouth 2 (two) times daily.    . Multiple Vitamin (MULTIVITAMIN) capsule Take 1 capsule by mouth daily.    . nitroGLYCERIN (NITROSTAT) 0.4 MG SL tablet DISSOLVE 1 TABLET UNDER THE TONGUE EVERY 5 MINUTES AS NEEDED FOR CHEST PAIN 2 tablet 6  . potassium chloride SA (K-DUR,KLOR-CON) 20 MEQ tablet Take 1 tablet (20 mEq total) by mouth daily. 30 tablet 1  . ranolazine (RANEXA) 500 MG 12 hr tablet Take 1 tablet (500 mg total) by mouth 2 (two) times daily. 60 tablet 0  . rosuvastatin (CRESTOR) 20 MG tablet Take 1 tablet (20 mg total) by mouth daily at 6 PM. 30 tablet 1  . vitamin C (ASCORBIC ACID) 500 MG tablet Take 500 mg by mouth 2 (two) times daily.      No current facility-administered medications on file prior to visit.     BP 114/70   Pulse 63   Temp 98.2 F (36.8 C) (Oral)   Ht 5' 10.25" (1.784 m)   Wt (!) 319 lb (144.7 kg)   SpO2 97%   BMI 45.45 kg/m    Objective:   Physical Exam  Constitutional: He is oriented to person, place, and time. He appears well-nourished.  Eyes: EOM are normal.  Neck: Neck supple.  Cardiovascular: Normal rate and regular rhythm.  Respiratory: Effort normal and breath sounds normal.  Neurological: He is alert and oriented to person, place, and time. No cranial nerve  deficit.  Skin: Skin is warm and dry.           Assessment & Plan:

## 2018-11-11 ENCOUNTER — Telehealth: Payer: Self-pay | Admitting: Primary Care

## 2018-11-11 DIAGNOSIS — G4452 New daily persistent headache (NDPH): Secondary | ICD-10-CM

## 2018-11-11 MED ORDER — CYCLOBENZAPRINE HCL 10 MG PO TABS: 5.0000 mg | ORAL_TABLET | Freq: Every evening | ORAL | 0 refills | Status: DC | PRN

## 2018-11-11 NOTE — Telephone Encounter (Signed)
Left a detailed message on VM per DPR.

## 2018-11-11 NOTE — Telephone Encounter (Signed)
Pt called office to let [facility] know he was seen by the ophthalmologist and he was told his eyes has no relation to the head aches he is having. Pt wants to know what further steps to take.

## 2018-11-11 NOTE — Telephone Encounter (Signed)
Please notify patient that the next step would be a CT scan of his head, I will place the order. I am also going to send the medication called cyclobenzaprine (Flexeril) to his pharmacy.  Have him take 1 tablet by mouth at bedtime as needed for headache, cautioned this may cause drowsiness.  I will be in touch once his results return.

## 2018-11-19 ENCOUNTER — Ambulatory Visit
Admission: RE | Admit: 2018-11-19 | Discharge: 2018-11-19 | Disposition: A | Discharge status: Home / Self Care | Payer: Medicare Other | Source: Ambulatory Visit | Attending: Primary Care | Admitting: Primary Care

## 2018-11-19 DIAGNOSIS — R51 Headache: Secondary | ICD-10-CM | POA: Diagnosis not present

## 2018-11-19 DIAGNOSIS — G4452 New daily persistent headache (NDPH): Secondary | ICD-10-CM | POA: Diagnosis not present

## 2018-11-19 DIAGNOSIS — H538 Other visual disturbances: Secondary | ICD-10-CM | POA: Diagnosis not present

## 2018-12-17 ENCOUNTER — Ambulatory Visit (INDEPENDENT_AMBULATORY_CARE_PROVIDER_SITE_OTHER): Payer: Medicare Other | Admitting: Podiatry

## 2018-12-17 ENCOUNTER — Encounter: Payer: Self-pay | Admitting: Podiatry

## 2018-12-17 DIAGNOSIS — E0842 Diabetes mellitus due to underlying condition with diabetic polyneuropathy: Secondary | ICD-10-CM | POA: Diagnosis not present

## 2018-12-17 DIAGNOSIS — M79676 Pain in unspecified toe(s): Secondary | ICD-10-CM | POA: Diagnosis not present

## 2018-12-17 DIAGNOSIS — B351 Tinea unguium: Secondary | ICD-10-CM | POA: Diagnosis not present

## 2018-12-20 NOTE — Progress Notes (Signed)
   SUBJECTIVE Patient with a history of diabetes mellitus presents to office today complaining of elongated, thickened nails that cause pain while ambulating in shoes. He is unable to trim his own nails. He denies any new complaints at this time. Patient is here for further evaluation and treatment.   Past Medical History:  Diagnosis Date  . CAD (coronary artery disease)    07/2007  . Diabetes ([place])   . Elevated lipids   . Glaucoma   . HBP (high blood pressure)   . Neuropathy   . Osteoarthritis   . Skin cancer   . Sleep apnea    CPAP    OBJECTIVE General Patient is awake, alert, and oriented x 3 and in no acute distress. Derm Skin is dry and supple bilateral. Negative open lesions or macerations. Remaining integument unremarkable. Nails are tender, long, thickened and dystrophic with subungual debris, consistent with onychomycosis, 1-5 bilateral. No signs of infection noted. Vasc  DP and PT pedal pulses palpable bilaterally. Temperature gradient within normal limits.  Neuro Epicritic and protective threshold sensation diminished bilaterally.  Musculoskeletal Exam No symptomatic pedal deformities noted bilateral. Muscular strength within normal limits.  ASSESSMENT 1. Diabetes Mellitus w/ peripheral neuropathy 2. Onychomycosis of nail due to dermatophyte bilateral 3. Pain in foot bilateral  PLAN OF CARE 1. Patient evaluated today. 2. Instructed to maintain good pedal hygiene and foot care. Stressed importance of controlling blood sugar.  3. Mechanical debridement of nails 1-5 bilaterally performed using a nail nipper. Filed with dremel without incident.  4. Return to clinic in 3 mos.     [name], DPM [facility]  Dr. [name], [address]                                        [place], [place] [address]                Office [phone]  Fax [phone]

## 2019-02-18 ENCOUNTER — Other Ambulatory Visit: Payer: Self-pay

## 2019-02-18 ENCOUNTER — Encounter: Payer: Self-pay | Admitting: Podiatry

## 2019-02-18 ENCOUNTER — Ambulatory Visit (INDEPENDENT_AMBULATORY_CARE_PROVIDER_SITE_OTHER): Payer: Medicare Other | Admitting: Podiatry

## 2019-02-18 VITALS — Temp 99.2°F

## 2019-02-18 DIAGNOSIS — E0842 Diabetes mellitus due to underlying condition with diabetic polyneuropathy: Secondary | ICD-10-CM | POA: Diagnosis not present

## 2019-02-18 DIAGNOSIS — R600 Localized edema: Secondary | ICD-10-CM | POA: Diagnosis not present

## 2019-02-18 MED ORDER — GENTAMICIN SULFATE 0.1 % EX CREA: 1.0000 "application " | TOPICAL_CREAM | Freq: Three times a day (TID) | CUTANEOUS | 1 refills | Status: DC

## 2019-02-18 NOTE — Progress Notes (Signed)
   HPI:   Past Medical History:  Diagnosis Date  . CAD (coronary artery disease)    07/2007  . Diabetes ([place])   . Elevated lipids   . Glaucoma   . HBP (high blood pressure)   . Neuropathy   . Osteoarthritis   . Skin cancer   . Sleep apnea    CPAP     Physical Exam: General: The patient is alert and oriented x3 in no acute distress.  Dermatology: Skin is warm, dry and supple bilateral lower extremities. Negative for open lesions or macerations.  Vascular: Palpable pedal pulses bilaterally. No erythema noted. Capillary refill within normal limits.  Pitting bilateral lower extremity edema noted up to the level of the leg with varicosities.  Neurological: Epicritic and protective threshold diminished bilaterally.   Musculoskeletal Exam: Range of motion within normal limits to all pedal and ankle joints bilateral. Muscle strength 5/5 in all groups bilateral.   Assessment: 1.  Bilateral lower extremity edema 2.  T2DM w/ polyneuropathy   Plan of Care:  1. Patient evaluated. 2.  Recommended to the patient that he needs to resume compression socks up to the level of the knee.  Patient has a pair at home. 3.  Continue wearing diabetic shoes 4.  Return to clinic for next scheduled routine care visit      [name], DPM [facility]  Dr. [name], DPM    [address], [place] [address]                Office [phone]  Fax [phone]

## 2019-02-22 DIAGNOSIS — Z96652 Presence of left artificial knee joint: Secondary | ICD-10-CM | POA: Diagnosis not present

## 2019-02-22 DIAGNOSIS — Z6841 Body Mass Index (BMI) 40.0 and over, adult: Secondary | ICD-10-CM | POA: Diagnosis not present

## 2019-02-22 DIAGNOSIS — M1712 Unilateral primary osteoarthritis, left knee: Secondary | ICD-10-CM | POA: Diagnosis not present

## 2019-03-18 ENCOUNTER — Ambulatory Visit (INDEPENDENT_AMBULATORY_CARE_PROVIDER_SITE_OTHER): Payer: Medicare Other | Admitting: Podiatry

## 2019-03-18 ENCOUNTER — Encounter: Payer: Self-pay | Admitting: Podiatry

## 2019-03-18 ENCOUNTER — Other Ambulatory Visit: Payer: Self-pay

## 2019-03-18 ENCOUNTER — Ambulatory Visit: Payer: Medicare Other | Admitting: Podiatry

## 2019-03-18 VITALS — Temp 97.7°F

## 2019-03-18 DIAGNOSIS — M79676 Pain in unspecified toe(s): Secondary | ICD-10-CM

## 2019-03-18 DIAGNOSIS — B351 Tinea unguium: Secondary | ICD-10-CM | POA: Diagnosis not present

## 2019-03-18 DIAGNOSIS — E0842 Diabetes mellitus due to underlying condition with diabetic polyneuropathy: Secondary | ICD-10-CM

## 2019-03-18 DIAGNOSIS — L989 Disorder of the skin and subcutaneous tissue, unspecified: Secondary | ICD-10-CM | POA: Diagnosis not present

## 2019-03-18 DIAGNOSIS — R600 Localized edema: Secondary | ICD-10-CM

## 2019-03-21 NOTE — Progress Notes (Signed)
    Subjective: Patient is a 68 y.o. male presenting to the office today with a chief complaint of painful callus lesions noted to the bilateral feet that have been present for the past several months. Walking and bearing weight increases the pain. He has not done anything for treatment.  Patient also complains of elongated, thickened nails that cause pain while ambulating in shoes. He is unable to trim his own nails. Patient presents today for further treatment and evaluation.  Past Medical History:  Diagnosis Date  . CAD (coronary artery disease)    07/2007  . Diabetes ([place])   . Elevated lipids   . Glaucoma   . HBP (high blood pressure)   . Neuropathy   . Osteoarthritis   . Skin cancer   . Sleep apnea    CPAP    Objective:  Physical Exam General: Alert and oriented x3 in no acute distress  Dermatology: Hyperkeratotic lesions present on the bilateral feet. Pain on palpation with a central nucleated core noted. Skin is warm, dry and supple bilateral lower extremities. Negative for open lesions or macerations. Nails are tender, long, thickened and dystrophic with subungual debris, consistent with onychomycosis, 1-5 right, 1,3-4 left. No signs of infection noted.  Vascular: Palpable pedal pulses bilaterally. No edema or erythema noted. Capillary refill within normal limits.  Neurological: Epicritic and protective threshold diminished bilaterally.   Musculoskeletal Exam: Pain on palpation at the keratotic lesions noted. Range of motion within normal limits bilateral. Muscle strength 5/5 in all groups bilateral.  Assessment: 1. Onychodystrophic nails 1-5 bilateral with hyperkeratosis of nails.  2. Onychomycosis of nail due to dermatophyte bilateral 3. Pre-ulcerative callus lesions noted to the bilateral feet   Plan of Care:  1. Patient evaluated. 2. Excisional debridement of keratoic lesion using a chisel blade was performed without incident.  3. Dressed with light dressing. 4.  Mechanical debridement of nails 1-5 bilaterally performed using a nail nipper. Filed with dremel without incident.  5. Patient is to return to the clinic in 3 months.   [name], DPM [facility]  Dr. [name], [place]                                        [place], [place] [address]                Office [phone]  Fax [phone]

## 2019-04-15 ENCOUNTER — Other Ambulatory Visit: Payer: Self-pay | Admitting: Primary Care

## 2019-04-15 DIAGNOSIS — I10 Essential (primary) hypertension: Secondary | ICD-10-CM

## 2019-04-15 DIAGNOSIS — E785 Hyperlipidemia, unspecified: Secondary | ICD-10-CM

## 2019-04-15 DIAGNOSIS — E109 Type 1 diabetes mellitus without complications: Secondary | ICD-10-CM

## 2019-04-22 ENCOUNTER — Other Ambulatory Visit: Payer: Self-pay

## 2019-04-22 ENCOUNTER — Other Ambulatory Visit (INDEPENDENT_AMBULATORY_CARE_PROVIDER_SITE_OTHER): Payer: Medicare Other

## 2019-04-22 DIAGNOSIS — E109 Type 1 diabetes mellitus without complications: Secondary | ICD-10-CM | POA: Diagnosis not present

## 2019-04-22 DIAGNOSIS — I10 Essential (primary) hypertension: Secondary | ICD-10-CM

## 2019-04-22 DIAGNOSIS — E785 Hyperlipidemia, unspecified: Secondary | ICD-10-CM | POA: Diagnosis not present

## 2019-04-22 LAB — LIPID PANEL
Cholesterol: 136 mg/dL (ref 0–200)
HDL: 64.1 mg/dL (ref >39.00)
LDL Cholesterol: 61 mg/dL (ref 0–99)
NonHDL: 71.97
Total CHOL/HDL Ratio: 2
Triglycerides: 53 mg/dL (ref 0.0–149.0)
VLDL: 10.6 mg/dL (ref 0.0–40.0)

## 2019-04-22 LAB — COMPREHENSIVE METABOLIC PANEL
ALT: 12 U/L (ref 0–53)
AST: 12 U/L (ref 0–37)
Albumin: 3.8 g/dL (ref 3.5–5.2)
Alkaline Phosphatase: 100 U/L (ref 39–117)
BUN: 25 mg/dL — ABNORMAL HIGH (ref 6–23)
CO2: 30 mEq/L (ref 19–32)
Calcium: 8.9 mg/dL (ref 8.4–10.5)
Chloride: 102 mEq/L (ref 96–112)
Creatinine, Ser: 1.23 mg/dL (ref 0.40–1.50)
GFR: 58.12 mL/min — ABNORMAL LOW (ref >60.00)
Glucose, Bld: 194 mg/dL — ABNORMAL HIGH (ref 70–99)
Potassium: 4.3 mEq/L (ref 3.5–5.1)
Sodium: 139 mEq/L (ref 135–145)
Total Bilirubin: 0.5 mg/dL (ref 0.2–1.2)
Total Protein: 6.5 g/dL (ref 6.0–8.3)

## 2019-04-22 LAB — HEMOGLOBIN A1C: Hgb A1c MFr Bld: 7.8 % — ABNORMAL HIGH (ref 4.6–6.5)

## 2019-04-22 LAB — MICROALBUMIN / CREATININE URINE RATIO
Creatinine,U: 159.2 mg/dL
Microalb Creat Ratio: 0.4 mg/g (ref 0.0–30.0)
Microalb, Ur: <0.7 mg/dL (ref 0.0–1.9)

## 2019-04-25 ENCOUNTER — Other Ambulatory Visit: Payer: Self-pay

## 2019-04-25 ENCOUNTER — Encounter: Payer: Self-pay | Admitting: Primary Care

## 2019-04-25 ENCOUNTER — Ambulatory Visit: Payer: Medicare Other

## 2019-04-25 ENCOUNTER — Encounter: Payer: Medicare Other | Admitting: Primary Care

## 2019-04-25 ENCOUNTER — Ambulatory Visit (INDEPENDENT_AMBULATORY_CARE_PROVIDER_SITE_OTHER): Payer: Medicare Other | Admitting: Primary Care

## 2019-04-25 VITALS — BP 114/70 | HR 63 | Temp 98.2°F | Ht 70.25 in | Wt 320.8 lb

## 2019-04-25 DIAGNOSIS — E782 Mixed hyperlipidemia: Secondary | ICD-10-CM | POA: Diagnosis not present

## 2019-04-25 DIAGNOSIS — E785 Hyperlipidemia, unspecified: Secondary | ICD-10-CM

## 2019-04-25 DIAGNOSIS — Z0000 Encounter for general adult medical examination without abnormal findings: Secondary | ICD-10-CM | POA: Diagnosis not present

## 2019-04-25 DIAGNOSIS — M159 Polyosteoarthritis, unspecified: Secondary | ICD-10-CM | POA: Diagnosis not present

## 2019-04-25 DIAGNOSIS — I25119 Atherosclerotic heart disease of native coronary artery with unspecified angina pectoris: Secondary | ICD-10-CM | POA: Diagnosis not present

## 2019-04-25 DIAGNOSIS — E109 Type 1 diabetes mellitus without complications: Secondary | ICD-10-CM | POA: Diagnosis not present

## 2019-04-25 DIAGNOSIS — R600 Localized edema: Secondary | ICD-10-CM | POA: Diagnosis not present

## 2019-04-25 DIAGNOSIS — I10 Essential (primary) hypertension: Secondary | ICD-10-CM

## 2019-04-25 DIAGNOSIS — Z6841 Body Mass Index (BMI) 40.0 and over, adult: Secondary | ICD-10-CM

## 2019-04-25 DIAGNOSIS — G4733 Obstructive sleep apnea (adult) (pediatric): Secondary | ICD-10-CM | POA: Diagnosis not present

## 2019-04-25 NOTE — Progress Notes (Signed)
Patient ID: Lawrence [name], male   DOB: 13-Jun-1951, 68 y.o.   MRN: [id number]  HPI: Lawrence [name] is a 68 year old male who presents today for MWV.  Past Medical History:  Diagnosis Date  . CAD (coronary artery disease)    07/2007  . Diabetes (H[facility])   . Elevated lipids   . Glaucoma   . HBP (high blood pressure)   . Neuropathy   . Osteoarthritis   . Skin cancer   . Sleep apnea    CPAP    Current Outpatient Medications  Medication Sig Dispense Refill  . aspirin 81 MG EC tablet Take 81 mg by mouth daily. Swallow whole.    . cholecalciferol (VITAMIN D) 1000 units tablet Take 1,000 Units by mouth daily.    . clopidogrel (PLAVIX) 75 MG tablet Take 1 tablet (75 mg total) by mouth daily. 30 tablet 0  . cyclobenzaprine (FLEXERIL) 10 MG tablet Take 0.5-1 tablets (5-10 mg total) by mouth at bedtime as needed (headache). 20 tablet 0  . fexofenadine (ALLEGRA) 180 MG tablet Take 180 mg by mouth daily.    . furosemide (LASIX) 40 MG tablet Take 1 tablet (40 mg total) by mouth daily. 30 tablet 1  . gentamicin cream (GARAMYCIN) 0.1 % Apply 1 application topically 3 (three) times daily. 30 g 1  . glucagon (GLUCAGON EMERGENCY) 1 MG injection Inject 1 mg into the vein once as needed. Reported on 02/11/2016    . insulin aspart (NOVOLOG) 100 UNIT/ML injection Inject 20 Units into the skin 3 (three) times daily. (Patient taking differently: Inject 20 Units into the skin 3 (three) times daily with meals. ) 10 mL 5  . insulin glargine (LANTUS) 100 UNIT/ML injection Inject 0.25 mLs (25 Units total) into the skin at bedtime. 30 mL 1  . isosorbide mononitrate (IMDUR) 30 MG 24 hr tablet Take 30 mg by mouth daily.    [name] LANTUS 100 UNIT/ML injection INJECT 24 UNITS UNDER THE SKIN AT BEDTIME 30 mL 4  . latanoprost (XALATAN) 0.005 % ophthalmic solution Place 1 drop into both eyes at bedtime.    . metoprolol tartrate (LOPRESSOR) 25 MG tablet Take 25 mg by mouth 2 (two) times daily.    . Multiple Vitamin (MULTIVITAMIN)  capsule Take 1 capsule by mouth daily.    . nitroGLYCERIN (NITROSTAT) 0.4 MG SL tablet DISSOLVE 1 TABLET UNDER THE TONGUE EVERY 5 MINUTES AS NEEDED FOR CHEST PAIN 2 tablet 6  . potassium chloride SA (K-DUR,KLOR-CON) 20 MEQ tablet Take 1 tablet (20 mEq total) by mouth daily. 30 tablet 1  . ranolazine (RANEXA) 500 MG 12 hr tablet Take 1 tablet (500 mg total) by mouth 2 (two) times daily. 60 tablet 0  . rosuvastatin (CRESTOR) 20 MG tablet Take 1 tablet (20 mg total) by mouth daily at 6 PM. 30 tablet 1  . vitamin C (ASCORBIC ACID) 500 MG tablet Take 500 mg by mouth 2 (two) times daily.      No current facility-administered medications for this visit.     Allergies  Allergen Reactions  . Nitroglycerin Nausea Only and Other (See Comments)    Patches only - headache Patches only - headache  . Statins Other (See Comments)    BODY PAIN  Other reaction(s): Muscle Pain BODY PAIN   . Lipitor [Atorvastatin] Other (See Comments) and Rash    BODY PAIN  . Lisinopril Other (See Comments) and Nausea Only    Light headed, dizzy Other reaction(s): Dizziness  Family History  Problem Relation Age of Onset  . Drug abuse Brother   . Hypertension Mother   . Lupus Mother   . Hypertension Father   . Diabetes Father   . Heart attack Father 24       died in his 1s  . Arthritis Maternal Grandmother   . Arthritis Maternal Grandfather   . Arthritis Paternal Grandmother   . Arthritis Paternal Grandfather   . Heart disease Brother     Social History   Socioeconomic History  . Marital status: Married    Spouse name: Not on file  . Number of children: Not on file  . Years of education: Not on file  . Highest education level: Not on file  Occupational History  . Not on file  Social Needs  . Financial resource strain: Not on file  . Food insecurity    Worry: Not on file    Inability: Not on file  . Transportation needs    Medical: Not on file    Non-medical: Not on file  Tobacco Use  .  Smoking status: Former Smoker    Packs/day: 1.00    Years: 16.00    Pack years: 16.00    Types: Cigarettes    Start date: 11/03/1969    Quit date: 07/22/1986    Years since quitting: 32.7  . Smokeless tobacco: Never Used  Substance and Sexual Activity  . Alcohol use: Not Currently    Alcohol/week: 0.0 standard drinks    Comment: OCCASIONALLY  . Drug use: No  . Sexual activity: Never  Lifestyle  . Physical activity    Days per week: Not on file    Minutes per session: Not on file  . Stress: Not on file  Relationships  . Social Musician on phone: Not on file    Gets together: Not on file    Attends religious service: Not on file    Active member of club or organization: Not on file    Attends meetings of clubs or organizations: Not on file    Relationship status: Not on file  . Intimate partner violence    Fear of current or ex partner: Not on file    Emotionally abused: Not on file    Physically abused: Not on file    Forced sexual activity: Not on file  Other Topics Concern  . Not on file  Social History Narrative   Married.  Lives at home with wife.     Hospitiliaztions: None  Health Maintenance:    Flu: Completed last season   Tetanus: Unsure, believes it's been over 10 years.  Pneumovax: Completed in 2017  Prevnar: Unsure.  Shingrix/Zostavax: Completed through the [place].  Colonoscopy: Completed Cologuard in November 2017  Eye Doctor: Completes semi-annually   Dental Exam: Completes semi-annually   PSA: 0.23 in 2019  Hep C Screening: Negative    Providers: Follows through the [place] for most of his medical care. Podiatry- Dr. Logan Bores; Ortho-Dr. Ernest Pine; Cardiology-Dr. Gwen Pounds; PCP-Nikhil Osei [facility] Spore   I have personally reviewed and have noted: 1. The patient's medical and social history 2. Their use of alcohol, tobacco or illicit drugs 3. Their current medications and supplements 4. The patient's functional ability including ADL's, fall risks, home  safety  risks and hearing or visual impairment. 5. Diet and physical activities 6. Evidence for depression or mood disorder  Subjective:   Review of Systems:   Constitutional: Denies fever, malaise, fatigue, headache or abrupt weight  changes.  HEENT: Denies eye pain, eye redness, ear pain, ringing in the ears, wax buildup, runny nose, nasal congestion, bloody nose, or sore throat. Respiratory: Denies difficulty breathing, shortness of breath, cough or sputum production.   Cardiovascular: Denies chest pain, chest tightness, palpitations. Chronic lower extremity edema bilaterally. Waxes and wanes. Gastrointestinal: Denies abdominal pain, bloating, constipation, diarrhea or blood in the stool.  GU: Denies urgency, frequency, pain with urination, burning sensation, blood in urine, odor or discharge. Musculoskeletal: Denies decrease in range of motion, overall stable. Skin: Denies redness, rashes, lesions or ulcercations.  Neurological: Denies dizziness, difficulty with memory, difficulty with speech or problems. Uses a cane for ambulation  Psychiatric: Denies concerns for anxiety or depression.   No other specific complaints in a complete review of systems (except as listed in HPI above).  Objective:  PE:   BP 114/70   Pulse 63   Temp 98.2 F (36.8 C) (Oral)   Ht 5' 10.25" (1.784 m)   Wt (!) 320 lb 12 oz (145.5 kg)   SpO2 96%   BMI 45.70 kg/m  Wt Readings from Last 3 Encounters:  04/25/19 (!) 320 lb 12 oz (145.5 kg)  11/04/18 (!) 319 lb (144.7 kg)  10/26/18 (!) 323 lb 8 oz (146.7 kg)    General: Appears their stated age, well developed, well nourished in NAD. Skin: Warm, dry and intact. No rashes, lesions or ulcerations noted. HEENT: Head: normal shape and size; Eyes: sclera white, no icterus, conjunctiva pink, PERRLA and EOMs intact; Ears: Tm's gray and intact, normal light reflex; Nose: mucosa pink and moist, septum midline; Throat/Mouth: Teeth present, mucosa pink and moist, no  exudate, lesions or ulcerations noted.  Neck: Normal range of motion. Neck supple, trachea midline. No massses, lumps or thyromegaly present.  Cardiovascular: Normal rate and rhythm. S1,S2 noted.  No murmur, rubs or gallops noted. No JVD or BLE edema. No carotid bruits noted. Pulmonary/Chest: Normal effort and positive vesicular breath sounds. No respiratory distress. No wheezes, rales or ronchi noted.  Abdomen: Soft and nontender. Normal bowel sounds, no bruits noted. No distention or masses noted. Liver, spleen and kidneys non palpable. Musculoskeletal: Stable gait with cane. Neurological: Alert and oriented. Cranial nerves II-XII intact. Coordination normal. +DTRs bilaterally. Psychiatric: Mood and affect normal. Behavior is normal. Judgment and thought content normal.     BMET    Component Value Date/Time   NA 139 04/22/2019 0801   NA 137 10/12/2013 0515   K 4.3 04/22/2019 0801   K 3.9 10/12/2013 0515   CL 102 04/22/2019 0801   CL 101 10/12/2013 0515   CO2 30 04/22/2019 0801   CO2 31 10/12/2013 0515   GLUCOSE 194 (H) 04/22/2019 0801   GLUCOSE 218 (H) 10/12/2013 0515   BUN 25 (H) 04/22/2019 0801   BUN 20 (H) 10/12/2013 0515   CREATININE 1.23 04/22/2019 0801   CREATININE 0.99 10/12/2013 0515   CALCIUM 8.9 04/22/2019 0801   CALCIUM 8.8 10/12/2013 0515   GFRNONAA >60 04/23/2017 0937   GFRNONAA >60 10/12/2013 0515   GFRAA >60 04/23/2017 0937   GFRAA >60 10/12/2013 0515    Lipid Panel     Component Value Date/Time   CHOL 136 04/22/2019 0801   CHOL 167 10/11/2013 0606   TRIG 53.0 04/22/2019 0801   TRIG 53 10/11/2013 0606   HDL 64.10 04/22/2019 0801   HDL 77 (H) 10/11/2013 0606   CHOLHDL 2 04/22/2019 0801   VLDL 10.6 04/22/2019 0801   VLDL 11 10/11/2013 0606  LDLCALC 61 04/22/2019 0801   LDLCALC 79 10/11/2013 0606    CBC    Component Value Date/Time   WBC 6.5 04/19/2018 1032   RBC 4.11 (L) 04/19/2018 1032   HGB 13.4 04/19/2018 1032   HGB 13.1 11/24/2017 1204    HCT 38.4 (L) 04/19/2018 1032   HCT 39.5 11/24/2017 1204   PLT 233.0 04/19/2018 1032   PLT 256 11/24/2017 1204   MCV 93.3 04/19/2018 1032   MCV 95 11/24/2017 1204   MCV 95 10/12/2013 0515   MCH 31.4 11/24/2017 1204   MCH 31.8 04/23/2017 0937   MCHC 34.9 04/19/2018 1032   RDW 12.8 04/19/2018 1032   RDW 13.2 11/24/2017 1204   RDW 13.1 10/12/2013 0515   LYMPHSABS 1.8 04/19/2018 1032   LYMPHSABS 1.9 11/24/2017 1204   LYMPHSABS 2.3 10/12/2013 0515   MONOABS 0.5 04/19/2018 1032   MONOABS 0.7 10/12/2013 0515   EOSABS 0.1 04/19/2018 1032   EOSABS 0.2 11/24/2017 1204   EOSABS 0.2 10/12/2013 0515   BASOSABS 0.0 04/19/2018 1032   BASOSABS 0.0 11/24/2017 1204   BASOSABS 0.0 10/12/2013 0515    Hgb A1C Lab Results  Component Value Date   HGBA1C 7.8 (H) 04/22/2019      Assessment and Plan:   Medicare Annual Wellness Visit:  Diet: He endorses a fair diet. Breakfast: Cereal, oatmeal Lunch: Left overs, frozen dinner, salad Dinner: Meat, vegetables, starch Snacks: Crackers Desserts: Daily  Beverages: Coffee, green decaf unsweet tea, little water Physical activity: Sedentary Depression/mood screen: Negative Hearing: Intact to whispered voice Visual acuity: Grossly normal, performs annual eye exam  ADLs: Capable Fall risk: None Home safety: Good Cognitive evaluation: Intact to orientation, naming, recall and repetition EOL planning: Adv directives, full code/ I agree  Preventative Medicine: Immunizations UTD per patient. He will send [place] dates from the [place]. Colon cancer screening UTD. PSA UTD. Exam stable. Labs reviewed. Encouraged some form of exercise, work on diet. All recommendations provided at end of visit. Continue following with cardiology, podiatry, and endocrinology.   Next appointment: One year.

## 2019-04-25 NOTE — Patient Instructions (Signed)
It's important to improve your diet by reducing consumption of fast food, fried food, processed snack foods, sugary drinks. Increase consumption of fresh vegetables and fruits, whole grains, water.  Ensure you are drinking 64 ounces of water daily.  Start exercising. You should be getting 150 minutes of exercise weekly.  Please update me once you learn the dates of your last tetanus, shingles, and pneumonia vaccinations.  Schedule a lab only appointment for 2 months for repeat kidney check.  It was a pleasure to see you today!

## 2019-04-26 DIAGNOSIS — Z0000 Encounter for general adult medical examination without abnormal findings: Secondary | ICD-10-CM | POA: Insufficient documentation

## 2019-04-26 NOTE — Assessment & Plan Note (Signed)
Chronic, encouraged weight loss. Overall stable with cane.

## 2019-04-26 NOTE — Assessment & Plan Note (Signed)
Recent LDL at goal. Continue statin, aspirin, clopidogrel. Encouraged weight loss.

## 2019-04-26 NOTE — Assessment & Plan Note (Signed)
Chronic, increased on today's exam. Compliant to furosemide and understands when to increase dose. Following with cardiology.

## 2019-04-26 NOTE — Assessment & Plan Note (Signed)
Immunizations UTD per patient. He will send [place] dates from the [place]. Colon cancer screening UTD. PSA UTD. Exam stable. Labs reviewed. Encouraged some form of exercise, work on diet. All recommendations provided at end of visit. Continue following with cardiology, podiatry, and endocrinology.   I have personally reviewed and have noted: 1. The patient's medical and social history 2. Their use of alcohol, tobacco or illicit drugs 3. Their current medications and supplements 4. The patient's functional ability including ADL's,  fall risks, home safety risks and hearing or  visual impairment. 5. Diet and physical activities 6. Evidence for depression or mood disorder

## 2019-04-26 NOTE — Assessment & Plan Note (Signed)
Compliant to CPAP machine.

## 2019-04-26 NOTE — Assessment & Plan Note (Signed)
Stable in the office today, continue current regimen. 

## 2019-04-26 NOTE — Assessment & Plan Note (Signed)
LDL at goal. Continue statin and clopidogrel.

## 2019-04-26 NOTE — Assessment & Plan Note (Signed)
Following with endocrinology, recent A1C of 7.8 which is overall stable. Continue current regimen. Follows with podiatry.

## 2019-04-26 NOTE — Assessment & Plan Note (Signed)
Discussed the importance of a healthy diet and regular exercise in order for weight loss, and to reduce the risk of any potential medical problems.  

## 2019-04-26 NOTE — Assessment & Plan Note (Signed)
Asymptomatic. Continue beta blocker, statin, aspirin, clopidogrel.

## 2019-04-29 DIAGNOSIS — E782 Mixed hyperlipidemia: Secondary | ICD-10-CM | POA: Diagnosis not present

## 2019-04-29 DIAGNOSIS — G4733 Obstructive sleep apnea (adult) (pediatric): Secondary | ICD-10-CM | POA: Diagnosis not present

## 2019-04-29 DIAGNOSIS — I10 Essential (primary) hypertension: Secondary | ICD-10-CM | POA: Diagnosis not present

## 2019-04-29 DIAGNOSIS — I25708 Atherosclerosis of coronary artery bypass graft(s), unspecified, with other forms of angina pectoris: Secondary | ICD-10-CM | POA: Diagnosis not present

## 2019-04-29 DIAGNOSIS — R600 Localized edema: Secondary | ICD-10-CM | POA: Diagnosis not present

## 2019-04-29 DIAGNOSIS — Z6841 Body Mass Index (BMI) 40.0 and over, adult: Secondary | ICD-10-CM | POA: Diagnosis not present

## 2019-06-13 ENCOUNTER — Other Ambulatory Visit: Payer: Self-pay

## 2019-06-13 DIAGNOSIS — Z20822 Contact with and (suspected) exposure to covid-19: Secondary | ICD-10-CM

## 2019-06-14 LAB — NOVEL CORONAVIRUS, NAA: SARS-CoV-2, NAA: NOT DETECTED

## 2019-06-20 ENCOUNTER — Other Ambulatory Visit: Payer: Self-pay | Admitting: Primary Care

## 2019-06-20 DIAGNOSIS — N289 Disorder of kidney and ureter, unspecified: Secondary | ICD-10-CM

## 2019-06-24 ENCOUNTER — Encounter: Payer: Self-pay | Admitting: Podiatry

## 2019-06-24 ENCOUNTER — Ambulatory Visit (INDEPENDENT_AMBULATORY_CARE_PROVIDER_SITE_OTHER): Payer: Medicare Other | Admitting: Podiatry

## 2019-06-24 ENCOUNTER — Other Ambulatory Visit: Payer: Self-pay

## 2019-06-24 VITALS — Temp 96.2°F

## 2019-06-24 DIAGNOSIS — E0842 Diabetes mellitus due to underlying condition with diabetic polyneuropathy: Secondary | ICD-10-CM

## 2019-06-24 DIAGNOSIS — M79676 Pain in unspecified toe(s): Secondary | ICD-10-CM

## 2019-06-24 DIAGNOSIS — B351 Tinea unguium: Secondary | ICD-10-CM

## 2019-06-24 DIAGNOSIS — L989 Disorder of the skin and subcutaneous tissue, unspecified: Secondary | ICD-10-CM

## 2019-06-26 NOTE — Progress Notes (Signed)
    Subjective: Patient is a 68 y.o. male presenting to the office today for follow up evaluation of painful callus lesion(s) noted to the right foot. Walking and bearing weight increases the pain. He has not had any recent treatment for the symptoms.  Patient also complains of elongated, thickened nails that cause pain while ambulating in shoes. He is unable to trim his own nails. Patient presents today for further treatment and evaluation.  Past Medical History:  Diagnosis Date  . CAD (coronary artery disease)    07/2007  . Diabetes ([place])   . Elevated lipids   . Glaucoma   . HBP (high blood pressure)   . Neuropathy   . Osteoarthritis   . Skin cancer   . Sleep apnea    CPAP    Objective:  Physical Exam General: Alert and oriented x3 in no acute distress  Dermatology: Hyperkeratotic lesion(s) present on the right foot. Pain on palpation with a central nucleated core noted. Skin is warm, dry and supple bilateral lower extremities. Negative for open lesions or macerations. Nails are tender, long, thickened and dystrophic with subungual debris, consistent with onychomycosis, 1-5 bilateral. No signs of infection noted.  Vascular: Palpable pedal pulses bilaterally. No edema or erythema noted. Capillary refill within normal limits.  Neurological: Epicritic and protective threshold diminished bilaterally.   Musculoskeletal Exam: Pain on palpation at the keratotic lesion(s) noted. Range of motion within normal limits bilateral. Muscle strength 5/5 in all groups bilateral.  Assessment: 1. Onychodystrophic nails 1-5 bilateral with hyperkeratosis of nails.  2. Onychomycosis of nail due to dermatophyte bilateral 3. Porokeratosis noted to the right foot 4. 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 Patient evaluated. 2. Excisional debridement of keratoic lesion(s) using a chisel blade was performed without incident.  3. Dressed with light dressing. 4. Mechanical debridement of  nails 1-5 bilaterally performed using a nail nipper. Filed with dremel without incident.  5. Patient is to return to the clinic in 3 months.   [name], DPM [facility]  Dr. [name], [address]                                        [place], [place] [address]                Office [phone]  Fax [phone]

## 2019-06-27 ENCOUNTER — Other Ambulatory Visit (INDEPENDENT_AMBULATORY_CARE_PROVIDER_SITE_OTHER): Payer: Medicare Other

## 2019-06-27 DIAGNOSIS — N289 Disorder of kidney and ureter, unspecified: Secondary | ICD-10-CM | POA: Diagnosis not present

## 2019-06-27 LAB — BASIC METABOLIC PANEL
BUN: 20 mg/dL (ref 6–23)
CO2: 32 mEq/L (ref 19–32)
Calcium: 9.3 mg/dL (ref 8.4–10.5)
Chloride: 99 mEq/L (ref 96–112)
Creatinine, Ser: 1.26 mg/dL (ref 0.40–1.50)
GFR: 56.5 mL/min — ABNORMAL LOW (ref >60.00)
Glucose, Bld: 144 mg/dL — ABNORMAL HIGH (ref 70–99)
Potassium: 4.1 mEq/L (ref 3.5–5.1)
Sodium: 138 mEq/L (ref 135–145)

## 2019-07-05 DIAGNOSIS — X32XXXA Exposure to sunlight, initial encounter: Secondary | ICD-10-CM | POA: Diagnosis not present

## 2019-07-05 DIAGNOSIS — L821 Other seborrheic keratosis: Secondary | ICD-10-CM | POA: Diagnosis not present

## 2019-07-05 DIAGNOSIS — C44519 Basal cell carcinoma of skin of other part of trunk: Secondary | ICD-10-CM | POA: Diagnosis not present

## 2019-07-05 DIAGNOSIS — D485 Neoplasm of uncertain behavior of skin: Secondary | ICD-10-CM | POA: Diagnosis not present

## 2019-07-05 DIAGNOSIS — L570 Actinic keratosis: Secondary | ICD-10-CM | POA: Diagnosis not present

## 2019-07-05 DIAGNOSIS — R58 Hemorrhage, not elsewhere classified: Secondary | ICD-10-CM | POA: Diagnosis not present

## 2019-07-05 DIAGNOSIS — L820 Inflamed seborrheic keratosis: Secondary | ICD-10-CM | POA: Diagnosis not present

## 2019-07-07 ENCOUNTER — Other Ambulatory Visit: Payer: Self-pay | Admitting: Internal Medicine

## 2019-07-07 DIAGNOSIS — I25708 Atherosclerosis of coronary artery bypass graft(s), unspecified, with other forms of angina pectoris: Secondary | ICD-10-CM

## 2019-07-07 DIAGNOSIS — R55 Syncope and collapse: Secondary | ICD-10-CM

## 2019-07-07 DIAGNOSIS — E782 Mixed hyperlipidemia: Secondary | ICD-10-CM | POA: Diagnosis not present

## 2019-07-07 DIAGNOSIS — R079 Chest pain, unspecified: Secondary | ICD-10-CM | POA: Diagnosis not present

## 2019-07-07 DIAGNOSIS — R600 Localized edema: Secondary | ICD-10-CM | POA: Diagnosis not present

## 2019-07-07 DIAGNOSIS — I6523 Occlusion and stenosis of bilateral carotid arteries: Secondary | ICD-10-CM | POA: Diagnosis not present

## 2019-07-07 DIAGNOSIS — I10 Essential (primary) hypertension: Secondary | ICD-10-CM | POA: Diagnosis not present

## 2019-07-08 ENCOUNTER — Telehealth: Payer: Self-pay

## 2019-07-08 NOTE — Telephone Encounter (Signed)
Pt received an automated call from [facility] stating he needs to do a colonoscopy or Cologard. Pt thought he did Cologard a few years ago. I did not see it in [facility].

## 2019-07-08 NOTE — Telephone Encounter (Signed)
Will into this through [employer]

## 2019-07-12 NOTE — Telephone Encounter (Signed)
Found that patient did had Cologuard done on 09/29/2016.  Patient have been notified.

## 2019-07-18 ENCOUNTER — Other Ambulatory Visit: Payer: Self-pay | Admitting: Primary Care

## 2019-07-18 DIAGNOSIS — Z1211 Encounter for screening for malignant neoplasm of colon: Secondary | ICD-10-CM

## 2019-07-18 DIAGNOSIS — R55 Syncope and collapse: Secondary | ICD-10-CM | POA: Diagnosis not present

## 2019-07-22 ENCOUNTER — Ambulatory Visit
Admission: RE | Admit: 2019-07-22 | Discharge: 2019-07-22 | Disposition: A | Discharge status: Home / Self Care | Payer: Medicare Other | Source: Ambulatory Visit | Attending: Internal Medicine | Admitting: Internal Medicine

## 2019-07-22 ENCOUNTER — Other Ambulatory Visit: Payer: Self-pay

## 2019-07-22 DIAGNOSIS — I081 Rheumatic disorders of both mitral and tricuspid valves: Secondary | ICD-10-CM | POA: Diagnosis not present

## 2019-07-22 DIAGNOSIS — I25708 Atherosclerosis of coronary artery bypass graft(s), unspecified, with other forms of angina pectoris: Secondary | ICD-10-CM

## 2019-07-22 DIAGNOSIS — I119 Hypertensive heart disease without heart failure: Secondary | ICD-10-CM | POA: Diagnosis not present

## 2019-07-22 DIAGNOSIS — R55 Syncope and collapse: Secondary | ICD-10-CM

## 2019-07-22 LAB — NM MYOCAR MULTI W/SPECT W/WALL MOTION / EF
Estimated workload: 1 METS
Exercise duration (min): 1 min
Exercise duration (sec): 0 s
LV dias vol: 93 mL (ref 62–150)
LV sys vol: 36 mL
MPHR: 150 {beats}/min
Peak HR: 75 {beats}/min
Percent HR: 50 %
Rest HR: 59 {beats}/min
SDS: 2
SRS: 19
SSS: 18
TID: 1.04

## 2019-07-22 MED ORDER — TECHNETIUM TC 99M TETROFOSMIN IV KIT
10.0000 | PACK | Freq: Once | INTRAVENOUS | Status: AC | PRN
  Administered 2019-07-22: 10.83 via INTRAVENOUS

## 2019-07-22 MED ORDER — TECHNETIUM TC 99M TETROFOSMIN IV KIT
31.9000 | PACK | Freq: Once | INTRAVENOUS | Status: AC | PRN
  Administered 2019-07-22: 31.9 via INTRAVENOUS

## 2019-07-22 MED ORDER — REGADENOSON 0.4 MG/5ML IV SOLN
0.4000 mg | Freq: Once | INTRAVENOUS | Status: AC
  Administered 2019-07-22: 0.4 mg via INTRAVENOUS
  Filled 2019-07-22: qty 5

## 2019-07-22 NOTE — Progress Notes (Signed)
*  PRELIMINARY RESULTS* Echocardiogram 2D Echocardiogram has been performed.  Sherrie Sport 07/22/2019, 11:27 AM

## 2019-07-25 DIAGNOSIS — C44519 Basal cell carcinoma of skin of other part of trunk: Secondary | ICD-10-CM | POA: Diagnosis not present

## 2019-07-26 DIAGNOSIS — I2510 Atherosclerotic heart disease of native coronary artery without angina pectoris: Secondary | ICD-10-CM | POA: Diagnosis not present

## 2019-07-26 DIAGNOSIS — R55 Syncope and collapse: Secondary | ICD-10-CM | POA: Diagnosis not present

## 2019-07-28 DIAGNOSIS — E113593 Type 2 diabetes mellitus with proliferative diabetic retinopathy without macular edema, bilateral: Secondary | ICD-10-CM | POA: Diagnosis not present

## 2019-07-28 DIAGNOSIS — E1139 Type 2 diabetes mellitus with other diabetic ophthalmic complication: Secondary | ICD-10-CM | POA: Diagnosis not present

## 2019-07-29 DIAGNOSIS — R42 Dizziness and giddiness: Secondary | ICD-10-CM | POA: Diagnosis not present

## 2019-07-29 DIAGNOSIS — I10 Essential (primary) hypertension: Secondary | ICD-10-CM | POA: Diagnosis not present

## 2019-07-29 DIAGNOSIS — E782 Mixed hyperlipidemia: Secondary | ICD-10-CM | POA: Diagnosis not present

## 2019-07-29 DIAGNOSIS — I6523 Occlusion and stenosis of bilateral carotid arteries: Secondary | ICD-10-CM | POA: Diagnosis not present

## 2019-07-29 DIAGNOSIS — I25708 Atherosclerosis of coronary artery bypass graft(s), unspecified, with other forms of angina pectoris: Secondary | ICD-10-CM | POA: Diagnosis not present

## 2019-08-11 ENCOUNTER — Other Ambulatory Visit: Payer: Self-pay | Admitting: Internal Medicine

## 2019-08-11 DIAGNOSIS — E118 Type 2 diabetes mellitus with unspecified complications: Secondary | ICD-10-CM

## 2019-08-16 ENCOUNTER — Ambulatory Visit (INDEPENDENT_AMBULATORY_CARE_PROVIDER_SITE_OTHER): Payer: Medicare Other | Admitting: Podiatry

## 2019-08-16 ENCOUNTER — Other Ambulatory Visit: Payer: Self-pay

## 2019-08-16 DIAGNOSIS — E0842 Diabetes mellitus due to underlying condition with diabetic polyneuropathy: Secondary | ICD-10-CM | POA: Diagnosis not present

## 2019-08-16 DIAGNOSIS — L97512 Non-pressure chronic ulcer of other part of right foot with fat layer exposed: Secondary | ICD-10-CM

## 2019-08-16 MED ORDER — DOXYCYCLINE HYCLATE 100 MG PO TABS: 100.0000 mg | ORAL_TABLET | Freq: Two times a day (BID) | ORAL | 0 refills | Status: DC

## 2019-08-21 NOTE — Progress Notes (Signed)
   Subjective:  68 y.o. male with PMHx of diabetes mellitus presenting today for follow up evaluation of a painful lesion noted to the sub-fifth MPJ. He reports constant burning pain from the area and believes it is a callus that needs trimming. He has not done anything for treatment at home. Walking and applying pressure to the wound increases the pain. Patient is here for further evaluation and treatment.    Past Medical History:  Diagnosis Date  . CAD (coronary artery disease)    07/2007  . Diabetes ([place])   . Elevated lipids   . Glaucoma   . HBP (high blood pressure)   . Neuropathy   . Osteoarthritis   . Skin cancer   . Sleep apnea    CPAP      Objective/Physical Exam General: The patient is alert and oriented x3 in no acute distress.  Dermatology:  Wound #1 noted to the sub-fifth MPJ of the right foot cm (LxWxD).   To the noted ulceration(s), there is no eschar. There is a moderate amount of slough, fibrin, and necrotic tissue noted. Granulation tissue and wound base is red. There is a minimal amount of serosanguineous drainage noted. There is no exposed bone muscle-tendon ligament or joint. There is no malodor. Periwound integrity is intact. Skin is warm, dry and supple bilateral lower extremities.  Vascular: Palpable pedal pulses bilaterally. No edema or erythema noted. Capillary refill within normal limits.  Neurological: Epicritic and protective threshold diminished bilaterally.   Musculoskeletal Exam: Range of motion within normal limits to all pedal and ankle joints bilateral. Muscle strength 5/5 in all groups bilateral.   Assessment: 1. 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 diabetes mellitus w/ peripheral neuropathy   Plan of Care:  1. Patient was evaluated. 2. medically necessary excisional debridement including subcutaneous tissue was performed using a tissue nipper and a chisel blade. Excisional debridement of all the  necrotic nonviable tissue down to healthy bleeding viable tissue was performed with post-debridement measurements same as pre-. 3. the wound was cleansed and dry sterile dressing applied. 4. Prescription for Doxycycline provided to patient.  5. Recommended Betadine ointment daily.  6. Post op shoe dispensed.  7. patient is to return to clinic in 3 weeks.   [name], DPM [facility]  Dr. [name], [address]                                        [place], [place] [address]                Office [phone]  Fax [phone]

## 2019-08-31 DIAGNOSIS — E1139 Type 2 diabetes mellitus with other diabetic ophthalmic complication: Secondary | ICD-10-CM | POA: Diagnosis not present

## 2019-09-01 ENCOUNTER — Other Ambulatory Visit: Payer: Self-pay | Admitting: Podiatry

## 2019-09-01 MED ORDER — DOXYCYCLINE HYCLATE 100 MG PO TABS: 100.0000 mg | ORAL_TABLET | Freq: Two times a day (BID) | ORAL | 0 refills | Status: DC

## 2019-09-06 ENCOUNTER — Other Ambulatory Visit: Payer: Self-pay

## 2019-09-06 ENCOUNTER — Ambulatory Visit (INDEPENDENT_AMBULATORY_CARE_PROVIDER_SITE_OTHER): Payer: Medicare Other | Admitting: Podiatry

## 2019-09-06 ENCOUNTER — Encounter: Payer: Self-pay | Admitting: Podiatry

## 2019-09-06 ENCOUNTER — Ambulatory Visit (INDEPENDENT_AMBULATORY_CARE_PROVIDER_SITE_OTHER): Payer: Medicare Other

## 2019-09-06 DIAGNOSIS — L97512 Non-pressure chronic ulcer of other part of right foot with fat layer exposed: Secondary | ICD-10-CM

## 2019-09-06 DIAGNOSIS — E0842 Diabetes mellitus due to underlying condition with diabetic polyneuropathy: Secondary | ICD-10-CM | POA: Diagnosis not present

## 2019-09-09 MED ORDER — DOXYCYCLINE HYCLATE 100 MG PO TABS: 100.0000 mg | ORAL_TABLET | Freq: Two times a day (BID) | ORAL | 0 refills | Status: DC

## 2019-09-09 MED ORDER — GENTAMICIN SULFATE 0.1 % EX CREA: 1.0000 "application " | TOPICAL_CREAM | Freq: Three times a day (TID) | CUTANEOUS | 1 refills | Status: DC

## 2019-09-09 NOTE — Progress Notes (Signed)
   Subjective:  68 y.o. male with PMHx of diabetes mellitus presenting today for follow up evaluation of an ulceration of the right sub-fifth MPJ. He states the wound is about the same. He reports some associated stinging of the plantar and lateral aspects of the foot. He has been taking Doxycycline and using the post op shoes as directed. Patient is here for further evaluation and treatment.   Past Medical History:  Diagnosis Date  . CAD (coronary artery disease)    07/2007  . Diabetes ([place])   . Elevated lipids   . Glaucoma   . HBP (high blood pressure)   . Neuropathy   . Osteoarthritis   . Skin cancer   . Sleep apnea    CPAP      Objective/Physical Exam General: The patient is alert and oriented x3 in no acute distress.  Dermatology:  Wound #1 noted to the sub-fifth MPJ of the right foot measuring 1.0 x 1.0 x 0.2 cm (LxWxD).   To the noted ulceration(s), there is no eschar. There is a moderate amount of slough, fibrin, and necrotic tissue noted. Granulation tissue and wound base is red. There is a minimal amount of serosanguineous drainage noted. There is no exposed bone muscle-tendon ligament or joint. There is no malodor. Periwound integrity is intact. Skin is warm, dry and supple bilateral lower extremities.  Vascular: Palpable pedal pulses bilaterally. No edema or erythema noted. Capillary refill within normal limits.  Neurological: Epicritic and protective threshold diminished bilaterally.   Musculoskeletal Exam: Range of motion within normal limits to all pedal and ankle joints bilateral. Muscle strength 5/5 in all groups bilateral.   Assessment: 1. 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 diabetes mellitus w/ peripheral neuropathy   Plan of Care:  1. Patient was evaluated. 2. medically necessary excisional debridement including subcutaneous tissue was performed using a tissue nipper and a chisel blade. Excisional debridement of all  the necrotic nonviable tissue down to healthy bleeding viable tissue was performed with post-debridement measurements same as pre-. 3. the wound was cleansed and dry sterile dressing applied. 4. Refill prescription for Doxycycline 100 mg #20 provided to patient.  5. Continue using post op shoe.  6. Return to clinic in 2 weeks.    [name], DPM [facility]  Dr. [name], [address] [place]                                        [place], [place] [address]                Office [phone]  Fax [phone]

## 2019-09-20 ENCOUNTER — Ambulatory Visit (INDEPENDENT_AMBULATORY_CARE_PROVIDER_SITE_OTHER): Payer: Medicare Other | Admitting: Podiatry

## 2019-09-20 ENCOUNTER — Encounter: Payer: Self-pay | Admitting: Podiatry

## 2019-09-20 ENCOUNTER — Other Ambulatory Visit: Payer: Self-pay

## 2019-09-20 DIAGNOSIS — E0842 Diabetes mellitus due to underlying condition with diabetic polyneuropathy: Secondary | ICD-10-CM

## 2019-09-20 DIAGNOSIS — L97512 Non-pressure chronic ulcer of other part of right foot with fat layer exposed: Secondary | ICD-10-CM

## 2019-09-20 MED ORDER — PREGABALIN 150 MG PO CAPS: 150.0000 mg | ORAL_CAPSULE | Freq: Two times a day (BID) | ORAL | 0 refills | Status: DC

## 2019-09-23 NOTE — Progress Notes (Signed)
   Subjective:  68 y.o. male with PMHx of diabetes mellitus presenting today for follow up evaluation of an ulceration of the right sub-fifth MPJ. He states he is doing well. He reports some intermittent burning pain secondary to neuropathy. He has been using the post op shoe and Gentamicin cream daily as directed. He denies any worsening factors at this time. Patient is here for further evaluation and treatment.   Past Medical History:  Diagnosis Date  . CAD (coronary artery disease)    07/2007  . Diabetes ([place])   . Elevated lipids   . Glaucoma   . HBP (high blood pressure)   . Neuropathy   . Osteoarthritis   . Skin cancer   . Sleep apnea    CPAP      Objective/Physical Exam General: The patient is alert and oriented x3 in no acute distress.  Dermatology:  Wound #1 noted to the sub-fifth MPJ of the right foot measuring 0.7 x 0.7 x 0.1 cm (LxWxD).   To the noted ulceration(s), there is no eschar. There is a moderate amount of slough, fibrin, and necrotic tissue noted. Granulation tissue and wound base is red. There is a minimal amount of serosanguineous drainage noted. There is no exposed bone muscle-tendon ligament or joint. There is no malodor. Periwound integrity is intact. Skin is warm, dry and supple bilateral lower extremities.  Vascular: Palpable pedal pulses bilaterally. No edema or erythema noted. Capillary refill within normal limits.  Neurological: Epicritic and protective threshold diminished bilaterally.   Musculoskeletal Exam: Range of motion within normal limits to all pedal and ankle joints bilateral. Muscle strength 5/5 in all groups bilateral.   Assessment: 1. 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 diabetes mellitus w/ peripheral neuropathy   Plan of Care:  1. Patient was evaluated. 2. medically necessary excisional debridement including subcutaneous tissue was performed using a tissue nipper and a chisel blade. Excisional  debridement of all the necrotic nonviable tissue down to healthy bleeding viable tissue was performed with post-debridement measurements same as pre-. 3. the wound was cleansed and dry sterile dressing applied. 4. Continue using post op shoe.  5. Continue using Gentamicin cream daily.  6. Offloading dancer's pad applied to post op shoe.  7. Prescription for Lyrica 150 mg BID provided to patient.  8. Return to clinic in 3 weeks.    [name], DPM [facility]  Dr. [name], [address]                                        [place], [place] [address]                Office [phone]  Fax [phone]

## 2019-10-11 ENCOUNTER — Encounter: Payer: Self-pay | Admitting: Podiatry

## 2019-10-11 ENCOUNTER — Other Ambulatory Visit: Payer: Self-pay

## 2019-10-11 ENCOUNTER — Ambulatory Visit (INDEPENDENT_AMBULATORY_CARE_PROVIDER_SITE_OTHER): Payer: Medicare Other | Admitting: Podiatry

## 2019-10-11 DIAGNOSIS — L97512 Non-pressure chronic ulcer of other part of right foot with fat layer exposed: Secondary | ICD-10-CM | POA: Diagnosis not present

## 2019-10-11 DIAGNOSIS — E0842 Diabetes mellitus due to underlying condition with diabetic polyneuropathy: Secondary | ICD-10-CM

## 2019-10-11 MED ORDER — L-METHYLFOLATE-B6-B12 3-35-2 MG PO TABS: 1.0000 | ORAL_TABLET | Freq: Every day | ORAL | 3 refills | Status: DC

## 2019-10-14 NOTE — Progress Notes (Signed)
   Subjective:  68 y.o. male with PMHx of diabetes mellitus presenting today for follow up evaluation of an ulceration of the right sub-fifth MPJ. He states he is doing well. He states he believes the wound is "closing up" and healing. He has been using Gentamicin cream and the post op shoe as directed. He reports the Lyrica made him nauseous and caused some vomiting. Patient is here for further evaluation and treatment.   Past Medical History:  Diagnosis Date  . CAD (coronary artery disease)    07/2007  . Diabetes ([place])   . Elevated lipids   . Glaucoma   . HBP (high blood pressure)   . Neuropathy   . Osteoarthritis   . Skin cancer   . Sleep apnea    CPAP      Objective/Physical Exam General: The patient is alert and oriented x3 in no acute distress.  Dermatology:  Wound #1 noted to the sub-fifth MPJ of the right foot measuring 0.5 x 0.5 x 0.1 cm (LxWxD).   To the noted ulceration(s), there is no eschar. There is a moderate amount of slough, fibrin, and necrotic tissue noted. Granulation tissue and wound base is red. There is a minimal amount of serosanguineous drainage noted. There is no exposed bone muscle-tendon ligament or joint. There is no malodor. Periwound integrity is intact. Skin is warm, dry and supple bilateral lower extremities.  Vascular: Palpable pedal pulses bilaterally. No edema or erythema noted. Capillary refill within normal limits.  Neurological: Epicritic and protective threshold diminished bilaterally.   Musculoskeletal Exam: Range of motion within normal limits to all pedal and ankle joints bilateral. Muscle strength 5/5 in all groups bilateral.   Assessment: 1. 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 diabetes mellitus w/ peripheral neuropathy   Plan of Care:  1. Patient was evaluated. 2. medically necessary excisional debridement including subcutaneous tissue was performed using a tissue nipper and a chisel blade.  Excisional debridement of all the necrotic nonviable tissue down to healthy bleeding viable tissue was performed with post-debridement measurements same as pre-. 3. the wound was cleansed and dry sterile dressing applied. 4. Continue using post op shoe.  5. Continue using Gentamicin cream daily.  6. Prescription for Metanx supplement for Neuropathy provided to patient.  7. Discontinue taking Lyrical due to nausea and vomiting.  8. Return to clinic in 3 weeks.    [name], DPM [facility]  Dr. [name], [address]                                        [place], [place] [address]                Office [phone]  Fax [phone]

## 2019-10-25 ENCOUNTER — Other Ambulatory Visit: Payer: Self-pay

## 2019-10-25 ENCOUNTER — Encounter: Payer: Self-pay | Admitting: Podiatry

## 2019-10-25 ENCOUNTER — Ambulatory Visit (INDEPENDENT_AMBULATORY_CARE_PROVIDER_SITE_OTHER): Payer: Medicare Other | Admitting: Podiatry

## 2019-10-25 ENCOUNTER — Ambulatory Visit: Payer: Medicare Other | Admitting: Podiatry

## 2019-10-25 DIAGNOSIS — L97512 Non-pressure chronic ulcer of other part of right foot with fat layer exposed: Secondary | ICD-10-CM

## 2019-10-25 DIAGNOSIS — E0842 Diabetes mellitus due to underlying condition with diabetic polyneuropathy: Secondary | ICD-10-CM | POA: Diagnosis not present

## 2019-10-31 LAB — COLOGUARD: Cologuard: NEGATIVE

## 2019-10-31 NOTE — Progress Notes (Signed)
   Subjective:  68 y.o. male with PMHx of diabetes mellitus presenting today for follow up evaluation of an ulceration of the right sub-fifth MPJ. He states the wound looks better and is improving. He has been applying the Gentamicin cream and has been using the post op shoe as directed. There are no aggravating factors noted. Patient is here for further evaluation and treatment.   Past Medical History:  Diagnosis Date  . CAD (coronary artery disease)    07/2007  . Diabetes ([place])   . Elevated lipids   . Glaucoma   . HBP (high blood pressure)   . Neuropathy   . Osteoarthritis   . Skin cancer   . Sleep apnea    CPAP      Objective/Physical Exam General: The patient is alert and oriented x3 in no acute distress.  Dermatology:  Wound #1 noted to the sub-fifth MPJ of the right foot measuring 0.6 x 0.6 x 0.1 cm (LxWxD).   To the noted ulceration(s), there is no eschar. There is a moderate amount of slough, fibrin, and necrotic tissue noted. Granulation tissue and wound base is red. There is a minimal amount of serosanguineous drainage noted. There is no exposed bone muscle-tendon ligament or joint. There is no malodor. Periwound integrity is intact. Skin is warm, dry and supple bilateral lower extremities.  Vascular: Palpable pedal pulses bilaterally. No edema or erythema noted. Capillary refill within normal limits.  Neurological: Epicritic and protective threshold diminished bilaterally.   Musculoskeletal Exam: Range of motion within normal limits to all pedal and ankle joints bilateral. Muscle strength 5/5 in all groups bilateral.   Assessment: 1. 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 diabetes mellitus w/ peripheral neuropathy   Plan of Care:  1. Patient was evaluated. 2. medically necessary excisional debridement including subcutaneous tissue was performed using a tissue nipper and a chisel blade. Excisional debridement of all the necrotic  nonviable tissue down to healthy bleeding viable tissue was performed with post-debridement measurements same as pre-. 3. the wound was cleansed and dry sterile dressing applied. 4. Continue using post op shoe.  5. Continue using Gentamicin cream daily.  6. Return to clinic in 3 weeks for nail trim and follow up evaluation of the ulceration. If no improvement, we may try Santyl.     [name], DPM [facility]  Dr. [name], [address]                                        [place], [place] [address]                Office [phone]  Fax [phone]

## 2019-11-01 ENCOUNTER — Ambulatory Visit: Payer: Medicare Other | Admitting: Podiatry

## 2019-11-02 ENCOUNTER — Encounter: Payer: Self-pay | Admitting: Primary Care

## 2019-11-03 ENCOUNTER — Encounter: Payer: Self-pay | Admitting: Primary Care

## 2019-11-15 ENCOUNTER — Ambulatory Visit (INDEPENDENT_AMBULATORY_CARE_PROVIDER_SITE_OTHER): Payer: Medicare Other | Admitting: Podiatry

## 2019-11-15 ENCOUNTER — Other Ambulatory Visit: Payer: Self-pay

## 2019-11-15 ENCOUNTER — Encounter: Payer: Self-pay | Admitting: Podiatry

## 2019-11-15 DIAGNOSIS — M79676 Pain in unspecified toe(s): Secondary | ICD-10-CM

## 2019-11-15 DIAGNOSIS — B351 Tinea unguium: Secondary | ICD-10-CM

## 2019-11-15 DIAGNOSIS — L97512 Non-pressure chronic ulcer of other part of right foot with fat layer exposed: Secondary | ICD-10-CM

## 2019-11-15 DIAGNOSIS — E0842 Diabetes mellitus due to underlying condition with diabetic polyneuropathy: Secondary | ICD-10-CM

## 2019-11-15 MED ORDER — SANTYL 250 UNIT/GM EX OINT: 1.0000 "application " | TOPICAL_OINTMENT | Freq: Every day | CUTANEOUS | 1 refills | Status: DC

## 2019-11-15 MED ORDER — DOXYCYCLINE HYCLATE 100 MG PO TABS: 100.0000 mg | ORAL_TABLET | Freq: Two times a day (BID) | ORAL | 0 refills | Status: DC

## 2019-11-18 NOTE — Progress Notes (Signed)
   Subjective:  69 y.o. male with PMHx of diabetes mellitus presenting today for follow up evaluation of an ulceration of the right sub-fifth MPJ. He states the wound looks about the same. He reports some associated stabbing pain around the ulceration. Touching the area increases the pain. He has been applying Gentamicin cream as directed.  He also reports elongated, thickened nails 1-5 bilaterally that cause pain while ambulating in shoes. He is unable to trim his own nails. Patient is here for further evaluation and treatment.   Past Medical History:  Diagnosis Date  . CAD (coronary artery disease)    07/2007  . Diabetes ([place])   . Elevated lipids   . Glaucoma   . HBP (high blood pressure)   . Neuropathy   . Osteoarthritis   . Skin cancer   . Sleep apnea    CPAP      Objective/Physical Exam General: The patient is alert and oriented x3 in no acute distress.  Dermatology:  Wound #1 noted to the sub-fifth MPJ of the right foot measuring 1.0 x 1.0 x 0.1 cm (LxWxD).   To the noted ulceration(s), there is no eschar. There is a moderate amount of slough, fibrin, and necrotic tissue noted. Granulation tissue and wound base is red. There is a minimal amount of serosanguineous drainage noted. There is no exposed bone muscle-tendon ligament or joint. There is no malodor. Periwound integrity is intact. Nails are tender, long, thickened and dystrophic with subungual debris, consistent with onychomycosis, 1-5 bilateral. No signs of infection noted. Skin is warm, dry and supple bilateral lower extremities.  Vascular: Palpable pedal pulses bilaterally. No edema or erythema noted. Capillary refill within normal limits.  Neurological: Epicritic and protective threshold diminished bilaterally.   Musculoskeletal Exam: Range of motion within normal limits to all pedal and ankle joints bilateral. Muscle strength 5/5 in all groups bilateral.   Assessment: 1. Ulceration of the right sub-fifth MPJ  secondary to diabetes mellitus 2. diabetes mellitus w/ peripheral neuropathy 3. Onychomycosis of nail due to dermatophyte bilateral   Plan of Care:  1. Patient was evaluated. 2. medically necessary excisional debridement including subcutaneous tissue was performed using a tissue nipper and a chisel blade. Excisional debridement of all the necrotic nonviable tissue down to healthy bleeding viable tissue was performed with post-debridement measurements same as pre-. 3. the wound was cleansed and dry sterile dressing applied. 4. Prescription for Santyl ointment provided to patient.  5. Prescription for Doxycycline 100 mg #20 provided to patient.  6. Mechanical debridement of nails 1-5 bilaterally performed using a nail nipper. Filed with dremel without incident.  7. Return to clinic in 3 weeks. If not better, we will discuss surgery.    [name], DPM [facility]  Dr. [name], [address]                                        [place], [place] [address]                Office [phone]  Fax [phone]

## 2019-11-24 DIAGNOSIS — D2262 Melanocytic nevi of left upper limb, including shoulder: Secondary | ICD-10-CM | POA: Diagnosis not present

## 2019-11-24 DIAGNOSIS — L821 Other seborrheic keratosis: Secondary | ICD-10-CM | POA: Diagnosis not present

## 2019-11-24 DIAGNOSIS — D485 Neoplasm of uncertain behavior of skin: Secondary | ICD-10-CM | POA: Diagnosis not present

## 2019-11-24 DIAGNOSIS — L570 Actinic keratosis: Secondary | ICD-10-CM | POA: Diagnosis not present

## 2019-11-24 DIAGNOSIS — D2271 Melanocytic nevi of right lower limb, including hip: Secondary | ICD-10-CM | POA: Diagnosis not present

## 2019-11-24 DIAGNOSIS — D2261 Melanocytic nevi of right upper limb, including shoulder: Secondary | ICD-10-CM | POA: Diagnosis not present

## 2019-11-24 DIAGNOSIS — R600 Localized edema: Secondary | ICD-10-CM | POA: Diagnosis not present

## 2019-11-24 DIAGNOSIS — Z85828 Personal history of other malignant neoplasm of skin: Secondary | ICD-10-CM | POA: Diagnosis not present

## 2019-11-25 ENCOUNTER — Ambulatory Visit (INDEPENDENT_AMBULATORY_CARE_PROVIDER_SITE_OTHER): Payer: Medicare Other | Admitting: Podiatry

## 2019-11-25 ENCOUNTER — Other Ambulatory Visit: Payer: Self-pay

## 2019-11-25 DIAGNOSIS — L97512 Non-pressure chronic ulcer of other part of right foot with fat layer exposed: Secondary | ICD-10-CM

## 2019-11-25 DIAGNOSIS — L03115 Cellulitis of right lower limb: Secondary | ICD-10-CM

## 2019-11-25 DIAGNOSIS — E0842 Diabetes mellitus due to underlying condition with diabetic polyneuropathy: Secondary | ICD-10-CM

## 2019-11-25 MED ORDER — SULFAMETHOXAZOLE-TRIMETHOPRIM 800-160 MG PO TABS: 1.0000 | ORAL_TABLET | Freq: Two times a day (BID) | ORAL | 0 refills | Status: DC

## 2019-11-27 NOTE — Progress Notes (Signed)
   Subjective:  69 y.o. male with PMHx of diabetes mellitus presenting today for follow up evaluation of an ulceration of the right sub-fifth MPJ. He states the wound does not look improved. He reports redness of the right lower extremity. He reports finishing the Doxycycline this morning. He has been using the Santyl ointment as directed. He denies any modifying factors. Patient is here for further evaluation and treatment.   Past Medical History:  Diagnosis Date  . CAD (coronary artery disease)    07/2007  . Diabetes ([place])   . Elevated lipids   . Glaucoma   . HBP (high blood pressure)   . Neuropathy   . Osteoarthritis   . Skin cancer   . Sleep apnea    CPAP      Objective/Physical Exam General: The patient is alert and oriented x3 in no acute distress.  Dermatology:  Wound #1 noted to the sub-fifth MPJ of the right foot measuring 1.5 x 1.5 x 0.2 cm (LxWxD).   To the noted ulceration(s), there is no eschar. There is a moderate amount of slough, fibrin, and necrotic tissue noted. Granulation tissue and wound base is red. There is a minimal amount of serosanguineous drainage noted. There is no exposed bone muscle-tendon ligament or joint. There is no malodor. Periwound integrity is intact. Erythema and edema noted to the right lower extremity.  Hyperkeratotic, discolored, thickened, onychodystrophy of nails 1-5 bilateral noted. No signs of infection noted. Skin is warm, dry and supple bilateral lower extremities.  Vascular: Palpable pedal pulses bilaterally. No edema or erythema noted. Capillary refill within normal limits.  Neurological: Epicritic and protective threshold diminished bilaterally.   Musculoskeletal Exam: Range of motion within normal limits to all pedal and ankle joints bilateral. Muscle strength 5/5 in all groups bilateral.   Assessment: 1. 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 diabetes mellitus w/ peripheral neuropathy 3.  Onychomycosis of nail due to dermatophyte bilateral 4. Cellulitis right lower extremity    Plan of Care:  1. Patient was evaluated. 2. medically necessary excisional debridement including subcutaneous tissue was performed using a tissue nipper and a chisel blade. Excisional debridement of all the necrotic nonviable tissue down to healthy bleeding viable tissue was performed with post-debridement measurements same as pre-. 3. the wound was cleansed and dry sterile dressing applied. 4. Culture taken from wound.  5. Patient finished oral Doxycycline 100 mg this morning.  6. Prescription for Bactrim DS BID #20 provided to patient.  7. MRI ordered for the right forefoot.  8.Contiue using post op shoe.  9. Return to clinic in 10 days.     [name], DPM [facility]  Dr. [name], [address]                                        [place], [place] [address]                Office [phone]  Fax [phone]

## 2019-11-28 ENCOUNTER — Telehealth: Payer: Self-pay

## 2019-11-28 DIAGNOSIS — L97512 Non-pressure chronic ulcer of other part of right foot with fat layer exposed: Secondary | ICD-10-CM

## 2019-11-28 DIAGNOSIS — M86171 Other acute osteomyelitis, right ankle and foot: Secondary | ICD-10-CM

## 2019-11-28 LAB — WOUND CULTURE

## 2019-11-28 NOTE — Telephone Encounter (Signed)
No precert required for [employer] secondary to Medicare Patient has been notified and will call scheduling department to set up appt to his convenience

## 2019-11-28 NOTE — Telephone Encounter (Signed)
-----   Message from [name], DPM sent at 11/25/2019  7:51 AM EST ----- Regarding: MRI right forefoot Please order MRI RT forefoot w/out contrast.   Dx: ulcer sub 5th MTPJ concerning for OM  Thanks, Dr. Amalia Hailey

## 2019-12-06 ENCOUNTER — Encounter: Payer: Self-pay | Admitting: Podiatry

## 2019-12-06 ENCOUNTER — Ambulatory Visit (INDEPENDENT_AMBULATORY_CARE_PROVIDER_SITE_OTHER): Payer: Medicare Other | Admitting: Podiatry

## 2019-12-06 ENCOUNTER — Other Ambulatory Visit: Payer: Self-pay

## 2019-12-06 DIAGNOSIS — L97512 Non-pressure chronic ulcer of other part of right foot with fat layer exposed: Secondary | ICD-10-CM | POA: Diagnosis not present

## 2019-12-06 DIAGNOSIS — E0842 Diabetes mellitus due to underlying condition with diabetic polyneuropathy: Secondary | ICD-10-CM

## 2019-12-07 ENCOUNTER — Ambulatory Visit
Admission: RE | Admit: 2019-12-07 | Discharge: 2019-12-07 | Disposition: A | Discharge status: Home / Self Care | Payer: Medicare Other | Source: Ambulatory Visit | Attending: Podiatry | Admitting: Podiatry

## 2019-12-07 ENCOUNTER — Other Ambulatory Visit: Payer: Self-pay

## 2019-12-07 DIAGNOSIS — M86171 Other acute osteomyelitis, right ankle and foot: Secondary | ICD-10-CM | POA: Insufficient documentation

## 2019-12-07 DIAGNOSIS — L97512 Non-pressure chronic ulcer of other part of right foot with fat layer exposed: Secondary | ICD-10-CM | POA: Insufficient documentation

## 2019-12-07 DIAGNOSIS — R600 Localized edema: Secondary | ICD-10-CM | POA: Diagnosis not present

## 2019-12-08 DIAGNOSIS — L570 Actinic keratosis: Secondary | ICD-10-CM | POA: Diagnosis not present

## 2019-12-09 NOTE — Progress Notes (Signed)
   Subjective:  69 y.o. male with PMHx of diabetes mellitus presenting today for follow up evaluation of an ulceration of the right sub-fifth MPJ with associated cellulitis of the right lower extremity. He states he is doing well overall and has improved. He reports a decrease in pain. He reports finishing the antibiotics yesterday. He continues using Gentamicin cream and the post op shoe as directed. Patient is here for further evaluation and treatment.   Past Medical History:  Diagnosis Date  . CAD (coronary artery disease)    07/2007  . Diabetes ([place])   . Elevated lipids   . Glaucoma   . HBP (high blood pressure)   . Neuropathy   . Osteoarthritis   . Skin cancer   . Sleep apnea    CPAP      Objective/Physical Exam General: The patient is alert and oriented x3 in no acute distress.  Dermatology:  Wound #1 noted to the sub-fifth MPJ of the right foot measuring 1.0 x 1.0 x 0.2 cm (LxWxD).   To the noted ulceration(s), there is no eschar. There is a moderate amount of slough, fibrin, and necrotic tissue noted. Granulation tissue and wound base is red. There is a minimal amount of serosanguineous drainage noted. There is no exposed bone muscle-tendon ligament or joint. There is no malodor. Periwound integrity is intact.   Skin is warm, dry and supple bilateral lower extremities.  Vascular: Palpable pedal pulses bilaterally. No edema or erythema noted. Capillary refill within normal limits.  Neurological: Epicritic and protective threshold diminished bilaterally.   Musculoskeletal Exam: Range of motion within normal limits to all pedal and ankle joints bilateral. Muscle strength 5/5 in all groups bilateral.   Assessment: 1. 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 diabetes mellitus w/ peripheral neuropathy 3. Cellulitis right lower extremity - resolved    Plan of Care:  1. Patient was evaluated. 2. medically necessary excisional debridement  including subcutaneous tissue was performed using a tissue nipper and a chisel blade. Excisional debridement of all the necrotic nonviable tissue down to healthy bleeding viable tissue was performed with post-debridement measurements same as pre-. 3. the wound was cleansed and dry sterile dressing applied. 4. MRI scheduled for tomorrow, 12/07/2019.  5. Continue using Gentamicin cream.  6. Continue using post op shoe.  7. Return to clinic in 10 days to review MRI results.     [name], DPM [facility]  Dr. [name], [address]                                        [place], [place] [address]                Office [phone]  Fax [phone]

## 2019-12-16 ENCOUNTER — Other Ambulatory Visit: Payer: Self-pay

## 2019-12-16 ENCOUNTER — Encounter: Payer: Self-pay | Admitting: Podiatry

## 2019-12-16 ENCOUNTER — Ambulatory Visit (INDEPENDENT_AMBULATORY_CARE_PROVIDER_SITE_OTHER): Payer: Medicare Other | Admitting: Podiatry

## 2019-12-16 DIAGNOSIS — L97512 Non-pressure chronic ulcer of other part of right foot with fat layer exposed: Secondary | ICD-10-CM

## 2019-12-16 DIAGNOSIS — E0842 Diabetes mellitus due to underlying condition with diabetic polyneuropathy: Secondary | ICD-10-CM

## 2019-12-20 NOTE — Progress Notes (Signed)
   Subjective:  69 y.o. male with PMHx of diabetes mellitus presenting today for follow up evaluation of an ulceration of the right sub-fifth MPJ. He states the wound looks better. He reports some associated drainage and bleeding. He has been using the CAM boot and Santyl ointment as directed. He had an MRI done on 12/07/2019 and is here to review the results. Patient is here for further evaluation and treatment.   Past Medical History:  Diagnosis Date  . CAD (coronary artery disease)    07/2007  . Diabetes ([place])   . Elevated lipids   . Glaucoma   . HBP (high blood pressure)   . Neuropathy   . Osteoarthritis   . Skin cancer   . Sleep apnea    CPAP      Objective/Physical Exam General: The patient is alert and oriented x3 in no acute distress.  Dermatology:  Wound #1 noted to the sub-fifth MPJ of the right foot measuring 1.0 x 1.0 x 0.2 cm (LxWxD).   To the noted ulceration(s), there is no eschar. There is a moderate amount of slough, fibrin, and necrotic tissue noted. Granulation tissue and wound base is red. There is a minimal amount of serosanguineous drainage noted. There is no exposed bone muscle-tendon ligament or joint. There is no malodor. Periwound integrity is intact.   Skin is warm, dry and supple bilateral lower extremities.  Vascular: Palpable pedal pulses bilaterally. No edema or erythema noted. Capillary refill within normal limits.  Neurological: Epicritic and protective threshold diminished bilaterally.   Musculoskeletal Exam: Range of motion within normal limits to all pedal and ankle joints bilateral. Muscle strength 5/5 in all groups bilateral.   MRI Impression (done on 12/07/2019): 1. No definite osteomyelitis is identified. There is some linear wispy accentuated T2 signal in the head of the fifth metatarsal, which is probably reactive given the degree of subtlety, and does not qualify as active osteomyelitis despite the proximity to the ulceration. 2. Extensive  dorsal subcutaneous edema in the forefoot, tracking into the toes.  Assessment: 1. 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 diabetes mellitus w/ peripheral neuropathy 3. Cellulitis right lower extremity - resolved    Plan of Care:  1. Patient was evaluated. MRI reviewed.  2. medically necessary excisional debridement including subcutaneous tissue was performed using a tissue nipper and a chisel blade. Excisional debridement of all the necrotic nonviable tissue down to healthy bleeding viable tissue was performed with post-debridement measurements same as pre-. 3. the wound was cleansed and dry sterile dressing applied. 4. CAM boot dispensed.  5. Continue using Santyl ointment daily.  6. Return to clinic in 3 weeks.     [name], DPM [facility]  Dr. [name], [address]                                        [place], [place] [address]                Office [phone]  Fax [phone]

## 2019-12-21 ENCOUNTER — Telehealth: Payer: Self-pay | Admitting: *Deleted

## 2019-12-21 NOTE — Telephone Encounter (Signed)
Message left on nurse VM-  Felicia with [facility] called regarding Santyl Rx- to fill the prescription they need the following info-  1) # of wounds  2) size of wound(s)  3) days supply of medication   Please call with info!

## 2020-01-06 ENCOUNTER — Ambulatory Visit (INDEPENDENT_AMBULATORY_CARE_PROVIDER_SITE_OTHER): Payer: Medicare Other | Admitting: Podiatry

## 2020-01-06 ENCOUNTER — Encounter: Payer: Self-pay | Admitting: Podiatry

## 2020-01-06 ENCOUNTER — Other Ambulatory Visit: Payer: Self-pay

## 2020-01-06 VITALS — Temp 97.4°F

## 2020-01-06 DIAGNOSIS — E113593 Type 2 diabetes mellitus with proliferative diabetic retinopathy without macular edema, bilateral: Secondary | ICD-10-CM | POA: Diagnosis not present

## 2020-01-06 DIAGNOSIS — E0842 Diabetes mellitus due to underlying condition with diabetic polyneuropathy: Secondary | ICD-10-CM

## 2020-01-06 DIAGNOSIS — L97512 Non-pressure chronic ulcer of other part of right foot with fat layer exposed: Secondary | ICD-10-CM

## 2020-01-10 NOTE — Progress Notes (Signed)
   Subjective:  69 y.o. male with PMHx of diabetes mellitus presenting today for follow up evaluation of an ulceration of the right sub-fifth MPJ. He states he is doing well and the wound seems to be improving. He has been using the CAM boot and Santyl ointment as directed. He denies any worsening factors at this time. Patient is here for further evaluation and treatment.   Past Medical History:  Diagnosis Date  . CAD (coronary artery disease)    07/2007  . Diabetes ([place])   . Elevated lipids   . Glaucoma   . HBP (high blood pressure)   . Neuropathy   . Osteoarthritis   . Skin cancer   . Sleep apnea    CPAP      Objective/Physical Exam General: The patient is alert and oriented x3 in no acute distress.  Dermatology:  Wound #1 noted to the sub-fifth MPJ of the right foot measuring 1.0 x 1.0 x 0.2 cm (LxWxD).   To the noted ulceration(s), there is no eschar. There is a moderate amount of slough, fibrin, and necrotic tissue noted. Granulation tissue and wound base is red. There is a minimal amount of serosanguineous drainage noted. There is no exposed bone muscle-tendon ligament or joint. There is no malodor. Periwound integrity is intact.   Skin is warm, dry and supple bilateral lower extremities.  Vascular: Palpable pedal pulses bilaterally. No edema or erythema noted. Capillary refill within normal limits.  Neurological: Epicritic and protective threshold diminished bilaterally.   Musculoskeletal Exam: Range of motion within normal limits to all pedal and ankle joints bilateral. Muscle strength 5/5 in all groups bilateral.    Assessment: 1. 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 diabetes mellitus w/ peripheral neuropathy 3. Cellulitis right lower extremity - resolved    Plan of Care:  1. Patient was evaluated. 2. medically necessary excisional debridement including subcutaneous tissue was performed using a tissue nipper and a chisel blade.  Excisional debridement of all the necrotic nonviable tissue down to healthy bleeding viable tissue was performed with post-debridement measurements same as pre-. 3. the wound was cleansed and dry sterile dressing applied. 4. Continue using CAM boot.   5. Continue using Santyl ointment daily.  6. Finish oral Amoxicillin as prescribed by PCP for sinus infection.  7. Return to clinic in 3 weeks.     [name], DPM [facility]  Dr. [name], [address]                                        [place], [place] [address]                Office [phone]  Fax [phone]

## 2020-01-12 DIAGNOSIS — R600 Localized edema: Secondary | ICD-10-CM | POA: Diagnosis not present

## 2020-01-12 DIAGNOSIS — E782 Mixed hyperlipidemia: Secondary | ICD-10-CM | POA: Diagnosis not present

## 2020-01-12 DIAGNOSIS — I10 Essential (primary) hypertension: Secondary | ICD-10-CM | POA: Diagnosis not present

## 2020-01-12 DIAGNOSIS — I25708 Atherosclerosis of coronary artery bypass graft(s), unspecified, with other forms of angina pectoris: Secondary | ICD-10-CM | POA: Diagnosis not present

## 2020-01-12 DIAGNOSIS — Z6841 Body Mass Index (BMI) 40.0 and over, adult: Secondary | ICD-10-CM | POA: Diagnosis not present

## 2020-01-12 DIAGNOSIS — G4733 Obstructive sleep apnea (adult) (pediatric): Secondary | ICD-10-CM | POA: Diagnosis not present

## 2020-01-27 ENCOUNTER — Ambulatory Visit (INDEPENDENT_AMBULATORY_CARE_PROVIDER_SITE_OTHER): Payer: Medicare Other | Admitting: Podiatry

## 2020-01-27 ENCOUNTER — Other Ambulatory Visit: Payer: Self-pay

## 2020-01-27 ENCOUNTER — Encounter: Payer: Self-pay | Admitting: Podiatry

## 2020-01-27 VITALS — Temp 97.3°F

## 2020-01-27 DIAGNOSIS — L97512 Non-pressure chronic ulcer of other part of right foot with fat layer exposed: Secondary | ICD-10-CM | POA: Diagnosis not present

## 2020-01-27 DIAGNOSIS — R600 Localized edema: Secondary | ICD-10-CM

## 2020-01-27 DIAGNOSIS — E0842 Diabetes mellitus due to underlying condition with diabetic polyneuropathy: Secondary | ICD-10-CM

## 2020-01-30 DIAGNOSIS — H353221 Exudative age-related macular degeneration, left eye, with active choroidal neovascularization: Secondary | ICD-10-CM | POA: Diagnosis not present

## 2020-01-31 NOTE — Progress Notes (Signed)
   Subjective:  69 y.o. male with PMHx of diabetes mellitus presenting today for follow up evaluation of an ulceration of the right sub-fifth MPJ. He reports some improvement. He reports some continued tenderness around the wound. He has been applying Santyl ointment and using the CAM boot as directed. He denies any worsening factors at this time. Patient is here for further evaluation and treatment.   Past Medical History:  Diagnosis Date  . CAD (coronary artery disease)    07/2007  . Diabetes ([place])   . Elevated lipids   . Glaucoma   . HBP (high blood pressure)   . Neuropathy   . Osteoarthritis   . Skin cancer   . Sleep apnea    CPAP      Objective/Physical Exam General: The patient is alert and oriented x3 in no acute distress.  Dermatology:  Wound #1 noted to the sub-fifth MPJ of the right foot measuring 1.0 x 1.0 x 0.2 cm (LxWxD).   To the noted ulceration(s), there is no eschar. There is a moderate amount of slough, fibrin, and necrotic tissue noted. Granulation tissue and wound base is red. There is a minimal amount of serosanguineous drainage noted. There is no exposed bone muscle-tendon ligament or joint. There is no malodor. Periwound integrity is intact.   Skin is warm, dry and supple bilateral lower extremities.  Vascular: Edema noted to the right lower extremity. Palpable pedal pulses bilaterally. No erythema noted. Capillary refill within normal limits.  Neurological: Epicritic and protective threshold diminished bilaterally.   Musculoskeletal Exam: Range of motion within normal limits to all pedal and ankle joints bilateral. Muscle strength 5/5 in all groups bilateral.    Assessment: 1. 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 diabetes mellitus w/ peripheral neuropathy 3. Edema RLE    Plan of Care:  1. Patient was evaluated. 2. Unna boot applied. Keep clean, dry and intact for one week.  3. Then resume using Santyl daily.  4.  Return to clinic in 3 weeks.     [name], DPM [facility]  Dr. [name], [address]                                        [place], [place] [address]                Office [phone]  Fax [phone]

## 2020-02-17 ENCOUNTER — Ambulatory Visit: Payer: Medicare Other | Admitting: Podiatry

## 2020-02-21 ENCOUNTER — Other Ambulatory Visit: Payer: Self-pay

## 2020-02-21 ENCOUNTER — Ambulatory Visit (INDEPENDENT_AMBULATORY_CARE_PROVIDER_SITE_OTHER): Payer: Medicare Other | Admitting: Podiatry

## 2020-02-21 DIAGNOSIS — L97512 Non-pressure chronic ulcer of other part of right foot with fat layer exposed: Secondary | ICD-10-CM | POA: Diagnosis not present

## 2020-02-21 DIAGNOSIS — E0842 Diabetes mellitus due to underlying condition with diabetic polyneuropathy: Secondary | ICD-10-CM

## 2020-02-21 NOTE — Patient Instructions (Signed)
Pre-Operative Instructions  Congratulations, you have decided to take an important step towards improving your quality of life.  You can be assured that the doctors and staff at [facility] will be with you every step of the way.  Here are some important things you should know:  1. Plan to be at the surgery center/hospital at least 1 (one) hour prior to your scheduled time, unless otherwise directed by the surgical center/hospital staff.  You must have a responsible adult accompany you, remain during the surgery and drive you home.  Make sure you have directions to the surgical center/hospital to ensure you arrive on time. 2. If you are having surgery at [facility] or [facility] hospitals, you will need a copy of your medical history and physical form from your family physician within one month prior to the date of surgery. We will give you a form for your primary physician to complete.  3. We make every effort to accommodate the date you request for surgery.  However, there are times where surgery dates or times have to be moved.  We will contact you as soon as possible if a change in schedule is required.   4. No aspirin/ibuprofen for one week before surgery.  If you are on aspirin, any non-steroidal anti-inflammatory medications (Mobic, Aleve, Ibuprofen) should not be taken seven (7) days prior to your surgery.  You make take Tylenol for pain prior to surgery.  5. Medications - If you are taking daily heart and blood pressure medications, seizure, reflux, allergy, asthma, anxiety, pain or diabetes medications, make sure you notify the surgery center/hospital before the day of surgery so they can tell you which medications you should take or avoid the day of surgery. 6. No food or drink after midnight the night before surgery unless directed otherwise by surgical center/hospital staff. 7. No alcoholic beverages 24-hours prior to surgery.  No smoking 24-hours prior or 24-hours after  surgery. 8. Wear loose pants or shorts. They should be loose enough to fit over bandages, boots, and casts. 9. Don't wear slip-on shoes. Sneakers are preferred. 10. Bring your boot with you to the surgery center/hospital.  Also bring crutches or a walker if your physician has prescribed it for you.  If you do not have this equipment, it will be provided for you after surgery. 11. If you have not been contacted by the surgery center/hospital by the day before your surgery, call to confirm the date and time of your surgery. 12. Leave-time from work may vary depending on the type of surgery you have.  Appropriate arrangements should be made prior to surgery with your employer. 13. Prescriptions will be provided immediately following surgery by your doctor.  Fill these as soon as possible after surgery and take the medication as directed. Pain medications will not be refilled on weekends and must be approved by the doctor. 14. Remove nail polish on the operative foot and avoid getting pedicures prior to surgery. 15. Wash the night before surgery.  The night before surgery wash the foot and leg well with water and the antibacterial soap provided. Be sure to pay special attention to beneath the toenails and in between the toes.  Wash for at least three (3) minutes. Rinse thoroughly with water and dry well with a towel.  Perform this wash unless told not to do so by your physician.  Enclosed: 1 Ice pack (please put in freezer the night before surgery)   1 Hibiclens skin cleaner     Pre-op instructions  If you have any questions regarding the instructions, please do not hesitate to call our office.  [place]: [address], [place], [place] [address] -- [phone]  [place]: [address] [place], [place] [address] -- [phone]  [place]: [address], , [place] [address] -- [phone]   Website: [email or web address] 

## 2020-02-23 DIAGNOSIS — Z96652 Presence of left artificial knee joint: Secondary | ICD-10-CM | POA: Diagnosis not present

## 2020-02-23 DIAGNOSIS — M1712 Unilateral primary osteoarthritis, left knee: Secondary | ICD-10-CM | POA: Diagnosis not present

## 2020-02-23 DIAGNOSIS — L97511 Non-pressure chronic ulcer of other part of right foot limited to breakdown of skin: Secondary | ICD-10-CM | POA: Diagnosis not present

## 2020-02-23 NOTE — Progress Notes (Signed)
   Subjective:  69 y.o. male with PMHx of diabetes mellitus presenting today for follow up evaluation of an ulceration of the right sub-fifth MPJ. He reports some improvement and states the wound has gotten smaller. He reports continued pain. He has been using Santyl as directed. Patient is here for further evaluation and treatment.   Past Medical History:  Diagnosis Date  . CAD (coronary artery disease)    07/2007  . Diabetes ([place])   . Elevated lipids   . Glaucoma   . HBP (high blood pressure)   . Neuropathy   . Osteoarthritis   . Skin cancer   . Sleep apnea    CPAP      Objective/Physical Exam General: The patient is alert and oriented x3 in no acute distress.  Dermatology:  Wound #1 noted to the sub-fifth MPJ of the right foot measuring 1.0 x 1.0 x 0.2 cm (LxWxD).   To the noted ulceration(s), there is no eschar. There is a moderate amount of slough, fibrin, and necrotic tissue noted. Granulation tissue and wound base is red. There is a minimal amount of serosanguineous drainage noted. There is no exposed bone muscle-tendon ligament or joint. There is no malodor. Periwound integrity is intact.   Skin is warm, dry and supple bilateral lower extremities.  Vascular: Edema noted to the right lower extremity. Palpable pedal pulses bilaterally. No erythema noted. Capillary refill within normal limits.  Neurological: Epicritic and protective threshold diminished bilaterally.   Musculoskeletal Exam: Pain with palpation noted to the 5th MPJ of the right foot. Range of motion within normal limits to all pedal and ankle joints bilateral. Muscle strength 5/5 in all groups bilateral.    Assessment: 1. 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 diabetes mellitus w/ peripheral neuropathy 3. Edema RLE  4. 5th MPJ capsulitis right    Plan of Care:  1. Patient was evaluated. 2. Medically necessary excisional debridement including subcutaneous tissue was  performed using a tissue nipper and a chisel blade. Excisional debridement of all the necrotic nonviable tissue down to healthy bleeding viable tissue was performed with post-debridement measurements same as pre-. 3. The wound was cleansed and dry sterile dressing applied. 4. Today we discussed the conservative versus surgical management of the presenting pathology. The patient opts for surgical management. All possible complications and details of the procedure were explained. All patient questions were answered. No guarantees were expressed or implied. 5. Authorization for surgery was initiated today. Surgery will consist of 5th metatarsal head resection right.  6. Continue daily home dressings.  7. Return to clinic one week post op.   [name], DPM [facility]  Dr. [name], [place]                                        [place], [place] [address]                Office [phone]  Fax [phone]

## 2020-02-26 DIAGNOSIS — F411 Generalized anxiety disorder: Secondary | ICD-10-CM | POA: Insufficient documentation

## 2020-02-26 DIAGNOSIS — L570 Actinic keratosis: Secondary | ICD-10-CM | POA: Insufficient documentation

## 2020-02-26 DIAGNOSIS — M545 Low back pain, unspecified: Secondary | ICD-10-CM | POA: Insufficient documentation

## 2020-02-26 DIAGNOSIS — G4730 Sleep apnea, unspecified: Secondary | ICD-10-CM | POA: Insufficient documentation

## 2020-02-26 DIAGNOSIS — C4491 Basal cell carcinoma of skin, unspecified: Secondary | ICD-10-CM | POA: Insufficient documentation

## 2020-02-26 DIAGNOSIS — H259 Unspecified age-related cataract: Secondary | ICD-10-CM | POA: Insufficient documentation

## 2020-02-26 DIAGNOSIS — F528 Other sexual dysfunction not due to a substance or known physiological condition: Secondary | ICD-10-CM | POA: Insufficient documentation

## 2020-02-26 DIAGNOSIS — J309 Allergic rhinitis, unspecified: Secondary | ICD-10-CM | POA: Insufficient documentation

## 2020-02-26 DIAGNOSIS — N529 Male erectile dysfunction, unspecified: Secondary | ICD-10-CM | POA: Insufficient documentation

## 2020-02-26 DIAGNOSIS — G4710 Hypersomnia, unspecified: Secondary | ICD-10-CM | POA: Insufficient documentation

## 2020-02-26 DIAGNOSIS — C4499 Other specified malignant neoplasm of skin, unspecified: Secondary | ICD-10-CM | POA: Insufficient documentation

## 2020-02-28 DIAGNOSIS — I872 Venous insufficiency (chronic) (peripheral): Secondary | ICD-10-CM | POA: Diagnosis not present

## 2020-02-28 DIAGNOSIS — L309 Dermatitis, unspecified: Secondary | ICD-10-CM | POA: Diagnosis not present

## 2020-02-29 ENCOUNTER — Other Ambulatory Visit: Payer: Self-pay

## 2020-02-29 ENCOUNTER — Ambulatory Visit (INDEPENDENT_AMBULATORY_CARE_PROVIDER_SITE_OTHER): Payer: Medicare Other | Admitting: Primary Care

## 2020-02-29 VITALS — BP 110/54 | HR 69 | Temp 98.2°F | Resp 24 | Wt 333.5 lb

## 2020-02-29 DIAGNOSIS — L299 Pruritus, unspecified: Secondary | ICD-10-CM

## 2020-02-29 HISTORY — DX: Pruritus, unspecified: L29.9

## 2020-02-29 MED ORDER — FAMOTIDINE 20 MG PO TABS: 20.0000 mg | ORAL_TABLET | Freq: Every day | ORAL | 0 refills | Status: DC

## 2020-02-29 NOTE — Assessment & Plan Note (Signed)
Acute and full body x 2 weeks. Exam today without evidence of rash. No changes in lifestyle.  He does have active allergy symptoms. No history of liver disease, no jaundice.  Treat with Claritin and famotidine. He will update.

## 2020-02-29 NOTE — Progress Notes (Signed)
Subjective:    Patient ID: Lawrence [name], male    DOB: 08/08/1951, 69 y.o.   MRN: [id number]  HPI  This visit occurred during the SARS-CoV-2 public health emergency.  Safety protocols were in place, including screening questions prior to the visit, additional usage of staff PPE, and extensive cleaning of exam room while observing appropriate contact time as indicated for disinfecting solutions.    Lawrence [name] is a 69 year old male with a history of CAD, hypertension, MI, OSA, GERD, type 1 diabetes, osteoarthritis, morbid obesity who presents today with a chief complaint of itching.  He is currently following with dermatology for chronic lower extremity rash and ulcer secondary to venous insufficiency.  His itching began about two weeks ago which he first noticed to his bilateral lower extremities, shoulders, posterior trunk, anterior trunk, breasts. He also reports rhinorrhea, itchy/watery eyes, sneezing.He has not seen a rash, but has caused irritation to his skin from itching.  He asked his dermatologist who stated that his itching is "internal" and was sent over to PCP's office.   He's tried using cortisone cream, Aveno without improvement. He did take Benadryl with some improvement, but he stopped taking Benadryl due to his glaucoma history. He is currently managed Allegra for which he's taken for quite some time. Alternates between this and Zyrtec/Claritin.   His blood sugars are ranging 40-300+, mostly mid 100's.   No new lotions, detergents, soaps or shampoos. No new medicines, vitamins, supplements. No new pets. No recent outdoor exposure or poison ivy exposure. No bonfire or smoke exposure.  No recent motel or hotel stay or new beds.   No fevers/chills, oral lesions, new joint pains, tick bites, abdominal pain, nausea.    BP Readings from Last 3 Encounters:  02/29/20 (!) 110/54  04/25/19 114/70  11/04/18 114/70      Review of Systems  HENT: Positive for rhinorrhea.  Negative for sneezing.   Eyes: Positive for itching.  Respiratory: Negative for cough.   Skin: Negative for rash.       Itching, full body.  Allergic/Immunologic: Positive for environmental allergies.       Past Medical History:  Diagnosis Date  . CAD (coronary artery disease)    07/2007  . Diabetes ([place])   . Elevated lipids   . Glaucoma   . HBP (high blood pressure)   . Neuropathy   . Osteoarthritis   . Skin cancer   . Sleep apnea    CPAP     Social History   Socioeconomic History  . Marital status: Married    Spouse name: Not on file  . Number of children: Not on file  . Years of education: Not on file  . Highest education level: Not on file  Occupational History  . Not on file  Tobacco Use  . Smoking status: Former Smoker    Packs/day: 1.00    Years: 16.00    Pack years: 16.00    Types: Cigarettes    Start date: 11/03/1969    Quit date: 07/22/1986    Years since quitting: 33.6  . Smokeless tobacco: Never Used  Substance and Sexual Activity  . Alcohol use: Not Currently    Alcohol/week: 0.0 standard drinks    Comment: OCCASIONALLY  . Drug use: No  . Sexual activity: Never  Other Topics Concern  . Not on file  Social History Narrative   Married.  Lives at home with wife.    Social Determinants of Health  Financial Resource Strain:   . Difficulty of Paying Living Expenses:   Food Insecurity:   . Worried About Charity fundraiser in the Last Year:   . Arboriculturist in the Last Year:   Transportation Needs:   . Film/video editor (Medical):   [name] Lack of Transportation (Non-Medical):   Physical Activity:   . Days of Exercise per Week:   . Minutes of Exercise per Session:   Stress:   . Feeling of Stress :   Social Connections:   . Frequency of Communication with Friends and Family:   . Frequency of Social Gatherings with Friends and Family:   . Attends Religious Services:   . Active Member of Clubs or Organizations:   . Attends Theatre manager Meetings:   [name] Marital Status:   Intimate Partner Violence:   . Fear of Current or Ex-Partner:   . Emotionally Abused:   [name] Physically Abused:   . Sexually Abused:     Past Surgical History:  Procedure Laterality Date  . APPENDECTOMY    . CARDIAC CATHETERIZATION N/A 06/11/2016   Procedure: Left Heart Cath and Coronary Angiography;  Surgeon: Corey Skains, MD;  Location: [facility];  Service: Cardiovascular;  Laterality: N/A;  . CARPAL TUNNEL RELEASE Left 04/24/2016   Procedure: CARPAL TUNNEL RELEASE;  Surgeon: Hessie Knows, MD;  Location: [facility];  Service: Orthopedics;  Laterality: Left;  . CATARACT EXTRACTION W/ INTRAOCULAR LENS  IMPLANT, BILATERAL Bilateral   . CATARACT EXTRACTION, BILATERAL     R eye 07/16/12, L eye 08/13/12 - with lens implant in both eyes  . CORONARY ARTERY BYPASS GRAFT N/A 06/27/2016   Procedure: CORONARY ARTERY BYPASS GRAFTING times four using left internal mammary artery and right leg saphenous vein;  Surgeon: Ivin Poot, MD;  Location: [facility];  Service: Open Heart Surgery;  Laterality: N/A;  . EYE SURGERY Bilateral   . JOINT REPLACEMENT    . KNEE ARTHROPLASTY Left 02/11/2016   Procedure: COMPUTER ASSISTED TOTAL KNEE ARTHROPLASTY;  Surgeon: Dereck Leep, MD;  Location: [facility];  Service: Orthopedics;  Laterality: Left;  . KNEE ARTHROSCOPY Right   . LEFT HEART CATH AND CORS/GRAFTS ANGIOGRAPHY Left 01/13/2017   Procedure: Left Heart Cath and Cors/Grafts Angiography;  Surgeon: Corey Skains, MD;  Location: [facility];  Service: Cardiovascular;  Laterality: Left;  . TEE WITHOUT CARDIOVERSION N/A 06/27/2016   Procedure: TRANSESOPHAGEAL ECHOCARDIOGRAM (TEE);  Surgeon: Ivin Poot, MD;  Location: [facility];  Service: Open Heart Surgery;  Laterality: N/A;    Family History  Problem Relation Age of Onset  . Drug abuse Brother   . Hypertension Mother   . Lupus Mother   . Hypertension Father   . Diabetes Father   . Heart  attack Father 63       died in his 80s  . Arthritis Maternal Grandmother   . Arthritis Maternal Grandfather   . Arthritis Paternal Grandmother   . Arthritis Paternal Grandfather   . Heart disease Brother     Allergies  Allergen Reactions  . Nitroglycerin Nausea Only and Other (See Comments)    Patches only - headache Patches only - headache  . Statins Other (See Comments)    BODY PAIN  Other reaction(s): Muscle Pain BODY PAIN   . Lipitor [Atorvastatin] Other (See Comments) and Rash    BODY PAIN  . Lisinopril Other (See Comments) and Nausea Only    Light headed, dizzy  Other reaction(s): Dizziness    Current Outpatient Medications on File Prior to Visit  Medication Sig Dispense Refill  . aspirin 81 MG EC tablet Take 81 mg by mouth daily. Swallow whole.    [name] aspirin-acetaminophen-caffeine (EXCEDRIN MIGRAINE) 250-250-65 MG tablet Take by mouth every 6 (six) hours as needed for headache.    . cholecalciferol (VITAMIN D) 1000 units tablet Take 1,000 Units by mouth daily.    . clopidogrel (PLAVIX) 75 MG tablet Take 1 tablet (75 mg total) by mouth daily. 30 tablet 0  . collagenase (SANTYL) ointment Apply 1 application topically daily. 30 g 1  . fexofenadine (ALLEGRA) 180 MG tablet Take 180 mg by mouth daily.    . furosemide (LASIX) 40 MG tablet Take 1 tablet (40 mg total) by mouth daily. 30 tablet 1  . glucagon (GLUCAGON EMERGENCY) 1 MG injection Inject 1 mg into the vein once as needed. Reported on 02/11/2016    . GLUTOSE 15 40 % GEL     . insulin aspart (NOVOLOG) 100 UNIT/ML injection Inject 20 Units into the skin 3 (three) times daily. (Patient taking differently: Inject 20 Units into the skin 3 (three) times daily with meals. ) 10 mL 5  . insulin glargine (LANTUS) 100 UNIT/ML injection Inject 0.25 mLs (25 Units total) into the skin at bedtime. 30 mL 1  . isosorbide mononitrate (IMDUR) 30 MG 24 hr tablet Take 30 mg by mouth daily.    [name] l-methylfolate-B6-B12 (METANX) 3-35-2 MG TABS  tablet Take 1 tablet by mouth daily. 60 tablet 3  . LANTUS 100 UNIT/ML injection INJECT 24 UNITS UNDER THE SKIN AT BEDTIME 30 mL 3  . latanoprost (XALATAN) 0.005 % ophthalmic solution Place 1 drop into both eyes at bedtime.    . metoprolol tartrate (LOPRESSOR) 25 MG tablet Take 25 mg by mouth 2 (two) times daily.    . Multiple Vitamin (MULTIVITAMIN) capsule Take 1 capsule by mouth daily.    . nitroGLYCERIN (NITROSTAT) 0.4 MG SL tablet DISSOLVE 1 TABLET UNDER THE TONGUE EVERY 5 MINUTES AS NEEDED FOR CHEST PAIN 2 tablet 6  . potassium chloride SA (K-DUR,KLOR-CON) 20 MEQ tablet Take 1 tablet (20 mEq total) by mouth daily. 30 tablet 1  . pregabalin (LYRICA) 150 MG capsule Take 1 capsule (150 mg total) by mouth 2 (two) times daily. 60 capsule 0  . ranolazine (RANEXA) 500 MG 12 hr tablet Take 1 tablet (500 mg total) by mouth 2 (two) times daily. 60 tablet 0  . rosuvastatin (CRESTOR) 20 MG tablet Take 1 tablet (20 mg total) by mouth daily at 6 PM. 30 tablet 1  . Travoprost, BAK Free, (TRAVATAN) 0.004 % SOLN ophthalmic solution     . triamcinolone ointment (KENALOG) 0.1 % Apply 1 application topically 2 (two) times daily.    . vitamin C (ASCORBIC ACID) 500 MG tablet Take 500 mg by mouth 2 (two) times daily.     [name] gentamicin cream (GARAMYCIN) 0.1 % Apply 1 application topically 3 (three) times daily. (Patient not taking: Reported on 02/29/2020) 30 g 1   No current facility-administered medications on file prior to visit.    BP (!) 110/54   Pulse 69   Temp 98.2 F (36.8 C)   Resp (!) 24   Wt (!) 333 lb 8 oz (151.3 kg)   SpO2 98%   BMI 47.51 kg/m    Objective:   Physical Exam  Cardiovascular: Normal rate.  Respiratory: Effort normal.  Skin: Skin is warm and dry. There  is erythema.  Evidence of PVD, superficially open sores. No drainage. Mild erythema.   No rash to upper body or areas of concern.            Assessment & Plan:

## 2020-02-29 NOTE — Patient Instructions (Signed)
Start famotidine 20 mg once daily for itching. Take this along with Claritin once daily for itching.  Please update me in one week.  It was a pleasure to see you today!

## 2020-03-05 DIAGNOSIS — H353221 Exudative age-related macular degeneration, left eye, with active choroidal neovascularization: Secondary | ICD-10-CM | POA: Diagnosis not present

## 2020-03-06 ENCOUNTER — Other Ambulatory Visit: Payer: Self-pay | Admitting: Podiatry

## 2020-03-06 ENCOUNTER — Other Ambulatory Visit: Payer: Self-pay

## 2020-03-06 ENCOUNTER — Ambulatory Visit (INDEPENDENT_AMBULATORY_CARE_PROVIDER_SITE_OTHER): Payer: Medicare Other | Admitting: Podiatry

## 2020-03-06 DIAGNOSIS — L97512 Non-pressure chronic ulcer of other part of right foot with fat layer exposed: Secondary | ICD-10-CM

## 2020-03-06 DIAGNOSIS — E0842 Diabetes mellitus due to underlying condition with diabetic polyneuropathy: Secondary | ICD-10-CM | POA: Diagnosis not present

## 2020-03-08 ENCOUNTER — Ambulatory Visit (INDEPENDENT_AMBULATORY_CARE_PROVIDER_SITE_OTHER): Payer: Medicare Other | Admitting: Primary Care

## 2020-03-08 ENCOUNTER — Other Ambulatory Visit: Payer: Self-pay

## 2020-03-08 ENCOUNTER — Encounter: Payer: Self-pay | Admitting: Primary Care

## 2020-03-08 VITALS — BP 116/78 | HR 82 | Temp 96.9°F | Ht 70.25 in | Wt 337.0 lb

## 2020-03-08 DIAGNOSIS — E109 Type 1 diabetes mellitus without complications: Secondary | ICD-10-CM | POA: Diagnosis not present

## 2020-03-08 DIAGNOSIS — L97519 Non-pressure chronic ulcer of other part of right foot with unspecified severity: Secondary | ICD-10-CM

## 2020-03-08 DIAGNOSIS — L97509 Non-pressure chronic ulcer of other part of unspecified foot with unspecified severity: Secondary | ICD-10-CM | POA: Insufficient documentation

## 2020-03-08 DIAGNOSIS — Z01818 Encounter for other preprocedural examination: Secondary | ICD-10-CM | POA: Diagnosis not present

## 2020-03-08 DIAGNOSIS — L97522 Non-pressure chronic ulcer of other part of left foot with fat layer exposed: Secondary | ICD-10-CM | POA: Insufficient documentation

## 2020-03-08 DIAGNOSIS — I10 Essential (primary) hypertension: Secondary | ICD-10-CM

## 2020-03-08 LAB — COMPREHENSIVE METABOLIC PANEL
ALT: 13 U/L (ref 0–53)
AST: 17 U/L (ref 0–37)
Albumin: 4 g/dL (ref 3.5–5.2)
Alkaline Phosphatase: 90 U/L (ref 39–117)
BUN: 23 mg/dL (ref 6–23)
CO2: 32 mEq/L (ref 19–32)
Calcium: 8.9 mg/dL (ref 8.4–10.5)
Chloride: 103 mEq/L (ref 96–112)
Creatinine, Ser: 1.35 mg/dL (ref 0.40–1.50)
GFR: 52.07 mL/min — ABNORMAL LOW (ref >60.00)
Glucose, Bld: 60 mg/dL — ABNORMAL LOW (ref 70–99)
Potassium: 4 mEq/L (ref 3.5–5.1)
Sodium: 139 mEq/L (ref 135–145)
Total Bilirubin: 0.6 mg/dL (ref 0.2–1.2)
Total Protein: 7 g/dL (ref 6.0–8.3)

## 2020-03-08 LAB — CBC
HCT: 37.9 % — ABNORMAL LOW (ref 39.0–52.0)
Hemoglobin: 12.8 g/dL — ABNORMAL LOW (ref 13.0–17.0)
MCHC: 33.7 g/dL (ref 30.0–36.0)
MCV: 97.3 fl (ref 78.0–100.0)
Platelets: 242 10*3/uL (ref 150.0–400.0)
RBC: 3.9 Mil/uL — ABNORMAL LOW (ref 4.22–5.81)
RDW: 13.9 % (ref 11.5–15.5)
WBC: 7.9 10*3/uL (ref 4.0–10.5)

## 2020-03-08 LAB — WOUND CULTURE: Organism ID, Bacteria: NONE SEEN

## 2020-03-08 LAB — HEMOGLOBIN A1C: Hgb A1c MFr Bld: 7.3 % — ABNORMAL HIGH (ref 4.6–6.5)

## 2020-03-08 NOTE — Assessment & Plan Note (Addendum)
Scheduled on 03/21/20 for metatarsal head resection and ulcer debridement on right foot.   Exam today stable, no acute flares. Labs pending. ECG today with NSR with rate of 62, no acute ST changes, T-wave inversion. Appears similar to ECG from 2018  Surgically cleared pending labs.

## 2020-03-08 NOTE — Patient Instructions (Addendum)
Stop by the lab prior to leaving today. I will notify you of your results once received.   It was a pleasure to see you today!  

## 2020-03-08 NOTE — Assessment & Plan Note (Signed)
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 Followed by podiatry who will be preforming surgery on 03/21/20 for ulcer debridement and resection of right metatarsal.  Exam today as noted. He has numerous chronic medical conditions without any recent flares. He appears stable today. Cleared for surgery pending labs.

## 2020-03-08 NOTE — Progress Notes (Signed)
Subjective:    Patient ID: Lawrence [name], male    DOB: September 06, 1951, 69 y.o.   MRN: [id number]  HPI  This visit occurred during the SARS-CoV-2 public health emergency.  Safety protocols were in place, including screening questions prior to the visit, additional usage of staff PPE, and extensive cleaning of exam room while observing appropriate contact time as indicated for disinfecting solutions.   Lawrence [name] is a 69 year old male with a medical history of CAD, hypertension, OSA, GERD, myocardial infarction, type 1 diabetes, morbid obesity, chronic foot ulcer needing surgical clearance.  He will undergo metatarsal head resection on the right and debridement of the ulcer on 03/21/20 at [facility]. He will undergo general anesthesia.     Review of Systems  Constitutional: Negative for unexpected weight change.  Eyes:       Macular degeneration, undergoing treatment  Respiratory:       Chronic dyspnea, no changes  Cardiovascular:       No new chest pain  Gastrointestinal: Negative for abdominal pain, constipation and diarrhea.  Skin: Positive for wound.       Chronic ulcer to right foot  Neurological:       No changes in dizziness        Past Medical History:  Diagnosis Date  . CAD (coronary artery disease)    07/2007  . Diabetes ([place])   . Elevated lipids   . Glaucoma   . HBP (high blood pressure)   . Neuropathy   . Osteoarthritis   . Skin cancer   . Sleep apnea    CPAP     Social History   Socioeconomic History  . Marital status: Married    Spouse name: Not on file  . Number of children: Not on file  . Years of education: Not on file  . Highest education level: Not on file  Occupational History  . Not on file  Tobacco Use  . Smoking status: Former Smoker    Packs/day: 1.00    Years: 16.00    Pack years: 16.00    Types: Cigarettes    Start date: 11/03/1969    Quit date: 07/22/1986    Years since quitting: 33.6  . Smokeless tobacco: Never Used   Substance and Sexual Activity  . Alcohol use: Not Currently    Alcohol/week: 0.0 standard drinks    Comment: OCCASIONALLY  . Drug use: No  . Sexual activity: Never  Other Topics Concern  . Not on file  Social History Narrative   Married.  Lives at home with wife.    Social Determinants of Health   Financial Resource Strain:   . Difficulty of Paying Living Expenses:   Food Insecurity:   . Worried About Charity fundraiser in the Last Year:   . Arboriculturist in the Last Year:   Transportation Needs:   . Film/video editor (Medical):   [name] Lack of Transportation (Non-Medical):   Physical Activity:   . Days of Exercise per Week:   . Minutes of Exercise per Session:   Stress:   . Feeling of Stress :   Social Connections:   . Frequency of Communication with Friends and Family:   . Frequency of Social Gatherings with Friends and Family:   . Attends Religious Services:   . Active Member of Clubs or Organizations:   . Attends Archivist Meetings:   [name] Marital Status:   Intimate Partner Violence:   .  Fear of Current or Ex-Partner:   . Emotionally Abused:   [name] Physically Abused:   . Sexually Abused:     Past Surgical History:  Procedure Laterality Date  . APPENDECTOMY    . CARDIAC CATHETERIZATION N/A 06/11/2016   Procedure: Left Heart Cath and Coronary Angiography;  Surgeon: Corey Skains, MD;  Location: [place] [facility];  Service: Cardiovascular;  Laterality: N/A;  . CARPAL TUNNEL RELEASE Left 04/24/2016   Procedure: CARPAL TUNNEL RELEASE;  Surgeon: Hessie Knows, MD;  Location: [facility];  Service: Orthopedics;  Laterality: Left;  . CATARACT EXTRACTION W/ INTRAOCULAR LENS  IMPLANT, BILATERAL Bilateral   . CATARACT EXTRACTION, BILATERAL     R eye 07/16/12, L eye 08/13/12 - with lens implant in both eyes  . CORONARY ARTERY BYPASS GRAFT N/A 06/27/2016   Procedure: CORONARY ARTERY BYPASS GRAFTING times four using left internal mammary artery and right leg saphenous  vein;  Surgeon: Ivin Poot, MD;  Location: [place];  Service: Open Heart Surgery;  Laterality: N/A;  . EYE SURGERY Bilateral   . JOINT REPLACEMENT    . KNEE ARTHROPLASTY Left 02/11/2016   Procedure: COMPUTER ASSISTED TOTAL KNEE ARTHROPLASTY;  Surgeon: Dereck Leep, MD;  Location: [facility];  Service: Orthopedics;  Laterality: Left;  . KNEE ARTHROSCOPY Right   . LEFT HEART CATH AND CORS/GRAFTS ANGIOGRAPHY Left 01/13/2017   Procedure: Left Heart Cath and Cors/Grafts Angiography;  Surgeon: Corey Skains, MD;  Location: [place] [facility];  Service: Cardiovascular;  Laterality: Left;  . TEE WITHOUT CARDIOVERSION N/A 06/27/2016   Procedure: TRANSESOPHAGEAL ECHOCARDIOGRAM (TEE);  Surgeon: Ivin Poot, MD;  Location: [place];  Service: Open Heart Surgery;  Laterality: N/A;    Family History  Problem Relation Age of Onset  . Drug abuse Brother   . Hypertension Mother   . Lupus Mother   . Hypertension Father   . Diabetes Father   . Heart attack Father 71       died in his 36s  . Arthritis Maternal Grandmother   . Arthritis Maternal Grandfather   . Arthritis Paternal Grandmother   . Arthritis Paternal Grandfather   . Heart disease Brother     Allergies  Allergen Reactions  . Nitroglycerin Nausea Only and Other (See Comments)    Patches only - headache Patches only - headache  . Statins Other (See Comments)    BODY PAIN  Other reaction(s): Muscle Pain BODY PAIN   . Lipitor [Atorvastatin] Other (See Comments) and Rash    BODY PAIN  . Lisinopril Other (See Comments) and Nausea Only    Light headed, dizzy Other reaction(s): Dizziness    Current Outpatient Medications on File Prior to Visit  Medication Sig Dispense Refill  . aspirin 81 MG EC tablet Take 81 mg by mouth daily. Swallow whole.    [name] aspirin-acetaminophen-caffeine (EXCEDRIN MIGRAINE) 250-250-65 MG tablet Take by mouth every 6 (six) hours as needed for headache.    . cholecalciferol (VITAMIN D) 1000 units tablet  Take 1,000 Units by mouth daily.    . clopidogrel (PLAVIX) 75 MG tablet Take 1 tablet (75 mg total) by mouth daily. 30 tablet 0  . collagenase (SANTYL) ointment Apply 1 application topically daily. 30 g 1  . famotidine (PEPCID) 20 MG tablet Take 1 tablet (20 mg total) by mouth daily. 30 tablet 0  . furosemide (LASIX) 40 MG tablet Take 1 tablet (40 mg total) by mouth daily. 30 tablet 1  . gentamicin cream (GARAMYCIN)  0.1 % Apply 1 application topically 3 (three) times daily. 30 g 1  . glucagon (GLUCAGON EMERGENCY) 1 MG injection Inject 1 mg into the vein once as needed. Reported on 02/11/2016    . GLUTOSE 15 40 % GEL     . insulin aspart (NOVOLOG) 100 UNIT/ML injection Inject 20 Units into the skin 3 (three) times daily. (Patient taking differently: Inject 20 Units into the skin 3 (three) times daily with meals. ) 10 mL 5  . insulin glargine (LANTUS) 100 UNIT/ML injection Inject 0.25 mLs (25 Units total) into the skin at bedtime. 30 mL 1  . isosorbide mononitrate (IMDUR) 30 MG 24 hr tablet Take 30 mg by mouth daily.    [name] l-methylfolate-B6-B12 (METANX) 3-35-2 MG TABS tablet Take 1 tablet by mouth daily. 60 tablet 3  . latanoprost (XALATAN) 0.005 % ophthalmic solution Place 1 drop into both eyes at bedtime.    [name] loratadine (CLARITIN) 10 MG tablet Take 10 mg by mouth daily.    . metoprolol tartrate (LOPRESSOR) 25 MG tablet Take 25 mg by mouth 2 (two) times daily.    . Multiple Vitamin (MULTIVITAMIN) capsule Take 1 capsule by mouth daily.    . nitroGLYCERIN (NITROSTAT) 0.4 MG SL tablet DISSOLVE 1 TABLET UNDER THE TONGUE EVERY 5 MINUTES AS NEEDED FOR CHEST PAIN 2 tablet 6  . potassium chloride SA (K-DUR,KLOR-CON) 20 MEQ tablet Take 1 tablet (20 mEq total) by mouth daily. 30 tablet 1  . ranolazine (RANEXA) 500 MG 12 hr tablet Take 1 tablet (500 mg total) by mouth 2 (two) times daily. 60 tablet 0  . rosuvastatin (CRESTOR) 20 MG tablet Take 1 tablet (20 mg total) by mouth daily at 6 PM. 30 tablet 1  .  Travoprost, BAK Free, (TRAVATAN) 0.004 % SOLN ophthalmic solution     . triamcinolone ointment (KENALOG) 0.1 % Apply 1 application topically 2 (two) times daily.    . vitamin C (ASCORBIC ACID) 500 MG tablet Take 500 mg by mouth 2 (two) times daily.      No current facility-administered medications on file prior to visit.    BP 116/78   Pulse 82   Temp (!) 96.9 F (36.1 C) (Temporal)   Ht 5' 10.25" (1.784 m)   Wt (!) 337 lb (152.9 kg)   SpO2 98%   BMI 48.01 kg/m    Objective:   Physical Exam  Constitutional: He is oriented to person, place, and time. He appears well-nourished.  HENT:  Right Ear: Tympanic membrane and ear canal normal.  Left Ear: Tympanic membrane and ear canal normal.  Mouth/Throat: Oropharynx is clear and moist.  Eyes: EOM are normal.  Cardiovascular: Normal rate and regular rhythm.  Respiratory: Effort normal and breath sounds normal.  GI: Soft. Bowel sounds are normal. There is no abdominal tenderness.  Musculoskeletal:     Cervical back: Neck supple.     Comments: Ambulates well with cane.   Neurological: He is alert and oriented to person, place, and time. No cranial nerve deficit.  Reflex Scores:      Patellar reflexes are 2+ on the right side and 2+ on the left side. Skin: Skin is warm and dry.  Ulcer to right plantar foot at 5th metatarsal region. No drainage, erythema.   Psychiatric: He has a normal mood and affect.           Assessment & Plan:

## 2020-03-08 NOTE — Assessment & Plan Note (Signed)
Followed by endocrinology.  Repeat A1C pending.

## 2020-03-12 ENCOUNTER — Encounter: Payer: Self-pay | Admitting: *Deleted

## 2020-03-12 NOTE — H&P (View-Only) (Signed)
   Subjective:  69 y.o. male with PMHx of diabetes mellitus presenting today for follow up evaluation of an ulceration of the right sub-fifth MPJ. He states the wound looks improved and is getting smaller. He has been cleaning the ulcer and changing the dressing daily. He is here to get a culture of the wound prior to surgery that is scheduled on 03/15/2020. Patient is here for further evaluation and treatment.   Past Medical History:  Diagnosis Date  . CAD (coronary artery disease)    07/2007  . Diabetes ([place])   . Elevated lipids   . Glaucoma   . HBP (high blood pressure)   . Neuropathy   . Osteoarthritis   . Skin cancer   . Sleep apnea    CPAP      Objective/Physical Exam General: The patient is alert and oriented x3 in no acute distress.  Dermatology:  Wound #1 noted to the sub-fifth MPJ of the right foot measuring 1.0 x 1.0 x 0.2 cm (LxWxD).   To the noted ulceration(s), there is no eschar. There is a moderate amount of slough, fibrin, and necrotic tissue noted. Granulation tissue and wound base is red. There is a minimal amount of serosanguineous drainage noted. There is no exposed bone muscle-tendon ligament or joint. There is no malodor. Periwound integrity is intact.   Skin is warm, dry and supple bilateral lower extremities.  Vascular: Edema noted to the right lower extremity. Palpable pedal pulses bilaterally. No erythema noted. Capillary refill within normal limits.  Neurological: Epicritic and protective threshold diminished bilaterally.   Musculoskeletal Exam: Pain with palpation noted to the 5th MPJ of the right foot. Range of motion within normal limits to all pedal and ankle joints bilateral. Muscle strength 5/5 in all groups bilateral.    Assessment: 1. 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 diabetes mellitus w/ peripheral neuropathy 3. Edema RLE  4. 5th MPJ capsulitis right    Plan of Care:  1. Patient was evaluated. 2.  Medically necessary excisional debridement including subcutaneous tissue was performed using a tissue nipper and a chisel blade. Excisional debridement of all the necrotic nonviable tissue down to healthy bleeding viable tissue was performed with post-debridement measurements same as pre-. 3. The wound was cleansed and dry sterile dressing applied. 4. Culture taken from wound.  5. Continue using CAM boot.  6. Surgery scheduled for Mar 15, 2020.  7. Return to clinic one week post op.   [name], DPM [facility]  Dr. [name], [place]                                        [place],  [address]                Office [phone]  Fax [phone]

## 2020-03-12 NOTE — Progress Notes (Signed)
   Subjective:  69 y.o. male with PMHx of diabetes mellitus presenting today for follow up evaluation of an ulceration of the right sub-fifth MPJ. He states the wound looks improved and is getting smaller. He has been cleaning the ulcer and changing the dressing daily. He is here to get a culture of the wound prior to surgery that is scheduled on 03/15/2020. Patient is here for further evaluation and treatment.   Past Medical History:  Diagnosis Date  . CAD (coronary artery disease)    07/2007  . Diabetes ([place])   . Elevated lipids   . Glaucoma   . HBP (high blood pressure)   . Neuropathy   . Osteoarthritis   . Skin cancer   . Sleep apnea    CPAP      Objective/Physical Exam General: The patient is alert and oriented x3 in no acute distress.  Dermatology:  Wound #1 noted to the sub-fifth MPJ of the right foot measuring 1.0 x 1.0 x 0.2 cm (LxWxD).   To the noted ulceration(s), there is no eschar. There is a moderate amount of slough, fibrin, and necrotic tissue noted. Granulation tissue and wound base is red. There is a minimal amount of serosanguineous drainage noted. There is no exposed bone muscle-tendon ligament or joint. There is no malodor. Periwound integrity is intact.   Skin is warm, dry and supple bilateral lower extremities.  Vascular: Edema noted to the right lower extremity. Palpable pedal pulses bilaterally. No erythema noted. Capillary refill within normal limits.  Neurological: Epicritic and protective threshold diminished bilaterally.   Musculoskeletal Exam: Pain with palpation noted to the 5th MPJ of the right foot. Range of motion within normal limits to all pedal and ankle joints bilateral. Muscle strength 5/5 in all groups bilateral.    Assessment: 1. 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 diabetes mellitus w/ peripheral neuropathy 3. Edema RLE  4. 5th MPJ capsulitis right    Plan of Care:  1. Patient was evaluated. 2.  Medically necessary excisional debridement including subcutaneous tissue was performed using a tissue nipper and a chisel blade. Excisional debridement of all the necrotic nonviable tissue down to healthy bleeding viable tissue was performed with post-debridement measurements same as pre-. 3. The wound was cleansed and dry sterile dressing applied. 4. Culture taken from wound.  5. Continue using CAM boot.  6. Surgery scheduled for Mar 15, 2020.  7. Return to clinic one week post op.   [name], DPM [facility]  Dr. [name], [place]                                        [place], [place] [address]                Office [phone]  Fax [phone]

## 2020-03-19 ENCOUNTER — Other Ambulatory Visit: Payer: Self-pay

## 2020-03-19 ENCOUNTER — Other Ambulatory Visit
Admission: RE | Admit: 2020-03-19 | Discharge: 2020-03-19 | Disposition: A | Discharge status: Home / Self Care | Payer: Medicare Other | Source: Ambulatory Visit | Attending: Podiatry | Admitting: Podiatry

## 2020-03-19 DIAGNOSIS — Z01812 Encounter for preprocedural laboratory examination: Secondary | ICD-10-CM | POA: Insufficient documentation

## 2020-03-19 DIAGNOSIS — Z20822 Contact with and (suspected) exposure to covid-19: Secondary | ICD-10-CM | POA: Insufficient documentation

## 2020-03-20 ENCOUNTER — Other Ambulatory Visit: Payer: Self-pay

## 2020-03-20 ENCOUNTER — Encounter (HOSPITAL_COMMUNITY): Payer: Self-pay | Admitting: Podiatry

## 2020-03-20 LAB — SARS CORONAVIRUS 2 (TAT 6-24 HRS): SARS Coronavirus 2: NEGATIVE

## 2020-03-20 NOTE — Progress Notes (Signed)
Pt denies any acute cardiopulmonary issues. Pt stated that he is under the care of Dr. Nehemiah Massed, Cardiology and Alma Friendly, NP, PCP.  Pt denies having a chest x ray in the last year. Pt made aware to stop taking  vitamins, fish oil and herbal medications. Do not take any NSAIDs ie: Ibuprofen, Advil, Naproxen (Aleve), Excedrin Motrin, BC and Goody Powder. Pt made aware top take 12 units of Lantus insulin at HS and no insulin DOS. Pt made aware to check CBG every 2 hours prior to arrival to hospital on DOS. Pt made aware to treat a CBG < 70 with glucose gel  wait 15 minutes after intervention to recheck CBG, if CBG remains < 70, call Short Stay unit to speak with a nurse. Pt reminded to quarantine. Pt verbalized understanding of all pre-op instructions. Shelly, Surgical Coordinator, stated that she will follow up with surgeon and pt regarding pre-op instructions for Aspirin and Plavix.

## 2020-03-20 NOTE — Anesthesia Preprocedure Evaluation (Addendum)
Anesthesia Evaluation  Patient identified by MRN, date of birth, ID band Patient awake    Reviewed: Allergy & Precautions, NPO status , Patient's Chart, lab work & pertinent test results  Airway Mallampati: II  TM Distance: >3 FB     Dental   Pulmonary sleep apnea , former smoker,    breath sounds clear to auscultation       Cardiovascular hypertension, + angina + CAD and + Past MI   Rhythm:Regular Rate:Normal     Neuro/Psych  Headaches,    GI/Hepatic Neg liver ROS, GERD  ,  Endo/Other  diabetes  Renal/GU negative Renal ROS     Musculoskeletal   Abdominal   Peds  Hematology   Anesthesia Other Findings   Reproductive/Obstetrics                            Anesthesia Physical Anesthesia Plan  ASA: III  Anesthesia Plan: General   Post-op Pain Management:    Induction: Intravenous  PONV Risk Score and Plan: 2 and Ondansetron and Midazolam  Airway Management Planned: Oral ETT  Additional Equipment:   Intra-op Plan:   Post-operative Plan: Possible Post-op intubation/ventilation  Informed Consent: I have reviewed the patients History and Physical, chart, labs and discussed the procedure including the risks, benefits and alternatives for the proposed anesthesia with the patient or authorized representative who has indicated his/her understanding and acceptance.     Dental advisory given  Plan Discussed with: Anesthesiologist  Anesthesia Plan Comments: (PAT note by Karoline Caldwell, PA-C: Follows with cardiology for hx of CAD s/p CABG 2017, HTN, HLD, stable chronic angina, OSA on CPAP, chronic LE edmea on Lasix. Pt last seen by Dr. Nehemiah Massed 01/12/20. Per note, "The patient has had reasonable exercise tolerance without significant cardiovascular symptoms and appears currently stable at this time. With good control of stable angina with imdur and ranexa." Stable from CV standpoint, advised 3  month followup.   Pt seen by PCP Alma Friendly, NP on 03/08/20 and cleared for surgery. Per note, "Chronic right metatarsal ulcer to plantar region. Followed by podiatry who will be preforming surgery on 03/21/20 for ulcer debridement and resection of right metatarsal. Exam today as noted. He has numerous chronic medical conditions without any recent flares. He appears stable today. Cleared for surgery pending labs."  CBC and BMET from 03/08/20 reviewed, IDDMII with A1c 7.3, labs otherwise unremarkable.   Will need DOS eval.  EKG 03/08/20: Sinus Rhythm. Rate 62. Poor R-wave progression, may be secondary to pulmonary disease, consider old anterior infarct. Low voltage.  Nuclear stress 07/22/19: The study is normal. This is a low risk study. The left ventricular ejection fraction is normal (55-65%). There was no ST segment deviation noted during stress.   Negative Lexiscan stress. LV normal No significant reversible ischemia. Low risk study   TTE 07/22/19: 1. Left ventricular ejection fraction, by visual estimation, is 55 to  60%. The left ventricle has normal function. Left ventricular septal wall  thickness was mildly increased. Mildly increased left ventricular  posterior wall thickness. There is mildly  increased left ventricular hypertrophy.  2. Global right ventricle has normal systolic function.The right  ventricular size is normal. No increase in right ventricular wall  thickness.  3. Left atrial size was not well visualized.  4. Right atrial size was not well visualized.  5. The mitral valve was not well visualized. Trace mitral valve  regurgitation.  6. The tricuspid valve is  not well visualized. Tricuspid valve  regurgitation is mild.  7. The aortic valve was not well visualized Aortic valve regurgitation is  trivial by color flow Doppler.  8. The pulmonic valve was not well visualized. Pulmonic valve  regurgitation was not assessed by color flow Doppler.  9. The  aortic root was not well visualized.  10. The interatrial septum was not assessed.   Cath 01/13/17: Assessment The patient has had progressive canadian class 3 anginal symptoms with a high probability stress test with risk factors including diabetes, high blood pressure and high cholesterol.  normal left ventricular function with ejection fraction of 55%  severe 3 vessel coronary artery disease with occluded rca, and significant stenosis of Lmain, and Om1  Graft to LAD, and Om1 are patent and graft to RCA is occluded Collateral flow is good to PDA Rca and graft to RCa not amenable to PCI  Plan Continue medical management of CAD risk factors, Additional medications for management of angina and No further cardiac intervention at this time)       Anesthesia Quick Evaluation

## 2020-03-20 NOTE — Progress Notes (Signed)
Anesthesia Chart Review: Same day workup  Follows with cardiology for hx of CAD s/p CABG 2017, HTN, HLD, stable chronic angina, OSA on CPAP, chronic LE edmea on Lasix. Pt last seen by Dr. Nehemiah Massed 01/12/20. Per note, "The patient has had reasonable exercise tolerance without significant cardiovascular symptoms and appears currently stable at this time. With good control of stable angina with imdur and ranexa." Stable from CV standpoint, advised 3 month followup.   Pt seen by PCP Alma Friendly, NP on 03/08/20 and cleared for surgery. Per note, "Chronic right metatarsal ulcer to plantar region. Followed by podiatry who will be preforming surgery on 03/21/20 for ulcer debridement and resection of right metatarsal. Exam today as noted. He has numerous chronic medical conditions without any recent flares. He appears stable today. Cleared for surgery pending labs."  CBC and BMET from 03/08/20 reviewed, IDDMII with A1c 7.3, labs otherwise unremarkable.   Will need DOS eval.  EKG 03/08/20: Sinus Rhythm. Rate 62. Poor R-wave progression, may be secondary to pulmonary disease, consider old anterior infarct. Low voltage.  Nuclear stress 07/22/19:  The study is normal.  This is a low risk study.  The left ventricular ejection fraction is normal (55-65%).  There was no ST segment deviation noted during stress.   Negative Lexiscan stress. LV normal No significant reversible ischemia. Low risk study   TTE 07/22/19: 1. Left ventricular ejection fraction, by visual estimation, is 55 to  60%. The left ventricle has normal function. Left ventricular septal wall  thickness was mildly increased. Mildly increased left ventricular  posterior wall thickness. There is mildly  increased left ventricular hypertrophy.  2. Global right ventricle has normal systolic function.The right  ventricular size is normal. No increase in right ventricular wall  thickness.  3. Left atrial size was not well visualized.   4. Right atrial size was not well visualized.  5. The mitral valve was not well visualized. Trace mitral valve  regurgitation.  6. The tricuspid valve is not well visualized. Tricuspid valve  regurgitation is mild.  7. The aortic valve was not well visualized Aortic valve regurgitation is  trivial by color flow Doppler.  8. The pulmonic valve was not well visualized. Pulmonic valve  regurgitation was not assessed by color flow Doppler.  9. The aortic root was not well visualized.  10. The interatrial septum was not assessed.   Cath 01/13/17: Assessment The patient has had progressive canadian class 3 anginal symptoms with a high probability stress test with risk factors including diabetes, high blood pressure and high cholesterol.  normal left ventricular function with ejection fraction of 55%  severe 3 vessel coronary artery disease with occluded rca, and significant stenosis of Lmain, and Om1  Graft to LAD, and Om1 are patent and graft to RCA is occluded Collateral flow is good to PDA Rca and graft to RCa not amenable to PCI  Plan Continue medical management of CAD risk factors, Additional medications for management of angina and No further cardiac intervention at this time   Wynonia Musty [facility] Short Stay Center/Anesthesiology Phone [phone] 03/20/2020 1:47 PM

## 2020-03-21 ENCOUNTER — Ambulatory Visit (HOSPITAL_COMMUNITY): Payer: Medicare Other | Admitting: Physician Assistant

## 2020-03-21 ENCOUNTER — Ambulatory Visit (HOSPITAL_COMMUNITY)
Admission: RE | Admit: 2020-03-21 | Discharge: 2020-03-21 | Disposition: A | Discharge status: Home / Self Care | Payer: Medicare Other | Attending: Podiatry | Admitting: Podiatry

## 2020-03-21 ENCOUNTER — Encounter (HOSPITAL_COMMUNITY): Payer: Self-pay | Admitting: Podiatry

## 2020-03-21 ENCOUNTER — Ambulatory Visit (HOSPITAL_COMMUNITY): Payer: Medicare Other

## 2020-03-21 ENCOUNTER — Encounter (HOSPITAL_COMMUNITY)
Admission: RE | Disposition: A | Discharge status: Home / Self Care | Payer: Self-pay | Source: Home / Self Care | Attending: Podiatry

## 2020-03-21 DIAGNOSIS — Z85828 Personal history of other malignant neoplasm of skin: Secondary | ICD-10-CM | POA: Diagnosis not present

## 2020-03-21 DIAGNOSIS — I10 Essential (primary) hypertension: Secondary | ICD-10-CM | POA: Diagnosis not present

## 2020-03-21 DIAGNOSIS — M21541 Acquired clubfoot, right foot: Secondary | ICD-10-CM | POA: Diagnosis not present

## 2020-03-21 DIAGNOSIS — L97519 Non-pressure chronic ulcer of other part of right foot with unspecified severity: Secondary | ICD-10-CM | POA: Diagnosis not present

## 2020-03-21 DIAGNOSIS — Z419 Encounter for procedure for purposes other than remedying health state, unspecified: Secondary | ICD-10-CM

## 2020-03-21 DIAGNOSIS — L97512 Non-pressure chronic ulcer of other part of right foot with fat layer exposed: Secondary | ICD-10-CM | POA: Diagnosis not present

## 2020-03-21 DIAGNOSIS — E1139 Type 2 diabetes mellitus with other diabetic ophthalmic complication: Secondary | ICD-10-CM | POA: Diagnosis not present

## 2020-03-21 DIAGNOSIS — I25119 Atherosclerotic heart disease of native coronary artery with unspecified angina pectoris: Secondary | ICD-10-CM | POA: Insufficient documentation

## 2020-03-21 DIAGNOSIS — E10621 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: Secondary | ICD-10-CM | POA: Diagnosis not present

## 2020-03-21 DIAGNOSIS — M778 Other enthesopathies, not elsewhere classified: Secondary | ICD-10-CM | POA: Diagnosis not present

## 2020-03-21 DIAGNOSIS — E1142 Type 2 diabetes mellitus with diabetic polyneuropathy: Secondary | ICD-10-CM | POA: Diagnosis not present

## 2020-03-21 DIAGNOSIS — M7989 Other specified soft tissue disorders: Secondary | ICD-10-CM | POA: Diagnosis not present

## 2020-03-21 DIAGNOSIS — H42 Glaucoma in diseases classified elsewhere: Secondary | ICD-10-CM | POA: Diagnosis not present

## 2020-03-21 DIAGNOSIS — E785 Hyperlipidemia, unspecified: Secondary | ICD-10-CM | POA: Diagnosis not present

## 2020-03-21 DIAGNOSIS — E11621 Type 2 diabetes mellitus with foot ulcer: Secondary | ICD-10-CM | POA: Diagnosis not present

## 2020-03-21 DIAGNOSIS — L97419 Non-pressure chronic ulcer of right heel and midfoot with unspecified severity: Secondary | ICD-10-CM | POA: Insufficient documentation

## 2020-03-21 HISTORY — DX: Headache, unspecified: R51.9

## 2020-03-21 HISTORY — DX: Allergy, unspecified, initial encounter: T78.40XA

## 2020-03-21 HISTORY — DX: Bronchitis, not specified as acute or chronic: J40

## 2020-03-21 HISTORY — PX: METATARSAL HEAD EXCISION: SHX5027

## 2020-03-21 HISTORY — DX: Presence of dental prosthetic device (complete) (partial): Z97.2

## 2020-03-21 HISTORY — DX: Presence of spectacles and contact lenses: Z97.3

## 2020-03-21 LAB — GLUCOSE, CAPILLARY
Glucose-Capillary: 141 mg/dL — ABNORMAL HIGH (ref 70–99)
Glucose-Capillary: 201 mg/dL — ABNORMAL HIGH (ref 70–99)
Glucose-Capillary: 242 mg/dL — ABNORMAL HIGH (ref 70–99)

## 2020-03-21 SURGERY — EXCISION, METATARSAL BONE, HEAD
Anesthesia: General | Site: Toe | Laterality: Right

## 2020-03-21 MED ORDER — HEMOSTATIC AGENTS (NO CHARGE) OPTIME
TOPICAL | Status: DC | PRN
  Administered 2020-03-21: 1 via TOPICAL

## 2020-03-21 MED ORDER — 0.9 % SODIUM CHLORIDE (POUR BTL) OPTIME
TOPICAL | Status: DC | PRN
  Administered 2020-03-21: 1000 mL

## 2020-03-21 MED ORDER — LIDOCAINE HCL (PF) 1 % IJ SOLN
INTRAMUSCULAR | Status: AC
  Filled 2020-03-21: qty 30

## 2020-03-21 MED ORDER — DEXTROSE 5 % IV SOLN
INTRAVENOUS | Status: DC | PRN
  Administered 2020-03-21: 3 g via INTRAVENOUS

## 2020-03-21 MED ORDER — LIDOCAINE 2% (20 MG/ML) 5 ML SYRINGE
INTRAMUSCULAR | Status: DC | PRN
  Administered 2020-03-21: 100 mg via INTRAVENOUS

## 2020-03-21 MED ORDER — LACTATED RINGERS IV SOLN: INTRAVENOUS | Status: DC | PRN

## 2020-03-21 MED ORDER — LIDOCAINE-EPINEPHRINE 1 %-1:100000 IJ SOLN
INTRAMUSCULAR | Status: AC
  Filled 2020-03-21: qty 1

## 2020-03-21 MED ORDER — FENTANYL CITRATE (PF) 100 MCG/2ML IJ SOLN: 25.0000 ug | INTRAMUSCULAR | Status: DC | PRN

## 2020-03-21 MED ORDER — PHENYLEPHRINE 40 MCG/ML (10ML) SYRINGE FOR IV PUSH (FOR BLOOD PRESSURE SUPPORT)
PREFILLED_SYRINGE | INTRAVENOUS | Status: AC
  Filled 2020-03-21: qty 10

## 2020-03-21 MED ORDER — BUPIVACAINE HCL (PF) 0.5 % IJ SOLN
INTRAMUSCULAR | Status: DC | PRN
  Administered 2020-03-21: 10 mL

## 2020-03-21 MED ORDER — MIDAZOLAM HCL 2 MG/2ML IJ SOLN
INTRAMUSCULAR | Status: AC
  Filled 2020-03-21: qty 2

## 2020-03-21 MED ORDER — FENTANYL CITRATE (PF) 250 MCG/5ML IJ SOLN
INTRAMUSCULAR | Status: AC
  Filled 2020-03-21: qty 5

## 2020-03-21 MED ORDER — CEFAZOLIN SODIUM-DEXTROSE 2-4 GM/100ML-% IV SOLN
INTRAVENOUS | Status: AC
  Filled 2020-03-21: qty 100

## 2020-03-21 MED ORDER — PROPOFOL 10 MG/ML IV BOLUS
INTRAVENOUS | Status: DC | PRN
  Administered 2020-03-21: 60 mg via INTRAVENOUS
  Administered 2020-03-21: 140 mg via INTRAVENOUS

## 2020-03-21 MED ORDER — ONDANSETRON HCL 4 MG/2ML IJ SOLN
INTRAMUSCULAR | Status: DC | PRN
  Administered 2020-03-21: 4 mg via INTRAVENOUS

## 2020-03-21 MED ORDER — DEXAMETHASONE SODIUM PHOSPHATE 10 MG/ML IJ SOLN
INTRAMUSCULAR | Status: DC | PRN
  Administered 2020-03-21: 4 mg via INTRAVENOUS

## 2020-03-21 MED ORDER — OXYCODONE-ACETAMINOPHEN 5-325 MG PO TABS: 1.0000 | ORAL_TABLET | Freq: Four times a day (QID) | ORAL | 0 refills | Status: DC | PRN

## 2020-03-21 MED ORDER — CEPHALEXIN 500 MG PO CAPS: 500.0000 mg | ORAL_CAPSULE | Freq: Three times a day (TID) | ORAL | 0 refills | Status: AC

## 2020-03-21 MED ORDER — CEFAZOLIN SODIUM-DEXTROSE 1-4 GM/50ML-% IV SOLN
INTRAVENOUS | Status: AC
  Filled 2020-03-21: qty 50

## 2020-03-21 MED ORDER — ONDANSETRON HCL 4 MG/2ML IJ SOLN
INTRAMUSCULAR | Status: AC
  Filled 2020-03-21: qty 2

## 2020-03-21 MED ORDER — LIDOCAINE-EPINEPHRINE 2 %-1:100000 IJ SOLN
INTRAMUSCULAR | Status: AC
  Filled 2020-03-21: qty 1

## 2020-03-21 MED ORDER — PHENYLEPHRINE 40 MCG/ML (10ML) SYRINGE FOR IV PUSH (FOR BLOOD PRESSURE SUPPORT)
PREFILLED_SYRINGE | INTRAVENOUS | Status: DC | PRN
  Administered 2020-03-21: 80 ug via INTRAVENOUS
  Administered 2020-03-21: 120 ug via INTRAVENOUS
  Administered 2020-03-21: 80 ug via INTRAVENOUS

## 2020-03-21 MED ORDER — PROPOFOL 10 MG/ML IV BOLUS
INTRAVENOUS | Status: AC
  Filled 2020-03-21: qty 20

## 2020-03-21 MED ORDER — BUPIVACAINE HCL (PF) 0.5 % IJ SOLN
INTRAMUSCULAR | Status: AC
  Filled 2020-03-21: qty 30

## 2020-03-21 MED ORDER — SUCCINYLCHOLINE CHLORIDE 200 MG/10ML IV SOSY
PREFILLED_SYRINGE | INTRAVENOUS | Status: DC | PRN
  Administered 2020-03-21: 130 mg via INTRAVENOUS

## 2020-03-21 MED ORDER — DEXAMETHASONE SODIUM PHOSPHATE 10 MG/ML IJ SOLN
INTRAMUSCULAR | Status: AC
  Filled 2020-03-21: qty 1

## 2020-03-21 MED ORDER — MIDAZOLAM HCL 5 MG/5ML IJ SOLN
INTRAMUSCULAR | Status: DC | PRN
  Administered 2020-03-21: 2 mg via INTRAVENOUS

## 2020-03-21 SURGICAL SUPPLY — 48 items
BENZOIN TINCTURE PRP APPL 2/3 (GAUZE/BANDAGES/DRESSINGS) ×2 IMPLANT
BLADE AVERAGE 25X9 (BLADE) ×1 IMPLANT
BLADE OSCILLATING /SAGITTAL (BLADE) ×1 IMPLANT
BLADE SURG 15 STRL LF DISP TIS (BLADE) IMPLANT
BLADE SURG 15 STRL SS (BLADE) ×2
BNDG ELASTIC 4X5.8 VLCR STR LF (GAUZE/BANDAGES/DRESSINGS) ×1 IMPLANT
BNDG ESMARK 4X9 LF (GAUZE/BANDAGES/DRESSINGS) ×1 IMPLANT
BNDG GAUZE ELAST 4 BULKY (GAUZE/BANDAGES/DRESSINGS) ×2 IMPLANT
CHLORAPREP W/TINT 26 (MISCELLANEOUS) ×1 IMPLANT
COVER SURGICAL LIGHT HANDLE (MISCELLANEOUS) ×1 IMPLANT
COVER WAND RF STERILE (DRAPES) ×1 IMPLANT
CUFF TOURN SGL QUICK 18X4 (TOURNIQUET CUFF) ×2 IMPLANT
CUFF TOURN SGL QUICK 24 (TOURNIQUET CUFF)
CUFF TRNQT CYL 24X4X16.5-23 (TOURNIQUET CUFF) IMPLANT
DRSG PAD ABDOMINAL 8X10 ST (GAUZE/BANDAGES/DRESSINGS) ×2 IMPLANT
DRSG XEROFORM 1X8 (GAUZE/BANDAGES/DRESSINGS) ×1 IMPLANT
ELECT REM PT RETURN 9FT ADLT (ELECTROSURGICAL)
ELECTRODE REM PT RTRN 9FT ADLT (ELECTROSURGICAL) IMPLANT
GAUZE PACKING IODOFORM 1/4X15 (PACKING) ×1 IMPLANT
GAUZE SPONGE 4X4 12PLY STRL (GAUZE/BANDAGES/DRESSINGS) ×2 IMPLANT
GAUZE XEROFORM 1X8 LF (GAUZE/BANDAGES/DRESSINGS) ×2 IMPLANT
GLOVE BIO SURGEON STRL SZ8 (GLOVE) ×2 IMPLANT
GLOVE BIOGEL PI IND STRL 8 (GLOVE) ×1 IMPLANT
GLOVE BIOGEL PI INDICATOR 8 (GLOVE) ×1
GOWN STRL REUS W/ TWL LRG LVL3 (GOWN DISPOSABLE) ×2 IMPLANT
GOWN STRL REUS W/TWL LRG LVL3 (GOWN DISPOSABLE) ×2
HEMOSTAT SURGICEL 2X14 (HEMOSTASIS) ×1 IMPLANT
KIT BASIN OR (CUSTOM PROCEDURE TRAY) ×2 IMPLANT
KIT TURNOVER KIT B (KITS) ×2 IMPLANT
NDL PRECISIONGLIDE 27X1.5 (NEEDLE) ×1 IMPLANT
NEEDLE PRECISIONGLIDE 27X1.5 (NEEDLE) ×2 IMPLANT
NS IRRIG 1000ML POUR BTL (IV SOLUTION) ×2 IMPLANT
PACK ORTHO EXTREMITY (CUSTOM PROCEDURE TRAY) ×2 IMPLANT
PAD ABD 8X10 STRL (GAUZE/BANDAGES/DRESSINGS) ×1 IMPLANT
PAD ARMBOARD 7.5X6 YLW CONV (MISCELLANEOUS) ×4 IMPLANT
PAD CAST 4YDX4 CTTN HI CHSV (CAST SUPPLIES) IMPLANT
PADDING CAST COTTON 4X4 STRL (CAST SUPPLIES)
SOL PREP POV-IOD 4OZ 10% (MISCELLANEOUS) ×2 IMPLANT
STAPLER VISISTAT (STAPLE) ×1 IMPLANT
STAPLER VISISTAT 35W (STAPLE) IMPLANT
STRIP CLOSURE SKIN 1/2X4 (GAUZE/BANDAGES/DRESSINGS) ×1 IMPLANT
SUT PROLENE 4 0 PS 2 18 (SUTURE) IMPLANT
SUT VIC AB 3-0 PS2 18 (SUTURE) IMPLANT
SUT VICRYL 4-0 PS2 18IN ABS (SUTURE) ×2 IMPLANT
SYR CONTROL 10ML LL (SYRINGE) ×3 IMPLANT
TOWEL GREEN STERILE (TOWEL DISPOSABLE) ×2 IMPLANT
TUBE CONNECTING 12X1/4 (SUCTIONS) ×2 IMPLANT
YANKAUER SUCT BULB TIP NO VENT (SUCTIONS) ×1 IMPLANT

## 2020-03-21 NOTE — Anesthesia Procedure Notes (Signed)
Procedure Name: Intubation Date/Time: 03/21/2020 12:50 PM Performed by: Moshe Salisbury, CRNA Pre-anesthesia Checklist: Patient identified, Emergency Drugs available, Suction available and Patient being monitored Patient Re-evaluated:Patient Re-evaluated prior to induction Oxygen Delivery Method: Circle System Utilized Preoxygenation: Pre-oxygenation with 100% oxygen Induction Type: IV induction Ventilation: Mask ventilation without difficulty Laryngoscope Size: Mac and 4 Grade View: Grade II Tube type: Oral Tube size: 8.0 mm Number of attempts: 1 Airway Equipment and Method: Stylet and Bite block Placement Confirmation: ETT inserted through vocal cords under direct vision,  positive ETCO2 and breath sounds checked- equal and bilateral Secured at: 22 cm Tube secured with: Tape Dental Injury: Teeth and Oropharynx as per pre-operative assessment

## 2020-03-21 NOTE — Interval H&P Note (Signed)
History and Physical Interval Note:  03/21/2020 12:16 PM  Lawrence [name]  has presented today for surgery, with the diagnosis of ULCER RIGHT FOOT DIABETES.  The various methods of treatment have been discussed with the patient and family. After consideration of risks, benefits and other options for treatment, the patient has consented to  Procedure(s): METATARSAL HEAD RESECTION RIGHT AND DEBRIDEMENT OF ULCER (Right) as a surgical intervention.  The patient's history has been reviewed, patient examined, no change in status, stable for surgery.  I have reviewed the patient's chart and labs.  Questions were answered to the patient's satisfaction.     [name]

## 2020-03-21 NOTE — Transfer of Care (Signed)
Immediate Anesthesia Transfer of Care Note  Patient: Lawrence [name]  Procedure(s) Performed: METATARSAL HEAD RESECTION RIGHT AND DEBRIDEMENT OF ULCER (Right Toe)  Patient Location: PACU  Anesthesia Type:General  Level of Consciousness: drowsy and patient cooperative  Airway & Oxygen Therapy: Patient Spontanous Breathing and Patient connected to nasal cannula oxygen  Post-op Assessment: Report given to RN, Post -op Vital signs reviewed and stable and Patient moving all extremities  Post vital signs: Reviewed and stable  Last Vitals:  Vitals Value Taken Time  BP 119/54 03/21/20 1335  Temp 36.4 C 03/21/20 1335  Pulse 63 03/21/20 1336  Resp 15 03/21/20 1336  SpO2 100 % 03/21/20 1336  Vitals shown include unvalidated device data.  Last Pain:  Vitals:   03/21/20 1008  TempSrc:   PainSc: 0-No pain      Patients Stated Pain Goal: 3 ([id number])  Complications: No apparent anesthesia complications

## 2020-03-21 NOTE — Brief Op Note (Signed)
03/21/2020  1:37 PM  PATIENT:  Lawrence [name]  69 y.o. male  PRE-OPERATIVE DIAGNOSIS:  ULCER RIGHT FOOT DIABETES  POST-OPERATIVE DIAGNOSIS:  ULCER RIGHT FOOT DIABETES  PROCEDURE:  Procedure(s): METATARSAL HEAD RESECTION RIGHT AND DEBRIDEMENT OF ULCER (Right)  SURGEON:  Surgeon(s) and Role:    [name], DPM - Primary  PHYSICIAN ASSISTANT:   ASSISTANTS: none   ANESTHESIA:   general  EBL:  15 mL   BLOOD ADMINISTERED:none  DRAINS: none   LOCAL MEDICATIONS USED:  Marcaine  SPECIMEN:  Source of Specimen:  Culture wound  DISPOSITION OF SPECIMEN:  PATHOLOGY  COUNTS:  YES  TOURNIQUET:   Total Tourniquet Time Documented: Calf (Right) - 20 minutes Total: Calf (Right) - 20 minutes   DICTATION: .Dragon Dictation  PLAN OF CARE: Discharge to home after PACU  PATIENT DISPOSITION:  PACU - hemodynamically stable.   Delay start of Pharmacological VTE agent (>24hrs) due to surgical blood loss or risk of bleeding: not applicable

## 2020-03-22 ENCOUNTER — Encounter: Payer: Self-pay | Admitting: *Deleted

## 2020-03-23 ENCOUNTER — Encounter: Payer: Medicare Other | Admitting: Podiatry

## 2020-03-23 NOTE — Anesthesia Postprocedure Evaluation (Signed)
Anesthesia Post Note  Patient: Lawrence [name]  Procedure(s) Performed: METATARSAL HEAD RESECTION RIGHT AND DEBRIDEMENT OF ULCER (Right Toe)     Anesthesia Post Evaluation  Last Vitals:  Vitals:   03/21/20 1445 03/21/20 1500  BP: (!) 122/56 (!) 126/58  Pulse: 63 65  Resp: 15 15  Temp:  36.8 C  SpO2: 97% 98%    Last Pain:  Vitals:   03/21/20 1500  TempSrc:   PainSc: 0-No pain                 Akacia Boltz

## 2020-03-24 NOTE — Op Note (Signed)
OPERATIVE REPORT Patient name: Lawrence [name] MRN: [id number] DOB: 27-Jun-1951  DOS: 03/21/2020  Preop Dx: DFU subfifth MTPJ right foot Postop Dx: same  Procedure:  1.  Fifth metatarsal head resection right foot 2.  Excisional debridement of ulcer right foot   Surgeon: [name] DPM  Anesthesia: General anesthesia  Hemostasis: Ankle tourniquet inflated to a pressure of 272mmHg after esmarch exsanguination   EBL: Minimal mL Materials: None Injectables: 10 mL of 0.5% Marcaine plain Pathology: Deep wound culture sent to pathology for aerobic anaerobic and Gram stain  Condition: The patient tolerated the procedure and anesthesia well. No complications noted or reported   Justification for procedure: The patient is a 69 y.o. 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 All conservative modalities of been unsuccessful in providing any sort of satisfactory alleviation of symptoms with the patient. The patient was told benefits as well as possible side effects of the surgery. The patient consented for surgical correction. The patient consent form was reviewed. All patient questions were answered. No guarantees were expressed or implied. The patient and the surgeon boson the patient consent form with the witness present and placed in the patient's chart.   Procedure in Detail: The patient was brought to the operating room, placed in the operating table in the supine position at which time an aseptic scrub and drape were performed about the patient's respective lower extremity after anesthesia was induced as described above. Attention was then directed to the surgical area where procedure number one commenced.  Procedure #1: Fifth metatarsal head resection right foot A 4 cm linear longitudinal skin incision was planned and made overlying the fifth MTPJ of the right foot.  Incision  was carried down to the level of bone with care taken to cut clamp ligate and retract away all small neurovascular structures traversing the incision site.  Identification of the EDL tendon was achieved and a transverse tenotomy was performed to release the dorsal pole of the EDL tendon.  Sharp soft tissue dissection was utilized to release the capsular tissue from the fifth MTPJ to expose the hypertrophic, plantarflexed head of the metatarsal.  A sagittal saw was then utilized to create an osteotomy at the neck of the metatarsal.  The head of the metatarsal was removed in toto.  Irrigation was utilized and primary closure was achieved using 4-0 Vicryl suture and skin staples.  Procedure #2: Excisional debridement ulcer right foot Medically necessary excisional debridement including muscle and deep fascial tissue was performed using a surgical #15 blade.  Excisional debridement of all the necrotic nonviable tissue down to healthy bleeding viable tissue was performed with post debridement measurement same as pre-.  The wound measures approximately 1.0 x 1.0 x 0.3 cm.  Wound base is granular with a minimal amount of fibrotic tissue noted.  Dry sterile compressive dressings were then applied to all previously mentioned incision sites about the patient's lower extremity. The tourniquet which was used for hemostasis was deflated. All normal neurovascular responses including pink color and warmth returned all the digits of patient's lower extremity.  The patient was then transferred from the operating room to the recovery room having tolerated the procedure and anesthesia well. All vital signs are stable. After a brief stay in the recovery room the patient was discharged with adequate prescriptions for analgesia. Verbal as well as written instructions were provided for  the patient regarding wound care. The patient is to keep the dressings clean dry and intact until they are to follow surgeon Dr. Daylene Katayama in the  office upon discharge.   [name], DPM [facility]  Dr. [name], [address]                                        [place], [place] [address]                Office [phone]  Fax [phone]

## 2020-03-26 LAB — AEROBIC/ANAEROBIC CULTURE W GRAM STAIN (SURGICAL/DEEP WOUND): Culture: NO GROWTH

## 2020-03-27 ENCOUNTER — Other Ambulatory Visit: Payer: Self-pay

## 2020-03-27 ENCOUNTER — Encounter: Payer: Self-pay | Admitting: Podiatry

## 2020-03-27 ENCOUNTER — Ambulatory Visit (INDEPENDENT_AMBULATORY_CARE_PROVIDER_SITE_OTHER): Payer: Medicare Other

## 2020-03-27 ENCOUNTER — Ambulatory Visit (INDEPENDENT_AMBULATORY_CARE_PROVIDER_SITE_OTHER): Payer: Medicare Other | Admitting: Podiatry

## 2020-03-27 DIAGNOSIS — L97512 Non-pressure chronic ulcer of other part of right foot with fat layer exposed: Secondary | ICD-10-CM | POA: Diagnosis not present

## 2020-03-27 DIAGNOSIS — Z9889 Other specified postprocedural states: Secondary | ICD-10-CM

## 2020-03-29 DIAGNOSIS — I872 Venous insufficiency (chronic) (peripheral): Secondary | ICD-10-CM | POA: Diagnosis not present

## 2020-03-29 NOTE — Progress Notes (Signed)
   Subjective:  Patient presents today status post 5th metatarsal head resection right. DOS: 03/15/2020. He states he is doing well with minimal pain. He has been taking the pain medication at night only. He has been using the CAM boot and taking the antibiotic as directed. Patient is here for further evaluation and treatment.    Past Medical History:  Diagnosis Date  . Allergies   . Bronchitis    PMH  . CAD (coronary artery disease)    07/2007  . Diabetes ([place])   . Elevated lipids   . Glaucoma   . HBP (high blood pressure)   . Headache   . Myocardial infarction ([place])   . Neuropathy   . Osteoarthritis   . Skin cancer   . Sleep apnea    CPAP  . Wears glasses   . Wears partial dentures       Objective/Physical Exam Neurovascular status intact.  Skin incisions appear to be well coapted with sutures and staples intact. No sign of infectious process noted. No dehiscence. No active bleeding noted. Moderate edema noted to the surgical extremity.  Radiographic Exam:  Osteotomies sites appear to be stable with routine healing.  Assessment: 1. s/p 5th metatarsal head resection right. DOS: 03/15/2020   Plan of Care:  1. Patient was evaluated. X-rays reviewed 2. Dressing changed.  3. Finish oral antibiotic as prescribed.  4. Continue weightbearing in CAM boot.  5. Return to clinic in one week.    [name], DPM [facility]  Dr. [name], [address]                                        [place], [place] [address]                Office [phone]  Fax [phone]

## 2020-03-30 ENCOUNTER — Encounter: Payer: Medicare Other | Admitting: Podiatry

## 2020-04-03 ENCOUNTER — Encounter: Payer: Self-pay | Admitting: Podiatry

## 2020-04-03 ENCOUNTER — Other Ambulatory Visit: Payer: Self-pay

## 2020-04-03 ENCOUNTER — Ambulatory Visit (INDEPENDENT_AMBULATORY_CARE_PROVIDER_SITE_OTHER): Payer: Medicare Other | Admitting: Podiatry

## 2020-04-03 ENCOUNTER — Encounter: Payer: Medicare Other | Admitting: Podiatry

## 2020-04-03 DIAGNOSIS — Z9889 Other specified postprocedural states: Secondary | ICD-10-CM | POA: Diagnosis not present

## 2020-04-03 DIAGNOSIS — L97512 Non-pressure chronic ulcer of other part of right foot with fat layer exposed: Secondary | ICD-10-CM

## 2020-04-03 DIAGNOSIS — E0842 Diabetes mellitus due to underlying condition with diabetic polyneuropathy: Secondary | ICD-10-CM | POA: Diagnosis not present

## 2020-04-03 NOTE — Progress Notes (Signed)
   Subjective:  Patient presents today status post 5th metatarsal head resection right. DOS: 03/15/2020. He states he is doing well with minimal pain. He has been taking the pain medication at night only. He has been using the CAM boot and taking the antibiotic as directed. Patient is here for further evaluation and treatment.    Past Medical History:  Diagnosis Date  . Allergies   . Bronchitis    PMH  . CAD (coronary artery disease)    07/2007  . Diabetes ([place])   . Elevated lipids   . Glaucoma   . HBP (high blood pressure)   . Headache   . Myocardial infarction ([place])   . Neuropathy   . Osteoarthritis   . Skin cancer   . Sleep apnea    CPAP  . Wears glasses   . Wears partial dentures       Objective/Physical Exam Neurovascular status intact.  Skin incisions appear to be well coapted with sutures and staples intact. No sign of infectious process noted. No dehiscence. No active bleeding noted. Moderate edema noted to the surgical extremity. Wound noted to the plantar aspect of the fifth MTPJ right foot measuring approximately 1.0 x 1.0 x 0.2 cm.  To the noted ulceration there is no eschar.  There is a moderate amount of slough fibrin and necrotic tissue noted.  Wound base and granulation tissue is red.  There is no exposed bone muscle tendon ligament or joint.  No malodor noted.  Periwound integrity is intact.  Assessment: 1. s/p 5th metatarsal head resection right. DOS: 03/15/2020 2.  Ulcer right fifth sub-MPJ secondary to diabetes mellitus   Plan of Care:  1. Patient was evaluated. X-rays reviewed 2. Dressing changed.  Recommend daily dressing changes at home 3.  Patient recently finished the oral doxycycline last weekend 4. Continue weightbearing in CAM boot.  5. Return to clinic in 2 weeks   [name], DPM [facility]  Dr. [name], [address]                                        [place], [place] [address]                Office  [phone]  Fax [phone]

## 2020-04-09 DIAGNOSIS — H353221 Exudative age-related macular degeneration, left eye, with active choroidal neovascularization: Secondary | ICD-10-CM | POA: Diagnosis not present

## 2020-04-13 ENCOUNTER — Encounter: Payer: Medicare Other | Admitting: Podiatry

## 2020-04-27 ENCOUNTER — Encounter: Payer: Self-pay | Admitting: Podiatry

## 2020-04-27 ENCOUNTER — Ambulatory Visit (INDEPENDENT_AMBULATORY_CARE_PROVIDER_SITE_OTHER): Payer: Medicare Other | Admitting: Podiatry

## 2020-04-27 ENCOUNTER — Other Ambulatory Visit: Payer: Self-pay

## 2020-04-27 ENCOUNTER — Ambulatory Visit (INDEPENDENT_AMBULATORY_CARE_PROVIDER_SITE_OTHER): Payer: Medicare Other

## 2020-04-27 DIAGNOSIS — L97512 Non-pressure chronic ulcer of other part of right foot with fat layer exposed: Secondary | ICD-10-CM

## 2020-04-27 DIAGNOSIS — M79676 Pain in unspecified toe(s): Secondary | ICD-10-CM | POA: Diagnosis not present

## 2020-04-27 DIAGNOSIS — B351 Tinea unguium: Secondary | ICD-10-CM

## 2020-04-27 DIAGNOSIS — Z9889 Other specified postprocedural states: Secondary | ICD-10-CM

## 2020-04-27 DIAGNOSIS — E0842 Diabetes mellitus due to underlying condition with diabetic polyneuropathy: Secondary | ICD-10-CM | POA: Diagnosis not present

## 2020-04-27 NOTE — Progress Notes (Signed)
   Subjective:  Patient presents today status post 5th metatarsal head resection right. DOS: 03/15/2020.  Patient states that the wound to the plantar aspect of the foot has now completely healed.  He is doing very well regarding the surgery.  He has been wearing the postsurgical cam boot as directed.  Patient also complains today of elongated thickened toenails that are growing upwards and they are sharp and they get caught on his socks.  Painful with shoe gear.  He is unable to trim his own nails.  Past Medical History:  Diagnosis Date  . Allergies   . Bronchitis    PMH  . CAD (coronary artery disease)    07/2007  . Diabetes ([place])   . Elevated lipids   . Glaucoma   . HBP (high blood pressure)   . Headache   . Myocardial infarction ([place])   . Neuropathy   . Osteoarthritis   . Skin cancer   . Sleep apnea    CPAP  . Wears glasses   . Wears partial dentures       Objective/Physical Exam General: The patient is alert and oriented x3 in no acute distress.  Dermatology: Skin is cool, dry and supple bilateral lower extremities. Negative for open lesions or macerations.  Hyperkeratotic dystrophic nails noted 1-5 bilateral.  The surgical skin incision to the right foot is healed completely.  Complete reepithelialization has occurred.  The wound to the plantar aspect of the fifth MTPJ is also healed.  Vascular: Palpable pedal pulses bilaterally. No edema or erythema noted. Capillary refill within normal limits.  Neurological: Epicritic and protective threshold absent bilaterally.   Musculoskeletal Exam: All pedal and ankle joints range of motion within normal limits bilateral. Muscle strength 5/5 in all groups bilateral.     Assessment: 1. s/p 5th metatarsal head resection right. DOS: 03/15/2020 2.  Ulcer right fifth sub-MPJ secondary to diabetes mellitus-healed 3.  Diabetes mellitus with peripheral polyneuropathy 4.  Pain due to onychomycosis of toenail bilateral 1-5   Plan of  Care:  1. Patient was evaluated.  2.  Mechanical debridement of nails 1-5 bilateral was performed using a nail nipper without incident or bleeding 3.  Patient may discontinue cam boot.  Recommend good supportive diabetic shoes 4.  Resume full activity no restrictions. 5.  Return to clinic in 3 months for routine foot care  [name], DPM [facility]  Dr. [name], [address]                                        [place], [place] [address]                Office [phone]  Fax [phone]

## 2020-05-30 ENCOUNTER — Other Ambulatory Visit: Payer: Self-pay | Admitting: Podiatry

## 2020-06-15 DIAGNOSIS — E782 Mixed hyperlipidemia: Secondary | ICD-10-CM | POA: Diagnosis not present

## 2020-07-03 DIAGNOSIS — H353221 Exudative age-related macular degeneration, left eye, with active choroidal neovascularization: Secondary | ICD-10-CM | POA: Diagnosis not present

## 2020-07-06 ENCOUNTER — Telehealth: Payer: Self-pay

## 2020-07-06 DIAGNOSIS — I200 Unstable angina: Secondary | ICD-10-CM

## 2020-07-06 NOTE — Telephone Encounter (Signed)
Nitro refill request.  Patient last seen 03/08/2020 last filled 08/2018.  Left message for patient to call back to confirm pharmacy.

## 2020-07-06 NOTE — Telephone Encounter (Signed)
Patient came into office requesting a refill on medication    nitroGLYCERIN (NITROSTAT) 0.4 MG SL tablet  Goes through [facility] for [facility]

## 2020-07-11 ENCOUNTER — Ambulatory Visit (INDEPENDENT_AMBULATORY_CARE_PROVIDER_SITE_OTHER): Payer: Medicare Other | Admitting: Primary Care

## 2020-07-11 ENCOUNTER — Encounter: Payer: Self-pay | Admitting: Primary Care

## 2020-07-11 ENCOUNTER — Other Ambulatory Visit: Payer: Self-pay

## 2020-07-11 ENCOUNTER — Telehealth: Payer: Self-pay | Admitting: Primary Care

## 2020-07-11 VITALS — BP 118/64 | HR 64 | Ht 70.5 in | Wt 303.0 lb

## 2020-07-11 DIAGNOSIS — I208 Other forms of angina pectoris: Secondary | ICD-10-CM

## 2020-07-11 DIAGNOSIS — L0591 Pilonidal cyst without abscess: Secondary | ICD-10-CM | POA: Diagnosis not present

## 2020-07-11 DIAGNOSIS — Z23 Encounter for immunization: Secondary | ICD-10-CM

## 2020-07-11 DIAGNOSIS — I252 Old myocardial infarction: Secondary | ICD-10-CM | POA: Insufficient documentation

## 2020-07-11 DIAGNOSIS — I200 Unstable angina: Secondary | ICD-10-CM

## 2020-07-11 HISTORY — DX: Pilonidal cyst without abscess: L05.91

## 2020-07-11 MED ORDER — NITROGLYCERIN 0.4 MG SL SUBL: SUBLINGUAL_TABLET | SUBLINGUAL | 0 refills | Status: AC

## 2020-07-11 NOTE — Assessment & Plan Note (Addendum)
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     Encouraged patient to follow up with dermatology has appointment on October 13 at [place].    Approve assessment and plan. Pleas Koch, NP

## 2020-07-11 NOTE — Telephone Encounter (Signed)
Updated at appointment.

## 2020-07-11 NOTE — Progress Notes (Signed)
Subjective:    Patient ID: Lawrence [name], male    DOB: 30-Jan-1951, 69 y.o.   MRN: [id number]  HPI  This visit occurred during the SARS-CoV-2 public health emergency.  Safety protocols were in place, including screening questions prior to the visit, additional usage of staff PPE, and extensive cleaning of exam room while observing appropriate contact time as indicated for disinfecting solutions.   [name] is a 69 year old male with a significant medical history including type 1 diabetes, CAD, OSA, morbid obesity, hyperlipidemia who presents today with a chief complaint of skin sore.  His sore is located to the upper gluteal cleft which he first noticed 4-5 months ago. He was evaluated by a dermatologist through the [facility] clinic, was provided with a spray (for which he is unsure of the name). Since then he's noticed little improvement. The spot is mostly purple, will sometimes drain a serosanguinous color to his underwear at night. His wife attempted to pop the site with a needle, but was unable to express puss. The sore is painful at times, sometimes increases in size. He is sedentary most of his day.     Review of Systems  Constitutional: Negative for fever.  Skin: Positive for color change and wound.       Past Medical History:  Diagnosis Date   Allergies    Bronchitis    PMH   CAD (coronary artery disease)    07/2007   Diabetes (HCC)    Elevated lipids    Glaucoma    HBP (high blood pressure)    Headache    Myocardial infarction (HCC)    Neuropathy    Osteoarthritis    Skin cancer    Sleep apnea    CPAP   Wears glasses    Wears partial dentures      Social History   Socioeconomic History   Marital status: Married    Spouse name: Not on file   Number of children: Not on file   Years of education: Not on file   Highest education level: Not on file  Occupational History   Not on file  Tobacco Use   Smoking status: Former Smoker     Packs/day: 1.00    Years: 16.00    Pack years: 16.00    Types: Cigarettes    Start date: 11/03/1969    Quit date: 07/22/1986    Years since quitting: 33.9   Smokeless tobacco: Never Used  Vaping Use   Vaping Use: Never used  Substance and Sexual Activity   Alcohol use: Not Currently    Alcohol/week: 0.0 standard drinks   Drug use: No   Sexual activity: Never  Other Topics Concern   Not on file  Social History Narrative   Married.  Lives at home with wife.    Social Determinants of Health   Financial Resource Strain:    Difficulty of Paying Living Expenses: Not on file  Food Insecurity:    Worried About Charity fundraiser in the Last Year: Not on file   [employer] of Food in the Last Year: Not on file  Transportation Needs:    Lack of Transportation (Medical): Not on file   Lack of Transportation (Non-Medical): Not on file  Physical Activity:    Days of Exercise per Week: Not on file   Minutes of Exercise per Session: Not on file  Stress:    Feeling of Stress : Not on file  Social Connections:  Frequency of Communication with Friends and Family: Not on file   Frequency of Social Gatherings with Friends and Family: Not on file   Attends Religious Services: Not on file   Active Member of Clubs or Organizations: Not on file   Attends Archivist Meetings: Not on file   Marital Status: Not on file  Intimate Partner Violence:    Fear of Current or Ex-Partner: Not on file   Emotionally Abused: Not on file   Physically Abused: Not on file   Sexually Abused: Not on file    Past Surgical History:  Procedure Laterality Date   APPENDECTOMY     CARDIAC CATHETERIZATION N/A 06/11/2016   Procedure: Left Heart Cath and Coronary Angiography;  Surgeon: Corey Skains, MD;  Location: [place] [facility];  Service: Cardiovascular;  Laterality: N/A;   CARPAL TUNNEL RELEASE Left 04/24/2016   Procedure: CARPAL TUNNEL RELEASE;  Surgeon: Hessie Knows, MD;   Location: [facility];  Service: Orthopedics;  Laterality: Left;   CATARACT EXTRACTION W/ INTRAOCULAR LENS  IMPLANT, BILATERAL Bilateral    CATARACT EXTRACTION, BILATERAL     R eye 07/16/12, L eye 08/13/12 - with lens implant in both eyes   CORONARY ARTERY BYPASS GRAFT N/A 06/27/2016   Procedure: CORONARY ARTERY BYPASS GRAFTING times four using left internal mammary artery and right leg saphenous vein;  Surgeon: Ivin Poot, MD;  Location: [place];  Service: Open Heart Surgery;  Laterality: N/A;   EYE SURGERY Bilateral    JOINT REPLACEMENT     KNEE ARTHROPLASTY Left 02/11/2016   Procedure: COMPUTER ASSISTED TOTAL KNEE ARTHROPLASTY;  Surgeon: Dereck Leep, MD;  Location: [facility];  Service: Orthopedics;  Laterality: Left;   KNEE ARTHROSCOPY Right    LEFT HEART CATH AND CORS/GRAFTS ANGIOGRAPHY Left 01/13/2017   Procedure: Left Heart Cath and Cors/Grafts Angiography;  Surgeon: Corey Skains, MD;  Location: [place] [facility];  Service: Cardiovascular;  Laterality: Left;   METATARSAL HEAD EXCISION Right 03/21/2020   Procedure: METATARSAL HEAD RESECTION RIGHT AND DEBRIDEMENT OF ULCER;  Surgeon: [name], DPM;  Location: [place];  Service: Podiatry;  Laterality: Right;   MULTIPLE TOOTH EXTRACTIONS     TEE WITHOUT CARDIOVERSION N/A 06/27/2016   Procedure: TRANSESOPHAGEAL ECHOCARDIOGRAM (TEE);  Surgeon: Ivin Poot, MD;  Location: [place];  Service: Open Heart Surgery;  Laterality: N/A;    Family History  Problem Relation Age of Onset   Drug abuse Brother    Hypertension Mother    Lupus Mother    Hypertension Father    Diabetes Father    Heart attack Father 61       died in his 66s   Arthritis Maternal Grandmother    Arthritis Maternal Grandfather    Arthritis Paternal Grandmother    Arthritis Paternal Grandfather    Heart disease Brother     Allergies  Allergen Reactions   Nitroglycerin Nausea Only and Other (See Comments)    Patches only - headache     Statins Other (See Comments)    BODY PAIN, Pt taking Crestor*      Lipitor [Atorvastatin] Other (See Comments) and Rash    BODY PAIN   Lisinopril Other (See Comments) and Nausea Only    Light headed, dizzy Other reaction(s): Dizziness    Current Outpatient Medications on File Prior to Visit  Medication Sig Dispense Refill   aspirin 81 MG EC tablet Take 81 mg by mouth daily. Swallow whole.  aspirin-acetaminophen-caffeine (EXCEDRIN MIGRAINE) 250-250-65 MG tablet Take by mouth every 6 (six) hours as needed for headache.     Calcium Carbonate (CALCIUM 600 PO) Take 600 mg by mouth daily.     cholecalciferol (VITAMIN D) 1000 units tablet Take 2,000 Units by mouth in the morning and at bedtime.      clopidogrel (PLAVIX) 75 MG tablet Take 1 tablet (75 mg total) by mouth daily. 30 tablet 0   collagenase (SANTYL) ointment Apply 1 application topically daily. 30 g 1   famotidine (PEPCID) 20 MG tablet Take 1 tablet (20 mg total) by mouth daily. 30 tablet 0   furosemide (LASIX) 40 MG tablet Take 1 tablet (40 mg total) by mouth daily. 30 tablet 1   gentamicin cream (GARAMYCIN) 0.1 % Apply 1 application topically 3 (three) times daily. (Patient not taking: Reported on 03/12/2020) 30 g 1   glucagon (GLUCAGON EMERGENCY) 1 MG injection Inject 1 mg into the vein once as needed. Reported on 02/11/2016     GLUTOSE 15 40 % GEL      insulin aspart (NOVOLOG) 100 UNIT/ML injection Inject 20 Units into the skin 3 (three) times daily. (Patient taking differently: Inject 15-20 Units into the skin 3 (three) times daily with meals. ) 10 mL 5   insulin glargine (LANTUS) 100 UNIT/ML injection Inject 0.25 mLs (25 Units total) into the skin at bedtime. 30 mL 1   isosorbide mononitrate (IMDUR) 60 MG 24 hr tablet Take 60 mg by mouth daily.      l-methylfolate-B6-B12 (METANX) 3-35-2 MG TABS tablet TAKE 1 TABLET BY MOUTH DAILY 60 tablet 3   loratadine (CLARITIN) 10 MG tablet Take 10 mg by mouth daily.      metoprolol tartrate (LOPRESSOR) 25 MG tablet Take 25 mg by mouth 2 (two) times daily.     Multiple Vitamin (MULTIVITAMIN) capsule Take 1 capsule by mouth daily.     nitroGLYCERIN (NITROSTAT) 0.4 MG SL tablet DISSOLVE 1 TABLET UNDER THE TONGUE EVERY 5 MINUTES AS NEEDED FOR CHEST PAIN (Patient taking differently: Place 0.4 mg under the tongue every 5 (five) minutes as needed for chest pain. ) 2 tablet 6   oxyCODONE-acetaminophen (PERCOCET) 5-325 MG tablet Take 1 tablet by mouth every 6 (six) hours as needed for severe pain. 30 tablet 0   potassium chloride SA (K-DUR,KLOR-CON) 20 MEQ tablet Take 1 tablet (20 mEq total) by mouth daily. 30 tablet 1   ranolazine (RANEXA) 1000 MG SR tablet Take 1,000 mg by mouth 2 (two) times daily.     rosuvastatin (CRESTOR) 20 MG tablet Take 1 tablet (20 mg total) by mouth daily at 6 PM. 30 tablet 1   Travoprost, BAK Free, (TRAVATAN) 0.004 % SOLN ophthalmic solution Place 1 drop into both eyes at bedtime.      triamcinolone ointment (KENALOG) 0.1 % Apply 1 application topically 2 (two) times daily.     vitamin C (ASCORBIC ACID) 500 MG tablet Take 500 mg by mouth 2 (two) times daily.      No current facility-administered medications on file prior to visit.    BP 118/64    Pulse 64    Ht 5' 10.5" (1.791 m)    Wt (!) 303 lb (137.4 kg)    SpO2 98%    BMI 42.86 kg/m    Objective:   Physical Exam Skin:    General: Skin is warm and dry.     Findings: No erythema.     Comments: 1 cm oval shaped, purple  colored, slightly raised spot to top of gluteal cleft. No surrounding erythema. No drainage. Non tender.   Neurological:     Mental Status: He is alert.            Assessment & Plan:

## 2020-07-11 NOTE — Patient Instructions (Addendum)
Monitor for signs of infection such as increase in pain, swelling, redness or fever.   Follow up with dermatologist at [facility] and please send a My Chart message with name of spray you are currently using.    Pilonidal Cyst  A pilonidal cyst is a fluid-filled sac that forms beneath the skin near the tailbone, at the top of the crease of the buttocks (pilonidal area). If the cyst is not large and not infected, it may not cause any problems. If the cyst becomes irritated or infected, it may get larger and fill with pus. An infected cyst is called an abscess. A pilonidal abscess may cause pain and swelling, and it may need to be drained or removed. What are the causes? The cause of this condition is not always known. In some cases, a hair that grows into your skin (ingrown hair) may be the cause. What increases the risk? You are more likely to get a pilonidal cyst if you:  Are male.  Have lots of hair near the crease of the buttocks.  Are overweight.  Have a dimple near the crease of the buttocks.  Wear tight clothing.  Do not bathe or shower often.  Sit for long periods of time. What are the signs or symptoms? Signs and symptoms of a pilonidal cyst may include pain, swelling, redness, and warmth in the pilonidal area. Depending on how big the cyst is, you may be able to feel a lump near your tailbone. If your cyst becomes infected, symptoms may include:  Pus or fluid drainage.  Fever.  Pain, swelling, and redness getting worse.  The lump getting bigger. How is this diagnosed? This condition may be diagnosed based on:  Your symptoms and medical history.  A physical exam.  A blood test to check for infection.  Testing a pus sample, if applicable. How is this treated? If your cyst does not cause symptoms, you may not need any treatment. If your cyst bothers you or is infected, you may need a procedure to drain or remove the cyst. Depending on the size, location, and severity  of your cyst, your health care provider may:  Make an incision in the cyst and drain it (incision and drainage).  Open and drain the cyst, and then stitch the wound so that it stays open while it heals (marsupialization). You will be given instructions about how to care for your open wound while it heals.  Remove all or part of the cyst, and then close the wound (cyst removal). You may need to take antibiotic medicines before your procedure. Follow these instructions at home: Medicines  Take over-the-counter and prescription medicines only as told by your health care provider.  If you were prescribed an antibiotic medicine, take it as told by your health care provider. Do not stop taking the antibiotic even if you start to feel better. General instructions  Keep the area around your pilonidal cyst clean and dry.  If there is fluid or pus draining from your cyst: ? Cover the area with a clean bandage (dressing) as needed. ? Wash the area gently with soap and water. Pat the area dry with a clean towel. Do not rub the area because that may cause bleeding.  Remove hair from the area around the cyst only if your health care provider tells you to do this.  Do not wear tight pants or sit in one position for long periods at a time.  Keep all follow-up visits as told by  your health care provider. This is important. Contact a health care provider if you have:  New redness, swelling, or pain.  A fever.  Severe pain. Summary  A pilonidal cyst is a fluid-filled sac that forms beneath the skin near the tailbone, at the top of the crease of the buttocks (pilonidal area).  If the cyst becomes irritated or infected, it may get larger and fill with pus. An infected cyst is called an abscess.  The cause of this condition is not always known. In some cases, a hair that grows into your skin (ingrown hair) may be the cause.  If your cyst does not cause symptoms, you may not need any treatment. If  your cyst bothers you or is infected, you may need a procedure to drain or remove the cyst. This information is not intended to replace advice given to you by your health care provider. Make sure you discuss any questions you have with your health care provider. Document Revised: 10/08/2017 Document Reviewed: 10/08/2017 Elsevier Patient Education  [place].

## 2020-07-11 NOTE — Progress Notes (Signed)
Subjective:    Patient ID: Lawrence [name], male    DOB: 10/30/1951, 69 y.o.   MRN: [id number]  This visit occurred during the SARS-CoV-2 public health emergency.  Safety protocols were in place, including screening questions prior to the visit, additional usage of staff PPE, and extensive cleaning of exam room while observing appropriate contact time as indicated for disinfecting solutions.   HPI   [name] is a 69 year old male with a significant history of coronary artery disease, hypertension, MI and  type 1 diabetes who presents today with a chief compliant of sore on cleft of his buttocks. This sore has been present for the last several months he thinks before summer, unsure the exact time frame. He was evaluated by dermatology in June 2021 and was prescribed a spray. He does not recall the name of this spray. He has been using this spray intermittently. His wife did insert a small pin into this area in an attempt to drain with no success. He states this sore will open up at times and leave a small amount of blood in his underwear and other times leave a clear discharge. Pain associated with direct pressure to this area such had sitting for prolonged time. He denies any fevers, nausea, vomiting or diarrhea.      BP Readings from Last 3 Encounters:  07/11/20 118/64  03/21/20 (!) 126/58  03/08/20 116/78     Review of Systems  Constitutional: Negative.   HENT: Negative.   Gastrointestinal: Negative.  Negative for abdominal pain, diarrhea, nausea and vomiting.  Endocrine: Negative.   Skin: Positive for wound.  Neurological: Negative.   Hematological: Negative.   Psychiatric/Behavioral: Negative.        Past Medical History:  Diagnosis Date  . Allergies   . Bronchitis    PMH  . CAD (coronary artery disease)    07/2007  . Diabetes ([place])   . Elevated lipids   . Glaucoma   . HBP (high blood pressure)   . Headache   . Myocardial infarction ([place])   . Neuropathy   .  Osteoarthritis   . Skin cancer   . Sleep apnea    CPAP  . Wears glasses   . Wears partial dentures      Social History   Socioeconomic History  . Marital status: Married    Spouse name: Not on file  . Number of children: Not on file  . Years of education: Not on file  . Highest education level: Not on file  Occupational History  . Not on file  Tobacco Use  . Smoking status: Former Smoker    Packs/day: 1.00    Years: 16.00    Pack years: 16.00    Types: Cigarettes    Start date: 11/03/1969    Quit date: 07/22/1986    Years since quitting: 33.9  . Smokeless tobacco: Never Used  Vaping Use  . Vaping Use: Never used  Substance and Sexual Activity  . Alcohol use: Not Currently    Alcohol/week: 0.0 standard drinks  . Drug use: No  . Sexual activity: Never  Other Topics Concern  . Not on file  Social History Narrative   Married.  Lives at home with wife.    Social Determinants of Health   Financial Resource Strain:   . Difficulty of Paying Living Expenses: Not on file  Food Insecurity:   . Worried About Charity fundraiser in the Last Year: Not on file  . Ran  Out of Food in the Last Year: Not on file  Transportation Needs:   . Lack of Transportation (Medical): Not on file  . Lack of Transportation (Non-Medical): Not on file  Physical Activity:   . Days of Exercise per Week: Not on file  . Minutes of Exercise per Session: Not on file  Stress:   . Feeling of Stress : Not on file  Social Connections:   . Frequency of Communication with Friends and Family: Not on file  . Frequency of Social Gatherings with Friends and Family: Not on file  . Attends Religious Services: Not on file  . Active Member of Clubs or Organizations: Not on file  . Attends Archivist Meetings: Not on file  . Marital Status: Not on file  Intimate Partner Violence:   . Fear of Current or Ex-Partner: Not on file  . Emotionally Abused: Not on file  . Physically Abused: Not on file  .  Sexually Abused: Not on file    Past Surgical History:  Procedure Laterality Date  . APPENDECTOMY    . CARDIAC CATHETERIZATION N/A 06/11/2016   Procedure: Left Heart Cath and Coronary Angiography;  Surgeon: Corey Skains, MD;  Location: [place] [facility];  Service: Cardiovascular;  Laterality: N/A;  . CARPAL TUNNEL RELEASE Left 04/24/2016   Procedure: CARPAL TUNNEL RELEASE;  Surgeon: Hessie Knows, MD;  Location: [facility];  Service: Orthopedics;  Laterality: Left;  . CATARACT EXTRACTION W/ INTRAOCULAR LENS  IMPLANT, BILATERAL Bilateral   . CATARACT EXTRACTION, BILATERAL     R eye 07/16/12, L eye 08/13/12 - with lens implant in both eyes  . CORONARY ARTERY BYPASS GRAFT N/A 06/27/2016   Procedure: CORONARY ARTERY BYPASS GRAFTING times four using left internal mammary artery and right leg saphenous vein;  Surgeon: Ivin Poot, MD;  Location: [place];  Service: Open Heart Surgery;  Laterality: N/A;  . EYE SURGERY Bilateral   . JOINT REPLACEMENT    . KNEE ARTHROPLASTY Left 02/11/2016   Procedure: COMPUTER ASSISTED TOTAL KNEE ARTHROPLASTY;  Surgeon: Dereck Leep, MD;  Location: [facility];  Service: Orthopedics;  Laterality: Left;  . KNEE ARTHROSCOPY Right   . LEFT HEART CATH AND CORS/GRAFTS ANGIOGRAPHY Left 01/13/2017   Procedure: Left Heart Cath and Cors/Grafts Angiography;  Surgeon: Corey Skains, MD;  Location: [place] [facility];  Service: Cardiovascular;  Laterality: Left;  . METATARSAL HEAD EXCISION Right 03/21/2020   Procedure: METATARSAL HEAD RESECTION RIGHT AND DEBRIDEMENT OF ULCER;  Surgeon: [name], DPM;  Location: [place];  Service: Podiatry;  Laterality: Right;  . MULTIPLE TOOTH EXTRACTIONS    . TEE WITHOUT CARDIOVERSION N/A 06/27/2016   Procedure: TRANSESOPHAGEAL ECHOCARDIOGRAM (TEE);  Surgeon: Ivin Poot, MD;  Location: [place];  Service: Open Heart Surgery;  Laterality: N/A;    Family History  Problem Relation Age of Onset  . Drug abuse Brother   . Hypertension  Mother   . Lupus Mother   . Hypertension Father   . Diabetes Father   . Heart attack Father 24       died in his 2s  . Arthritis Maternal Grandmother   . Arthritis Maternal Grandfather   . Arthritis Paternal Grandmother   . Arthritis Paternal Grandfather   . Heart disease Brother     Allergies  Allergen Reactions  . Nitroglycerin Nausea Only and Other (See Comments)    Patches only - headache   . Statins Other (See Comments)    BODY  PAIN, Pt taking Crestor*     . Lipitor [Atorvastatin] Other (See Comments) and Rash    BODY PAIN  . Lisinopril Other (See Comments) and Nausea Only    Light headed, dizzy Other reaction(s): Dizziness    Current Outpatient Medications on File Prior to Visit  Medication Sig Dispense Refill  . aspirin 81 MG EC tablet Take 81 mg by mouth daily. Swallow whole.    [name] aspirin-acetaminophen-caffeine (EXCEDRIN MIGRAINE) 250-250-65 MG tablet Take by mouth every 6 (six) hours as needed for headache.    . Calcium Carbonate (CALCIUM 600 PO) Take 600 mg by mouth daily.    . cholecalciferol (VITAMIN D) 1000 units tablet Take 2,000 Units by mouth in the morning and at bedtime.     . clopidogrel (PLAVIX) 75 MG tablet Take 1 tablet (75 mg total) by mouth daily. 30 tablet 0  . collagenase (SANTYL) ointment Apply 1 application topically daily. 30 g 1  . famotidine (PEPCID) 20 MG tablet Take 1 tablet (20 mg total) by mouth daily. 30 tablet 0  . furosemide (LASIX) 40 MG tablet Take 1 tablet (40 mg total) by mouth daily. 30 tablet 1  . gentamicin cream (GARAMYCIN) 0.1 % Apply 1 application topically 3 (three) times daily. (Patient not taking: Reported on 03/12/2020) 30 g 1  . glucagon (GLUCAGON EMERGENCY) 1 MG injection Inject 1 mg into the vein once as needed. Reported on 02/11/2016    . GLUTOSE 15 40 % GEL     . insulin aspart (NOVOLOG) 100 UNIT/ML injection Inject 20 Units into the skin 3 (three) times daily. (Patient taking differently: Inject 15-20 Units into the  skin 3 (three) times daily with meals. ) 10 mL 5  . insulin glargine (LANTUS) 100 UNIT/ML injection Inject 0.25 mLs (25 Units total) into the skin at bedtime. 30 mL 1  . isosorbide mononitrate (IMDUR) 60 MG 24 hr tablet Take 60 mg by mouth daily.     [name] l-methylfolate-B6-B12 (METANX) 3-35-2 MG TABS tablet TAKE 1 TABLET BY MOUTH DAILY 60 tablet 3  . loratadine (CLARITIN) 10 MG tablet Take 10 mg by mouth daily.    . metoprolol tartrate (LOPRESSOR) 25 MG tablet Take 25 mg by mouth 2 (two) times daily.    . Multiple Vitamin (MULTIVITAMIN) capsule Take 1 capsule by mouth daily.    [name] oxyCODONE-acetaminophen (PERCOCET) 5-325 MG tablet Take 1 tablet by mouth every 6 (six) hours as needed for severe pain. 30 tablet 0  . potassium chloride SA (K-DUR,KLOR-CON) 20 MEQ tablet Take 1 tablet (20 mEq total) by mouth daily. 30 tablet 1  . ranolazine (RANEXA) 1000 MG SR tablet Take 1,000 mg by mouth 2 (two) times daily.    . rosuvastatin (CRESTOR) 20 MG tablet Take 1 tablet (20 mg total) by mouth daily at 6 PM. 30 tablet 1  . Travoprost, BAK Free, (TRAVATAN) 0.004 % SOLN ophthalmic solution Place 1 drop into both eyes at bedtime.     . triamcinolone ointment (KENALOG) 0.1 % Apply 1 application topically 2 (two) times daily.    . vitamin C (ASCORBIC ACID) 500 MG tablet Take 500 mg by mouth 2 (two) times daily.      No current facility-administered medications on file prior to visit.    BP 118/64   Pulse 64   Ht 5' 10.5" (1.791 m)   Wt (!) 303 lb (137.4 kg)   SpO2 98%   BMI 42.86 kg/m        Objective:  Physical Exam Constitutional:      Appearance: Normal appearance.  Eyes:     Pupils: Pupils are equal, round, and reactive to light.  Cardiovascular:     Rate and Rhythm: Normal rate and regular rhythm.     Pulses: Normal pulses.     Heart sounds: Normal heart sounds.  Pulmonary:     Effort: Pulmonary effort is normal.     Breath sounds: Normal breath sounds.  Musculoskeletal:     Cervical  back: Normal range of motion and neck supple.  Skin:    General: Skin is warm and dry.     Findings: Wound present.       Neurological:     General: No focal deficit present.     Mental Status: He is alert and oriented to person, place, and time.  Psychiatric:        Mood and Affect: Mood normal.          Assessment & Plan:

## 2020-07-11 NOTE — Telephone Encounter (Signed)
Noted. He will see dermatology next month as recommended.

## 2020-07-11 NOTE — Telephone Encounter (Signed)
Pt said the name of the medication [facility] gave him is called cavilon no sting barrier film. He said you would want to know.

## 2020-07-12 DIAGNOSIS — I6523 Occlusion and stenosis of bilateral carotid arteries: Secondary | ICD-10-CM | POA: Diagnosis not present

## 2020-07-12 DIAGNOSIS — E782 Mixed hyperlipidemia: Secondary | ICD-10-CM | POA: Diagnosis not present

## 2020-07-12 DIAGNOSIS — I10 Essential (primary) hypertension: Secondary | ICD-10-CM | POA: Diagnosis not present

## 2020-07-12 DIAGNOSIS — I25708 Atherosclerosis of coronary artery bypass graft(s), unspecified, with other forms of angina pectoris: Secondary | ICD-10-CM | POA: Diagnosis not present

## 2020-07-12 DIAGNOSIS — Z6841 Body Mass Index (BMI) 40.0 and over, adult: Secondary | ICD-10-CM | POA: Diagnosis not present

## 2020-07-31 ENCOUNTER — Ambulatory Visit (INDEPENDENT_AMBULATORY_CARE_PROVIDER_SITE_OTHER): Payer: Medicare Other | Admitting: Podiatry

## 2020-07-31 ENCOUNTER — Other Ambulatory Visit: Payer: Self-pay

## 2020-07-31 DIAGNOSIS — E0842 Diabetes mellitus due to underlying condition with diabetic polyneuropathy: Secondary | ICD-10-CM

## 2020-07-31 DIAGNOSIS — B351 Tinea unguium: Secondary | ICD-10-CM | POA: Diagnosis not present

## 2020-07-31 DIAGNOSIS — M79676 Pain in unspecified toe(s): Secondary | ICD-10-CM

## 2020-07-31 DIAGNOSIS — L989 Disorder of the skin and subcutaneous tissue, unspecified: Secondary | ICD-10-CM | POA: Diagnosis not present

## 2020-07-31 NOTE — Progress Notes (Signed)
   Subjective:  Patient PMHx diabetes mellitus type 2 presents today for routine foot care.  Patient states he has been doing very well.  He complains of elongated thickened nails noted to the bilateral feet.  He also states that the calluses have returned.  He is unable to trim his own nails.  Past Medical History:  Diagnosis Date  . Allergies   . Bronchitis    PMH  . CAD (coronary artery disease)    07/2007  . Diabetes ([place])   . Elevated lipids   . Glaucoma   . HBP (high blood pressure)   . Headache   . Myocardial infarction ([place])   . Neuropathy   . Osteoarthritis   . Skin cancer   . Sleep apnea    CPAP  . Wears glasses   . Wears partial dentures       Objective/Physical Exam General: The patient is alert and oriented x3 in no acute distress.  Dermatology: Skin is cool, dry and supple bilateral lower extremities. Negative for open lesions or macerations.  Hyperkeratotic dystrophic nails noted 1-5 bilateral.  Hyperkeratotic preulcerative callus lesions noted bilateral  Vascular: Palpable pedal pulses bilaterally. No edema or erythema noted. Capillary refill within normal limits.  Neurological: Epicritic and protective threshold absent bilaterally.   Musculoskeletal Exam: History of left second toe amputation    Assessment: 1. s/p 5th metatarsal head resection right. DOS: 03/15/2020 2.  Diabetes mellitus with peripheral polyneuropathy 3.  Pain due to onychomycosis of toenail bilateral 1-5 4.  Preulcerative callus lesions bilateral feet  Plan of Care:  1. Patient was evaluated.  2.  Mechanical debridement of nails 1-5 bilateral was performed using a nail nipper without incident or bleeding 3.  Excisional debridement of the preulcerative callus tissue was performed using a chisel blade without incident or bleeding 4.  Return to clinic in 3 months for routine foot care  [name], DPM [facility]  Dr. [name], [address]                                         [place], [place] [address]                Office [phone]  Fax [phone]

## 2020-08-06 DIAGNOSIS — H353221 Exudative age-related macular degeneration, left eye, with active choroidal neovascularization: Secondary | ICD-10-CM | POA: Diagnosis not present

## 2020-10-09 DIAGNOSIS — H353221 Exudative age-related macular degeneration, left eye, with active choroidal neovascularization: Secondary | ICD-10-CM | POA: Diagnosis not present

## 2020-10-30 ENCOUNTER — Ambulatory Visit: Payer: Medicare Other | Admitting: Podiatry

## 2020-11-13 ENCOUNTER — Other Ambulatory Visit: Payer: Self-pay

## 2020-11-13 ENCOUNTER — Ambulatory Visit (INDEPENDENT_AMBULATORY_CARE_PROVIDER_SITE_OTHER): Payer: Medicare Other | Admitting: Podiatry

## 2020-11-13 DIAGNOSIS — B351 Tinea unguium: Secondary | ICD-10-CM

## 2020-11-13 DIAGNOSIS — L03032 Cellulitis of left toe: Secondary | ICD-10-CM | POA: Diagnosis not present

## 2020-11-13 DIAGNOSIS — E0842 Diabetes mellitus due to underlying condition with diabetic polyneuropathy: Secondary | ICD-10-CM

## 2020-11-13 DIAGNOSIS — L84 Corns and callosities: Secondary | ICD-10-CM | POA: Diagnosis not present

## 2020-11-13 DIAGNOSIS — L989 Disorder of the skin and subcutaneous tissue, unspecified: Secondary | ICD-10-CM

## 2020-11-13 DIAGNOSIS — L603 Nail dystrophy: Secondary | ICD-10-CM

## 2020-11-13 NOTE — Progress Notes (Signed)
   Subjective:  Patient PMHx diabetes mellitus type 2 presents today for routine foot care.  Patient states he has been doing very well.  He complains of elongated thickened nails noted to the bilateral feet.  He also states that the calluses have returned.  He is unable to trim his own nails.  Past Medical History:  Diagnosis Date  . Allergies   . Bronchitis    PMH  . CAD (coronary artery disease)    07/2007  . Diabetes ([place])   . Elevated lipids   . Glaucoma   . HBP (high blood pressure)   . Headache   . Myocardial infarction ([place])   . Neuropathy   . Osteoarthritis   . Skin cancer   . Sleep apnea    CPAP  . Wears glasses   . Wears partial dentures       Objective/Physical Exam General: The patient is alert and oriented x3 in no acute distress.  Dermatology: Skin is cool, dry and supple bilateral lower extremities. Negative for open lesions or macerations.  Hyperkeratotic dystrophic nails noted 1-5 bilateral.  Hyperkeratotic preulcerative callus lesions noted bilateral Purulent abscess subungual left hallux noted with debridement of the nail plate and purulence coming from the nail site.  No malodor noted.  No.  Ungual erythema was noted.  Vascular: Palpable pedal pulses bilaterally. No edema or erythema noted. Capillary refill within normal limits.  Neurological: Epicritic and protective threshold absent bilaterally.   Musculoskeletal Exam: History of left second toe amputation    Assessment: 1. s/p 5th metatarsal head resection right. DOS: 03/15/2020 2.  Diabetes mellitus with peripheral polyneuropathy 3.  Pain due to onychomycosis of toenail bilateral 1-5 4.  Preulcerative callus lesions bilateral feet 5.  Localized subungual abscess left hallux  Plan of Care:  1. Patient was evaluated.  2.  Mechanical debridement of nails 1-5 bilateral was performed using a nail nipper without incident or bleeding 3.  Excisional debridement of the preulcerative callus tissue was  performed using a chisel blade without incident or bleeding 4.  After evaluation of the left hallux nail plate and the purulence underlying the nail plate the decision was made to totally temporarily remove the nail plate.  No local anesthesia infiltration was utilized since the patient is completely neuropathic and has no sensation.  The toenail was prepped in aseptic manner and avulsed in toto.  Light dressing applied with post care instructions provided. 5.  Return to clinic in 2 weeks  [name], DPM [facility]  Dr. [name], DPM    [address], [place] [address]                Office [phone]  Fax [phone]

## 2020-11-27 ENCOUNTER — Ambulatory Visit (INDEPENDENT_AMBULATORY_CARE_PROVIDER_SITE_OTHER): Payer: Medicare Other | Admitting: Podiatry

## 2020-11-27 ENCOUNTER — Other Ambulatory Visit: Payer: Self-pay

## 2020-11-27 DIAGNOSIS — E0842 Diabetes mellitus due to underlying condition with diabetic polyneuropathy: Secondary | ICD-10-CM

## 2020-11-27 DIAGNOSIS — M79676 Pain in unspecified toe(s): Secondary | ICD-10-CM

## 2020-11-27 DIAGNOSIS — B351 Tinea unguium: Secondary | ICD-10-CM

## 2020-11-27 NOTE — Progress Notes (Signed)
   HPI: 70 y.o. male presenting today status post left hallux nail avulsion.  Patient doing well.  He has been applying Betadine ointment and a Band-Aid daily.  No new complaints at this time  Past Medical History:  Diagnosis Date  . Allergies   . Bronchitis    PMH  . CAD (coronary artery disease)    07/2007  . Diabetes ([place])   . Elevated lipids   . Glaucoma   . HBP (high blood pressure)   . Headache   . Myocardial infarction ([place])   . Neuropathy   . Osteoarthritis   . Skin cancer   . Sleep apnea    CPAP  . Wears glasses   . Wears partial dentures      Physical Exam: Nailbed to the left hallux appears to be healing appropriately.  Minimal drainage noted.  No clinical evidence of infection.  Assessment: 1.  Status post total temporary nail avulsion left hallux   Plan of Care:  1. Patient evaluated.  2.  Light debridement performed 3.  Recommend antibiotic ointment a Band-Aid x1 additional week 4.  Patient may resume normal shoes.  Discontinue postsurgical shoe 5.  Return to clinic for routine foot care      [name], DPM [facility]  Dr. [name], DPM    [address], [place] [address]                Office [phone]  Fax [phone]

## 2020-12-11 DIAGNOSIS — H353221 Exudative age-related macular degeneration, left eye, with active choroidal neovascularization: Secondary | ICD-10-CM | POA: Diagnosis not present

## 2020-12-11 DIAGNOSIS — E113593 Type 2 diabetes mellitus with proliferative diabetic retinopathy without macular edema, bilateral: Secondary | ICD-10-CM | POA: Diagnosis not present

## 2020-12-26 DIAGNOSIS — E113593 Type 2 diabetes mellitus with proliferative diabetic retinopathy without macular edema, bilateral: Secondary | ICD-10-CM | POA: Diagnosis not present

## 2020-12-26 DIAGNOSIS — E1139 Type 2 diabetes mellitus with other diabetic ophthalmic complication: Secondary | ICD-10-CM | POA: Diagnosis not present

## 2021-01-08 DIAGNOSIS — D2262 Melanocytic nevi of left upper limb, including shoulder: Secondary | ICD-10-CM | POA: Diagnosis not present

## 2021-01-08 DIAGNOSIS — D225 Melanocytic nevi of trunk: Secondary | ICD-10-CM | POA: Diagnosis not present

## 2021-01-08 DIAGNOSIS — Z85828 Personal history of other malignant neoplasm of skin: Secondary | ICD-10-CM | POA: Diagnosis not present

## 2021-01-08 DIAGNOSIS — I872 Venous insufficiency (chronic) (peripheral): Secondary | ICD-10-CM | POA: Diagnosis not present

## 2021-01-08 DIAGNOSIS — D2261 Melanocytic nevi of right upper limb, including shoulder: Secondary | ICD-10-CM | POA: Diagnosis not present

## 2021-01-15 DIAGNOSIS — H353221 Exudative age-related macular degeneration, left eye, with active choroidal neovascularization: Secondary | ICD-10-CM | POA: Diagnosis not present

## 2021-01-28 ENCOUNTER — Telehealth: Payer: Self-pay | Admitting: Podiatry

## 2021-02-07 DIAGNOSIS — E113593 Type 2 diabetes mellitus with proliferative diabetic retinopathy without macular edema, bilateral: Secondary | ICD-10-CM | POA: Diagnosis not present

## 2021-02-11 NOTE — Telephone Encounter (Signed)
Pt presented to office requesting refill. Please advice.

## 2021-02-25 DIAGNOSIS — H353221 Exudative age-related macular degeneration, left eye, with active choroidal neovascularization: Secondary | ICD-10-CM | POA: Diagnosis not present

## 2021-02-26 ENCOUNTER — Ambulatory Visit (INDEPENDENT_AMBULATORY_CARE_PROVIDER_SITE_OTHER): Payer: Medicare Other | Admitting: Podiatry

## 2021-02-26 ENCOUNTER — Other Ambulatory Visit: Payer: Self-pay

## 2021-02-26 DIAGNOSIS — M79676 Pain in unspecified toe(s): Secondary | ICD-10-CM | POA: Diagnosis not present

## 2021-02-26 DIAGNOSIS — E0842 Diabetes mellitus due to underlying condition with diabetic polyneuropathy: Secondary | ICD-10-CM

## 2021-02-26 DIAGNOSIS — L989 Disorder of the skin and subcutaneous tissue, unspecified: Secondary | ICD-10-CM | POA: Diagnosis not present

## 2021-02-26 DIAGNOSIS — B351 Tinea unguium: Secondary | ICD-10-CM

## 2021-02-26 DIAGNOSIS — Z6841 Body Mass Index (BMI) 40.0 and over, adult: Secondary | ICD-10-CM | POA: Diagnosis not present

## 2021-02-26 DIAGNOSIS — Z96652 Presence of left artificial knee joint: Secondary | ICD-10-CM | POA: Diagnosis not present

## 2021-02-26 NOTE — Progress Notes (Signed)
   Subjective:  Patient PMHx diabetes mellitus type 2 presents today for routine foot care.  Patient states he has been doing very well.  He complains of elongated thickened nails noted to the bilateral feet.  He also states that the calluses have returned.  He is unable to trim his own nails.  Past Medical History:  Diagnosis Date  . Allergies   . Bronchitis    PMH  . CAD (coronary artery disease)    07/2007  . Diabetes ([place])   . Elevated lipids   . Glaucoma   . HBP (high blood pressure)   . Headache   . Myocardial infarction ([place])   . Neuropathy   . Osteoarthritis   . Skin cancer   . Sleep apnea    CPAP  . Wears glasses   . Wears partial dentures       Objective/Physical Exam General: The patient is alert and oriented x3 in no acute distress.  Dermatology: Skin is cool, dry and supple bilateral lower extremities. Negative for open lesions or macerations.  Hyperkeratotic dystrophic nails noted 1-5 bilateral.  Hyperkeratotic preulcerative callus lesions noted bilateral  Vascular: Palpable pedal pulses bilaterally. No edema or erythema noted. Capillary refill within normal limits.  Neurological: Epicritic and protective threshold absent bilaterally.   Musculoskeletal Exam: History of left second toe amputation    Assessment: 1. s/p 5th metatarsal head resection right. DOS: 03/15/2020 2.  Diabetes mellitus with peripheral polyneuropathy 3.  Pain due to onychomycosis of toenail bilateral 1-5 4.  Preulcerative callus lesions bilateral feet  Plan of Care:  1. Patient was evaluated.  2.  Mechanical debridement of nails 1-5 bilateral was performed using a nail nipper without incident or bleeding 3.  Excisional debridement of the preulcerative callus tissue was performed using a chisel blade without incident or bleeding 4.  Return to clinic in 3 months for routine foot care  [name], DPM [facility]  Dr. [name], DPM    [address], [place] [address]                Office [phone]  Fax [phone]

## 2021-03-20 ENCOUNTER — Telehealth: Payer: Self-pay | Admitting: Primary Care

## 2021-03-20 NOTE — Telephone Encounter (Signed)
This refill request should go to his endocrinologist who manages his diabetes.

## 2021-03-20 NOTE — Telephone Encounter (Signed)
  LAST APPOINTMENT DATE: Visit date not found   NEXT APPOINTMENT DATE:@5 /20/2022  MEDICATION: novolog  PHARMACY: [facility] [address]   Let patient know to contact pharmacy at the end of the day to make sure medication is ready.  Please notify patient to allow 48-72 hours to process  Encourage patient to contact the pharmacy for refills or they can request refills through [facility]:   LAST REFILL:  QTY:  REFILL DATE:    OTHER COMMENTS:    Okay for refill?  Please advise

## 2021-03-21 NOTE — Telephone Encounter (Signed)
Pt is aware as instructed and had already reached out to [place] and has picked up Rx

## 2021-03-21 NOTE — Telephone Encounter (Signed)
Left message to return call to our office.  

## 2021-03-22 ENCOUNTER — Ambulatory Visit (INDEPENDENT_AMBULATORY_CARE_PROVIDER_SITE_OTHER): Payer: Medicare Other | Admitting: Primary Care

## 2021-03-22 ENCOUNTER — Other Ambulatory Visit: Payer: Self-pay

## 2021-03-22 ENCOUNTER — Encounter: Payer: Self-pay | Admitting: Primary Care

## 2021-03-22 ENCOUNTER — Ambulatory Visit (INDEPENDENT_AMBULATORY_CARE_PROVIDER_SITE_OTHER)
Admission: RE | Admit: 2021-03-22 | Discharge: 2021-03-22 | Disposition: A | Discharge status: Home / Self Care | Payer: Medicare Other | Source: Ambulatory Visit | Attending: Primary Care | Admitting: Primary Care

## 2021-03-22 VITALS — BP 122/60 | HR 73 | Temp 97.9°F | Ht 70.5 in | Wt 341.0 lb

## 2021-03-22 DIAGNOSIS — R600 Localized edema: Secondary | ICD-10-CM

## 2021-03-22 DIAGNOSIS — R0789 Other chest pain: Secondary | ICD-10-CM

## 2021-03-22 LAB — BRAIN NATRIURETIC PEPTIDE: Pro B Natriuretic peptide (BNP): 48 pg/mL (ref 0.0–100.0)

## 2021-03-22 LAB — BASIC METABOLIC PANEL
BUN: 24 mg/dL — ABNORMAL HIGH (ref 6–23)
CO2: 30 mEq/L (ref 19–32)
Calcium: 9.5 mg/dL (ref 8.4–10.5)
Chloride: 101 mEq/L (ref 96–112)
Creatinine, Ser: 1.33 mg/dL (ref 0.40–1.50)
GFR: 53.5 mL/min — ABNORMAL LOW (ref >60.00)
Glucose, Bld: 66 mg/dL — ABNORMAL LOW (ref 70–99)
Potassium: 4.1 mEq/L (ref 3.5–5.1)
Sodium: 140 mEq/L (ref 135–145)

## 2021-03-22 NOTE — Patient Instructions (Addendum)
Complete xray(s) and lab prior to leaving today. I will notify you of your results once received.  Increase your furosemide (Lasix) to twice daily for 3-4 days.  You must elevate your legs when resting. Also start walking everyday.   Please call your cardiologist.   Go to the hospital if you feel worse.   It was a pleasure to see you today!

## 2021-03-22 NOTE — Assessment & Plan Note (Signed)
Acute for the last 2-3 weeks, intermittent, no nitroglycerin use.   Checking chest xray, BNP, BMP today.  ECG today with NSR with rate of 68. No acute ST changes. Appears similar to ECG from May 2021.  Will have him follow up with his cardiologist. Strict ED precautions provided.

## 2021-03-22 NOTE — Progress Notes (Signed)
Subjective:    Patient ID: Lawrence [name], male    DOB: [birth year], 70 y.o.   MRN: [id number]  HPI  Lawrence [name] is a very pleasant 70 y.o. male with a history of CAD, obesity, carotid artery stenosis, type 1 diabetes, OSA, osteoarthritis, who presents today to discuss pedal edema and chest tightness.   History or chronic lower extremity edema which waxes and wanes, is followed by cardiology and is managed on furosemide 40 mg daily. Three weeks ago his swelling began again but has not reduced like it typically does. He has noticed some increased shortness of breath and cough when walking to the car or walking in his home. He's also noticed chest pressure/tightness to the center chest.   His wife has been wrapping his legs with ACE bandages, Co-Ban tape with temporary improvement.   He leads a very sedentary lifestyle at home, sits all day, doesn't elevate his legs. Upon waking, his swelling improves, but will progress during the day. His wife has noticed that he's been getting up during the night and snacking on saltine crackers and candy. He's taken an extra dose of furosemide 40 mg a few times which hasn't helped to reduce swelling.  He's compliant to his compression socks 20-30 mmHg, his wife is having a tough time getting them on. His next cardiology visit is scheduled for early June 2022, he has yet to call his cardiologist. He's not taken a nitroglycerin tablet. His wife endorses that he is not weighing daily, but when he does weigh there isn't more than 3 pound gain in 24 hours or 5 pound gain in one week.   Review of Systems  Constitutional: Negative for fever.  Respiratory: Positive for cough and shortness of breath.   Cardiovascular: Positive for chest pain and leg swelling.  Skin: Negative for color change.         Past Medical History:  Diagnosis Date  . Allergies   . Bronchitis    PMH  . CAD (coronary artery disease)    07/2007  . Diabetes ([place])   . Elevated  lipids   . Glaucoma   . HBP (high blood pressure)   . Headache   . Myocardial infarction ([place])   . Neuropathy   . Osteoarthritis   . Skin cancer   . Sleep apnea    CPAP  . Wears glasses   . Wears partial dentures     Social History   Socioeconomic History  . Marital status: Married    Spouse name: Not on file  . Number of children: Not on file  . Years of education: Not on file  . Highest education level: Not on file  Occupational History  . Not on file  Tobacco Use  . Smoking status: Former Smoker    Packs/day: 1.00    Years: 16.00    Pack years: 16.00    Types: Cigarettes    Start date: 11/03/1969    Quit date: 07/22/1986    Years since quitting: 34.6  . Smokeless tobacco: Never Used  Vaping Use  . Vaping Use: Never used  Substance and Sexual Activity  . Alcohol use: Not Currently    Alcohol/week: 0.0 standard drinks  . Drug use: No  . Sexual activity: Never  Other Topics Concern  . Not on file  Social History Narrative   Married.  Lives at home with wife.    Social Determinants of Health   Financial Resource Strain: Not on file  Food Insecurity: Not on file  Transportation Needs: Not on file  Physical Activity: Not on file  Stress: Not on file  Social Connections: Not on file  Intimate Partner Violence: Not on file    Past Surgical History:  Procedure Laterality Date  . APPENDECTOMY    . CARDIAC CATHETERIZATION N/A 06/11/2016   Procedure: Left Heart Cath and Coronary Angiography;  Surgeon: Corey Skains, MD;  Location: [facility];  Service: Cardiovascular;  Laterality: N/A;  . CARPAL TUNNEL RELEASE Left 04/24/2016   Procedure: CARPAL TUNNEL RELEASE;  Surgeon: Hessie Knows, MD;  Location: [facility];  Service: Orthopedics;  Laterality: Left;  . CATARACT EXTRACTION W/ INTRAOCULAR LENS  IMPLANT, BILATERAL Bilateral   . CATARACT EXTRACTION, BILATERAL     R eye 07/16/12, L eye 08/13/12 - with lens implant in both eyes  . CORONARY ARTERY BYPASS  GRAFT N/A 06/27/2016   Procedure: CORONARY ARTERY BYPASS GRAFTING times four using left internal mammary artery and right leg saphenous vein;  Surgeon: Ivin Poot, MD;  Location: [facility];  Service: Open Heart Surgery;  Laterality: N/A;  . EYE SURGERY Bilateral   . JOINT REPLACEMENT    . KNEE ARTHROPLASTY Left 02/11/2016   Procedure: COMPUTER ASSISTED TOTAL KNEE ARTHROPLASTY;  Surgeon: Dereck Leep, MD;  Location: [facility];  Service: Orthopedics;  Laterality: Left;  . KNEE ARTHROSCOPY Right   . LEFT HEART CATH AND CORS/GRAFTS ANGIOGRAPHY Left 01/13/2017   Procedure: Left Heart Cath and Cors/Grafts Angiography;  Surgeon: Corey Skains, MD;  Location: [facility];  Service: Cardiovascular;  Laterality: Left;  . METATARSAL HEAD EXCISION Right 03/21/2020   Procedure: METATARSAL HEAD RESECTION RIGHT AND DEBRIDEMENT OF ULCER;  Surgeon: [name], DPM;  Location: [facility];  Service: Podiatry;  Laterality: Right;  . MULTIPLE TOOTH EXTRACTIONS    . TEE WITHOUT CARDIOVERSION N/A 06/27/2016   Procedure: TRANSESOPHAGEAL ECHOCARDIOGRAM (TEE);  Surgeon: Ivin Poot, MD;  Location: [facility];  Service: Open Heart Surgery;  Laterality: N/A;    Family History  Problem Relation Age of Onset  . Drug abuse Brother   . Hypertension Mother   . Lupus Mother   . Hypertension Father   . Diabetes Father   . Heart attack Father 69       died in his 42s  . Arthritis Maternal Grandmother   . Arthritis Maternal Grandfather   . Arthritis Paternal Grandmother   . Arthritis Paternal Grandfather   . Heart disease Brother     Allergies  Allergen Reactions  . Nitroglycerin Nausea Only and Other (See Comments)    Patches only - headache   . Statins Other (See Comments)    BODY PAIN, Pt taking Crestor*     . Ezetimibe Other (See Comments)  . Fluvastatin     Other reaction(s): Muscle pain  . Fluvastatin Sodium     Other reaction(s): Myalgias  . Other Other (See Comments)  . Rosuvastatin      Other reaction(s): Muscle pain, Myalgias  . Simvastatin     Other reaction(s): Muscle pain, Myalgias  . Lipitor [Atorvastatin] Other (See Comments) and Rash    BODY PAIN  . Lisinopril Other (See Comments) and Nausea Only    Light headed, dizzy Other reaction(s): Dizziness    Current Outpatient Medications on File Prior to Visit  Medication Sig Dispense Refill  . aspirin 81 MG EC tablet Take 81 mg by mouth daily. Swallow whole.    [name] aspirin-acetaminophen-caffeine (EXCEDRIN  MIGRAINE) 250-250-65 MG tablet Take by mouth every 6 (six) hours as needed for headache.    . cholecalciferol (VITAMIN D) 1000 units tablet Take 2,000 Units by mouth in the morning and at bedtime.     . clopidogrel (PLAVIX) 75 MG tablet Take 1 tablet (75 mg total) by mouth daily. 30 tablet 0  . famotidine (PEPCID) 20 MG tablet Take 1 tablet (20 mg total) by mouth daily. 30 tablet 0  . furosemide (LASIX) 40 MG tablet Take 1 tablet (40 mg total) by mouth daily. 30 tablet 1  . glucagon 1 MG injection Inject 1 mg into the vein once as needed. Reported on 02/11/2016    . GLUTOSE 15 40 % GEL     . insulin aspart (NOVOLOG) 100 UNIT/ML injection Inject 20 Units into the skin 3 (three) times daily. (Patient taking differently: Inject 15-20 Units into the skin 3 (three) times daily with meals.) 10 mL 5  . insulin glargine (LANTUS) 100 UNIT/ML injection Inject 0.25 mLs (25 Units total) into the skin at bedtime. 30 mL 1  . isosorbide mononitrate (IMDUR) 60 MG 24 hr tablet Take 60 mg by mouth daily.     [name] l-methylfolate-B6-B12 (METANX) 3-35-2 MG TABS tablet TAKE 1 TABLET BY MOUTH DAILY 60 tablet 3  . loratadine (CLARITIN) 10 MG tablet Take 10 mg by mouth daily.    . metoprolol tartrate (LOPRESSOR) 25 MG tablet Take 25 mg by mouth 2 (two) times daily.    . Multiple Vitamin (MULTIVITAMIN) capsule Take 1 capsule by mouth daily.    . nitroGLYCERIN (NITROSTAT) 0.4 MG SL tablet DISSOLVE 1 TABLET UNDER THE TONGUE EVERY 5 MINUTES AS NEEDED FOR  CHEST PAIN 30 tablet 0  . oxyCODONE-acetaminophen (PERCOCET) 5-325 MG tablet Take 1 tablet by mouth every 6 (six) hours as needed for severe pain. 30 tablet 0  . potassium chloride SA (K-DUR,KLOR-CON) 20 MEQ tablet Take 1 tablet (20 mEq total) by mouth daily. 30 tablet 1  . ranolazine (RANEXA) 1000 MG SR tablet Take 1,000 mg by mouth 2 (two) times daily.    . rosuvastatin (CRESTOR) 20 MG tablet Take 1 tablet (20 mg total) by mouth daily at 6 PM. 30 tablet 1  . Travoprost, BAK Free, (TRAVATAN) 0.004 % SOLN ophthalmic solution Place 1 drop into both eyes at bedtime.     . triamcinolone ointment (KENALOG) 0.1 % Apply 1 application topically 2 (two) times daily.    . vitamin C (ASCORBIC ACID) 500 MG tablet Take 500 mg by mouth 2 (two) times daily.      No current facility-administered medications on file prior to visit.    BP 122/60   Pulse 73   Temp 97.9 F (36.6 C) (Temporal)   Ht 5' 10.5" (1.791 m)   Wt (!) 341 lb (154.7 kg)   SpO2 94%   BMI 48.24 kg/m  Objective:   Physical Exam Cardiovascular:     Rate and Rhythm: Normal rate and regular rhythm.     Comments: Moderate bilateral lower extremity edema with trace pitting.  Pulmonary:     Effort: Pulmonary effort is normal.     Breath sounds: Normal breath sounds. No wheezing or rales.  Musculoskeletal:     Cervical back: Neck supple.  Skin:    General: Skin is warm and dry.     Findings: No erythema.     Comments: No erythema or warmth to lower extremities.  No wounds.  Neurological:     Mental Status: He  is alert and oriented to person, place, and time.           Assessment & Plan:      This visit occurred during the SARS-CoV-2 public health emergency.  Safety protocols were in place, including screening questions prior to the visit, additional usage of staff PPE, and extensive cleaning of exam room while observing appropriate contact time as indicated for disinfecting solutions.

## 2021-03-22 NOTE — Assessment & Plan Note (Signed)
Acute on chronic, does appear more edematous than usual. No evidence of cellulitis, wounds. Lower suspicion for DVT.  Suspect edema is secondary to his sedentary lifestyle without elevation of his extremities. Long discussion regarding this today.   Checking chest xray, BNP, BMP to evaluate for acute CHF flare.   Will have him increase furosemide to 40 mg BID x 3-4 days.

## 2021-04-11 DIAGNOSIS — I25708 Atherosclerosis of coronary artery bypass graft(s), unspecified, with other forms of angina pectoris: Secondary | ICD-10-CM | POA: Diagnosis not present

## 2021-04-11 DIAGNOSIS — I10 Essential (primary) hypertension: Secondary | ICD-10-CM | POA: Diagnosis not present

## 2021-04-11 DIAGNOSIS — G4733 Obstructive sleep apnea (adult) (pediatric): Secondary | ICD-10-CM | POA: Diagnosis not present

## 2021-04-11 DIAGNOSIS — Z6841 Body Mass Index (BMI) 40.0 and over, adult: Secondary | ICD-10-CM | POA: Diagnosis not present

## 2021-04-11 DIAGNOSIS — E782 Mixed hyperlipidemia: Secondary | ICD-10-CM | POA: Diagnosis not present

## 2021-04-11 DIAGNOSIS — I6523 Occlusion and stenosis of bilateral carotid arteries: Secondary | ICD-10-CM | POA: Diagnosis not present

## 2021-04-11 DIAGNOSIS — R600 Localized edema: Secondary | ICD-10-CM | POA: Diagnosis not present

## 2021-04-15 DIAGNOSIS — H353221 Exudative age-related macular degeneration, left eye, with active choroidal neovascularization: Secondary | ICD-10-CM | POA: Diagnosis not present

## 2021-04-30 ENCOUNTER — Other Ambulatory Visit: Payer: Self-pay

## 2021-04-30 ENCOUNTER — Encounter: Payer: Self-pay | Admitting: Primary Care

## 2021-04-30 ENCOUNTER — Ambulatory Visit (INDEPENDENT_AMBULATORY_CARE_PROVIDER_SITE_OTHER): Payer: Medicare Other | Admitting: Primary Care

## 2021-04-30 VITALS — BP 130/62 | HR 62 | Temp 98.3°F | Ht 70.5 in | Wt 332.8 lb

## 2021-04-30 DIAGNOSIS — R519 Headache, unspecified: Secondary | ICD-10-CM

## 2021-04-30 DIAGNOSIS — R42 Dizziness and giddiness: Secondary | ICD-10-CM | POA: Insufficient documentation

## 2021-04-30 HISTORY — DX: Dizziness and giddiness: R42

## 2021-04-30 MED ORDER — TOPIRAMATE 50 MG PO TABS: 50.0000 mg | ORAL_TABLET | Freq: Every day | ORAL | 0 refills | Status: DC

## 2021-04-30 NOTE — Assessment & Plan Note (Signed)
Chronic since childhood, now more frequent. Imaging in 2018 and 2020 without mass/tumor. Neuro exam today stable.  We discussed several options for treatment, we decided to try topiramate 50 mg HS, no obvious drug interactions when prescribing through EMR. We also discussed Flonase for sinus symptoms.   He will update in a few weeks.

## 2021-04-30 NOTE — Assessment & Plan Note (Addendum)
Symptoms sound like a combination of positional dizziness and vertigo.  Chronic for years, not a new issue.  Fortunately he mentioned the symptoms to his cardiologist who did not seem to think this was cardiac related.  Could be medication side effects.  We did discuss meclizine and the as needed use.  We will treat headaches today, he will update in a few weeks

## 2021-04-30 NOTE — Progress Notes (Signed)
Subjective:    Patient ID: Lawrence [name], male    DOB: 1951/02/06, 70 y.o.   MRN: [id number]  HPI  Lawrence [name] is a very pleasant 70 y.o. male with a significant medical history including CAD, hypertension, OSA, type 1 diabetes, osteoarthritis, chronic foot ulcer, morbid obesity who presents today with a chief complaint of dizziness.  He also reports headaches, facial pressure around his eyes, occipital headaches. All symptoms have been intermittent and chronic since 2018 or 2019.   Dizziness occurs with positional changes, has to sit on the side of the bed before getting up, chronic but more frequent, takes "longer to get up and get moving". He does notice intermittent symptoms of a sensation that the "world is spinning", which resolves sporadically within seconds.   All symptoms increased within the last 6 months. He follows with cardiology, he did mention these symptoms, no guidance provided per patient. No changes in medications.   He tried a sinus rinse one week ago which helped to reduce nasal congestion. He does take Claritin daily, he does not use nasal sprays. He has been tested for vertigo years ago, tested negative. He's not tried Meclizine.   Chronic headaches for years, daily headaches, mostly to occipital lobes, severe headaches occur every several months. He does experience photophobia and phonophobia. Has noticed some abnormal vision with headaches. He has a history of migraines, was treated for these at the age of 79 or 9 years. He is currently taking Excedrin Migraine at bedtime which helps him to sleep.   He is receiving eye injections to the left eye for diabetic retinopathy every 4-6 weeks per his retinal provider. He underwent MRI brain in 2018 and CT head in 2020, both negative for mass.   Review of Systems  HENT:  Positive for sinus pressure.   Eyes:  Positive for photophobia and visual disturbance.  Neurological:  Positive for dizziness and headaches.         Past Medical History:  Diagnosis Date   Allergies    Bronchitis    PMH   CAD (coronary artery disease)    07/2007   Diabetes (HCC)    Elevated lipids    Glaucoma    HBP (high blood pressure)    Headache    Myocardial infarction (HCC)    Neuropathy    Osteoarthritis    Skin cancer    Sleep apnea    CPAP   Wears glasses    Wears partial dentures     Social History   Socioeconomic History   Marital status: Married    Spouse name: Not on file   Number of children: Not on file   Years of education: Not on file   Highest education level: Not on file  Occupational History   Not on file  Tobacco Use   Smoking status: Former    Packs/day: 1.00    Years: 16.00    Pack years: 16.00    Types: Cigarettes    Start date: 11/03/1969    Quit date: 07/22/1986    Years since quitting: 34.7   Smokeless tobacco: Never  Vaping Use   Vaping Use: Never used  Substance and Sexual Activity   Alcohol use: Not Currently    Alcohol/week: 0.0 standard drinks   Drug use: No   Sexual activity: Never  Other Topics Concern   Not on file  Social History Narrative   Married.  Lives at home with wife.    Social Determinants  of Health   Financial Resource Strain: Not on file  Food Insecurity: Not on file  Transportation Needs: Not on file  Physical Activity: Not on file  Stress: Not on file  Social Connections: Not on file  Intimate Partner Violence: Not on file    Past Surgical History:  Procedure Laterality Date   APPENDECTOMY     CARDIAC CATHETERIZATION N/A 06/11/2016   Procedure: Left Heart Cath and Coronary Angiography;  Surgeon: Corey Skains, MD;  Location: [facility];  Service: Cardiovascular;  Laterality: N/A;   CARPAL TUNNEL RELEASE Left 04/24/2016   Procedure: CARPAL TUNNEL RELEASE;  Surgeon: Hessie Knows, MD;  Location: [facility];  Service: Orthopedics;  Laterality: Left;   CATARACT EXTRACTION W/ INTRAOCULAR LENS  IMPLANT, BILATERAL Bilateral    CATARACT  EXTRACTION, BILATERAL     R eye 07/16/12, L eye 08/13/12 - with lens implant in both eyes   CORONARY ARTERY BYPASS GRAFT N/A 06/27/2016   Procedure: CORONARY ARTERY BYPASS GRAFTING times four using left internal mammary artery and right leg saphenous vein;  Surgeon: Ivin Poot, MD;  Location: [facility];  Service: Open Heart Surgery;  Laterality: N/A;   EYE SURGERY Bilateral    JOINT REPLACEMENT     KNEE ARTHROPLASTY Left 02/11/2016   Procedure: COMPUTER ASSISTED TOTAL KNEE ARTHROPLASTY;  Surgeon: Dereck Leep, MD;  Location: [facility];  Service: Orthopedics;  Laterality: Left;   KNEE ARTHROSCOPY Right    LEFT HEART CATH AND CORS/GRAFTS ANGIOGRAPHY Left 01/13/2017   Procedure: Left Heart Cath and Cors/Grafts Angiography;  Surgeon: Corey Skains, MD;  Location: [facility];  Service: Cardiovascular;  Laterality: Left;   METATARSAL HEAD EXCISION Right 03/21/2020   Procedure: METATARSAL HEAD RESECTION RIGHT AND DEBRIDEMENT OF ULCER;  Surgeon: [name], DPM;  Location: [facility];  Service: Podiatry;  Laterality: Right;   MULTIPLE TOOTH EXTRACTIONS     TEE WITHOUT CARDIOVERSION N/A 06/27/2016   Procedure: TRANSESOPHAGEAL ECHOCARDIOGRAM (TEE);  Surgeon: Ivin Poot, MD;  Location: [place];  Service: Open Heart Surgery;  Laterality: N/A;    Family History  Problem Relation Age of Onset   Drug abuse Brother    Hypertension Mother    Lupus Mother    Hypertension Father    Diabetes Father    Heart attack Father 36       died in his 3s   Arthritis Maternal Grandmother    Arthritis Maternal Grandfather    Arthritis Paternal Grandmother    Arthritis Paternal Grandfather    Heart disease Brother     Allergies  Allergen Reactions   Nitroglycerin Nausea Only and Other (See Comments)    Patches only - headache    Statins Other (See Comments)    BODY PAIN, Pt taking Crestor*      Ezetimibe Other (See Comments)   Fluvastatin     Other reaction(s): Muscle pain   Fluvastatin  Sodium     Other reaction(s): Myalgias   Other Other (See Comments)   Rosuvastatin     Other reaction(s): Muscle pain, Myalgias   Simvastatin     Other reaction(s): Muscle pain, Myalgias   Lipitor [Atorvastatin] Other (See Comments) and Rash    BODY PAIN   Lisinopril Other (See Comments) and Nausea Only    Light headed, dizzy Other reaction(s): Dizziness    Current Outpatient Medications on File Prior to Visit  Medication Sig Dispense Refill   aspirin 81 MG EC tablet Take 81 mg  by mouth daily. Swallow whole.     aspirin-acetaminophen-caffeine (EXCEDRIN MIGRAINE) 250-250-65 MG tablet Take by mouth every 6 (six) hours as needed for headache.     cholecalciferol (VITAMIN D) 1000 units tablet Take 2,000 Units by mouth in the morning and at bedtime.      clopidogrel (PLAVIX) 75 MG tablet Take 1 tablet (75 mg total) by mouth daily. 30 tablet 0   famotidine (PEPCID) 20 MG tablet Take 1 tablet (20 mg total) by mouth daily. 30 tablet 0   furosemide (LASIX) 40 MG tablet Take 1 tablet (40 mg total) by mouth daily. 30 tablet 1   glucagon 1 MG injection Inject 1 mg into the vein once as needed. Reported on 02/11/2016     GLUTOSE 15 40 % GEL      insulin aspart (NOVOLOG) 100 UNIT/ML injection Inject 20 Units into the skin 3 (three) times daily. (Patient taking differently: Inject 15-20 Units into the skin 3 (three) times daily with meals.) 10 mL 5   insulin glargine (LANTUS) 100 UNIT/ML injection Inject 0.25 mLs (25 Units total) into the skin at bedtime. 30 mL 1   isosorbide mononitrate (IMDUR) 60 MG 24 hr tablet Take 60 mg by mouth daily.      loratadine (CLARITIN) 10 MG tablet Take 10 mg by mouth daily.     metoprolol tartrate (LOPRESSOR) 25 MG tablet Take 25 mg by mouth 2 (two) times daily.     nitroGLYCERIN (NITROSTAT) 0.4 MG SL tablet DISSOLVE 1 TABLET UNDER THE TONGUE EVERY 5 MINUTES AS NEEDED FOR CHEST PAIN 30 tablet 0   potassium chloride SA (K-DUR,KLOR-CON) 20 MEQ tablet Take 1 tablet (20 mEq  total) by mouth daily. 30 tablet 1   ranolazine (RANEXA) 1000 MG SR tablet Take 1,000 mg by mouth 2 (two) times daily.     rosuvastatin (CRESTOR) 20 MG tablet Take 1 tablet (20 mg total) by mouth daily at 6 PM. 30 tablet 1   Travoprost, BAK Free, (TRAVATAN) 0.004 % SOLN ophthalmic solution Place 1 drop into both eyes at bedtime.      triamcinolone ointment (KENALOG) 0.1 % Apply 1 application topically 2 (two) times daily.     vitamin C (ASCORBIC ACID) 500 MG tablet Take 500 mg by mouth 2 (two) times daily.      No current facility-administered medications on file prior to visit.    BP 130/62 (BP Location: Left Arm, Patient Position: Sitting, Cuff Size: Large)   Pulse 62   Temp 98.3 F (36.8 C) (Temporal)   Ht 5' 10.5" (1.791 m)   Wt (!) 332 lb 12 oz (150.9 kg)   SpO2 97%   BMI 47.07 kg/m  Objective:   Physical Exam Cardiovascular:     Rate and Rhythm: Normal rate and regular rhythm.  Pulmonary:     Effort: Pulmonary effort is normal.     Breath sounds: Normal breath sounds. No wheezing or rales.  Musculoskeletal:     Cervical back: Neck supple.  Skin:    General: Skin is warm and dry.  Neurological:     Mental Status: He is alert and oriented to person, place, and time.     Cranial Nerves: No cranial nerve deficit.          Assessment & Plan:      This visit occurred during the SARS-CoV-2 public health emergency.  Safety protocols were in place, including screening questions prior to the visit, additional usage of staff PPE, and extensive cleaning of  exam room while observing appropriate contact time as indicated for disinfecting solutions.

## 2021-04-30 NOTE — Patient Instructions (Signed)
Start topiramate (Topamax) 50 mg once nightly at bedtime for headache prevention.  Stop Excedrin Migraine for headaches.   For Sinus Pressure: Try using Flonase (fluticasone) nasal spray. Instill 1 spray in each nostril twice daily.   You can try Meclizine 12.5 mg as needed for vertigo/dizziness.   It was a pleasure to see you today!

## 2021-05-07 ENCOUNTER — Telehealth: Payer: Self-pay | Admitting: Primary Care

## 2021-05-07 NOTE — Telephone Encounter (Signed)
Noted, will discuss during visit.

## 2021-05-07 NOTE — Telephone Encounter (Signed)
Patient call in stating that the medication topiramate (TOPAMAX) 50 MG table    Is not working and wanted to make [facility] aware . Patient did not want  to make a follow up appointment

## 2021-05-07 NOTE — Telephone Encounter (Signed)
Please thank him for the update. Did the topiramate medication help at all? Even a little? If so then we can try a dose increase to 100 mg.   If not then we can try a different option called amitriptyline. This is technically an antidepressant but is also used for headaches. He would take this at bedtime for headache prevention.

## 2021-05-07 NOTE — Telephone Encounter (Signed)
Spoke to patient, he reports no help at all with the Topirmate and when mentioning the other medication he wanted an appt to come in to discuss options.   Scheduled him for 05/08/21 @ 9:00 am.  Pt agreeable. Dm/cma

## 2021-05-08 ENCOUNTER — Encounter: Payer: Self-pay | Admitting: Primary Care

## 2021-05-08 ENCOUNTER — Ambulatory Visit (INDEPENDENT_AMBULATORY_CARE_PROVIDER_SITE_OTHER): Payer: Medicare Other | Admitting: Primary Care

## 2021-05-08 ENCOUNTER — Other Ambulatory Visit: Payer: Self-pay

## 2021-05-08 DIAGNOSIS — R519 Headache, unspecified: Secondary | ICD-10-CM

## 2021-05-08 NOTE — Assessment & Plan Note (Signed)
No improvement on Topamax 50 mg. Discussed options, we decided to increase Topamax dose to 100 mg and give it another 1-2 weeks.  If no improvement then would discontinue Topamax and try amitriptyline. He agrees. Await update.

## 2021-05-08 NOTE — Progress Notes (Signed)
Subjective:    Patient ID: Lawrence [name], male    DOB: [birth year], 70 y.o.   MRN: [id number]  HPI  Lawrence [name] is a very pleasant 70 y.o. male with a history of CAD, hypertension, OSA, type 1 diabetes, osteoarthritis, chronic headaches who presents today for follow up of headaches.  He was last evaluated one week ago for symptoms of recurrent headaches and dizziness. Headaches have been chronic since childhood, no prior treatment. Fortunately brain imaging in 2018 and 2020 were without mass/tumor. During this visit we decided to start daily preventative headache treatment with Topamax 50 mg HS. He called our office yesterday reporting no improvement.   Today he's noticed no improvement in headaches which are located to occipital lobes, parietal lobes, behind his eyes. He had no problems with Topamax.   His dizziness improved with the full dose of Meclizine.    Review of Systems  Eyes:  Positive for photophobia.  Neurological:  Positive for dizziness and headaches.        Past Medical History:  Diagnosis Date   Allergies    Bronchitis    PMH   CAD (coronary artery disease)    07/2007   Diabetes (HCC)    Elevated lipids    Glaucoma    HBP (high blood pressure)    Headache    Myocardial infarction (HCC)    Neuropathy    Osteoarthritis    Skin cancer    Sleep apnea    CPAP   Wears glasses    Wears partial dentures     Social History   Socioeconomic History   Marital status: Married    Spouse name: Not on file   Number of children: Not on file   Years of education: Not on file   Highest education level: Not on file  Occupational History   Not on file  Tobacco Use   Smoking status: Former    Packs/day: 1.00    Years: 16.00    Pack years: 16.00    Types: Cigarettes    Start date: 11/03/1969    Quit date: 07/22/1986    Years since quitting: 34.8   Smokeless tobacco: Never  Vaping Use   Vaping Use: Never used  Substance and Sexual Activity   Alcohol use:  Not Currently    Alcohol/week: 0.0 standard drinks   Drug use: No   Sexual activity: Never  Other Topics Concern   Not on file  Social History Narrative   Married.  Lives at home with wife.    Social Determinants of Health   Financial Resource Strain: Not on file  Food Insecurity: Not on file  Transportation Needs: Not on file  Physical Activity: Not on file  Stress: Not on file  Social Connections: Not on file  Intimate Partner Violence: Not on file    Past Surgical History:  Procedure Laterality Date   APPENDECTOMY     CARDIAC CATHETERIZATION N/A 06/11/2016   Procedure: Left Heart Cath and Coronary Angiography;  Surgeon: Corey Skains, MD;  Location: [facility];  Service: Cardiovascular;  Laterality: N/A;   CARPAL TUNNEL RELEASE Left 04/24/2016   Procedure: CARPAL TUNNEL RELEASE;  Surgeon: Hessie Knows, MD;  Location: [facility];  Service: Orthopedics;  Laterality: Left;   CATARACT EXTRACTION W/ INTRAOCULAR LENS  IMPLANT, BILATERAL Bilateral    CATARACT EXTRACTION, BILATERAL     R eye 07/16/12, L eye 08/13/12 - with lens implant in both eyes   CORONARY ARTERY  BYPASS GRAFT N/A 06/27/2016   Procedure: CORONARY ARTERY BYPASS GRAFTING times four using left internal mammary artery and right leg saphenous vein;  Surgeon: Ivin Poot, MD;  Location: [place];  Service: Open Heart Surgery;  Laterality: N/A;   EYE SURGERY Bilateral    JOINT REPLACEMENT     KNEE ARTHROPLASTY Left 02/11/2016   Procedure: COMPUTER ASSISTED TOTAL KNEE ARTHROPLASTY;  Surgeon: Dereck Leep, MD;  Location: [facility];  Service: Orthopedics;  Laterality: Left;   KNEE ARTHROSCOPY Right    LEFT HEART CATH AND CORS/GRAFTS ANGIOGRAPHY Left 01/13/2017   Procedure: Left Heart Cath and Cors/Grafts Angiography;  Surgeon: Corey Skains, MD;  Location: [facility];  Service: Cardiovascular;  Laterality: Left;   METATARSAL HEAD EXCISION Right 03/21/2020   Procedure: METATARSAL HEAD RESECTION RIGHT AND  DEBRIDEMENT OF ULCER;  Surgeon: [name], DPM;  Location: [place];  Service: Podiatry;  Laterality: Right;   MULTIPLE TOOTH EXTRACTIONS     TEE WITHOUT CARDIOVERSION N/A 06/27/2016   Procedure: TRANSESOPHAGEAL ECHOCARDIOGRAM (TEE);  Surgeon: Ivin Poot, MD;  Location: [place];  Service: Open Heart Surgery;  Laterality: N/A;    Family History  Problem Relation Age of Onset   Drug abuse Brother    Hypertension Mother    Lupus Mother    Hypertension Father    Diabetes Father    Heart attack Father 39       died in his 56s   Arthritis Maternal Grandmother    Arthritis Maternal Grandfather    Arthritis Paternal Grandmother    Arthritis Paternal Grandfather    Heart disease Brother     Allergies  Allergen Reactions   Nitroglycerin Nausea Only and Other (See Comments)    Patches only - headache    Statins Other (See Comments)    BODY PAIN, Pt taking Crestor*      Ezetimibe Other (See Comments)   Fluvastatin     Other reaction(s): Muscle pain   Fluvastatin Sodium     Other reaction(s): Myalgias   Other Other (See Comments)   Rosuvastatin     Other reaction(s): Muscle pain, Myalgias   Simvastatin     Other reaction(s): Muscle pain, Myalgias   Lipitor [Atorvastatin] Other (See Comments) and Rash    BODY PAIN   Lisinopril Other (See Comments) and Nausea Only    Light headed, dizzy Other reaction(s): Dizziness    Current Outpatient Medications on File Prior to Visit  Medication Sig Dispense Refill   aspirin 81 MG EC tablet Take 81 mg by mouth daily. Swallow whole.     cholecalciferol (VITAMIN D) 1000 units tablet Take 2,000 Units by mouth in the morning and at bedtime.      clopidogrel (PLAVIX) 75 MG tablet Take 1 tablet (75 mg total) by mouth daily. 30 tablet 0   famotidine (PEPCID) 20 MG tablet Take 1 tablet (20 mg total) by mouth daily. 30 tablet 0   furosemide (LASIX) 40 MG tablet Take 1 tablet (40 mg total) by mouth daily. 30 tablet 1   glucagon 1 MG injection  Inject 1 mg into the vein once as needed. Reported on 02/11/2016     GLUTOSE 15 40 % GEL      insulin aspart (NOVOLOG) 100 UNIT/ML injection Inject 20 Units into the skin 3 (three) times daily. (Patient taking differently: Inject 15-20 Units into the skin 3 (three) times daily with meals.) 10 mL 5   insulin glargine (LANTUS) 100 UNIT/ML injection  Inject 0.25 mLs (25 Units total) into the skin at bedtime. 30 mL 1   isosorbide mononitrate (IMDUR) 60 MG 24 hr tablet Take 60 mg by mouth daily.      loratadine (CLARITIN) 10 MG tablet Take 10 mg by mouth daily.     metoprolol tartrate (LOPRESSOR) 25 MG tablet Take 25 mg by mouth 2 (two) times daily.     nitroGLYCERIN (NITROSTAT) 0.4 MG SL tablet DISSOLVE 1 TABLET UNDER THE TONGUE EVERY 5 MINUTES AS NEEDED FOR CHEST PAIN 30 tablet 0   potassium chloride SA (K-DUR,KLOR-CON) 20 MEQ tablet Take 1 tablet (20 mEq total) by mouth daily. 30 tablet 1   ranolazine (RANEXA) 1000 MG SR tablet Take 1,000 mg by mouth 2 (two) times daily.     rosuvastatin (CRESTOR) 20 MG tablet Take 1 tablet (20 mg total) by mouth daily at 6 PM. 30 tablet 1   topiramate (TOPAMAX) 50 MG tablet Take 1 tablet (50 mg total) by mouth at bedtime. For headache prevention. 90 tablet 0   Travoprost, BAK Free, (TRAVATAN) 0.004 % SOLN ophthalmic solution Place 1 drop into both eyes at bedtime.      triamcinolone ointment (KENALOG) 0.1 % Apply 1 application topically 2 (two) times daily.     vitamin C (ASCORBIC ACID) 500 MG tablet Take 500 mg by mouth 2 (two) times daily.      No current facility-administered medications on file prior to visit.    BP 132/66   Pulse 76   Temp 98.4 F (36.9 C) (Temporal)   Ht 5' 10.5" (1.791 m)   Wt (!) 334 lb (151.5 kg)   SpO2 97%   BMI 47.25 kg/m  Objective:   Physical Exam HENT:     Right Ear: Tympanic membrane and ear canal normal.     Left Ear: Tympanic membrane and ear canal normal.  Cardiovascular:     Rate and Rhythm: Normal rate.  Pulmonary:      Effort: Pulmonary effort is normal.     Breath sounds: No wheezing or rales.  Musculoskeletal:     Cervical back: Neck supple.  Skin:    General: Skin is warm and dry.  Neurological:     Mental Status: He is alert and oriented to person, place, and time.          Assessment & Plan:      This visit occurred during the SARS-CoV-2 public health emergency.  Safety protocols were in place, including screening questions prior to the visit, additional usage of staff PPE, and extensive cleaning of exam room while observing appropriate contact time as indicated for disinfecting solutions.

## 2021-05-08 NOTE — Patient Instructions (Signed)
Increase your Topamax to 2 tablets at bedtime for headache prevention.   Update me on July 19th as discussed.  It was a pleasure to see you today!

## 2021-05-28 ENCOUNTER — Other Ambulatory Visit: Payer: Self-pay

## 2021-05-28 ENCOUNTER — Ambulatory Visit (INDEPENDENT_AMBULATORY_CARE_PROVIDER_SITE_OTHER): Payer: Medicare Other | Admitting: Podiatry

## 2021-05-28 DIAGNOSIS — M79676 Pain in unspecified toe(s): Secondary | ICD-10-CM

## 2021-05-28 DIAGNOSIS — B351 Tinea unguium: Secondary | ICD-10-CM | POA: Diagnosis not present

## 2021-05-28 DIAGNOSIS — E0842 Diabetes mellitus due to underlying condition with diabetic polyneuropathy: Secondary | ICD-10-CM | POA: Diagnosis not present

## 2021-05-28 DIAGNOSIS — L989 Disorder of the skin and subcutaneous tissue, unspecified: Secondary | ICD-10-CM

## 2021-05-28 NOTE — Progress Notes (Signed)
   Subjective:  Patient PMHx diabetes mellitus type 2 presents today for routine foot care.  Patient states he has been doing very well.  He complains of elongated thickened nails noted to the bilateral feet.  He also states that the calluses have returned.  He is unable to trim his own nails.  Past Medical History:  Diagnosis Date   Allergies    Bronchitis    PMH   CAD (coronary artery disease)    07/2007   Diabetes (HCC)    Elevated lipids    Glaucoma    HBP (high blood pressure)    Headache    Myocardial infarction (HCC)    Neuropathy    Osteoarthritis    Skin cancer    Sleep apnea    CPAP   Wears glasses    Wears partial dentures       Objective/Physical Exam General: The patient is alert and oriented x3 in no acute distress.  Dermatology: Skin is cool, dry and supple bilateral lower extremities. Negative for open lesions or macerations.  Hyperkeratotic dystrophic nails noted 1-5 bilateral.  Hyperkeratotic preulcerative callus lesions noted bilateral  Vascular: Palpable pedal pulses bilaterally. No edema or erythema noted. Capillary refill within normal limits.  Neurological: Epicritic and protective threshold absent bilaterally.   Musculoskeletal Exam: History of left second toe amputation    Assessment: 1.  H/o 5th metatarsal head resection right. DOS: 03/15/2020 2.  Diabetes mellitus with peripheral polyneuropathy 3.  Pain due to onychomycosis of toenail bilateral 1-5 4.  Preulcerative callus lesions bilateral feet  Plan of Care:  1. Patient was evaluated.  2.  Mechanical debridement of nails 1-5 bilateral was performed using a nail nipper without incident or bleeding 3.  Excisional debridement of the preulcerative callus tissue was performed using a chisel blade without incident or bleeding 4.  Return to clinic in 3 months for routine foot care  [name], DPM [facility]  Dr. [name], DPM    [address], [place] [address]                Office [phone]  Fax [phone]

## 2021-06-03 DIAGNOSIS — H353221 Exudative age-related macular degeneration, left eye, with active choroidal neovascularization: Secondary | ICD-10-CM | POA: Diagnosis not present

## 2021-06-07 ENCOUNTER — Other Ambulatory Visit: Payer: Self-pay | Admitting: Otolaryngology

## 2021-06-07 ENCOUNTER — Other Ambulatory Visit (HOSPITAL_COMMUNITY): Payer: Self-pay | Admitting: Otolaryngology

## 2021-06-07 DIAGNOSIS — H903 Sensorineural hearing loss, bilateral: Secondary | ICD-10-CM | POA: Diagnosis not present

## 2021-06-07 DIAGNOSIS — R519 Headache, unspecified: Secondary | ICD-10-CM

## 2021-06-07 DIAGNOSIS — R42 Dizziness and giddiness: Secondary | ICD-10-CM | POA: Diagnosis not present

## 2021-06-18 DIAGNOSIS — E782 Mixed hyperlipidemia: Secondary | ICD-10-CM | POA: Diagnosis not present

## 2021-06-19 ENCOUNTER — Other Ambulatory Visit: Payer: Self-pay

## 2021-06-19 ENCOUNTER — Ambulatory Visit
Admission: RE | Admit: 2021-06-19 | Discharge: 2021-06-19 | Disposition: A | Discharge status: Home / Self Care | Payer: Medicare Other | Source: Ambulatory Visit | Attending: Otolaryngology | Admitting: Otolaryngology

## 2021-06-19 DIAGNOSIS — R519 Headache, unspecified: Secondary | ICD-10-CM | POA: Diagnosis not present

## 2021-06-19 DIAGNOSIS — R42 Dizziness and giddiness: Secondary | ICD-10-CM

## 2021-06-19 MED ORDER — GADOBUTROL 1 MMOL/ML IV SOLN
10.0000 mL | Freq: Once | INTRAVENOUS | Status: AC | PRN
  Administered 2021-06-19: 10 mL via INTRAVENOUS

## 2021-07-10 DIAGNOSIS — R42 Dizziness and giddiness: Secondary | ICD-10-CM | POA: Diagnosis not present

## 2021-07-12 DIAGNOSIS — R9082 White matter disease, unspecified: Secondary | ICD-10-CM | POA: Diagnosis not present

## 2021-07-12 DIAGNOSIS — R4182 Altered mental status, unspecified: Secondary | ICD-10-CM | POA: Diagnosis not present

## 2021-07-12 DIAGNOSIS — E668 Other obesity: Secondary | ICD-10-CM | POA: Diagnosis not present

## 2021-07-12 DIAGNOSIS — Z9989 Dependence on other enabling machines and devices: Secondary | ICD-10-CM | POA: Diagnosis not present

## 2021-07-12 DIAGNOSIS — Z6830 Body mass index (BMI) 30.0-30.9, adult: Secondary | ICD-10-CM | POA: Diagnosis not present

## 2021-07-12 DIAGNOSIS — G43809 Other migraine, not intractable, without status migrainosus: Secondary | ICD-10-CM | POA: Diagnosis not present

## 2021-07-12 DIAGNOSIS — G4733 Obstructive sleep apnea (adult) (pediatric): Secondary | ICD-10-CM | POA: Diagnosis not present

## 2021-07-12 DIAGNOSIS — E669 Obesity, unspecified: Secondary | ICD-10-CM | POA: Diagnosis not present

## 2021-07-12 DIAGNOSIS — R42 Dizziness and giddiness: Secondary | ICD-10-CM | POA: Diagnosis not present

## 2021-07-12 DIAGNOSIS — R419 Unspecified symptoms and signs involving cognitive functions and awareness: Secondary | ICD-10-CM | POA: Diagnosis not present

## 2021-07-29 DIAGNOSIS — H353221 Exudative age-related macular degeneration, left eye, with active choroidal neovascularization: Secondary | ICD-10-CM | POA: Diagnosis not present

## 2021-07-30 ENCOUNTER — Ambulatory Visit: Payer: Medicare Other | Admitting: Primary Care

## 2021-08-06 DIAGNOSIS — H818X1 Other disorders of vestibular function, right ear: Secondary | ICD-10-CM | POA: Diagnosis not present

## 2021-08-06 DIAGNOSIS — L03211 Cellulitis of face: Secondary | ICD-10-CM | POA: Diagnosis not present

## 2021-08-27 ENCOUNTER — Ambulatory Visit (INDEPENDENT_AMBULATORY_CARE_PROVIDER_SITE_OTHER): Payer: Medicare Other | Admitting: Podiatry

## 2021-08-27 ENCOUNTER — Other Ambulatory Visit: Payer: Self-pay

## 2021-08-27 DIAGNOSIS — B351 Tinea unguium: Secondary | ICD-10-CM

## 2021-08-27 DIAGNOSIS — M79674 Pain in right toe(s): Secondary | ICD-10-CM

## 2021-08-27 DIAGNOSIS — L97512 Non-pressure chronic ulcer of other part of right foot with fat layer exposed: Secondary | ICD-10-CM

## 2021-08-27 DIAGNOSIS — E08621 Diabetes mellitus due to underlying condition with foot ulcer: Secondary | ICD-10-CM | POA: Diagnosis not present

## 2021-08-27 DIAGNOSIS — M79675 Pain in left toe(s): Secondary | ICD-10-CM | POA: Diagnosis not present

## 2021-09-08 NOTE — Progress Notes (Signed)
   Subjective:  70 y.o. male with PMHx of diabetes mellitus presenting today with a new complaint and concern regarding redness to the distal tip of the right great toe.  He is concerned because he is diabetic and has a history of amputations.  He denies a history of injury.  He presents for further treatment and evaluation  Patient is also requesting a nail trim today.  He has thickened elongated nails bilateral and unable to trim his own nails.  They are symptomatic and close toed shoes   Past Medical History:  Diagnosis Date   Allergies    Bronchitis    PMH   CAD (coronary artery disease)    07/2007   Diabetes ([place])    Elevated lipids    Glaucoma    HBP (high blood pressure)    Headache    Myocardial infarction (HCC)    Neuropathy    Osteoarthritis    Skin cancer    Sleep apnea    CPAP   Wears glasses    Wears partial dentures       Objective/Physical Exam General: The patient is alert and oriented x3 in no acute distress.  Dermatology:  Wound #1 noted to the distal tip of the right great toe measuring 1.0 x 0.8 x 0.1 cm (LxWxD).   To the noted ulceration(s), there is no eschar. There is a moderate amount of slough, fibrin, and necrotic tissue noted. Granulation tissue and wound base is red. There is a minimal amount of serosanguineous drainage noted. There is no exposed bone muscle-tendon ligament or joint. There is no malodor. Periwound integrity is intact. Hyperkeratotic dystrophic elongated nails noted 1-5 bilateral skin is warm, dry and supple bilateral lower extremities.  Vascular: Palpable pedal pulses bilaterally. No edema or erythema noted. Capillary refill within normal limits.  Neurological: Epicritic and protective threshold diminished bilaterally.   Musculoskeletal Exam: History of left second toe amputation  Assessment: 1.  Ulcer right hallux secondary to diabetes mellitus 2. diabetes mellitus w/ peripheral neuropathy   Plan of Care:  1. Patient was  evaluated. 2. medically necessary excisional debridement including subcutaneous tissue was performed using a tissue nipper and a chisel blade. Excisional debridement of all the necrotic nonviable tissue down to healthy bleeding viable tissue was performed with post-debridement measurements same as pre-. 3. the wound was cleansed and dry sterile dressing applied. 4.  Patient has gentamicin cream at home.  Recommend gentamicin cream with a light dressing daily 5.  Mechanical debridement of nails 1-5 bilateral was performed using a nail nipper without incident or bleeding 6.  Return to clinic in 3 months or sooner if he develops complications or deterioration of the right hallux wound   [name], DPM [facility]  Dr. [name], [address]                                        [place], [place] [address]                Office [phone]  Fax [phone]

## 2021-09-18 DIAGNOSIS — R440 Auditory hallucinations: Secondary | ICD-10-CM | POA: Diagnosis not present

## 2021-09-18 DIAGNOSIS — G43809 Other migraine, not intractable, without status migrainosus: Secondary | ICD-10-CM | POA: Diagnosis not present

## 2021-09-18 DIAGNOSIS — R9082 White matter disease, unspecified: Secondary | ICD-10-CM | POA: Diagnosis not present

## 2021-09-18 DIAGNOSIS — Z9989 Dependence on other enabling machines and devices: Secondary | ICD-10-CM | POA: Diagnosis not present

## 2021-09-18 DIAGNOSIS — G4733 Obstructive sleep apnea (adult) (pediatric): Secondary | ICD-10-CM | POA: Diagnosis not present

## 2021-09-30 DIAGNOSIS — H353221 Exudative age-related macular degeneration, left eye, with active choroidal neovascularization: Secondary | ICD-10-CM | POA: Diagnosis not present

## 2021-10-08 NOTE — Progress Notes (Signed)
Subjective:   Lawrence [name] is a 70 y.o. male who presents for Medicare Annual/Subsequent preventive examination.  I connected with Lawrence [name] today by telephone and verified that I am speaking with the correct person using two identifiers. Location patient: home Location provider: work Persons participating in the virtual visit: patient, Marine scientist.    I discussed the limitations, risks, security and privacy concerns of performing an evaluation and management service by telephone and the availability of in person appointments. I also discussed with the patient that there may be a patient responsible charge related to this service. The patient expressed understanding and verbally consented to this telephonic visit.    Interactive audio and video telecommunications were attempted between this provider and patient, however failed, due to patient having technical difficulties OR patient did not have access to video capability.  We continued and completed visit with audio only.  Some vital signs may be absent or patient reported.   Time Spent with patient on telephone encounter: 38 minutes  Review of Systems     Cardiac Risk Factors include: advanced age (>52men, >43 women);diabetes mellitus;hypertension;dyslipidemia     Objective:    Today's Vitals   10/10/21 0858  Weight: (!) 325 lb (147.4 kg)  Height: 5\' 10"  (1.778 m)   Body mass index is 46.63 kg/m.  Advanced Directives 10/10/2021 03/21/2020 04/19/2018 04/23/2017 04/22/2017 04/01/2017 03/18/2017  Does Patient Have a Medical Advance Directive? Yes No Yes Yes Yes Yes Yes  Type of Paramedic of [place];Living will - [place];Living will Living will;Healthcare Power of [place];Living will Living will;Healthcare Power of Attorney  Does patient want to make changes to medical advance directive? Yes (MAU/Ambulatory/Procedural Areas -  Information given) - - No - Patient declined - - No - Patient declined  Copy of [place] in Chart? - - Yes Yes - - Yes  Would patient like information on creating a medical advance directive? - No - Patient declined - - - - -    Current Medications (verified) Outpatient Encounter Medications as of 10/10/2021  Medication Sig   aspirin 81 MG EC tablet Take 81 mg by mouth daily. Swallow whole.   cholecalciferol (VITAMIN D) 1000 units tablet Take 2,000 Units by mouth in the morning and at bedtime.    clopidogrel (PLAVIX) 75 MG tablet Take 1 tablet (75 mg total) by mouth daily.   famotidine (PEPCID) 20 MG tablet Take 1 tablet (20 mg total) by mouth daily.   furosemide (LASIX) 40 MG tablet Take 1 tablet (40 mg total) by mouth daily.   glucagon 1 MG injection Inject 1 mg into the vein once as needed. Reported on 02/11/2016   GLUTOSE 15 40 % GEL    insulin aspart (NOVOLOG) 100 UNIT/ML injection Inject 20 Units into the skin 3 (three) times daily. (Patient taking differently: Inject 15-20 Units into the skin 3 (three) times daily with meals.)   insulin glargine (LANTUS) 100 UNIT/ML injection Inject 0.25 mLs (25 Units total) into the skin at bedtime.   isosorbide mononitrate (IMDUR) 60 MG 24 hr tablet Take 60 mg by mouth daily.    loratadine (CLARITIN) 10 MG tablet Take 10 mg by mouth daily.   metoprolol tartrate (LOPRESSOR) 25 MG tablet Take 25 mg by mouth 2 (two) times daily.   nitroGLYCERIN (NITROSTAT) 0.4 MG SL tablet DISSOLVE 1 TABLET UNDER THE TONGUE EVERY 5 MINUTES AS NEEDED FOR CHEST PAIN  potassium chloride SA (K-DUR,KLOR-CON) 20 MEQ tablet Take 1 tablet (20 mEq total) by mouth daily.   ranolazine (RANEXA) 1000 MG SR tablet Take 1,000 mg by mouth 2 (two) times daily.   rosuvastatin (CRESTOR) 20 MG tablet Take 1 tablet (20 mg total) by mouth daily at 6 PM.   topiramate (TOPAMAX) 50 MG tablet Take 1 tablet (50 mg total) by mouth at bedtime. For headache prevention.    Travoprost, BAK Free, (TRAVATAN) 0.004 % SOLN ophthalmic solution Place 1 drop into both eyes at bedtime.    triamcinolone ointment (KENALOG) 0.1 % Apply 1 application topically 2 (two) times daily.   vitamin C (ASCORBIC ACID) 500 MG tablet Take 500 mg by mouth 2 (two) times daily.    No facility-administered encounter medications on file as of 10/10/2021.    Allergies (verified) Nitroglycerin, Statins, Ezetimibe, Fluvastatin, Fluvastatin sodium, Other, Rosuvastatin, Simvastatin, Lipitor [atorvastatin], and Lisinopril   History: Past Medical History:  Diagnosis Date   Allergies    Bronchitis    PMH   CAD (coronary artery disease)    07/2007   Diabetes ([place])    Elevated lipids    Glaucoma    HBP (high blood pressure)    Headache    Myocardial infarction (HCC)    Neuropathy    Osteoarthritis    Skin cancer    Sleep apnea    CPAP   Wears glasses    Wears partial dentures    Past Surgical History:  Procedure Laterality Date   APPENDECTOMY     CARDIAC CATHETERIZATION N/A 06/11/2016   Procedure: Left Heart Cath and Coronary Angiography;  Surgeon: Corey Skains, MD;  Location: [place];  Service: Cardiovascular;  Laterality: N/A;   CARPAL TUNNEL RELEASE Left 04/24/2016   Procedure: CARPAL TUNNEL RELEASE;  Surgeon: Hessie Knows, MD;  Location: [facility];  Service: Orthopedics;  Laterality: Left;   CATARACT EXTRACTION W/ INTRAOCULAR LENS  IMPLANT, BILATERAL Bilateral    CATARACT EXTRACTION, BILATERAL     R eye 07/16/12, L eye 08/13/12 - with lens implant in both eyes   CORONARY ARTERY BYPASS GRAFT N/A 06/27/2016   Procedure: CORONARY ARTERY BYPASS GRAFTING times four using left internal mammary artery and right leg saphenous vein;  Surgeon: Ivin Poot, MD;  Location: [place];  Service: Open Heart Surgery;  Laterality: N/A;   EYE SURGERY Bilateral    JOINT REPLACEMENT     KNEE ARTHROPLASTY Left 02/11/2016   Procedure: COMPUTER ASSISTED TOTAL KNEE ARTHROPLASTY;  Surgeon:  Dereck Leep, MD;  Location: [facility];  Service: Orthopedics;  Laterality: Left;   KNEE ARTHROSCOPY Right    LEFT HEART CATH AND CORS/GRAFTS ANGIOGRAPHY Left 01/13/2017   Procedure: Left Heart Cath and Cors/Grafts Angiography;  Surgeon: Corey Skains, MD;  Location: [place] [facility];  Service: Cardiovascular;  Laterality: Left;   METATARSAL HEAD EXCISION Right 03/21/2020   Procedure: METATARSAL HEAD RESECTION RIGHT AND DEBRIDEMENT OF ULCER;  Surgeon: [name], DPM;  Location: [facility];  Service: Podiatry;  Laterality: Right;   MULTIPLE TOOTH EXTRACTIONS     TEE WITHOUT CARDIOVERSION N/A 06/27/2016   Procedure: TRANSESOPHAGEAL ECHOCARDIOGRAM (TEE);  Surgeon: Ivin Poot, MD;  Location: [facility];  Service: Open Heart Surgery;  Laterality: N/A;   Family History  Problem Relation Age of Onset   Drug abuse Brother    Hypertension Mother    Lupus Mother    Hypertension Father    Diabetes Father    Heart  attack Father 28       died in his 60s   Arthritis Maternal Grandmother    Arthritis Maternal Grandfather    Arthritis Paternal Grandmother    Arthritis Paternal Grandfather    Heart disease Brother    Social History   Socioeconomic History   Marital status: Married    Spouse name: Not on file   Number of children: Not on file   Years of education: Not on file   Highest education level: Not on file  Occupational History   Not on file  Tobacco Use   Smoking status: Former    Packs/day: 1.00    Years: 16.00    Pack years: 16.00    Types: Cigarettes    Start date: 11/03/1969    Quit date: 07/22/1986    Years since quitting: 35.2   Smokeless tobacco: Never  Vaping Use   Vaping Use: Never used  Substance and Sexual Activity   Alcohol use: Not Currently    Alcohol/week: 0.0 standard drinks   Drug use: No   Sexual activity: Never  Other Topics Concern   Not on file  Social History Narrative   Married.  Lives at home with wife.    Social Determinants of Health    Financial Resource Strain: Low Risk    Difficulty of Paying Living Expenses: Not hard at all  Food Insecurity: No Food Insecurity   Worried About Charity fundraiser in the Last Year: Never true   [place] in the Last Year: Never true  Transportation Needs: No Transportation Needs   Lack of Transportation (Medical): No   Lack of Transportation (Non-Medical): No  Physical Activity: Inactive   Days of Exercise per Week: 0 days   Minutes of Exercise per Session: 0 min  Stress: No Stress Concern Present   Feeling of Stress : Not at all  Social Connections: Moderately Integrated   Frequency of Communication with Friends and Family: More than three times a week   Frequency of Social Gatherings with Friends and Family: More than three times a week   Attends Religious Services: 1 to 4 times per year   Active Member of [employer] or Organizations: No   Attends Music therapist: Never   Marital Status: Married    Tobacco Counseling Counseling given: Not Answered   Clinical Intake:  Pre-visit preparation completed: Yes  Pain : No/denies pain     BMI - recorded: 46.63 Nutritional Status: BMI > 30  Obese Nutritional Risks: None Diabetes: Yes CBG done?: No (visitcompleted over the phone) Did pt. bring in CBG monitor from home?: No  How often do you need to have someone help you when you read instructions, pamphlets, or other written materials from your doctor or pharmacy?: 1 - Never  Diabetes:  Is the patient diabetic?  Yes  If diabetic, was a CBG obtained today?  No , visit completed over the phone.  Did the patient bring in their glucometer from home?  No , visit completed over the phone.  How often do you monitor your CBG's? 4-5 times per day.   Financial Strains and Diabetes Management:  Are you having any financial strains with the device, your supplies or your medication? No .  Does the patient want to be seen by Chronic Care Management for management  of their diabetes?  No  Would the patient like to be referred to a Nutritionist or for Diabetic Management?  No   Diabetic Exams:  Diabetic Eye Exam: Patient states exam completed in 2022, unsure of the exact date.   Diabetic Foot Exam:  Due, patient has an upcoming appointment with the PCP.   Interpreter Needed?: No  Information entered by :: Orrin Brigham LPN   Activities of Daily Living In your present state of health, do you have any difficulty performing the following activities: 10/10/2021 03/22/2021  Hearing? Y N  Vision? N Y  Comment - followed by eye dr  Difficulty concentrating or making decisions? N N  Walking or climbing stairs? N Y  Dressing or bathing? N Y  Doing errands, shopping? Y N  Comment patient recieves eye injections -  Preparing Food and eating ? N -  Using the Toilet? N -  In the past six months, have you accidently leaked urine? Y -  Do you have problems with loss of bowel control? N -  Managing your Medications? N -  Managing your Finances? N -  Housekeeping or managing your Housekeeping? N -  Some recent data might be hidden    Patient Care Team: Pleas Koch, NP as PCP - General (Internal Medicine)  Indicate any recent Medical Services you may have received from other than [facility] providers in the past year (date may be approximate).     Assessment:   This is a routine wellness examination for [place].  Hearing/Vision screen Hearing Screening - Comments:: Patient states having decrease in hearing with constant buzzing. See ENT  Vision Screening - Comments:: Last eye exam 2022, [place] also see Dr. Ronny Flurry for macular degeneration and glaucoma  Dietary issues and exercise activities discussed: Current Exercise Habits: The patient does not participate in regular exercise at present   Goals Addressed             This Visit's Progress    Patient Stated       Would like to continue to lose weight        Depression  Screen PHQ 2/9 Scores 10/10/2021 03/22/2021 04/25/2019 04/19/2018 07/15/2017 04/01/2017 12/12/2016  PHQ - 2 Score 0 1 0 2 2 2 2   PHQ- 9 Score - 6 - 9 6 10 10     Fall Risk Fall Risk  10/10/2021 07/11/2020 04/25/2019 04/19/2018 04/01/2017  Falls in the past year? 1 1 1  Yes Yes  Comment - - - - -  Number falls in past yr: 0 0 0 1 1  Injury with Fall? 1 - 0 No No  Risk Factor Category  - - - - -  Risk for fall due to : Impaired balance/gait - Other (Comment) - History of fall(s);Impaired mobility  Follow up Falls prevention discussed - Education provided - Education provided  Comment - - - - -    FALL RISK PREVENTION PERTAINING TO THE HOME:  Any stairs in or around the home? Yes  If so, are there any without handrails? No  Home free of loose throw rugs in walkways, pet beds, electrical cords, etc? Yes  Adequate lighting in your home to reduce risk of falls? Yes   ASSISTIVE DEVICES UTILIZED TO PREVENT FALLS:  Life alert? No  Use of a cane, walker or w/c? Yes , cane Grab bars in the bathroom? Yes  Shower chair or bench in shower? Yes  Elevated toilet seat or a handicapped toilet? Yes   TIMED UP AND GO:  Was the test performed? No , visit completed over the phone.    Cognitive Function: Normal cognitive status assessed by this  Nurse Health Advisor. No abnormalities found.   MMSE - Mini Mental State Exam 04/19/2018 09/05/2016  Orientation to time 5 5  Orientation to Place 5 5  Registration 3 3  Attention/ Calculation 0 0  Recall 3 3  Language- name 2 objects 0 0  Language- repeat 1 1  Language- follow 3 step command 3 3  Language- read & follow direction 0 0  Write a sentence 0 0  Copy design 0 0  Total score 20 20        Immunizations Immunization History  Administered Date(s) Administered   Fluad Quad(high Dose 65+) 07/11/2020   Influenza,inj,Quad PF,6+ Mos 09/18/2017, 07/19/2018   Moderna Sars-Covid-2 Vaccination 04/13/2020, 05/07/2020   Pneumococcal-Unspecified 11/04/2015     TDAP status: Due, Education has been provided regarding the importance of this vaccine. Advised may receive this vaccine at local pharmacy or Health Dept. Aware to provide a copy of the vaccination record if obtained from local pharmacy or Health Dept. Verbalized acceptance and understanding.  Flu Vaccine status: Due, Education has been provided regarding the importance of this vaccine. Advised may receive this vaccine at local pharmacy or Health Dept. Aware to provide a copy of the vaccination record if obtained from local pharmacy or Health Dept. Verbalized acceptance and understanding.  Pneumococcal vaccine status: Due, Education has been provided regarding the importance of this vaccine. Advised may receive this vaccine at local pharmacy or Health Dept. Aware to provide a copy of the vaccination record if obtained from local pharmacy or Health Dept. Verbalized acceptance and understanding.  Covid-19 vaccine status: Information provided on how to obtain vaccines.   Qualifies for Shingles Vaccine? Yes   Zostavax completed No   Shingrix Completed?: No.    Education has been provided regarding the importance of this vaccine. Patient has been advised to call insurance company to determine out of pocket expense if they have not yet received this vaccine. Advised may also receive vaccine at local pharmacy or Health Dept. Verbalized acceptance and understanding.  Screening Tests Health Maintenance  Topic Date Due   Pneumonia Vaccine 20+ Years old (1 - PCV) 12/26/1956   TETANUS/TDAP  10/19/2018   OPHTHALMOLOGY EXAM  02/02/2019   FOOT EXAM  03/10/2019   URINE MICROALBUMIN  04/21/2020   HEMOGLOBIN A1C  09/08/2020   COVID-19 Vaccine (4 - Booster for Moderna series) 01/09/2021   INFLUENZA VACCINE  06/03/2021   Fecal DNA (Cologuard)  10/23/2022   Hepatitis C Screening  Completed   Zoster Vaccines- Shingrix  Completed   HPV VACCINES  Aged Out    Health Maintenance  Health Maintenance Due   Topic Date Due   Pneumonia Vaccine 40+ Years old (1 - PCV) 12/26/1956   TETANUS/TDAP  10/19/2018   OPHTHALMOLOGY EXAM  02/02/2019   FOOT EXAM  03/10/2019   URINE MICROALBUMIN  04/21/2020   HEMOGLOBIN A1C  09/08/2020   COVID-19 Vaccine (4 - Booster for Moderna series) 01/09/2021   INFLUENZA VACCINE  06/03/2021    Colorectal cancer screening: Type of screening: Cologuard. Completed 10/24/19. Repeat every 3 years  Lung Cancer Screening: (Low Dose CT Chest recommended if Age 68-80 years, 30 pack-year currently smoking OR have quit w/in 15years.) does not qualify.     Additional Screening:  Hepatitis C Screening: does qualify;   Vision Screening: Recommended annual ophthalmology exams for early detection of glaucoma and other disorders of the eye. Is the patient up to date with their annual eye exam?  Yes  Who is the provider  or what is the name of the office in which the patient attends annual eye exams? Dr. Ronny Flurry   Dental Screening: Recommended annual dental exams for proper oral hygiene  Community Resource Referral / Chronic Care Management: CRR required this visit?  No   CCM required this visit?  No      Plan:     I have personally reviewed and noted the following in the patient's chart:   Medical and social history Use of alcohol, tobacco or illicit drugs  Current medications and supplements including opioid prescriptions. Patient is not currently taking opioid prescriptions. Functional ability and status Nutritional status Physical activity Advanced directives List of other physicians Hospitalizations, surgeries, and ER visits in previous 12 months Vitals Screenings to include cognitive, depression, and falls Referrals and appointments  In addition, I have reviewed and discussed with patient certain preventive protocols, quality metrics, and best practice recommendations. A written personalized care plan for preventive services as well as general preventive  health recommendations were provided to patient.    Due to this being a telephonic visit, the after visit summary with patients personalized plan was offered to patient via mail or my-chart.  Patient would like to access on my-chart.   Loma Messing, LPN   [id number]   Nurse Health Advisor  Nurse Notes: none

## 2021-10-10 ENCOUNTER — Ambulatory Visit (INDEPENDENT_AMBULATORY_CARE_PROVIDER_SITE_OTHER): Payer: Medicare Other

## 2021-10-10 VITALS — Ht 70.0 in | Wt 325.0 lb

## 2021-10-10 DIAGNOSIS — Z0000 Encounter for general adult medical examination without abnormal findings: Secondary | ICD-10-CM

## 2021-10-10 NOTE — Patient Instructions (Signed)
Lawrence [name] , Thank you for taking time to complete your Medicare Wellness Visit. I appreciate your ongoing commitment to your health goals. Please review the following plan we discussed and let me know if I can assist you in the future.   Screening recommendations/referrals: Colonoscopy: cologuard up to date, completed 10/24/19, due 10/23/22 Recommended yearly ophthalmology/optometry visit for glaucoma screening and checkup Recommended yearly dental visit for hygiene and checkup  Vaccinations: Influenza vaccine: Due-May obtain vaccine at our office or your local pharmacy. Pneumococcal vaccine: Due-May obtain vaccine at our office or your local pharmacy. Tdap vaccine: Due last completed 10/19/08-May obtain vaccine at your local pharmacy. Shingles vaccine: Discuss with your local pharmacy Covid-19: newest booster available at your local pharmacy  Advanced directives: Please bring a copy of Living Will and/or [place] for your chart.   Conditions/risks identified: see problem list  Next appointment: Follow up in one year for your annual wellness visit. 10/15/22 @ 2:45pm , this will be a telephone visit.   Preventive Care 72 Years and Older, Male Preventive care refers to lifestyle choices and visits with your health care provider that can promote health and wellness. What does preventive care include? A yearly physical exam. This is also called an annual well check. Dental exams once or twice a year. Routine eye exams. Ask your health care provider how often you should have your eyes checked. Personal lifestyle choices, including: Daily care of your teeth and gums. Regular physical activity. Eating a healthy diet. Avoiding tobacco and drug use. Limiting alcohol use. Practicing safe sex. Taking low doses of aspirin every day. Taking vitamin and mineral supplements as recommended by your health care provider. What happens during an annual well check? The services  and screenings done by your health care provider during your annual well check will depend on your age, overall health, lifestyle risk factors, and family history of disease. Counseling  Your health care provider may ask you questions about your: Alcohol use. Tobacco use. Drug use. Emotional well-being. Home and relationship well-being. Sexual activity. Eating habits. History of falls. Memory and ability to understand (cognition). Work and work Statistician. Screening  You may have the following tests or measurements: Height, weight, and BMI. Blood pressure. Lipid and cholesterol levels. These may be checked every 5 years, or more frequently if you are over 20 years old. Skin check. Lung cancer screening. You may have this screening every year starting at age 33 if you have a 30-pack-year history of smoking and currently smoke or have quit within the past 15 years. Fecal occult blood test (FOBT) of the stool. You may have this test every year starting at age 17. Flexible sigmoidoscopy or colonoscopy. You may have a sigmoidoscopy every 5 years or a colonoscopy every 10 years starting at age 20. Prostate cancer screening. Recommendations will vary depending on your family history and other risks. Hepatitis C blood test. Hepatitis B blood test. Sexually transmitted disease (STD) testing. Diabetes screening. This is done by checking your blood sugar (glucose) after you have not eaten for a while (fasting). You may have this done every 1-3 years. Abdominal aortic aneurysm (AAA) screening. You may need this if you are a current or former smoker. Osteoporosis. You may be screened starting at age 60 if you are at high risk. Talk with your health care provider about your test results, treatment options, and if necessary, the need for more tests. Vaccines  Your health care provider may recommend certain vaccines, such as:  Influenza vaccine. This is recommended every year. Tetanus, diphtheria,  and acellular pertussis (Tdap, Td) vaccine. You may need a Td booster every 10 years. Zoster vaccine. You may need this after age 18. Pneumococcal 13-valent conjugate (PCV13) vaccine. One dose is recommended after age 69. Pneumococcal polysaccharide (PPSV23) vaccine. One dose is recommended after age 70. Talk to your health care provider about which screenings and vaccines you need and how often you need them. This information is not intended to replace advice given to you by your health care provider. Make sure you discuss any questions you have with your health care provider. Document Released: 11/16/2015 Document Revised: 07/09/2016 Document Reviewed: 08/21/2015 Elsevier Interactive Patient Education  2017 [place] Prevention in the Home Falls can cause injuries. They can happen to people of all ages. There are many things you can do to make your home safe and to help prevent falls. What can I do on the outside of my home? Regularly fix the edges of walkways and driveways and fix any cracks. Remove anything that might make you trip as you walk through a door, such as a raised step or threshold. Trim any bushes or trees on the path to your home. Use bright outdoor lighting. Clear any walking paths of anything that might make someone trip, such as rocks or tools. Regularly check to see if handrails are loose or broken. Make sure that both sides of any steps have handrails. Any raised decks and porches should have guardrails on the edges. Have any leaves, snow, or ice cleared regularly. Use sand or salt on walking paths during winter. Clean up any spills in your garage right away. This includes oil or grease spills. What can I do in the bathroom? Use night lights. Install grab bars by the toilet and in the tub and shower. Do not use towel bars as grab bars. Use non-skid mats or decals in the tub or shower. If you need to sit down in the shower, use a plastic, non-slip  stool. Keep the floor dry. Clean up any water that spills on the floor as soon as it happens. Remove soap buildup in the tub or shower regularly. Attach bath mats securely with double-sided non-slip rug tape. Do not have throw rugs and other things on the floor that can make you trip. What can I do in the bedroom? Use night lights. Make sure that you have a light by your bed that is easy to reach. Do not use any sheets or blankets that are too big for your bed. They should not hang down onto the floor. Have a firm chair that has side arms. You can use this for support while you get dressed. Do not have throw rugs and other things on the floor that can make you trip. What can I do in the kitchen? Clean up any spills right away. Avoid walking on wet floors. Keep items that you use a lot in easy-to-reach places. If you need to reach something above you, use a strong step stool that has a grab bar. Keep electrical cords out of the way. Do not use floor polish or wax that makes floors slippery. If you must use wax, use non-skid floor wax. Do not have throw rugs and other things on the floor that can make you trip. What can I do with my stairs? Do not leave any items on the stairs. Make sure that there are handrails on both sides of the stairs and use them.  Fix handrails that are broken or loose. Make sure that handrails are as long as the stairways. Check any carpeting to make sure that it is firmly attached to the stairs. Fix any carpet that is loose or worn. Avoid having throw rugs at the top or bottom of the stairs. If you do have throw rugs, attach them to the floor with carpet tape. Make sure that you have a light switch at the top of the stairs and the bottom of the stairs. If you do not have them, ask someone to add them for you. What else can I do to help prevent falls? Wear shoes that: Do not have high heels. Have rubber bottoms. Are comfortable and fit you well. Are closed at the  toe. Do not wear sandals. If you use a stepladder: Make sure that it is fully opened. Do not climb a closed stepladder. Make sure that both sides of the stepladder are locked into place. Ask someone to hold it for you, if possible. Clearly mark and make sure that you can see: Any grab bars or handrails. First and last steps. Where the edge of each step is. Use tools that help you move around (mobility aids) if they are needed. These include: Canes. Walkers. Scooters. Crutches. Turn on the lights when you go into a dark area. Replace any light bulbs as soon as they burn out. Set up your furniture so you have a clear path. Avoid moving your furniture around. If any of your floors are uneven, fix them. If there are any pets around you, be aware of where they are. Review your medicines with your doctor. Some medicines can make you feel dizzy. This can increase your chance of falling. Ask your doctor what other things that you can do to help prevent falls. This information is not intended to replace advice given to you by your health care provider. Make sure you discuss any questions you have with your health care provider. Document Released: 08/16/2009 Document Revised: 03/27/2016 Document Reviewed: 11/24/2014 Elsevier Interactive Patient Education  2017 [employer].

## 2021-11-05 ENCOUNTER — Ambulatory Visit: Payer: Medicare Other | Admitting: Primary Care

## 2021-11-12 ENCOUNTER — Other Ambulatory Visit: Payer: Self-pay

## 2021-11-12 ENCOUNTER — Ambulatory Visit (INDEPENDENT_AMBULATORY_CARE_PROVIDER_SITE_OTHER): Payer: Medicare Other | Admitting: Podiatry

## 2021-11-12 ENCOUNTER — Encounter: Payer: Self-pay | Admitting: Podiatry

## 2021-11-12 DIAGNOSIS — R42 Dizziness and giddiness: Secondary | ICD-10-CM | POA: Diagnosis not present

## 2021-11-12 DIAGNOSIS — G43809 Other migraine, not intractable, without status migrainosus: Secondary | ICD-10-CM | POA: Diagnosis not present

## 2021-11-12 DIAGNOSIS — G4733 Obstructive sleep apnea (adult) (pediatric): Secondary | ICD-10-CM | POA: Diagnosis not present

## 2021-11-12 DIAGNOSIS — E08621 Diabetes mellitus due to underlying condition with foot ulcer: Secondary | ICD-10-CM

## 2021-11-12 DIAGNOSIS — Z9989 Dependence on other enabling machines and devices: Secondary | ICD-10-CM | POA: Diagnosis not present

## 2021-11-12 DIAGNOSIS — L539 Erythematous condition, unspecified: Secondary | ICD-10-CM | POA: Diagnosis not present

## 2021-11-12 DIAGNOSIS — R251 Tremor, unspecified: Secondary | ICD-10-CM | POA: Diagnosis not present

## 2021-11-12 DIAGNOSIS — L97512 Non-pressure chronic ulcer of other part of right foot with fat layer exposed: Secondary | ICD-10-CM

## 2021-11-12 MED ORDER — DOXYCYCLINE HYCLATE 100 MG PO TABS: 100.0000 mg | ORAL_TABLET | Freq: Two times a day (BID) | ORAL | 0 refills | Status: AC

## 2021-11-12 NOTE — Progress Notes (Signed)
Subjective:  Patient ID: Lawrence [name], male    DOB: [birth year],  MRN: [id number]  Chief Complaint  Patient presents with   Toe Pain    Right hallux toe check     71 y.o. male presents for wound care.  Patient presents with right hallux ulceration with fat layer exposed.  Patient states been gone for quite some time has progressed to gotten worse.  He he states that it is painful to walk on painful to touch.  He is known to Dr. Amalia Hailey.  It has been causing some bleeding.  He has not been doing anything beside keeping covered he is here with his wife today.  He wanted to get it evaluated.  There is some redness associated with it as well.   Review of Systems: Negative except as noted in the HPI. Denies N/V/F/Ch.  Past Medical History:  Diagnosis Date   Allergies    Bronchitis    PMH   CAD (coronary artery disease)    07/2007   Diabetes (HCC)    Elevated lipids    Glaucoma    HBP (high blood pressure)    Headache    Myocardial infarction (HCC)    Neuropathy    Osteoarthritis    Skin cancer    Sleep apnea    CPAP   Wears glasses    Wears partial dentures     Current Outpatient Medications:    doxycycline (VIBRA-TABS) 100 MG tablet, Take 1 tablet (100 mg total) by mouth 2 (two) times daily for 14 days., Disp: 28 tablet, Rfl: 0   aspirin 81 MG EC tablet, Take 81 mg by mouth daily. Swallow whole., Disp: , Rfl:    cholecalciferol (VITAMIN D) 1000 units tablet, Take 2,000 Units by mouth in the morning and at bedtime. , Disp: , Rfl:    clopidogrel (PLAVIX) 75 MG tablet, Take 1 tablet (75 mg total) by mouth daily., Disp: 30 tablet, Rfl: 0   famotidine (PEPCID) 20 MG tablet, Take 1 tablet (20 mg total) by mouth daily., Disp: 30 tablet, Rfl: 0   furosemide (LASIX) 40 MG tablet, Take 1 tablet (40 mg total) by mouth daily., Disp: 30 tablet, Rfl: 1   glucagon 1 MG injection, Inject 1 mg into the vein once as needed. Reported on 02/11/2016, Disp: , Rfl:    GLUTOSE 15 40 % GEL, , Disp: ,  Rfl:    insulin aspart (NOVOLOG) 100 UNIT/ML injection, Inject 20 Units into the skin 3 (three) times daily. (Patient taking differently: Inject 15-20 Units into the skin 3 (three) times daily with meals.), Disp: 10 mL, Rfl: 5   insulin glargine (LANTUS) 100 UNIT/ML injection, Inject 0.25 mLs (25 Units total) into the skin at bedtime., Disp: 30 mL, Rfl: 1   isosorbide mononitrate (IMDUR) 60 MG 24 hr tablet, Take 60 mg by mouth daily. , Disp: , Rfl:    loratadine (CLARITIN) 10 MG tablet, Take 10 mg by mouth daily., Disp: , Rfl:    metoprolol tartrate (LOPRESSOR) 25 MG tablet, Take 25 mg by mouth 2 (two) times daily., Disp: , Rfl:    nitroGLYCERIN (NITROSTAT) 0.4 MG SL tablet, DISSOLVE 1 TABLET UNDER THE TONGUE EVERY 5 MINUTES AS NEEDED FOR CHEST PAIN, Disp: 30 tablet, Rfl: 0   potassium chloride SA (K-DUR,KLOR-CON) 20 MEQ tablet, Take 1 tablet (20 mEq total) by mouth daily., Disp: 30 tablet, Rfl: 1   ranolazine (RANEXA) 1000 MG SR tablet, Take 1,000 mg by mouth 2 (two) times daily.,  Disp: , Rfl:    rosuvastatin (CRESTOR) 20 MG tablet, Take 1 tablet (20 mg total) by mouth daily at 6 PM., Disp: 30 tablet, Rfl: 1   topiramate (TOPAMAX) 50 MG tablet, Take 1 tablet (50 mg total) by mouth at bedtime. For headache prevention., Disp: 90 tablet, Rfl: 0   Travoprost, BAK Free, (TRAVATAN) 0.004 % SOLN ophthalmic solution, Place 1 drop into both eyes at bedtime. , Disp: , Rfl:    triamcinolone ointment (KENALOG) 0.1 %, Apply 1 application topically 2 (two) times daily., Disp: , Rfl:    vitamin C (ASCORBIC ACID) 500 MG tablet, Take 500 mg by mouth 2 (two) times daily. , Disp: , Rfl:   Social History   Tobacco Use  Smoking Status Former   Packs/day: 1.00   Years: 16.00   Pack years: 16.00   Types: Cigarettes   Start date: 11/03/1969   Quit date: 07/22/1986   Years since quitting: 35.3  Smokeless Tobacco Never    Allergies  Allergen Reactions   Nitroglycerin Nausea Only and Other (See Comments)     Patches only - headache    Statins Other (See Comments)    BODY PAIN, Pt taking Crestor*      Ezetimibe Other (See Comments)   Fluvastatin     Other reaction(s): Muscle pain   Fluvastatin Sodium     Other reaction(s): Myalgias   Other Other (See Comments)   Rosuvastatin     Other reaction(s): Muscle pain, Myalgias   Simvastatin     Other reaction(s): Muscle pain, Myalgias   Lipitor [Atorvastatin] Other (See Comments) and Rash    BODY PAIN   Lisinopril Other (See Comments) and Nausea Only    Light headed, dizzy Other reaction(s): Dizziness   Objective:  There were no vitals filed for this visit. There is no height or weight on file to calculate BMI. Constitutional Well developed. Well nourished.  Vascular Dorsalis pedis pulses palpable bilaterally. Posterior tibial pulses palpable bilaterally. Capillary refill normal to all digits.  No cyanosis or clubbing noted. Pedal hair growth normal.  Neurologic Normal speech. Oriented to person, place, and time. Protective sensation absent  Dermatologic Wound Location: Right hallux ulceration with fat layer exposed.  Does not probe down to deep tissue/bone.  No malodor present.  Mild erythema noted.  No purulent drainage noted Wound Base: Mixed Granular/Fibrotic Peri-wound: Calloused Exudate: Scant/small amount Serosanguinous exudate Wound Measurements: -See below  Orthopedic: No pain to palpation either foot.   Radiographs: None Assessment:   1. 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 Erythema    Plan:  Patient was evaluated and treated and all questions answered.  Ulcer right hallux ulceration with fat layer exposed with underlying erythema -Debridement as below. -Dressed with Betadine wet-to-dry, DSD. -Continue off-loading with surgical shoe. -Doxycycline was dispensed for skin and soft tissue prophylaxis  Procedure:  Excisional Debridement of Wound Tool: Sharp chisel blade/tissue nipper Rationale: Removal of non-viable soft tissue from the wound to promote healing.  Anesthesia: none Pre-Debridement Wound Measurements: 1 cm x 0.7 cm x 0.3 cm Post-Debridement Wound Measurements: 1.2 cm x 0.8 cm x 0.3 cm  Type of Debridement: Sharp Excisional Tissue Removed: Non-viable soft tissue Blood loss: Minimal (<50cc) Depth of Debridement: subcutaneous tissue. Technique: Sharp excisional debridement to bleeding, viable wound base.  Wound Progress: This is my initial evaluation of continue to monitor the progression of the wound Site healing  conversation 7 Dressing: Dry, sterile, compression dressing. Disposition: Patient tolerated procedure well. Patient to return in 1 week for follow-up.  No follow-ups on file.

## 2021-11-26 ENCOUNTER — Ambulatory Visit (INDEPENDENT_AMBULATORY_CARE_PROVIDER_SITE_OTHER): Payer: Medicare Other | Admitting: Podiatry

## 2021-11-26 ENCOUNTER — Other Ambulatory Visit: Payer: Self-pay

## 2021-11-26 ENCOUNTER — Encounter: Payer: Self-pay | Admitting: Podiatry

## 2021-11-26 DIAGNOSIS — E08621 Diabetes mellitus due to underlying condition with foot ulcer: Secondary | ICD-10-CM

## 2021-11-26 DIAGNOSIS — L97512 Non-pressure chronic ulcer of other part of right foot with fat layer exposed: Secondary | ICD-10-CM | POA: Diagnosis not present

## 2021-11-29 ENCOUNTER — Ambulatory Visit: Payer: Medicare Other | Admitting: Podiatry

## 2021-12-02 DIAGNOSIS — H353221 Exudative age-related macular degeneration, left eye, with active choroidal neovascularization: Secondary | ICD-10-CM | POA: Diagnosis not present

## 2021-12-03 ENCOUNTER — Encounter: Payer: Self-pay | Admitting: Podiatry

## 2021-12-03 DIAGNOSIS — H401134 Primary open-angle glaucoma, bilateral, indeterminate stage: Secondary | ICD-10-CM | POA: Diagnosis not present

## 2021-12-03 NOTE — Progress Notes (Signed)
Subjective:  Patient ID: Lawrence [name], male    DOB: 11/11/1950,  MRN: [id number]  Chief Complaint  Patient presents with   Diabetic Ulcer    71 y.o. male presents for wound care.  Patient presents for follow-up of right hallux ulceration [place] exposed.  Patient states that it is looking a little bit better.  Has been doing Betadine wet-to-dry dressing changes.  He denies any other acute complaints.  He is here for evaluation   Review of Systems: Negative except as noted in the HPI. Denies N/V/F/Ch.  Past Medical History:  Diagnosis Date   Allergies    Bronchitis    PMH   CAD (coronary artery disease)    07/2007   Diabetes (HCC)    Elevated lipids    Glaucoma    HBP (high blood pressure)    Headache    Myocardial infarction (HCC)    Neuropathy    Osteoarthritis    Skin cancer    Sleep apnea    CPAP   Wears glasses    Wears partial dentures     Current Outpatient Medications:    aspirin 81 MG EC tablet, Take 81 mg by mouth daily. Swallow whole., Disp: , Rfl:    cholecalciferol (VITAMIN D) 1000 units tablet, Take 2,000 Units by mouth in the morning and at bedtime. , Disp: , Rfl:    clopidogrel (PLAVIX) 75 MG tablet, Take 1 tablet (75 mg total) by mouth daily., Disp: 30 tablet, Rfl: 0   famotidine (PEPCID) 20 MG tablet, Take 1 tablet (20 mg total) by mouth daily., Disp: 30 tablet, Rfl: 0   furosemide (LASIX) 40 MG tablet, Take 1 tablet (40 mg total) by mouth daily., Disp: 30 tablet, Rfl: 1   glucagon 1 MG injection, Inject 1 mg into the vein once as needed. Reported on 02/11/2016, Disp: , Rfl:    GLUTOSE 15 40 % GEL, , Disp: , Rfl:    insulin aspart (NOVOLOG) 100 UNIT/ML injection, Inject 20 Units into the skin 3 (three) times daily. (Patient taking differently: Inject 15-20 Units into the skin 3 (three) times daily with meals.), Disp: 10 mL, Rfl: 5   insulin glargine (LANTUS) 100 UNIT/ML injection, Inject 0.25 mLs (25 Units total) into the skin at bedtime., Disp: 30 mL,  Rfl: 1   isosorbide mononitrate (IMDUR) 60 MG 24 hr tablet, Take 60 mg by mouth daily. , Disp: , Rfl:    loratadine (CLARITIN) 10 MG tablet, Take 10 mg by mouth daily., Disp: , Rfl:    metoprolol tartrate (LOPRESSOR) 25 MG tablet, Take 25 mg by mouth 2 (two) times daily., Disp: , Rfl:    nitroGLYCERIN (NITROSTAT) 0.4 MG SL tablet, DISSOLVE 1 TABLET UNDER THE TONGUE EVERY 5 MINUTES AS NEEDED FOR CHEST PAIN, Disp: 30 tablet, Rfl: 0   potassium chloride SA (K-DUR,KLOR-CON) 20 MEQ tablet, Take 1 tablet (20 mEq total) by mouth daily., Disp: 30 tablet, Rfl: 1   ranolazine (RANEXA) 1000 MG SR tablet, Take 1,000 mg by mouth 2 (two) times daily., Disp: , Rfl:    rosuvastatin (CRESTOR) 20 MG tablet, Take 1 tablet (20 mg total) by mouth daily at 6 PM., Disp: 30 tablet, Rfl: 1   topiramate (TOPAMAX) 50 MG tablet, Take 1 tablet (50 mg total) by mouth at bedtime. For headache prevention., Disp: 90 tablet, Rfl: 0   Travoprost, BAK Free, (TRAVATAN) 0.004 % SOLN ophthalmic solution, Place 1 drop into both eyes at bedtime. , Disp: , Rfl:  triamcinolone ointment (KENALOG) 0.1 %, Apply 1 application topically 2 (two) times daily., Disp: , Rfl:    vitamin C (ASCORBIC ACID) 500 MG tablet, Take 500 mg by mouth 2 (two) times daily. , Disp: , Rfl:   Social History   Tobacco Use  Smoking Status Former   Packs/day: 1.00   Years: 16.00   Pack years: 16.00   Types: Cigarettes   Start date: 11/03/1969   Quit date: 07/22/1986   Years since quitting: 35.3  Smokeless Tobacco Never    Allergies  Allergen Reactions   Nitroglycerin Nausea Only and Other (See Comments)    Patches only - headache    Statins Other (See Comments)    BODY PAIN, Pt taking Crestor*      Ezetimibe Other (See Comments)   Fluvastatin     Other reaction(s): Muscle pain   Fluvastatin Sodium     Other reaction(s): Myalgias   Other Other (See Comments)   Rosuvastatin     Other reaction(s): Muscle pain, Myalgias   Simvastatin     Other  reaction(s): Muscle pain, Myalgias   Lipitor [Atorvastatin] Other (See Comments) and Rash    BODY PAIN   Lisinopril Other (See Comments) and Nausea Only    Light headed, dizzy Other reaction(s): Dizziness   Objective:  There were no vitals filed for this visit. There is no height or weight on file to calculate BMI. Constitutional Well developed. Well nourished.  Vascular Dorsalis pedis pulses palpable bilaterally. Posterior tibial pulses palpable bilaterally. Capillary refill normal to all digits.  No cyanosis or clubbing noted. Pedal hair growth normal.  Neurologic Normal speech. Oriented to person, place, and time. Protective sensation absent  Dermatologic Wound Location: Right hallux ulceration with fat layer exposed.  Does not probe down to deep tissue/bone.  No malodor present.  Mild erythema noted.  No purulent drainage noted Wound Base: Mixed Granular/Fibrotic Peri-wound: Calloused Exudate: Scant/small amount Serosanguinous exudate Wound Measurements: -See below  Orthopedic: No pain to palpation either foot.   Radiographs: None Assessment:   No diagnosis found.  Plan:  Patient was evaluated and treated and all questions answered.  Ulcer right hallux ulceration with fat layer exposed with underlying erythema -Debridement as below. -Dressed with Betadine wet-to-dry, DSD. -Continue off-loading with surgical shoe. -Continue doxycycline was dispensed for skin and soft tissue prophylaxis  Procedure: Excisional Debridement of Wound Tool: Sharp chisel blade/tissue nipper Rationale: Removal of non-viable soft tissue from the wound to promote healing.  Anesthesia: none Pre-Debridement Wound Measurements: 0.9 cm x 0.6 cm x 0.3 cm Post-Debridement Wound Measurements: 1.0 cm x 0.7 cm x 0.3 cm  Type of Debridement: Sharp Excisional Tissue Removed: Non-viable soft tissue Blood loss: Minimal (<50cc) Depth of Debridement: subcutaneous tissue. Technique: Sharp excisional  debridement to bleeding, viable wound base.  Wound Progress: The wound is improving.  Drainage has decreased Dressing: Dry, sterile, compression dressing. Disposition: Patient tolerated procedure well. Patient to return in 1 week for follow-up.  No follow-ups on file.

## 2021-12-06 DIAGNOSIS — H401134 Primary open-angle glaucoma, bilateral, indeterminate stage: Secondary | ICD-10-CM | POA: Diagnosis not present

## 2021-12-17 ENCOUNTER — Ambulatory Visit (INDEPENDENT_AMBULATORY_CARE_PROVIDER_SITE_OTHER): Payer: Medicare Other | Admitting: Podiatry

## 2021-12-17 ENCOUNTER — Other Ambulatory Visit: Payer: Self-pay

## 2021-12-17 DIAGNOSIS — E08621 Diabetes mellitus due to underlying condition with foot ulcer: Secondary | ICD-10-CM

## 2021-12-17 DIAGNOSIS — L97512 Non-pressure chronic ulcer of other part of right foot with fat layer exposed: Secondary | ICD-10-CM | POA: Diagnosis not present

## 2021-12-17 MED ORDER — DOXYCYCLINE HYCLATE 100 MG PO TABS: 100.0000 mg | ORAL_TABLET | Freq: Two times a day (BID) | ORAL | 0 refills | Status: AC

## 2021-12-20 ENCOUNTER — Encounter: Payer: Self-pay | Admitting: Podiatry

## 2021-12-20 NOTE — Progress Notes (Signed)
Subjective:  Patient ID: Lawrence [name], male    DOB: 08/27/1951,  MRN: [id number]  Chief Complaint  Patient presents with   Foot Ulcer    Right hallux wound     71 y.o. male presents for wound care.  Patient presents for follow-up of right hallux ulceration Faille exposed.  Patient states been doing Betadine wet-to-dry dressing changes.  He denies any other acute complaints.  It appears to be stable.   Review of Systems: Negative except as noted in the HPI. Denies N/V/F/Ch.  Past Medical History:  Diagnosis Date   Allergies    Bronchitis    PMH   CAD (coronary artery disease)    07/2007   Diabetes (HCC)    Elevated lipids    Glaucoma    HBP (high blood pressure)    Headache    Myocardial infarction (HCC)    Neuropathy    Osteoarthritis    Skin cancer    Sleep apnea    CPAP   Wears glasses    Wears partial dentures     Current Outpatient Medications:    doxycycline (VIBRA-TABS) 100 MG tablet, Take 1 tablet (100 mg total) by mouth 2 (two) times daily., Disp: 60 tablet, Rfl: 0   aspirin 81 MG EC tablet, Take 81 mg by mouth daily. Swallow whole., Disp: , Rfl:    cholecalciferol (VITAMIN D) 1000 units tablet, Take 2,000 Units by mouth in the morning and at bedtime. , Disp: , Rfl:    clopidogrel (PLAVIX) 75 MG tablet, Take 1 tablet (75 mg total) by mouth daily., Disp: 30 tablet, Rfl: 0   famotidine (PEPCID) 20 MG tablet, Take 1 tablet (20 mg total) by mouth daily., Disp: 30 tablet, Rfl: 0   furosemide (LASIX) 40 MG tablet, Take 1 tablet (40 mg total) by mouth daily., Disp: 30 tablet, Rfl: 1   glucagon 1 MG injection, Inject 1 mg into the vein once as needed. Reported on 02/11/2016, Disp: , Rfl:    GLUTOSE 15 40 % GEL, , Disp: , Rfl:    insulin aspart (NOVOLOG) 100 UNIT/ML injection, Inject 20 Units into the skin 3 (three) times daily. (Patient taking differently: Inject 15-20 Units into the skin 3 (three) times daily with meals.), Disp: 10 mL, Rfl: 5   insulin glargine  (LANTUS) 100 UNIT/ML injection, Inject 0.25 mLs (25 Units total) into the skin at bedtime., Disp: 30 mL, Rfl: 1   isosorbide mononitrate (IMDUR) 60 MG 24 hr tablet, Take 60 mg by mouth daily. , Disp: , Rfl:    loratadine (CLARITIN) 10 MG tablet, Take 10 mg by mouth daily., Disp: , Rfl:    metoprolol tartrate (LOPRESSOR) 25 MG tablet, Take 25 mg by mouth 2 (two) times daily., Disp: , Rfl:    nitroGLYCERIN (NITROSTAT) 0.4 MG SL tablet, DISSOLVE 1 TABLET UNDER THE TONGUE EVERY 5 MINUTES AS NEEDED FOR CHEST PAIN, Disp: 30 tablet, Rfl: 0   potassium chloride SA (K-DUR,KLOR-CON) 20 MEQ tablet, Take 1 tablet (20 mEq total) by mouth daily., Disp: 30 tablet, Rfl: 1   ranolazine (RANEXA) 1000 MG SR tablet, Take 1,000 mg by mouth 2 (two) times daily., Disp: , Rfl:    rosuvastatin (CRESTOR) 20 MG tablet, Take 1 tablet (20 mg total) by mouth daily at 6 PM., Disp: 30 tablet, Rfl: 1   topiramate (TOPAMAX) 50 MG tablet, Take 1 tablet (50 mg total) by mouth at bedtime. For headache prevention., Disp: 90 tablet, Rfl: 0   Travoprost, BAK Free, (  TRAVATAN) 0.004 % SOLN ophthalmic solution, Place 1 drop into both eyes at bedtime. , Disp: , Rfl:    triamcinolone ointment (KENALOG) 0.1 %, Apply 1 application topically 2 (two) times daily., Disp: , Rfl:    vitamin C (ASCORBIC ACID) 500 MG tablet, Take 500 mg by mouth 2 (two) times daily. , Disp: , Rfl:   Social History   Tobacco Use  Smoking Status Former   Packs/day: 1.00   Years: 16.00   Pack years: 16.00   Types: Cigarettes   Start date: 11/03/1969   Quit date: 07/22/1986   Years since quitting: 35.4  Smokeless Tobacco Never    Allergies  Allergen Reactions   Nitroglycerin Nausea Only and Other (See Comments)    Patches only - headache    Statins Other (See Comments)    BODY PAIN, Pt taking Crestor*      Ezetimibe Other (See Comments)   Fluvastatin     Other reaction(s): Muscle pain   Fluvastatin Sodium     Other reaction(s): Myalgias   Other Other  (See Comments)   Rosuvastatin     Other reaction(s): Muscle pain, Myalgias   Simvastatin     Other reaction(s): Muscle pain, Myalgias   Lipitor [Atorvastatin] Other (See Comments) and Rash    BODY PAIN   Lisinopril Other (See Comments) and Nausea Only    Light headed, dizzy Other reaction(s): Dizziness   Objective:  There were no vitals filed for this visit. There is no height or weight on file to calculate BMI. Constitutional Well developed. Well nourished.  Vascular Dorsalis pedis pulses palpable bilaterally. Posterior tibial pulses palpable bilaterally. Capillary refill normal to all digits.  No cyanosis or clubbing noted. Pedal hair growth normal.  Neurologic Normal speech. Oriented to person, place, and time. Protective sensation absent  Dermatologic Wound Location: Right hallux ulceration with fat layer exposed.  Does not probe down to deep tissue/bone.  No malodor present.  Mild erythema noted.  No purulent drainage noted Wound Base: Mixed Granular/Fibrotic Peri-wound: Calloused Exudate: Scant/small amount Serosanguinous exudate Wound Measurements: -See below  Orthopedic: No pain to palpation either foot.   Radiographs: None Assessment:   1. Diabetic ulcer of toe of right foot associated with diabetes mellitus due to underlying condition, with fat layer exposed ([place])     Plan:  Patient was evaluated and treated and all questions answered.  Ulcer right hallux ulceration with fat layer exposed with underlying erythema -Debridement as below. -Dressed with Betadine wet-to-dry, DSD. -Continue off-loading with surgical shoe. -Continue doxycycline was dispensed for skin and soft tissue prophylaxis  Procedure: Excisional Debridement of Wound~stagnant Tool: Sharp chisel blade/tissue nipper Rationale: Removal of non-viable soft tissue from the wound to promote healing.  Anesthesia: none Pre-Debridement Wound Measurements: 0.9 cm x 0.6 cm x 0.3 cm Post-Debridement  Wound Measurements: 1.0 cm x 0.7 cm x 0.3 cm  Type of Debridement: Sharp Excisional Tissue Removed: Non-viable soft tissue Blood loss: Minimal (<50cc) Depth of Debridement: subcutaneous tissue. Technique: Sharp excisional debridement to bleeding, viable wound base.  Wound Progress: The wound is improving.  Drainage has decreased Dressing: Dry, sterile, compression dressing. Disposition: Patient tolerated procedure well. Patient to return in 1 week for follow-up.  No follow-ups on file.

## 2021-12-26 ENCOUNTER — Other Ambulatory Visit: Payer: Self-pay

## 2021-12-26 ENCOUNTER — Encounter: Payer: Self-pay | Admitting: Primary Care

## 2021-12-26 ENCOUNTER — Ambulatory Visit (INDEPENDENT_AMBULATORY_CARE_PROVIDER_SITE_OTHER): Payer: Medicare Other | Admitting: Primary Care

## 2021-12-26 VITALS — BP 132/64 | HR 64 | Temp 98.6°F | Ht 70.0 in | Wt 316.0 lb

## 2021-12-26 DIAGNOSIS — N18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, stage 3a: Secondary | ICD-10-CM | POA: Diagnosis not present

## 2021-12-26 DIAGNOSIS — R519 Headache, unspecified: Secondary | ICD-10-CM

## 2021-12-26 LAB — BASIC METABOLIC PANEL
BUN: 21 mg/dL (ref 6–23)
CO2: 34 mEq/L — ABNORMAL HIGH (ref 19–32)
Calcium: 9.3 mg/dL (ref 8.4–10.5)
Chloride: 101 mEq/L (ref 96–112)
Creatinine, Ser: 1.23 mg/dL (ref 0.40–1.50)
GFR: 58.45 mL/min — ABNORMAL LOW (ref >60.00)
Glucose, Bld: 244 mg/dL — ABNORMAL HIGH (ref 70–99)
Potassium: 4.2 mEq/L (ref 3.5–5.1)
Sodium: 138 mEq/L (ref 135–145)

## 2021-12-26 NOTE — Progress Notes (Signed)
Subjective:    Patient ID: Lawrence [name], male    DOB: April 15, 1951, 71 y.o.   MRN: [id number]  HPI  CLEMENT DENEAULT is a very pleasant 71 y.o. male with a significant medical history including CAD, hypertension, sleep apnea, GERD, type 1 diabetes, osteoarthritis, lower extremity edema, frequent headaches, chronic back pain who presents today discuss renal function.  He largely follows through the [place] for medical care, is trying to transition to this area.   Evaluated by Neurology in September 2022 for frequent headaches. He underwent MRI brain per ENT, evaluated by neurology and diagnosed with vestibular migraines. During this visit he was told that he had decreased renal function.   Follows with endocrinology through the [facility] for diabetes, endorses last A1C was in the "high 7'". He drinks plenty of water.   Over the last two years he was taking Aleve at times and Excedrine Migraine regularly. He was taking recurrent doses of Excedrin Migraine around the time his renal function was checked in September 2022. Now he denies recent use of Excedrin Migraine medication, Ibuprofen or other NSAID's.   He denies difficulty urinating, hematuria.  He continues with ongoing headaches, is now on Amovig 140 mg monthly per Neurology. The Topamax caused increased headaches.   Review of Systems  Respiratory:  Negative for shortness of breath.   Cardiovascular:  Negative for chest pain.  Genitourinary:  Negative for difficulty urinating.  Neurological:  Positive for headaches.        Past Medical History:  Diagnosis Date   Allergies    Bronchitis    PMH   CAD (coronary artery disease)    07/2007   Diabetes (HCC)    Elevated lipids    Glaucoma    HBP (high blood pressure)    Headache    Myocardial infarction (HCC)    Neuropathy    Osteoarthritis    Skin cancer    Sleep apnea    CPAP   Wears glasses    Wears partial dentures     Social History   Socioeconomic History   Marital  status: Married    Spouse name: Not on file   Number of children: Not on file   Years of education: Not on file   Highest education level: Not on file  Occupational History   Not on file  Tobacco Use   Smoking status: Former    Packs/day: 1.00    Years: 16.00    Pack years: 16.00    Types: Cigarettes    Start date: 11/03/1969    Quit date: 07/22/1986    Years since quitting: 35.4   Smokeless tobacco: Never  Vaping Use   Vaping Use: Never used  Substance and Sexual Activity   Alcohol use: Not Currently    Alcohol/week: 0.0 standard drinks   Drug use: No   Sexual activity: Never  Other Topics Concern   Not on file  Social History Narrative   Married.  Lives at home with wife.    Social Determinants of Health   Financial Resource Strain: Low Risk    Difficulty of Paying Living Expenses: Not hard at all  Food Insecurity: No Food Insecurity   Worried About Charity fundraiser in the Last Year: Never true   [place] in the Last Year: Never true  Transportation Needs: No Transportation Needs   Lack of Transportation (Medical): No   Lack of Transportation (Non-Medical): No  Physical Activity: Inactive   Days  of Exercise per Week: 0 days   Minutes of Exercise per Session: 0 min  Stress: No Stress Concern Present   Feeling of Stress : Not at all  Social Connections: Moderately Integrated   Frequency of Communication with Friends and Family: More than three times a week   Frequency of Social Gatherings with Friends and Family: More than three times a week   Attends Religious Services: 1 to 4 times per year   Active Member of [employer] or Organizations: No   Attends Archivist Meetings: Never   Marital Status: Married  Human resources officer Violence: Not At Risk   Fear of Current or Ex-Partner: No   Emotionally Abused: No   Physically Abused: No   Sexually Abused: No    Past Surgical History:  Procedure Laterality Date   APPENDECTOMY     CARDIAC CATHETERIZATION  N/A 06/11/2016   Procedure: Left Heart Cath and Coronary Angiography;  Surgeon: Corey Skains, MD;  Location: [place] [facility];  Service: Cardiovascular;  Laterality: N/A;   CARPAL TUNNEL RELEASE Left 04/24/2016   Procedure: CARPAL TUNNEL RELEASE;  Surgeon: Hessie Knows, MD;  Location: [facility];  Service: Orthopedics;  Laterality: Left;   CATARACT EXTRACTION W/ INTRAOCULAR LENS  IMPLANT, BILATERAL Bilateral    CATARACT EXTRACTION, BILATERAL     R eye 07/16/12, L eye 08/13/12 - with lens implant in both eyes   CORONARY ARTERY BYPASS GRAFT N/A 06/27/2016   Procedure: CORONARY ARTERY BYPASS GRAFTING times four using left internal mammary artery and right leg saphenous vein;  Surgeon: Ivin Poot, MD;  Location: [place];  Service: Open Heart Surgery;  Laterality: N/A;   EYE SURGERY Bilateral    JOINT REPLACEMENT     KNEE ARTHROPLASTY Left 02/11/2016   Procedure: COMPUTER ASSISTED TOTAL KNEE ARTHROPLASTY;  Surgeon: Dereck Leep, MD;  Location: [facility];  Service: Orthopedics;  Laterality: Left;   KNEE ARTHROSCOPY Right    LEFT HEART CATH AND CORS/GRAFTS ANGIOGRAPHY Left 01/13/2017   Procedure: Left Heart Cath and Cors/Grafts Angiography;  Surgeon: Corey Skains, MD;  Location: [place] [facility];  Service: Cardiovascular;  Laterality: Left;   METATARSAL HEAD EXCISION Right 03/21/2020   Procedure: METATARSAL HEAD RESECTION RIGHT AND DEBRIDEMENT OF ULCER;  Surgeon: [name], DPM;  Location: [place];  Service: Podiatry;  Laterality: Right;   MULTIPLE TOOTH EXTRACTIONS     TEE WITHOUT CARDIOVERSION N/A 06/27/2016   Procedure: TRANSESOPHAGEAL ECHOCARDIOGRAM (TEE);  Surgeon: Ivin Poot, MD;  Location: [place];  Service: Open Heart Surgery;  Laterality: N/A;    Family History  Problem Relation Age of Onset   Drug abuse Brother    Hypertension Mother    Lupus Mother    Hypertension Father    Diabetes Father    Heart attack Father 6       died in his 40s   Arthritis Maternal  Grandmother    Arthritis Maternal Grandfather    Arthritis Paternal Grandmother    Arthritis Paternal Grandfather    Heart disease Brother     Allergies  Allergen Reactions   Nitroglycerin Nausea Only and Other (See Comments)    Patches only - headache    Statins Other (See Comments)    BODY PAIN, Pt taking Crestor*      Ezetimibe Other (See Comments)   Fluvastatin     Other reaction(s): Muscle pain   Fluvastatin Sodium     Other reaction(s): Myalgias   Other Other (See  Comments)   Rosuvastatin     Other reaction(s): Muscle pain, Myalgias   Simvastatin     Other reaction(s): Muscle pain, Myalgias   Lipitor [Atorvastatin] Other (See Comments) and Rash    BODY PAIN   Lisinopril Other (See Comments) and Nausea Only    Light headed, dizzy Other reaction(s): Dizziness    Current Outpatient Medications on File Prior to Visit  Medication Sig Dispense Refill   AIMOVIG 140 MG/ML SOAJ Inject into the skin.     aspirin 81 MG EC tablet Take 81 mg by mouth daily. Swallow whole.     cholecalciferol (VITAMIN D) 1000 units tablet Take 2,000 Units by mouth in the morning and at bedtime.      clopidogrel (PLAVIX) 75 MG tablet Take 1 tablet (75 mg total) by mouth daily. 30 tablet 0   doxycycline (VIBRA-TABS) 100 MG tablet Take 1 tablet (100 mg total) by mouth 2 (two) times daily. 60 tablet 0   famotidine (PEPCID) 20 MG tablet Take 1 tablet (20 mg total) by mouth daily. 30 tablet 0   furosemide (LASIX) 40 MG tablet Take 1 tablet (40 mg total) by mouth daily. 30 tablet 1   glucagon 1 MG injection Inject 1 mg into the vein once as needed. Reported on 02/11/2016     GLUTOSE 15 40 % GEL      insulin aspart (NOVOLOG) 100 UNIT/ML injection Inject 20 Units into the skin 3 (three) times daily. (Patient taking differently: Inject 15-20 Units into the skin 3 (three) times daily with meals.) 10 mL 5   insulin glargine (LANTUS) 100 UNIT/ML injection Inject 0.25 mLs (25 Units total) into the skin at  bedtime. 30 mL 1   insulin glargine-yfgn (SEMGLEE) 100 UNIT/ML injection INJECT 20 UNITS UNDER SKIN AT BEDTIME --DISCARD BOTTLE 30 DAYS AFTER OPENING **REPLACES LANTUS**     isosorbide mononitrate (IMDUR) 60 MG 24 hr tablet Take 60 mg by mouth daily.      loratadine (CLARITIN) 10 MG tablet Take 10 mg by mouth daily.     metoprolol tartrate (LOPRESSOR) 25 MG tablet Take 25 mg by mouth 2 (two) times daily.     nitroGLYCERIN (NITROSTAT) 0.4 MG SL tablet DISSOLVE 1 TABLET UNDER THE TONGUE EVERY 5 MINUTES AS NEEDED FOR CHEST PAIN 30 tablet 0   nitroGLYCERIN (NITROSTAT) 0.4 MG SL tablet DISSOLVE ONE TABLET UNDER THE TONGUE EVERY 5 MINUTES AS NEEDED FOR CHEST PAIN. REPEAT EVERY 5 MINUTES UP TO 3 DOSES. IF NO RELIEF SEEK MEDICAL HELP.(DO NOT TAKE CIALIS, VIAGRA OR LEVITRA WHILE YOU ARE ON THIS MEDICATION ).     potassium chloride SA (K-DUR,KLOR-CON) 20 MEQ tablet Take 1 tablet (20 mEq total) by mouth daily. 30 tablet 1   ranolazine (RANEXA) 1000 MG SR tablet Take 1,000 mg by mouth 2 (two) times daily.     rosuvastatin (CRESTOR) 20 MG tablet Take 1 tablet (20 mg total) by mouth daily at 6 PM. 30 tablet 1   Travoprost, BAK Free, (TRAVATAN) 0.004 % SOLN ophthalmic solution Place 1 drop into both eyes at bedtime.      triamcinolone ointment (KENALOG) 0.1 % Apply 1 application topically 2 (two) times daily.     vitamin C (ASCORBIC ACID) 500 MG tablet Take 500 mg by mouth 2 (two) times daily.      No current facility-administered medications on file prior to visit.    BP 132/64    Pulse 64    Temp 98.6 F (37 C) (Oral)  Ht 5\' 10"  (1.778 m)    Wt (!) 316 lb (143.3 kg)    SpO2 99%    BMI 45.34 kg/m  Objective:   Physical Exam Cardiovascular:     Rate and Rhythm: Normal rate and regular rhythm.  Pulmonary:     Effort: Pulmonary effort is normal.     Breath sounds: Normal breath sounds. No wheezing or rales.  Musculoskeletal:     Cervical back: Neck supple.  Skin:    General: Skin is warm and dry.   Neurological:     Mental Status: He is alert and oriented to person, place, and time.          Assessment & Plan:      This visit occurred during the SARS-CoV-2 public health emergency.  Safety protocols were in place, including screening questions prior to the visit, additional usage of staff PPE, and extensive cleaning of exam room while observing appropriate contact time as indicated for disinfecting solutions.

## 2021-12-26 NOTE — Assessment & Plan Note (Signed)
Likely multifactorial, but further decline in September 2022 was likely secondary to recurrent NSAID use.  Other longstanding causes could be type 1 diabetes, CAD, hypertension.  He drinks plenty of water which was encouraged. No recent NSAID use, discouraged use of NSAIDs moving forward.  Repeat BMP pending.

## 2021-12-26 NOTE — Assessment & Plan Note (Signed)
Continued.  Evaluated by ENT and now neurology.  MRI brain from August 2022 reviewed.  Continue Aimovig 140 mg injected monthly.

## 2021-12-26 NOTE — Patient Instructions (Signed)
Stop by the lab prior to leaving today. I will notify you of your results once received.   Refrain from taking medications such as ibuprofen/Advil/Motrin, naproxen/Aleve, Excedrin Migraine.  It was a pleasure to see you today!

## 2022-01-07 ENCOUNTER — Other Ambulatory Visit: Payer: Self-pay

## 2022-01-07 ENCOUNTER — Encounter: Payer: Self-pay | Admitting: Podiatry

## 2022-01-07 ENCOUNTER — Ambulatory Visit (INDEPENDENT_AMBULATORY_CARE_PROVIDER_SITE_OTHER): Payer: Medicare Other | Admitting: Podiatry

## 2022-01-07 DIAGNOSIS — E08621 Diabetes mellitus due to underlying condition with foot ulcer: Secondary | ICD-10-CM | POA: Diagnosis not present

## 2022-01-07 DIAGNOSIS — L97512 Non-pressure chronic ulcer of other part of right foot with fat layer exposed: Secondary | ICD-10-CM | POA: Diagnosis not present

## 2022-01-07 NOTE — Progress Notes (Signed)
?Subjective:  ?Patient ID: Lawrence [name], male    DOB: [birth year],  MRN: [id number] ? ?Chief Complaint  ?Patient presents with  ? Foot Ulcer  ?  "It's doing better."  ? ? ?71 y.o. male presents for wound care.  Patient presents for follow-up of right hallux ulceration Faille exposed.  Patient states been doing Santyl wet-to-dry.  He denies any other acute complaints.  It appears to be stable. ? ? ?Review of Systems: Negative except as noted in the HPI. Denies N/V/F/Ch. ? ?Past Medical History:  ?Diagnosis Date  ? Allergies   ? Bronchitis   ? PMH  ? CAD (coronary artery disease)   ? 07/2007  ? Diabetes ([place])   ? Elevated lipids   ? Glaucoma   ? HBP (high blood pressure)   ? Headache   ? Myocardial infarction [facility])   ? Neuropathy   ? Osteoarthritis   ? Skin cancer   ? Sleep apnea   ? CPAP  ? Wears glasses   ? Wears partial dentures   ? ? ?Current Outpatient Medications:  ?  AIMOVIG 140 MG/ML SOAJ, Inject into the skin., Disp: , Rfl:  ?  aspirin 81 MG EC tablet, Take 81 mg by mouth daily. Swallow whole., Disp: , Rfl:  ?  cholecalciferol (VITAMIN D) 1000 units tablet, Take 2,000 Units by mouth in the morning and at bedtime. , Disp: , Rfl:  ?  clopidogrel (PLAVIX) 75 MG tablet, Take 1 tablet (75 mg total) by mouth daily., Disp: 30 tablet, Rfl: 0 ?  doxycycline (VIBRA-TABS) 100 MG tablet, Take 1 tablet (100 mg total) by mouth 2 (two) times daily., Disp: 60 tablet, Rfl: 0 ?  famotidine (PEPCID) 20 MG tablet, Take 1 tablet (20 mg total) by mouth daily., Disp: 30 tablet, Rfl: 0 ?  furosemide (LASIX) 40 MG tablet, Take 1 tablet (40 mg total) by mouth daily., Disp: 30 tablet, Rfl: 1 ?  glucagon 1 MG injection, Inject 1 mg into the vein once as needed. Reported on 02/11/2016, Disp: , Rfl:  ?  GLUTOSE 15 40 % GEL, , Disp: , Rfl:  ?  insulin aspart (NOVOLOG) 100 UNIT/ML injection, Inject 20 Units into the skin 3 (three) times daily. (Patient taking differently: Inject 15-20 Units into the skin 3 (three) times daily with meals.),  Disp: 10 mL, Rfl: 5 ?  insulin glargine (LANTUS) 100 UNIT/ML injection, Inject 0.25 mLs (25 Units total) into the skin at bedtime., Disp: 30 mL, Rfl: 1 ?  insulin glargine-yfgn (SEMGLEE) 100 UNIT/ML injection, INJECT 20 UNITS UNDER SKIN AT BEDTIME --DISCARD BOTTLE 30 DAYS AFTER OPENING **REPLACES LANTUS**, Disp: , Rfl:  ?  isosorbide mononitrate (IMDUR) 60 MG 24 hr tablet, Take 60 mg by mouth daily. , Disp: , Rfl:  ?  loratadine (CLARITIN) 10 MG tablet, Take 10 mg by mouth daily., Disp: , Rfl:  ?  metoprolol tartrate (LOPRESSOR) 25 MG tablet, Take 25 mg by mouth 2 (two) times daily., Disp: , Rfl:  ?  nitroGLYCERIN (NITROSTAT) 0.4 MG SL tablet, DISSOLVE 1 TABLET UNDER THE TONGUE EVERY 5 MINUTES AS NEEDED FOR CHEST PAIN, Disp: 30 tablet, Rfl: 0 ?  nitroGLYCERIN (NITROSTAT) 0.4 MG SL tablet, DISSOLVE ONE TABLET UNDER THE TONGUE EVERY 5 MINUTES AS NEEDED FOR CHEST PAIN. REPEAT EVERY 5 MINUTES UP TO 3 DOSES. IF NO RELIEF SEEK MEDICAL HELP.(DO NOT TAKE CIALIS, VIAGRA OR LEVITRA WHILE YOU ARE ON THIS MEDICATION )., Disp: , Rfl:  ?  potassium chloride SA (K-DUR,KLOR-CON) 20  MEQ tablet, Take 1 tablet (20 mEq total) by mouth daily., Disp: 30 tablet, Rfl: 1 ?  ranolazine (RANEXA) 1000 MG SR tablet, Take 1,000 mg by mouth 2 (two) times daily., Disp: , Rfl:  ?  rosuvastatin (CRESTOR) 20 MG tablet, Take 1 tablet (20 mg total) by mouth daily at 6 PM., Disp: 30 tablet, Rfl: 1 ?  Travoprost, BAK Free, (TRAVATAN) 0.004 % SOLN ophthalmic solution, Place 1 drop into both eyes at bedtime. , Disp: , Rfl:  ?  triamcinolone ointment (KENALOG) 0.1 %, Apply 1 application topically 2 (two) times daily., Disp: , Rfl:  ?  vitamin C (ASCORBIC ACID) 500 MG tablet, Take 500 mg by mouth 2 (two) times daily. , Disp: , Rfl:  ? ?Social History  ? ?Tobacco Use  ?Smoking Status Former  ? Packs/day: 1.00  ? Years: 16.00  ? Pack years: 16.00  ? Types: Cigarettes  ? Start date: 11/03/1969  ? Quit date: 07/22/1986  ? Years since quitting: 35.4  ?Smokeless  Tobacco Never  ? ? ?Allergies  ?Allergen Reactions  ? Nitroglycerin Nausea Only and Other (See Comments)  ?  Patches only - headache ?  ? Statins Other (See Comments)  ?  BODY PAIN, Pt taking Crestor* ? ? ?  ? Ezetimibe Other (See Comments)  ? Fluvastatin   ?  Other reaction(s): Muscle pain  ? Fluvastatin Sodium   ?  Other reaction(s): Myalgias  ? Other Other (See Comments)  ? Rosuvastatin   ?  Other reaction(s): Muscle pain, Myalgias  ? Simvastatin   ?  Other reaction(s): Muscle pain, Myalgias  ? Lipitor [Atorvastatin] Other (See Comments) and Rash  ?  BODY PAIN  ? Lisinopril Other (See Comments) and Nausea Only  ?  Light headed, dizzy ?Other reaction(s): Dizziness  ? ?Objective:  ?There were no vitals filed for this visit. ?There is no height or weight on file to calculate BMI. ?Constitutional Well developed. ?Well nourished.  ?Vascular Dorsalis pedis pulses palpable bilaterally. ?Posterior tibial pulses palpable bilaterally. ?Capillary refill normal to all digits.  ?No cyanosis or clubbing noted. ?Pedal hair growth normal.  ?Neurologic Normal speech. ?Oriented to person, place, and time. ?Protective sensation absent  ?Dermatologic Wound Location: Right hallux ulceration with fat layer exposed.  Does not probe down to deep tissue/bone.  No malodor present.  Mild erythema noted.  No purulent drainage noted ?Wound Base: Mixed Granular/Fibrotic ?Peri-wound: Calloused ?Exudate: Scant/small amount Serosanguinous exudate ?Wound Measurements: ?-See below  ?Orthopedic: No pain to palpation either foot.  ? ?Radiographs: None ?Assessment:  ? ?1. Diabetic ulcer of toe of right foot associated with diabetes mellitus due to underlying condition, with fat layer exposed ([place])   ? ? ? ?Plan:  ?Patient was evaluated and treated and all questions answered. ? ?Ulcer right hallux ulceration with fat layer exposed with underlying erythema ?-Debridement as below. ?-Dressed with Santyl wet-to-dry ?-Continue off-loading with surgical  shoe. ?-Continue doxycycline was dispensed for skin and soft tissue prophylaxis ? ?Procedure: Excisional Debridement of Wound~stagnant ?Tool: Sharp chisel blade/tissue nipper ?Rationale: Removal of non-viable soft tissue from the wound to promote healing.  ?Anesthesia: none ?Pre-Debridement Wound Measurements: 0.5 cm x 0.5 cm x 0.3 cm ?Post-Debridement Wound Measurements: 0.7 cm x 0.6 cm x 0.3 cm  ?Type of Debridement: Sharp Excisional ?Tissue Removed: Non-viable soft tissue ?Blood loss: Minimal (<50cc) ?Depth of Debridement: subcutaneous tissue. ?Technique: Sharp excisional debridement to bleeding, viable wound base.  ?Wound Progress: The wound is improving. ?Decreased in size. ?Dressing: Dry, sterile,  compression dressing. ?Disposition: Patient tolerated procedure well. Patient to return in 1 week for follow-up. ? ?No follow-ups on file. ? ?  ?

## 2022-01-10 DIAGNOSIS — D2261 Melanocytic nevi of right upper limb, including shoulder: Secondary | ICD-10-CM | POA: Diagnosis not present

## 2022-01-10 DIAGNOSIS — I872 Venous insufficiency (chronic) (peripheral): Secondary | ICD-10-CM | POA: Diagnosis not present

## 2022-01-10 DIAGNOSIS — D225 Melanocytic nevi of trunk: Secondary | ICD-10-CM | POA: Diagnosis not present

## 2022-01-10 DIAGNOSIS — Z85828 Personal history of other malignant neoplasm of skin: Secondary | ICD-10-CM | POA: Diagnosis not present

## 2022-01-10 DIAGNOSIS — D485 Neoplasm of uncertain behavior of skin: Secondary | ICD-10-CM | POA: Diagnosis not present

## 2022-01-10 DIAGNOSIS — D045 Carcinoma in situ of skin of trunk: Secondary | ICD-10-CM | POA: Diagnosis not present

## 2022-01-10 DIAGNOSIS — D2272 Melanocytic nevi of left lower limb, including hip: Secondary | ICD-10-CM | POA: Diagnosis not present

## 2022-01-10 DIAGNOSIS — L821 Other seborrheic keratosis: Secondary | ICD-10-CM | POA: Diagnosis not present

## 2022-01-15 DIAGNOSIS — I6523 Occlusion and stenosis of bilateral carotid arteries: Secondary | ICD-10-CM | POA: Diagnosis not present

## 2022-01-15 DIAGNOSIS — E782 Mixed hyperlipidemia: Secondary | ICD-10-CM | POA: Diagnosis not present

## 2022-01-15 DIAGNOSIS — Z6841 Body Mass Index (BMI) 40.0 and over, adult: Secondary | ICD-10-CM | POA: Diagnosis not present

## 2022-01-15 DIAGNOSIS — E109 Type 1 diabetes mellitus without complications: Secondary | ICD-10-CM | POA: Diagnosis not present

## 2022-01-15 DIAGNOSIS — I10 Essential (primary) hypertension: Secondary | ICD-10-CM | POA: Diagnosis not present

## 2022-01-15 DIAGNOSIS — N18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, stage 3a: Secondary | ICD-10-CM | POA: Diagnosis not present

## 2022-01-15 DIAGNOSIS — I25708 Atherosclerosis of coronary artery bypass graft(s), unspecified, with other forms of angina pectoris: Secondary | ICD-10-CM | POA: Diagnosis not present

## 2022-01-15 DIAGNOSIS — G4733 Obstructive sleep apnea (adult) (pediatric): Secondary | ICD-10-CM | POA: Diagnosis not present

## 2022-01-21 DIAGNOSIS — D045 Carcinoma in situ of skin of trunk: Secondary | ICD-10-CM | POA: Diagnosis not present

## 2022-02-03 DIAGNOSIS — H353221 Exudative age-related macular degeneration, left eye, with active choroidal neovascularization: Secondary | ICD-10-CM | POA: Diagnosis not present

## 2022-02-04 ENCOUNTER — Ambulatory Visit (INDEPENDENT_AMBULATORY_CARE_PROVIDER_SITE_OTHER): Payer: Medicare Other | Admitting: Podiatry

## 2022-02-04 DIAGNOSIS — E08621 Diabetes mellitus due to underlying condition with foot ulcer: Secondary | ICD-10-CM

## 2022-02-04 DIAGNOSIS — L97512 Non-pressure chronic ulcer of other part of right foot with fat layer exposed: Secondary | ICD-10-CM | POA: Diagnosis not present

## 2022-02-06 ENCOUNTER — Encounter: Payer: Self-pay | Admitting: Podiatry

## 2022-02-06 NOTE — Progress Notes (Signed)
?Subjective:  ?Patient ID: Lawrence [name], male    DOB: 1951/05/14,  MRN: [id number] ? ?Chief Complaint  ?Patient presents with  ? Diabetic Ulcer  ? ? ?71 y.o. male presents for wound care.  Patient presents for follow-up of right hallux ulceration Faille exposed.  Patient states been doing Santyl wet-to-dry.  He denies any other acute complaints.  It appears to be stable. ? ? ?Review of Systems: Negative except as noted in the HPI. Denies N/V/F/Ch. ? ?Past Medical History:  ?Diagnosis Date  ? Allergies   ? Bronchitis   ? PMH  ? CAD (coronary artery disease)   ? 07/2007  ? Diabetes ([place])   ? Elevated lipids   ? Glaucoma   ? HBP (high blood pressure)   ? Headache   ? Myocardial infarction [facility])   ? Neuropathy   ? Osteoarthritis   ? Skin cancer   ? Sleep apnea   ? CPAP  ? Wears glasses   ? Wears partial dentures   ? ? ?Current Outpatient Medications:  ?  AIMOVIG 140 MG/ML SOAJ, Inject into the skin., Disp: , Rfl:  ?  aspirin 81 MG EC tablet, Take 81 mg by mouth daily. Swallow whole., Disp: , Rfl:  ?  cholecalciferol (VITAMIN D) 1000 units tablet, Take 2,000 Units by mouth in the morning and at bedtime. , Disp: , Rfl:  ?  clopidogrel (PLAVIX) 75 MG tablet, Take 1 tablet (75 mg total) by mouth daily., Disp: 30 tablet, Rfl: 0 ?  famotidine (PEPCID) 20 MG tablet, Take 1 tablet (20 mg total) by mouth daily., Disp: 30 tablet, Rfl: 0 ?  furosemide (LASIX) 40 MG tablet, Take 1 tablet (40 mg total) by mouth daily., Disp: 30 tablet, Rfl: 1 ?  glucagon 1 MG injection, Inject 1 mg into the vein once as needed. Reported on 02/11/2016, Disp: , Rfl:  ?  GLUTOSE 15 40 % GEL, , Disp: , Rfl:  ?  insulin aspart (NOVOLOG) 100 UNIT/ML injection, Inject 20 Units into the skin 3 (three) times daily. (Patient taking differently: Inject 15-20 Units into the skin 3 (three) times daily with meals.), Disp: 10 mL, Rfl: 5 ?  insulin glargine (LANTUS) 100 UNIT/ML injection, Inject 0.25 mLs (25 Units total) into the skin at bedtime., Disp: 30 mL,  Rfl: 1 ?  insulin glargine-yfgn (SEMGLEE) 100 UNIT/ML injection, INJECT 20 UNITS UNDER SKIN AT BEDTIME --DISCARD BOTTLE 30 DAYS AFTER OPENING **REPLACES LANTUS**, Disp: , Rfl:  ?  isosorbide mononitrate (IMDUR) 60 MG 24 hr tablet, Take 60 mg by mouth daily. , Disp: , Rfl:  ?  loratadine (CLARITIN) 10 MG tablet, Take 10 mg by mouth daily., Disp: , Rfl:  ?  metoprolol tartrate (LOPRESSOR) 25 MG tablet, Take 25 mg by mouth 2 (two) times daily., Disp: , Rfl:  ?  nitroGLYCERIN (NITROSTAT) 0.4 MG SL tablet, DISSOLVE 1 TABLET UNDER THE TONGUE EVERY 5 MINUTES AS NEEDED FOR CHEST PAIN, Disp: 30 tablet, Rfl: 0 ?  nitroGLYCERIN (NITROSTAT) 0.4 MG SL tablet, DISSOLVE ONE TABLET UNDER THE TONGUE EVERY 5 MINUTES AS NEEDED FOR CHEST PAIN. REPEAT EVERY 5 MINUTES UP TO 3 DOSES. IF NO RELIEF SEEK MEDICAL HELP.(DO NOT TAKE CIALIS, VIAGRA OR LEVITRA WHILE YOU ARE ON THIS MEDICATION )., Disp: , Rfl:  ?  potassium chloride SA (K-DUR,KLOR-CON) 20 MEQ tablet, Take 1 tablet (20 mEq total) by mouth daily., Disp: 30 tablet, Rfl: 1 ?  ranolazine (RANEXA) 1000 MG SR tablet, Take 1,000 mg by mouth 2 (  two) times daily., Disp: , Rfl:  ?  rosuvastatin (CRESTOR) 20 MG tablet, Take 1 tablet (20 mg total) by mouth daily at 6 PM., Disp: 30 tablet, Rfl: 1 ?  Travoprost, BAK Free, (TRAVATAN) 0.004 % SOLN ophthalmic solution, Place 1 drop into both eyes at bedtime. , Disp: , Rfl:  ?  triamcinolone ointment (KENALOG) 0.1 %, Apply 1 application topically 2 (two) times daily., Disp: , Rfl:  ?  vitamin C (ASCORBIC ACID) 500 MG tablet, Take 500 mg by mouth 2 (two) times daily. , Disp: , Rfl:  ? ?Social History  ? ?Tobacco Use  ?Smoking Status Former  ? Packs/day: 1.00  ? Years: 16.00  ? Pack years: 16.00  ? Types: Cigarettes  ? Start date: 11/03/1969  ? Quit date: 07/22/1986  ? Years since quitting: 35.5  ?Smokeless Tobacco Never  ? ? ?Allergies  ?Allergen Reactions  ? Nitroglycerin Nausea Only and Other (See Comments)  ?  Patches only - headache ?  ? Statins  Other (See Comments)  ?  BODY PAIN, Pt taking Crestor* ? ? ?  ? Ezetimibe Other (See Comments)  ? Fluvastatin   ?  Other reaction(s): Muscle pain  ? Fluvastatin Sodium   ?  Other reaction(s): Myalgias  ? Other Other (See Comments)  ? Rosuvastatin   ?  Other reaction(s): Muscle pain, Myalgias  ? Simvastatin   ?  Other reaction(s): Muscle pain, Myalgias  ? Lipitor [Atorvastatin] Other (See Comments) and Rash  ?  BODY PAIN  ? Lisinopril Other (See Comments) and Nausea Only  ?  Light headed, dizzy ?Other reaction(s): Dizziness  ? ?Objective:  ?There were no vitals filed for this visit. ?There is no height or weight on file to calculate BMI. ?Constitutional Well developed. ?Well nourished.  ?Vascular Dorsalis pedis pulses palpable bilaterally. ?Posterior tibial pulses palpable bilaterally. ?Capillary refill normal to all digits.  ?No cyanosis or clubbing noted. ?Pedal hair growth normal.  ?Neurologic Normal speech. ?Oriented to person, place, and time. ?Protective sensation absent  ?Dermatologic Wound Location: Right hallux ulceration with fat layer exposed.  Does not probe down to deep tissue/bone.  No malodor present.  No further erythema noted.  No purulent drainage noted ?Wound Base: Mixed Granular/Fibrotic ?Peri-wound: Calloused ?Exudate: Scant/small amount Serosanguinous exudate ?Wound Measurements: ?-See below  ?Orthopedic: No pain to palpation either foot.  ? ?Radiographs: None ?Assessment:  ? ?1. 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 ? ? ? ?Plan:  ?Patient was evaluated and treated and all questions answered. ? ?Ulcer right hallux ulceration with fat layer exposed  ?-Debridement as below. ?-Dressed with Santyl wet-to-dry ?-Continue off-loading with surgical shoe. ?-Continue doxycycline was dispensed for skin and soft tissue prophylaxis until completed ? ?Procedure: Excisional Debridement of Wound~stagnant ?Tool: Sharp  chisel blade/tissue nipper ?Rationale: Removal of non-viable soft tissue from the wound to promote healing.  ?Anesthesia: none ?Pre-Debridement Wound Measurements: 0.4 cm x 0.3 cm x 0.3 cm ?Post-Debridement Wound Measurements: 0.5 cm x 0.5 cm x 0.3 cm  ?Type of Debridement: Sharp Excisional ?Tissue Removed: Non-viable soft tissue ?Blood loss: Minimal (<50cc) ?Depth of Debridement: subcutaneous tissue. ?Technique: Sharp excisional debridement to bleeding, viable wound base.  ?Wound Progress: The wound is improving. ?Decreased in size. ?Dressing: Dry, sterile, compression dressing. ?Disposition: Patient tolerated procedure well. Patient to return in 1 week for follow-up. ? ?No follow-ups on file. ? ?  ?

## 2022-02-20 DIAGNOSIS — Z20822 Contact with and (suspected) exposure to covid-19: Secondary | ICD-10-CM | POA: Diagnosis not present

## 2022-02-25 ENCOUNTER — Other Ambulatory Visit: Payer: Self-pay | Admitting: Podiatry

## 2022-02-25 ENCOUNTER — Ambulatory Visit (INDEPENDENT_AMBULATORY_CARE_PROVIDER_SITE_OTHER): Payer: Medicare Other | Admitting: Podiatry

## 2022-02-25 DIAGNOSIS — L97512 Non-pressure chronic ulcer of other part of right foot with fat layer exposed: Secondary | ICD-10-CM | POA: Diagnosis not present

## 2022-02-25 DIAGNOSIS — E08621 Diabetes mellitus due to underlying condition with foot ulcer: Secondary | ICD-10-CM | POA: Diagnosis not present

## 2022-02-25 NOTE — Progress Notes (Signed)
?Subjective:  ?Patient ID: Lawrence [name], male    DOB: [birth year],  MRN: [id number] ? ?Chief Complaint  ?Patient presents with  ? Wound Check  ? ? ?71 y.o. male presents for wound care.  Patient presents for follow-up of right hallux ulceration Faille exposed.  The wound appears to be about the same and has reached a point of stagnation.  At this time patient has failed Betadine as well as all the other local wound care modality.  I believe patient will benefit from graft application.  I discussed with the patient he would like to proceed with that.  He denies smoking ? ? ?Review of Systems: Negative except as noted in the HPI. Denies N/V/F/Ch. ? ?Past Medical History:  ?Diagnosis Date  ? Allergies   ? Bronchitis   ? PMH  ? CAD (coronary artery disease)   ? 07/2007  ? Diabetes ([place])   ? Elevated lipids   ? Glaucoma   ? HBP (high blood pressure)   ? Headache   ? Myocardial infarction [facility])   ? Neuropathy   ? Osteoarthritis   ? Skin cancer   ? Sleep apnea   ? CPAP  ? Wears glasses   ? Wears partial dentures   ? ? ?Current Outpatient Medications:  ?  AIMOVIG 140 MG/ML SOAJ, Inject into the skin., Disp: , Rfl:  ?  aspirin 81 MG EC tablet, Take 81 mg by mouth daily. Swallow whole., Disp: , Rfl:  ?  cholecalciferol (VITAMIN D) 1000 units tablet, Take 2,000 Units by mouth in the morning and at bedtime. , Disp: , Rfl:  ?  clopidogrel (PLAVIX) 75 MG tablet, Take 1 tablet (75 mg total) by mouth daily., Disp: 30 tablet, Rfl: 0 ?  famotidine (PEPCID) 20 MG tablet, Take 1 tablet (20 mg total) by mouth daily., Disp: 30 tablet, Rfl: 0 ?  furosemide (LASIX) 40 MG tablet, Take 1 tablet (40 mg total) by mouth daily., Disp: 30 tablet, Rfl: 1 ?  glucagon 1 MG injection, Inject 1 mg into the vein once as needed. Reported on 02/11/2016, Disp: , Rfl:  ?  GLUTOSE 15 40 % GEL, , Disp: , Rfl:  ?  insulin aspart (NOVOLOG) 100 UNIT/ML injection, Inject 20 Units into the skin 3 (three) times daily. (Patient taking differently: Inject 15-20  Units into the skin 3 (three) times daily with meals.), Disp: 10 mL, Rfl: 5 ?  insulin glargine (LANTUS) 100 UNIT/ML injection, Inject 0.25 mLs (25 Units total) into the skin at bedtime., Disp: 30 mL, Rfl: 1 ?  insulin glargine-yfgn (SEMGLEE) 100 UNIT/ML injection, INJECT 20 UNITS UNDER SKIN AT BEDTIME --DISCARD BOTTLE 30 DAYS AFTER OPENING **REPLACES LANTUS**, Disp: , Rfl:  ?  isosorbide mononitrate (IMDUR) 60 MG 24 hr tablet, Take 60 mg by mouth daily. , Disp: , Rfl:  ?  loratadine (CLARITIN) 10 MG tablet, Take 10 mg by mouth daily., Disp: , Rfl:  ?  metoprolol tartrate (LOPRESSOR) 25 MG tablet, Take 25 mg by mouth 2 (two) times daily., Disp: , Rfl:  ?  nitroGLYCERIN (NITROSTAT) 0.4 MG SL tablet, DISSOLVE 1 TABLET UNDER THE TONGUE EVERY 5 MINUTES AS NEEDED FOR CHEST PAIN, Disp: 30 tablet, Rfl: 0 ?  nitroGLYCERIN (NITROSTAT) 0.4 MG SL tablet, DISSOLVE ONE TABLET UNDER THE TONGUE EVERY 5 MINUTES AS NEEDED FOR CHEST PAIN. REPEAT EVERY 5 MINUTES UP TO 3 DOSES. IF NO RELIEF SEEK MEDICAL HELP.(DO NOT TAKE CIALIS, VIAGRA OR LEVITRA WHILE YOU ARE ON THIS MEDICATION )., Disp: ,  Rfl:  ?  potassium chloride SA (K-DUR,KLOR-CON) 20 MEQ tablet, Take 1 tablet (20 mEq total) by mouth daily., Disp: 30 tablet, Rfl: 1 ?  ranolazine (RANEXA) 1000 MG SR tablet, Take 1,000 mg by mouth 2 (two) times daily., Disp: , Rfl:  ?  rosuvastatin (CRESTOR) 20 MG tablet, Take 1 tablet (20 mg total) by mouth daily at 6 PM., Disp: 30 tablet, Rfl: 1 ?  Travoprost, BAK Free, (TRAVATAN) 0.004 % SOLN ophthalmic solution, Place 1 drop into both eyes at bedtime. , Disp: , Rfl:  ?  triamcinolone ointment (KENALOG) 0.1 %, Apply 1 application topically 2 (two) times daily., Disp: , Rfl:  ?  vitamin C (ASCORBIC ACID) 500 MG tablet, Take 500 mg by mouth 2 (two) times daily. , Disp: , Rfl:  ? ?Social History  ? ?Tobacco Use  ?Smoking Status Former  ? Packs/day: 1.00  ? Years: 16.00  ? Pack years: 16.00  ? Types: Cigarettes  ? Start date: 11/03/1969  ? Quit date:  07/22/1986  ? Years since quitting: 35.6  ?Smokeless Tobacco Never  ? ? ?Allergies  ?Allergen Reactions  ? Nitroglycerin Nausea Only and Other (See Comments)  ?  Patches only - headache ?  ? Statins Other (See Comments)  ?  BODY PAIN, Pt taking Crestor* ? ? ?  ? Ezetimibe Other (See Comments)  ? Fluvastatin   ?  Other reaction(s): Muscle pain  ? Fluvastatin Sodium   ?  Other reaction(s): Myalgias  ? Other Other (See Comments)  ? Rosuvastatin   ?  Other reaction(s): Muscle pain, Myalgias  ? Simvastatin   ?  Other reaction(s): Muscle pain, Myalgias  ? Lipitor [Atorvastatin] Other (See Comments) and Rash  ?  BODY PAIN  ? Lisinopril Other (See Comments) and Nausea Only  ?  Light headed, dizzy ?Other reaction(s): Dizziness  ? ?Objective:  ?There were no vitals filed for this visit. ?There is no height or weight on file to calculate BMI. ?Constitutional Well developed. ?Well nourished.  ?Vascular Dorsalis pedis pulses palpable bilaterally. ?Posterior tibial pulses palpable bilaterally. ?Capillary refill normal to all digits.  ?No cyanosis or clubbing noted. ?Pedal hair growth normal.  ?Neurologic Normal speech. ?Oriented to person, place, and time. ?Protective sensation absent  ?Dermatologic Wound Location: Right hallux ulceration with fat layer exposed.  Does not probe down to deep tissue/bone.  No malodor present.  No further erythema noted.  No purulent drainage noted ?Wound Base: Mixed Granular/Fibrotic ?Peri-wound: Calloused ?Exudate: Scant/small amount Serosanguinous exudate ?Wound Measurements: ?-See below  ?Orthopedic: No pain to palpation either foot.  ? ?Radiographs: None ?Assessment:  ? ?1. 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 ? ? ? ?Plan:  ?Patient was evaluated and treated and all questions answered. ? ?Ulcer right hallux ulceration with fat layer exposed  ?-Debridement as below. ?-Dressed with Santyl  wet-to-dry ?-Continue off-loading with surgical shoe. ?-Continue doxycycline was dispensed for skin and soft tissue prophylaxis until completed ?-ABIs PVRs were ordered to assess vascular flow to the right lower extremity in preparation for graft ?-Patient is currently not a smoker. ?-Pregrafting lab works were ordered.  Call [phone] ? ?Procedure: Excisional Debridement of Wound~stagnant ?Tool: Sharp chisel blade/tissue nipper ?Rationale: Removal of non-viable soft tissue from the wound to promote healing.  ?Anesthesia: none ?Pre-Debridement Wound Measurements: 0.4 cm x 0.3 cm x 0.3 cm ?Post-Debridement Wound Measurements: 0.5 cm x 0.5 cm x 0.3  cm  ?Type of Debridement: Sharp Excisional ?Tissue Removed: Non-viable soft tissue ?Blood loss: Minimal (<50cc) ?Depth of Debridement: subcutaneous tissue. ?Technique: Sharp excisional debridement to bleeding, viable wound base.  ?Wound Progress: The wound is improving. ?Decreased in size. ?Dressing: Dry, sterile, compression dressing. ?Disposition: Patient tolerated procedure well. Patient to return in 1 week for follow-up. ? ?No follow-ups on file. ? ?  ?

## 2022-02-26 DIAGNOSIS — S81811A Laceration without foreign body, right lower leg, initial encounter: Secondary | ICD-10-CM | POA: Diagnosis not present

## 2022-02-26 LAB — CBC WITH DIFFERENTIAL/PLATELET
Basophils Absolute: 0.1 10*3/uL (ref 0.0–0.2)
Basos: 1 %
EOS (ABSOLUTE): 0.1 10*3/uL (ref 0.0–0.4)
Eos: 1 %
Hematocrit: 40 % (ref 37.5–51.0)
Hemoglobin: 13.5 g/dL (ref 13.0–17.7)
Immature Grans (Abs): 0 10*3/uL (ref 0.0–0.1)
Immature Granulocytes: 0 %
Lymphocytes Absolute: 1.9 10*3/uL (ref 0.7–3.1)
Lymphs: 24 %
MCH: 32.3 pg (ref 26.6–33.0)
MCHC: 33.8 g/dL (ref 31.5–35.7)
MCV: 96 fL (ref 79–97)
Monocytes Absolute: 0.6 10*3/uL (ref 0.1–0.9)
Monocytes: 7 %
Neutrophils Absolute: 5.2 10*3/uL (ref 1.4–7.0)
Neutrophils: 67 %
Platelets: 234 10*3/uL (ref 150–450)
RBC: 4.18 x10E6/uL (ref 4.14–5.80)
RDW: 12.4 % (ref 11.6–15.4)
WBC: 7.9 10*3/uL (ref 3.4–10.8)

## 2022-02-26 LAB — HEMOGLOBIN A1C
Est. average glucose Bld gHb Est-mCnc: 177 mg/dL
Hgb A1c MFr Bld: 7.8 % — ABNORMAL HIGH (ref 4.8–5.6)

## 2022-02-26 LAB — COMPREHENSIVE METABOLIC PANEL
ALT: 9 IU/L (ref 0–44)
AST: 12 IU/L (ref 0–40)
Albumin/Globulin Ratio: 1.9 (ref 1.2–2.2)
Albumin: 4.3 g/dL (ref 3.7–4.7)
Alkaline Phosphatase: 119 IU/L (ref 44–121)
BUN/Creatinine Ratio: 13 (ref 10–24)
BUN: 19 mg/dL (ref 8–27)
Bilirubin Total: 0.7 mg/dL (ref 0.0–1.2)
CO2: 30 mmol/L — ABNORMAL HIGH (ref 20–29)
Calcium: 9.3 mg/dL (ref 8.6–10.2)
Chloride: 97 mmol/L (ref 96–106)
Creatinine, Ser: 1.45 mg/dL — ABNORMAL HIGH (ref 0.76–1.27)
Globulin, Total: 2.3 g/dL (ref 1.5–4.5)
Glucose: 154 mg/dL — ABNORMAL HIGH (ref 70–99)
Potassium: 4.5 mmol/L (ref 3.5–5.2)
Sodium: 139 mmol/L (ref 134–144)
Total Protein: 6.6 g/dL (ref 6.0–8.5)
eGFR: 51 mL/min/{1.73_m2} — ABNORMAL LOW (ref >59)

## 2022-02-26 LAB — SEDIMENTATION RATE: Sed Rate: 12 mm/hr (ref 0–30)

## 2022-02-26 LAB — C-REACTIVE PROTEIN: CRP: 3 mg/L (ref 0–10)

## 2022-02-27 DIAGNOSIS — Z96652 Presence of left artificial knee joint: Secondary | ICD-10-CM | POA: Diagnosis not present

## 2022-02-28 DIAGNOSIS — Z8639 Personal history of other endocrine, nutritional and metabolic disease: Secondary | ICD-10-CM | POA: Diagnosis not present

## 2022-02-28 DIAGNOSIS — S81811D Laceration without foreign body, right lower leg, subsequent encounter: Secondary | ICD-10-CM | POA: Diagnosis not present

## 2022-03-03 DIAGNOSIS — Z20822 Contact with and (suspected) exposure to covid-19: Secondary | ICD-10-CM | POA: Diagnosis not present

## 2022-03-05 ENCOUNTER — Other Ambulatory Visit: Payer: Self-pay | Admitting: Podiatry

## 2022-03-05 DIAGNOSIS — L97509 Non-pressure chronic ulcer of other part of unspecified foot with unspecified severity: Secondary | ICD-10-CM

## 2022-03-13 DIAGNOSIS — G43809 Other migraine, not intractable, without status migrainosus: Secondary | ICD-10-CM | POA: Diagnosis not present

## 2022-03-13 DIAGNOSIS — L97519 Non-pressure chronic ulcer of other part of right foot with unspecified severity: Secondary | ICD-10-CM | POA: Diagnosis not present

## 2022-03-13 DIAGNOSIS — R440 Auditory hallucinations: Secondary | ICD-10-CM | POA: Diagnosis not present

## 2022-03-13 DIAGNOSIS — Z9989 Dependence on other enabling machines and devices: Secondary | ICD-10-CM | POA: Diagnosis not present

## 2022-03-13 DIAGNOSIS — G4733 Obstructive sleep apnea (adult) (pediatric): Secondary | ICD-10-CM | POA: Diagnosis not present

## 2022-03-13 DIAGNOSIS — E11621 Type 2 diabetes mellitus with foot ulcer: Secondary | ICD-10-CM | POA: Diagnosis not present

## 2022-03-13 DIAGNOSIS — G939 Disorder of brain, unspecified: Secondary | ICD-10-CM | POA: Diagnosis not present

## 2022-03-14 ENCOUNTER — Ambulatory Visit (INDEPENDENT_AMBULATORY_CARE_PROVIDER_SITE_OTHER): Payer: Medicare Other | Admitting: Podiatry

## 2022-03-14 ENCOUNTER — Ambulatory Visit (INDEPENDENT_AMBULATORY_CARE_PROVIDER_SITE_OTHER): Payer: Medicare Other

## 2022-03-14 DIAGNOSIS — E0843 Diabetes mellitus due to underlying condition with diabetic autonomic (poly)neuropathy: Secondary | ICD-10-CM

## 2022-03-14 DIAGNOSIS — L97512 Non-pressure chronic ulcer of other part of right foot with fat layer exposed: Secondary | ICD-10-CM | POA: Diagnosis not present

## 2022-03-14 NOTE — Progress Notes (Signed)
? ?Subjective:  ?71 y.o. male with PMHx of diabetes mellitus presenting today with his wife for evaluation of an ulcer to the right great toe.  He was last seen here in the office on 02/25/2022 under the care of Dr. Posey Pronto.  They have been applying Santyl ointment and wearing the postsurgical shoe.  They present for further treatment and evaluation ? ? ?Past Medical History:  ?Diagnosis Date  ? Allergies   ? Bronchitis   ? PMH  ? CAD (coronary artery disease)   ? 07/2007  ? Diabetes ([place])   ? Elevated lipids   ? Glaucoma   ? HBP (high blood pressure)   ? Headache   ? Myocardial infarction [facility])   ? Neuropathy   ? Osteoarthritis   ? Skin cancer   ? Sleep apnea   ? CPAP  ? Wears glasses   ? Wears partial dentures   ? ?Past Surgical History:  ?Procedure Laterality Date  ? APPENDECTOMY    ? CARDIAC CATHETERIZATION N/A 06/11/2016  ? Procedure: Left Heart Cath and Coronary Angiography;  Surgeon: Corey Skains, MD;  Location: [place];  Service: Cardiovascular;  Laterality: N/A;  ? CARPAL TUNNEL RELEASE Left 04/24/2016  ? Procedure: CARPAL TUNNEL RELEASE;  Surgeon: Hessie Knows, MD;  Location: [facility];  Service: Orthopedics;  Laterality: Left;  ? CATARACT EXTRACTION W/ INTRAOCULAR LENS  IMPLANT, BILATERAL Bilateral   ? CATARACT EXTRACTION, BILATERAL    ? R eye 07/16/12, L eye 08/13/12 - with lens implant in both eyes  ? CORONARY ARTERY BYPASS GRAFT N/A 06/27/2016  ? Procedure: CORONARY ARTERY BYPASS GRAFTING times four using left internal mammary artery and right leg saphenous vein;  Surgeon: Ivin Poot, MD;  Location: [facility];  Service: Open Heart Surgery;  Laterality: N/A;  ? EYE SURGERY Bilateral   ? JOINT REPLACEMENT    ? KNEE ARTHROPLASTY Left 02/11/2016  ? Procedure: COMPUTER ASSISTED TOTAL KNEE ARTHROPLASTY;  Surgeon: Dereck Leep, MD;  Location: [facility];  Service: Orthopedics;  Laterality: Left;  ? KNEE ARTHROSCOPY Right   ? LEFT HEART CATH AND CORS/GRAFTS ANGIOGRAPHY Left 01/13/2017  ? Procedure:  Left Heart Cath and Cors/Grafts Angiography;  Surgeon: Corey Skains, MD;  Location: [facility];  Service: Cardiovascular;  Laterality: Left;  ? METATARSAL HEAD EXCISION Right 03/21/2020  ? Procedure: METATARSAL HEAD RESECTION RIGHT AND DEBRIDEMENT OF ULCER;  Surgeon: [name], DPM;  Location: [place];  Service: Podiatry;  Laterality: Right;  ? MULTIPLE TOOTH EXTRACTIONS    ? TEE WITHOUT CARDIOVERSION N/A 06/27/2016  ? Procedure: TRANSESOPHAGEAL ECHOCARDIOGRAM (TEE);  Surgeon: Ivin Poot, MD;  Location: [place];  Service: Open Heart Surgery;  Laterality: N/A;  ? ?Allergies  ?Allergen Reactions  ? Nitroglycerin Nausea Only and Other (See Comments)  ?  Patches only - headache ?  ? Statins Other (See Comments)  ?  BODY PAIN, Pt taking Crestor* ? ? ?  ? Ezetimibe Other (See Comments)  ? Fluvastatin   ?  Other reaction(s): Muscle pain  ? Fluvastatin Sodium   ?  Other reaction(s): Myalgias  ? Other Other (See Comments)  ? Rosuvastatin   ?  Other reaction(s): Muscle pain, Myalgias  ? Simvastatin   ?  Other reaction(s): Muscle pain, Myalgias  ? Lipitor [Atorvastatin] Other (See Comments) and Rash  ?  BODY PAIN  ? Lisinopril Other (See Comments) and Nausea Only  ?  Light headed, dizzy ?Other reaction(s): Dizziness  ? ? ? ? ? ? ? ?  ?  Objective/Physical Exam ?General: The patient is alert and oriented x3 in no acute distress. ? ?Dermatology:  ?Wound #1 noted to the encompassing the right hallux.  Dorsal wound measures approximately 1.0 x 2.0 x 0.1 cm.  Plantar wound measures approximately 1.0 x 2.0 x 0.1 cm (LxWxD).  Please see above noted photos ? ?To the noted ulceration(s), there is no eschar. There is a moderate amount of slough, fibrin, and necrotic tissue noted especially to the plantar foot wound. Granulation tissue and wound base is red to the dorsal foot wound. There is a minimal amount of serosanguineous drainage noted. There is no exposed bone muscle-tendon ligament or joint. There is no malodor.  Periwound integrity is intact. ?Skin is warm, dry and supple bilateral lower extremities. ? ?Vascular: Patient has ABI of the lower extremity scheduled for Tuesday, 03/18/2022 ? ?Neurological: Light touch and protective threshold absent bilaterally.  ? ?Musculoskeletal Exam: History of prior toe amputation LT foot. ? ?Radiographic exam RT foot 03/14/2022: Normal osseous mineralization.  No cortical irregularities or erosions concerning for osteomyelitis. ? ?Assessment: ?1.  Ulcer right hallux secondary to diabetes mellitus ?2. diabetes mellitus w/ peripheral neuropathy ? ? ?Plan of Care:  ?1. Patient was evaluated. ?2. medically necessary excisional debridement including subcutaneous tissue was performed using a tissue nipper and a chisel blade. Excisional debridement of all the necrotic nonviable tissue down to healthy bleeding viable tissue was performed with post-debridement measurements same as pre-. ?3. the wound was cleansed and dry sterile dressing applied. ?4.  Continue Santyl with a light dressing daily ?5.  Continue postsurgical shoe ?6.  Patient is to return to clinic in 3 weeks to review ABI results and discuss further treatment options ? ? ?[name], DPM ?[place] ? ?Dr. [name], DPM  ?  ?[address]                                       ?[place], [place] [address]                ?Office [phone]  ?Fax [phone] ? ? ? ? ?

## 2022-03-18 ENCOUNTER — Encounter: Payer: Self-pay | Admitting: Cardiovascular Disease

## 2022-03-18 ENCOUNTER — Ambulatory Visit (INDEPENDENT_AMBULATORY_CARE_PROVIDER_SITE_OTHER): Payer: Medicare Other | Admitting: Cardiovascular Disease

## 2022-03-18 ENCOUNTER — Ambulatory Visit (INDEPENDENT_AMBULATORY_CARE_PROVIDER_SITE_OTHER): Payer: Medicare Other

## 2022-03-18 VITALS — BP 118/60 | HR 59 | Ht 70.0 in | Wt 313.1 lb

## 2022-03-18 DIAGNOSIS — L97512 Non-pressure chronic ulcer of other part of right foot with fat layer exposed: Secondary | ICD-10-CM | POA: Diagnosis not present

## 2022-03-18 DIAGNOSIS — I2510 Atherosclerotic heart disease of native coronary artery without angina pectoris: Secondary | ICD-10-CM

## 2022-03-18 DIAGNOSIS — I739 Peripheral vascular disease, unspecified: Secondary | ICD-10-CM

## 2022-03-18 DIAGNOSIS — E785 Hyperlipidemia, unspecified: Secondary | ICD-10-CM

## 2022-03-18 DIAGNOSIS — I10 Essential (primary) hypertension: Secondary | ICD-10-CM

## 2022-03-18 DIAGNOSIS — L97509 Non-pressure chronic ulcer of other part of unspecified foot with unspecified severity: Secondary | ICD-10-CM

## 2022-03-18 DIAGNOSIS — I779 Disorder of arteries and arterioles, unspecified: Secondary | ICD-10-CM | POA: Diagnosis not present

## 2022-03-18 DIAGNOSIS — E08621 Diabetes mellitus due to underlying condition with foot ulcer: Secondary | ICD-10-CM

## 2022-03-18 NOTE — Patient Instructions (Signed)
Medication Instructions:  ?Your physician recommends that you continue on your current medications as directed. Please refer to the Current Medication list given to you today. ? ?*If you need a refill on your cardiac medications before your next appointment, please call your pharmacy* ? ? ?Lab Work: ?Bmp and Cbc. Please see instructions below: ? ?If you have labs (blood work) drawn today and your tests are completely normal, you will receive your results only by: ?MyChart Message (if you have MyChart) OR ?A paper copy in the mail ?If you have any lab test that is abnormal or we need to change your treatment, we will call you to review the results. ? ? ?Testing/Procedures: ?Dr. Fletcher Anon has recommended that you have an abdominal aortogram ? ? ?Follow-Up: ?At [facility], you and your health needs are our priority.  As part of our continuing mission to provide you with exceptional heart care, we have created designated Provider Care Teams.  These Care Teams include your primary Cardiologist (physician) and Advanced Practice Providers (APPs -  Physician Assistants and Nurse Practitioners) who all work together to provide you with the care you need, when you need it. ? ?We recommend signing up for the patient portal called "MyChart".  Sign up information is provided on this After Visit Summary.  MyChart is used to connect with patients for Virtual Visits (Telemedicine).  Patients are able to view lab/test results, encounter notes, upcoming appointments, etc.  Non-urgent messages can be sent to your provider as well.   ?To learn more about what you can do with MyChart, go to [email or web address].   ? ?Your next appointment:   ?3 week(s) ? ?The format for your next appointment:   ?In Person ? ?Provider:   ?You may see Kathlyn Sacramento, MD or one of the following Advanced Practice Providers on your designated Care Team:   ?Murray Hodgkins, NP ?Christell Faith, PA-C ?Cadence Kathlen Mody, PA-C{ ? ? ? ?Other Instructions ? ?[place] ?[facility] ?[address], [address] ?[place] [address] ?Dept: [phone] ?Loc: [phone] ? ?DAYVEN LINSLEY  03/18/2022 ? ?You are scheduled for a Peripheral Angiogram on Wednesday, May 24 with Dr. Kathlyn Sacramento. ? ?1. Please arrive at the Main Entrance A at [facility]: [address], [place] [address] at 6:00 AM (This time is 4 1/2 hours before your procedure to ensure your preparation). Free valet parking service is available.  ? ?Special note: Every effort is made to have your procedure done on time. Please understand that emergencies sometimes delay scheduled procedures. ? ?2. Diet: Do not eat solid foods after midnight.  You may have clear liquids until 5 AM upon the day of the procedure. ? ?3. Labs: You will need to have blood drawn on Monday, May 22 at [facility] Entrance, Go to 1st desk on your right to register.  ?Address: [address], [place] [address]  ?Open: 8am - 5pm  Phone: [phone]. You do not need to be fasting. ? ?4. Medication instructions in preparation for your procedure: ? ? Contrast Allergy: No ? ? ? ? ?Stop taking, Lasix (Furosemide)  Wednesday, May 24, ? ?Take only 10 units of insulin the night before your procedure. Do not take any insulin on the day of the procedure. ? ? ? ?On the morning of your procedure, take Aspirin and any morning medicines NOT listed above.  You may use sips of water. ? ?5. Plan to go  home the same day, you will only stay overnight if medically necessary. ?6. You MUST have a responsible adult to drive you home. ?7. An adult MUST be with you the first 24 hours after you arrive home. ?8. Bring a current list of your medications, and the last time and date medication taken. ?9. Bring ID and current insurance cards. ?10.Please wear clothes that are easy to get on and off and wear slip-on shoes. ? ?Thank you for allowing [place] to care for you! ?  --  [facility] ? ? ?Important Information About Sugar ? ? ? ? ? ? ?

## 2022-03-18 NOTE — Progress Notes (Signed)
?  ?Cardiology Office Note ? ? ?Date:  03/19/2022  ? ?ID:  DAREY HERSHBERGER, DOB 1951-06-22, MRN [id number] ? ?PCP:  Pleas Koch, NP  ?Cardiologist: Dr. Nehemiah Massed ? ?Chief Complaint  ?Patient presents with  ? Other  ?  Discuss imaging no complaints today. Meds reviewed verbally with pt.  ? ? ?  ?History of Present Illness: ?Lawrence [name] is a 71 y.o. male who was referred by Dr. Amalia Hailey for evaluation and management of peripheral arterial disease. ?He has extensive cardiac history followed by Dr. Nehemiah Massed. He has known history of coronary artery disease status post CABG in 2017 with known occluded RCA graft, essential hypertension, sleep apnea on CPAP, hyperlipidemia, type 2 diabetes, obesity, chronic venous insufficiency, chronic kidney disease and bilateral carotid disease.  He was seen recently by Dr. Amalia Hailey for ulceration on the right great toe.  The ulceration has been present since January and has not healed.  He was referred for vascular testing which showed an ABI of 0.75 on the right and 0.87 on the left.  Right great toe pressure was significantly reduced at 44.  Duplex on the right showed occluded mid to distal popliteal artery.  ?The patient had prior left second toe amputation due to osteomyelitis. ?He has been diabetic since 1972.  He is a previous smoker and quit in 1987.  He has no chest pain but does have chronic exertional dyspnea. ? ? ?Past Medical History:  ?Diagnosis Date  ? Allergies   ? Bronchitis   ? PMH  ? CAD (coronary artery disease)   ? 07/2007  ? Diabetes ([place])   ? Elevated lipids   ? Glaucoma   ? HBP (high blood pressure)   ? Headache   ? Myocardial infarction [facility])   ? Neuropathy   ? Osteoarthritis   ? Skin cancer   ? Sleep apnea   ? CPAP  ? Wears glasses   ? Wears partial dentures   ? ? ?Past Surgical History:  ?Procedure Laterality Date  ? APPENDECTOMY    ? CARDIAC CATHETERIZATION N/A 06/11/2016  ? Procedure: Left Heart Cath and Coronary Angiography;  Surgeon: Corey Skains, MD;   Location: [place] [facility];  Service: Cardiovascular;  Laterality: N/A;  ? CARPAL TUNNEL RELEASE Left 04/24/2016  ? Procedure: CARPAL TUNNEL RELEASE;  Surgeon: Hessie Knows, MD;  Location: [facility];  Service: Orthopedics;  Laterality: Left;  ? CATARACT EXTRACTION W/ INTRAOCULAR LENS  IMPLANT, BILATERAL Bilateral   ? CATARACT EXTRACTION, BILATERAL    ? R eye 07/16/12, L eye 08/13/12 - with lens implant in both eyes  ? CORONARY ARTERY BYPASS GRAFT N/A 06/27/2016  ? Procedure: CORONARY ARTERY BYPASS GRAFTING times four using left internal mammary artery and right leg saphenous vein;  Surgeon: Ivin Poot, MD;  Location: [place];  Service: Open Heart Surgery;  Laterality: N/A;  ? EYE SURGERY Bilateral   ? JOINT REPLACEMENT    ? KNEE ARTHROPLASTY Left 02/11/2016  ? Procedure: COMPUTER ASSISTED TOTAL KNEE ARTHROPLASTY;  Surgeon: Dereck Leep, MD;  Location: [facility];  Service: Orthopedics;  Laterality: Left;  ? KNEE ARTHROSCOPY Right   ? LEFT HEART CATH AND CORS/GRAFTS ANGIOGRAPHY Left 01/13/2017  ? Procedure: Left Heart Cath and Cors/Grafts Angiography;  Surgeon: Corey Skains, MD;  Location: [place] [facility];  Service: Cardiovascular;  Laterality: Left;  ? METATARSAL HEAD EXCISION Right 03/21/2020  ? Procedure: METATARSAL HEAD RESECTION RIGHT AND DEBRIDEMENT OF ULCER;  Surgeon: [name],  DPM;  Location: [place];  Service: Podiatry;  Laterality: Right;  ? MULTIPLE TOOTH EXTRACTIONS    ? TEE WITHOUT CARDIOVERSION N/A 06/27/2016  ? Procedure: TRANSESOPHAGEAL ECHOCARDIOGRAM (TEE);  Surgeon: Ivin Poot, MD;  Location: [place];  Service: Open Heart Surgery;  Laterality: N/A;  ? ? ? ?Current Outpatient Medications  ?Medication Sig Dispense Refill  ? AIMOVIG 140 MG/ML SOAJ Inject 140 mg into the skin every 30 (thirty) days.    ? aspirin 81 MG EC tablet Take 81 mg by mouth daily. Swallow whole.    ? cholecalciferol (VITAMIN D) 1000 units tablet Take 2,000 Units by mouth in the morning and at bedtime.     ?  clopidogrel (PLAVIX) 75 MG tablet Take 1 tablet (75 mg total) by mouth daily. 30 tablet 0  ? famotidine (PEPCID) 20 MG tablet Take 1 tablet (20 mg total) by mouth daily. (Patient not taking: Reported on 03/19/2022) 30 tablet 0  ? furosemide (LASIX) 40 MG tablet Take 1 tablet (40 mg total) by mouth daily. 30 tablet 1  ? glucagon 1 MG injection Inject 1 mg into the vein once as needed. Reported on 02/11/2016    ? GLUTOSE 15 40 % GEL 1 Tube once as needed for low blood sugar.    ? insulin aspart (NOVOLOG) 100 UNIT/ML injection Inject 20 Units into the skin 3 (three) times daily. (Patient taking differently: Inject 10-20 Units into the skin 3 (three) times daily with meals. Sliding Scale) 10 mL 5  ? insulin glargine-yfgn (SEMGLEE) 100 UNIT/ML injection 20 Units at bedtime.    ? isosorbide mononitrate (IMDUR) 60 MG 24 hr tablet Take 60 mg by mouth every evening.    ? loratadine (CLARITIN) 10 MG tablet Take 10 mg by mouth daily.    ? metoprolol tartrate (LOPRESSOR) 25 MG tablet Take 25 mg by mouth 2 (two) times daily.    ? nitroGLYCERIN (NITROSTAT) 0.4 MG SL tablet DISSOLVE 1 TABLET UNDER THE TONGUE EVERY 5 MINUTES AS NEEDED FOR CHEST PAIN 30 tablet 0  ? potassium chloride SA (K-DUR,KLOR-CON) 20 MEQ tablet Take 1 tablet (20 mEq total) by mouth daily. 30 tablet 1  ? ranolazine (RANEXA) 1000 MG SR tablet Take 1,000 mg by mouth 2 (two) times daily.    ? rosuvastatin (CRESTOR) 20 MG tablet Take 1 tablet (20 mg total) by mouth daily at 6 PM. 30 tablet 1  ? Travoprost, BAK Free, (TRAVATAN) 0.004 % SOLN ophthalmic solution Place 1 drop into both eyes at bedtime.     ? triamcinolone ointment (KENALOG) 0.1 % Apply 1 application. topically daily as needed (rash).    ? vitamin C (ASCORBIC ACID) 500 MG tablet Take 500 mg by mouth 2 (two) times daily.     ? Calcium-Magnesium-Vitamin D (CALCIUM 1200+D3 PO) Take 1 tablet by mouth daily. D3 25 mcg    ? collagenase (SANTYL) 250 UNIT/GM ointment Apply 1 application. topically daily.    ?  fluticasone (FLONASE) 50 MCG/ACT nasal spray Place 1 spray into both nostrils daily as needed for allergies.    ? magnesium oxide (MAG-OX) 400 MG tablet Take 400 mg by mouth daily.    ? raNITIdine HCl (ACID REDUCER PO) Take 1 tablet by mouth daily.    ? ?No current facility-administered medications for this visit.  ? ? ?Allergies:   Nitroglycerin, Statins, Fluvastatin, Fluvastatin sodium, Other, Rosuvastatin, Simvastatin, Ezetimibe, Lipitor [atorvastatin], and Lisinopril  ? ? ?Social History:  The patient  reports that he quit smoking about  35 years ago. His smoking use included cigarettes. He started smoking about 52 years ago. He has a 16.00 pack-year smoking history. He has never used smokeless tobacco. He reports that he does not currently use alcohol. He reports that he does not use drugs.  ? ?Family History:  The patient's family history includes Arthritis in his maternal grandfather, maternal grandmother, paternal grandfather, and paternal grandmother; Diabetes in his father; Drug abuse in his brother; Heart Problems in his mother; Heart attack in his maternal grandmother and paternal grandmother; Heart attack (age of onset: 24) in his father; Heart disease in his brother; Hypertension in his father and mother; Lupus in his mother.  ? ? ?ROS:  Please see the history of present illness.   Otherwise, review of systems are positive for none.   All other systems are reviewed and negative.  ? ? ?PHYSICAL EXAM: ?VS:  BP 118/60 (BP Location: Right Arm, Patient Position: Sitting, Cuff Size: Large)   Pulse (!) 59   Ht '5\' 10"'$  (1.778 m)   Wt (!) 313 lb 2 oz (142 kg)   SpO2 96%   BMI 44.93 kg/m?  , BMI Body mass index is 44.93 kg/m?. ?GEN: Well nourished, well developed, in no acute distress  ?HEENT: normal  ?Neck: no JVD, carotid bruits, or masses ?Cardiac: RRR; no  rubs, or gallops, 1 out of 6 systolic murmur in the aortic area.  Moderate bilateral leg edema. ?Respiratory:  clear to auscultation bilaterally,  normal work of breathing ?GI: soft, nontender, nondistended, + BS ?MS: no deformity or atrophy  ?Skin: warm and dry, no rash ?Neuro:  Strength and sensation are intact ?Psych: euthymic mood, full affect ? ? ?EKG:  EKG i

## 2022-03-18 NOTE — H&P (View-Only) (Signed)
Cardiology Office Note   Date:  03/19/2022   ID:  Lawrence, [name] 1951/07/16, MRN [id number]  PCP:  Pleas Koch, NP  Cardiologist: Dr. Nehemiah Massed  Chief Complaint  Patient presents with   Other    Discuss imaging no complaints today. Meds reviewed verbally with pt.      History of Present Illness: Lawrence [name] is a 71 y.o. male who was referred by Dr. Amalia Hailey for evaluation and management of peripheral arterial disease. He has extensive cardiac history followed by Dr. Nehemiah Massed. He has known history of coronary artery disease status post CABG in 2017 with known occluded RCA graft, essential hypertension, sleep apnea on CPAP, hyperlipidemia, type 2 diabetes, obesity, chronic venous insufficiency, chronic kidney disease and bilateral carotid disease.  He was seen recently by Dr. Amalia Hailey for ulceration on the right great toe.  The ulceration has been present since January and has not healed.  He was referred for vascular testing which showed an ABI of 0.75 on the right and 0.87 on the left.  Right great toe pressure was significantly reduced at 44.  Duplex on the right showed occluded mid to distal popliteal artery.  The patient had prior left second toe amputation due to osteomyelitis. He has been diabetic since 1972.  He is a previous smoker and quit in 1987.  He has no chest pain but does have chronic exertional dyspnea.   Past Medical History:  Diagnosis Date   Allergies    Bronchitis    PMH   CAD (coronary artery disease)    07/2007   Diabetes ([facility])    Elevated lipids    Glaucoma    HBP (high blood pressure)    Headache    Myocardial infarction ([facility])    Neuropathy    Osteoarthritis    Skin cancer    Sleep apnea    CPAP   Wears glasses    Wears partial dentures     Past Surgical History:  Procedure Laterality Date   APPENDECTOMY     CARDIAC CATHETERIZATION N/A 06/11/2016   Procedure: Left Heart Cath and Coronary Angiography;  Surgeon: Corey Skains, MD;   Location: [place] [facility];  Service: Cardiovascular;  Laterality: N/A;   CARPAL TUNNEL RELEASE Left 04/24/2016   Procedure: CARPAL TUNNEL RELEASE;  Surgeon: Hessie Knows, MD;  Location: [facility];  Service: Orthopedics;  Laterality: Left;   CATARACT EXTRACTION W/ INTRAOCULAR LENS  IMPLANT, BILATERAL Bilateral    CATARACT EXTRACTION, BILATERAL     R eye 07/16/12, L eye 08/13/12 - with lens implant in both eyes   CORONARY ARTERY BYPASS GRAFT N/A 06/27/2016   Procedure: CORONARY ARTERY BYPASS GRAFTING times four using left internal mammary artery and right leg saphenous vein;  Surgeon: Ivin Poot, MD;  Location: [place];  Service: Open Heart Surgery;  Laterality: N/A;   EYE SURGERY Bilateral    JOINT REPLACEMENT     KNEE ARTHROPLASTY Left 02/11/2016   Procedure: COMPUTER ASSISTED TOTAL KNEE ARTHROPLASTY;  Surgeon: Dereck Leep, MD;  Location: [facility];  Service: Orthopedics;  Laterality: Left;   KNEE ARTHROSCOPY Right    LEFT HEART CATH AND CORS/GRAFTS ANGIOGRAPHY Left 01/13/2017   Procedure: Left Heart Cath and Cors/Grafts Angiography;  Surgeon: Corey Skains, MD;  Location: [place] [facility];  Service: Cardiovascular;  Laterality: Left;   METATARSAL HEAD EXCISION Right 03/21/2020   Procedure: METATARSAL HEAD RESECTION RIGHT AND DEBRIDEMENT OF ULCER;  Surgeon: [name],  DPM;  Location: [place];  Service: Podiatry;  Laterality: Right;   MULTIPLE TOOTH EXTRACTIONS     TEE WITHOUT CARDIOVERSION N/A 06/27/2016   Procedure: TRANSESOPHAGEAL ECHOCARDIOGRAM (TEE);  Surgeon: Ivin Poot, MD;  Location: [place];  Service: Open Heart Surgery;  Laterality: N/A;     Current Outpatient Medications  Medication Sig Dispense Refill   AIMOVIG 140 MG/ML SOAJ Inject 140 mg into the skin every 30 (thirty) days.     aspirin 81 MG EC tablet Take 81 mg by mouth daily. Swallow whole.     cholecalciferol (VITAMIN D) 1000 units tablet Take 2,000 Units by mouth in the morning and at bedtime.       clopidogrel (PLAVIX) 75 MG tablet Take 1 tablet (75 mg total) by mouth daily. 30 tablet 0   famotidine (PEPCID) 20 MG tablet Take 1 tablet (20 mg total) by mouth daily. (Patient not taking: Reported on 03/19/2022) 30 tablet 0   furosemide (LASIX) 40 MG tablet Take 1 tablet (40 mg total) by mouth daily. 30 tablet 1   glucagon 1 MG injection Inject 1 mg into the vein once as needed. Reported on 02/11/2016     GLUTOSE 15 40 % GEL 1 Tube once as needed for low blood sugar.     insulin aspart (NOVOLOG) 100 UNIT/ML injection Inject 20 Units into the skin 3 (three) times daily. (Patient taking differently: Inject 10-20 Units into the skin 3 (three) times daily with meals. Sliding Scale) 10 mL 5   insulin glargine-yfgn (SEMGLEE) 100 UNIT/ML injection 20 Units at bedtime.     isosorbide mononitrate (IMDUR) 60 MG 24 hr tablet Take 60 mg by mouth every evening.     loratadine (CLARITIN) 10 MG tablet Take 10 mg by mouth daily.     metoprolol tartrate (LOPRESSOR) 25 MG tablet Take 25 mg by mouth 2 (two) times daily.     nitroGLYCERIN (NITROSTAT) 0.4 MG SL tablet DISSOLVE 1 TABLET UNDER THE TONGUE EVERY 5 MINUTES AS NEEDED FOR CHEST PAIN 30 tablet 0   potassium chloride SA (K-DUR,KLOR-CON) 20 MEQ tablet Take 1 tablet (20 mEq total) by mouth daily. 30 tablet 1   ranolazine (RANEXA) 1000 MG SR tablet Take 1,000 mg by mouth 2 (two) times daily.     rosuvastatin (CRESTOR) 20 MG tablet Take 1 tablet (20 mg total) by mouth daily at 6 PM. 30 tablet 1   Travoprost, BAK Free, (TRAVATAN) 0.004 % SOLN ophthalmic solution Place 1 drop into both eyes at bedtime.      triamcinolone ointment (KENALOG) 0.1 % Apply 1 application. topically daily as needed (rash).     vitamin C (ASCORBIC ACID) 500 MG tablet Take 500 mg by mouth 2 (two) times daily.      Calcium-Magnesium-Vitamin D (CALCIUM 1200+D3 PO) Take 1 tablet by mouth daily. D3 25 mcg     collagenase (SANTYL) 250 UNIT/GM ointment Apply 1 application. topically daily.      fluticasone (FLONASE) 50 MCG/ACT nasal spray Place 1 spray into both nostrils daily as needed for allergies.     magnesium oxide (MAG-OX) 400 MG tablet Take 400 mg by mouth daily.     raNITIdine HCl (ACID REDUCER PO) Take 1 tablet by mouth daily.     No current facility-administered medications for this visit.    Allergies:   Nitroglycerin, Statins, Fluvastatin, Fluvastatin sodium, Other, Rosuvastatin, Simvastatin, Ezetimibe, Lipitor [atorvastatin], and Lisinopril    Social History:  The patient  reports that he quit smoking about  35 years ago. His smoking use included cigarettes. He started smoking about 52 years ago. He has a 16.00 pack-year smoking history. He has never used smokeless tobacco. He reports that he does not currently use alcohol. He reports that he does not use drugs.   Family History:  The patient's family history includes Arthritis in his maternal grandfather, maternal grandmother, paternal grandfather, and paternal grandmother; Diabetes in his father; Drug abuse in his brother; Heart Problems in his mother; Heart attack in his maternal grandmother and paternal grandmother; Heart attack (age of onset: 14) in his father; Heart disease in his brother; Hypertension in his father and mother; Lupus in his mother.    ROS:  Please see the history of present illness.   Otherwise, review of systems are positive for none.   All other systems are reviewed and negative.    PHYSICAL EXAM: VS:  BP 118/60 (BP Location: Right Arm, Patient Position: Sitting, Cuff Size: Large)   Pulse (!) 59   Ht '5\' 10"'$  (1.778 m)   Wt (!) 313 lb 2 oz (142 kg)   SpO2 96%   BMI 44.93 kg/m  , BMI Body mass index is 44.93 kg/m. GEN: Well nourished, well developed, in no acute distress  HEENT: normal  Neck: no JVD, carotid bruits, or masses Cardiac: RRR; no  rubs, or gallops, 1 out of 6 systolic murmur in the aortic area.  Moderate bilateral leg edema. Respiratory:  clear to auscultation bilaterally,  normal work of breathing GI: soft, nontender, nondistended, + BS MS: no deformity or atrophy  Skin: warm and dry, no rash Neuro:  Strength and sensation are intact Psych: euthymic mood, full affect   EKG:  EKG is ordered today. The ekg ordered today demonstrates normal sinus rhythm with low voltage and poor R wave progression in the anterior leads.   Recent Labs: 03/22/2021: Pro B Natriuretic peptide (BNP) 48.0 02/25/2022: ALT 9; BUN 19; Creatinine, Ser 1.45; Hemoglobin 13.5; Platelets 234; Potassium 4.5; Sodium 139    Lipid Panel    Component Value Date/Time   CHOL 136 04/22/2019 0801   CHOL 167 10/11/2013 0606   TRIG 53.0 04/22/2019 0801   TRIG 53 10/11/2013 0606   HDL 64.10 04/22/2019 0801   HDL 77 (H) 10/11/2013 0606   CHOLHDL 2 04/22/2019 0801   VLDL 10.6 04/22/2019 0801   VLDL 11 10/11/2013 0606   LDLCALC 61 04/22/2019 0801   LDLCALC 79 10/11/2013 0606      Wt Readings from Last 3 Encounters:  03/18/22 (!) 313 lb 2 oz (142 kg)  12/26/21 (!) 316 lb (143.3 kg)  10/10/21 (!) 325 lb (147.4 kg)         03/18/2022    3:50 PM  PAD Screen  Previous PAD dx? No  Previous surgical procedure? No  Pain with walking? No  Feet/toe relief with dangling? No  Painful, non-healing ulcers? Yes  Extremities discolored? No      ASSESSMENT AND PLAN:  1.  Peripheral arterial disease: Nonhealing ulceration on the right big toe.  High risk for tissue loss and amputation considering his prolonged history of diabetes and newly diagnosed peripheral arterial disease.  His right popliteal artery seems to be occluded.  His perfusion pressure is low and not optimal for wound healing.  Thus, I recommend proceeding with abdominal aortogram with right lower extremity angiography and possible endovascular intervention.  I discussed the procedure in details as well as risks and benefits.  Planned access is via the left  common femoral artery.  He is at high risk for contrast-induced nephropathy  given chronic kidney disease and prolonged history of diabetes.  We will plan on hydrating him 4 hours before the procedure.  He is already on dual antiplatelet therapy.  2.  Coronary artery disease involving native coronary arteries without angina: No anginal symptoms are reported other than chronic exertional dyspnea.  Continue medical therapy.  3.  Essential hypertension: Blood pressures controlled on current medications.  4.  Hyperlipidemia: He is currently on rosuvastatin 20 mg once daily.  Recommended target LDL of less than 70.  5.  Carotid artery disease: Asymptomatic continue treatment of risk factors.    Disposition: Proceed with an angiogram next week.  Follow-up in 1 month.  Signed,  Kathlyn Sacramento, MD  03/19/2022 5:02 PM    [place]

## 2022-03-24 ENCOUNTER — Other Ambulatory Visit
Admission: RE | Admit: 2022-03-24 | Discharge: 2022-03-24 | Disposition: A | Discharge status: Home / Self Care | Payer: Medicare Other | Attending: Cardiovascular Disease | Admitting: Cardiovascular Disease

## 2022-03-24 DIAGNOSIS — I739 Peripheral vascular disease, unspecified: Secondary | ICD-10-CM | POA: Diagnosis not present

## 2022-03-24 DIAGNOSIS — I2510 Atherosclerotic heart disease of native coronary artery without angina pectoris: Secondary | ICD-10-CM | POA: Diagnosis not present

## 2022-03-24 LAB — CBC WITH DIFFERENTIAL/PLATELET
Abs Immature Granulocytes: 0.03 10*3/uL (ref 0.00–0.07)
Basophils Absolute: 0 10*3/uL (ref 0.0–0.1)
Basophils Relative: 1 %
Eosinophils Absolute: 0.1 10*3/uL (ref 0.0–0.5)
Eosinophils Relative: 2 %
HCT: 36 % — ABNORMAL LOW (ref 39.0–52.0)
Hemoglobin: 11.8 g/dL — ABNORMAL LOW (ref 13.0–17.0)
Immature Granulocytes: 1 %
Lymphocytes Relative: 25 %
Lymphs Abs: 1.3 10*3/uL (ref 0.7–4.0)
MCH: 32.2 pg (ref 26.0–34.0)
MCHC: 32.8 g/dL (ref 30.0–36.0)
MCV: 98.1 fL (ref 80.0–100.0)
Monocytes Absolute: 0.4 10*3/uL (ref 0.1–1.0)
Monocytes Relative: 8 %
Neutro Abs: 3.4 10*3/uL (ref 1.7–7.7)
Neutrophils Relative %: 63 %
Platelets: 227 10*3/uL (ref 150–400)
RBC: 3.67 MIL/uL — ABNORMAL LOW (ref 4.22–5.81)
RDW: 12.1 % (ref 11.5–15.5)
WBC: 5.3 10*3/uL (ref 4.0–10.5)
nRBC: 0 % (ref 0.0–0.2)

## 2022-03-24 LAB — BASIC METABOLIC PANEL
Anion gap: 7 (ref 5–15)
BUN: 26 mg/dL — ABNORMAL HIGH (ref 8–23)
CO2: 28 mmol/L (ref 22–32)
Calcium: 8.5 mg/dL — ABNORMAL LOW (ref 8.9–10.3)
Chloride: 101 mmol/L (ref 98–111)
Creatinine, Ser: 1.27 mg/dL — ABNORMAL HIGH (ref 0.61–1.24)
GFR, Estimated: 60 mL/min — ABNORMAL LOW (ref >60)
Glucose, Bld: 238 mg/dL — ABNORMAL HIGH (ref 70–99)
Potassium: 4.7 mmol/L (ref 3.5–5.1)
Sodium: 136 mmol/L (ref 135–145)

## 2022-03-25 ENCOUNTER — Ambulatory Visit: Payer: Medicare Other | Admitting: Cardiovascular Disease

## 2022-03-25 ENCOUNTER — Telehealth: Payer: Self-pay | Admitting: *Deleted

## 2022-03-25 NOTE — Telephone Encounter (Addendum)
Abdominal aortogram scheduled at [facility] for: Wednesday Mar 26, 2022 10:30 AM Arrival time and place: [facility] A at: 6 AM-pre-procedure hydration  Nothing to eat after midnight prior to procedure, clear liquids until 5 AM day of procedure.  Medication instructions: -Hold:  Insulin-AM of procedure/1/2 usual Insulin HS prior to procedure   Lasix/KCl-AM of procedure -Except hold medications usual morning medications can be taken with sips of water including aspirin 81 mg and Plavix 75 mg.   Confirmed patient has responsible adult to drive home post procedure and be with patient first 24 hours after arriving home.  Patient reports no new symptoms concerning for COVID-19/no exposure to COVID-19 in the past 10 days.  Reviewed procedure instructions with patient.

## 2022-03-26 ENCOUNTER — Ambulatory Visit (HOSPITAL_COMMUNITY)
Admission: RE | Disposition: A | Discharge status: Home / Self Care | Payer: Self-pay | Source: Home / Self Care | Attending: Cardiovascular Disease

## 2022-03-26 ENCOUNTER — Encounter (HOSPITAL_COMMUNITY): Payer: Self-pay | Admitting: Cardiovascular Disease

## 2022-03-26 ENCOUNTER — Ambulatory Visit (HOSPITAL_COMMUNITY)
Admission: RE | Admit: 2022-03-26 | Discharge: 2022-03-26 | Disposition: A | Discharge status: Home / Self Care | Payer: Medicare Other | Attending: Cardiovascular Disease | Admitting: Cardiovascular Disease

## 2022-03-26 DIAGNOSIS — Z951 Presence of aortocoronary bypass graft: Secondary | ICD-10-CM | POA: Diagnosis not present

## 2022-03-26 DIAGNOSIS — Z89422 Acquired absence of other left toe(s): Secondary | ICD-10-CM | POA: Insufficient documentation

## 2022-03-26 DIAGNOSIS — Z6841 Body Mass Index (BMI) 40.0 and over, adult: Secondary | ICD-10-CM | POA: Diagnosis not present

## 2022-03-26 DIAGNOSIS — E669 Obesity, unspecified: Secondary | ICD-10-CM | POA: Diagnosis not present

## 2022-03-26 DIAGNOSIS — E11621 Type 2 diabetes mellitus with foot ulcer: Secondary | ICD-10-CM | POA: Insufficient documentation

## 2022-03-26 DIAGNOSIS — I129 Hypertensive chronic kidney disease with stage 1 through stage 4 chronic kidney disease, or unspecified chronic kidney disease: Secondary | ICD-10-CM | POA: Diagnosis not present

## 2022-03-26 DIAGNOSIS — E1151 Type 2 diabetes mellitus with diabetic peripheral angiopathy without gangrene: Secondary | ICD-10-CM | POA: Insufficient documentation

## 2022-03-26 DIAGNOSIS — Z794 Long term (current) use of insulin: Secondary | ICD-10-CM | POA: Diagnosis not present

## 2022-03-26 DIAGNOSIS — L97519 Non-pressure chronic ulcer of other part of right foot with unspecified severity: Secondary | ICD-10-CM | POA: Diagnosis not present

## 2022-03-26 DIAGNOSIS — Z87891 Personal history of nicotine dependence: Secondary | ICD-10-CM | POA: Insufficient documentation

## 2022-03-26 DIAGNOSIS — I739 Peripheral vascular disease, unspecified: Secondary | ICD-10-CM

## 2022-03-26 DIAGNOSIS — R0609 Other forms of dyspnea: Secondary | ICD-10-CM | POA: Diagnosis not present

## 2022-03-26 DIAGNOSIS — N189 Chronic kidney disease, unspecified: Secondary | ICD-10-CM | POA: Insufficient documentation

## 2022-03-26 DIAGNOSIS — I2510 Atherosclerotic heart disease of native coronary artery without angina pectoris: Secondary | ICD-10-CM | POA: Insufficient documentation

## 2022-03-26 DIAGNOSIS — I872 Venous insufficiency (chronic) (peripheral): Secondary | ICD-10-CM | POA: Diagnosis not present

## 2022-03-26 DIAGNOSIS — E1122 Type 2 diabetes mellitus with diabetic chronic kidney disease: Secondary | ICD-10-CM | POA: Insufficient documentation

## 2022-03-26 DIAGNOSIS — G4730 Sleep apnea, unspecified: Secondary | ICD-10-CM | POA: Insufficient documentation

## 2022-03-26 DIAGNOSIS — E785 Hyperlipidemia, unspecified: Secondary | ICD-10-CM | POA: Diagnosis not present

## 2022-03-26 HISTORY — PX: ABDOMINAL AORTOGRAM W/LOWER EXTREMITY: CATH118223

## 2022-03-26 LAB — GLUCOSE, CAPILLARY
Glucose-Capillary: 229 mg/dL — ABNORMAL HIGH (ref 70–99)
Glucose-Capillary: 201 mg/dL — ABNORMAL HIGH (ref 70–99)

## 2022-03-26 SURGERY — ABDOMINAL AORTOGRAM W/LOWER EXTREMITY
Anesthesia: LOCAL

## 2022-03-26 MED ORDER — LIDOCAINE HCL (PF) 1 % IJ SOLN
INTRAMUSCULAR | Status: DC | PRN
  Administered 2022-03-26: 10 mL

## 2022-03-26 MED ORDER — HEPARIN (PORCINE) IN NACL 1000-0.9 UT/500ML-% IV SOLN
INTRAVENOUS | Status: DC | PRN
  Administered 2022-03-26 (×2): 500 mL

## 2022-03-26 MED ORDER — MIDAZOLAM HCL 2 MG/2ML IJ SOLN
INTRAMUSCULAR | Status: DC | PRN
  Administered 2022-03-26: 1 mg via INTRAVENOUS

## 2022-03-26 MED ORDER — HEPARIN (PORCINE) IN NACL 1000-0.9 UT/500ML-% IV SOLN
INTRAVENOUS | Status: AC
  Filled 2022-03-26: qty 1000

## 2022-03-26 MED ORDER — SODIUM CHLORIDE 0.9 % IV SOLN: 250.0000 mL | INTRAVENOUS | Status: DC | PRN

## 2022-03-26 MED ORDER — FENTANYL CITRATE (PF) 100 MCG/2ML IJ SOLN
INTRAMUSCULAR | Status: AC
  Filled 2022-03-26: qty 2

## 2022-03-26 MED ORDER — MIDAZOLAM HCL 2 MG/2ML IJ SOLN
INTRAMUSCULAR | Status: AC
  Filled 2022-03-26: qty 2

## 2022-03-26 MED ORDER — ACETAMINOPHEN 325 MG PO TABS: 650.0000 mg | ORAL_TABLET | ORAL | Status: DC | PRN

## 2022-03-26 MED ORDER — IODIXANOL 320 MG/ML IV SOLN
INTRAVENOUS | Status: DC | PRN
  Administered 2022-03-26: 65 mL

## 2022-03-26 MED ORDER — SODIUM CHLORIDE 0.9% FLUSH: 3.0000 mL | Freq: Two times a day (BID) | INTRAVENOUS | Status: DC

## 2022-03-26 MED ORDER — SODIUM CHLORIDE 0.9% FLUSH: 3.0000 mL | INTRAVENOUS | Status: DC | PRN

## 2022-03-26 MED ORDER — FENTANYL CITRATE (PF) 100 MCG/2ML IJ SOLN
INTRAMUSCULAR | Status: DC | PRN
Start: 2022-03-26 — End: 2022-03-26
  Administered 2022-03-26: 25 ug via INTRAVENOUS

## 2022-03-26 MED ORDER — HEPARIN SODIUM (PORCINE) 1000 UNIT/ML IJ SOLN
INTRAMUSCULAR | Status: AC
  Filled 2022-03-26: qty 10

## 2022-03-26 MED ORDER — SODIUM CHLORIDE 0.9 % IV SOLN: INTRAVENOUS | Status: DC

## 2022-03-26 MED ORDER — ONDANSETRON HCL 4 MG/2ML IJ SOLN: 4.0000 mg | Freq: Four times a day (QID) | INTRAMUSCULAR | Status: DC | PRN

## 2022-03-26 MED ORDER — LIDOCAINE HCL (PF) 1 % IJ SOLN
INTRAMUSCULAR | Status: AC
Start: 2022-03-26 — End: ?
  Filled 2022-03-26: qty 30

## 2022-03-26 MED ORDER — ASPIRIN 81 MG PO CHEW: 81.0000 mg | CHEWABLE_TABLET | ORAL | Status: DC

## 2022-03-26 SURGICAL SUPPLY — 15 items
CATH ANGIO 5F PIGTAIL 65CM (CATHETERS) ×1 IMPLANT
CATH CROSS OVER TEMPO 5F (CATHETERS) ×1 IMPLANT
CATH STRAIGHT 5FR 65CM (CATHETERS) ×1 IMPLANT
DEVICE CLOSURE MYNXGRIP 5F (Vascular Products) ×1 IMPLANT
GUIDEWIRE ANGLED .035X150CM (WIRE) ×1 IMPLANT
KIT MICROPUNCTURE NIT STIFF (SHEATH) ×1 IMPLANT
KIT PV (KITS) ×2 IMPLANT
SHEATH PINNACLE 5F 10CM (SHEATH) ×1 IMPLANT
SHEATH PROBE COVER 6X72 (BAG) ×1 IMPLANT
STOPCOCK MORSE 400PSI 3WAY (MISCELLANEOUS) ×1 IMPLANT
SYR MEDRAD MARK 7 150ML (SYRINGE) ×2 IMPLANT
TRANSDUCER W/STOPCOCK (MISCELLANEOUS) ×2 IMPLANT
TRAY PV CATH (CUSTOM PROCEDURE TRAY) ×2 IMPLANT
TUBING CIL FLEX 10 FLL-RA (TUBING) ×1 IMPLANT
WIRE HITORQ VERSACORE ST 145CM (WIRE) ×1 IMPLANT

## 2022-03-26 NOTE — Interval H&P Note (Signed)
History and Physical Interval Note:  03/26/2022 10:16 AM  Lawrence [name]  has presented today for surgery, with the diagnosis of pad.  The various methods of treatment have been discussed with the patient and family. After consideration of risks, benefits and other options for treatment, the patient has consented to  Procedure(s): ABDOMINAL AORTOGRAM W/LOWER EXTREMITY (N/A) as a surgical intervention.  The patient's history has been reviewed, patient examined, no change in status, stable for surgery.  I have reviewed the patient's chart and labs.  Questions were answered to the patient's satisfaction.     Kathlyn Sacramento

## 2022-03-26 NOTE — Progress Notes (Signed)
Up and walked and tolerated well; left groin stable, no bleeding or hematoma 

## 2022-03-27 MED FILL — Heparin Sodium (Porcine) Inj 1000 Unit/ML: INTRAMUSCULAR | Qty: 10 | Status: AC

## 2022-04-01 ENCOUNTER — Encounter: Payer: Self-pay | Admitting: Podiatry

## 2022-04-01 ENCOUNTER — Ambulatory Visit: Payer: Medicare Other | Admitting: Podiatry

## 2022-04-01 ENCOUNTER — Ambulatory Visit (INDEPENDENT_AMBULATORY_CARE_PROVIDER_SITE_OTHER): Payer: Medicare Other | Admitting: Podiatry

## 2022-04-01 DIAGNOSIS — L97512 Non-pressure chronic ulcer of other part of right foot with fat layer exposed: Secondary | ICD-10-CM

## 2022-04-01 DIAGNOSIS — E0843 Diabetes mellitus due to underlying condition with diabetic autonomic (poly)neuropathy: Secondary | ICD-10-CM

## 2022-04-01 MED ORDER — SULFAMETHOXAZOLE-TRIMETHOPRIM 800-160 MG PO TABS: 1.0000 | ORAL_TABLET | Freq: Two times a day (BID) | ORAL | 0 refills | Status: DC

## 2022-04-01 NOTE — Progress Notes (Signed)
Subjective:  71 y.o. male with PMHx of diabetes mellitus presenting today with his wife for follow-up evaluation of an ulcer to the right great toe.  Patient recently underwent aortogram with Dr. Fletcher Anon.  Presenting today for follow-up treatment and evaluation.  They have been applying Santyl ointment and dry dressings daily  Past Medical History:  Diagnosis Date   Allergies    Bronchitis    PMH   CAD (coronary artery disease)    07/2007   Diabetes (HCC)    Elevated lipids    Glaucoma    HBP (high blood pressure)    Headache    Myocardial infarction (HCC)    Neuropathy    Osteoarthritis    Skin cancer    Sleep apnea    CPAP   Wears glasses    Wears partial dentures    Past Surgical History:  Procedure Laterality Date   ABDOMINAL AORTOGRAM W/LOWER EXTREMITY N/A 03/26/2022   Procedure: ABDOMINAL AORTOGRAM W/LOWER EXTREMITY;  Surgeon: Wellington Hampshire, MD;  Location: [facility];  Service: Cardiovascular;  Laterality: N/A;   APPENDECTOMY     CARDIAC CATHETERIZATION N/A 06/11/2016   Procedure: Left Heart Cath and Coronary Angiography;  Surgeon: Corey Skains, MD;  Location: [facility];  Service: Cardiovascular;  Laterality: N/A;   CARPAL TUNNEL RELEASE Left 04/24/2016   Procedure: CARPAL TUNNEL RELEASE;  Surgeon: Hessie Knows, MD;  Location: [facility];  Service: Orthopedics;  Laterality: Left;   CATARACT EXTRACTION W/ INTRAOCULAR LENS  IMPLANT, BILATERAL Bilateral    CATARACT EXTRACTION, BILATERAL     R eye 07/16/12, L eye 08/13/12 - with lens implant in both eyes   CORONARY ARTERY BYPASS GRAFT N/A 06/27/2016   Procedure: CORONARY ARTERY BYPASS GRAFTING times four using left internal mammary artery and right leg saphenous vein;  Surgeon: Ivin Poot, MD;  Location: [facility];  Service: Open Heart Surgery;  Laterality: N/A;   EYE SURGERY Bilateral    JOINT REPLACEMENT     KNEE ARTHROPLASTY Left 02/11/2016   Procedure: COMPUTER ASSISTED TOTAL KNEE ARTHROPLASTY;   Surgeon: Dereck Leep, MD;  Location: [facility];  Service: Orthopedics;  Laterality: Left;   KNEE ARTHROSCOPY Right    LEFT HEART CATH AND CORS/GRAFTS ANGIOGRAPHY Left 01/13/2017   Procedure: Left Heart Cath and Cors/Grafts Angiography;  Surgeon: Corey Skains, MD;  Location: [place] [facility];  Service: Cardiovascular;  Laterality: Left;   METATARSAL HEAD EXCISION Right 03/21/2020   Procedure: METATARSAL HEAD RESECTION RIGHT AND DEBRIDEMENT OF ULCER;  Surgeon: [name], DPM;  Location: [facility];  Service: Podiatry;  Laterality: Right;   MULTIPLE TOOTH EXTRACTIONS     TEE WITHOUT CARDIOVERSION N/A 06/27/2016   Procedure: TRANSESOPHAGEAL ECHOCARDIOGRAM (TEE);  Surgeon: Ivin Poot, MD;  Location: [facility];  Service: Open Heart Surgery;  Laterality: N/A;   Allergies  Allergen Reactions   Nitroglycerin Nausea Only and Other (See Comments)    Patches only - headache    Statins Other (See Comments)    BODY PAIN, Pt taking Crestor*      Fluvastatin Sodium     Other reaction(s): Myalgias   Rosuvastatin     Other reaction(s): Muscle pain, Myalgias   Simvastatin     Other reaction(s): Muscle pain, Myalgias   Ezetimibe Other (See Comments)    Other reaction(s): Muscle Pain   Lipitor [Atorvastatin] Other (See Comments) and Rash    BODY PAIN   Lisinopril Other (See Comments) and Nausea  Only    Light headed, dizzy Other reaction(s): Dizziness           Objective/Physical Exam General: The patient is alert and oriented x3 in no acute distress.  Dermatology:  Wound #1 noted to the encompassing the right hallux.  Dorsal wound measures approximately 1.0 x 2.0 x 0.1 cm.  Plantar wound measures approximately 1.0 x 2.0 x 0.1 cm (LxWxD).  Please see above noted photos  To the noted ulceration(s), there is no eschar. There is a moderate amount of slough, fibrin, and necrotic tissue noted especially to the plantar foot wound. Granulation tissue and wound base is red to the dorsal foot  wound. There is a minimal amount of serosanguineous drainage noted. There is no exposed bone muscle-tendon ligament or joint. There is no malodor. Periwound integrity is intact. Skin is warm, dry and supple bilateral lower extremities.  Vascular:  VAS [place] LE ART SEG MULTI 03/18/2022 Right: Resting right ankle-brachial index indicates moderate right lower extremity arterial disease.  The right toe brachial index is abnormal. Left: Resting left ankle-brachial index indicates mild left lower extremity arterial disease.  The left toe brachial index is normal.  VAS [place] LOWER EXTREMITY ARTERIAL BILATERAL 03/18/2022 Right: Total occlusion noted in the popliteal artery.  ABDOMINAL AORTOGRAM W/ LOWER EXTREMITY 03/26/2022 1.  No significant aortoiliac disease. 2.  Right lower extremity: No significant SFA or popliteal disease.  Two-vessel runoff below the knee via the peroneal and anterior tibial arteries.  The posterior tibial artery is diffusely diseased and occluded above the ankle.  However, the pedal arch is intact with excellent antegrade flow provided by the anterior tibial artery. Recommendations: The patient seems to have good inline flow to the right big toe.  No indication for revascularization.  Neurological: Light touch and protective threshold absent bilaterally.   Musculoskeletal Exam: History of prior toe amputation LT foot.  Radiographic exam RT foot 03/14/2022: Normal osseous mineralization.  No cortical irregularities or erosions concerning for osteomyelitis.  Assessment: 1.  Ulcer right hallux secondary to diabetes mellitus 2. diabetes mellitus w/ peripheral neuropathy   Plan of Care:  1. Patient was evaluated.  Lower extremity ABIs reviewed.  unfortunately the wound has deteriorated more since last visit on 03/14/2022. 2. medically necessary excisional debridement including subcutaneous tissue was performed using a tissue nipper and a chisel blade. Excisional debridement of all the  necrotic nonviable tissue down to healthy bleeding viable tissue was performed with post-debridement measurements same as pre-. 3. the wound was cleansed and dry sterile dressing applied. 4.  Discontinue Santyl.  Recommend Betadine with dry sterile dressing 5.  Continue postsurgical shoe 6.  Prescription for Bactrim DS #20  7.  Patient is to return to clinic in 2 weeks.  If there is no significant improvement we may need to order an MRI   [name], DPM [facility]  Dr. [name], DPM    [address], [place] [address]                Office [phone]  Fax [phone]

## 2022-04-08 NOTE — Progress Notes (Unsigned)
Office Visit    Patient Name: Lawrence [name] Date of Encounter: 04/09/2022  Primary Care Provider:  Wellington Hampshire, MD Primary Cardiologist:  None Primary Electrophysiologist: None  Chief Complaint    Lawrence [name] is a 71 y.o. male with PMH of CAD s/p CABG 2017 with occluded RCA graft 2018, HTN, sleep apnea, CPAP, HLD DM II, CKD, carotid artery disease, chronic venous insufficiency, PAD, chronic exertional dyspnea who presents today for aortogram follow-up.  Past Medical History    Past Medical History:  Diagnosis Date   Allergies    Bronchitis    PMH   CAD (coronary artery disease)    07/2007   Diabetes (HCC)    Elevated lipids    Glaucoma    HBP (high blood pressure)    Headache    Myocardial infarction (HCC)    Neuropathy    Osteoarthritis    PAD (peripheral artery disease) ([place])    a. 03/2022 Lower Ext Angio: No signif Ao-iliac dzs. R PT diff dzs and occluded above ankle. R Pedal arch intact w/ excellent antegrade flow provided by R AT-->no indication for revasc.   Skin cancer    Skin ulcer of right great toe, unspecified ulcer stage ([place])    Sleep apnea    CPAP   Wears glasses    Wears partial dentures    Past Surgical History:  Procedure Laterality Date   ABDOMINAL AORTOGRAM W/LOWER EXTREMITY N/A 03/26/2022   Procedure: ABDOMINAL AORTOGRAM W/LOWER EXTREMITY;  Surgeon: Wellington Hampshire, MD;  Location: [facility];  Service: Cardiovascular;  Laterality: N/A;   APPENDECTOMY     CARDIAC CATHETERIZATION N/A 06/11/2016   Procedure: Left Heart Cath and Coronary Angiography;  Surgeon: Corey Skains, MD;  Location: [facility];  Service: Cardiovascular;  Laterality: N/A;   CARPAL TUNNEL RELEASE Left 04/24/2016   Procedure: CARPAL TUNNEL RELEASE;  Surgeon: Hessie Knows, MD;  Location: [facility];  Service: Orthopedics;  Laterality: Left;   CATARACT EXTRACTION W/ INTRAOCULAR LENS  IMPLANT, BILATERAL Bilateral    CATARACT EXTRACTION, BILATERAL     R  eye 07/16/12, L eye 08/13/12 - with lens implant in both eyes   CORONARY ARTERY BYPASS GRAFT N/A 06/27/2016   Procedure: CORONARY ARTERY BYPASS GRAFTING times four using left internal mammary artery and right leg saphenous vein;  Surgeon: Ivin Poot, MD;  Location: [place] [facility];  Service: Open Heart Surgery;  Laterality: N/A;   EYE SURGERY Bilateral    JOINT REPLACEMENT     KNEE ARTHROPLASTY Left 02/11/2016   Procedure: COMPUTER ASSISTED TOTAL KNEE ARTHROPLASTY;  Surgeon: Dereck Leep, MD;  Location: [facility];  Service: Orthopedics;  Laterality: Left;   KNEE ARTHROSCOPY Right    LEFT HEART CATH AND CORS/GRAFTS ANGIOGRAPHY Left 01/13/2017   Procedure: Left Heart Cath and Cors/Grafts Angiography;  Surgeon: Corey Skains, MD;  Location: [place] [facility];  Service: Cardiovascular;  Laterality: Left;   METATARSAL HEAD EXCISION Right 03/21/2020   Procedure: METATARSAL HEAD RESECTION RIGHT AND DEBRIDEMENT OF ULCER;  Surgeon: [name], DPM;  Location: [place] [facility];  Service: Podiatry;  Laterality: Right;   MULTIPLE TOOTH EXTRACTIONS     TEE WITHOUT CARDIOVERSION N/A 06/27/2016   Procedure: TRANSESOPHAGEAL ECHOCARDIOGRAM (TEE);  Surgeon: Ivin Poot, MD;  Location: [place];  Service: Open Heart Surgery;  Laterality: N/A;    Allergies  Allergies  Allergen Reactions   Nitroglycerin Nausea Only and Other (See Comments)  Patches only - headache    Statins Other (See Comments)    BODY PAIN, Pt taking Crestor*      Fluvastatin Sodium     Other reaction(s): Myalgias   Rosuvastatin     Other reaction(s): Muscle pain, Myalgias   Simvastatin     Other reaction(s): Muscle pain, Myalgias   Ezetimibe Other (See Comments)    Other reaction(s): Muscle Pain   Lipitor [Atorvastatin] Other (See Comments) and Rash    BODY PAIN   Lisinopril Other (See Comments) and Nausea Only    Light headed, dizzy Other reaction(s): Dizziness    History of Present Illness    Lawrence [name] is a 72 year old  male with the above-mentioned past medical history who presents today for follow-up regarding aortogram performed on 03/26/2022. Initially was seen by Dr. Fletcher Anon for consultation on 03/18/22 following a referral for ulcerated right great toe that remains unhealed.  He underwent vascular testing which showed ABI of 0.75 on the right and 0.87 on the left.  His right great toe pressure was significantly reduced at 44 and duplex showed occluded mid distal popliteal artery.  Patient has previous amputation the left second toe due to osteomyelitis.  Abdominal aortogram revealed no significant aortic disease, or SFA or popliteal disease.  The posterior tibial artery is diffusely diseased with occlusion above the ankle.  There was no indication for revascularization at that time.  Since last being seen in the office patient reports that he is doing well since his aortogram last month.  He is currently seeing a podiatrist in addition to his primary cardiologist Dr. Nehemiah Massed.  Currently podiatry is treating his ulcerated right toe with antibiotics and Betadine dressing.  Mr. Letourneau stated that the plan moving forward is to obtain an MRI following course of antibiotics.  Ultimately if his toe is not healing he will be referred to surgery for possible amputation.  Patient denies chest pain, palpitations, dyspnea, PND, orthopnea, nausea, vomiting, dizziness, syncope, edema, weight gain, or early satiety.  Home Medications    Current Outpatient Medications  Medication Sig Dispense Refill   AIMOVIG 140 MG/ML SOAJ Inject 140 mg into the skin every 30 (thirty) days.     aspirin 81 MG EC tablet Take 81 mg by mouth daily. Swallow whole.     Calcium-Magnesium-Vitamin D (CALCIUM 1200+D3 PO) Take 1 tablet by mouth daily. D3 25 mcg     cholecalciferol (VITAMIN D) 1000 units tablet Take 2,000 Units by mouth in the morning and at bedtime.      clopidogrel (PLAVIX) 75 MG tablet Take 1 tablet (75 mg total) by mouth daily. 30 tablet 0    famotidine (PEPCID) 20 MG tablet Take 1 tablet (20 mg total) by mouth daily. 30 tablet 0   fluticasone (FLONASE) 50 MCG/ACT nasal spray Place 1 spray into both nostrils daily as needed for allergies.     furosemide (LASIX) 40 MG tablet Take 1 tablet (40 mg total) by mouth daily. 30 tablet 1   glucagon 1 MG injection Inject 1 mg into the vein once as needed. Reported on 02/11/2016     GLUTOSE 15 40 % GEL 1 Tube once as needed for low blood sugar.     insulin aspart (NOVOLOG) 100 UNIT/ML injection Inject 20 Units into the skin 3 (three) times daily. (Patient taking differently: Inject 10-20 Units into the skin 3 (three) times daily with meals. Sliding Scale) 10 mL 5   insulin glargine-yfgn (SEMGLEE) 100 UNIT/ML injection 20 Units at  bedtime.     isosorbide mononitrate (IMDUR) 60 MG 24 hr tablet Take 60 mg by mouth every evening.     loratadine (CLARITIN) 10 MG tablet Take 10 mg by mouth daily.     magnesium oxide (MAG-OX) 400 MG tablet Take 400 mg by mouth daily.     metoprolol tartrate (LOPRESSOR) 25 MG tablet Take 25 mg by mouth 2 (two) times daily.     nitroGLYCERIN (NITROSTAT) 0.4 MG SL tablet DISSOLVE 1 TABLET UNDER THE TONGUE EVERY 5 MINUTES AS NEEDED FOR CHEST PAIN 30 tablet 0   potassium chloride SA (K-DUR,KLOR-CON) 20 MEQ tablet Take 1 tablet (20 mEq total) by mouth daily. 30 tablet 1   raNITIdine HCl (ACID REDUCER PO) Take 1 tablet by mouth daily.     ranolazine (RANEXA) 1000 MG SR tablet Take 1,000 mg by mouth 2 (two) times daily.     rosuvastatin (CRESTOR) 20 MG tablet Take 1 tablet (20 mg total) by mouth daily at 6 PM. 30 tablet 1   sulfamethoxazole-trimethoprim (BACTRIM DS) 800-160 MG tablet Take 1 tablet by mouth 2 (two) times daily. 20 tablet 0   Travoprost, BAK Free, (TRAVATAN) 0.004 % SOLN ophthalmic solution Place 1 drop into both eyes at bedtime.      triamcinolone ointment (KENALOG) 0.1 % Apply 1 application. topically daily as needed (rash).     vitamin C (ASCORBIC ACID) 500  MG tablet Take 500 mg by mouth 2 (two) times daily.      collagenase (SANTYL) 250 UNIT/GM ointment Apply 1 application. topically daily. (Patient not taking: Reported on 04/09/2022)     No current facility-administered medications for this visit.     Review of Systems  Please see the history of present illness.    (+) Wound on the right great toe   All other systems reviewed and are otherwise negative except as noted above.  Physical Exam    Wt Readings from Last 3 Encounters:  04/09/22 (!) 312 lb (141.5 kg)  03/18/22 (!) 313 lb 2 oz (142 kg)  12/26/21 (!) 316 lb (143.3 kg)   VS: Vitals:   04/09/22 0957  BP: 130/60  Pulse: (!) 58  SpO2: 98%  ,Body mass index is 44.77 kg/m.  Constitutional:      Appearance: Healthy appearance. Not in distress.  Neck:     Vascular: JVD normal.  Pulmonary:     Effort: Pulmonary effort is normal.     Breath sounds: No wheezing. No rales. Diminished in the bases Cardiovascular:     Normal rate. Regular rhythm. Normal S1. Normal S2.      Murmurs: There is no murmur left groin site intact with no hematoma , ecchymosis or bruit auscultated. Edema:    Peripheral edema absent.  Abdominal:     Palpations: Abdomen is soft non tender. There is no hepatomegaly.  Skin:    General: Skin is warm and dry.  Neurological:     General: No focal deficit present.     Mental Status: Alert and oriented to person, place and time.     Cranial Nerves: Cranial nerves are intact.  EKG/LABS/Other Studies Reviewed    ECG personally reviewed by me today -none completed today  Lab Results  Component Value Date   WBC 5.3 03/24/2022   HGB 11.8 (L) 03/24/2022   HCT 36.0 (L) 03/24/2022   MCV 98.1 03/24/2022   PLT 227 03/24/2022   Lab Results  Component Value Date   CREATININE 1.27 (H) 03/24/2022  BUN 26 (H) 03/24/2022   NA 136 03/24/2022   K 4.7 03/24/2022   CL 101 03/24/2022   CO2 28 03/24/2022   Lab Results  Component Value Date   ALT 9 02/25/2022    AST 12 02/25/2022   ALKPHOS 119 02/25/2022   BILITOT 0.7 02/25/2022   Lab Results  Component Value Date   CHOL 136 04/22/2019   HDL 64.10 04/22/2019   LDLCALC 61 04/22/2019   TRIG 53.0 04/22/2019   CHOLHDL 2 04/22/2019    Lab Results  Component Value Date   HGBA1C 7.8 (H) 02/25/2022    Assessment & Plan    1.  Peripheral artery disease: -Patient presents today for follow-up after aortogram.  Patient states that he has done well following the procedure. -His groin site was intact with no signs of hematoma or ecchymosis. -Patient has primary cardiologist and any preoperative evaluation will be deferred to his primary cardiologist following today's appointment.  2.  Essential hypertension: -Blood pressure today was well controlled at 130/60. -Continue current regimen per his primary cardiologist  Disposition: Follow-up with None or APP in as needed     Medication Adjustments/Labs and Tests Ordered: Current medicines are reviewed at length with the patient today.  Concerns regarding medicines are outlined above.   Signed, Mable Fill, Marissa Nestle, NP 04/09/2022, 10:28 AM [place]

## 2022-04-09 ENCOUNTER — Encounter: Payer: Self-pay | Admitting: Nurse Practitioner

## 2022-04-09 ENCOUNTER — Ambulatory Visit (INDEPENDENT_AMBULATORY_CARE_PROVIDER_SITE_OTHER): Payer: Medicare Other | Admitting: Nurse Practitioner

## 2022-04-09 VITALS — BP 130/60 | HR 58 | Ht 70.0 in | Wt 312.0 lb

## 2022-04-09 DIAGNOSIS — I2510 Atherosclerotic heart disease of native coronary artery without angina pectoris: Secondary | ICD-10-CM

## 2022-04-09 DIAGNOSIS — I739 Peripheral vascular disease, unspecified: Secondary | ICD-10-CM | POA: Diagnosis not present

## 2022-04-09 NOTE — Patient Instructions (Signed)
Medication Instructions:  No changes at this time.   *If you need a refill on your cardiac medications before your next appointment, please call your pharmacy*   Lab Work: None  If you have labs (blood work) drawn today and your tests are completely normal, you will receive your results only by: MyChart Message (if you have MyChart) OR A paper copy in the mail If you have any lab test that is abnormal or we need to change your treatment, we will call you to review the results.   Testing/Procedures: None   Follow-Up: At [facility], you and your health needs are our priority.  As part of our continuing mission to provide you with exceptional heart care, we have created designated Provider Care Teams.  These Care Teams include your primary Cardiologist (physician) and Advanced Practice Providers (APPs -  Physician Assistants and Nurse Practitioners) who all work together to provide you with the care you need, when you need it.   Your next appointment:   Follow up as needed.     Important Information About Sugar       

## 2022-04-15 ENCOUNTER — Ambulatory Visit (INDEPENDENT_AMBULATORY_CARE_PROVIDER_SITE_OTHER): Payer: Medicare Other | Admitting: Podiatry

## 2022-04-15 DIAGNOSIS — L97512 Non-pressure chronic ulcer of other part of right foot with fat layer exposed: Secondary | ICD-10-CM

## 2022-04-15 NOTE — Progress Notes (Signed)
Subjective:  71 y.o. male with PMHx of diabetes mellitus presenting today with his wife for follow-up evaluation of an ulcer to the right great toe.  Patient recently underwent aortogram with Dr. Fletcher Anon.  Presenting today for follow-up treatment and evaluation.  They have been applying Santyl ointment and dry dressings daily  Past Medical History:  Diagnosis Date   Allergies    Bronchitis    PMH   CAD (coronary artery disease)    07/2007   Diabetes (HCC)    Elevated lipids    Glaucoma    HBP (high blood pressure)    Headache    Myocardial infarction (HCC)    Neuropathy    Osteoarthritis    PAD (peripheral artery disease) ([place])    a. 03/2022 Lower Ext Angio: No signif Ao-iliac dzs. R PT diff dzs and occluded above ankle. R Pedal arch intact w/ excellent antegrade flow provided by R AT-->no indication for revasc.   Skin cancer    Skin ulcer of right great toe, unspecified ulcer stage ([place])    Sleep apnea    CPAP   Wears glasses    Wears partial dentures    Past Surgical History:  Procedure Laterality Date   ABDOMINAL AORTOGRAM W/LOWER EXTREMITY N/A 03/26/2022   Procedure: ABDOMINAL AORTOGRAM W/LOWER EXTREMITY;  Surgeon: Wellington Hampshire, MD;  Location: [facility];  Service: Cardiovascular;  Laterality: N/A;   APPENDECTOMY     CARDIAC CATHETERIZATION N/A 06/11/2016   Procedure: Left Heart Cath and Coronary Angiography;  Surgeon: Corey Skains, MD;  Location: [facility];  Service: Cardiovascular;  Laterality: N/A;   CARPAL TUNNEL RELEASE Left 04/24/2016   Procedure: CARPAL TUNNEL RELEASE;  Surgeon: Hessie Knows, MD;  Location: [facility];  Service: Orthopedics;  Laterality: Left;   CATARACT EXTRACTION W/ INTRAOCULAR LENS  IMPLANT, BILATERAL Bilateral    CATARACT EXTRACTION, BILATERAL     R eye 07/16/12, L eye 08/13/12 - with lens implant in both eyes   CORONARY ARTERY BYPASS GRAFT N/A 06/27/2016   Procedure: CORONARY ARTERY BYPASS GRAFTING times four using left  internal mammary artery and right leg saphenous vein;  Surgeon: Ivin Poot, MD;  Location: [facility] [facility];  Service: Open Heart Surgery;  Laterality: N/A;   EYE SURGERY Bilateral    JOINT REPLACEMENT     KNEE ARTHROPLASTY Left 02/11/2016   Procedure: COMPUTER ASSISTED TOTAL KNEE ARTHROPLASTY;  Surgeon: Dereck Leep, MD;  Location: [facility];  Service: Orthopedics;  Laterality: Left;   KNEE ARTHROSCOPY Right    LEFT HEART CATH AND CORS/GRAFTS ANGIOGRAPHY Left 01/13/2017   Procedure: Left Heart Cath and Cors/Grafts Angiography;  Surgeon: Corey Skains, MD;  Location: [facility];  Service: Cardiovascular;  Laterality: Left;   METATARSAL HEAD EXCISION Right 03/21/2020   Procedure: METATARSAL HEAD RESECTION RIGHT AND DEBRIDEMENT OF ULCER;  Surgeon: [name], DPM;  Location: [facility];  Service: Podiatry;  Laterality: Right;   MULTIPLE TOOTH EXTRACTIONS     TEE WITHOUT CARDIOVERSION N/A 06/27/2016   Procedure: TRANSESOPHAGEAL ECHOCARDIOGRAM (TEE);  Surgeon: Ivin Poot, MD;  Location: [facility];  Service: Open Heart Surgery;  Laterality: N/A;   Allergies  Allergen Reactions   Nitroglycerin Nausea Only and Other (See Comments)    Patches only - headache    Statins Other (See Comments)    BODY PAIN, Pt taking Crestor*      Fluvastatin Sodium     Other reaction(s): Myalgias   Rosuvastatin  Other reaction(s): Muscle pain, Myalgias   Simvastatin     Other reaction(s): Muscle pain, Myalgias   Ezetimibe Other (See Comments)    Other reaction(s): Muscle Pain   Lipitor [Atorvastatin] Other (See Comments) and Rash    BODY PAIN   Lisinopril Other (See Comments) and Nausea Only    Light headed, dizzy Other reaction(s): Dizziness       Objective/Physical Exam General: The patient is alert and oriented x3 in no acute distress.  Dermatology:  Right great toe wound noted to the distal tip which measures approximately 2.0 x 2.5 x 0.3 cm.  There is no exposed bone muscle tendon  ligament or joint.  There is an extensive amount of necrotic tissue noted.  Please see above noted photo.  Vascular:  VAS [place] LE ART SEG MULTI 03/18/2022 Right: Resting right ankle-brachial index indicates moderate right lower extremity arterial disease.  The right toe brachial index is abnormal. Left: Resting left ankle-brachial index indicates mild left lower extremity arterial disease.  The left toe brachial index is normal.  VAS [place] LOWER EXTREMITY ARTERIAL BILATERAL 03/18/2022 Right: Total occlusion noted in the popliteal artery.  ABDOMINAL AORTOGRAM W/ LOWER EXTREMITY 03/26/2022 1.  No significant aortoiliac disease. 2.  Right lower extremity: No significant SFA or popliteal disease.  Two-vessel runoff below the knee via the peroneal and anterior tibial arteries.  The posterior tibial artery is diffusely diseased and occluded above the ankle.  However, the pedal arch is intact with excellent antegrade flow provided by the anterior tibial artery. Recommendations: The patient seems to have good inline flow to the right big toe.  No indication for revascularization.  Neurological: Light touch and protective threshold absent bilaterally.   Musculoskeletal Exam: History of prior toe amputation LT foot.  Radiographic exam RT foot 03/14/2022: Normal osseous mineralization.  No cortical irregularities or erosions concerning for osteomyelitis.  Assessment: 1.  Ulcer right hallux secondary to diabetes mellitus 2. diabetes mellitus w/ peripheral neuropathy   Plan of Care:  1. Patient was evaluated.  Lower extremity ABIs reviewed.  2. medically necessary excisional debridement including subcutaneous tissue was performed using a tissue nipper and a chisel blade. Excisional debridement of all the necrotic nonviable tissue down to healthy bleeding viable tissue was performed with post-debridement measurements same as pre-. 3. the wound was cleansed and dry sterile dressing applied. 4.  Continue  Betadine with dry sterile dressing 5.  Continue postsurgical shoe 6.  MRI ordered toes right wo contrast 7.  Patient is to return to clinic in 2 weeks to review MRI results and discuss different options which may likely include partial toe amputation   [name], DPM [facility]  Dr. [name], DPM    [address], [place] [address]                Office [phone]  Fax [phone]

## 2022-04-21 ENCOUNTER — Telehealth: Payer: Self-pay | Admitting: *Deleted

## 2022-04-21 NOTE — Telephone Encounter (Signed)
I called Lawrence [name] and asked him to call [facility] to schedule his MRI of the right foot without contrast, at [phone].

## 2022-04-28 ENCOUNTER — Other Ambulatory Visit: Payer: Self-pay | Admitting: Cardiovascular Disease

## 2022-04-28 DIAGNOSIS — I739 Peripheral vascular disease, unspecified: Secondary | ICD-10-CM

## 2022-04-29 ENCOUNTER — Ambulatory Visit
Admission: RE | Admit: 2022-04-29 | Discharge: 2022-04-29 | Disposition: A | Discharge status: Home / Self Care | Payer: Medicare Other | Source: Ambulatory Visit | Attending: Podiatry | Admitting: Podiatry

## 2022-04-29 DIAGNOSIS — S91101A Unspecified open wound of right great toe without damage to nail, initial encounter: Secondary | ICD-10-CM | POA: Diagnosis not present

## 2022-04-29 DIAGNOSIS — L97512 Non-pressure chronic ulcer of other part of right foot with fat layer exposed: Secondary | ICD-10-CM | POA: Diagnosis not present

## 2022-05-09 ENCOUNTER — Ambulatory Visit (INDEPENDENT_AMBULATORY_CARE_PROVIDER_SITE_OTHER): Payer: Medicare Other | Admitting: Podiatry

## 2022-05-09 ENCOUNTER — Encounter: Payer: Self-pay | Admitting: Podiatry

## 2022-05-09 DIAGNOSIS — L97512 Non-pressure chronic ulcer of other part of right foot with fat layer exposed: Secondary | ICD-10-CM | POA: Diagnosis not present

## 2022-05-09 NOTE — Progress Notes (Signed)
Subjective:  71 y.o. male with PMHx of diabetes mellitus presenting today with his wife for follow-up evaluation of an ulcer to the right great toe.  Patient presents today to review the MRI results.  He says that the wound has deteriorated since last visit.  Past Medical History:  Diagnosis Date   Allergies    Bronchitis    PMH   CAD (coronary artery disease)    07/2007   Diabetes (HCC)    Elevated lipids    Glaucoma    HBP (high blood pressure)    Headache    Myocardial infarction (HCC)    Neuropathy    Osteoarthritis    PAD (peripheral artery disease) ([place])    a. 03/2022 Lower Ext Angio: No signif Ao-iliac dzs. R PT diff dzs and occluded above ankle. R Pedal arch intact w/ excellent antegrade flow provided by R AT-->no indication for revasc.   Skin cancer    Skin ulcer of right great toe, unspecified ulcer stage ([place])    Sleep apnea    CPAP   Wears glasses    Wears partial dentures    Past Surgical History:  Procedure Laterality Date   ABDOMINAL AORTOGRAM W/LOWER EXTREMITY N/A 03/26/2022   Procedure: ABDOMINAL AORTOGRAM W/LOWER EXTREMITY;  Surgeon: Wellington Hampshire, MD;  Location: [facility];  Service: Cardiovascular;  Laterality: N/A;   APPENDECTOMY     CARDIAC CATHETERIZATION N/A 06/11/2016   Procedure: Left Heart Cath and Coronary Angiography;  Surgeon: Corey Skains, MD;  Location: [facility];  Service: Cardiovascular;  Laterality: N/A;   CARPAL TUNNEL RELEASE Left 04/24/2016   Procedure: CARPAL TUNNEL RELEASE;  Surgeon: Hessie Knows, MD;  Location: [facility];  Service: Orthopedics;  Laterality: Left;   CATARACT EXTRACTION W/ INTRAOCULAR LENS  IMPLANT, BILATERAL Bilateral    CATARACT EXTRACTION, BILATERAL     R eye 07/16/12, L eye 08/13/12 - with lens implant in both eyes   CORONARY ARTERY BYPASS GRAFT N/A 06/27/2016   Procedure: CORONARY ARTERY BYPASS GRAFTING times four using left internal mammary artery and right leg saphenous vein;  Surgeon: Ivin Poot, MD;  Location: [facility];  Service: Open Heart Surgery;  Laterality: N/A;   EYE SURGERY Bilateral    JOINT REPLACEMENT     KNEE ARTHROPLASTY Left 02/11/2016   Procedure: COMPUTER ASSISTED TOTAL KNEE ARTHROPLASTY;  Surgeon: Dereck Leep, MD;  Location: [facility];  Service: Orthopedics;  Laterality: Left;   KNEE ARTHROSCOPY Right    LEFT HEART CATH AND CORS/GRAFTS ANGIOGRAPHY Left 01/13/2017   Procedure: Left Heart Cath and Cors/Grafts Angiography;  Surgeon: Corey Skains, MD;  Location: [facility];  Service: Cardiovascular;  Laterality: Left;   METATARSAL HEAD EXCISION Right 03/21/2020   Procedure: METATARSAL HEAD RESECTION RIGHT AND DEBRIDEMENT OF ULCER;  Surgeon: [name], DPM;  Location: [facility];  Service: Podiatry;  Laterality: Right;   MULTIPLE TOOTH EXTRACTIONS     TEE WITHOUT CARDIOVERSION N/A 06/27/2016   Procedure: TRANSESOPHAGEAL ECHOCARDIOGRAM (TEE);  Surgeon: Ivin Poot, MD;  Location: [facility];  Service: Open Heart Surgery;  Laterality: N/A;   Allergies  Allergen Reactions   Nitroglycerin Nausea Only and Other (See Comments)    Patches only - headache    Statins Other (See Comments)    BODY PAIN, Pt taking Crestor*      Fluvastatin Sodium     Other reaction(s): Myalgias   Rosuvastatin     Other reaction(s): Muscle pain,  Myalgias   Simvastatin     Other reaction(s): Muscle pain, Myalgias   Ezetimibe Other (See Comments)    Other reaction(s): Muscle Pain   Lipitor [Atorvastatin] Other (See Comments) and Rash    BODY PAIN   Lisinopril Other (See Comments) and Nausea Only    Light headed, dizzy Other reaction(s): Dizziness         Objective/Physical Exam General: The patient is alert and oriented x3 in no acute distress.  Dermatology:  Right great toe wound noted to the distal tip which measures approximately 2.0 x 2.5 x 0.3 cm.  There is no exposed bone muscle tendon ligament or joint.  There is an extensive amount of necrotic tissue  noted.  Please see above noted photo.  Vascular:  VAS [place] LE ART SEG MULTI 03/18/2022 Right: Resting right ankle-brachial index indicates moderate right lower extremity arterial disease.  The right toe brachial index is abnormal. Left: Resting left ankle-brachial index indicates mild left lower extremity arterial disease.  The left toe brachial index is normal.  VAS [place] LOWER EXTREMITY ARTERIAL BILATERAL 03/18/2022 Right: Total occlusion noted in the popliteal artery.  ABDOMINAL AORTOGRAM W/ LOWER EXTREMITY 03/26/2022 1.  No significant aortoiliac disease. 2.  Right lower extremity: No significant SFA or popliteal disease.  Two-vessel runoff below the knee via the peroneal and anterior tibial arteries.  The posterior tibial artery is diffusely diseased and occluded above the ankle.  However, the pedal arch is intact with excellent antegrade flow provided by the anterior tibial artery. Recommendations: The patient seems to have good inline flow to the right big toe.  No indication for revascularization.  Neurological: Light touch and protective threshold absent bilaterally.   Musculoskeletal Exam: History of prior toe amputation LT foot.  Radiographic exam RT foot 03/14/2022: Normal osseous mineralization.  No cortical irregularities or erosions concerning for osteomyelitis.  MR TOES RIGHT WO CONTRAST 04/29/2022 IMPRESSION: 1. Soft tissue wound of the great toe. Severe bone marrow edema of the great toe with bone destruction of the tuft consistent with osteomyelitis. No drainable fluid collection to suggest an abscess. 2. Severe soft tissue edema along the dorsal aspect of the foot which may be reactive versus secondary to cellulitis.  Assessment: 1.  Ulcer right hallux secondary to diabetes mellitus with underlying osteomyelitis; acute 2. diabetes mellitus w/ peripheral neuropathy   Plan of Care:  1. Patient was evaluated.  MRI reviewed.  2.  Based on MRI findings and deterioration of  the wound I did recommend amputation of the right great toe today.  Patient understands and agrees.  All possible complications and details the procedure were explained.  No guarantees were expressed or implied.  Verbal and written consent obtained today. 3.  Authorization for surgery was initiated today.  Surgery would consist of right great toe amputation 4.  In the meantime continue the postsurgical shoe and Betadine wet-to-dry dressings daily  5.  Return to clinic 1 week postop  [name], DPM [facility]  Dr. [name], DPM    [address], [place] [address]                Office [phone]  Fax [phone]

## 2022-05-13 ENCOUNTER — Telehealth: Payer: Self-pay

## 2022-05-13 NOTE — Telephone Encounter (Signed)
Spoke to patient by telephone and was advised that he was scheduled for surgery today and it was cancelled because the anesthesiologist needed records from cardiology. Patient stated that his cardiologist has now cleared him for surgery. Patient stated that surgery is back on for Friday and does not need Allie Bossier NP to do anything. Patient stated that it has been a while since he has seen Anda Kraft and request an appointment in August. Appointment scheduled for 06/18/22 at 11:00 am.

## 2022-05-13 NOTE — Telephone Encounter (Signed)
Pt called to see about getting a clearance from [facility] for a big toe removal surgery. He needs to have this clearance by this Friday, he does not have a PCP and he last saw Anda Kraft on 2.23.2023 for an OV. He would like to speak with the nurse first to see what to do about this, please advise and return a call back when possible. Thank you.  Callback Number: [phone]

## 2022-05-13 NOTE — Telephone Encounter (Signed)
Noted  

## 2022-05-14 ENCOUNTER — Other Ambulatory Visit: Payer: Self-pay | Admitting: Podiatry

## 2022-05-14 DIAGNOSIS — I96 Gangrene, not elsewhere classified: Secondary | ICD-10-CM | POA: Diagnosis not present

## 2022-05-14 DIAGNOSIS — L97512 Non-pressure chronic ulcer of other part of right foot with fat layer exposed: Secondary | ICD-10-CM | POA: Diagnosis not present

## 2022-05-14 DIAGNOSIS — M86171 Other acute osteomyelitis, right ankle and foot: Secondary | ICD-10-CM | POA: Diagnosis not present

## 2022-05-14 MED ORDER — TRAMADOL HCL 50 MG PO TABS: 50.0000 mg | ORAL_TABLET | ORAL | 0 refills | Status: AC | PRN

## 2022-05-14 MED ORDER — AMOXICILLIN-POT CLAVULANATE 875-125 MG PO TABS: 1.0000 | ORAL_TABLET | Freq: Two times a day (BID) | ORAL | 0 refills | Status: DC

## 2022-05-14 NOTE — Progress Notes (Signed)
PRN postop 

## 2022-05-15 ENCOUNTER — Other Ambulatory Visit: Payer: Medicare Other

## 2022-05-16 ENCOUNTER — Ambulatory Visit: Admit: 2022-05-16 | Payer: Medicare Other | Admitting: Podiatry

## 2022-05-16 SURGERY — AMPUTATION, TOE
Anesthesia: Choice | Site: Toe | Laterality: Right

## 2022-05-23 ENCOUNTER — Ambulatory Visit (INDEPENDENT_AMBULATORY_CARE_PROVIDER_SITE_OTHER): Payer: Medicare Other | Admitting: Podiatry

## 2022-05-23 DIAGNOSIS — Z9889 Other specified postprocedural states: Secondary | ICD-10-CM

## 2022-05-23 MED ORDER — DOXYCYCLINE HYCLATE 100 MG PO TABS: 100.0000 mg | ORAL_TABLET | Freq: Two times a day (BID) | ORAL | 0 refills | Status: DC

## 2022-05-23 NOTE — Progress Notes (Signed)
No chief complaint on file.   Subjective:  Patient presents today status post right great toe amputation. DOS: 05/15/2022.  Patient states that he is feeling well.  They have noticed a malodor over the last few days to the surgical site.  He has been minimally weightbearing in the postsurgical shoe as instructed.  Past Medical History:  Diagnosis Date   Allergies    Bronchitis    PMH   CAD (coronary artery disease)    07/2007   Diabetes (HCC)    Elevated lipids    Glaucoma    HBP (high blood pressure)    Headache    Myocardial infarction (HCC)    Neuropathy    Osteoarthritis    PAD (peripheral artery disease) ([place])    a. 03/2022 Lower Ext Angio: No signif Ao-iliac dzs. R PT diff dzs and occluded above ankle. R Pedal arch intact w/ excellent antegrade flow provided by R AT-->no indication for revasc.   Skin cancer    Skin ulcer of right great toe, unspecified ulcer stage ([place])    Sleep apnea    CPAP   Wears glasses    Wears partial dentures    Past Surgical History:  Procedure Laterality Date   ABDOMINAL AORTOGRAM W/LOWER EXTREMITY N/A 03/26/2022   Procedure: ABDOMINAL AORTOGRAM W/LOWER EXTREMITY;  Surgeon: Wellington Hampshire, MD;  Location: [facility];  Service: Cardiovascular;  Laterality: N/A;   APPENDECTOMY     CARDIAC CATHETERIZATION N/A 06/11/2016   Procedure: Left Heart Cath and Coronary Angiography;  Surgeon: Corey Skains, MD;  Location: [facility];  Service: Cardiovascular;  Laterality: N/A;   CARPAL TUNNEL RELEASE Left 04/24/2016   Procedure: CARPAL TUNNEL RELEASE;  Surgeon: Hessie Knows, MD;  Location: [facility];  Service: Orthopedics;  Laterality: Left;   CATARACT EXTRACTION W/ INTRAOCULAR LENS  IMPLANT, BILATERAL Bilateral    CATARACT EXTRACTION, BILATERAL     R eye 07/16/12, L eye 08/13/12 - with lens implant in both eyes   CORONARY ARTERY BYPASS GRAFT N/A 06/27/2016   Procedure: CORONARY ARTERY BYPASS GRAFTING times four using left internal  mammary artery and right leg saphenous vein;  Surgeon: Ivin Poot, MD;  Location: [place];  Service: Open Heart Surgery;  Laterality: N/A;   EYE SURGERY Bilateral    JOINT REPLACEMENT     KNEE ARTHROPLASTY Left 02/11/2016   Procedure: COMPUTER ASSISTED TOTAL KNEE ARTHROPLASTY;  Surgeon: Dereck Leep, MD;  Location: [facility];  Service: Orthopedics;  Laterality: Left;   KNEE ARTHROSCOPY Right    LEFT HEART CATH AND CORS/GRAFTS ANGIOGRAPHY Left 01/13/2017   Procedure: Left Heart Cath and Cors/Grafts Angiography;  Surgeon: Corey Skains, MD;  Location: [facility];  Service: Cardiovascular;  Laterality: Left;   METATARSAL HEAD EXCISION Right 03/21/2020   Procedure: METATARSAL HEAD RESECTION RIGHT AND DEBRIDEMENT OF ULCER;  Surgeon: [name], DPM;  Location: [facility];  Service: Podiatry;  Laterality: Right;   MULTIPLE TOOTH EXTRACTIONS     TEE WITHOUT CARDIOVERSION N/A 06/27/2016   Procedure: TRANSESOPHAGEAL ECHOCARDIOGRAM (TEE);  Surgeon: Ivin Poot, MD;  Location: [facility];  Service: Open Heart Surgery;  Laterality: N/A;   Allergies  Allergen Reactions   Nitroglycerin Nausea Only and Other (See Comments)    Patches only - headache    Statins Other (See Comments)    BODY PAIN, Pt taking Crestor*      Fluvastatin Sodium     Other reaction(s): Myalgias  Rosuvastatin     Other reaction(s): Muscle pain, Myalgias   Simvastatin     Other reaction(s): Muscle pain, Myalgias   Ezetimibe Other (See Comments)    Other reaction(s): Muscle Pain   Lipitor [Atorvastatin] Other (See Comments) and Rash    BODY PAIN   Lisinopril Other (See Comments) and Nausea Only    Light headed, dizzy Other reaction(s): Dizziness       Objective/Physical Exam Neurovascular status intact.  Skin incisions appear to be well coapted with sutures intact. No sign of infectious process noted. No dehiscence. No active bleeding noted.  Chronic bilateral lower extremity edema noted.  Assessment: 1.  s/p right great toe amputation. DOS: 05/15/2022   Plan of Care:  1. Patient was evaluated.  2.  Dressings changed.  Clean dry and intact x1 week 3.  Continue minimal weightbearing in the postsurgical shoe 4.  Patient states that the Augmentin is creating nausea and vomiting.  Discontinue. 5.  Prescription for doxycycline 100 mg 2 times daily #20 6.  Return to clinic 1 week  [name], DPM [facility]  Dr. [name], DPM    [address], [place] [address]                Office [phone]  Fax [phone]

## 2022-05-26 DIAGNOSIS — E113593 Type 2 diabetes mellitus with proliferative diabetic retinopathy without macular edema, bilateral: Secondary | ICD-10-CM | POA: Diagnosis not present

## 2022-05-26 DIAGNOSIS — H353221 Exudative age-related macular degeneration, left eye, with active choroidal neovascularization: Secondary | ICD-10-CM | POA: Diagnosis not present

## 2022-05-26 DIAGNOSIS — H02403 Unspecified ptosis of bilateral eyelids: Secondary | ICD-10-CM | POA: Diagnosis not present

## 2022-05-26 DIAGNOSIS — H401134 Primary open-angle glaucoma, bilateral, indeterminate stage: Secondary | ICD-10-CM | POA: Diagnosis not present

## 2022-05-26 LAB — HM DIABETES EYE EXAM

## 2022-05-30 ENCOUNTER — Ambulatory Visit (INDEPENDENT_AMBULATORY_CARE_PROVIDER_SITE_OTHER): Payer: Medicare Other | Admitting: Podiatry

## 2022-05-30 DIAGNOSIS — Z9889 Other specified postprocedural states: Secondary | ICD-10-CM

## 2022-05-30 NOTE — Patient Outreach (Signed)
  Care Coordination   Initial Visit Note   05/30/2022 Name: Lawrence [name] MRN: [id number] DOB: 07/07/1951  Lawrence [name] is a 71 y.o. year old male who sees Pleas Koch, NP for primary care. I spoke with  [name] by phone today  What matters to the patients health and wellness today? Patient declined service. Patient states, " everything is good right now.  I don't have any needs" Confirmed patient knows how/ when to follow up with provider for any new or ongoing concerns. Patient given contact number for RNCM in the event services needed in the future.    Goals Addressed   None     SDOH assessments and interventions completed:   No   Care Coordination Interventions Activated:  No Care Coordination Interventions:  No, not indicated  Follow up plan:  No further follow up needed due to patient declining services.  Encounter Outcome:  Pt. Refused  Quinn Plowman RN,BSN,CCM RN Care Manager Coordinator [place]  [phone]

## 2022-05-30 NOTE — Progress Notes (Signed)
Chief Complaint  Patient presents with   Post-op Follow-up    POV #2 DOS 05/14/2022 RT GREAT TOE AMPUTATION    Subjective:  Patient presents today status post right great toe amputation. DOS: 05/15/2022.  Patient continues to do well.  They have kept the dressings clean dry and intact for the past week.  He is still taking the doxycycline as prescribed.  No new complaints at this time  Past Medical History:  Diagnosis Date   Allergies    Bronchitis    PMH   CAD (coronary artery disease)    07/2007   Diabetes (HCC)    Elevated lipids    Glaucoma    HBP (high blood pressure)    Headache    Myocardial infarction (HCC)    Neuropathy    Osteoarthritis    PAD (peripheral artery disease) ([place])    a. 03/2022 Lower Ext Angio: No signif Ao-iliac dzs. R PT diff dzs and occluded above ankle. R Pedal arch intact w/ excellent antegrade flow provided by R AT-->no indication for revasc.   Skin cancer    Skin ulcer of right great toe, unspecified ulcer stage ([place])    Sleep apnea    CPAP   Wears glasses    Wears partial dentures    Past Surgical History:  Procedure Laterality Date   ABDOMINAL AORTOGRAM W/LOWER EXTREMITY N/A 03/26/2022   Procedure: ABDOMINAL AORTOGRAM W/LOWER EXTREMITY;  Surgeon: Wellington Hampshire, MD;  Location: [facility];  Service: Cardiovascular;  Laterality: N/A;   APPENDECTOMY     CARDIAC CATHETERIZATION N/A 06/11/2016   Procedure: Left Heart Cath and Coronary Angiography;  Surgeon: Corey Skains, MD;  Location: [place] [facility];  Service: Cardiovascular;  Laterality: N/A;   CARPAL TUNNEL RELEASE Left 04/24/2016   Procedure: CARPAL TUNNEL RELEASE;  Surgeon: Hessie Knows, MD;  Location: [facility];  Service: Orthopedics;  Laterality: Left;   CATARACT EXTRACTION W/ INTRAOCULAR LENS  IMPLANT, BILATERAL Bilateral    CATARACT EXTRACTION, BILATERAL     R eye 07/16/12, L eye 08/13/12 - with lens implant in both eyes   CORONARY ARTERY BYPASS GRAFT N/A 06/27/2016    Procedure: CORONARY ARTERY BYPASS GRAFTING times four using left internal mammary artery and right leg saphenous vein;  Surgeon: Ivin Poot, MD;  Location: [place];  Service: Open Heart Surgery;  Laterality: N/A;   EYE SURGERY Bilateral    JOINT REPLACEMENT     KNEE ARTHROPLASTY Left 02/11/2016   Procedure: COMPUTER ASSISTED TOTAL KNEE ARTHROPLASTY;  Surgeon: Dereck Leep, MD;  Location: [facility];  Service: Orthopedics;  Laterality: Left;   KNEE ARTHROSCOPY Right    LEFT HEART CATH AND CORS/GRAFTS ANGIOGRAPHY Left 01/13/2017   Procedure: Left Heart Cath and Cors/Grafts Angiography;  Surgeon: Corey Skains, MD;  Location: [facility];  Service: Cardiovascular;  Laterality: Left;   METATARSAL HEAD EXCISION Right 03/21/2020   Procedure: METATARSAL HEAD RESECTION RIGHT AND DEBRIDEMENT OF ULCER;  Surgeon: [name], DPM;  Location: [facility];  Service: Podiatry;  Laterality: Right;   MULTIPLE TOOTH EXTRACTIONS     TEE WITHOUT CARDIOVERSION N/A 06/27/2016   Procedure: TRANSESOPHAGEAL ECHOCARDIOGRAM (TEE);  Surgeon: Ivin Poot, MD;  Location: [place];  Service: Open Heart Surgery;  Laterality: N/A;   Allergies  Allergen Reactions   Nitroglycerin Nausea Only and Other (See Comments)    Patches only - headache    Statins Other (See Comments)    BODY PAIN, Pt  taking Crestor*      Fluvastatin Sodium     Other reaction(s): Myalgias   Rosuvastatin     Other reaction(s): Muscle pain, Myalgias   Simvastatin     Other reaction(s): Muscle pain, Myalgias   Ezetimibe Other (See Comments)    Other reaction(s): Muscle Pain   Lipitor [Atorvastatin] Other (See Comments) and Rash    BODY PAIN   Lisinopril Other (See Comments) and Nausea Only    Light headed, dizzy Other reaction(s): Dizziness       Objective/Physical Exam Neurovascular status intact.  Skin incisions appear to be well coapted with sutures intact. No sign of infectious process noted. No dehiscence. No active bleeding  noted.  Chronic bilateral lower extremity edema noted.  Assessment: 1. s/p right great toe amputation. DOS: 05/15/2022   Plan of Care:  1. Patient was evaluated.  2.  Dressings changed.  Patient may now begin washing and showering and getting the foot wet 3.  Recommend gentamicin cream over the incision site with the dressing 4.  Continue Ace wrap's 5.  Continue weightbearing in the postsurgical shoe 6.  Return to clinic 1 week   [name], DPM [facility]  Dr. [name], DPM    [address], [place] [address]                Office [phone]  Fax [phone]

## 2022-06-06 ENCOUNTER — Encounter: Payer: Medicare Other | Admitting: Podiatry

## 2022-06-10 ENCOUNTER — Ambulatory Visit (INDEPENDENT_AMBULATORY_CARE_PROVIDER_SITE_OTHER): Payer: Medicare Other | Admitting: Podiatry

## 2022-06-10 ENCOUNTER — Ambulatory Visit (INDEPENDENT_AMBULATORY_CARE_PROVIDER_SITE_OTHER): Payer: Medicare Other

## 2022-06-10 DIAGNOSIS — Z9889 Other specified postprocedural states: Secondary | ICD-10-CM | POA: Diagnosis not present

## 2022-06-10 NOTE — Progress Notes (Signed)
Chief Complaint  Patient presents with   POV #3 DOS 05/16/2022 RT GREAT TOE AMPUTATION    POV #3 DOS 05/16/2022 RT GREAT TOE AMPUTATION    Subjective:  Patient presents today status post right great toe amputation. DOS: 05/15/2022.  Patient continues to do well.  Patient has completed the oral doxycycline.  His spouse has been changing the dressings daily with gentamicin cream.  No new complaints at this time  Past Medical History:  Diagnosis Date   Allergies    Bronchitis    PMH   CAD (coronary artery disease)    07/2007   Diabetes (HCC)    Elevated lipids    Glaucoma    HBP (high blood pressure)    Headache    Myocardial infarction (HCC)    Neuropathy    Osteoarthritis    PAD (peripheral artery disease) ([place])    a. 03/2022 Lower Ext Angio: No signif Ao-iliac dzs. R PT diff dzs and occluded above ankle. R Pedal arch intact w/ excellent antegrade flow provided by R AT-->no indication for revasc.   Skin cancer    Skin ulcer of right great toe, unspecified ulcer stage ([place])    Sleep apnea    CPAP   Wears glasses    Wears partial dentures    Past Surgical History:  Procedure Laterality Date   ABDOMINAL AORTOGRAM W/LOWER EXTREMITY N/A 03/26/2022   Procedure: ABDOMINAL AORTOGRAM W/LOWER EXTREMITY;  Surgeon: Wellington Hampshire, MD;  Location: [place] [facility];  Service: Cardiovascular;  Laterality: N/A;   APPENDECTOMY     CARDIAC CATHETERIZATION N/A 06/11/2016   Procedure: Left Heart Cath and Coronary Angiography;  Surgeon: Corey Skains, MD;  Location: [place] [facility];  Service: Cardiovascular;  Laterality: N/A;   CARPAL TUNNEL RELEASE Left 04/24/2016   Procedure: CARPAL TUNNEL RELEASE;  Surgeon: Hessie Knows, MD;  Location: [facility];  Service: Orthopedics;  Laterality: Left;   CATARACT EXTRACTION W/ INTRAOCULAR LENS  IMPLANT, BILATERAL Bilateral    CATARACT EXTRACTION, BILATERAL     R eye 07/16/12, L eye 08/13/12 - with lens implant in both eyes   CORONARY ARTERY BYPASS  GRAFT N/A 06/27/2016   Procedure: CORONARY ARTERY BYPASS GRAFTING times four using left internal mammary artery and right leg saphenous vein;  Surgeon: Ivin Poot, MD;  Location: [place];  Service: Open Heart Surgery;  Laterality: N/A;   EYE SURGERY Bilateral    JOINT REPLACEMENT     KNEE ARTHROPLASTY Left 02/11/2016   Procedure: COMPUTER ASSISTED TOTAL KNEE ARTHROPLASTY;  Surgeon: Dereck Leep, MD;  Location: [facility];  Service: Orthopedics;  Laterality: Left;   KNEE ARTHROSCOPY Right    LEFT HEART CATH AND CORS/GRAFTS ANGIOGRAPHY Left 01/13/2017   Procedure: Left Heart Cath and Cors/Grafts Angiography;  Surgeon: Corey Skains, MD;  Location: [place] [facility];  Service: Cardiovascular;  Laterality: Left;   METATARSAL HEAD EXCISION Right 03/21/2020   Procedure: METATARSAL HEAD RESECTION RIGHT AND DEBRIDEMENT OF ULCER;  Surgeon: [name], DPM;  Location: [place];  Service: Podiatry;  Laterality: Right;   MULTIPLE TOOTH EXTRACTIONS     TEE WITHOUT CARDIOVERSION N/A 06/27/2016   Procedure: TRANSESOPHAGEAL ECHOCARDIOGRAM (TEE);  Surgeon: Ivin Poot, MD;  Location: [place];  Service: Open Heart Surgery;  Laterality: N/A;   Allergies  Allergen Reactions   Nitroglycerin Nausea Only and Other (See Comments)    Patches only - headache    Statins Other (See Comments)    BODY  PAIN, Pt taking Crestor*      Fluvastatin Sodium     Other reaction(s): Myalgias   Rosuvastatin     Other reaction(s): Muscle pain, Myalgias   Simvastatin     Other reaction(s): Muscle pain, Myalgias   Ezetimibe Other (See Comments)    Other reaction(s): Muscle Pain   Lipitor [Atorvastatin] Other (See Comments) and Rash    BODY PAIN   Lisinopril Other (See Comments) and Nausea Only    Light headed, dizzy Other reaction(s): Dizziness     Objective/Physical Exam Neurovascular status intact.  Skin incisions appear to be well coapted with sutures intact. No sign of infectious process noted. No dehiscence.  No active bleeding noted.  Chronic bilateral lower extremity edema noted.  Assessment: 1. s/p right great toe amputation. DOS: 05/15/2022   Plan of Care:  1. Patient was evaluated.  2.  Sutures removed 3.  Overall the patient is doing very well.  He may transition out of the postsurgical shoe into good supportive sneakers 4.  Return to clinic 4 weeks for follow-up x-ray  [name], DPM [facility]  Dr. [name], DPM    [address], [place] [address]                Office [phone]  Fax [phone]

## 2022-06-18 ENCOUNTER — Ambulatory Visit (INDEPENDENT_AMBULATORY_CARE_PROVIDER_SITE_OTHER): Payer: Medicare Other | Admitting: Primary Care

## 2022-06-18 VITALS — BP 124/76 | HR 67 | Temp 97.0°F | Ht 70.0 in | Wt 310.0 lb

## 2022-06-18 DIAGNOSIS — N18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, stage 3a: Secondary | ICD-10-CM

## 2022-06-18 DIAGNOSIS — I208 Other forms of angina pectoris: Secondary | ICD-10-CM | POA: Diagnosis not present

## 2022-06-18 DIAGNOSIS — I25119 Atherosclerotic heart disease of native coronary artery with unspecified angina pectoris: Secondary | ICD-10-CM | POA: Diagnosis not present

## 2022-06-18 DIAGNOSIS — R519 Headache, unspecified: Secondary | ICD-10-CM

## 2022-06-18 DIAGNOSIS — E119 Type 2 diabetes mellitus without complications: Secondary | ICD-10-CM

## 2022-06-18 DIAGNOSIS — G4733 Obstructive sleep apnea (adult) (pediatric): Secondary | ICD-10-CM | POA: Diagnosis not present

## 2022-06-18 DIAGNOSIS — E785 Hyperlipidemia, unspecified: Secondary | ICD-10-CM | POA: Diagnosis not present

## 2022-06-18 DIAGNOSIS — K219 Gastro-esophageal reflux disease without esophagitis: Secondary | ICD-10-CM | POA: Diagnosis not present

## 2022-06-18 DIAGNOSIS — I10 Essential (primary) hypertension: Secondary | ICD-10-CM

## 2022-06-18 DIAGNOSIS — E1065 Type 1 diabetes mellitus with hyperglycemia: Secondary | ICD-10-CM

## 2022-06-18 LAB — POCT GLYCOSYLATED HEMOGLOBIN (HGB A1C): Hemoglobin A1C: 7.9 % — AB (ref 4.0–5.6)

## 2022-06-18 NOTE — Progress Notes (Signed)
Subjective:    Patient ID: Lawrence [name], male    DOB: 08/25/1951, 71 y.o.   MRN: [id number]  Diabetes Hypoglycemia symptoms include headaches. Pertinent negatives for hypoglycemia include no dizziness or nervousness/anxiousness. Pertinent negatives for diabetes include no chest pain.    Lawrence [name] is a very pleasant 71 y.o. male with a history of CAD with myocardial infarction, hypertension, bilateral carotid artery stenosis, PAD, osteoarthritis, type 1 diabetes, CKD, hyperlipidemia, migraines, OSA, lower extremity edema who presents today for follow-up of chronic conditions.  He largely follows with the [facility] for his medical care.  1) CAD/Hyperlipidemia/Hypertension/PAD: Currently managed on rosuvastatin 20 mg daily, Ranexa 1000 mg twice daily, nitroglycerin as needed, metoprolol tartrate 25 mg twice daily, isosorbide mononitrate 60 mg daily, furosemide 50 mg daily, clopidogrel 75 mg daily, aspirin 81 mg daily, potassium chloride.  Following with cardiology, Dr. Nehemiah Massed and Dr. Fletcher Anon, last office visit was in May 2023 with Dr. Fletcher Anon for evaluation of nonhealing ulceration to right big toe. CV studies revealed PAD with popliteal artery occlusion. Given these results he underwent abdominal aortogram with lower extremity which was negative for significant aortic disease, SFA, popliteal disease.  His posterior tibial artery was noted to be occluded above the ankle.  Following with podiatry for management of ulcer, underwent amputation of right great toe in July 2023, follow up visit with podiatry in August 2023.   He does experience intermittent chest pain, no recent symptoms. He has an upcoming appointment with Dr. Nehemiah Massed.   BP Readings from Last 3 Encounters:  06/18/22 124/76  04/09/22 130/60  03/26/22 (!) 136/49     2) Migraines: Following with neurology through [facility] and is managed on Ajovy 675 mg every 90 days. No improvement overall as of yet. Amovig was ineffective.  He has failed Topamax and Gabapentin.   3) Type 1 Diabetes: Following with endocrinology through the [place].  He is managed on Semglee 20 units at bedtime, NovoLog insulin 10 to 20 units 3 times daily with meals. He checks his glucose levels regularly which run between low 100's to low 200's. History of hypoglycemic episodes which have been less frequent over the last year.   Review of Systems  Respiratory:  Negative for shortness of breath.   Cardiovascular:  Positive for leg swelling. Negative for chest pain.  Gastrointestinal:  Negative for diarrhea.  Neurological:  Positive for headaches. Negative for dizziness.  Psychiatric/Behavioral:  The patient is not nervous/anxious.          Past Medical History:  Diagnosis Date   Allergies    Bronchitis    PMH   CAD (coronary artery disease)    07/2007   Diabetes (HCC)    Elevated lipids    Glaucoma    HBP (high blood pressure)    Headache    Myocardial infarction (HCC)    Neuropathy    Osteoarthritis    PAD (peripheral artery disease) ([place])    a. 03/2022 Lower Ext Angio: No signif Ao-iliac dzs. R PT diff dzs and occluded above ankle. R Pedal arch intact w/ excellent antegrade flow provided by R AT-->no indication for revasc.   Skin cancer    Skin ulcer of right great toe, unspecified ulcer stage (HCC)    Sleep apnea    CPAP   Wears glasses    Wears partial dentures     Social History   Socioeconomic History   Marital status: Married    Spouse name: Not on file  Number of children: Not on file   Years of education: Not on file   Highest education level: Not on file  Occupational History   Not on file  Tobacco Use   Smoking status: Former    Packs/day: 1.00    Years: 16.00    Total pack years: 16.00    Types: Cigarettes    Start date: 11/03/1969    Quit date: 07/22/1986    Years since quitting: 35.9   Smokeless tobacco: Never  Vaping Use   Vaping Use: Never used  Substance and Sexual Activity   Alcohol use: Yes     Comment: occasionally   Drug use: No   Sexual activity: Never  Other Topics Concern   Not on file  Social History Narrative   Married.  Lives at home with wife.    Social Determinants of Health   Financial Resource Strain: Low Risk  (10/10/2021)   Overall Financial Resource Strain (CARDIA)    Difficulty of Paying Living Expenses: Not hard at all  Food Insecurity: No Food Insecurity (10/10/2021)   Hunger Vital Sign    Worried About Running Out of Food in the Last Year: Never true    Ran Out of Food in the Last Year: Never true  Transportation Needs: No Transportation Needs (10/10/2021)   PRAPARE - Hydrologist (Medical): No    Lack of Transportation (Non-Medical): No  Physical Activity: Inactive (10/10/2021)   Exercise Vital Sign    Days of Exercise per Week: 0 days    Minutes of Exercise per Session: 0 min  Stress: No Stress Concern Present (10/10/2021)   [place]    Feeling of Stress : Not at all  Social Connections: Moderately Integrated (10/10/2021)   Social Connection and Isolation Panel [NHANES]    Frequency of Communication with Friends and Family: More than three times a week    Frequency of Social Gatherings with Friends and Family: More than three times a week    Attends Religious Services: 1 to 4 times per year    Active Member of [employer] or Organizations: No    Attends Archivist Meetings: Never    Marital Status: Married  Human resources officer Violence: Not At Risk (10/10/2021)   Humiliation, Afraid, Rape, and Kick questionnaire    Fear of Current or Ex-Partner: No    Emotionally Abused: No    Physically Abused: No    Sexually Abused: No    Past Surgical History:  Procedure Laterality Date   ABDOMINAL AORTOGRAM W/LOWER EXTREMITY N/A 03/26/2022   Procedure: ABDOMINAL AORTOGRAM W/LOWER EXTREMITY;  Surgeon: Wellington Hampshire, MD;  Location: [place] [facility];  Service:  Cardiovascular;  Laterality: N/A;   APPENDECTOMY     CARDIAC CATHETERIZATION N/A 06/11/2016   Procedure: Left Heart Cath and Coronary Angiography;  Surgeon: Corey Skains, MD;  Location: [place] [facility];  Service: Cardiovascular;  Laterality: N/A;   CARPAL TUNNEL RELEASE Left 04/24/2016   Procedure: CARPAL TUNNEL RELEASE;  Surgeon: Hessie Knows, MD;  Location: [facility];  Service: Orthopedics;  Laterality: Left;   CATARACT EXTRACTION W/ INTRAOCULAR LENS  IMPLANT, BILATERAL Bilateral    CATARACT EXTRACTION, BILATERAL     R eye 07/16/12, L eye 08/13/12 - with lens implant in both eyes   CORONARY ARTERY BYPASS GRAFT N/A 06/27/2016   Procedure: CORONARY ARTERY BYPASS GRAFTING times four using left internal mammary artery and right leg saphenous vein;  Surgeon: Ivin Poot, MD;  Location: [place];  Service: Open Heart Surgery;  Laterality: N/A;   EYE SURGERY Bilateral    JOINT REPLACEMENT     KNEE ARTHROPLASTY Left 02/11/2016   Procedure: COMPUTER ASSISTED TOTAL KNEE ARTHROPLASTY;  Surgeon: Dereck Leep, MD;  Location: [facility];  Service: Orthopedics;  Laterality: Left;   KNEE ARTHROSCOPY Right    LEFT HEART CATH AND CORS/GRAFTS ANGIOGRAPHY Left 01/13/2017   Procedure: Left Heart Cath and Cors/Grafts Angiography;  Surgeon: Corey Skains, MD;  Location: [place] [facility];  Service: Cardiovascular;  Laterality: Left;   METATARSAL HEAD EXCISION Right 03/21/2020   Procedure: METATARSAL HEAD RESECTION RIGHT AND DEBRIDEMENT OF ULCER;  Surgeon: [name], DPM;  Location: [place];  Service: Podiatry;  Laterality: Right;   MULTIPLE TOOTH EXTRACTIONS     TEE WITHOUT CARDIOVERSION N/A 06/27/2016   Procedure: TRANSESOPHAGEAL ECHOCARDIOGRAM (TEE);  Surgeon: Ivin Poot, MD;  Location: [place];  Service: Open Heart Surgery;  Laterality: N/A;    Family History  Problem Relation Age of Onset   Hypertension Mother    Lupus Mother    Heart Problems Mother    Hypertension Father    Diabetes  Father    Heart attack Father 63       died in his 65s   Drug abuse Brother    Heart disease Brother    Heart attack Maternal Grandmother    Arthritis Maternal Grandmother    Arthritis Maternal Grandfather    Heart attack Paternal Grandmother    Arthritis Paternal Grandmother    Arthritis Paternal Grandfather     Allergies  Allergen Reactions   Nitroglycerin Nausea Only and Other (See Comments)    Patches only - headache    Statins Other (See Comments)    BODY PAIN, Pt taking Crestor*      Fluvastatin Other (See Comments)    Other reaction(s): Muscle pain   Fluvastatin Sodium     Other reaction(s): Myalgias   Rosuvastatin Other (See Comments)    Other reaction(s): Muscle pain, Myalgias   Simvastatin Other (See Comments)    Other reaction(s): Muscle pain, Myalgias   Ezetimibe Other (See Comments)    Other reaction(s): Muscle Pain   Lipitor [Atorvastatin] Other (See Comments) and Rash    BODY PAIN   Lisinopril Other (See Comments) and Nausea Only    Light headed, dizzy Other reaction(s): Dizziness    Current Outpatient Medications on File Prior to Visit  Medication Sig Dispense Refill   AJOVY 225 MG/1.5ML SOAJ Inject into the skin.     aspirin 81 MG EC tablet Take 81 mg by mouth daily. Swallow whole.     Calcium-Magnesium-Vitamin D (CALCIUM 1200+D3 PO) Take 1 tablet by mouth daily. D3 25 mcg     cholecalciferol (VITAMIN D) 1000 units tablet Take 2,000 Units by mouth in the morning and at bedtime.      clopidogrel (PLAVIX) 75 MG tablet Take 1 tablet (75 mg total) by mouth daily. 30 tablet 0   collagenase (SANTYL) 250 UNIT/GM ointment Apply 1 application  topically daily.     cyanocobalamin (VITAMIN B12) 500 MCG tablet Take 500 mcg by mouth daily.     famotidine (PEPCID) 20 MG tablet Take 1 tablet (20 mg total) by mouth daily. 30 tablet 0   fluticasone (FLONASE) 50 MCG/ACT nasal spray Place 1 spray into both nostrils daily as needed for allergies.     furosemide (LASIX)  40 MG tablet Take  1 tablet (40 mg total) by mouth daily. 30 tablet 1   glucagon 1 MG injection Inject 1 mg into the vein once as needed. Reported on 02/11/2016     GLUTOSE 15 40 % GEL 1 Tube once as needed for low blood sugar.     insulin aspart (NOVOLOG) 100 UNIT/ML injection Inject 20 Units into the skin 3 (three) times daily. (Patient taking differently: Inject 10-20 Units into the skin 3 (three) times daily with meals. Sliding Scale) 10 mL 5   insulin glargine-yfgn (SEMGLEE) 100 UNIT/ML injection 20 Units at bedtime.     isosorbide mononitrate (IMDUR) 60 MG 24 hr tablet Take 60 mg by mouth every evening.     loratadine (CLARITIN) 10 MG tablet Take 10 mg by mouth daily.     magnesium oxide (MAG-OX) 400 MG tablet Take 400 mg by mouth daily.     metoprolol tartrate (LOPRESSOR) 25 MG tablet Take 25 mg by mouth 2 (two) times daily.     nitroGLYCERIN (NITROSTAT) 0.4 MG SL tablet DISSOLVE 1 TABLET UNDER THE TONGUE EVERY 5 MINUTES AS NEEDED FOR CHEST PAIN 30 tablet 0   potassium chloride SA (K-DUR,KLOR-CON) 20 MEQ tablet Take 1 tablet (20 mEq total) by mouth daily. 30 tablet 1   raNITIdine HCl (ACID REDUCER PO) Take 1 tablet by mouth daily.     ranolazine (RANEXA) 1000 MG SR tablet Take 1,000 mg by mouth 2 (two) times daily.     rosuvastatin (CRESTOR) 20 MG tablet Take 1 tablet (20 mg total) by mouth daily at 6 PM. 30 tablet 1   Travoprost, BAK Free, (TRAVATAN) 0.004 % SOLN ophthalmic solution Place 1 drop into both eyes at bedtime.      triamcinolone ointment (KENALOG) 0.1 % Apply 1 application. topically daily as needed (rash).     vitamin C (ASCORBIC ACID) 500 MG tablet Take 500 mg by mouth 2 (two) times daily.      No current facility-administered medications on file prior to visit.    BP 124/76   Pulse 67   Temp (!) 97 F (36.1 C) (Oral)   Ht '5\' 10"'$  (1.778 m)   Wt (!) 310 lb (140.6 kg)   SpO2 96%   BMI 44.48 kg/m  Objective:   Physical Exam Cardiovascular:     Rate and Rhythm: Normal  rate and regular rhythm.  Pulmonary:     Effort: Pulmonary effort is normal.     Breath sounds: Normal breath sounds. No wheezing or rales.  Musculoskeletal:     Cervical back: Neck supple.  Skin:    General: Skin is warm and dry.  Neurological:     Mental Status: He is alert and oriented to person, place, and time.  Psychiatric:        Mood and Affect: Mood normal.           Assessment & Plan:   Problem List Items Addressed This Visit       Cardiovascular and Mediastinum   CAD (coronary artery disease)    Following with cardiology through [facility]  Reviewed cardiology notes from March 2023 through [place] 1000 mg BID, metoprolol tartrate 25 mg BID, Imdur 60 mg daily, Plavix 75 mg daily.       Benign essential HTN    Controlled.  Continue metoprolol tartrate 25 mg BID. Following with cardiology       Stable angina [facility])    Following with cardiology, intermittent symptoms.  Continue Ranexa 1000  mg BID, Imdur 60 mg daily.        Respiratory   OSA (obstructive sleep apnea)    Compliant to CPAP machine nightly.        Digestive   GERD (gastroesophageal reflux disease)    Controlled.  Continue famotidine 20 mg daily.        Endocrine   Type 1 diabetes mellitus with hyperglycemia [facility])    Following with endocrinology through [place]. A1C of 7.9 today.  Continue Semglee 20 units daily, Novolog 10-20 units TID with meals.         Genitourinary   Stage 3a chronic kidney disease ([place])    Improved. Reviewed labs from May 2023.        Other   Hyperlipidemia    Controlled.  Continue rosuvastatin 20 mg daily.      Frequent headaches    Following with neurology through [facility]. Continue Ajovy 675 mg every 90 days. Office notes reviewed from May 2023      Relevant Medications   AJOVY 225 MG/1.5ML SOAJ   Other Visit Diagnoses     Diabetes mellitus without complication ([place])    -  Primary   Relevant  Orders   POCT glycosylated hemoglobin (Hb A1C) (Completed)          Pleas Koch, NP

## 2022-06-18 NOTE — Assessment & Plan Note (Signed)
Controlled.  Continue metoprolol tartrate 25 mg BID. Following with cardiology

## 2022-06-18 NOTE — Assessment & Plan Note (Signed)
Following with cardiology through [facility].  Reviewed cardiology notes from March 2023 through [place] 1000 mg BID, metoprolol tartrate 25 mg BID, Imdur 60 mg daily, Plavix 75 mg daily.

## 2022-06-18 NOTE — Patient Instructions (Signed)
It was a pleasure to see you today!   

## 2022-06-18 NOTE — Assessment & Plan Note (Signed)
Following with neurology through [facility]. Continue Ajovy 675 mg every 90 days. Office notes reviewed from May 2023

## 2022-06-18 NOTE — Assessment & Plan Note (Signed)
Controlled.  Continue famotidine 20 mg daily.  

## 2022-06-18 NOTE — Assessment & Plan Note (Signed)
Following with cardiology, intermittent symptoms.  Continue Ranexa 1000 mg BID, Imdur 60 mg daily.

## 2022-06-18 NOTE — Assessment & Plan Note (Signed)
Controlled.  Continue rosuvastatin 20 mg daily.  

## 2022-06-18 NOTE — Assessment & Plan Note (Signed)
Compliant to CPAP machine nightly.

## 2022-06-18 NOTE — Assessment & Plan Note (Signed)
Following with endocrinology through [place]. A1C of 7.9 today.  Continue Semglee 20 units daily, Novolog 10-20 units TID with meals.

## 2022-06-18 NOTE — Assessment & Plan Note (Signed)
Improved. Reviewed labs from May 2023.

## 2022-07-01 DIAGNOSIS — I25708 Atherosclerosis of coronary artery bypass graft(s), unspecified, with other forms of angina pectoris: Secondary | ICD-10-CM | POA: Diagnosis not present

## 2022-07-01 DIAGNOSIS — E782 Mixed hyperlipidemia: Secondary | ICD-10-CM | POA: Diagnosis not present

## 2022-07-11 ENCOUNTER — Ambulatory Visit (INDEPENDENT_AMBULATORY_CARE_PROVIDER_SITE_OTHER): Payer: Medicare Other | Admitting: Podiatry

## 2022-07-11 ENCOUNTER — Ambulatory Visit (INDEPENDENT_AMBULATORY_CARE_PROVIDER_SITE_OTHER): Payer: Medicare Other

## 2022-07-11 DIAGNOSIS — M79675 Pain in left toe(s): Secondary | ICD-10-CM

## 2022-07-11 DIAGNOSIS — M79674 Pain in right toe(s): Secondary | ICD-10-CM

## 2022-07-11 DIAGNOSIS — B351 Tinea unguium: Secondary | ICD-10-CM

## 2022-07-11 NOTE — Progress Notes (Signed)
Chief Complaint  Patient presents with   Follow-up    Patient is here for follow-up RT great toe amputation.    Subjective:  Patient presents today for routine foot care.  He does have a history of right great toe amputation on 05/15/2022 which has healed.  He is doing well.  He is requesting a nail trim today his nails are very painful and tender especially when they get elongated and become symptomatic in close toed shoes.  He is unable to trim his own toenails  Past Medical History:  Diagnosis Date   Allergies    Bronchitis    PMH   CAD (coronary artery disease)    07/2007   Diabetes (HCC)    Elevated lipids    Glaucoma    HBP (high blood pressure)    Headache    Myocardial infarction (HCC)    Neuropathy    Osteoarthritis    PAD (peripheral artery disease) ([place])    a. 03/2022 Lower Ext Angio: No signif Ao-iliac dzs. R PT diff dzs and occluded above ankle. R Pedal arch intact w/ excellent antegrade flow provided by R AT-->no indication for revasc.   Skin cancer    Skin ulcer of right great toe, unspecified ulcer stage ([place])    Sleep apnea    CPAP   Wears glasses    Wears partial dentures    Past Surgical History:  Procedure Laterality Date   ABDOMINAL AORTOGRAM W/LOWER EXTREMITY N/A 03/26/2022   Procedure: ABDOMINAL AORTOGRAM W/LOWER EXTREMITY;  Surgeon: Wellington Hampshire, MD;  Location: [place] [facility];  Service: Cardiovascular;  Laterality: N/A;   APPENDECTOMY     CARDIAC CATHETERIZATION N/A 06/11/2016   Procedure: Left Heart Cath and Coronary Angiography;  Surgeon: Corey Skains, MD;  Location: [place] [facility];  Service: Cardiovascular;  Laterality: N/A;   CARPAL TUNNEL RELEASE Left 04/24/2016   Procedure: CARPAL TUNNEL RELEASE;  Surgeon: Hessie Knows, MD;  Location: [facility];  Service: Orthopedics;  Laterality: Left;   CATARACT EXTRACTION W/ INTRAOCULAR LENS  IMPLANT, BILATERAL Bilateral    CATARACT EXTRACTION, BILATERAL     R eye 07/16/12, L eye 08/13/12 -  with lens implant in both eyes   CORONARY ARTERY BYPASS GRAFT N/A 06/27/2016   Procedure: CORONARY ARTERY BYPASS GRAFTING times four using left internal mammary artery and right leg saphenous vein;  Surgeon: Ivin Poot, MD;  Location: [place];  Service: Open Heart Surgery;  Laterality: N/A;   EYE SURGERY Bilateral    JOINT REPLACEMENT     KNEE ARTHROPLASTY Left 02/11/2016   Procedure: COMPUTER ASSISTED TOTAL KNEE ARTHROPLASTY;  Surgeon: Dereck Leep, MD;  Location: [facility];  Service: Orthopedics;  Laterality: Left;   KNEE ARTHROSCOPY Right    LEFT HEART CATH AND CORS/GRAFTS ANGIOGRAPHY Left 01/13/2017   Procedure: Left Heart Cath and Cors/Grafts Angiography;  Surgeon: Corey Skains, MD;  Location: [place] [facility];  Service: Cardiovascular;  Laterality: Left;   METATARSAL HEAD EXCISION Right 03/21/2020   Procedure: METATARSAL HEAD RESECTION RIGHT AND DEBRIDEMENT OF ULCER;  Surgeon: [name], DPM;  Location: [place];  Service: Podiatry;  Laterality: Right;   MULTIPLE TOOTH EXTRACTIONS     TEE WITHOUT CARDIOVERSION N/A 06/27/2016   Procedure: TRANSESOPHAGEAL ECHOCARDIOGRAM (TEE);  Surgeon: Ivin Poot, MD;  Location: [place];  Service: Open Heart Surgery;  Laterality: N/A;   Allergies  Allergen Reactions   Nitroglycerin Nausea Only and Other (See Comments)  Patches only - headache    Statins Other (See Comments)    BODY PAIN, Pt taking Crestor*      Fluvastatin Other (See Comments)    Other reaction(s): Muscle pain   Fluvastatin Sodium     Other reaction(s): Myalgias   Rosuvastatin Other (See Comments)    Other reaction(s): Muscle pain, Myalgias   Simvastatin Other (See Comments)    Other reaction(s): Muscle pain, Myalgias   Ezetimibe Other (See Comments)    Other reaction(s): Muscle Pain   Lipitor [Atorvastatin] Other (See Comments) and Rash    BODY PAIN   Lisinopril Other (See Comments) and Nausea Only    Light headed, dizzy Other reaction(s): Dizziness      Objective/Physical Exam Neurovascular status intact.  Skin incision and amputation sites healed.  There is some hyperkeratotic elongated dystrophic nails noted 1-5 bilateral.  Assessment: 1. s/p right great toe amputation. DOS: 05/15/2022 2.  Pain due to onychomycosis of toenails both   Plan of Care:  1. Patient was evaluated.  2.  Amputation sites healed 3.  Mechanical debridement of nails 1-5 bilateral was performed using a nail nipper without incident or bleeding 4.  Return to clinic 3 months for routine foot care  [name], DPM [facility]  Dr. [name], DPM    [address], [place] [address]                Office [phone]  Fax [phone]

## 2022-07-15 DIAGNOSIS — Z85828 Personal history of other malignant neoplasm of skin: Secondary | ICD-10-CM | POA: Diagnosis not present

## 2022-07-15 DIAGNOSIS — D2261 Melanocytic nevi of right upper limb, including shoulder: Secondary | ICD-10-CM | POA: Diagnosis not present

## 2022-07-15 DIAGNOSIS — D2272 Melanocytic nevi of left lower limb, including hip: Secondary | ICD-10-CM | POA: Diagnosis not present

## 2022-07-15 DIAGNOSIS — D2262 Melanocytic nevi of left upper limb, including shoulder: Secondary | ICD-10-CM | POA: Diagnosis not present

## 2022-07-15 DIAGNOSIS — D225 Melanocytic nevi of trunk: Secondary | ICD-10-CM | POA: Diagnosis not present

## 2022-07-15 DIAGNOSIS — L821 Other seborrheic keratosis: Secondary | ICD-10-CM | POA: Diagnosis not present

## 2022-08-14 DIAGNOSIS — R440 Auditory hallucinations: Secondary | ICD-10-CM | POA: Diagnosis not present

## 2022-08-14 DIAGNOSIS — G43809 Other migraine, not intractable, without status migrainosus: Secondary | ICD-10-CM | POA: Diagnosis not present

## 2022-08-14 DIAGNOSIS — G939 Disorder of brain, unspecified: Secondary | ICD-10-CM | POA: Diagnosis not present

## 2022-08-14 DIAGNOSIS — R42 Dizziness and giddiness: Secondary | ICD-10-CM | POA: Diagnosis not present

## 2022-08-14 DIAGNOSIS — R251 Tremor, unspecified: Secondary | ICD-10-CM | POA: Diagnosis not present

## 2022-08-14 DIAGNOSIS — G4733 Obstructive sleep apnea (adult) (pediatric): Secondary | ICD-10-CM | POA: Diagnosis not present

## 2022-08-22 ENCOUNTER — Ambulatory Visit (INDEPENDENT_AMBULATORY_CARE_PROVIDER_SITE_OTHER): Payer: Medicare Other | Admitting: Podiatry

## 2022-08-22 DIAGNOSIS — L97522 Non-pressure chronic ulcer of other part of left foot with fat layer exposed: Secondary | ICD-10-CM

## 2022-08-22 DIAGNOSIS — E0843 Diabetes mellitus due to underlying condition with diabetic autonomic (poly)neuropathy: Secondary | ICD-10-CM | POA: Diagnosis not present

## 2022-08-22 MED ORDER — SULFAMETHOXAZOLE-TRIMETHOPRIM 800-160 MG PO TABS: 1.0000 | ORAL_TABLET | Freq: Two times a day (BID) | ORAL | 0 refills | Status: DC

## 2022-08-22 NOTE — Progress Notes (Signed)
Chief Complaint  Patient presents with   Follow-up    Patient is here for follow-up right foot ulcer.    Subjective:  71 y.o. male with PMHx of diabetes mellitus presenting today for new complaint of an ulcer that developed to the right second toe.  He does have a history already of the right great toe amputation.  They noticed the callus became a wound over the past few weeks.  They present for further treatment and evaluation.   Past Medical History:  Diagnosis Date   Allergies    Bronchitis    PMH   CAD (coronary artery disease)    07/2007   Diabetes (HCC)    Elevated lipids    Glaucoma    HBP (high blood pressure)    Headache    Myocardial infarction (HCC)    Neuropathy    Osteoarthritis    PAD (peripheral artery disease) ([place])    a. 03/2022 Lower Ext Angio: No signif Ao-iliac dzs. R PT diff dzs and occluded above ankle. R Pedal arch intact w/ excellent antegrade flow provided by R AT-->no indication for revasc.   Skin cancer    Skin ulcer of right great toe, unspecified ulcer stage ([place])    Sleep apnea    CPAP   Wears glasses    Wears partial dentures     Past Surgical History:  Procedure Laterality Date   ABDOMINAL AORTOGRAM W/LOWER EXTREMITY N/A 03/26/2022   Procedure: ABDOMINAL AORTOGRAM W/LOWER EXTREMITY;  Surgeon: Wellington Hampshire, MD;  Location: [facility];  Service: Cardiovascular;  Laterality: N/A;   APPENDECTOMY     CARDIAC CATHETERIZATION N/A 06/11/2016   Procedure: Left Heart Cath and Coronary Angiography;  Surgeon: Corey Skains, MD;  Location: [facility];  Service: Cardiovascular;  Laterality: N/A;   CARPAL TUNNEL RELEASE Left 04/24/2016   Procedure: CARPAL TUNNEL RELEASE;  Surgeon: Hessie Knows, MD;  Location: [facility];  Service: Orthopedics;  Laterality: Left;   CATARACT EXTRACTION W/ INTRAOCULAR LENS  IMPLANT, BILATERAL Bilateral    CATARACT EXTRACTION, BILATERAL     R eye 07/16/12, L eye 08/13/12 - with lens implant in both eyes    CORONARY ARTERY BYPASS GRAFT N/A 06/27/2016   Procedure: CORONARY ARTERY BYPASS GRAFTING times four using left internal mammary artery and right leg saphenous vein;  Surgeon: Ivin Poot, MD;  Location: [place];  Service: Open Heart Surgery;  Laterality: N/A;   EYE SURGERY Bilateral    JOINT REPLACEMENT     KNEE ARTHROPLASTY Left 02/11/2016   Procedure: COMPUTER ASSISTED TOTAL KNEE ARTHROPLASTY;  Surgeon: Dereck Leep, MD;  Location: [facility];  Service: Orthopedics;  Laterality: Left;   KNEE ARTHROSCOPY Right    LEFT HEART CATH AND CORS/GRAFTS ANGIOGRAPHY Left 01/13/2017   Procedure: Left Heart Cath and Cors/Grafts Angiography;  Surgeon: Corey Skains, MD;  Location: [facility];  Service: Cardiovascular;  Laterality: Left;   METATARSAL HEAD EXCISION Right 03/21/2020   Procedure: METATARSAL HEAD RESECTION RIGHT AND DEBRIDEMENT OF ULCER;  Surgeon: [name], DPM;  Location: [facility];  Service: Podiatry;  Laterality: Right;   MULTIPLE TOOTH EXTRACTIONS     TEE WITHOUT CARDIOVERSION N/A 06/27/2016   Procedure: TRANSESOPHAGEAL ECHOCARDIOGRAM (TEE);  Surgeon: Ivin Poot, MD;  Location: [facility];  Service: Open Heart Surgery;  Laterality: N/A;    Allergies  Allergen Reactions   Nitroglycerin Nausea Only and Other (See Comments)    Patches only - headache  Statins Other (See Comments)    BODY PAIN, Pt taking Crestor*      Fluvastatin Other (See Comments)    Other reaction(s): Muscle pain   Fluvastatin Sodium     Other reaction(s): Myalgias   Rosuvastatin Other (See Comments)    Other reaction(s): Muscle pain, Myalgias   Simvastatin Other (See Comments)    Other reaction(s): Muscle pain, Myalgias   Ezetimibe Other (See Comments)    Other reaction(s): Muscle Pain   Lipitor [Atorvastatin] Other (See Comments) and Rash    BODY PAIN   Lisinopril Other (See Comments) and Nausea Only    Light headed, dizzy Other reaction(s): Dizziness      Objective/Physical  Exam General: The patient is alert and oriented x3 in no acute distress.  Dermatology:  Wound #1 noted to the distal tip of the right second toe measuring approximately 0.6 x 0.6 x 0.1 cm (LxWxD).   To the noted ulceration(s), there is no eschar. There is a moderate amount of slough, fibrin, and necrotic tissue noted. Granulation tissue and wound base is red. There is a minimal amount of serosanguineous drainage noted. There is no exposed bone muscle-tendon ligament or joint. There is no malodor. Periwound integrity is intact. Skin is warm, dry and supple bilateral lower extremities.  Vascular: Palpable pedal pulses bilaterally. No edema or erythema noted. Capillary refill within normal limits.  Neurological: Epicritic and protective threshold diminished bilaterally.   Musculoskeletal Exam: History of right great toe amputation.  With ambulation there is excessive plantarflexion of the right second toe and the distal tip of the right second toe is pressing against the ground and the sole of the shoe  Assessment: 1.  Ulcer right second toe secondary to diabetes mellitus 2. diabetes mellitus w/ peripheral neuropathy 3.  History of right great toe amputation  Plan of Care:  1. Patient was evaluated. 2. medically necessary excisional debridement including subcutaneous tissue was performed using a tissue nipper and a chisel blade. Excisional debridement of all the necrotic nonviable tissue down to healthy bleeding viable tissue was performed with post-debridement measurements same as pre-. 3. the wound was cleansed and dry sterile dressing applied. 4.  Patient has gentamicin cream and Betadine at home.  Continue daily  5.  Continue open toe shoes.  Patient has a postsurgical shoe at home  6.  The patient believes he is getting the wound to the distal tip of the toe because of plantarflexion with weightbearing.  He says that he is constantly scraping the tip of his toe onto the ground.  Once the  acute cellulitis/infection of the toe has improved potentially next visit we will likely proceed with a flexor tenotomy of the second toe to alleviate pressure from the distal tip and resolve the excessive plantarflexion of the toe which is contributing to the wound  7.  Patient is to return to clinic in 2 weeks.   [name], DPM [facility]  Dr. [name], DPM    [address], [place] [address]                Office [phone]  Fax [phone]

## 2022-08-25 DIAGNOSIS — R42 Dizziness and giddiness: Secondary | ICD-10-CM | POA: Diagnosis not present

## 2022-08-26 DIAGNOSIS — H353221 Exudative age-related macular degeneration, left eye, with active choroidal neovascularization: Secondary | ICD-10-CM | POA: Diagnosis not present

## 2022-08-26 DIAGNOSIS — E113593 Type 2 diabetes mellitus with proliferative diabetic retinopathy without macular edema, bilateral: Secondary | ICD-10-CM | POA: Diagnosis not present

## 2022-08-26 DIAGNOSIS — Z961 Presence of intraocular lens: Secondary | ICD-10-CM | POA: Diagnosis not present

## 2022-08-29 ENCOUNTER — Encounter: Payer: Self-pay | Admitting: Primary Care

## 2022-08-29 ENCOUNTER — Ambulatory Visit (INDEPENDENT_AMBULATORY_CARE_PROVIDER_SITE_OTHER): Payer: Medicare Other | Admitting: Podiatry

## 2022-08-29 ENCOUNTER — Ambulatory Visit (INDEPENDENT_AMBULATORY_CARE_PROVIDER_SITE_OTHER)
Admission: RE | Admit: 2022-08-29 | Discharge: 2022-08-29 | Disposition: A | Discharge status: Home / Self Care | Payer: Medicare Other | Source: Ambulatory Visit | Attending: Primary Care | Admitting: Primary Care

## 2022-08-29 ENCOUNTER — Ambulatory Visit (INDEPENDENT_AMBULATORY_CARE_PROVIDER_SITE_OTHER): Payer: Medicare Other | Admitting: Primary Care

## 2022-08-29 ENCOUNTER — Encounter: Payer: Self-pay | Admitting: Podiatry

## 2022-08-29 VITALS — BP 138/76 | HR 60 | Temp 98.1°F | Ht 70.0 in | Wt 312.0 lb

## 2022-08-29 DIAGNOSIS — E1065 Type 1 diabetes mellitus with hyperglycemia: Secondary | ICD-10-CM | POA: Diagnosis not present

## 2022-08-29 DIAGNOSIS — I2089 Other forms of angina pectoris: Secondary | ICD-10-CM | POA: Diagnosis not present

## 2022-08-29 DIAGNOSIS — R0789 Other chest pain: Secondary | ICD-10-CM

## 2022-08-29 DIAGNOSIS — N644 Mastodynia: Secondary | ICD-10-CM

## 2022-08-29 DIAGNOSIS — R0781 Pleurodynia: Secondary | ICD-10-CM | POA: Diagnosis not present

## 2022-08-29 DIAGNOSIS — I517 Cardiomegaly: Secondary | ICD-10-CM | POA: Diagnosis not present

## 2022-08-29 DIAGNOSIS — M2041 Other hammer toe(s) (acquired), right foot: Secondary | ICD-10-CM

## 2022-08-29 DIAGNOSIS — Z951 Presence of aortocoronary bypass graft: Secondary | ICD-10-CM | POA: Diagnosis not present

## 2022-08-29 HISTORY — DX: Mastodynia: N64.4

## 2022-08-29 LAB — MICROALBUMIN / CREATININE URINE RATIO
Creatinine,U: 24.8 mg/dL
Microalb Creat Ratio: 2.8 mg/g (ref 0.0–30.0)
Microalb, Ur: <0.7 mg/dL (ref 0.0–1.9)

## 2022-08-29 NOTE — Progress Notes (Signed)
Chief Complaint  Patient presents with   Wound Check    Wife stated, "It looks worse to me.  It's red."   Foot Pain    "I'm problems with my heel." N -  heel pain  L - plantar rt D - off and on 1 month O - suddenly C - stabbing, shooting pain A - laying in bed T - Acetaminophen     HPI: 71 y.o. male PMHx diabetes mellitus with peripheral neuropathy presenting today for f/u evaluation of an ulcer that developed to the distal tip of the right second toe.  Patient is currently taking oral antibiotics prescribed last visit. Presenting for further evaluation and treatment  Past Medical History:  Diagnosis Date   Allergies    Bronchitis    PMH   CAD (coronary artery disease)    07/2007   Diabetes ([place])    Elevated lipids    Glaucoma    HBP (high blood pressure)    Headache    Myocardial infarction (HCC)    Neuropathy    Osteoarthritis    PAD (peripheral artery disease) ([place])    a. 03/2022 Lower Ext Angio: No signif Ao-iliac dzs. R PT diff dzs and occluded above ankle. R Pedal arch intact w/ excellent antegrade flow provided by R AT-->no indication for revasc.   Skin cancer    Skin ulcer of right great toe, unspecified ulcer stage (HCC)    Sleep apnea    CPAP   Wears glasses    Wears partial dentures      Objective: Physical Exam General: The patient is alert and oriented x3 in no acute distress.  Dermatology: Ulcer noted to the distal tip of the right second toe measuring approximately 0.7x0.7x0.2m. Wound base is granular. Significant improvement noted since last week. No malodor. No drainage.   Vascular: Palpable pedal pulses bilaterally. No edema or erythema noted. Capillary refill within normal limits.  Neurological: Light touch and protective threshold diminished  Musculoskeletal Exam: All pedal and ankle joints range of motion within normal limits bilateral. Muscle strength 5/5 in all groups bilateral.  Reducible hammertoe contracture deformity noted to the  symptomatic toe  Assessment: 1.  Reducible hammertoe second digit right foot 2.  Ulcer distal tip of the second digit right foot 3.  Prior history of right great toe amputation 4.  Diabetes mellitus with peripheral polyneuropathy   Plan of Care:  1. Patient evaluated.  2.  Different treatment options were discussed with the patient. 3.  After evaluating the patient I do believe that a flexor tenotomy to the respective digit would help alleviate the patient's symptoms.  This would lift the toe to alleviate pressure from the digit.  The procedure was explained in detail and all patient questions were answered.  No guarantees were expressed or implied.  The patient consented for correction here in the office 4.  Prior to procedure the toe was blocked in a digital block fashion using 3 mL of lidocaine 2%. 5.  Flexor tenotomy was performed of the respective digit using a surgical #11 scalpel and a small percutaneous stab incision on the plantar sulcus of the toe.  The toe was immediately in a more rectus position.  Betadine soaked dry sterile dressing was applied. 6.  Post care instructions were provided 7.  Surgical shoe dispensed 8.  Return to clinic in 1 week    BEdrick Kins DPM [facility]  Dr. BEdrick Kins DPM  [address], [place] [address]                Office [phone]  Fax [phone]

## 2022-08-29 NOTE — Assessment & Plan Note (Signed)
Differentials include MSK involvement, cyst, lipoma.  Checking xray of ribs today and US soft tissue chest ordered and pending.

## 2022-08-29 NOTE — Assessment & Plan Note (Signed)
Exam without alarm signs.  [place] of left breast pending.

## 2022-08-29 NOTE — Progress Notes (Signed)
Subjective:    Patient ID: Lawrence [name], male    DOB: Jan 22, 1951, 71 y.o.   MRN: [id number]  HPI  Lawrence [name] is a very pleasant 71 y.o. male with a significant medical history including CAD, hypertension, stable angina, OSA, type 1 diabetes, osteoarthritis, CKD, morbid obesity who presents today to discuss upper extremity pain.  Symptom onset about 2-4 weeks ago. His pain is intermittent and located to the left breast and left lateral chest for which he describes as a "catch or stabbing pain". His pain is provoked by palpation and movement. He believes he's noticed swelling to the left lateral breast.   He denies erythema, injury/trauma, overdoing any physical activity, . He leads a mostly sedentary life.   He has a chronic, dry cough for years, coughing spells intermittently throughout the day. Cough is worse since Spring 2023. Prior smoker, stopped smoking in 1987.    Review of Systems  Respiratory:  Negative for shortness of breath.   Cardiovascular:  Negative for chest pain.  Musculoskeletal:        Left sided chest wall pain and left breast pain  Skin:  Negative for color change.         Past Medical History:  Diagnosis Date   Allergies    Bronchitis    PMH   CAD (coronary artery disease)    07/2007   Diabetes (HCC)    Elevated lipids    Glaucoma    HBP (high blood pressure)    Headache    Myocardial infarction (HCC)    Neuropathy    Osteoarthritis    PAD (peripheral artery disease) ([place])    a. 03/2022 Lower Ext Angio: No signif Ao-iliac dzs. R PT diff dzs and occluded above ankle. R Pedal arch intact w/ excellent antegrade flow provided by R AT-->no indication for revasc.   Skin cancer    Skin ulcer of right great toe, unspecified ulcer stage (HCC)    Sleep apnea    CPAP   Wears glasses    Wears partial dentures     Social History   Socioeconomic History   Marital status: Married    Spouse name: Not on file   Number of children: Not on file    Years of education: Not on file   Highest education level: Not on file  Occupational History   Not on file  Tobacco Use   Smoking status: Former    Packs/day: 1.00    Years: 16.00    Total pack years: 16.00    Types: Cigarettes    Start date: 11/03/1969    Quit date: 07/22/1986    Years since quitting: 36.1   Smokeless tobacco: Never  Vaping Use   Vaping Use: Never used  Substance and Sexual Activity   Alcohol use: Yes    Comment: occasionally   Drug use: No   Sexual activity: Never  Other Topics Concern   Not on file  Social History Narrative   Married.  Lives at home with wife.    Social Determinants of Health   Financial Resource Strain: Low Risk  (10/10/2021)   Overall Financial Resource Strain (CARDIA)    Difficulty of Paying Living Expenses: Not hard at all  Food Insecurity: No Food Insecurity (10/10/2021)   Hunger Vital Sign    Worried About Running Out of Food in the Last Year: Never true    Ran Out of Food in the Last Year: Never true  Transportation Needs: No Transportation  Needs (10/10/2021)   PRAPARE - Hydrologist (Medical): No    Lack of Transportation (Non-Medical): No  Physical Activity: Inactive (10/10/2021)   Exercise Vital Sign    Days of Exercise per Week: 0 days    Minutes of Exercise per Session: 0 min  Stress: No Stress Concern Present (10/10/2021)   [place]    Feeling of Stress : Not at all  Social Connections: Moderately Integrated (10/10/2021)   Social Connection and Isolation Panel [NHANES]    Frequency of Communication with Friends and Family: More than three times a week    Frequency of Social Gatherings with Friends and Family: More than three times a week    Attends Religious Services: 1 to 4 times per year    Active Member of [employer] or Organizations: No    Attends Archivist Meetings: Never    Marital Status: Married  Human resources officer  Violence: Not At Risk (10/10/2021)   Humiliation, Afraid, Rape, and Kick questionnaire    Fear of Current or Ex-Partner: No    Emotionally Abused: No    Physically Abused: No    Sexually Abused: No    Past Surgical History:  Procedure Laterality Date   ABDOMINAL AORTOGRAM W/LOWER EXTREMITY N/A 03/26/2022   Procedure: ABDOMINAL AORTOGRAM W/LOWER EXTREMITY;  Surgeon: Wellington Hampshire, MD;  Location: [facility] [facility];  Service: Cardiovascular;  Laterality: N/A;   APPENDECTOMY     CARDIAC CATHETERIZATION N/A 06/11/2016   Procedure: Left Heart Cath and Coronary Angiography;  Surgeon: Corey Skains, MD;  Location: [facility];  Service: Cardiovascular;  Laterality: N/A;   CARPAL TUNNEL RELEASE Left 04/24/2016   Procedure: CARPAL TUNNEL RELEASE;  Surgeon: Hessie Knows, MD;  Location: [facility];  Service: Orthopedics;  Laterality: Left;   CATARACT EXTRACTION W/ INTRAOCULAR LENS  IMPLANT, BILATERAL Bilateral    CATARACT EXTRACTION, BILATERAL     R eye 07/16/12, L eye 08/13/12 - with lens implant in both eyes   CORONARY ARTERY BYPASS GRAFT N/A 06/27/2016   Procedure: CORONARY ARTERY BYPASS GRAFTING times four using left internal mammary artery and right leg saphenous vein;  Surgeon: Ivin Poot, MD;  Location: [place] [facility];  Service: Open Heart Surgery;  Laterality: N/A;   EYE SURGERY Bilateral    JOINT REPLACEMENT     KNEE ARTHROPLASTY Left 02/11/2016   Procedure: COMPUTER ASSISTED TOTAL KNEE ARTHROPLASTY;  Surgeon: Dereck Leep, MD;  Location: [facility];  Service: Orthopedics;  Laterality: Left;   KNEE ARTHROSCOPY Right    LEFT HEART CATH AND CORS/GRAFTS ANGIOGRAPHY Left 01/13/2017   Procedure: Left Heart Cath and Cors/Grafts Angiography;  Surgeon: Corey Skains, MD;  Location: [facility];  Service: Cardiovascular;  Laterality: Left;   METATARSAL HEAD EXCISION Right 03/21/2020   Procedure: METATARSAL HEAD RESECTION RIGHT AND DEBRIDEMENT OF ULCER;  Surgeon: [name], DPM;   Location: [facility];  Service: Podiatry;  Laterality: Right;   MULTIPLE TOOTH EXTRACTIONS     TEE WITHOUT CARDIOVERSION N/A 06/27/2016   Procedure: TRANSESOPHAGEAL ECHOCARDIOGRAM (TEE);  Surgeon: Ivin Poot, MD;  Location: [facility];  Service: Open Heart Surgery;  Laterality: N/A;    Family History  Problem Relation Age of Onset   Hypertension Mother    Lupus Mother    Heart Problems Mother    Hypertension Father    Diabetes Father    Heart attack Father 29  died in his 50s   Drug abuse Brother    Heart disease Brother    Heart attack Maternal Grandmother    Arthritis Maternal Grandmother    Arthritis Maternal Grandfather    Heart attack Paternal Grandmother    Arthritis Paternal Grandmother    Arthritis Paternal Grandfather     Allergies  Allergen Reactions   Nitroglycerin Nausea Only and Other (See Comments)    Patches only - headache    Statins Other (See Comments)    BODY PAIN, Pt taking Crestor*      Fluvastatin Other (See Comments)    Other reaction(s): Muscle pain   Fluvastatin Sodium     Other reaction(s): Myalgias   Rosuvastatin Other (See Comments)    Other reaction(s): Muscle pain, Myalgias   Simvastatin Other (See Comments)    Other reaction(s): Muscle pain, Myalgias   Ezetimibe Other (See Comments)    Other reaction(s): Muscle Pain   Lipitor [Atorvastatin] Other (See Comments) and Rash    BODY PAIN   Lisinopril Other (See Comments) and Nausea Only    Light headed, dizzy Other reaction(s): Dizziness    Current Outpatient Medications on File Prior to Visit  Medication Sig Dispense Refill   AIMOVIG 140 MG/ML SOAJ Inject into the skin.     aspirin 81 MG EC tablet Take 81 mg by mouth daily. Swallow whole.     Calcium-Magnesium-Vitamin D (CALCIUM 1200+D3 PO) Take 1 tablet by mouth daily. D3 25 mcg     cholecalciferol (VITAMIN D) 1000 units tablet Take 2,000 Units by mouth in the morning and at bedtime.      clopidogrel (PLAVIX) 75 MG tablet Take 1  tablet (75 mg total) by mouth daily. 30 tablet 0   collagenase (SANTYL) 250 UNIT/GM ointment Apply 1 application  topically daily.     cyanocobalamin (VITAMIN B12) 500 MCG tablet Take 500 mcg by mouth daily.     famotidine (PEPCID) 20 MG tablet Take 1 tablet (20 mg total) by mouth daily. 30 tablet 0   fluticasone (FLONASE) 50 MCG/ACT nasal spray Place 1 spray into both nostrils daily as needed for allergies.     furosemide (LASIX) 40 MG tablet Take 1 tablet (40 mg total) by mouth daily. 30 tablet 1   glucagon 1 MG injection Inject 1 mg into the vein once as needed. Reported on 02/11/2016     GLUTOSE 15 40 % GEL 1 Tube once as needed for low blood sugar.     insulin aspart (NOVOLOG) 100 UNIT/ML injection Inject 20 Units into the skin 3 (three) times daily. (Patient taking differently: Inject 10-20 Units into the skin 3 (three) times daily with meals. Sliding Scale) 10 mL 5   insulin glargine-yfgn (SEMGLEE) 100 UNIT/ML injection 20 Units at bedtime.     isosorbide mononitrate (IMDUR) 60 MG 24 hr tablet Take 60 mg by mouth every evening.     loratadine (CLARITIN) 10 MG tablet Take 10 mg by mouth daily.     magnesium oxide (MAG-OX) 400 MG tablet Take 400 mg by mouth daily.     metoprolol tartrate (LOPRESSOR) 25 MG tablet Take 25 mg by mouth 2 (two) times daily.     nitroGLYCERIN (NITROSTAT) 0.4 MG SL tablet DISSOLVE 1 TABLET UNDER THE TONGUE EVERY 5 MINUTES AS NEEDED FOR CHEST PAIN 30 tablet 0   potassium chloride SA (K-DUR,KLOR-CON) 20 MEQ tablet Take 1 tablet (20 mEq total) by mouth daily. 30 tablet 1   rosuvastatin (CRESTOR) 20 MG tablet Take  1 tablet (20 mg total) by mouth daily at 6 PM. 30 tablet 1   sulfamethoxazole-trimethoprim (BACTRIM DS) 800-160 MG tablet Take 1 tablet by mouth 2 (two) times daily. 20 tablet 0   Travoprost, BAK Free, (TRAVATAN) 0.004 % SOLN ophthalmic solution Place 1 drop into both eyes at bedtime.      triamcinolone ointment (KENALOG) 0.1 % Apply 1 application. topically  daily as needed (rash).     vitamin C (ASCORBIC ACID) 500 MG tablet Take 500 mg by mouth 2 (two) times daily.      raNITIdine HCl (ACID REDUCER PO) Take 1 tablet by mouth daily. (Patient not taking: Reported on 08/29/2022)     No current facility-administered medications on file prior to visit.    BP 138/76   Pulse 60   Temp 98.1 F (36.7 C) (Temporal)   Ht '5\' 10"'$  (1.778 m)   Wt (!) 312 lb (141.5 kg)   SpO2 99%   BMI 44.77 kg/m  Objective:   Physical Exam Pulmonary:     Effort: Pulmonary effort is normal.  Chest:  Breasts:    Left: Tenderness present. No swelling or skin change.       Comments: Tenderness to left lateral chest at site of 5th rib. Tenderness to left breast at 4-5 o'clock position.   Questionable soft, immobile, tender mass to left lateral chest wall at 5th rib.  Lymphadenopathy:     Upper Body:     Left upper body: No axillary adenopathy.  Skin:    General: Skin is warm and dry.  Neurological:     Mental Status: He is alert.           Assessment & Plan:   Problem List Items Addressed This Visit       Endocrine   Type 1 diabetes mellitus with hyperglycemia ([place])   Relevant Orders   Microalbumin / creatinine urine ratio     Other   Left-sided chest wall pain - Primary    Differentials include MSK involvement, cyst, lipoma.  Checking xray of ribs today and US soft tissue chest ordered and pending.        Relevant Orders   DG Ribs Unilateral Left   [place] CHEST SOFT TISSUE   Breast pain, left    Exam without alarm signs.  [place] of left breast pending.       Relevant Orders   US BREAST LTD UNI LEFT INC AXILLA       Pleas Koch, NP

## 2022-09-02 ENCOUNTER — Other Ambulatory Visit: Payer: Self-pay | Admitting: Primary Care

## 2022-09-02 DIAGNOSIS — N644 Mastodynia: Secondary | ICD-10-CM

## 2022-09-05 ENCOUNTER — Ambulatory Visit (INDEPENDENT_AMBULATORY_CARE_PROVIDER_SITE_OTHER): Payer: Medicare Other | Admitting: Podiatry

## 2022-09-05 ENCOUNTER — Encounter: Payer: Self-pay | Admitting: Podiatry

## 2022-09-05 DIAGNOSIS — M25569 Pain in unspecified knee: Secondary | ICD-10-CM | POA: Insufficient documentation

## 2022-09-05 DIAGNOSIS — L97522 Non-pressure chronic ulcer of other part of left foot with fat layer exposed: Secondary | ICD-10-CM

## 2022-09-05 NOTE — Progress Notes (Signed)
Chief Complaint  Patient presents with   Toe Pain    Follow up 2nd toe right - tenotomy and ulcer - it hasn't really bothered me, hopefully its doing good"    Subjective:  Patient presents today status post flexor tenotomy performed in office of the right second toe on 08/29/2022.  Patient states that he is doing well.  The toe is in a rectus alignment.  He has been wearing the postsurgical shoe as instructed.  Past Medical History:  Diagnosis Date   Allergies    Bronchitis    PMH   CAD (coronary artery disease)    07/2007   Diabetes (HCC)    Elevated lipids    Glaucoma    HBP (high blood pressure)    Headache    Myocardial infarction (HCC)    Neuropathy    Osteoarthritis    PAD (peripheral artery disease) ([place])    a. 03/2022 Lower Ext Angio: No signif Ao-iliac dzs. R PT diff dzs and occluded above ankle. R Pedal arch intact w/ excellent antegrade flow provided by R AT-->no indication for revasc.   Skin cancer    Skin ulcer of right great toe, unspecified ulcer stage ([place])    Sleep apnea    CPAP   Wears glasses    Wears partial dentures     Past Surgical History:  Procedure Laterality Date   ABDOMINAL AORTOGRAM W/LOWER EXTREMITY N/A 03/26/2022   Procedure: ABDOMINAL AORTOGRAM W/LOWER EXTREMITY;  Surgeon: Wellington Hampshire, MD;  Location: [facility];  Service: Cardiovascular;  Laterality: N/A;   APPENDECTOMY     CARDIAC CATHETERIZATION N/A 06/11/2016   Procedure: Left Heart Cath and Coronary Angiography;  Surgeon: Corey Skains, MD;  Location: [facility];  Service: Cardiovascular;  Laterality: N/A;   CARPAL TUNNEL RELEASE Left 04/24/2016   Procedure: CARPAL TUNNEL RELEASE;  Surgeon: Hessie Knows, MD;  Location: [facility];  Service: Orthopedics;  Laterality: Left;   CATARACT EXTRACTION W/ INTRAOCULAR LENS  IMPLANT, BILATERAL Bilateral    CATARACT EXTRACTION, BILATERAL     R eye 07/16/12, L eye 08/13/12 - with lens implant in both eyes   CORONARY ARTERY  BYPASS GRAFT N/A 06/27/2016   Procedure: CORONARY ARTERY BYPASS GRAFTING times four using left internal mammary artery and right leg saphenous vein;  Surgeon: Ivin Poot, MD;  Location: [facility];  Service: Open Heart Surgery;  Laterality: N/A;   EYE SURGERY Bilateral    JOINT REPLACEMENT     KNEE ARTHROPLASTY Left 02/11/2016   Procedure: COMPUTER ASSISTED TOTAL KNEE ARTHROPLASTY;  Surgeon: Dereck Leep, MD;  Location: [facility];  Service: Orthopedics;  Laterality: Left;   KNEE ARTHROSCOPY Right    LEFT HEART CATH AND CORS/GRAFTS ANGIOGRAPHY Left 01/13/2017   Procedure: Left Heart Cath and Cors/Grafts Angiography;  Surgeon: Corey Skains, MD;  Location: [facility];  Service: Cardiovascular;  Laterality: Left;   METATARSAL HEAD EXCISION Right 03/21/2020   Procedure: METATARSAL HEAD RESECTION RIGHT AND DEBRIDEMENT OF ULCER;  Surgeon: [name], DPM;  Location: [facility];  Service: Podiatry;  Laterality: Right;   MULTIPLE TOOTH EXTRACTIONS     TEE WITHOUT CARDIOVERSION N/A 06/27/2016   Procedure: TRANSESOPHAGEAL ECHOCARDIOGRAM (TEE);  Surgeon: Ivin Poot, MD;  Location: [facility];  Service: Open Heart Surgery;  Laterality: N/A;    Allergies  Allergen Reactions   Nitroglycerin Nausea Only and Other (See Comments)    Patches only - headache    Statins  Other (See Comments)    BODY PAIN, Pt taking Crestor*      Fluvastatin Other (See Comments)    Other reaction(s): Muscle pain   Fluvastatin Sodium     Other reaction(s): Myalgias   Rosuvastatin Other (See Comments)    Other reaction(s): Muscle pain, Myalgias   Simvastatin Other (See Comments)    Other reaction(s): Muscle pain, Myalgias   Ezetimibe Other (See Comments)    Other reaction(s): Muscle Pain   Lipitor [Atorvastatin] Other (See Comments) and Rash    BODY PAIN   Lisinopril Other (See Comments) and Nausea Only    Light headed, dizzy Other reaction(s): Dizziness    Objective/Physical Exam Toes in a rectus  alignment.  The percutaneous skin incision to the plantar sulcus of the toe has healed.  There continues to be a small superficial ulcer to the distal tip of the right second toe.  Assessment: 1. s/p flexor tenotomy second toe right in office 08/29/2022.    Plan of Care:  1. Patient was evaluated.  2.  Continue gentamicin cream and a light Band-Aid to the distal tip ulceration.  This should heal now that the pressure to the distal tip of the toe has been resolved with the flexor tenotomy 3.  Patient may transition into regular tennis shoes.  Ensure to allow plenty of room in the toebox area 4.  Return to clinic next scheduled appointment for routine foot care, approximately 4 weeks   [name], DPM [facility]  Dr. [name], DPM    [address], [place] [address]                Office [phone]  Fax [phone]

## 2022-09-08 ENCOUNTER — Ambulatory Visit
Admission: RE | Admit: 2022-09-08 | Discharge: 2022-09-08 | Disposition: A | Discharge status: Home / Self Care | Payer: Medicare Other | Source: Ambulatory Visit | Attending: Primary Care | Admitting: Primary Care

## 2022-09-08 DIAGNOSIS — R0789 Other chest pain: Secondary | ICD-10-CM | POA: Diagnosis not present

## 2022-09-08 DIAGNOSIS — R079 Chest pain, unspecified: Secondary | ICD-10-CM | POA: Diagnosis not present

## 2022-09-12 ENCOUNTER — Ambulatory Visit
Admission: RE | Admit: 2022-09-12 | Discharge: 2022-09-12 | Disposition: A | Discharge status: Home / Self Care | Payer: Medicare Other | Source: Ambulatory Visit | Attending: Primary Care | Admitting: Primary Care

## 2022-09-12 DIAGNOSIS — N644 Mastodynia: Secondary | ICD-10-CM

## 2022-09-12 DIAGNOSIS — R92313 Mammographic fatty tissue density, bilateral breasts: Secondary | ICD-10-CM | POA: Diagnosis not present

## 2022-09-12 DIAGNOSIS — N6323 Unspecified lump in the left breast, lower outer quadrant: Secondary | ICD-10-CM | POA: Diagnosis not present

## 2022-10-02 ENCOUNTER — Telehealth: Payer: Self-pay | Admitting: Podiatry

## 2022-10-02 NOTE — Telephone Encounter (Signed)
Pt states that his 2nd toe ulcer is not looking good. Its purple and he is concerned. He wants to be seen tomorrow in the [place] location. No opening on the schedule at this time.   Please advise.

## 2022-10-02 NOTE — Telephone Encounter (Signed)
Okay from my standopint to get him in whenever. - Dr. Amalia Hailey

## 2022-10-03 ENCOUNTER — Ambulatory Visit (INDEPENDENT_AMBULATORY_CARE_PROVIDER_SITE_OTHER): Payer: Medicare Other | Admitting: Podiatry

## 2022-10-03 DIAGNOSIS — E0843 Diabetes mellitus due to underlying condition with diabetic autonomic (poly)neuropathy: Secondary | ICD-10-CM

## 2022-10-03 DIAGNOSIS — L97512 Non-pressure chronic ulcer of other part of right foot with fat layer exposed: Secondary | ICD-10-CM

## 2022-10-03 DIAGNOSIS — E1351 Other specified diabetes mellitus with diabetic peripheral angiopathy without gangrene: Secondary | ICD-10-CM | POA: Diagnosis not present

## 2022-10-03 NOTE — Progress Notes (Signed)
No chief complaint on file.   Subjective:  71 y.o. male with PMHx of diabetes mellitus presenting today for worsening ulcer to the distal tip of the right second toe.  He does have a history of flexor tenotomy to the right second toe.  Patient's spouse became concerned due to discoloration with callus over the distal tip of the toe.  Denies any recent history of injury.  They have been applying gentamicin cream   Past Medical History:  Diagnosis Date   Allergies    Bronchitis    PMH   CAD (coronary artery disease)    07/2007   Diabetes (HCC)    Elevated lipids    Glaucoma    HBP (high blood pressure)    Headache    Myocardial infarction (HCC)    Neuropathy    Osteoarthritis    PAD (peripheral artery disease) ([place])    a. 03/2022 Lower Ext Angio: No signif Ao-iliac dzs. R PT diff dzs and occluded above ankle. R Pedal arch intact w/ excellent antegrade flow provided by R AT-->no indication for revasc.   Skin cancer    Skin ulcer of right great toe, unspecified ulcer stage ([place])    Sleep apnea    CPAP   Wears glasses    Wears partial dentures     Past Surgical History:  Procedure Laterality Date   ABDOMINAL AORTOGRAM W/LOWER EXTREMITY N/A 03/26/2022   Procedure: ABDOMINAL AORTOGRAM W/LOWER EXTREMITY;  Surgeon: Wellington Hampshire, MD;  Location: [facility];  Service: Cardiovascular;  Laterality: N/A;   APPENDECTOMY     CARDIAC CATHETERIZATION N/A 06/11/2016   Procedure: Left Heart Cath and Coronary Angiography;  Surgeon: Corey Skains, MD;  Location: [facility];  Service: Cardiovascular;  Laterality: N/A;   CARPAL TUNNEL RELEASE Left 04/24/2016   Procedure: CARPAL TUNNEL RELEASE;  Surgeon: Hessie Knows, MD;  Location: [facility];  Service: Orthopedics;  Laterality: Left;   CATARACT EXTRACTION W/ INTRAOCULAR LENS  IMPLANT, BILATERAL Bilateral    CATARACT EXTRACTION, BILATERAL     R eye 07/16/12, L eye 08/13/12 - with lens implant in both eyes   CORONARY ARTERY  BYPASS GRAFT N/A 06/27/2016   Procedure: CORONARY ARTERY BYPASS GRAFTING times four using left internal mammary artery and right leg saphenous vein;  Surgeon: Ivin Poot, MD;  Location: [facility];  Service: Open Heart Surgery;  Laterality: N/A;   EYE SURGERY Bilateral    JOINT REPLACEMENT     KNEE ARTHROPLASTY Left 02/11/2016   Procedure: COMPUTER ASSISTED TOTAL KNEE ARTHROPLASTY;  Surgeon: Dereck Leep, MD;  Location: [facility];  Service: Orthopedics;  Laterality: Left;   KNEE ARTHROSCOPY Right    LEFT HEART CATH AND CORS/GRAFTS ANGIOGRAPHY Left 01/13/2017   Procedure: Left Heart Cath and Cors/Grafts Angiography;  Surgeon: Corey Skains, MD;  Location: [facility];  Service: Cardiovascular;  Laterality: Left;   METATARSAL HEAD EXCISION Right 03/21/2020   Procedure: METATARSAL HEAD RESECTION RIGHT AND DEBRIDEMENT OF ULCER;  Surgeon: [name], DPM;  Location: [facility];  Service: Podiatry;  Laterality: Right;   MULTIPLE TOOTH EXTRACTIONS     TEE WITHOUT CARDIOVERSION N/A 06/27/2016   Procedure: TRANSESOPHAGEAL ECHOCARDIOGRAM (TEE);  Surgeon: Ivin Poot, MD;  Location: [facility];  Service: Open Heart Surgery;  Laterality: N/A;    Allergies  Allergen Reactions   Nitroglycerin Nausea Only and Other (See Comments)    Patches only - headache    Statins Other (See Comments)  BODY PAIN, Pt taking Crestor*      Fluvastatin Other (See Comments)    Other reaction(s): Muscle pain   Fluvastatin Sodium     Other reaction(s): Myalgias   Rosuvastatin Other (See Comments)    Other reaction(s): Muscle pain, Myalgias   Simvastatin Other (See Comments)    Other reaction(s): Muscle pain, Myalgias   Ezetimibe Other (See Comments)    Other reaction(s): Muscle Pain   Lipitor [Atorvastatin] Other (See Comments) and Rash    BODY PAIN   Lisinopril Other (See Comments) and Nausea Only    Light headed, dizzy Other reaction(s): Dizziness        Objective/Physical Exam General: The  patient is alert and oriented x3 in no acute distress.  Dermatology:  Wound #1 noted encompassing the distal tip of the right second toe measuring approximately 2.0 x 2.0 x 0.2 cm (LxWxD).  Wound appears stable.  Please see above noted photo  To the noted ulceration(s), there is no eschar. There is a moderate amount of slough, fibrin, and necrotic tissue noted. Granulation tissue and wound base is red. There is a minimal amount of serosanguineous drainage noted. There is no exposed bone muscle-tendon ligament or joint. There is no malodor. Periwound integrity is intact. Skin is warm, dry and supple bilateral lower extremities.  Vascular: Skin is cool to touch. VAS [place] LOWER EXTREMITY ARTERIAL BILATERAL 03/18/2022  +----------+--------+-----+--------+----------+--------+  RIGHT    PSV cm/sRatioStenosisWaveform  Comments  +----------+--------+-----+--------+----------+--------+  CFA Distal131                  triphasic           +----------+--------+-----+--------+----------+--------+  DFA      91                   biphasic            +----------+--------+-----+--------+----------+--------+  SFA Prox  82                   triphasic           +----------+--------+-----+--------+----------+--------+  SFA Mid   111                  triphasic           +----------+--------+-----+--------+----------+--------+  SFA Distal126                  monophasic          +----------+--------+-----+--------+----------+--------+  POP Prox  114                  monophasic          +----------+--------+-----+--------+----------+--------+  POP Mid                occluded                    +----------+--------+-----+--------+----------+--------+  POP Distal             occluded                    +----------+--------+-----+--------+----------+--------+  ATA Mid   145                  monophasic           +----------+--------+-----+--------+----------+--------+  PTA Mid   36                   monophasic          +----------+--------+-----+--------+----------+--------+  PERO Mid  73                   monophasic          +----------+--------+-----+--------+----------+--------+       +----------+--------+-----+--------+----------+--------+  LEFT     PSV cm/sRatioStenosisWaveform  Comments  +----------+--------+-----+--------+----------+--------+  CFA Distal99                   biphasic            +----------+--------+-----+--------+----------+--------+  DFA      93                   triphasic           +----------+--------+-----+--------+----------+--------+  SFA Prox  97                   biphasic            +----------+--------+-----+--------+----------+--------+  SFA Mid   76                   biphasic            +----------+--------+-----+--------+----------+--------+  SFA Distal79                   biphasic            +----------+--------+-----+--------+----------+--------+  POP Prox  69                   biphasic            +----------+--------+-----+--------+----------+--------+  POP Mid   69                   biphasic            +----------+--------+-----+--------+----------+--------+  POP Distal113                  biphasic            +----------+--------+-----+--------+----------+--------+  TP Trunk  128                  biphasic            +----------+--------+-----+--------+----------+--------+  ATA Mid   49                                       +----------+--------+-----+--------+----------+--------+  PTA Mid   51                   monophasic          +----------+--------+-----+--------+----------+--------+  PERO Mid  107                                      +----------+--------+-----+--------+----------+--------+   Neurological: Light touch and protective threshold  diminished bilaterally.   Musculoskeletal Exam: Prior amputation right great toe with routine healing  Assessment: 1.  Ulcer right second toe secondary to diabetes mellitus 2.  Prior history of amputation right great toe 3.  Diabetes mellitus w/ peripheral neuropathy 4.  Peripheral vascular disease   Plan of Care:  1. Patient was evaluated. 2. medically necessary excisional debridement including subcutaneous tissue was performed using a tissue nipper and a chisel blade. Excisional debridement of all the necrotic nonviable tissue down to healthy bleeding viable tissue was performed with post-debridement measurements same as  pre-. 3. the wound was cleansed and dry sterile dressing applied. 4.  The right second toe does extend beyond the remaining digits and I believe this is causing the ulcer to develop which is causing friction and any closed toed shoes.  He would benefit from partial toe amputation to shorten the length of the toe and better alignment with the remaining digits.  Risk benefits advantages and disadvantages were explained.  No guarantees were expressed or implied. 5.  Authorization for surgery was initiated today.  Surgery will consist of partial toe amputation right second digit 6.  In the meantime recommend follow-up with vascular.  New order was placed for ABIs bilateral.  Recommend follow-up with Dr. Fletcher Anon.  7.  Return to clinic 2 weeks   [name], DPM [facility]  Dr. [name], DPM    [address], [place] [address]                Office [phone]  Fax [phone]

## 2022-10-06 ENCOUNTER — Telehealth: Payer: Self-pay | Admitting: Podiatry

## 2022-10-06 NOTE — Telephone Encounter (Signed)
DOS: 10/23/2022  Medicare Part A and B [employer] for Life  Amputation Toe Interphalangeal 2nd Rt ([address]29)  Prior authorization is not required for either insurances.

## 2022-10-07 ENCOUNTER — Ambulatory Visit (HOSPITAL_COMMUNITY)
Admission: RE | Admit: 2022-10-07 | Discharge: 2022-10-07 | Disposition: A | Discharge status: Home / Self Care | Payer: Medicare Other | Source: Ambulatory Visit | Attending: Cardiology | Admitting: Cardiology

## 2022-10-07 DIAGNOSIS — E1351 Other specified diabetes mellitus with diabetic peripheral angiopathy without gangrene: Secondary | ICD-10-CM | POA: Diagnosis not present

## 2022-10-14 ENCOUNTER — Ambulatory Visit (INDEPENDENT_AMBULATORY_CARE_PROVIDER_SITE_OTHER): Payer: Medicare Other | Admitting: Podiatry

## 2022-10-14 ENCOUNTER — Ambulatory Visit (INDEPENDENT_AMBULATORY_CARE_PROVIDER_SITE_OTHER): Payer: Medicare Other

## 2022-10-14 DIAGNOSIS — L97512 Non-pressure chronic ulcer of other part of right foot with fat layer exposed: Secondary | ICD-10-CM

## 2022-10-14 DIAGNOSIS — M79674 Pain in right toe(s): Secondary | ICD-10-CM | POA: Diagnosis not present

## 2022-10-14 DIAGNOSIS — M79675 Pain in left toe(s): Secondary | ICD-10-CM | POA: Diagnosis not present

## 2022-10-14 DIAGNOSIS — B351 Tinea unguium: Secondary | ICD-10-CM | POA: Diagnosis not present

## 2022-10-14 LAB — HM DIABETES FOOT EXAM

## 2022-10-14 MED ORDER — DOXYCYCLINE HYCLATE 100 MG PO TABS: 100.0000 mg | ORAL_TABLET | Freq: Two times a day (BID) | ORAL | 0 refills | Status: DC

## 2022-10-14 NOTE — Progress Notes (Signed)
Chief Complaint  Patient presents with   foot care    Patient is here for routine foot care.    Subjective:  71 y.o. male with PMHx of diabetes mellitus presenting today for follow-up evaluation of an ulcer to the distal tip of the right second toe.  They have been applying antibiotic cream with a light dressing daily.  Patient also requesting a nail trim today.  No new complaints at this time  Past Medical History:  Diagnosis Date   Allergies    Bronchitis    PMH   CAD (coronary artery disease)    07/2007   Diabetes (HCC)    Elevated lipids    Glaucoma    HBP (high blood pressure)    Headache    Myocardial infarction (HCC)    Neuropathy    Osteoarthritis    PAD (peripheral artery disease) ([place])    a. 03/2022 Lower Ext Angio: No signif Ao-iliac dzs. R PT diff dzs and occluded above ankle. R Pedal arch intact w/ excellent antegrade flow provided by R AT-->no indication for revasc.   Skin cancer    Skin ulcer of right great toe, unspecified ulcer stage ([place])    Sleep apnea    CPAP   Wears glasses    Wears partial dentures     Past Surgical History:  Procedure Laterality Date   ABDOMINAL AORTOGRAM W/LOWER EXTREMITY N/A 03/26/2022   Procedure: ABDOMINAL AORTOGRAM W/LOWER EXTREMITY;  Surgeon: Wellington Hampshire, MD;  Location: [facility];  Service: Cardiovascular;  Laterality: N/A;   APPENDECTOMY     CARDIAC CATHETERIZATION N/A 06/11/2016   Procedure: Left Heart Cath and Coronary Angiography;  Surgeon: Corey Skains, MD;  Location: [place] [facility];  Service: Cardiovascular;  Laterality: N/A;   CARPAL TUNNEL RELEASE Left 04/24/2016   Procedure: CARPAL TUNNEL RELEASE;  Surgeon: Hessie Knows, MD;  Location: [facility];  Service: Orthopedics;  Laterality: Left;   CATARACT EXTRACTION W/ INTRAOCULAR LENS  IMPLANT, BILATERAL Bilateral    CATARACT EXTRACTION, BILATERAL     R eye 07/16/12, L eye 08/13/12 - with lens implant in both eyes   CORONARY ARTERY BYPASS GRAFT N/A  06/27/2016   Procedure: CORONARY ARTERY BYPASS GRAFTING times four using left internal mammary artery and right leg saphenous vein;  Surgeon: Ivin Poot, MD;  Location: [facility];  Service: Open Heart Surgery;  Laterality: N/A;   EYE SURGERY Bilateral    JOINT REPLACEMENT     KNEE ARTHROPLASTY Left 02/11/2016   Procedure: COMPUTER ASSISTED TOTAL KNEE ARTHROPLASTY;  Surgeon: Dereck Leep, MD;  Location: [facility];  Service: Orthopedics;  Laterality: Left;   KNEE ARTHROSCOPY Right    LEFT HEART CATH AND CORS/GRAFTS ANGIOGRAPHY Left 01/13/2017   Procedure: Left Heart Cath and Cors/Grafts Angiography;  Surgeon: Corey Skains, MD;  Location: [place] [facility];  Service: Cardiovascular;  Laterality: Left;   METATARSAL HEAD EXCISION Right 03/21/2020   Procedure: METATARSAL HEAD RESECTION RIGHT AND DEBRIDEMENT OF ULCER;  Surgeon: [name], DPM;  Location: [place];  Service: Podiatry;  Laterality: Right;   MULTIPLE TOOTH EXTRACTIONS     TEE WITHOUT CARDIOVERSION N/A 06/27/2016   Procedure: TRANSESOPHAGEAL ECHOCARDIOGRAM (TEE);  Surgeon: Ivin Poot, MD;  Location: [place];  Service: Open Heart Surgery;  Laterality: N/A;    Allergies  Allergen Reactions   Nitroglycerin Nausea Only and Other (See Comments)    Patches only - headache    Statins Other (See Comments)  BODY PAIN, Pt taking Crestor*      Fluvastatin Other (See Comments)    Other reaction(s): Muscle pain   Fluvastatin Sodium     Other reaction(s): Myalgias   Rosuvastatin Other (See Comments)    Other reaction(s): Muscle pain, Myalgias   Simvastatin Other (See Comments)    Other reaction(s): Muscle pain, Myalgias   Ezetimibe Other (See Comments)    Other reaction(s): Muscle Pain   Lipitor [Atorvastatin] Other (See Comments) and Rash    BODY PAIN   Lisinopril Other (See Comments) and Nausea Only    Light headed, dizzy Other reaction(s): Dizziness       RT foot 10/14/2022  Objective/Physical Exam General: The  patient is alert and oriented x3 in no acute distress.  Dermatology:  Wound #1 noted encompassing the distal tip of the right second toe measuring approximately 2.0 x 2.0 x 0.2 cm (LxWxD).  Wound appears stable.  Please see above noted photo  To the noted ulceration(s), there is no eschar. There is a moderate amount of slough, fibrin, and necrotic tissue noted. Granulation tissue and wound base is red. There is a minimal amount of serosanguineous drainage noted. There is no exposed bone muscle-tendon ligament or joint. There is no malodor. Periwound integrity is intact. Skin is warm, dry and supple bilateral lower extremities.  Vascular: Skin is cool to touch. VAS [place] LOWER EXTREMITY ARTERIAL BILATERAL 10/07/2022 ABI Findings:  +---------+------------------+-----+-----------+----------+  Right   Rt Pressure (mmHg)IndexWaveform   Comment     +---------+------------------+-----+-----------+----------+  Brachial 164                                           +---------+------------------+-----+-----------+----------+  PTA     254               1.55 monophasic             +---------+------------------+-----+-----------+----------+  PERO    254               1.55 multiphasic            +---------+------------------+-----+-----------+----------+  DP      254               1.55 multiphasic            +---------+------------------+-----+-----------+----------+  Great Toe                                  amputation  +---------+------------------+-----+-----------+----------+   +---------+------------------+-----+-----------+-------+  Left    Lt Pressure (mmHg)IndexWaveform   Comment  +---------+------------------+-----+-----------+-------+  Brachial 162                                        +---------+------------------+-----+-----------+-------+  PTA     189               1.15 multiphasic          +---------+------------------+-----+-----------+-------+  PERO    254               1.55 monophasic          +---------+------------------+-----+-----------+-------+  DP      177               1.08 monophasic          +---------+------------------+-----+-----------+-------+  Great Toe112               0.68 Abnormal            +---------+------------------+-----+-----------+-------+   +-------+-----------+-----------+------------+------------+  ABI/TBIToday's ABIToday's TBIPrevious ABIPrevious TBI  +-------+-----------+-----------+------------+------------+  Right [place]         AMP.       0.75        0.29          +-------+-----------+-----------+------------+------------+  Left  [place]         0.68       0.87        0.89          +-------+-----------+-----------+------------+------------+  Summary:  Right: Resting right ankle-brachial index indicates noncompressible right  lower extremity arteries. Right great toe amputation.   Left: Resting left ankle-brachial index indicates noncompressible left  lower extremity arteries. The left toe-brachial index is abnormal.   Neurological: Light touch and protective threshold diminished bilaterally.   Musculoskeletal Exam: Prior amputation right great toe with routine healing  Radiographic exam RT foot 10/14/2022: No osseous erosions noted.  No acute fractures identified.  Radiographically there is no obvious indication of osteomyelitis however clinically there is high suspicion for this  Assessment: 1.  Ulcer right second toe secondary to diabetes mellitus 2.  Prior history of amputation right great toe 3.  Diabetes mellitus w/ peripheral neuropathy 4.  Peripheral vascular disease 5.  Likely osteomyelitis right second toe; currently stable  Plan of Care:  1. Patient was evaluated.  ABIs and x-rays reviewed 2.  Recommend Betadine wet-to-dry daily to the toe 3.  Prescription for doxycycline 100 mg 2 times  daily #20 4.  WBAT surgical shoe 5.  Partial toe amputation scheduled for 10/28/2022.  If his condition worsens in the meantime go immediately to the emergency department for admission and surgery inpatient 6.  Patient making a follow-up with Dr. Fletcher Anon.  ABIs abnormal 7.  Mechanical debridement of remaining nails to the bilateral feet was performed today using a nail nipper without incident or bleeding  8.  Return to clinic 1 week postop   [name], DPM [facility]  Dr. [name], DPM    [address], [place] [address]                Office [phone]  Fax [phone]

## 2022-10-15 ENCOUNTER — Ambulatory Visit (INDEPENDENT_AMBULATORY_CARE_PROVIDER_SITE_OTHER): Payer: Medicare Other

## 2022-10-15 VITALS — Wt 312.0 lb

## 2022-10-15 DIAGNOSIS — Z0000 Encounter for general adult medical examination without abnormal findings: Secondary | ICD-10-CM | POA: Diagnosis not present

## 2022-10-15 DIAGNOSIS — L97512 Non-pressure chronic ulcer of other part of right foot with fat layer exposed: Secondary | ICD-10-CM | POA: Diagnosis not present

## 2022-10-15 NOTE — Patient Instructions (Signed)
Lawrence [name] , Thank you for taking time to come for your Medicare Wellness Visit. I appreciate your ongoing commitment to your health goals. Please review the following plan we discussed and let me know if I can assist you in the future.   These are the goals we discussed:  Goals      Increase water intake     Starting 09/05/2016, I will attempt to drink at least 6-8 glasses of water daily.      Patient Stated     Starting 04/19/2018, I will continue to take medications as prescribed.      Patient Stated     Would like to continue to lose weight      Patient Stated     Stay healthy and active         This is a list of the screening recommended for you and due dates:  Health Maintenance  Topic Date Due   DTaP/Tdap/Td vaccine (5 - Td or Tdap) 10/19/2018   COVID-19 Vaccine (4 - 2023-24 season) 07/04/2022   Cologuard (Stool DNA test)  10/23/2022   Hemoglobin A1C  12/19/2022   Yearly kidney function blood test for diabetes  03/25/2023   Eye exam for diabetics  05/27/2023   Yearly kidney health urinalysis for diabetes  08/30/2023   Complete foot exam   10/15/2023   Medicare Annual Wellness Visit  10/16/2023   Pneumonia Vaccine  Completed   Flu Shot  Completed   Hepatitis C Screening: USPSTF Recommendation to screen - Ages 18-79 yo.  Completed   Zoster (Shingles) Vaccine  Completed   HPV Vaccine  Aged Out    Advanced directives: Please bring a copy of your health care power of attorney and living will to the office at your convenience.  Conditions/risks identified: stay healthy and active   Next appointment: Follow up in one year for your annual wellness visit    Preventive Care 65 Years and Older, Male Preventive care refers to lifestyle choices and visits with your health care provider that can promote health and wellness. What does preventive care include? A yearly physical exam. This is also called an annual well check. Dental exams once or twice a year. Routine eye  exams. Ask your health care provider how often you should have your eyes checked. Personal lifestyle choices, including: Daily care of your teeth and gums. Regular physical activity. Eating a healthy diet. Avoiding tobacco and drug use. Limiting alcohol use. Practicing safe sex. Taking low-dose aspirin every day. Taking vitamin and mineral supplements as recommended by your health care provider. What happens during an annual well check? The services and screenings done by your health care provider during your annual well check will depend on your age, overall health, lifestyle risk factors, and family history of disease. Counseling  Your health care provider may ask you questions about your: Alcohol use. Tobacco use. Drug use. Emotional well-being. Home and relationship well-being. Sexual activity. Eating habits. History of falls. Memory and ability to understand (cognition). Work and work Statistician. Reproductive health. Screening  You may have the following tests or measurements: Height, weight, and BMI. Blood pressure. Lipid and cholesterol levels. These may be checked every 5 years, or more frequently if you are over 75 years old. Skin check. Lung cancer screening. You may have this screening every year starting at age 36 if you have a 30-pack-year history of smoking and currently smoke or have quit within the past 15 years. Fecal occult blood test (FOBT) of  the stool. You may have this test every year starting at age 27. Flexible sigmoidoscopy or colonoscopy. You may have a sigmoidoscopy every 5 years or a colonoscopy every 10 years starting at age 93. Hepatitis C blood test. Hepatitis B blood test. Sexually transmitted disease (STD) testing. Diabetes screening. This is done by checking your blood sugar (glucose) after you have not eaten for a while (fasting). You may have this done every 1-3 years. Bone density scan. This is done to screen for osteoporosis. You may have  this done starting at age 8. Mammogram. This may be done every 1-2 years. Talk to your health care provider about how often you should have regular mammograms. Talk with your health care provider about your test results, treatment options, and if necessary, the need for more tests. Vaccines  Your health care provider may recommend certain vaccines, such as: Influenza vaccine. This is recommended every year. Tetanus, diphtheria, and acellular pertussis (Tdap, Td) vaccine. You may need a Td booster every 10 years. Zoster vaccine. You may need this after age 61. Pneumococcal 13-valent conjugate (PCV13) vaccine. One dose is recommended after age 52. Pneumococcal polysaccharide (PPSV23) vaccine. One dose is recommended after age 16. Talk to your health care provider about which screenings and vaccines you need and how often you need them. This information is not intended to replace advice given to you by your health care provider. Make sure you discuss any questions you have with your health care provider. Document Released: 11/16/2015 Document Revised: 07/09/2016 Document Reviewed: 08/21/2015 Elsevier Interactive Patient Education  2017 [employer] Prevention in the Home Falls can cause injuries. They can happen to people of all ages. There are many things you can do to make your home safe and to help prevent falls. What can I do on the outside of my home? Regularly fix the edges of walkways and driveways and fix any cracks. Remove anything that might make you trip as you walk through a door, such as a raised step or threshold. Trim any bushes or trees on the path to your home. Use bright outdoor lighting. Clear any walking paths of anything that might make someone trip, such as rocks or tools. Regularly check to see if handrails are loose or broken. Make sure that both sides of any steps have handrails. Any raised decks and porches should have guardrails on the edges. Have any leaves,  snow, or ice cleared regularly. Use sand or salt on walking paths during winter. Clean up any spills in your garage right away. This includes oil or grease spills. What can I do in the bathroom? Use night lights. Install grab bars by the toilet and in the tub and shower. Do not use towel bars as grab bars. Use non-skid mats or decals in the tub or shower. If you need to sit down in the shower, use a plastic, non-slip stool. Keep the floor dry. Clean up any water that spills on the floor as soon as it happens. Remove soap buildup in the tub or shower regularly. Attach bath mats securely with double-sided non-slip rug tape. Do not have throw rugs and other things on the floor that can make you trip. What can I do in the bedroom? Use night lights. Make sure that you have a light by your bed that is easy to reach. Do not use any sheets or blankets that are too big for your bed. They should not hang down onto the floor. Have a firm chair  that has side arms. You can use this for support while you get dressed. Do not have throw rugs and other things on the floor that can make you trip. What can I do in the kitchen? Clean up any spills right away. Avoid walking on wet floors. Keep items that you use a lot in easy-to-reach places. If you need to reach something above you, use a strong step stool that has a grab bar. Keep electrical cords out of the way. Do not use floor polish or wax that makes floors slippery. If you must use wax, use non-skid floor wax. Do not have throw rugs and other things on the floor that can make you trip. What can I do with my stairs? Do not leave any items on the stairs. Make sure that there are handrails on both sides of the stairs and use them. Fix handrails that are broken or loose. Make sure that handrails are as long as the stairways. Check any carpeting to make sure that it is firmly attached to the stairs. Fix any carpet that is loose or worn. Avoid having throw  rugs at the top or bottom of the stairs. If you do have throw rugs, attach them to the floor with carpet tape. Make sure that you have a light switch at the top of the stairs and the bottom of the stairs. If you do not have them, ask someone to add them for you. What else can I do to help prevent falls? Wear shoes that: Do not have high heels. Have rubber bottoms. Are comfortable and fit you well. Are closed at the toe. Do not wear sandals. If you use a stepladder: Make sure that it is fully opened. Do not climb a closed stepladder. Make sure that both sides of the stepladder are locked into place. Ask someone to hold it for you, if possible. Clearly mark and make sure that you can see: Any grab bars or handrails. First and last steps. Where the edge of each step is. Use tools that help you move around (mobility aids) if they are needed. These include: Canes. Walkers. Scooters. Crutches. Turn on the lights when you go into a dark area. Replace any light bulbs as soon as they burn out. Set up your furniture so you have a clear path. Avoid moving your furniture around. If any of your floors are uneven, fix them. If there are any pets around you, be aware of where they are. Review your medicines with your doctor. Some medicines can make you feel dizzy. This can increase your chance of falling. Ask your doctor what other things that you can do to help prevent falls. This information is not intended to replace advice given to you by your health care provider. Make sure you discuss any questions you have with your health care provider. Document Released: 08/16/2009 Document Revised: 03/27/2016 Document Reviewed: 11/24/2014 Elsevier Interactive Patient Education  2017 [employer].

## 2022-10-15 NOTE — Progress Notes (Signed)
I connected with  [name] on 10/15/22 by a audio enabled telemedicine application and verified that I am speaking with the correct person using two identifiers.  Patient Location: Home  Provider Location: Home Office  I discussed the limitations of evaluation and management by telemedicine. The patient expressed understanding and agreed to proceed.   Subjective:   Lawrence [name] is a 71 y.o. male who presents for Medicare Annual/Subsequent preventive examination.  Review of Systems     Cardiac Risk Factors include: advanced age (>60mn, >>58women);hypertension;diabetes mellitus;dyslipidemia;male gender;obesity (BMI >30kg/m2)     Objective:    Today's Vitals   10/15/22 1419  Weight: (!) 312 lb (141.5 kg)   Body mass index is 44.77 kg/m.     10/15/2022    2:35 PM 03/26/2022    6:24 AM 10/10/2021    9:11 AM 03/21/2020   10:01 AM 04/19/2018   10:03 AM 04/23/2017   12:49 AM 04/22/2017    9:08 PM  Advanced Directives  Does Patient Have a Medical Advance Directive? Yes Yes Yes No Yes Yes Yes  Type of AParamedicof [place]oklyn HeightsLiving will [place]len[place] ParkLiving will [place] will  [place]Living will Living will;Healthcare Power of ALoch Lloyd Does patient want to make changes to medical advance directive?  No - Patient declined Yes (MAU/Ambulatory/Procedural Areas - Information given)   No - Patient declined   Copy of [place] Chart? No - copy requested Yes - validated most recent copy scanned in chart (See row information)   Yes Yes   Would patient like information on creating a medical advance directive?    No - Patient declined       Current Medications (verified) Outpatient Encounter Medications as of 10/15/2022  Medication Sig   AIMOVIG 140 MG/ML SOAJ Inject into the skin.   aspirin 81 MG EC tablet Take 81 mg by mouth daily. Swallow whole.    Calcium-Magnesium-Vitamin D (CALCIUM 1200+D3 PO) Take 1 tablet by mouth daily. D3 25 mcg   cholecalciferol (VITAMIN D) 1000 units tablet Take 2,000 Units by mouth in the morning and at bedtime.    clopidogrel (PLAVIX) 75 MG tablet Take 1 tablet (75 mg total) by mouth daily.   collagenase (SANTYL) 250 UNIT/GM ointment Apply 1 application  topically daily.   cyanocobalamin (VITAMIN B12) 500 MCG tablet Take 500 mcg by mouth daily.   doxycycline (VIBRA-TABS) 100 MG tablet Take 1 tablet (100 mg total) by mouth 2 (two) times daily.   famotidine (PEPCID) 20 MG tablet Take 1 tablet (20 mg total) by mouth daily.   fluticasone (FLONASE) 50 MCG/ACT nasal spray Place 1 spray into both nostrils daily as needed for allergies.   furosemide (LASIX) 40 MG tablet Take 1 tablet (40 mg total) by mouth daily.   insulin aspart (NOVOLOG) 100 UNIT/ML injection Inject 20 Units into the skin 3 (three) times daily. (Patient taking differently: Inject 8-20 Units into the skin 3 (three) times daily with meals. Sliding Scale)   insulin glargine-yfgn (SEMGLEE) 100 UNIT/ML injection 20 Units at bedtime.   isosorbide mononitrate (IMDUR) 60 MG 24 hr tablet Take 60 mg by mouth every evening.   loratadine (CLARITIN) 10 MG tablet Take 10 mg by mouth daily.   magnesium oxide (MAG-OX) 400 MG tablet Take 400 mg by mouth daily.   metoprolol tartrate (LOPRESSOR) 25 MG tablet Take 25 mg by mouth 2 (two) times daily.  nitroGLYCERIN (NITROSTAT) 0.4 MG SL tablet DISSOLVE 1 TABLET UNDER THE TONGUE EVERY 5 MINUTES AS NEEDED FOR CHEST PAIN   potassium chloride SA (K-DUR,KLOR-CON) 20 MEQ tablet Take 1 tablet (20 mEq total) by mouth daily.   raNITIdine HCl (ACID REDUCER PO) Take 1 tablet by mouth daily.   rosuvastatin (CRESTOR) 20 MG tablet Take 1 tablet (20 mg total) by mouth daily at 6 PM.   Travoprost, BAK Free, (TRAVATAN) 0.004 % SOLN ophthalmic solution Place 1 drop into both eyes at bedtime.    triamcinolone ointment (KENALOG) 0.1 %  Apply 1 application. topically daily as needed (rash).   vitamin C (ASCORBIC ACID) 500 MG tablet Take 500 mg by mouth 2 (two) times daily.    glucagon 1 MG injection Inject 1 mg into the vein once as needed. Reported on 02/11/2016 (Patient not taking: Reported on 10/15/2022)   GLUTOSE 15 40 % GEL 1 Tube once as needed for low blood sugar. (Patient not taking: Reported on 10/15/2022)   No facility-administered encounter medications on file as of 10/15/2022.    Allergies (verified) Nitroglycerin, Statins, Fluvastatin, Fluvastatin sodium, Rosuvastatin, Simvastatin, Ezetimibe, Lipitor [atorvastatin], and Lisinopril   History: Past Medical History:  Diagnosis Date   Allergies    Bronchitis    PMH   CAD (coronary artery disease)    07/2007   Diabetes ([place])    Elevated lipids    Glaucoma    HBP (high blood pressure)    Headache    Myocardial infarction (HCC)    Neuropathy    Osteoarthritis    PAD (peripheral artery disease) ([place])    a. 03/2022 Lower Ext Angio: No signif Ao-iliac dzs. R PT diff dzs and occluded above ankle. R Pedal arch intact w/ excellent antegrade flow provided by R AT-->no indication for revasc.   Skin cancer    Skin ulcer of right great toe, unspecified ulcer stage ([place])    Sleep apnea    CPAP   Wears glasses    Wears partial dentures    Past Surgical History:  Procedure Laterality Date   ABDOMINAL AORTOGRAM W/LOWER EXTREMITY N/A 03/26/2022   Procedure: ABDOMINAL AORTOGRAM W/LOWER EXTREMITY;  Surgeon: Wellington Hampshire, MD;  Location: [facility];  Service: Cardiovascular;  Laterality: N/A;   APPENDECTOMY     CARDIAC CATHETERIZATION N/A 06/11/2016   Procedure: Left Heart Cath and Coronary Angiography;  Surgeon: Corey Skains, MD;  Location: [facility];  Service: Cardiovascular;  Laterality: N/A;   CARPAL TUNNEL RELEASE Left 04/24/2016   Procedure: CARPAL TUNNEL RELEASE;  Surgeon: Hessie Knows, MD;  Location: [facility];  Service: Orthopedics;   Laterality: Left;   CATARACT EXTRACTION W/ INTRAOCULAR LENS  IMPLANT, BILATERAL Bilateral    CATARACT EXTRACTION, BILATERAL     R eye 07/16/12, L eye 08/13/12 - with lens implant in both eyes   CORONARY ARTERY BYPASS GRAFT N/A 06/27/2016   Procedure: CORONARY ARTERY BYPASS GRAFTING times four using left internal mammary artery and right leg saphenous vein;  Surgeon: Ivin Poot, MD;  Location: [facility];  Service: Open Heart Surgery;  Laterality: N/A;   EYE SURGERY Bilateral    JOINT REPLACEMENT     KNEE ARTHROPLASTY Left 02/11/2016   Procedure: COMPUTER ASSISTED TOTAL KNEE ARTHROPLASTY;  Surgeon: Dereck Leep, MD;  Location: [facility];  Service: Orthopedics;  Laterality: Left;   KNEE ARTHROSCOPY Right    LEFT HEART CATH AND CORS/GRAFTS ANGIOGRAPHY Left 01/13/2017   Procedure: Left Heart Cath and  Cors/Grafts Angiography;  Surgeon: Corey Skains, MD;  Location: [facility];  Service: Cardiovascular;  Laterality: Left;   METATARSAL HEAD EXCISION Right 03/21/2020   Procedure: METATARSAL HEAD RESECTION RIGHT AND DEBRIDEMENT OF ULCER;  Surgeon: [name], DPM;  Location: [facility];  Service: Podiatry;  Laterality: Right;   MULTIPLE TOOTH EXTRACTIONS     TEE WITHOUT CARDIOVERSION N/A 06/27/2016   Procedure: TRANSESOPHAGEAL ECHOCARDIOGRAM (TEE);  Surgeon: Ivin Poot, MD;  Location: [facility];  Service: Open Heart Surgery;  Laterality: N/A;   Family History  Problem Relation Age of Onset   Hypertension Mother    Lupus Mother    Heart Problems Mother    Hypertension Father    Diabetes Father    Heart attack Father 62       died in his 41s   Drug abuse Brother    Heart disease Brother    Heart attack Maternal Grandmother    Arthritis Maternal Grandmother    Arthritis Maternal Grandfather    Heart attack Paternal Grandmother    Arthritis Paternal Grandmother    Arthritis Paternal Grandfather    Social History   Socioeconomic History   Marital status: Married    Spouse name: Not  on file   Number of children: Not on file   Years of education: Not on file   Highest education level: Not on file  Occupational History   Not on file  Tobacco Use   Smoking status: Former    Packs/day: 1.00    Years: 16.00    Total pack years: 16.00    Types: Cigarettes    Start date: 11/03/1969    Quit date: 07/22/1986    Years since quitting: 36.2   Smokeless tobacco: Never  Vaping Use   Vaping Use: Never used  Substance and Sexual Activity   Alcohol use: Yes    Comment: occasionally   Drug use: No   Sexual activity: Never  Other Topics Concern   Not on file  Social History Narrative   Married.  Lives at home with wife.    Social Determinants of Health   Financial Resource Strain: Low Risk  (10/15/2022)   Overall Financial Resource Strain (CARDIA)    Difficulty of Paying Living Expenses: Not hard at all  Food Insecurity: No Food Insecurity (10/15/2022)   Hunger Vital Sign    Worried About Running Out of Food in the Last Year: Never true    Ran Out of Food in the Last Year: Never true  Transportation Needs: No Transportation Needs (10/15/2022)   PRAPARE - Hydrologist (Medical): No    Lack of Transportation (Non-Medical): No  Physical Activity: Inactive (10/15/2022)   Exercise Vital Sign    Days of Exercise per Week: 0 days    Minutes of Exercise per Session: 0 min  Stress: No Stress Concern Present (10/15/2022)   [place]    Feeling of Stress : Not at all  Social Connections: Moderately Integrated (10/15/2022)   Social Connection and Isolation Panel [NHANES]    Frequency of Communication with Friends and Family: More than three times a week    Frequency of Social Gatherings with Friends and Family: Twice a week    Attends Religious Services: 1 to 4 times per year    Active Member of [employer] or Organizations: No    Attends Archivist Meetings: Never    Marital  Status:  Married    Tobacco Counseling Counseling given: Not Answered   Clinical Intake:  Pre-visit preparation completed: Yes  Pain : No/denies pain     BMI - recorded: 44.77 Nutritional Status: BMI > 30  Obese Nutritional Risks: None Diabetes: Yes CBG done?: Yes (204 per pt) CBG resulted in Enter/ Edit results?: No Did pt. bring in CBG monitor from home?: No  How often do you need to have someone help you when you read instructions, pamphlets, or other written materials from your doctor or pharmacy?: 1 - Never  Diabetic?Nutrition Risk Assessment:  Has the patient had any N/V/D within the last 2 months?  No  Does the patient have any non-healing wounds?  No  Has the patient had any unintentional weight loss or weight gain?  No   Diabetes:  Is the patient diabetic?  Yes  If diabetic, was a CBG obtained today?  No  Did the patient bring in their glucometer from home?  No  How often do you monitor your CBG's? 4 x a day    Financial Strains and Diabetes Management:  Are you having any financial strains with the device, your supplies or your medication? No .  Does the patient want to be seen by Chronic Care Management for management of their diabetes?  No  Would the patient like to be referred to a Nutritionist or for Diabetic Management?  No   Diabetic Exams:  Diabetic Eye Exam: Completed 05/26/22 Diabetic Foot Exam: Overdue, Pt has been advised about the importance in completing this exam. Pt is scheduled for diabetic foot exam on next appt.   Interpreter Needed?: No  Information entered by :: Charlott Rakes, LPN   Activities of Daily Living    10/15/2022    2:37 PM 06/18/2022   11:14 AM  In your present state of health, do you have any difficulty performing the following activities:  Hearing? 1 0  Vision? 0 1  Comment  followed by [facility]  Difficulty concentrating or making decisions? 0 0  Walking or climbing stairs? 28 1  Comment old man shufffle    Dressing or bathing? 1 0  Comment my wife assistance   Doing errands, shopping? 0 1  Preparing Food and eating ? N   Using the Toilet? N   In the past six months, have you accidently leaked urine? Y   Comment wears a pad   Do you have problems with loss of bowel control? N   Managing your Medications? N   Managing your Finances? N   Housekeeping or managing your Housekeeping? N     Patient Care Team: Pleas Koch, NP as PCP - General (Internal Medicine)  Indicate any recent Medical Services you may have received from other than [facility] providers in the past year (date may be approximate).     Assessment:   This is a routine wellness examination for [place].  Hearing/Vision screen Hearing Screening - Comments:: Pt stated slight loss  Vision Screening - Comments:: Pt follows up with [facility] for annul eye exams   Dietary issues and exercise activities discussed: Current Exercise Habits: The patient does not participate in regular exercise at present   Goals Addressed             This Visit's Progress    Patient Stated       Stay healthy and active        Depression Screen    10/15/2022    2:29 PM 06/18/2022  11:13 AM 10/10/2021    9:22 AM 03/22/2021   10:39 AM 04/25/2019   12:48 PM 04/19/2018    9:58 AM 07/15/2017    5:25 PM  PHQ 2/9 Scores  PHQ - 2 Score 2 0 0 1 0 2 2  PHQ- 9 Score 7 0  _0 Fall Risk    10/15/2022    2:36 PM 06/18/2022   11:14 AM 10/10/2021    9:19 AM 07/11/2020    9:16 AM 04/25/2019   12:52 PM  Fall Risk   Falls in the past year? 1 0 _1 Number falls in past yr: 1 0 0 0 0  Injury with Fall? 0 0 1  0  Risk for fall due to : Impaired mobility;Impaired balance/gait;Impaired vision Impaired balance/gait Impaired balance/gait  Other (Comment)  Follow up Falls prevention discussed Falls evaluation completed Falls prevention discussed  Education provided    FALL RISK PREVENTION PERTAINING TO THE HOME:  Any stairs in or around  the home? Yes  If so, are there any without handrails? No  Home free of loose throw rugs in walkways, pet beds, electrical cords, etc? Yes  Adequate lighting in your home to reduce risk of falls? Yes   ASSISTIVE DEVICES UTILIZED TO PREVENT FALLS:  Life alert? No  Use of a cane, walker or w/c? Yes  Grab bars in the bathroom? Yes  Shower chair or bench in shower? Yes  Elevated toilet seat or a handicapped toilet? Yes   TIMED UP AND GO:  Was the test performed? No .   Cognitive Function:    04/19/2018   10:02 AM 09/05/2016    1:42 PM  MMSE - Mini Mental State Exam  Orientation to time 5 5  Orientation to Place 5 5  Registration 3 3  Attention/ Calculation 0 0  Recall 3 3  Language- name 2 objects 0 0  Language- repeat 1 1  Language- follow 3 step command 3 3  Language- read & follow direction 0 0  Write a sentence 0 0  Copy design 0 0  Total score 20 20        10/15/2022    2:38 PM  6CIT Screen  What Year? 0 points  What month? 0 points  What time? 0 points  Count back from 20 0 points  Months in reverse 0 points  Repeat phrase 0 points  Total Score 0 points    Immunizations Immunization History  Administered Date(s) Administered   Fluad Quad(high Dose 65+) 07/11/2020, 07/18/2021   H1N1 10/19/2008   Influenza, High Dose Seasonal PF 08/06/2015, 08/29/2019   Influenza, Seasonal, Injecte, Preservative Fre 09/30/2010, 08/10/2012   Influenza,inj,Quad PF,6+ Mos 09/18/2017, 07/19/2018   Influenza-Unspecified 09/10/1998, 10/12/2000, 09/21/2001, 09/06/2002, 09/08/2003, 09/20/2004, 10/14/2005, 10/06/2006, 08/13/2007, 07/18/2008, 08/17/2009, 08/04/2011, 09/03/2013, 08/25/2014, 08/01/2016, 08/05/2017, 07/20/2018, 07/11/2020, 08/03/2021   Moderna Sars-Covid-2 Vaccination 04/13/2020, 05/07/2020, 11/14/2020   Pneumococcal Conjugate-13 01/09/2015   Pneumococcal Polysaccharide-23 02/05/1998, 02/20/2003, 08/10/2012, 11/24/2017   Pneumococcal-Unspecified 11/04/2015   Td  11/03/1992, 10/19/2008   Td (Adult),unspecified 11/03/1992, 10/19/2008   Tdap 10/19/2008   Zoster Recombinat (Shingrix) 05/13/2021, 07/09/2021   Zoster, Live 09/30/2011    TDAP status: Due, Education has been provided regarding the importance of this vaccine. Advised may receive this vaccine at local pharmacy or Health Dept. Aware to provide a copy of the vaccination record if obtained from local pharmacy or Health Dept. Verbalized acceptance and understanding.  Flu Vaccine status: Up to date  Pneumococcal vaccine status: Up to date  Covid-19 vaccine status: Completed vaccines  Qualifies for Shingles Vaccine? Yes   Zostavax completed Yes   Shingrix Completed?: Yes  Screening Tests Health Maintenance  Topic Date Due   DTaP/Tdap/Td (5 - Td or Tdap) 10/19/2018   COVID-19 Vaccine (4 - 2023-24 season) 07/04/2022   Fecal DNA (Cologuard)  10/23/2022   HEMOGLOBIN A1C  12/19/2022   Diabetic kidney evaluation - eGFR measurement  03/25/2023   OPHTHALMOLOGY EXAM  05/27/2023   Diabetic kidney evaluation - Urine ACR  08/30/2023   FOOT EXAM  10/15/2023   Medicare Annual Wellness (AWV)  10/16/2023   Pneumonia Vaccine 58+ Years old  Completed   INFLUENZA VACCINE  Completed   Hepatitis C Screening  Completed   Zoster Vaccines- Shingrix  Completed   HPV VACCINES  Aged Out    Health Maintenance  Health Maintenance Due  Topic Date Due   DTaP/Tdap/Td (5 - Td or Tdap) 10/19/2018   COVID-19 Vaccine (4 - 2023-24 season) 07/04/2022    Colorectal cancer screening: Type of screening: Cologuard. Completed 10/24/19. Repeat every 3 years  Additional Screening:  Hepatitis C Screening: Completed 09/08/16  Vision Screening: Recommended annual ophthalmology exams for early detection of glaucoma and other disorders of the eye. Is the patient up to date with their annual eye exam?  Yes  Who is the provider or what is the name of the office in which the patient attends annual eye exams? [facility] If pt is not established with a provider, would they like to be referred to a provider to establish care? No .   Dental Screening: Recommended annual dental exams for proper oral hygiene  Community Resource Referral / Chronic Care Management: CRR required this visit?  No   CCM required this visit?  No      Plan:     I have personally reviewed and noted the following in the patient's chart:   Medical and social history Use of alcohol, tobacco or illicit drugs  Current medications and supplements including opioid prescriptions. Patient is not currently taking opioid prescriptions. Functional ability and status Nutritional status Physical activity Advanced directives List of other physicians Hospitalizations, surgeries, and ER visits in previous 12 months Vitals Screenings to include cognitive, depression, and falls Referrals and appointments  In addition, I have reviewed and discussed with patient certain preventive protocols, quality metrics, and best practice recommendations. A written personalized care plan for preventive services as well as general preventive health recommendations were provided to patient.     Willette Brace, LPN   [id number]   Nurse Notes: none

## 2022-10-20 LAB — WOUND CULTURE: Organism ID, Bacteria: NONE SEEN

## 2022-10-20 LAB — SPECIMEN STATUS REPORT

## 2022-10-28 DIAGNOSIS — M86471 Chronic osteomyelitis with draining sinus, right ankle and foot: Secondary | ICD-10-CM | POA: Diagnosis not present

## 2022-10-28 DIAGNOSIS — M869 Osteomyelitis, unspecified: Secondary | ICD-10-CM | POA: Diagnosis not present

## 2022-10-28 DIAGNOSIS — L97512 Non-pressure chronic ulcer of other part of right foot with fat layer exposed: Secondary | ICD-10-CM | POA: Diagnosis not present

## 2022-10-31 ENCOUNTER — Encounter: Payer: Medicare Other | Admitting: Podiatry

## 2022-11-07 ENCOUNTER — Encounter: Payer: Medicare Other | Admitting: Podiatry

## 2022-11-07 ENCOUNTER — Ambulatory Visit (INDEPENDENT_AMBULATORY_CARE_PROVIDER_SITE_OTHER): Payer: Medicare Other | Admitting: Podiatry

## 2022-11-07 ENCOUNTER — Ambulatory Visit (INDEPENDENT_AMBULATORY_CARE_PROVIDER_SITE_OTHER): Payer: Medicare Other

## 2022-11-07 DIAGNOSIS — Z9889 Other specified postprocedural states: Secondary | ICD-10-CM | POA: Diagnosis not present

## 2022-11-07 NOTE — Progress Notes (Signed)
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  DOS: 10/28/2022.  Patient doing well.  WBAT postsurgical shoe  Past Medical History:  Diagnosis Date   Allergies    Bronchitis    PMH   CAD (coronary artery disease)    07/2007   Diabetes (HCC)    Elevated lipids    Glaucoma    HBP (high blood pressure)    Headache    Myocardial infarction (HCC)    Neuropathy    Osteoarthritis    PAD (peripheral artery disease) ([place])    a. 03/2022 Lower Ext Angio: No signif Ao-iliac dzs. R PT diff dzs and occluded above ankle. R Pedal arch intact w/ excellent antegrade flow provided by R AT-->no indication for revasc.   Skin cancer    Skin ulcer of right great toe, unspecified ulcer stage ([place])    Sleep apnea    CPAP   Wears glasses    Wears partial dentures     Past Surgical History:  Procedure Laterality Date   ABDOMINAL AORTOGRAM W/LOWER EXTREMITY N/A 03/26/2022   Procedure: ABDOMINAL AORTOGRAM W/LOWER EXTREMITY;  Surgeon: Wellington Hampshire, MD;  Location: [facility];  Service: Cardiovascular;  Laterality: N/A;   APPENDECTOMY     CARDIAC CATHETERIZATION N/A 06/11/2016   Procedure: Left Heart Cath and Coronary Angiography;  Surgeon: Corey Skains, MD;  Location: [facility];  Service: Cardiovascular;  Laterality: N/A;   CARPAL TUNNEL RELEASE Left 04/24/2016   Procedure: CARPAL TUNNEL RELEASE;  Surgeon: Hessie Knows, MD;  Location: [facility];  Service: Orthopedics;  Laterality: Left;   CATARACT EXTRACTION W/ INTRAOCULAR LENS  IMPLANT, BILATERAL Bilateral    CATARACT EXTRACTION, BILATERAL     R eye 07/16/12, L eye 08/13/12 - with lens implant in both eyes   CORONARY ARTERY BYPASS GRAFT N/A 06/27/2016   Procedure: CORONARY ARTERY BYPASS GRAFTING times four using left internal mammary artery and right leg  saphenous vein;  Surgeon: Ivin Poot, MD;  Location: [facility];  Service: Open Heart Surgery;  Laterality: N/A;   EYE SURGERY Bilateral    JOINT REPLACEMENT     KNEE ARTHROPLASTY Left 02/11/2016   Procedure: COMPUTER ASSISTED TOTAL KNEE ARTHROPLASTY;  Surgeon: Dereck Leep, MD;  Location: [facility];  Service: Orthopedics;  Laterality: Left;   KNEE ARTHROSCOPY Right    LEFT HEART CATH AND CORS/GRAFTS ANGIOGRAPHY Left 01/13/2017   Procedure: Left Heart Cath and Cors/Grafts Angiography;  Surgeon: Corey Skains, MD;  Location: [facility];  Service: Cardiovascular;  Laterality: Left;   METATARSAL HEAD EXCISION Right 03/21/2020   Procedure: METATARSAL HEAD RESECTION RIGHT AND DEBRIDEMENT OF ULCER;  Surgeon: [name], DPM;  Location: [facility];  Service: Podiatry;  Laterality: Right;   MULTIPLE TOOTH EXTRACTIONS     TEE WITHOUT CARDIOVERSION N/A 06/27/2016   Procedure: TRANSESOPHAGEAL ECHOCARDIOGRAM (TEE);  Surgeon: Ivin Poot, MD;  Location: [facility];  Service: Open Heart Surgery;  Laterality: N/A;    Allergies  Allergen Reactions   Nitroglycerin Nausea Only and Other (See Comments)    Patches only - headache    Statins Other (See Comments)    BODY PAIN, Pt taking Crestor*      Fluvastatin Other (See Comments)    Other reaction(s): Muscle pain  Fluvastatin Sodium     Other reaction(s): Myalgias   Rosuvastatin Other (See Comments)    Other reaction(s): Muscle pain, Myalgias   Simvastatin Other (See Comments)    Other reaction(s): Muscle pain, Myalgias   Ezetimibe Other (See Comments)    Other reaction(s): Muscle Pain   Lipitor [Atorvastatin] Other (See Comments) and Rash    BODY PAIN   Lisinopril Other (See Comments) and Nausea Only    Light headed, dizzy Other reaction(s): Dizziness    Objective/Physical Exam Neurovascular status intact.  Incision well coapted with sutures intact. No sign of infectious process noted. No dehiscence. No active bleeding noted.   Moderate edema noted to the surgical toe  Radiographic Exam RT foot 11/07/2022:  Absence of the distal middle phalanx of the right second toe noted.  Prior amputations of the great toe also.  Assessment: 1. s/p partial toe amputation right second. DOS: 10/28/2022   Plan of Care:  1. Patient was evaluated. X-rays reviewed 2.  Dressings changed.  Continue WBAT postsurgical shoe 3.  RTC 1 week suture removal   [name], DPM [facility]  Dr. [name], DPM    [address], [place] [address]                Office [phone]  Fax [phone]

## 2022-11-18 ENCOUNTER — Ambulatory Visit (INDEPENDENT_AMBULATORY_CARE_PROVIDER_SITE_OTHER): Payer: Medicare Other | Admitting: Podiatry

## 2022-11-18 DIAGNOSIS — Z9889 Other specified postprocedural states: Secondary | ICD-10-CM

## 2022-11-18 NOTE — Progress Notes (Signed)
Chief Complaint  Patient presents with   Routine Post Op    POV #2 DOS 10/28/2022 RT 2ND PARTIAL TOE AMPUTATION, patient denies any pain, No N/V/F/C/SOB     Subjective:  Patient presents today status post partial toe amputation of the right second digit.  DOS: 10/28/2022.  Patient doing well.  WBAT postsurgical shoe  Past Medical History:  Diagnosis Date   Allergies    Bronchitis    PMH   CAD (coronary artery disease)    07/2007   Diabetes (HCC)    Elevated lipids    Glaucoma    HBP (high blood pressure)    Headache    Myocardial infarction (HCC)    Neuropathy    Osteoarthritis    PAD (peripheral artery disease) ([place])    a. 03/2022 Lower Ext Angio: No signif Ao-iliac dzs. R PT diff dzs and occluded above ankle. R Pedal arch intact w/ excellent antegrade flow provided by R AT-->no indication for revasc.   Skin cancer    Skin ulcer of right great toe, unspecified ulcer stage ([place])    Sleep apnea    CPAP   Wears glasses    Wears partial dentures     Past Surgical History:  Procedure Laterality Date   ABDOMINAL AORTOGRAM W/LOWER EXTREMITY N/A 03/26/2022   Procedure: ABDOMINAL AORTOGRAM W/LOWER EXTREMITY;  Surgeon: Wellington Hampshire, MD;  Location: [place] [facility];  Service: Cardiovascular;  Laterality: N/A;   APPENDECTOMY     CARDIAC CATHETERIZATION N/A 06/11/2016   Procedure: Left Heart Cath and Coronary Angiography;  Surgeon: Corey Skains, MD;  Location: [facility];  Service: Cardiovascular;  Laterality: N/A;   CARPAL TUNNEL RELEASE Left 04/24/2016   Procedure: CARPAL TUNNEL RELEASE;  Surgeon: Hessie Knows, MD;  Location: [facility];  Service: Orthopedics;  Laterality: Left;   CATARACT EXTRACTION W/ INTRAOCULAR LENS  IMPLANT, BILATERAL Bilateral    CATARACT EXTRACTION, BILATERAL     R eye 07/16/12, L eye 08/13/12 - with lens implant in both eyes   CORONARY ARTERY BYPASS GRAFT N/A 06/27/2016   Procedure: CORONARY ARTERY BYPASS GRAFTING times four using left  internal mammary artery and right leg saphenous vein;  Surgeon: Ivin Poot, MD;  Location: [facility];  Service: Open Heart Surgery;  Laterality: N/A;   EYE SURGERY Bilateral    JOINT REPLACEMENT     KNEE ARTHROPLASTY Left 02/11/2016   Procedure: COMPUTER ASSISTED TOTAL KNEE ARTHROPLASTY;  Surgeon: Dereck Leep, MD;  Location: [facility];  Service: Orthopedics;  Laterality: Left;   KNEE ARTHROSCOPY Right    LEFT HEART CATH AND CORS/GRAFTS ANGIOGRAPHY Left 01/13/2017   Procedure: Left Heart Cath and Cors/Grafts Angiography;  Surgeon: Corey Skains, MD;  Location: [facility];  Service: Cardiovascular;  Laterality: Left;   METATARSAL HEAD EXCISION Right 03/21/2020   Procedure: METATARSAL HEAD RESECTION RIGHT AND DEBRIDEMENT OF ULCER;  Surgeon: [name], DPM;  Location: [facility];  Service: Podiatry;  Laterality: Right;   MULTIPLE TOOTH EXTRACTIONS     TEE WITHOUT CARDIOVERSION N/A 06/27/2016   Procedure: TRANSESOPHAGEAL ECHOCARDIOGRAM (TEE);  Surgeon: Ivin Poot, MD;  Location: [facility];  Service: Open Heart Surgery;  Laterality: N/A;    Allergies  Allergen Reactions   Nitroglycerin Nausea Only and Other (See Comments)    Patches only - headache    Statins Other (See Comments)    BODY PAIN, Pt taking Crestor*      Fluvastatin Other (See Comments)  Other reaction(s): Muscle pain   Fluvastatin Sodium     Other reaction(s): Myalgias   Rosuvastatin Other (See Comments)    Other reaction(s): Muscle pain, Myalgias   Simvastatin Other (See Comments)    Other reaction(s): Muscle pain, Myalgias   Ezetimibe Other (See Comments)    Other reaction(s): Muscle Pain   Lipitor [Atorvastatin] Other (See Comments) and Rash    BODY PAIN   Lisinopril Other (See Comments) and Nausea Only    Light headed, dizzy Other reaction(s): Dizziness    Objective/Physical Exam Neurovascular status intact.  Incision well coapted with sutures intact. No sign of infectious process noted. No  dehiscence. No active bleeding noted.  Chronic bilateral lower extremity edema noted.  The edema localized around the toe has improved significantly  Radiographic Exam RT foot 11/07/2022:  Absence of the distal middle phalanx of the right second toe noted.  Prior amputations of the great toe also.  Assessment: 1. s/p partial toe amputation right second. DOS: 10/28/2022 -Patient evaluated.  Sutures removed -Betadine ointment provided to apply daily with a light Band-Aid -Patient may now transition out of the postsurgical shoe into good supportive sneakers -Return to clinic 3 weeks  [name], DPM [facility]  Dr. [name], DPM    [address], [place] [address]                Office [phone]  Fax [phone]

## 2022-11-25 ENCOUNTER — Encounter: Payer: Medicare Other | Admitting: Podiatry

## 2022-12-09 ENCOUNTER — Encounter: Payer: Self-pay | Admitting: Family Medicine

## 2022-12-09 ENCOUNTER — Encounter: Payer: Medicare Other | Admitting: Podiatry

## 2022-12-09 ENCOUNTER — Telehealth: Payer: Self-pay | Admitting: *Deleted

## 2022-12-09 ENCOUNTER — Ambulatory Visit (INDEPENDENT_AMBULATORY_CARE_PROVIDER_SITE_OTHER): Payer: Medicare Other | Admitting: Family Medicine

## 2022-12-09 VITALS — BP 130/78 | HR 70 | Temp 98.0°F | Ht 70.0 in | Wt 301.5 lb

## 2022-12-09 DIAGNOSIS — U071 COVID-19: Secondary | ICD-10-CM | POA: Insufficient documentation

## 2022-12-09 DIAGNOSIS — R0989 Other specified symptoms and signs involving the circulatory and respiratory systems: Secondary | ICD-10-CM | POA: Diagnosis not present

## 2022-12-09 DIAGNOSIS — I10 Essential (primary) hypertension: Secondary | ICD-10-CM

## 2022-12-09 HISTORY — DX: COVID-19: U07.1

## 2022-12-09 LAB — POCT INFLUENZA A/B
Influenza A, POC: NEGATIVE
Influenza B, POC: NEGATIVE

## 2022-12-09 LAB — POC COVID19 BINAXNOW: SARS Coronavirus 2 Ag: POSITIVE — AB

## 2022-12-09 MED ORDER — ALBUTEROL SULFATE HFA 108 (90 BASE) MCG/ACT IN AERS: 2.0000 | INHALATION_SPRAY | RESPIRATORY_TRACT | 0 refills | Status: AC | PRN

## 2022-12-09 MED ORDER — NIRMATRELVIR/RITONAVIR (PAXLOVID)TABLET: 3.0000 | ORAL_TABLET | Freq: Two times a day (BID) | ORAL | 0 refills | Status: AC

## 2022-12-09 NOTE — Patient Instructions (Addendum)
Take the paxlovid as directed  Hold your generic crestor while you are on it    Drink fluids  Drink fluids and rest  mucinex DM is good for cough and congestion  Nasal saline for congestion as needed  Tylenol for fever or pain or headache  Please alert [place] if symptoms worsen (if severe or short of breath please go to the ER)     If you get wheezing use the albuterol inhaler If wheezing persists let us know   Update if not starting to improve in a week or if worsening    You need to isolate 5 days minimum , until symptoms are better   Then mask for 10 more days when you are out of isolation

## 2022-12-09 NOTE — Assessment & Plan Note (Signed)
Otc cold med may have inc bp Is improved today BP: 130/78

## 2022-12-09 NOTE — Progress Notes (Signed)
Subjective:    Patient ID: Lawrence [name], male    DOB: 02/06/1951, 72 y.o.   MRN: [id number]  HPI 72 yo pf of NP [place] presents for uri symptoms   Wt Readings from Last 3 Encounters:  12/09/22 (!) 301 lb 8 oz (136.8 kg)  10/15/22 (!) 312 lb (141.5 kg)  08/29/22 (!) 312 lb (141.5 kg)   43.26 kg/m  Vitals:   12/09/22 1230  BP: 130/78  Pulse: 70  Temp: 98 F (36.7 C)  SpO2: 97%   Symptoms started Friday night   Covid test is positive today   Cough - phlegm - is yellow and green  Wheezes (not unusual for him) , used to smoke 1970 to 1987   Nasal congestion   Body aches  Exhausted    Temp may be mildly elevated   No n/v/d   Otc Nyquil Day quil   Lab Results  Component Value Date   CREATININE 1.27 (H) 03/24/2022   BUN 26 (H) 03/24/2022   NA 136 03/24/2022   K 4.7 03/24/2022   CL 101 03/24/2022   CO2 28 03/24/2022   GFR 60   Results for orders placed or performed in visit on 12/09/22  POC COVID-19  Result Value Ref Range   SARS Coronavirus 2 Ag Positive (A) Negative  POCT Influenza A/B  Result Value Ref Range   Influenza A, POC Negative Negative   Influenza B, POC Negative Negative     Patient Active Problem List   Diagnosis Date Noted   COVID-19 12/09/2022   Pain in joint, lower leg 09/05/2022   Left-sided chest wall pain 08/29/2022   Breast pain, left 08/29/2022   Stage 3a chronic kidney disease ([place]) 12/26/2021   Dizziness 04/30/2021   Chest tightness 03/22/2021   History of MI (myocardial infarction) 07/11/2020   Pilonidal cyst 07/11/2020   Chronic foot ulcer ([place]) 03/08/2020   Preoperative clearance 03/08/2020   Pruritus 02/29/2020   Actinic keratosis 02/26/2020   Allergic rhinitis 02/26/2020   Basal cell carcinoma of skin 02/26/2020   ED (erectile dysfunction) of organic origin 02/26/2020   Hypersomnia with sleep apnea 02/26/2020   Low back pain 02/26/2020   Other specified malignant neoplasm of skin, site unspecified  02/26/2020   Other anxiety states 02/26/2020   Psychosexual dysfunction with inhibited sexual excitement 02/26/2020   Senile cataract 02/26/2020   Medicare annual wellness visit, subsequent 04/26/2019   Frequent headaches 11/04/2018   Bilateral leg edema 10/18/2018   Osteoarthritis 09/29/2017   Stable angina 04/22/2017   Bilateral carotid artery stenosis 12/10/2016   S/P CABG (coronary artery bypass graft) 06/27/2016   Type 1 diabetes mellitus with hyperglycemia ([place]) 04/04/2016   Combined fat and carbohydrate induced hyperlipemia 04/04/2016   Myocardial infarction ([place]) 04/04/2016   H/O total knee replacement 03/28/2016   S/P total knee arthroplasty 02/11/2016   Arthritis of knee, degenerative 11/05/2015   Hyperlipidemia 08/28/2015   CAD (coronary artery disease) 08/28/2015   Lower extremity edema 08/28/2015   GERD (gastroesophageal reflux disease) 08/28/2015   OSA (obstructive sleep apnea) 08/28/2015   Morbid obesity with BMI of 40.0-44.9, adult ([place]) 08/28/2015   Benign essential HTN 05/18/2015   Past Medical History:  Diagnosis Date   Allergies    Bronchitis    PMH   CAD (coronary artery disease)    07/2007   Diabetes ([place])    Elevated lipids    Glaucoma    HBP (high blood pressure)    Headache  Myocardial infarction [facility])    Neuropathy    Osteoarthritis    PAD (peripheral artery disease) ([place])    a. 03/2022 Lower Ext Angio: No signif Ao-iliac dzs. R PT diff dzs and occluded above ankle. R Pedal arch intact w/ excellent antegrade flow provided by R AT-->no indication for revasc.   Skin cancer    Skin ulcer of right great toe, unspecified ulcer stage ([place])    Sleep apnea    CPAP   Wears glasses    Wears partial dentures    Past Surgical History:  Procedure Laterality Date   ABDOMINAL AORTOGRAM W/LOWER EXTREMITY N/A 03/26/2022   Procedure: ABDOMINAL AORTOGRAM W/LOWER EXTREMITY;  Surgeon: Wellington Hampshire, MD;  Location: [place] [facility];  Service:  Cardiovascular;  Laterality: N/A;   APPENDECTOMY     CARDIAC CATHETERIZATION N/A 06/11/2016   Procedure: Left Heart Cath and Coronary Angiography;  Surgeon: Corey Skains, MD;  Location: [place] [facility];  Service: Cardiovascular;  Laterality: N/A;   CARPAL TUNNEL RELEASE Left 04/24/2016   Procedure: CARPAL TUNNEL RELEASE;  Surgeon: Hessie Knows, MD;  Location: [facility];  Service: Orthopedics;  Laterality: Left;   CATARACT EXTRACTION W/ INTRAOCULAR LENS  IMPLANT, BILATERAL Bilateral    CATARACT EXTRACTION, BILATERAL     R eye 07/16/12, L eye 08/13/12 - with lens implant in both eyes   CORONARY ARTERY BYPASS GRAFT N/A 06/27/2016   Procedure: CORONARY ARTERY BYPASS GRAFTING times four using left internal mammary artery and right leg saphenous vein;  Surgeon: Ivin Poot, MD;  Location: [place];  Service: Open Heart Surgery;  Laterality: N/A;   EYE SURGERY Bilateral    JOINT REPLACEMENT     KNEE ARTHROPLASTY Left 02/11/2016   Procedure: COMPUTER ASSISTED TOTAL KNEE ARTHROPLASTY;  Surgeon: Dereck Leep, MD;  Location: [facility];  Service: Orthopedics;  Laterality: Left;   KNEE ARTHROSCOPY Right    LEFT HEART CATH AND CORS/GRAFTS ANGIOGRAPHY Left 01/13/2017   Procedure: Left Heart Cath and Cors/Grafts Angiography;  Surgeon: Corey Skains, MD;  Location: [place] [facility];  Service: Cardiovascular;  Laterality: Left;   METATARSAL HEAD EXCISION Right 03/21/2020   Procedure: METATARSAL HEAD RESECTION RIGHT AND DEBRIDEMENT OF ULCER;  Surgeon: [name], DPM;  Location: [place];  Service: Podiatry;  Laterality: Right;   MULTIPLE TOOTH EXTRACTIONS     TEE WITHOUT CARDIOVERSION N/A 06/27/2016   Procedure: TRANSESOPHAGEAL ECHOCARDIOGRAM (TEE);  Surgeon: Ivin Poot, MD;  Location: [place];  Service: Open Heart Surgery;  Laterality: N/A;   Social History   Tobacco Use   Smoking status: Former    Packs/day: 1.00    Years: 16.00    Total pack years: 16.00    Types: Cigarettes    Start  date: 11/03/1969    Quit date: 07/22/1986    Years since quitting: 36.4   Smokeless tobacco: Never  Vaping Use   Vaping Use: Never used  Substance Use Topics   Alcohol use: Yes    Comment: occasionally   Drug use: No   Family History  Problem Relation Age of Onset   Hypertension Mother    Lupus Mother    Heart Problems Mother    Hypertension Father    Diabetes Father    Heart attack Father 54       died in his 71s   Drug abuse Brother    Heart disease Brother    Heart attack Maternal Grandmother    Arthritis Maternal Grandmother  Arthritis Maternal Grandfather    Heart attack Paternal Grandmother    Arthritis Paternal Grandmother    Arthritis Paternal Grandfather    Allergies  Allergen Reactions   Nitroglycerin Nausea Only and Other (See Comments)    Patches only - headache    Statins Other (See Comments)    BODY PAIN, Pt taking Crestor*      Fluvastatin Other (See Comments)    Other reaction(s): Muscle pain   Fluvastatin Sodium     Other reaction(s): Myalgias   Rosuvastatin Other (See Comments)    Other reaction(s): Muscle pain, Myalgias   Simvastatin Other (See Comments)    Other reaction(s): Muscle pain, Myalgias   Ezetimibe Other (See Comments)    Other reaction(s): Muscle Pain   Lipitor [Atorvastatin] Other (See Comments) and Rash    BODY PAIN   Lisinopril Other (See Comments) and Nausea Only    Light headed, dizzy Other reaction(s): Dizziness   Current Outpatient Medications on File Prior to Visit  Medication Sig Dispense Refill   AIMOVIG 140 MG/ML SOAJ Inject into the skin.     aspirin 81 MG EC tablet Take 81 mg by mouth daily. Swallow whole.     Calcium-Magnesium-Vitamin D (CALCIUM 1200+D3 PO) Take 1 tablet by mouth daily. D3 25 mcg     cholecalciferol (VITAMIN D) 1000 units tablet Take 2,000 Units by mouth in the morning and at bedtime.      clopidogrel (PLAVIX) 75 MG tablet Take 1 tablet (75 mg total) by mouth daily. 30 tablet 0   cyanocobalamin  (VITAMIN B12) 500 MCG tablet Take 500 mcg by mouth daily.     famotidine (PEPCID) 20 MG tablet Take 1 tablet (20 mg total) by mouth daily. 30 tablet 0   fluticasone (FLONASE) 50 MCG/ACT nasal spray Place 1 spray into both nostrils daily as needed for allergies.     furosemide (LASIX) 40 MG tablet Take 1 tablet (40 mg total) by mouth daily. 30 tablet 1   glucagon 1 MG injection Inject 1 mg into the vein once as needed. Reported on 02/11/2016     GLUTOSE 15 40 % GEL 1 Tube once as needed for low blood sugar.     insulin aspart (NOVOLOG) 100 UNIT/ML injection Inject 20 Units into the skin 3 (three) times daily. (Patient taking differently: Inject 8-20 Units into the skin 3 (three) times daily with meals. Sliding Scale) 10 mL 5   insulin glargine-yfgn (SEMGLEE) 100 UNIT/ML injection 20 Units at bedtime.     isosorbide mononitrate (IMDUR) 60 MG 24 hr tablet Take 60 mg by mouth every evening.     loratadine (CLARITIN) 10 MG tablet Take 10 mg by mouth daily.     magnesium oxide (MAG-OX) 400 MG tablet Take 400 mg by mouth daily.     metoprolol tartrate (LOPRESSOR) 25 MG tablet Take 25 mg by mouth 2 (two) times daily.     nitroGLYCERIN (NITROSTAT) 0.4 MG SL tablet DISSOLVE 1 TABLET UNDER THE TONGUE EVERY 5 MINUTES AS NEEDED FOR CHEST PAIN 30 tablet 0   potassium chloride SA (K-DUR,KLOR-CON) 20 MEQ tablet Take 1 tablet (20 mEq total) by mouth daily. 30 tablet 1   raNITIdine HCl (ACID REDUCER PO) Take 1 tablet by mouth daily.     rosuvastatin (CRESTOR) 20 MG tablet Take 1 tablet (20 mg total) by mouth daily at 6 PM. 30 tablet 1   Travoprost, BAK Free, (TRAVATAN) 0.004 % SOLN ophthalmic solution Place 1 drop into both eyes at bedtime.  triamcinolone ointment (KENALOG) 0.1 % Apply 1 application. topically daily as needed (rash).     vitamin C (ASCORBIC ACID) 500 MG tablet Take 500 mg by mouth 2 (two) times daily.      No current facility-administered medications on file prior to visit.    Review of  Systems  Constitutional:  Positive for appetite change and fatigue. Negative for fever.  HENT:  Positive for congestion, postnasal drip, rhinorrhea, sinus pressure, sneezing and sore throat. Negative for ear pain.   Eyes:  Negative for pain and discharge.  Respiratory:  Positive for cough. Negative for shortness of breath, wheezing and stridor.   Cardiovascular:  Negative for chest pain.  Gastrointestinal:  Negative for diarrhea, nausea and vomiting.  Genitourinary:  Negative for frequency, hematuria and urgency.  Musculoskeletal:  Negative for arthralgias and myalgias.  Skin:  Negative for rash.  Neurological:  Positive for headaches. Negative for dizziness, weakness and light-headedness.       Has chronic headaches  Psychiatric/Behavioral:  Negative for confusion and dysphoric mood.        Objective:   Physical Exam Constitutional:      General: He is not in acute distress.    Appearance: Normal appearance. He is well-developed. He is obese. He is not ill-appearing, toxic-appearing or diaphoretic.  HENT:     Head: Normocephalic and atraumatic.     Comments: Nares are injected and congested      Right Ear: Tympanic membrane, ear canal and external ear normal.     Left Ear: Tympanic membrane, ear canal and external ear normal.     Nose: Congestion and rhinorrhea present.     Mouth/Throat:     Mouth: Mucous membranes are moist.     Pharynx: Oropharynx is clear. No oropharyngeal exudate or posterior oropharyngeal erythema.     Comments: Clear pnd  Eyes:     General:        Right eye: No discharge.        Left eye: No discharge.     Conjunctiva/sclera: Conjunctivae normal.     Pupils: Pupils are equal, round, and reactive to light.  Cardiovascular:     Rate and Rhythm: Normal rate.     Heart sounds: Normal heart sounds.  Pulmonary:     Effort: Pulmonary effort is normal. No respiratory distress.     Breath sounds: Normal breath sounds. No stridor. No wheezing, rhonchi or rales.      Comments: Good air exch No wheeze even on forced expiration  Chest:     Chest wall: No tenderness.  Musculoskeletal:     Cervical back: Normal range of motion and neck supple.  Lymphadenopathy:     Cervical: No cervical adenopathy.  Skin:    General: Skin is warm and dry.     Capillary Refill: Capillary refill takes less than 2 seconds.     Findings: No rash.  Neurological:     Mental Status: He is alert.     Cranial Nerves: No cranial nerve deficit.  Psychiatric:        Mood and Affect: Mood normal.           Assessment & Plan:   Problem List Items Addressed This Visit       Cardiovascular and Mediastinum   Benign essential HTN    Otc cold med may have inc bp Is improved today BP: 130/78          Other   COVID-19    Mild to moderate  symptoms , no sob or wheezing today  In light of age and risk factors, opted to tx with paxlovid  He was imm in past but not this fall Handout given  Disc sympt care-see AVS Px albuterol mdi to have if needed  Disc ER precautions  Watch for fever/sob/wheeze  Isolation min 5 d - when symptoms are better Then mask 10 more days  Handout given re: paxlovid and covid 19        Relevant Medications   nirmatrelvir/ritonavir (PAXLOVID) 20 x 150 MG & 10 x '100MG'$  TABS   Other Visit Diagnoses     Chest congestion    -  Primary   Relevant Orders   POC COVID-19 (Completed)   POCT Influenza A/B (Completed)

## 2022-12-09 NOTE — Patient Instructions (Addendum)
Visit Information  Thank you for taking time to visit with me today. Please don't hesitate to contact me if I can be of assistance to you.   Following are the goals we discussed today:   Goals Addressed             This Visit's Progress    care coordination activities       Care Coordination Interventions: Care Management program discussed-patient denied having any community resource needs SDOH screening completed Seen by PCP today -patient reports Covid positive status post partial toe amputation Patient  agreeable to follow up with [facility] for care coordination scheduled for 12/10/22           Please call the care guide team at [phone] if you need to cancel or reschedule your appointment.   If you are experiencing a Mental Health or [place] or need someone to talk to, please call 911   The patient verbalized understanding of instructions, educational materials, and care plan provided today and DECLINED offer to receive copy of patient instructions, educational materials, and care plan.   Telephone follow up appointment with care management team member scheduled for: 12/10/22 with [place], [place] Worker  [facility] Management [phone]

## 2022-12-09 NOTE — Assessment & Plan Note (Signed)
Mild to moderate symptoms , no sob or wheezing today  In light of age and risk factors, opted to tx with paxlovid  He was imm in past but not this fall Handout given  Disc sympt care-see AVS Px albuterol mdi to have if needed  Disc ER precautions  Watch for fever/sob/wheeze  Isolation min 5 d - when symptoms are better Then mask 10 more days  Handout given re: paxlovid and covid 19

## 2022-12-09 NOTE — Patient Outreach (Signed)
  Care Coordination   Care Coordination  Visit Note   12/09/2022 Name: SADAO WEYER MRN: [id number] DOB: 10-29-1951  HIREN PEPLINSKI is a 72 y.o. year old male who sees Pleas Koch, NP for primary care. I spoke with  [name] by phone today.  What matters to the patients health and wellness today?  Patient agreeable to care coordination services    Goals Addressed             This Visit's Progress    care coordination activities       Care Coordination Interventions: Care Management program discussed SDOH screening completed Seen by PCP today -patient reports Covid positive status post partial toe amputation Patient  agreeable to follow up with [facility] for care coordination scheduled for 12/10/22         SDOH assessments and interventions completed:  Yes  SDOH Interventions Today    Flowsheet Row Most Recent Value  SDOH Interventions   Food Insecurity Interventions Intervention Not Indicated  Housing Interventions Intervention Not Indicated  Transportation Interventions Intervention Not Indicated        Care Coordination Interventions:  Yes, provided   Follow up plan: Follow up call scheduled for 12/10/22 with RNCM    Encounter Outcome:  Pt. Visit Completed

## 2022-12-10 ENCOUNTER — Ambulatory Visit: Payer: Self-pay

## 2022-12-10 NOTE — Patient Outreach (Signed)
  Care Coordination   Initial Visit Note   12/10/2022 Name: Lawrence [name] MRN: [id number] DOB: October 13, 1951  Lawrence [name] is a 72 y.o. year old male who sees Pleas Koch, NP for primary care. I spoke with  [name] by phone today.  What matters to the patients health and wellness today?  Patient verbalized no nursing or community resource needs.  He states he does not feel he needs the services at this time.  He states he and his wife have quite a few provider appointments and he does not have time for a phone call.    Goals Addressed             This Visit's Progress    care coordination activities - no follow up       Care Coordination Interventions: Advised patient to schedule 6 month follow up with primary provider.  Discussed/ offered ongoing follow up with RNCM:  Patient states he does not feel he needs care coordination follow up at this time.  Patient advised to notify primary care provider if care coordination services needed in the future.          SDOH assessments and interventions completed:  No     Care Coordination Interventions:  Yes, provided   Follow up plan: No further intervention required.   Encounter Outcome:  Pt. Visit Completed   Quinn Plowman RN,BSN,CCM [place] [phone] direct line

## 2022-12-22 ENCOUNTER — Telehealth: Payer: Self-pay

## 2022-12-22 ENCOUNTER — Encounter: Payer: Self-pay | Admitting: Family Medicine

## 2022-12-22 ENCOUNTER — Ambulatory Visit (INDEPENDENT_AMBULATORY_CARE_PROVIDER_SITE_OTHER)
Admission: RE | Admit: 2022-12-22 | Discharge: 2022-12-22 | Disposition: A | Discharge status: Home / Self Care | Payer: Medicare Other | Source: Ambulatory Visit | Attending: Family Medicine | Admitting: Family Medicine

## 2022-12-22 ENCOUNTER — Ambulatory Visit (INDEPENDENT_AMBULATORY_CARE_PROVIDER_SITE_OTHER): Payer: Medicare Other | Admitting: Family Medicine

## 2022-12-22 VITALS — BP 116/62 | HR 80 | Temp 97.8°F | Ht 70.0 in | Wt 294.4 lb

## 2022-12-22 DIAGNOSIS — U071 COVID-19: Secondary | ICD-10-CM

## 2022-12-22 MED ORDER — AMOXICILLIN-POT CLAVULANATE 875-125 MG PO TABS: 1.0000 | ORAL_TABLET | Freq: Two times a day (BID) | ORAL | 0 refills | Status: DC

## 2022-12-22 NOTE — Telephone Encounter (Signed)
Lawrence [name] - Client TELEPHONE ADVICE RECORD [facility] Patient Name: Lawrence [name] Gender: Male DOB: [birth year] Age: 72 Y 11 M 27 D Return Phone Number: [phone] (Primary), [phone] (Secondary) Address: City/ State/ Zip: [place]  [address] Client [name] - Client Client Site [name] Provider Alma Friendly - NP Contact Type Call Who Is Calling Patient / Member / Family / Caregiver Call Type Triage / Clinical Caller Name Kilyn Pingleton Relationship To Patient Spouse Return Phone Number [phone] (Primary) Chief Complaint WHEEZING Reason for Call Symptomatic / Request for [name] states her husband was tested on the 6th for COVID and was positive, he took the medication they doctor gave him but he is not better. Caller states he is positive again for COVID, he is having the body aches, coughing and runny nose. Caller states the cough is not getting better, he is having trouble breathing and wheezing. Translation No Nurse Assessment Nurse: Kirk Ruths, RN, [facility] Date/Time (Eastern Time): 12/22/2022 8:06:31 AM Confirm and document reason for call. If symptomatic, describe symptoms. ---Caller states patient pos for covid on the 6th given paxlovid but the s/s continues severe cough not fever right now no shortness of breath no chest pain except with coughing body aches coughing up phlegm that is greenish in clolor Does the patient have any new or worsening symptoms? ---Yes Will a triage be completed? ---Yes Related visit to physician within the last 2 weeks? ---No Does the PT have any chronic conditions? (i.e. diabetes, asthma, this includes High risk factors for pregnancy, etc.) ---Yes List chronic conditions. ---surgeries, Is this a behavioral health or substance abuse call? ---No Guidelines Guideline Title Affirmed Question  Affirmed Notes Nurse Date/Time Eilene Ghazi Time) COVID-19 - Persisting Symptoms Follow-up Call [1] MILD difficulty breathing (e.g., minimal/no SOB Kirk Ruths, RN, [facility] 12/22/2022 8:09:59 AM PLEASE NOTE: All timestamps contained within this report are represented as [place] Standard Time. CONFIDENTIALTY NOTICE: This fax transmission is intended only for the addressee. It contains information that is legally privileged, confidential or otherwise protected from use or disclosure. If you are not the intended recipient, you are strictly prohibited from reviewing, disclosing, copying using or disseminating any of this information or taking any action in reliance on or regarding this information. If you have received this fax in error, please notify us immediately by telephone so that we can arrange for its return to [place]. Phone: [phone], Toll-Free: [phone], Fax: [phone] Page: 2 of 2 Call Id: [id number] Guidelines Guideline Title Affirmed Question Affirmed Notes Nurse Date/Time Eilene Ghazi Time) at rest, SOB with walking, pulse <100) AND [2] new-onset Disp. Time Eilene Ghazi Time) Disposition Final User 12/22/2022 8:03:52 AM Send to Urgent Ronie Spies, [place] 12/22/2022 8:12:59 AM See HCP within 4 Hours (or PCP triage) Yes Kirk Ruths, RN, [facility] Final Disposition 12/22/2022 8:12:59 AM See HCP within 4 Hours (or PCP triage) Yes Kirk Ruths, RN, [facility] Caller Disagree/Comply Comply Caller Understands Yes PreDisposition Call Doctor Care Advice Given Per Guideline SEE HCP (OR PCP TRIAGE) WITHIN 4 HOURS: * IF OFFICE WILL BE OPEN: You need to be seen within the next 3 or 4 hours. Call your doctor (or NP/PA) now or as soon as the office opens. * UCC: Some UCCs can manage patients who are stable and have less serious symptoms (e.g., minor illnesses and injuries). The triager must know the [facility] capabilities before sending a patient there. If unsure, call ahead. CALL BACK IF: * You  become worse CARE ADVICE  given per COVID-19 - Persisting Symptoms Follow-Up Call (Adult) guideline. Referrals REFERRED TO PCP OFFIC

## 2022-12-22 NOTE — Assessment & Plan Note (Addendum)
Pt presents for re check today  He did have some rebound in symptoms after finishing paxlovid this weekend  Reassuring exam Some sinus tenderness Some chest congestion  Cxr clear today  Will tx for bacterial sinusitis Update if not starting to improve in a week or if worsening  Symptom care discussed

## 2022-12-22 NOTE — Telephone Encounter (Signed)
Per chart review tab pt already has appt scheduled with Dr Glori Bickers 12/22/22 at 12:30. Sending note to Dr Glori Bickers and [employer].

## 2022-12-22 NOTE — Telephone Encounter (Signed)
Will see him then

## 2022-12-22 NOTE — Progress Notes (Signed)
Subjective:    Patient ID: Lawrence [name], male    DOB: [birth year], 72 y.o.   MRN: [id number]  HPI 72 yo pt of NP Clark presents for covid 19  Wt Readings from Last 3 Encounters:  12/22/22 294 lb 6 oz (133.5 kg)  12/09/22 (!) 301 lb 8 oz (136.8 kg)  10/15/22 (!) 312 lb (141.5 kg)   42.24 kg/m   Vitals:   12/22/22 1213  BP: 116/62  Pulse: 80  Temp: 97.8 F (36.6 C)  SpO2: 97%    Tested positive on 2/6   Headache - this is normal for him  Runny and stuffy nose  Some sinus pain   Mucous is green   Cough is bad  Some wheezing - has not used albuterol   Ears feel full , worse on the right  Scratchy throat  Some dizziness off and on   Some diarrhea on one day   No fever   Finished paxlovid  Had some rebound symptoms once finished it this weekend    Otc  Mucinex - cold and flu  Not using albuterol   DG Chest 2 View  Result Date: 12/22/2022 CLINICAL DATA:  Productive cough, shortness of breath, and occasional wheezing post COVID EXAM: CHEST - 2 VIEW COMPARISON:  08/29/2022 FINDINGS: Upper normal size of cardiac silhouette post CABG. Mediastinal contours and pulmonary vascularity normal. Lungs clear. No infiltrate, pleural effusion, or pneumothorax. Osseous structures unremarkable. IMPRESSION: No acute abnormalities. Electronically Signed   By: Lavonia Dana M.D.   On: 12/22/2022 13:13     Patient Active Problem List   Diagnosis Date Noted   COVID-19 12/09/2022   Pain in joint, lower leg 09/05/2022   Left-sided chest wall pain 08/29/2022   Breast pain, left 08/29/2022   Stage 3a chronic kidney disease ([place]) 12/26/2021   Dizziness 04/30/2021   Chest tightness 03/22/2021   History of MI (myocardial infarction) 07/11/2020   Pilonidal cyst 07/11/2020   Chronic foot ulcer ([place]) 03/08/2020   Preoperative clearance 03/08/2020   Pruritus 02/29/2020   Actinic keratosis 02/26/2020   Allergic rhinitis 02/26/2020   Basal cell carcinoma of skin 02/26/2020   ED  (erectile dysfunction) of organic origin 02/26/2020   Hypersomnia with sleep apnea 02/26/2020   Low back pain 02/26/2020   Other specified malignant neoplasm of skin, site unspecified 02/26/2020   Other anxiety states 02/26/2020   Psychosexual dysfunction with inhibited sexual excitement 02/26/2020   Senile cataract 02/26/2020   Medicare annual wellness visit, subsequent 04/26/2019   Frequent headaches 11/04/2018   Bilateral leg edema 10/18/2018   Osteoarthritis 09/29/2017   Stable angina 04/22/2017   Bilateral carotid artery stenosis 12/10/2016   S/P CABG (coronary artery bypass graft) 06/27/2016   Type 1 diabetes mellitus with hyperglycemia ([place]) 04/04/2016   Combined fat and carbohydrate induced hyperlipemia 04/04/2016   Myocardial infarction ([place]) 04/04/2016   H/O total knee replacement 03/28/2016   S/P total knee arthroplasty 02/11/2016   Arthritis of knee, degenerative 11/05/2015   Hyperlipidemia 08/28/2015   CAD (coronary artery disease) 08/28/2015   Lower extremity edema 08/28/2015   GERD (gastroesophageal reflux disease) 08/28/2015   OSA (obstructive sleep apnea) 08/28/2015   Morbid obesity with BMI of 40.0-44.9, adult ([place]) 08/28/2015   Benign essential HTN 05/18/2015   Past Medical History:  Diagnosis Date   Allergies    Bronchitis    PMH   CAD (coronary artery disease)    07/2007   Diabetes ([place])    Elevated lipids  Glaucoma    HBP (high blood pressure)    Headache    Myocardial infarction (HCC)    Neuropathy    Osteoarthritis    PAD (peripheral artery disease) ([place])    a. 03/2022 Lower Ext Angio: No signif Ao-iliac dzs. R PT diff dzs and occluded above ankle. R Pedal arch intact w/ excellent antegrade flow provided by R AT-->no indication for revasc.   Skin cancer    Skin ulcer of right great toe, unspecified ulcer stage ([place])    Sleep apnea    CPAP   Wears glasses    Wears partial dentures    Past Surgical History:  Procedure Laterality Date    ABDOMINAL AORTOGRAM W/LOWER EXTREMITY N/A 03/26/2022   Procedure: ABDOMINAL AORTOGRAM W/LOWER EXTREMITY;  Surgeon: Wellington Hampshire, MD;  Location: [facility];  Service: Cardiovascular;  Laterality: N/A;   APPENDECTOMY     CARDIAC CATHETERIZATION N/A 06/11/2016   Procedure: Left Heart Cath and Coronary Angiography;  Surgeon: Corey Skains, MD;  Location: [facility];  Service: Cardiovascular;  Laterality: N/A;   CARPAL TUNNEL RELEASE Left 04/24/2016   Procedure: CARPAL TUNNEL RELEASE;  Surgeon: Hessie Knows, MD;  Location: [facility];  Service: Orthopedics;  Laterality: Left;   CATARACT EXTRACTION W/ INTRAOCULAR LENS  IMPLANT, BILATERAL Bilateral    CATARACT EXTRACTION, BILATERAL     R eye 07/16/12, L eye 08/13/12 - with lens implant in both eyes   CORONARY ARTERY BYPASS GRAFT N/A 06/27/2016   Procedure: CORONARY ARTERY BYPASS GRAFTING times four using left internal mammary artery and right leg saphenous vein;  Surgeon: Ivin Poot, MD;  Location: [place];  Service: Open Heart Surgery;  Laterality: N/A;   EYE SURGERY Bilateral    JOINT REPLACEMENT     KNEE ARTHROPLASTY Left 02/11/2016   Procedure: COMPUTER ASSISTED TOTAL KNEE ARTHROPLASTY;  Surgeon: Dereck Leep, MD;  Location: [facility];  Service: Orthopedics;  Laterality: Left;   KNEE ARTHROSCOPY Right    LEFT HEART CATH AND CORS/GRAFTS ANGIOGRAPHY Left 01/13/2017   Procedure: Left Heart Cath and Cors/Grafts Angiography;  Surgeon: Corey Skains, MD;  Location: [facility];  Service: Cardiovascular;  Laterality: Left;   METATARSAL HEAD EXCISION Right 03/21/2020   Procedure: METATARSAL HEAD RESECTION RIGHT AND DEBRIDEMENT OF ULCER;  Surgeon: [name], DPM;  Location: [facility];  Service: Podiatry;  Laterality: Right;   MULTIPLE TOOTH EXTRACTIONS     TEE WITHOUT CARDIOVERSION N/A 06/27/2016   Procedure: TRANSESOPHAGEAL ECHOCARDIOGRAM (TEE);  Surgeon: Ivin Poot, MD;  Location: [facility];  Service: Open Heart Surgery;   Laterality: N/A;   Social History   Tobacco Use   Smoking status: Former    Packs/day: 1.00    Years: 16.00    Total pack years: 16.00    Types: Cigarettes    Start date: 11/03/1969    Quit date: 07/22/1986    Years since quitting: 36.4   Smokeless tobacco: Never  Vaping Use   Vaping Use: Never used  Substance Use Topics   Alcohol use: Yes    Comment: occasionally   Drug use: No   Family History  Problem Relation Age of Onset   Hypertension Mother    Lupus Mother    Heart Problems Mother    Hypertension Father    Diabetes Father    Heart attack Father 27       died in his 58s   Drug abuse Brother    Heart  disease Brother    Heart attack Maternal Grandmother    Arthritis Maternal Grandmother    Arthritis Maternal Grandfather    Heart attack Paternal Grandmother    Arthritis Paternal Grandmother    Arthritis Paternal Grandfather    Allergies  Allergen Reactions   Nitroglycerin Nausea Only and Other (See Comments)    Patches only - headache    Statins Other (See Comments)    BODY PAIN, Pt taking Crestor*      Fluvastatin Other (See Comments)    Other reaction(s): Muscle pain   Fluvastatin Sodium     Other reaction(s): Myalgias   Rosuvastatin Other (See Comments)    Other reaction(s): Muscle pain, Myalgias   Simvastatin Other (See Comments)    Other reaction(s): Muscle pain, Myalgias   Ezetimibe Other (See Comments)    Other reaction(s): Muscle Pain   Lipitor [Atorvastatin] Other (See Comments) and Rash    BODY PAIN   Lisinopril Other (See Comments) and Nausea Only    Light headed, dizzy Other reaction(s): Dizziness   Current Outpatient Medications on File Prior to Visit  Medication Sig Dispense Refill   AIMOVIG 140 MG/ML SOAJ Inject into the skin.     albuterol (VENTOLIN HFA) 108 (90 Base) MCG/ACT inhaler Inhale 2 puffs into the lungs every 4 (four) hours as needed for wheezing. 1 each 0   aspirin 81 MG EC tablet Take 81 mg by mouth daily. Swallow whole.      Calcium-Magnesium-Vitamin D (CALCIUM 1200+D3 PO) Take 1 tablet by mouth daily. D3 25 mcg     cholecalciferol (VITAMIN D) 1000 units tablet Take 2,000 Units by mouth in the morning and at bedtime.      clopidogrel (PLAVIX) 75 MG tablet Take 1 tablet (75 mg total) by mouth daily. 30 tablet 0   cyanocobalamin (VITAMIN B12) 500 MCG tablet Take 500 mcg by mouth daily.     famotidine (PEPCID) 20 MG tablet Take 1 tablet (20 mg total) by mouth daily. 30 tablet 0   fluticasone (FLONASE) 50 MCG/ACT nasal spray Place 1 spray into both nostrils daily as needed for allergies.     furosemide (LASIX) 40 MG tablet Take 1 tablet (40 mg total) by mouth daily. 30 tablet 1   glucagon 1 MG injection Inject 1 mg into the vein once as needed. Reported on 02/11/2016     GLUTOSE 15 40 % GEL 1 Tube once as needed for low blood sugar.     insulin aspart (NOVOLOG) 100 UNIT/ML injection Inject 20 Units into the skin 3 (three) times daily. (Patient taking differently: Inject 8-20 Units into the skin 3 (three) times daily with meals. Sliding Scale) 10 mL 5   insulin glargine-yfgn (SEMGLEE) 100 UNIT/ML injection 20 Units at bedtime.     isosorbide mononitrate (IMDUR) 60 MG 24 hr tablet Take 60 mg by mouth every evening.     loratadine (CLARITIN) 10 MG tablet Take 10 mg by mouth daily.     magnesium oxide (MAG-OX) 400 MG tablet Take 400 mg by mouth daily.     metoprolol tartrate (LOPRESSOR) 25 MG tablet Take 25 mg by mouth 2 (two) times daily.     nitroGLYCERIN (NITROSTAT) 0.4 MG SL tablet DISSOLVE 1 TABLET UNDER THE TONGUE EVERY 5 MINUTES AS NEEDED FOR CHEST PAIN 30 tablet 0   potassium chloride SA (K-DUR,KLOR-CON) 20 MEQ tablet Take 1 tablet (20 mEq total) by mouth daily. 30 tablet 1   raNITIdine HCl (ACID REDUCER PO) Take 1 tablet by  mouth daily.     rosuvastatin (CRESTOR) 20 MG tablet Take 1 tablet (20 mg total) by mouth daily at 6 PM. 30 tablet 1   Travoprost, BAK Free, (TRAVATAN) 0.004 % SOLN ophthalmic solution Place 1  drop into both eyes at bedtime.      triamcinolone ointment (KENALOG) 0.1 % Apply 1 application. topically daily as needed (rash).     vitamin C (ASCORBIC ACID) 500 MG tablet Take 500 mg by mouth 2 (two) times daily.      No current facility-administered medications on file prior to visit.    Review of Systems  Constitutional:  Positive for appetite change and fatigue. Negative for fever.  HENT:  Positive for congestion, postnasal drip, rhinorrhea, sinus pressure, sinus pain, sneezing and sore throat. Negative for ear pain and trouble swallowing.   Eyes:  Negative for pain and discharge.  Respiratory:  Positive for cough and wheezing. Negative for shortness of breath and stridor.   Cardiovascular:  Negative for chest pain.  Gastrointestinal:  Negative for diarrhea, nausea and vomiting.  Genitourinary:  Negative for frequency, hematuria and urgency.  Musculoskeletal:  Negative for arthralgias and myalgias.  Skin:  Negative for rash.  Neurological:  Positive for headaches. Negative for dizziness, weakness and light-headedness.  Psychiatric/Behavioral:  Negative for confusion and dysphoric mood.        Objective:   Physical Exam Constitutional:      General: He is not in acute distress.    Appearance: Normal appearance. He is well-developed. He is obese. He is not ill-appearing, toxic-appearing or diaphoretic.  HENT:     Head: Normocephalic and atraumatic.     Comments: Nares are injected and congested    Tender frontal and maxillary sinus tenderness bilat    Right Ear: Tympanic membrane, ear canal and external ear normal.     Left Ear: Tympanic membrane, ear canal and external ear normal.     Nose: Congestion and rhinorrhea present.     Mouth/Throat:     Mouth: Mucous membranes are moist.     Pharynx: Oropharynx is clear. No oropharyngeal exudate or posterior oropharyngeal erythema.     Comments: Clear pnd  Eyes:     General:        Right eye: No discharge.        Left eye: No  discharge.     Conjunctiva/sclera: Conjunctivae normal.     Pupils: Pupils are equal, round, and reactive to light.  Cardiovascular:     Rate and Rhythm: Normal rate.     Heart sounds: Normal heart sounds.  Pulmonary:     Effort: Pulmonary effort is normal. No respiratory distress.     Breath sounds: Normal breath sounds. No stridor. No wheezing, rhonchi or rales.     Comments: Some upper airway sounds   No wheezing  Chest:     Chest wall: No tenderness.  Musculoskeletal:     Cervical back: Normal range of motion and neck supple.  Lymphadenopathy:     Cervical: No cervical adenopathy.  Skin:    General: Skin is warm and dry.     Capillary Refill: Capillary refill takes less than 2 seconds.     Findings: No rash.  Neurological:     Mental Status: He is alert.     Cranial Nerves: No cranial nerve deficit.  Psychiatric:        Mood and Affect: Mood normal.           Assessment & Plan:  Problem List Items Addressed This Visit       Other   COVID-19 - Primary    Pt presents for re check today  He did have some rebound in symptoms after finishing paxlovid this weekend  Reassuring exam Some sinus tenderness Some chest congestion  Cxr clear today  Will tx for bacterial sinusitis Update if not starting to improve in a week or if worsening  Symptom care discussed       Relevant Orders   DG Chest 2 View (Completed)

## 2022-12-22 NOTE — Patient Instructions (Addendum)
Chest xay now  We will call with result and plan  Keep treating your symptoms  Drink fluids and rest  If wheezing worsens let us know  (use your albuterol inhaler as needed)    Go to Er if you suddenly get severe or have trouble breathing

## 2022-12-30 ENCOUNTER — Encounter: Payer: Self-pay | Admitting: Podiatry

## 2022-12-30 ENCOUNTER — Ambulatory Visit (INDEPENDENT_AMBULATORY_CARE_PROVIDER_SITE_OTHER): Payer: Medicare Other

## 2022-12-30 ENCOUNTER — Ambulatory Visit (INDEPENDENT_AMBULATORY_CARE_PROVIDER_SITE_OTHER): Payer: Medicare Other | Admitting: Podiatry

## 2022-12-30 VITALS — BP 163/64 | HR 58

## 2022-12-30 DIAGNOSIS — E0843 Diabetes mellitus due to underlying condition with diabetic autonomic (poly)neuropathy: Secondary | ICD-10-CM

## 2022-12-30 DIAGNOSIS — Z9889 Other specified postprocedural states: Secondary | ICD-10-CM

## 2022-12-30 DIAGNOSIS — R52 Pain, unspecified: Secondary | ICD-10-CM

## 2022-12-30 DIAGNOSIS — L97522 Non-pressure chronic ulcer of other part of left foot with fat layer exposed: Secondary | ICD-10-CM

## 2022-12-30 DIAGNOSIS — L97512 Non-pressure chronic ulcer of other part of right foot with fat layer exposed: Secondary | ICD-10-CM

## 2023-01-04 NOTE — Progress Notes (Signed)
Chief Complaint  Patient presents with   Routine Post Op    Wife stated, "It's draining on the right foot.  On the left foot, it's is looking iffy."    Subjective:  Patient presents today status post partial toe amputation of the right second digit.  DOS: 10/28/2022.  Patient states that he was doing very well however he noticed that the bilateral remaining toes are developing wounds to the distal tips.  These are new findings today.  He presents for further treatment and evaluation  Past Medical History:  Diagnosis Date   Allergies    Bronchitis    PMH   CAD (coronary artery disease)    07/2007   Diabetes ([place])    Elevated lipids    Glaucoma    HBP (high blood pressure)    Headache    Myocardial infarction (HCC)    Neuropathy    Osteoarthritis    PAD (peripheral artery disease) ([place])    a. 03/2022 Lower Ext Angio: No signif Ao-iliac dzs. R PT diff dzs and occluded above ankle. R Pedal arch intact w/ excellent antegrade flow provided by R AT-->no indication for revasc.   Skin cancer    Skin ulcer of right great toe, unspecified ulcer stage ([place])    Sleep apnea    CPAP   Wears glasses    Wears partial dentures     Past Surgical History:  Procedure Laterality Date   ABDOMINAL AORTOGRAM W/LOWER EXTREMITY N/A 03/26/2022   Procedure: ABDOMINAL AORTOGRAM W/LOWER EXTREMITY;  Surgeon: Wellington Hampshire, MD;  Location: [facility];  Service: Cardiovascular;  Laterality: N/A;   APPENDECTOMY     CARDIAC CATHETERIZATION N/A 06/11/2016   Procedure: Left Heart Cath and Coronary Angiography;  Surgeon: Corey Skains, MD;  Location: [facility];  Service: Cardiovascular;  Laterality: N/A;   CARPAL TUNNEL RELEASE Left 04/24/2016   Procedure: CARPAL TUNNEL RELEASE;  Surgeon: Hessie Knows, MD;  Location: [facility];  Service: Orthopedics;  Laterality: Left;   CATARACT EXTRACTION W/ INTRAOCULAR LENS  IMPLANT, BILATERAL Bilateral    CATARACT EXTRACTION, BILATERAL     R eye  07/16/12, L eye 08/13/12 - with lens implant in both eyes   CORONARY ARTERY BYPASS GRAFT N/A 06/27/2016   Procedure: CORONARY ARTERY BYPASS GRAFTING times four using left internal mammary artery and right leg saphenous vein;  Surgeon: Ivin Poot, MD;  Location: [facility];  Service: Open Heart Surgery;  Laterality: N/A;   EYE SURGERY Bilateral    JOINT REPLACEMENT     KNEE ARTHROPLASTY Left 02/11/2016   Procedure: COMPUTER ASSISTED TOTAL KNEE ARTHROPLASTY;  Surgeon: Dereck Leep, MD;  Location: [facility];  Service: Orthopedics;  Laterality: Left;   KNEE ARTHROSCOPY Right    LEFT HEART CATH AND CORS/GRAFTS ANGIOGRAPHY Left 01/13/2017   Procedure: Left Heart Cath and Cors/Grafts Angiography;  Surgeon: Corey Skains, MD;  Location: [facility];  Service: Cardiovascular;  Laterality: Left;   METATARSAL HEAD EXCISION Right 03/21/2020   Procedure: METATARSAL HEAD RESECTION RIGHT AND DEBRIDEMENT OF ULCER;  Surgeon: [name], DPM;  Location: [facility];  Service: Podiatry;  Laterality: Right;   MULTIPLE TOOTH EXTRACTIONS     TEE WITHOUT CARDIOVERSION N/A 06/27/2016   Procedure: TRANSESOPHAGEAL ECHOCARDIOGRAM (TEE);  Surgeon: Ivin Poot, MD;  Location: [facility];  Service: Open Heart Surgery;  Laterality: N/A;    Allergies  Allergen Reactions   Nitroglycerin Nausea Only and Other (See Comments)  Patches only - headache    Statins Other (See Comments)    BODY PAIN, Pt taking Crestor*      Fluvastatin Other (See Comments)    Other reaction(s): Muscle pain   Fluvastatin Sodium     Other reaction(s): Myalgias   Rosuvastatin Other (See Comments)    Other reaction(s): Muscle pain, Myalgias   Simvastatin Other (See Comments)    Other reaction(s): Muscle pain, Myalgias   Ezetimibe Other (See Comments)    Other reaction(s): Muscle Pain   Lipitor [Atorvastatin] Other (See Comments) and Rash    BODY PAIN   Lisinopril Other (See Comments) and Nausea Only    Light headed, dizzy Other  reaction(s): Dizziness    LT foot 12/30/2022  RT foot 12/30/2022 Objective/Physical Exam Vascular status intact.  Ulcers noted to the distal tip of the second digit right foot as well as the third digit of the left foot.  Chronic lower extremity edema noted.  Light touch and protective threshold absent.  Wounds measure approximately 1.0 x 1.0 x 0.2 cm.  Granular wound base.  No exposed bone muscle tendon ligament or joint at the moment.  Periwound intact.  Radiographic Exam RT foot 12/30/2022:  Absence of the distal middle phalanx of the right second toe noted.  Prior amputations of the great toe also.  No osseous erosions or cortical irregularities that would be concerning for osteomyelitis are noted to the second digit of the right foot and third digit of the left specifically  Assessment: 1. s/p partial toe amputation right second. DOS: 10/28/2022 -Patient evaluated.   -Patient is adamant that the crocs that he wears do not irritate the distal tips of the toes but I do believe these wounds are developing from an appropriate shoe gear.  Postsurgical shoes dispensed bilateral.  WBAT -Recommend Betadine and a light dressing daily -Medically necessary excisional debridement including subcutaneous tissue was performed today using a tissue nipper.  Excisional debridement of the necrotic nonviable tissue down to healthier bleeding viable tissue was performed postdebridement measurement same as pre- -Return to clinic 2 weeks  [name], DPM [facility]  Dr. [name], DPM    [address],  [address]                Office [phone]  Fax [phone]

## 2023-01-13 ENCOUNTER — Ambulatory Visit (INDEPENDENT_AMBULATORY_CARE_PROVIDER_SITE_OTHER): Payer: Medicare Other | Admitting: Podiatry

## 2023-01-13 DIAGNOSIS — L97512 Non-pressure chronic ulcer of other part of right foot with fat layer exposed: Secondary | ICD-10-CM

## 2023-01-13 DIAGNOSIS — E0843 Diabetes mellitus due to underlying condition with diabetic autonomic (poly)neuropathy: Secondary | ICD-10-CM | POA: Diagnosis not present

## 2023-01-13 NOTE — Progress Notes (Signed)
No chief complaint on file.   Subjective:  Patient presents today status post partial toe amputation of the right second digit.  DOS: 10/28/2022.  Patient states that he was doing very well however he noticed that the bilateral remaining toes are developing wounds to the distal tips.  These are new findings today.  He presents for further treatment and evaluation  Past Medical History:  Diagnosis Date   Allergies    Bronchitis    PMH   CAD (coronary artery disease)    07/2007   Diabetes ([place])    Elevated lipids    Glaucoma    HBP (high blood pressure)    Headache    Myocardial infarction (HCC)    Neuropathy    Osteoarthritis    PAD (peripheral artery disease) ([place])    a. 03/2022 Lower Ext Angio: No signif Ao-iliac dzs. R PT diff dzs and occluded above ankle. R Pedal arch intact w/ excellent antegrade flow provided by R AT-->no indication for revasc.   Skin cancer    Skin ulcer of right great toe, unspecified ulcer stage ([place])    Sleep apnea    CPAP   Wears glasses    Wears partial dentures     Past Surgical History:  Procedure Laterality Date   ABDOMINAL AORTOGRAM W/LOWER EXTREMITY N/A 03/26/2022   Procedure: ABDOMINAL AORTOGRAM W/LOWER EXTREMITY;  Surgeon: Wellington Hampshire, MD;  Location: [facility];  Service: Cardiovascular;  Laterality: N/A;   APPENDECTOMY     CARDIAC CATHETERIZATION N/A 06/11/2016   Procedure: Left Heart Cath and Coronary Angiography;  Surgeon: Corey Skains, MD;  Location: [facility];  Service: Cardiovascular;  Laterality: N/A;   CARPAL TUNNEL RELEASE Left 04/24/2016   Procedure: CARPAL TUNNEL RELEASE;  Surgeon: Hessie Knows, MD;  Location: [facility];  Service: Orthopedics;  Laterality: Left;   CATARACT EXTRACTION W/ INTRAOCULAR LENS  IMPLANT, BILATERAL Bilateral    CATARACT EXTRACTION, BILATERAL     R eye 07/16/12, L eye 08/13/12 - with lens implant in both eyes   CORONARY ARTERY BYPASS GRAFT N/A 06/27/2016   Procedure: CORONARY  ARTERY BYPASS GRAFTING times four using left internal mammary artery and right leg saphenous vein;  Surgeon: Ivin Poot, MD;  Location: [facility];  Service: Open Heart Surgery;  Laterality: N/A;   EYE SURGERY Bilateral    JOINT REPLACEMENT     KNEE ARTHROPLASTY Left 02/11/2016   Procedure: COMPUTER ASSISTED TOTAL KNEE ARTHROPLASTY;  Surgeon: Dereck Leep, MD;  Location: [facility];  Service: Orthopedics;  Laterality: Left;   KNEE ARTHROSCOPY Right    LEFT HEART CATH AND CORS/GRAFTS ANGIOGRAPHY Left 01/13/2017   Procedure: Left Heart Cath and Cors/Grafts Angiography;  Surgeon: Corey Skains, MD;  Location: [facility];  Service: Cardiovascular;  Laterality: Left;   METATARSAL HEAD EXCISION Right 03/21/2020   Procedure: METATARSAL HEAD RESECTION RIGHT AND DEBRIDEMENT OF ULCER;  Surgeon: [name], DPM;  Location: [facility];  Service: Podiatry;  Laterality: Right;   MULTIPLE TOOTH EXTRACTIONS     TEE WITHOUT CARDIOVERSION N/A 06/27/2016   Procedure: TRANSESOPHAGEAL ECHOCARDIOGRAM (TEE);  Surgeon: Ivin Poot, MD;  Location: [facility];  Service: Open Heart Surgery;  Laterality: N/A;    Allergies  Allergen Reactions   Nitroglycerin Nausea Only and Other (See Comments)    Patches only - headache    Statins Other (See Comments)    BODY PAIN, Pt taking Crestor*      Fluvastatin  Other (See Comments)    Other reaction(s): Muscle pain   Fluvastatin Sodium     Other reaction(s): Myalgias   Rosuvastatin Other (See Comments)    Other reaction(s): Muscle pain, Myalgias   Simvastatin Other (See Comments)    Other reaction(s): Muscle pain, Myalgias   Ezetimibe Other (See Comments)    Other reaction(s): Muscle Pain   Lipitor [Atorvastatin] Other (See Comments) and Rash    BODY PAIN   Lisinopril Other (See Comments) and Nausea Only    Light headed, dizzy Other reaction(s): Dizziness    LT foot 12/30/2022  RT foot 12/30/2022   LT foot 01/13/2023  RT foot  01/13/2023   Objective/Physical Exam Vascular status intact.  Ulcers noted to the distal tip of the second and third digit right foot as well as the third digit of the left foot.  Chronic lower extremity edema noted.  Light touch and protective threshold absent.  Wounds measure approximately 1.0 x 1.0 x 0.2 cm.  Granular wound base.  No exposed bone muscle tendon ligament or joint at the moment with the exception of possible exposed bone to the distal portion of the second digit of the right foot.  Currently no indication of cellulitis or acute infection  Radiographic Exam RT foot 12/30/2022:  Absence of the distal middle phalanx of the right second toe noted.  Prior amputations of the great toe also.  No osseous erosions or cortical irregularities that would be concerning for osteomyelitis are noted to the second digit of the right foot and third digit of the left specifically  Assessment: 1. s/p partial toe amputation right second. DOS: 10/28/2022 2.  Ulcers lesser digits bilateral  -Patient evaluated.   -Continue open toed postsurgical shoes -Medically necessary excisional debridement including subcutaneous tissue was performed today using a tissue nipper.  Excisional debridement of the necrotic nonviable tissue down to healthier bleeding viable tissue was performed with postdebridement measurement same as pre- -Referral placed to send the patient to the [facility] wound care center.  I believe their assessment and treatment with optimizing patient's potential for healing.  In the meantime continue gentamicin cream and a light dressing -Return to clinic 6 weeks   [name], DPM [facility]  Dr. [name], DPM    [address], [place] [address]                Office [phone]  Fax [phone]

## 2023-01-30 ENCOUNTER — Ambulatory Visit
Admission: RE | Admit: 2023-01-30 | Discharge: 2023-01-30 | Disposition: A | Discharge status: Home / Self Care | Payer: Medicare Other | Source: Ambulatory Visit | Attending: Physician Assistant | Admitting: Physician Assistant

## 2023-01-30 ENCOUNTER — Ambulatory Visit
Admission: RE | Admit: 2023-01-30 | Discharge: 2023-01-30 | Disposition: A | Discharge status: Home / Self Care | Payer: Medicare Other | Attending: Physician Assistant | Admitting: Physician Assistant

## 2023-01-30 ENCOUNTER — Other Ambulatory Visit: Payer: Self-pay | Admitting: Physician Assistant

## 2023-01-30 ENCOUNTER — Encounter: Payer: Medicare Other | Attending: Physician Assistant | Admitting: Physician Assistant

## 2023-01-30 DIAGNOSIS — E10621 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: Secondary | ICD-10-CM | POA: Diagnosis not present

## 2023-01-30 DIAGNOSIS — Z89411 Acquired absence of right great toe: Secondary | ICD-10-CM | POA: Insufficient documentation

## 2023-01-30 DIAGNOSIS — E1051 Type 1 diabetes mellitus with diabetic peripheral angiopathy without gangrene: Secondary | ICD-10-CM | POA: Insufficient documentation

## 2023-01-30 DIAGNOSIS — S91302A Unspecified open wound, left foot, initial encounter: Secondary | ICD-10-CM | POA: Diagnosis present

## 2023-01-30 DIAGNOSIS — N18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, stage 3a: Secondary | ICD-10-CM | POA: Insufficient documentation

## 2023-01-30 DIAGNOSIS — I2510 Atherosclerotic heart disease of native coronary artery without angina pectoris: Secondary | ICD-10-CM | POA: Diagnosis not present

## 2023-01-30 DIAGNOSIS — L97522 Non-pressure chronic ulcer of other part of left foot with fat layer exposed: Secondary | ICD-10-CM | POA: Insufficient documentation

## 2023-01-30 DIAGNOSIS — E1022 Type 1 diabetes mellitus with diabetic chronic kidney disease: Secondary | ICD-10-CM | POA: Insufficient documentation

## 2023-01-30 DIAGNOSIS — Z89421 Acquired absence of other right toe(s): Secondary | ICD-10-CM | POA: Diagnosis not present

## 2023-01-30 DIAGNOSIS — L97512 Non-pressure chronic ulcer of other part of right foot with fat layer exposed: Secondary | ICD-10-CM | POA: Insufficient documentation

## 2023-01-30 DIAGNOSIS — I129 Hypertensive chronic kidney disease with stage 1 through stage 4 chronic kidney disease, or unspecified chronic kidney disease: Secondary | ICD-10-CM | POA: Insufficient documentation

## 2023-01-30 NOTE — Progress Notes (Signed)
Lawrence [name] ([id number]) [id number] Page 1 of 10 Visit Report for 01/30/2023 Allergy List Details Patient Name: Date of Service: [name], [name] 01/30/2023 11:30 A M Medical Record Number: [id number] Patient Account Number: [id number] Date of Birth/Sex: Treating RN: [birth year] (72 y.o. Lawrence [name]) Carlene Coria Primary Care Lanett Lasorsa: Alma Friendly Other Clinician: Referring Cartha Rotert: Treating Reggie Bise/Extender: Griselda Miner Weeks in Treatment: 0 Allergies Active Allergies fluvastatin nitroglycerin rosuvastatin simvastatin ezetimibe Lipitor lisinopril Allergy Notes Electronic Signature(s) Signed: 01/30/2023 1:09:18 PM By: Carlene Coria RN Entered By: Carlene Coria on 01/30/2023 11:36:25 -------------------------------------------------------------------------------- Arrival Information Details Patient Name: Date of Service: Lawrence Schultze MS, [name] 01/30/2023 11:30 A M Medical Record Number: [id number] Patient Account Number: [id number] Date of Birth/Sex: Treating RN: [birth year] (72 y.o. Lawrence [name]) Carlene Coria Primary Care Anais Denslow: Alma Friendly Other Clinician: Referring Rochanda Harpham: Treating Aayana Reinertsen/Extender: Primitivo Gauze in Treatment: 0 Visit Information Patient Arrived: Lyndel Pleasure Time: 11:29 Accompanied By: wife Transfer Assistance: None Patient Identification Verified: Yes Secondary Verification Process Completed: Yes Patient Requires Transmission-Based Precautions: No Patient Has Alerts: Yes Patient Alerts: Patient on Blood Thinner DIABETIC TYPE 1 ABI L [place] TBI .68 10/07/22 ABI R [place] 10/07/22 Electronic Signature(s) Signed: 01/30/2023 12:43:41 PM By: Carlene Coria RN Previous Signature: 01/30/2023 12:40:12 PM Version By: Carlene Coria RN Entered By: Carlene Coria on 01/30/2023 12:43:41 Clinic Level of Care Assessment Details -------------------------------------------------------------------------------- Lawrence [name] ([id number]) [id number] Page 2 of 10 Patient Name: Date of Service: [name], [name] 01/30/2023 11:30 A M Medical Record Number: [id number] Patient Account Number: [id number] Date of Birth/Sex: Treating RN: 02-14-1951 (72 y.o. Lawrence [name]) Carlene Coria Primary Care Mearl Harewood: Alma Friendly Other Clinician: Referring Kye Hedden: Treating Kasheena Sambrano/Extender: Griselda Miner Weeks in Treatment: 0 Clinic Level of Care Assessment Items TOOL 1 Quantity Score X- 1 0 Use when EandM and Procedure is performed on INITIAL visit ASSESSMENTS - Nursing Assessment / Reassessment X- 1 20 General Physical Exam (combine w/ comprehensive assessment (listed just below) when performed on new pt. evals) X- 1 25 Comprehensive Assessment (HX, ROS, Risk Assessments, Wounds Hx, etc.) ASSESSMENTS - Wound and Skin Assessment / Reassessment []  - 0 Dermatologic / Skin Assessment (not related to wound area) ASSESSMENTS - Ostomy and/or Continence Assessment and Care []  - 0 Incontinence Assessment and Management []  - 0 Ostomy Care Assessment and Management (repouching, etc.) PROCESS - Coordination of Care X - Simple Patient / Family Education for ongoing care 1 15 []  - 0 Complex (extensive) Patient / Family Education for ongoing care X- 1 10 Staff obtains Programmer, systems, Records, T Results / Process Orders est []  - 0 Staff telephones HHA, Nursing Homes / Clarify orders / etc []  - 0 Routine Transfer to another Facility (non-emergent condition) []  - 0 Routine Hospital Admission (non-emergent condition) X- 1 15 New Admissions / Biomedical engineer / Ordering NPWT Apligraf, etc. , []  - 0 Emergency Hospital Admission (emergent condition) PROCESS - Special Needs []  - 0 Pediatric / Minor Patient Management []  - 0 Isolation Patient Management []  - 0 Hearing / Language / Visual special needs []  - 0 Assessment of Community assistance (transportation, D/C planning, etc.) []  -  0 Additional assistance / Altered mentation []  - 0 Support Surface(s) Assessment (bed, cushion, seat, etc.) INTERVENTIONS - Miscellaneous []  - 0 External ear exam []  - 0 Patient Transfer (multiple staff / Civil Service fast streamer / Similar devices) []  - 0 Simple Staple / Suture removal (25 or less) []  - 0  Complex Staple / Suture removal (26 or more) []  - 0 Hypo/Hyperglycemic Management (do not check if billed separately) []  - 0 Ankle / Brachial Index (ABI) - do not check if billed separately Has the patient been seen at the hospital within the last three years: Yes Total Score: 85 Level Of Care: New/Established - Level 3 Electronic Signature(s) Signed: 01/30/2023 1:09:18 PM By: Carlene Coria RN Entered By: Carlene Coria on 01/30/2023 12:48:22 Lower Extremity Assessment Details -------------------------------------------------------------------------------- Lawrence [name] ([id number]) [phone].pdf Page 3 of 10 Patient Name: Date of Service: [name], [name] 01/30/2023 11:30 A M Medical Record Number: [id number] Patient Account Number: [id number] Date of Birth/Sex: Treating RN: May 31, 1951 (72 y.o. Lawrence [name]) Carlene Coria Primary Care Kodi Steil: Alma Friendly Other Clinician: Referring Tinisha Etzkorn: Treating Harvin Konicek/Extender: Griselda Miner Weeks in Treatment: 0 Vascular Assessment Left: Right: Pulses: Dorsalis Pedis Palpable: Yes Yes Electronic Signature(s) Signed: 01/30/2023 1:09:18 PM By: Carlene Coria RN Entered By: Carlene Coria on 01/30/2023 11:57:22 -------------------------------------------------------------------------------- Multi Wound Chart Details Patient Name: Date of Service: [name], [name] 01/30/2023 11:30 A M Medical Record Number: [id number] Patient Account Number: [id number] Date of Birth/Sex: Treating RN: 06-Nov-1950 (72 y.o. Lawrence [name]) Carlene Coria Primary Care Lycan Davee: Alma Friendly Other Clinician: Referring Kaarin Pardy: Treating  Evaluna Utke/Extender: Griselda Miner Weeks in Treatment: 0 Vital Signs Height(in): Pulse(bpm): 60 Weight(lbs): Blood Pressure(mmHg): 165/100 Body Mass Index(BMI): Temperature(F): 97.7 Respiratory Rate(breaths/min): 18 [1:Photos:] Left T Second oe Left T Third oe Right T Great oe Wound Location: Gradually Appeared Gradually Appeared Gradually Appeared Wounding Event: Diabetic Wound/Ulcer of the Lower Diabetic Wound/Ulcer of the Lower Diabetic Wound/Ulcer of the Lower Primary Etiology: Extremity Extremity Extremity Glaucoma, Coronary Artery Disease, Glaucoma, Coronary Artery Disease, Glaucoma, Coronary Artery Disease, Comorbid History: Hypertension, Myocardial Infarction, Hypertension, Myocardial Infarction, Hypertension, Myocardial Infarction, Type II Diabetes, End Stage Renal Type II Diabetes, End Stage Renal Type II Diabetes, End Stage Renal Disease Disease Disease 11/03/2022 11/03/2022 12/04/2022 Date Acquired: 0 0 0 Weeks of Treatment: Open Open Open Wound Status: No No No Wound Recurrence: Yes Yes Yes Pending A mputation on Presentation: 1x1x0.3 0.6x1x0.1 0.5x0.4x0.1 Measurements L x W x D (cm) 0.785 0.471 0.157 A (cm) : rea 0.236 0.047 0.016 Volume (cm) : Grade 2 Grade 2 Grade 2 Classification: Medium Medium Medium Exudate A mount: Serosanguineous Serosanguineous Serosanguineous Exudate Type: red, brown red, brown red, brown Exudate Color: N/A None Present (0%) Large (67-100%) Granulation A mount: N/A N/A Red, Pink Granulation Quality: N/A Large (67-100%) N/A Necrotic A mount: Adherent Slough Adherent Slough N/A Necrotic Tissue: Fat Layer (Subcutaneous Tissue): Yes Fat Layer (Subcutaneous Tissue): Yes Fat Layer (Subcutaneous Tissue): Yes Exposed Structures: Bone: Yes Fascia: No Fascia: No Tendon: No Tendon: No Muscle: No Muscle: No Joint: No Joint: No MARKO, MERMELSTEIN ([id number]) [id number] Page 4 of  10 Bone: No Bone: No None None None Epithelialization: Wound Number: 4 N/A N/A Photos: N/A N/A Right T Third oe N/A N/A Wound Location: Gradually Appeared N/A N/A Wounding Event: Diabetic Wound/Ulcer of the Lower N/A N/A Primary Etiology: Extremity Glaucoma, Coronary Artery Disease, N/A N/A Comorbid History: Hypertension, Myocardial Infarction, Type II Diabetes, End Stage Renal Disease 12/05/2022 N/A N/A Date Acquired: 0 N/A N/A Weeks of Treatment: Open N/A N/A Wound Status: No N/A N/A Wound Recurrence: Yes N/A N/A Pending A mputation on Presentation: 1.2x2x0.2 N/A N/A Measurements L x W x D (cm) 1.885 N/A N/A A (cm) : rea 0.377 N/A N/A Volume (cm) : Grade 2 N/A N/A  Classification: Medium N/A N/A Exudate A mount: Serosanguineous N/A N/A Exudate Type: red, brown N/A N/A Exudate Color: None Present (0%) N/A N/A Granulation A mount: N/A N/A N/A Granulation Quality: Large (67-100%) N/A N/A Necrotic A mount: Eschar, Adherent Slough N/A N/A Necrotic Tissue: Fat Layer (Subcutaneous Tissue): Yes N/A N/A Exposed Structures: Fascia: No Tendon: No Muscle: No Joint: No Bone: No None N/A N/A Epithelialization: Treatment Notes Electronic Signature(s) Signed: 01/30/2023 1:09:18 PM By: Carlene Coria RN Entered By: Carlene Coria on 01/30/2023 11:57:30 -------------------------------------------------------------------------------- Multi-Disciplinary Care Plan Details Patient Name: Date of Service: Lawrence Schultze MS, [name] 01/30/2023 11:30 A M Medical Record Number: [id number] Patient Account Number: [id number] Date of Birth/Sex: Treating RN: [birth year] (72 y.o. Oval Linsey Primary Care Tationna Fullard: Alma Friendly Other Clinician: Referring Alyissa Whidbee: Treating Brindle Leyba/Extender: Griselda Miner Weeks in Treatment: 0 Active Inactive Necrotic Tissue Nursing Diagnoses: Knowledge deficit related to management of necrotic/devitalized  tissue Goals: Necrotic/devitalized tissue will be minimized in the wound bed Date Initiated: 01/30/2023 Target Resolution Date: 03/02/2023 Goal Status: Active WILLS, ELMENDORF ([id number]) [phone].pdf Page 5 of 10 Interventions: Assess patient pain level pre-, during and post procedure and prior to discharge Notes: Nutrition Nursing Diagnoses: Potential for alteratiion in Nutrition/Potential for imbalanced nutrition Goals: Patient/caregiver verbalizes understanding of need to maintain therapeutic glucose control per primary care physician Date Initiated: 01/30/2023 Target Resolution Date: 03/02/2023 Goal Status: Active Interventions: Assess HgA1c results as ordered upon admission and as needed Assess patient nutrition upon admission and as needed per policy Notes: Wound/Skin Impairment Nursing Diagnoses: Knowledge deficit related to ulceration/compromised skin integrity Goals: Patient/caregiver will verbalize understanding of skin care regimen Date Initiated: 01/30/2023 Target Resolution Date: 03/02/2023 Goal Status: Active Ulcer/skin breakdown will have a volume reduction of 30% by week 4 Date Initiated: 01/30/2023 Target Resolution Date: 03/02/2023 Goal Status: Active Ulcer/skin breakdown will have a volume reduction of 50% by week 8 Date Initiated: 01/30/2023 Target Resolution Date: 04/01/2023 Goal Status: Active Ulcer/skin breakdown will have a volume reduction of 80% by week 12 Date Initiated: 01/30/2023 Target Resolution Date: 05/02/2023 Goal Status: Active Ulcer/skin breakdown will heal within 14 weeks Date Initiated: 01/30/2023 Target Resolution Date: 06/01/2023 Goal Status: Active Interventions: Assess patient/caregiver ability to obtain necessary supplies Assess patient/caregiver ability to perform ulcer/skin care regimen upon admission and as needed Assess ulceration(s) every visit Notes: Electronic Signature(s) Signed: 01/30/2023 1:09:18 PM  By: Carlene Coria RN Entered By: Carlene Coria on 01/30/2023 11:59:02 -------------------------------------------------------------------------------- Pain Assessment Details Patient Name: Date of Service: Lawrence Schultze MS, [name] 01/30/2023 11:30 A M Medical Record Number: [id number] Patient Account Number: [id number] Date of Birth/Sex: Treating RN: 08-Aug-1951 (72 y.o. Oval Linsey Primary Care Victorious Cosio: Alma Friendly Other Clinician: Referring Monroe Qin: Treating Michie Molnar/Extender: Griselda Miner Weeks in Treatment: 0 Active Problems Location of Pain Severity and Description of Pain Patient Has Paino No Site Locations JOHANNA, BOLYARD ([id number]) [phone].pdf Page 6 of 10 Pain Management and Medication Current Pain Management: Electronic Signature(s) Signed: 01/30/2023 1:09:18 PM By: Carlene Coria RN Entered By: Carlene Coria on 01/30/2023 11:29:54 -------------------------------------------------------------------------------- Patient/Caregiver Education Details Patient Name: Date of Service: Lawrence Schultze MS, [name] 3/29/2024andnbsp11:30 A M Medical Record Number: [id number] Patient Account Number: [id number] Date of Birth/Gender: Treating RN: [birth year] (72 y.o. Lawrence [name]) Carlene Coria Primary Care Physician: Alma Friendly Other Clinician: Referring Physician: Treating Physician/Extender: Primitivo Gauze in Treatment: 0 Education Assessment Education Provided To: Patient Education Topics Provided Welcome [facility]  Packet: o Handouts: Welcome T [facility] o Methods: Explain/Verbal Responses: State content correctly Electronic Signature(s) Signed: 01/30/2023 1:09:18 PM By: Carlene Coria RN Entered By: Carlene Coria on 01/30/2023 11:59:24 -------------------------------------------------------------------------------- Wound Assessment Details Patient Name: Date of Service: [name], [name]  01/30/2023 11:30 A M Medical Record Number: [id number] Patient Account Number: [id number] Date of Birth/Sex: Treating RN: 1950-12-13 (72 y.o. Lawrence [name]) Carlene Coria Primary Care Tarus Briski: Alma Friendly Other Clinician: Referring Nicholus Chandran: Treating Kasra Melvin/Extender: Griselda Miner Weeks in Treatment: 0 Wound Status Wound Number: 1 Primary Diabetic Wound/Ulcer of the Lower Extremity Etiology: Wound Location: Left T Second oe Wound Open Wounding Event: Gradually Appeared Status: Date Acquired: 11/03/2022 Comorbid Glaucoma, Coronary Artery Disease, Hypertension, Myocardial Weeks Of Treatment: 0 MIROSLAV, GANCI ([id number]) [id number] Page 7 of 10 Weeks Of Treatment: 0 History: Infarction, Type II Diabetes, End Stage Renal Disease Clustered Wound: No Photos Wound Measurements Length: (cm) 1 Width: (cm) 1 Depth: (cm) 0.3 Area: (cm) 0.785 Volume: (cm) 0.236 % Reduction in Area: % Reduction in Volume: Epithelialization: None Tunneling: No Undermining: No Wound Description Classification: Grade 2 Exudate Amount: Medium Exudate Type: Serosanguineous Exudate Color: red, brown Foul Odor After Cleansing: No Slough/Fibrino Yes Wound Bed Necrotic Amount: Large (67-100%) Exposed Structure Necrotic Quality: Adherent Slough Fat Layer (Subcutaneous Tissue) Exposed: Yes Bone Exposed: Yes Electronic Signature(s) Signed: 01/30/2023 1:09:18 PM By: Carlene Coria RN Entered By: Carlene Coria on 01/30/2023 11:52:21 -------------------------------------------------------------------------------- Wound Assessment Details Patient Name: Date of Service: [name], [name] 01/30/2023 11:30 A M Medical Record Number: [id number] Patient Account Number: [id number] Date of Birth/Sex: Treating RN: 1950-12-02 (72 y.o. Lawrence [name]) Carlene Coria Primary Care Rosangelica Pevehouse: Alma Friendly Other Clinician: Referring Aleta Manternach: Treating Ziare Cryder/Extender: Griselda Miner Weeks in Treatment: 0 Wound Status Wound Number: 2 Primary Diabetic Wound/Ulcer of the Lower Extremity Etiology: Wound Location: Left T Third oe Wound Open Wounding Event: Gradually Appeared Status: Date Acquired: 11/03/2022 Comorbid Glaucoma, Coronary Artery Disease, Hypertension, Myocardial Weeks Of Treatment: 0 History: Infarction, Type II Diabetes, End Stage Renal Disease Clustered Wound: No Photos EDIZ, NEYLON ([id number]) [id number] Page 8 of 10 Wound Measurements Length: (cm) 0.6 Width: (cm) 1 Depth: (cm) 0.1 Area: (cm) 0.471 Volume: (cm) 0.047 % Reduction in Area: % Reduction in Volume: Epithelialization: None Tunneling: No Undermining: No Wound Description Classification: Grade 2 Exudate Amount: Medium Exudate Type: Serosanguineous Exudate Color: red, brown Foul Odor After Cleansing: No Slough/Fibrino Yes Wound Bed Granulation Amount: None Present (0%) Exposed Structure Necrotic Amount: Large (67-100%) Fascia Exposed: No Necrotic Quality: Adherent Slough Fat Layer (Subcutaneous Tissue) Exposed: Yes Tendon Exposed: No Muscle Exposed: No Joint Exposed: No Bone Exposed: No Electronic Signature(s) Signed: 01/30/2023 1:09:18 PM By: Carlene Coria RN Entered By: Carlene Coria on 01/30/2023 11:53:39 -------------------------------------------------------------------------------- Wound Assessment Details Patient Name: Date of Service: [name], [name] 01/30/2023 11:30 A M Medical Record Number: [id number] Patient Account Number: [id number] Date of Birth/Sex: Treating RN: 08/05/1951 (72 y.o. Lawrence [name]) Carlene Coria Primary Care Keavon Sensing: Alma Friendly Other Clinician: Referring Fillmore Bynum: Treating Graylyn Bunney/Extender: Griselda Miner Weeks in Treatment: 0 Wound Status Wound Number: 3 Primary Diabetic Wound/Ulcer of the Lower Extremity Etiology: Wound Location: Right T Great oe Wound Open Wounding Event: Gradually  Appeared Status: Date Acquired: 12/04/2022 Comorbid Glaucoma, Coronary Artery Disease, Hypertension, Myocardial Weeks Of Treatment: 0 History: Infarction, Type II Diabetes, End Stage Renal Disease Clustered Wound: No Photos Wound Measurements Length: (cm) 0.5 Width: (cm) 0.4 Depth: (cm)  0.1 Area: (cm) 0.157 Volume: (cm) 0.016 % Reduction in Area: % Reduction in Volume: Epithelialization: None Tunneling: No Undermining: No Wound Description Classification: Grade 2 Exudate Amount: Medium Exudate Type: Serosanguineous Exudate Color: red, brown RICCO, PILOTO ([id number]) Foul Odor After Cleansing: No Slough/Fibrino No [id number].pdf Page 9 of 10 Wound Bed Granulation Amount: Large (67-100%) Exposed Structure Granulation Quality: Red, Pink Fascia Exposed: No Fat Layer (Subcutaneous Tissue) Exposed: Yes Tendon Exposed: No Muscle Exposed: No Joint Exposed: No Bone Exposed: No Electronic Signature(s) Signed: 01/30/2023 1:09:18 PM By: Carlene Coria RN Entered By: Carlene Coria on 01/30/2023 11:54:46 -------------------------------------------------------------------------------- Wound Assessment Details Patient Name: Date of Service: [name], [name] 01/30/2023 11:30 A M Medical Record Number: [id number] Patient Account Number: [id number] Date of Birth/Sex: Treating RN: 1951-01-26 (72 y.o. Lawrence [name]) Carlene Coria Primary Care Asiah Browder: Alma Friendly Other Clinician: Referring Aaralyn Kil: Treating Kandy Towery/Extender: Griselda Miner Weeks in Treatment: 0 Wound Status Wound Number: 4 Primary Diabetic Wound/Ulcer of the Lower Extremity Etiology: Wound Location: Right T Third oe Wound Open Wounding Event: Gradually Appeared Status: Date Acquired: 12/05/2022 Comorbid Glaucoma, Coronary Artery Disease, Hypertension, Myocardial Weeks Of Treatment: 0 History: Infarction, Type II Diabetes, End Stage Renal Disease Clustered Wound: No Photos Wound  Measurements Length: (cm) 1.2 Width: (cm) 2 Depth: (cm) 0.2 Area: (cm) 1.885 Volume: (cm) 0.377 % Reduction in Area: % Reduction in Volume: Epithelialization: None Tunneling: No Undermining: No Wound Description Classification: Grade 2 Exudate Amount: Medium Exudate Type: Serosanguineous Exudate Color: red, brown Foul Odor After Cleansing: No Slough/Fibrino Yes Wound Bed Granulation Amount: None Present (0%) Exposed Structure Necrotic Amount: Large (67-100%) Fascia Exposed: No Necrotic Quality: Eschar, Adherent Slough Fat Layer (Subcutaneous Tissue) Exposed: Yes Tendon Exposed: No Muscle Exposed: No Joint Exposed: No Bone Exposed: No Electronic Signature(s) Signed: 01/30/2023 1:09:18 PM By: Carlene Coria RN Brayman, Illene Regulus ([id number]) By: Carlene Coria RN [phone].pdf Page 10 of 10 Signed: 01/30/2023 1:09:18 PM Entered By: Carlene Coria on 01/30/2023 11:55:59 -------------------------------------------------------------------------------- Vitals Details Patient Name: Date of Service: [name], [name] 01/30/2023 11:30 A M Medical Record Number: [id number] Patient Account Number: [id number] Date of Birth/Sex: Treating RN: November 12, 1950 (72 y.o. Lawrence [name]) Carlene Coria Primary Care Gilverto Dileonardo: Alma Friendly Other Clinician: Referring Zyrion Coey: Treating Nanda Bittick/Extender: Griselda Miner Weeks in Treatment: 0 Vital Signs Time Taken: 11:30 Temperature (F): 97.7 Pulse (bpm): 60 Respiratory Rate (breaths/min): 18 Blood Pressure (mmHg): 165/100 Reference Range: 80 - 120 mg / dl Electronic Signature(s) Signed: 01/30/2023 1:09:18 PM By: Carlene Coria RN Entered By: Carlene Coria on 01/30/2023 11:30:32

## 2023-01-30 NOTE — Progress Notes (Signed)
Lawrence [name] ([id number]) [id number] Page 1 of 11 Visit Report for 01/30/2023 Chief Complaint Document Details Patient Name: Date of Service: [name], [name] 01/30/2023 11:30 A M Medical Record Number: [id number] Patient Account Number: [id number] Date of Birth/Sex: Treating RN: 11-01-1951 (72 y.o. Lawrence [name]) Carlene Coria Primary Care Provider: Alma Friendly Other Clinician: Referring Provider: Treating Provider/Extender: Griselda Miner Weeks in Treatment: 0 Information Obtained from: Patient Chief Complaint Bilateral foot ulcers Electronic Signature(s) Signed: 01/30/2023 12:07:14 PM By: Worthy Keeler PA-C Entered By: Worthy Keeler on 01/30/2023 12:07:13 -------------------------------------------------------------------------------- Debridement Details Patient Name: Date of Service: [name], [name] 01/30/2023 11:30 A M Medical Record Number: [id number] Patient Account Number: [id number] Date of Birth/Sex: Treating RN: 11/26/1950 (72 y.o. Lawrence [name]) Carlene Coria Primary Care Provider: Alma Friendly Other Clinician: Referring Provider: Treating Provider/Extender: Griselda Miner Weeks in Treatment: 0 Debridement Performed for Assessment: Wound #4 Left T Third oe Performed By: Physician Lawrence [name], PA-C Debridement Type: Debridement Severity of Tissue Pre Debridement: Fat layer exposed Level of Consciousness (Pre-procedure): Awake and Alert Pre-procedure Verification/Time Out Yes - 12:15 Taken: Start Time: 12:15 T Area Debrided (L x W): otal 1.2 (cm) x 2 (cm) = 2.4 (cm) Tissue and other material debrided: Callus, Slough, Subcutaneous, Skin: Dermis , Skin: Epidermis, Slough Level: Skin/Subcutaneous Tissue Debridement Description: Excisional Instrument: Curette Bleeding: Minimum Hemostasis Achieved: Pressure End Time: 12:20 Procedural Pain: 0 Post Procedural Pain: 0 Response to Treatment: Procedure was tolerated  well Level of Consciousness (Post- Awake and Alert procedure): Post Debridement Measurements of Total Wound Length: (cm) 1.2 Width: (cm) 2 Depth: (cm) 0.2 Volume: (cm) 0.377 Character of Wound/Ulcer Post Debridement: Improved Severity of Tissue Post Debridement: Fat layer exposed Post Procedure Diagnosis Same as Pre-procedure Electronic Signature(s) Signed: 01/30/2023 1:09:18 PM By: Carlene Coria RN Signed: 01/30/2023 2:14:36 PM By: Worthy Keeler PA-C Entered By: Carlene Coria on 01/30/2023 12:20:02 Lawrence [name] ([id number]) [id number] Page 2 of 11 -------------------------------------------------------------------------------- Debridement Details Patient Name: Date of Service: [name], [name] 01/30/2023 11:30 A M Medical Record Number: [id number] Patient Account Number: [id number] Date of Birth/Sex: Treating RN: [birth year] (72 y.o. Lawrence [name]) Carlene Coria Primary Care Provider: Alma Friendly Other Clinician: Referring Provider: Treating Provider/Extender: Griselda Miner Weeks in Treatment: 0 Debridement Performed for Assessment: Wound #1 Right T Second oe Performed By: Physician Lawrence [name], PA-C Debridement Type: Chemical/Enzymatic/Mechanical Agent Used: saline [name] Severity of Tissue Pre Debridement: Fat layer exposed Level of Consciousness (Pre-procedure): Awake and Alert Pre-procedure Verification/Time Out Yes - 12:21 Taken: Start Time: 12:21 Instrument: Other : saline [name] Bleeding: None End Time: 12:22 Procedural Pain: 0 Post Procedural Pain: 0 Response to Treatment: Procedure was tolerated well Level of Consciousness (Post- Awake and Alert procedure): Post Debridement Measurements of Total Wound Length: (cm) 1 Width: (cm) 1 Depth: (cm) 0.3 Volume: (cm) 0.236 Character of Wound/Ulcer Post Debridement: Improved Severity of Tissue Post Debridement: Fat layer exposed Post Procedure Diagnosis Same as  Pre-procedure Electronic Signature(s) Signed: 01/30/2023 12:50:31 PM By: Carlene Coria RN Signed: 01/30/2023 2:14:36 PM By: Worthy Keeler PA-C Entered By: Carlene Coria on 01/30/2023 12:50:31 -------------------------------------------------------------------------------- Debridement Details Patient Name: Date of Service: Lawrence Schultze MS, [name] 01/30/2023 11:30 A M Medical Record Number: [id number] Patient Account Number: [id number] Date of Birth/Sex: Treating RN: 21-Jul-1951 (72 y.o. Lawrence [name] Primary Care Provider: Alma Friendly Other Clinician: Referring Provider: Treating Provider/Extender: Griselda Miner Weeks in  Treatment: 0 Debridement Performed for Assessment: Wound #2 Right T Third oe Performed By: Physician Lawrence [name], PA-C Debridement Type: Chemical/Enzymatic/Mechanical Agent Used: saline [name] Severity of Tissue Pre Debridement: Fat layer exposed Level of Consciousness (Pre-procedure): Awake and Alert Pre-procedure Verification/Time Out Yes - 12:21 Taken: Start Time: 12:21 Instrument: Other : saline [name] Bleeding: None End Time: 12:22 Procedural Pain: 0 Post Procedural Pain: 0 Response to Treatment: Procedure was tolerated well Level of Consciousness (Post- Awake and Alert procedure): Post Debridement Measurements of Total Wound Lawrence [name] (Lawrence [name]) [id number] Page 3 of 11 Length: (cm) 0.6 Width: (cm) 1 Depth: (cm) 0.1 Volume: (cm) 0.047 Character of Wound/Ulcer Post Debridement: Improved Severity of Tissue Post Debridement: Fat layer exposed Post Procedure Diagnosis Same as Pre-procedure Electronic Signature(s) Signed: 01/30/2023 12:51:38 PM By: Carlene Coria RN Signed: 01/30/2023 2:14:36 PM By: Worthy Keeler PA-C Entered By: Carlene Coria on 01/30/2023 12:51:38 -------------------------------------------------------------------------------- Debridement Details Patient Name: Date of Service: A DA  MS, [name] 01/30/2023 11:30 A M Medical Record Number: Lawrence [name] Patient Account Number: [id number] Date of Birth/Sex: Treating RN: [birth year] (72 y.o. Lawrence [name]) Carlene Coria Primary Care Provider: Alma Friendly Other Clinician: Referring Provider: Treating Provider/Extender: Griselda Miner Weeks in Treatment: 0 Debridement Performed for Assessment: Wound #3 Left T Great oe Performed By: Physician Lawrence [name], PA-C Debridement Type: Chemical/Enzymatic/Mechanical Agent Used: saline [name] Severity of Tissue Pre Debridement: Fat layer exposed Level of Consciousness (Pre-procedure): Awake and Alert Pre-procedure Verification/Time Out Yes - 12:21 Taken: Start Time: 12:21 Instrument: Other : saline [name] Bleeding: None End Time: 12:22 Procedural Pain: 0 Post Procedural Pain: 0 Response to Treatment: Procedure was tolerated well Level of Consciousness (Post- Awake and Alert procedure): Post Debridement Measurements of Total Wound Length: (cm) 0.5 Width: (cm) 0.4 Depth: (cm) 0.1 Volume: (cm) 0.016 Character of Wound/Ulcer Post Debridement: Improved Severity of Tissue Post Debridement: Fat layer exposed Post Procedure Diagnosis Same as Pre-procedure Electronic Signature(s) Signed: 01/30/2023 12:52:14 PM By: Carlene Coria RN Signed: 01/30/2023 2:14:36 PM By: Worthy Keeler PA-C Entered By: Carlene Coria on 01/30/2023 12:52:13 -------------------------------------------------------------------------------- HPI Details Patient Name: Date of Service: Lawrence Schultze MS, [name] 01/30/2023 11:30 A M Medical Record Number: Lawrence [name] Patient Account Number: [id number] Date of Birth/Sex: Treating RN: [birth year] (72 y.o. Lawrence [name] Primary Care Provider: Alma Friendly Other Clinician: Referring Provider: Treating Provider/Extender: Lawrence [name] in Treatment: 0 Lawrence [name] (Lawrence [name]) [id number].pdf Page 4 of 11 History of  Present Illness HPI Description: 01-30-2023 upon evaluation today patient appears to be doing poorly currently in regard to his feet bilaterally. He is actually referral from Dr. Daylene Katayama who is a local podiatrist. Subsequently Dr. Amalia Hailey has been seeing him but wanted to refer him to [place] due to the fact that he is having difficulty healing in regard to his feet bilaterally. He has had several amputations. 1 back in the summer 2023 of the great toe right foot. Subsequently had a partial digit amputation of the second toe that was performed in November 2023. Subsequently since that time he had a hard time getting this to heal. Fortunately there does not appear to be any signs of infection has been using gentamicin currently as the treatment of choice. With that being said I feel like that we need to try to see about drying some of these wounds off of it which is my biggest concern at this point. Also think that he could potentially have osteomyelitis of this  right foot he has not had an x-ray of the left foot Emina suggest MRI right foot and x-ray left foot he is already had an x-ray of the right foot at Dr. Amalia Hailey office and that did not reveal obvious signs of osteomyelitis but he does have bone exposure in the distal portion of the second toe right foot digit. Patient does have a history significant for diabetes mellitus type 1, hypertension, peripheral vascular disease, chronic kidney disease stage IIIa. He also has undergone arterial study which was actually performed on 12-08-2021 and this shows that he actually has good arterial flow into the right lower extremity and left lower extremity for that matter. He had noncompressible ABIs but on the left he had a TBI of 0.68 and in regard to his flow it was actually better on the right even compared to the left with good pressures at all locations and again there does not appear to be any significant peripheral vascular disease that would prevent  healing. Electronic Signature(s) Signed: 01/30/2023 2:10:01 PM By: Worthy Keeler PA-C Entered By: Worthy Keeler on 01/30/2023 14:10:01 -------------------------------------------------------------------------------- Physical Exam Details Patient Name: Date of Service: [name], [name] 01/30/2023 11:30 A M Medical Record Number: Lawrence [name] Patient Account Number: [id number] Date of Birth/Sex: Treating RN: 12/31/1950 (72 y.o. Lawrence [name] Primary Care Provider: Alma Friendly Other Clinician: Referring Provider: Treating Provider/Extender: Griselda Miner Weeks in Treatment: 0 Constitutional patient is hypertensive.. pulse regular and within target range for patient.[name] respirations regular, non-labored and within target range for patient.[name] temperature within target range for patient.. Obese and well-hydrated in no acute distress. Eyes conjunctiva clear no eyelid edema noted. pupils equal round and reactive to light and accommodation. Ears, Nose, Mouth, and Throat no gross abnormality of ear auricles or external auditory canals. normal hearing noted during conversation. mucus membranes moist. Respiratory normal breathing without difficulty. Cardiovascular 1+ dorsalis pedis/posterior tibialis pulses. no clubbing, cyanosis, significant edema, <3 sec cap refill. Musculoskeletal normal gait and posture. no significant deformity or arthritic changes, no loss or range of motion, no clubbing. Psychiatric this patient is able to make decisions and demonstrates good insight into disease process. Alert and Oriented x 3. pleasant and cooperative. Notes Upon inspection patient's wound on the third toe left foot did require debridement today to clearway some of the necrotic debris. Fortunately I was able to perform the debridement without complication and postdebridement this appears to be doing much better which is great news with that being said I do not see any signs of active  infection at this time which is good news. Nonetheless I do believe that he is going to be on the right track to try to get some things heal but we need to evaluate as to whether or not he indeed does have osteomyelitis of the right foot I am unsure of that right now but is something that I think we need to be very aware of and we fully evaluate because if he does that we need to be aggressive and try to prevent additional and further amputation this could include hyperbarics. Electronic Signature(s) Signed: 01/30/2023 2:10:58 PM By: Worthy Keeler PA-C Previous Signature: 01/30/2023 2:10:48 PM Version By: Worthy Keeler PA-C Entered By: Worthy Keeler on 01/30/2023 14:10:58 -------------------------------------------------------------------------------- Physician Orders Details Patient Name: Date of Service: [name], [name] 01/30/2023 11:30 A M Medical Record Number: Lawrence [name] Patient Account Number: [id number] Date of Birth/Sex: Treating RN: 10-19-1951 (72  y.o. Lawrence [name] Primary Care Provider: Alma Friendly Other Clinician: Scherrie [name] ([id number]) [id number] Page 5 of 11 Referring Provider: Treating Provider/Extender: Lawrence [name] in Treatment: 0 Verbal / Phone Orders: No Diagnosis Coding ICD-10 Coding Code Description E10.621 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 I25.10 Atherosclerotic heart disease of native coronary artery without angina pectoris I10 Essential (primary) hypertension I73.89 Other specified peripheral vascular diseases N18.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, stage 3a Follow-up Appointments Return Appointment in 1 week. Anesthetic (Use 'Patient Medications' Section for Anesthetic Order Entry) Lidocaine applied to wound bed Edema  Control - Lymphedema / Segmental Compressive Device / Other Elevate, Exercise Daily and A void Standing for Long Periods of Time. Elevate legs to the level of the heart and pump ankles as often as possible Elevate leg(s) parallel to the floor when sitting. Off-Loading Open toe surgical shoe - right and left foot Wound Treatment Wound #1 - T Second oe Wound Laterality: Right Cleanser: Soap and Water 3 x Per Week/30 Days Discharge Instructions: Gently cleanse wound with antibacterial soap, rinse and pat dry prior to dressing wounds Prim Dressing: Silvercel Small 2x2 (in/in) 3 x Per Week/30 Days ary Discharge Instructions: Apply Silvercel Small 2x2 (in/in) as instructed Secondary Dressing: Coverlet Latex-Free Fabric Adhesive Dressings 3 x Per Week/30 Days Discharge Instructions: 1.5 x 2 Wound #2 - T Third oe Wound Laterality: Right Cleanser: Soap and Water 3 x Per Week/30 Days Discharge Instructions: Gently cleanse wound with antibacterial soap, rinse and pat dry prior to dressing wounds Prim Dressing: Silvercel Small 2x2 (in/in) 3 x Per Week/30 Days ary Discharge Instructions: Apply Silvercel Small 2x2 (in/in) as instructed Secondary Dressing: Coverlet Latex-Free Fabric Adhesive Dressings 3 x Per Week/30 Days Discharge Instructions: 1.5 x 2 Wound #3 - T Great oe Wound Laterality: Left Cleanser: Soap and Water 3 x Per Week/30 Days Discharge Instructions: Gently cleanse wound with antibacterial soap, rinse and pat dry prior to dressing wounds Prim Dressing: Silvercel Small 2x2 (in/in) 3 x Per Week/30 Days ary Discharge Instructions: Apply Silvercel Small 2x2 (in/in) as instructed Secondary Dressing: Coverlet Latex-Free Fabric Adhesive Dressings 3 x Per Week/30 Days Discharge Instructions: 1.5 x 2 Wound #4 - T Third oe Wound Laterality: Left Cleanser: Soap and Water 3 x Per Week/30 Days Discharge Instructions: Gently cleanse wound with antibacterial soap, rinse and pat dry prior to  dressing wounds Prim Dressing: Silvercel Small 2x2 (in/in) 3 x Per Week/30 Days ary Discharge Instructions: Apply Silvercel Small 2x2 (in/in) as instructed Secondary Dressing: Coverlet Latex-Free Fabric Adhesive Dressings 3 x Per Week/30 Days Discharge Instructions: 1.5 x 2 EDMUNDO, LONSDALE ([id number]) [id number] Page 6 of 11 Radiology X-ray, foot left - non healing wound with exposed bone - (ICD10 E10.621 - 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) MRI with and without Contrast right foot - non healing wound - (ICD10 E10.621 - 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) Patient Medications llergies: fluvastatin, nitroglycerin, rosuvastatin, simvastatin, ezetimibe, Lipitor, lisinopril A Notifications Medication Indication Start End 01/30/2023 doxycycline hyclate DOSE 1 - oral 100 mg capsule - 1 capsule oral twice a day x 30 days. Do not take calcium and magnesium at the same time as your antibiotic. Electronic Signature(s) Signed: 01/30/2023 2:12:33 PM By: Worthy Keeler PA-C Previous Signature: 01/30/2023 1:09:18 PM Version By: Carlene Coria RN Entered By: Joaquim Lai  IIIMargarita [name] on 01/30/2023 14:12:32 -------------------------------------------------------------------------------- Problem List Details Patient Name: Date of Service: [name], [name] 01/30/2023 11:30 A M Medical Record Number: Lawrence [name] Patient Account Number: [id number] Date of Birth/Sex: Treating RN: 1951-03-24 (72 y.o. Lawrence [name]) Carlene Coria Primary Care Provider: Alma Friendly Other Clinician: Referring Provider: Treating Provider/Extender: Griselda Miner Weeks in Treatment: 0 Active Problems ICD-10 Encounter Code Description Active Date MDM Diagnosis E10.621 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 01/30/2023 No Yes L97.522 Non-pressure chronic ulcer of other part of left foot with fat layer exposed 01/30/2023 No Yes L97.512 Non-pressure chronic ulcer of other part of right foot  with fat layer exposed 01/30/2023 No Yes I25.10 Atherosclerotic heart disease of native coronary artery without angina pectoris 01/30/2023 No Yes I10 Essential (primary) hypertension 01/30/2023 No Yes I73.89 Other specified peripheral vascular diseases 01/30/2023 No Yes N18.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, stage 3a 01/30/2023 No Yes Inactive Problems Resolved Problems Electronic Signature(s) Signed: 01/30/2023 12:06:57 PM By: Carron Brazen (Lawrence [name]) [id number] Page 7 of 11 Entered By: Worthy Keeler on 01/30/2023 12:06:57 -------------------------------------------------------------------------------- Progress Note Details Patient Name: Date of Service: [name], [name] 01/30/2023 11:30 A M Medical Record Number: Lawrence [name] Patient Account Number: [id number] Date of Birth/Sex: Treating RN: 23-Mar-1951 (72 y.o. Lawrence [name]) Carlene Coria Primary Care Provider: Alma Friendly Other Clinician: Referring Provider: Treating Provider/Extender: Griselda Miner Weeks in Treatment: 0 Subjective Chief Complaint Information obtained from Patient Bilateral foot ulcers History of Present Illness (HPI) 01-30-2023 upon evaluation today patient appears to be doing poorly currently in regard to his feet bilaterally. He is actually referral from Dr. Daylene Katayama who is a local podiatrist. Subsequently Dr. Amalia Hailey has been seeing him but wanted to refer him to [place] due to the fact that he is having difficulty healing in regard to his feet bilaterally. He has had several amputations. 1 back in the summer 2023 of the great toe right foot. Subsequently had a partial digit amputation of the second toe that was performed in November 2023. Subsequently since that time he had a hard time getting this to heal. Fortunately there does not appear to be any signs of infection has been using gentamicin currently as the treatment of choice. With that being said I feel like that  we need to try to see about drying some of these wounds off of it which is my biggest concern at this point. Also think that he could potentially have osteomyelitis of this right foot he has not had an x-ray of the left foot E[facility] suggest MRI right foot and x-ray left foot he is already had an x-ray of the right foot at Dr. Amalia Hailey office and that did not reveal obvious signs of osteomyelitis but he does have bone exposure in the distal portion of the second toe right foot digit. Patient does have a history significant for diabetes mellitus type 1, hypertension, peripheral vascular disease, chronic kidney disease stage IIIa. He also has undergone arterial study which was actually performed on 12-08-2021 and this shows that he actually has good arterial flow into the right lower extremity and left lower extremity for that matter. He had noncompressible ABIs but on the left he had a TBI of 0.68 and in regard to his flow it was actually better on the right even compared to the left with good pressures at all locations and again there does not appear to be any significant peripheral vascular disease that would  prevent healing. Patient History Allergies fluvastatin, nitroglycerin, rosuvastatin, simvastatin, ezetimibe, Lipitor, lisinopril Social History Former smoker, Marital Status - Married, Alcohol Use - Never, Drug Use - No History, Caffeine Use - Rarely. Medical History Eyes Patient has history of Glaucoma Cardiovascular Patient has history of Coronary Artery Disease, Hypertension, Myocardial Infarction Endocrine Patient has history of Type II Diabetes Genitourinary Patient has history of End Stage Renal Disease Patient is treated with Insulin. Blood sugar is not tested. Review of Systems (ROS) Eyes Complains or has symptoms of Glasses / Contacts. Integumentary (Skin) Complains or has symptoms of Wounds. Objective Constitutional patient is hypertensive.. pulse regular and within target  range for patient.[name] respirations regular, non-labored and within target range for patient.[name] temperature within target range for patient.. Obese and well-hydrated in no acute distress. Vitals Time Taken: 11:30 AM, Temperature: 97.7 F, Pulse: 60 bpm, Respiratory Rate: 18 breaths/min, Blood Pressure: 165/100 mmHg. Eyes conjunctiva clear no eyelid edema noted. pupils equal round and reactive to light and accommodation. Ears, Nose, Mouth, and Throat no gross abnormality of ear auricles or external auditory canals. normal hearing noted during conversation. mucus membranes moist. KREE, PRINZ (Lawrence [name]) [id number] Page 8 of 11 Respiratory normal breathing without difficulty. Cardiovascular 1+ dorsalis pedis/posterior tibialis pulses. no clubbing, cyanosis, significant edema, Musculoskeletal normal gait and posture. no significant deformity or arthritic changes, no loss or range of motion, no clubbing. Psychiatric this patient is able to make decisions and demonstrates good insight into disease process. Alert and Oriented x 3. pleasant and cooperative. General Notes: Upon inspection patient's wound on the third toe left foot did require debridement today to clearway some of the necrotic debris. Fortunately I was able to perform the debridement without complication and postdebridement this appears to be doing much better which is great news with that being said I do not see any signs of active infection at this time which is good news. Nonetheless I do believe that he is going to be on the right track to try to get some things heal but we need to evaluate as to whether or not he indeed does have osteomyelitis of the right foot I am unsure of that right now but is something that I think we need to be very aware of and we fully evaluate because if he does that we need to be aggressive and try to prevent additional and further amputation this could include  hyperbarics. Integumentary (Hair, Skin) Wound #1 status is Open. Original cause of wound was Gradually Appeared. The date acquired was: 11/03/2022. The wound is located on the Right T Second. oe The wound measures 1cm length x 1cm width x 0.3cm depth; 0.785cm^2 area and 0.236cm^3 volume. There is bone and Fat Layer (Subcutaneous Tissue) exposed. There is no tunneling or undermining noted. There is a medium amount of serosanguineous drainage noted. There is a large (67-100%) amount of necrotic tissue within the wound bed including Adherent Slough. Wound #2 status is Open. Original cause of wound was Gradually Appeared. The date acquired was: 11/03/2022. The wound is located on the Right T Third. The oe wound measures 0.6cm length x 1cm width x 0.1cm depth; 0.471cm^2 area and 0.047cm^3 volume. There is Fat Layer (Subcutaneous Tissue) exposed. There is no tunneling or undermining noted. There is a medium amount of serosanguineous drainage noted. There is no granulation within the wound bed. There is a large (67-100%) amount of necrotic tissue within the wound bed including Adherent Slough. Wound #3 status is Open. Original cause of wound  was Gradually Appeared. The date acquired was: 12/04/2022. The wound is located on the Left T Great. The oe wound measures 0.5cm length x 0.4cm width x 0.1cm depth; 0.157cm^2 area and 0.016cm^3 volume. There is Fat Layer (Subcutaneous Tissue) exposed. There is no tunneling or undermining noted. There is a medium amount of serosanguineous drainage noted. There is large (67-100%) red, pink granulation within the wound bed. Wound #4 status is Open. Original cause of wound was Gradually Appeared. The date acquired was: 12/05/2022. The wound is located on the Left T Third. The oe wound measures 1.2cm length x 2cm width x 0.2cm depth; 1.885cm^2 area and 0.377cm^3 volume. There is Fat Layer (Subcutaneous Tissue) exposed. There is no tunneling or undermining noted. There is a  medium amount of serosanguineous drainage noted. There is no granulation within the wound bed. There is a large (67-100%) amount of necrotic tissue within the wound bed including Eschar and Adherent Slough. 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 There was a Chemical/Enzymatic/Mechanical debridement performed by Lawrence [name], PA-C. With the following instrument(s): saline [name] Other agent used was saline [name] A time out was conducted at 12:21, prior to the start of the procedure. There was no bleeding. The procedure was tolerated well with a pain level of 0 throughout and a pain level of 0 following the procedure. Post Debridement Measurements: 1cm length x 1cm width x 0.3cm depth; 0.236cm^3 volume. Character of Wound/Ulcer Post Debridement is improved. Severity of Tissue Post Debridement is: Fat layer exposed. 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 There was a Chemical/Enzymatic/Mechanical debridement performed by Lawrence [name], PA-C. With the following instrument(s): saline [name] Other agent used was saline [name] A time out was conducted at 12:21, prior to the start of the procedure. There was no bleeding. The  procedure was tolerated well with a pain level of 0 throughout and a pain level of 0 following the procedure. Post Debridement Measurements: 0.6cm length x 1cm width x 0.1cm depth; 0.047cm^3 volume. Character of Wound/Ulcer Post Debridement is improved. Severity of Tissue Post Debridement is: Fat layer exposed. Post procedure Diagnosis Wound #2: Same as Pre-Procedure Wound #3 Pre-procedure diagnosis of Wound #3 is a Diabetic Wound/Ulcer of the Lower Extremity located on the Left T Great .Severity of Tissue Pre Debridement is: oe Fat layer exposed. There was a Chemical/Enzymatic/Mechanical debridement performed by Lawrence [name], PA-C. With the following instrument(s): saline [name] Other agent used was saline [name] A time out was conducted at 12:21, prior to the start of the procedure. There was no bleeding. The procedure was tolerated well with a pain level of 0 throughout and a pain level of 0 following the procedure. Post Debridement Measurements: 0.5cm length x 0.4cm width x 0.1cm depth; 0.016cm^3 volume. Character of Wound/Ulcer Post Debridement is improved. Severity of Tissue Post  Debridement is: Fat layer exposed. Lawrence [name] ([id number]) [id number] Page 9 of 11 Post procedure Diagnosis Wound #3: Same as Pre-Procedure Wound #4 Pre-procedure diagnosis of Wound #4 is a Diabetic Wound/Ulcer of the Lower Extremity located on the Left T Third .Severity of Tissue Pre Debridement is: oe Fat layer exposed. There was a Excisional Skin/Subcutaneous Tissue Debridement with a total area of 2.4 sq cm performed by Lawrence [name], PA-C. With the following instrument(s): Curette Material removed includes Callus, Subcutaneous Tissue, Slough, Skin: Dermis, and Skin: Epidermis. A time out was conducted at 12:15, prior to the start of the procedure. A Minimum amount of bleeding was controlled with Pressure. The procedure was tolerated well with a pain level of 0 throughout  and a pain level of 0 following the procedure. Post Debridement Measurements: 1.2cm length x 2cm width x 0.2cm depth; 0.377cm^3 volume. Character of Wound/Ulcer Post Debridement is improved. Severity of Tissue Post Debridement is: Fat layer exposed. Post procedure Diagnosis Wound #4: Same as Pre-Procedure Plan Follow-up Appointments: Return Appointment in 1 week. Anesthetic (Use 'Patient Medications' Section for Anesthetic Order Entry): Lidocaine applied to wound bed Edema Control - Lymphedema / Segmental Compressive Device / Other: Elevate, Exercise Daily and Avoid Standing for Long Periods of Time. Elevate legs to the level of the heart and pump ankles as often as possible Elevate leg(s) parallel to the floor when sitting. Off-Loading: Open toe surgical shoe - right and left foot Radiology ordered were: X-ray, foot left - non healing wound with exposed bone, MRI with and without Contrast right foot - non healing wound The following medication(s) was prescribed: doxycycline hyclate oral 100 mg capsule 1 1 capsule oral twice a day x 30 days. Do not take calcium and magnesium at the same time as your antibiotic. starting 01/30/2023 WOUND #1: - T Second Wound Laterality: Right oe Cleanser: Soap and Water 3 x Per Week/30 Days Discharge Instructions: Gently cleanse wound with antibacterial soap, rinse and pat dry prior to dressing wounds Prim Dressing: Silvercel Small 2x2 (in/in) 3 x Per Week/30 Days ary Discharge Instructions: Apply Silvercel Small 2x2 (in/in) as instructed Secondary Dressing: Coverlet Latex-Free Fabric Adhesive Dressings 3 x Per Week/30 Days Discharge Instructions: 1.5 x 2 WOUND #2: - T Third Wound Laterality: Right oe Cleanser: Soap and Water 3 x Per Week/30 Days Discharge Instructions: Gently cleanse wound with antibacterial soap, rinse and pat dry prior to dressing wounds Prim Dressing: Silvercel Small 2x2 (in/in) 3 x Per Week/30 Days ary Discharge Instructions:  Apply Silvercel Small 2x2 (in/in) as instructed Secondary Dressing: Coverlet Latex-Free Fabric Adhesive Dressings 3 x Per Week/30 Days Discharge Instructions: 1.5 x 2 WOUND #3: - T Great Wound Laterality: Left oe Cleanser: Soap and Water 3 x Per Week/30 Days Discharge Instructions: Gently cleanse wound with antibacterial soap, rinse and pat dry prior to dressing wounds Prim Dressing: Silvercel Small 2x2 (in/in) 3 x Per Week/30 Days ary Discharge Instructions: Apply Silvercel Small 2x2 (in/in) as instructed Secondary Dressing: Coverlet Latex-Free Fabric Adhesive Dressings 3 x Per Week/30 Days Discharge Instructions: 1.5 x 2 WOUND #4: - T Third Wound Laterality: Left oe Cleanser: Soap and Water 3 x Per Week/30 Days Discharge Instructions: Gently cleanse wound with antibacterial soap, rinse and pat dry prior to dressing wounds Prim Dressing: Silvercel Small 2x2 (in/in) 3 x Per Week/30 Days ary Discharge Instructions: Apply Silvercel Small 2x2 (in/in) as instructed Secondary Dressing: Coverlet Latex-Free Fabric Adhesive Dressings 3 x Per Week/30 Days Discharge Instructions: 1.5 x 2 1. I  am good recommend currently based on what I am seeing that we go ahead and initiate treatment with the patient with regard to antibiotics with doxycycline as a preventative measure. I am concerned about the possibility of infection here and again he is taking the doxycycline before without complication so this should be a good option for him. If we indeed do find that he has osteomyelitis for certain I will probably add Flagyl as well as Cipro in order to see if we can get things under control and moving in the right direction. Patient is in agreement with the plan. 2. For the time being I am also going to go ahead and have the patient scheduled for an MRI of the right foot. I think this may represent a site of osteomyelitis and I need to fully evaluate that the x-rays have not been revealing in order to confirm  or deny the possibility of osteomyelitis especially in light of there being exposed bone at this point. I discussed this with the patient he is in agreement with the plan. Subsequently we will see where things stand following the MRI. 3. I am also going to perform an x-ray of the left foot this has not been done as of yet I want to ensure there is no signs of any breakdown of the bone in regard to the third toe. Of note there is already been an x-ray of the right foot which was performed in Dr. Amalia Hailey office February 2024. 4. I am going to suggest that we initiate treatment with silver cell to all areas followed by a Band-Aid or similar item to secure in place. He is in agreement with this plan. We will see patient back for reevaluation in 1 week here in the clinic. If anything worsens or changes patient will contact our office for additional recommendations. Electronic Signature(s) Signed: 01/30/2023 2:13:59 PM By: Worthy Keeler PA-C Entered By: Worthy Keeler on 01/30/2023 14:13:59 [name], Lawrence Regulus (Lawrence [name]) [id number] Page 10 of 11 -------------------------------------------------------------------------------- ROS/PFSH Details Patient Name: Date of Service: [name], [name] 01/30/2023 11:30 A M Medical Record Number: Lawrence [name] Patient Account Number: [id number] Date of Birth/Sex: Treating RN: 07/02/1951 (72 y.o. Lawrence [name]) Carlene Coria Primary Care Provider: Alma Friendly Other Clinician: Referring Provider: Treating Provider/Extender: Griselda Miner Weeks in Treatment: 0 Eyes Complaints and Symptoms: Positive for: Glasses / Contacts Medical History: Positive for: Glaucoma Integumentary (Skin) Complaints and Symptoms: Positive for: Wounds Cardiovascular Medical History: Positive for: Coronary Artery Disease; Hypertension; Myocardial Infarction Endocrine Medical History: Positive for: Type II Diabetes Time with diabetes: 73 Treated  with: Insulin Blood sugar tested every day: No Genitourinary Medical History: Positive for: End Stage Renal Disease HBO Extended History Items Eyes: Glaucoma Immunizations Pneumococcal Vaccine: Received Pneumococcal Vaccination: No Implantable Devices None Family and Social History Former smoker; Marital Status - Married; Alcohol Use: Never; Drug Use: No History; Caffeine Use: Rarely Electronic Signature(s) Signed: 01/30/2023 1:09:18 PM By: Carlene Coria RN Signed: 01/30/2023 2:14:36 PM By: Worthy Keeler PA-C Entered By: Carlene Coria on 01/30/2023 11:40:46 -------------------------------------------------------------------------------- SuperBill Details Patient Name: Date of Service: Lawrence Schultze MS, [name] 01/30/2023 Medical Record Number: Lawrence [name] Patient Account Number: [id number] Date of Birth/Sex: Treating RN: 1951/10/28 (72 y.o. Lawrence [name] Primary Care Provider: Alma Friendly Other Clinician: Referring Provider: Treating Provider/Extender: Lawrence [name] in Treatment: 0 Diagnosis Coding Lawrence [name] (Lawrence [name]) [id number] Page 11 of 11 ICD-10 Codes Code Description E10.621 Type 1 diabetes  mellitus with foot ulcer 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 I25.10 Atherosclerotic heart disease of native coronary artery without angina pectoris I10 Essential (primary) hypertension I73.89 Other specified peripheral vascular diseases N18.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, stage 3a Facility Procedures : CPT4 Code: [id number] Description: 99213 - WOUND CARE VISIT-LEV 3 EST PT Modifier: Quantity: 1 : CPT4 Code: [id number] Description: [id number] - DEB SUBQ TISSUE 20 SQ CM/< ICD-10 Diagnosis Description L97.522 Non-pressure chronic ulcer of other part of left foot with fat layer exposed Modifier: Quantity:  1 Physician Procedures : CPT4 Code Description Modifier [id number] - WC PHYS LEVEL 4 - NEW PT 25 ICD-10 Diagnosis Description E10.621 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 L97.522 Non-pressure chronic ulcer of other part of left foot with fat layer exposed L97.512  Non-pressure chronic ulcer of other part of right foot with fat layer exposed I25.10 Atherosclerotic heart disease of native coronary artery without angina pectoris Quantity: 1 : [id number] - WC PHYS SUBQ TISS 20 SQ CM ICD-10 Diagnosis Description L97.522 Non-pressure chronic ulcer of other part of left foot with fat layer exposed Quantity: 1 Electronic Signature(s) Signed: 01/30/2023 2:14:23 PM By: Worthy Keeler PA-C Previous Signature: 01/30/2023 12:48:33 PM Version By: Carlene Coria RN Entered By: Worthy Keeler on 01/30/2023 14:14:22

## 2023-01-30 NOTE — Progress Notes (Signed)
Lawrence [name], Lawrence [name] ([id number]) [phone] [id number] Page 1 of 4 Visit Report for 01/30/2023 Abuse Risk Screen Details Patient Name: Date of Service: [name], [name] 01/30/2023 11:30 A M Medical Record Number: [id number] Patient Account Number: [id number] Date of Birth/Sex: Treating RN: 11-27-1950 (72 y.o. Lawrence [name]) Carlene Coria Primary Care Fortune Torosian: Alma Friendly Other Clinician: Referring Lucrezia Dehne: Treating Oseas Detty/Extender: Griselda Miner Weeks in Treatment: 0 Abuse Risk Screen Items Answer ABUSE RISK SCREEN: Has anyone close to you tried to hurt or harm you recentlyo No Do you feel uncomfortable with anyone in your familyo No Has anyone forced you do things that you didnt want to doo No Electronic Signature(s) Signed: 01/30/2023 1:09:18 PM By: Carlene Coria RN Entered By: Carlene Coria on 01/30/2023 11:40:52 -------------------------------------------------------------------------------- Activities of Daily Living Details Patient Name: Date of Service: [name], [name] 01/30/2023 11:30 A M Medical Record Number: [id number] Patient Account Number: [id number] Date of Birth/Sex: Treating RN: Jul 21, 1951 (72 y.o. Lawrence [name]) Carlene Coria Primary Care Nathaneil Feagans: Alma Friendly Other Clinician: Referring Mieshia Pepitone: Treating Artis Buechele/Extender: Griselda Miner Weeks in Treatment: 0 Activities of Daily Living Items Answer Activities of Daily Living (Please select one for each item) Drive Automobile Not Able T Medications ake Completely Able Use T elephone Completely Able Care for Appearance Completely Able Use T oilet Completely Able Bath / Shower Completely Able Dress Self Completely Able Feed Self Completely Able Walk Completely Able Get In / Out Bed Completely Able Housework Completely Able Prepare Meals Completely [place] for Self Completely Able Electronic Signature(s) Signed: 01/30/2023 1:09:18 PM By:  Carlene Coria RN Entered By: Carlene Coria on 01/30/2023 11:41:26 -------------------------------------------------------------------------------- Education Screening Details Patient Name: Date of Service: Lawrence Schultze MS, [name] 01/30/2023 11:30 A M Medical Record Number: [id number] Patient Account Number: [id number] Date of Birth/Sex: Treating RN: 05/18/1951 (72 y.o. Lawrence [name] Primary Care Maggie Senseney: Alma Friendly Other Clinician: Referring Takisha Pelle: Treating Arelis Neumeier/Extender: Primitivo Gauze in Treatment: [place], Illene Regulus ([id number]) [phone] [id number] Page 2 of 4 Primary Learner Assessed: Patient Learning Preferences/Education Level/Primary Language Learning Preference: Explanation Highest Education Level: College or Above Preferred Language: English Cognitive Barrier Language Barrier: No Translator Needed: No Memory Deficit: No Emotional Barrier: No Cultural/Religious Beliefs Affecting Medical Care: No Physical Barrier Impaired Vision: Yes Glasses Impaired Hearing: No Decreased Hand dexterity: No Knowledge/Comprehension Knowledge Level: Medium Comprehension Level: High Ability to understand written instructions: High Ability to understand verbal instructions: High Motivation Anxiety Level: Anxious Cooperation: Cooperative Education Importance: Acknowledges Need Interest in Health Problems: Asks Questions Perception: Coherent Willingness to Engage in Self-Management High Activities: Readiness to Engage in Self-Management High Activities: Electronic Signature(s) Signed: 01/30/2023 1:09:18 PM By: Carlene Coria RN Entered By: Carlene Coria on 01/30/2023 11:43:27 -------------------------------------------------------------------------------- Fall Risk Assessment Details Patient Name: Date of Service: [name], [name] 01/30/2023 11:30 A M Medical Record Number: [id number] Patient Account Number: [id number] Date of  Birth/Sex: Treating RN: 06/16/1951 (72 y.o. Lawrence [name]) Carlene Coria Primary Care Arlisa Leclere: Alma Friendly Other Clinician: Referring Randi Poullard: Treating Lineth Thielke/Extender: Griselda Miner Weeks in Treatment: 0 Fall Risk Assessment Items Have you had 2 or more falls in the last 12 monthso 0 No Have you had any fall that resulted in injury in the last 12 monthso 0 No FALLS RISK SCREEN History of falling - immediate or within 3 months 0 No Secondary diagnosis (Do you have 2 or more medical diagnoseso) 0 No Ambulatory  aid None/bed rest/wheelchair/nurse 0 Yes Crutches/cane/walker 0 No Furniture 0 No Intravenous therapy Access/Saline/Heparin Lock 0 No Gait/Transferring Normal/ bed rest/ wheelchair 0 Yes Weak (short steps with or without shuffle, stooped but able to lift head while walking, may seek 0 No support from furniture) Impaired (short steps with shuffle, may have difficulty arising from chair, head down, impaired 0 No balance) Mental Status Oriented to own ability 0 Yes Overestimates or forgets limitations 0 No Risk Level: Low Risk Score: 0 Lawrence [name], Lawrence [name] ([id number]) [phone] [id number] Page 3 of 4 Electronic Signature(s) -------------------------------------------------------------------------------- Foot Assessment Details Patient Name: Date of Service: [name], [name] 01/30/2023 11:30 A M Medical Record Number: [id number] Patient Account Number: [id number] Date of Birth/Sex: Treating RN: Mar 06, 1951 (72 y.o. Lawrence [name]) Carlene Coria Primary Care Aundria Bitterman: Alma Friendly Other Clinician: Referring Wadsworth Skolnick: Treating Ayasha Ellingsen/Extender: Griselda Miner Weeks in Treatment: 0 Foot Assessment Items Site Locations + = Sensation present, - = Sensation absent, C = Callus, U = Ulcer R = Redness, W = Warmth, M = Maceration, PU = Pre-ulcerative lesion F = Fissure, S = Swelling, D = Dryness Assessment Right: Left: Other Deformity: No No Prior  Foot Ulcer: No No Prior Amputation: No No Charcot Joint: No No Ambulatory Status: Ambulatory Without Help Gait: Steady Electronic Signature(s) Signed: 01/30/2023 1:09:18 PM By: Carlene Coria RN Entered By: Carlene Coria on 01/30/2023 11:50:33 -------------------------------------------------------------------------------- Nutrition Risk Screening Details Patient Name: Date of Service: [name], [name] 01/30/2023 11:30 A M Medical Record Number: [id number] Patient Account Number: [id number] Date of Birth/Sex: Treating RN: Nov 02, 1951 (72 y.o. Lawrence [name] Primary Care Shahzad Thomann: Alma Friendly Other Clinician: Referring Mattheus Rauls: Treating Akili Cuda/Extender: Griselda Miner Weeks in Treatment: 0 Height (in): Weight (lbs): Body Mass Index (BMI): Lawrence [name], Lawrence [name] ([id number]) [phone] [id number] Page 4 of 4 Nutrition Risk Screening Items Score Screening NUTRITION RISK SCREEN: I have an illness or condition that made me change the kind and/or amount of food I eat 0 No I eat fewer than two meals per day 0 No I eat few fruits and vegetables, or milk products 0 No I have three or more drinks of beer, liquor or wine almost every day 0 No I have tooth or mouth problems that make it hard for me to eat 0 No I don't always have enough money to buy the food I need 0 No I eat alone most of the time 0 No I take three or more different prescribed or over-the-counter drugs a day 1 Yes Without wanting to, I have lost or gained 10 pounds in the last six months 0 No I am not always physically able to shop, cook and/or feed myself 0 No Nutrition Protocols Good Risk Protocol 0 No interventions needed Moderate Risk Protocol High Risk Proctocol Risk Level: Good Risk Score: 1 Electronic Signature(s) Signed: 01/30/2023 1:09:18 PM By: Carlene Coria RN Entered By: Carlene Coria on 01/30/2023 11:42:05

## 2023-02-02 ENCOUNTER — Other Ambulatory Visit: Payer: Self-pay | Admitting: Physician Assistant

## 2023-02-02 DIAGNOSIS — E10621 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: Secondary | ICD-10-CM

## 2023-02-06 ENCOUNTER — Encounter: Payer: Medicare Other | Attending: Physician Assistant | Admitting: Physician Assistant

## 2023-02-06 DIAGNOSIS — N18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, stage 3a: Secondary | ICD-10-CM | POA: Insufficient documentation

## 2023-02-06 DIAGNOSIS — L97512 Non-pressure chronic ulcer of other part of right foot with fat layer exposed: Secondary | ICD-10-CM | POA: Diagnosis not present

## 2023-02-06 DIAGNOSIS — E1022 Type 1 diabetes mellitus with diabetic chronic kidney disease: Secondary | ICD-10-CM | POA: Insufficient documentation

## 2023-02-06 DIAGNOSIS — E1051 Type 1 diabetes mellitus with diabetic peripheral angiopathy without gangrene: Secondary | ICD-10-CM | POA: Diagnosis not present

## 2023-02-06 DIAGNOSIS — E10621 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: Secondary | ICD-10-CM | POA: Insufficient documentation

## 2023-02-06 DIAGNOSIS — I129 Hypertensive chronic kidney disease with stage 1 through stage 4 chronic kidney disease, or unspecified chronic kidney disease: Secondary | ICD-10-CM | POA: Insufficient documentation

## 2023-02-06 DIAGNOSIS — M86371 Chronic multifocal osteomyelitis, right ankle and foot: Secondary | ICD-10-CM | POA: Insufficient documentation

## 2023-02-06 DIAGNOSIS — I2510 Atherosclerotic heart disease of native coronary artery without angina pectoris: Secondary | ICD-10-CM | POA: Diagnosis not present

## 2023-02-06 DIAGNOSIS — Z89411 Acquired absence of right great toe: Secondary | ICD-10-CM | POA: Diagnosis not present

## 2023-02-06 DIAGNOSIS — Z89421 Acquired absence of other right toe(s): Secondary | ICD-10-CM | POA: Insufficient documentation

## 2023-02-06 DIAGNOSIS — L97522 Non-pressure chronic ulcer of other part of left foot with fat layer exposed: Secondary | ICD-10-CM | POA: Insufficient documentation

## 2023-02-06 NOTE — Progress Notes (Signed)
Lawrence [name] ([id number]) [id number] Page 1 of 12 Visit Report for 02/06/2023 Arrival Information Details Patient Name: Date of Service: [name], [name] 02/06/2023 11:15 A M Medical Record Number: [id number] Patient Account Number: [id number] Date of Birth/Sex: Treating RN: [birth year] (72 y.o. Lawrence [name] Primary Care Lawrence [name]: Lawrence [name] Other Clinician: Referring Lawrence [name]: Treating Lawrence [name] in Treatment: 1 Visit Information History Since Last Visit Has Dressing in Place as Prescribed: Yes Patient Arrived: Ambulatory Pain Present Now: Yes Arrival Time: 11:27 Accompanied By: spouse Transfer Assistance: None Patient Identification Verified: Yes Secondary Verification Process Completed: Yes Patient Requires Transmission-Based Precautions: No Patient Has Alerts: Yes Patient Alerts: Patient on Blood Thinner DIABETIC TYPE 1 ABI L [place] TBI .68 10/07/22 ABI R Lawrence [name] 10/07/22 Electronic Signature(s) Signed: 02/06/2023 12:46:12 PM By: Lawrence [name] Entered By: Lawrence [name] on 02/06/2023 11:29:53 -------------------------------------------------------------------------------- Clinic Level of Care Assessment Details Patient Name: Date of Service: [name], [name] 02/06/2023 11:15 A M Medical Record Number: [id number] Patient Account Number: [id number] Date of Birth/Sex: Treating RN: Lawrence [name] Primary Care Shade Rivenbark: Lawrence [name] Other Clinician: Referring Lawrence [name]: Treating Lawrence [name] in Treatment: 1 Clinic Level of Care Assessment Items TOOL 4 Quantity Score  - 0 Use when only an EandM is performed on FOLLOW-UP visit ASSESSMENTS - Nursing Assessment / Reassessment X- 1 10 Reassessment of Co-morbidities (includes updates in patient status) X- 1 5 Reassessment of Adherence to Treatment Plan ASSESSMENTS - Wound and Skin A  ssessment / Reassessment  - 0 Simple Wound Assessment / Reassessment - one wound X- 1 5 Complex Wound Assessment / Reassessment - multiple wounds  - 0 Dermatologic / Skin Assessment (not related to wound area) ASSESSMENTS - Focused Assessment X- 1 5 Circumferential Edema Measurements - multi extremities  - 0 Nutritional Assessment / Counseling / Intervention X- 1 5 Lower Extremity Assessment (monofilament, tuning fork, pulses)  - 0 Peripheral Arterial Disease Assessment (using hand held doppler) ASSESSMENTS - Ostomy and/or Continence Assessment and Care  - 0 Incontinence Assessment and Management  - 0 Ostomy Care Assessment and Management (repouching, etc.) PROCESS - Coordination of Care X - Simple Patient / Family Education for ongoing care 1 [address] ([id number]) [id number] Page 2 of 12  - 0 Complex (extensive) Patient / Family Education for ongoing care X- 1 10 Staff obtains Consents, Records, T Results / Process Orders est  - 0 Staff telephones HHA, Nursing Homes / Clarify orders / etc  - 0 Routine Transfer to another Facility (non-emergent condition)  - 0 Routine Hospital Admission (non-emergent condition)  - 0 New Admissions / Manufacturing engineer / Ordering NPWT Apligraf, etc. ,  - 0 Emergency Hospital Admission (emergent condition) X- 1 10 Simple Discharge Coordination  - 0 Complex (extensive) Discharge Coordination PROCESS - Special Needs  - 0 Pediatric / Minor Patient Management  - 0 Isolation Patient Management  - 0 Hearing / Language / Visual special needs  - 0 Assessment of Community assistance (transportation, D/C planning, etc.)  - 0 Additional assistance / Altered mentation  - 0 Support Surface(s) Assessment (bed, cushion, seat, etc.) INTERVENTIONS - Wound Cleansing / Measurement  - 0 Simple Wound Cleansing - one wound X- 1 5 Complex Wound Cleansing - multiple  wounds  - 0 Wound Imaging (photographs - any number of wounds)  - 0 Wound Tracing (instead of photographs)  - 0 Simple Wound Measurement - one  wound []  - 0 Complex Wound Measurement - multiple wounds INTERVENTIONS - Wound Dressings X - Small Wound Dressing one or multiple wounds 1 10 []  - 0 Medium Wound Dressing one or multiple wounds []  - 0 Large Wound Dressing one or multiple wounds []  - 0 Application of Medications - topical []  - 0 Application of Medications - injection INTERVENTIONS - Miscellaneous []  - 0 External ear exam []  - 0 Specimen Collection (cultures, biopsies, blood, body fluids, etc.) []  - 0 Specimen(s) / Culture(s) sent or taken to Lab for analysis []  - 0 Patient Transfer (multiple staff / Nurse, adult / Similar devices) []  - 0 Simple Staple / Suture removal (25 or less) []  - 0 Complex Staple / Suture removal (26 or more) []  - 0 Hypo / Hyperglycemic Management (close monitor of Blood Glucose) []  - 0 Ankle / Brachial Index (ABI) - do not check if billed separately []  - 0 Vital Signs Has the patient been seen at the hospital within the last three years: Yes Total Score: 80 Level Of Care: New/Established - Level 3 Electronic Signature(s) Signed: 02/06/2023 12:46:12 PM By: Lawrence [name] Entered By: Lawrence [name] on 02/06/2023 11:58:48 Lawrence [name] ([id number]) [id number] Page 3 of 12 -------------------------------------------------------------------------------- Encounter Discharge Information Details Patient Name: Date of Service: [name], [name] 02/06/2023 11:15 A M Medical Record Number: [id number] Patient Account Number: [id number] Date of Birth/Sex: Treating RN: 06-20-1951 (72 y.o. Lawrence [name] Primary Care Lawrence [name]: Lawrence [name] Other Clinician: Referring Lawrence [name]: Treating Lawrence [name] in Treatment: 1 Encounter Discharge Information Items Discharge  Condition: Stable Ambulatory Status: Cane Discharge Destination: Home Transportation: Private Auto Accompanied By: spouse Schedule Follow-up Appointment: Yes Clinical Summary of Care: Electronic Signature(s) Signed: 02/06/2023 12:46:12 PM By: Lawrence [name] Entered By: Lawrence [name] on 02/06/2023 12:08:04 -------------------------------------------------------------------------------- Lower Extremity Assessment Details Patient Name: Date of Service: [name], [name] 02/06/2023 11:15 A M Medical Record Number: [id number] Patient Account Number: [id number] Date of Birth/Sex: Treating RN: 1951-05-20 (72 y.o. Lawrence [name] Primary Care Yousif Edelson: Lawrence [name] Other Clinician: Referring Zohar Laing: Treating Robel Wuertz/Extender: Lawrence [name] in Treatment: 1 Edema Assessment Assessed: [Left: No] [Right: No] Edema: [Left: Yes] [Right: Yes] Calf Left: Right: Point of Measurement: 27 cm From Medial Instep 44 cm 42 cm Ankle Left: Right: Point of Measurement: 12 cm From Medial Instep 33.5 cm 33.5 cm Vascular Assessment Pulses: Dorsalis Pedis Palpable: [Left:Yes] [Right:Yes] Electronic Signature(s) Signed: 02/06/2023 12:46:12 PM By: Lawrence [name] Entered By: Lawrence [name] on 02/06/2023 11:43:27 -------------------------------------------------------------------------------- Multi Wound Chart Details Patient Name: Date of Service: [name], [name] 02/06/2023 11:15 A M Medical Record Number: [id number] Patient Account Number: [id number] Date of Birth/Sex: Treating RN: 10-Aug-1951 (72 y.o. Lawrence [name] Primary Care Rowdy Guerrini: Lawrence [name] Other Clinician: Referring Lam Mccubbins: Treating Sheyanne Munley/Extender: Lawrence [name] in Treatment: 1 Leikam, Lawrence [name] ([id number]) [id number] Page 4 of 12 Vital Signs Height(in): Pulse(bpm): 65 Weight(lbs): Blood Pressure(mmHg): 153/77 Body Mass Index(BMI): Temperature(F):  97.8 Respiratory Rate(breaths/min): 20 [1:Photos:] Right T Second oe Right T Third oe Left T Great oe Wound Location: Gradually Appeared Gradually Appeared Gradually Appeared Wounding Event: Diabetic Wound/Ulcer of the Lower Diabetic Wound/Ulcer of the Lower Diabetic Wound/Ulcer of the Lower Primary Etiology: Extremity Extremity Extremity Glaucoma, Coronary Artery Disease, Glaucoma, Coronary Artery Disease, Glaucoma, Coronary Artery Disease, Comorbid History: Hypertension, Myocardial Infarction, Hypertension, Myocardial Infarction, Hypertension, Myocardial Infarction, Type II Diabetes, End Stage Renal Type II Diabetes, End  Stage Renal Type II Diabetes, End Stage Renal Disease Disease Disease 11/03/2022 11/03/2022 12/04/2022 Date Acquired: 1 1 1  Weeks of Treatment: Open Open Open Wound Status: No No No Wound Recurrence: Yes Yes Yes Pending A mputation on Presentation: 1x1x0.2 0.5x0.8x0.3 0.1x0.1x0.1 Measurements L x [name] x D (cm) 0.785 0.314 0.008 A (cm) : rea 0.157 0.094 0.001 Volume (cm) : 0.00% 33.30% 94.90% % Reduction in A rea: 33.50% -100.00% 93.80% % Reduction in Volume: Grade 2 Grade 2 Grade 2 Classification: Medium Medium Medium Exudate A mount: Serosanguineous Serous Serosanguineous Exudate Type: red, brown amber red, brown Exudate Color: No No No Foul Odor A Cleansing: fter N/A N/A N/A Odor A nticipated Due to Product Use: Distinct, outline attached Distinct, outline attached Distinct, outline attached Wound Margin: N/A None Present (0%) None Present (0%) Granulation A mount: N/A Large (67-100%) None Present (0%) Necrotic A mount: Fat Layer (Subcutaneous Tissue): Yes Fat Layer (Subcutaneous Tissue): Yes Fascia: No Exposed Structures: Bone: Yes Fascia: No Fat Layer (Subcutaneous Tissue): No Tendon: No Tendon: No Muscle: No Muscle: No Joint: No Joint: No Bone: No Bone: No None None None Epithelialization: Wound Number: 4 N/A N/A Photos: N/A  N/A Left T Third oe N/A N/A Wound Location: Gradually Appeared N/A N/A Wounding Event: Diabetic Wound/Ulcer of the Lower N/A N/A Primary Etiology: Extremity Glaucoma, Coronary Artery Disease, N/A N/A Comorbid History: Hypertension, Myocardial Infarction, Type II Diabetes, End Stage Renal Disease 12/05/2022 N/A N/A Date Acquired: 1 N/A N/A Weeks of Treatment: Open N/A N/A Wound Status: No N/A N/A Wound Recurrence: Yes N/A N/A Pending A mputation on Presentation: 2x2x0.2 N/A N/A Measurements L x [name] x D (cm) 3.142 N/A N/A A (cm) : Lawrence [name] ([id number]) [id number] Page 5 of 12 0.628 N/A N/A Volume (cm) : -66.70% N/A N/A % Reduction in A rea: -66.60% N/A N/A % Reduction in Volume: Grade 2 N/A N/A Classification: Large N/A N/A Exudate A mount: Serosanguineous N/A N/A Exudate Type: red, brown N/A N/A Exudate Color: Yes N/A N/A Foul Odor A Cleansing: fter No N/A N/A Odor A nticipated Due to Product Use: Distinct, outline attached N/A N/A Wound Margin: None Present (0%) N/A N/A Granulation A mount: Large (67-100%) N/A N/A Necrotic A mount: Fat Layer (Subcutaneous Tissue): Yes N/A N/A Exposed Structures: Bone: Yes Fascia: No Tendon: No Muscle: No Joint: No None N/A N/A Epithelialization: Treatment Notes Electronic Signature(s) Signed: 02/06/2023 12:46:12 PM By: Lawrence [name] Entered By: Lawrence [name] on 02/06/2023 11:46:10 -------------------------------------------------------------------------------- Multi-Disciplinary Care Plan Details Patient Name: Date of Service: [name], [name] 02/06/2023 11:15 A M Medical Record Number: [id number] Patient Account Number: [id number] Date of Birth/Sex: Treating RN: [birth year] (71 y.o. Lawrence [name] Primary Care Corinn Stoltzfus: Lawrence [name] Other Clinician: Referring Adea Geisel: Treating Ever Gustafson/Extender: Lawrence [name] in Treatment: 1 Active  Inactive Necrotic Tissue Nursing Diagnoses: Knowledge deficit related to management of necrotic/devitalized tissue Goals: Necrotic/devitalized tissue will be minimized in the wound bed Date Initiated: 01/30/2023 Target Resolution Date: 03/02/2023 Goal Status: Active Interventions: Assess patient pain level pre-, during and post procedure and prior to discharge Notes: Nutrition Nursing Diagnoses: Potential for alteratiion in Nutrition/Potential for imbalanced nutrition Goals: Patient/caregiver verbalizes understanding of need to maintain therapeutic glucose control per primary care physician Date Initiated: 01/30/2023 Target Resolution Date: 03/02/2023 Goal Status: Active Interventions: Assess HgA1c results as ordered upon admission and as needed Assess patient nutrition upon admission and as needed per policy Notes: Wound/Skin Impairment Nursing Diagnoses: Knowledge deficit related to ulceration/compromised  skin integrity Lawrence ArdDAMS, Lawrence [name] ([id number]) [phone] [id number] Page 6 of 12 Goals: Patient/caregiver will verbalize understanding of skin care regimen Date Initiated: 01/30/2023 Target Resolution Date: 03/02/2023 Goal Status: Active Ulcer/skin breakdown will have a volume reduction of 30% by week 4 Date Initiated: 01/30/2023 Target Resolution Date: 03/02/2023 Goal Status: Active Ulcer/skin breakdown will have a volume reduction of 50% by week 8 Date Initiated: 01/30/2023 Target Resolution Date: 04/01/2023 Goal Status: Active Ulcer/skin breakdown will have a volume reduction of 80% by week 12 Date Initiated: 01/30/2023 Target Resolution Date: 05/02/2023 Goal Status: Active Ulcer/skin breakdown will heal within 14 weeks Date Initiated: 01/30/2023 Target Resolution Date: 06/01/2023 Goal Status: Active Interventions: Assess patient/caregiver ability to obtain necessary supplies Assess patient/caregiver ability to perform ulcer/skin care regimen upon admission  and as needed Assess ulceration(s) every visit Notes: Electronic Signature(s) Signed: 02/06/2023 12:46:12 PM By: Lawrence Publicoulter, [name] Entered By: Lawrence Publicoulter, [name] on 02/06/2023 11:48:48 -------------------------------------------------------------------------------- Pain Assessment Details Patient Name: Date of Service: [name], [name] 02/06/2023 11:15 A M Medical Record Number: [id number] Patient Account Number: [id number] Date of Birth/Sex: Treating RN: 04/16/1951 (72 y.o. Lawrence AbuM) Coulter, [name] Primary Care Jenessa Gillingham: Lawrence Riegerlark, Katherine Other Clinician: Referring Mohmed Farver: Treating Cleburn Maiolo/Extender: Lawrence LouisStone, Hoyt Clark, Katherine Weeks in Treatment: 1 Active Problems Location of Pain Severity and Description of Pain Patient Has Paino Yes Site Locations Pain Management and Medication Current Pain Management: Notes chronic headache Electronic Signature(s) Signed: 02/06/2023 12:46:12 PM By: Lawrence Publicoulter, [name] Entered By: Lawrence Publicoulter, [name] on 02/06/2023 11:29:44 Faria, Lawrence PoloGARLAND [name] ([id number]) [id number] Page 7 of 12 -------------------------------------------------------------------------------- Patient/Caregiver Education Details Patient Name: Date of Service: [name], [name] 4/5/2024andnbsp11:15 A M Medical Record Number: [id number] Patient Account Number: [id number] Date of Birth/Gender: Treating RN: 04/16/1951 (72 y.o. Lawrence AbuM) Coulter, [name] Primary Care Physician: Lawrence Riegerlark, Katherine Other Clinician: Referring Physician: Treating Physician/Extender: Lawrence LouisStone, Hoyt Clark, Katherine Weeks in Treatment: 1 Education Assessment Education Provided To: Patient Education Topics Provided Infection: Handouts: Hygiene and Infection Prevention, Other: doxycycline Methods: Explain/Verbal Responses: State content correctly Wound/Skin Impairment: Handouts: Caring for Your Ulcer Methods: Explain/Verbal Responses: State content correctly Electronic Signature(s) Signed: 02/06/2023 12:46:12  PM By: Lawrence Publicoulter, [name] Entered By: Lawrence Publicoulter, [name] on 02/06/2023 12:28:38 -------------------------------------------------------------------------------- Wound Assessment Details Patient Name: Date of Service: [name], [name] 02/06/2023 11:15 A M Medical Record Number: [id number] Patient Account Number: [id number] Date of Birth/Sex: Treating RN: 04/16/1951 (72 y.o. Lawrence AbuM) Coulter, [name] Primary Care Isabella Ida: Lawrence Riegerlark, Katherine Other Clinician: Referring Calley Drenning: Treating Fusako Tanabe/Extender: Lawrence LouisStone, Hoyt Clark, Katherine Weeks in Treatment: 1 Wound Status Wound Number: 1 Primary Diabetic Wound/Ulcer of the Lower Extremity Etiology: Wound Location: Right T Second oe Wound Open Wounding Event: Gradually Appeared Status: Date Acquired: 11/03/2022 Comorbid Glaucoma, Coronary Artery Disease, Hypertension, Myocardial Weeks Of Treatment: 1 History: Infarction, Type II Diabetes, End Stage Renal Disease Clustered Wound: No Pending Amputation On Presentation Photos Wound Measurements Length: (cm) Width: (cm) Lawrence ArdADAMS, Lawrence [name] ([id number]) Depth: (cm) Area: (cm) Volume: (cm) 1 % Reduction in Area: 0% 1 % Reduction in Volume: 33.5% [id number] Page 8 of 12 0.2 Epithelialization: None 0.785 Tunneling: No 0.157 Undermining: No Wound Description Classification: Grade 2 Wound Margin: Distinct, outline attached Exudate Amount: Medium Exudate Type: Serosanguineous Exudate Color: red, brown Foul Odor After Cleansing: No Slough/Fibrino Yes Wound Bed Necrotic Amount: Large (67-100%) Exposed Structure Necrotic Quality: Adherent Slough Fat Layer (Subcutaneous Tissue) Exposed: Yes Bone Exposed: Yes Treatment Notes Wound #1 (Toe Second) Wound Laterality: Right Cleanser Soap and Water Discharge Instruction: Gently cleanse wound with antibacterial  soap, rinse and pat dry prior to dressing wounds Peri-Wound Care Topical Primary Dressing Silvercel Small 2x2  (in/in) Discharge Instruction: Apply Silvercel Small 2x2 (in/in) as instructed Secondary Dressing Coverlet Latex-Free Fabric Adhesive Dressings Discharge Instruction: 1.5 x 2 Secured With Compression Wrap Compression Stockings Add-Ons Electronic Signature(s) Signed: 02/06/2023 12:46:12 PM By: Lawrence [name] Entered By: Lawrence [name] on 02/06/2023 11:44:43 -------------------------------------------------------------------------------- Wound Assessment Details Patient Name: Date of Service: [name], [name] 02/06/2023 11:15 A M Medical Record Number: [id number] Patient Account Number: [id number] Date of Birth/Sex: Treating RN: 1950-11-18 (72 y.o. Lawrence [name] Primary Care Marlys Stegmaier: Lawrence [name] Other Clinician: Referring Zakaria Sedor: Treating Avontae Burkhead/Extender: Lawrence [name] in Treatment: 1 Wound Status Wound Number: 2 Primary Diabetic Wound/Ulcer of the Lower Extremity Etiology: Wound Location: Right T Third oe Wound Open Wounding Event: Gradually Appeared Status: Date Acquired: 11/03/2022 Comorbid Glaucoma, Coronary Artery Disease, Hypertension, Myocardial Weeks Of Treatment: 1 History: Infarction, Type II Diabetes, End Stage Renal Disease Clustered Wound: No Pending Amputation On Presentation Photos Lawrence [name], Lawrence [name] ([id number]) [id number] Page 9 of 12 Wound Measurements Length: (cm) 0.5 Width: (cm) 0.8 Depth: (cm) 0.3 Area: (cm) 0.314 Volume: (cm) 0.094 % Reduction in Area: 33.3% % Reduction in Volume: -100% Epithelialization: None Tunneling: No Undermining: No Wound Description Classification: Grade 2 Wound Margin: Distinct, outline attached Exudate Amount: Medium Exudate Type: Serous Exudate Color: amber Foul Odor After Cleansing: No Slough/Fibrino Yes Wound Bed Granulation Amount: None Present (0%) Exposed Structure Necrotic Amount: Large (67-100%) Fascia Exposed: No Necrotic Quality: Adherent  Slough Fat Layer (Subcutaneous Tissue) Exposed: Yes Tendon Exposed: No Muscle Exposed: No Joint Exposed: No Bone Exposed: No Treatment Notes Wound #2 (Toe Third) Wound Laterality: Right Cleanser Soap and Water Discharge Instruction: Gently cleanse wound with antibacterial soap, rinse and pat dry prior to dressing wounds Peri-Wound Care Topical Primary Dressing Silvercel Small 2x2 (in/in) Discharge Instruction: Apply Silvercel Small 2x2 (in/in) as instructed Secondary Dressing Coverlet Latex-Free Fabric Adhesive Dressings Discharge Instruction: 1.5 x 2 Secured With Compression Wrap Compression Stockings Add-Ons Electronic Signature(s) Signed: 02/06/2023 12:46:12 PM By: Lawrence [name] Entered By: Lawrence [name] on 02/06/2023 11:45:12 -------------------------------------------------------------------------------- Wound Assessment Details Patient Name: Date of Service: [name], [name] 02/06/2023 11:15 A M Medical Record Number: [id number] Patient Account Number: [id number] Lawrence [name], Lawrence [name] ([id number]) [phone].pdf Page 10 of 12 Date of Birth/Sex: Treating RN: February 26, 1951 (72 y.o. Lawrence [name] Primary Care Dalores Weger: Other Clinician: Vernona [name] Referring Creighton Longley: Treating Nesa Distel/Extender: Lawrence [name] in Treatment: 1 Wound Status Wound Number: 3 Primary Diabetic Wound/Ulcer of the Lower Extremity Etiology: Wound Location: Left T Great oe Wound Healed - Epithelialized Wounding Event: Gradually Appeared Status: Date Acquired: 12/04/2022 Comorbid Glaucoma, Coronary Artery Disease, Hypertension, Myocardial Weeks Of Treatment: 1 History: Infarction, Type II Diabetes, End Stage Renal Disease Clustered Wound: No Pending Amputation On Presentation Photos Wound Measurements Length: (cm) Width: (cm) Depth: (cm) Area: (cm) Volume: (cm) 0 % Reduction in Area: 100% 0 % Reduction in Volume: 100% 0 Epithelialization:  Large (67-100%) 0 Tunneling: No 0 Undermining: No Wound Description Classification: Grade 2 Wound Margin: Distinct, outline attached Exudate Amount: None Present Foul Odor After Cleansing: No Slough/Fibrino No Wound Bed Granulation Amount: None Present (0%) Exposed Structure Necrotic Amount: None Present (0%) Fascia Exposed: No Fat Layer (Subcutaneous Tissue) Exposed: No Tendon Exposed: No Muscle Exposed: No Joint Exposed: No Bone Exposed: No Treatment Notes Wound #3 (Toe Great) Wound Laterality: Left Cleanser Peri-Wound Care Topical Primary Dressing Secondary  Dressing Secured With Compression Wrap Compression Stockings Add-Ons Electronic Signature(s) Signed: 02/06/2023 12:46:12 PM By: Lawrence [name] Entered By: Lawrence [name] on 02/06/2023 11:56:23 Hartlage, Lawrence [name] ([id number]) [id number] Page 11 of 12 -------------------------------------------------------------------------------- Wound Assessment Details Patient Name: Date of Service: [name], [name] 02/06/2023 11:15 A M Medical Record Number: [id number] Patient Account Number: [id number] Date of Birth/Sex: Treating RN: Lawrence 18, 1952 (72 y.o. Lawrence [name] Primary Care Jisella Ashenfelter: Lawrence [name] Other Clinician: Referring Jennifer Payes: Treating Lateka Rady/Extender: Lawrence [name] in Treatment: 1 Wound Status Wound Number: 4 Primary Diabetic Wound/Ulcer of the Lower Extremity Etiology: Wound Location: Left T Third oe Wound Open Wounding Event: Gradually Appeared Status: Date Acquired: 12/05/2022 Comorbid Glaucoma, Coronary Artery Disease, Hypertension, Myocardial Weeks Of Treatment: 1 History: Infarction, Type II Diabetes, End Stage Renal Disease Clustered Wound: No Pending Amputation On Presentation Photos Wound Measurements Length: (cm) 2 Width: (cm) 2 Depth: (cm) 0.2 Area: (cm) 3.142 Volume: (cm) 0.628 % Reduction in Area: -66.7% % Reduction in Volume:  -66.6% Epithelialization: None Tunneling: No Undermining: No Wound Description Classification: Grade 2 Wound Margin: Distinct, outline attached Exudate Amount: Large Exudate Type: Serosanguineous Exudate Color: red, brown Foul Odor After Cleansing: Yes Due to Product Use: No Slough/Fibrino Yes Wound Bed Granulation Amount: None Present (0%) Exposed Structure Necrotic Amount: Large (67-100%) Fascia Exposed: No Necrotic Quality: Adherent Slough Fat Layer (Subcutaneous Tissue) Exposed: Yes Tendon Exposed: No Muscle Exposed: No Joint Exposed: No Bone Exposed: No Treatment Notes Wound #4 (Toe Third) Wound Laterality: Left Cleanser Soap and Water Discharge Instruction: Gently cleanse wound with antibacterial soap, rinse and pat dry prior to dressing wounds Peri-Wound Care Topical Primary Dressing Silvercel Small 2x2 (in/in) Discharge Instruction: Apply Silvercel Small 2x2 (in/in) as instructed Secondary Dressing Lawrence [name] ([id number]) [id number] Page 12 of 12 Coverlet Latex-Free Fabric Adhesive Dressings Discharge Instruction: 1.5 x 2 Secured With Compression Wrap Compression Stockings Add-Ons Electronic Signature(s) Signed: 02/06/2023 12:46:12 PM By: Lawrence [name] Entered By: Lawrence [name] on 02/06/2023 11:55:04 -------------------------------------------------------------------------------- Vitals Details Patient Name: Date of Service: [name], [name] 02/06/2023 11:15 A M Medical Record Number: [id number] Patient Account Number: [id number] Date of Birth/Sex: Treating RN: 11/20/1950 (72 y.o. Lawrence [name] Primary Care Konstantinos Cordoba: Lawrence [name] Other Clinician: Referring Yang Rack: Treating [place]/Extender: Lawrence [name] in Treatment: 1 Vital Signs Time Taken: 11:28 Temperature (F): 97.8 Pulse (bpm): 65 Respiratory Rate (breaths/min): 20 Blood Pressure (mmHg): 153/77 Reference Range: 80 - 120 mg /  dl Electronic Signature(s) Signed: 02/06/2023 12:46:12 PM By: Lawrence [name] Entered By: Lawrence [name] on 02/06/2023 11:29:20

## 2023-02-06 NOTE — Progress Notes (Addendum)
Lawrence [name] ([id number]) [id number] Page 1 of 6 Visit Report for 02/06/2023 Chief Complaint Document Details Patient Name: Date of Service: [name], [name] 02/06/2023 11:15 A M Medical Record Number: [id number] Patient Account Number: [id number] Date of Birth/Sex: Treating RN: 1951-01-29 (72 y.o. Lawrence [name] Primary Care Provider: [name] Other Clinician: Referring Provider: Treating Provider/Extender: Lawrence [name] in [name]: 1 Information Obtained from: Patient Chief Complaint Bilateral [name] ulcers Electronic Signature(s) Signed: 02/06/2023 11:51:30 AM By: Lawrence Kelp PA-C Entered By: Lawrence [name] on 02/06/2023 11:51:30 -------------------------------------------------------------------------------- HPI Details Patient Name: Date of Service: [name], [name] 02/06/2023 11:15 A M Medical Record Number: [id number] Patient Account Number: [id number] Date of Birth/Sex: Treating RN: 08-15-1951 (72 y.o. Lawrence [name] Primary Care Provider: [name] Other Clinician: Referring Provider: Treating Provider/Extender: Lawrence [name] in [name]: 1 History of Present Illness HPI Description: 01-30-2023 upon evaluation today patient appears to be doing poorly currently in regard to his feet bilaterally. He is actually referral from Dr. Gala [name] who is a local podiatrist. Subsequently Dr. Logan [name] has been seeing him but wanted to refer him to [place] due to the fact that he is having difficulty healing in regard to his feet bilaterally. He has had several amputations. 1 back in the summer 2023 of the great toe right [name] Subsequently had a partial digit amputation of the second toe that was performed in November 2023. Subsequently since that time he had a hard time getting this to heal. Fortunately there does not appear to be any signs of infection has been using gentamicin currently as the  [name] of choice. With that being said I feel like that we need to try to see about drying some of these wounds off of it which is my biggest concern at this point. Also think that he could potentially have osteomyelitis of this right [name] he has not had an x-ray of the left [name] Lawrence [name] and x-ray left [name] he is already had an x-ray of the right [name] at Dr. Logan [name] office and that did not reveal obvious signs of osteomyelitis but he does have bone exposure in the distal portion of the second toe right [name] digit. Patient does have a history significant for diabetes mellitus type 1, hypertension, peripheral vascular disease, chronic kidney disease stage IIIa. He also has undergone arterial study which was actually performed on 12-08-2021 and this shows that he actually has good arterial flow into the right lower extremity and left lower extremity for that matter. He had noncompressible ABIs but on the left he had a TBI of 0.68 and in regard to his flow it was actually better on the right even compared to the left with good pressures at all locations and again there does not appear to be any significant peripheral vascular disease that would prevent healing. 02-06-2023 upon evaluation today patient appears to be doing about the same in regard to his wounds. There is quite a bit of maceration of the great toe on the right [name] does appear to be healed the remainder of the wounds all appear to be a little bit macerated. I think that he may need to change this dressing a little bit more frequently. We have been using a silver alginate dressing. Electronic Signature(s) Signed: 02/06/2023 1:10:56 PM By: Lawrence Kelp PA-C Entered By: Lawrence [name] on 02/06/2023 13:10:56 -------------------------------------------------------------------------------- Physical Exam Details Patient Name: Date  of Service: [name], [name] 02/06/2023 11:15 A M Medical Record Number: [id number] Patient  Account Number: [id number] Date of Birth/Sex: Treating RN: 12/16/1950 (72 y.o. Lawrence AbuM) Lawrence [name] Primary Care Provider: Vernona Riegerlark, [name] Other Clinician: Referring Provider: Treating Provider/Extender: Lawrence LouisStone, Tayah [name] Lawrence [name]: 1 Constitutional [name], [name] ([id number]) [id number] Page 2 of 6 Well-nourished and well-hydrated in no acute distress. Respiratory normal breathing without difficulty. Psychiatric this patient is able to make decisions and demonstrates good insight into disease process. Alert and Oriented x 3. pleasant and cooperative. Notes Upon inspection patient's wound bed actually showed signs of good granulation epithelization at this point. Fortunately I do not see any evidence of infection systemically though locally I am still concerned about the possibility of osteomyelitis in this right [name] and the patient does have his MRI on Monday. On the left the third toe does have been a little concerned as well but again right now her problem primarily focusing on the right [name] and then we will see how the left goes. Electronic Signature(s) Signed: 02/06/2023 1:11:32 PM By: Lawrence KelpStone III, Lawrence Berggren PA-C Entered By: Lawrence KelpStone III, Sharif Rendell on 02/06/2023 13:11:32 -------------------------------------------------------------------------------- Physician Orders Details Patient Name: Date of Service: [name], [name] 02/06/2023 11:15 A M Medical Record Number: [id number] Patient Account Number: [id number] Date of Birth/Sex: Treating RN: 12/16/1950 (72 y.o. 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 Anesthetic (Use 'Patient Medications' Section for Anesthetic Order Entry) Lidocaine applied to wound bed Edema Control - Lymphedema / Segmental Compressive Device / Other Elevate, Exercise Daily and A void Standing for Long Periods of Time. Elevate legs to the level of the heart and pump ankles as often as possible Elevate leg(s) parallel to the floor when sitting. Off-Loading Open toe surgical shoe - right and left [name] Wound [name] Wound #1 - T Second oe Wound Laterality: Right Cleanser: Soap and Water 1 x Per Day/30 Days Discharge Instructions: Gently cleanse wound with antibacterial soap, rinse and pat dry prior to dressing wounds Prim Dressing: Silvercel Small 2x2 (in/in) 1 x Per Day/30 Days ary Discharge Instructions: Apply Silvercel Small 2x2 (in/in) as instructed Secondary Dressing: Coverlet Latex-Free Fabric Adhesive Dressings 1 x Per Day/30 Days Discharge Instructions: 1.5 x 2 Wound #2 - T Third oe Wound Laterality: Right Cleanser: Soap and Water 1 x Per Day/30 Days Discharge Instructions: Gently cleanse wound with antibacterial soap, rinse and pat dry prior to dressing wounds Prim Dressing: Silvercel Small 2x2 (in/in) 1 x Per Day/30 Days ary Discharge Instructions: Apply Silvercel Small 2x2 (in/in) as instructed Lawrence ArdADAMS, Lawrence [name] ([id number]) [id number] Page 3 of 6 Secondary Dressing: Coverlet Latex-Free Fabric Adhesive Dressings 1 x Per Day/30 Days Discharge Instructions: 1.5 x 2 Wound #4 - T Third oe Wound Laterality: Left Cleanser: Soap and Water 1 x Per Day/30 Days Discharge  Instructions: Gently cleanse wound with antibacterial soap, rinse and pat dry prior to dressing wounds  Prim Dressing: Silvercel Small 2x2 (in/in) 1 x Per Day/30 Days ary Discharge Instructions: Apply Silvercel Small 2x2 (in/in) as instructed Secondary Dressing: Coverlet Latex-Free Fabric Adhesive Dressings 1 x Per Day/30 Days Discharge Instructions: 1.5 x 2 Electronic Signature(s) Signed: 02/06/2023 12:23:32 PM By: Lawrence Kelp PA-C Signed: 02/06/2023 12:46:12 PM By: Bonnell Public Entered By: Bonnell Public on 02/06/2023 11:57:59 -------------------------------------------------------------------------------- Problem List Details Patient Name: Date of Service: [name], [name] 02/06/2023 11:15 A M Medical Record Number: [id number] Patient Account Number: [id number] Date of Birth/Sex: Treating RN: Feb 23, 1951 (72 y.o. 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 01/30/2023 No Yes I25.10 Atherosclerotic heart disease of native coronary artery without angina pectoris 01/30/2023 No Yes I10 Essential (primary) hypertension 01/30/2023 No Yes I73.89 Other specified peripheral vascular diseases 01/30/2023 No Yes N18.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, stage 3a 01/30/2023 No Yes Inactive Problems Resolved Problems Electronic Signature(s) Signed: 02/06/2023 11:51:27 AM By: Teodoro Spray ([id number]) By: Lawrence Kelp PA-C [phone].pdf Page 4 of 6 Signed: 02/06/2023 11:51:27 AM Entered By: Lawrence [name]  on 02/06/2023 11:51:26 -------------------------------------------------------------------------------- Progress Note Details Patient Name: Date of Service: [name], [name] 02/06/2023 11:15 A M Medical Record Number: [id number] Patient Account Number: [id number] Date of Birth/Sex: Treating RN: December 27, 1950 (72 y.o. Lawrence [name] Primary Care Provider: [name] Other Clinician: Referring Provider: Treating Provider/Extender: Lawrence [name] in [name]: 1 Subjective Chief Complaint Information obtained from Patient Bilateral [name] ulcers History of Present Illness (HPI) 01-30-2023 upon evaluation today patient appears to be doing poorly currently in regard to his feet bilaterally. He is actually referral from Dr. Gala [name] who is a local podiatrist. Subsequently Dr. Logan [name] has been seeing him but wanted to refer him to [place] due to the fact that he is having difficulty healing in regard to his feet bilaterally. He has had several amputations. 1 back in the summer 2023 of the great toe right [name] Subsequently had a partial digit amputation of the second toe that was performed in November 2023. Subsequently since that time he had a hard time getting this to heal. Fortunately there does not appear to be any signs of infection has been using gentamicin currently as the [name] of choice. With that being said I feel like that we need to try to see about drying some of these wounds off of it which is my biggest concern at this point. Also think that he could potentially have osteomyelitis of this right [name] he has not had an x-ray of the left [name] Lawrence [name] and x-ray left [name] he is already had an x-ray of the right [name] at Dr. Logan [name] office and that did not reveal obvious signs of osteomyelitis but he does have bone exposure in the distal portion of the second toe right [name] digit. Patient does have a history significant for diabetes mellitus type  1, hypertension, peripheral vascular disease, chronic kidney disease stage IIIa. He also has undergone arterial study which was actually performed on 12-08-2021 and this shows that he actually has good  arterial flow into the right lower extremity and left lower extremity for that matter. He had noncompressible ABIs but on the left he had a TBI of 0.68 and in regard to his flow it was actually better on the right even compared to the left with good pressures at all locations and again there does not appear to be any significant peripheral vascular disease that would prevent healing. 02-06-2023 upon evaluation today patient appears to be doing about the same in regard to his wounds. There is quite a bit of maceration of the great toe on the right [name] does appear to be healed the remainder of the wounds all appear to be a little bit macerated. I think that he may need to change this dressing a little bit more frequently. We have been using a silver alginate dressing. Objective Constitutional Well-nourished and well-hydrated in no acute distress. Vitals Time Taken: 11:28 AM, Temperature: 97.8 F, Pulse: 65 bpm, Respiratory Rate: 20 breaths/min, Blood Pressure: 153/77 mmHg. Respiratory normal breathing without difficulty. Psychiatric this patient is able to make decisions and demonstrates good insight into disease process. Alert and Oriented x 3. pleasant and cooperative. General Notes: Upon inspection patient's wound bed actually showed signs of good granulation epithelization at this point. Fortunately I do not see any evidence of infection systemically though locally I am still concerned about the possibility of osteomyelitis in this right [name] and the patient does have his MRI on Monday. On the left the third toe does have been a little concerned as well but again right now her problem primarily focusing on the right [name] and then we will see how the left goes. Integumentary (Hair, Skin) Wound #1  status is Open. Original cause of wound was Gradually Appeared. The date acquired was: 11/03/2022. The wound has been in [name] 1 weeks. The wound is located on the Right T Second. The wound measures 1cm length x 1cm width x 0.2cm depth; 0.785cm^2 area and 0.157cm^3 volume. There is bone oe and Fat Layer (Subcutaneous Tissue) exposed. There is no tunneling or undermining noted. There is a medium amount of serosanguineous drainage noted. The wound margin is distinct with the outline attached to the wound base. There is a large (67-100%) amount of necrotic tissue within the wound bed including Adherent Slough. Wound #2 status is Open. Original cause of wound was Gradually Appeared. The date acquired was: 11/03/2022. The wound has been in [name] 1 weeks. The wound is located on the Right T Third. The wound measures 0.5cm length x 0.8cm width x 0.3cm depth; 0.314cm^2 area and 0.094cm^3 volume. There is Fat oe Layer (Subcutaneous Tissue) exposed. There is no tunneling or undermining noted. There is a medium amount of serous drainage noted. The wound margin is distinct with the outline attached to the wound base. There is no granulation within the wound bed. There is a large (67-100%) amount of necrotic tissue within the wound bed including Adherent Slough. Wound #3 status is Healed - Epithelialized. Original cause of wound was Gradually Appeared. The date acquired was: 12/04/2022. The wound has been in [name] 1 weeks. The wound is located on the Left T Great. The wound measures 0cm length x 0cm width x 0cm depth; 0cm^2 area and 0cm^3 volume. oe There is no tunneling or undermining noted. There is a none present amount of drainage noted. The wound margin is distinct with the outline attached to the wound base. There is no granulation within the wound bed. There is no necrotic tissue within  the wound bed. Lawrence [name] ([id number]) [id number] Page 5 of 6 Wound #4 status  is Open. Original cause of wound was Gradually Appeared. The date acquired was: 12/05/2022. The wound has been in [name] 1 weeks. The wound is located on the Left T Third. The wound measures 2cm length x 2cm width x 0.2cm depth; 3.142cm^2 area and 0.628cm^3 volume. There is Fat oe Layer (Subcutaneous Tissue) exposed. There is no tunneling or undermining noted. There is a large amount of serosanguineous drainage noted. Foul odor after cleansing was noted. The wound margin is distinct with the outline attached to the wound base. There is no granulation within the wound bed. There is a large (67-100%) amount of necrotic tissue within the wound bed including Adherent Slough. 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 Anesthetic (Use 'Patient Medications' Section for Anesthetic Order Entry): Lidocaine applied to wound bed Edema Control - Lymphedema / Segmental Compressive Device / Other: Elevate, Exercise Daily and Avoid Standing for Long Periods of Time. Elevate legs to the level of the heart and pump ankles as often as possible Elevate leg(s) parallel to the floor when sitting. Off-Loading: Open toe surgical shoe - right and left [name] WOUND #1: - T Second Wound Laterality: Right oe Cleanser: Soap and Water 1 x Per Day/30 Days Discharge Instructions: Gently cleanse wound with antibacterial soap, rinse and pat dry prior to dressing wounds Prim Dressing: Silvercel Small 2x2 (in/in) 1 x Per Day/30 Days ary Discharge Instructions: Apply Silvercel Small 2x2  (in/in) as instructed Secondary Dressing: Coverlet Latex-Free Fabric Adhesive Dressings 1 x Per Day/30 Days Discharge Instructions: 1.5 x 2 WOUND #2: - T Third Wound Laterality: Right oe Cleanser: Soap and Water 1 x Per Day/30 Days Discharge Instructions: Gently cleanse wound with antibacterial soap, rinse and pat dry prior to dressing wounds Prim Dressing: Silvercel Small 2x2 (in/in) 1 x Per Day/30 Days ary Discharge Instructions: Apply Silvercel Small 2x2 (in/in) as instructed Secondary Dressing: Coverlet Latex-Free Fabric Adhesive Dressings 1 x Per Day/30 Days Discharge Instructions: 1.5 x 2 WOUND #4: - T Third Wound Laterality: Left oe Cleanser: Soap and Water 1 x Per Day/30 Days Discharge Instructions: Gently cleanse wound with antibacterial soap, rinse and pat dry prior to dressing wounds Prim Dressing: Silvercel Small 2x2 (in/in) 1 x Per Day/30 Days ary Discharge Instructions: Apply Silvercel Small 2x2 (in/in) as instructed Secondary Dressing: Coverlet Latex-Free Fabric Adhesive Dressings 1 x Per Day/30 Days Discharge Instructions: 1.5 x 2 1. Based on what I am seeing I do believe that the patient is making good progress here. He has 1 toe on the great toe right [name] that is healed this is great news. With that being said I do believe that he is doing well in general. 2. I am going to suggest as well that we continue with the silver alginate dressing which I think is still doing quite well at this point we have changed daily though however that should keep things from getting too wet. We will see patient back for reevaluation in 1 week here in the clinic. If anything worsens or changes patient will contact our office for additional recommendations.  Electronic Signature(s) Signed: 02/06/2023 1:12:03 PM By: Lawrence Kelp PA-C Entered By: Lawrence [name] on 02/06/2023 13:12:03 -------------------------------------------------------------------------------- SuperBill  Details Patient Name: Date of Service: [name], [name] 02/06/2023 Medical Record Number: [id number] Patient Account Number: [id number] Date of Birth/Sex: Treating RN: 1951-09-07 (72 y.o. 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

## 2023-02-09 ENCOUNTER — Ambulatory Visit
Admission: RE | Admit: 2023-02-09 | Discharge: 2023-02-09 | Disposition: A | Discharge status: Home / Self Care | Payer: Medicare Other | Source: Ambulatory Visit | Attending: Physician Assistant | Admitting: Physician Assistant

## 2023-02-09 DIAGNOSIS — E10621 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: Secondary | ICD-10-CM | POA: Diagnosis present

## 2023-02-09 DIAGNOSIS — L97509 Non-pressure chronic ulcer of other part of unspecified foot with unspecified severity: Secondary | ICD-10-CM | POA: Insufficient documentation

## 2023-02-09 MED ORDER — GADOBUTROL 1 MMOL/ML IV SOLN
10.0000 mL | Freq: Once | INTRAVENOUS | Status: AC | PRN
  Administered 2023-02-09: 10 mL via INTRAVENOUS

## 2023-02-13 ENCOUNTER — Encounter: Payer: Medicare Other | Admitting: Physician Assistant

## 2023-02-13 ENCOUNTER — Other Ambulatory Visit: Payer: Self-pay | Admitting: Physician Assistant

## 2023-02-13 ENCOUNTER — Ambulatory Visit
Admission: RE | Admit: 2023-02-13 | Discharge: 2023-02-13 | Disposition: A | Discharge status: Home / Self Care | Payer: Medicare Other | Source: Ambulatory Visit | Attending: Physician Assistant | Admitting: Physician Assistant

## 2023-02-13 ENCOUNTER — Other Ambulatory Visit
Admission: RE | Admit: 2023-02-13 | Discharge: 2023-02-13 | Disposition: A | Discharge status: Home / Self Care | Payer: Medicare Other | Source: Ambulatory Visit | Attending: Physician Assistant | Admitting: Physician Assistant

## 2023-02-13 DIAGNOSIS — T148XXA Other injury of unspecified body region, initial encounter: Secondary | ICD-10-CM | POA: Diagnosis present

## 2023-02-13 DIAGNOSIS — L089 Local infection of the skin and subcutaneous tissue, unspecified: Secondary | ICD-10-CM | POA: Insufficient documentation

## 2023-02-13 LAB — CBC WITH DIFFERENTIAL/PLATELET
Abs Immature Granulocytes: 0.02 K/uL (ref 0.00–0.07)
Basophils Absolute: 0 K/uL (ref 0.0–0.1)
Basophils Relative: 1 %
Eosinophils Absolute: 0.2 K/uL (ref 0.0–0.5)
Eosinophils Relative: 2 %
HCT: 39.4 % (ref 39.0–52.0)
Hemoglobin: 13 g/dL (ref 13.0–17.0)
Immature Granulocytes: 0 %
Lymphocytes Relative: 26 %
Lymphs Abs: 2 K/uL (ref 0.7–4.0)
MCH: 31.6 pg (ref 26.0–34.0)
MCHC: 33 g/dL (ref 30.0–36.0)
MCV: 95.6 fL (ref 80.0–100.0)
Monocytes Absolute: 0.6 K/uL (ref 0.1–1.0)
Monocytes Relative: 8 %
Neutro Abs: 4.8 K/uL (ref 1.7–7.7)
Neutrophils Relative %: 63 %
Platelets: 233 K/uL (ref 150–400)
RBC: 4.12 MIL/uL — ABNORMAL LOW (ref 4.22–5.81)
RDW: 12.5 % (ref 11.5–15.5)
WBC: 7.6 K/uL (ref 4.0–10.5)
nRBC: 0 % (ref 0.0–0.2)

## 2023-02-13 LAB — SEDIMENTATION RATE: Sed Rate: 51 mm/hr — ABNORMAL HIGH (ref 0–20)

## 2023-02-13 LAB — C-REACTIVE PROTEIN: CRP: 0.6 mg/dL (ref <1.0)

## 2023-02-13 NOTE — Progress Notes (Addendum)
Lawrence [name], Lawrence [name] ([id number]) [id number] Page 1 of 8 Visit Report for 02/13/2023 Chief Complaint Document Details Patient Name: Date of Service: [name], [name] 02/13/2023 9:00 A M Medical Record Number: [id number] Patient Account Number: [id number] Date of Birth/Sex: Treating RN: 02/20/1951 (72 y.o. [name]) [name] Primary Care Provider: [name] Other Clinician: Referring Provider: Treating Provider/Extender: [name] in Treatment: 2 Information Obtained from: Patient Chief Complaint Bilateral foot ulcers Electronic Signature(s) Signed: 02/13/2023 9:18:47 AM By: [name] Entered By: [name] on 02/13/2023 09:18:47 -------------------------------------------------------------------------------- HPI Details Patient Name: Date of Service: [name], [name] 02/13/2023 9:00 A M Medical Record Number: [id number] Patient Account Number: [id number] Date of Birth/Sex: Treating RN: 1951/05/15 (72 y.o. Lawrence [name] Primary Care Provider: [name] Other Clinician: Referring Provider: Treating Provider/Extender: [name] in Treatment: 2 History of Present Illness HPI Description: 01-30-2023 upon evaluation today patient appears to be doing poorly currently in regard to his feet bilaterally. He is actually referral from Dr. Gala Lewandowsky who is a local podiatrist. Subsequently Dr. Logan Bores has been seeing him but wanted to refer him to [place] due to the fact that he is having difficulty healing in regard to his feet bilaterally. He has had several amputations. 1 back in the summer 2023 of the great toe right foot. Subsequently had a partial digit amputation of the second toe that was performed in November 2023. Subsequently since that time he had a hard time getting this to heal. Fortunately there does not appear to be any signs of infection has been using gentamicin currently as the  treatment of choice. With that being said I feel like that we need to try to see about drying some of these wounds off of it which is my biggest concern at this point. Also think that he could potentially have osteomyelitis of this right foot he has not had an x-ray of the left foot Emina suggest MRI right foot and x-ray left foot he is already had an x-ray of the right foot at Dr. Logan Bores office and that did not reveal obvious signs of osteomyelitis but he does have bone exposure in the distal portion of the second toe right foot digit. Patient does have a history significant for diabetes mellitus type 1, hypertension, peripheral vascular disease, chronic kidney disease stage IIIa. He also has undergone arterial study which was actually performed on 12-08-2021 and this shows that he actually has good arterial flow into the right lower extremity and left lower extremity for that matter. He had noncompressible ABIs but on the left he had a TBI of 0.68 and in regard to his flow it was actually better on the right even compared to the left with good pressures at all locations and again there does not appear to be any significant peripheral vascular disease that would prevent healing. 02-06-2023 upon evaluation today patient appears to be doing about the same in regard to his wounds. There is quite a bit of maceration of the great toe on the right foot does appear to be healed the remainder of the wounds all appear to be a little bit macerated. I think that he may need to change this dressing a little bit more frequently. We have been using a silver alginate dressing. 02-13-2023 upon evaluation today patient unfortunately is continuing to have some issues here with purulent drainage from his toes there is also an odor that both his wife  and the nurse noted although I was not able to detect this due to my lack of smell. Nonetheless I do believe that this is something where we need to be a little more aggressive  than the treatment of the wound to be perfectly honest. I discussed this with the patient today and he is in agreement with this plan. I am going to send in those antibiotics for him today. Electronic Signature(s) Signed: 02/13/2023 9:32:59 AM By: [name] Entered By: [name] on 02/13/2023 09:32:58 -------------------------------------------------------------------------------- Physical Exam Details Patient Name: Date of Service: [name], [name] 02/13/2023 9:00 A M Medical Record Number: [id number] Patient Account Number: [id number] Date of Birth/Sex: Treating RN: 02/17/1951 (72 y.o. [name]) [name] Primary Care Provider: [name] Other Clinician: Referring Provider: Treating Provider/Extender: Parks Neptune ([id number]) [phone].pdf Page 2 of 8 Weeks in Treatment: 2 Constitutional Well-nourished and well-hydrated in no acute distress. Respiratory normal breathing without difficulty. Psychiatric this patient is able to make decisions and demonstrates good insight into disease process. Alert and Oriented x 3. pleasant and cooperative. Notes Upon inspection patient's wound bed actually showed signs of poor granulation at this point. Unfortunately I think that he is going require an additional antibiotic therapy is already been on antibiotics prior to seeing me when he was being followed by podiatry. That was before his referral back towards [place]. Nonetheless right now I do think that he is going to require some additional and stronger antibiotic therapy and he is in agreement with this plan. Prior to me placing him on the doxycycline he was on Augmentin Augmentin which began on December 22, 2022 and prior to that he was on straight amoxicillin on November 24, 2022. He therefore has been on antibiotic therapy prior to seeing me as ordered by other physicians for at least 30 days prior to seeing me. Electronic  Signature(s) Signed: 02/13/2023 9:34:53 AM By: [name] Entered By: [name] on 02/13/2023 09:34:53 -------------------------------------------------------------------------------- Physician Orders Details Patient Name: Date of Service: [name], [name] 02/13/2023 9:00 A M Medical Record Number: [id number] Patient Account Number: [id number] Date of Birth/Sex: Treating RN: 02/04/1951 (72 y.o. [name]) [name] Primary Care Provider: [name] Other Clinician: Referring Provider: Treating Provider/Extender: [name] in Treatment: 2 Verbal / Phone Orders: No Diagnosis Coding ICD-10 Coding Code Description M86.371 Chronic multifocal osteomyelitis, right ankle and foot E10.621 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 I25.10 Atherosclerotic heart disease of native coronary artery without angina pectoris I10 Essential (primary) hypertension I73.89 Other specified peripheral vascular diseases N18.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, stage 3a Follow-up Appointments Return Appointment in 1 week. Anesthetic (Use 'Patient Medications' Section for Anesthetic Order Entry) Lidocaine applied to wound bed Edema Control - Lymphedema / Segmental Compressive Device / Other Elevate, Exercise Daily and A void Standing for Long Periods of Time. Elevate legs to the level of the heart and pump ankles as often as possible Elevate leg(s) parallel to the floor when sitting. Off-Loading Open toe surgical shoe - right and left foot Wound Treatment Wound #1 - T Second oe Wound Laterality: Right Cleanser: Soap and Water 1 x Per Day/30 Days Discharge Instructions: Gently cleanse wound with antibacterial soap, rinse and pat dry prior to dressing wounds Prim Dressing: Silvercel Small  2x2 (in/in) 1 x Per  Day/30 Days ary Discharge Instructions: Apply Silvercel Small 2x2 (in/in) as instructed Secondary Dressing: Coverlet Latex-Free Fabric Adhesive Dressings 1 x Per Day/30 Days Discharge Instructions: 1.5 x 2 Lawrence [name], Lawrence [name] ([id number]) [id number] Page 3 of 8 Wound #2 - T Third oe Wound Laterality: Right Cleanser: Soap and Water 1 x Per Day/30 Days Discharge Instructions: Gently cleanse wound with antibacterial soap, rinse and pat dry prior to dressing wounds Prim Dressing: Silvercel Small 2x2 (in/in) 1 x Per Day/30 Days ary Discharge Instructions: Apply Silvercel Small 2x2 (in/in) as instructed Secondary Dressing: Coverlet Latex-Free Fabric Adhesive Dressings 1 x Per Day/30 Days Discharge Instructions: 1.5 x 2 Wound #4 - T Third oe Wound Laterality: Left Cleanser: Soap and Water 1 x Per Day/30 Days Discharge Instructions: Gently cleanse wound with antibacterial soap, rinse and pat dry prior to dressing wounds Prim Dressing: Silvercel Small 2x2 (in/in) 1 x Per Day/30 Days ary Discharge Instructions: Apply Silvercel Small 2x2 (in/in) as instructed Secondary Dressing: Coverlet Latex-Free Fabric Adhesive Dressings 1 x Per Day/30 Days Discharge Instructions: 1.5 x 2 Laboratory CBC W A Differential panel in Blood (HEM-CBC) - pre workup for HBO - (ICD10 E10.621 - 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) uto LOINC Code: 64332-9 Convenience Name: CBC W Auto Differential panel C reactive protein [Mass/volume] in Serum or Plasma (CHEM) - pre workup for HBO - (ICD10 E10.621 - 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) LOINC Code: 1988-5 Convenience Name: C Reactive Protein in serum or plasma Erythrocyte sedimentation rate (HEM) - pre workup for HBO - (ICD10 E10.621 - 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) LOINC Code: 51884-1 Convenience Name: Sed rate-method unspecified Radiology X-ray, Chest - pre workup for HBO - (ICD10 E10.621 - Type 1 diabetes mellitus with  foot ulcer) Services and Therapies Electrocardiogram (EKG) - pre workup for HBO - (ICD10 E10.621 - 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) Patient Medications llergies: fluvastatin, nitroglycerin, rosuvastatin, simvastatin, ezetimibe, Lipitor, lisinopril A Notifications Medication Indication Start End 02/13/2023 Cipro DOSE 1 - oral 500 mg tablet - 1 tablet oral twice a day x 30 days 02/13/2023 metronidazole DOSE 1 - oral 500 mg tablet - 1 tablet oral twice a day x 30 days Electronic Signature(s) Signed: 02/13/2023 10:18:08 AM By: [name] Previous Signature: 02/13/2023 9:45:50 AM Version By: [name] RN Entered By: [name] on 02/13/2023 10:18:08 Prescription 02/13/2023 -------------------------------------------------------------------------------- Lawrence Purl PA-C Patient Name: Provider: May 04, 1951 [id number] Date of Birth: NPI#Geoffery Spruce Sex: DEA #: [phone] Phone #: License #: [facility] Patient Address: [address] [facility] [place] [place] ([id number]) [phone].pdf Page 4 of 8 [place], [place] [address] [address], [address] [place], [place] [address] [phone] Allergies fluvastatin; nitroglycerin; rosuvastatin; simvastatin; ezetimibe; Lipitor; lisinopril Provider's Orders CBC W A Differential panel in Blood - ICD10: E10.621 - pre workup for HBO uto LOINC Code: 57021-8 Convenience Name: CBC W Auto Differential panel Hand Signature: Date(s): Prescription 02/13/2023 Barreiro, Anselmo Pickler PA-C Patient Name: Provider: 1951/05/24 [id number] Date of Birth: NPI#: [name] [id number] Sex: DEA #: [phone] Phone #: License #: [facility] Patient Address: [address] [facility] [place], [place] [address] [address] [place], [place]  [address] [phone] Allergies fluvastatin; nitroglycerin; rosuvastatin; simvastatin; ezetimibe; Lipitor; lisinopril Provider's Orders C reactive protein [Mass/volume] in Serum or Plasma - ICD10: E10.621 - pre workup for HBO LOINC  Code: 1988-5 Convenience Name: C Reactive Protein in serum or plasma Hand Signature: Date(s): Prescription 02/13/2023 Birnbaum, Anselmo Pickler PA-C Patient Name: Provider: 1951-05-07 [id number] Date of Birth: NPI#: [name] [id number] Sex: DEA #: [phone] Phone #: License #: [facility] Patient Address: [address] [facility] [place], [place] [address] [address] [place], [place] [address] [phone] Allergies fluvastatin; nitroglycerin; rosuvastatin; simvastatin; ezetimibe; Lipitor; lisinopril Provider's Orders Erythrocyte sedimentation rate - ICD10: E10.621 - pre workup for HBO LOINC Code: 30341-2 Convenience Name: Sed rate-method unspecified Hand Signature: Date(s): Prescription 02/13/2023 Brouhard, Anselmo Pickler PA-C Patient Name: Provider: December 03, 1950 [id number] Date of Birth: NPI#: [name] [id number] Sex: DEA #: [phone] Phone #: License #: [name] ([id number]) [id number] Page 5 of 8 [facility] Patient Address: [address] [facility] [place], [place] [address] [address] [place], [place] [address] [phone] Allergies fluvastatin; nitroglycerin; rosuvastatin; simvastatin; ezetimibe; Lipitor; lisinopril Provider's Orders X-ray, Chest - ICD10: E10.621 - pre workup for HBO Hand Signature: Date(s): Prescription 02/13/2023 Malicoat, Anselmo Pickler PA-C Patient Name: Provider: 1951/09/11 [id number] Date of Birth: NPI#: [name] [id number] Sex: DEA #: [phone] Phone #: License #: [facility] Patient Address: [address] [facility] [place], [place] [address] [address] [place], [place] [address] [phone] Allergies fluvastatin; nitroglycerin; rosuvastatin; simvastatin; ezetimibe; Lipitor; lisinopril Provider's Orders Electrocardiogram (EKG) - ICD10: E10.621 - pre workup for HBO Hand Signature: Date(s): Electronic Signature(s) Signed: 02/13/2023 1:04:02 PM By: [name] Entered By: [name] on 02/13/2023 10:18:08 -------------------------------------------------------------------------------- Problem List Details Patient Name: Date of Service: [name], [name] 02/13/2023 9:00 A M Medical Record Number: [id number] Patient Account Number: [id number] Date of Birth/Sex: Treating RN: [birth year] (72 y.o. Lawrence [name] Primary Care Provider: [name] Other Clinician: Referring Provider: Treating Provider/Extender: [name] in Treatment: 2 Active Problems ICD-10 Encounter Code Description Active Date MDM Diagnosis M86.371 Chronic multifocal osteomyelitis, right ankle and foot 02/13/2023 No Yes E10.621 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 01/30/2023 No Yes L97.522 Non-pressure chronic ulcer of other part of left foot with fat layer exposed 01/30/2023 No Yes L97.512 Non-pressure chronic ulcer of other part of right foot with fat layer exposed 01/30/2023 No Yes Lawrence [name], Lawrence [name] ([id number]) [id number] Page 6 of 8 I25.10 Atherosclerotic heart disease of native coronary artery without angina pectoris 01/30/2023 No Yes I10 Essential (primary) hypertension 01/30/2023 No Yes I73.89 Other specified peripheral vascular diseases 01/30/2023 No Yes N18.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, stage 3a 01/30/2023 No Yes Inactive Problems Resolved Problems Electronic Signature(s) Signed: 02/13/2023 10:18:28 AM By: [name] Previous Signature: 02/13/2023 9:18:41 AM  Version By: [name] Entered By: [name] on 02/13/2023 10:18:28 -------------------------------------------------------------------------------- Progress Note Details Patient Name: Date of Service: [name], [name] 02/13/2023 9:00 A M Medical Record Number: [id number] Patient Account Number: [id number] Date of Birth/Sex: Treating RN: 10-Jul-1951 (72 y.o. Lawrence [name] Primary Care Provider: [name] Other Clinician: Referring Provider: Treating Provider/Extender: [name] in Treatment: 2 Subjective Chief Complaint Information obtained from Patient Bilateral foot ulcers History of Present Illness (HPI) 01-30-2023 upon evaluation today patient appears to be doing poorly currently in regard to his feet bilaterally. He is actually referral from Dr. Gala Lewandowsky who is a local podiatrist. Subsequently Dr. Logan Bores has been seeing  him but wanted to refer him to [place] due to the fact that he is having difficulty healing in regard to his feet bilaterally. He has had several amputations. 1 back in the summer 2023 of the great toe right foot. Subsequently had a partial digit amputation of the second toe that was performed in November 2023. Subsequently since that time he had a hard time getting this to heal. Fortunately there does not appear to be any signs of infection has been using gentamicin currently as the treatment of choice. With that being said I feel like that we need to try to see about drying some of these wounds off of it which is my biggest concern at this point. Also think that he could potentially have osteomyelitis of this right foot he has not had an x-ray of the left foot Emina suggest MRI right foot and x-ray left foot he is already had an x-ray of the right foot at Dr. Logan Bores office and that did not reveal obvious signs of osteomyelitis but he does have bone exposure in the distal portion of the second toe right foot digit. Patient does  have a history significant for diabetes mellitus type 1, hypertension, peripheral vascular disease, chronic kidney disease stage IIIa. He also has undergone arterial study which was actually performed on 12-08-2021 and this shows that he actually has good arterial flow into the right lower extremity and left lower extremity for that matter. He had noncompressible ABIs but on the left he had a TBI of 0.68 and in regard to his flow it was actually better on the right even compared to the left with good pressures at all locations and again there does not appear to be any significant peripheral vascular disease that would prevent healing. 02-06-2023 upon evaluation today patient appears to be doing about the same in regard to his wounds. There is quite a bit of maceration of the great toe on the right foot does appear to be healed the remainder of the wounds all appear to be a little bit macerated. I think that he may need to change this dressing a little bit more frequently. We have been using a silver alginate dressing. 02-13-2023 upon evaluation today patient unfortunately is continuing to have some issues here with purulent drainage from his toes there is also an odor that both his wife and the nurse noted although I was not able to detect this due to my lack of smell. Nonetheless I do believe that this is something where we need to be a little more aggressive than the treatment of the wound to be perfectly honest. I discussed this with the patient today and he is in agreement with this plan. I am going to send in those antibiotics for him today. [address] Lawrence [name], Lawrence [name] ([id number]) [id number] Page 7 of 8 Constitutional Well-nourished and well-hydrated in no acute distress. Vitals Time Taken: 9:01 AM, Temperature: 97.8 F, Pulse: 68 bpm, Respiratory Rate: 18 breaths/min, Blood Pressure: 155/78 mmHg. Respiratory normal breathing without difficulty. Psychiatric this  patient is able to make decisions and demonstrates good insight into disease process. Alert and Oriented x 3. pleasant and cooperative. General Notes: Upon inspection patient's wound bed actually showed signs of poor granulation at this point. Unfortunately I think that he is going require an additional antibiotic therapy is already been on antibiotics prior to seeing me when he was being followed by podiatry. That was before his referral back towards [place]. Nonetheless right now I do think that  he is going to require some additional and stronger antibiotic therapy and he is in agreement with this plan. Prior to me placing him on the doxycycline he was on Augmentin Augmentin which began on December 22, 2022 and prior to that he was on straight amoxicillin on November 24, 2022. He therefore has been on antibiotic therapy prior to seeing me as ordered by other physicians for at least 30 days prior to seeing me. Integumentary (Hair, Skin) Wound #1 status is Open. Original cause of wound was Gradually Appeared. The date acquired was: 11/03/2022. The wound has been in treatment 2 weeks. The wound is located on the Right T Second. The wound measures 0.8cm length x 0.9cm width x 0.6cm depth; 0.565cm^2 area and 0.339cm^3 volume. There is oe bone exposed. There is no tunneling or undermining noted. There is a medium amount of serosanguineous drainage noted. The wound margin is distinct with the outline attached to the wound base. There is a large (67-100%) amount of necrotic tissue within the wound bed including Adherent Slough. Wound #2 status is Open. Original cause of wound was Gradually Appeared. The date acquired was: 11/03/2022. The wound has been in treatment 2 weeks. The wound is located on the Right T Third. The wound measures 1cm length x 1.4cm width x 0.3cm depth; 1.1cm^2 area and 0.33cm^3 volume. There is no oe tunneling or undermining noted. There is a medium amount of serosanguineous drainage noted. The  wound margin is distinct with the outline attached to the wound base. There is no granulation within the wound bed. There is a large (67-100%) amount of necrotic tissue within the wound bed including Adherent Slough. Wound #4 status is Open. Original cause of wound was Gradually Appeared. The date acquired was: 12/05/2022. The wound has been in treatment 2 weeks. The wound is located on the Left T Third. The wound measures 1.5cm length x 1.5cm width x 0.2cm depth; 1.767cm^2 area and 0.353cm^3 volume. There is no oe tunneling or undermining noted. There is a medium amount of serosanguineous drainage noted. Foul odor after cleansing was noted. The wound margin is distinct with the outline attached to the wound base. There is no granulation within the wound bed. There is a large (67-100%) amount of necrotic tissue within the wound bed including Adherent Slough. 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 Anesthetic (Use 'Patient Medications' Section for Anesthetic Order Entry): Lidocaine applied to wound bed Edema Control - Lymphedema / Segmental Compressive Device / Other: Elevate, Exercise Daily and Avoid Standing for Long Periods of Time. Elevate legs to the level of the heart and pump ankles as often as possible Elevate leg(s) parallel to the floor when sitting. Off-Loading: Open toe surgical shoe - right and left foot Laboratory ordered were: CBC W Auto Differential panel - pre workup for HBO, C Reactive  Protein in serum or plasma - pre workup for HBO, Sed rate -method unspecified - pre workup for HBO Radiology ordered were: X-ray, Chest - pre workup for HBO Services and Therapies ordered were: Electrocardiogram (EKG) - pre workup for HBO The following medication(s) was prescribed: Cipro  oral 500 mg tablet 1 1 tablet oral twice a day x 30 days starting 02/13/2023 metronidazole oral 500 mg tablet 1 1 tablet oral twice a day x 30 days starting 02/13/2023 WOUND #1: - T Second Wound Laterality: Right oe Cleanser: Soap and Water 1 x Per Day/30 Days Discharge Instructions: Gently cleanse wound with antibacterial soap, rinse and pat dry prior to dressing wounds Prim Dressing: Silvercel Small 2x2 (in/in) 1 x Per Day/30 Days ary Discharge Instructions: Apply Silvercel Small 2x2 (in/in) as instructed Secondary Dressing: Coverlet Latex-Free Fabric Adhesive Dressings 1 x Per Day/30 Days Discharge Instructions: 1.5 x 2 WOUND #2: - T Third Wound Laterality: Right oe Cleanser: Soap and Water 1 x Per Day/30 Days DADRIAN, DIETZ ([id number]) [id number] Page 8 of 8 Discharge Instructions: Gently cleanse wound with antibacterial soap, rinse and pat dry prior to dressing wounds Prim Dressing: Silvercel Small 2x2 (in/in) 1 x Per Day/30 Days ary Discharge Instructions: Apply Silvercel Small 2x2 (in/in) as instructed Secondary Dressing: Coverlet Latex-Free Fabric Adhesive Dressings 1 x Per Day/30 Days Discharge Instructions: 1.5 x 2 WOUND #4: - T Third Wound Laterality: Left oe Cleanser: Soap and Water 1 x Per Day/30 Days Discharge Instructions: Gently cleanse wound with antibacterial soap, rinse and pat dry prior to dressing wounds Prim Dressing: Silvercel Small 2x2 (in/in) 1 x Per Day/30 Days ary Discharge Instructions: Apply Silvercel Small 2x2 (in/in) as instructed Secondary Dressing: Coverlet Latex-Free Fabric Adhesive Dressings 1 x Per Day/30 Days Discharge Instructions:  1.5 x 2 1. Based on what I am seeing I am to go ahead and send him 2 additional antibiotics for him based on the severity of the infection that we are experiencing here. He is in agreement with this plan and I would go ahead and send in both Flagyl as well as Cipro for him today. He is in agreement with this he will take this as a 3 pill regimen along with the doxycycline which have already sent in as well last week and will get a continue this most likely for the next 2 possibly even 3 months. 2. I am going to recommend as well that the patient should continue to offload using the offloading shoes is much as possible I think that this is due to be still continued and utmost importance the more that he can stay off of his feet and prevent the toes from rubbing the better off he will be. 3. I am going to also go ahead and look towards getting HBO therapy approved. I think that the patient is a candidate and based on what was seen with both the MRI as well as his current physical findings and lack of progress I think that we need to try to get him going as quickly as possible. He is in agreement with this plan. We will see patient back for reevaluation in 1 week here in the clinic. If anything worsens or changes patient will contact our office for additional recommendations. Electronic Signature(s) Signed: 02/13/2023 10:18:39 AM By: [name] Previous Signature: 02/13/2023 9:36:37 AM Version By: [name] Entered By: [name] on 02/13/2023 10:18:39 -------------------------------------------------------------------------------- SuperBill Details Patient Name: Date of Service: [name], [name] 02/13/2023 Medical Record Number: [id number] Patient Account Number: [id number] Date of Birth/Sex: Treating RN: July 16, 1951 (72 y.o. 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 I10 Essential (primary) hypertension I73.89 Other specified peripheral vascular diseases N18.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, stage 3a Physician Procedures : CPT4 Code Description Modifier [id number] - WC PHYS LEVEL 4 - EST PT ICD-10 Diagnosis Description M86.371 Chronic multifocal osteomyelitis, right ankle and foot E10.621 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 L97.522 Non-pressure chronic ulcer of  other part of left foot with fat layer exposed L97.512 Non-pressure chronic ulcer of other part of right foot with fat layer exposed Quantity: 1 Electronic Signature(s) Signed: 02/13/2023 10:18:50 AM By: [name] Entered By: [name] on 02/13/2023 10:18:50

## 2023-02-18 ENCOUNTER — Ambulatory Visit
Admission: RE | Admit: 2023-02-18 | Discharge: 2023-02-18 | Disposition: A | Discharge status: Home / Self Care | Payer: Medicare Other | Source: Ambulatory Visit | Attending: Physician Assistant | Admitting: Physician Assistant

## 2023-02-18 DIAGNOSIS — Z01810 Encounter for preprocedural cardiovascular examination: Secondary | ICD-10-CM | POA: Diagnosis not present

## 2023-02-18 DIAGNOSIS — Z794 Long term (current) use of insulin: Secondary | ICD-10-CM | POA: Diagnosis not present

## 2023-02-18 DIAGNOSIS — E10621 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: Secondary | ICD-10-CM | POA: Insufficient documentation

## 2023-02-19 NOTE — Progress Notes (Signed)
Lawrence [name] ([id number]) [id number] Page 1 of 10 Visit Report for 02/13/2023 Arrival Information Details Patient Name: Date of Service: [name], [name] 02/13/2023 9:00 A M Medical Record Number: [id number] Patient Account Number: [id number] Date of Birth/Sex: Treating RN: 04-10-1951 (72 y.o. Lawrence [name]) [name] Primary Care Tanush Drees: [name] Other Clinician: Referring Marvon Shillingburg: Treating Jeannelle Wiens/Extender: [name] in Treatment: 2 Visit Information History Since Last Visit Added or deleted any medications: No Patient Arrived: Lawrence [name] Any new allergies or adverse reactions: No Arrival Time: 09:01 Had a fall or experienced change in No Accompanied By: wife activities of daily living that may affect Transfer Assistance: None risk of falls: Patient Identification Verified: Yes Signs or symptoms of abuse/neglect since last visito No Secondary Verification Process Completed: Yes Hospitalized since last visit: No Patient Requires Transmission-Based Precautions: No Implantable device outside of the clinic excluding No Patient Has Alerts: Yes cellular tissue based products placed in the center Patient Alerts: Patient on Blood Thinner since last visit: DIABETIC TYPE 1 Has Dressing in Place as Prescribed: Yes ABI L [place] TBI .68 10/07/22 Has Compression in Place as Prescribed: Yes ABI R [place] 10/07/22 Pain Present Now: No Electronic Signature(s) Signed: 02/18/2023 4:41:51 PM By: [name] RN Entered By: [name] on 02/13/2023 09:01:42 -------------------------------------------------------------------------------- Clinic Level of Care Assessment Details Patient Name: Date of Service: [name], [name] 02/13/2023 9:00 A M Medical Record Number: [id number] Patient Account Number: [id number] Date of Birth/Sex: Treating RN: 03-23-1951 (72 y.o. Lawrence [name]) [name] Primary Care Chiyo Fay: [name] Other Clinician: Referring  Jamonte Curfman: Treating Torez Beauregard/Extender: [name] in Treatment: 2 Clinic Level of Care Assessment Items TOOL 4 Quantity Score X- 1 0 Use when only an EandM is performed on FOLLOW-UP visit ASSESSMENTS - Nursing Assessment / Reassessment X- 1 10 Reassessment of Co-morbidities (includes updates in patient status) X- 1 5 Reassessment of Adherence to Treatment Plan ASSESSMENTS - Wound and Skin A ssessment / Reassessment  - 0 Simple Wound Assessment / Reassessment - one wound X- 3 5 Complex Wound Assessment / Reassessment - multiple wounds  - 0 Dermatologic / Skin Assessment (not related to wound area) ASSESSMENTS - Focused Assessment  - 0 Circumferential Edema Measurements - multi extremities  - 0 Nutritional Assessment / Counseling / Intervention  - 0 Lower Extremity Assessment (monofilament, tuning fork, pulses)  - 0 Peripheral Arterial Disease Assessment (using hand held doppler) ASSESSMENTS - Ostomy and/or Continence Assessment and Care  - 0 Incontinence Assessment and Management  - 0 Ostomy Care Assessment and Management (repouching, etc.) PROCESS - Coordination of Care CASYN, BECVAR ([id number]) [id number] Page 2 of 10 X- 1 15 Simple Patient / Family Education for ongoing care  - 0 Complex (extensive) Patient / Family Education for ongoing care  - 0 Staff obtains Chiropractor, Records, T Results / Process Orders est  - 0 Staff telephones HHA, Nursing Homes / Clarify orders / etc  - 0 Routine Transfer to another Facility (non-emergent condition)  - 0 Routine Hospital Admission (non-emergent condition) X- 1 15 New Admissions / Manufacturing engineer / Ordering NPWT Apligraf, etc. ,  - 0 Emergency Hospital Admission (emergent condition) X- 1 10 Simple Discharge Coordination  - 0 Complex (extensive) Discharge Coordination PROCESS - Special Needs  - 0 Pediatric / Minor Patient  Management  - 0 Isolation Patient Management  - 0 Hearing / Language / Visual special needs  - 0 Assessment of Community assistance (transportation, D/C  planning, etc.) []  - 0 Additional assistance / Altered mentation []  - 0 Support Surface(s) Assessment (bed, cushion, seat, etc.) INTERVENTIONS - Wound Cleansing / Measurement []  - 0 Simple Wound Cleansing - one wound X- 3 5 Complex Wound Cleansing - multiple wounds X- 1 5 Wound Imaging (photographs - any number of wounds) []  - 0 Wound Tracing (instead of photographs) []  - 0 Simple Wound Measurement - one wound X- 3 5 Complex Wound Measurement - multiple wounds INTERVENTIONS - Wound Dressings X - Small Wound Dressing one or multiple wounds 3 10 []  - 0 Medium Wound Dressing one or multiple wounds []  - 0 Large Wound Dressing one or multiple wounds []  - 0 Application of Medications - topical []  - 0 Application of Medications - injection INTERVENTIONS - Miscellaneous []  - 0 External ear exam []  - 0 Specimen Collection (cultures, biopsies, blood, body fluids, etc.) []  - 0 Specimen(s) / Culture(s) sent or taken to Lab for analysis []  - 0 Patient Transfer (multiple staff / Nurse, adult / Similar devices) []  - 0 Simple Staple / Suture removal (25 or less) []  - 0 Complex Staple / Suture removal (26 or more) []  - 0 Hypo / Hyperglycemic Management (close monitor of Blood Glucose) []  - 0 Ankle / Brachial Index (ABI) - do not check if billed separately []  - 0 Vital Signs Has the patient been seen at the hospital within the last three years: Yes Total Score: 135 Level Of Care: New/Established - Level 4 Electronic Signature(s) Signed: 02/18/2023 4:41:51 PM By: [name] RN Humbarger, [name] ([id number]) [id number] Page 3 of 10 Entered By: [name] on 02/18/2023 10:11:57 -------------------------------------------------------------------------------- Encounter Discharge Information  Details Patient Name: Date of Service: [name], [name] 02/13/2023 9:00 A M Medical Record Number: [id number] Patient Account Number: [id number] Date of Birth/Sex: Treating RN: 09-30-1951 (72 y.o. Lawrence [name]) [name] Primary Care Maecyn Panning: [name] Other Clinician: Referring Sheenah Dimitroff: Treating Traivon Morrical/Extender: [name] in Treatment: 2 Encounter Discharge Information Items Discharge Condition: Stable Ambulatory Status: Cane Discharge Destination: Home Transportation: Private Auto Accompanied By: wife Schedule Follow-up Appointment: Yes Clinical Summary of Care: Electronic Signature(s) Signed: 02/18/2023 4:41:51 PM By: [name] RN Entered By: [name] on 02/13/2023 09:58:41 -------------------------------------------------------------------------------- Lower Extremity Assessment Details Patient Name: Date of Service: [name], [name] 02/13/2023 9:00 A M Medical Record Number: [id number] Patient Account Number: [id number] Date of Birth/Sex: Treating RN: 02-Jul-1951 (72 y.o. Lawrence [name]) [name] Primary Care Kaylise Blakeley: [name] Other Clinician: Referring Abshir Paolini: Treating Vickee Mormino/Extender: [name] in Treatment: 2 Vascular Assessment Pulses: Dorsalis Pedis Palpable: [Left:Yes] [Right:Yes] Electronic Signature(s) Signed: 02/18/2023 4:41:51 PM By: [name] RN Entered By: [name] on 02/13/2023 09:11:23 -------------------------------------------------------------------------------- Multi Wound Chart Details Patient Name: Date of Service: [name], [name] 02/13/2023 9:00 A M Medical Record Number: [id number] Patient Account Number: [id number] Date of Birth/Sex: Treating RN: 20-Sep-1951 (72 y.o. [name] Primary Care Marcquis Ridlon: [name] Other Clinician: Referring Liticia Gasior: Treating Davon Abdelaziz/Extender: [name] in Treatment: 2 Vital  Signs Height(in): Pulse(bpm): 68 Weight(lbs): Blood Pressure(mmHg): 155/78 Body Mass Index(BMI): Temperature(F): 97.8 Respiratory Rate(breaths/min): 18 [1:Photos:] [[id number] Page 4 of 10] Right T Second oe Right T Third oe Left T Third oe Wound Location: Gradually Appeared Gradually Appeared Gradually Appeared Wounding Event: Diabetic Wound/Ulcer of the Lower Diabetic Wound/Ulcer of the Lower Diabetic Wound/Ulcer of the Lower Primary Etiology: Extremity Extremity Extremity Glaucoma, Coronary Artery Disease, Glaucoma,  Coronary Artery Disease, Glaucoma, Coronary Artery Disease, Comorbid History: Hypertension, Myocardial Infarction, Hypertension, Myocardial Infarction, Hypertension, Myocardial Infarction, Type II Diabetes, End Stage Renal Type II Diabetes, End Stage Renal Type II Diabetes, End Stage Renal Disease Disease Disease 11/03/2022 11/03/2022 12/05/2022 Date Acquired: Weeks of Treatment: Open Open Open Wound Status: No No No Wound Recurrence: Yes Yes Yes Pending A mputation on Presentation: 0.8x0.9x0.6 1x1.4x0.3 1.5x1.5x0.2 Measurements L x W x D (cm) 0.565 1.1 1.767 A (cm) : rea 0.339 0.33 0.353 Volume (cm) : 28.00% -133.50% 6.30% % Reduction in A rea: -43.60% -602.10% 6.40% % Reduction in Volume: Grade 2 Grade 2 Grade 2 Classification: Medium Medium Medium Exudate A mount: Serosanguineous Serosanguineous Serosanguineous Exudate Type: red, brown red, brown red, brown Exudate Color: No No Yes Foul Odor A Cleansing: fter N/A N/A No Odor A nticipated Due to Product Use: Distinct, outline attached Distinct, outline attached Distinct, outline attached Wound Margin: N/A None Present (0%) None Present (0%) Granulation A mount: N/A Large (67-100%) Large (67-100%) Necrotic A mount: Bone: Yes Fascia: No Fascia: No Exposed Structures: Fat Layer (Subcutaneous Tissue): No Fat Layer (Subcutaneous Tissue): No Fat Layer  (Subcutaneous Tissue): No Tendon: No Tendon: No Muscle: No Muscle: No Joint: No Joint: No Bone: No Bone: No None None None Epithelialization: Treatment Notes Electronic Signature(s) Signed: 02/18/2023 4:41:51 PM By: [name] RN Entered By: [name] on 02/13/2023 09:11:27 -------------------------------------------------------------------------------- Multi-Disciplinary Care Plan Details Patient Name: Date of Service: [name] MS, [name] 02/13/2023 9:00 A M Medical Record Number: [id number] Patient Account Number: [id number] Date of Birth/Sex: Treating RN: 1951-07-28 (72 y.o. Lawrence [name]) [name] Primary Care Maeghan Canny: [name] Other Clinician: Referring Marcianna Daily: Treating Johnhenry Tippin/Extender: [name] in Treatment: 2 Active Inactive Necrotic Tissue Nursing Diagnoses: Knowledge deficit related to management of necrotic/devitalized tissue Goals: Necrotic/devitalized tissue will be minimized in the wound bed Date Initiated: 01/30/2023 Target Resolution Date: 03/02/2023 Goal Status: Active Interventions: Assess patient pain level pre-, during and post procedure and prior to discharge WARWICK, NICK ([id number]) [id number] Page 5 of 10 Notes: Wound/Skin Impairment Nursing Diagnoses: Knowledge deficit related to ulceration/compromised skin integrity Goals: Patient/caregiver will verbalize understanding of skin care regimen Date Initiated: 01/30/2023 Target Resolution Date: 03/02/2023 Goal Status: Active Ulcer/skin breakdown will have a volume reduction of 30% by week 4 Date Initiated: 01/30/2023 Target Resolution Date: 03/02/2023 Goal Status: Active Ulcer/skin breakdown will have a volume reduction of 50% by week 8 Date Initiated: 01/30/2023 Target Resolution Date: 04/01/2023 Goal Status: Active Ulcer/skin breakdown will have a volume reduction of 80% by week 12 Date Initiated: 01/30/2023 Target Resolution Date:  05/02/2023 Goal Status: Active Ulcer/skin breakdown will heal within 14 weeks Date Initiated: 01/30/2023 Target Resolution Date: 06/01/2023 Goal Status: Active Interventions: Assess patient/caregiver ability to obtain necessary supplies Assess patient/caregiver ability to perform ulcer/skin care regimen upon admission and as needed Assess ulceration(s) every visit Notes: Electronic Signature(s) Signed: 02/18/2023 4:41:51 PM By: [name] RN Entered By: [name] on 02/13/2023 09:11:53 -------------------------------------------------------------------------------- Pain Assessment Details Patient Name: Date of Service: [name], [name] 02/13/2023 9:00 A M Medical Record Number: [id number] Patient Account Number: [id number] Date of Birth/Sex: Treating RN: [birth year] (72 y.o. [name] Primary Care Barbee Mamula: [name] Other Clinician: Referring Aleshia Cartelli: Treating Sholom Dulude/Extender: [name] in Treatment: 2 Active Problems Location of Pain Severity and Description of Pain Patient Has Paino No Site Locations Pain Management and Medication Current Pain Management: Chumney, [name] (  [id number]) [id number] Page 6 of 10 Electronic Signature(s) Signed: 02/18/2023 4:41:51 PM By: [name] RN Entered By: [name] on 02/13/2023 09:02:08 -------------------------------------------------------------------------------- Patient/Caregiver Education Details Patient Name: Date of Service: [name] MS, [name] 4/12/2024andnbsp9:00 A M Medical Record Number: [id number] Patient Account Number: [id number] Date of Birth/Gender: Treating RN: Dec 27, 1950 (72 y.o. Lawrence [name]) [name] Primary Care Physician: [name] Other Clinician: Referring Physician: Treating Physician/Extender: [name] in Treatment: 2 Education Assessment Education Provided To: Patient Education Topics Provided Wound  Debridement: Handouts: Wound Debridement Methods: Explain/Verbal Responses: State content correctly Electronic Signature(s) Signed: 02/18/2023 4:41:51 PM By: [name] RN Entered By: [name] on 02/13/2023 09:12:09 -------------------------------------------------------------------------------- Wound Assessment Details Patient Name: Date of Service: [name], [name] 02/13/2023 9:00 A M Medical Record Number: [id number] Patient Account Number: [id number] Date of Birth/Sex: Treating RN: 29-Dec-1950 (72 y.o. Lawrence [name]) [name] Primary Care Dellene Mcgroarty: [name] Other Clinician: Referring Philippe Gang: Treating Annice Jolly/Extender: [name] in Treatment: 2 Wound Status Wound Number: 1 Primary Diabetic Wound/Ulcer of the Lower Extremity Etiology: Wound Location: Right T Second oe Wound Open Wounding Event: Gradually Appeared Status: Date Acquired: 11/03/2022 Comorbid Glaucoma, Coronary Artery Disease, Hypertension, Myocardial Weeks Of Treatment: 2 History: Infarction, Type II Diabetes, End Stage Renal Disease Clustered Wound: No Pending Amputation On Presentation Photos Wound Measurements Length: (cm) 0.8 Width: (cm) 0.9 Depth: (cm) 0.6 Area: (cm) 0.56 Boyson, [name] ([id number]) Volume: (cm) % Reduction in Area: 28% % Reduction in Volume: -43.6% Epithelialization: None 5 Tunneling: No [id number] Page 7 of 10 0.339 Undermining: No Wound Description Classification: Grade 2 Wound Margin: Distinct, outline attached Exudate Amount: Medium Exudate Type: Serosanguineous Exudate Color: red, brown Foul Odor After Cleansing: No Slough/Fibrino Yes Wound Bed Necrotic Amount: Large (67-100%) Exposed Structure Necrotic Quality: Adherent Slough Fat Layer (Subcutaneous Tissue) Exposed: No Bone Exposed: Yes Treatment Notes Wound #1 (Toe Second) Wound Laterality: Right Cleanser Soap and Water Discharge Instruction:  Gently cleanse wound with antibacterial soap, rinse and pat dry prior to dressing wounds Peri-Wound Care Topical Primary Dressing Silvercel Small 2x2 (in/in) Discharge Instruction: Apply Silvercel Small 2x2 (in/in) as instructed Secondary Dressing Coverlet Latex-Free Fabric Adhesive Dressings Discharge Instruction: 1.5 x 2 Secured With Compression Wrap Compression Stockings Add-Ons Electronic Signature(s) Signed: 02/18/2023 4:41:51 PM By: [name] RN Entered By: [name] on 02/13/2023 09:10:58 -------------------------------------------------------------------------------- Wound Assessment Details Patient Name: Date of Service: [name], [name] 02/13/2023 9:00 A M Medical Record Number: [id number] Patient Account Number: [id number] Date of Birth/Sex: Treating RN: [birth year] (72 y.o. Lawrence [name]) [name] Primary Care Octavie Westerhold: [name] Other Clinician: Referring Angelize Ryce: Treating Deionna Marcantonio/Extender: [name] in Treatment: 2 Wound Status Wound Number: 2 Primary Diabetic Wound/Ulcer of the Lower Extremity Etiology: Wound Location: Right T Third oe Wound Open Wounding Event: Gradually Appeared Status: Date Acquired: 11/03/2022 Comorbid Glaucoma, Coronary Artery Disease, Hypertension, Myocardial Weeks Of Treatment: 2 History: Infarction, Type II Diabetes, End Stage Renal Disease Clustered Wound: No Pending Amputation On Presentation Photos SAMEH, PERI ([id number]) [id number] Page 8 of 10 Wound Measurements Length: (cm) Width: (cm) Depth: (cm) Area: (cm) Volume: (cm) 1 % Reduction in Area: -133.5% 1.4 % Reduction in Volume: -602.1% 0.3 Epithelialization: None 1.1 Tunneling: No 0.33 Undermining: No Wound Description Classification: Grade 2 Wound Margin: Distinct, outline attached Exudate Amount: Medium Exudate Type: Serosanguineous Exudate Color: red, brown Foul Odor After Cleansing:  No Slough/Fibrino Yes Wound Bed Granulation Amount: None Present (0%)  Exposed Structure Necrotic Amount: Large (67-100%) Fascia Exposed: No Necrotic Quality: Adherent Slough Fat Layer (Subcutaneous Tissue) Exposed: No Tendon Exposed: No Muscle Exposed: No Joint Exposed: No Bone Exposed: No Treatment Notes Wound #2 (Toe Third) Wound Laterality: Right Cleanser Soap and Water Discharge Instruction: Gently cleanse wound with antibacterial soap, rinse and pat dry prior to dressing wounds Peri-Wound Care Topical Primary Dressing Silvercel Small 2x2 (in/in) Discharge Instruction: Apply Silvercel Small 2x2 (in/in) as instructed Secondary Dressing Coverlet Latex-Free Fabric Adhesive Dressings Discharge Instruction: 1.5 x 2 Secured With Compression Wrap Compression Stockings Add-Ons Electronic Signature(s) Signed: 02/18/2023 4:41:51 PM By: [name] RN Entered By: [name] on 02/13/2023 09:11:11 -------------------------------------------------------------------------------- Wound Assessment Details Patient Name: Date of Service: [name], [name] 02/13/2023 9:00 A M Medical Record Number: [id number] Patient Account Number: [id number] VITOR, OVERBAUGH ([id number]) [phone].pdf Page 9 of 10 Date of Birth/Sex: Treating RN: 19-Aug-1951 (72 y.o. Lawrence [name]) [name] Primary Care Tinea Nobile: Other Clinician: [name] Referring Paymon Rosensteel: Treating Samiyyah Moffa/Extender: [name] in Treatment: 2 Wound Status Wound Number: 4 Primary Diabetic Wound/Ulcer of the Lower Extremity Etiology: Wound Location: Left T Third oe Wound Open Wounding Event: Gradually Appeared Status: Date Acquired: 12/05/2022 Comorbid Glaucoma, Coronary Artery Disease, Hypertension, Myocardial Weeks Of Treatment: 2 History: Infarction, Type II Diabetes, End Stage Renal Disease Clustered Wound: No Pending Amputation On Presentation Photos Wound  Measurements Length: (cm) 1.5 Width: (cm) 1.5 Depth: (cm) 0.2 Area: (cm) 1.767 Volume: (cm) 0.353 % Reduction in Area: 6.3% % Reduction in Volume: 6.4% Epithelialization: None Tunneling: No Undermining: No Wound Description Classification: Grade 2 Wound Margin: Distinct, outline attached Exudate Amount: Medium Exudate Type: Serosanguineous Exudate Color: red, brown Foul Odor After Cleansing: Yes Due to Product Use: No Slough/Fibrino Yes Wound Bed Granulation Amount: None Present (0%) Exposed Structure Necrotic Amount: Large (67-100%) Fascia Exposed: No Necrotic Quality: Adherent Slough Fat Layer (Subcutaneous Tissue) Exposed: No Tendon Exposed: No Muscle Exposed: No Joint Exposed: No Bone Exposed: No Treatment Notes Wound #4 (Toe Third) Wound Laterality: Left Cleanser Soap and Water Discharge Instruction: Gently cleanse wound with antibacterial soap, rinse and pat dry prior to dressing wounds Peri-Wound Care Topical Primary Dressing Silvercel Small 2x2 (in/in) Discharge Instruction: Apply Silvercel Small 2x2 (in/in) as instructed Secondary Dressing Coverlet Latex-Free Fabric Adhesive Dressings Discharge Instruction: 1.5 x 2 Secured With Compression Berniece Andreas ([id number]) [id number] Page 10 of 10 Compression Stockings Add-Ons Electronic Signature(s) Signed: 02/18/2023 4:41:51 PM By: [name] RN Entered By: [name] on 02/13/2023 09:10:45 -------------------------------------------------------------------------------- Vitals Details Patient Name: Date of Service: [name], [name] 02/13/2023 9:00 A M Medical Record Number: [id number] Patient Account Number: [id number] Date of Birth/Sex: Treating RN: 1951/04/27 (72 y.o. Lawrence [name]) [name] Primary Care Afshin Chrystal: [name] Other Clinician: Referring Lyndsie Wallman: Treating Jadavion Spoelstra/Extender: [name] in Treatment: 2 Vital Signs Time  Taken: 09:01 Temperature (F): 97.8 Pulse (bpm): 68 Respiratory Rate (breaths/min): 18 Blood Pressure (mmHg): 155/78 Reference Range: 80 - 120 mg / dl Electronic Signature(s) Signed: 02/18/2023 4:41:51 PM By: [name] RN Entered By: [name] on 02/13/2023 09:02:00

## 2023-02-19 NOTE — Progress Notes (Signed)
[name] ([id number]) [id number] Page 1 of 1 Visit Report for 02/13/2023 HBO Patient Questionnaire Details Patient Name: Date of Service: [name], [name] 02/13/2023 9:00 A M Medical Record Number: [id number] Patient Account Number: [id number] Date of Birth/Sex: Treating RN: 08-Feb-1951 (72 y.o. Lawrence [name]) [name] Primary Care Caden Fatica: [name] Other Clinician: Referring Jabreel Chimento: Treating Magaline Steinberg/Extender: [name] in Treatment: 2 HBO Patient Questionnaire Items Answer A "Yes" answers must be brought to the hyperbaric physician's attention. ny Breathing or Lung problemso No Currently use tobacco productso No Used tobacco products in the pasto Yes Heart problemso Yes Seen a doctor for any heart or blood pressure problemso Yes If yes, provide the name of doctor: [facility] cardiology Do you take water pills (diuretic)o Yes If yes, last time taken: lasix Diabeteso Yes On Diabetes pillo No On Insulino Yes If yes, last time taken: 02/13/2023 Dialysiso No Eye problems like glaucomao Yes Ear problems or surgeryo Yes Sinus Problemso No Cancero Yes List the location: skin Surgery(s) for cancero Yes Describe the surgery (s): skin removed Radiation therapy for cancero No Chemotherapy for cancero No Confinement Anxiety (Claustrophobia- fear of confined places)o No Any medical implants/devices that are fully or partially implanted or attached to your bodyo Yes Pregnanto No Seizureso No Electronic Signature(s) Signed: 02/18/2023 4:41:51 PM By: [name] RN Entered By: [name] on 02/13/2023 09:57:19

## 2023-02-20 ENCOUNTER — Encounter: Payer: Medicare Other | Admitting: Physician Assistant

## 2023-02-20 DIAGNOSIS — E10621 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: Secondary | ICD-10-CM | POA: Diagnosis not present

## 2023-02-20 NOTE — Progress Notes (Addendum)
Lawrence [name] ([id number]) [id number] Page 1 of 9 Visit Report for 02/20/2023 Arrival Information Details Patient Name: Date of Service: [name], [name] 02/20/2023 10:45 A M Medical Record Number: [id number] Patient Account Number: [id number] Date of Birth/Sex: Treating RN: 10-22-1951 (72 y.o. Lawrence [name]) Lawrence [name] Primary Care Lawrence [name]: Lawrence [name] Other Clinician: Referring Lawrence [name]: Treating Lawrence [name]/Extender: Lawrence [name] in Treatment: 3 Visit Information History Since Last Visit Added or deleted any medications: No Patient Arrived: Ambulatory Any new allergies or adverse reactions: No Arrival Time: 10:38 Had a fall or experienced change in No Accompanied By: self activities of daily living that may affect Transfer Assistance: None risk of falls: Patient Identification Verified: Yes Signs or symptoms of abuse/neglect since last visito No Secondary Verification Process Completed: Yes Hospitalized since last visit: No Patient Requires Transmission-Based Precautions: No Implantable device outside of the clinic excluding No Patient Has Alerts: Yes cellular tissue based products placed in the center Patient Alerts: Patient on Blood Thinner since last visit: DIABETIC TYPE 1 Has Dressing in Place as Prescribed: Yes [name] TBI .68 10/07/22 Pain Present Now: No [name] 10/07/22 Electronic Signature(s) Signed: 02/20/2023 11:42:53 AM By: Lawrence Pax RN Entered By: Lawrence [name] on 02/20/2023 10:43:00 -------------------------------------------------------------------------------- Clinic Level of Care Assessment Details Patient Name: Date of Service: [name], [name] 02/20/2023 10:45 A M Medical Record Number: [id number] Patient Account Number: [id number] Date of Birth/Sex: Treating RN: [birth year] (72 y.o. Lawrence [name]) Lawrence [name] Primary Care Desa Rech: Lawrence [name] Other Clinician: Referring Jaydah Stahle: Treating  Lawrence [name]/Extender: Lawrence [name] in Treatment: 3 Clinic Level of Care Assessment Items TOOL 1 Quantity Score []  - 0 Use when EandM and Procedure is performed on INITIAL visit ASSESSMENTS - Nursing Assessment / Reassessment []  - 0 General Physical Exam (combine w/ comprehensive assessment (listed just below) when performed on new pt. evals) []  - 0 Comprehensive Assessment (HX, ROS, Risk Assessments, Wounds Hx, etc.) ASSESSMENTS - Wound and Skin Assessment / Reassessment []  - 0 Dermatologic / Skin Assessment (not related to wound area) ASSESSMENTS - Ostomy and/or Continence Assessment and Care []  - 0 Incontinence Assessment and Management []  - 0 Ostomy Care Assessment and Management (repouching, etc.) PROCESS - Coordination of Care []  - 0 Simple Patient / Family Education for ongoing care []  - 0 Complex (extensive) Patient / Family Education for ongoing care []  - 0 Staff obtains Chiropractor, Records, T Results / Process Orders est []  - 0 Staff telephones HHA, Nursing Homes / Clarify orders / etc []  - 0 Routine Transfer to another Facility (non-emergent condition) []  - 0 Routine Hospital Admission (non-emergent condition) []  - 0 New Admissions / Insurance Authorizations / Ordering NPWT Apligraf, etc. , Lawrence [name] ([id number]) [id number] Page 2 of 9 []  - 0 Emergency Hospital Admission (emergent condition) PROCESS - Special Needs []  - 0 Pediatric / Minor Patient Management []  - 0 Isolation Patient Management []  - 0 Hearing / Language / Visual special needs []  - 0 Assessment of Community assistance (transportation, D/C planning, etc.) []  - 0 Additional assistance / Altered mentation []  - 0 Support Surface(s) Assessment (bed, cushion, seat, etc.) INTERVENTIONS - Miscellaneous []  - 0 External ear exam []  - 0 Patient Transfer (multiple staff / Nurse, adult / Similar devices) []  - 0 Simple Staple / Suture removal (25 or  less) []  - 0 Complex Staple / Suture removal (26 or more) []  - 0 Hypo/Hyperglycemic Management (do not check if billed separately) []  -  0 Ankle / Brachial Index (ABI) - do not check if billed separately Has the patient been seen at the hospital within the last three years: Yes Total Score: 0 Level Of Care: ____ Electronic Signature(s) Signed: 02/20/2023 11:42:53 AM By: Lawrence Pax RN Entered By: Lawrence [name] on 02/20/2023 11:23:01 -------------------------------------------------------------------------------- Encounter Discharge Information Details Patient Name: Date of Service: [name], [name] 02/20/2023 10:45 A M Medical Record Number: [id number] Patient Account Number: [id number] Date of Birth/Sex: Treating RN: 09-Jun-1951 (72 y.o. Lawrence [name] Primary Care Rikita Grabert: Lawrence [name] Other Clinician: Referring Lawrence [name]: Treating Lawrence [name]/Extender: Lawrence [name] in Treatment: 3 Encounter Discharge Information Items Post Procedure Vitals Discharge Condition: Stable Temperature (F): 97.7 Ambulatory Status: Cane Pulse (bpm): 67 Discharge Destination: Home Respiratory Rate (breaths/min): 18 Transportation: Private Auto Blood Pressure (mmHg): 154/78 Accompanied By: self Schedule Follow-up Appointment: Yes Clinical Summary of Care: Electronic Signature(s) Signed: 02/20/2023 11:42:53 AM By: Lawrence Pax RN Entered By: Lawrence [name] on 02/20/2023 11:24:50 -------------------------------------------------------------------------------- Lower Extremity Assessment Details Patient Name: Date of Service: [name], [name] 02/20/2023 10:45 A M Medical Record Number: [id number] Patient Account Number: [id number] Date of Birth/Sex: Treating RN: 04/15/1951 (72 y.o. Lawrence [name] Primary Care Lawrence [name]: Lawrence [name] Other Clinician: Referring Lawrence [name]: Treating Lawrence [name]/Extender: Lawrence [name] in Treatment: 3 Vascular  Assessment Pulses: Dorsalis Lawrence, [name] ([id number]) [Left:Yes] [Right:[id number] Page 3 of 9 Yes] Electronic Signature(s) Signed: 02/20/2023 11:42:53 AM By: Lawrence Pax RN Entered By: Lawrence [name] on 02/20/2023 10:51:10 -------------------------------------------------------------------------------- Multi Wound Chart Details Patient Name: Date of Service: [name], [name] 02/20/2023 10:45 A M Medical Record Number: [id number] Patient Account Number: [id number] Date of Birth/Sex: Treating RN: 1951/07/01 (72 y.o. Lawrence [name]) Lawrence [name] Primary Care Carmin Dibartolo: Lawrence [name] Other Clinician: Referring Bryndle Corredor: Treating Amyr Sluder/Extender: Lawrence [name] in Treatment: 3 Vital Signs Height(in): Pulse(bpm): 67 Weight(lbs): Blood Pressure(mmHg): 154/78 Body Mass Index(BMI): Temperature(F): 97.7 Respiratory Rate(breaths/min): 18 [1:Photos:] Right T Second oe Right T Third oe Left T Third oe Wound Location: Gradually Appeared Gradually Appeared Gradually Appeared Wounding Event: Diabetic Wound/Ulcer of the Lower Diabetic Wound/Ulcer of the Lower Diabetic Wound/Ulcer of the Lower Primary Etiology: Extremity Extremity Extremity Glaucoma, Coronary Artery Disease, Glaucoma, Coronary Artery Disease, Glaucoma, Coronary Artery Disease, Comorbid History: Hypertension, Myocardial Infarction, Hypertension, Myocardial Infarction, Hypertension, Myocardial Infarction, Type II Diabetes, End Stage Renal Type II Diabetes, End Stage Renal Type II Diabetes, End Stage Renal Disease Disease Disease 11/03/2022 11/03/2022 12/05/2022 Date Acquired: 3 3 3  Weeks of Treatment: Open Open Open Wound Status: No No No Wound Recurrence: Yes Yes Yes Pending A mputation on Presentation: 0.5x0.4x0.3 1.2x1.5x0.3 2x2x0.2 Measurements L x W x D (cm) 0.157 1.414 3.142 A (cm) : rea 0.047 0.424 0.628 Volume (cm) : 80.00% -200.20% -66.70% % Reduction in  A rea: 80.10% -802.10% -66.60% % Reduction in Volume: Grade 2 Grade 2 Grade 2 Classification: Medium Medium Medium Exudate A mount: Serosanguineous Serosanguineous Serosanguineous Exudate Type: red, brown red, brown red, brown Exudate Color: No No Yes Foul Odor A Cleansing: fter N/A N/A No Odor A nticipated Due to Product Use: Distinct, outline attached Distinct, outline attached Distinct, outline attached Wound Margin: N/A None Present (0%) None Present (0%) Granulation A mount: N/A Large (67-100%) Large (67-100%) Necrotic A mount: Bone: Yes Fascia: No Fascia: No Exposed Structures: Fat Layer (Subcutaneous Tissue): No Fat Layer (Subcutaneous Tissue): No Fat Layer (Subcutaneous Tissue): No Tendon: No Tendon: No Muscle: No Muscle: No Joint:  No Joint: No Bone: No Bone: No None None None Epithelialization: Treatment Notes Electronic Signature(s) MEDARDO, HASSING ([id number]) [id number] Page 4 of 9 Signed: 02/20/2023 11:42:53 AM By: Lawrence Pax RN Entered By: Lawrence [name] on 02/20/2023 10:51:14 -------------------------------------------------------------------------------- Multi-Disciplinary Care Plan Details Patient Name: Date of Service: [name], [name] 02/20/2023 10:45 A M Medical Record Number: [id number] Patient Account Number: [id number] Date of Birth/Sex: Treating RN: Oct 09, 1951 (72 y.o. Lawrence [name]) Lawrence [name] Primary Care Markees Carns: Lawrence [name] Other Clinician: Referring Adelae Yodice: Treating Gradie Butrick/Extender: Lawrence [name] in Treatment: 3 Active Inactive Necrotic Tissue Nursing Diagnoses: Knowledge deficit related to management of necrotic/devitalized tissue Goals: Necrotic/devitalized tissue will be minimized in the wound bed Date Initiated: 01/30/2023 Target Resolution Date: 03/02/2023 Goal Status: Active Interventions: Assess patient pain level pre-, during and post procedure and prior to  discharge Notes: Wound/Skin Impairment Nursing Diagnoses: Knowledge deficit related to ulceration/compromised skin integrity Goals: Patient/caregiver will verbalize understanding of skin care regimen Date Initiated: 01/30/2023 Target Resolution Date: 03/02/2023 Goal Status: Active Ulcer/skin breakdown will have a volume reduction of 30% by week 4 Date Initiated: 01/30/2023 Target Resolution Date: 03/02/2023 Goal Status: Active Ulcer/skin breakdown will have a volume reduction of 50% by week 8 Date Initiated: 01/30/2023 Target Resolution Date: 04/01/2023 Goal Status: Active Ulcer/skin breakdown will have a volume reduction of 80% by week 12 Date Initiated: 01/30/2023 Target Resolution Date: 05/02/2023 Goal Status: Active Ulcer/skin breakdown will heal within 14 weeks Date Initiated: 01/30/2023 Target Resolution Date: 06/01/2023 Goal Status: Active Interventions: Assess patient/caregiver ability to obtain necessary supplies Assess patient/caregiver ability to perform ulcer/skin care regimen upon admission and as needed Assess ulceration(s) every visit Notes: Electronic Signature(s) Signed: 02/20/2023 11:42:53 AM By: Lawrence Pax RN Entered By: Lawrence [name] on 02/20/2023 10:51:26 -------------------------------------------------------------------------------- Pain Assessment Details Patient Name: Date of Service: [name] MS, [name] 02/20/2023 10:45 A M Medical Record Number: [id number] Patient Account Number: [id number] Date of Birth/Sex: Treating RN: 1951-02-09 (72 y.o. Lawrence [name] Primary Care Braeleigh Pyper: Lawrence [name] Other Clinician: Herma [name] ([id number]) [phone].pdf Page 5 of 9 Referring Westlee Devita: Treating Dechelle Attaway/Extender: Lawrence [name] in Treatment: 3 Active Problems Location of Pain Severity and Description of Pain Patient Has Paino No Site Locations Pain Management and Medication Current Pain  Management: Electronic Signature(s) Signed: 02/20/2023 11:42:53 AM By: Lawrence Pax RN Entered By: Lawrence [name] on 02/20/2023 10:43:47 -------------------------------------------------------------------------------- Patient/Caregiver Education Details Patient Name: Date of Service: [name] MS, [name] 4/19/2024andnbsp10:45 A M Medical Record Number: [id number] Patient Account Number: [id number] Date of Birth/Gender: Treating RN: 12-Nov-1950 (72 y.o. Lawrence [name] Primary Care Physician: Lawrence [name] Other Clinician: Referring Physician: Treating Physician/Extender: Lawrence [name] in Treatment: 3 Education Assessment Education Provided To: Patient Education Topics Provided Wound Debridement: Handouts: Wound Debridement Methods: Explain/Verbal Responses: State content correctly Electronic Signature(s) Signed: 02/20/2023 11:42:53 AM By: Lawrence Pax RN Entered By: Lawrence [name] on 02/20/2023 10:51:39 -------------------------------------------------------------------------------- Wound Assessment Details Patient Name: Date of Service: [name] MS, [name] 02/20/2023 10:45 A M Medical Record Number: [id number] Patient Account Number: [id number] Date of Birth/Sex: Treating RN: Jul 24, 1951 (72 y.o. Lawrence [name] Primary Care Rosamaria Donn: Lawrence [name] Other Clinician: Referring Hlee Fringer: Treating Jacinto Keil/Extender: Parks Neptune ([id number]) [phone].pdf Page 6 of 9 Weeks in Treatment: 3 Wound Status Wound Number: 1 Primary Diabetic Wound/Ulcer of the Lower Extremity Etiology: Wound Location: Right T Second oe Wound Open Wounding Event: Gradually Appeared Status: Date  Acquired: 11/03/2022 Comorbid Glaucoma, Coronary Artery Disease, Hypertension, Myocardial Weeks Of Treatment: 3 History: Infarction, Type II Diabetes, End Stage Renal Disease Clustered Wound: No Pending Amputation On  Presentation Photos Wound Measurements Length: (cm) 0.5 Width: (cm) 0.4 Depth: (cm) 0.3 Area: (cm) 0.157 Volume: (cm) 0.047 % Reduction in Area: 80% % Reduction in Volume: 80.1% Epithelialization: None Tunneling: No Undermining: No Wound Description Classification: Grade 3 Wagner Verification: MRI Wound Margin: Distinct, outline attached Exudate Amount: Medium Exudate Type: Serosanguineous Exudate Color: red, brown Foul Odor After Cleansing: No Slough/Fibrino Yes Wound Bed Necrotic Amount: Large (67-100%) Exposed Structure Necrotic Quality: Adherent Slough Fat Layer (Subcutaneous Tissue) Exposed: No Bone Exposed: Yes Treatment Notes Wound #1 (Toe Second) Wound Laterality: Right Cleanser Soap and Water Discharge Instruction: Gently cleanse wound with antibacterial soap, rinse and pat dry prior to dressing wounds Peri-Wound Care Topical Primary Dressing Silvercel Small 2x2 (in/in) Discharge Instruction: Apply Silvercel Small 2x2 (in/in) as instructed Secondary Dressing Coverlet Latex-Free Fabric Adhesive Dressings Discharge Instruction: 1.5 x 2 Secured With Compression Wrap Compression Stockings Add-Ons Electronic Signature(s) Signed: 02/23/2023 1:29:40 PM By: Lawrence Pax RN [place], [name] ([id number]) By: Lawrence Pax RN [phone].pdf Page 7 of 9 Signed: 02/23/2023 1:29:40 PM Previous Signature: 02/20/2023 11:42:53 AM Version By: Lawrence Pax RN Entered By: Lawrence [name] on 02/23/2023 13:29:39 -------------------------------------------------------------------------------- Wound Assessment Details Patient Name: Date of Service: [name], [name] 02/20/2023 10:45 A M Medical Record Number: [id number] Patient Account Number: [id number] Date of Birth/Sex: Treating RN: [birth year] (72 y.o. Lawrence [name]) Lawrence [name] Primary Care Meckenzie Balsley: Lawrence [name] Other Clinician: Referring Lynae Pederson: Treating Kellar Westberg/Extender: Lawrence [name] in Treatment: 3 Wound Status Wound Number: 2 Primary Diabetic Wound/Ulcer of the Lower Extremity Etiology: Wound Location: Right T Third oe Wound Open Wounding Event: Gradually Appeared Status: Date Acquired: 11/03/2022 Comorbid Glaucoma, Coronary Artery Disease, Hypertension, Myocardial Weeks Of Treatment: 3 History: Infarction, Type II Diabetes, End Stage Renal Disease Clustered Wound: No Pending Amputation On Presentation Photos Wound Measurements Length: (cm) 1.2 Width: (cm) 1.5 Depth: (cm) 0.3 Area: (cm) 1.414 Volume: (cm) 0.424 % Reduction in Area: -200.2% % Reduction in Volume: -802.1% Epithelialization: None Tunneling: No Undermining: No Wound Description Classification: Grade 3 Wagner Verification: MRI Wound Margin: Distinct, outline attached Exudate Amount: Medium Exudate Type: Serosanguineous Exudate Color: red, brown Foul Odor After Cleansing: No Slough/Fibrino Yes Wound Bed Granulation Amount: None Present (0%) Exposed Structure Necrotic Amount: Large (67-100%) Fascia Exposed: No Necrotic Quality: Adherent Slough Fat Layer (Subcutaneous Tissue) Exposed: No Tendon Exposed: No Muscle Exposed: No Joint Exposed: No Bone Exposed: No Treatment Notes Wound #2 (Toe Third) Wound Laterality: Right Cleanser Soap and Water Discharge Instruction: Gently cleanse wound with antibacterial soap, rinse and pat dry prior to dressing wounds Peri-Wound Care Topical BRENNDEN, MASTEN ([id number]) [id number] Page 8 of 9 Primary Dressing Silvercel Small 2x2 (in/in) Discharge Instruction: Apply Silvercel Small 2x2 (in/in) as instructed Secondary Dressing Coverlet Latex-Free Fabric Adhesive Dressings Discharge Instruction: 1.5 x 2 Secured With Compression Wrap Compression Stockings Add-Ons Electronic Signature(s) Signed: 02/23/2023 1:30:02 PM By: Lawrence Pax RN Previous Signature: 02/20/2023 11:42:53 AM Version By: Lawrence Pax RN Entered By: Lawrence [name] on 02/23/2023 13:30:02 -------------------------------------------------------------------------------- Wound Assessment Details Patient Name: Date of Service: [name] MS, [name] 02/20/2023 10:45 A M Medical Record Number: [id number] Patient Account Number: [id number] Date of Birth/Sex: Treating RN: [birth year] (72 y.o. Lawrence [name] Primary Care Dontrail Blackwell: Lawrence [name] Other Clinician: Referring Livian Vanderbeck: Treating Ripley Lovecchio/Extender: Lawrence [name]  in Treatment: 3 Wound Status Wound Number: 4 Primary Diabetic Wound/Ulcer of the Lower Extremity Etiology: Wound Location: Left T Third oe Wound Open Wounding Event: Gradually Appeared Status: Date Acquired: 12/05/2022 Comorbid Glaucoma, Coronary Artery Disease, Hypertension, Myocardial Weeks Of Treatment: 3 History: Infarction, Type II Diabetes, End Stage Renal Disease Clustered Wound: No Pending Amputation On Presentation Photos Wound Measurements Length: (cm) 2 Width: (cm) 2 Depth: (cm) 0.2 Area: (cm) 3.142 Volume: (cm) 0.628 % Reduction in Area: -66.7% % Reduction in Volume: -66.6% Epithelialization: None Tunneling: No Undermining: No Wound Description Classification: Grade 3 Wagner Verification: X-Ray Wound Margin: Distinct, outline attached Exudate Amount: Medium Exudate Type: Serosanguineous Exudate Color: red, brown Foul Odor After Cleansing: Yes Due to Product Use: No Slough/Fibrino Yes Wound Bed Granulation Amount: None Present (0%) Exposed Structure Necrotic Amount: Large (67-100%) Fascia Exposed: No Necrotic Quality: Adherent Slough Fat Layer (Subcutaneous Tissue) Exposed: No Lawrence [name] ([id number]) [id number] Page 9 of 9 Tendon Exposed: No Muscle Exposed: No Joint Exposed: No Bone Exposed: No Treatment Notes Wound #4 (Toe Third) Wound Laterality: Left Cleanser Soap and Water Discharge Instruction: Gently  cleanse wound with antibacterial soap, rinse and pat dry prior to dressing wounds Peri-Wound Care Topical Primary Dressing Silvercel Small 2x2 (in/in) Discharge Instruction: Apply Silvercel Small 2x2 (in/in) as instructed Secondary Dressing Coverlet Latex-Free Fabric Adhesive Dressings Discharge Instruction: 1.5 x 2 Secured With Compression Wrap Compression Stockings Add-Ons Electronic Signature(s) Signed: 02/23/2023 1:30:27 PM By: Lawrence Pax RN Previous Signature: 02/20/2023 11:42:53 AM Version By: Lawrence Pax RN Entered By: Lawrence [name] on 02/23/2023 13:30:26 -------------------------------------------------------------------------------- Vitals Details Patient Name: Date of Service: [name], [name] 02/20/2023 10:45 A M Medical Record Number: [id number] Patient Account Number: [id number] Date of Birth/Sex: Treating RN: 1951/01/23 (72 y.o. Lawrence [name] Primary Care Kemo Spruce: Lawrence [name] Other Clinician: Referring Marasia Newhall: Treating Sapna Padron/Extender: Lawrence [name] in Treatment: 3 Vital Signs Time Taken: 10:43 Temperature (F): 97.7 Pulse (bpm): 67 Respiratory Rate (breaths/min): 18 Blood Pressure (mmHg): 154/78 Reference Range: 80 - 120 mg / dl Electronic Signature(s) Signed: 02/20/2023 11:42:53 AM By: Lawrence Pax RN Entered By: Lawrence [name] on 02/20/2023 10:43:41

## 2023-02-20 NOTE — Progress Notes (Addendum)
Lawrence [name] ([id number]) [id number] Page 1 of 9 Visit Report for 02/20/2023 Chief Complaint Document Details Patient Name: Date of Service: [name], [name] 02/20/2023 10:45 A M Medical Record Number: [id number] Patient Account Number: [id number] Date of Birth/Sex: Treating RN: Sep 02, 1951 (72 y.o. Lawrence [name]) Lawrence [name] Primary Care Provider: Vernona [name] Other Clinician: Referring Provider: Treating Provider/Extender: Lawrence [name] in Treatment: 3 Information Obtained from: Patient Chief Complaint Bilateral foot ulcers Electronic Signature(s) Signed: 02/20/2023 10:28:42 AM By: Lawrence Derry PA-C Entered By: Lawrence [name] on 02/20/2023 10:28:42 -------------------------------------------------------------------------------- Debridement Details Patient Name: Date of Service: [name], [name] 02/20/2023 10:45 A M Medical Record Number: [id number] Patient Account Number: [id number] Date of Birth/Sex: Treating RN: February 10, 1951 (72 y.o. Lawrence [name]) Lawrence [name] Primary Care Provider: Vernona [name] Other Clinician: Referring Provider: Treating Provider/Extender: Lawrence [name] in Treatment: 3 Debridement Performed for Assessment: Wound #4 Left T Third oe Performed By: Physician Lawrence Derry, PA-C Debridement Type: Debridement Severity of Tissue Pre Debridement: Fat layer exposed Level of Consciousness (Pre-procedure): Awake and Alert Pre-procedure Verification/Time Out Yes - 11:20 Taken: Start Time: 11:20 T Area Debrided (L x W): otal 2.5 (cm) x 2.5 (cm) = 6.25 (cm) Tissue and other material debrided: Viable, Non-Viable, Callus, Slough, Subcutaneous, Slough Level: Skin/Subcutaneous Tissue Debridement Description: Excisional Instrument: Curette Bleeding: Minimum Hemostasis Achieved: Pressure End Time: 11:24 Procedural Pain: 0 Post Procedural Pain: 0 Response to Treatment: Procedure was tolerated well Level of  Consciousness (Post- Awake and Alert procedure): Post Debridement Measurements of Total Wound Length: (cm) 2 Width: (cm) 2 Depth: (cm) 0.2 Volume: (cm) 0.628 Character of Wound/Ulcer Post Debridement: Requires Further Debridement Severity of Tissue Post Debridement: Fat layer exposed Post Procedure Diagnosis Same as Pre-procedure Electronic Signature(s) Signed: 02/20/2023 11:42:53 AM By: Lawrence Pax RN Signed: 02/20/2023 1:44:50 PM By: Lawrence Derry PA-C Entered By: Lawrence [name] on 02/20/2023 11:22:47 Lawrence [name] ([id number]) [id number] Page 2 of 9 -------------------------------------------------------------------------------- Debridement Details Patient Name: Date of Service: [name], [name] 02/20/2023 10:45 A M Medical Record Number: [id number] Patient Account Number: [id number] Date of Birth/Sex: Treating RN: 05-31-1951 (72 y.o. Lawrence [name]) Lawrence [name] Primary Care Provider: Vernona [name] Other Clinician: Referring Provider: Treating Provider/Extender: Lawrence [name] in Treatment: 3 Debridement Performed for Assessment: Wound #2 Right T Third oe Performed By: Physician Lawrence Derry, PA-C Debridement Type: Debridement Severity of Tissue Pre Debridement: Fat layer exposed Level of Consciousness (Pre-procedure): Awake and Alert Pre-procedure Verification/Time Out Yes - 11:20 Taken: Start Time: 11:20 T Area Debrided (L x W): otal 1.5 (cm) x 2.5 (cm) = 3.75 (cm) Tissue and other material debrided: Viable, Non-Viable, Callus, Slough, Subcutaneous, Slough Level: Skin/Subcutaneous Tissue Debridement Description: Excisional Instrument: Curette Bleeding: Minimum Hemostasis Achieved: Pressure End Time: 11:24 Procedural Pain: 0 Post Procedural Pain: 0 Response to Treatment: Procedure was tolerated well Level of Consciousness (Post- Awake and Alert procedure): Post Debridement Measurements of Total Wound Length: (cm)  1.2 Width: (cm) 1.5 Depth: (cm) 0.2 Volume: (cm) 0.283 Character of Wound/Ulcer Post Debridement: Requires Further Debridement Severity of Tissue Post Debridement: Fat layer exposed Post Procedure Diagnosis Same as Pre-procedure Electronic Signature(s) Signed: 02/20/2023 11:42:53 AM By: Lawrence Pax RN Signed: 02/20/2023 1:44:50 PM By: Lawrence Derry PA-C Entered By: Lawrence [name] on 02/20/2023 11:26:22 -------------------------------------------------------------------------------- Debridement Details Patient Name: Date of Service: Lawrence Householder MS, [name] 02/20/2023 10:45 A M Medical Record Number: [id number] Patient Account Number: [id number] Date of Birth/Sex: Treating RN:  1950/11/23 (72 y.o. Lawrence [name] Primary Care Provider: Vernona [name] Other Clinician: Referring Provider: Treating Provider/Extender: Lawrence [name] in Treatment: 3 Debridement Performed for Assessment: Wound #1 Right T Second oe Performed By: Physician Lawrence Derry, PA-C Debridement Type: Debridement Severity of Tissue Pre Debridement: Fat layer exposed Level of Consciousness (Pre-procedure): Awake and Alert Pre-procedure Verification/Time Out Yes - 11:20 Taken: Start Time: 11:20 T Area Debrided (L x W): otal 0.5 (cm) x 0.4 (cm) = 0.2 (cm) Tissue and other material debrided: Viable, Non-Viable, Bone, Callus, Slough, Subcutaneous, Slough Level: Skin/Subcutaneous Tissue/Muscle/Bone Debridement Description: Excisional Instrument: Curette Bleeding: Minimum Hemostasis Achieved: Pressure End Time: 11:24 Lawrence [name], Lawrence [name] ([id number]) [id number] Page 3 of 9 Procedural Pain: 0 Post Procedural Pain: 0 Response to Treatment: Procedure was tolerated well Level of Consciousness (Post- Awake and Alert procedure): Post Debridement Measurements of Total Wound Length: (cm) 0.5 Width: (cm) 0.4 Depth: (cm) 0.3 Volume: (cm) 0.047 Character of Wound/Ulcer Post  Debridement: Requires Further Debridement Severity of Tissue Post Debridement: Fat layer exposed Post Procedure Diagnosis Same as Pre-procedure Electronic Signature(s) Signed: 02/20/2023 11:42:53 AM By: Lawrence Pax RN Signed: 02/20/2023 1:44:50 PM By: Lawrence Derry PA-C Entered By: Lawrence [name] on 02/20/2023 11:27:06 -------------------------------------------------------------------------------- HPI Details Patient Name: Date of Service: [name], [name] 02/20/2023 10:45 A M Medical Record Number: [id number] Patient Account Number: [id number] Date of Birth/Sex: Treating RN: [birth year] (72 y.o. Lawrence [name] Primary Care Provider: Vernona [name] Other Clinician: Referring Provider: Treating Provider/Extender: Lawrence [name] in Treatment: 3 History of Present Illness HPI Description: 01-30-2023 upon evaluation today patient appears to be doing poorly currently in regard to his feet bilaterally. He is actually referral from Dr. Gala Lewandowsky who is a local podiatrist. Subsequently Dr. Logan Bores has been seeing him but wanted to refer him to [place] due to the fact that he is having difficulty healing in regard to his feet bilaterally. He has had several amputations. 1 back in the summer 2023 of the great toe right foot. Subsequently had a partial digit amputation of the second toe that was performed in November 2023. Subsequently since that time he had a hard time getting this to heal. Fortunately there does not appear to be any signs of infection has been using gentamicin currently as the treatment of choice. With that being said I feel like that we need to try to see about drying some of these wounds off of it which is my biggest concern at this point. Also think that he could potentially have osteomyelitis of this right foot he has not had an x-ray of the left foot Lawrence [name] suggest MRI right foot and x-ray left foot he is already had an x-ray of the right foot at Dr. Logan Bores office  and that did not reveal obvious signs of osteomyelitis but he does have bone exposure in the distal portion of the second toe right foot digit. Patient does have a history significant for diabetes mellitus type 1, hypertension, peripheral vascular disease, chronic kidney disease stage IIIa. He also has undergone arterial study which was actually performed on 12-08-2021 and this shows that he actually has good arterial flow into the right lower extremity and left lower extremity for that matter. He had noncompressible ABIs but on the left he had a TBI of 0.68 and in regard to his flow it was actually better on the right even compared to the left with good pressures at all locations and again there does not appear  to be any significant peripheral vascular disease that would prevent healing. 02-06-2023 upon evaluation today patient appears to be doing about the same in regard to his wounds. There is quite a bit of maceration of the great toe on the right foot does appear to be healed the remainder of the wounds all appear to be a little bit macerated. I think that he may need to change this dressing a little bit more frequently. We have been using a silver alginate dressing. 02-13-2023 upon evaluation today patient unfortunately is continuing to have some issues here with purulent drainage from his toes there is also an odor that both his wife and the nurse noted although I was not able to detect this due to my lack of smell. Nonetheless I do believe that this is something where we need to be a little more aggressive than the treatment of the wound to be perfectly honest. I discussed this with the patient today and he is in agreement with this plan. I am going to send in those antibiotics for him today. 02-20-2023 upon evaluation today patient appears to be doing decently well in regard to his wounds he is tolerating the antibiotics without complication and overall seems to be doing well in that regard.  Fortunately I do not see any signs of active infection locally nor systemically at this time. Electronic Signature(s) Signed: 02/20/2023 11:49:29 AM By: Lawrence Derry PA-C Entered By: Lawrence [name] on 02/20/2023 11:49:28 -------------------------------------------------------------------------------- Physical Exam Details Patient Name: Date of Service: [name], [name] 02/20/2023 10:45 A M Medical Record Number: [id number] Patient Account Number: [id number] Date of Birth/Sex: Treating RN: Dec 05, 1950 (72 y.o. Lawrence [name] Primary Care Provider: Vernona [name] Other Clinician: Herma [name] ([id number]) [id number] Page 4 of 9 Referring Provider: Treating Provider/Extender: Lawrence [name] in Treatment: 3 Constitutional Well-nourished and well-hydrated in no acute distress. Respiratory normal breathing without difficulty. Psychiatric this patient is able to make decisions and demonstrates good insight into disease process. Alert and Oriented x 3. pleasant and cooperative. Notes Patient's wounds currently did require sharp debridement clearway necrotic debris. He tolerated the debridement today without complication and postdebridement the wound bed appears to be doing much better which is great news. I do think that he is moving in the right direction I do think the hyperbarics can help get this clean I did have to remove some bone from the second toe right foot as well today which was necrotic on the distal tip that something that we have to keep close eye on as well there may be further debridement necessary before its all said and done. Electronic Signature(s) Signed: 02/20/2023 11:50:10 AM By: Lawrence Derry PA-C Entered By: Lawrence [name] on 02/20/2023 11:50:10 -------------------------------------------------------------------------------- Physician Orders Details Patient Name: Date of Service: [name], [name] 02/20/2023 10:45 A  M Medical Record Number: [id number] Patient Account Number: [id number] Date of Birth/Sex: Treating RN: Dec 30, 1950 (72 y.o. Lawrence [name]) Lawrence [name] Primary Care Provider: Vernona [name] Other Clinician: Referring Provider: Treating Provider/Extender: Lawrence [name] in Treatment: 3 Verbal / Phone Orders: No Diagnosis Coding ICD-10 Coding Code Description M86.371 Chronic multifocal osteomyelitis, right ankle and foot E10.621 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 I25.10 Atherosclerotic heart disease of native coronary artery without angina pectoris I10 Essential (primary) hypertension  I73.89 Other specified peripheral vascular diseases N18.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, stage 3a Follow-up Appointments Return Appointment in 1 week. Anesthetic (Use 'Patient Medications' Section for Anesthetic Order Entry) Lidocaine applied to wound bed Edema Control - Lymphedema / Segmental Compressive Device / Other Elevate, Exercise Daily and A void Standing for Long Periods of Time. Elevate legs to the level of the heart and pump ankles as often as possible Elevate leg(s) parallel to the floor when sitting. Off-Loading Open toe surgical shoe - right and left foot Wound Treatment Wound #1 - T Second oe Wound Laterality: Right Cleanser: Soap and Water 1 x Per Day/30 Days Discharge Instructions: Gently cleanse wound with antibacterial soap, rinse and pat dry prior to dressing wounds Prim Dressing: Silvercel Small 2x2 (in/in) 1 x Per Day/30 Days ary Discharge Instructions: Apply Silvercel Small 2x2 (in/in) as instructed Secondary Dressing: Coverlet Latex-Free Fabric Adhesive Dressings 1 x Per Day/30 Days Discharge Instructions: 1.5 x 2 RAYBURN, MUNDIS ([id number]) [id number] Page 5 of  9 Wound #2 - T Third oe Wound Laterality: Right Cleanser: Soap and Water 1 x Per Day/30 Days Discharge Instructions: Gently cleanse wound with antibacterial soap, rinse and pat dry prior to dressing wounds Prim Dressing: Silvercel Small 2x2 (in/in) 1 x Per Day/30 Days ary Discharge Instructions: Apply Silvercel Small 2x2 (in/in) as instructed Secondary Dressing: Coverlet Latex-Free Fabric Adhesive Dressings 1 x Per Day/30 Days Discharge Instructions: 1.5 x 2 Wound #4 - T Third oe Wound Laterality: Left Cleanser: Soap and Water 1 x Per Day/30 Days Discharge Instructions: Gently cleanse wound with antibacterial soap, rinse and pat dry prior to dressing wounds Prim Dressing: Silvercel Small 2x2 (in/in) 1 x Per Day/30 Days ary Discharge Instructions: Apply Silvercel Small 2x2 (in/in) as instructed Secondary Dressing: Coverlet Latex-Free Fabric Adhesive Dressings 1 x Per Day/30 Days Discharge Instructions: 1.5 x 2 Electronic Signature(s) Signed: 02/20/2023 11:42:53 AM By: Lawrence Pax RN Signed: 02/20/2023 1:44:50 PM By: Lawrence Derry PA-C Entered By: Lawrence [name] on 02/20/2023 11:20:20 -------------------------------------------------------------------------------- Problem List Details Patient Name: Date of Service: Lawrence Householder MS, [name] 02/20/2023 10:45 A M Medical Record Number: [id number] Patient Account Number: [id number] Date of Birth/Sex: Treating RN: 1951-05-10 (72 y.o. Lawrence [name]) Lawrence [name] Primary Care Provider: Vernona [name] Other Clinician: Referring Provider: Treating Provider/Extender: Lawrence [name] in Treatment: 3 Active Problems ICD-10 Encounter Code Description Active Date MDM Diagnosis M86.371 Chronic multifocal osteomyelitis, right ankle and foot 02/13/2023 No Yes E10.621 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 01/30/2023 No Yes L97.522 Non-pressure chronic ulcer of other part of left foot with fat layer exposed 01/30/2023 No Yes L97.512 Non-pressure  chronic ulcer of other part of right foot with fat layer exposed 01/30/2023 No Yes I25.10 Atherosclerotic heart disease of native coronary artery without angina pectoris 01/30/2023 No Yes I10 Essential (primary) hypertension 01/30/2023 No Yes I73.89 Other specified peripheral vascular diseases 01/30/2023 No Yes N18.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, stage 3a 01/30/2023 No Yes Lawrence [name], Lawrence [name] ([id number]) [id number] Page 6 of 9 Inactive Problems Resolved Problems Electronic Signature(s) Signed: 02/20/2023 10:28:38 AM By: Lawrence Derry PA-C Entered By: Lawrence [name] on 02/20/2023 10:28:38 -------------------------------------------------------------------------------- Progress Note Details Patient Name: Date of Service: [name], [name] 02/20/2023 10:45 A M Medical Record Number: [id number] Patient Account Number: [id number] Date of Birth/Sex: Treating RN: May 26, 1951 (72 y.o. Lawrence [name] Primary Care Provider: Vernona [name] Other Clinician: Referring Provider: Treating Provider/Extender: Lawrence [name] in Treatment: 3 Subjective Chief Complaint Information obtained from Patient  Bilateral foot ulcers History of Present Illness (HPI) 01-30-2023 upon evaluation today patient appears to be doing poorly currently in regard to his feet bilaterally. He is actually referral from Dr. Gala Lewandowsky who is a local podiatrist. Subsequently Dr. Logan Bores has been seeing him but wanted to refer him to [place] due to the fact that he is having difficulty healing in regard to his feet bilaterally. He has had several amputations. 1 back in the summer 2023 of the great toe right foot. Subsequently had a partial digit amputation of the second toe that was performed in November 2023. Subsequently since that time he had a hard time getting this to heal. Fortunately there does not appear to be any signs of infection has been using gentamicin currently as the treatment of choice.  With that being said I feel like that we need to try to see about drying some of these wounds off of it which is my biggest concern at this point. Also think that he could potentially have osteomyelitis of this right foot he has not had an x-ray of the left foot Lawrence [name] suggest MRI right foot and x-ray left foot he is already had an x-ray of the right foot at Dr. Logan Bores office and that did not reveal obvious signs of osteomyelitis but he does have bone exposure in the distal portion of the second toe right foot digit. Patient does have a history significant for diabetes mellitus type 1, hypertension, peripheral vascular disease, chronic kidney disease stage IIIa. He also has undergone arterial study which was actually performed on 12-08-2021 and this shows that he actually has good arterial flow into the right lower extremity and left lower extremity for that matter. He had noncompressible ABIs but on the left he had a TBI of 0.68 and in regard to his flow it was actually better on the right even compared to the left with good pressures at all locations and again there does not appear to be any significant peripheral vascular disease that would prevent healing. 02-06-2023 upon evaluation today patient appears to be doing about the same in regard to his wounds. There is quite a bit of maceration of the great toe on the right foot does appear to be healed the remainder of the wounds all appear to be a little bit macerated. I think that he may need to change this dressing a little bit more frequently. We have been using a silver alginate dressing. 02-13-2023 upon evaluation today patient unfortunately is continuing to have some issues here with purulent drainage from his toes there is also an odor that both his wife and the nurse noted although I was not able to detect this due to my lack of smell. Nonetheless I do believe that this is something where we need to be a little more aggressive than the treatment of  the wound to be perfectly honest. I discussed this with the patient today and he is in agreement with this plan. I am going to send in those antibiotics for him today. 02-20-2023 upon evaluation today patient appears to be doing decently well in regard to his wounds he is tolerating the antibiotics without complication and overall seems to be doing well in that regard. Fortunately I do not see any signs of active infection locally nor systemically at this time. Objective Constitutional Well-nourished and well-hydrated in no acute distress. Vitals Time Taken: 10:43 AM, Temperature: 97.7 F, Pulse: 67 bpm, Respiratory Rate: 18 breaths/min, Blood Pressure: 154/78 mmHg. Respiratory normal breathing  without difficulty. Psychiatric this patient is able to make decisions and demonstrates good insight into disease process. Alert and Oriented x 3. pleasant and cooperative. General Notes: Patient's wounds currently did require sharp debridement clearway necrotic debris. He tolerated the debridement today without complication and postdebridement the wound bed appears to be doing much better which is great news. I do think that he is moving in the right direction I do think the SAQIB, CAZAREZ ([id number]) [phone].pdf Page 7 of 9 hyperbarics can help get this clean I did have to remove some bone from the second toe right foot as well today which was necrotic on the distal tip that something that we have to keep close eye on as well there may be further debridement necessary before its all said and done. Integumentary (Hair, Skin) Wound #1 status is Open. Original cause of wound was Gradually Appeared. The date acquired was: 11/03/2022. The wound has been in treatment 3 weeks. The wound is located on the Right T Second. The wound measures 0.5cm length x 0.4cm width x 0.3cm depth; 0.157cm^2 area and 0.047cm^3 volume. There is oe bone exposed. There is no tunneling or undermining  noted. There is a medium amount of serosanguineous drainage noted. The wound margin is distinct with the outline attached to the wound base. There is a large (67-100%) amount of necrotic tissue within the wound bed including Adherent Slough. Wound #2 status is Open. Original cause of wound was Gradually Appeared. The date acquired was: 11/03/2022. The wound has been in treatment 3 weeks. The wound is located on the Right T Third. The wound measures 1.2cm length x 1.5cm width x 0.3cm depth; 1.414cm^2 area and 0.424cm^3 volume. There is no oe tunneling or undermining noted. There is a medium amount of serosanguineous drainage noted. The wound margin is distinct with the outline attached to the wound base. There is no granulation within the wound bed. There is a large (67-100%) amount of necrotic tissue within the wound bed including Adherent Slough. Wound #4 status is Open. Original cause of wound was Gradually Appeared. The date acquired was: 12/05/2022. The wound has been in treatment 3 weeks. The wound is located on the Left T Third. The wound measures 2cm length x 2cm width x 0.2cm depth; 3.142cm^2 area and 0.628cm^3 volume. There is no oe tunneling or undermining noted. There is a medium amount of serosanguineous drainage noted. Foul odor after cleansing was noted. The wound margin is distinct with the outline attached to the wound base. There is no granulation within the wound bed. There is a large (67-100%) amount of necrotic tissue within the wound bed including Adherent Slough. 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 There was a Excisional Skin/Subcutaneous Tissue/Muscle/Bone Debridement with a total area of 0.2 sq cm performed by Lawrence Derry, PA-C. With the following instrument(s): Curette to remove Viable and Non-Viable tissue/material. Material removed includes Bone,Callus, Subcutaneous Tissue, and Slough. No specimens were taken. A time out  was conducted at 11:20, prior to the start of the procedure. A Minimum amount of bleeding was controlled with Pressure. The procedure was tolerated well with a pain level of 0 throughout and a pain level of 0 following the procedure. Post Debridement Measurements: 0.5cm length x 0.4cm width x 0.3cm depth; 0.047cm^3 volume. Character of Wound/Ulcer Post Debridement requires further debridement. Severity of Tissue Post Debridement is: Fat layer exposed. 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 There was a Excisional Skin/Subcutaneous Tissue Debridement with a total area of 3.75 sq cm performed by Lawrence Derry, PA-C. With the following instrument(s): Curette to remove Viable and Non-Viable tissue/material. Material removed includes Callus, Subcutaneous Tissue, and Slough. No specimens were taken. A time out was conducted at 11:20, prior to the start of the procedure. A Minimum amount of bleeding was controlled with Pressure. The procedure was tolerated well with a pain level of 0 throughout and a pain level of 0 following the procedure. Post Debridement Measurements: 1.2cm length x 1.5cm width x 0.2cm depth; 0.283cm^3  volume. Character of Wound/Ulcer Post Debridement requires further debridement. Severity of Tissue Post Debridement is: Fat layer exposed. Post procedure Diagnosis Wound #2: Same as Pre-Procedure Wound #4 Pre-procedure diagnosis of Wound #4 is a Diabetic Wound/Ulcer of the Lower Extremity located on the Left T Third .Severity of Tissue Pre Debridement is: oe Fat layer exposed. There was a Excisional Skin/Subcutaneous Tissue Debridement with a total area of 6.25 sq cm performed by Lawrence Derry, PA-C. With the following instrument(s): Curette to remove Viable and Non-Viable tissue/material. Material removed includes Callus, Subcutaneous Tissue, and Slough. No specimens were taken. A time out was conducted at 11:20, prior to the start of the procedure. A Minimum amount of bleeding was controlled with Pressure. The procedure was tolerated well with a pain level of 0 throughout and a pain level of 0 following the procedure. Post Debridement Measurements: 2cm length x 2cm width x 0.2cm depth; 0.628cm^3 volume. Character of Wound/Ulcer Post Debridement requires further debridement. Severity of Tissue Post Debridement is: Fat layer exposed. Post procedure Diagnosis Wound #4: Same as Pre-Procedure Plan Follow-up Appointments: Return Appointment in 1 week. Anesthetic (Use 'Patient Medications' Section for Anesthetic Order Entry): Lidocaine applied to wound bed Edema Control - Lymphedema / Segmental Compressive Device / Other: Lawrence [name] ([id number]) [id number] Page 8 of 9 Elevate, Exercise Daily and Avoid Standing for Long Periods of Time. Elevate legs to the level of the heart and pump ankles as often as possible Elevate leg(s) parallel to the floor when sitting. Off-Loading: Open toe surgical shoe - right and left foot WOUND #1: - T Second Wound Laterality: Right oe Cleanser: Soap and Water 1 x Per Day/30 Days Discharge Instructions: Gently cleanse wound with  antibacterial soap, rinse and pat dry prior to dressing wounds Prim Dressing: Silvercel Small 2x2 (in/in) 1 x Per Day/30 Days ary Discharge Instructions: Apply Silvercel Small 2x2 (in/in) as instructed Secondary Dressing: Coverlet Latex-Free Fabric Adhesive Dressings 1 x Per Day/30 Days Discharge Instructions: 1.5 x 2 WOUND #2: - T Third Wound Laterality: Right oe Cleanser: Soap and Water 1 x Per Day/30 Days Discharge Instructions: Gently cleanse wound with antibacterial soap, rinse and pat dry prior to dressing wounds Prim Dressing: Silvercel Small 2x2 (in/in) 1 x Per Day/30 Days ary Discharge Instructions: Apply Silvercel  Small 2x2 (in/in) as instructed Secondary Dressing: Coverlet Latex-Free Fabric Adhesive Dressings 1 x Per Day/30 Days Discharge Instructions: 1.5 x 2 WOUND #4: - T Third Wound Laterality: Left oe Cleanser: Soap and Water 1 x Per Day/30 Days Discharge Instructions: Gently cleanse wound with antibacterial soap, rinse and pat dry prior to dressing wounds Prim Dressing: Silvercel Small 2x2 (in/in) 1 x Per Day/30 Days ary Discharge Instructions: Apply Silvercel Small 2x2 (in/in) as instructed Secondary Dressing: Coverlet Latex-Free Fabric Adhesive Dressings 1 x Per Day/30 Days Discharge Instructions: 1.5 x 2 1. Based on what I am seeing I do believe that the patient is making good progress although I think we do need to see about getting hyperbaric started as quickly as possible and I am to go ahead and proceed with the approval process have gotten all the pieces of the puzzle together as far as being able to submit for approval and I am in a go ahead and get that altogether to submit today as well. 2. I am going to suggest that we continue with the wound care measures as before we been utilizing currently the silver cell which I think is still to be the right way to go we will continue as such with that. 3. I am also can recommend the patient should continue to elevate  his legs much as possible to help with edema control. We will see patient back for reevaluation in 1 week here in the clinic. If anything worsens or changes patient will contact our office for additional recommendations. Electronic Signature(s) Signed: 02/20/2023 1:12:04 PM By: Lawrence Derry PA-C Entered By: Lawrence [name] on 02/20/2023 13:12:03 -------------------------------------------------------------------------------- SuperBill Details Patient Name: Date of Service: [name], [name] 02/20/2023 Medical Record Number: [id number] Patient Account Number: [id number] Date of Birth/Sex: Treating RN: 09-22-1951 (72 y.o. 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 I10 Essential (primary) hypertension I73.89 Other specified peripheral vascular diseases N18.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, stage 3a Facility Procedures : CPT4 Code: [id number] Description: 11042 - DEB SUBQ TISSUE 20 SQ CM/< ICD-10 Diagnosis Description 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 Modifier: Quantity: 1 : [place], G CPT4 Code: [id number] Lawrence [name] ([id number]) ICD-10 L Description: 11044 - DEB BONE 20 SQ CM/< [phone] Diagnosis Description 97.512 Non-pressure chronic ulcer of other part of right foot with fat layer exposed Modifier:  [id number] Pa Quantity: 1 ge 9 of 9 Physician Procedures : CPT4: Description Modifier Code [id number] 11042 - WC PHYS SUBQ TISS 20 SQ CM ICD-10 Diagnosis Description L97.522 Non-pressure chronic ulcer of other part of left foot with fat layer exposed L97.512 Non-pressure chronic ulcer of other part of right foot  with fat layer exposed Quantity: 1 : CPT4: [id number] Debridement; bone (includes epidermis, dermis, subQ tissue, muscle and/or fascia, if performed) 1st 20 sqcm or less ICD-10 Diagnosis Description L97.512 Non-pressure chronic ulcer of other part of right foot with fat layer exposed Quantity: 1 Electronic Signature(s)  Signed: 02/20/2023 1:12:20 PM By: Lawrence Derry PA-C Entered By: Lawrence [name] on 02/20/2023 13:12:20

## 2023-02-24 ENCOUNTER — Encounter: Payer: Medicare Other | Admitting: Physician Assistant

## 2023-02-24 DIAGNOSIS — E10621 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: Secondary | ICD-10-CM | POA: Diagnosis not present

## 2023-02-24 LAB — GLUCOSE, CAPILLARY
Glucose-Capillary: 153 mg/dL — ABNORMAL HIGH (ref 70–99)
Glucose-Capillary: 144 mg/dL — ABNORMAL HIGH (ref 70–99)

## 2023-02-26 ENCOUNTER — Encounter: Payer: Medicare Other | Admitting: Physician Assistant

## 2023-02-26 DIAGNOSIS — E10621 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: Secondary | ICD-10-CM | POA: Diagnosis not present

## 2023-02-26 LAB — GLUCOSE, CAPILLARY
Glucose-Capillary: 335 mg/dL — ABNORMAL HIGH (ref 70–99)
Glucose-Capillary: 264 mg/dL — ABNORMAL HIGH (ref 70–99)

## 2023-02-27 ENCOUNTER — Encounter: Payer: Medicare Other | Admitting: Physician Assistant

## 2023-02-27 DIAGNOSIS — E10621 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: Secondary | ICD-10-CM | POA: Diagnosis not present

## 2023-02-27 LAB — GLUCOSE, CAPILLARY
Glucose-Capillary: 208 mg/dL — ABNORMAL HIGH (ref 70–99)
Glucose-Capillary: 156 mg/dL — ABNORMAL HIGH (ref 70–99)

## 2023-02-28 NOTE — Progress Notes (Addendum)
Lawrence [name] ([id number]) [id number] Page 1 of 8 Visit Report for 02/27/2023 Arrival Information Details Patient Name: Date of Service: Lawrence [name], [name] 02/27/2023 12:45 PM Medical Record Number: [id number] Patient Account Number: [id number] Date of Birth/Sex: Treating RN: [birth year] (72 y.o. Lawrence [name] Primary Care Valentine Barney: [name] Other Clinician: Referring Edd Reppert: Treating Lynnzie Blackson/Extender: [name] in Treatment: 4 Visit Information History Since Last Visit Added or deleted any medications: No Patient Arrived: Cane Pain Present Now: No Arrival Time: 12:41 Accompanied By: wife Transfer Assistance: None Patient Identification Verified: Yes Secondary Verification Process Completed: Yes Patient Requires Transmission-Based Precautions: No Patient Has Alerts: Yes Patient Alerts: Patient on Blood Thinner DIABETIC TYPE 1 Lawrence [name] .68 10/07/22 Lawrence R Kunkle 10/07/22 Electronic Signature(s) Signed: 02/27/2023 1:21:35 PM By: Elliot Gurney, BSN, RN, CWS, [name] RN, BSN Entered By: Elliot Gurney, BSN, RN, CWS, [name] on 02/27/2023 12:42:33 -------------------------------------------------------------------------------- Encounter Discharge Information Details Patient Name: Date of Service: Lawrence [name], [name] 02/27/2023 12:45 PM Medical Record Number: [id number] Patient Account Number: [id number] Date of Birth/Sex: Treating RN: 06/15/1951 (72 y.o. Lawrence [name] Primary Care Gregor Dershem: [name] Other Clinician: Referring Ketzia Guzek: Treating Halil Rentz/Extender: [name] in Treatment: 4 Encounter Discharge Information Items Post Procedure Vitals Discharge Condition: Stable Temperature (F): 97.7 Ambulatory Status: Ambulatory Pulse (bpm): 65 Discharge Destination: Home Respiratory Rate (breaths/min): 16 Transportation: Private Auto Blood Pressure (mmHg): 130/80 Accompanied By: self Schedule Follow-up  Appointment: Yes Clinical Summary of Care: Electronic Signature(s) Signed: 02/27/2023 1:21:35 PM By: Elliot Gurney, BSN, RN, CWS, [name] RN, BSN Entered By: Elliot Gurney, BSN, RN, CWS, [name] on 02/27/2023 13:13:54 -------------------------------------------------------------------------------- Lower Extremity Assessment Details Patient Name: Date of Service: Lawrence [name], [name] 02/27/2023 12:45 PM Medical Record Number: [id number] Patient Account Number: [id number] Date of Birth/Sex: Treating RN: 03/07/1951 (72 y.o. Lawrence [name] Primary Care Oakes Mccready: [name] Other Clinician: Referring Tarique Loveall: Treating Amiracle Neises/Extender: [name] in Treatment: 4 Edema Assessment Assessed: [name]: Yes] Franne Forts: Yes] [Left: Edema] Franne Forts: :] Lawrence[LeftLionel December ([id number])] [Right: [id number] Page 2 of 8] Vascular Assessment Pulses: Dorsalis Pedis Palpable: [Left:Yes] [Right:Yes] Electronic Signature(s) Signed: 02/27/2023 1:21:35 PM By: Elliot Gurney, BSN, RN, CWS, [name] RN, BSN Entered By: Elliot Gurney, BSN, RN, CWS, [name] on 02/27/2023 12:51:11 -------------------------------------------------------------------------------- Multi Wound Chart Details Patient Name: Date of Service: Lawrence [name], [name] 02/27/2023 12:45 PM Medical Record Number: [id number] Patient Account Number: [id number] Date of Birth/Sex: Treating RN: 04-03-1951 (72 y.o. [name], [name] Primary Care Cameron Schwinn: [name] Other Clinician: Referring Barnaby Rippeon: Treating Estrella Alcaraz/Extender: [name] in Treatment: 4 Vital Signs Height(in): Capillary Blood Glucose(mg/dl): 601 Weight(lbs): Pulse(bpm): 65 Body Mass Index(BMI): Blood Pressure(mmHg): 130/80 Temperature(F): 97.7 Respiratory Rate(breaths/min): 18 [1:Photos: No Photos Right T Second oe Wound Location: Gradually Appeared Wounding Event: Diabetic Wound/Ulcer of the Lower Primary Etiology: Extremity 11/03/2022 Date  Acquired: 4 Weeks of Treatment: Open Wound Status: No Wound Recurrence: Yes Pending Lawrence mputation on  Presentation: 0.5x0.4x0.3 Measurements L x W x D (cm) 0.157 Lawrence (cm) : rea 0.047 Volume (cm) : 80.00% % Reduction in Lawrence rea: 80.10% % Reduction in Volume: Grade 3 Classification: Medium Exudate Lawrence mount: Serosanguineous Exudate Type: red, brown Exudate  Color:] [2:No Photos Right T Third oe Gradually Appeared Diabetic Wound/Ulcer of the Lower Extremity 11/03/2022 4 Open No Yes 0.6x1.1x0.1 0.518 0.052 -10.00% -10.60% Grade 3 Medium Serosanguineous red, brown] [4:No Photos Left T Third oe Gradually Appeared  Diabetic Wound/Ulcer of the Lower Extremity 12/05/2022 4 Open No Yes 1.6x1.9x0.2 2.388 0.478 -26.70% -26.80% Grade 3 Medium Serosanguineous red, brown] Treatment Notes Electronic Signature(s) Signed: 02/27/2023 1:21:35 PM By: Elliot Gurney, BSN, RN, CWS, [name] RN, BSN Entered By: Elliot Gurney, BSN, RN, CWS, [name] on 02/27/2023 12:51:25 -------------------------------------------------------------------------------- Multi-Disciplinary Care Plan Details Patient Name: Date of Service: Lawrence [name], [name] 02/27/2023 12:45 PM Medical Record Number: [id number] Patient Account Number: [id number] Date of Birth/Sex: Treating RN: [birth year] (72 y.o. Lawrence [name] Primary Care Rawley Harju: [name] Other Clinician: Referring Etsuko Dierolf: Treating Lue [place]/Extender: [name] in Treatment: [address] [name] ([id number]) [id number] Page 3 of 8 Necrotic Tissue Nursing Diagnoses: Knowledge deficit related to management of necrotic/devitalized tissue Goals: Necrotic/devitalized tissue will be minimized in the wound bed Date Initiated: 01/30/2023 Target Resolution Date: 03/02/2023 Goal Status: Active Interventions: Assess patient pain level pre-, during and post procedure and prior to discharge Notes: Wound/Skin Impairment Nursing Diagnoses: Knowledge  deficit related to ulceration/compromised skin integrity Goals: Patient/caregiver will verbalize understanding of skin care regimen Date Initiated: 01/30/2023 Target Resolution Date: 03/02/2023 Goal Status: Active Ulcer/skin breakdown will have Lawrence volume reduction of 30% by week 4 Date Initiated: 01/30/2023 Target Resolution Date: 03/02/2023 Goal Status: Active Ulcer/skin breakdown will have Lawrence volume reduction of 50% by week 8 Date Initiated: 01/30/2023 Target Resolution Date: 04/01/2023 Goal Status: Active Ulcer/skin breakdown will have Lawrence volume reduction of 80% by week 12 Date Initiated: 01/30/2023 Target Resolution Date: 05/02/2023 Goal Status: Active Ulcer/skin breakdown will heal within 14 weeks Date Initiated: 01/30/2023 Target Resolution Date: 06/01/2023 Goal Status: Active Interventions: Assess patient/caregiver ability to obtain necessary supplies Assess patient/caregiver ability to perform ulcer/skin care regimen upon admission and as needed Assess ulceration(s) every visit Notes: Electronic Signature(s) Signed: 02/27/2023 1:21:35 PM By: Elliot Gurney, BSN, RN, CWS, [name] RN, BSN Entered By: Elliot Gurney, BSN, RN, CWS, [name] on 02/27/2023 13:11:29 -------------------------------------------------------------------------------- Pain Assessment Details Patient Name: Date of Service: [name] MS, [name] 02/27/2023 12:45 PM Medical Record Number: [id number] Patient Account Number: [id number] Date of Birth/Sex: Treating RN: 1951/01/09 (72 y.o. Lawrence [name] Primary Care Macio Kissoon: [name] Other Clinician: Referring Antonia Culbertson: Treating Avonna Iribe/Extender: [name] in Treatment: 4 Active Problems Location of Pain Severity and Description of Pain Patient Has Paino No Site Locations [facility], [facility] ([id number]) [id number] Page 4 of 8 Pain Management and Medication Current Pain Management: Electronic Signature(s) Signed: 02/27/2023 1:21:35  PM By: Elliot Gurney, BSN, RN, CWS, [name] RN, BSN Entered By: Elliot Gurney, BSN, RN, CWS, [name] on 02/27/2023 12:43:07 -------------------------------------------------------------------------------- Patient/Caregiver Education Details Patient Name: Date of Service: [name] MS, [name] 4/26/2024andnbsp12:45 PM Medical Record Number: [id number] Patient Account Number: [id number] Date of Birth/Gender: Treating RN: 02/09/1951 (72 y.o. Lawrence [name] Primary Care Physician: [name] Other Clinician: Referring Physician: Treating Physician/Extender: [name] in Treatment: 4 Education Assessment Education Provided To: Patient Education Topics Provided Hyperbaric Oxygenation: Handouts: Hyperbaric Oxygen Methods: Explain/Verbal Responses: State content correctly Wound/Skin Impairment: Handouts: Caring for Your Ulcer Methods: Demonstration, Explain/Verbal Responses: State content correctly Electronic Signature(s) Signed: 02/27/2023 1:21:35 PM By: Elliot Gurney, BSN, RN, CWS, [name] RN, BSN Entered By: Elliot Gurney, BSN, RN, CWS, [name] on 02/27/2023 13:11:46 -------------------------------------------------------------------------------- Wound Assessment Details Patient Name: Date of Service: Lawrence [name], [name] 02/27/2023 12:45 PM Medical Record Number: [id number] Patient Account Number: [id number] Date of Birth/Sex: Treating RN: Jan 03, 1951 (72 y.o. Lawrence [name] Primary Care Rayley Gao: [name] Other  Clinician: Referring Delia Slatten: Treating Sindi Beckworth/Extender: [name] in Treatment: 4 Wound Status Ceasar, [name] ([id number]) [id number] Page 5 of 8 Wound Number: 1 Primary Diabetic Wound/Ulcer of the Lower Extremity Etiology: Wound Location: Right T Second oe Wound Open Wounding Event: Gradually Appeared Status: Date Acquired: 11/03/2022 Comorbid Glaucoma, Coronary Artery Disease, Hypertension, Myocardial Weeks Of Treatment:  4 History: Infarction, Type II Diabetes, End Stage Renal Disease Clustered Wound: No Pending Amputation On Presentation Photos Wound Measurements Length: (cm) 0.5 Width: (cm) 0.4 Depth: (cm) 0.3 Area: (cm) 0.157 Volume: (cm) 0.047 % Reduction in Area: 80% % Reduction in Volume: 80.1% Epithelialization: None Wound Description Classification: Grade 3 Wound Margin: Distinct, outline attached Exudate Amount: Medium Exudate Type: Serosanguineous Exudate Color: red, brown Foul Odor After Cleansing: No Slough/Fibrino Yes Wound Bed Necrotic Amount: Large (67-100%) Exposed Structure Necrotic Quality: Adherent Slough Fat Layer (Subcutaneous Tissue) Exposed: No Bone Exposed: Yes Treatment Notes Wound #1 (Toe Second) Wound Laterality: Right Cleanser Soap and Water Discharge Instruction: Gently cleanse wound with antibacterial soap, rinse and pat dry prior to dressing wounds Peri-Wound Care Topical Primary Dressing Silvercel Small 2x2 (in/in) Discharge Instruction: Apply Silvercel Small 2x2 (in/in) as instructed Secondary Dressing Coverlet Latex-Free Fabric Adhesive Dressings Discharge Instruction: 1.5 x 2 Secured With Compression Wrap Compression Stockings Add-Ons Electronic Signature(s) Signed: 03/02/2023 4:21:36 PM By: Angelina Pih Signed: 03/04/2023 11:09:32 AM By: Elliot Gurney, BSN, RN, CWS, [name] RN, BSN Previous Signature: 02/27/2023 1:21:35 PM Version By: Elliot Gurney, BSN, RN, CWS, [name] RN, BSN Entered By: Angelina Pih on 03/02/2023 16:16:16 Perdew, [name] ([id number]) [id number] Page 6 of 8 -------------------------------------------------------------------------------- Wound Assessment Details Patient Name: Date of Service: Lawrence [name], [name] 02/27/2023 12:45 PM Medical Record Number: [id number] Patient Account Number: [id number] Date of Birth/Sex: Treating RN: Apr 26, 1951 (72 y.o. [name], [name] Primary Care Raykwon Hobbs: [name] Other  Clinician: Referring Pati Thinnes: Treating Kalub Morillo/Extender: [name] in Treatment: 4 Wound Status Wound Number: 2 Primary Diabetic Wound/Ulcer of the Lower Extremity Etiology: Wound Location: Right T Third oe Wound Open Wounding Event: Gradually Appeared Status: Date Acquired: 11/03/2022 Comorbid Glaucoma, Coronary Artery Disease, Hypertension, Myocardial Weeks Of Treatment: 4 History: Infarction, Type II Diabetes, End Stage Renal Disease Clustered Wound: No Pending Amputation On Presentation Photos Wound Measurements Length: (cm) 0.6 Width: (cm) 1.1 Depth: (cm) 0.1 Area: (cm) 0.518 Volume: (cm) 0.052 % Reduction in Area: -10% % Reduction in Volume: -10.6% Epithelialization: None Wound Description Classification: Grade 3 Wound Margin: Distinct, outline attached Exudate Amount: Medium Exudate Type: Serosanguineous Exudate Color: red, brown Foul Odor After Cleansing: No Slough/Fibrino Yes Wound Bed Granulation Amount: None Present (0%) Exposed Structure Necrotic Amount: Large (67-100%) Fascia Exposed: No Necrotic Quality: Adherent Slough Fat Layer (Subcutaneous Tissue) Exposed: No Tendon Exposed: No Muscle Exposed: No Joint Exposed: No Bone Exposed: No Treatment Notes Wound #2 (Toe Third) Wound Laterality: Right Cleanser Soap and Water Discharge Instruction: Gently cleanse wound with antibacterial soap, rinse and pat dry prior to dressing wounds Peri-Wound Care Topical Primary Dressing Silvercel Small 2x2 (in/in) Discharge Instruction: Apply Silvercel Small 2x2 (in/in) as instructed Lawrence [name] ([id number]) [id number] Page 7 of 8 Secondary Dressing Coverlet Latex-Free Fabric Adhesive Dressings Discharge Instruction: 1.5 x 2 Secured With Compression Wrap Compression Stockings Add-Ons Electronic Signature(s) Signed: 03/02/2023 4:21:36 PM By: Angelina Pih Signed: 03/04/2023 11:09:32 AM By: Elliot Gurney,  BSN, RN, CWS, [name] RN, BSN Previous Signature: 02/27/2023 1:21:35 PM Version By: Elliot Gurney, BSN, RN, CWS, [name] RN, BSN Entered By: Angelina Pih on 03/02/2023  16:16:44 -------------------------------------------------------------------------------- Wound Assessment Details Patient Name: Date of Service: Lawrence [name], [name] 02/27/2023 12:45 PM Medical Record Number: [id number] Patient Account Number: [id number] Date of Birth/Sex: Treating RN: Jan 17, 1951 (72 y.o. [name], [name] Primary Care Perrin Eddleman: [name] Other Clinician: Referring Airelle Everding: Treating Caulder Wehner/Extender: [name] in Treatment: 4 Wound Status Wound Number: 4 Primary Diabetic Wound/Ulcer of the Lower Extremity Etiology: Wound Location: Left T Third oe Wound Open Wounding Event: Gradually Appeared Status: Date Acquired: 12/05/2022 Comorbid Glaucoma, Coronary Artery Disease, Hypertension, Myocardial Weeks Of Treatment: 4 History: Infarction, Type II Diabetes, End Stage Renal Disease Clustered Wound: No Pending Amputation On Presentation Photos Wound Measurements Length: (cm) 1.6 Width: (cm) 1.9 Depth: (cm) 0.2 Area: (cm) 2.388 Volume: (cm) 0.478 % Reduction in Area: -26.7% % Reduction in Volume: -26.8% Epithelialization: None Wound Description Classification: Grade 3 Wound Margin: Distinct, outline attached Exudate Amount: Medium Exudate Type: Serosanguineous Exudate Color: red, brown Foul Odor After Cleansing: Yes Due to Product Use: No Slough/Fibrino Yes Wound Bed Granulation Amount: None Present (0%) Exposed Structure Necrotic Amount: Large (67-100%) Fascia Exposed: No Necrotic Quality: Adherent Slough Fat Layer (Subcutaneous Tissue) Exposed: No Tendon Exposed: No Muscle Exposed: No Joint Exposed: No Bone Exposed: No Lawrence [name] ([id number]) [id number] Page 8 of 8 Treatment Notes Wound #4 (Toe Third) Wound Laterality:  Left Cleanser Soap and Water Discharge Instruction: Gently cleanse wound with antibacterial soap, rinse and pat dry prior to dressing wounds Peri-Wound Care Topical Primary Dressing Silvercel Small 2x2 (in/in) Discharge Instruction: Apply Silvercel Small 2x2 (in/in) as instructed Secondary Dressing Coverlet Latex-Free Fabric Adhesive Dressings Discharge Instruction: 1.5 x 2 Secured With Compression Wrap Compression Stockings Add-Ons Electronic Signature(s) Signed: 03/02/2023 4:21:36 PM By: Angelina Pih Signed: 03/04/2023 11:09:32 AM By: Elliot Gurney, BSN, RN, CWS, [name] RN, BSN Previous Signature: 02/27/2023 1:21:35 PM Version By: Elliot Gurney, BSN, RN, CWS, [name] RN, BSN Entered By: Angelina Pih on 03/02/2023 16:17:12 -------------------------------------------------------------------------------- Vitals Details Patient Name: Date of Service: Lawrence [name], [name] 02/27/2023 12:45 PM Medical Record Number: [id number] Patient Account Number: [id number] Date of Birth/Sex: Treating RN: [birth year] (72 y.o. Lawrence [name] Primary Care Kobi Aller: [name] Other Clinician: Referring Bula Cavalieri: Treating Tanganika Barradas/Extender: [name] in Treatment: 4 Vital Signs Time Taken: 12:42 Temperature (F): 97.7 Pulse (bpm): 65 Respiratory Rate (breaths/min): 18 Blood Pressure (mmHg): 130/80 Capillary Blood Glucose (mg/dl): 875 Reference Range: 80 - 120 mg / dl Airway Pulse Oximetry (%): 99 Electronic Signature(s) Signed: 02/27/2023 1:21:35 PM By: Elliot Gurney, BSN, RN, CWS, [name] RN, BSN Entered By: Elliot Gurney, BSN, RN, CWS, [name] on 02/27/2023 12:43:01

## 2023-02-28 NOTE — Progress Notes (Addendum)
Lawrence [name] ([id number]) [id number] Page 1 of 9 Visit Report for 02/27/2023 Chief Complaint Document Details Patient Name: Date of Service: [name], [name] 02/27/2023 12:45 PM Medical Record Number: [id number] Patient Account Number: [id number] Date of Birth/Sex: Treating RN: [birth year] (72 y.o. Lawrence [name] Primary Care Provider: [name] Other Clinician: Referring Provider: Treating Provider/Extender: [name] in Treatment: 4 Information Obtained from: Patient Chief Complaint Bilateral foot ulcers Electronic Signature(s) Signed: 02/27/2023 12:02:27 PM By: [name] Entered By: [name] on 02/27/2023 12:02:26 -------------------------------------------------------------------------------- Debridement Details Patient Name: Date of Service: [name], [name] 02/27/2023 12:45 PM Medical Record Number: [id number] Patient Account Number: [id number] Date of Birth/Sex: Treating RN: 06-Oct-1951 (73 y.o. Lawrence [name], Lawrence [name] Primary Care Provider: [name] Other Clinician: Referring Provider: Treating Provider/Extender: [name] in Treatment: 4 Debridement Performed for Assessment: Wound #4 Left T Third oe Performed By: Physician [name], PA-C Debridement Type: Debridement Severity of Tissue Pre Debridement: Fat layer exposed Level of Consciousness (Pre-procedure): Awake and Alert Pre-procedure Verification/Time Out Yes - 12:52 Taken: Percent of Wound Bed Debrided: 100% T Area Debrided (cm): otal 2.39 Tissue and other material debrided: Viable, Non-Viable, Callus, Slough, Subcutaneous, Biofilm, Slough Level: Skin/Subcutaneous Tissue Debridement Description: Excisional Instrument: Curette Bleeding: Minimum Hemostasis Achieved: Pressure Response to Treatment: Procedure was tolerated well Level of Consciousness (Post- Awake and Alert procedure): Post Debridement  Measurements of Total Wound Length: (cm) 1.6 Width: (cm) 1.9 Depth: (cm) 0.2 Volume: (cm) 0.478 Character of Wound/Ulcer Post Debridement: Stable Severity of Tissue Post Debridement: Fat layer exposed Post Procedure Diagnosis Same as Pre-procedure Electronic Signature(s) Signed: 02/27/2023 1:11:39 PM By: [name] Signed: 02/27/2023 1:21:35 PM By: Elliot Gurney, BSN, RN, CWS, [name] RN, BSN Entered By: Elliot Gurney, BSN, RN, CWS, [name] on 02/27/2023 12:56:16 Lawrence [name] ([id number]) [phone].pdf Page 2 of 9 -------------------------------------------------------------------------------- Debridement Details Patient Name: Date of Service: [name], [name] 02/27/2023 12:45 PM Medical Record Number: [id number] Patient Account Number: [id number] Date of Birth/Sex: Treating RN: 1951-01-21 (72 y.o. Lawrence [name], Lawrence [name] Primary Care Provider: [name] Other Clinician: Referring Provider: Treating Provider/Extender: [name] in Treatment: 4 Debridement Performed for Assessment: Wound #1 Right T Second oe Performed By: Physician [name], PA-C Debridement Type: Debridement Severity of Tissue Pre Debridement: Fat layer exposed Level of Consciousness (Pre-procedure): Awake and Alert Pre-procedure Verification/Time Out Yes - 12:52 Taken: Percent of Wound Bed Debrided: 100% T Area Debrided (cm): otal 0.2 Tissue and other material debrided: Viable, Non-Viable, Bone, Callus, Slough, Subcutaneous, Slough Level: Skin/Subcutaneous Tissue/Muscle/Bone Debridement Description: Excisional Instrument: Curette Bleeding: Minimum Hemostasis Achieved: Pressure Response to Treatment: Procedure was tolerated well Level of Consciousness (Post- Awake and Alert procedure): Post Debridement Measurements of Total Wound Length: (cm) 0.5 Width: (cm) 0.5 Depth: (cm) 0.4 Volume: (cm) 0.079 Character of Wound/Ulcer Post Debridement: Stable Severity of  Tissue Post Debridement: Bone involvement without necrosis Post Procedure Diagnosis Same as Pre-procedure Electronic Signature(s) Signed: 02/27/2023 1:11:39 PM By: [name] Signed: 02/27/2023 1:21:35 PM By: Elliot Gurney, BSN, RN, CWS, [name] RN, BSN Entered By: Elliot Gurney, BSN, RN, CWS, [name] on 02/27/2023 12:57:23 -------------------------------------------------------------------------------- Debridement Details Patient Name: Date of Service: Lawrence Householder MS, [name] 02/27/2023 12:45 PM Medical Record Number: [id number] Patient Account Number: [id number] Date of Birth/Sex: Treating RN: 04/12/1951 (72 y.o. Lawrence [name] Primary Care Provider: [name] Other Clinician: Referring Provider: Treating Provider/Extender: [name] in  Treatment: 4 Debridement Performed for Assessment: Wound #2 Right T Third oe Performed By: Physician [name], PA-C Debridement Type: Debridement Severity of Tissue Pre Debridement: Fat layer exposed Level of Consciousness (Pre-procedure): Awake and Alert Pre-procedure Verification/Time Out Yes - 12:52 Taken: Percent of Wound Bed Debrided: 100% T Area Debrided (cm): otal 0.52 Tissue and other material debrided: Viable, Non-Viable, Slough, Subcutaneous, Slough Level: Skin/Subcutaneous Tissue Debridement Description: Excisional Instrument: Curette Bleeding: Minimum Hemostasis Achieved: Pressure Response to Treatment: Procedure was tolerated well Level of Consciousness (Post- Awake and Alert procedure): Post Debridement Measurements of Total Wound Length: (cm) 0.6 Lawrence [name] ([id number]) [id number] Page 3 of 9 Width: (cm) 1.1 Depth: (cm) 0.1 Volume: (cm) 0.052 Character of Wound/Ulcer Post Debridement: Stable Severity of Tissue Post Debridement: Fat layer exposed Post Procedure Diagnosis Same as Pre-procedure Electronic Signature(s) Signed: 02/27/2023 1:11:39 PM By: [name] Signed:  02/27/2023 1:21:35 PM By: Elliot Gurney, BSN, RN, CWS, [name] RN, BSN Entered By: Elliot Gurney, BSN, RN, CWS, [name] on 02/27/2023 12:59:10 -------------------------------------------------------------------------------- HPI Details Patient Name: Date of Service: [name], [name] 02/27/2023 12:45 PM Medical Record Number: [id number] Patient Account Number: [id number] Date of Birth/Sex: Treating RN: 1951/05/10 (72 y.o. Lawrence [name] Primary Care Provider: [name] Other Clinician: Referring Provider: Treating Provider/Extender: [name] in Treatment: 4 History of Present Illness HPI Description: 01-30-2023 upon evaluation today patient appears to be doing poorly currently in regard to his feet bilaterally. He is actually referral from Dr. Gala Lewandowsky who is a local podiatrist. Subsequently Dr. Logan Bores has been seeing him but wanted to refer him to [place] due to the fact that he is having difficulty healing in regard to his feet bilaterally. He has had several amputations. 1 back in the summer 2023 of the great toe right foot. Subsequently had a partial digit amputation of the second toe that was performed in November 2023. Subsequently since that time he had a hard time getting this to heal. Fortunately there does not appear to be any signs of infection has been using gentamicin currently as the treatment of choice. With that being said I feel like that we need to try to see about drying some of these wounds off of it which is my biggest concern at this point. Also think that he could potentially have osteomyelitis of this right foot he has not had an x-ray of the left foot Emina suggest MRI right foot and x-ray left foot he is already had an x-ray of the right foot at Dr. Logan Bores office and that did not reveal obvious signs of osteomyelitis but he does have bone exposure in the distal portion of the second toe right foot digit. Patient does have a history significant for diabetes mellitus  type 1, hypertension, peripheral vascular disease, chronic kidney disease stage IIIa. He also has undergone arterial study which was actually performed on 12-08-2021 and this shows that he actually has good arterial flow into the right lower extremity and left lower extremity for that matter. He had noncompressible ABIs but on the left he had a TBI of 0.68 and in regard to his flow it was actually better on the right even compared to the left with good pressures at all locations and again there does not appear to be any significant peripheral vascular disease that would prevent healing. 02-06-2023 upon evaluation today patient appears to be doing about the same in regard to his wounds. There is quite a bit of maceration of the  great toe on the right foot does appear to be healed the remainder of the wounds all appear to be a little bit macerated. I think that he may need to change this dressing a little bit more frequently. We have been using a silver alginate dressing. 02-13-2023 upon evaluation today patient unfortunately is continuing to have some issues here with purulent drainage from his toes there is also an odor that both his wife and the nurse noted although I was not able to detect this due to my lack of smell. Nonetheless I do believe that this is something where we need to be a little more aggressive than the treatment of the wound to be perfectly honest. I discussed this with the patient today and he is in agreement with this plan. I am going to send in those antibiotics for him today. 02-20-2023 upon evaluation today patient appears to be doing decently well in regard to his wounds he is tolerating the antibiotics without complication and overall seems to be doing well in that regard. Fortunately I do not see any signs of active infection locally nor systemically at this time. 02-27-2023 upon evaluation today patient appears to be doing well currently in regard to his wound. He has been  tolerating the dressing changes without complication. Fortunately there does not appear to be any signs of infection we are using silver alginate dressings and then subsequently the Band-Aids to secure in place. He also seems to be doing well with the hyperbarics at this point which is great news. Electronic Signature(s) Signed: 02/27/2023 1:09:44 PM By: [name] Entered By: [name] on 02/27/2023 13:09:43 -------------------------------------------------------------------------------- Physical Exam Details Patient Name: Date of Service: [name], [name] 02/27/2023 12:45 PM Medical Record Number: [id number] Patient Account Number: [id number] Date of Birth/Sex: Treating RN: [birth year] (72 y.o. Lawrence [name] Primary Care Provider: [name] Other Clinician: Referring Provider: Treating Provider/Extender: [name] in Treatment: 4 Constitutional Pralle, [name] ([id number]) [id number] Page 4 of 9 Well-nourished and well-hydrated in no acute distress. Respiratory normal breathing without difficulty. Psychiatric this patient is able to make decisions and demonstrates good insight into disease process. Alert and Oriented x 3. pleasant and cooperative. Notes Upon inspection patient's wound bed did require sharp debridement pretty much across the board to clearway the necrotic debris he tolerated this today without complication and postdebridement the wound bed actually appears to be doing much better which is great news I am very pleased in that regard. I did have to perform silver nitrate for chemical cauterization of the third toe right foot in order to achieve hemostasis otherwise he did extremely well. I think this is can help this area to heal much more effectively however. Electronic Signature(s) Signed: 02/27/2023 1:10:04 PM By: [name] Entered By: [name] on 02/27/2023  13:10:04 -------------------------------------------------------------------------------- Physician Orders Details Patient Name: Date of Service: [name], [name] 02/27/2023 12:45 PM Medical Record Number: [id number] Patient Account Number: [id number] Date of Birth/Sex: Treating RN: 16-Feb-1951 (72 y.o. Lawrence [name], Lawrence [name] Primary Care Provider: [name] Other Clinician: Referring Provider: Treating Provider/Extender: [name] in Treatment: 4 Verbal / Phone Orders: No Diagnosis Coding ICD-10 Coding Code Description M86.371 Chronic multifocal osteomyelitis, right ankle and foot E10.621 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 L97.522 Non-pressure chronic ulcer of other part of left foot with fat layer exposed L97.512 Non-pressure chronic ulcer of other part of right foot with fat  layer exposed I25.10 Atherosclerotic heart disease of native coronary artery without angina pectoris I10 Essential (primary) hypertension I73.89 Other specified peripheral vascular diseases N18.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, stage 3a Follow-up Appointments Return Appointment in 1 week. Anesthetic (Use 'Patient Medications' Section for Anesthetic Order Entry) Lidocaine applied to wound bed Edema Control - Lymphedema / Segmental Compressive Device / Other Elevate, Exercise Daily and A void Standing for Long Periods of Time. Elevate legs to the level of the heart and pump ankles as often as possible Elevate leg(s) parallel to the floor when sitting. Off-Loading Open toe surgical shoe - right and left foot Hyperbaric Oxygen Therapy Wound #1 Right T Second oe Evaluate for HBO Therapy Indication and location: - Multifocal bilateral foot osteomyelitis with Wagoner Grade 3 ulcers If appropriate for treatment, begin HBOT per protocol: 2.0 ATA for 90 Minutes without A Breaks ir One treatment per day (delivered Monday through Friday unless otherwise specified in Special Instructions  below): Total # of Treatments: - 40 A ntihistamine 30 minutes prior to HBO Treatment, difficulty clearing ears. Finger stick Blood Glucose Pre- and Post- HBOT Treatment. Follow Hyperbaric Oxygen Glycemia Protocol Wound #2 Right T Third oe Evaluate for HBO Therapy Indication and location: - Multifocal bilateral foot osteomyelitis with Wagoner Grade 3 ulcers If appropriate for treatment, begin HBOT per protocol: 2.0 ATA for 90 Minutes without A Breaks ir One treatment per day (delivered Monday through Friday unless otherwise specified in Special Instructions below): Total # of Treatments: - [address] ([id number]) [id number] Page 5 of 9 Antihistamine 30 minutes prior to HBO Treatment, difficulty clearing ears. Finger stick Blood Glucose Pre- and Post- HBOT Treatment. Follow Hyperbaric Oxygen Glycemia Protocol Wound #4 Left T Third oe Evaluate for HBO Therapy Indication and location: - Multifocal bilateral foot osteomyelitis with Wagoner Grade 3 ulcers If appropriate for treatment, begin HBOT per protocol: 2.0 ATA for 90 Minutes without A Breaks ir One treatment per day (delivered Monday through Friday unless otherwise specified in Special Instructions below): Total # of Treatments: - 40 A ntihistamine 30 minutes prior to HBO Treatment, difficulty clearing ears. Finger stick Blood Glucose Pre- and Post- HBOT Treatment. Follow Hyperbaric Oxygen Glycemia Protocol Wound Treatment Wound #1 - T Second oe Wound Laterality: Right Cleanser: Soap and Water 1 x Per Day/30 Days Discharge Instructions: Gently cleanse wound with antibacterial soap, rinse and pat dry prior to dressing wounds Prim Dressing: Silvercel Small 2x2 (in/in) 1 x Per Day/30 Days ary Discharge Instructions: Apply Silvercel Small 2x2 (in/in) as instructed Secondary Dressing: Coverlet Latex-Free Fabric Adhesive Dressings 1 x Per Day/30 Days Discharge Instructions: 1.5 x 2 Wound #2 - T  Third oe Wound Laterality: Right Cleanser: Soap and Water 1 x Per Day/30 Days Discharge Instructions: Gently cleanse wound with antibacterial soap, rinse and pat dry prior to dressing wounds Prim Dressing: Silvercel Small 2x2 (in/in) 1 x Per Day/30 Days ary Discharge Instructions: Apply Silvercel Small 2x2 (in/in) as instructed Secondary Dressing: Coverlet Latex-Free Fabric Adhesive Dressings 1 x Per Day/30 Days Discharge Instructions: 1.5 x 2 Wound #4 - T Third oe Wound Laterality: Left Cleanser: Soap and Water 1 x Per Day/30 Days Discharge Instructions: Gently cleanse wound with antibacterial soap, rinse and pat dry prior to dressing wounds Prim Dressing: Silvercel Small 2x2 (in/in) 1 x Per Day/30 Days ary Discharge Instructions: Apply Silvercel Small 2x2 (in/in) as instructed Secondary Dressing: Coverlet Latex-Free Fabric Adhesive Dressings 1 x Per Day/30 Days Discharge Instructions: 1.5 x 2 GLYCEMIA INTERVENTIONS PROTOCOL PRE-HBO GLYCEMIA INTERVENTIONS  ACTION INTERVENTION Obtain pre-HBO capillary blood glucose (ensure 1 physician order is in chart). A. Notify HBO physician and await physician orders. 2 If result is 70 mg/dl or below: B. If the result meets the hospital definition of a critical result, follow hospital policy. A. Give patient an 8 ounce Glucerna Shake, an 8 ounce Ensure, or 8 ounces of a Glucerna/Ensure equivalent dietary supplement*. B. Wait 30 minutes. If result is 71 mg/dl to 161 mg/dl: C. Retest patients capillary blood glucose (CBG). D. If result greater than or equal to 110 mg/dl, proceed with HBO. If result less than 110 mg/dl, notify HBO physician and consider holding HBO. If result is 131 mg/dl to 096 mg/dl: A. Proceed with HBO. A. Notify HBO physician and await physician orders. B. It is recommended to hold HBO and do If result is 250 mg/dl or greater: blood/urine ketone testing. C. If the result meets the hospital definition of  a critical result, follow hospital policy. POST-HBO GLYCEMIA INTERVENTIONS ACTION INTERVENTION Obtain post HBO capillary blood glucose (ensure 1 physician order is in chart). A. Notify HBO physician and await physician orders. 2 If result is 70 mg/dl or below: B. If the result meets the hospital definition of a SELESTINO, NILA ([id number]) [phone].pdf Page 6 of 9 critical result, follow hospital policy. A. Give patient an 8 ounce Glucerna Shake, an 8 ounce Ensure, or 8 ounces of a Glucerna/Ensure equivalent dietary supplement*. B. Wait 15 minutes for symptoms of If result is 71 mg/dl to 401 mg/dl: hypoglycemia (i.e. nervousness, anxiety, sweating, chills, clamminess, irritability, confusion, tachycardia or dizziness). C. If patient asymptomatic, discharge patient. If patient symptomatic, repeat capillary blood glucose (CBG) and notify HBO physician. If result is 101 mg/dl to 027 mg/dl: A. Discharge patient. A. Notify HBO physician and await physician orders. B. It is recommended to do blood/urine ketone If result is 250 mg/dl or greater: testing. C. If the result meets the hospital definition of a critical result, follow hospital policy. *Juice or candies are NOT equivalent products. If patient refuses the Glucerna or Ensure, please consult the hospital dietitian for an appropriate substitute. Electronic Signature(s) Signed: 02/27/2023 1:11:39 PM By: [name] Signed: 02/27/2023 1:21:35 PM By: Elliot Gurney, BSN, RN, CWS, [name] RN, BSN Entered By: Elliot Gurney, BSN, RN, CWS, [name] on 02/27/2023 13:10:51 -------------------------------------------------------------------------------- Problem List Details Patient Name: Date of Service: Lawrence Householder MS, [name] 02/27/2023 12:45 PM Medical Record Number: [id number] Patient Account Number: [id number] Date of Birth/Sex: Treating RN: June 03, 1951 (72 y.o. Lawrence [name], Lawrence [name] Primary Care Provider: [name] Other  Clinician: Referring Provider: Treating Provider/Extender: [name] in Treatment: 4 Active Problems ICD-10 Encounter Code Description Active Date MDM Diagnosis M86.371 Chronic multifocal osteomyelitis, right ankle and foot 02/13/2023 No Yes E10.621 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 01/30/2023 No Yes L97.522 Non-pressure chronic ulcer of other part of left foot with fat layer exposed 01/30/2023 No Yes L97.512 Non-pressure chronic ulcer of other part of right foot with fat layer exposed 01/30/2023 No Yes I25.10 Atherosclerotic heart disease of native coronary artery without angina pectoris 01/30/2023 No Yes I10 Essential (primary) hypertension 01/30/2023 No Yes I73.89 Other specified peripheral vascular diseases 01/30/2023 No Yes N18.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, stage 3a 01/30/2023 No Yes Lawrence [name] ([id number]) [id number].pdf Page 7 of 9 Inactive Problems Resolved Problems Electronic Signature(s) Signed: 02/27/2023 12:02:24 PM By: [name] Entered By: [name] on 02/27/2023 12:02:23 -------------------------------------------------------------------------------- Progress Note Details Patient Name: Date of Service: [name],  [name] 02/27/2023 12:45 PM Medical Record Number: [id number] Patient Account Number: [id number] Date of Birth/Sex: Treating RN: 06-24-1951 (72 y.o. Lawrence [name] Primary Care Provider: [name] Other Clinician: Referring Provider: Treating Provider/Extender: [name] in Treatment: 4 Subjective Chief Complaint Information obtained from Patient Bilateral foot ulcers History of Present Illness (HPI) 01-30-2023 upon evaluation today patient appears to be doing poorly currently in regard to his feet bilaterally. He is actually referral from Dr. Gala Lewandowsky who is a local podiatrist. Subsequently Dr. Logan Bores has been seeing him but wanted to refer him to [place] due to the  fact that he is having difficulty healing in regard to his feet bilaterally. He has had several amputations. 1 back in the summer 2023 of the great toe right foot. Subsequently had a partial digit amputation of the second toe that was performed in November 2023. Subsequently since that time he had a hard time getting this to heal. Fortunately there does not appear to be any signs of infection has been using gentamicin currently as the treatment of choice. With that being said I feel like that we need to try to see about drying some of these wounds off of it which is my biggest concern at this point. Also think that he could potentially have osteomyelitis of this right foot he has not had an x-ray of the left foot Emina suggest MRI right foot and x-ray left foot he is already had an x-ray of the right foot at Dr. Logan Bores office and that did not reveal obvious signs of osteomyelitis but he does have bone exposure in the distal portion of the second toe right foot digit. Patient does have a history significant for diabetes mellitus type 1, hypertension, peripheral vascular disease, chronic kidney disease stage IIIa. He also has undergone arterial study which was actually performed on 12-08-2021 and this shows that he actually has good arterial flow into the right lower extremity and left lower extremity for that matter. He had noncompressible ABIs but on the left he had a TBI of 0.68 and in regard to his flow it was actually better on the right even compared to the left with good pressures at all locations and again there does not appear to be any significant peripheral vascular disease that would prevent healing. 02-06-2023 upon evaluation today patient appears to be doing about the same in regard to his wounds. There is quite a bit of maceration of the great toe on the right foot does appear to be healed the remainder of the wounds all appear to be a little bit macerated. I think that he may need to change  this dressing a little bit more frequently. We have been using a silver alginate dressing. 02-13-2023 upon evaluation today patient unfortunately is continuing to have some issues here with purulent drainage from his toes there is also an odor that both his wife and the nurse noted although I was not able to detect this due to my lack of smell. Nonetheless I do believe that this is something where we need to be a little more aggressive than the treatment of the wound to be perfectly honest. I discussed this with the patient today and he is in agreement with this plan. I am going to send in those antibiotics for him today. 02-20-2023 upon evaluation today patient appears to be doing decently well in regard to his wounds he is tolerating the antibiotics without complication and overall seems to be  doing well in that regard. Fortunately I do not see any signs of active infection locally nor systemically at this time. 02-27-2023 upon evaluation today patient appears to be doing well currently in regard to his wound. He has been tolerating the dressing changes without complication. Fortunately there does not appear to be any signs of infection we are using silver alginate dressings and then subsequently the Band-Aids to secure in place. He also seems to be doing well with the hyperbarics at this point which is great news. Objective Constitutional Well-nourished and well-hydrated in no acute distress. Vitals Time Taken: 12:42 PM, Temperature: 97.7 F, Pulse: 65 bpm, Respiratory Rate: 18 breaths/min, Blood Pressure: 130/80 mmHg, Capillary Blood Glucose: 156 mg/dl, Pulse Oximetry: 99 %. Respiratory normal breathing without difficulty. Psychiatric this patient is able to make decisions and demonstrates good insight into disease process. Alert and Oriented x 3. pleasant and cooperative. General Notes: Upon inspection patient's wound bed did require sharp debridement pretty much across the board to clearway  the necrotic debris he tolerated Lawrence [name] ([id number]) [phone].pdf Page 8 of 9 this today without complication and postdebridement the wound bed actually appears to be doing much better which is great news I am very pleased in that regard. I did have to perform silver nitrate for chemical cauterization of the third toe right foot in order to achieve hemostasis otherwise he did extremely well. I think this is can help this area to heal much more effectively however. Integumentary (Hair, Skin) Wound #1 status is Open. Original cause of wound was Gradually Appeared. The date acquired was: 11/03/2022. The wound has been in treatment 4 weeks. The wound is located on the Right T Second. The wound measures 0.5cm length x 0.4cm width x 0.3cm depth; 0.157cm^2 area and 0.047cm^3 volume. There is a oe medium amount of serosanguineous drainage noted. Wound #2 status is Open. Original cause of wound was Gradually Appeared. The date acquired was: 11/03/2022. The wound has been in treatment 4 weeks. The wound is located on the Right T Third. The wound measures 0.6cm length x 1.1cm width x 0.1cm depth; 0.518cm^2 area and 0.052cm^3 volume. There is a oe medium amount of serosanguineous drainage noted. Wound #4 status is Open. Original cause of wound was Gradually Appeared. The date acquired was: 12/05/2022. The wound has been in treatment 4 weeks. The wound is located on the Left T Third. The wound measures 1.6cm length x 1.9cm width x 0.2cm depth; 2.388cm^2 area and 0.478cm^3 volume. There is a oe medium amount of serosanguineous drainage noted. 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 There was a Excisional Skin/Subcutaneous Tissue/Muscle/Bone Debridement with a total area of 0.2 sq cm performed by [name], PA-C. With the following instrument(s): Curette to remove Viable and Non-Viable tissue/material. Material removed includes Bone,Callus, Subcutaneous Tissue, and Slough. No specimens were taken. A time out was conducted at 12:52, prior to the start of the procedure. A Minimum amount of bleeding was  controlled with Pressure. The procedure was tolerated well. Post Debridement Measurements: 0.5cm length x 0.5cm width x 0.4cm depth; 0.079cm^3 volume. Character of Wound/Ulcer Post Debridement is stable. Severity of Tissue Post Debridement is: Bone involvement without necrosis. 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 There was a Excisional Skin/Subcutaneous Tissue Debridement with a total area of 0.52 sq cm performed by [name], PA-C. With the following instrument(s): Curette to remove Viable and Non-Viable tissue/material. Material removed includes Subcutaneous Tissue and Slough and. No specimens were taken. A time out was conducted at 12:52, prior to the start of the procedure. A Minimum amount of bleeding was controlled with Pressure. The procedure was tolerated well. Post Debridement Measurements: 0.6cm length x 1.1cm width x 0.1cm depth; 0.052cm^3 volume. Character of Wound/Ulcer Post Debridement is stable. Severity of Tissue Post  Debridement is: Fat layer exposed. Post procedure Diagnosis Wound #2: Same as Pre-Procedure Wound #4 Pre-procedure diagnosis of Wound #4 is a Diabetic Wound/Ulcer of the Lower Extremity located on the Left T Third .Severity of Tissue Pre Debridement is: oe Fat layer exposed. There was a Excisional Skin/Subcutaneous Tissue Debridement with a total area of 2.39 sq cm performed by [name], PA-C. With the following instrument(s): Curette to remove Viable and Non-Viable tissue/material. Material removed includes Callus, Subcutaneous Tissue, Slough, and Biofilm. No specimens were taken. A time out was conducted at 12:52, prior to the start of the procedure. A Minimum amount of bleeding was controlled with Pressure. The procedure was tolerated well. Post Debridement Measurements: 1.6cm length x 1.9cm width x 0.2cm depth; 0.478cm^3 volume. Character of Wound/Ulcer Post Debridement is stable. Severity of Tissue Post Debridement is: Fat layer exposed. Post procedure Diagnosis Wound #4: Same as Pre-Procedure Plan 1. Would recommend currently that we have the patient continue to monitor for any signs of infection or worsening. Based on what I am seeing I do believe that we are moving in the right direction. 2. I am going to suggest that the patient should continue with the silver alginate dressings which I think are doing a good job with securing this with Band-Aids. 3. He will also continue to change this on a daily basis. 4. Would not continue as well with the hyperbarics which I think is going to be beneficial in helping to achieve the closure that were looking for as far as the wounds are concerned again the goal is to prevent amputation. We will see patient back for reevaluation in 1 week here in the clinic. If anything worsens or changes patient will contact our office for additional recommendations. Lawrence [name] ([id number]) [id number] Page 9 of 9 Electronic  Signature(s) Signed: 02/27/2023 1:10:41 PM By: [name] Entered By: [name] on 02/27/2023 13:10:40 -------------------------------------------------------------------------------- SuperBill Details Patient Name: Date of Service: [name], [name] 02/27/2023 Medical Record Number: [id number] Patient Account Number: [id number] Date of Birth/Sex: Treating RN: 1951-08-07 (72 y.o. 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 L97.522 Non-pressure chronic ulcer of other part of left foot with fat layer exposed L97.512 Non-pressure chronic ulcer of other part of right foot with  fat layer exposed I25.10 Atherosclerotic heart disease of native coronary artery without angina pectoris I10 Essential (primary) hypertension I73.89 Other specified peripheral vascular diseases N18.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, stage 3a Facility Procedures : CPT4 Code: 16109604 1 Description: 1042 - DEB SUBQ TISSUE 20 SQ CM/< ICD-10 Diagnosis Description 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 Modifier: Quantity: 1 : CPT4 Code: 54098119 1 Description: 1044 - DEB BONE 20 SQ CM/< ICD-10 Diagnosis Description L97.512 Non-pressure chronic ulcer of other part of right foot with fat layer exposed Modifier: Quantity: 1 Physician Procedures : CPT4: Description Modifier Code [id number] 11042 - WC PHYS SUBQ TISS 20 SQ CM ICD-10 Diagnosis Description L97.522 Non-pressure chronic ulcer of other part of left foot with fat layer exposed L97.512 Non-pressure chronic ulcer of other part of right foot  with fat  layer exposed Quantity: 1 : CPT4: 6213086 Debridement; bone (includes epidermis, dermis, subQ tissue, muscle and/or fascia, if performed) 1st 20 sqcm or less ICD-10 Diagnosis Description L97.512 Non-pressure chronic ulcer of other part of right foot with fat layer exposed Quantity: 1 Electronic Signature(s) Signed: 02/27/2023 1:11:00 PM By: [name] Entered By: [name] on 02/27/2023 13:10:59

## 2023-03-02 ENCOUNTER — Encounter: Payer: Medicare Other | Admitting: Physician Assistant

## 2023-03-02 ENCOUNTER — Telehealth: Payer: Self-pay | Admitting: *Deleted

## 2023-03-02 DIAGNOSIS — E10621 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: Secondary | ICD-10-CM | POA: Diagnosis not present

## 2023-03-02 LAB — GLUCOSE, CAPILLARY
Glucose-Capillary: 174 mg/dL — ABNORMAL HIGH (ref 70–99)
Glucose-Capillary: 143 mg/dL — ABNORMAL HIGH (ref 70–99)

## 2023-03-02 NOTE — Telephone Encounter (Signed)
Called [facility] to schedule Tubes to be placed in patients ear. Per Provider [name]. Patient complains of headaches, blocked ears. Pt currently is doing Hyperbaric Therapy treatments.

## 2023-03-03 ENCOUNTER — Encounter: Payer: Medicare Other | Admitting: Physician Assistant

## 2023-03-03 DIAGNOSIS — E10621 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: Secondary | ICD-10-CM | POA: Diagnosis not present

## 2023-03-03 LAB — GLUCOSE, CAPILLARY
Glucose-Capillary: 100 mg/dL — ABNORMAL HIGH (ref 70–99)
Glucose-Capillary: 117 mg/dL — ABNORMAL HIGH (ref 70–99)

## 2023-03-04 ENCOUNTER — Encounter: Payer: Medicare Other | Attending: Internal Medicine | Admitting: Internal Medicine

## 2023-03-04 DIAGNOSIS — Z89421 Acquired absence of other right toe(s): Secondary | ICD-10-CM | POA: Insufficient documentation

## 2023-03-04 DIAGNOSIS — E10621 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: Secondary | ICD-10-CM | POA: Diagnosis present

## 2023-03-04 DIAGNOSIS — I2510 Atherosclerotic heart disease of native coronary artery without angina pectoris: Secondary | ICD-10-CM | POA: Diagnosis not present

## 2023-03-04 DIAGNOSIS — M86371 Chronic multifocal osteomyelitis, right ankle and foot: Secondary | ICD-10-CM | POA: Diagnosis not present

## 2023-03-04 DIAGNOSIS — I120 Hypertensive chronic kidney disease with stage 5 chronic kidney disease or end stage renal disease: Secondary | ICD-10-CM | POA: Diagnosis not present

## 2023-03-04 DIAGNOSIS — L97512 Non-pressure chronic ulcer of other part of right foot with fat layer exposed: Secondary | ICD-10-CM | POA: Diagnosis not present

## 2023-03-04 DIAGNOSIS — Z794 Long term (current) use of insulin: Secondary | ICD-10-CM | POA: Insufficient documentation

## 2023-03-04 DIAGNOSIS — E1051 Type 1 diabetes mellitus with diabetic peripheral angiopathy without gangrene: Secondary | ICD-10-CM | POA: Diagnosis not present

## 2023-03-04 DIAGNOSIS — L97522 Non-pressure chronic ulcer of other part of left foot with fat layer exposed: Secondary | ICD-10-CM | POA: Insufficient documentation

## 2023-03-04 DIAGNOSIS — E1022 Type 1 diabetes mellitus with diabetic chronic kidney disease: Secondary | ICD-10-CM | POA: Insufficient documentation

## 2023-03-04 DIAGNOSIS — N186 End stage renal disease: Secondary | ICD-10-CM | POA: Insufficient documentation

## 2023-03-04 DIAGNOSIS — Z89411 Acquired absence of right great toe: Secondary | ICD-10-CM | POA: Diagnosis not present

## 2023-03-04 LAB — GLUCOSE, CAPILLARY
Glucose-Capillary: 210 mg/dL — ABNORMAL HIGH (ref 70–99)
Glucose-Capillary: 189 mg/dL — ABNORMAL HIGH (ref 70–99)

## 2023-03-05 ENCOUNTER — Encounter: Payer: Medicare Other | Admitting: Physician Assistant

## 2023-03-05 DIAGNOSIS — E10621 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: Secondary | ICD-10-CM | POA: Diagnosis not present

## 2023-03-05 LAB — GLUCOSE, CAPILLARY
Glucose-Capillary: 230 mg/dL — ABNORMAL HIGH (ref 70–99)
Glucose-Capillary: 155 mg/dL — ABNORMAL HIGH (ref 70–99)

## 2023-03-05 NOTE — Progress Notes (Signed)
[name] ([id number]) [id number] Page 1 of 2 Visit Report for 02/26/2023 HBO Details Patient Name: Date of Service: [name], [name] 02/26/2023 1:30 PM Medical Record Number: [id number] Patient Account Number: [id number] Date of Birth/Sex: Treating RN: [birth year] (72 y.o. M) Primary Care Sherrel Ploch: [name] Other Clinician: Referring Olita Takeshita: Treating Shaterria Sager/Extender: [name] in Treatment: 3 HBO Treatment Course Details Treatment Course Number: 1 Ordering Camiah Humm: [name] T Treatments Ordered: otal 40 HBO Treatment Start Date: 02/24/2023 HBO Indication: Other (specify in Notes) HBO Treatment Details Treatment Number: 2 Patient Type: Outpatient Chamber Type: Monoplace Chamber Serial #: [id number] Treatment Protocol: 2.0 ATA with 90 minutes oxygen, and no air breaks Treatment Details Compression Rate Down: 2.0 psi / minute De-Compression Rate Up: 1.0 psi / minute Air breaks and breathing Decompress Decompress Compress Tx Pressure Begins Reached periods Begins Ends (leave unused spaces blank) Chamber Pressure (ATA 1 2 ------2 1 ) Clock Time (24 hr) 1:45 2:09 - - - - - - 3:39 3:43 Treatment Length: 118 (minutes) Treatment Segments: 4 Vital Signs Capillary Blood Glucose Reference Range: 80 - 120 mg / dl HBO Diabetic Blood Glucose Intervention Range: <131 mg/dl or >782 mg/dl Type: Time Vitals Blood Respiratory Capillary Blood Glucose Pulse Action Pulse: Temperature: Taken: Pressure: Rate: Glucose (mg/dl): Meter #: Oximetry (%) Taken: Pre 01:56 122/78 65 18 98.4 335 1 99 none per protocol Post 03:53 138/80 65 18 98.4 264 1 97 none per protocol Treatment Response Treatment Toleration: Well Treatment Completion Status: Treatment Completed without Adverse Event Electronic Signature(s) Signed: 02/26/2023 5:47:58 PM By: [name] Signed: 03/04/2023 9:47:58 AM By: [name] Entered By: [name] on 02/26/2023 16:35:28 -------------------------------------------------------------------------------- HBO Safety Checklist Details Patient Name: Date of Service: [name], [name] 02/26/2023 1:30 PM Medical Record Number: [id number] Patient Account Number: [id number] Date of Birth/Sex: Treating RN: 1951-05-14 (72 y.o. M) Primary Care Minah Axelrod: [name] Other Clinician: Referring Ashyr Hedgepath: Treating Edda Orea/Extender: [name] in Treatment: 3 HBO Safety Checklist Items Safety Checklist Consent Form Signed JLEN, WINTLE ([id number]) [id number] Page 2 of 2 Patient voided / foley secured and emptied When did you last eato 02/26/2023 am Last dose of injectable or oral agent 02/26/23 glucagon Ostomy pouch emptied and vented if applicable NA All implantable devices assessed, documented and approved NA Intravenous access site secured and place NA Valuables secured Linens and cotton and cotton/polyester blend (less than 51% polyester) Personal oil-based products / skin lotions / body lotions removed Wigs or hairpieces removed NA Smoking or tobacco materials removed NA Books / newspapers / magazines / loose paper removed Cologne, aftershave, perfume and deodorant removed Jewelry removed (may wrap wedding band) Make-up removed NA Hair care products removed Battery operated devices (external) removed NA Heating patches and chemical warmers removed NA Titanium eyewear removed NA Nail polish cured greater than 10 hours NA Casting material cured greater than 10 hours NA Hearing aids removed NA Loose dentures or partials removed NA Prosthetics have been removed NA Patient demonstrates correct use of air break device (if applicable) Patient concerns have been addressed Patient grounding bracelet on and cord attached to chamber Specifics for Inpatients (complete in addition to above) Medication sheet sent with  patient Intravenous medications needed or due during therapy sent with patient Drainage tubes (e.g. nasogastric tube or chest tube secured and vented) Endotracheal or Tracheotomy tube secured Cuff deflated of air and inflated with saline Airway suctioned Electronic Signature(s) Signed: 03/04/2023  9:47:58 AM By: [name] Entered By: [name] on 02/26/2023 16:33:20

## 2023-03-05 NOTE — Progress Notes (Signed)
Lawrence [name] ([id number]) [id number] Page 1 of 2 Visit Report for 03/02/2023 Arrival Information Details Patient Name: Date of Service: A Lawrence [name], [name] 03/02/2023 1:30 PM Medical Record Number: [id number] Patient Account Number: [id number] Date of Birth/Sex: Treating RN: [birth year] (72 y.o. M) Primary Care Loukas Antonson: [name] Other Clinician: Referring Parilee Hally: Treating Lawrence [name]/Extender: [name] in Treatment: 4 Visit Information History Since Last Visit Added or deleted any medications: No Patient Arrived: Lawrence [name] Any new allergies or adverse reactions: No Arrival Time: 15:53 Had a fall or experienced change in No Accompanied By: self activities of daily living that may affect Transfer Assistance: None risk of falls: Patient Requires Transmission-Based No Signs or symptoms of abuse/neglect since last visito No Precautions: Hospitalized since last visit: No Patient Has Alerts: Yes Implantable device outside of the clinic excluding No Patient Alerts: Patient on Blood Thinner cellular tissue based products placed in the center DIABETIC TYPE 1 since last visit: ABI L [place] TBI .68 10/07/22 Pain Present Now: No ABI R [place] 10/07/22 Electronic Signature(s) Signed: 03/04/2023 9:47:58 AM By: [name] Entered By: [name] on 03/02/2023 16:36:29 -------------------------------------------------------------------------------- Encounter Discharge Information Details Patient Name: Date of Service: A Lawrence [name], [name] 03/02/2023 1:30 PM Medical Record Number: [id number] Patient Account Number: [id number] Date of Birth/Sex: Treating RN: 03/22/1951 (72 y.o. M) Primary Care Lawrence [name]: [name] Other Clinician: Referring Jauna Raczynski: Treating Charene Mccallister/Extender: [name] in Treatment: 4 Encounter Discharge Information Items Discharge Condition: Stable Ambulatory Status: Cane Discharge  Destination: Home Transportation: Private Auto Accompanied By: self Schedule Follow-up Appointment: Yes Clinical Summary of Care: Electronic Signature(s) Signed: 03/04/2023 9:47:58 AM By: [name] Entered By: [name] on 03/02/2023 16:36:54 -------------------------------------------------------------------------------- Vitals Details Patient Name: Date of Service: A Lawrence [name], [name] 03/02/2023 1:30 PM Medical Record Number: [id number] Patient Account Number: [id number] Date of Birth/Sex: Treating RN: May 28, 1951 (72 y.o. M) Primary Care [name]: [name] Other Clinician: Referring Lawrence [name]: Treating [name]/Extender: [name] in Treatment: 4 Vital Signs Time Taken: 01:40 Temperature (F): 98.0 Pulse (bpm): [address] ([id number]) [id number] Page 2 of 2 Respiratory Rate (breaths/min): 18 Blood Pressure (mmHg): 134/80 Capillary Blood Glucose (mg/dl): 244 Reference Range: 80 - 120 mg / dl Airway Pulse Oximetry (%): 99 Electronic Signature(s) Signed: 03/04/2023 9:47:58 AM By: [name] Entered By: [name] on 03/02/2023 16:36:32

## 2023-03-05 NOTE — Progress Notes (Signed)
Lawrence [name] ([id number]) [id number] Page 1 of 2 Visit Report for 03/04/2023 HBO Details Patient Name: Date of Service: [name], [name] 03/04/2023 10:00 A M Medical Record Number: [id number] Patient Account Number: [id number] Date of Birth/Sex: Treating RN: 1951/02/27 (72 y.o. M) Primary Care Lawrence [name]: Lawrence [name] Other Clinician: Referring Lawrence [name]: Treating Lawrence [name]/Extender: Lawrence [name] in Treatment: 4 HBO Treatment Course Details Treatment Course Number: 1 Ordering Lawrence [name]: Lawrence [name] Treatments Ordered: otal 40 HBO Treatment Start Date: 02/24/2023 HBO Indication: Other (specify in Notes) HBO Treatment Details Treatment Number: 5 Patient Type: Outpatient Chamber Type: Monoplace Chamber Serial #: [id number] Treatment Protocol: 2.0 ATA with 90 minutes oxygen, and no air breaks Treatment Details Compression Rate Down: 2.0 psi / minute De-Compression Rate Up: 1.5 psi / minute Air breaks and breathing Decompress Decompress Compress Tx Pressure Begins Reached periods Begins Ends (leave unused spaces blank) Chamber Pressure (ATA 1 2 ------2 1 ) Clock Time (24 hr) 10:00 10:17 - - - - - - 11:42 11:52 Treatment Length: 112 (minutes) Treatment Segments: 4 Vital Signs Capillary Blood Glucose Reference Range: 80 - 120 mg / dl HBO Diabetic Blood Glucose Intervention Range: <131 mg/dl or >295 mg/dl Type: Time Vitals Blood Respiratory Capillary Blood Glucose Pulse Action Pulse: Temperature: Taken: Pressure: Rate: Glucose (mg/dl): Meter #: Oximetry (%) Taken: Pre 10:00 124/84 63 18 97.9 210 1 99 none per protocol Post 11:52 130/80 60 18 98 187 1 99 none per protocol Treatment Response Treatment Toleration: Well Treatment Completion Status: Treatment Completed without Adverse Event HBO Attestation I certify that I supervised this HBO treatment in accordance with Medicare guidelines. A trained emergency response  team is readily available per Yes hospital policies and procedures. Continue HBOT as ordered. Yes Electronic Signature(s) Signed: 03/04/2023 12:46:53 PM By: Lawrence Corwin DO Previous Signature: 03/04/2023 12:27:48 PM Version By: [name] Entered By: Lawrence [name] on 03/04/2023 12:46:42 -------------------------------------------------------------------------------- HBO Safety Checklist Details Patient Name: Date of Service: [name], [name] 03/04/2023 10:00 A M Medical Record Number: [id number] Patient Account Number: [id number] Date of Birth/Sex: Treating RN: 02/23/1951 (72 y.o. M) Primary Care Lawrence [name]: Lawrence [name] Other Clinician: Referring Lawrence [name]: Treating Lawrence [name]/Extender: Lawrence [name] Lawrence [name] ([id number]) [id number] Page 2 of 2 Weeks in Treatment: 4 HBO Safety Checklist Items Safety Checklist Consent Form Signed Patient voided / foley secured and emptied When did you last eato 03/04/2023 Last dose of injectable or oral agent 03/04/2023 gluagon Ostomy pouch emptied and vented if applicable NA All implantable devices assessed, documented and approved NA Intravenous access site secured and place NA Valuables secured Linens and cotton and cotton/polyester blend (less than 51% polyester) Personal oil-based products / skin lotions / body lotions removed Wigs or hairpieces removed NA Smoking or tobacco materials removed NA Books / newspapers / magazines / loose paper removed Cologne, aftershave, perfume and deodorant removed Jewelry removed (may wrap wedding band) Make-up removed NA Hair care products removed Battery operated devices (external) removed NA Heating patches and chemical warmers removed NA Titanium eyewear removed NA Nail polish cured greater than 10 hours NA Casting material cured greater than 10 hours NA Hearing aids removed NA Loose dentures or partials removed NA Prosthetics have  been removed NA Patient demonstrates correct use of air break device (if applicable) Patient concerns have been addressed Patient grounding bracelet on and cord attached to chamber Specifics for Inpatients (complete in addition to above) Medication sheet sent with patient Intravenous medications needed  or due during therapy sent with patient Drainage tubes (e.g. nasogastric tube or chest tube secured and vented) Endotracheal or Tracheotomy tube secured Cuff deflated of air and inflated with saline Airway suctioned Electronic Signature(s) Signed: 03/04/2023 12:27:48 PM By: [name] Entered By: [name] on 03/04/2023 12:21:40

## 2023-03-05 NOTE — Progress Notes (Signed)
[name] ([id number]) [id number] Page 1 of 2 Visit Report for 03/04/2023 Arrival Information Details Patient Name: Date of Service: [name], [name] 03/04/2023 10:00 A M Medical Record Number: [id number] Patient Account Number: [id number] Date of Birth/Sex: Treating RN: Mar 21, 1951 (72 y.o. M) Primary Care Viney Acocella: [name] Other Clinician: Referring Donaciano Range: Treating Elide Stalzer/Extender: Emilee Hero in Treatment: 4 Visit Information History Since Last Visit Added or deleted any medications: No Patient Arrived: [name] Any new allergies or adverse reactions: No Arrival Time: 12:19 Had a fall or experienced change in No Accompanied By: self activities of daily living that may affect Transfer Assistance: None risk of falls: Patient Identification Verified: Yes Signs or symptoms of abuse/neglect since last visito No Secondary Verification Process Completed: Yes Hospitalized since last visit: No Patient Requires Transmission-Based Precautions: No Implantable device outside of the clinic excluding No Patient Has Alerts: Yes cellular tissue based products placed in the center since last visit: Pain Present Now: No Electronic Signature(s) Signed: 03/04/2023 12:27:48 PM By: [name] Entered By: [name] on 03/04/2023 12:19:35 -------------------------------------------------------------------------------- Encounter Discharge Information Details Patient Name: Date of Service: [name], [name] 03/04/2023 10:00 A M Medical Record Number: [id number] Patient Account Number: [id number] Date of Birth/Sex: Treating RN: August 02, 1951 (72 y.o. M) Primary Care Jaree Trinka: [name] Other Clinician: Referring Zarriah Starkel: Treating Nyella Eckels/Extender: Emilee Hero in Treatment: 4 Encounter Discharge Information Items Discharge Condition: Stable Ambulatory Status: Cane Discharge Destination:  Home Transportation: Private Auto Accompanied By: self Schedule Follow-up Appointment: Yes Clinical Summary of Care: Electronic Signature(s) Signed: 03/04/2023 12:27:48 PM By: [name] Entered By: [name] on 03/04/2023 12:26:58 -------------------------------------------------------------------------------- Vitals Details Patient Name: Date of Service: [name], [name] 03/04/2023 10:00 A M Medical Record Number: [id number] Patient Account Number: [id number] Date of Birth/Sex: Treating RN: April 12, 1951 (72 y.o. M) Primary Care Lang Zingg: [name] Other Clinician: Referring Jamari Diana: Treating Lonny Eisen/Extender: Emilee Hero in Treatment: 4 Vital Signs Time Taken: 10:00 Temperature (F): 97.9 Pulse (bpm): [address] ([id number]) [id number] Page 2 of 2 Respiratory Rate (breaths/min): 18 Blood Pressure (mmHg): 124/84 Capillary Blood Glucose (mg/dl): 366 Reference Range: 80 - 120 mg / dl Airway Pulse Oximetry (%): 99 Electronic Signature(s) Signed: 03/04/2023 12:27:48 PM By: [name] Entered By: [name] on 03/04/2023 12:20:19

## 2023-03-05 NOTE — Progress Notes (Signed)
ATTILIO, ZEITLER ([id number]) [id number] Page 1 of 2 Visit Report for 03/02/2023 Physician Orders Details Patient Name: Date of Service: [name], [name] 03/02/2023 1:30 PM Medical Record Number: [id number] Patient Account Number: [id number] Date of Birth/Sex: Treating RN: 07/11/1951 (72 y.o. M) Primary Care Provider: [name] Other Clinician: Referring Provider: Treating Provider/Extender: [name] in Treatment: 4 Verbal / Phone Orders: No Diagnosis Coding Wound Treatment Consults Ear, Nose Throat - I am referring my patient, Lawrence [name], to you for evaluation of His Ears for Tub Placement . Patient is currently in treatment with Hyperbaric Oxygen Therapy, with complaints of headaches and clogged ears. Electronic Signature(s) Signed: 03/02/2023 6:40:35 PM By: [name] Signed: 03/04/2023 9:47:58 AM By: [name] Entered By: [name] on 03/02/2023 16:30:49 Prescription 03/02/2023 -------------------------------------------------------------------------------- Royden Purl PA-C Patient Name: Provider: 1951-08-15 [id number] Date of Birth: NPI#: [id number] Sex: DEA #: [phone] Phone #: License #: UPN: Patient Address: [address] [facility] [place], [place] [address] [facility] [address] [place], [place] [address] [phone] Allergies fluvastatin; nitroglycerin; rosuvastatin; simvastatin; ezetimibe; Lipitor; lisinopril Provider's Orders Ear, Nose Throat - I am referring my patient, Lawrence [name], to you for evaluation of His Ears for Tub Placement . Patient is currently in treatment with Hyperbaric Oxygen Therapy, with complaints of headaches and clogged ears. Hand Signature: Date(s): Electronic Signature(s) Signed: 03/02/2023 6:40:35 PM By: [name] Signed: 03/04/2023 9:47:58 AM By:  [name] Entered By: [name] on 03/02/2023 16:30:50 -------------------------------------------------------------------------------- SuperBill Details Patient Name: Date of Service: [name], [name] 03/02/2023 Medical Record Number: [id number] Patient Account Number: [id number] Date of Birth/Sex: Treating RN: Aug 21, 1951 (72 y.o. 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 I25.10 Atherosclerotic heart disease of native coronary artery without angina pectoris I10 Essential (primary) hypertension I73.89 Other specified peripheral vascular diseases N18.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, stage 3a Facility Procedures : CPT4 Code: 41660630 Description: (Facility Use Only) HBOT full body chamber, , Modifier: Quantity: 4 Physician Procedures : CPT4: Description Modifier Code 16010 99183 Physician attendance and supervision of hyperbaric oxygen therapy, per sessionsupervision of hyperbaric Oxygen therapy, per session ICD-10 Diagnosis Description M86.371 Chronic multifocal osteomyelitis, right  ankle and foot 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

## 2023-03-05 NOTE — Progress Notes (Signed)
[name] ([id number]) [id number] Page 1 of 2 Visit Report for 02/26/2023 Arrival Information Details Patient Name: Date of Service: [name], [name] 02/26/2023 1:30 PM Medical Record Number: [id number] Patient Account Number: [id number] Date of Birth/Sex: Treating RN: Mar 29, 1951 (72 y.o. M) Primary Care Wendal Wilkie: [name] Other Clinician: Referring Kennidy Lamke: Treating Senta Kantor/Extender: [name] in Treatment: 3 Visit Information History Since Last Visit Added or deleted any medications: No Patient Arrived: [name] Any new allergies or adverse reactions: No Arrival Time: 16:30 Had a fall or experienced change in No Accompanied By: self activities of daily living that may affect Transfer Assistance: None risk of falls: Patient Requires Transmission-Based No Signs or symptoms of abuse/neglect since last visito No Precautions: Hospitalized since last visit: No Patient Has Alerts: Yes Implantable device outside of the clinic excluding No Patient Alerts: Patient on Blood Thinner cellular tissue based products placed in the center DIABETIC TYPE 1 since last visit: ABI L [place] TBI .68 10/07/22 Pain Present Now: No ABI R [place] 10/07/22 Electronic Signature(s) Signed: 03/04/2023 9:47:58 AM By: [name] Entered By: [name] on 02/26/2023 16:30:59 -------------------------------------------------------------------------------- Encounter Discharge Information Details Patient Name: Date of Service: [name], [name] 02/26/2023 1:30 PM Medical Record Number: [id number] Patient Account Number: [id number] Date of Birth/Sex: Treating RN: 09-12-1951 (72 y.o. M) Primary Care Candis Kabel: [name] Other Clinician: Referring Brayten Komar: Treating Channah Godeaux/Extender: [name] in Treatment: 3 Encounter Discharge Information Items Discharge Condition: Stable Ambulatory Status: Cane Discharge  Destination: Home Transportation: Private Auto Accompanied By: self Schedule Follow-up Appointment: No Clinical Summary of Care: Electronic Signature(s) Signed: 03/04/2023 9:47:58 AM By: [name] Entered By: [name] on 02/26/2023 16:36:07 -------------------------------------------------------------------------------- Vitals Details Patient Name: Date of Service: [name], [name] 02/26/2023 1:30 PM Medical Record Number: [id number] Patient Account Number: [id number] Date of Birth/Sex: Treating RN: 07-30-1951 (72 y.o. M) Primary Care Carisha Kantor: [name] Other Clinician: Referring Camber Ninh: Treating Jas Betten/Extender: [name] in Treatment: 3 Vital Signs Time Taken: 01:56 Temperature (F): 98.4 Pulse (bpm): [address] ([id number]) [id number] Page 2 of 2 Respiratory Rate (breaths/min): 18 Blood Pressure (mmHg): 122/78 Capillary Blood Glucose (mg/dl): 742 Reference Range: 80 - 120 mg / dl Airway Pulse Oximetry (%): 99 Electronic Signature(s) Signed: 03/04/2023 9:47:58 AM By: [name] Entered By: [name] on 02/26/2023 16:31:25

## 2023-03-05 NOTE — Progress Notes (Signed)
[name] ([id number]) [id number] Page 1 of 2 Visit Report for 02/27/2023 Arrival Information Details Patient Name: Date of Service: [name], [name] 02/27/2023 10:00 A M Medical Record Number: [id number] Patient Account Number: [id number] Date of Birth/Sex: Treating RN: May 31, 1951 (72 y.o. M) Primary Care Keithen Capo: [name] Other Clinician: Referring Samamtha Tiegs: Treating Jahaad Penado/Extender: [name] in Treatment: 4 Visit Information History Since Last Visit Added or deleted any medications: No Patient Arrived: [name] Any new allergies or adverse reactions: No Arrival Time: 10:38 Had a fall or experienced change in No Accompanied By: self activities of daily living that may affect Transfer Assistance: None risk of falls: Patient Requires Transmission-Based No Signs or symptoms of abuse/neglect since last visito No Precautions: Hospitalized since last visit: No Patient Has Alerts: Yes Implantable device outside of the clinic excluding No Patient Alerts: Patient on Blood Thinner cellular tissue based products placed in the center DIABETIC TYPE 1 since last visit: ABI L [place] TBI .68 10/07/22 Pain Present Now: No ABI R [place] 10/07/22 Electronic Signature(s) Signed: 03/04/2023 9:47:58 AM By: [name] Entered By: [name] on 02/27/2023 12:43:15 -------------------------------------------------------------------------------- Encounter Discharge Information Details Patient Name: Date of Service: [name], [name] 02/27/2023 10:00 A M Medical Record Number: [id number] Patient Account Number: [id number] Date of Birth/Sex: Treating RN: 1950/12/05 (72 y.o. M) Primary Care Genoa Freyre: [name] Other Clinician: Referring Emoni Whitworth: Treating Araminta Zorn/Extender: [name] in Treatment: 4 Encounter Discharge Information Items Discharge Condition: Stable Ambulatory Status: Cane Discharge  Destination: Home Transportation: Private Auto Accompanied By: wife Schedule Follow-up Appointment: Yes Clinical Summary of Care: Electronic Signature(s) Signed: 03/04/2023 9:47:58 AM By: [name] Entered By: [name] on 02/27/2023 12:44:44 -------------------------------------------------------------------------------- Vitals Details Patient Name: Date of Service: [name], [name] 02/27/2023 10:00 A M Medical Record Number: [id number] Patient Account Number: [id number] Date of Birth/Sex: Treating RN: [birth year] (72 y.o. M) Primary Care Ola Fawver: [name] Other Clinician: Referring Carmeron Heady: Treating Deondrea Aguado/Extender: [name] in Treatment: 4 Vital Signs Time Taken: 10:19 Temperature (F): 98.0 Pulse (bpm): [address] ([id number]) [id number] Page 2 of 2 Respiratory Rate (breaths/min): 18 Blood Pressure (mmHg): 124/80 Capillary Blood Glucose (mg/dl): 244 Reference Range: 80 - 120 mg / dl Airway Pulse Oximetry (%): 99 Electronic Signature(s) Signed: 03/04/2023 9:47:58 AM By: [name] Entered By: [name] on 02/27/2023 12:43:18

## 2023-03-05 NOTE — Progress Notes (Signed)
Lawrence [name] ([id number]) [id number] Page 1 of 1 Visit Report for 03/03/2023 HBO Safety Checklist Details Patient Name: Date of Service: [name], [name] 03/03/2023 1:30 PM Medical Record Number: [id number] Patient Account Number: [id number] Date of Birth/Sex: Treating RN: 1951-08-16 (72 y.o. M) Primary Care [place] Giammona: [name] Other Clinician: Referring Yoshie Kosel: Treating Chantrell Apsey/Extender: [name] in Treatment: 4 HBO Safety Checklist Items Safety Checklist Consent Form Signed Patient voided / foley secured and emptied When did you last eato 03/03/2023 Last dose of injectable or oral agent 03/03/2023 glucogan Ostomy pouch emptied and vented if applicable NA All implantable devices assessed, documented and approved NA Intravenous access site secured and place NA Valuables secured Linens and cotton and cotton/polyester blend (less than 51% polyester) Personal oil-based products / skin lotions / body lotions removed Wigs or hairpieces removed NA Smoking or tobacco materials removed NA Books / newspapers / magazines / loose paper removed NA Cologne, aftershave, perfume and deodorant removed Jewelry removed (may wrap wedding band) Make-up removed NA Hair care products removed Battery operated devices (external) removed NA Heating patches and chemical warmers removed NA Titanium eyewear removed NA Nail polish cured greater than 10 hours NA Casting material cured greater than 10 hours NA Hearing aids removed NA Loose dentures or partials removed NA Prosthetics have been removed NA Patient demonstrates correct use of air break device (if applicable) Patient concerns have been addressed Patient grounding bracelet on and cord attached to chamber Specifics for Inpatients (complete in addition to above) Medication sheet sent with patient Intravenous medications needed or due during therapy sent  with patient Drainage tubes (e.g. nasogastric tube or chest tube secured and vented) Endotracheal or Tracheotomy tube secured Cuff deflated of air and inflated with saline Airway suctioned Electronic Signature(s) Signed: 03/04/2023 9:47:58 AM By: [name] Entered By: [name] on 03/03/2023 14:26:18

## 2023-03-05 NOTE — Progress Notes (Signed)
Lawrence [name] ([id number]) [id number] Page 1 of 2 Visit Report for 02/24/2023 HBO Details Patient Name: Date of Service: Lawrence [name], Lawrence RLA ND W. 02/24/2023 1:30 PM Medical Record Number: [id number] Patient Account Number: [id number] Date of Birth/Sex: Treating RN: [birth year]Lawrence [name] (71 y.o. M) Primary Care Johnnye Sandford: [name] Other Clinician: Referring Wandy Bossler: Treating Karolynn Infantino/Extender: [name] in Treatment: 3 HBO Treatment Course Details Treatment Course Number: 1 Ordering Duana Benedict: [name] T Treatments Ordered: otal 40 HBO Treatment Start Date: 02/24/2023 HBO Indication: Other (specify in Notes) HBO Treatment Details Treatment Number: 1 Patient Type: Outpatient Chamber Type: Monoplace Chamber Serial #: [id number] Treatment Protocol: 2.0 ATA with 90 minutes oxygen, and no air breaks Treatment Details Compression Rate Down: 2.0 psi / minute De-Compression Rate Up: 1.0 psi / minute Air breaks and breathing Decompress Decompress Compress Tx Pressure Begins Reached periods Begins Ends (leave unused spaces blank) Chamber Pressure (ATA 1 2 ------2 1 ) Clock Time (24 hr) 1:45 1:58 - - - - - - 3:29 3:40 Treatment Length: 115 (minutes) Treatment Segments: 4 Vital Signs Capillary Blood Glucose Reference Range: 80 - 120 mg / dl HBO Diabetic Blood Glucose Intervention Range: <131 mg/dl or >782 mg/dl Type: Time Vitals Blood Respiratory Capillary Blood Glucose Pulse Action Pulse: Temperature: Taken: Pressure: Rate: Glucose (mg/dl): Meter #: Oximetry (%) Taken: Pre 01:45 140/80 62 18 97.6 153 1 99 none per protocol Post 03:42 126/80 18 98 144 1 98 none per protocol Treatment Response Treatment Toleration: Fair Treatment Completion Status: Treatment Completed without Adverse Event Treatment Notes Pt complained of persistent headaches. Shakeeta Godette notified/spoke with patient regarding headaches. Proceed with HBO per Dewayne Severe.  Once in chamber patient complained of pressure in ears and head. I stopped travel and notified Kea Callan. Ivey Nembhard completed an assessment. Per providers verbal orders. Increase travel at Lawrence slower rate decreased rate from 1.5 to 1.0. Patient completed the treatment well. Electronic Signature(s) Signed: 02/24/2023 4:59:06 PM By: [name] Signed: 03/04/2023 9:47:58 AM By: Francoise Ceo By: [name] on 02/24/2023 16:34:49 -------------------------------------------------------------------------------- HBO Safety Checklist Details Patient Name: Date of Service: Lawrence [name], Lawrence RLA ND W. 02/24/2023 1:30 PM Medical Record Number: [id number] Patient Account Number: [id number] Date of Birth/Sex: Treating RN: Lawrence [name], Lawrence [name] (72 y.o. M) Primary Care Kortez Murtagh: [name] Other Clinician: Referring Leibish Mcgregor: Treating Amira Podolak/Extender: [name] in Treatment: 3 Behring, [name] ([id number]) [id number] Page 2 of 2 HBO Safety Checklist Items Safety Checklist Consent Form Signed Patient voided / foley secured and emptied When did you last eato 02/24/2023 am Last dose of injectable or oral agent 02/24/23 glucagon Ostomy pouch emptied and vented if applicable NA All implantable devices assessed, documented and approved NA Intravenous access site secured and place NA Valuables secured Linens and cotton and cotton/polyester blend (less than 51% polyester) Personal oil-based products / skin lotions / body lotions removed Wigs or hairpieces removed NA Smoking or tobacco materials removed NA Books / newspapers / magazines / loose paper removed Cologne, aftershave, perfume and deodorant removed Jewelry removed (may wrap wedding band) Make-up removed Hair care products removed NA Battery operated devices (external) removed NA Heating patches and chemical warmers removed NA Titanium eyewear removed NA Nail polish cured greater than 10  hours NA Casting material cured greater than 10 hours NA Hearing aids removed NA Loose dentures or partials removed NA Prosthetics have been removed NA Patient demonstrates correct use of air break device (if applicable) spoke with Travone Georg pertaining to persistent Patient concerns  have been addressed headaches Patient grounding bracelet on and cord attached to chamber Specifics for Inpatients (complete in addition to above) Medication sheet sent with patient Intravenous medications needed or due during therapy sent with patient Drainage tubes (e.g. nasogastric tube or chest tube secured and vented) Endotracheal or Tracheotomy tube secured Cuff deflated of air and inflated with saline Airway suctioned Electronic Signature(s) Signed: 03/04/2023 9:47:58 AM By: [name] Entered By: [name] on 02/24/2023 16:25:52

## 2023-03-05 NOTE — Progress Notes (Signed)
CARLISLE, ENKE ([id number]) [id number] Page 1 of 2 Visit Report for 02/24/2023 Arrival Information Details Patient Name: Date of Service: [name], [name] 02/24/2023 1:30 PM Medical Record Number: [id number] Patient Account Number: [id number] Date of Birth/Sex: Treating RN: May 20, 1951 (72 y.o. M) Primary Care Charlton Boule: [name] Other Clinician: Referring Cregg Jutte: Treating Letisha Yera/Extender: [name] in Treatment: 3 Visit Information History Since Last Visit Added or deleted any medications: No Patient Arrived: [name] Any new allergies or adverse reactions: No Arrival Time: 14:48 Had a fall or experienced change in No Accompanied By: self activities of daily living that may affect Transfer Assistance: None risk of falls: Patient Identification Verified: Yes Signs or symptoms of abuse/neglect since last visito No Secondary Verification Process Completed: Yes Hospitalized since last visit: No Patient Requires Transmission-Based No Implantable device outside of the clinic excluding No Precautions: cellular tissue based products placed in the center Patient Has Alerts: Yes since last visit: Patient Alerts: Patient on Blood Thinner Has Dressing in Place as Prescribed: Yes DIABETIC TYPE 1 Pain Present Now: No ABI L [place] TBI .68 10/07/22 ABI R [place] 10/07/22 Electronic Signature(s) Signed: 03/04/2023 9:47:58 AM By: [name] Entered By: [name] on 02/24/2023 16:24:32 -------------------------------------------------------------------------------- Encounter Discharge Information Details Patient Name: Date of Service: [name], [name] 02/24/2023 1:30 PM Medical Record Number: [id number] Patient Account Number: [id number] Date of Birth/Sex: Treating RN: [birth year] (72 y.o. M) Primary Care Xochilth Standish: [name] Other Clinician: Referring Neomi Laidler: Treating Abdiel Blackerby/Extender: [name] in  Treatment: 3 Encounter Discharge Information Items Discharge Condition: Stable Ambulatory Status: Cane Discharge Destination: Home Transportation: Private Auto Accompanied By: self Schedule Follow-up Appointment: Yes Clinical Summary of Care: Electronic Signature(s) Signed: 03/04/2023 9:47:58 AM By: [name] Entered By: [name] on 02/24/2023 16:35:39 -------------------------------------------------------------------------------- Vitals Details Patient Name: Date of Service: [name], [name] 02/24/2023 1:30 PM Medical Record Number: [id number] Patient Account Number: [id number] Date of Birth/Sex: Treating RN: 1950-12-16 (72 y.o. M) Primary Care Jathen Sudano: [name] Other Clinician: Referring Tayleigh Wetherell: Treating Aivah Putman/Extender: [name] in Treatment: 3 Vital Signs Time Taken: 01:45 Temperature (F): 97.6 DOUG, BUCKLIN ([id number]) [id number] Page 2 of 2 Pulse (bpm): 62 Respiratory Rate (breaths/min): 18 Blood Pressure (mmHg): 140/80 Capillary Blood Glucose (mg/dl): 244 Reference Range: 80 - 120 mg / dl Airway Pulse Oximetry (%): 99 Electronic Signature(s) Signed: 03/04/2023 9:47:58 AM By: [name] Entered By: [name] on 02/24/2023 16:25:46

## 2023-03-05 NOTE — Progress Notes (Signed)
Lawrence [name] ([id number]) [id number] Page 1 of 4 Visit Report for 03/03/2023 Arrival Information Details Patient Name: Date of Service: [name], [name] 03/03/2023 1:30 PM Medical Record Number: [id number] Patient Account Number: [id number] Date of Birth/Sex: Treating RN: [birth year] (72 y.o. M) Primary Care Samier Jaco: [name] Other Clinician: Referring Dearion Huot: Treating Corinthian Kemler/Extender: [name] in Treatment: 4 Visit Information History Since Last Visit Added or deleted any medications: No Patient Arrived: Lawrence [name] Any new allergies or adverse reactions: No Arrival Time: 14:15 Had a fall or experienced change in No Accompanied By: self activities of daily living that may affect Transfer Assistance: None risk of falls: Patient Identification Verified: Yes Signs or symptoms of abuse/neglect since last visito No Secondary Verification Process Completed: Yes Hospitalized since last visit: No Patient Requires Transmission-Based Precautions: No Implantable device outside of the clinic excluding No Patient Has Alerts: Yes cellular tissue based products placed in the center since last visit: Pain Present Now: No Notes Pt came in today with complaints of headaches and blocked ears. Glucose level was obtained. Result 100, Per protocol gave patient 2 ensures, crackers and peanut butter. Guillermina Shaft notified. Glucose retaken 30 min after. Result 117. Patient prefers to r/s due to headaches. Lawrence [name] agreed Electronic Signature(s) Signed: 03/04/2023 9:47:58 AM By: [name] Entered By: [name] on 03/03/2023 14:40:09 -------------------------------------------------------------------------------- Clinic Level of Care Assessment Details Patient Name: Date of Service: [name], [name] 03/03/2023 1:30 PM Medical Record Number: [id number] Patient Account Number: [id number] Date of Birth/Sex: Treating RN: 05-28-1951 (72 y.o.  M) Primary Care Patrizia Paule: [name] Other Clinician: Referring Kalleigh Harbor: Treating Tynasia Mccaul/Extender: [name] in Treatment: 4 Clinic Level of Care Assessment Items TOOL 4 Quantity Score []  - 0 Use when only an EandM is performed on FOLLOW-UP visit ASSESSMENTS - Nursing Assessment / Reassessment []  - 0 Reassessment of Co-morbidities (includes updates in patient status) []  - 0 Reassessment of Adherence to Treatment Plan ASSESSMENTS - Wound and Skin A ssessment / Reassessment []  - 0 Simple Wound Assessment / Reassessment - one wound []  - 0 Complex Wound Assessment / Reassessment - multiple wounds []  - 0 Dermatologic / Skin Assessment (not related to wound area) ASSESSMENTS - Focused Assessment []  - 0 Circumferential Edema Measurements - multi extremities []  - 0 Nutritional Assessment / Counseling / Intervention []  - 0 Lower Extremity Assessment (monofilament, tuning fork, pulses) []  - 0 Peripheral Arterial Disease Assessment (using hand held doppler) ASSESSMENTS - Ostomy and/or Continence Assessment and Care []  - 0 Incontinence Assessment and Management []  - 0 Ostomy Care Assessment and Management (repouching, etc.) ARKEEM, HARTS ([id number]) [id number] Page 2 of 4 PROCESS - Coordination of Care []  - 0 Simple Patient / Family Education for ongoing care []  - 0 Complex (extensive) Patient / Family Education for ongoing care X- 1 10 Staff obtains Chiropractor, Records, T Results / Process Orders est []  - 0 Staff telephones HHA, Nursing Homes / Clarify orders / etc []  - 0 Routine Transfer to another Facility (non-emergent condition) []  - 0 Routine Hospital Admission (non-emergent condition) []  - 0 New Admissions / Manufacturing engineer / Ordering NPWT Apligraf, etc. , []  - 0 Emergency Hospital Admission (emergent condition) []  - 0 Simple Discharge Coordination []  - 0 Complex (extensive) Discharge  Coordination PROCESS - Special Needs []  - 0 Pediatric / Minor Patient Management []  - 0 Isolation Patient Management []  - 0 Hearing / Language / Visual special needs []  - 0  Assessment of Community assistance (transportation, D/C planning, etc.) []  - 0 Additional assistance / Altered mentation []  - 0 Support Surface(s) Assessment (bed, cushion, seat, etc.) INTERVENTIONS - Wound Cleansing / Measurement []  - 0 Simple Wound Cleansing - one wound []  - 0 Complex Wound Cleansing - multiple wounds []  - 0 Wound Imaging (photographs - any number of wounds) []  - 0 Wound Tracing (instead of photographs) []  - 0 Simple Wound Measurement - one wound []  - 0 Complex Wound Measurement - multiple wounds INTERVENTIONS - Wound Dressings []  - 0 Small Wound Dressing one or multiple wounds []  - 0 Medium Wound Dressing one or multiple wounds []  - 0 Large Wound Dressing one or multiple wounds []  - 0 Application of Medications - topical []  - 0 Application of Medications - injection INTERVENTIONS - Miscellaneous []  - 0 External ear exam []  - 0 Specimen Collection (cultures, biopsies, blood, body fluids, etc.) []  - 0 Specimen(s) / Culture(s) sent or taken to Lab for analysis []  - 0 Patient Transfer (multiple staff / Nurse, adult / Similar devices) []  - 0 Simple Staple / Suture removal (25 or less) []  - 0 Complex Staple / Suture removal (26 or more) []  - 0 Hypo / Hyperglycemic Management (close monitor of Blood Glucose) []  - 0 Ankle / Brachial Index (ABI) - do not check if billed separately X- 1 5 Vital Signs Has the patient been seen at the hospital within the last three years: Yes Total Score: 15 Level Of Care: New/Established - Level 1 Electronic Signature(s) Lawrence [name] ([id number]) [id number] Page 3 of 4 Signed: 03/04/2023 9:47:58 AM By: [name] Entered By: [name] on 03/04/2023  08:52:00 -------------------------------------------------------------------------------- Encounter Discharge Information Details Patient Name: Date of Service: [name], [name] 03/03/2023 1:30 PM Medical Record Number: [id number] Patient Account Number: [id number] Date of Birth/Sex: Treating RN: 1951-07-21 (72 y.o. M) Primary Care Marcey Persad: [name] Other Clinician: Referring Glennette Galster: Treating Anupama Piehl/Extender: [name] in Treatment: 4 Encounter Discharge Information Items Discharge Condition: Stable Ambulatory Status: Ambulatory Discharge Destination: Home Transportation: Private Auto Accompanied By: self Schedule Follow-up Appointment: Yes Clinical Summary of Care: Electronic Signature(s) Signed: 03/04/2023 9:47:58 AM By: [name] Entered By: [name] on 03/03/2023 14:41:01 -------------------------------------------------------------------------------- General Visit Notes Details Patient Name: Date of Service: [name], [name] 03/03/2023 1:30 PM Medical Record Number: [id number] Patient Account Number: [id number] Date of Birth/Sex: Treating RN: Sep 30, 1951 (72 y.o. M) Primary Care Lorie Melichar: [name] Other Clinician: Referring Ellaina Schuler: Treating Gabi Mcfate/Extender: [name] in Treatment: 4 Notes Pt came in today with complaints of headaches and blocked ears. Glucose level was obtained. Result 100, Per protocol gave patient 2 ensures, crackers and peanut butter. Zohaib Heeney notified. Glucose retaken 30 min after. Result 117. Patient prefers to r/s due to headaches. Kesha Hurrell agreed Electronic Signature(s) Signed: 03/04/2023 9:47:58 AM By: [name] Entered By: [name] on 03/03/2023 14:50:26 -------------------------------------------------------------------------------- Vitals Details Patient Name: Date of Service: [name], [name] 03/03/2023 1:30 PM Medical Record Number:  [id number] Patient Account Number: [id number] Date of Birth/Sex: Treating RN: 09-Nov-1950 (72 y.o. M) Primary Care Destiny Hagin: [name] Other Clinician: Referring Jovan Colligan: Treating Walsie Smeltz/Extender: [name] in Treatment: 4 Vital Signs Time Taken: 01:30 Temperature (F): 98.0 Pulse (bpm): 67 Respiratory Rate (breaths/min): 18 Blood Pressure (mmHg): 118/80 Capillary Blood Glucose (mg/dl): 595 Reference Range: 80 - 120 mg / dl Airway Pulse Oximetry (%): [address] ([id number])  [id number] Page 4 of 4 Electronic Signature(s) Signed: 03/04/2023 9:47:58 AM By: [name] Entered By: [name] on 03/03/2023 14:23:10

## 2023-03-05 NOTE — Progress Notes (Signed)
Lawrence [name] ([id number]) [id number] Page 1 of 2 Visit Report for 03/02/2023 HBO Details Patient Name: Date of Service: [name], [name] 03/02/2023 1:30 PM Medical Record Number: [id number] Patient Account Number: [id number] Date of Birth/Sex: Treating RN: September 26, 1951 (72 y.o. M) Primary Care Joss Friedel: [name] Other Clinician: Referring Yadira Hada: Treating Lyan Holck/Extender: [name] in Treatment: 4 HBO Treatment Course Details Treatment Course Number: 1 Ordering Cohen Doleman: [name] T Treatments Ordered: otal 40 HBO Treatment Start Date: 02/24/2023 HBO Indication: Other (specify in Notes) HBO Treatment Details Treatment Number: 4 Patient Type: Outpatient Chamber Type: Monoplace Chamber Serial #: [id number] Treatment Protocol: 2.0 ATA with 90 minutes oxygen, and no air breaks Treatment Details Compression Rate Down: 2.0 psi / minute De-Compression Rate Up: 1.0 psi / minute Air breaks and breathing Decompress Decompress Compress Tx Pressure Begins Reached periods Begins Ends (leave unused spaces blank) Chamber Pressure (ATA 1 2 ------2 1 ) Clock Time (24 hr) 1:48 1:57 - - - - - - 03:30 03:42 Treatment Length: 114 (minutes) Treatment Segments: 4 Vital Signs Capillary Blood Glucose Reference Range: 80 - 120 mg / dl HBO Diabetic Blood Glucose Intervention Range: <131 mg/dl or >086 mg/dl Type: Time Vitals Blood Respiratory Capillary Blood Glucose Pulse Action Pulse: Temperature: Taken: Pressure: Rate: Glucose (mg/dl): Meter #: Oximetry (%) Taken: Pre 01:40 134/80 80 18 98 174 99 none per protocol Post 03:42 120/80 74 18 98.1 143 1 99 none per protocol Treatment Response Treatment Toleration: Well Treatment Completion Status: Treatment Completed without Adverse Event Electronic Signature(s) Signed: 03/02/2023 6:40:35 PM By: [name] Signed: 03/04/2023 9:47:58 AM By: [name] Entered By: [name]  on 03/02/2023 16:36:41 -------------------------------------------------------------------------------- HBO Safety Checklist Details Patient Name: Date of Service: [name], [name] 03/02/2023 1:30 PM Medical Record Number: [id number] Patient Account Number: [id number] Date of Birth/Sex: Treating RN: 1951/09/08 (72 y.o. M) Primary Care Kylan Liberati: [name] Other Clinician: Referring Dawnetta Copenhaver: Treating Karel Mowers/Extender: [name] in Treatment: 4 HBO Safety Checklist Items Safety Checklist Consent Form Signed Lawrence [name], Lawrence [name] ([id number]) [id number] Page 2 of 2 Patient voided / foley secured and emptied When did you last eato 03/02/2023 Last dose of injectable or oral agent 03/02/2023 glu Ostomy pouch emptied and vented if applicable NA All implantable devices assessed, documented and approved NA Intravenous access site secured and place NA Valuables secured Linens and cotton and cotton/polyester blend (less than 51% polyester) Personal oil-based products / skin lotions / body lotions removed Wigs or hairpieces removed NA Smoking or tobacco materials removed NA Books / newspapers / magazines / loose paper removed Cologne, aftershave, perfume and deodorant removed Jewelry removed (may wrap wedding band) Make-up removed NA Hair care products removed Battery operated devices (external) removed NA Heating patches and chemical warmers removed NA Titanium eyewear removed NA Nail polish cured greater than 10 hours NA Casting material cured greater than 10 hours NA Hearing aids removed NA Loose dentures or partials removed NA Prosthetics have been removed NA Patient demonstrates correct use of air break device (if applicable) Patient concerns have been addressed Patient grounding bracelet on and cord attached to chamber Specifics for Inpatients (complete in addition to above) Medication sheet sent with  patient Intravenous medications needed or due during therapy sent with patient Drainage tubes (e.g. nasogastric tube or chest tube secured and vented) Endotracheal or Tracheotomy tube secured Cuff deflated of air and inflated with saline Airway suctioned Electronic Signature(s) Signed: 03/04/2023 9:47:58 AM  By: Francoise Ceo ByDemetria Pore on 03/02/2023 16:36:35

## 2023-03-05 NOTE — Progress Notes (Signed)
Lawrence [name] ([id number]) [id number] Page 1 of 2 Visit Report for 02/27/2023 HBO Details Patient Name: Date of Service: [name], [name] 02/27/2023 10:00 A M Medical Record Number: [id number] Patient Account Number: [id number] Date of Birth/Sex: Treating RN: 1951-10-04 (72 y.o. M) Primary Care Lindy Garczynski: [name] Other Clinician: Referring [name]: Treating [name]/Extender: [name] in Treatment: 4 HBO Treatment Course Details Treatment Course Number: 1 Ordering [name]: [name] T Treatments Ordered: otal 40 HBO Treatment Start Date: 02/24/2023 HBO Indication: Other (specify in Notes) HBO Treatment Details Treatment Number: 3 Patient Type: Outpatient Chamber Type: Monoplace Chamber Serial #: [id number] Treatment Protocol: 2.0 ATA with 90 minutes oxygen, and no air breaks Treatment Details Compression Rate Down: 2.0 psi / minute De-Compression Rate Up: 1.0 psi / minute Air breaks and breathing Decompress Decompress Compress Tx Pressure Begins Reached periods Begins Ends (leave unused spaces blank) Chamber Pressure (ATA 1 2 ------2 1 ) Clock Time (24 hr) 10:28 10:35 - - - - - - 12:05 12:19 Treatment Length: 111 (minutes) Treatment Segments: 4 Vital Signs Capillary Blood Glucose Reference Range: 80 - 120 mg / dl HBO Diabetic Blood Glucose Intervention Range: <131 mg/dl or >846 mg/dl Type: Time Vitals Blood Respiratory Capillary Blood Glucose Pulse Action Pulse: Temperature: Taken: Pressure: Rate: Glucose (mg/dl): Meter #: Oximetry (%) Taken: Pre 10:19 124/80 59 18 98 208 99 none per protocol Post 12:19 130/80 65 18 97.7 156 1 99 none per protocol Treatment Response Treatment Toleration: Well Treatment Completion Status: Treatment Completed without Adverse Event Electronic Signature(s) Signed: 02/27/2023 1:11:39 PM By: [name] Signed: 03/04/2023 9:47:58 AM By: [name] Previous Signature:  02/27/2023 12:43:01 PM Version By: [name] Entered By: [name] on 02/27/2023 12:44:13 -------------------------------------------------------------------------------- HBO Safety Checklist Details Patient Name: Date of Service: [name], [name] 02/27/2023 10:00 A M Medical Record Number: [id number] Patient Account Number: [id number] Date of Birth/Sex: Treating RN: May 22, 1951 (72 y.o. M) Primary Care Franchot Pollitt: [name] Other Clinician: Referring [name]: Treating [name]/Extender: [name] in Treatment: 4 HBO Safety Checklist Items Safety Checklist Consent Form Signed ASAHD, CAN ([id number]) [id number] Page 2 of 2 Consent Form Signed Patient voided / foley secured and emptied When did you last eato 02/27/23 am Last dose of injectable or oral agent 02/27/23 glycagon Ostomy pouch emptied and vented if applicable NA All implantable devices assessed, documented and approved NA Intravenous access site secured and place NA Valuables secured Linens and cotton and cotton/polyester blend (less than 51% polyester) Personal oil-based products / skin lotions / body lotions removed Wigs or hairpieces removed NA Smoking or tobacco materials removed NA Books / newspapers / magazines / loose paper removed Cologne, aftershave, perfume and deodorant removed Jewelry removed (may wrap wedding band) Make-up removed NA Hair care products removed NA Battery operated devices (external) removed NA Heating patches and chemical warmers removed NA Titanium eyewear removed NA Nail polish cured greater than 10 hours NA Casting material cured greater than 10 hours NA Hearing aids removed NA Loose dentures or partials removed NA Prosthetics have been removed NA Patient demonstrates correct use of air break device (if applicable) Patient concerns have been addressed Patient grounding bracelet on and cord attached  to chamber Specifics for Inpatients (complete in addition to above) Medication sheet sent with patient Intravenous medications needed or due during therapy sent with patient Drainage tubes (e.g. nasogastric tube or chest tube secured and vented) Endotracheal or Tracheotomy tube  secured Cuff deflated of air and inflated with saline Airway suctioned Electronic Signature(s) Signed: 03/04/2023 9:47:58 AM By: [name] Entered By: [name] on 02/27/2023 12:43:22

## 2023-03-06 ENCOUNTER — Encounter: Payer: Medicare Other | Admitting: Physician Assistant

## 2023-03-06 DIAGNOSIS — E10621 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: Secondary | ICD-10-CM | POA: Diagnosis not present

## 2023-03-06 LAB — GLUCOSE, CAPILLARY: Glucose-Capillary: 255 mg/dL — ABNORMAL HIGH (ref 70–99)

## 2023-03-06 NOTE — Progress Notes (Signed)
Lawrence [name] ([id number]) [id number] Page 1 of 2 Visit Report for 03/05/2023 Arrival Information Details Patient Name: Date of Service: [name], [name] 03/05/2023 1:30 PM Medical Record Number: [id number] Patient Account Number: [id number] Date of Birth/Sex: Treating RN: [birth year] (72 y.o. M) Primary Care Tarus Briski: [name] Other Clinician: Referring Brianna Bennett: Treating Reeta Kuk/Extender: [name] in Treatment: 4 Visit Information History Since Last Visit Added or deleted any medications: No Patient Arrived: Lawrence [name] Any new allergies or adverse reactions: No Arrival Time: 14:29 Had a fall or experienced change in No Accompanied By: self activities of daily living that may affect Transfer Assistance: None risk of falls: Patient Requires Transmission-Based Precautions: No Signs or symptoms of abuse/neglect since last visito No Patient Has Alerts: Yes Hospitalized since last visit: No Implantable device outside of the clinic excluding No cellular tissue based products placed in the center since last visit: Pain Present Now: No Electronic Signature(s) Signed: 03/05/2023 3:46:20 PM By: [name] Entered By: [name] on 03/05/2023 15:43:36 -------------------------------------------------------------------------------- Encounter Discharge Information Details Patient Name: Date of Service: [name], [name] 03/05/2023 1:30 PM Medical Record Number: [id number] Patient Account Number: [id number] Date of Birth/Sex: Treating RN: [birth year] (72 y.o. M) Primary Care Lawrence [name]: [name] Other Clinician: Referring Anira Senegal: Treating Kerolos Nehme/Extender: [name] in Treatment: 4 Encounter Discharge Information Items Discharge Condition: Stable Ambulatory Status: Cane Discharge Destination: Home Transportation: Private Auto Accompanied By: self Schedule Follow-up Appointment:  Yes Clinical Summary of Care: Electronic Signature(s) Signed: 03/05/2023 3:46:20 PM By: [name] Entered By: [name] on 03/05/2023 15:45:35 -------------------------------------------------------------------------------- Vitals Details Patient Name: Date of Service: [name], [name] 03/05/2023 1:30 PM Medical Record Number: [id number] Patient Account Number: [id number] Date of Birth/Sex: Treating RN: [birth year] (72 y.o. M) Primary Care Lawrence [name]: [name] Other Clinician: Referring Wauneta Silveria: Treating Klinton Candelas/Extender: [name] in Treatment: 4 Vital Signs Time Taken: 01:35 Temperature (F): 98.0 Pulse (bpm): [address] ([id number]) [id number] Page 2 of 2 Respiratory Rate (breaths/min): 18 Blood Pressure (mmHg): 128/80 Capillary Blood Glucose (mg/dl): 244 Reference Range: 80 - 120 mg / dl Airway Pulse Oximetry (%): 99 Electronic Signature(s) Signed: 03/05/2023 3:46:20 PM By: [name] Entered By: [name] on 03/05/2023 15:43:39

## 2023-03-06 NOTE — Progress Notes (Signed)
Lawrence [name] ([id number]) [id number] Page 1 of 2 Visit Report for 03/05/2023 HBO Details Patient Name: Date of Service: [name], [name] 03/05/2023 1:30 PM Medical Record Number: [id number] Patient Account Number: [id number] Date of Birth/Sex: Treating RN: 01/01/1951 (72 y.o. M) Primary Care Annisten Manchester: [name] Other Clinician: Referring Laniya Friedl: Treating Bobbye Reinitz/Extender: [name] in Treatment: 4 HBO Treatment Course Details Treatment Course Number: 1 Ordering Burnett Lieber: [name] T Treatments Ordered: otal 40 HBO Treatment Start Date: 02/24/2023 HBO Indication: Other (specify in Notes) HBO Treatment Details Treatment Number: 6 Patient Type: Outpatient Chamber Type: Monoplace Chamber Serial #: [id number] Treatment Protocol: 2.0 ATA with 90 minutes oxygen, and no air breaks Treatment Details Compression Rate Down: 2.0 psi / minute De-Compression Rate Up: 1.5 psi / minute Air breaks and breathing Decompress Decompress Compress Tx Pressure Begins Reached periods Begins Ends (leave unused spaces blank) Chamber Pressure (ATA 1 2 ------2 1 ) Clock Time (24 hr) 1:36 1:52 - - - - - - 3:21 3:32 Treatment Length: 116 (minutes) Treatment Segments: 4 Vital Signs Capillary Blood Glucose Reference Range: 80 - 120 mg / dl HBO Diabetic Blood Glucose Intervention Range: <131 mg/dl or >295 mg/dl Type: Time Vitals Blood Respiratory Capillary Blood Glucose Pulse Action Pulse: Temperature: Taken: Pressure: Rate: Glucose (mg/dl): Meter #: Oximetry (%) Taken: Pre 01:35 128/80 63 18 98 230 99 none per protocol Post 03:32 120/68 63 18 97.6 153 1 98 none per protocol Treatment Response Treatment Toleration: Well Treatment Completion Status: Treatment Completed without Adverse Event Electronic Signature(s) Signed: 03/05/2023 3:46:20 PM By: [name] Signed: 03/05/2023 4:17:31 PM By: [name] Entered By: [name] on  03/05/2023 15:45:08 -------------------------------------------------------------------------------- HBO Safety Checklist Details Patient Name: Date of Service: [name], [name] 03/05/2023 1:30 PM Medical Record Number: [id number] Patient Account Number: [id number] Date of Birth/Sex: Treating RN: 01/18/1951 (72 y.o. M) Primary Care Madilyn Cephas: [name] Other Clinician: Referring Wesly Whisenant: Treating Tenisha Fleece/Extender: [name] in Treatment: 4 HBO Safety Checklist Items Safety Checklist Consent Form Signed KATHARINE, YANNUZZI ([id number]) [id number] Page 2 of 2 Patient voided / foley secured and emptied When did you last eato 03/05/2023 am Last dose of injectable or oral agent 03/05/23 glucogan Ostomy pouch emptied and vented if applicable NA All implantable devices assessed, documented and approved NA Intravenous access site secured and place NA Valuables secured Linens and cotton and cotton/polyester blend (less than 51% polyester) Personal oil-based products / skin lotions / body lotions removed Wigs or hairpieces removed NA Smoking or tobacco materials removed NA Books / newspapers / magazines / loose paper removed NA Cologne, aftershave, perfume and deodorant removed Jewelry removed (may wrap wedding band) Make-up removed NA Hair care products removed NA Battery operated devices (external) removed NA Heating patches and chemical warmers removed NA Titanium eyewear removed NA Nail polish cured greater than 10 hours NA Casting material cured greater than 10 hours NA Hearing aids removed NA Loose dentures or partials removed NA Prosthetics have been removed NA Patient demonstrates correct use of air break device (if applicable) Patient concerns have been addressed Patient grounding bracelet on and cord attached to chamber Specifics for Inpatients (complete in addition to above) Medication sheet sent with  patient Intravenous medications needed or due during therapy sent with patient Drainage tubes (e.g. nasogastric tube or chest tube secured and vented) Endotracheal or Tracheotomy tube secured Cuff deflated of air and inflated with saline Airway suctioned Electronic Signature(s) Signed:  03/05/2023 3:46:20 PM By: [name] Entered By: [name] on 03/05/2023 15:43:43

## 2023-03-07 NOTE — Progress Notes (Signed)
Lawrence [name] ([id number]) [id number] Page 1 of 9 Visit Report for 03/06/2023 Arrival Information Details Patient Name: Date of Service: [name], [name] 03/06/2023 10:45 A M Medical Record Number: [id number] Patient Account Number: [id number] Date of Birth/Sex: Treating RN: 1951/09/03 (72 y.o. [name]) [name] Primary Care Lawana Hartzell: [name] Other Clinician: Referring Yordan Martindale: Treating Yuliana Vandrunen/Extender: [name] in Treatment: 5 Visit Information History Since Last Visit Added or deleted any medications: No Patient Arrived: Ambulatory Any new allergies or adverse reactions: No Arrival Time: 10:42 Had a fall or experienced change in No Accompanied By: wife activities of daily living that may affect Transfer Assistance: None risk of falls: Patient Identification Verified: Yes Signs or symptoms of abuse/neglect since last visito No Secondary Verification Process Completed: Yes Hospitalized since last visit: No Patient Requires Transmission-Based Precautions: No Implantable device outside of the clinic excluding No Patient Has Alerts: Yes cellular tissue based products placed in the center since last visit: Has Dressing in Place as Prescribed: Yes Pain Present Now: No Electronic Signature(s) Signed: 03/06/2023 1:43:20 PM By: [name] Entered By: [name] on 03/06/2023 11:07:52 -------------------------------------------------------------------------------- Clinic Level of Care Assessment Details Patient Name: Date of Service: [name], [name] 03/06/2023 10:45 A M Medical Record Number: [id number] Patient Account Number: [id number] Date of Birth/Sex: Treating RN: 1951-10-19 (72 y.o. [name]) [name] Primary Care Isaias Dowson: [name] Other Clinician: Referring Rien Marland: Treating Haillie Radu/Extender: [name] in Treatment: 5 Clinic Level of Care Assessment Items TOOL 1 Quantity  Score []  - 0 Use when EandM and Procedure is performed on INITIAL visit ASSESSMENTS - Nursing Assessment / Reassessment []  - 0 General Physical Exam (combine w/ comprehensive assessment (listed just below) when performed on new pt. evals) []  - 0 Comprehensive Assessment (HX, ROS, Risk Assessments, Wounds Hx, etc.) ASSESSMENTS - Wound and Skin Assessment / Reassessment []  - 0 Dermatologic / Skin Assessment (not related to wound area) ASSESSMENTS - Ostomy and/or Continence Assessment and Care []  - 0 Incontinence Assessment and Management []  - 0 Ostomy Care Assessment and Management (repouching, etc.) PROCESS - Coordination of Care []  - 0 Simple Patient / Family Education for ongoing care []  - 0 Complex (extensive) Patient / Family Education for ongoing care []  - 0 Staff obtains Chiropractor, Records, T Results / Process Orders est []  - 0 Staff telephones HHA, Nursing Homes / Clarify orders / etc []  - 0 Routine Transfer to another Facility (non-emergent condition) []  - 0 Routine Hospital Admission (non-emergent condition) []  - 0 New Admissions / [employer] / Ordering NPWT Apligraf, etc. , Lawrence [name] ([id number]) [id number] Page 2 of 9 []  - 0 Emergency Hospital Admission (emergent condition) PROCESS - Special Needs []  - 0 Pediatric / Minor Patient Management []  - 0 Isolation Patient Management []  - 0 Hearing / Language / Visual special needs []  - 0 Assessment of Community assistance (transportation, D/C planning, etc.) []  - 0 Additional assistance / Altered mentation []  - 0 Support Surface(s) Assessment (bed, cushion, seat, etc.) INTERVENTIONS - Miscellaneous []  - 0 External ear exam []  - 0 Patient Transfer (multiple staff / Nurse, adult / Similar devices) []  - 0 Simple Staple / Suture removal (25 or less) []  - 0 Complex Staple / Suture removal (26 or more) []  - 0 Hypo/Hyperglycemic Management (do not check if billed  separately) []  - 0 Ankle / Brachial Index (ABI) - do not check if billed separately Has the patient been seen  at the hospital within the last three years: Yes Total Score: 0 Level Of Care: ____ Electronic Signature(s) Signed: 03/06/2023 11:36:48 AM By: [name] RN Entered By: [name] on 03/06/2023 11:02:14 -------------------------------------------------------------------------------- Encounter Discharge Information Details Patient Name: Date of Service: [name], [name] 03/06/2023 10:45 A M Medical Record Number: [id number] Patient Account Number: [id number] Date of Birth/Sex: Treating RN: 09/20/1951 (72 y.o. [name] Primary Care Tayen Narang: [name] Other Clinician: Referring Ahliya Glatt: Treating Briahna Pescador/Extender: [name] in Treatment: 5 Encounter Discharge Information Items Post Procedure Vitals Discharge Condition: Stable Temperature (F): 97.5 Ambulatory Status: Ambulatory Pulse (bpm): 61 Discharge Destination: Home Respiratory Rate (breaths/min): 18 Transportation: Private Auto Blood Pressure (mmHg): 157/77 Accompanied By: wife Schedule Follow-up Appointment: Yes Clinical Summary of Care: Electronic Signature(s) Signed: 03/06/2023 11:36:48 AM By: [name] RN Entered By: [name] on 03/06/2023 11:03:55 -------------------------------------------------------------------------------- Lower Extremity Assessment Details Patient Name: Date of Service: [name], [name] 03/06/2023 10:45 A M Medical Record Number: [id number] Patient Account Number: [id number] Date of Birth/Sex: Treating RN: 1950-12-07 (72 y.o. [name] Primary Care Leonidas Boateng: [name] Other Clinician: Referring Edee Nifong: Treating Debbra Digiulio/Extender: [name] in Treatment: 5 Vascular Assessment Pulses: Dorsalis RISHAD, BOULDEN ([id number]) [Left:Yes] [Right:[id number] Page 3 of  9 Yes] Electronic Signature(s) Signed: 03/06/2023 11:36:48 AM By: [name] RN Entered By: [name] on 03/06/2023 10:52:44 -------------------------------------------------------------------------------- Multi Wound Chart Details Patient Name: Date of Service: [name], [name] 03/06/2023 10:45 A M Medical Record Number: [id number] Patient Account Number: [id number] Date of Birth/Sex: Treating RN: 11/10/1950 (72 y.o. [name]) [name] Primary Care Marguis Mathieson: [name] Other Clinician: Referring Valmai Vandenberghe: Treating Earlena Werst/Extender: [name] in Treatment: 5 Vital Signs Height(in): Pulse(bpm): 61 Weight(lbs): Blood Pressure(mmHg): 157/77 Body Mass Index(BMI): Temperature(F): 97.5 Respiratory Rate(breaths/min): 18 [1:Photos:] Right T Second oe Right T Third oe Left T Third oe Wound Location: Gradually Appeared Gradually Appeared Gradually Appeared Wounding Event: Diabetic Wound/Ulcer of the Lower Diabetic Wound/Ulcer of the Lower Diabetic Wound/Ulcer of the Lower Primary Etiology: Extremity Extremity Extremity Glaucoma, Coronary Artery Disease, Glaucoma, Coronary Artery Disease, Glaucoma, Coronary Artery Disease, Comorbid History: Hypertension, Myocardial Infarction, Hypertension, Myocardial Infarction, Hypertension, Myocardial Infarction, Type II Diabetes, End Stage Renal Type II Diabetes, End Stage Renal Type II Diabetes, End Stage Renal Disease Disease Disease 11/03/2022 11/03/2022 12/05/2022 Date Acquired: 5 5 5  Weeks of Treatment: Open Open Open Wound Status: No No No Wound Recurrence: Yes Yes Yes Pending A mputation on Presentation: 1x0.6x0.3 0.7x0.8x0.1 1.2x2x0.2 Measurements L x W x D (cm) 0.471 0.44 1.885 A (cm) : rea 0.141 0.044 0.377 Volume (cm) : 40.00% 6.60% 0.00% % Reduction in A rea: 40.30% 6.40% 0.00% % Reduction in Volume: Grade 3 Grade 3 Grade 3 Classification: Medium Medium Medium Exudate A  mount: Serosanguineous Serosanguineous Serosanguineous Exudate Type: red, brown red, brown red, brown Exudate Color: No No Yes Foul Odor A Cleansing: fter N/A N/A No Odor A nticipated Due to Product Use: Distinct, outline attached Distinct, outline attached Distinct, outline attached Wound Margin: Small (1-33%) None Present (0%) None Present (0%) Granulation A mount: Pink N/A N/A Granulation Quality: Large (67-100%) Large (67-100%) Large (67-100%) Necrotic A mount: Bone: Yes Fascia: No Fascia: No Exposed Structures: Fat Layer (Subcutaneous Tissue): No Fat Layer (Subcutaneous Tissue): No Fat Layer (Subcutaneous Tissue): No Tendon: No Tendon: No Muscle: No Muscle: No Joint: No Joint: No Bone: No Bone: No None None None Epithelialization:  Treatment Notes JAP, HAASCH ([id number]) [id number] Page 4 of 9 Electronic Signature(s) Signed: 03/06/2023 11:36:48 AM By: [name] RN Entered By: [name] on 03/06/2023 10:52:48 -------------------------------------------------------------------------------- Multi-Disciplinary Care Plan Details Patient Name: Date of Service: [name], [name] 03/06/2023 10:45 A M Medical Record Number: [id number] Patient Account Number: [id number] Date of Birth/Sex: Treating RN: [birth year] (72 y.o. [name]) [name] Primary Care Nashiya Disbrow: [name] Other Clinician: Referring Vega Withrow: Treating Makailey Hodgkin/Extender: [name] in Treatment: 5 Active Inactive Wound/Skin Impairment Nursing Diagnoses: Knowledge deficit related to ulceration/compromised skin integrity Goals: Patient/caregiver will verbalize understanding of skin care regimen Date Initiated: 01/30/2023 Target Resolution Date: 04/01/2023 Goal Status: Active Ulcer/skin breakdown will have a volume reduction of 30% by week 4 Date Initiated: 01/30/2023 Date Inactivated: 03/06/2023 Target Resolution Date: 03/02/2023 Goal  Status: Unmet Unmet Reason: comorbidities Ulcer/skin breakdown will have a volume reduction of 50% by week 8 Date Initiated: 01/30/2023 Target Resolution Date: 04/01/2023 Goal Status: Active Ulcer/skin breakdown will have a volume reduction of 80% by week 12 Date Initiated: 01/30/2023 Target Resolution Date: 05/02/2023 Goal Status: Active Ulcer/skin breakdown will heal within 14 weeks Date Initiated: 01/30/2023 Target Resolution Date: 06/01/2023 Goal Status: Active Interventions: Assess patient/caregiver ability to obtain necessary supplies Assess patient/caregiver ability to perform ulcer/skin care regimen upon admission and as needed Assess ulceration(s) every visit Notes: Electronic Signature(s) Signed: 03/06/2023 11:36:48 AM By: [name] RN Entered By: [name] on 03/06/2023 10:53:18 -------------------------------------------------------------------------------- Pain Assessment Details Patient Name: Date of Service: [name] MS, [name] 03/06/2023 10:45 A M Medical Record Number: [id number] Patient Account Number: [id number] Date of Birth/Sex: Treating RN: 1950/12/30 (72 y.o. [name] Primary Care Sundra Haddix: [name] Other Clinician: Referring Deleah Tison: Treating Cordelia Bessinger/Extender: [name] in Treatment: 5 Active Problems Location of Pain Severity and Description of Pain Patient Has Paino No Site Locations Gray ([id number]) [phone].pdf Page 5 of 9 Pain Management and Medication Current Pain Management: Electronic Signature(s) Signed: 03/06/2023 11:36:48 AM By: [name] RN Entered By: [name] on 03/06/2023 10:47:10 -------------------------------------------------------------------------------- Patient/Caregiver Education Details Patient Name: Date of Service: [name] MS, [name] 5/3/2024andnbsp10:45 A M Medical Record Number: [id number] Patient Account Number: [id number] Date of  Birth/Gender: Treating RN: Dec 04, 1950 (72 y.o. [name]) [name] Primary Care Physician: [name] Other Clinician: Referring Physician: Treating Physician/Extender: [name] in Treatment: 5 Education Assessment Education Provided To: Patient Education Topics Provided Wound/Skin Impairment: Handouts: Caring for Your Ulcer Methods: Explain/Verbal Responses: State content correctly Electronic Signature(s) Signed: 03/06/2023 11:36:48 AM By: [name] RN Entered By: [name] on 03/06/2023 10:53:36 -------------------------------------------------------------------------------- Wound Assessment Details Patient Name: Date of Service: [name], [name] 03/06/2023 10:45 A M Medical Record Number: [id number] Patient Account Number: [id number] Date of Birth/Sex: Treating RN: [birth year] (72 y.o. [name] Primary Care Exie Chrismer: [name] Other Clinician: Referring Elijha Dedman: Treating Stanton Kissoon/Extender: [name] in Treatment: 5 Wound Status Wound Number: 1 Primary Diabetic Wound/Ulcer of the Lower Extremity Etiology: Wound Location: Right T Second oe Wound Open Wounding Event: Gradually Appeared Status: Date Acquired: 11/03/2022 Comorbid Glaucoma, Coronary Artery Disease, Hypertension, Myocardial Weeks Of Treatment: 5 TAHMID, DILONE ([id number]) [id number] Page 6 of 9 Weeks Of Treatment: 5 History: Infarction, Type II Diabetes, End Stage Renal Disease Clustered Wound: No Pending Amputation On Presentation Photos Wound Measurements Length: (cm) 1 Width: (cm) 0.6 Depth: (cm) 0.3 Area: (cm) 0.471 Volume: (cm)  0.141 % Reduction in Area: 40% % Reduction in Volume: 40.3% Epithelialization: None Tunneling: No Undermining: No Wound Description Classification: Grade 3 Wound Margin: Distinct, outline attached Exudate Amount: Medium Exudate Type: Serosanguineous Exudate Color:  red, brown Foul Odor After Cleansing: No Slough/Fibrino Yes Wound Bed Granulation Amount: Small (1-33%) Exposed Structure Granulation Quality: Pink Fat Layer (Subcutaneous Tissue) Exposed: No Necrotic Amount: Large (67-100%) Bone Exposed: Yes Necrotic Quality: Adherent Slough Treatment Notes Wound #1 (Toe Second) Wound Laterality: Right Cleanser Soap and Water Discharge Instruction: Gently cleanse wound with antibacterial soap, rinse and pat dry prior to dressing wounds Peri-Wound Care Topical Primary Dressing Silvercel Small 2x2 (in/in) Discharge Instruction: Apply Silvercel Small 2x2 (in/in) as instructed Secondary Dressing Coverlet Latex-Free Fabric Adhesive Dressings Discharge Instruction: 1.5 x 2 Secured With Compression Wrap Compression Stockings Add-Ons Electronic Signature(s) Signed: 03/06/2023 11:36:48 AM By: [name] RN Entered By: [name] on 03/06/2023 10:51:58 Wound Assessment Details -------------------------------------------------------------------------------- Lawrence [name] ([id number]) [id number] Page 7 of 9 Patient Name: Date of Service: [name], [name] 03/06/2023 10:45 A M Medical Record Number: [id number] Patient Account Number: [id number] Date of Birth/Sex: Treating RN: [birth year] (72 y.o. [name]) [name] Primary Care Rubbie Goostree: [name] Other Clinician: Referring Corinda Ammon: Treating Graycen Degan/Extender: [name] in Treatment: 5 Wound Status Wound Number: 2 Primary Diabetic Wound/Ulcer of the Lower Extremity Etiology: Wound Location: Right T Third oe Wound Open Wounding Event: Gradually Appeared Status: Date Acquired: 11/03/2022 Comorbid Glaucoma, Coronary Artery Disease, Hypertension, Myocardial Weeks Of Treatment: 5 History: Infarction, Type II Diabetes, End Stage Renal Disease Clustered Wound: No Pending Amputation On Presentation Photos Wound Measurements Length: (cm)  0.7 % Reduction in Area: 6.6% Width: (cm) 0.8 % Reduction in Volume: 6.4% Depth: (cm) 0.1 Epithelialization: None Area: (cm) 0.44 Tunneling: No Volume: (cm) 0.044 Undermining: No Wound Description Classification: Grade 3 Foul Odor After Cleansing: No Wound Margin: Distinct, outline attached Slough/Fibrino Yes Exudate Amount: Medium Exudate Type: Serosanguineous Exudate Color: red, brown Wound Bed Granulation Amount: None Present (0%) Exposed Structure Necrotic Amount: Large (67-100%) Fascia Exposed: No Necrotic Quality: Adherent Slough Fat Layer (Subcutaneous Tissue) Exposed: No Tendon Exposed: No Muscle Exposed: No Joint Exposed: No Bone Exposed: No Treatment Notes Wound #2 (Toe Third) Wound Laterality: Right Cleanser Soap and Water Discharge Instruction: Gently cleanse wound with antibacterial soap, rinse and pat dry prior to dressing wounds Peri-Wound Care Topical Primary Dressing Silvercel Small 2x2 (in/in) Discharge Instruction: Apply Silvercel Small 2x2 (in/in) as instructed Secondary Dressing Coverlet Latex-Free Fabric Adhesive Dressings Discharge Instruction: 1.5 x 2 Secured With Lawrence [name] ([id number]) [id number] Page 8 of 9 Compression Wrap Compression Stockings Add-Ons Electronic Signature(s) Signed: 03/06/2023 11:36:48 AM By: [name] RN Entered By: [name] on 03/06/2023 10:52:17 -------------------------------------------------------------------------------- Wound Assessment Details Patient Name: Date of Service: [name], [name] 03/06/2023 10:45 A M Medical Record Number: [id number] Patient Account Number: [id number] Date of Birth/Sex: Treating RN: [birth year] (72 y.o. [name]) [name] Primary Care Kashmere Staffa: [name] Other Clinician: Referring Gentri Guardado: Treating Natsuko Kelsay/Extender: [name] in Treatment: 5 Wound Status Wound Number: 4 Primary Diabetic Wound/Ulcer of the  Lower Extremity Etiology: Wound Location: Left T Third oe Wound Open Wounding Event: Gradually Appeared Status: Date Acquired: 12/05/2022 Comorbid Glaucoma, Coronary Artery Disease, Hypertension, Myocardial Weeks Of Treatment: 5 History: Infarction, Type II Diabetes, End Stage Renal Disease Clustered Wound: No Pending Amputation On Presentation Photos Wound Measurements Length: (cm) 1.2 Width: (cm) 2 Depth: (cm) 0.2 Area: (cm)  1.885 Volume: (cm) 0.377 % Reduction in Area: 0% % Reduction in Volume: 0% Epithelialization: None Tunneling: No Undermining: No Wound Description Classification: Grade 3 Wound Margin: Distinct, outline attached Exudate Amount: Medium Exudate Type: Serosanguineous Exudate Color: red, brown Foul Odor After Cleansing: Yes Due to Product Use: No Slough/Fibrino Yes Wound Bed Granulation Amount: None Present (0%) Exposed Structure Necrotic Amount: Large (67-100%) Fascia Exposed: No Necrotic Quality: Adherent Slough Fat Layer (Subcutaneous Tissue) Exposed: No Tendon Exposed: No Muscle Exposed: No Joint Exposed: No Bone Exposed: No Treatment Notes Wound #4 (Toe Third) Wound Laterality: Left Cleanser Soap and [address] HAKI, PASSLEY ([id number]) [id number] Page 9 of 9 Discharge Instruction: Gently cleanse wound with antibacterial soap, rinse and pat dry prior to dressing wounds Peri-Wound Care Topical Primary Dressing Silvercel Small 2x2 (in/in) Discharge Instruction: Apply Silvercel Small 2x2 (in/in) as instructed Secondary Dressing Coverlet Latex-Free Fabric Adhesive Dressings Discharge Instruction: 1.5 x 2 Secured With Compression Wrap Compression Stockings Add-Ons Electronic Signature(s) Signed: 03/06/2023 11:36:48 AM By: [name] RN Entered By: [name] on 03/06/2023 10:52:35 -------------------------------------------------------------------------------- Vitals Details Patient Name: Date of  Service: [name], [name] 03/06/2023 10:45 A M Medical Record Number: [id number] Patient Account Number: [id number] Date of Birth/Sex: Treating RN: [birth year] (72 y.o. [name] Primary Care Elna Radovich: [name] Other Clinician: Referring Sierra Bissonette: Treating Alyscia Carmon/Extender: [name] in Treatment: 5 Vital Signs Time Taken: 10:46 Temperature (F): 97.5 Pulse (bpm): 61 Respiratory Rate (breaths/min): 18 Blood Pressure (mmHg): 157/77 Reference Range: 80 - 120 mg / dl Electronic Signature(s) Signed: 03/06/2023 11:36:48 AM By: [name] RN Entered By: [name] on 03/06/2023 10:47:03

## 2023-03-07 NOTE — Progress Notes (Signed)
[name] ([id number]) [id number] Page 1 of 2 Visit Report for 03/06/2023 Arrival Information Details Patient Name: Date of Service: [name], [name] 03/06/2023 11:30 A M Medical Record Number: [id number] Patient Account Number: [id number] Date of Birth/Sex: Treating RN: Jun 10, 1951 (72 y.o. M) Primary Care Merin Borjon: [name] Other Clinician: Referring Takhia Spoon: Treating Jayceon Troy/Extender: [name] in Treatment: 5 Visit Information History Since Last Visit Added or deleted any medications: No Patient Arrived: [name] Any new allergies or adverse reactions: No Arrival Time: 11:25 Had a fall or experienced change in No Accompanied By: self activities of daily living that may affect Transfer Assistance: None risk of falls: Patient Requires Transmission-Based Precautions: No Signs or symptoms of abuse/neglect since last visito No Patient Has Alerts: Yes Hospitalized since last visit: No Implantable device outside of the clinic excluding No cellular tissue based products placed in the center since last visit: Pain Present Now: No Electronic Signature(s) Signed: 03/06/2023 1:43:20 PM By: [name] Entered By: [name] on 03/06/2023 13:15:02 -------------------------------------------------------------------------------- Encounter Discharge Information Details Patient Name: Date of Service: [name], [name] 03/06/2023 11:30 A M Medical Record Number: [id number] Patient Account Number: [id number] Date of Birth/Sex: Treating RN: 26-May-1951 (72 y.o. M) Primary Care Felicity Penix: [name] Other Clinician: [name] Referring Shannette Tabares: Treating Arvil Utz/Extender: [name] in Treatment: 5 Encounter Discharge Information Items Discharge Condition: Stable Ambulatory Status: Cane Discharge Destination: Home Transportation: Private Auto Accompanied By: self Schedule Follow-up  Appointment: Yes Clinical Summary of Care: Electronic Signature(s) Signed: 03/06/2023 1:43:20 PM By: [name] Entered By: [name] on 03/06/2023 13:42:55 -------------------------------------------------------------------------------- Vitals Details Patient Name: Date of Service: [name], [name] 03/06/2023 11:30 A M Medical Record Number: [id number] Patient Account Number: [id number] Date of Birth/Sex: Treating RN: 07/18/1951 (72 y.o. M) Primary Care Dawnya Grams: [name] Other Clinician: Referring Tashyra Adduci: Treating Holley Wirt/Extender: [name] in Treatment: 5 Vital Signs Time Taken: 11:26 Temperature (F): 97.8 Pulse (bpm): [address] ([id number]) [phone].pdf Page 2 of 2 Respiratory Rate (breaths/min): 18 Blood Pressure (mmHg): 153/77 Capillary Blood Glucose (mg/dl): 433 Reference Range: 80 - 120 mg / dl Airway Pulse Oximetry (%): 99 Electronic Signature(s) Signed: 03/06/2023 1:43:20 PM By: [name] Entered By: [name] on 03/06/2023 13:15:05

## 2023-03-09 ENCOUNTER — Encounter: Payer: Medicare Other | Admitting: Physician Assistant

## 2023-03-09 LAB — GLUCOSE, CAPILLARY: Glucose-Capillary: 240 mg/dL — ABNORMAL HIGH (ref 70–99)

## 2023-03-10 ENCOUNTER — Encounter: Payer: Medicare Other | Admitting: Physician Assistant

## 2023-03-10 LAB — GLUCOSE, CAPILLARY: Glucose-Capillary: 184 mg/dL — ABNORMAL HIGH (ref 70–99)

## 2023-03-10 NOTE — Progress Notes (Signed)
[name] ([id number]) [id number] Page 1 of 2 Visit Report for 03/06/2023 HBO Details Patient Name: Date of Service: [name], [name] 03/06/2023 11:30 A M Medical Record Number: [id number] Patient Account Number: [id number] Date of Birth/Sex: Treating RN: 1951/03/12 (72 y.o. M) Primary Care Teofila Bowery: [name] Other Clinician: Referring Ofilia Rayon: Treating Feven Alderfer/Extender: [name] in Treatment: 5 HBO Treatment Course Details Treatment Course Number: 1 Ordering Lariah Fleer: [name] T Treatments Ordered: otal 40 HBO Treatment Start Date: 02/24/2023 HBO Indication: Other (specify in Notes) HBO Treatment Details Treatment Number: 7 Patient Type: Outpatient Chamber Type: Monoplace Chamber Serial #: [id number] Treatment Protocol: 2.0 ATA with 90 minutes oxygen, and no air breaks Treatment Details Compression Rate Down: 2.0 psi / minute De-Compression Rate Up: 1.5 psi / minute Air breaks and breathing Decompress Decompress Compress Tx Pressure Begins Reached periods Begins Ends (leave unused spaces blank) Chamber Pressure (ATA 1 2 ------2 1 ) Clock Time (24 hr) 11:26 11:43 - - - - - - 13:10 13:20 Treatment Length: 114 (minutes) Treatment Segments: 4 Vital Signs Capillary Blood Glucose Reference Range: 80 - 120 mg / dl HBO Diabetic Blood Glucose Intervention Range: <131 mg/dl or >188 mg/dl Type: Time Vitals Blood Respiratory Capillary Blood Glucose Pulse Action Pulse: Temperature: Taken: Pressure: Rate: Glucose (mg/dl): Meter #: Oximetry (%) Taken: Pre 11:26 153/77 61 18 97.8 250 1 99 none per protocol Post 13:20 142/80 60 18 98 1 98 none per protocol Treatment Response Treatment Toleration: Well Treatment Completion Status: Treatment Completed without Adverse Event Electronic Signature(s) Signed: 03/06/2023 1:43:20 PM By: [name] Signed: 03/09/2023 6:09:56 PM By: [name] Previous Signature:  03/06/2023 1:14:22 PM Version By: [name] Entered By: [name] on 03/06/2023 13:42:27 -------------------------------------------------------------------------------- HBO Safety Checklist Details Patient Name: Date of Service: [name], [name] 03/06/2023 11:30 A M Medical Record Number: [id number] Patient Account Number: [id number] Date of Birth/Sex: Treating RN: December 28, 1950 (72 y.o. M) Primary Care Ruthie Berch: [name] Other Clinician: Referring Lavern Crimi: Treating Marissah Vandemark/Extender: [name] in Treatment: 5 HBO Safety Checklist Items Safety Checklist Consent Form Signed DEEJAY, GOODRIDGE ([id number]) [id number] Page 2 of 2 Consent Form Signed Patient voided / foley secured and emptied When did you last eato 03/06/23 Last dose of injectable or oral agent 03/06/2023 gluagon Ostomy pouch emptied and vented if applicable NA All implantable devices assessed, documented and approved NA Intravenous access site secured and place NA Valuables secured Linens and cotton and cotton/polyester blend (less than 51% polyester) Personal oil-based products / skin lotions / body lotions removed Wigs or hairpieces removed NA Smoking or tobacco materials removed NA Books / newspapers / magazines / loose paper removed Cologne, aftershave, perfume and deodorant removed Jewelry removed (may wrap wedding band) Make-up removed Hair care products removed Battery operated devices (external) removed NA Heating patches and chemical warmers removed NA Titanium eyewear removed NA Nail polish cured greater than 10 hours NA Casting material cured greater than 10 hours NA Hearing aids removed NA Loose dentures or partials removed NA Prosthetics have been removed NA Patient demonstrates correct use of air break device (if applicable) Patient concerns have been addressed Patient grounding bracelet on and cord attached to  chamber Specifics for Inpatients (complete in addition to above) Medication sheet sent with patient Intravenous medications needed or due during therapy sent with patient Drainage tubes (e.g. nasogastric tube or chest tube secured and vented) Endotracheal or Tracheotomy tube secured Cuff deflated  of air and inflated with saline Airway suctioned Electronic Signature(s) Signed: 03/06/2023 1:43:20 PM By: [name] Entered By: [name] on 03/06/2023 13:15:08

## 2023-03-11 ENCOUNTER — Encounter: Payer: Medicare Other | Admitting: Internal Medicine

## 2023-03-12 ENCOUNTER — Encounter: Payer: Medicare Other | Admitting: Physician Assistant

## 2023-03-13 ENCOUNTER — Encounter: Payer: Medicare Other | Admitting: Physician Assistant

## 2023-03-13 DIAGNOSIS — E10621 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: Secondary | ICD-10-CM | POA: Diagnosis not present

## 2023-03-13 NOTE — Progress Notes (Signed)
IMRON, KUTZ ([id number]) [id number] Page 1 of 8 Visit Report for 03/13/2023 Chief Complaint Document Details Patient Name: Date of Service: [name], [name] 03/13/2023 10:45 A M Medical Record Number: [id number] Patient Account Number: [id number] Date of Birth/Sex: Treating RN: 02-20-1951 (72 y.o. M) Primary Care Provider: [name] Other Clinician: Referring Provider: Treating Provider/Extender: [name] in Treatment: 6 Information Obtained from: Patient Chief Complaint Bilateral foot ulcers Electronic Signature(s) Signed: 03/13/2023 10:36:44 AM By: [name] Entered By: [name] on 03/13/2023 10:36:44 -------------------------------------------------------------------------------- HPI Details Patient Name: Date of Service: [name], [name] 03/13/2023 10:45 A M Medical Record Number: [id number] Patient Account Number: [id number] Date of Birth/Sex: Treating RN: Oct 20, 1951 (72 y.o. M) Primary Care Provider: [name] Other Clinician: Referring Provider: Treating Provider/Extender: [name] in Treatment: 6 History of Present Illness HPI Description: 01-30-2023 upon evaluation today patient appears to be doing poorly currently in regard to his feet bilaterally. He is actually referral from Dr. Gala Lewandowsky who is a local podiatrist. Subsequently Dr. Logan Bores has been seeing him but wanted to refer him to [place] due to the fact that he is having difficulty healing in regard to his feet bilaterally. He has had several amputations. 1 back in the summer 2023 of the great toe right foot. Subsequently had a partial digit amputation of the second toe that was performed in November 2023. Subsequently since that time he had a hard time getting this to heal. Fortunately there does not appear to be any signs of infection has been using gentamicin currently as the treatment of choice. With that  being said I feel like that we need to try to see about drying some of these wounds off of it which is my biggest concern at this point. Also think that he could potentially have osteomyelitis of this right foot he has not had an x-ray of the left foot Emina suggest MRI right foot and x-ray left foot he is already had an x-ray of the right foot at Dr. Logan Bores office and that did not reveal obvious signs of osteomyelitis but he does have bone exposure in the distal portion of the second toe right foot digit. Patient does have a history significant for diabetes mellitus type 1, hypertension, peripheral vascular disease, chronic kidney disease stage IIIa. He also has undergone arterial study which was actually performed on 12-08-2021 and this shows that he actually has good arterial flow into the right lower extremity and left lower extremity for that matter. He had noncompressible ABIs but on the left he had a TBI of 0.68 and in regard to his flow it was actually better on the right even compared to the left with good pressures at all locations and again there does not appear to be any significant peripheral vascular disease that would prevent healing. 02-06-2023 upon evaluation today patient appears to be doing about the same in regard to his wounds. There is quite a bit of maceration of the great toe on the right foot does appear to be healed the remainder of the wounds all appear to be a little bit macerated. I think that he may need to change this dressing a little bit more frequently. We have been using a silver alginate dressing. 02-13-2023 upon evaluation today patient unfortunately is continuing to have some issues here with purulent drainage from his toes there is also an odor that both his wife and the nurse noted  although I was not able to detect this due to my lack of smell. Nonetheless I do believe that this is something where we need to be a little more aggressive than the treatment of the wound  to be perfectly honest. I discussed this with the patient today and he is in agreement with this plan. I am going to send in those antibiotics for him today. 02-20-2023 upon evaluation today patient appears to be doing decently well in regard to his wounds he is tolerating the antibiotics without complication and overall seems to be doing well in that regard. Fortunately I do not see any signs of active infection locally nor systemically at this time. 02-27-2023 upon evaluation today patient appears to be doing well currently in regard to his wound. He has been tolerating the dressing changes without complication. Fortunately there does not appear to be any signs of infection we are using silver alginate dressings and then subsequently the Band-Aids to secure in place. He also seems to be doing well with the hyperbarics at this point which is great news. 03-06-2023 upon evaluation today patient's wounds show signs of improvement although this is going slowly nonetheless we are making improvement here of the same. Fortunately I do not see any signs of active infection locally nor systemically which is great news. 03-13-2023 upon evaluation today patient appears to be doing well currently in regard to his wounds. He is actually showing signs of improvement he does have his tubes and so we should be good to get him started back on hyperbarics on Monday. Fortunately there does not appear to be any signs of active infection locally nor systemically which is great news. No fevers, chills, nausea, vomiting, or diarrhea. He does need refills of his medications he never got the prescription from [facility]. Electronic Signature(s) [name], [name] ([id number]) [id number] Page 2 of 8 Signed: 03/13/2023 11:14:41 AM By: [name] Entered By: [name] on 03/13/2023 11:14:40 -------------------------------------------------------------------------------- Physical Exam  Details Patient Name: Date of Service: [name], [name] 03/13/2023 10:45 A M Medical Record Number: [id number] Patient Account Number: [id number] Date of Birth/Sex: Treating RN: 04-16-1951 (72 y.o. M) Primary Care Provider: [name] Other Clinician: Referring Provider: Treating Provider/Extender: [name] in Treatment: 6 Constitutional Well-nourished and well-hydrated in no acute distress. Respiratory normal breathing without difficulty. Psychiatric this patient is able to make decisions and demonstrates good insight into disease process. Alert and Oriented x 3. pleasant and cooperative. Notes Upon inspection patient's wound bed actually showed signs of good granulation epithelization at this point. I actually do feel like will show improvement and will get a get back into hyperbarics he finally got his tubes and they seem to be doing well this is great news. Electronic Signature(s) Signed: 03/13/2023 11:14:57 AM By: [name] Entered By: [name] on 03/13/2023 11:14:57 -------------------------------------------------------------------------------- Physician Orders Details Patient Name: Date of Service: [name], [name] 03/13/2023 10:45 A M Medical Record Number: [id number] Patient Account Number: [id number] Date of Birth/Sex: Treating RN: October 08, 1951 (72 y.o. [name]) [name] Primary Care Provider: [name] Other Clinician: Referring Provider: Treating Provider/Extender: [name] in Treatment: 6 Verbal / Phone Orders: No Diagnosis Coding ICD-10 Coding Code Description M86.371 Chronic multifocal osteomyelitis, right ankle and foot E10.621 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 L97.522 Non-pressure chronic ulcer of other part of left foot with fat layer exposed L97.512 Non-pressure chronic ulcer of other part  of right foot with fat layer exposed I25.10 Atherosclerotic heart disease of native  coronary artery without angina pectoris I10 Essential (primary) hypertension I73.89 Other specified peripheral vascular diseases N18.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, stage 3a Follow-up Appointments Return Appointment in 1 week. Anesthetic (Use 'Patient Medications' Section for Anesthetic Order Entry) Lidocaine applied to wound bed Edema Control - Lymphedema / Segmental Compressive Device / Other Elevate, Exercise Daily and A void Standing for Long Periods of Time. Elevate legs to the level of the heart and pump ankles as often as possible Elevate leg(s) parallel to the floor when sitting. Off-Loading Open toe surgical shoe - right and left foot Lawrence [name], Lawrence [name] ([id number]) [id number] Page 3 of 8 Hyperbaric Oxygen Therapy Wound #1 Right T Second oe Evaluate for HBO Therapy Indication and location: - Multifocal bilateral foot osteomyelitis with Wagoner Grade 3 ulcers If appropriate for treatment, begin HBOT per protocol: 2.0 ATA for 90 Minutes without A Breaks ir One treatment per day (delivered Monday through Friday unless otherwise specified in Special Instructions below): Total # of Treatments: - 40 A ntihistamine 30 minutes prior to HBO Treatment, difficulty clearing ears. Finger stick Blood Glucose Pre- and Post- HBOT Treatment. Follow Hyperbaric Oxygen Glycemia Protocol Wound #2 Right T Third oe Evaluate for HBO Therapy Indication and location: - Multifocal bilateral foot osteomyelitis with Wagoner Grade 3 ulcers If appropriate for treatment, begin HBOT per protocol: 2.0 ATA for 90 Minutes without A Breaks ir One treatment per day (delivered Monday through Friday unless otherwise specified in Special Instructions below): Total # of Treatments: - 40 A ntihistamine 30 minutes prior to HBO Treatment, difficulty clearing ears. Finger stick Blood Glucose Pre- and Post- HBOT Treatment. Follow Hyperbaric Oxygen Glycemia Protocol Wound #4 Left T  Third oe Evaluate for HBO Therapy Indication and location: - Multifocal bilateral foot osteomyelitis with Wagoner Grade 3 ulcers If appropriate for treatment, begin HBOT per protocol: 2.0 ATA for 90 Minutes without A Breaks ir One treatment per day (delivered Monday through Friday unless otherwise specified in Special Instructions below): Total # of Treatments: - 40 A ntihistamine 30 minutes prior to HBO Treatment, difficulty clearing ears. Finger stick Blood Glucose Pre- and Post- HBOT Treatment. Follow Hyperbaric Oxygen Glycemia Protocol Wound Treatment Wound #1 - T Second oe Wound Laterality: Right Cleanser: Soap and Water 1 x Per Day/30 Days Discharge Instructions: Gently cleanse wound with antibacterial soap, rinse and pat dry prior to dressing wounds Prim Dressing: Silvercel Small 2x2 (in/in) 1 x Per Day/30 Days ary Discharge Instructions: Apply Silvercel Small 2x2 (in/in) as instructed Secondary Dressing: Coverlet Latex-Free Fabric Adhesive Dressings 1 x Per Day/30 Days Discharge Instructions: 1.5 x 2 Wound #2 - T Third oe Wound Laterality: Right Cleanser: Soap and Water 1 x Per Day/30 Days Discharge Instructions: Gently cleanse wound with antibacterial soap, rinse and pat dry prior to dressing wounds Prim Dressing: Silvercel Small 2x2 (in/in) 1 x Per Day/30 Days ary Discharge Instructions: Apply Silvercel Small 2x2 (in/in) as instructed Secondary Dressing: Coverlet Latex-Free Fabric Adhesive Dressings 1 x Per Day/30 Days Discharge Instructions: 1.5 x 2 Wound #4 - T Third oe Wound Laterality: Left Cleanser: Soap and Water 1 x Per Day/30 Days Discharge Instructions: Gently cleanse wound with antibacterial soap, rinse and pat dry prior to dressing wounds Prim Dressing: Silvercel Small 2x2 (in/in) 1 x Per Day/30 Days ary Discharge Instructions: Apply Silvercel Small 2x2 (in/in) as instructed Secondary Dressing: Coverlet Latex-Free Fabric Adhesive Dressings 1 x Per Day/30  Days Discharge Instructions: 1.5 x  2 Patient Medications llergies: fluvastatin, nitroglycerin, rosuvastatin, simvastatin, ezetimibe, Lipitor, lisinopril A Notifications Medication Indication Start End 03/13/2023 doxycycline hyclate DOSE 1 - oral 100 mg capsule - 1 capsule oral twice a day x 30 days. Do not take calcium and magnesium at the same time as your antibiotic. 03/13/2023 Cipro DOSE 1 - oral 500 mg tablet - 1 tablet oral twice a day x 30 days 03/13/2023 metronidazole DOSE 1 - oral 500 mg tablet - 1 tablet oral twice a day x 30 days Lawrence [name], Lawrence [name] ([id number]) [id number] Page 4 of 8 GLYCEMIA INTERVENTIONS PROTOCOL PRE-HBO GLYCEMIA INTERVENTIONS ACTION INTERVENTION Obtain pre-HBO capillary blood glucose (ensure 1 physician order is in chart). A. Notify HBO physician and await physician orders. 2 If result is 70 mg/dl or below: B. If the result meets the hospital definition of a critical result, follow hospital policy. A. Give patient an 8 ounce Glucerna Shake, an 8 ounce Ensure, or 8 ounces of a Glucerna/Ensure equivalent dietary supplement*. B. Wait 30 minutes. If result is 71 mg/dl to 782 mg/dl: C. Retest patients capillary blood glucose (CBG). D. If result greater than or equal to 110 mg/dl, proceed with HBO. If result less than 110 mg/dl, notify HBO physician and consider holding HBO. If result is 131 mg/dl to 956 mg/dl: A. Proceed with HBO. A. Notify HBO physician and await physician orders. B. It is recommended to hold HBO and do If result is 250 mg/dl or greater: blood/urine ketone testing. C. If the result meets the hospital definition of a critical result, follow hospital policy. POST-HBO GLYCEMIA INTERVENTIONS ACTION INTERVENTION Obtain post HBO capillary blood glucose (ensure 1 physician order is in chart). A. Notify HBO physician and await physician orders. 2 If result is 70 mg/dl or below: B. If the result meets the  hospital definition of a critical result, follow hospital policy. A. Give patient an 8 ounce Glucerna Shake, an 8 ounce Ensure, or 8 ounces of a Glucerna/Ensure equivalent dietary supplement*. B. Wait 15 minutes for symptoms of If result is 71 mg/dl to 213 mg/dl: hypoglycemia (i.e. nervousness, anxiety, sweating, chills, clamminess, irritability, confusion, tachycardia or dizziness). C. If patient asymptomatic, discharge patient. If patient symptomatic, repeat capillary blood glucose (CBG) and notify HBO physician. If result is 101 mg/dl to 086 mg/dl: A. Discharge patient. A. Notify HBO physician and await physician orders. B. It is recommended to do blood/urine ketone If result is 250 mg/dl or greater: testing. C. If the result meets the hospital definition of a critical result, follow hospital policy. *Juice or candies are NOT equivalent products. If patient refuses the Glucerna or Ensure, please consult the hospital dietitian for an appropriate substitute. Electronic Signature(s) Signed: 03/13/2023 12:40:59 PM By: [name] Previous Signature: 03/13/2023 12:37:46 PM Version By: [name] RN Entered By: [name] on 03/13/2023 12:40:58 -------------------------------------------------------------------------------- Problem List Details Patient Name: Date of Service: [name], [name] 03/13/2023 10:45 A M Medical Record Number: [id number] Patient Account Number: [id number] Date of Birth/Sex: Treating RN: Mar 24, 1951 (72 y.o. M) Primary Care Provider: [name] Other Clinician: Referring Provider: Treating Provider/Extender: [name] in Treatment: 6 Active Problems ICD-10 Encounter Code Description Active Date MDM Diagnosis M86.371 Chronic multifocal osteomyelitis, right ankle and foot 02/13/2023 No Yes Lawrence [name], Lawrence [name] ([id number]) [id number] Page 5 of 8 E10.621 Type 1 diabetes mellitus with foot  ulcer 01/30/2023 No Yes L97.522 Non-pressure chronic ulcer of other part of left foot with fat layer exposed 01/30/2023 No Yes L97.512  Non-pressure chronic ulcer of other part of right foot with fat layer exposed 01/30/2023 No Yes I25.10 Atherosclerotic heart disease of native coronary artery without angina pectoris 01/30/2023 No Yes I10 Essential (primary) hypertension 01/30/2023 No Yes I73.89 Other specified peripheral vascular diseases 01/30/2023 No Yes N18.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, stage 3a 01/30/2023 No Yes Inactive Problems Resolved Problems Electronic Signature(s) Signed: 03/13/2023 10:36:25 AM By: [name] Entered By: [name] on 03/13/2023 10:36:25 -------------------------------------------------------------------------------- Progress Note Details Patient Name: Date of Service: [name], [name] 03/13/2023 10:45 A M Medical Record Number: [id number] Patient Account Number: [id number] Date of Birth/Sex: Treating RN: [birth year] (72 y.o. M) Primary Care Provider: [name] Other Clinician: Referring Provider: Treating Provider/Extender: [name] in Treatment: 6 Subjective Chief Complaint Information obtained from Patient Bilateral foot ulcers History of Present Illness (HPI) 01-30-2023 upon evaluation today patient appears to be doing poorly currently in regard to his feet bilaterally. He is actually referral from Dr. Gala Lewandowsky who is a local podiatrist. Subsequently Dr. Logan Bores has been seeing him but wanted to refer him to [place] due to the fact that he is having difficulty healing in regard to his feet bilaterally. He has had several amputations. 1 back in the summer 2023 of the great toe right foot. Subsequently had a partial digit amputation of the second toe that was performed in November 2023. Subsequently since that time he had a hard time getting this to heal. Fortunately there does not appear to be any signs of infection has been  using gentamicin currently as the treatment of choice. With that being said I feel like that we need to try to see about drying some of these wounds off of it which is my biggest concern at this point. Also think that he could potentially have osteomyelitis of this right foot he has not had an x-ray of the left foot Emina suggest MRI right foot and x-ray left foot he is already had an x-ray of the right foot at Dr. Logan Bores office and that did not reveal obvious signs of osteomyelitis but he does have bone exposure in the distal portion of the second toe right foot digit. Patient does have a history significant for diabetes mellitus type 1, hypertension, peripheral vascular disease, chronic kidney disease stage IIIa. He also has undergone arterial study which was actually performed on 12-08-2021 and this shows that he actually has good arterial flow into the right lower extremity and left lower extremity for that matter. He had noncompressible ABIs but on the left he had a TBI of 0.68 and in regard to his flow it was actually better on the right even compared to the left with good pressures at all locations and again there does not appear to be any significant peripheral vascular disease that would prevent healing. 02-06-2023 upon evaluation today patient appears to be doing about the same in regard to his wounds. There is quite a bit of maceration of the great toe on the right foot does appear to be healed the remainder of the wounds all appear to be a little bit macerated. I think that he may need to change this dressing a little bit more frequently. We have been using a silver alginate dressing. 02-13-2023 upon evaluation today patient unfortunately is continuing to have some issues here with purulent drainage from his toes there is also an odor that both his wife and the nurse noted although I was not able to detect this due  to my lack of smell. Nonetheless I do believe that this is something where we  need to be a little more aggressive than the treatment of the wound to be perfectly honest. I discussed this with the patient today and he is in agreement with this Lawrence [name], Lawrence [name] ([id number]) [id number] Page 6 of 8 plan. I am going to send in those antibiotics for him today. 02-20-2023 upon evaluation today patient appears to be doing decently well in regard to his wounds he is tolerating the antibiotics without complication and overall seems to be doing well in that regard. Fortunately I do not see any signs of active infection locally nor systemically at this time. 02-27-2023 upon evaluation today patient appears to be doing well currently in regard to his wound. He has been tolerating the dressing changes without complication. Fortunately there does not appear to be any signs of infection we are using silver alginate dressings and then subsequently the Band-Aids to secure in place. He also seems to be doing well with the hyperbarics at this point which is great news. 03-06-2023 upon evaluation today patient's wounds show signs of improvement although this is going slowly nonetheless we are making improvement here of the same. Fortunately I do not see any signs of active infection locally nor systemically which is great news. 03-13-2023 upon evaluation today patient appears to be doing well currently in regard to his wounds. He is actually showing signs of improvement he does have his tubes and so we should be good to get him started back on hyperbarics on Monday. Fortunately there does not appear to be any signs of active infection locally nor systemically which is great news. No fevers, chills, nausea, vomiting, or diarrhea. He does need refills of his medications he never got the prescription from [facility]. Objective Constitutional Well-nourished and well-hydrated in no acute distress. Vitals Time Taken: 10:36 AM, Temperature: 97.9 F, Pulse: 60 bpm,  Respiratory Rate: 18 breaths/min, Blood Pressure: 145/74 mmHg. Respiratory normal breathing without difficulty. Psychiatric this patient is able to make decisions and demonstrates good insight into disease process. Alert and Oriented x 3. pleasant and cooperative. General Notes: Upon inspection patient's wound bed actually showed signs of good granulation epithelization at this point. I actually do feel like will show improvement and will get a get back into hyperbarics he finally got his tubes and they seem to be doing well this is great news. Integumentary (Hair, Skin) Wound #1 status is Open. Original cause of wound was Gradually Appeared. The date acquired was: 11/03/2022. The wound has been in treatment 6 weeks. The wound is located on the Right T Second. The wound measures 1cm length x 0.6cm width x 0.3cm depth; 0.471cm^2 area and 0.141cm^3 volume. There is oe bone exposed. There is no tunneling or undermining noted. There is a medium amount of serosanguineous drainage noted. The wound margin is distinct with the outline attached to the wound base. There is small (1-33%) pink granulation within the wound bed. There is a large (67-100%) amount of necrotic tissue within the wound bed including Adherent Slough. Wound #2 status is Open. Original cause of wound was Gradually Appeared. The date acquired was: 11/03/2022. The wound has been in treatment 6 weeks. The wound is located on the Right T Third. The wound measures 0.5cm length x 1cm width x 0.1cm depth; 0.393cm^2 area and 0.039cm^3 volume. There is no oe tunneling or undermining noted. There is a medium amount of serosanguineous drainage noted. The wound margin is  distinct with the outline attached to the wound base. There is no granulation within the wound bed. There is a large (67-100%) amount of necrotic tissue within the wound bed including Adherent Slough. Wound #4 status is Open. Original cause of wound was Gradually Appeared. The date  acquired was: 12/05/2022. The wound has been in treatment 6 weeks. The wound is located on the Left T Third. The wound measures 1.5cm length x 2cm width x 0.2cm depth; 2.356cm^2 area and 0.471cm^3 volume. There is Fat oe Layer (Subcutaneous Tissue) exposed. There is a medium amount of serosanguineous drainage noted. Foul odor after cleansing was noted. The wound margin is distinct with the outline attached to the wound base. There is medium (34-66%) granulation within the wound bed. There is a medium (34-66%) amount of necrotic tissue within the wound bed including Adherent Slough. 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 Anesthetic (Use 'Patient Medications' Section for Anesthetic Order Entry): Lidocaine applied to wound bed Lawrence [name], Lawrence [name] ([id number]) [id number] Page 7 of 8 Edema Control - Lymphedema / Segmental Compressive Device / Other: Elevate, Exercise Daily and Avoid Standing for Long Periods of Time. Elevate legs to the level of the heart and pump ankles as often as possible Elevate leg(s) parallel to the floor when sitting. 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 Finger stick Blood Glucose Pre- and Post- HBOT Treatment. 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 Finger stick Blood Glucose Pre- and Post- HBOT Treatment. 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 Finger stick Blood Glucose Pre- and Post- HBOT Treatment. Follow Hyperbaric Oxygen Glycemia Protocol The following medication(s) was prescribed: doxycycline hyclate oral 100 mg capsule 1 1 capsule oral twice a day x 30 days. Do not take calcium and magnesium at the same time as your antibiotic. starting 03/13/2023 Cipro oral 500 mg tablet 1 1 tablet  oral twice a day x 30 days starting 03/13/2023 metronidazole oral 500 mg tablet 1 1 tablet oral twice a day x 30 days starting 03/13/2023 WOUND #1: - T Second Wound Laterality: Right oe Cleanser: Soap and Water 1 x Per Day/30  Days Discharge Instructions: Gently cleanse wound with antibacterial soap, rinse and pat dry prior to dressing wounds Prim Dressing: Silvercel Small 2x2 (in/in) 1 x Per Day/30 Days ary Discharge Instructions: Apply Silvercel Small 2x2 (in/in) as instructed Secondary Dressing: Coverlet Latex-Free Fabric Adhesive Dressings 1 x Per Day/30 Days Discharge Instructions: 1.5 x 2 WOUND #2: - T Third Wound Laterality: Right oe Cleanser: Soap and Water 1 x Per Day/30 Days Discharge Instructions: Gently cleanse wound with antibacterial soap, rinse and pat dry prior to dressing wounds Prim Dressing: Silvercel Small 2x2 (in/in) 1 x Per Day/30 Days ary Discharge Instructions: Apply Silvercel Small 2x2 (in/in) as instructed Secondary Dressing: Coverlet Latex-Free Fabric Adhesive Dressings 1 x Per Day/30 Days Discharge Instructions: 1.5 x 2 WOUND #4: - T Third Wound Laterality: Left oe Cleanser: Soap and Water 1 x Per Day/30 Days Discharge Instructions: Gently cleanse wound with antibacterial soap, rinse and pat dry prior to dressing wounds Prim Dressing: Silvercel Small 2x2 (in/in) 1 x Per Day/30 Days ary Discharge Instructions: Apply Silvercel Small 2x2 (in/in) as instructed Secondary Dressing: Coverlet Latex-Free Fabric Adhesive Dressings 1 x Per Day/30 Days Discharge Instructions: 1.5 x 2 1. Would recommend currently that the patient should continue to monitor for any evidence of infection or worsening. Based on what I am seeing I do believe that we are making good progress here. 2. I am also can recommend patient should continue with the silver cell in particular for all locations this is doing well as well. 3. We will get him back started with hyperbarics on Monday now that he has a tubes in place that should help to equalize the pressure and help with his headaches which she was having which were quite significant. We will see patient back for reevaluation in 1 week here in the clinic. If  anything worsens or changes patient will contact our office for additional recommendations. Electronic Signature(s) Signed: 03/13/2023 12:41:12 PM By: [name] Previous Signature: 03/13/2023 11:15:29 AM Version By: [name] Entered By: [name] on 03/13/2023 12:41:12 -------------------------------------------------------------------------------- SuperBill Details Patient Name: Date of Service: [name], [name] 03/13/2023 Medical Record Number: [id number] Patient Account Number: [id number] Lawrence [name], Lawrence [name] ([id number]) [id number] Page 8 of 8 Date of Birth/Sex: Treating RN: 1951-08-23 (72 y.o. 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 I10 Essential (primary) hypertension I73.89 Other specified peripheral vascular diseases N18.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, stage 3a Facility Procedures : CPT4 Code: [id number] Description: 99213 - WOUND CARE VISIT-LEV 3 EST PT Modifier: Quantity: 1 Physician Procedures : CPT4 Code Description Modifier [id number] - WC PHYS LEVEL 4 - EST PT ICD-10 Diagnosis Description M86.371 Chronic multifocal osteomyelitis, right ankle and foot E10.621 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

## 2023-03-13 NOTE — Progress Notes (Signed)
Lawrence [name] ([id number]) [id number] Page 1 of 10 Visit Report for 03/06/2023 Chief Complaint Document Details Patient Name: Date of Service: [name], [name] 03/06/2023 10:45 A M Medical Record Number: [id number] Patient Account Number: [id number] Date of Birth/Sex: Treating RN: 1951-06-10 (72 y.o. M) Primary Care Provider: [name] Other Clinician: Referring Provider: Treating Provider/Extender: [name] in Treatment: 5 Information Obtained from: Patient Chief Complaint Bilateral foot ulcers Electronic Signature(s) Signed: 03/06/2023 11:08:18 AM By: [name] Entered By: [name] on 03/06/2023 11:08:18 -------------------------------------------------------------------------------- Debridement Details Patient Name: Date of Service: [name], [name] 03/06/2023 10:45 A M Medical Record Number: [id number] Patient Account Number: [id number] Date of Birth/Sex: Treating RN: 1951-01-03 (72 y.o. [name]) [name] Primary Care Provider: [name] Other Clinician: Referring Provider: Treating Provider/Extender: [name] in Treatment: 5 Debridement Performed for Assessment: Wound #1 Right T Second oe Performed By: Physician [name], PA-C Debridement Type: Debridement Severity of Tissue Pre Debridement: Fat layer exposed Level of Consciousness (Pre-procedure): Awake and Alert Pre-procedure Verification/Time Out Yes - 10:57 Taken: Start Time: 10:57 Percent of Wound Bed Debrided: 100% T Area Debrided (cm): otal 0.47 Tissue and other material debrided: Viable, Non-Viable, Bone, Slough, Subcutaneous, Slough Level: Skin/Subcutaneous Tissue/Muscle/Bone Debridement Description: Excisional Instrument: Curette Bleeding: Minimum Hemostasis Achieved: Pressure End Time: 11:03 Procedural Pain: 0 Post Procedural Pain: 0 Response to Treatment: Procedure was tolerated well Level of  Consciousness (Post- Awake and Alert procedure): Post Debridement Measurements of Total Wound Length: (cm) 1 Width: (cm) 0.6 Depth: (cm) 0.3 Volume: (cm) 0.141 Character of Wound/Ulcer Post Debridement: Improved Severity of Tissue Post Debridement: Fat layer exposed Post Procedure Diagnosis Same as Pre-procedure Electronic Signature(s) Signed: 03/06/2023 11:36:48 AM By: [name] RN Signed: 03/06/2023 11:56:30 AM By: [name] Entered By: [name] on 03/06/2023 11:04:52 Lawrence [name], Lawrence [name] ([id number]) [id number] Page 2 of 10 -------------------------------------------------------------------------------- Debridement Details Patient Name: Date of Service: [name], [name] 03/06/2023 10:45 A M Medical Record Number: [id number] Patient Account Number: [id number] Date of Birth/Sex: Treating RN: 01-07-1951 (72 y.o. [name]) [name] Primary Care Provider: [name] Other Clinician: Referring Provider: Treating Provider/Extender: [name] in Treatment: 5 Debridement Performed for Assessment: Wound #2 Right T Third oe Performed By: Physician [name], PA-C Debridement Type: Debridement Severity of Tissue Pre Debridement: Fat layer exposed Level of Consciousness (Pre-procedure): Awake and Alert Pre-procedure Verification/Time Out Yes - 10:57 Taken: Start Time: 10:57 Percent of Wound Bed Debrided: 100% T Area Debrided (cm): otal 0.44 Tissue and other material debrided: Viable, Non-Viable, Bone, Slough, Subcutaneous, Slough Level: Skin/Subcutaneous Tissue/Muscle/Bone Debridement Description: Excisional Instrument: Curette Bleeding: Minimum Hemostasis Achieved: Pressure End Time: 11:03 Procedural Pain: 0 Post Procedural Pain: 0 Response to Treatment: Procedure was tolerated well Level of Consciousness (Post- Awake and Alert procedure): Post Debridement Measurements of Total Wound Length: (cm)  0.7 Width: (cm) 0.8 Depth: (cm) 0.1 Volume: (cm) 0.044 Character of Wound/Ulcer Post Debridement: Improved Severity of Tissue Post Debridement: Fat layer exposed Post Procedure Diagnosis Same as Pre-procedure Electronic Signature(s) Signed: 03/06/2023 11:36:48 AM By: [name] RN Signed: 03/06/2023 11:56:30 AM By: [name] Entered By: [name] on 03/06/2023 11:05:27 -------------------------------------------------------------------------------- Debridement Details Patient Name: Date of Service: [name], [name] 03/06/2023 10:45 A M Medical Record Number: [id number] Patient Account Number: [id number] Date of Birth/Sex: Treating RN: May 22, 1951 (72 y.o. M) Primary Care Provider: [name] Other Clinician: Referring  Provider: Treating Provider/Extender: [name] in Treatment: 5 Debridement Performed for Assessment: Wound #4 Left T Third oe Performed By: Physician [name], PA-C Debridement Type: Debridement Severity of Tissue Pre Debridement: Fat layer exposed Level of Consciousness (Pre-procedure): Awake and Alert Pre-procedure Verification/Time Out Yes - 10:57 Taken: Start Time: 10:57 Percent of Wound Bed Debrided: 100% T Area Debrided (cm): otal 1.88 Tissue and other material debrided: Viable, Non-Viable, Slough, Subcutaneous, Slough Level: Skin/Subcutaneous Tissue Debridement Description: Excisional Instrument: Lawrence [name], Lawrence [name] ([id number]) [id number] Page 3 of 10 Bleeding: Minimum Hemostasis Achieved: Pressure End Time: 11:03 Procedural Pain: 0 Post Procedural Pain: 0 Response to Treatment: Procedure was tolerated well Level of Consciousness (Post- Awake and Alert procedure): Post Debridement Measurements of Total Wound Length: (cm) 1.2 Width: (cm) 2 Depth: (cm) 0.2 Volume: (cm) 0.377 Character of Wound/Ulcer Post Debridement: Improved Severity of Tissue Post Debridement:  Fat layer exposed Post Procedure Diagnosis Same as Pre-procedure Electronic Signature(s) Signed: 03/06/2023 11:38:39 AM By: [name] Previous Signature: 03/06/2023 11:36:48 AM Version By: [name] RN Entered By: [name] on 03/06/2023 11:38:39 -------------------------------------------------------------------------------- HPI Details Patient Name: Date of Service: [name], [name] 03/06/2023 10:45 A M Medical Record Number: [id number] Patient Account Number: [id number] Date of Birth/Sex: Treating RN: [birth year] (72 y.o. M) Primary Care Provider: [name] Other Clinician: Referring Provider: Treating Provider/Extender: [name] in Treatment: 5 History of Present Illness HPI Description: 01-30-2023 upon evaluation today patient appears to be doing poorly currently in regard to his feet bilaterally. He is actually referral from Dr. Gala Lewandowsky who is a local podiatrist. Subsequently Dr. Logan Bores has been seeing him but wanted to refer him to [place] due to the fact that he is having difficulty healing in regard to his feet bilaterally. He has had several amputations. 1 back in the summer 2023 of the great toe right foot. Subsequently had a partial digit amputation of the second toe that was performed in November 2023. Subsequently since that time he had a hard time getting this to heal. Fortunately there does not appear to be any signs of infection has been using gentamicin currently as the treatment of choice. With that being said I feel like that we need to try to see about drying some of these wounds off of it which is my biggest concern at this point. Also think that he could potentially have osteomyelitis of this right foot he has not had an x-ray of the left foot Emina suggest MRI right foot and x-ray left foot he is already had an x-ray of the right foot at Dr. Logan Bores office and that did not reveal obvious signs of osteomyelitis but he does have  bone exposure in the distal portion of the second toe right foot digit. Patient does have a history significant for diabetes mellitus type 1, hypertension, peripheral vascular disease, chronic kidney disease stage IIIa. He also has undergone arterial study which was actually performed on 12-08-2021 and this shows that he actually has good arterial flow into the right lower extremity and left lower extremity for that matter. He had noncompressible ABIs but on the left he had a TBI of 0.68 and in regard to his flow it was actually better on the right even compared to the left with good pressures at all locations and again there does not appear to be any significant peripheral vascular disease that would prevent healing. 02-06-2023 upon evaluation today patient appears to be doing about  the same in regard to his wounds. There is quite a bit of maceration of the great toe on the right foot does appear to be healed the remainder of the wounds all appear to be a little bit macerated. I think that he may need to change this dressing a little bit more frequently. We have been using a silver alginate dressing. 02-13-2023 upon evaluation today patient unfortunately is continuing to have some issues here with purulent drainage from his toes there is also an odor that both his wife and the nurse noted although I was not able to detect this due to my lack of smell. Nonetheless I do believe that this is something where we need to be a little more aggressive than the treatment of the wound to be perfectly honest. I discussed this with the patient today and he is in agreement with this plan. I am going to send in those antibiotics for him today. 02-20-2023 upon evaluation today patient appears to be doing decently well in regard to his wounds he is tolerating the antibiotics without complication and overall seems to be doing well in that regard. Fortunately I do not see any signs of active infection locally nor systemically  at this time. 02-27-2023 upon evaluation today patient appears to be doing well currently in regard to his wound. He has been tolerating the dressing changes without complication. Fortunately there does not appear to be any signs of infection we are using silver alginate dressings and then subsequently the Band-Aids to secure in place. He also seems to be doing well with the hyperbarics at this point which is great news. 03-06-2023 upon evaluation today patient's wounds show signs of improvement although this is going slowly nonetheless we are making improvement here of the same. Fortunately I do not see any signs of active infection locally nor systemically which is great news. Electronic Signature(s) Signed: 03/06/2023 11:36:38 AM By: [name] Entered By: [name] on 03/06/2023 11:36:38 Lawrence [name], Lawrence [name] ([id number]) [id number] Page 4 of 10 -------------------------------------------------------------------------------- Physical Exam Details Patient Name: Date of Service: [name], [name] 03/06/2023 10:45 A M Medical Record Number: [id number] Patient Account Number: [id number] Date of Birth/Sex: Treating RN: [birth year] (72 y.o. M) Primary Care Provider: [name] Other Clinician: Referring Provider: Treating Provider/Extender: [name] in Treatment: 5 Constitutional Well-nourished and well-hydrated in no acute distress. Respiratory normal breathing without difficulty. Psychiatric this patient is able to make decisions and demonstrates good insight into disease process. Alert and Oriented x 3. pleasant and cooperative. Notes Upon inspection patient's wound bed actually showed signs of the need for sharp debridement I did perform debridement at all 3 locations and he tolerated this without complication. Postdebridement all 3 wounds look better I did using silver nitrate on the right second toe. Electronic  Signature(s) Signed: 03/06/2023 11:36:53 AM By: [name] Entered By: [name] on 03/06/2023 11:36:53 -------------------------------------------------------------------------------- Physician Orders Details Patient Name: Date of Service: [name], [name] 03/06/2023 10:45 A M Medical Record Number: [id number] Patient Account Number: [id number] Date of Birth/Sex: Treating RN: 19-Apr-1951 (72 y.o. 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 Anesthetic (Use 'Patient Medications' Section for Anesthetic Order Entry) Lidocaine applied to wound bed Edema Control - Lymphedema / Segmental Compressive Device / Other Elevate, Exercise Daily and A void Standing for Long Periods of Time. Elevate legs to the level of the heart and pump ankles as often as possible Elevate leg(s) parallel to the floor when sitting. 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 Finger stick Blood Glucose Pre- and Post- HBOT Treatment. Follow Hyperbaric Oxygen Lawrence Protocol Wound #2 Right T Third oe Evaluate for HBO Therapy Indication and location: - Multifocal bilateral foot osteomyelitis with Wagoner Grade 3 ulcers If appropriate for treatment, begin HBOT per protocol: 2.0 ATA for 90 Minutes without A Breaks ir One treatment per day (delivered Monday through Friday unless otherwise specified in Special Instructions below): Total # of Treatments: - 40 A ntihistamine 30 minutes prior to HBO Treatment, difficulty clearing ears. Finger stick Blood Glucose Pre- and Post- HBOT Treatment. 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 Finger stick Blood Glucose Pre- and Post- HBOT Treatment. Follow Hyperbaric Oxygen Lawrence Protocol Wound Treatment Wound #1 - T Second oe Wound Laterality: Right Cleanser: Soap and Water 1 x Per Day/30 Days Discharge Instructions: Gently cleanse wound with antibacterial soap, rinse and pat dry prior to dressing wounds Prim Dressing: Silvercel Small 2x2 (in/in) 1 x Per Day/30 Days ary Discharge Instructions: Apply Silvercel Small 2x2 (in/in) as instructed Secondary Dressing:  Coverlet Latex-Free Fabric Adhesive Dressings 1 x Per Day/30 Days Discharge Instructions: 1.5 x 2 Wound #2 - T Third oe Wound Laterality: Right Cleanser: Soap and Water 1 x Per Day/30 Days Discharge Instructions: Gently cleanse wound with antibacterial soap, rinse and pat dry prior to dressing wounds Prim Dressing: Silvercel Small 2x2 (in/in) 1 x Per Day/30 Days ary Discharge Instructions: Apply Silvercel Small 2x2 (in/in) as instructed Secondary Dressing: Coverlet Latex-Free Fabric Adhesive Dressings 1 x Per Day/30 Days Discharge Instructions: 1.5 x 2 Wound #4 - T Third oe Wound Laterality: Left Cleanser: Soap and Water 1 x Per Day/30 Days Discharge Instructions: Gently cleanse wound with antibacterial soap, rinse and pat dry prior to dressing wounds Prim Dressing: Silvercel Small 2x2 (in/in) 1 x Per Day/30 Days ary Discharge Instructions: Apply Silvercel Small 2x2 (in/in) as instructed Secondary Dressing: Coverlet Latex-Free Fabric Adhesive Dressings 1 x  Per Day/30 Days Discharge Instructions: 1.5 x 2 Lawrence [name] PROTOCOL PRE-HBO Lawrence [name] ACTION INTERVENTION Obtain pre-HBO capillary blood glucose (ensure 1 physician order is in chart). A. Notify HBO physician and await physician orders. 2 If result is 70 mg/dl or below: B. If the result meets the hospital definition of a critical result, follow hospital policy. A. Give patient an 8 ounce Glucerna Shake, an 8 ounce Ensure, or 8 ounces of a Glucerna/Ensure equivalent dietary supplement*. B. Wait 30 minutes. If result is 71 mg/dl to 161 mg/dl: C. Retest patients capillary blood glucose (CBG). D. If result greater than or equal to 110 mg/dl, proceed with HBO. If result less than 110 mg/dl, notify HBO physician and consider holding HBO. Lawrence [name] ([id number]) [id number] Page 6 of 10 If result is 131 mg/dl to 811 mg/dl: A. Proceed with HBO. A. Notify HBO physician  and await physician orders. B. It is recommended to hold HBO and do If result is 250 mg/dl or greater: blood/urine ketone testing. C. If the result meets the hospital definition of a critical result, follow hospital policy. POST-HBO Lawrence [name] ACTION INTERVENTION Obtain post HBO capillary blood glucose (ensure 1 physician order is in chart). A. Notify HBO physician and await physician orders. 2 If result is 70 mg/dl or below: B. If the result meets the hospital definition of a critical result, follow hospital policy. A. Give patient an 8 ounce Glucerna Shake, an 8 ounce Ensure, or 8 ounces of a Glucerna/Ensure equivalent dietary supplement*. B. Wait 15 minutes for symptoms of If result is 71 mg/dl to 914 mg/dl: hypoglycemia (i.e. nervousness, anxiety, sweating, chills, clamminess, irritability, confusion, tachycardia or dizziness). C. If patient asymptomatic, discharge patient. If patient symptomatic, repeat capillary blood glucose (CBG) and notify HBO physician. If result is 101 mg/dl to 782 mg/dl: A. Discharge patient. A. Notify HBO physician and await physician orders. B. It is recommended to do blood/urine ketone If result is 250 mg/dl or greater: testing. C. If the result meets the hospital definition of a critical result, follow hospital policy. *Juice or candies are NOT equivalent products. If patient refuses the Glucerna or Ensure, please consult the hospital dietitian for an appropriate substitute. Electronic Signature(s) Signed: 03/06/2023 11:36:48 AM By: [name] RN Signed: 03/06/2023 11:56:30 AM By: [name] Entered By: [name] on 03/06/2023 11:02:04 -------------------------------------------------------------------------------- Problem List Details Patient Name: Date of Service: [name], [name] 03/06/2023 10:45 A M Medical Record Number: [id number] Patient Account Number: [id number] Date of Birth/Sex: Treating RN: [birth year]  (72 y.o. M) Primary Care Provider: [name] Other Clinician: Referring Provider: Treating Provider/Extender: [name] in Treatment: 5 Active Problems ICD-10 Encounter Code Description Active Date MDM Diagnosis M86.371 Chronic multifocal osteomyelitis, right ankle and foot 02/13/2023 No Yes E10.621 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 01/30/2023 No Yes L97.522 Non-pressure chronic ulcer of other part of left foot with fat layer exposed 01/30/2023 No Yes L97.512 Non-pressure chronic ulcer of other part of right foot with fat layer exposed 01/30/2023 No Yes I25.10 Atherosclerotic heart disease of native coronary artery without angina pectoris 01/30/2023 No Yes I10 Essential (primary) hypertension 01/30/2023 No Yes RITWIK, LAPINSKY ([id number]) [phone].pdf Page 7 of 10 I73.89 Other specified peripheral vascular diseases 01/30/2023 No Yes N18.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, stage 3a 01/30/2023 No Yes Inactive Problems Resolved Problems Electronic Signature(s) Signed: 03/06/2023 11:08:15 AM By: [name] Previous Signature: 03/06/2023 10:50:20 AM Version By: [name] Previous Signature: 03/06/2023  10:49:52 AM Version By: [name] Previous Signature: 03/06/2023 10:49:42 AM Version By: [name] Entered By: [name] on 03/06/2023 11:08:14 -------------------------------------------------------------------------------- Progress Note Details Patient Name: Date of Service: [name], [name] 03/06/2023 10:45 A M Medical Record Number: [id number] Patient Account Number: [id number] Date of Birth/Sex: Treating RN: [birth year] (72 y.o. M) Primary Care Provider: [name] Other Clinician: Referring Provider: Treating Provider/Extender: [name] in Treatment: 5 Subjective Chief Complaint Information obtained from Patient Bilateral foot ulcers History of Present Illness  (HPI) 01-30-2023 upon evaluation today patient appears to be doing poorly currently in regard to his feet bilaterally. He is actually referral from Dr. Gala Lewandowsky who is a local podiatrist. Subsequently Dr. Logan Bores has been seeing him but wanted to refer him to [place] due to the fact that he is having difficulty healing in regard to his feet bilaterally. He has had several amputations. 1 back in the summer 2023 of the great toe right foot. Subsequently had a partial digit amputation of the second toe that was performed in November 2023. Subsequently since that time he had a hard time getting this to heal. Fortunately there does not appear to be any signs of infection has been using gentamicin currently as the treatment of choice. With that being said I feel like that we need to try to see about drying some of these wounds off of it which is my biggest concern at this point. Also think that he could potentially have osteomyelitis of this right foot he has not had an x-ray of the left foot Emina suggest MRI right foot and x-ray left foot he is already had an x-ray of the right foot at Dr. Logan Bores office and that did not reveal obvious signs of osteomyelitis but he does have bone exposure in the distal portion of the second toe right foot digit. Patient does have a history significant for diabetes mellitus type 1, hypertension, peripheral vascular disease, chronic kidney disease stage IIIa. He also has undergone arterial study which was actually performed on 12-08-2021 and this shows that he actually has good arterial flow into the right lower extremity and left lower extremity for that matter. He had noncompressible ABIs but on the left he had a TBI of 0.68 and in regard to his flow it was actually better on the right even compared to the left with good pressures at all locations and again there does not appear to be any significant peripheral vascular disease that would prevent healing. 02-06-2023 upon evaluation  today patient appears to be doing about the same in regard to his wounds. There is quite a bit of maceration of the great toe on the right foot does appear to be healed the remainder of the wounds all appear to be a little bit macerated. I think that he may need to change this dressing a little bit more frequently. We have been using a silver alginate dressing. 02-13-2023 upon evaluation today patient unfortunately is continuing to have some issues here with purulent drainage from his toes there is also an odor that both his wife and the nurse noted although I was not able to detect this due to my lack of smell. Nonetheless I do believe that this is something where we need to be a little more aggressive than the treatment of the wound to be perfectly honest. I discussed this with the patient today and he is in agreement with this plan. I am going to  send in those antibiotics for him today. 02-20-2023 upon evaluation today patient appears to be doing decently well in regard to his wounds he is tolerating the antibiotics without complication and overall seems to be doing well in that regard. Fortunately I do not see any signs of active infection locally nor systemically at this time. 02-27-2023 upon evaluation today patient appears to be doing well currently in regard to his wound. He has been tolerating the dressing changes without complication. Fortunately there does not appear to be any signs of infection we are using silver alginate dressings and then subsequently the Band-Aids to secure in place. He also seems to be doing well with the hyperbarics at this point which is great news. 03-06-2023 upon evaluation today patient's wounds show signs of improvement although this is going slowly nonetheless we are making improvement here of the same. Fortunately I do not see any signs of active infection locally nor systemically which is great news. Lawrence [name] ([id number])  [id number] Page 8 of 10 Objective Constitutional Well-nourished and well-hydrated in no acute distress. Vitals Time Taken: 10:46 AM, Temperature: 97.5 F, Pulse: 61 bpm, Respiratory Rate: 18 breaths/min, Blood Pressure: 157/77 mmHg. Respiratory normal breathing without difficulty. Psychiatric this patient is able to make decisions and demonstrates good insight into disease process. Alert and Oriented x 3. pleasant and cooperative. General Notes: Upon inspection patient's wound bed actually showed signs of the need for sharp debridement I did perform debridement at all 3 locations and he tolerated this without complication. Postdebridement all 3 wounds look better I did using silver nitrate on the right second toe. Integumentary (Hair, Skin) Wound #1 status is Open. Original cause of wound was Gradually Appeared. The date acquired was: 11/03/2022. The wound has been in treatment 5 weeks. The wound is located on the Right T Second. The wound measures 1cm length x 0.6cm width x 0.3cm depth; 0.471cm^2 area and 0.141cm^3 volume. There is oe bone exposed. There is no tunneling or undermining noted. There is a medium amount of serosanguineous drainage noted. The wound margin is distinct with the outline attached to the wound base. There is small (1-33%) pink granulation within the wound bed. There is a large (67-100%) amount of necrotic tissue within the wound bed including Adherent Slough. Wound #2 status is Open. Original cause of wound was Gradually Appeared. The date acquired was: 11/03/2022. The wound has been in treatment 5 weeks. The wound is located on the Right T Third. The wound measures 0.7cm length x 0.8cm width x 0.1cm depth; 0.44cm^2 area and 0.044cm^3 volume. There is no oe tunneling or undermining noted. There is a medium amount of serosanguineous drainage noted. The wound margin is distinct with the outline attached to the wound base. There is no granulation  within the wound bed. There is a large (67-100%) amount of necrotic tissue within the wound bed including Adherent Slough. Wound #4 status is Open. Original cause of wound was Gradually Appeared. The date acquired was: 12/05/2022. The wound has been in treatment 5 weeks. The wound is located on the Left T Third. The wound measures 1.2cm length x 2cm width x 0.2cm depth; 1.885cm^2 area and 0.377cm^3 volume. There is no oe tunneling or undermining noted. There is a medium amount of serosanguineous drainage noted. Foul odor after cleansing was noted. The wound margin is distinct with the outline attached to the wound base. There is no granulation within the wound bed. There is a large (67-100%) amount of necrotic tissue within the  wound bed including Adherent Slough. 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 There was a Excisional Skin/Subcutaneous Tissue/Muscle/Bone Debridement with a total area of 0.47 sq cm performed by [name], PA-C. With the following instrument(s): Curette to remove Viable and Non-Viable tissue/material. Material removed includes Bone,Subcutaneous Tissue, and Slough. A time out was conducted at 10:57, prior to the start of the procedure. A Minimum amount of bleeding was controlled with Pressure. The procedure was tolerated well with a pain level of 0  throughout and a pain level of 0 following the procedure. Post Debridement Measurements: 1cm length x 0.6cm width x 0.3cm depth; 0.141cm^3 volume. Character of Wound/Ulcer Post Debridement is improved. Severity of Tissue Post Debridement is: Fat layer exposed. 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 There was a Excisional Skin/Subcutaneous Tissue/Muscle/Bone Debridement with a total area of 0.44 sq cm performed by [name], PA- C. With the following instrument(s): Curette to remove Viable and Non-Viable tissue/material. Material removed includes Bone,Subcutaneous Tissue, and Slough. A time out was conducted at 10:57, prior to the start of the procedure. A Minimum amount of bleeding was controlled with Pressure. The procedure was tolerated well with a pain level of 0 throughout and a pain level of 0 following the procedure. Post Debridement Measurements: 0.7cm length x 0.8cm width x 0.1cm depth; 0.044cm^3 volume. Character of Wound/Ulcer Post Debridement is improved. Severity of Tissue Post Debridement is: Fat layer exposed. Post procedure Diagnosis Wound #2: Same as Pre-Procedure Wound #4 Pre-procedure diagnosis of Wound #4 is a Diabetic Wound/Ulcer of the Lower Extremity located on the Left T Third .Severity of Tissue Pre Debridement is: oe Fat layer exposed. There was a Excisional Skin/Subcutaneous Tissue Debridement with a total area of 1.88 sq cm performed by [name], PA-C. With the following instrument(s): Curette to remove Viable and Non-Viable tissue/material. Material removed includes Subcutaneous Tissue and Slough and. A time out was conducted at 10:57, prior to the start of the procedure. A Minimum amount of bleeding was controlled with Pressure. The procedure was tolerated well  with a pain level of 0 throughout and a pain level of 0 following the procedure. Post Debridement Measurements: 1.2cm length x 2cm width x 0.2cm depth; Lawrence [name] ([id number]) [phone].pdf Page 9 of 10 0.377cm^3 volume. Character of Wound/Ulcer Post Debridement is improved. Severity of Tissue Post Debridement is: Fat layer exposed. Post procedure Diagnosis Wound #4: Same as Pre-Procedure Plan Follow-up Appointments: Return Appointment in 1 week. Anesthetic (Use 'Patient Medications' Section for Anesthetic Order Entry): Lidocaine applied to wound bed Edema Control - Lymphedema / Segmental Compressive Device / Other: Elevate, Exercise Daily and Avoid Standing for Long Periods of Time. Elevate legs to the level of the heart and pump ankles as often as possible Elevate leg(s) parallel to the floor  when sitting. Off-Loading: Open toe surgical shoe - right and left foot Hyperbaric Oxygen Therapy: Wound #1 Right T Second: oe Evaluate for HBO Therapy Indication and location: - Multifocal bilateral foot osteomyelitis with Wagoner Grade 3 ulcers If appropriate for treatment, begin HBOT per protocol: 2.0 ATA for 90 Minutes without Air Breaks One treatment per day (delivered Monday through Friday unless otherwise specified in Special Instructions below): T # of Treatments: - 40 otal Antihistamine 30 minutes prior to HBO Treatment, difficulty clearing ears. Finger stick Blood Glucose Pre- and Post- HBOT Treatment. Follow Hyperbaric Oxygen Lawrence Protocol Wound #2 Right T Third: oe Evaluate for HBO Therapy Indication and location: - Multifocal bilateral foot osteomyelitis with Wagoner Grade 3 ulcers If appropriate for treatment, begin HBOT per protocol: 2.0 ATA for 90 Minutes without Air Breaks One treatment per day (delivered Monday through Friday unless otherwise specified in Special Instructions below): T # of Treatments: - 40 otal Antihistamine 30  minutes prior to HBO Treatment, difficulty clearing ears. Finger stick Blood Glucose Pre- and Post- HBOT Treatment. Follow Hyperbaric Oxygen Lawrence Protocol Wound #4 Left T Third: oe Evaluate for HBO Therapy Indication and location: - Multifocal bilateral foot osteomyelitis with Wagoner Grade 3 ulcers If appropriate for treatment, begin HBOT per protocol: 2.0 ATA for 90 Minutes without Air Breaks One treatment per day (delivered Monday through Friday unless otherwise specified in Special Instructions below): T # of Treatments: - 40 otal Antihistamine 30 minutes prior to HBO Treatment, difficulty clearing ears. Finger stick Blood Glucose Pre- and Post- HBOT Treatment. Follow Hyperbaric Oxygen Lawrence Protocol WOUND #1: - T Second Wound Laterality: Right oe Cleanser: Soap and Water 1 x Per Day/30 Days Discharge Instructions: Gently cleanse wound with antibacterial soap, rinse and pat dry prior to dressing wounds Prim Dressing: Silvercel Small 2x2 (in/in) 1 x Per Day/30 Days ary Discharge Instructions: Apply Silvercel Small 2x2 (in/in) as instructed Secondary Dressing: Coverlet Latex-Free Fabric Adhesive Dressings 1 x Per Day/30 Days Discharge Instructions: 1.5 x 2 WOUND #2: - T Third Wound Laterality: Right oe Cleanser: Soap and Water 1 x Per Day/30 Days Discharge Instructions: Gently cleanse wound with antibacterial soap, rinse and pat dry prior to dressing wounds Prim Dressing: Silvercel Small 2x2 (in/in) 1 x Per Day/30 Days ary Discharge Instructions: Apply Silvercel Small 2x2 (in/in) as instructed Secondary Dressing: Coverlet Latex-Free Fabric Adhesive Dressings 1 x Per Day/30 Days Discharge Instructions: 1.5 x 2 WOUND #4: - T Third Wound Laterality: Left oe Cleanser: Soap and Water 1 x Per Day/30 Days Discharge Instructions: Gently cleanse wound with antibacterial soap, rinse and pat dry prior to dressing wounds Prim Dressing: Silvercel Small 2x2 (in/in) 1 x Per Day/30  Days ary Discharge Instructions: Apply Silvercel Small 2x2 (in/in) as instructed Secondary Dressing: Coverlet Latex-Free Fabric Adhesive Dressings 1 x Per Day/30 Days Discharge Instructions: 1.5 x 2 1. I would recommend currently that we have the patient continue to monitor for any signs of infection or worsening. Based on what I am seeing I do believe that we are moving in the right direction here. 2. I am also can recommend that we continue currently with the silver alginate dressing which I think is still doing a good job. 3. I am also going to suggest patient should continue with the coverlet to put over top of the dressings that is changing them as needed in between. We will see patient back for reevaluation in 1 week here in the clinic. If anything worsens or changes patient will contact  our office for additional recommendations. Electronic Signature(s) Signed: 03/13/2023 1:23:24 PM By: Lance Bosch, Lawrence [name] ([id number]) By: [name] [phone].pdf Page 10 of 10 Signed: 03/13/2023 1:23:24 PM Previous Signature: 03/06/2023 11:37:48 AM Version By: [name] Entered By: [name] on 03/13/2023 13:23:24 -------------------------------------------------------------------------------- SuperBill Details Patient Name: Date of Service: [name], [name] 03/06/2023 Medical Record Number: [id number] Patient Account Number: [id number] Date of Birth/Sex: Treating RN: November 28, 1950 (72 y.o. [name], [name] Primary Care Provider: [name] Other Clinician: Referring Provider: Treating Provider/Extender: [name] in Treatment: 5 Diagnosis Coding ICD-10 Codes Code Description [phone] Chronic multifocal osteomyelitis, right ankle and foot E10.621 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 L97.522 Non-pressure chronic ulcer of other part of left foot with fat layer exposed L97.512 Non-pressure chronic ulcer of other part  of right foot with fat layer exposed I25.10 Atherosclerotic heart disease of native coronary artery without angina pectoris I10 Essential (primary) hypertension I73.89 Other specified peripheral vascular diseases N18.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, stage 3a Facility Procedures : CPT4 Code: [id number] 1 Description: 1042 - DEB SUBQ TISSUE 20 SQ CM/< ICD-10 Diagnosis Description 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 Modifier: Quantity: 1 : CPT4 Code: [id number] 1 Description: 1044 - DEB BONE 20 SQ CM/< ICD-10 Diagnosis Description 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 Modifier: Quantity: 1 Physician Procedures : CPT4: Description Modifier Code [id number] 11042 - WC PHYS SUBQ TISS 20 SQ CM ICD-10 Diagnosis Description L97.522 Non-pressure chronic ulcer of other part of left foot with fat layer exposed L97.512 Non-pressure chronic ulcer of other part of right foot  with fat layer exposed Quantity: 1 : CPT4: [id number] Debridement; bone (includes epidermis, dermis, subQ tissue, muscle and/or fascia, if performed) 1st 20 sqcm or less ICD-10 Diagnosis Description L97.522 Non-pressure chronic ulcer of other part of left foot with fat layer exposed L97.512  Non-pressure chronic ulcer of other part of right foot with fat layer exposed Quantity: 1 Electronic Signature(s) Signed: 03/13/2023 1:24:09 PM By: [name] Previous Signature: 03/06/2023 1:35:08 PM Version By: Elliot Gurney, BSN, RN, CWS, [name] RN, BSN Previous Signature: 03/09/2023 6:09:56 PM Version By: [name] Entered By: [name] on 03/13/2023 13:24:08

## 2023-03-13 NOTE — Progress Notes (Signed)
Lawrence [name] ([id number]) [id number] Page 1 of 10 Visit Report for 03/13/2023 Arrival Information Details Patient Name: Date of Service: [name], [name] 03/13/2023 10:45 A M Medical Record Number: [id number] Patient Account Number: [id number] Date of Birth/Sex: Treating RN: Sep 08, 1951 (72 y.o. Lawrence [name]) [name] Primary Care Journiee Feldkamp: [name] Other Clinician: Referring Kaveon Blatz: Treating Ronell Boldin/Extender: [name] in Treatment: 6 Visit Information History Since Last Visit Added or deleted any medications: No Patient Arrived: [name] Any new allergies or adverse reactions: No Arrival Time: 10:32 Had a fall or experienced change in No Accompanied By: wife activities of daily living that may affect Transfer Assistance: None risk of falls: Patient Identification Verified: Yes Signs or symptoms of abuse/neglect since last visito No Secondary Verification Process Completed: Yes Hospitalized since last visit: No Patient Requires Transmission-Based Precautions: No Implantable device outside of the clinic excluding No Patient Has Alerts: Yes cellular tissue based products placed in the center since last visit: Has Dressing in Place as Prescribed: Yes Pain Present Now: No Electronic Signature(s) Signed: 03/13/2023 12:37:46 PM By: [name] RN Entered By: [name] on 03/13/2023 10:36:32 -------------------------------------------------------------------------------- Clinic Level of Care Assessment Details Patient Name: Date of Service: [name], [name] 03/13/2023 10:45 A M Medical Record Number: [id number] Patient Account Number: [id number] Date of Birth/Sex: Treating RN: 11/28/1950 (72 y.o. Lawrence [name]) [name] Primary Care Joydan Gretzinger: [name] Other Clinician: Referring Miaa Latterell: Treating Jaryan Chicoine/Extender: [name] in Treatment: 6 Clinic Level of Care Assessment Items TOOL 4  Quantity Score X- 1 0 Use when only an EandM is performed on FOLLOW-UP visit ASSESSMENTS - Nursing Assessment / Reassessment X- 1 10 Reassessment of Co-morbidities (includes updates in patient status) X- 1 5 Reassessment of Adherence to Treatment Plan ASSESSMENTS - Wound and Skin A ssessment / Reassessment X - Simple Wound Assessment / Reassessment - one wound 1 5 []  - 0 Complex Wound Assessment / Reassessment - multiple wounds []  - 0 Dermatologic / Skin Assessment (not related to wound area) ASSESSMENTS - Focused Assessment []  - 0 Circumferential Edema Measurements - multi extremities []  - 0 Nutritional Assessment / Counseling / Intervention []  - 0 Lower Extremity Assessment (monofilament, tuning fork, pulses) []  - 0 Peripheral Arterial Disease Assessment (using hand held doppler) ASSESSMENTS - Ostomy and/or Continence Assessment and Care []  - 0 Incontinence Assessment and Management []  - 0 Ostomy Care Assessment and Management (repouching, etc.) PROCESS - Coordination of Care X - Simple Patient / Family Education for ongoing care 1 [address] ([id number]) [phone].pdf Page 2 of 10 []  - 0 Complex (extensive) Patient / Family Education for ongoing care []  - 0 Staff obtains Chiropractor, Records, T Results / Process Orders est []  - 0 Staff telephones HHA, Nursing Homes / Clarify orders / etc []  - 0 Routine Transfer to another Facility (non-emergent condition) []  - 0 Routine Hospital Admission (non-emergent condition) []  - 0 New Admissions / Manufacturing engineer / Ordering NPWT Apligraf, etc. , []  - 0 Emergency Hospital Admission (emergent condition) X- 1 10 Simple Discharge Coordination []  - 0 Complex (extensive) Discharge Coordination PROCESS - Special Needs []  - 0 Pediatric / Minor Patient Management []  - 0 Isolation Patient Management []  - 0 Hearing / Language / Visual special needs []  - 0 Assessment of Community assistance  (transportation, D/C planning, etc.) []  - 0 Additional assistance / Altered mentation []  - 0 Support Surface(s) Assessment (bed, cushion, seat, etc.) INTERVENTIONS - Wound Cleansing /  Measurement []  - 0 Simple Wound Cleansing - one wound X- 3 5 Complex Wound Cleansing - multiple wounds X- 1 5 Wound Imaging (photographs - any number of wounds) []  - 0 Wound Tracing (instead of photographs) []  - 0 Simple Wound Measurement - one wound X- 3 5 Complex Wound Measurement - multiple wounds INTERVENTIONS - Wound Dressings X - Small Wound Dressing one or multiple wounds 3 10 []  - 0 Medium Wound Dressing one or multiple wounds []  - 0 Large Wound Dressing one or multiple wounds []  - 0 Application of Medications - topical []  - 0 Application of Medications - injection INTERVENTIONS - Miscellaneous []  - 0 External ear exam []  - 0 Specimen Collection (cultures, biopsies, blood, body fluids, etc.) []  - 0 Specimen(s) / Culture(s) sent or taken to Lab for analysis []  - 0 Patient Transfer (multiple staff / Nurse, adult / Similar devices) []  - 0 Simple Staple / Suture removal (25 or less) []  - 0 Complex Staple / Suture removal (26 or more) []  - 0 Hypo / Hyperglycemic Management (close monitor of Blood Glucose) []  - 0 Ankle / Brachial Index (ABI) - do not check if billed separately X- 1 5 Vital Signs Has the patient been seen at the hospital within the last three years: Yes Total Score: 115 Level Of Care: New/Established - Level 3 Electronic Signature(s) Signed: 03/13/2023 12:37:46 PM By: [name] RN Entered By: [name] on 03/13/2023 11:11:40 Lawrence [name], Lawrence [name] ([id number]) [id number] Page 3 of 10 -------------------------------------------------------------------------------- Encounter Discharge Information Details Patient Name: Date of Service: [name], [name] 03/13/2023 10:45 A M Medical Record Number: [id number] Patient Account Number:  [id number] Date of Birth/Sex: Treating RN: 1951-01-25 (72 y.o. Lawrence [name]) [name] Primary Care Makiah Clauson: [name] Other Clinician: Referring Dom Haverland: Treating Demetric Parslow/Extender: [name] in Treatment: 6 Encounter Discharge Information Items Discharge Condition: Stable Ambulatory Status: Ambulatory Discharge Destination: Home Transportation: Private Auto Accompanied By: wife Schedule Follow-up Appointment: Yes Clinical Summary of Care: Electronic Signature(s) Signed: 03/13/2023 12:37:46 PM By: [name] RN Entered By: [name] on 03/13/2023 11:12:34 -------------------------------------------------------------------------------- Lower Extremity Assessment Details Patient Name: Date of Service: [name], [name] 03/13/2023 10:45 A M Medical Record Number: [id number] Patient Account Number: [id number] Date of Birth/Sex: Treating RN: 05/05/1951 (72 y.o. Lawrence [name]) [name] Primary Care Fritz Cauthon: [name] Other Clinician: Referring Kashara Blocher: Treating Lillah Standre/Extender: [name] in Treatment: 6 Electronic Signature(s) Signed: 03/13/2023 12:37:46 PM By: [name] RN Entered By: [name] on 03/13/2023 10:44:06 -------------------------------------------------------------------------------- Multi Wound Chart Details Patient Name: Date of Service: [name], [name] 03/13/2023 10:45 A M Medical Record Number: [id number] Patient Account Number: [id number] Date of Birth/Sex: Treating RN: 1951/10/19 (72 y.o. Lawrence [name]) [name] Primary Care Ahniyah Giancola: [name] Other Clinician: Referring Guy Toney: Treating Joshwa Hemric/Extender: [name] in Treatment: 6 Vital Signs Height(in): Pulse(bpm): 60 Weight(lbs): Blood Pressure(mmHg): 145/74 Body Mass Index(BMI): Temperature(F): 97.9 Respiratory Rate(breaths/min): 18 [1:Photos:] Lawrence [name], Lawrence [name] ([id number]) [1:Right T Second oe Wound  Location: Gradually Appeared Wounding Event: Diabetic Wound/Ulcer of the Lower Primary Etiology: Extremity Glaucoma, Coronary Artery Disease, Comorbid History: Hypertension, Myocardial Infarction, Type II Diabetes, End Stage  Renal Disease 11/03/2022 Date Acquired: 6 Weeks of Treatment: Open Wound Status: No Wound Recurrence: Yes Pending A mputation on Presentation: 1x0.6x0.3 Measurements L x W x D (cm) 0.471 A (cm) : rea 0.141 Volume (cm) : 40.00% % Reduction in A rea:  40.30% % Reduction  in Volume: Grade 3 Classification: Medium Exudate A mount: Serosanguineous Exudate Type: red, brown Exudate Color: No Foul Odor A Cleansing: fter N/A Odor A nticipated Due to Product Use: Distinct, outline attached Wound Margin: Small  (1-33%) Granulation A mount: Pink Granulation Quality: Large (67-100%) Necrotic A mount: Bone: Yes Exposed Structures: Fat Layer (Subcutaneous Tissue): No None Epithelialization:] [2:Right T Third oe Gradually Appeared Diabetic Wound/Ulcer of the Lower  Extremity Glaucoma, Coronary Artery Disease, Hypertension, Myocardial Infarction, Type II Diabetes, End Stage Renal Disease 11/03/2022 6 Open No Yes 0.5x1x0.1 0.393 0.039 16.60% 17.00% Grade 3 Medium Serosanguineous red, brown No N/A Distinct, outline  attached None Present (0%) N/A Large (67-100%) Fascia: No Fat Layer (Subcutaneous Tissue): No Tendon: No Muscle: No Joint: No Bone: No None] [[id number] Page 4 of 10 Left T Third Gradually Appeared Diabetic Wound/Ulcer of the  Lower Extremity Glaucoma, Coronary Artery Disease, Hypertension, Myocardial Infarction, Type II Diabetes, End Stage Renal Disease 12/05/2022 6 Open No Yes 1.5x2x0.2 2.356 0.471 -25.00% -24.90% Grade 3 Medium Serosanguineous red, brown Yes No Distinct,  outline attached Medium (34-66%) N/A Medium (34-66%) Fat Layer (Subcutaneous Tissue): Yes Fascia: No Tendon: No Muscle: No Joint: No Bone: No None] Treatment Notes Electronic Signature(s) Signed:  03/13/2023 12:37:46 PM By: [name] RN Entered By: [name] on 03/13/2023 10:44:13 -------------------------------------------------------------------------------- Multi-Disciplinary Care Plan Details Patient Name: Date of Service: Lawrence Householder MS, [name] 03/13/2023 10:45 A M Medical Record Number: [id number] Patient Account Number: [id number] Date of Birth/Sex: Treating RN: Jan 27, 1951 (72 y.o. Lawrence [name]) [name] Primary Care Hitesh Fouche: [name] Other Clinician: Referring Edy Mcbane: Treating Candie Gintz/Extender: [name] in Treatment: 6 Active Inactive Wound/Skin Impairment Nursing Diagnoses: Knowledge deficit related to ulceration/compromised skin integrity Goals: Patient/caregiver will verbalize understanding of skin care regimen Date Initiated: 01/30/2023 Target Resolution Date: 04/01/2023 Goal Status: Active Ulcer/skin breakdown will have a volume reduction of 30% by week 4 Date Initiated: 01/30/2023 Date Inactivated: 03/06/2023 Target Resolution Date: 03/02/2023 Goal Status: Unmet Unmet Reason: comorbidities Ulcer/skin breakdown will have a volume reduction of 50% by week 8 Date Initiated: 01/30/2023 Target Resolution Date: 04/01/2023 Goal Status: Active Ulcer/skin breakdown will have a volume reduction of 80% by week 12 Date Initiated: 01/30/2023 Target Resolution Date: 05/02/2023 Goal Status: Active Lawrence [name], Lawrence [name] ([id number]) [phone].pdf Page 5 of 10 Ulcer/skin breakdown will heal within 14 weeks Date Initiated: 01/30/2023 Target Resolution Date: 06/01/2023 Goal Status: Active Interventions: Assess patient/caregiver ability to obtain necessary supplies Assess patient/caregiver ability to perform ulcer/skin care regimen upon admission and as needed Assess ulceration(s) every visit Notes: Electronic Signature(s) Signed: 03/13/2023 12:37:46 PM By: [name] RN Entered By: [name] on 03/13/2023  10:44:34 -------------------------------------------------------------------------------- Pain Assessment Details Patient Name: Date of Service: Lawrence Householder MS, [name] 03/13/2023 10:45 A M Medical Record Number: [id number] Patient Account Number: [id number] Date of Birth/Sex: Treating RN: 07-Oct-1951 (72 y.o. Lawrence [name]) [name] Primary Care Jcion Buddenhagen: [name] Other Clinician: Referring Mertice Uffelman: Treating Jules Baty/Extender: [name] in Treatment: 6 Active Problems Location of Pain Severity and Description of Pain Patient Has Paino No Site Locations Pain Management and Medication Current Pain Management: Electronic Signature(s) Signed: 03/13/2023 12:37:46 PM By: [name] RN Entered By: [name] on 03/13/2023 10:36:55 -------------------------------------------------------------------------------- Patient/Caregiver Education Details Patient Name: Date of Service: Lawrence Householder MS, [name] 5/10/2024andnbsp10:45 A M Medical Record Number: [id number] Patient Account Number: [id number] Date of Birth/Gender: Treating RN: 06-05-1951 (72 y.o. [name] Primary Care Physician: [name] Other  Clinician: Referring Physician: Treating Physician/Extender: [name] in Treatment: 6 Education Assessment Lawrence [name], Lawrence [name] ([id number]) [id number] Page 6 of 10 Education Provided To: Patient Education Topics Provided Wound/Skin Impairment: Handouts: Caring for Your Ulcer Methods: Explain/Verbal Responses: State content correctly Electronic Signature(s) Signed: 03/13/2023 12:37:46 PM By: [name] RN Entered By: [name] on 03/13/2023 10:44:52 -------------------------------------------------------------------------------- Wound Assessment Details Patient Name: Date of Service: [name], [name] 03/13/2023 10:45 A M Medical Record Number: [id number] Patient Account Number: [id number] Date  of Birth/Sex: Treating RN: Jun 22, 1951 (72 y.o. Lawrence [name]) [name] Primary Care Enola Siebers: [name] Other Clinician: Referring Nicola Heinemann: Treating Walter Grima/Extender: [name] in Treatment: 6 Wound Status Wound Number: 1 Primary Diabetic Wound/Ulcer of the Lower Extremity Etiology: Wound Location: Right T Second oe Wound Open Wounding Event: Gradually Appeared Status: Date Acquired: 11/03/2022 Comorbid Glaucoma, Coronary Artery Disease, Hypertension, Myocardial Weeks Of Treatment: 6 History: Infarction, Type II Diabetes, End Stage Renal Disease Clustered Wound: No Pending Amputation On Presentation Photos Wound Measurements Length: (cm) 1 Width: (cm) 0.6 Depth: (cm) 0.3 Area: (cm) 0.471 Volume: (cm) 0.141 % Reduction in Area: 40% % Reduction in Volume: 40.3% Epithelialization: None Tunneling: No Undermining: No Wound Description Classification: Grade 3 Wound Margin: Distinct, outline attached Exudate Amount: Medium Exudate Type: Serosanguineous Exudate Color: red, brown Foul Odor After Cleansing: No Slough/Fibrino Yes Wound Bed Granulation Amount: Small (1-33%) Exposed Structure Granulation Quality: Pink Fat Layer (Subcutaneous Tissue) Exposed: No Necrotic Amount: Large (67-100%) Bone Exposed: Yes Necrotic Quality: Adherent Slough Treatment Notes Wound #1 (Toe Second) Wound Laterality: Right Lawrence [name], Lawrence [name] ([id number]) [id number] Page 7 of 10 Cleanser Soap and Water Discharge Instruction: Gently cleanse wound with antibacterial soap, rinse and pat dry prior to dressing wounds Peri-Wound Care Topical Primary Dressing Silvercel Small 2x2 (in/in) Discharge Instruction: Apply Silvercel Small 2x2 (in/in) as instructed Secondary Dressing Coverlet Latex-Free Fabric Adhesive Dressings Discharge Instruction: 1.5 x 2 Secured With Compression Wrap Compression Stockings Add-Ons Electronic Signature(s) Signed:  03/13/2023 12:37:46 PM By: [name] RN Entered By: [name] on 03/13/2023 10:42:58 -------------------------------------------------------------------------------- Wound Assessment Details Patient Name: Date of Service: [name], [name] 03/13/2023 10:45 A M Medical Record Number: [id number] Patient Account Number: [id number] Date of Birth/Sex: Treating RN: [birth year] (72 y.o. Lawrence [name]) [name] Primary Care Sumner Boesch: [name] Other Clinician: Referring Mariavictoria Nottingham: Treating Zi Newbury/Extender: [name] in Treatment: 6 Wound Status Wound Number: 2 Primary Diabetic Wound/Ulcer of the Lower Extremity Etiology: Wound Location: Right T Third oe Wound Open Wounding Event: Gradually Appeared Status: Date Acquired: 11/03/2022 Comorbid Glaucoma, Coronary Artery Disease, Hypertension, Myocardial Weeks Of Treatment: 6 History: Infarction, Type II Diabetes, End Stage Renal Disease Clustered Wound: No Pending Amputation On Presentation Photos Wound Measurements Length: (cm) 0.5 Width: (cm) 1 Depth: (cm) 0.1 Area: (cm) 0.393 Volume: (cm) 0.039 % Reduction in Area: 16.6% % Reduction in Volume: 17% Epithelialization: None Tunneling: No Undermining: No Wound Description Classification: Grade 3 Wound Margin: Distinct, outline attached Exudate Amount: Medium Exudate Type: Serosanguineous Lawrence [name], Lawrence [name] ([id number]) Exudate Color: red, brown Foul Odor After Cleansing: No Slough/Fibrino Yes [phone].pdf Page 8 of 10 Wound Bed Granulation Amount: None Present (0%) Exposed Structure Necrotic Amount: Large (67-100%) Fascia Exposed: No Necrotic Quality: Adherent Slough Fat Layer (Subcutaneous Tissue) Exposed: No Tendon Exposed: No Muscle Exposed: No Joint Exposed: No Bone Exposed: No Treatment Notes Wound #2 (Toe Third) Wound Laterality: Right Cleanser Soap and Water Discharge Instruction: Gently cleanse wound with  antibacterial soap, rinse and pat dry prior to dressing wounds Peri-Wound Care Topical Primary Dressing Silvercel Small 2x2 (in/in) Discharge Instruction: Apply Silvercel Small 2x2 (in/in) as instructed Secondary Dressing Coverlet Latex-Free Fabric Adhesive Dressings Discharge Instruction: 1.5 x 2 Secured With Compression Wrap Compression Stockings Add-Ons Electronic Signature(s) Signed: 03/13/2023 12:37:46 PM By: [name] RN Entered By: [name] on 03/13/2023 10:43:26 -------------------------------------------------------------------------------- Wound Assessment Details Patient Name: Date of Service: [name], [name] 03/13/2023 10:45 A M Medical Record Number: [id number] Patient Account Number: [id number] Date of Birth/Sex: Treating RN: 11/29/1950 (72 y.o. Lawrence [name]) [name] Primary Care Darienne Belleau: [name] Other Clinician: Referring Derrel Moore: Treating Lucile Hillmann/Extender: [name] in Treatment: 6 Wound Status Wound Number: 4 Primary Diabetic Wound/Ulcer of the Lower Extremity Etiology: Wound Location: Left T Third oe Wound Open Wounding Event: Gradually Appeared Status: Date Acquired: 12/05/2022 Comorbid Glaucoma, Coronary Artery Disease, Hypertension, Myocardial Weeks Of Treatment: 6 History: Infarction, Type II Diabetes, End Stage Renal Disease Clustered Wound: No Pending Amputation On Presentation Photos Lawrence [name], Lawrence [name] ([id number]) [id number] Page 9 of 10 Wound Measurements Length: (cm) 1.5 Width: (cm) 2 Depth: (cm) 0.2 Area: (cm) 2.356 Volume: (cm) 0.471 % Reduction in Area: -25% % Reduction in Volume: -24.9% Epithelialization: None Wound Description Classification: Grade 3 Wound Margin: Distinct, outline attached Exudate Amount: Medium Exudate Type: Serosanguineous Exudate Color: red, brown Foul Odor After Cleansing: Yes Due to Product Use: No Slough/Fibrino Yes Wound  Bed Granulation Amount: Medium (34-66%) Exposed Structure Necrotic Amount: Medium (34-66%) Fascia Exposed: No Necrotic Quality: Adherent Slough Fat Layer (Subcutaneous Tissue) Exposed: Yes Tendon Exposed: No Muscle Exposed: No Joint Exposed: No Bone Exposed: No Treatment Notes Wound #4 (Toe Third) Wound Laterality: Left Cleanser Soap and Water Discharge Instruction: Gently cleanse wound with antibacterial soap, rinse and pat dry prior to dressing wounds Peri-Wound Care Topical Primary Dressing Silvercel Small 2x2 (in/in) Discharge Instruction: Apply Silvercel Small 2x2 (in/in) as instructed Secondary Dressing Coverlet Latex-Free Fabric Adhesive Dressings Discharge Instruction: 1.5 x 2 Secured With Compression Wrap Compression Stockings Add-Ons Electronic Signature(s) Signed: 03/13/2023 12:37:46 PM By: [name] RN Entered By: [name] on 03/13/2023 10:43:54 -------------------------------------------------------------------------------- Vitals Details Patient Name: Date of Service: [name], [name] 03/13/2023 10:45 A M Medical Record Number: [id number] Patient Account Number: [id number] Lawrence [name], Lawrence [name] ([id number]) [phone].pdf Page 10 of 10 Date of Birth/Sex: Treating RN: 07-03-1951 (72 y.o. Lawrence [name]) [name] Primary Care Muaad Boehning: Other Clinician: [name] Referring Daniyal Tabor: Treating Ricard Faulkner/Extender: [name] in Treatment: 6 Vital Signs Time Taken: 10:36 Temperature (F): 97.9 Pulse (bpm): 60 Respiratory Rate (breaths/min): 18 Blood Pressure (mmHg): 145/74 Reference Range: 80 - 120 mg / dl Electronic Signature(s) Signed: 03/13/2023 12:37:46 PM By: [name] RN Entered By: [name] on 03/13/2023 10:36:50

## 2023-03-16 ENCOUNTER — Encounter: Payer: Medicare Other | Admitting: Physician Assistant

## 2023-03-16 DIAGNOSIS — E10621 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: Secondary | ICD-10-CM | POA: Diagnosis not present

## 2023-03-16 LAB — GLUCOSE, CAPILLARY
Glucose-Capillary: 306 mg/dL — ABNORMAL HIGH (ref 70–99)
Glucose-Capillary: 225 mg/dL — ABNORMAL HIGH (ref 70–99)

## 2023-03-17 ENCOUNTER — Encounter: Payer: Medicare Other | Admitting: Physician Assistant

## 2023-03-17 DIAGNOSIS — E10621 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: Secondary | ICD-10-CM | POA: Diagnosis not present

## 2023-03-17 LAB — GLUCOSE, CAPILLARY
Glucose-Capillary: 220 mg/dL — ABNORMAL HIGH (ref 70–99)
Glucose-Capillary: 227 mg/dL — ABNORMAL HIGH (ref 70–99)

## 2023-03-17 NOTE — Progress Notes (Signed)
Lawrence [name] ([id number]) [id number] Page 1 of 2 Visit Report for 03/17/2023 HBO Details Patient Name: Date of Service: Lawrence [name], Lawrence RLA ND W. 03/17/2023 1:30 PM Medical Record Number: [id number] Patient Account Number: [id number] Date of Birth/Sex: Treating RN: 04-18-1951 (72 y.o. M) Primary Care Dennisha Mouser: [name] Other Clinician: Referring Kaeleen Odom: Treating Indalecio Malmstrom/Extender: [name] in Treatment: 6 HBO Treatment Course Details Treatment Course Number: 1 Ordering Maryem Shuffler: [name] T Treatments Ordered: otal 40 HBO Treatment Start Date: 02/24/2023 HBO Indication: Other (specify in Notes) HBO Treatment Details Treatment Number: 9 Patient Type: Outpatient Chamber Type: Monoplace Chamber Serial #: [id number] Treatment Protocol: 2.0 ATA with 90 minutes oxygen, and no air breaks Treatment Details Compression Rate Down: 2.0 psi / minute De-Compression Rate Up: 1.5 psi / minute Air breaks and breathing Decompress Decompress Compress Tx Pressure Begins Reached periods Begins Ends (leave unused spaces blank) Chamber Pressure (ATA 1 2 ------2 1 ) Clock Time (24 hr) 13:50 14:02 - - - - - - 15:29 15:40 Treatment Length: 110 (minutes) Treatment Segments: 4 Vital Signs Capillary Blood Glucose Reference Range: 80 - 120 mg / dl HBO Diabetic Blood Glucose Intervention Range: <131 mg/dl or >782 mg/dl Type: Time Vitals Blood Respiratory Capillary Blood Glucose Pulse Action Pulse: Temperature: Taken: Pressure: Rate: Glucose (mg/dl): Meter #: Oximetry (%) Taken: Pre 01:26 140/64 67 18 98.1 220 1 98 none per protocol Post 15:40 146/66 62 18 98.1 227 1 99 none per protocol Treatment Response Treatment Toleration: Well Treatment Completion Status: Treatment Completed without Adverse Event Electronic Signature(s) Signed: 03/17/2023 4:24:45 PM By: [name] Signed: 03/17/2023 4:31:12 PM By: [name] Entered By:  [name] on 03/17/2023 16:23:53 -------------------------------------------------------------------------------- HBO Safety Checklist Details Patient Name: Date of Service: Lawrence [name], Lawrence RLA ND W. 03/17/2023 1:30 PM Medical Record Number: [id number] Patient Account Number: [id number] Date of Birth/Sex: Treating RN: 12-Nov-1950 (72 y.o. M) Primary Care Cartrell Bentsen: [name] Other Clinician: Referring Lajuane Leatham: Treating Ricketta Colantonio/Extender: [name] in Treatment: 6 HBO Safety Checklist Items Safety Checklist Consent Form Signed Lawrence [name], Lawrence [name] ([id number]) [id number] Page 2 of 2 Patient voided / foley secured and emptied When did you last eato 03/17/23 Last dose of injectable or oral agent 03/17/23 glucagon Ostomy pouch emptied and vented if applicable NA All implantable devices assessed, documented and approved NA Intravenous access site secured and place NA Valuables secured Linens and cotton and cotton/polyester blend (less than 51% polyester) Personal oil-based products / skin lotions / body lotions removed Wigs or hairpieces removed NA Smoking or tobacco materials removed NA Books / newspapers / magazines / loose paper removed Cologne, aftershave, perfume and deodorant removed Jewelry removed (may wrap wedding band) Make-up removed NA Hair care products removed Battery operated devices (external) removed NA Heating patches and chemical warmers removed NA Titanium eyewear removed Nail polish cured greater than 10 hours NA Casting material cured greater than 10 hours NA Hearing aids removed NA Loose dentures or partials removed NA Prosthetics have been removed NA Patient demonstrates correct use of air break device (if applicable) Patient concerns have been addressed Patient grounding bracelet on and cord attached to chamber Specifics for Inpatients (complete in addition to above) Medication sheet sent with  patient Intravenous medications needed or due during therapy sent with patient Drainage tubes (e.g. nasogastric tube or chest tube secured and vented) Endotracheal or Tracheotomy tube secured Cuff deflated of air and inflated with saline Airway suctioned Electronic Signature(s) Signed: 03/17/2023 4:24:45 PM  By: [name] Entered ByDemetria Pore on 03/17/2023 16:23:47

## 2023-03-17 NOTE — Progress Notes (Signed)
Lawrence [name] ([id number]) [id number] Page 1 of 2 Visit Report for 03/16/2023 HBO Details Patient Name: Date of Service: [name], [name] 03/16/2023 1:30 PM Medical Record Number: [id number] Patient Account Number: [id number] Date of Birth/Sex: Treating RN: 1951-04-17 (72 y.o. M) Primary Care [name]: [name] Other Clinician: [name]: Treating [name]/Extender: [name] in Treatment: 6 HBO Treatment Course Details Treatment Course Number: 1 Ordering [name]: [name] T Treatments Ordered: otal 40 HBO Treatment Start Date: 02/24/2023 HBO Indication: Other (specify in Notes) HBO Treatment Details Treatment Number: 8 Patient Type: Outpatient Chamber Type: Monoplace Chamber Serial #: [id number] Treatment Protocol: 2.0 ATA with 90 minutes oxygen, and no air breaks Treatment Details Compression Rate Down: 2.0 psi / minute De-Compression Rate Up: 1.5 psi / minute Air breaks and breathing Decompress Decompress Compress Tx Pressure Begins Reached periods Begins Ends (leave unused spaces blank) Chamber Pressure (ATA 1 2 ------2 1 ) Clock Time (24 hr) 13:46 14:00 - - - - - - 15:31 15:40 Treatment Length: 114 (minutes) Treatment Segments: 4 Vital Signs Capillary Blood Glucose Reference Range: 80 - 120 mg / dl HBO Diabetic Blood Glucose Intervention Range: <131 mg/dl or >782 mg/dl Type: Time Vitals Blood Respiratory Capillary Blood Glucose Pulse Action Pulse: Temperature: Taken: Pressure: Rate: Glucose (mg/dl): Meter #: Oximetry (%) Taken: Pre 01:40 124/80 62 18 97.8 306 1 99 none per protocol Post 15:40 118/80 58 18 98.1 225 1 98 none per protocol Treatment Response Treatment Toleration: Well Treatment Completion Status: Treatment Completed without Adverse Event Electronic Signature(s) Signed: 03/16/2023 4:14:54 PM By: [name] Signed: 03/16/2023 5:47:09 PM By: [name] Entered By:  [name] on 03/16/2023 16:11:35 -------------------------------------------------------------------------------- HBO Safety Checklist Details Patient Name: Date of Service: [name], [name] 03/16/2023 1:30 PM Medical Record Number: [id number] Patient Account Number: [id number] Date of Birth/Sex: Treating RN: September 29, 1951 (72 y.o. M) Primary Care [name]: [name] Other Clinician: Referring [name]: Treating [name]/Extender: [name] in Treatment: 6 HBO Safety Checklist Items Safety Checklist Consent Form Signed DAMION, JAUCH ([id number]) [id number] Page 2 of 2 Patient voided / foley secured and emptied When did you last eato 03/16/23 Last dose of injectable or oral agent 03/16/23 glucagon Ostomy pouch emptied and vented if applicable NA All implantable devices assessed, documented and approved NA Intravenous access site secured and place NA Valuables secured Linens and cotton and cotton/polyester blend (less than 51% polyester) Personal oil-based products / skin lotions / body lotions removed Wigs or hairpieces removed NA Smoking or tobacco materials removed NA Books / newspapers / magazines / loose paper removed NA Cologne, aftershave, perfume and deodorant removed NA Jewelry removed (may wrap wedding band) NA Make-up removed NA Hair care products removed Battery operated devices (external) removed NA Heating patches and chemical warmers removed NA Titanium eyewear removed NA Nail polish cured greater than 10 hours NA Casting material cured greater than 10 hours NA Hearing aids removed NA Loose dentures or partials removed NA Prosthetics have been removed NA Patient demonstrates correct use of air break device (if applicable) Patient concerns have been addressed Patient grounding bracelet on and cord attached to chamber Specifics for Inpatients (complete in addition to above) Medication  sheet sent with patient Intravenous medications needed or due during therapy sent with patient Drainage tubes (e.g. nasogastric tube or chest tube secured and vented) Endotracheal or Tracheotomy tube secured Cuff deflated of air and inflated with saline Airway suctioned Electronic Signature(s)  Signed: 03/16/2023 4:14:54 PM By: [name] Entered By: [name] on 03/16/2023 16:02:07

## 2023-03-18 ENCOUNTER — Encounter (HOSPITAL_BASED_OUTPATIENT_CLINIC_OR_DEPARTMENT_OTHER): Payer: Medicare Other | Admitting: Internal Medicine

## 2023-03-18 DIAGNOSIS — E10621 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: Secondary | ICD-10-CM

## 2023-03-18 DIAGNOSIS — M86371 Chronic multifocal osteomyelitis, right ankle and foot: Secondary | ICD-10-CM | POA: Diagnosis not present

## 2023-03-18 DIAGNOSIS — L97512 Non-pressure chronic ulcer of other part of right foot with fat layer exposed: Secondary | ICD-10-CM

## 2023-03-18 DIAGNOSIS — L97522 Non-pressure chronic ulcer of other part of left foot with fat layer exposed: Secondary | ICD-10-CM | POA: Diagnosis not present

## 2023-03-18 LAB — GLUCOSE, CAPILLARY
Glucose-Capillary: 175 mg/dL — ABNORMAL HIGH (ref 70–99)
Glucose-Capillary: 317 mg/dL — ABNORMAL HIGH (ref 70–99)

## 2023-03-19 ENCOUNTER — Encounter: Payer: Medicare Other | Admitting: Physician Assistant

## 2023-03-19 DIAGNOSIS — E10621 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: Secondary | ICD-10-CM | POA: Diagnosis not present

## 2023-03-19 LAB — GLUCOSE, CAPILLARY
Glucose-Capillary: 369 mg/dL — ABNORMAL HIGH (ref 70–99)
Glucose-Capillary: 245 mg/dL — ABNORMAL HIGH (ref 70–99)

## 2023-03-20 ENCOUNTER — Encounter: Payer: Medicare Other | Admitting: Physician Assistant

## 2023-03-20 DIAGNOSIS — E10621 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: Secondary | ICD-10-CM | POA: Diagnosis not present

## 2023-03-20 LAB — GLUCOSE, CAPILLARY
Glucose-Capillary: 246 mg/dL — ABNORMAL HIGH (ref 70–99)
Glucose-Capillary: 258 mg/dL — ABNORMAL HIGH (ref 70–99)

## 2023-03-20 NOTE — Progress Notes (Signed)
[name] ([id number]) [id number] Page 1 of 2 Visit Report for 03/18/2023 HBO Details Patient Name: Date of Service: [name], [name] 03/18/2023 1:30 PM Medical Record Number: [id number] Patient Account Number: [id number] Date of Birth/Sex: Treating RN: 06/14/1951 (72 y.o. M) Primary Care Nimco Bivens: [name] Other Clinician: Referring Chelsia Serres: Treating Ayala Ribble/Extender: Emilee Hero in Treatment: 6 HBO Treatment Course Details Treatment Course Number: 1 Ordering Rileigh Kawashima: [name] T Treatments Ordered: otal 40 HBO Treatment Start Date: 02/24/2023 HBO Indication: Other (specify in Notes) HBO Treatment Details Treatment Number: 10 Patient Type: Outpatient Chamber Type: Monoplace Chamber Serial #: [id number] Treatment Protocol: 2.0 ATA with 90 minutes oxygen, and no air breaks Treatment Details Compression Rate Down: 2.5 psi / minute De-Compression Rate Up: 1.5 psi / minute Air breaks and breathing Decompress Decompress Compress Tx Pressure Begins Reached periods Begins Ends (leave unused spaces blank) Chamber Pressure (ATA 1 2 ------2 1 ) Clock Time (24 hr) 13:45 13:59 - - - - - - 15:29 15:39 Treatment Length: 114 (minutes) Treatment Segments: 4 Vital Signs Capillary Blood Glucose Reference Range: 80 - 120 mg / dl HBO Diabetic Blood Glucose Intervention Range: <131 mg/dl or >782 mg/dl Type: Time Vitals Blood Respiratory Capillary Blood Glucose Pulse Action Pulse: Temperature: Taken: Pressure: Rate: Glucose (mg/dl): Meter #: Oximetry (%) Taken: Pre 01:40 122/80 63 18 97.8 175 1 99 none per protocol Post 15:39 118/80 64 18 98 317 1 99 none per protocol Treatment Response Treatment Toleration: Well Treatment Completion Status: Treatment Completed without Adverse Event HBO Attestation I certify that I supervised this HBO treatment in accordance with Medicare guidelines. A trained emergency response  team is readily available per Yes hospital policies and procedures. Continue HBOT as ordered. Yes Electronic Signature(s) Signed: 03/18/2023 4:17:46 PM By: Geralyn Corwin DO Entered By: Geralyn Corwin on 03/18/2023 16:02:32 -------------------------------------------------------------------------------- HBO Safety Checklist Details Patient Name: Date of Service: [name], [name] 03/18/2023 1:30 PM Medical Record Number: [id number] Patient Account Number: [id number] Date of Birth/Sex: Treating RN: Jun 07, 1951 (72 y.o. M) Primary Care Autrey Human: [name] Other Clinician: Referring Devan Babino: Treating Shelbey Spindler/Extender: Emilee Hero in Treatment: [address] [name] ([id number]) [id number] Page 2 of 2 HBO Safety Checklist Items Safety Checklist Consent Form Signed Patient voided / foley secured and emptied When did you last eato 03/18/23 Last dose of injectable or oral agent n/a Ostomy pouch emptied and vented if applicable NA All implantable devices assessed, documented and approved NA Intravenous access site secured and place NA Valuables secured Linens and cotton and cotton/polyester blend (less than 51% polyester) Personal oil-based products / skin lotions / body lotions removed Wigs or hairpieces removed NA Smoking or tobacco materials removed NA Books / newspapers / magazines / loose paper removed Cologne, aftershave, perfume and deodorant removed Jewelry removed (may wrap wedding band) Make-up removed NA Hair care products removed Battery operated devices (external) removed NA Heating patches and chemical warmers removed NA Titanium eyewear removed NA Nail polish cured greater than 10 hours NA Casting material cured greater than 10 hours NA Hearing aids removed NA Loose dentures or partials removed NA Prosthetics have been removed NA Patient demonstrates correct use of air break device (if  applicable) Patient concerns have been addressed Patient grounding bracelet on and cord attached to chamber Specifics for Inpatients (complete in addition to above) Medication sheet sent with patient Intravenous medications needed or due during therapy sent with patient Drainage tubes (e.g. nasogastric tube  or chest tube secured and vented) Endotracheal or Tracheotomy tube secured Cuff deflated of air and inflated with saline Airway suctioned Electronic Signature(s) Signed: 03/20/2023 7:50:23 AM By: [name] Entered By: [name] on 03/18/2023 15:47:11

## 2023-03-20 NOTE — Progress Notes (Signed)
Lawrence [name] ([id number]) [id number] Page 1 of 2 Visit Report for 03/19/2023 HBO Details Patient Name: Date of Service: [name], [name] 03/19/2023 1:30 PM Medical Record Number: [id number] Patient Account Number: [id number] Date of Birth/Sex: Treating RN: July 16, 1951 (72 y.o. M) Primary Care Lawrence [name]: Lawrence [name] Other Clinician: Referring Lawrence [name]: Treating Lawrence [name] in Treatment: 6 HBO Treatment Course Details Treatment Course Number: 1 Ordering Lawrence [name]: Lawrence [name] Treatments Ordered: otal 40 HBO Treatment Start Date: 02/24/2023 HBO Indication: Other (specify in Notes) HBO Treatment Details Treatment Number: 11 Patient Type: Outpatient Chamber Type: Monoplace Chamber Serial #: [id number] Treatment Protocol: 2.0 ATA with 90 minutes oxygen, and no air breaks Treatment Details Compression Rate Down: 2.0 psi / minute De-Compression Rate Up: 1.5 psi / minute Air breaks and breathing Decompress Decompress Compress Tx Pressure Begins Reached periods Begins Ends (leave unused spaces blank) Chamber Pressure (ATA 1 2 ------2 1 ) Clock Time (24 hr) 13:47 14:02 - - - - - - 15:30 15:40 Treatment Length: 113 (minutes) Treatment Segments: 4 Vital Signs Capillary Blood Glucose Reference Range: 80 - 120 mg / dl HBO Diabetic Blood Glucose Intervention Range: <131 mg/dl or >782 mg/dl Type: Time Vitals Blood Respiratory Capillary Blood Glucose Pulse Action Pulse: Temperature: Taken: Pressure: Rate: Glucose (mg/dl): Meter #: Oximetry (%) Taken: Pre 01:45 132/50 97 18 98.1 369 1 97 none per protocol Post 15:40 140/68 62 18 97.9 245 1 98 none per protocol Treatment Response Treatment Toleration: Well Treatment Completion Status: Treatment Completed without Adverse Event Electronic Signature(s) Signed: 03/19/2023 4:16:15 PM By: Lawrence [name] Signed: 03/19/2023 6:23:48 PM By: Lawrence Derry PA-C Entered By:  Lawrence [name] on 03/19/2023 16:14:53 -------------------------------------------------------------------------------- HBO Safety Checklist Details Patient Name: Date of Service: [name], [name] 03/19/2023 1:30 PM Medical Record Number: [id number] Patient Account Number: [id number] Date of Birth/Sex: Treating RN: [birth year] (72 y.o. M) Primary Care Gittel Mccamish: Lawrence [name] Other Clinician: Referring Marios Gaiser: Treating Jaydrian Corpening/Extender: Lawrence [name] in Treatment: 6 HBO Safety Checklist Items Safety Checklist Consent Form Signed Lawrence [name] ([id number]) [id number] Page 2 of 2 Patient voided / foley secured and emptied When did you last eato 03/19/23 Last dose of injectable or oral agent 03/19/23 glucagon Ostomy pouch emptied and vented if applicable NA All implantable devices assessed, documented and approved NA Intravenous access site secured and place NA Valuables secured Linens and cotton and cotton/polyester blend (less than 51% polyester) Personal oil-based products / skin lotions / body lotions removed Wigs or hairpieces removed NA Smoking or tobacco materials removed NA Books / newspapers / magazines / loose paper removed Cologne, aftershave, perfume and deodorant removed Jewelry removed (may wrap wedding band) Make-up removed NA Hair care products removed Battery operated devices (external) removed NA Heating patches and chemical warmers removed NA Titanium eyewear removed Nail polish cured greater than 10 hours NA Casting material cured greater than 10 hours NA Hearing aids removed NA Loose dentures or partials removed NA Prosthetics have been removed NA Patient demonstrates correct use of air break device (if applicable) Patient concerns have been addressed Patient grounding bracelet on and cord attached to chamber Specifics for Inpatients (complete in addition to above) Medication sheet sent with  patient Intravenous medications needed or due during therapy sent with patient Drainage tubes (e.g. nasogastric tube or chest tube secured and vented) Endotracheal or Tracheotomy tube secured Cuff deflated of air and inflated with saline Airway suctioned Electronic Signature(s) Signed: 03/19/2023 4:16:15 PM  By: Lawrence [name] Entered By: Lawrence [name] on 03/19/2023 16:13:52

## 2023-03-20 NOTE — Progress Notes (Signed)
Lawrence [name] ([id number]) [id number] Page 1 of 2 Visit Report for 03/20/2023 HBO Details Patient Name: Date of Service: [name], [name] 03/20/2023 11:40 A M Medical Record Number: [id number] Patient Account Number: [id number] Date of Birth/Sex: Treating RN: 1951-10-12 (72 y.o. M) Primary Care Lawrence [name]: Lawrence [name] Other Clinician: Referring Lawrence [name]: Treating Lawrence [name]/Extender: Lawrence [name] in Treatment: 7 HBO Treatment Course Details Treatment Course Number: 1 Ordering Lawrence [name]: Lawrence [name] T Treatments Ordered: otal 40 HBO Treatment Start Date: 02/24/2023 HBO Indication: Other (specify in Notes) HBO Treatment Details Treatment Number: 12 Patient Type: Outpatient Chamber Type: Monoplace Chamber Serial #: [id number] Treatment Protocol: 2.0 ATA with 90 minutes oxygen, and no air breaks Treatment Details Compression Rate Down: 2.0 psi / minute De-Compression Rate Up: 1.5 psi / minute Air breaks and breathing Decompress Decompress Compress Tx Pressure Begins Reached periods Begins Ends (leave unused spaces blank) Chamber Pressure (ATA 1 2 ------2 1 ) Clock Time (24 hr) 11:27 11:37 - - - - - - 13:08 13:18 Treatment Length: 111 (minutes) Treatment Segments: 4 Vital Signs Capillary Blood Glucose Reference Range: 80 - 120 mg / dl HBO Diabetic Blood Glucose Intervention Range: <131 mg/dl or >027 mg/dl Type: Time Vitals Blood Respiratory Capillary Blood Glucose Pulse Action Pulse: Temperature: Taken: Pressure: Rate: Glucose (mg/dl): Meter #: Oximetry (%) Taken: Pre 11:38 138/57 60 18 98 246 99 Post 13:20 130/80 60 18 97.8 176 1 none per protocol Treatment Response Treatment Toleration: Well Treatment Completion Status: Treatment Completed without Adverse Event Electronic Signature(s) Signed: 03/20/2023 1:25:46 PM By: Lawrence [name] Signed: 03/20/2023 3:11:21 PM By: Lawrence Derry PA-C Entered By: Lawrence [name] on 03/20/2023  13:24:51 -------------------------------------------------------------------------------- HBO Safety Checklist Details Patient Name: Date of Service: [name], [name] 03/20/2023 11:40 A M Medical Record Number: [id number] Patient Account Number: [id number] Date of Birth/Sex: Treating RN: June 16, 1951 (72 y.o. M) Primary Care Lawrence [name]: Lawrence [name] Other Clinician: Referring Lawrence [name]: Treating Lawrence [name]/Extender: Lawrence [name] in Treatment: 7 HBO Safety Checklist Items Safety Checklist Consent Form Signed Lawrence [name], Lawrence [name] ([id number]) [id number] Page 2 of 2 Patient voided / foley secured and emptied When did you last eato 03/20/23 Last dose of injectable or oral agent 03/20/23 glucogan Ostomy pouch emptied and vented if applicable NA All implantable devices assessed, documented and approved NA Intravenous access site secured and place NA Valuables secured Linens and cotton and cotton/polyester blend (less than 51% polyester) Personal oil-based products / skin lotions / body lotions removed Wigs or hairpieces removed NA Smoking or tobacco materials removed NA Books / newspapers / magazines / loose paper removed Cologne, aftershave, perfume and deodorant removed Jewelry removed (may wrap wedding band) Make-up removed NA Hair care products removed Battery operated devices (external) removed NA Heating patches and chemical warmers removed NA Titanium eyewear removed NA Nail polish cured greater than 10 hours NA Casting material cured greater than 10 hours NA Hearing aids removed NA Loose dentures or partials removed NA Prosthetics have been removed NA Patient demonstrates correct use of air break device (if applicable) Patient concerns have been addressed Patient grounding bracelet on and cord attached to chamber Specifics for Inpatients (complete in addition to above) Medication sheet sent with patient Intravenous  medications needed or due during therapy sent with patient Drainage tubes (e.g. nasogastric tube or chest tube secured and vented) Endotracheal or Tracheotomy tube secured Cuff deflated of air and inflated with saline Airway suctioned Electronic Signature(s) Signed: 03/20/2023 1:25:46 PM By: Lynnell Jude,  Tarri Glenn By: Lawrence [name] on 03/20/2023 13:23:52

## 2023-03-23 ENCOUNTER — Encounter: Payer: Medicare Other | Admitting: Physician Assistant

## 2023-03-23 DIAGNOSIS — E10621 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: Secondary | ICD-10-CM | POA: Diagnosis not present

## 2023-03-23 LAB — GLUCOSE, CAPILLARY
Glucose-Capillary: 141 mg/dL — ABNORMAL HIGH (ref 70–99)
Glucose-Capillary: 242 mg/dL — ABNORMAL HIGH (ref 70–99)

## 2023-03-24 ENCOUNTER — Encounter: Payer: Medicare Other | Admitting: Physician Assistant

## 2023-03-24 DIAGNOSIS — E10621 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: Secondary | ICD-10-CM | POA: Diagnosis not present

## 2023-03-24 LAB — GLUCOSE, CAPILLARY
Glucose-Capillary: 209 mg/dL — ABNORMAL HIGH (ref 70–99)
Glucose-Capillary: 218 mg/dL — ABNORMAL HIGH (ref 70–99)

## 2023-03-25 ENCOUNTER — Encounter (HOSPITAL_BASED_OUTPATIENT_CLINIC_OR_DEPARTMENT_OTHER): Payer: Medicare Other | Admitting: Internal Medicine

## 2023-03-25 DIAGNOSIS — L97522 Non-pressure chronic ulcer of other part of left foot with fat layer exposed: Secondary | ICD-10-CM | POA: Diagnosis not present

## 2023-03-25 DIAGNOSIS — E10621 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: Secondary | ICD-10-CM

## 2023-03-25 DIAGNOSIS — L97512 Non-pressure chronic ulcer of other part of right foot with fat layer exposed: Secondary | ICD-10-CM

## 2023-03-25 DIAGNOSIS — M86371 Chronic multifocal osteomyelitis, right ankle and foot: Secondary | ICD-10-CM | POA: Diagnosis not present

## 2023-03-25 LAB — GLUCOSE, CAPILLARY
Glucose-Capillary: 315 mg/dL — ABNORMAL HIGH (ref 70–99)
Glucose-Capillary: 278 mg/dL — ABNORMAL HIGH (ref 70–99)

## 2023-03-25 NOTE — Progress Notes (Signed)
Lawrence [name] ([id number]) [id number] Page 1 of 2 Visit Report for 03/24/2023 HBO Details Patient Name: Date of Service: [name], [name] 03/24/2023 1:30 PM Medical Record Number: [id number] Patient Account Number: [id number] Date of Birth/Sex: Treating RN: December 13, 1950 (72 y.o. M) Primary Care Rudine Rieger: [name] Other Clinician: Referring Huxton Glaus: Treating Jayel Scaduto/Extender: [name] in Treatment: 7 HBO Treatment Course Details Treatment Course Number: 1 Ordering Shontavia Mickel: [name] T Treatments Ordered: otal 40 HBO Treatment Start Date: 02/24/2023 HBO Indication: Other (specify in Notes) HBO Treatment Details Treatment Number: 14 Patient Type: Outpatient Chamber Type: Monoplace Chamber Serial #: [id number] Treatment Protocol: 2.0 ATA with 90 minutes oxygen, and no air breaks Treatment Details Compression Rate Down: 2.0 psi / minute De-Compression Rate Up: 1.5 psi / minute Air breaks and breathing Decompress Decompress Compress Tx Pressure Begins Reached periods Begins Ends (leave unused spaces blank) Chamber Pressure (ATA 1 2 ------2 1 ) Clock Time (24 hr) 13:38 13:48 - - - - - - 15:18 15:28 Treatment Length: 110 (minutes) Treatment Segments: 4 Vital Signs Capillary Blood Glucose Reference Range: 80 - 120 mg / dl HBO Diabetic Blood Glucose Intervention Range: <131 mg/dl or >782 mg/dl Type: Time Vitals Blood Respiratory Capillary Blood Glucose Pulse Action Pulse: Temperature: Taken: Pressure: Rate: Glucose (mg/dl): Meter #: Oximetry (%) Taken: Pre 01:30 122/80 67 18 98.1 209 1 99 none per protocol Post 15:29 130/80 68 18 98 218 1 70 none per protocol Treatment Response Treatment Toleration: Well Treatment Completion Status: Treatment Completed without Adverse Event Electronic Signature(s) Signed: 03/24/2023 4:38:31 PM By: [name] Signed: 03/25/2023 3:52:57 PM By: [name] Entered By:  [name] on 03/24/2023 15:51:28 -------------------------------------------------------------------------------- HBO Safety Checklist Details Patient Name: Date of Service: [name], [name] 03/24/2023 1:30 PM Medical Record Number: [id number] Patient Account Number: [id number] Date of Birth/Sex: Treating RN: October 18, 1951 (72 y.o. M) Primary Care Jovin Fester: [name] Other Clinician: Referring Warnell Rasnic: Treating Javonne Dorko/Extender: [name] in Treatment: 7 HBO Safety Checklist Items Safety Checklist Consent Form Signed Lawrence [name], Lawrence [name] ([id number]) [id number] Page 2 of 2 Patient voided / foley secured and emptied When did you last eato 03/24/23 Last dose of injectable or oral agent 03/24/23 glucogan Ostomy pouch emptied and vented if applicable NA All implantable devices assessed, documented and approved NA Intravenous access site secured and place NA Valuables secured Linens and cotton and cotton/polyester blend (less than 51% polyester) Personal oil-based products / skin lotions / body lotions removed Wigs or hairpieces removed NA Smoking or tobacco materials removed NA Books / newspapers / magazines / loose paper removed Cologne, aftershave, perfume and deodorant removed Jewelry removed (may wrap wedding band) Make-up removed NA Hair care products removed Battery operated devices (external) removed NA Heating patches and chemical warmers removed NA Titanium eyewear removed NA Nail polish cured greater than 10 hours NA Casting material cured greater than 10 hours NA Hearing aids removed NA Loose dentures or partials removed NA Prosthetics have been removed NA Patient demonstrates correct use of air break device (if applicable) Patient concerns have been addressed Patient grounding bracelet on and cord attached to chamber Specifics for Inpatients (complete in addition to above) Medication sheet sent with  patient Intravenous medications needed or due during therapy sent with patient Drainage tubes (e.g. nasogastric tube or chest tube secured and vented) Endotracheal or Tracheotomy tube secured Cuff deflated of air and inflated with saline Airway suctioned Electronic Signature(s) Signed: 03/25/2023 3:52:57  PM By: Francoise Ceo By: [name] on 03/24/2023 15:49:48

## 2023-03-26 ENCOUNTER — Encounter: Payer: Medicare Other | Admitting: Physician Assistant

## 2023-03-26 DIAGNOSIS — E10621 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: Secondary | ICD-10-CM | POA: Diagnosis not present

## 2023-03-26 LAB — GLUCOSE, CAPILLARY
Glucose-Capillary: 260 mg/dL — ABNORMAL HIGH (ref 70–99)
Glucose-Capillary: 172 mg/dL — ABNORMAL HIGH (ref 70–99)

## 2023-03-26 NOTE — Progress Notes (Signed)
[name] ([id number]) [id number] Page 1 of 2 Visit Report for 03/25/2023 HBO Details Patient Name: Date of Service: [name], [name] 03/25/2023 1:30 PM Medical Record Number: [id number] Patient Account Number: [id number] Date of Birth/Sex: Treating RN: [birth year] (72 y.o. M) Primary Care Deshayla Empson: [name] Other Clinician: Referring Amal Saiki: Treating Anayeli Arel/Extender: Emilee Hero in Treatment: 7 HBO Treatment Course Details Treatment Course Number: 1 Ordering Beck Cofer: [name] T Treatments Ordered: otal 40 HBO Treatment Start Date: 02/24/2023 HBO Indication: Other (specify in Notes) HBO Treatment Details Treatment Number: 15 Patient Type: Outpatient Chamber Type: Monoplace Chamber Serial #: [id number] Treatment Protocol: 2.0 ATA with 90 minutes oxygen, and no air breaks Treatment Details Compression Rate Down: 2.0 psi / minute De-Compression Rate Up: 1.5 psi / minute Air breaks and breathing Decompress Decompress Compress Tx Pressure Begins Reached periods Begins Ends (leave unused spaces blank) Chamber Pressure (ATA 1 2 ------2 1 ) Clock Time (24 hr) 13:49 13:59 - - - - - - 15:29 15:39 Treatment Length: 110 (minutes) Treatment Segments: 4 Vital Signs Capillary Blood Glucose Reference Range: 80 - 120 mg / dl HBO Diabetic Blood Glucose Intervention Range: <131 mg/dl or >324 mg/dl Type: Time Vitals Blood Respiratory Capillary Blood Glucose Pulse Action Pulse: Temperature: Taken: Pressure: Rate: Glucose (mg/dl): Meter #: Oximetry (%) Taken: Pre 01:31 124/80 70 18 97.9 315 1 99 none per protocol Post 15:40 126/70 72 18 98.1 278 1 98 none per protocol Treatment Response Treatment Completion Status: Treatment Completed without Adverse Event HBO Attestation I certify that I supervised this HBO treatment in accordance with Medicare guidelines. A trained emergency response team is readily available  per Yes hospital policies and procedures. Continue HBOT as ordered. Yes Electronic Signature(s) Signed: 03/25/2023 4:38:43 PM By: Geralyn Corwin DO Previous Signature: 03/25/2023 3:52:57 PM Version By: [name] Previous Signature: 03/25/2023 4:06:15 PM Version By: Geralyn Corwin DO Entered By: Geralyn Corwin on 03/25/2023 16:37:59 -------------------------------------------------------------------------------- HBO Safety Checklist Details Patient Name: Date of Service: [name], [name] 03/25/2023 1:30 PM Medical Record Number: [id number] Patient Account Number: [id number] Date of Birth/Sex: Treating RN: August 18, 1951 (72 y.o. M) Primary Care Uel Davidow: [name] Other Clinician: Referring Leamon Palau: Treating Jakobe Blau/Extender: Oswaldo Milian [place], [name] ([id number]) [id number] Page 2 of 2 Weeks in Treatment: 7 HBO Safety Checklist Items Safety Checklist Consent Form Signed Patient voided / foley secured and emptied When did you last eato 03/25/23 AM Last dose of injectable or oral agent 03/25/23 glucogan Ostomy pouch emptied and vented if applicable NA All implantable devices assessed, documented and approved NA Intravenous access site secured and place NA Valuables secured Linens and cotton and cotton/polyester blend (less than 51% polyester) Personal oil-based products / skin lotions / body lotions removed Wigs or hairpieces removed NA Smoking or tobacco materials removed NA Books / newspapers / magazines / loose paper removed Cologne, aftershave, perfume and deodorant removed Jewelry removed (may wrap wedding band) NA Make-up removed NA Hair care products removed NA Battery operated devices (external) removed NA Heating patches and chemical warmers removed NA Titanium eyewear removed NA Nail polish cured greater than 10 hours NA Casting material cured greater than 10 hours NA Hearing aids  removed NA Loose dentures or partials removed NA Prosthetics have been removed NA Patient demonstrates correct use of air break device (if applicable) Patient concerns have been addressed Patient grounding bracelet on and cord attached to chamber Specifics for Inpatients (complete in addition to above)  Medication sheet sent with patient Intravenous medications needed or due during therapy sent with patient Drainage tubes (e.g. nasogastric tube or chest tube secured and vented) Endotracheal or Tracheotomy tube secured Cuff deflated of air and inflated with saline Airway suctioned Electronic Signature(s) Signed: 03/25/2023 3:52:57 PM By: [name] Entered By: [name] on 03/25/2023 15:37:05

## 2023-03-26 NOTE — Progress Notes (Signed)
Lawrence [name] ([id number]) [id number] Page 1 of 2 Visit Report for 03/23/2023 HBO Details Patient Name: Date of Service: [name], [name] 03/23/2023 1:30 PM Medical Record Number: [id number] Patient Account Number: [id number] Date of Birth/Sex: Treating RN: 1951/02/21 (73 y.o. M) Primary Care Lawrence [name]: Lawrence [name] Other Clinician: Referring Lawrence [name]: Treating Lawrence [name]/Extender: Lawrence [name] in Treatment: 7 HBO Treatment Course Details Treatment Course Number: 1 Ordering Demetrius Mahler: Lawrence [name] T Treatments Ordered: otal 40 HBO Treatment Start Date: 02/24/2023 HBO Indication: Other (specify in Notes) HBO Treatment Details Treatment Number: 13 Patient Type: Outpatient Chamber Type: Monoplace Chamber Serial #: [id number] Treatment Protocol: 2.0 ATA with 90 minutes oxygen, and no air breaks Treatment Details Compression Rate Down: 2.0 psi / minute De-Compression Rate Up: 1.5 psi / minute Air breaks and breathing Decompress Decompress Compress Tx Pressure Begins Reached periods Begins Ends (leave unused spaces blank) Chamber Pressure (ATA 1 2 ------2 1 ) Clock Time (24 hr) 13:55 14:05 - - - - - - 15:35 15:46 Treatment Length: 111 (minutes) Treatment Segments: 4 Vital Signs Capillary Blood Glucose Reference Range: 80 - 120 mg / dl HBO Diabetic Blood Glucose Intervention Range: <131 mg/dl or >086 mg/dl Type: Time Vitals Blood Respiratory Capillary Blood Glucose Pulse Action Pulse: Temperature: Taken: Pressure: Rate: Glucose (mg/dl): Meter #: Oximetry (%) Taken: Pre 01:40 126/80 65 18 97.8 141 1 99 none per protocol Post 15:46 118/80 70 18 98 242 1 99 none per protocol Treatment Response Treatment Toleration: Well Treatment Completion Status: Treatment Completed without Adverse Event Electronic Signature(s) Signed: 03/23/2023 3:52:57 PM By: Lawrence [name] Signed: 03/25/2023 5:35:29 PM By: Lawrence Derry PA-C Entered By:  Lawrence [name] on 03/23/2023 15:52:04 -------------------------------------------------------------------------------- HBO Safety Checklist Details Patient Name: Date of Service: [name], [name] 03/23/2023 1:30 PM Medical Record Number: [id number] Patient Account Number: [id number] Date of Birth/Sex: Treating RN: 1951/02/03 (72 y.o. M) Primary Care Lawrence [name]: Lawrence [name] Other Clinician: Referring Lawrence [name]: Treating Lawrence [name]/Extender: Lawrence [name] in Treatment: 7 HBO Safety Checklist Items Safety Checklist Consent Form Signed Lawrence [name], Lawrence [name] ([id number]) [id number] Page 2 of 2 Patient voided / foley secured and emptied When did you last eato 03/23/23 Last dose of injectable or oral agent 03/23/23 glucogan Ostomy pouch emptied and vented if applicable NA All implantable devices assessed, documented and approved NA Intravenous access site secured and place NA Valuables secured Linens and cotton and cotton/polyester blend (less than 51% polyester) Personal oil-based products / skin lotions / body lotions removed Wigs or hairpieces removed NA Smoking or tobacco materials removed NA Books / newspapers / magazines / loose paper removed Cologne, aftershave, perfume and deodorant removed Jewelry removed (may wrap wedding band) Make-up removed NA Hair care products removed Battery operated devices (external) removed NA Heating patches and chemical warmers removed NA Titanium eyewear removed NA Nail polish cured greater than 10 hours NA Casting material cured greater than 10 hours NA Hearing aids removed NA Loose dentures or partials removed NA Prosthetics have been removed NA Patient demonstrates correct use of air break device (if applicable) Patient concerns have been addressed Patient grounding bracelet on and cord attached to chamber Specifics for Inpatients (complete in addition to above) Medication sheet sent with  patient Intravenous medications needed or due during therapy sent with patient Drainage tubes (e.g. nasogastric tube or chest tube secured and vented) Endotracheal or Tracheotomy tube secured Cuff deflated of air and inflated with saline Airway suctioned Electronic Signature(s) Signed: 03/23/2023 3:52:57  PM By: Lawrence [name] Entered By: Lawrence [name] on 03/23/2023 15:51:00

## 2023-03-27 ENCOUNTER — Encounter: Payer: Medicare Other | Admitting: Internal Medicine

## 2023-03-27 DIAGNOSIS — E10621 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: Secondary | ICD-10-CM | POA: Diagnosis not present

## 2023-03-27 LAB — GLUCOSE, CAPILLARY
Glucose-Capillary: 262 mg/dL — ABNORMAL HIGH (ref 70–99)
Glucose-Capillary: 318 mg/dL — ABNORMAL HIGH (ref 70–99)

## 2023-03-27 NOTE — Progress Notes (Signed)
Lawrence [name] ([id number]) [id number] Page 1 of 2 Visit Report for 03/26/2023 HBO Details Patient Name: Date of Service: [name], [name] 03/26/2023 1:30 PM Medical Record Number: [id number] Patient Account Number: [id number] Date of Birth/Sex: Treating RN: [birth year] (72 y.o. M) Primary Care [name]: [name] Other Clinician: Referring [name]: Treating [name]/Extender: [name] in Treatment: 7 HBO Treatment Course Details Treatment Course Number: 1 Ordering Reno Clasby: [name] T Treatments Ordered: otal 40 HBO Treatment Start Date: 02/24/2023 HBO Indication: Other (specify in Notes) HBO Treatment Details Treatment Number: 16 Patient Type: Outpatient Chamber Type: Monoplace Chamber Serial #: [id number] Treatment Protocol: 2.0 ATA with 90 minutes oxygen, and no air breaks Treatment Details Compression Rate Down: 2.0 psi / minute De-Compression Rate Up: 1.5 psi / minute Air breaks and breathing Decompress Decompress Compress Tx Pressure Begins Reached periods Begins Ends (leave unused spaces blank) Chamber Pressure (ATA 1 2 ------2 1 ) Clock Time (24 hr) 13:39 13:51 - - - - - - 15:22 15:32 Treatment Length: 113 (minutes) Treatment Segments: 4 Vital Signs Capillary Blood Glucose Reference Range: 80 - 120 mg / dl HBO Diabetic Blood Glucose Intervention Range: <131 mg/dl or >782 mg/dl Type: Time Vitals Blood Respiratory Capillary Blood Glucose Pulse Action Pulse: Temperature: Taken: Pressure: Rate: Glucose (mg/dl): Meter #: Oximetry (%) Taken: Pre 01:30 138/74 72 18 98 260 1 99 none per protocol Post 15:32 136/72 70 18 97.8 172 1 99 none per protocol Treatment Response Treatment Toleration: Well Treatment Completion Status: Treatment Completed without Adverse Event Electronic Signature(s) Signed: 03/26/2023 3:45:26 PM By: [name] Signed: 03/26/2023 4:01:19 PM By: [name] Entered By:  [name] on 03/26/2023 15:44:07 -------------------------------------------------------------------------------- HBO Safety Checklist Details Patient Name: Date of Service: [name], [name] 03/26/2023 1:30 PM Medical Record Number: [id number] Patient Account Number: [id number] Date of Birth/Sex: Treating RN: [birth year] (72 y.o. M) Primary Care [name]: [name] Other Clinician: Referring [name]: Treating [name]/Extender: [name] in Treatment: 7 HBO Safety Checklist Items Safety Checklist Consent Form Signed [name], [name] ([id number]) [id number] Page 2 of 2 Patient voided / foley secured and emptied When did you last eato 03/26/23 Last dose of injectable or oral agent 03/26/23 jardiance Ostomy pouch emptied and vented if applicable NA All implantable devices assessed, documented and approved NA Intravenous access site secured and place NA Valuables secured Linens and cotton and cotton/polyester blend (less than 51% polyester) Personal oil-based products / skin lotions / body lotions removed Wigs or hairpieces removed NA Smoking or tobacco materials removed NA Books / newspapers / magazines / loose paper removed Cologne, aftershave, perfume and deodorant removed Jewelry removed (may wrap wedding band) Make-up removed NA Hair care products removed Battery operated devices (external) removed NA Heating patches and chemical warmers removed NA Titanium eyewear removed Nail polish cured greater than 10 hours NA Casting material cured greater than 10 hours NA Hearing aids removed NA Loose dentures or partials removed NA Prosthetics have been removed NA Patient demonstrates correct use of air break device (if applicable) Patient concerns have been addressed Patient grounding bracelet on and cord attached to chamber Specifics for Inpatients (complete in addition to above) Medication sheet sent with  patient Intravenous medications needed or due during therapy sent with patient Drainage tubes (e.g. nasogastric tube or chest tube secured and vented) Endotracheal or Tracheotomy tube secured Cuff deflated of air and inflated with saline Airway suctioned Electronic Signature(s) Signed: 03/26/2023 3:45:26 PM  By: Francoise Ceo By: [name] on 03/26/2023 15:43:36

## 2023-03-27 NOTE — Progress Notes (Signed)
Lawrence [name] ([id number]) [id number] Page 1 of 2 Visit Report for 03/27/2023 HBO Details Patient Name: Date of Service: [name], [name] 03/27/2023 10:45 A M Medical Record Number: [id number] Patient Account Number: [id number] Date of Birth/Sex: Treating RN: 1951/05/09 (73 y.o. M) Primary Care Yeraldine Forney: [name] Other Clinician: Referring Reveca Desmarais: Treating Rodel Glaspy/Extender: RO BSO N, MICHA EL Mechele Claude in Treatment: 8 HBO Treatment Course Details Treatment Course Number: 1 Ordering Samella Lucchetti: [name] T Treatments Ordered: otal 40 HBO Treatment Start Date: 02/24/2023 HBO Indication: Other (specify in Notes) HBO Treatment Details Treatment Number: 17 Patient Type: Outpatient Chamber Type: Monoplace Chamber Serial #: [id number] Treatment Protocol: 2.0 ATA with 90 minutes oxygen, and no air breaks Treatment Details Compression Rate Down: 2.0 psi / minute De-Compression Rate Up: 1.5 psi / minute Air breaks and breathing Decompress Decompress Compress Tx Pressure Begins Reached periods Begins Ends (leave unused spaces blank) Chamber Pressure (ATA 1 2 ------2 1 ) Clock Time (24 hr) 11:10 11:22 - - - - - - 12:50 13:00 Treatment Length: 110 (minutes) Treatment Segments: 4 Vital Signs Capillary Blood Glucose Reference Range: 80 - 120 mg / dl HBO Diabetic Blood Glucose Intervention Range: <131 mg/dl or >324 mg/dl Type: Time Vitals Blood Respiratory Capillary Blood Glucose Pulse Action Pulse: Temperature: Taken: Pressure: Rate: Glucose (mg/dl): Meter #: Oximetry (%) Taken: Pre 10:45 148/73 71 18 97.5 262 1 99 none per protocol Post 01:10 130/80 70 18 98 318 1 none per protocol Treatment Response Treatment Toleration: Well Treatment Completion Status: Treatment Completed without Adverse Event Leibish Mcgregor Notes No concerns with treatment given. Patient was also seen for wound care review HBO Attestation I certify that I  supervised this HBO treatment in accordance with Medicare guidelines. A trained emergency response team is readily available per Yes hospital policies and procedures. Continue HBOT as ordered. Yes Electronic Signature(s) Signed: 03/27/2023 1:28:22 PM By: Baltazar Najjar MD Previous Signature: 03/27/2023 1:16:10 PM Version By: [name] Entered By: Baltazar Najjar on 03/27/2023 13:27:41 -------------------------------------------------------------------------------- HBO Safety Checklist Details Patient Name: Date of Service: [name], [name] 03/27/2023 10:45 A M Medical Record Number: [id number] Patient Account Number: [id number] Lawrence [name], Lawrence [name] ([id number]) [phone].pdf Page 2 of 2 Date of Birth/Sex: Treating RN: 1951-03-31 (72 y.o. M) Primary Care Dalyla Chui: Other Clinician: [name] Referring Briel Gallicchio: Treating Kemora Pinard/Extender: Chauncey Mann, MICHA EL Albertine Grates, Dorene Grebe in Treatment: 8 HBO Safety Checklist Items Safety Checklist Consent Form Signed Patient voided / foley secured and emptied When did you last eato 03/27/2023 Last dose of injectable or oral agent 03/27/23 gluacogan Ostomy pouch emptied and vented if applicable NA All implantable devices assessed, documented and approved NA Intravenous access site secured and place NA Valuables secured Linens and cotton and cotton/polyester blend (less than 51% polyester) Personal oil-based products / skin lotions / body lotions removed Wigs or hairpieces removed NA Smoking or tobacco materials removed NA Books / newspapers / magazines / loose paper removed Cologne, aftershave, perfume and deodorant removed Jewelry removed (may wrap wedding band) Make-up removed NA Hair care products removed Battery operated devices (external) removed NA Heating patches and chemical warmers removed NA Titanium eyewear removed NA Nail polish cured greater than 10 hours NA Casting material cured  greater than 10 hours NA Hearing aids removed NA Loose dentures or partials removed NA Prosthetics have been removed NA Patient demonstrates correct use of air break device (if applicable) Patient concerns have been addressed Patient grounding bracelet  on and cord attached to chamber Specifics for Inpatients (complete in addition to above) Medication sheet sent with patient Intravenous medications needed or due during therapy sent with patient Drainage tubes (e.g. nasogastric tube or chest tube secured and vented) Endotracheal or Tracheotomy tube secured Cuff deflated of air and inflated with saline Airway suctioned Electronic Signature(s) Signed: 03/27/2023 1:16:10 PM By: [name] Entered By: [name] on 03/27/2023 13:10:44

## 2023-03-31 ENCOUNTER — Encounter: Payer: Medicare Other | Admitting: Physician Assistant

## 2023-03-31 DIAGNOSIS — E10621 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: Secondary | ICD-10-CM | POA: Diagnosis not present

## 2023-03-31 LAB — GLUCOSE, CAPILLARY: Glucose-Capillary: 208 mg/dL — ABNORMAL HIGH (ref 70–99)

## 2023-04-01 ENCOUNTER — Encounter (HOSPITAL_BASED_OUTPATIENT_CLINIC_OR_DEPARTMENT_OTHER): Payer: Medicare Other | Admitting: Internal Medicine

## 2023-04-01 DIAGNOSIS — L97512 Non-pressure chronic ulcer of other part of right foot with fat layer exposed: Secondary | ICD-10-CM | POA: Diagnosis not present

## 2023-04-01 DIAGNOSIS — L97522 Non-pressure chronic ulcer of other part of left foot with fat layer exposed: Secondary | ICD-10-CM

## 2023-04-01 DIAGNOSIS — E10621 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: Secondary | ICD-10-CM | POA: Diagnosis not present

## 2023-04-01 DIAGNOSIS — M86371 Chronic multifocal osteomyelitis, right ankle and foot: Secondary | ICD-10-CM | POA: Diagnosis not present

## 2023-04-01 LAB — GLUCOSE, CAPILLARY
Glucose-Capillary: 148 mg/dL — ABNORMAL HIGH (ref 70–99)
Glucose-Capillary: 260 mg/dL — ABNORMAL HIGH (ref 70–99)
Glucose-Capillary: 240 mg/dL — ABNORMAL HIGH (ref 70–99)
Glucose-Capillary: 192 mg/dL — ABNORMAL HIGH (ref 70–99)

## 2023-04-02 ENCOUNTER — Encounter: Payer: Medicare Other | Admitting: Physician Assistant

## 2023-04-02 DIAGNOSIS — E10621 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: Secondary | ICD-10-CM | POA: Diagnosis not present

## 2023-04-02 LAB — GLUCOSE, CAPILLARY
Glucose-Capillary: 154 mg/dL — ABNORMAL HIGH (ref 70–99)
Glucose-Capillary: 171 mg/dL — ABNORMAL HIGH (ref 70–99)

## 2023-04-02 NOTE — Progress Notes (Signed)
Lawrence [name] ([id number]) [id number] Page 1 of 2 Visit Report for 03/31/2023 HBO Details Patient Name: Date of Service: [name], [name] 03/31/2023 1:30 PM Medical Record Number: [id number] Patient Account Number: [id number] Date of Birth/Sex: Treating RN: [birth year] (72 y.o. M) Primary Care Lawrence [name]: Lawrence [name] Other Clinician: Referring Lawrence [name]: Treating Lawrence [name]/Extender: Lawrence [name] in Treatment: 8 HBO Treatment Course Details Treatment Course Number: 1 Ordering Lawrence [name]: Lawrence [name] T Treatments Ordered: otal 40 HBO Treatment Start Date: 02/24/2023 HBO Indication: Other (specify in Notes) HBO Treatment Details Treatment Number: 18 Patient Type: Outpatient Chamber Type: Monoplace Chamber Serial #: [id number] Treatment Protocol: 2.0 ATA with 90 minutes oxygen, and no air breaks Treatment Details Compression Rate Down: 2.0 psi / minute De-Compression Rate Up: 1.5 psi / minute Air breaks and breathing Decompress Decompress Compress Tx Pressure Begins Reached periods Begins Ends (leave unused spaces blank) Chamber Pressure (ATA 1 2 ------2 1 ) Clock Time (24 hr) 13:44 13:54 - - - - - - 15:24 15:34 Treatment Length: 110 (minutes) Treatment Segments: 4 Vital Signs Capillary Blood Glucose Reference Range: 80 - 120 mg / dl HBO Diabetic Blood Glucose Intervention Range: <131 mg/dl or >308 mg/dl Type: Time Vitals Blood Respiratory Capillary Blood Glucose Pulse Action Pulse: Temperature: Taken: Pressure: Rate: Glucose (mg/dl): Meter #: Oximetry (%) Taken: Pre 01:40 118/80 72 18 97.9 208 98 none per protocol Post 15:34 126/80 78 18 148 1 98 none per protocol Treatment Response Treatment Toleration: Well Treatment Completion Status: Treatment Completed without Adverse Event Electronic Signature(s) Signed: 03/31/2023 3:50:35 PM By: Lawrence [name] Signed: 04/01/2023 5:03:24 PM By: Lawrence [name] Entered By: Lawrence [name] on 03/31/2023 15:49:38 -------------------------------------------------------------------------------- HBO Safety Checklist Details Patient Name: Date of Service: [name], [name] 03/31/2023 1:30 PM Medical Record Number: [id number] Patient Account Number: [id number] Date of Birth/Sex: Treating RN: 23-Apr-1951 (72 y.o. M) Primary Care Lawrence [name]: Lawrence [name] Other Clinician: Referring Lawrence [name]: Treating Lawrence [name]/Extender: Lawrence [name] in Treatment: 8 HBO Safety Checklist Items Safety Checklist Consent Form Signed Lawrence [name], Lawrence [name] ([id number]) [id number] Page 2 of 2 Patient voided / foley secured and emptied When did you last eato 03/31/23 Last dose of injectable or oral agent 03/31/23 glucogan Ostomy pouch emptied and vented if applicable NA All implantable devices assessed, documented and approved NA Intravenous access site secured and place NA Valuables secured Linens and cotton and cotton/polyester blend (less than 51% polyester) Personal oil-based products / skin lotions / body lotions removed Wigs or hairpieces removed NA Smoking or tobacco materials removed NA Books / newspapers / magazines / loose paper removed Cologne, aftershave, perfume and deodorant removed Jewelry removed (may wrap wedding band) Make-up removed NA Hair care products removed NA Battery operated devices (external) removed NA Heating patches and chemical warmers removed NA Titanium eyewear removed NA Nail polish cured greater than 10 hours NA Casting material cured greater than 10 hours NA Hearing aids removed NA Loose dentures or partials removed NA Prosthetics have been removed NA Patient demonstrates correct use of air break device (if applicable) Patient concerns have been addressed Patient grounding bracelet on and cord attached to chamber Specifics for Inpatients (complete in addition to above) Medication sheet sent with  patient Intravenous medications needed or due during therapy sent with patient Drainage tubes (e.g. nasogastric tube or chest tube secured and vented) Endotracheal or Tracheotomy tube secured Cuff deflated of air and inflated with saline Airway suctioned Electronic Signature(s) Signed: 03/31/2023 3:50:35 PM  By: Lawrence [name] ByDemetria [name] on 03/31/2023 15:46:57

## 2023-04-02 NOTE — Progress Notes (Signed)
[name] ([id number]) [id number] Page 1 of 2 Visit Report for 04/01/2023 HBO Details Patient Name: Date of Service: [name], [name] 04/01/2023 1:30 PM Medical Record Number: [id number] Patient Account Number: [id number] Date of Birth/Sex: Treating RN: [birth year] (72 y.o. M) Primary Care Elize Pinon: [name] Other Clinician: Referring Gayatri Teasdale: Treating Peg Fifer/Extender: Emilee Hero in Treatment: 8 HBO Treatment Course Details Treatment Course Number: 1 Ordering Virgin Zellers: [name] T Treatments Ordered: otal 40 HBO Treatment Start Date: 02/24/2023 HBO Indication: Other (specify in Notes) HBO Treatment Details Treatment Number: 19 Patient Type: Outpatient Chamber Type: Monoplace Chamber Serial #: [id number] Treatment Protocol: 2.0 ATA with 90 minutes oxygen, and no air breaks Treatment Details Compression Rate Down: 2.0 psi / minute De-Compression Rate Up: 1.5 psi / minute Air breaks and breathing Decompress Decompress Compress Tx Pressure Begins Reached periods Begins Ends (leave unused spaces blank) Chamber Pressure (ATA 1 2 ------2 1 ) Clock Time (24 hr) 13:47 13:57 - - - - - - 15:27 15:37 Treatment Length: 110 (minutes) Treatment Segments: 4 Vital Signs Capillary Blood Glucose Reference Range: 80 - 120 mg / dl HBO Diabetic Blood Glucose Intervention Range: <131 mg/dl or >932 mg/dl Type: Time Vitals Blood Respiratory Capillary Blood Glucose Pulse Action Pulse: Temperature: Taken: Pressure: Rate: Glucose (mg/dl): Meter #: Oximetry (%) Taken: Pre 01:30 130/80 70 19 97.9 240 1 97 none per protocol Post 15:38 124/80 64 18 98 192 1 99 none per protocol Treatment Response Treatment Toleration: Well Treatment Completion Status: Treatment Completed without Adverse Event HBO Attestation I certify that I supervised this HBO treatment in accordance with Medicare guidelines. A trained emergency response  team is readily available per Yes hospital policies and procedures. Continue HBOT as ordered. Yes Electronic Signature(s) Signed: 04/01/2023 4:40:33 PM By: Geralyn Corwin DO Previous Signature: 04/01/2023 4:00:03 PM Version By: [name] Entered By: Geralyn Corwin on 04/01/2023 16:40:10 -------------------------------------------------------------------------------- HBO Safety Checklist Details Patient Name: Date of Service: [name], [name] 04/01/2023 1:30 PM Medical Record Number: [id number] Patient Account Number: [id number] Date of Birth/Sex: Treating RN: Mar 21, 1951 (72 y.o. M) Primary Care Rennie Hack: [name] Other Clinician: Referring Trinidy Masterson: Treating Jamarii Banks/Extender: Oswaldo Milian Humptulips, [name] ([id number]) [id number] Page 2 of 2 Weeks in Treatment: 8 HBO Safety Checklist Items Safety Checklist Consent Form Signed Patient voided / foley secured and emptied When did you last eato 04/01/23 Last dose of injectable or oral agent 04/01/23 glucogan Ostomy pouch emptied and vented if applicable NA All implantable devices assessed, documented and approved NA Intravenous access site secured and place NA Valuables secured Linens and cotton and cotton/polyester blend (less than 51% polyester) Personal oil-based products / skin lotions / body lotions removed Wigs or hairpieces removed NA Smoking or tobacco materials removed NA Books / newspapers / magazines / loose paper removed Cologne, aftershave, perfume and deodorant removed Jewelry removed (may wrap wedding band) NA Make-up removed NA Hair care products removed Battery operated devices (external) removed NA Heating patches and chemical warmers removed NA Titanium eyewear removed NA Nail polish cured greater than 10 hours NA Casting material cured greater than 10 hours NA Hearing aids removed NA Loose dentures or partials removed NA Prosthetics have  been removed NA Patient demonstrates correct use of air break device (if applicable) Patient concerns have been addressed Patient grounding bracelet on and cord attached to chamber Specifics for Inpatients (complete in addition to above) Medication sheet sent with patient Intravenous medications needed or  due during therapy sent with patient Drainage tubes (e.g. nasogastric tube or chest tube secured and vented) Endotracheal or Tracheotomy tube secured Cuff deflated of air and inflated with saline Airway suctioned Electronic Signature(s) Signed: 04/01/2023 4:00:03 PM By: [name] Entered By: [name] on 04/01/2023 15:57:28

## 2023-04-03 ENCOUNTER — Encounter: Payer: Medicare Other | Admitting: Physician Assistant

## 2023-04-03 DIAGNOSIS — E10621 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: Secondary | ICD-10-CM | POA: Diagnosis not present

## 2023-04-03 LAB — GLUCOSE, CAPILLARY
Glucose-Capillary: 217 mg/dL — ABNORMAL HIGH (ref 70–99)
Glucose-Capillary: 218 mg/dL — ABNORMAL HIGH (ref 70–99)

## 2023-04-03 NOTE — Progress Notes (Signed)
Lawrence [name] ([id number]) [id number] Page 1 of 2 Visit Report for 04/02/2023 HBO Details Patient Name: Date of Service: [name], [name] 04/02/2023 1:30 PM Medical Record Number: [id number] Patient Account Number: [id number] Date of Birth/Sex: Treating RN: 1951/04/13 (72 y.o. M) Primary Care Nickia Boesen: [name] Other Clinician: Referring Calven Gilkes: Treating Ahuva Poynor/Extender: [name] in Treatment: 8 HBO Treatment Course Details Treatment Course Number: 1 Ordering Hildy Nicholl: [name] T Treatments Ordered: otal 40 HBO Treatment Start Date: 02/24/2023 HBO Indication: Other (specify in Notes) HBO Treatment Details Treatment Number: 20 Patient Type: Outpatient Chamber Type: Monoplace Chamber Serial #: [id number] Treatment Protocol: 2.0 ATA with 90 minutes oxygen, and no air breaks Treatment Details Compression Rate Down: 2.0 psi / minute De-Compression Rate Up: 1.5 psi / minute Air breaks and breathing Decompress Decompress Compress Tx Pressure Begins Reached periods Begins Ends (leave unused spaces blank) Chamber Pressure (ATA 1 2 ------2 1 ) Clock Time (24 hr) 13:46 13:56 - - - - - - 15:27 15:37 Treatment Length: 111 (minutes) Treatment Segments: 4 Vital Signs Capillary Blood Glucose Reference Range: 80 - 120 mg / dl HBO Diabetic Blood Glucose Intervention Range: <131 mg/dl or >782 mg/dl Type: Time Vitals Blood Respiratory Capillary Blood Glucose Pulse Action Pulse: Temperature: Taken: Pressure: Rate: Glucose (mg/dl): Meter #: Oximetry (%) Taken: Pre 01:45 122/80 64 18 98 154 1 99 none per protocol Post 15:38 134/80 98 18 98 171 1 none per protocol Treatment Response Treatment Toleration: Well Treatment Completion Status: Treatment Completed without Adverse Event Electronic Signature(s) Signed: 04/02/2023 4:17:20 PM By: [name] Signed: 04/02/2023 6:06:31 PM By: [name] Entered By: [name] on 04/02/2023 16:16:10 -------------------------------------------------------------------------------- HBO Safety Checklist Details Patient Name: Date of Service: [name], [name] 04/02/2023 1:30 PM Medical Record Number: [id number] Patient Account Number: [id number] Date of Birth/Sex: Treating RN: Jun 06, 1951 (72 y.o. M) Primary Care Kato Wieczorek: [name] Other Clinician: Referring [place] Dube: Treating Karrington Studnicka/Extender: [name] in Treatment: 8 HBO Safety Checklist Items Safety Checklist Consent Form Signed STELIOS, MCGHIE ([id number]) [id number] Page 2 of 2 Patient voided / foley secured and emptied When did you last eato 04/02/23 AM Last dose of injectable or oral agent 04/02/23 glucogan Ostomy pouch emptied and vented if applicable NA All implantable devices assessed, documented and approved NA Intravenous access site secured and place NA Valuables secured Linens and cotton and cotton/polyester blend (less than 51% polyester) Personal oil-based products / skin lotions / body lotions removed Wigs or hairpieces removed NA Smoking or tobacco materials removed NA Books / newspapers / magazines / loose paper removed Cologne, aftershave, perfume and deodorant removed Jewelry removed (may wrap wedding band) Make-up removed NA Hair care products removed NA Battery operated devices (external) removed NA Heating patches and chemical warmers removed NA Titanium eyewear removed NA Nail polish cured greater than 10 hours NA Casting material cured greater than 10 hours NA Hearing aids removed NA Loose dentures or partials removed NA Prosthetics have been removed NA Patient demonstrates correct use of air break device (if applicable) Patient concerns have been addressed Patient grounding bracelet on and cord attached to chamber Specifics for Inpatients (complete in addition to above) Medication sheet sent with  patient Intravenous medications needed or due during therapy sent with patient Drainage tubes (e.g. nasogastric tube or chest tube secured and vented) Endotracheal or Tracheotomy tube secured Cuff deflated of air and inflated with saline Airway suctioned Electronic Signature(s) Signed: 04/02/2023  4:17:20 PM By: [name] Entered By: [name] on 04/02/2023 15:33:01

## 2023-04-06 ENCOUNTER — Encounter: Payer: Medicare Other | Attending: Physician Assistant | Admitting: Physician Assistant

## 2023-04-06 DIAGNOSIS — N18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, stage 3a: Secondary | ICD-10-CM | POA: Insufficient documentation

## 2023-04-06 DIAGNOSIS — I120 Hypertensive chronic kidney disease with stage 5 chronic kidney disease or end stage renal disease: Secondary | ICD-10-CM | POA: Diagnosis not present

## 2023-04-06 DIAGNOSIS — E1051 Type 1 diabetes mellitus with diabetic peripheral angiopathy without gangrene: Secondary | ICD-10-CM | POA: Diagnosis not present

## 2023-04-06 DIAGNOSIS — L97512 Non-pressure chronic ulcer of other part of right foot with fat layer exposed: Secondary | ICD-10-CM | POA: Diagnosis not present

## 2023-04-06 DIAGNOSIS — E1022 Type 1 diabetes mellitus with diabetic chronic kidney disease: Secondary | ICD-10-CM | POA: Diagnosis not present

## 2023-04-06 DIAGNOSIS — I2510 Atherosclerotic heart disease of native coronary artery without angina pectoris: Secondary | ICD-10-CM | POA: Insufficient documentation

## 2023-04-06 DIAGNOSIS — L97522 Non-pressure chronic ulcer of other part of left foot with fat layer exposed: Secondary | ICD-10-CM | POA: Diagnosis not present

## 2023-04-06 DIAGNOSIS — E10621 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: Secondary | ICD-10-CM | POA: Diagnosis present

## 2023-04-06 LAB — GLUCOSE, CAPILLARY: Glucose-Capillary: 261 mg/dL — ABNORMAL HIGH (ref 70–99)

## 2023-04-06 NOTE — Progress Notes (Signed)
Lawrence [name] ([id number]) [id number] Page 1 of 2 Visit Report for 04/06/2023 HBO Details Patient Name: Date of Service: [name], [name] 04/06/2023 1:30 PM Medical Record Number: [id number] Patient Account Number: [id number] Date of Birth/Sex: Treating RN: [birth year] (72 y.o. Lawrence [name], Lawrence [name] Primary Care [name]: [name] Other Clinician: Referring Amarionna Arca: Treating Mikaiah Stoffer/Extender: [name] in Treatment: 9 HBO Treatment Course Details Treatment Course Number: 1 Ordering Tyera Hansley: [name] T Treatments Ordered: otal 40 HBO Treatment Start Date: 02/24/2023 HBO Indication: Other (specify in Notes) HBO Treatment Details Treatment Number: 22 Patient Type: Outpatient Chamber Type: Monoplace Chamber Serial #: [id number] Treatment Protocol: 2.0 ATA with 90 minutes oxygen, and no air breaks Treatment Details Compression Rate Down: 1.5 psi / minute De-Compression Rate Up: 1.5 psi / minute Air breaks and breathing Decompress Decompress Compress Tx Pressure Begins Reached periods Begins Ends (leave unused spaces blank) Chamber Pressure (ATA 1 2 ------2 1 ) Clock Time (24 hr) 13:43 13:54 - - - - - - 15:24 15:35 Treatment Length: 112 (minutes) Treatment Segments: 4 Vital Signs Capillary Blood Glucose Reference Range: 80 - 120 mg / dl HBO Diabetic Blood Glucose Intervention Range: <131 mg/dl or >782 mg/dl Time Vitals Blood Respiratory Capillary Blood Glucose Pulse Action Type: Pulse: Temperature: Taken: Pressure: Rate: Glucose (mg/dl): Meter #: Oximetry (%) Taken: Pre 13:40 132/80 68 16 98 261 Post 15:49 138/80 78 16 97.9 320 Treatment Response Treatment Toleration: Well Treatment Completion Status: Treatment Completed without Adverse Event Electronic Signature(s) Signed: 04/06/2023 4:55:56 PM By: Elliot Gurney, BSN, RN, CWS, [name] RN, BSN Signed: 04/06/2023 5:47:44 PM By: [name] Previous Signature: 04/06/2023  2:20:41 PM Version By: Elliot Gurney, BSN, RN, CWS, [name] RN, BSN Previous Signature: 04/06/2023 4:22:49 PM Version By: [name] Previous Signature: 04/06/2023 2:20:09 PM Version By: Elliot Gurney, BSN, RN, CWS, [name] RN, BSN Entered By: Elliot Gurney, BSN, RN, CWS, [name] on 04/06/2023 16:55:55 -------------------------------------------------------------------------------- HBO Safety Checklist Details Patient Name: Date of Service: [name], [name] 04/06/2023 1:30 PM Medical Record Number: [id number] Patient Account Number: [id number] Date of Birth/Sex: Treating RN: 06-Dec-1950 (72 y.o. Lawrence [name] Primary Care Irja Wheless: [name] Other Clinician: Referring Orly Quimby: Treating [name]/Extender: [name] in Treatment: 9 HBO Safety Checklist Items MICHEAUX, MATHESON ([id number]) [id number] Page 2 of 2 Safety Checklist Consent Form Signed Patient voided / foley secured and emptied When did you last eato lunch Last dose of injectable or oral agent lunch Ostomy pouch emptied and vented if applicable NA All implantable devices assessed, documented and approved NA Intravenous access site secured and place NA Valuables secured Linens and cotton and cotton/polyester blend (less than 51% polyester) Personal oil-based products / skin lotions / body lotions removed Wigs or hairpieces removed NA Smoking or tobacco materials removed NA Books / newspapers / magazines / loose paper removed NA Cologne, aftershave, perfume and deodorant removed Jewelry removed (may wrap wedding band) NA Make-up removed NA Hair care products removed Battery operated devices (external) removed NA Heating patches and chemical warmers removed NA Titanium eyewear removed Nail polish cured greater than 10 hours NA Casting material cured greater than 10 hours NA Hearing aids removed NA Loose dentures or partials removed NA Prosthetics have been removed NA Patient  demonstrates correct use of air break device (if applicable) Patient concerns have been addressed Patient grounding bracelet on and cord attached to chamber Specifics for Inpatients (complete in addition to above) Medication sheet sent with patient Intravenous  medications needed or due during therapy sent with patient Drainage tubes (e.g. nasogastric tube or chest tube secured and vented) Endotracheal or Tracheotomy tube secured Cuff deflated of air and inflated with saline Airway suctioned Electronic Signature(s) Signed: 04/06/2023 2:19:43 PM By: Elliot Gurney, BSN, RN, CWS, [name] RN, BSN Entered By: Elliot Gurney, BSN, RN, CWS, [name] on 04/06/2023 14:19:42

## 2023-04-07 ENCOUNTER — Encounter: Payer: Medicare Other | Admitting: Physician Assistant

## 2023-04-07 DIAGNOSIS — E10621 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: Secondary | ICD-10-CM | POA: Diagnosis not present

## 2023-04-07 LAB — GLUCOSE, CAPILLARY
Glucose-Capillary: 320 mg/dL — ABNORMAL HIGH (ref 70–99)
Glucose-Capillary: 155 mg/dL — ABNORMAL HIGH (ref 70–99)
Glucose-Capillary: 84 mg/dL (ref 70–99)

## 2023-04-07 NOTE — Progress Notes (Signed)
Lawrence [name] ([id number]) [id number] Page 1 of 2 Visit Report for 04/07/2023 HBO Details Patient Name: Date of Service: [name], [name] 04/07/2023 1:30 PM Medical Record Number: [id number] Patient Account Number: [id number] Date of Birth/Sex: Treating RN: 07-Feb-1951 (72 y.o. Lawrence [name], Lawrence [name] Primary Care Chimamanda Siegfried: [name] Other Clinician: Referring Elverna Caffee: Treating Marcas Bowsher/Extender: [name] in Treatment: 9 HBO Treatment Course Details Treatment Course Number: 1 Ordering Bode Pieper: [name] T Treatments Ordered: otal 40 HBO Treatment Start Date: 02/24/2023 HBO Indication: Other (specify in Notes) HBO Treatment Details Treatment Number: 23 Patient Type: Outpatient Chamber Type: Monoplace Chamber Serial #: [id number] Treatment Protocol: 2.0 ATA with 90 minutes oxygen, and no air breaks Treatment Details Compression Rate Down: 1.5 psi / minute De-Compression Rate Up: 1.5 psi / minute Air breaks and breathing Decompress Decompress Compress Tx Pressure Begins Reached periods Begins Ends (leave unused spaces blank) Chamber Pressure (ATA 1 2 ------2 1 ) Clock Time (24 hr) 13:48 13:59 - - - - - - 15:30 15:42 Treatment Length: 114 (minutes) Treatment Segments: 4 Vital Signs Capillary Blood Glucose Reference Range: 80 - 120 mg / dl HBO Diabetic Blood Glucose Intervention Range: <131 mg/dl or >782 mg/dl Type: Time Vitals Blood Pulse: Respiratory Capillary Blood Glucose Pulse Action Temperature: Taken: Pressure: Rate: Glucose (mg/dl): Meter #: Oximetry (%) Taken: Pre 13:20 140/78 68 16 97.8 155 treatment initiated Post 15:56 140/78 68 16 98.1 84 PA notified, dc home Treatment Response Treatment Toleration: Well Treatment Completion Status: Treatment Completed without Adverse Event Taran Haynesworth Notes Patient BG is 84 after treatment. Patient states he is fine, he does not feel bad until it gets into the 71s. He is  heading home and will eat as soon as he gets there. Patient declined ENsure, juice, crackers and peanut butter. Electronic Signature(s) Signed: 04/08/2023 9:42:00 AM By: Elliot Gurney, BSN, RN, CWS, [name] RN, BSN Signed: 04/08/2023 6:23:44 PM By: [name] Previous Signature: 04/07/2023 3:32:58 PM Version By: Elliot Gurney BSN, RN, CWS, [name] RN, BSN Previous Signature: 04/07/2023 5:37:24 PM Version By: [name] Previous Signature: 04/07/2023 2:09:38 PM Version By: Elliot Gurney, BSN, RN, CWS, [name] RN, BSN Entered By: Elliot Gurney, BSN, RN, CWS, [name] on 04/08/2023 09:42:00 -------------------------------------------------------------------------------- HBO Safety Checklist Details Patient Name: Date of Service: [name], [name] 04/07/2023 1:30 PM Medical Record Number: [id number] Patient Account Number: [id number] Date of Birth/Sex: Treating RN: [birth year] (72 y.o. Lawrence [name] Primary Care Munachimso Palin: [name] Other Clinician: Referring Markee Remlinger: Treating Kaiven Vester/Extender: Skip Mayer Wilton, [name] ([id number]) [id number] Page 2 of 2 Weeks in Treatment: 9 HBO Safety Checklist Items Safety Checklist Consent Form Signed Patient voided / foley secured and emptied When did you last eato lunch Last dose of injectable or oral agent lunch Ostomy pouch emptied and vented if applicable NA All implantable devices assessed, documented and approved NA Intravenous access site secured and place NA Valuables secured Linens and cotton and cotton/polyester blend (less than 51% polyester) Personal oil-based products / skin lotions / body lotions removed Wigs or hairpieces removed NA Smoking or tobacco materials removed NA Books / newspapers / magazines / loose paper removed NA Cologne, aftershave, perfume and deodorant removed NA Jewelry removed (may wrap wedding band) NA Make-up removed Hair care products removed Battery operated devices (external)  removed NA Heating patches and chemical warmers removed NA Titanium eyewear removed NA Nail polish cured greater than 10 hours NA Casting material cured greater than 10 hours NA Hearing  aids removed NA Loose dentures or partials removed NA Prosthetics have been removed NA Patient demonstrates correct use of air break device (if applicable) Patient concerns have been addressed Patient grounding bracelet on and cord attached to chamber Specifics for Inpatients (complete in addition to above) Medication sheet sent with patient Intravenous medications needed or due during therapy sent with patient Drainage tubes (e.g. nasogastric tube or chest tube secured and vented) Endotracheal or Tracheotomy tube secured Cuff deflated of air and inflated with saline Airway suctioned Electronic Signature(s) Signed: 04/07/2023 2:08:39 PM By: Elliot Gurney, BSN, RN, CWS, [name] RN, BSN Entered By: Elliot Gurney, BSN, RN, CWS, [name] on 04/07/2023 14:08:39

## 2023-04-08 ENCOUNTER — Encounter: Payer: Medicare Other | Admitting: Internal Medicine

## 2023-04-08 DIAGNOSIS — E10621 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: Secondary | ICD-10-CM | POA: Diagnosis not present

## 2023-04-08 LAB — GLUCOSE, CAPILLARY
Glucose-Capillary: 168 mg/dL — ABNORMAL HIGH (ref 70–99)
Glucose-Capillary: 129 mg/dL — ABNORMAL HIGH (ref 70–99)

## 2023-04-08 NOTE — Progress Notes (Signed)
Lawrence [name] ([id number]) [id number] Page 1 of 2 Visit Report for 04/08/2023 HBO Details Patient Name: Date of Service: Lawrence [name], [name] 04/08/2023 10:30 Lawrence M Medical Record Number: [id number] Patient Account Number: [id number] Date of Birth/Sex: Treating RN: 09/24/1951 (72 y.o. Lawrence [name], Lawrence [name] Primary Care Lawrence [name]: [name] Other Clinician: Referring Lawrence [name] HBO Treatment Course Details Treatment Course Number: 1 Ordering Ebbie Cherry: [name] T Treatments Ordered: otal 40 HBO Treatment Start Date: 02/24/2023 HBO Indication: Other (specify in Notes) HBO Treatment Details Treatment Number: 24 Patient Type: Outpatient Chamber Type: Monoplace Chamber Serial #: [id number] Treatment Protocol: 2.0 ATA with 90 minutes oxygen, and no air breaks Treatment Details Compression Rate Down: 1.5 psi / minute De-Compression Rate Up: 1.5 psi / minute Air breaks and breathing Decompress Decompress Compress Tx Pressure Begins Reached periods Begins Ends (leave unused spaces blank) Chamber Pressure (ATA 1 2 ------2 1 ) Clock Time (24 hr) 10:44 10:55 - - - - - - 12:25 12:35 Treatment Length: 111 (minutes) Treatment Segments: 4 Vital Signs Capillary Blood Glucose Reference Range: 80 - 120 mg / dl HBO Diabetic Blood Glucose Intervention Range: <131 mg/dl or >846 mg/dl Type: Time Vitals Blood Respiratory Capillary Blood Glucose Pulse Action Pulse: Temperature: Taken: Pressure: Rate: Glucose (mg/dl): Meter #: Oximetry (%) Taken: Pre 10:30 142/68 62 16 97.5 168 treatment initiated Post 12:39 140/68 70 16 97.6 129 discharged to home Treatment Response Treatment Toleration: Well Treatment Completion Status: Treatment Completed without Adverse Event Lawrence [name] Notes No concerns with treatment given HBO Attestation I certify that I supervised this HBO treatment in  accordance with Medicare guidelines. Lawrence trained emergency response team is readily available per Yes hospital policies and procedures. Continue HBOT as ordered. Yes Electronic Signature(s) Signed: 04/08/2023 5:06:19 PM By: Baltazar Najjar MD Previous Signature: 04/08/2023 1:30:50 PM Version By: Elliot Gurney BSN, RN, CWS, [name] RN, BSN Previous Signature: 04/08/2023 11:07:01 AM Version By: Elliot Gurney, BSN, RN, CWS, [name] RN, BSN Entered By: Baltazar Najjar on 04/08/2023 15:11:01 -------------------------------------------------------------------------------- HBO Safety Checklist Details Patient Name: Date of Service: Lawrence Householder MS, [name] 04/08/2023 10:30 Lawrence Lawrence [name] ([id number]) [id number] Page 2 of 2 Medical Record Number: [id number] Patient Account Number: [id number] Date of Birth/Sex: Treating RN: Nov 23, 1950 (72 y.o. Lawrence [name], Lawrence [name] Primary Care Lawrence [name]: [name] Other Clinician: Referring Lawrence [name]: Treating Lawrence [name]/Extender: RO BSO N, MICHA EL Albertine Grates, Dorene Grebe in Treatment: [name] HBO Safety Checklist Items Safety Checklist Consent Form Signed Patient voided / foley secured and emptied When did you last eato am Last dose of injectable or oral agent am Ostomy pouch emptied and vented if applicable NA All implantable devices assessed, documented and approved NA Intravenous access site secured and place NA Valuables secured Linens and cotton and cotton/polyester blend (less than 51% polyester) Personal oil-based products / skin lotions / body lotions removed Wigs or hairpieces removed NA Smoking or tobacco materials removed Books / newspapers / magazines / loose paper removed Cologne, aftershave, perfume and deodorant removed Jewelry removed (may wrap wedding band) Make-up removed NA Hair care products removed NA Battery operated devices (external) removed Heating patches and chemical warmers removed Titanium eyewear removed Nail polish cured greater  than 10 hours NA Casting material cured greater than 10 hours NA Hearing aids removed NA Loose dentures or partials removed NA Prosthetics have been removed NA Patient demonstrates correct use of air break device (if applicable) Patient  concerns have been addressed Patient grounding bracelet on and cord attached to chamber Specifics for Inpatients (complete in addition to above) Medication sheet sent with patient Intravenous medications needed or due during therapy sent with patient Drainage tubes (e.g. nasogastric tube or chest tube secured and vented) Endotracheal or Tracheotomy tube secured Cuff deflated of air and inflated with saline Airway suctioned Electronic Signature(s) Signed: 04/08/2023 11:06:03 AM By: Elliot Gurney, BSN, RN, CWS, [name] RN, BSN Entered By: Elliot Gurney, BSN, RN, CWS, [name] on 04/08/2023 11:06:03

## 2023-04-09 ENCOUNTER — Encounter: Payer: Medicare Other | Admitting: Physician Assistant

## 2023-04-09 DIAGNOSIS — E10621 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: Secondary | ICD-10-CM | POA: Diagnosis not present

## 2023-04-09 LAB — GLUCOSE, CAPILLARY
Glucose-Capillary: 105 mg/dL — ABNORMAL HIGH (ref 70–99)
Glucose-Capillary: 106 mg/dL — ABNORMAL HIGH (ref 70–99)

## 2023-04-10 ENCOUNTER — Encounter: Payer: Medicare Other | Admitting: Physician Assistant

## 2023-04-10 DIAGNOSIS — E10621 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: Secondary | ICD-10-CM | POA: Diagnosis not present

## 2023-04-10 LAB — GLUCOSE, CAPILLARY
Glucose-Capillary: 211 mg/dL — ABNORMAL HIGH (ref 70–99)
Glucose-Capillary: 164 mg/dL — ABNORMAL HIGH (ref 70–99)

## 2023-04-10 NOTE — Progress Notes (Signed)
EULAN, HEYWARD ([id number]) [id number] Page 1 of 1 Visit Report for 04/09/2023 Physician Orders Details Patient Name: Date of Service: [name], [name] 04/09/2023 1:30 PM Medical Record Number: [id number] Patient Account Number: [id number] Date of Birth/Sex: Treating RN: [birth year] (72 y.o. [name] Primary Care Provider: [name] Other Clinician: Referring Provider: Treating Provider/Extender: [name] in Treatment: 9 Verbal / Phone Orders: No Diagnosis Coding Hyperbaric Oxygen Therapy Other - No treatment today: Patient came in today for HBOT Blood sugar was 106 @13 :36. Patient must be at 130 to go into the HBOT chamber. . Policy is to give patient Ensure Protein and recheck in 30 minutes. Patient refused Ensure Protein. He had 2 tubes of glucose gel in his locker that he used. PA came in and spoke to the patient explaining that glucose gel would spike his blood sugar, but would drop fast as well. The patient then ate graham crackers with peanut butter. The patient's blood glucose was checked again at 14:06 and it had dropped to 105. Patient will not dive today. He will come back in the morning and try again. Wound Treatment Electronic Signature(s) Signed: 04/10/2023 10:52:32 AM By: Elliot Gurney, BSN, RN, CWS, [name] RN, BSN Signed: 04/10/2023 12:57:26 PM By: [name] Entered By: Elliot Gurney BSN, RN, CWS, [name] on 04/10/2023 10:52:31 -------------------------------------------------------------------------------- SuperBill Details Patient Name: Date of Service: [name] MS, [name] 04/09/2023 Medical Record Number: [id number] Patient Account Number: [id number] Date of Birth/Sex: Treating RN: 1951/05/09 (72 y.o. 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

## 2023-04-10 NOTE — Progress Notes (Addendum)
Lawrence [name] ([id number]) [id number] Page 1 of 2 Visit Report for 04/10/2023 HBO Details Patient Name: Date of Service: [name], [name] 04/10/2023 9:45 A M Medical Record Number: [id number] Patient Account Number: [id number] Date of Birth/Sex: Treating RN: 23-Apr-1951 (72 y.o. Lawrence [name], Lawrence [name] Primary Care Sharrie Self: [name] Other Clinician: Referring Dalaya Suppa: Treating Sergio Zawislak/Extender: [name] in Treatment: 10 HBO Treatment Course Details Treatment Course Number: 1 Ordering Annya Lizana: [name] T Treatments Ordered: otal 40 HBO Treatment Start Date: 02/24/2023 HBO Indication: Other (specify in Notes) HBO Treatment Details Treatment Number: 25 Patient Type: Outpatient Chamber Type: Monoplace Chamber Serial #: [id number] Treatment Protocol: 2.0 ATA with 90 minutes oxygen, and no air breaks Treatment Details Compression Rate Down: 1.5 psi / minute De-Compression Rate Up: 1.5 psi / minute Air breaks and breathing Decompress Decompress Compress Tx Pressure Begins Reached periods Begins Ends (leave unused spaces blank) Chamber Pressure (ATA 1 2 ------2 1 ) Clock Time (24 hr) 10:18 10:30 - - - - - - 12:13 12:26 Treatment Length: 128 (minutes) Treatment Segments: 4 Vital Signs Capillary Blood Glucose Reference Range: 80 - 120 mg / dl HBO Diabetic Blood Glucose Intervention Range: <131 mg/dl or >086 mg/dl Type: Time Vitals Blood Respiratory Capillary Blood Glucose Pulse Action Pulse: Temperature: Taken: Pressure: Rate: Glucose (mg/dl): Meter #: Oximetry (%) Taken: Pre 10:05 128/60 64 18 97.5 211 treatment initiated Post 12:25 147/76 68 16 97.9 164 discharged home Treatment Response Treatment Toleration: Well Treatment Completion Status: Treatment Completed without Adverse Event HBO Attestation I certify that I supervised this HBO treatment in accordance with Medicare guidelines. A trained emergency response  team is readily available per Yes hospital policies and procedures. Continue HBOT as ordered. Yes Electronic Signature(s) Signed: 04/10/2023 12:43:26 PM By: Elliot Gurney, BSN, RN, CWS, [name] RN, BSN Signed: 04/10/2023 12:57:26 PM By: [name] Previous Signature: 04/10/2023 10:34:27 AM Version By: Elliot Gurney, BSN, RN, CWS, [name] RN, BSN Entered By: Elliot Gurney, BSN, RN, CWS, [name] on 04/10/2023 12:43:26 -------------------------------------------------------------------------------- HBO Safety Checklist Details Patient Name: Date of Service: [name], [name] 04/10/2023 9:45 A M Medical Record Number: [id number] Patient Account Number: [id number] Date of Birth/Sex: Treating RN: 1951/08/16 (72 y.o. Lawrence [name] Primary Care Marijo Quizon: [name] Other Clinician: Herma [name] ([id number]) [id number] Page 2 of 2 Referring Hamp Moreland: Treating Keghan Mcfarren/Extender: [name] in Treatment: 10 HBO Safety Checklist Items Safety Checklist Consent Form Signed Patient voided / foley secured and emptied When did you last eato am Last dose of injectable or oral agent am Ostomy pouch emptied and vented if applicable NA All implantable devices assessed, documented and approved NA Intravenous access site secured and place NA Valuables secured Linens and cotton and cotton/polyester blend (less than 51% polyester) Personal oil-based products / skin lotions / body lotions removed Wigs or hairpieces removed NA Smoking or tobacco materials removed NA Books / newspapers / magazines / loose paper removed Cologne, aftershave, perfume and deodorant removed Jewelry removed (may wrap wedding band) NA Make-up removed NA Hair care products removed Battery operated devices (external) removed NA Heating patches and chemical warmers removed NA Titanium eyewear removed NA Nail polish cured greater than 10 hours NA Casting material cured greater than 10  hours NA Hearing aids removed NA Loose dentures or partials removed NA Prosthetics have been removed NA Patient demonstrates correct use of air break device (if applicable) Patient concerns have been addressed Patient grounding bracelet on and cord attached  to chamber Specifics for Inpatients (complete in addition to above) Medication sheet sent with patient Intravenous medications needed or due during therapy sent with patient Drainage tubes (e.g. nasogastric tube or chest tube secured and vented) Endotracheal or Tracheotomy tube secured Cuff deflated of air and inflated with saline Airway suctioned Electronic Signature(s) Signed: 04/10/2023 10:33:47 AM By: Elliot Gurney, BSN, RN, CWS, [name] RN, BSN Previous Signature: 04/10/2023 10:33:39 AM Version By: Elliot Gurney, BSN, RN, CWS, [name] RN, BSN Entered By: Elliot Gurney, BSN, RN, CWS, [name] on 04/10/2023 10:33:47

## 2023-04-10 NOTE — Progress Notes (Signed)
Lawrence [name], Lawrence [name] ([id number]) [id number] Page 1 of 3 Visit Report for 04/09/2023 Arrival Information Details Patient Name: Date of Service: [name], [name] 04/09/2023 1:30 PM Medical Record Number: [id number] Patient Account Number: [id number] Date of Birth/Sex: Treating RN: Jun 13, 1951 (72 y.o. Lawrence [name] Lawrence [name]: Lawrence [name] Other Clinician: Referring Lawrence [name]: Treating Lawrence [name]/Extender: Lawrence [name] in Treatment: 9 Visit Information History Since Last Visit Pain Present Now: No Patient Arrived: Cane Arrival Time: 13:30 Transfer Assistance: None Patient Requires Transmission-Based Precautions: No Patient Has Alerts: Yes Electronic Signature(s) Signed: 04/10/2023 10:44:28 AM By: Elliot Gurney, BSN, RN, CWS, [name] RN, BSN Entered By: Elliot Gurney, BSN, RN, CWS, [name] on 04/10/2023 10:44:28 -------------------------------------------------------------------------------- Clinic Level of [name] Assessment Details Patient Name: Date of Service: [name], [name] 04/09/2023 1:30 PM Medical Record Number: [id number] Patient Account Number: [id number] Date of Birth/Sex: Treating RN: 1951-10-20 (72 y.o. Lawrence [name], Lawrence [name] Primary [name] Lynden Carrithers: Lawrence [name] Other Clinician: Referring Jeshawn Melucci: Treating Jenan Ellegood/Extender: Lawrence [name] in Treatment: 9 Clinic Level of [name] Assessment Items TOOL 4 Quantity Score []  - 0 Use when only an EandM is performed on FOLLOW-UP visit ASSESSMENTS - Nursing Assessment / Reassessment []  - 0 Reassessment of Co-morbidities (includes updates in patient status) []  - 0 Reassessment of Adherence to Treatment Plan ASSESSMENTS - Wound and Skin A ssessment / Reassessment []  - 0 Simple Wound Assessment / Reassessment - one wound []  - 0 Complex Wound Assessment / Reassessment - multiple wounds []  - 0 Dermatologic / Skin Assessment (not related to wound area) ASSESSMENTS - Focused  Assessment []  - 0 Circumferential Edema Measurements - multi extremities []  - 0 Nutritional Assessment / Counseling / Intervention []  - 0 Lower Extremity Assessment (monofilament, tuning fork, pulses) []  - 0 Peripheral Arterial Disease Assessment (using hand held doppler) ASSESSMENTS - Ostomy and/or Continence Assessment and [name] []  - 0 Incontinence Assessment and Management []  - 0 Ostomy [name] Assessment and Management (repouching, etc.) PROCESS - Coordination of [name] X - Simple Patient / Family Education for ongoing [name] 1 15 []  - 0 Complex (extensive) Patient / Family Education for ongoing [name] []  - 0 Staff obtains Chiropractor, Records, T Results / Process Orders est []  - 0 Staff telephones HHA, Nursing Homes / Clarify orders / etc Lawrence [name], Lawrence [name] ([id number]) [id number] Page 2 of 3 []  - 0 Routine Transfer to another Facility (non-emergent condition) []  - 0 Routine Hospital Admission (non-emergent condition) []  - 0 New Admissions / Manufacturing engineer / Ordering NPWT Apligraf, etc. , []  - 0 Emergency Hospital Admission (emergent condition) X- 1 10 Simple Discharge Coordination []  - 0 Complex (extensive) Discharge Coordination PROCESS - Special Needs []  - 0 Pediatric / Minor Patient Management []  - 0 Isolation Patient Management []  - 0 Hearing / Language / Visual special needs []  - 0 Assessment of Community assistance (transportation, D/C planning, etc.) []  - 0 Additional assistance / Altered mentation []  - 0 Support Surface(s) Assessment (bed, cushion, seat, etc.) INTERVENTIONS - Wound Cleansing / Measurement []  - 0 Simple Wound Cleansing - one wound []  - 0 Complex Wound Cleansing - multiple wounds []  - 0 Wound Imaging (photographs - any number of wounds) []  - 0 Wound Tracing (instead of photographs) []  - 0 Simple Wound Measurement - one wound []  - 0 Complex Wound Measurement - multiple wounds INTERVENTIONS - Wound  Dressings []  - 0 Small Wound Dressing one or multiple wounds []  - 0 Medium Wound Dressing one or  multiple wounds []  - 0 Large Wound Dressing one or multiple wounds []  - 0 Application of Medications - topical []  - 0 Application of Medications - injection INTERVENTIONS - Miscellaneous []  - 0 External ear exam []  - 0 Specimen Collection (cultures, biopsies, blood, body fluids, etc.) []  - 0 Specimen(s) / Culture(s) sent or taken to Lab for analysis []  - 0 Patient Transfer (multiple staff / Nurse, adult / Similar devices) []  - 0 Simple Staple / Suture removal (25 or less) []  - 0 Complex Staple / Suture removal (26 or more) X- 1 10 Hypo / Hyperglycemic Management (close monitor of Blood Glucose) []  - 0 Ankle / Brachial Index (ABI) - do not check if billed separately []  - 0 Vital Signs Has the patient been seen at the hospital within the last three years: Yes Total Score: 35 Level Of [name]: New/Established - Level 1 Electronic Signature(s) Unsigned Entered By: Elliot Gurney, BSN, RN, CWS, [name] on 04/10/2023 10:53:16 Lawrence [name] ([id number]) [id number] Page 3 of 3 -------------------------------------------------------------------------------- Vitals Details Patient Name: Date of Service: [name], [name] 04/09/2023 1:30 PM Medical Record Number: [id number] Patient Account Number: [id number] Date of Birth/Sex: Treating RN: 1951/07/10 (72 y.o. Lawrence [name], Lawrence [name] Primary [name] Keilly Fatula: Lawrence [name] Other Clinician: Referring Baily Hovanec: Treating Kaili Castille/Extender: Lawrence [name] in Treatment: 9 Vital Signs Time Taken: 13:36 Capillary Blood Glucose (mg/dl): 865 Height (in): 70 Reference Range: 80 - 120 mg / dl Weight (lbs): 784 Body Mass Index (BMI): 43.3 Notes Patient came in today for HBOT Blood sugar was 106 @13 :36. Patient must be at 130 to go into the HBOT chamber. Policy is to give patient Ensure Protein . and recheck in 30  minutes. Patient refused Ensure Protein. He had 2 tubes of glucose gel in his locker that he used. PA came in and spoke to the patient explaining that glucose gel would spike his blood sugar, but would drop fast as well. The patient then ate graham crackers with peanut butter. The patient's blood glucose was checked again at 14:06 and it had dropped to 105. Patient will not dive today. He will come back in the morning and try again. Electronic Signature(s) Signed: 04/10/2023 10:51:22 AM By: Elliot Gurney, BSN, RN, CWS, [name] RN, BSN Entered By: Elliot Gurney, BSN, RN, CWS, [name] on 04/10/2023 10:51:21

## 2023-04-13 ENCOUNTER — Encounter: Payer: Medicare Other | Admitting: Physician Assistant

## 2023-04-14 ENCOUNTER — Encounter: Payer: Medicare Other | Admitting: Physician Assistant

## 2023-04-14 DIAGNOSIS — E10621 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: Secondary | ICD-10-CM | POA: Diagnosis not present

## 2023-04-14 LAB — GLUCOSE, CAPILLARY
Glucose-Capillary: 238 mg/dL — ABNORMAL HIGH (ref 70–99)
Glucose-Capillary: 191 mg/dL — ABNORMAL HIGH (ref 70–99)

## 2023-04-15 ENCOUNTER — Encounter (HOSPITAL_BASED_OUTPATIENT_CLINIC_OR_DEPARTMENT_OTHER): Payer: Medicare Other | Admitting: Internal Medicine

## 2023-04-15 DIAGNOSIS — L97522 Non-pressure chronic ulcer of other part of left foot with fat layer exposed: Secondary | ICD-10-CM

## 2023-04-15 DIAGNOSIS — M86371 Chronic multifocal osteomyelitis, right ankle and foot: Secondary | ICD-10-CM

## 2023-04-15 DIAGNOSIS — L97512 Non-pressure chronic ulcer of other part of right foot with fat layer exposed: Secondary | ICD-10-CM

## 2023-04-15 DIAGNOSIS — E10621 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: Secondary | ICD-10-CM | POA: Diagnosis not present

## 2023-04-15 LAB — GLUCOSE, CAPILLARY
Glucose-Capillary: 162 mg/dL — ABNORMAL HIGH (ref 70–99)
Glucose-Capillary: 87 mg/dL (ref 70–99)

## 2023-04-15 NOTE — Progress Notes (Signed)
DURON, MEISTER ([id number]) [id number] Page 1 of 2 Visit Report for 04/14/2023 Arrival Information Details Patient Name: Date of Service: [name], [name] 04/14/2023 1:30 PM Medical Record Number: [id number] Patient Account Number: [id number] Date of Birth/Sex: Treating RN: [birth year] (72 y.o. M) Primary Care Tinamarie Przybylski: [name] Other Clinician: Referring Nathaly Dawkins: Treating Cowen Pesqueira/Extender: [name] in Treatment: 10 Visit Information History Since Last Visit Added or deleted any medications: No Patient Arrived: Ambulatory Any new allergies or adverse reactions: No Arrival Time: 01:30 Had a fall or experienced change in No Accompanied By: wife activities of daily living that may affect Transfer Assistance: None risk of falls: Patient Identification Verified: Yes Signs or symptoms of abuse/neglect since last visito No Secondary Verification Process Completed: Yes Hospitalized since last visit: No Patient Requires Transmission-Based Precautions: No Implantable device outside of the clinic excluding No Patient Has Alerts: Yes cellular tissue based products placed in the center since last visit: Pain Present Now: No Electronic Signature(s) Signed: 04/14/2023 3:55:19 PM By: [name] Entered By: [name] on 04/14/2023 15:45:56 -------------------------------------------------------------------------------- Encounter Discharge Information Details Patient Name: Date of Service: [name], [name] 04/14/2023 1:30 PM Medical Record Number: [id number] Patient Account Number: [id number] Date of Birth/Sex: Treating RN: 01/20/1951 (72 y.o. M) Primary Care Aarohi Redditt: [name] Other Clinician: Referring Mehul Rudin: Treating Tyliah Schlereth/Extender: [name] in Treatment: 10 Encounter Discharge Information Items Discharge Condition: Stable Ambulatory Status: Cane Discharge Destination:  Home Transportation: Private Auto Accompanied By: self Schedule Follow-up Appointment: Yes Clinical Summary of Care: Electronic Signature(s) Signed: 04/14/2023 3:55:19 PM By: [name] Entered By: [name] on 04/14/2023 15:50:51 -------------------------------------------------------------------------------- Vitals Details Patient Name: Date of Service: [name], [name] 04/14/2023 1:30 PM Medical Record Number: [id number] Patient Account Number: [id number] Date of Birth/Sex: Treating RN: 1951-05-25 (72 y.o. M) Primary Care Enisa Runyan: [name] Other Clinician: Referring Siri Buege: Treating Julian Askin/Extender: [name] in Treatment: 10 Vital Signs Time Taken: 01:30 Temperature (F): 97.9 Height (in): 70 Pulse (bpm): [address] ([id number]) [id number] Page 2 of 2 Weight (lbs): 302 Respiratory Rate (breaths/min): 18 Body Mass Index (BMI): 43.3 Blood Pressure (mmHg): 124/78 Capillary Blood Glucose (mg/dl): 188 Reference Range: 80 - 120 mg / dl Airway Pulse Oximetry (%): 97 Electronic Signature(s) Signed: 04/14/2023 3:55:19 PM By: [name] Entered By: [name] on 04/14/2023 15:45:59

## 2023-04-15 NOTE — Progress Notes (Signed)
[name] ([id number]) [id number] Page 1 of 2 Visit Report for 04/14/2023 HBO Details Patient Name: Date of Service: [name], [name] 04/14/2023 1:30 PM Medical Record Number: [id number] Patient Account Number: [id number] Date of Birth/Sex: Treating RN: 1950/12/16 (72 y.o. M) Primary Care Jaedon Siler: [name] Other Clinician: Referring Marquarius Lofton: Treating Quiara Killian/Extender: [name] in Treatment: 10 HBO Treatment Course Details Treatment Course Number: 1 Ordering Tulsi Crossett: [name] T Treatments Ordered: otal 40 HBO Treatment Start Date: 02/24/2023 HBO Indication: Other (specify in Notes) HBO Treatment Details Treatment Number: 26 Patient Type: Outpatient Chamber Type: Monoplace Chamber Serial #: [id number] Treatment Protocol: 2.0 ATA with 90 minutes oxygen, and no air breaks Treatment Details Compression Rate Down: 2.0 psi / minute De-Compression Rate Up: 1.5 psi / minute Air breaks and breathing Decompress Decompress Compress Tx Pressure Begins Reached periods Begins Ends (leave unused spaces blank) Chamber Pressure (ATA 1 2 ------2 1 ) Clock Time (24 hr) 13:40 13:51 - - - - - - 15:21 15:31 Treatment Length: 111 (minutes) Treatment Segments: 4 Vital Signs Capillary Blood Glucose Reference Range: 80 - 120 mg / dl HBO Diabetic Blood Glucose Intervention Range: <131 mg/dl or >295 mg/dl Time Vitals Blood Respiratory Capillary Blood Glucose Pulse Action Type: Pulse: Temperature: Taken: Pressure: Rate: Glucose (mg/dl): Meter #: Oximetry (%) Taken: Pre 01:30 124/78 78 18 97.9 238 97 Treatment Response Treatment Toleration: Well Treatment Completion Status: Treatment Completed without Adverse Event Electronic Signature(s) Signed: 04/14/2023 3:55:19 PM By: [name] Signed: 04/14/2023 6:26:43 PM By: [name] Entered By: [name] on 04/14/2023  15:48:02 -------------------------------------------------------------------------------- HBO Safety Checklist Details Patient Name: Date of Service: [name], [name] 04/14/2023 1:30 PM Medical Record Number: [id number] Patient Account Number: [id number] Date of Birth/Sex: Treating RN: Apr 17, 1951 (72 y.o. M) Primary Care Christia Domke: [name] Other Clinician: Referring Flonnie Wierman: Treating Carin Shipp/Extender: [name] in Treatment: 10 HBO Safety Checklist Items Safety Checklist Consent Form Signed Patient voided / foley secured and emptied EARL, LOSEE ([id number]) [id number] Page 2 of 2 When did you last eato 04/14/23 Last dose of injectable or oral agent 04/14/23 gulcogan Ostomy pouch emptied and vented if applicable NA All implantable devices assessed, documented and approved NA Intravenous access site secured and place NA Valuables secured Linens and cotton and cotton/polyester blend (less than 51% polyester) Personal oil-based products / skin lotions / body lotions removed Wigs or hairpieces removed NA Smoking or tobacco materials removed NA Books / newspapers / magazines / loose paper removed Cologne, aftershave, perfume and deodorant removed Jewelry removed (may wrap wedding band) Make-up removed NA Hair care products removed Battery operated devices (external) removed NA Heating patches and chemical warmers removed NA Titanium eyewear removed NA Nail polish cured greater than 10 hours NA Casting material cured greater than 10 hours NA Hearing aids removed NA Loose dentures or partials removed NA Prosthetics have been removed NA Patient demonstrates correct use of air break device (if applicable) Patient concerns have been addressed Patient grounding bracelet on and cord attached to chamber Specifics for Inpatients (complete in addition to above) Medication sheet sent with patient Intravenous  medications needed or due during therapy sent with patient Drainage tubes (e.g. nasogastric tube or chest tube secured and vented) Endotracheal or Tracheotomy tube secured Cuff deflated of air and inflated with saline Airway suctioned Electronic Signature(s) Signed: 04/14/2023 3:55:19 PM By: [name] Entered By: [name] on 04/14/2023 15:46:03

## 2023-04-16 ENCOUNTER — Encounter: Payer: Medicare Other | Admitting: Physician Assistant

## 2023-04-16 DIAGNOSIS — E10621 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: Secondary | ICD-10-CM | POA: Diagnosis not present

## 2023-04-16 LAB — GLUCOSE, CAPILLARY
Glucose-Capillary: 165 mg/dL — ABNORMAL HIGH (ref 70–99)
Glucose-Capillary: 145 mg/dL — ABNORMAL HIGH (ref 70–99)

## 2023-04-16 NOTE — Progress Notes (Signed)
LATOYA, DISKIN ([id number]) [id number] Page 1 of 2 Visit Report for 04/15/2023 Arrival Information Details Patient Name: Date of Service: [name], [name] 04/15/2023 1:30 PM Medical Record Number: [id number] Patient Account Number: [id number] Date of Birth/Sex: Treating RN: 01-Aug-1951 (72 y.o. M) Primary Care Kimmie Berggren: [name] Other Clinician: Referring Kamyia Thomason: Treating Rosemaria Inabinet/Extender: Emilee Hero in Treatment: 10 Visit Information History Since Last Visit Added or deleted any medications: No Patient Arrived: Cane Any new allergies or adverse reactions: No Arrival Time: 01:20 Had a fall or experienced change in No Accompanied By: self activities of daily living that may affect Transfer Assistance: None risk of falls: Patient Identification Verified: Yes Signs or symptoms of abuse/neglect since last visito No Secondary Verification Process Completed: Yes Hospitalized since last visit: No Patient Requires Transmission-Based Precautions: No Implantable device outside of the clinic excluding No Patient Has Alerts: Yes cellular tissue based products placed in the center since last visit: Pain Present Now: No Electronic Signature(s) Signed: 04/15/2023 3:55:23 PM By: [name] Entered By: [name] on 04/15/2023 15:50:31 -------------------------------------------------------------------------------- Encounter Discharge Information Details Patient Name: Date of Service: [name], [name] 04/15/2023 1:30 PM Medical Record Number: [id number] Patient Account Number: [id number] Date of Birth/Sex: Treating RN: [birth year] (72 y.o. M) Primary Care Kateri Balch: [name] Other Clinician: Referring Novice Vrba: Treating Avo Schlachter/Extender: Emilee Hero in Treatment: 10 Encounter Discharge Information Items Discharge Condition: Stable Ambulatory Status: Cane Discharge Destination:  Home Transportation: Private Auto Accompanied By: self Schedule Follow-up Appointment: Yes Clinical Summary of Care: Electronic Signature(s) Signed: 04/15/2023 3:55:23 PM By: [name] Entered By: [name] on 04/15/2023 15:55:01 -------------------------------------------------------------------------------- Vitals Details Patient Name: Date of Service: [name], [name] 04/15/2023 1:30 PM Medical Record Number: [id number] Patient Account Number: [id number] Date of Birth/Sex: Treating RN: [birth year] (72 y.o. M) Primary Care Chuong Casebeer: [name] Other Clinician: Referring Duc Crocket: Treating Moon Budde/Extender: Emilee Hero in Treatment: 10 Vital Signs Time Taken: 01:30 Temperature (F): 97.9 Height (in): 70 Pulse (bpm): [address] ([id number]) [id number] Page 2 of 2 Weight (lbs): 302 Respiratory Rate (breaths/min): 18 Body Mass Index (BMI): 43.3 Blood Pressure (mmHg): 132/80 Capillary Blood Glucose (mg/dl): 366 Reference Range: 80 - 120 mg / dl Airway Pulse Oximetry (%): 98 Electronic Signature(s) Signed: 04/15/2023 3:55:23 PM By: [name] Entered By: [name] on 04/15/2023 15:50:34

## 2023-04-16 NOTE — Progress Notes (Signed)
Lawrence [name] ([id number]) [id number] Page 1 of 2 Visit Report for 04/15/2023 HBO Details Patient Name: Date of Service: Lawrence [name], Lawrence RLA ND W. 04/15/2023 1:30 PM Medical Record Number: [id number] Patient Account Number: [id number] Date of Birth/Sex: Treating RN: [birth year] (72 y.o. M) Primary Care Renise Gillies: [name] Other Clinician: Referring Braian Tijerina: Treating Morna Flud/Extender: Emilee Hero in Treatment: 10 HBO Treatment Course Details Treatment Course Number: 1 Ordering Khristen Cheyney: [name] T Treatments Ordered: otal 40 HBO Treatment Start Date: 02/24/2023 HBO Indication: Other (specify in Notes) HBO Treatment Details Treatment Number: 27 Patient Type: Outpatient Chamber Type: Monoplace Chamber Serial #: [id number] Treatment Protocol: 2.0 ATA with 90 minutes oxygen, and no air breaks Treatment Details Compression Rate Down: 2.0 psi / minute De-Compression Rate Up: 1.5 psi / minute Air breaks and breathing Decompress Decompress Compress Tx Pressure Begins Reached periods Begins Ends (leave unused spaces blank) Chamber Pressure (ATA 1 2 ------2 1 ) Clock Time (24 hr) 13:33 13:43 - - - - - - 15:14 15:23 Treatment Length: 110 (minutes) Treatment Segments: 4 Vital Signs Capillary Blood Glucose Reference Range: 80 - 120 mg / dl HBO Diabetic Blood Glucose Intervention Range: <131 mg/dl or >578 mg/dl Type: Time Vitals Blood Respiratory Capillary Blood Glucose Pulse Action Pulse: Temperature: Taken: Pressure: Rate: Glucose (mg/dl): Meter #: Oximetry (%) Taken: Pre 01:30 132/80 78 18 97.9 162 1 98 none per protocol Post 15:23 130/80 80 18 97.9 87 1 98 none per protocol Treatment Response Treatment Toleration: Well Treatment Completion Status: Treatment Completed without Adverse Event HBO Attestation I certify that I supervised this HBO treatment in accordance with Medicare guidelines. Lawrence trained emergency  response team is readily available per Yes hospital policies and procedures. Continue HBOT as ordered. Yes Electronic Signature(s) Signed: 04/15/2023 4:14:13 PM By: Geralyn Corwin DO Previous Signature: 04/15/2023 3:55:23 PM Version By: [name] Entered By: Geralyn Corwin on 04/15/2023 16:13:52 -------------------------------------------------------------------------------- HBO Safety Checklist Details Patient Name: Date of Service: Lawrence [name], Lawrence RLA ND W. 04/15/2023 1:30 PM Medical Record Number: [id number] Patient Account Number: [id number] Date of Birth/Sex: Treating RN: [name], 1951 (72 y.o. M) Primary Care Deshon Koslowski: [name] Other Clinician: Referring Jomari Bartnik: Treating Tarnesha Ulloa/Extender: Oswaldo Milian Frankclay, [name] ([id number]) [id number] Page 2 of 2 Weeks in Treatment: 10 HBO Safety Checklist Items Safety Checklist Consent Form Signed Patient voided / foley secured and emptied When did you last eato 04/15/23 Last dose of injectable or oral agent 04/15/23 gluacogan Ostomy pouch emptied and vented if applicable NA All implantable devices assessed, documented and approved NA Intravenous access site secured and place NA Valuables secured Linens and cotton and cotton/polyester blend (less than 51% polyester) Personal oil-based products / skin lotions / body lotions removed Wigs or hairpieces removed NA Smoking or tobacco materials removed NA Books / newspapers / magazines / loose paper removed Cologne, aftershave, perfume and deodorant removed Jewelry removed (may wrap wedding band) Make-up removed NA Hair care products removed Battery operated devices (external) removed NA Heating patches and chemical warmers removed NA Titanium eyewear removed NA Nail polish cured greater than 10 hours NA Casting material cured greater than 10 hours NA Hearing aids removed NA Loose dentures or partials  removed NA Prosthetics have been removed NA Patient demonstrates correct use of air break device (if applicable) Patient concerns have been addressed Patient grounding bracelet on and cord attached to chamber Specifics for Inpatients (complete in addition to above) Medication sheet sent with patient Intravenous medications needed or due  during therapy sent with patient Drainage tubes (e.g. nasogastric tube or chest tube secured and vented) Endotracheal or Tracheotomy tube secured Cuff deflated of air and inflated with saline Airway suctioned Electronic Signature(s) Signed: 04/15/2023 3:55:23 PM By: [name] Entered By: [name] on 04/15/2023 15:50:38

## 2023-04-17 ENCOUNTER — Encounter: Payer: Medicare Other | Admitting: Physician Assistant

## 2023-04-17 DIAGNOSIS — E10621 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: Secondary | ICD-10-CM | POA: Diagnosis not present

## 2023-04-17 LAB — GLUCOSE, CAPILLARY
Glucose-Capillary: 164 mg/dL — ABNORMAL HIGH (ref 70–99)
Glucose-Capillary: 135 mg/dL — ABNORMAL HIGH (ref 70–99)

## 2023-04-18 NOTE — Progress Notes (Signed)
[name] ([id number]) [id number] Page 1 of 2 Visit Report for 04/16/2023 HBO Details Patient Name: Date of Service: [name], [name] 04/16/2023 1:30 PM Medical Record Number: [id number] Patient Account Number: [id number] Date of Birth/Sex: Treating RN: 1951-10-17 (72 y.o. M) Primary Care Halla Chopp: [name] Other Clinician: Referring Kyliana Standen: Treating Guy Toney/Extender: [name] in Treatment: 10 HBO Treatment Course Details Treatment Course Number: 1 Ordering Tyiana Hill: [name] T Treatments Ordered: otal 40 HBO Treatment Start Date: 02/24/2023 HBO Indication: Other (specify in Notes) HBO Treatment Details Treatment Number: 28 Patient Type: Outpatient Chamber Type: Monoplace Chamber Serial #: [id number] Treatment Protocol: 2.0 ATA with 90 minutes oxygen, and no air breaks Treatment Details Compression Rate Down: 2.0 psi / minute De-Compression Rate Up: 1.5 psi / minute Air breaks and breathing Decompress Decompress Compress Tx Pressure Begins Reached periods Begins Ends (leave unused spaces blank) Chamber Pressure (ATA 1 2 ------2 1 ) Clock Time (24 hr) 13:32 13:42 - - - - - - 15:13 15:22 Treatment Length: 110 (minutes) Treatment Segments: 4 Vital Signs Capillary Blood Glucose Reference Range: 80 - 120 mg / dl HBO Diabetic Blood Glucose Intervention Range: <131 mg/dl or >782 mg/dl Type: Time Vitals Blood Respiratory Capillary Blood Glucose Pulse Action Pulse: Temperature: Taken: Pressure: Rate: Glucose (mg/dl): Meter #: Oximetry (%) Taken: Pre 01:30 134/80 83 18 98 165 1 99 none per protocol Post 15:22 128/80 86 18 98 145 1 99 none per protocol Treatment Response Treatment Toleration: Well Treatment Completion Status: Treatment Completed without Adverse Event Electronic Signature(s) Signed: 04/16/2023 3:43:39 PM By: [name] Signed: 04/16/2023 5:43:48 PM By: [name] Entered By:  [name] on 04/16/2023 15:42:42 [place], [name] ([id number]) [id number] Page 2 of 2 -------------------------------------------------------------------------------- HBO Safety Checklist Details Patient Name: Date of Service: [name], [name] 04/16/2023 1:30 PM Medical Record Number: [id number] Patient Account Number: [id number] Date of Birth/Sex: Treating RN: [birth year] (72 y.o. M) Primary Care Destany Severns: [name] Other Clinician: Referring Erinn Mendosa: Treating Edwing Figley/Extender: [name] in Treatment: 10 HBO Safety Checklist Items Safety Checklist Consent Form Signed Patient voided / foley secured and emptied When did you last eato 04/16/23 Last dose of injectable or oral agent 04/16/23 glucogan Ostomy pouch emptied and vented if applicable NA All implantable devices assessed, documented and approved NA Intravenous access site secured and place NA Valuables secured Linens and cotton and cotton/polyester blend (less than 51% polyester) Personal oil-based products / skin lotions / body lotions removed Wigs or hairpieces removed NA Smoking or tobacco materials removed NA Books / newspapers / magazines / loose paper removed Cologne, aftershave, perfume and deodorant removed Jewelry removed (may wrap wedding band) Make-up removed NA Hair care products removed Battery operated devices (external) removed NA Heating patches and chemical warmers removed NA Titanium eyewear removed NA Nail polish cured greater than 10 hours NA Casting material cured greater than 10 hours NA Hearing aids removed NA Loose dentures or partials removed NA Prosthetics have been removed NA Patient demonstrates correct use of air break device (if applicable) Patient concerns have been addressed Patient grounding bracelet on and cord attached to chamber Specifics for Inpatients (complete in addition to above) Medication sheet sent with  patient Intravenous medications needed or due during therapy sent with patient Drainage tubes (e.g. nasogastric tube or chest tube secured and vented) Endotracheal or Tracheotomy tube secured Cuff deflated of air and inflated with saline Airway suctioned Electronic Signature(s) Signed: 04/16/2023 3:43:39  PM By: [name] Entered By: [name] on 04/16/2023 15:41:40

## 2023-04-18 NOTE — Progress Notes (Signed)
[name] ([id number]) [id number] Page 1 of 2 Visit Report for 04/17/2023 Arrival Information Details Patient Name: Date of Service: [name], [name] 04/17/2023 10:00 A M Medical Record Number: [id number] Patient Account Number: [id number] Date of Birth/Sex: Treating RN: 1951-04-18 (72 y.o. M) Primary Care Chelesea Weiand: [name] Other Clinician: Referring Roya Gieselman: Treating Jocelynne Duquette/Extender: [name] in Treatment: 11 Visit Information History Since Last Visit Added or deleted any medications: No Patient Arrived: Cane Any new allergies or adverse reactions: No Arrival Time: 10:32 Had a fall or experienced change in No Accompanied By: wife activities of daily living that may affect Transfer Assistance: None risk of falls: Patient Identification Verified: Yes Signs or symptoms of abuse/neglect since last visito No Secondary Verification Process Completed: Yes Hospitalized since last visit: No Patient Requires Transmission-Based Precautions: No Implantable device outside of the clinic excluding No Patient Has Alerts: Yes cellular tissue based products placed in the center since last visit: Pain Present Now: No Electronic Signature(s) Signed: 04/17/2023 12:27:05 PM By: [name] Entered By: [name] on 04/17/2023 12:23:56 -------------------------------------------------------------------------------- Encounter Discharge Information Details Patient Name: Date of Service: [name], [name] 04/17/2023 10:00 A M Medical Record Number: [id number] Patient Account Number: [id number] Date of Birth/Sex: Treating RN: [birth year] (72 y.o. M) Primary Care Deneise Getty: [name] Other Clinician: Referring Myisha Pickerel: Treating Aldene Hendon/Extender: [name] in Treatment: 11 Encounter Discharge Information Items Discharge Condition: Stable Ambulatory Status: Ambulatory Discharge Destination:  Home Transportation: Private Auto Accompanied By: self Schedule Follow-up Appointment: Yes Clinical Summary of Care: ACEY, MELCHER ([id number]) [id number] Page 2 of 2 Electronic Signature(s) Signed: 04/17/2023 12:27:05 PM By: [name] Entered By: [name] on 04/17/2023 12:26:04 -------------------------------------------------------------------------------- Vitals Details Patient Name: Date of Service: [name], [name] 04/17/2023 10:00 A M Medical Record Number: [id number] Patient Account Number: [id number] Date of Birth/Sex: Treating RN: Feb 18, 1951 (72 y.o. M) Primary Care Leoma Folds: [name] Other Clinician: Referring Maritta Kief: Treating Sueellen Kayes/Extender: [name] in Treatment: 11 Vital Signs Time Taken: 10:15 Temperature (F): 97.8 Height (in): 70 Pulse (bpm): 71 Weight (lbs): 302 Respiratory Rate (breaths/min): 18 Body Mass Index (BMI): 43.3 Blood Pressure (mmHg): 136/65 Capillary Blood Glucose (mg/dl): 166 Reference Range: 80 - 120 mg / dl Airway Pulse Oximetry (%): 99 Electronic Signature(s) Signed: 04/17/2023 12:27:05 PM By: [name] Entered By: [name] on 04/17/2023 12:23:59

## 2023-04-18 NOTE — Progress Notes (Signed)
Lawrence [name] ([id number]) [id number] Page 1 of 2 Visit Report for 04/16/2023 Arrival Information Details Patient Name: Date of Service: [name], [name] 04/16/2023 1:30 PM Medical Record Number: [id number] Patient Account Number: [id number] Date of Birth/Sex: Treating RN: 03/15/1951 (72 y.o. M) Primary Care Pericles Carmicheal: [name] Other Clinician: Referring Kindsey Eblin: Treating Martice Doty/Extender: [name] in Treatment: 10 Visit Information History Since Last Visit Added or deleted any medications: No Patient Arrived: Ambulatory Any new allergies or adverse reactions: No Arrival Time: 13:30 Had a fall or experienced change in No Accompanied By: self activities of daily living that may affect Transfer Assistance: None risk of falls: Patient Identification Verified: Yes Signs or symptoms of abuse/neglect since last visito No Secondary Verification Process Completed: Yes Hospitalized since last visit: No Patient Requires Transmission-Based Precautions: No Implantable device outside of the clinic excluding No Patient Has Alerts: Yes cellular tissue based products placed in the center since last visit: Pain Present Now: No Electronic Signature(s) Signed: 04/16/2023 3:43:39 PM By: [name] Entered By: [name] on 04/16/2023 15:41:34 -------------------------------------------------------------------------------- Encounter Discharge Information Details Patient Name: Date of Service: [name], [name] 04/16/2023 1:30 PM Medical Record Number: [id number] Patient Account Number: [id number] Date of Birth/Sex: Treating RN: 1951-01-16 (72 y.o. M) Primary Care Shearon Clonch: [name] Other Clinician: Referring Sharronda Schweers: Treating Shereta Crothers/Extender: [name] in Treatment: 10 Encounter Discharge Information Items Discharge Condition: Stable Ambulatory Status: Ambulatory Discharge Destination:  Home Transportation: Private Auto Accompanied By: self Schedule Follow-up Appointment: Yes Clinical Summary of Care: Lawrence [name], Lawrence [name] ([id number]) [id number] Page 2 of 2 Electronic Signature(s) Signed: 04/16/2023 3:43:39 PM By: [name] Entered By: [name] on 04/16/2023 15:43:14 -------------------------------------------------------------------------------- Vitals Details Patient Name: Date of Service: [name], [name] 04/16/2023 1:30 PM Medical Record Number: [id number] Patient Account Number: [id number] Date of Birth/Sex: Treating RN: September 05, 1951 (72 y.o. M) Primary Care Ivanka Kirshner: [name] Other Clinician: Referring Hernando Reali: Treating Merrie Epler/Extender: [name] in Treatment: 10 Vital Signs Time Taken: 01:30 Temperature (F): 98.0 Height (in): 70 Pulse (bpm): 83 Weight (lbs): 302 Respiratory Rate (breaths/min): 18 Body Mass Index (BMI): 43.3 Blood Pressure (mmHg): 134/80 Capillary Blood Glucose (mg/dl): 244 Reference Range: 80 - 120 mg / dl Airway Pulse Oximetry (%): 99 Electronic Signature(s) Signed: 04/16/2023 3:43:39 PM By: [name] Entered By: [name] on 04/16/2023 15:41:36

## 2023-04-18 NOTE — Progress Notes (Signed)
Lawrence [name] ([id number]) [id number] Page 1 of 10 Visit Report for 04/10/2023 Arrival Information Details Patient Name: Date of Service: [name], [name] 04/10/2023 9:00 A M Medical Record Number: [id number] Patient Account Number: [id number] Date of Birth/Sex: Treating RN: 09-26-1951 (72 y.o. Lawrence [name]) [name] Primary Care Mace Weinberg: [name] Other Clinician: Referring Monserath Neff: Treating Danis Pembleton/Extender: [name] in Treatment: 10 Visit Information History Since Last Visit Added or deleted any medications: No Patient Arrived: Ambulatory Any new allergies or adverse reactions: No Arrival Time: 08:55 Had a fall or experienced change in No Accompanied By: wife activities of daily living that may affect Transfer Assistance: None risk of falls: Patient Identification Verified: Yes Signs or symptoms of abuse/neglect since last visito No Secondary Verification Process Completed: Yes Hospitalized since last visit: No Patient Requires Transmission-Based Precautions: No Implantable device outside of the clinic excluding No Patient Has Alerts: Yes cellular tissue based products placed in the center since last visit: Has Dressing in Place as Prescribed: Yes Has Compression in Place as Prescribed: Yes Pain Present Now: No Electronic Signature(s) Signed: 04/16/2023 4:13:25 PM By: [name] RN Entered By: [name] on 04/10/2023 09:00:59 -------------------------------------------------------------------------------- Clinic Level of Care Assessment Details Patient Name: Date of Service: [name], [name] 04/10/2023 9:00 A M Medical Record Number: [id number] Patient Account Number: [id number] Date of Birth/Sex: Treating RN: [birth year] (72 y.o. Lawrence [name]) [name] Primary Care Akili Corsetti: [name] Other Clinician: Referring Lita Flynn: Treating Carnie Bruemmer/Extender: [name] in Treatment:  10 Clinic Level of Care Assessment Items TOOL 1 Quantity Score []  - 0 Use when EandM and Procedure is performed on INITIAL visit ASSESSMENTS - Nursing Assessment / Reassessment []  - 0 General Physical Exam (combine w/ comprehensive assessment (listed just below) when performed on new pt. evals) []  - 0 Comprehensive Assessment (HX, ROS, Risk Assessments, Wounds Hx, etc.) Lawrence [name], Lawrence [name] ([id number]) [id number] Page 2 of 10 ASSESSMENTS - Wound and Skin Assessment / Reassessment []  - 0 Dermatologic / Skin Assessment (not related to wound area) ASSESSMENTS - Ostomy and/or Continence Assessment and Care []  - 0 Incontinence Assessment and Management []  - 0 Ostomy Care Assessment and Management (repouching, etc.) PROCESS - Coordination of Care []  - 0 Simple Patient / Family Education for ongoing care []  - 0 Complex (extensive) Patient / Family Education for ongoing care []  - 0 Staff obtains Chiropractor, Records, T Results / Process Orders est []  - 0 Staff telephones HHA, Nursing Homes / Clarify orders / etc []  - 0 Routine Transfer to another Facility (non-emergent condition) []  - 0 Routine Hospital Admission (non-emergent condition) []  - 0 New Admissions / Manufacturing engineer / Ordering NPWT Apligraf, etc. , []  - 0 Emergency Hospital Admission (emergent condition) PROCESS - Special Needs []  - 0 Pediatric / Minor Patient Management []  - 0 Isolation Patient Management []  - 0 Hearing / Language / Visual special needs []  - 0 Assessment of Community assistance (transportation, D/C planning, etc.) []  - 0 Additional assistance / Altered mentation []  - 0 Support Surface(s) Assessment (bed, cushion, seat, etc.) INTERVENTIONS - Miscellaneous []  - 0 External ear exam []  - 0 Patient Transfer (multiple staff / Nurse, adult / Similar devices) []  - 0 Simple Staple / Suture removal (25 or less) []  - 0 Complex Staple / Suture removal (26 or more) []  -  0 Hypo/Hyperglycemic Management (do not check if billed separately) []  - 0 Ankle / Brachial Index (ABI) - do not check  if billed separately Has the patient been seen at the hospital within the last three years: Yes Total Score: 0 Level Of Care: ____ Electronic Signature(s) Signed: 04/16/2023 4:13:25 PM By: [name] RN Entered By: [name] on 04/10/2023 09:39:45 -------------------------------------------------------------------------------- Encounter Discharge Information Details Patient Name: Date of Service: [name], [name] 04/10/2023 9:00 A M Medical Record Number: [id number] Patient Account Number: [id number] Date of Birth/Sex: Treating RN: December 23, 1950 (72 y.o. Lawrence [name] Primary Care Davina Howlett: [name] Other Clinician: Referring Edwin Baines: Treating Yonna Alwin/Extender: [name], Cure ([id number]) [id number] Page 3 of 10 Weeks in Treatment: 10 Encounter Discharge Information Items Post Procedure Vitals Discharge Condition: Stable Temperature (F): 97.9 Ambulatory Status: Cane Pulse (bpm): 65 Discharge Destination: Home Respiratory Rate (breaths/min): 18 Transportation: Private Auto Blood Pressure (mmHg): 156/72 Accompanied By: wife Schedule Follow-up Appointment: Yes Clinical Summary of Care: Electronic Signature(s) Signed: 04/16/2023 4:13:25 PM By: [name] RN Entered By: [name] on 04/10/2023 09:40:31 -------------------------------------------------------------------------------- Lower Extremity Assessment Details Patient Name: Date of Service: [name], [name] 04/10/2023 9:00 A M Medical Record Number: [id number] Patient Account Number: [id number] Date of Birth/Sex: Treating RN: 09-Jul-1951 (72 y.o. Lawrence [name]) [name] Primary Care Yadier Bramhall: [name] Other Clinician: Referring Agustina Witzke: Treating Sayeed Weatherall/Extender: [name] in Treatment: 10 Edema  Assessment Assessed: [Left: No] [Right: No] [Left: Edema] [Right: :] Calf Left: Right: Point of Measurement: 33 cm From Medial Instep 41.5 cm 42 cm Ankle Left: Right: Point of Measurement: 10 cm From Medial Instep 27 cm 25 cm Vascular Assessment Pulses: Dorsalis Pedis Palpable: [Left:Yes] [Right:Yes] Electronic Signature(s) Signed: 04/16/2023 4:13:25 PM By: [name] RN Entered By: [name] on 04/10/2023 09:09:35 Lawrence [name], Lawrence [name] ([id number]) [id number] Page 4 of 10 -------------------------------------------------------------------------------- Multi Wound Chart Details Patient Name: Date of Service: [name], [name] 04/10/2023 9:00 A M Medical Record Number: [id number] Patient Account Number: [id number] Date of Birth/Sex: Treating RN: September 15, 1951 (72 y.o. Lawrence [name]) [name] Primary Care Rebeccah Ivins: [name] Other Clinician: Referring Meldon Hanzlik: Treating Mae Denunzio/Extender: [name] in Treatment: 10 Vital Signs Height(in): 70 Pulse(bpm): 65 Weight(lbs): 302 Blood Pressure(mmHg): 156/72 Body Mass Index(BMI): 43.3 Temperature(F): 97.9 Respiratory Rate(breaths/min): 18 [1:Photos:] Right T Second oe Right T Third oe Left T Third oe Wound Location: Gradually Appeared Gradually Appeared Gradually Appeared Wounding Event: Diabetic Wound/Ulcer of the Lower Diabetic Wound/Ulcer of the Lower Diabetic Wound/Ulcer of the Lower Primary Etiology: Extremity Extremity Extremity Glaucoma, Coronary Artery Disease, Glaucoma, Coronary Artery Disease, Glaucoma, Coronary Artery Disease, Comorbid History: Hypertension, Myocardial Infarction, Hypertension, Myocardial Infarction, Hypertension, Myocardial Infarction, Type II Diabetes, End Stage Renal Type II Diabetes, End Stage Renal Type II Diabetes, End Stage Renal Disease Disease Disease 11/03/2022 11/03/2022 12/05/2022 Date Acquired: 10 10 10  Weeks of Treatment: Open Open  Open Wound Status: No No No Wound Recurrence: Yes Yes Yes Pending A mputation on Presentation: 0.7x0.8x0.1 0.7x0.8x0.1 1x1.4x0.4 Measurements L x W x D (cm) 0.44 0.44 1.1 A (cm) : rea 0.044 0.044 0.44 Volume (cm) : 43.90% 6.60% 41.60% % Reduction in A rea: 81.40% 6.40% -16.70% % Reduction in Volume: Grade 3 Grade 3 Grade 3 Classification: Medium Medium Medium Exudate A mount: Serosanguineous Serosanguineous Serosanguineous Exudate Type: red, brown red, brown red, brown Exudate Color: Distinct, outline attached Distinct, outline attached N/A Wound Margin: Small (1-33%) None Present (0%) Small (1-33%) Granulation A mount: Pink N/A Pink Granulation Quality: Large (67-100%) Large (67-100%) Large (67-100%) Necrotic A mount: Bone: Yes  Fat Layer (Subcutaneous Tissue): Yes Fat Layer (Subcutaneous Tissue): Yes Exposed Structures: Fat Layer (Subcutaneous Tissue): No Fascia: No Fascia: No Tendon: No Tendon: No Muscle: No Muscle: No Joint: No Joint: No Bone: No Bone: No None None N/A Epithelialization: Treatment Notes Electronic Signature(s) Lawrence [name] ([id number]) [id number] Page 5 of 10 Signed: 04/16/2023 4:13:25 PM By: [name] RN Entered By: [name] on 04/10/2023 09:09:43 -------------------------------------------------------------------------------- Multi-Disciplinary Care Plan Details Patient Name: Date of Service: [name], [name] 04/10/2023 9:00 A M Medical Record Number: [id number] Patient Account Number: [id number] Date of Birth/Sex: Treating RN: 1951/02/08 (72 y.o. Lawrence [name]) [name] Primary Care Desean Heemstra: [name] Other Clinician: Referring Taevion Sikora: Treating Aisia Correira/Extender: [name] in Treatment: 10 Active Inactive Wound/Skin Impairment Nursing Diagnoses: Knowledge deficit related to ulceration/compromised skin integrity Goals: Patient/caregiver will verbalize  understanding of skin care regimen Date Initiated: 01/30/2023 Target Resolution Date: 05/02/2023 Goal Status: Active Ulcer/skin breakdown will have a volume reduction of 30% by week 4 Date Initiated: 01/30/2023 Date Inactivated: 03/06/2023 Target Resolution Date: 03/02/2023 Goal Status: Unmet Unmet Reason: comorbidities Ulcer/skin breakdown will have a volume reduction of 50% by week 8 Date Initiated: 01/30/2023 Date Inactivated: 04/10/2023 Target Resolution Date: 04/01/2023 Goal Status: Unmet Unmet Reason: comorbidities Ulcer/skin breakdown will have a volume reduction of 80% by week 12 Date Initiated: 01/30/2023 Target Resolution Date: 05/02/2023 Goal Status: Active Ulcer/skin breakdown will heal within 14 weeks Date Initiated: 01/30/2023 Target Resolution Date: 06/01/2023 Goal Status: Active Interventions: Assess patient/caregiver ability to obtain necessary supplies Assess patient/caregiver ability to perform ulcer/skin care regimen upon admission and as needed Assess ulceration(s) every visit Notes: Electronic Signature(s) Signed: 04/16/2023 4:13:25 PM By: [name] RN Entered By: [name] on 04/10/2023 09:10:18 Lawrence [name], Lawrence [name] ([id number]) [id number] Page 6 of 10 -------------------------------------------------------------------------------- Pain Assessment Details Patient Name: Date of Service: [name], [name] 04/10/2023 9:00 A M Medical Record Number: [id number] Patient Account Number: [id number] Date of Birth/Sex: Treating RN: 27-May-1951 (72 y.o. Lawrence [name]) [name] Primary Care Elvis Boot: [name] Other Clinician: Referring Latrail Pounders: Treating Denissa Cozart/Extender: [name] in Treatment: 10 Active Problems Location of Pain Severity and Description of Pain Patient Has Paino No Site Locations Pain Management and Medication Current Pain Management: Electronic Signature(s) Signed: 04/16/2023 4:13:25 PM By: [name] RN Entered By: [name] on 04/10/2023 09:01:30 -------------------------------------------------------------------------------- Patient/Caregiver Education Details Patient Name: Date of Service: [name] MS, [name] 6/7/2024andnbsp9:00 A M Medical Record Number: [id number] Patient Account Number: [id number] Date of Birth/Gender: Treating RN: 1951/08/24 (72 y.o. Lawrence [name] Primary Care Physician: [name] Other Clinician: Referring Physician: Treating Physician/Extender: [name] in Treatment: [address] Lawrence [name] ([id number]) [id number] Page 7 of 10 Education Assessment Education Provided To: Patient Education Topics Provided Wound/Skin Impairment: Handouts: Caring for Your Ulcer Methods: Explain/Verbal Responses: State content correctly Electronic Signature(s) Signed: 04/16/2023 4:13:25 PM By: [name] RN Entered By: [name] on 04/10/2023 09:10:31 -------------------------------------------------------------------------------- Wound Assessment Details Patient Name: Date of Service: [name], [name] 04/10/2023 9:00 A M Medical Record Number: [id number] Patient Account Number: [id number] Date of Birth/Sex: Treating RN: [birth year] (72 y.o. Lawrence [name] Primary Care Chiann Goffredo: [name] Other Clinician: Referring Shylo Zamor: Treating Lawrence [name]/Extender: [name] in Treatment: 10 Wound Status Wound Number: 1 Primary Diabetic Wound/Ulcer of the Lower Extremity Etiology: Wound Location: Right T Second oe Wound Open Wounding Event: Gradually Appeared Status: Date Acquired: 11/03/2022 Comorbid  Glaucoma, Coronary Artery Disease, Hypertension, Myocardial Weeks Of Treatment: 10 History: Infarction, Type II Diabetes, End Stage Renal Disease Clustered Wound: No Pending Amputation On Presentation Photos Wound Measurements Length: (cm) 0.7 Width: (cm) 0.8 Depth:  (cm) 0.1 Area: (cm) 0.44 Volume: (cm) 0.044 % Reduction in Area: 43.9% % Reduction in Volume: 81.4% Epithelialization: None Tunneling: No Undermining: No Wound Description Classification: Grade 3 Wound Margin: Distinct, outline attached Lawrence [name] ([id number]) Exudate Amount: Medium Exudate Type: Serosanguineous Exudate Color: red, brown Foul Odor After Cleansing: No Slough/Fibrino Yes [id number] Page 8 of 10 Wound Bed Granulation Amount: Small (1-33%) Exposed Structure Granulation Quality: Pink Fat Layer (Subcutaneous Tissue) Exposed: No Necrotic Amount: Large (67-100%) Bone Exposed: Yes Necrotic Quality: Adherent Scientist, physiological) Signed: 04/16/2023 4:13:25 PM By: [name] RN Entered By: [name] on 04/10/2023 09:07:26 -------------------------------------------------------------------------------- Wound Assessment Details Patient Name: Date of Service: [name], [name] 04/10/2023 9:00 A M Medical Record Number: [id number] Patient Account Number: [id number] Date of Birth/Sex: Treating RN: September 22, 1951 (72 y.o. Lawrence [name]) [name] Primary Care Macenzie Burford: [name] Other Clinician: Referring [place] Lesure: Treating Lawrence [name]/Extender: [name] in Treatment: 10 Wound Status Wound Number: 2 Primary Diabetic Wound/Ulcer of the Lower Extremity Etiology: Wound Location: Right T Third oe Wound Open Wounding Event: Gradually Appeared Status: Date Acquired: 11/03/2022 Comorbid Glaucoma, Coronary Artery Disease, Hypertension, Myocardial Weeks Of Treatment: 10 History: Infarction, Type II Diabetes, End Stage Renal Disease Clustered Wound: No Pending Amputation On Presentation Photos Wound Measurements Length: (cm) 0.7 Width: (cm) 0.8 Depth: (cm) 0.1 Area: (cm) 0.44 Volume: (cm) 0.044 % Reduction in Area: 6.6% % Reduction in Volume: 6.4% Epithelialization: None Tunneling: No Undermining:  No Wound Description Classification: Grade 3 Wound Margin: Distinct, outline attached Exudate Amount: Medium Exudate Type: Serosanguineous Exudate Color: red, brown Lawrence [name], Lawrence [name] ([id number]) Foul Odor After Cleansing: No Slough/Fibrino Yes [id number] Page 9 of 10 Wound Bed Granulation Amount: None Present (0%) Exposed Structure Necrotic Amount: Large (67-100%) Fascia Exposed: No Necrotic Quality: Adherent Slough Fat Layer (Subcutaneous Tissue) Exposed: Yes Tendon Exposed: No Muscle Exposed: No Joint Exposed: No Bone Exposed: No Electronic Signature(s) Signed: 04/16/2023 4:13:25 PM By: [name] RN Entered By: [name] on 04/10/2023 09:07:45 -------------------------------------------------------------------------------- Wound Assessment Details Patient Name: Date of Service: [name], [name] 04/10/2023 9:00 A M Medical Record Number: [id number] Patient Account Number: [id number] Date of Birth/Sex: Treating RN: 1950-12-27 (72 y.o. Lawrence [name]) [name] Primary Care Johnpaul Gillentine: [name] Other Clinician: Referring Yaira Bernardi: Treating Lawrence [name]/Extender: [name] in Treatment: 10 Wound Status Wound Number: 4 Primary Diabetic Wound/Ulcer of the Lower Extremity Etiology: Wound Location: Left T Third oe Wound Open Wounding Event: Gradually Appeared Status: Date Acquired: 12/05/2022 Comorbid Glaucoma, Coronary Artery Disease, Hypertension, Myocardial Weeks Of Treatment: 10 History: Infarction, Type II Diabetes, End Stage Renal Disease Clustered Wound: No Pending Amputation On Presentation Photos Wound Measurements Length: (cm) 1 Width: (cm) 1.4 Depth: (cm) 0.4 Area: (cm) 1.1 Volume: (cm) 0.44 % Reduction in Area: 41.6% % Reduction in Volume: -16.7% Tunneling: No Undermining: No Wound Description Classification: Grade 3 Exudate Amount: Medium Exudate Type: Serosanguineous Exudate Color: red,  brown Wound Bed Lawrence [name] ([id number]) [id number] Page 10 of 10 Granulation Amount: Small (1-33%) Exposed Structure Granulation Quality: Pink Fascia Exposed: No Necrotic Amount: Large (67-100%) Fat Layer (Subcutaneous Tissue) Exposed: Yes Necrotic Quality: Adherent Slough Tendon Exposed: No Muscle Exposed: No Joint Exposed: No Bone Exposed: No Electronic Signature(s) Signed: 04/16/2023 4:13:25 PM By:  [name] RN Entered By: [name] on 04/10/2023 09:08:06 -------------------------------------------------------------------------------- Vitals Details Patient Name: Date of Service: [name], [name] 04/10/2023 9:00 A M Medical Record Number: [id number] Patient Account Number: [id number] Date of Birth/Sex: Treating RN: 1951/02/03 (72 y.o. Lawrence [name]) [name] Primary Care Laykin Rainone: [name] Other Clinician: Referring Mathias Bogacki: Treating Lawrence [name]/Extender: [name] in Treatment: 10 Vital Signs Time Taken: 09:00 Temperature (F): 97.9 Height (in): 70 Pulse (bpm): 65 Weight (lbs): 302 Respiratory Rate (breaths/min): 18 Body Mass Index (BMI): 43.3 Blood Pressure (mmHg): 156/72 Reference Range: 80 - 120 mg / dl Electronic Signature(s) Signed: 04/16/2023 4:13:25 PM By: [name] RN Entered By: [name] on 04/10/2023 09:01:21

## 2023-04-18 NOTE — Progress Notes (Signed)
Lawrence [name] ([id number]) [id number] Page 1 of 11 Visit Report for 04/17/2023 Arrival Information Details Patient Name: Date of Service: [name], [name] 04/17/2023 9:00 A M Medical Record Number: [id number] Patient Account Number: [id number] Date of Birth/Sex: Treating RN: Mar 28, 1951 (72 y.o. Lawrence [name]) Jettie Pagan, Lawrence [name] Primary Care Lawrence [name]: Lawrence [name] Other Clinician: Referring Lawrence [name]: Treating Lawrence [name]/Extender: Lawrence [name] in Treatment: 11 Visit Information History Since Last Visit Added or deleted any medications: No Patient Arrived: Ambulatory Any new allergies or adverse reactions: No Arrival Time: 08:52 Had a fall or experienced change in No Accompanied By: wife activities of daily living that may affect Transfer Assistance: None risk of falls: Patient Identification Verified: Yes Signs or symptoms of abuse/neglect since last visito No Secondary Verification Process Completed: Yes Hospitalized since last visit: No Patient Requires Transmission-Based Precautions: No Implantable device outside of the clinic excluding No Patient Has Alerts: Yes cellular tissue based products placed in the center since last visit: Pain Present Now: No Electronic Signature(s) Signed: 04/17/2023 9:22:01 AM By: Lawrence Pax RN Entered By: Lawrence [name] on 04/17/2023 08:54:09 -------------------------------------------------------------------------------- Clinic Level of Care Assessment Details Patient Name: Date of Service: [name], [name] 04/17/2023 9:00 A M Medical Record Number: [id number] Patient Account Number: [id number] Date of Birth/Sex: Treating RN: 1951-04-03 (72 y.o. Lawrence [name]) Lawrence [name] Primary Care Spruha Weight: Lawrence [name] Other Clinician: Referring Lawrence [name]: Treating Lawrence [name]/Extender: Lawrence [name] in Treatment: 11 Clinic Level of Care Assessment Items TOOL 1 Quantity Score []  - 0 Use when EandM  and Procedure is performed on INITIAL visit ASSESSMENTS - Nursing Assessment / Reassessment []  - 0 General Physical Exam (combine w/ comprehensive assessment (listed just below) when performed on new pt. evals) []  - 0 Comprehensive Assessment (HX, ROS, Risk Assessments, Wounds Hx, etc.) ASSESSMENTS - Wound and Skin Assessment / Reassessment [name], [name] ([id number]) [id number] Page 2 of 11 []  - 0 Dermatologic / Skin Assessment (not related to wound area) ASSESSMENTS - Ostomy and/or Continence Assessment and Care []  - 0 Incontinence Assessment and Management []  - 0 Ostomy Care Assessment and Management (repouching, etc.) PROCESS - Coordination of Care []  - 0 Simple Patient / Family Education for ongoing care []  - 0 Complex (extensive) Patient / Family Education for ongoing care []  - 0 Staff obtains Chiropractor, Records, T Results / Process Orders est []  - 0 Staff telephones HHA, Nursing Homes / Clarify orders / etc []  - 0 Routine Transfer to another Facility (non-emergent condition) []  - 0 Routine Hospital Admission (non-emergent condition) []  - 0 New Admissions / Manufacturing engineer / Ordering NPWT Apligraf, etc. , []  - 0 Emergency Hospital Admission (emergent condition) PROCESS - Special Needs []  - 0 Pediatric / Minor Patient Management []  - 0 Isolation Patient Management []  - 0 Hearing / Language / Visual special needs []  - 0 Assessment of Community assistance (transportation, D/C planning, etc.) []  - 0 Additional assistance / Altered mentation []  - 0 Support Surface(s) Assessment (bed, cushion, seat, etc.) INTERVENTIONS - Miscellaneous []  - 0 External ear exam []  - 0 Patient Transfer (multiple staff / Nurse, adult / Similar devices) []  - 0 Simple Staple / Suture removal (25 or less) []  - 0 Complex Staple / Suture removal (26 or more) []  - 0 Hypo/Hyperglycemic Management (do not check if billed separately) []  - 0 Ankle /  Brachial Index (ABI) - do not check if billed separately Has the patient been seen at the hospital within the last  three years: Yes Total Score: 0 Level Of Care: ____ Electronic Signature(s) Unsigned Entered By: Lawrence [name] on 04/17/2023 09:48:59 -------------------------------------------------------------------------------- Encounter Discharge Information Details Patient Name: Date of Service: [name], [name] 04/17/2023 9:00 A M Medical Record Number: [id number] Patient Account Number: [id number] Date of Birth/Sex: Treating RN: [birth year] (72 y.o. Lawrence [name] Primary Care Lawrence [name]: Lawrence [name] Other Clinician: Referring Lawrence [name]: Treating Lawrence [name]/Extender: Lawrence [name] ([id number]) [id number] Page 3 of 11 Weeks in Treatment: 11 Encounter Discharge Information Items Post Procedure Vitals Discharge Condition: Stable Temperature (F): 97.8 Ambulatory Status: Ambulatory Pulse (bpm): 71 Discharge Destination: Home Respiratory Rate (breaths/min): 18 Transportation: Private Auto Blood Pressure (mmHg): 136/54 Accompanied By: wife Schedule Follow-up Appointment: Yes Clinical Summary of Care: Electronic Signature(s) Unsigned Entered ByYevonne [name] on 04/17/2023 09:49:50 -------------------------------------------------------------------------------- Lower Extremity Assessment Details Patient Name: Date of Service: [name], [name] 04/17/2023 9:00 A M Medical Record Number: [id number] Patient Account Number: [id number] Date of Birth/Sex: Treating RN: [birth year] (72 y.o. Lawrence [name]) Lawrence [name] Primary Care Lawrence [name]: Lawrence [name] Other Clinician: Referring Lawrence [name]: Treating Lawrence [name]/Extender: Lawrence [name] in Treatment: 11 Edema Assessment Assessed: [Left: No] [Right: No] [Left: Edema] [Right: :] Calf Left: Right: Point of Measurement: 33 cm From Medial Instep 44 cm 45  cm Ankle Left: Right: Point of Measurement: 10 cm From Medial Instep 31 cm 30 cm Vascular Assessment Pulses: Dorsalis Pedis Palpable: [Left:Yes] [Right:Yes] Electronic Signature(s) Signed: 04/17/2023 9:22:01 AM By: Lawrence Pax RN Entered By: Lawrence [name] on 04/17/2023 09:01:10 Lawrence [name] ([id number]) [id number] Page 4 of 11 -------------------------------------------------------------------------------- Multi Wound Chart Details Patient Name: Date of Service: [name], [name] 04/17/2023 9:00 A M Medical Record Number: [id number] Patient Account Number: [id number] Date of Birth/Sex: Treating RN: 1951-02-08 (72 y.o. Lawrence [name]) Lawrence [name] Primary Care Pennye Beeghly: Lawrence [name] Other Clinician: Referring Antwann Preziosi: Treating Adriana Lina/Extender: Lawrence [name] in Treatment: 11 Vital Signs Height(in): 70 Pulse(bpm): 71 Weight(lbs): 302 Blood Pressure(mmHg): 136/54 Body Mass Index(BMI): 43.3 Temperature(F): 97.8 Respiratory Rate(breaths/min): 18 [1:Photos:] Right T Second oe Right T Third oe Left T Third oe Wound Location: Gradually Appeared Gradually Appeared Gradually Appeared Wounding Event: Diabetic Wound/Ulcer of the Lower Diabetic Wound/Ulcer of the Lower Diabetic Wound/Ulcer of the Lower Primary Etiology: Extremity Extremity Extremity Glaucoma, Coronary Artery Disease, Glaucoma, Coronary Artery Disease, Glaucoma, Coronary Artery Disease, Comorbid History: Hypertension, Myocardial Infarction, Hypertension, Myocardial Infarction, Hypertension, Myocardial Infarction, Type II Diabetes, End Stage Renal Type II Diabetes, End Stage Renal Type II Diabetes, End Stage Renal Disease Disease Disease 11/03/2022 11/03/2022 12/05/2022 Date Acquired: 11 11 11  Weeks of Treatment: Open Open Open Wound Status: No No No Wound Recurrence: Yes Yes Yes Pending A mputation on Presentation: 0.6x0.5x0.1 0.4x0.5x0.1 0.4x0.5x0.3 Measurements L x  W x D (cm) 0.236 0.157 0.157 A (cm) : rea 0.024 0.016 0.047 Volume (cm) : 69.90% 66.70% 91.70% % Reduction in A rea: 89.80% 66.00% 87.50% % Reduction in Volume: Grade 3 Grade 3 Grade 3 Classification: Medium Medium Medium Exudate A mount: Serosanguineous Serosanguineous Serosanguineous Exudate Type: red, brown red, brown red, brown Exudate Color: Distinct, outline attached Distinct, outline attached N/A Wound Margin: Small (1-33%) None Present (0%) Small (1-33%) Granulation A mount: Pink N/A Pink Granulation Quality: Large (67-100%) Large (67-100%) Large (67-100%) Necrotic A mount: Bone: Yes Fat Layer (Subcutaneous Tissue): Yes Fat Layer (Subcutaneous Tissue): Yes Exposed Structures: Fat Layer (Subcutaneous Tissue): No Fascia: No Fascia: No Tendon: No Tendon: No Muscle: No Muscle:  No Joint: No Joint: No Bone: No Bone: No None None N/A Epithelialization: Treatment Notes Electronic Signature(s) Lawrence [name] ([id number]) [id number] Page 5 of 11 Signed: 04/17/2023 9:22:01 AM By: Lawrence Pax RN Entered By: Lawrence [name] on 04/17/2023 09:01:15 -------------------------------------------------------------------------------- Multi-Disciplinary Care Plan Details Patient Name: Date of Service: [name], [name] 04/17/2023 9:00 A M Medical Record Number: [id number] Patient Account Number: [id number] Date of Birth/Sex: Treating RN: 01-Jun-1951 (72 y.o. Lawrence [name]) Lawrence [name] Primary Care Daysie Helf: Lawrence [name] Other Clinician: Referring Huxton Glaus: Treating Messi Twedt/Extender: Lawrence [name] in Treatment: 11 Active Inactive Wound/Skin Impairment Nursing Diagnoses: Knowledge deficit related to ulceration/compromised skin integrity Goals: Patient/caregiver will verbalize understanding of skin care regimen Date Initiated: 01/30/2023 Target Resolution Date: 05/02/2023 Goal Status: Active Ulcer/skin breakdown will have a  volume reduction of 30% by week 4 Date Initiated: 01/30/2023 Date Inactivated: 03/06/2023 Target Resolution Date: 03/02/2023 Goal Status: Unmet Unmet Reason: comorbidities Ulcer/skin breakdown will have a volume reduction of 50% by week 8 Date Initiated: 01/30/2023 Date Inactivated: 04/10/2023 Target Resolution Date: 04/01/2023 Goal Status: Unmet Unmet Reason: comorbidities Ulcer/skin breakdown will have a volume reduction of 80% by week 12 Date Initiated: 01/30/2023 Target Resolution Date: 05/02/2023 Goal Status: Active Ulcer/skin breakdown will heal within 14 weeks Date Initiated: 01/30/2023 Target Resolution Date: 06/01/2023 Goal Status: Active Interventions: Assess patient/caregiver ability to obtain necessary supplies Assess patient/caregiver ability to perform ulcer/skin care regimen upon admission and as needed Assess ulceration(s) every visit Notes: Electronic Signature(s) Signed: 04/17/2023 9:22:01 AM By: Lawrence Pax RN Entered By: Lawrence [name] on 04/17/2023 09:01:42 Lawrence [name] ([id number]) [id number] Page 6 of 11 -------------------------------------------------------------------------------- Pain Assessment Details Patient Name: Date of Service: [name], [name] 04/17/2023 9:00 A M Medical Record Number: [id number] Patient Account Number: [id number] Date of Birth/Sex: Treating RN: 12-19-1950 (72 y.o. Lawrence [name]) Lawrence [name] Primary Care Iyana Topor: Lawrence [name] Other Clinician: Referring Danaisha Celli: Treating Yue Glasheen/Extender: Lawrence [name] in Treatment: 11 Active Problems Location of Pain Severity and Description of Pain Patient Has Paino No Site Locations Pain Management and Medication Current Pain Management: Electronic Signature(s) Signed: 04/17/2023 9:22:01 AM By: Lawrence Pax RN Entered By: Lawrence [name] on 04/17/2023  08:55:26 -------------------------------------------------------------------------------- Patient/Caregiver Education Details Patient Name: Date of Service: [name] MS, [name] 6/14/2024andnbsp9:00 A M Medical Record Number: [id number] Patient Account Number: [id number] Date of Birth/Gender: Treating RN: 1951/08/19 (72 y.o. Lawrence [name] Primary Care Physician: Lawrence [name] Other Clinician: Referring Physician: Treating Physician/Extender: Lawrence [name] in Treatment: [address], [name] ([id number]) [id number] Page 7 of 11 Education Assessment Education Provided To: Patient Education Topics Provided Wound/Skin Impairment: Handouts: Caring for Your Ulcer Methods: Explain/Verbal Responses: State content correctly Electronic Signature(s) Signed: 04/17/2023 9:22:01 AM By: Lawrence Pax RN Entered By: Lawrence [name] on 04/17/2023 09:01:57 -------------------------------------------------------------------------------- Wound Assessment Details Patient Name: Date of Service: [name], [name] 04/17/2023 9:00 A M Medical Record Number: [id number] Patient Account Number: [id number] Date of Birth/Sex: Treating RN: 1951-09-07 (72 y.o. Lawrence [name]) Lawrence [name] Primary Care Jamien Casanova: Lawrence [name] Other Clinician: Referring Caidon Foti: Treating Malcom Selmer/Extender: Lawrence [name] in Treatment: 11 Wound Status Wound Number: 1 Primary Diabetic Wound/Ulcer of the Lower Extremity Etiology: Wound Location: Right T Second oe Wound Open Wounding Event: Gradually Appeared Status: Date Acquired: 11/03/2022 Comorbid Glaucoma, Coronary Artery Disease, Hypertension, Myocardial Weeks Of Treatment: 11 History: Infarction, Type II Diabetes, End Stage Renal Disease Clustered Wound: No Pending Amputation On Presentation Photos Wound  Measurements Length: (cm) 0.6 Width: (cm) 0.5 Depth: (cm) 0.1 Area: (cm) 0.236 Volume: (cm) 0.024 %  Reduction in Area: 69.9% % Reduction in Volume: 89.8% Epithelialization: None Tunneling: No Undermining: No Wound Description Classification: Grade 3 Wound Margin: Distinct, outline attached Lawrence [name] ([id number]) Exudate Amount: Medium Exudate Type: Serosanguineous Exudate Color: red, brown Foul Odor After Cleansing: No Slough/Fibrino Yes [id number] Page 8 of 11 Wound Bed Granulation Amount: Small (1-33%) Exposed Structure Granulation Quality: Pink Fat Layer (Subcutaneous Tissue) Exposed: No Necrotic Amount: Large (67-100%) Bone Exposed: Yes Necrotic Quality: Adherent Slough Treatment Notes Wound #1 (Toe Second) Wound Laterality: Right Cleanser Soap and Water Discharge Instruction: Gently cleanse wound with antibacterial soap, rinse and pat dry prior to dressing wounds Peri-Wound Care Topical Primary Dressing Silvercel Small 2x2 (in/in) Discharge Instruction: Apply Silvercel Small 2x2 (in/in) as instructed Secondary Dressing Coverlet Latex-Free Fabric Adhesive Dressings Discharge Instruction: 1.5 x 2 Secured With Compression Wrap Compression Stockings Add-Ons Electronic Signature(s) Signed: 04/17/2023 9:22:01 AM By: Lawrence Pax RN Entered By: Lawrence [name] on 04/17/2023 08:59:07 -------------------------------------------------------------------------------- Wound Assessment Details Patient Name: Date of Service: [name], [name] 04/17/2023 9:00 A M Medical Record Number: [id number] Patient Account Number: [id number] Date of Birth/Sex: Treating RN: 06-04-1951 (72 y.o. Lawrence [name]) Lawrence [name] Primary Care Demari Gales: Lawrence [name] Other Clinician: Referring Tasheema Perrone: Treating Harvir Patry/Extender: Lawrence [name] in Treatment: 11 Wound Status Wound Number: 2 Primary Diabetic Wound/Ulcer of the Lower Extremity Etiology: Wound Location: Right T Third oe Wound Open Wounding Event: Gradually Appeared Status: Date  Acquired: 11/03/2022 Comorbid Glaucoma, Coronary Artery Disease, Hypertension, Myocardial Weeks Of Treatment: 11 History: Infarction, Type II Diabetes, End Stage Renal Disease Clustered Wound: No Pending Amputation On Presentation Photos FERD, MUSA ([id number]) [id number] Page 9 of 11 Wound Measurements Length: (cm) 0.4 Width: (cm) 0.5 Depth: (cm) 0.1 Area: (cm) 0.157 Volume: (cm) 0.016 % Reduction in Area: 66.7% % Reduction in Volume: 66% Epithelialization: None Tunneling: No Undermining: No Wound Description Classification: Grade 3 Wound Margin: Distinct, outline attached Exudate Amount: Medium Exudate Type: Serosanguineous Exudate Color: red, brown Foul Odor After Cleansing: No Slough/Fibrino Yes Wound Bed Granulation Amount: None Present (0%) Exposed Structure Necrotic Amount: Large (67-100%) Fascia Exposed: No Necrotic Quality: Adherent Slough Fat Layer (Subcutaneous Tissue) Exposed: Yes Tendon Exposed: No Muscle Exposed: No Joint Exposed: No Bone Exposed: No Treatment Notes Wound #2 (Toe Third) Wound Laterality: Right Cleanser Soap and Water Discharge Instruction: Gently cleanse wound with antibacterial soap, rinse and pat dry prior to dressing wounds Peri-Wound Care Topical Primary Dressing Silvercel Small 2x2 (in/in) Discharge Instruction: Apply Silvercel Small 2x2 (in/in) as instructed Secondary Dressing Coverlet Latex-Free Fabric Adhesive Dressings Discharge Instruction: 1.5 x 2 Secured With Compression Wrap Compression Stockings Add-Ons Electronic Signature(s) Signed: 04/17/2023 9:22:01 AM By: Lawrence Pax RN Entered By: Lawrence [name] on 04/17/2023 08:59:28 Lawrence [name] ([id number]) [id number] Page 10 of 11 -------------------------------------------------------------------------------- Wound Assessment Details Patient Name: Date of Service: [name], [name] 04/17/2023 9:00 A  M Medical Record Number: [id number] Patient Account Number: [id number] Date of Birth/Sex: Treating RN: 07-20-1951 (72 y.o. Lawrence [name]) Lawrence [name] Primary Care Kiante Ciavarella: Lawrence [name] Other Clinician: Referring Latrena Benegas: Treating Tayson Schnelle/Extender: Lawrence [name] in Treatment: 11 Wound Status Wound Number: 4 Primary Diabetic Wound/Ulcer of the Lower Extremity Etiology: Wound Location: Left T Third oe Wound Open Wounding Event: Gradually Appeared Status: Date Acquired: 12/05/2022 Comorbid Glaucoma, Coronary Artery Disease, Hypertension, Myocardial Weeks Of Treatment: 11 History: Infarction, Type II Diabetes, End  Stage Renal Disease Clustered Wound: No Pending Amputation On Presentation Photos Wound Measurements Length: (cm) 0.4 Width: (cm) 0.5 Depth: (cm) 0.3 Area: (cm) 0.157 Volume: (cm) 0.047 % Reduction in Area: 91.7% % Reduction in Volume: 87.5% Tunneling: No Undermining: No Wound Description Classification: Grade 3 Exudate Amount: Medium Exudate Type: Serosanguineous Exudate Color: red, brown Wound Bed Granulation Amount: Small (1-33%) Exposed Structure Granulation Quality: Pink Fascia Exposed: No Necrotic Amount: Large (67-100%) Fat Layer (Subcutaneous Tissue) Exposed: Yes Necrotic Quality: Adherent Slough Tendon Exposed: No Muscle Exposed: No Joint Exposed: No Bone Exposed: No Treatment Notes Wound #4 (Toe Third) Wound Laterality: Left Cleanser Soap and Water Discharge Instruction: Gently cleanse wound with antibacterial soap, rinse and pat dry prior to dressing wounds TELESFORO, CAMPE ([id number]) [id number] Page 11 of 11 Peri-Wound Care Topical Primary Dressing Silvercel Small 2x2 (in/in) Discharge Instruction: Apply Silvercel Small 2x2 (in/in) as instructed Secondary Dressing Coverlet Latex-Free Fabric Adhesive Dressings Discharge Instruction: 1.5 x 2 Secured With Compression Wrap Compression  Stockings Add-Ons Electronic Signature(s) Signed: 04/17/2023 9:22:01 AM By: Lawrence Pax RN Entered By: Lawrence [name] on 04/17/2023 08:59:50 -------------------------------------------------------------------------------- Vitals Details Patient Name: Date of Service: [name], [name] 04/17/2023 9:00 A M Medical Record Number: [id number] Patient Account Number: [id number] Date of Birth/Sex: Treating RN: 27-Apr-1951 (72 y.o. Lawrence [name]) Lawrence [name] Primary Care Alec Mcphee: Lawrence [name] Other Clinician: Referring Marquin Patino: Treating Gracilyn Gunia/Extender: Lawrence [name] in Treatment: 11 Vital Signs Time Taken: 08:54 Temperature (F): 97.8 Height (in): 70 Pulse (bpm): 71 Weight (lbs): 302 Respiratory Rate (breaths/min): 18 Body Mass Index (BMI): 43.3 Blood Pressure (mmHg): 136/54 Reference Range: 80 - 120 mg / dl Electronic Signature(s) Signed: 04/17/2023 9:22:01 AM By: Lawrence Pax RN Entered By: Lawrence [name] on 04/17/2023 08:55:20

## 2023-04-19 NOTE — Progress Notes (Signed)
Lawrence [name] ([id number]) [id number] Page 1 of 2 Visit Report for 04/17/2023 HBO Details Patient Name: Date of Service: [name], [name] 04/17/2023 10:00 A M Medical Record Number: [id number] Patient Account Number: [id number] Date of Birth/Sex: Treating RN: [birth year] (72 y.o. M) Primary Care Lawrence [name]: Lawrence [name] Other Clinician: Referring Lawrence [name]: Treating Lawrence [name]/Extender: Lawrence [name] in Treatment: 11 HBO Treatment Course Details Treatment Course Number: 1 Ordering Lawrence [name]: Lawrence [name] T Treatments Ordered: otal 40 HBO Treatment Start Date: 02/24/2023 HBO Indication: Other (specify in Notes) HBO Treatment Details Treatment Number: 29 Patient Type: Outpatient Chamber Type: Monoplace Chamber Serial #: [id number] Treatment Protocol: 2.0 ATA with 90 minutes oxygen, and no air breaks Treatment Details Compression Rate Down: 2.0 psi / minute De-Compression Rate Up: 1.5 psi / minute Air breaks and breathing Decompress Decompress Compress Tx Pressure Begins Reached periods Begins Ends (leave unused spaces blank) Chamber Pressure (ATA 1 2 ------2 1 ) Clock Time (24 hr) 10:25 10:35 - - - - - - 12:05 12:15 Treatment Length: 110 (minutes) Treatment Segments: 4 Vital Signs Capillary Blood Glucose Reference Range: 80 - 120 mg / dl HBO Diabetic Blood Glucose Intervention Range: <131 mg/dl or >782 mg/dl Type: Time Vitals Blood Respiratory Capillary Blood Glucose Pulse Action Pulse: Temperature: Taken: Pressure: Rate: Glucose (mg/dl): Meter #: Oximetry (%) Taken: Pre 10:15 136/65 71 18 97.8 164 1 99 none per protocol Post 12:15 130/60 72 18 98 135 1 98 none per protocol Treatment Response Treatment Toleration: Well Treatment Completion Status: Treatment Completed without Adverse Event Electronic Signature(s) Signed: 04/17/2023 12:27:05 PM By: Lawrence [name] Signed: 04/17/2023 1:44:39 PM By: Lawrence Derry PA-C Entered By:  Lawrence [name] on 04/17/2023 12:24:46 [name], Lawrence [name] ([id number]) [id number] Page 2 of 2 -------------------------------------------------------------------------------- HBO Safety Checklist Details Patient Name: Date of Service: [name], [name] 04/17/2023 10:00 A M Medical Record Number: [id number] Patient Account Number: [id number] Date of Birth/Sex: Treating RN: 07/11/1951 (72 y.o. M) Primary Care Lawrence [name]: Lawrence [name] Other Clinician: Referring Lawrence [name]: Treating Lawrence [name]/Extender: Lawrence [name] in Treatment: 11 HBO Safety Checklist Items Safety Checklist Consent Form Signed Patient voided / foley secured and emptied When did you last eato 04/17/23 am Last dose of injectable or oral agent 04/17/23 glucogan Ostomy pouch emptied and vented if applicable NA All implantable devices assessed, documented and approved NA Intravenous access site secured and place NA Valuables secured Linens and cotton and cotton/polyester blend (less than 51% polyester) Personal oil-based products / skin lotions / body lotions removed Wigs or hairpieces removed NA Smoking or tobacco materials removed NA Books / newspapers / magazines / loose paper removed Cologne, aftershave, perfume and deodorant removed Jewelry removed (may wrap wedding band) Make-up removed NA Hair care products removed Battery operated devices (external) removed NA Heating patches and chemical warmers removed NA Titanium eyewear removed NA Nail polish cured greater than 10 hours NA Casting material cured greater than 10 hours NA Hearing aids removed NA Loose dentures or partials removed NA Prosthetics have been removed NA Patient demonstrates correct use of air break device (if applicable) Patient concerns have been addressed Patient grounding bracelet on and cord attached to chamber Specifics for Inpatients (complete in addition to above) Medication sheet sent  with patient Intravenous medications needed or due during therapy sent with patient Drainage tubes (e.g. nasogastric tube or chest tube secured and vented) Endotracheal or Tracheotomy tube secured Cuff deflated of air and inflated with saline Airway suctioned Electronic Signature(s)  Signed: 04/17/2023 12:27:05 PM By: Lawrence [name] Entered By: Lawrence [name] on 04/17/2023 12:24:03

## 2023-04-20 ENCOUNTER — Encounter: Payer: Medicare Other | Admitting: Physician Assistant

## 2023-04-20 DIAGNOSIS — E10621 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: Secondary | ICD-10-CM | POA: Diagnosis not present

## 2023-04-20 LAB — GLUCOSE, CAPILLARY
Glucose-Capillary: 214 mg/dL — ABNORMAL HIGH (ref 70–99)
Glucose-Capillary: 216 mg/dL — ABNORMAL HIGH (ref 70–99)

## 2023-04-20 NOTE — Progress Notes (Signed)
Lawrence [name] ([id number]) [id number] Page 1 of 2 Visit Report for 04/20/2023 Arrival Information Details Patient Name: Date of Service: Lawrence [name], [name] 04/20/2023 1:30 PM Medical Record Number: [id number] Patient Account Number: [id number] Date of Birth/Sex: Treating RN: 03-30-1951 (72 y.o. M) Primary Care Albin Duckett: [name] Other Clinician: Referring Kenesha Moshier: Treating Tanasia Budzinski/Extender: [name] in Treatment: 11 Visit Information History Since Last Visit Added or deleted any medications: No Patient Arrived: Cane Any new allergies or adverse reactions: No Arrival Time: 01:10 Had Lawrence fall or experienced change in No Accompanied By: wife activities of daily living that may affect Transfer Assistance: None risk of falls: Patient Identification Verified: Yes Signs or symptoms of abuse/neglect since last visito No Secondary Verification Process Completed: Yes Hospitalized since last visit: No Patient Requires Transmission-Based Precautions: No Implantable device outside of the clinic excluding No Patient Has Alerts: Yes cellular tissue based products placed in the center since last visit: Pain Present Now: No Electronic Signature(s) Signed: 04/20/2023 3:28:58 PM By: [name] Entered By: [name] on 04/20/2023 15:00:35 -------------------------------------------------------------------------------- Encounter Discharge Information Details Patient Name: Date of Service: Lawrence [name], [name] 04/20/2023 1:30 PM Medical Record Number: [id number] Patient Account Number: [id number] Date of Birth/Sex: Treating RN: [birth year] (72 y.o. M) Primary Care Lenardo Westwood: [name] Other Clinician: Referring Keshara Kiger: Treating Blayden Conwell/Extender: [name] in Treatment: 11 Encounter Discharge Information Items Discharge Condition: Stable Ambulatory Status: Ambulatory Discharge Destination:  Home Transportation: Private Auto Accompanied By: self Schedule Follow-up Appointment: Yes Clinical Summary of Care: SHYAN, DIECKHOFF ([id number]) [id number] Page 2 of 2 Electronic Signature(s) Signed: 04/20/2023 3:28:58 PM By: [name] Entered By: [name] on 04/20/2023 15:28:39 -------------------------------------------------------------------------------- Vitals Details Patient Name: Date of Service: Lawrence [name], [name] 04/20/2023 1:30 PM Medical Record Number: [id number] Patient Account Number: [id number] Date of Birth/Sex: Treating RN: 1951-07-23 (72 y.o. M) Primary Care Serinity Ware: [name] Other Clinician: Referring Briona Korpela: Treating Keylin Podolsky/Extender: [name] in Treatment: 11 Vital Signs Time Taken: 01:20 Temperature (F): 98.0 Height (in): 70 Pulse (bpm): 76 Weight (lbs): 302 Respiratory Rate (breaths/min): 18 Body Mass Index (BMI): 43.3 Blood Pressure (mmHg): 130/60 Capillary Blood Glucose (mg/dl): 244 Reference Range: 80 - 120 mg / dl Airway Pulse Oximetry (%): 99 Electronic Signature(s) Signed: 04/20/2023 3:28:58 PM By: [name] Entered By: [name] on 04/20/2023 15:00:38

## 2023-04-20 NOTE — Progress Notes (Signed)
Lawrence [name] ([id number]) [id number] Page 1 of 7 Visit Report for 04/20/2023 Arrival Information Details Patient Name: Date of Service: Lawrence [name], [name] 04/20/2023 12:30 PM Medical Record Number: [id number] Patient Account Number: [id number] Date of Birth/Sex: Treating RN: 08-29-1951 (72 y.o. Lawrence [name]) [name] Primary Care Jedediah Noda: [name] Other Clinician: Referring Lache Dagher: Treating Gerldine Suleiman/Extender: [name] in Treatment: 11 Visit Information History Since Last Visit Added or deleted any medications: No Patient Arrived: Ambulatory Any new allergies or adverse reactions: No Arrival Time: 12:30 Had Lawrence fall or experienced change in No Accompanied By: self activities of daily living that may affect Transfer Assistance: None risk of falls: Patient Identification Verified: Yes Signs or symptoms of abuse/neglect since last visito No Secondary Verification Process Completed: Yes Hospitalized since last visit: No Patient Requires Transmission-Based Precautions: No Implantable device outside of the clinic excluding No Patient Has Alerts: Yes cellular tissue based products placed in the center since last visit: Has Dressing in Place as Prescribed: Yes Has Compression in Place as Prescribed: No Pain Present Now: No Electronic Signature(s) Signed: 04/20/2023 3:07:16 PM By: [name] RN Entered By: [name] on 04/20/2023 15:07:16 -------------------------------------------------------------------------------- Clinic Level of Care Assessment Details Patient Name: Date of Service: Lawrence [name], [name] 04/20/2023 12:30 PM Medical Record Number: [id number] Patient Account Number: [id number] Date of Birth/Sex: Treating RN: 1950/12/04 (72 y.o. Lawrence [name]) [name] Primary Care Jeremy Mclamb: [name] Other Clinician: Referring Saud Bail: Treating Yeraldine Forney/Extender: [name] in Treatment:  11 Clinic Level of Care Assessment Items TOOL 4 Quantity Score X- 1 0 Use when only an EandM is performed on FOLLOW-UP visit ASSESSMENTS - Nursing Assessment / Reassessment X- 1 10 Reassessment of Co-morbidities (includes updates in patient status) X- 1 5 Reassessment of Adherence to Treatment Plan Lawrence [name], Lawrence [name] ([id number]) [id number] Page 2 of 7 ASSESSMENTS - Wound and Skin Lawrence ssessment / Reassessment []  - 0 Simple Wound Assessment / Reassessment - one wound X- 3 5 Complex Wound Assessment / Reassessment - multiple wounds []  - 0 Dermatologic / Skin Assessment (not related to wound area) ASSESSMENTS - Focused Assessment []  - 0 Circumferential Edema Measurements - multi extremities []  - 0 Nutritional Assessment / Counseling / Intervention []  - 0 Lower Extremity Assessment (monofilament, tuning fork, pulses) []  - 0 Peripheral Arterial Disease Assessment (using hand held doppler) ASSESSMENTS - Ostomy and/or Continence Assessment and Care []  - 0 Incontinence Assessment and Management []  - 0 Ostomy Care Assessment and Management (repouching, etc.) PROCESS - Coordination of Care X - Simple Patient / Family Education for ongoing care 1 15 []  - 0 Complex (extensive) Patient / Family Education for ongoing care []  - 0 Staff obtains Chiropractor, Records, T Results / Process Orders est []  - 0 Staff telephones HHA, Nursing Homes / Clarify orders / etc []  - 0 Routine Transfer to another Facility (non-emergent condition) []  - 0 Routine Hospital Admission (non-emergent condition) []  - 0 New Admissions / Manufacturing engineer / Ordering NPWT Apligraf, etc. , []  - 0 Emergency Hospital Admission (emergent condition) X- 1 10 Simple Discharge Coordination []  - 0 Complex (extensive) Discharge Coordination PROCESS - Special Needs []  - 0 Pediatric / Minor Patient Management []  - 0 Isolation Patient Management []  - 0 Hearing / Language / Visual special  needs []  - 0 Assessment of Community assistance (transportation, D/C planning, etc.) []  - 0 Additional assistance / Altered mentation []  - 0 Support Surface(s) Assessment (bed, cushion, seat, etc.)  INTERVENTIONS - Wound Cleansing / Measurement []  - 0 Simple Wound Cleansing - one wound X- 3 5 Complex Wound Cleansing - multiple wounds []  - 0 Wound Imaging (photographs - any number of wounds) []  - 0 Wound Tracing (instead of photographs) []  - 0 Simple Wound Measurement - one wound X- 3 5 Complex Wound Measurement - multiple wounds INTERVENTIONS - Wound Dressings X - Small Wound Dressing one or multiple wounds 3 10 []  - 0 Medium Wound Dressing one or multiple wounds []  - 0 Large Wound Dressing one or multiple wounds []  - 0 Application of Medications - topical []  - 0 Application of Medications - injection INTERVENTIONS - Miscellaneous []  - 0 External ear exam BRENDIN, [name] ([id number]) [id number] Page 3 of 7 []  - 0 Specimen Collection (cultures, biopsies, blood, body fluids, etc.) []  - 0 Specimen(s) / Culture(s) sent or taken to Lab for analysis []  - 0 Patient Transfer (multiple staff / Michiel Sites Lift / Similar devices) []  - 0 Simple Staple / Suture removal (25 or less) []  - 0 Complex Staple / Suture removal (26 or more) []  - 0 Hypo / Hyperglycemic Management (close monitor of Blood Glucose) []  - 0 Ankle / Brachial Index (ABI) - do not check if billed separately X- 1 5 Vital Signs Has the patient been seen at the hospital within the last three years: Yes Total Score: 120 Level Of Care: New/Established - Level 4 Electronic Signature(s) Unsigned Entered ByYevonne Pax on 04/20/2023 15:10:19 -------------------------------------------------------------------------------- Encounter Discharge Information Details Patient Name: Date of Service: Lawrence [name], [name] 04/20/2023 12:30 PM Medical Record Number: [id number] Patient Account Number:  [id number] Date of Birth/Sex: Treating RN: Feb 28, 1951 (72 y.o. Lawrence [name]) [name] Primary Care Auther Lyerly: [name] Other Clinician: Referring Javaya [place]: Treating Yoshiye Kraft/Extender: [name] in Treatment: 11 Encounter Discharge Information Items Discharge Condition: Stable Ambulatory Status: Cane Discharge Destination: Home Transportation: Private Auto Accompanied By: self Schedule Follow-up Appointment: Yes Clinical Summary of Care: Electronic Signature(s) Signed: 04/20/2023 3:09:44 PM By: [name] RN Entered By: [name] on 04/20/2023 15:09:44 Lawrence [name], Lawrence [name] ([id number]) [id number] Page 4 of 7 -------------------------------------------------------------------------------- Wound Assessment Details Patient Name: Date of Service: Lawrence [name], [name] 04/20/2023 12:30 PM Medical Record Number: [id number] Patient Account Number: [id number] Date of Birth/Sex: Treating RN: 09-Jan-1951 (72 y.o. Lawrence [name]) [name] Primary Care Gustavus Haskin: [name] Other Clinician: Referring Dave Mannes: Treating Larayah Clute/Extender: [name] in Treatment: 11 Wound Status Wound Number: 1 Primary Diabetic Wound/Ulcer of the Lower Extremity Etiology: Wound Location: Right T Second oe Wound Open Wounding Event: Gradually Appeared Status: Date Acquired: 11/03/2022 Comorbid Glaucoma, Coronary Artery Disease, Hypertension, Myocardial Weeks Of Treatment: 11 History: Infarction, Type II Diabetes, End Stage Renal Disease Clustered Wound: No Pending Amputation On Presentation Wound Measurements Length: (cm) 0.6 Width: (cm) 0.5 Depth: (cm) 0.1 Area: (cm) 0.236 Volume: (cm) 0.024 % Reduction in Area: 69.9% % Reduction in Volume: 89.8% Epithelialization: None Tunneling: No Undermining: No Wound Description Classification: Grade 3 Wound Margin: Distinct, outline attached Exudate Amount: Medium Exudate Type:  Serosanguineous Exudate Color: red, brown Foul Odor After Cleansing: No Slough/Fibrino Yes Wound Bed Granulation Amount: Small (1-33%) Exposed Structure Granulation Quality: Pink Fat Layer (Subcutaneous Tissue) Exposed: No Necrotic Amount: Large (67-100%) Bone Exposed: Yes Necrotic Quality: Adherent Slough Treatment Notes Wound #1 (Toe Second) Wound Laterality: Right Cleanser Soap and Water Discharge Instruction: Gently cleanse wound with antibacterial soap, rinse and pat dry prior to dressing wounds Peri-Wound Care  Topical Primary Dressing Silvercel Small 2x2 (in/in) Discharge Instruction: Apply Silvercel Small 2x2 (in/in) as instructed Secondary Dressing Coverlet Latex-Free Fabric Adhesive Dressings Discharge Instruction: 1.5 x 2 Secured With Compression Wrap Compression Stockings Add-Ons Electronic Signature(s) Signed: 04/20/2023 3:07:53 PM By: [name] RN Entered By: [name] on 04/20/2023 15:07:52 Stennis, Lawrence [name] ([id number]) [id number] Page 5 of 7 -------------------------------------------------------------------------------- Wound Assessment Details Patient Name: Date of Service: Lawrence [name], [name] 04/20/2023 12:30 PM Medical Record Number: [id number] Patient Account Number: [id number] Date of Birth/Sex: Treating RN: 1951-06-22 (72 y.o. Lawrence [name]) [name] Primary Care Naima Veldhuizen: [name] Other Clinician: Referring Nolah Krenzer: Treating Lekeith Wulf/Extender: [name] in Treatment: 11 Wound Status Wound Number: 2 Primary Diabetic Wound/Ulcer of the Lower Extremity Etiology: Wound Location: Right T Third oe Wound Open Wounding Event: Gradually Appeared Status: Date Acquired: 11/03/2022 Comorbid Glaucoma, Coronary Artery Disease, Hypertension, Myocardial Weeks Of Treatment: 11 History: Infarction, Type II Diabetes, End Stage Renal Disease Clustered Wound: No Pending Amputation On Presentation Wound  Measurements Length: (cm) 0.4 Width: (cm) 0.5 Depth: (cm) 0.1 Area: (cm) 0.157 Volume: (cm) 0.016 % Reduction in Area: 66.7% % Reduction in Volume: 66% Epithelialization: None Tunneling: No Undermining: No Wound Description Classification: Grade 3 Wound Margin: Distinct, outline attached Exudate Amount: Medium Exudate Type: Serosanguineous Exudate Color: red, brown Foul Odor After Cleansing: No Slough/Fibrino Yes Wound Bed Granulation Amount: None Present (0%) Exposed Structure Necrotic Amount: Large (67-100%) Fascia Exposed: No Necrotic Quality: Adherent Slough Fat Layer (Subcutaneous Tissue) Exposed: Yes Tendon Exposed: No Muscle Exposed: No Joint Exposed: No Bone Exposed: No Treatment Notes Wound #2 (Toe Third) Wound Laterality: Right Cleanser Soap and Water Discharge Instruction: Gently cleanse wound with antibacterial soap, rinse and pat dry prior to dressing wounds Peri-Wound Care Topical Primary Dressing Silvercel Small 2x2 (in/in) Discharge Instruction: Apply Silvercel Small 2x2 (in/in) as instructed Secondary Dressing Coverlet Latex-Free Fabric Adhesive Dressings Discharge Instruction: 1.5 x 2 Secured With Lawrence [name] ([id number]) [id number] Page 6 of 7 Compression Wrap Compression Stockings Add-Ons Electronic Signature(s) Signed: 04/20/2023 3:08:00 PM By: [name] RN Entered By: [name] on 04/20/2023 15:08:00 -------------------------------------------------------------------------------- Wound Assessment Details Patient Name: Date of Service: Lawrence [name], [name] 04/20/2023 12:30 PM Medical Record Number: [id number] Patient Account Number: [id number] Date of Birth/Sex: Treating RN: [birth year] (72 y.o. Lawrence [name]) [name] Primary Care Kadan Millstein: [name] Other Clinician: Referring Arriona Prest: Treating Shakoya Gilmore/Extender: [name] in Treatment: 11 Wound Status Wound Number: 4 Primary  Diabetic Wound/Ulcer of the Lower Extremity Etiology: Wound Location: Left T Third oe Wound Open Wounding Event: Gradually Appeared Status: Date Acquired: 12/05/2022 Comorbid Glaucoma, Coronary Artery Disease, Hypertension, Myocardial Weeks Of Treatment: 11 History: Infarction, Type II Diabetes, End Stage Renal Disease Clustered Wound: No Pending Amputation On Presentation Wound Measurements Length: (cm) 0.4 Width: (cm) 0.5 Depth: (cm) 0.3 Area: (cm) 0.157 Volume: (cm) 0.047 % Reduction in Area: 91.7% % Reduction in Volume: 87.5% Tunneling: No Undermining: No Wound Description Classification: Grade 3 Exudate Amount: Medium Exudate Type: Serosanguineous Exudate Color: red, brown Wound Bed Granulation Amount: Small (1-33%) Exposed Structure Granulation Quality: Pink Fascia Exposed: No Necrotic Amount: Large (67-100%) Fat Layer (Subcutaneous Tissue) Exposed: Yes Necrotic Quality: Adherent Slough Tendon Exposed: No Muscle Exposed: No Joint Exposed: No Bone Exposed: No Treatment Notes Wound #4 (Toe Third) Wound Laterality: Left Cleanser Soap and Water Discharge Instruction: Gently cleanse wound with antibacterial soap, rinse and pat dry prior to dressing wounds Peri-Wound Care Topical Elbe, Roanna Epley W (  [id number]) [id number] Page 7 of 7 Primary Dressing Silvercel Small 2x2 (in/in) Discharge Instruction: Apply Silvercel Small 2x2 (in/in) as instructed Secondary Dressing Coverlet Latex-Free Fabric Adhesive Dressings Discharge Instruction: 1.5 x 2 Secured With Compression Wrap Compression Stockings Add-Ons Electronic Signature(s) Signed: 04/20/2023 3:08:14 PM By: [name] RN Entered By: [name] on 04/20/2023 15:08:13

## 2023-04-20 NOTE — Progress Notes (Signed)
[name] ([id number]) [id number] Page 1 of 2 Visit Report for 04/20/2023 HBO Details Patient Name: Date of Service: [name], [name] 04/20/2023 1:30 PM Medical Record Number: [id number] Patient Account Number: [id number] Date of Birth/Sex: Treating RN: April 27, 1951 (72 y.o. M) Primary Care Franny Selvage: [name] Other Clinician: Referring Aydenn Gervin: Treating Keondre Markson/Extender: [name] in Treatment: 11 HBO Treatment Course Details Treatment Course Number: 1 Ordering Ravneet Spilker: [name] T Treatments Ordered: otal 40 HBO Treatment Start Date: 02/24/2023 HBO Indication: Other (specify in Notes) HBO Treatment Details Treatment Number: 30 Patient Type: Outpatient Chamber Type: Monoplace Chamber Serial #: [id number] Treatment Protocol: 2.0 ATA with 90 minutes oxygen, and no air breaks Treatment Details Compression Rate Down: 2.0 psi / minute De-Compression Rate Up: 1.5 psi / minute Air breaks and breathing Decompress Decompress Compress Tx Pressure Begins Reached periods Begins Ends (leave unused spaces blank) Chamber Pressure (ATA 1 2 ------2 1 ) Clock Time (24 hr) 13:23 13:33 - - - - - - 15:03 15:13 Treatment Length: 110 (minutes) Treatment Segments: 4 Vital Signs Capillary Blood Glucose Reference Range: 80 - 120 mg / dl HBO Diabetic Blood Glucose Intervention Range: <131 mg/dl or >784 mg/dl Time Vitals Blood Respiratory Capillary Blood Glucose Pulse Action Type: Pulse: Temperature: Taken: Pressure: Rate: Glucose (mg/dl): Meter #: Oximetry (%) Taken: Pre 01:20 130/60 76 18 98 214 99 Post 15:13 118/68 74 18 98 Treatment Response Treatment Toleration: Well Treatment Completion Status: Treatment Completed without Adverse Event Electronic Signature(s) Signed: 04/20/2023 3:28:58 PM By: [name] Signed: 04/20/2023 3:37:18 PM By: [name] Entered By: [name] on 04/20/2023 15:28:11 Masi, [name]  ([id number]) [id number] Page 2 of 2 -------------------------------------------------------------------------------- HBO Safety Checklist Details Patient Name: Date of Service: [name], [name] 04/20/2023 1:30 PM Medical Record Number: [id number] Patient Account Number: [id number] Date of Birth/Sex: Treating RN: Jan 29, 1951 (72 y.o. M) Primary Care Chanae Gemma: [name] Other Clinician: Referring Sterlin Knightly: Treating Deajah Erkkila/Extender: [name] in Treatment: 11 HBO Safety Checklist Items Safety Checklist Consent Form Signed Patient voided / foley secured and emptied When did you last eato 04/20/2023 Last dose of injectable or oral agent 04/20/23 glucogan Ostomy pouch emptied and vented if applicable NA All implantable devices assessed, documented and approved NA Intravenous access site secured and place NA Valuables secured Linens and cotton and cotton/polyester blend (less than 51% polyester) Personal oil-based products / skin lotions / body lotions removed Wigs or hairpieces removed NA Smoking or tobacco materials removed NA Books / newspapers / magazines / loose paper removed Cologne, aftershave, perfume and deodorant removed Jewelry removed (may wrap wedding band) Make-up removed NA Hair care products removed Battery operated devices (external) removed NA Heating patches and chemical warmers removed NA Titanium eyewear removed NA Nail polish cured greater than 10 hours NA Casting material cured greater than 10 hours NA Hearing aids removed NA Loose dentures or partials removed NA Prosthetics have been removed NA Patient demonstrates correct use of air break device (if applicable) Patient concerns have been addressed Patient grounding bracelet on and cord attached to chamber Specifics for Inpatients (complete in addition to above) Medication sheet sent with patient NA Intravenous medications needed or due  during therapy sent with patient NA Drainage tubes (e.g. nasogastric tube or chest tube secured and vented) NA Endotracheal or Tracheotomy tube secured NA Cuff deflated of air and inflated with saline NA Airway suctioned NA Electronic Signature(s) Signed: 04/20/2023 3:28:58 PM By: [name]  Entered By: [name] on 04/20/2023 15:00:41

## 2023-04-21 ENCOUNTER — Encounter: Payer: Medicare Other | Admitting: Physician Assistant

## 2023-04-21 DIAGNOSIS — E10621 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: Secondary | ICD-10-CM | POA: Diagnosis not present

## 2023-04-21 LAB — GLUCOSE, CAPILLARY
Glucose-Capillary: 279 mg/dL — ABNORMAL HIGH (ref 70–99)
Glucose-Capillary: 242 mg/dL — ABNORMAL HIGH (ref 70–99)

## 2023-04-21 NOTE — Progress Notes (Signed)
[name] ([id number]) [id number] Page 1 of 2 Visit Report for 04/21/2023 Arrival Information Details Patient Name: Date of Service: [name], [name] 04/21/2023 1:30 PM Medical Record Number: [id number] Patient Account Number: [id number] Date of Birth/Sex: Treating RN: Apr 03, 1951 (72 y.o. M) Primary Care Aura Bibby: [name] Other Clinician: Referring Trinnity Breunig: Treating Titania Gault/Extender: [name] in Treatment: 11 Visit Information History Since Last Visit Added or deleted any medications: No Patient Arrived: Cane Any new allergies or adverse reactions: No Arrival Time: 01:30 Had a fall or experienced change in No Accompanied By: self activities of daily living that may affect Transfer Assistance: None risk of falls: Patient Identification Verified: Yes Signs or symptoms of abuse/neglect since last visito No Secondary Verification Process Completed: Yes Hospitalized since last visit: No Patient Requires Transmission-Based Precautions: No Implantable device outside of the clinic excluding No Patient Has Alerts: No cellular tissue based products placed in the center since last visit: Pain Present Now: No Electronic Signature(s) Signed: 04/21/2023 3:49:47 PM By: [name] Entered By: [name] on 04/21/2023 15:45:53 -------------------------------------------------------------------------------- Encounter Discharge Information Details Patient Name: Date of Service: [name], [name] 04/21/2023 1:30 PM Medical Record Number: [id number] Patient Account Number: [id number] Date of Birth/Sex: Treating RN: 05-Dec-1950 (72 y.o. M) Primary Care Trong Gosling: [name] Other Clinician: Referring Riese Hellard: Treating Jasmon Graffam/Extender: [name] in Treatment: 11 Encounter Discharge Information Items Discharge Condition: Stable Ambulatory Status: Cane Discharge Destination:  Home Transportation: Private Auto Accompanied By: self Schedule Follow-up Appointment: Yes Clinical Summary of Care: YANN, LORENTZEN ([id number]) [id number] Page 2 of 2 Electronic Signature(s) Signed: 04/21/2023 3:49:47 PM By: [name] Entered By: [name] on 04/21/2023 15:49:23 -------------------------------------------------------------------------------- Vitals Details Patient Name: Date of Service: [name], [name] 04/21/2023 1:30 PM Medical Record Number: [id number] Patient Account Number: [id number] Date of Birth/Sex: Treating RN: 03-17-1951 (72 y.o. M) Primary Care Medha Pippen: [name] Other Clinician: Referring Antwoine Zorn: Treating Kindal Ponti/Extender: [name] in Treatment: 11 Vital Signs Time Taken: 01:30 Temperature (F): 97.9 Height (in): 70 Pulse (bpm): 78 Weight (lbs): 302 Respiratory Rate (breaths/min): 18 Body Mass Index (BMI): 43.3 Blood Pressure (mmHg): 136/58 Capillary Blood Glucose (mg/dl): 433 Reference Range: 80 - 120 mg / dl Airway Pulse Oximetry (%): 96 Electronic Signature(s) Signed: 04/21/2023 3:49:47 PM By: [name] Entered By: [name] on 04/21/2023 15:45:56

## 2023-04-22 ENCOUNTER — Encounter (HOSPITAL_BASED_OUTPATIENT_CLINIC_OR_DEPARTMENT_OTHER): Payer: Medicare Other | Admitting: Internal Medicine

## 2023-04-22 DIAGNOSIS — E10621 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: Secondary | ICD-10-CM

## 2023-04-22 DIAGNOSIS — M86371 Chronic multifocal osteomyelitis, right ankle and foot: Secondary | ICD-10-CM

## 2023-04-22 LAB — GLUCOSE, CAPILLARY
Glucose-Capillary: 154 mg/dL — ABNORMAL HIGH (ref 70–99)
Glucose-Capillary: 162 mg/dL — ABNORMAL HIGH (ref 70–99)

## 2023-04-22 NOTE — Progress Notes (Signed)
[name] ([id number]) [id number] Page 1 of 2 Visit Report for 04/22/2023 Arrival Information Details Patient Name: Date of Service: [name], [name] 04/22/2023 1:30 PM Medical Record Number: [id number] Patient Account Number: [id number] Date of Birth/Sex: Treating RN: Mar 28, 1951 (72 y.o. M) Primary Care Harmonii Karle: [name] Other Clinician: Referring Hezzie Karim: Treating Darci Lykins/Extender: Emilee Hero in Treatment: 11 Visit Information History Since Last Visit Added or deleted any medications: No Patient Arrived: Cane Any new allergies or adverse reactions: No Arrival Time: 14:31 Had a fall or experienced change in No Accompanied By: self activities of daily living that may affect Transfer Assistance: None risk of falls: Patient Identification Verified: Yes Signs or symptoms of abuse/neglect since last visito No Secondary Verification Process Completed: Yes Hospitalized since last visit: No Patient Requires Transmission-Based Precautions: No Implantable device outside of the clinic excluding No Patient Has Alerts: No cellular tissue based products placed in the center since last visit: Pain Present Now: No Electronic Signature(s) Signed: 04/22/2023 4:00:06 PM By: [name] Entered By: [name] on 04/22/2023 15:33:40 -------------------------------------------------------------------------------- Encounter Discharge Information Details Patient Name: Date of Service: [name], [name] 04/22/2023 1:30 PM Medical Record Number: [id number] Patient Account Number: [id number] Date of Birth/Sex: Treating RN: 1951/02/17 (72 y.o. M) Primary Care Demetra Moya: [name] Other Clinician: Referring Jep Dyas: Treating Ritter Helsley/Extender: Emilee Hero in Treatment: 11 Encounter Discharge Information Items Discharge Condition: Stable Ambulatory Status: Cane Discharge Destination:  Home Transportation: Private Auto Accompanied By: self Schedule Follow-up Appointment: Yes Clinical Summary of Care: PAYAM, ZERO ([id number]) [id number] Page 2 of 2 Electronic Signature(s) Signed: 04/22/2023 4:00:06 PM By: [name] Entered By: [name] on 04/22/2023 15:40:09 -------------------------------------------------------------------------------- Vitals Details Patient Name: Date of Service: [name], [name] 04/22/2023 1:30 PM Medical Record Number: [id number] Patient Account Number: [id number] Date of Birth/Sex: Treating RN: 29-Jun-1951 (72 y.o. M) Primary Care Nikai Quest: [name] Other Clinician: Referring Ron Junco: Treating Luanne Krzyzanowski/Extender: Emilee Hero in Treatment: 11 Vital Signs Time Taken: 01:30 Temperature (F): 97.9 Height (in): 70 Pulse (bpm): 73 Weight (lbs): 302 Respiratory Rate (breaths/min): 18 Body Mass Index (BMI): 43.3 Blood Pressure (mmHg): 130/68 Capillary Blood Glucose (mg/dl): 536 Reference Range: 80 - 120 mg / dl Airway Pulse Oximetry (%): 99 Electronic Signature(s) Signed: 04/22/2023 4:00:06 PM By: [name] Entered By: [name] on 04/22/2023 15:33:42

## 2023-04-23 ENCOUNTER — Encounter: Payer: Medicare Other | Admitting: Physician Assistant

## 2023-04-23 DIAGNOSIS — E10621 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: Secondary | ICD-10-CM | POA: Diagnosis not present

## 2023-04-23 LAB — GLUCOSE, CAPILLARY
Glucose-Capillary: 206 mg/dL — ABNORMAL HIGH (ref 70–99)
Glucose-Capillary: 184 mg/dL — ABNORMAL HIGH (ref 70–99)

## 2023-04-23 NOTE — Progress Notes (Signed)
Lawrence [name] ([id number]) [id number] Page 1 of 2 Visit Report for 04/23/2023 Arrival Information Details Patient Name: Date of Service: Lawrence [name], [name] 04/23/2023 1:30 PM Medical Record Number: [id number] Patient Account Number: [id number] Date of Birth/Sex: Treating RN: [birth year] (73 y.o. M) Primary Care Aubriel Khanna: [name] Other Clinician: Referring Madelina Sanda: Treating Cornelio Parkerson/Extender: [name] in Treatment: 11 Visit Information History Since Last Visit Added or deleted any medications: No Patient Arrived: Cane Any new allergies or adverse reactions: No Arrival Time: 12:45 Had Lawrence fall or experienced change in No Accompanied By: wife activities of daily living that may affect Transfer Assistance: None risk of falls: Patient Identification Verified: Yes Signs or symptoms of abuse/neglect since last visito No Secondary Verification Process Completed: Yes Hospitalized since last visit: No Patient Requires Transmission-Based Precautions: No Implantable device outside of the clinic excluding No Patient Has Alerts: No cellular tissue based products placed in the center since last visit: Pain Present Now: No Electronic Signature(s) Signed: 04/23/2023 3:13:38 PM By: [name] Entered By: [name] on 04/23/2023 15:07:25 -------------------------------------------------------------------------------- Encounter Discharge Information Details Patient Name: Date of Service: Lawrence [name], [name] 04/23/2023 1:30 PM Medical Record Number: [id number] Patient Account Number: [id number] Date of Birth/Sex: Treating RN: Nov 06, 1950 (72 y.o. M) Primary Care Matteo Banke: [name] Other Clinician: Referring Lasonya Hubner: Treating Miley Blanchett/Extender: [name] in Treatment: 11 Encounter Discharge Information Items Discharge Condition: Stable Ambulatory Status: Cane Discharge Destination:  Home Transportation: Private Auto Accompanied By: self Schedule Follow-up Appointment: Yes Clinical Summary of Care: Lawrence [name] ([id number]) [id number] Page 2 of 2 Electronic Signature(s) Signed: 04/23/2023 3:13:38 PM By: [name] Entered By: [name] on 04/23/2023 15:11:40 -------------------------------------------------------------------------------- Vitals Details Patient Name: Date of Service: Lawrence [name], [name] 04/23/2023 1:30 PM Medical Record Number: [id number] Patient Account Number: [id number] Date of Birth/Sex: Treating RN: May 15, 1951 (72 y.o. M) Primary Care Naresh Althaus: [name] Other Clinician: Referring Mattix Imhof: Treating Zerick Prevette/Extender: [name] in Treatment: 11 Vital Signs Time Taken: 12:45 Temperature (F): 97.9 Height (in): 70 Pulse (bpm): 77 Weight (lbs): 302 Respiratory Rate (breaths/min): 18 Body Mass Index (BMI): 43.3 Blood Pressure (mmHg): 140/68 Capillary Blood Glucose (mg/dl): 244 Reference Range: 80 - 120 mg / dl Airway Pulse Oximetry (%): 98 Electronic Signature(s) Signed: 04/23/2023 3:13:38 PM By: [name] Entered By: [name] on 04/23/2023 15:08:10

## 2023-04-23 NOTE — Progress Notes (Signed)
[name] ([id number]) [id number] Page 1 of 2 Visit Report for 04/22/2023 HBO Details Patient Name: Date of Service: [name], [name] 04/22/2023 1:30 PM Medical Record Number: [id number] Patient Account Number: [id number] Date of Birth/Sex: Treating RN: 25-Apr-1951 (72 y.o. M) Primary Care Jamon Hayhurst: [name] Other Clinician: Referring Leaner Morici: Treating Dason Mosley/Extender: Emilee Hero in Treatment: 11 HBO Treatment Course Details Treatment Course Number: 1 Ordering Mccormick Macon: [name] T Treatments Ordered: otal 40 HBO Treatment Start Date: 02/24/2023 HBO Indication: Other (specify in Notes) HBO Treatment Details Treatment Number: 32 Patient Type: Outpatient Chamber Type: Monoplace Chamber Serial #: [id number] Treatment Protocol: 2.0 ATA with 90 minutes oxygen, and no air breaks Treatment Details Compression Rate Down: 2.0 psi / minute De-Compression Rate Up: 1.5 psi / minute Air breaks and breathing Decompress Decompress Compress Tx Pressure Begins Reached periods Begins Ends (leave unused spaces blank) Chamber Pressure (ATA 1 2 ------2 1 ) Clock Time (24 hr) 13:32 13:43 - - - - - - 15:12 15:23 Treatment Length: 111 (minutes) Treatment Segments: 4 Vital Signs Capillary Blood Glucose Reference Range: 80 - 120 mg / dl HBO Diabetic Blood Glucose Intervention Range: <131 mg/dl or >086 mg/dl Type: Time Vitals Blood Respiratory Capillary Blood Glucose Pulse Action Pulse: Temperature: Taken: Pressure: Rate: Glucose (mg/dl): Meter #: Oximetry (%) Taken: Pre 01:30 130/68 73 18 97.9 154 1 99 none per protocol Post 15:23 124/68 70 18 98 162 1 99 none per protocol Treatment Response Treatment Toleration: Well Treatment Completion Status: Treatment Completed without Adverse Event HBO Attestation I certify that I supervised this HBO treatment in accordance with Medicare guidelines. A trained emergency response  team is readily available per Yes hospital policies and procedures. Continue HBOT as ordered. Yes Electronic Signature(s) Signed: 04/23/2023 12:49:28 PM By: Geralyn Corwin DO Previous Signature: 04/22/2023 4:00:06 PM Version By: [name] Entered By: Geralyn Corwin on 04/23/2023 11:55:19 Ensey, [name] ([id number]) [id number] Page 2 of 2 -------------------------------------------------------------------------------- HBO Safety Checklist Details Patient Name: Date of Service: [name], [name] 04/22/2023 1:30 PM Medical Record Number: [id number] Patient Account Number: [id number] Date of Birth/Sex: Treating RN: 1951/06/27 (72 y.o. M) Primary Care Zylah Elsbernd: [name] Other Clinician: Referring Roxas Clymer: Treating Rommie Dunn/Extender: Emilee Hero in Treatment: 11 HBO Safety Checklist Items Safety Checklist Consent Form Signed Patient voided / foley secured and emptied When did you last eato 04/22/23 Last dose of injectable or oral agent 04/22/23 glucogan Ostomy pouch emptied and vented if applicable NA All implantable devices assessed, documented and approved NA Intravenous access site secured and place NA Valuables secured Linens and cotton and cotton/polyester blend (less than 51% polyester) Personal oil-based products / skin lotions / body lotions removed Wigs or hairpieces removed NA Smoking or tobacco materials removed NA Books / newspapers / magazines / loose paper removed Cologne, aftershave, perfume and deodorant removed Jewelry removed (may wrap wedding band) Make-up removed NA Hair care products removed Battery operated devices (external) removed NA Heating patches and chemical warmers removed NA Titanium eyewear removed NA Nail polish cured greater than 10 hours NA Casting material cured greater than 10 hours NA Hearing aids removed NA Loose dentures or partials removed NA Prosthetics have  been removed NA Patient demonstrates correct use of air break device (if applicable) Patient concerns have been addressed Patient grounding bracelet on and cord attached to chamber Specifics for Inpatients (complete in addition to above) Medication sheet sent with patient Intravenous medications needed or due  during therapy sent with patient Drainage tubes (e.g. nasogastric tube or chest tube secured and vented) Endotracheal or Tracheotomy tube secured Cuff deflated of air and inflated with saline Airway suctioned Electronic Signature(s) Signed: 04/22/2023 4:00:06 PM By: [name] Entered By: [name] on 04/22/2023 15:33:45

## 2023-04-24 ENCOUNTER — Encounter: Payer: Medicare Other | Admitting: Physician Assistant

## 2023-04-24 DIAGNOSIS — E10621 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: Secondary | ICD-10-CM | POA: Diagnosis not present

## 2023-04-24 LAB — GLUCOSE, CAPILLARY
Glucose-Capillary: 297 mg/dL — ABNORMAL HIGH (ref 70–99)
Glucose-Capillary: 291 mg/dL — ABNORMAL HIGH (ref 70–99)

## 2023-04-24 NOTE — Progress Notes (Signed)
Lawrence [name] ([id number]) [id number] Page 1 of 2 Visit Report for 04/21/2023 HBO Details Patient Name: Date of Service: [name], [name] 04/21/2023 1:30 PM Medical Record Number: [id number] Patient Account Number: [id number] Date of Birth/Sex: Treating RN: 1951/09/24 (72 y.o. M) Primary Care Annjanette Wertenberger: [name] Other Clinician: Referring Ziare Orrick: Treating Makynzee Tigges/Extender: [name] in Treatment: 11 HBO Treatment Course Details Treatment Course Number: 1 Ordering Marice Angelino: [name] T Treatments Ordered: otal 40 HBO Treatment Start Date: 02/24/2023 HBO Indication: Other (specify in Notes) HBO Treatment Details Treatment Number: 31 Patient Type: Outpatient Chamber Type: Monoplace Chamber Serial #: [id number] Treatment Protocol: 2.0 ATA with 90 minutes oxygen, and no air breaks Treatment Details Compression Rate Down: 2.0 psi / minute De-Compression Rate Up: 1.5 psi / minute Air breaks and breathing Decompress Decompress Compress Tx Pressure Begins Reached periods Begins Ends (leave unused spaces blank) Chamber Pressure (ATA 1 2 ------2 1 ) Clock Time (24 hr) 13:32 13:43 - - - - - - 15:14 15:24 Treatment Length: 112 (minutes) Treatment Segments: 4 Vital Signs Capillary Blood Glucose Reference Range: 80 - 120 mg / dl HBO Diabetic Blood Glucose Intervention Range: <131 mg/dl or >324 mg/dl Type: Time Vitals Blood Respiratory Capillary Blood Glucose Pulse Action Pulse: Temperature: Taken: Pressure: Rate: Glucose (mg/dl): Meter #: Oximetry (%) Taken: Pre 01:30 136/58 78 18 97.9 279 1 96 none per protocol Post 15:24 134/60 80 18 98 242 1 99 none per protocol Treatment Response Treatment Toleration: Well Treatment Completion Status: Treatment Completed without Adverse Event Electronic Signature(s) Signed: 04/21/2023 3:49:47 PM By: [name] Signed: 04/23/2023 5:52:14 PM By: [name] Entered By:  [name] on 04/21/2023 15:48:39 Olliff, [name] ([id number]) [id number] Page 2 of 2 -------------------------------------------------------------------------------- HBO Safety Checklist Details Patient Name: Date of Service: [name], [name] 04/21/2023 1:30 PM Medical Record Number: [id number] Patient Account Number: [id number] Date of Birth/Sex: Treating RN: May 27, 1951 (72 y.o. M) Primary Care Yuvia Plant: [name] Other Clinician: Referring Tesia Lybrand: Treating Deiona Hooper/Extender: [name] in Treatment: 11 HBO Safety Checklist Items Safety Checklist Consent Form Signed Patient voided / foley secured and emptied When did you last eato 04/21/23 am Last dose of injectable or oral agent 04/21/23 glucogan Ostomy pouch emptied and vented if applicable NA All implantable devices assessed, documented and approved NA Intravenous access site secured and place NA Valuables secured Linens and cotton and cotton/polyester blend (less than 51% polyester) Personal oil-based products / skin lotions / body lotions removed Wigs or hairpieces removed NA Smoking or tobacco materials removed NA Books / newspapers / magazines / loose paper removed Cologne, aftershave, perfume and deodorant removed Jewelry removed (may wrap wedding band) Make-up removed NA Hair care products removed Battery operated devices (external) removed NA Heating patches and chemical warmers removed NA Titanium eyewear removed NA Nail polish cured greater than 10 hours NA Casting material cured greater than 10 hours NA Hearing aids removed NA Loose dentures or partials removed NA Prosthetics have been removed NA Patient demonstrates correct use of air break device (if applicable) Patient concerns have been addressed Patient grounding bracelet on and cord attached to chamber Specifics for Inpatients (complete in addition to above) Medication sheet sent  with patient Intravenous medications needed or due during therapy sent with patient Drainage tubes (e.g. nasogastric tube or chest tube secured and vented) Endotracheal or Tracheotomy tube secured Cuff deflated of air and inflated with saline Airway suctioned Electronic Signature(s) Signed: 04/21/2023  3:49:47 PM By: [name] Entered By: [name] on 04/21/2023 15:45:59

## 2023-04-24 NOTE — Progress Notes (Signed)
Lawrence [name] ([id number]) [id number] Page 1 of 2 Visit Report for 04/24/2023 Arrival Information Details Patient Name: Date of Service: Lawrence [name], [name] 04/24/2023 10:00 Lawrence M Medical Record Number: [id number] Patient Account Number: [id number] Date of Birth/Sex: Treating RN: 01-Feb-1951 (72 y.o. M) Primary Care Miriana Gaertner: [name] Other Clinician: Referring Danylah Holden: Treating Fernie Grimm/Extender: [name] in Treatment: 12 Visit Information History Since Last Visit Added or deleted any medications: No Patient Arrived: Cane Any new allergies or adverse reactions: No Arrival Time: 10:00 Had Lawrence fall or experienced change in No Accompanied By: self activities of daily living that may affect Transfer Assistance: None risk of falls: Patient Identification Verified: Yes Signs or symptoms of abuse/neglect since last visito No Secondary Verification Process Completed: Yes Hospitalized since last visit: No Patient Requires Transmission-Based Precautions: No Implantable device outside of the clinic excluding No Patient Has Alerts: No cellular tissue based products placed in the center since last visit: Pain Present Now: No Electronic Signature(s) Signed: 04/24/2023 12:20:05 PM By: [name] Entered By: [name] on 04/24/2023 11:43:09 -------------------------------------------------------------------------------- Encounter Discharge Information Details Patient Name: Date of Service: Lawrence [name], [name] 04/24/2023 10:00 Lawrence M Medical Record Number: [id number] Patient Account Number: [id number] Date of Birth/Sex: Treating RN: 02-Apr-1951 (72 y.o. M) Primary Care Ruthetta Koopmann: [name] Other Clinician: Referring Kaylina Cahue: Treating Remmy Crass/Extender: [name] in Treatment: 12 Encounter Discharge Information Items Discharge Condition: Stable Ambulatory Status: Cane Discharge Destination:  Home Transportation: Private Auto Accompanied By: wife Schedule Follow-up Appointment: Yes Clinical Summary of Care: Lawrence [name] ([id number]) [id number] Page 2 of 2 Electronic Signature(s) Signed: 04/24/2023 12:20:05 PM By: [name] Entered By: [name] on 04/24/2023 12:19:34 -------------------------------------------------------------------------------- Vitals Details Patient Name: Date of Service: Lawrence [name], [name] 04/24/2023 10:00 Lawrence M Medical Record Number: [id number] Patient Account Number: [id number] Date of Birth/Sex: Treating RN: 02/28/1951 (72 y.o. M) Primary Care Jazmina Muhlenkamp: [name] Other Clinician: Referring Kashara Blocher: Treating Kristina Mcnorton/Extender: [name] in Treatment: 12 Vital Signs Time Taken: 10:00 Temperature (F): 97.9 Height (in): 70 Pulse (bpm): 68 Weight (lbs): 302 Respiratory Rate (breaths/min): 18 Body Mass Index (BMI): 43.3 Blood Pressure (mmHg): 130/60 Capillary Blood Glucose (mg/dl): 376 Reference Range: 80 - 120 mg / dl Airway Pulse Oximetry (%): 98 Electronic Signature(s) Signed: 04/24/2023 12:20:05 PM By: [name] Entered By: [name] on 04/24/2023 11:44:16

## 2023-04-24 NOTE — Progress Notes (Addendum)
Lawrence [name] ([id number]) [id number] Page 1 of 11 Visit Report for 04/24/2023 Arrival Information Details Patient Name: Date of Service: [name], [name] 04/24/2023 9:00 A M Medical Record Number: [id number] Patient Account Number: [id number] Date of Birth/Sex: Treating RN: 1951/08/26 (72 y.o. Lawrence [name] Primary Care Kathleene Bergemann: [name] Other Clinician: Referring Masato Pettie: Treating Raney Antwine/Extender: [name] in Treatment: 12 Visit Information History Since Last Visit Added or deleted any medications: No Patient Arrived: Lawrence [name] Any new allergies or adverse reactions: No Arrival Time: 09:46 Had a fall or experienced change in No Accompanied By: Wife activities of daily living that may affect Transfer Assistance: None risk of falls: Patient Identification Verified: Yes Hospitalized since last visit: No Secondary Verification Process Completed: Yes Has Dressing in Place as Prescribed: Yes Patient Requires Transmission-Based Precautions: No Pain Present Now: No Patient Has Alerts: No Electronic Signature(s) Signed: 04/27/2023 5:22:58 PM By: Midge Aver MSN RN CNS WTA Entered By: Midge Aver on 04/24/2023 09:47:33 -------------------------------------------------------------------------------- Clinic Level of Care Assessment Details Patient Name: Date of Service: [name], [name] 04/24/2023 9:00 A M Medical Record Number: [id number] Patient Account Number: [id number] Date of Birth/Sex: Treating RN: 1951/04/11 (72 y.o. Lawrence [name]) [name] Primary Care Aamna Mallozzi: [name] Other Clinician: Referring Kenith Trickel: Treating Emalyn Schou/Extender: [name] in Treatment: 12 Clinic Level of Care Assessment Items TOOL 1 Quantity Score []  - 0 Use when EandM and Procedure is performed on INITIAL visit ASSESSMENTS - Nursing Assessment / Reassessment []  - 0 General Physical Exam (combine w/  comprehensive assessment (listed just below) when performed on new pt. evals) []  - 0 Comprehensive Assessment (HX, ROS, Risk Assessments, Wounds Hx, etc.) ASSESSMENTS - Wound and Skin Assessment / Reassessment []  - 0 Dermatologic / Skin Assessment (not related to wound area) ASSESSMENTS - Ostomy and/or Continence Assessment and Care Lawrence [name] ([id number]) [id number] Page 2 of 11 []  - 0 Incontinence Assessment and Management []  - 0 Ostomy Care Assessment and Management (repouching, etc.) PROCESS - Coordination of Care []  - 0 Simple Patient / Family Education for ongoing care []  - 0 Complex (extensive) Patient / Family Education for ongoing care []  - 0 Staff obtains Chiropractor, Records, T Results / Process Orders est []  - 0 Staff telephones HHA, Nursing Homes / Clarify orders / etc []  - 0 Routine Transfer to another Facility (non-emergent condition) []  - 0 Routine Hospital Admission (non-emergent condition) []  - 0 New Admissions / Manufacturing engineer / Ordering NPWT Apligraf, etc. , []  - 0 Emergency Hospital Admission (emergent condition) PROCESS - Special Needs []  - 0 Pediatric / Minor Patient Management []  - 0 Isolation Patient Management []  - 0 Hearing / Language / Visual special needs []  - 0 Assessment of Community assistance (transportation, D/C planning, etc.) []  - 0 Additional assistance / Altered mentation []  - 0 Support Surface(s) Assessment (bed, cushion, seat, etc.) INTERVENTIONS - Miscellaneous []  - 0 External ear exam []  - 0 Patient Transfer (multiple staff / Nurse, adult / Similar devices) []  - 0 Simple Staple / Suture removal (25 or less) []  - 0 Complex Staple / Suture removal (26 or more) []  - 0 Hypo/Hyperglycemic Management (do not check if billed separately) []  - 0 Ankle / Brachial Index (ABI) - do not check if billed separately Has the patient been seen at the hospital within the last three years: Yes Total  Score: 0 Level Of Care: ____ Electronic Signature(s) Signed: 04/27/2023 9:32:51 AM By: Jettie Pagan,  Lyla Son RN Entered By: [name] on 04/24/2023 11:35:02 -------------------------------------------------------------------------------- Encounter Discharge Information Details Patient Name: Date of Service: [name], [name] 04/24/2023 9:00 A M Medical Record Number: [id number] Patient Account Number: [id number] Date of Birth/Sex: Treating RN: 05-29-1951 (72 y.o. Lawrence [name]) [name] Primary Care Sherrell Farish: [name] Other Clinician: Referring Kairav Russomanno: Treating Maycie Luera/Extender: [name] in Treatment: 12 Encounter Discharge Information Items Post Procedure Vitals Discharge Condition: Stable Temperature (F): 98.4 Lawrence [name] ([id number]) [id number] Page 3 of 11 Ambulatory Status: Cane Pulse (bpm): 73 Discharge Destination: Home Respiratory Rate (breaths/min): 16 Transportation: Private Auto Blood Pressure (mmHg): 134/68 Accompanied By: wife Schedule Follow-up Appointment: Yes Clinical Summary of Care: Electronic Signature(s) Signed: 04/24/2023 11:36:40 AM By: [name] RN Entered By: [name] on 04/24/2023 11:36:40 -------------------------------------------------------------------------------- Lower Extremity Assessment Details Patient Name: Date of Service: [name], [name] 04/24/2023 9:00 A M Medical Record Number: [id number] Patient Account Number: [id number] Date of Birth/Sex: Treating RN: 08-21-1951 (72 y.o. Lawrence [name]) [name] Primary Care Hari Casaus: [name] Other Clinician: Referring Janyiah Silveri: Treating Clay Solum/Extender: [name] in Treatment: 12 Edema Assessment Assessed: [Left: No] [Right: No] [Left: Edema] [Right: :] Calf Left: Right: Point of Measurement: 33 cm From Medial Instep 44 cm 45 cm Ankle Left: Right: Point of Measurement: 10 cm From Medial Instep 31 cm  31 cm Vascular Assessment Pulses: Dorsalis Pedis Palpable: [Left:Yes] [Right:Yes] Electronic Signature(s) Signed: 04/24/2023 11:14:26 AM By: [name] RN Entered By: [name] on 04/24/2023 11:14:25 Multi Wound Chart Details -------------------------------------------------------------------------------- Lawrence [name] ([id number]) [id number] Page 4 of 11 Patient Name: Date of Service: [name], [name] 04/24/2023 9:00 A M Medical Record Number: [id number] Patient Account Number: [id number] Date of Birth/Sex: Treating RN: 01/20/1951 (72 y.o. Lawrence [name]) [name] Primary Care Tawan Corkern: [name] Other Clinician: Referring Vibha Ferdig: Treating Khalon Cansler/Extender: [name] in Treatment: 12 Vital Signs Height(in): 70 Pulse(bpm): 73 Weight(lbs): 302 Blood Pressure(mmHg): 134/68 Body Mass Index(BMI): 43.3 Temperature(F): 98.4 Respiratory Rate(breaths/min): 16 Wound Assessments Wound Number: 1 2 4  Photos: Right T Second oe Right T Third oe Left T Third oe Wound Location: Gradually Appeared Gradually Appeared Gradually Appeared Wounding Event: Diabetic Wound/Ulcer of the Lower Diabetic Wound/Ulcer of the Lower Diabetic Wound/Ulcer of the Lower Primary Etiology: Extremity Extremity Extremity Glaucoma, Coronary Artery Disease, Glaucoma, Coronary Artery Disease, Glaucoma, Coronary Artery Disease, Comorbid History: Hypertension, Myocardial Infarction, Hypertension, Myocardial Infarction, Hypertension, Myocardial Infarction, Type II Diabetes, End Stage Renal Type II Diabetes, End Stage Renal Type II Diabetes, End Stage Renal Disease Disease Disease 11/03/2022 11/03/2022 12/05/2022 Date Acquired: 12 12 12  Weeks of Treatment: Open Open Open Wound Status: No No No Wound Recurrence: Yes Yes Yes Pending A mputation on Presentation: 0.4x0.4x0.1 0.1x0.3x0.1 0.4x0.5x0.3 Measurements L x W x D (cm) 0.126 0.024 0.157 A (cm)  : rea 0.013 0.002 0.047 Volume (cm) : 83.90% 94.90% 91.70% % Reduction in A rea: 94.50% 95.70% 87.50% % Reduction in Volume: Grade 3 Grade 3 Grade 3 Classification: Medium Medium Medium Exudate A mount: Serosanguineous Serosanguineous Serosanguineous Exudate Type: red, brown red, brown red, brown Exudate Color: Distinct, outline attached Distinct, outline attached N/A Wound Margin: Small (1-33%) None Present (0%) Small (1-33%) Granulation A mount: Pink N/A Pink Granulation Quality: Large (67-100%) Large (67-100%) Large (67-100%) Necrotic A mount: Bone: Yes Fat Layer (Subcutaneous Tissue): Yes Fat Layer (Subcutaneous Tissue): Yes Exposed Structures: Fat Layer (Subcutaneous Tissue): No Fascia: No Fascia: No Tendon: No Tendon: No  Muscle: No Muscle: No Joint: No Joint: No Bone: No Bone: No None None N/A Epithelialization: Treatment Notes Electronic Signature(s) Signed: 04/24/2023 11:15:37 AM By: [name] RN Entered By: [name] on 04/24/2023 11:15:37 Lannen, [name] ([id number]) [id number] Page 5 of 11 -------------------------------------------------------------------------------- Multi-Disciplinary Care Plan Details Patient Name: Date of Service: [name], [name] 04/24/2023 9:00 A M Medical Record Number: [id number] Patient Account Number: [id number] Date of Birth/Sex: Treating RN: 1951-05-05 (72 y.o. Lawrence [name]) [name] Primary Care Amaani Guilbault: [name] Other Clinician: Referring Zoha Spranger: Treating [place]/Extender: [name] in Treatment: 12 Active Inactive Wound/Skin Impairment Nursing Diagnoses: Knowledge deficit related to ulceration/compromised skin integrity Goals: Patient/caregiver will verbalize understanding of skin care regimen Date Initiated: 01/30/2023 Target Resolution Date: 05/02/2023 Goal Status: Active Ulcer/skin breakdown will have a volume reduction of 30% by week 4 Date  Initiated: 01/30/2023 Date Inactivated: 03/06/2023 Target Resolution Date: 03/02/2023 Goal Status: Unmet Unmet Reason: comorbidities Ulcer/skin breakdown will have a volume reduction of 50% by week 8 Date Initiated: 01/30/2023 Date Inactivated: 04/10/2023 Target Resolution Date: 04/01/2023 Goal Status: Unmet Unmet Reason: comorbidities Ulcer/skin breakdown will have a volume reduction of 80% by week 12 Date Initiated: 01/30/2023 Target Resolution Date: 05/02/2023 Goal Status: Active Ulcer/skin breakdown will heal within 14 weeks Date Initiated: 01/30/2023 Target Resolution Date: 06/01/2023 Goal Status: Active Interventions: Assess patient/caregiver ability to obtain necessary supplies Assess patient/caregiver ability to perform ulcer/skin care regimen upon admission and as needed Assess ulceration(s) every visit Notes: Electronic Signature(s) Signed: 04/24/2023 11:35:21 AM By: [name] RN Entered By: [name] on 04/24/2023 11:35:20 -------------------------------------------------------------------------------- Pain Assessment Details Patient Name: Date of Service: [name], [name] 04/24/2023 9:00 A M Medical Record Number: [id number] Patient Account Number: [id number] ARNALDO, HEFFRON ([id number]) [phone] Page 6 of 11 Date of Birth/Sex: Treating RN: 05/15/1951 (72 y.o. Lawrence [name] Primary Care Zahara Rembert: Other Clinician: [name] Referring Aurielle Slingerland: Treating Satya Bohall/Extender: [name] in Treatment: 12 Active Problems Location of Pain Severity and Description of Pain Patient Has Paino No Site Locations Pain Management and Medication Current Pain Management: Electronic Signature(s) Signed: 04/27/2023 5:22:58 PM By: Midge Aver MSN RN CNS WTA Entered By: Midge Aver on 04/24/2023 09:48:01 -------------------------------------------------------------------------------- Patient/Caregiver Education  Details Patient Name: Date of Service: [name] MS, [name] 6/21/2024andnbsp9:00 A M Medical Record Number: [id number] Patient Account Number: [id number] Date of Birth/Gender: Treating RN: [birth year] (72 y.o. [name] Primary Care Physician: [name] Other Clinician: Referring Physician: Treating Physician/Extender: [name] in Treatment: 12 Education Assessment Education Provided To: Patient Education Topics Provided Wound/Skin Impairment: Handouts: Caring for Your Ulcer Methods: Explain/Verbal Responses: State content correctly Electronic Signature(s) Lawrence [name] ([id number]) [id number] Page 7 of 11 Signed: 04/27/2023 9:32:51 AM By: [name] RN Entered By: [name] on 04/24/2023 11:35:35 -------------------------------------------------------------------------------- Wound Assessment Details Patient Name: Date of Service: [name], [name] 04/24/2023 9:00 A M Medical Record Number: [id number] Patient Account Number: [id number] Date of Birth/Sex: Treating RN: [birth year] (72 y.o. Lawrence [name]) [name] Primary Care Jalea Bronaugh: [name] Other Clinician: Referring Cherysh Epperly: Treating Joffre Lucks/Extender: [name] in Treatment: 12 Wound Status Wound Number: 1 Primary Diabetic Wound/Ulcer of the Lower Extremity Etiology: Wound Location: Right T Second oe Wound Open Wounding Event: Gradually Appeared Status: Date Acquired: 11/03/2022 Comorbid Glaucoma, Coronary Artery Disease, Hypertension, Myocardial Weeks Of Treatment: 12 History: Infarction, Type II Diabetes, End Stage Renal Disease Clustered Wound: No  Pending Amputation On Presentation Photos Wound Measurements Length: (cm) 0.8 Width: (cm) 1 Depth: (cm) 0.1 Area: (cm) 0.628 Volume: (cm) 0.063 % Reduction in Area: 20% % Reduction in Volume: 73.3% Epithelialization: None Tunneling: No Undermining: No Wound  Description Classification: Grade 3 Wound Margin: Distinct, outline attached Exudate Amount: Medium Exudate Type: Serosanguineous Exudate Color: red, brown Foul Odor After Cleansing: No Slough/Fibrino Yes Wound Bed Granulation Amount: Small (1-33%) Exposed Structure Granulation Quality: Pink Fat Layer (Subcutaneous Tissue) Exposed: No Necrotic Amount: Large (67-100%) Bone Exposed: Yes Treatment Notes Wound #1 (Toe Second) Wound Laterality: Right Cleanser Soap and [address] JONAVEN, HILGERS ([id number]) [id number] Page 8 of 11 Discharge Instruction: Gently cleanse wound with antibacterial soap, rinse and pat dry prior to dressing wounds Peri-Wound Care Topical Primary Dressing Silvercel Small 2x2 (in/in) Discharge Instruction: Apply Silvercel Small 2x2 (in/in) as instructed Secondary Dressing Coverlet Latex-Free Fabric Adhesive Dressings Discharge Instruction: 1.5 x 2 Secured With Compression Wrap Compression Stockings Add-Ons Electronic Signature(s) Signed: 04/27/2023 9:32:51 AM By: [name] RN Previous Signature: 04/24/2023 10:25:20 AM Version By: [name] RN Entered By: [name] on 04/24/2023 11:34:43 -------------------------------------------------------------------------------- Wound Assessment Details Patient Name: Date of Service: [name], [name] 04/24/2023 9:00 A M Medical Record Number: [id number] Patient Account Number: [id number] Date of Birth/Sex: Treating RN: 01/31/1951 (72 y.o. Lawrence [name]) [name] Primary Care Reema Chick: [name] Other Clinician: Referring Emillio Ngo: Treating Martavis Gurney/Extender: [name] in Treatment: 12 Wound Status Wound Number: 2 Primary Diabetic Wound/Ulcer of the Lower Extremity Etiology: Wound Location: Right T Third oe Wound Open Wounding Event: Gradually Appeared Status: Date Acquired: 11/03/2022 Comorbid Glaucoma, Coronary Artery Disease, Hypertension,  Myocardial Weeks Of Treatment: 12 History: Infarction, Type II Diabetes, End Stage Renal Disease Clustered Wound: No Pending Amputation On Presentation Photos Wound Measurements Length: (cm) 0.1 Width: (cm) 0.4 Depth: (cm) 0.1 Henigan, [name] ([id number]) Area: (cm) 0.031 Volume: (cm) 0.003 % Reduction in Area: 93.4% % Reduction in Volume: 93.6% Epithelialization: None [id number] Page 9 of 11 Tunneling: No Undermining: No Wound Description Classification: Grade 3 Wound Margin: Distinct, outline attached Exudate Amount: Medium Exudate Type: Serosanguineous Exudate Color: red, brown Foul Odor After Cleansing: No Slough/Fibrino Yes Wound Bed Granulation Amount: None Present (0%) Exposed Structure Necrotic Amount: Large (67-100%) Fascia Exposed: No Fat Layer (Subcutaneous Tissue) Exposed: Yes Tendon Exposed: No Muscle Exposed: No Joint Exposed: No Bone Exposed: No Treatment Notes Wound #2 (Toe Third) Wound Laterality: Right Cleanser Soap and Water Discharge Instruction: Gently cleanse wound with antibacterial soap, rinse and pat dry prior to dressing wounds Peri-Wound Care Topical Primary Dressing Silvercel Small 2x2 (in/in) Discharge Instruction: Apply Silvercel Small 2x2 (in/in) as instructed Secondary Dressing Coverlet Latex-Free Fabric Adhesive Dressings Discharge Instruction: 1.5 x 2 Secured With Compression Wrap Compression Stockings Add-Ons Electronic Signature(s) Signed: 04/27/2023 9:32:51 AM By: [name] RN Previous Signature: 04/24/2023 10:25:59 AM Version By: [name] RN Entered By: [name] on 04/24/2023 11:34:43 -------------------------------------------------------------------------------- Wound Assessment Details Patient Name: Date of Service: [name] MS, [name] 04/24/2023 9:00 A M Medical Record Number: [id number] Patient Account Number: [id number] Date of Birth/Sex: Treating RN: August 13, 1951 (72 y.o. Lawrence [name])  [name] Primary Care Holland Nickson: [name] Other Clinician: Referring Teejay Meader: Treating Tannon Peerson/Extender: [name] in Treatment: [address] Wound Status Lawrence [name] ([id number]) [id number] Page 10 of 11 Wound Number: 4 Primary Diabetic Wound/Ulcer of the Lower Extremity Etiology: Wound Location: Left T Third oe Wound Open Wounding Event: Gradually Appeared Status: Date Acquired:  12/05/2022 Comorbid Glaucoma, Coronary Artery Disease, Hypertension, Myocardial Weeks Of Treatment: 12 History: Infarction, Type II Diabetes, End Stage Renal Disease Clustered Wound: No Pending Amputation On Presentation Photos Wound Measurements Length: (cm) 0.8 Width: (cm) 1.2 Depth: (cm) 0.3 Area: (cm) 0.754 Volume: (cm) 0.226 % Reduction in Area: 60% % Reduction in Volume: 40.1% Tunneling: No Undermining: No Wound Description Classification: Grade 3 Exudate Amount: Medium Exudate Type: Serosanguineous Exudate Color: red, brown Wound Bed Granulation Amount: Small (1-33%) Exposed Structure Granulation Quality: Pink Fascia Exposed: No Necrotic Amount: Large (67-100%) Fat Layer (Subcutaneous Tissue) Exposed: Yes Tendon Exposed: No Muscle Exposed: No Joint Exposed: No Bone Exposed: No Treatment Notes Wound #4 (Toe Third) Wound Laterality: Left Cleanser Soap and Water Discharge Instruction: Gently cleanse wound with antibacterial soap, rinse and pat dry prior to dressing wounds Peri-Wound Care Topical Primary Dressing Silvercel Small 2x2 (in/in) Discharge Instruction: Apply Silvercel Small 2x2 (in/in) as instructed Secondary Dressing Coverlet Latex-Free Fabric Adhesive Dressings Discharge Instruction: 1.5 x 2 Secured With Compression Wrap Compression Stockings Add-Ons Electronic Signature(s) Signed: 04/27/2023 9:32:51 AM By: [name] RN Previous Signature: 04/24/2023 10:26:24 AM Version By: [name] RN Kaley,  [name] ([id number]) [id number] Page 11 of 11 Entered By: [name] on 04/24/2023 11:34:44 -------------------------------------------------------------------------------- Vitals Details Patient Name: Date of Service: [name], [name] 04/24/2023 9:00 A M Medical Record Number: [id number] Patient Account Number: [id number] Date of Birth/Sex: Treating RN: 1951-07-05 (72 y.o. Lawrence [name] Primary Care Sedona Wenk: [name] Other Clinician: Referring Quinnlan Abruzzo: Treating Maximilliano Kersh/Extender: [name] in Treatment: 12 Vital Signs Time Taken: 09:40 Temperature (F): 98.4 Height (in): 70 Pulse (bpm): 73 Weight (lbs): 302 Respiratory Rate (breaths/min): 16 Body Mass Index (BMI): 43.3 Blood Pressure (mmHg): 134/68 Reference Range: 80 - 120 mg / dl Electronic Signature(s) Signed: 04/27/2023 5:22:58 PM By: Midge Aver MSN RN CNS WTA Entered By: Midge Aver on 04/24/2023 09:47:54

## 2023-04-24 NOTE — Progress Notes (Signed)
Lawrence [name] ([id number]) [id number] Page 1 of 2 Visit Report for 04/24/2023 Chief Complaint Document Details Patient Name: Date of Service: [name], [name] 04/24/2023 9:00 A M Medical Record Number: [id number] Patient Account Number: [id number] Date of Birth/Sex: Treating RN: Oct 10, 1951 (72 y.o. M) Primary Care Provider: [name] Other Clinician: Referring Provider: Treating Provider/Extender: [name] in Treatment: 12 Information Obtained from: Patient Chief Complaint Bilateral foot ulcers Electronic Signature(s) Signed: 04/24/2023 9:05:17 AM By: [name] Entered By: [name] on 04/24/2023 09:05:17 -------------------------------------------------------------------------------- Problem List Details Patient Name: Date of Service: [name], [name] 04/24/2023 9:00 A M Medical Record Number: [id number] Patient Account Number: [id number] Date of Birth/Sex: Treating RN: Jul 19, 1951 (72 y.o. 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/30/2023 No Yes layer exposed Lawrence [name] ([id number]) [id number] Page 2 of 2 [phone] Non-pressure chronic ulcer of other part of right foot with fat 01/30/2023 No Yes layer exposed 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

## 2023-04-24 NOTE — Progress Notes (Signed)
Lawrence [name] ([id number]) [id number] Page 1 of 2 Visit Report for 04/23/2023 HBO Details Patient Name: Date of Service: Lawrence [name], Lawrence RLA ND W. 04/23/2023 1:30 PM Medical Record Number: [id number] Patient Account Number: [id number] Date of Birth/Sex: Treating RN: 1950-12-02 (72 y.o. M) Primary Care Ovida Delagarza: [name] Other Clinician: Referring Samar Dass: Treating Sherryl Valido/Extender: [name] in Treatment: 11 HBO Treatment Course Details Treatment Course Number: 1 Ordering Milca Sytsma: [name] T Treatments Ordered: otal 40 HBO Treatment Start Date: 02/24/2023 HBO Indication: Other (specify in Notes) HBO Treatment Details Treatment Number: 33 Patient Type: Outpatient Chamber Type: Monoplace Chamber Serial #: [id number] Treatment Protocol: 2.0 ATA with 90 minutes oxygen, and no air breaks Treatment Details Compression Rate Down: 2.0 psi / minute De-Compression Rate Up: 1.5 psi / minute Air breaks and breathing Decompress Decompress Compress Tx Pressure Begins Reached periods Begins Ends (leave unused spaces blank) Chamber Pressure (ATA 1 2 ------2 1 ) Clock Time (24 hr) 12:53 1:03 - - - - - - 14:34 14:44 Treatment Length: 111 (minutes) Treatment Segments: 4 Vital Signs Capillary Blood Glucose Reference Range: 80 - 120 mg / dl HBO Diabetic Blood Glucose Intervention Range: <131 mg/dl or >782 mg/dl Type: Time Vitals Blood Respiratory Capillary Blood Glucose Pulse Action Pulse: Temperature: Taken: Pressure: Rate: Glucose (mg/dl): Meter #: Oximetry (%) Taken: Pre 12:45 140/68 77 18 97.9 206 1 98 none per protocol Post 14:45 136/64 78 18 98 184 1 98 none per protocol Treatment Response Treatment Toleration: Well Treatment Completion Status: Treatment Completed without Adverse Event Electronic Signature(s) Signed: 04/23/2023 3:13:38 PM By: [name] Signed: 04/23/2023 5:52:14 PM By: [name] Entered By:  [name] on 04/23/2023 15:11:10 Borger, [name] ([id number]) [id number] Page 2 of 2 -------------------------------------------------------------------------------- HBO Safety Checklist Details Patient Name: Date of Service: Lawrence [name], Lawrence RLA ND W. 04/23/2023 1:30 PM Medical Record Number: [id number] Patient Account Number: [id number] Date of Birth/Sex: Treating RN: [birth year] (72 y.o. M) Primary Care Jovonda Selner: [name] Other Clinician: Referring Aqib Lough: Treating Keiri Solano/Extender: [name] in Treatment: 11 HBO Safety Checklist Items Safety Checklist Consent Form Signed Patient voided / foley secured and emptied When did you last eato 04/23/23 Last dose of injectable or oral agent Ostomy pouch emptied and vented if applicable NA All implantable devices assessed, documented and approved NA Intravenous access site secured and place NA Valuables secured Linens and cotton and cotton/polyester blend (less than 51% polyester) Personal oil-based products / skin lotions / body lotions removed Wigs or hairpieces removed NA Smoking or tobacco materials removed NA Books / newspapers / magazines / loose paper removed Cologne, aftershave, perfume and deodorant removed Jewelry removed (may wrap wedding band) Make-up removed NA Hair care products removed Battery operated devices (external) removed NA Heating patches and chemical warmers removed NA Titanium eyewear removed NA Nail polish cured greater than 10 hours NA Casting material cured greater than 10 hours NA Hearing aids removed NA Loose dentures or partials removed NA Prosthetics have been removed NA Patient demonstrates correct use of air break device (if applicable) Patient concerns have been addressed Patient grounding bracelet on and cord attached to chamber Specifics for Inpatients (complete in addition to above) Medication sheet sent with  patient Intravenous medications needed or due during therapy sent with patient Drainage tubes (e.g. nasogastric tube or chest tube secured and vented) Endotracheal or Tracheotomy tube secured Cuff deflated of air and inflated with saline Airway suctioned Electronic Signature(s) Signed: 04/23/2023 3:13:38 PM By:  [name] Entered By: [name] on 04/23/2023 15:09:08

## 2023-04-24 NOTE — Progress Notes (Signed)
Lawrence [name] ([id number]) [id number] Page 1 of 2 Visit Report for 04/24/2023 HBO Details Patient Name: Date of Service: Lawrence [name], Lawrence RLA ND W. 04/24/2023 10:00 Lawrence M Medical Record Number: [id number] Patient Account Number: [id number] Date of Birth/Sex: Treating RN: Dec 16, 1950 (72 y.o. M) Primary Care Alanie Syler: [name] Other Clinician: Referring Ahmaya Ostermiller: Treating Edwin Baines/Extender: [name] in Treatment: 12 HBO Treatment Course Details Treatment Course Number: 1 Ordering Mesha Schamberger: [name] T Treatments Ordered: otal 40 HBO Treatment Start Date: 02/24/2023 HBO Indication: Other (specify in Notes) HBO Treatment Details Treatment Number: 34 Patient Type: Outpatient Chamber Type: Monoplace Chamber Serial #: [id number] Treatment Protocol: 2.0 ATA with 90 minutes oxygen, and no air breaks Treatment Details Compression Rate Down: 2.0 psi / minute De-Compression Rate Up: 1.5 psi / minute Air breaks and breathing Decompress Decompress Compress Tx Pressure Begins Reached periods Begins Ends (leave unused spaces blank) Chamber Pressure (ATA 1 2 ------2 1 ) Clock Time (24 hr) 10:17 10:27 - - - - - - 11:47 12:07 Treatment Length: 110 (minutes) Treatment Segments: 4 Vital Signs Capillary Blood Glucose Reference Range: 80 - 120 mg / dl HBO Diabetic Blood Glucose Intervention Range: <131 mg/dl or >782 mg/dl Type: Time Vitals Blood Respiratory Capillary Blood Glucose Pulse Action Pulse: Temperature: Taken: Pressure: Rate: Glucose (mg/dl): Meter #: Oximetry (%) Taken: Pre 10:00 130/60 68 18 97.9 297 98 none per protocol Post 12:07 138/64 70 18 98 291 1 98 none per protocol Treatment Response Treatment Toleration: Well Treatment Completion Status: Treatment Completed without Adverse Event Electronic Signature(s) Signed: 04/24/2023 12:20:05 PM By: [name] Signed: 04/24/2023 1:44:40 PM By: [name] Entered By:  [name] on 04/24/2023 12:19:01 Broner, [name] ([id number]) [id number] Page 2 of 2 -------------------------------------------------------------------------------- HBO Safety Checklist Details Patient Name: Date of Service: Lawrence [name], Lawrence RLA ND W. 04/24/2023 10:00 Lawrence M Medical Record Number: [id number] Patient Account Number: [id number] Date of Birth/Sex: Treating RN: May 18, 1951 (72 y.o. M) Primary Care Jadarrius Maselli: [name] Other Clinician: Referring Traver Meckes: Treating Cambre Matson/Extender: [name] in Treatment: 12 HBO Safety Checklist Items Safety Checklist Consent Form Signed Patient voided / foley secured and emptied When did you last eato 04/24/23 Last dose of injectable or oral agent Ostomy pouch emptied and vented if applicable NA All implantable devices assessed, documented and approved NA Intravenous access site secured and place NA Valuables secured Linens and cotton and cotton/polyester blend (less than 51% polyester) Personal oil-based products / skin lotions / body lotions removed Wigs or hairpieces removed NA Smoking or tobacco materials removed NA Books / newspapers / magazines / loose paper removed NA Cologne, aftershave, perfume and deodorant removed Jewelry removed (may wrap wedding band) Make-up removed Hair care products removed Battery operated devices (external) removed NA Heating patches and chemical warmers removed NA Titanium eyewear removed NA Nail polish cured greater than 10 hours NA Casting material cured greater than 10 hours NA Hearing aids removed NA Loose dentures or partials removed NA Prosthetics have been removed NA Patient demonstrates correct use of air break device (if applicable) Patient concerns have been addressed Patient grounding bracelet on and cord attached to chamber Specifics for Inpatients (complete in addition to above) Medication sheet sent with  patient Intravenous medications needed or due during therapy sent with patient Drainage tubes (e.g. nasogastric tube or chest tube secured and vented) Endotracheal or Tracheotomy tube secured Cuff deflated of air and inflated with saline Airway suctioned Electronic Signature(s) Signed: 04/24/2023 12:20:05 PM  By: [name] Entered By: [name] on 04/24/2023 12:18:10

## 2023-04-27 ENCOUNTER — Emergency Department: Payer: Medicare Other

## 2023-04-27 ENCOUNTER — Encounter: Payer: Medicare Other | Admitting: Physician Assistant

## 2023-04-27 ENCOUNTER — Encounter: Payer: Self-pay | Admitting: Emergency Medicine

## 2023-04-27 ENCOUNTER — Emergency Department
Admission: EM | Admit: 2023-04-27 | Discharge: 2023-04-28 | Disposition: A | Discharge status: Home / Self Care | Payer: Medicare Other | Attending: Emergency Medicine | Admitting: Emergency Medicine

## 2023-04-27 ENCOUNTER — Other Ambulatory Visit: Payer: Self-pay

## 2023-04-27 DIAGNOSIS — R079 Chest pain, unspecified: Secondary | ICD-10-CM

## 2023-04-27 DIAGNOSIS — I25119 Atherosclerotic heart disease of native coronary artery with unspecified angina pectoris: Secondary | ICD-10-CM

## 2023-04-27 DIAGNOSIS — I2510 Atherosclerotic heart disease of native coronary artery without angina pectoris: Secondary | ICD-10-CM | POA: Diagnosis not present

## 2023-04-27 DIAGNOSIS — E109 Type 1 diabetes mellitus without complications: Secondary | ICD-10-CM | POA: Insufficient documentation

## 2023-04-27 DIAGNOSIS — E10621 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: Secondary | ICD-10-CM | POA: Diagnosis not present

## 2023-04-27 LAB — COMPREHENSIVE METABOLIC PANEL
ALT: 17 U/L (ref 0–44)
AST: 21 U/L (ref 15–41)
Albumin: 3.5 g/dL (ref 3.5–5.0)
Alkaline Phosphatase: 94 U/L (ref 38–126)
Anion gap: 7 (ref 5–15)
BUN: 26 mg/dL — ABNORMAL HIGH (ref 8–23)
CO2: 26 mmol/L (ref 22–32)
Calcium: 8.5 mg/dL — ABNORMAL LOW (ref 8.9–10.3)
Chloride: 100 mmol/L (ref 98–111)
Creatinine, Ser: 1.09 mg/dL (ref 0.61–1.24)
GFR, Estimated: >60 mL/min (ref >60)
Glucose, Bld: 357 mg/dL — ABNORMAL HIGH (ref 70–99)
Potassium: 3.9 mmol/L (ref 3.5–5.1)
Sodium: 133 mmol/L — ABNORMAL LOW (ref 135–145)
Total Bilirubin: 0.6 mg/dL (ref 0.3–1.2)
Total Protein: 6.7 g/dL (ref 6.5–8.1)

## 2023-04-27 LAB — CBC WITH DIFFERENTIAL/PLATELET
Abs Immature Granulocytes: 0.01 10*3/uL (ref 0.00–0.07)
Basophils Absolute: 0 10*3/uL (ref 0.0–0.1)
Basophils Relative: 1 %
Eosinophils Absolute: 0.1 10*3/uL (ref 0.0–0.5)
Eosinophils Relative: 2 %
HCT: 38.5 % — ABNORMAL LOW (ref 39.0–52.0)
Hemoglobin: 12.5 g/dL — ABNORMAL LOW (ref 13.0–17.0)
Immature Granulocytes: 0 %
Lymphocytes Relative: 24 %
Lymphs Abs: 1.6 10*3/uL (ref 0.7–4.0)
MCH: 31.6 pg (ref 26.0–34.0)
MCHC: 32.5 g/dL (ref 30.0–36.0)
MCV: 97.5 fL (ref 80.0–100.0)
Monocytes Absolute: 0.5 10*3/uL (ref 0.1–1.0)
Monocytes Relative: 8 %
Neutro Abs: 4.2 10*3/uL (ref 1.7–7.7)
Neutrophils Relative %: 65 %
Platelets: 205 10*3/uL (ref 150–400)
RBC: 3.95 MIL/uL — ABNORMAL LOW (ref 4.22–5.81)
RDW: 12.5 % (ref 11.5–15.5)
WBC: 6.5 10*3/uL (ref 4.0–10.5)
nRBC: 0 % (ref 0.0–0.2)

## 2023-04-27 LAB — TROPONIN I (HIGH SENSITIVITY): Troponin I (High Sensitivity): 9 ng/L (ref <18)

## 2023-04-27 LAB — GLUCOSE, CAPILLARY
Glucose-Capillary: 349 mg/dL — ABNORMAL HIGH (ref 70–99)
Glucose-Capillary: 309 mg/dL — ABNORMAL HIGH (ref 70–99)

## 2023-04-27 LAB — LIPASE, BLOOD: Lipase: 24 U/L (ref 11–51)

## 2023-04-27 NOTE — ED Triage Notes (Signed)
Pt presents via [facility] from home with complaints of epigastric CP x 2 hours. Hx of bypass and MI. He notes taking nitroglycerin PTA and had one spray with EMS and his pain has improved from 8/10 to a 3/10. A&Ox4 at this time. Denies CP or SOB.

## 2023-04-27 NOTE — Progress Notes (Signed)
Lawrence [name] ([id number]) [id number] Page 1 of 2 Visit Report for 04/27/2023 HBO Details Patient Name: Date of Service: [name], [name] 04/27/2023 1:30 PM Medical Record Number: [id number] Patient Account Number: [id number] Date of Birth/Sex: Treating RN: 1950/12/15 (72 y.o. M) Primary Care Lawrence [name]: Lawrence [name] Other Clinician: Referring Lawrence [name]: Treating Lawrence [name]/Extender: Lawrence [name] in Treatment: 12 HBO Treatment Course Details Treatment Course Number: 1 Ordering Lawrence [name]: Lawrence [name] T Treatments Ordered: otal 40 HBO Treatment Start Date: 02/24/2023 HBO Indication: Other (specify in Notes) HBO Treatment Details Treatment Number: 35 Patient Type: Outpatient Chamber Type: Monoplace Chamber Serial #: [id number] Treatment Protocol: 2.0 ATA with 90 minutes oxygen, and no air breaks Treatment Details Compression Rate Down: 2.0 psi / minute De-Compression Rate Up: 1.5 psi / minute Air breaks and breathing Decompress Decompress Compress Tx Pressure Begins Reached periods Begins Ends (leave unused spaces blank) Chamber Pressure (ATA 1 2 ------2 1 ) Clock Time (24 hr) 13:34 13:46 - - - - - - 15:24 15:33 Treatment Length: 119 (minutes) Treatment Segments: 4 Vital Signs Capillary Blood Glucose Reference Range: 80 - 120 mg / dl HBO Diabetic Blood Glucose Intervention Range: <131 mg/dl or >244 mg/dl Type: Time Vitals Blood Respiratory Capillary Blood Glucose Pulse Action Pulse: Temperature: Taken: Pressure: Rate: Glucose (mg/dl): Meter #: Oximetry (%) Taken: Pre 01:30 122/64 82 18 98 349 1 96 none per protocol Post 15:33 142/70 88 18 98 301 1 none per protocol Treatment Response Treatment Toleration: Well Treatment Completion Status: Treatment Completed without Adverse Event Electronic Signature(s) Signed: 04/27/2023 4:07:29 PM By: Lawrence [name] Signed: 04/27/2023 5:56:33 PM By: Lawrence Derry PA-C Entered By: Lawrence [name] on 04/27/2023 15:56:49 Lawrence [name] ([id number]) [id number] Page 2 of 2 -------------------------------------------------------------------------------- HBO Safety Checklist Details Patient Name: Date of Service: [name], [name] 04/27/2023 1:30 PM Medical Record Number: [id number] Patient Account Number: [id number] Date of Birth/Sex: Treating RN: 1951/02/17 (72 y.o. M) Primary Care Lawrence [name]: Lawrence [name] Other Clinician: Referring Lawrence [name]: Treating Lawrence [name]/Extender: Lawrence [name] in Treatment: 12 HBO Safety Checklist Items Safety Checklist Consent Form Signed Patient voided / foley secured and emptied When did you last eato 04/27/23 Last dose of injectable or oral agent 04/27/23 glucogan Ostomy pouch emptied and vented if applicable NA All implantable devices assessed, documented and approved NA Intravenous access site secured and place NA Valuables secured Linens and cotton and cotton/polyester blend (less than 51% polyester) Personal oil-based products / skin lotions / body lotions removed Wigs or hairpieces removed NA Smoking or tobacco materials removed NA Books / newspapers / magazines / loose paper removed Cologne, aftershave, perfume and deodorant removed Jewelry removed (may wrap wedding band) Make-up removed Hair care products removed Battery operated devices (external) removed NA Heating patches and chemical warmers removed NA Titanium eyewear removed NA Nail polish cured greater than 10 hours NA Casting material cured greater than 10 hours NA Hearing aids removed NA Loose dentures or partials removed NA Prosthetics have been removed NA Patient demonstrates correct use of air break device (if applicable) Patient concerns have been addressed Patient grounding bracelet on and cord attached to chamber Specifics for Inpatients (complete in addition to above) Medication sheet sent with  patient Intravenous medications needed or due during therapy sent with patient Drainage tubes (e.g. nasogastric tube or chest tube secured and vented) Endotracheal or Tracheotomy tube secured Cuff deflated of air and inflated with saline Airway suctioned Electronic Signature(s) Signed: 04/27/2023 4:07:29 PM By:  Lawrence [name] Entered By: Lawrence [name] on 04/27/2023 15:55:53

## 2023-04-27 NOTE — ED Triage Notes (Signed)
EMS brings pt in from home for c/o mid CP radiating into neck this evening; NTG and 324mg  ASA taken at home; 2nd NTG admin enroute

## 2023-04-27 NOTE — Progress Notes (Signed)
[name] ([id number]) [id number] Page 1 of 2 Visit Report for 04/27/2023 Arrival Information Details Patient Name: Date of Service: [name], [name] 04/27/2023 1:30 PM Medical Record Number: [id number] Patient Account Number: [id number] Date of Birth/Sex: Treating RN: 03/30/1951 (72 y.o. M) Primary Care [place] Mcphee: [name] Other Clinician: Referring Chaise Mahabir: Treating Martavion Couper/Extender: [name] in Treatment: 12 Visit Information History Since Last Visit Added or deleted any medications: No Patient Arrived: Cane Any new allergies or adverse reactions: No Arrival Time: 13:47 Had a fall or experienced change in No Accompanied By: wife activities of daily living that may affect Transfer Assistance: None risk of falls: Patient Identification Verified: Yes Signs or symptoms of abuse/neglect since last visito No Secondary Verification Process Completed: Yes Hospitalized since last visit: No Patient Requires Transmission-Based Precautions: No Implantable device outside of the clinic excluding No Patient Has Alerts: No cellular tissue based products placed in the center since last visit: Pain Present Now: No Electronic Signature(s) Signed: 04/27/2023 4:07:29 PM By: [name] Entered By: [name] on 04/27/2023 15:55:48 -------------------------------------------------------------------------------- Encounter Discharge Information Details Patient Name: Date of Service: [name], [name] 04/27/2023 1:30 PM Medical Record Number: [id number] Patient Account Number: [id number] Date of Birth/Sex: Treating RN: 1951-02-21 (72 y.o. M) Primary Care Arlissa Monteverde: [name] Other Clinician: Referring Beniah Magnan: Treating [place]oah Mcnally/Extender: [name] in Treatment: 12 Encounter Discharge Information Items Discharge Condition: Stable Ambulatory Status: Cane Discharge Destination:  Home Transportation: Private Auto Accompanied By: self Schedule Follow-up Appointment: Yes Clinical Summary of Care: BUNYAN, BRIER ([id number]) [id number] Page 2 of 2 Electronic Signature(s) Signed: 04/27/2023 4:07:29 PM By: [name] Entered By: [name] on 04/27/2023 15:57:18 -------------------------------------------------------------------------------- Vitals Details Patient Name: Date of Service: [name], [name] 04/27/2023 1:30 PM Medical Record Number: [id number] Patient Account Number: [id number] Date of Birth/Sex: Treating RN: 1951/01/08 (72 y.o. M) Primary Care [place] Ganoe: [name] Other Clinician: Referring Darrah Dredge: Treating Che Rachal/Extender: [name] in Treatment: 12 Vital Signs Time Taken: 01:30 Temperature (F): 98.0 Height (in): 70 Pulse (bpm): 82 Weight (lbs): 302 Respiratory Rate (breaths/min): 18 Body Mass Index (BMI): 43.3 Blood Pressure (mmHg): 122/64 Capillary Blood Glucose (mg/dl): 244 Reference Range: 80 - 120 mg / dl Airway Pulse Oximetry (%): 96 Electronic Signature(s) Signed: 04/27/2023 4:07:29 PM By: [name] Entered By: [name] on 04/27/2023 15:55:50

## 2023-04-28 ENCOUNTER — Encounter: Payer: Medicare Other | Admitting: Physician Assistant

## 2023-04-28 LAB — TROPONIN I (HIGH SENSITIVITY): Troponin I (High Sensitivity): 14 ng/L (ref <18)

## 2023-04-28 NOTE — Discharge Instructions (Signed)
Please call your cardiology clinic in the morning to get an appointment with them within the next 1 to 2 days  Return to the ER right away if any of your symptoms come back such as chest pain, you feel weak, you have a lightheaded or pass out, or other concerns arise.

## 2023-04-28 NOTE — ED Provider Notes (Signed)
[facility] Provider Note    Event Date/Time   First MD Initiated Contact with Patient 04/27/23 2357     (approximate)   History   Chest Pain   HPI  Lawrence [name] is a 72 y.o. male history of previous MI, coronary artery disease and bypass, type 1 diabetes coronary disease   Patient was at home, and he was at his house and experienced discomfort in his chest felt like a pressure sensation lasted a couple hours.  Called EMS and it went away after the second dose of nitroglycerin.  Reports all pain and symptoms have been gone now since has been in the ER.  He has a history of multiple cardiac blockages and a previous bypass and used to see Dr. Gwen Pounds but now sees Dr. Cassie Freer and [facility].  Feels fine now.  No associate abdominal pain.  No shortness of breath.  Chronic lower extremity swelling without change.  No history of blood clots.  He experienced no short of breath during the episode  He has had similar episodes of chest discomfort in the past, has been told by his cardiologist that he has a history of angina and takes nitroglycerin  Physical Exam   Triage Vital Signs: ED Triage Vitals  Enc Vitals Group     BP 04/27/23 2047 (!) 141/65     Pulse Rate 04/27/23 2047 86     Resp 04/27/23 2047 20     Temp 04/27/23 2047 98 F (36.7 C)     Temp Source 04/27/23 2047 Oral     SpO2 04/27/23 2047 100 %     Weight 04/27/23 2049 295 lb (133.8 kg)     Height 04/27/23 2049 5\' 10"  (1.778 m)     Head Circumference --      Peak Flow --      Pain Score 04/27/23 2044 3     Pain Loc --      Pain Edu? --      Excl. in GC? --     Most recent vital signs: Vitals:   04/28/23 0001 04/28/23 0030  BP: (!) 104/59 (!) 115/53  Pulse: 73 81  Resp: 17 14  Temp: 98.1 F (36.7 C)   SpO2: 100% 100%     General: Awake, no distress.  Currently reports pain and symptom free CV:  Good peripheral perfusion.  Normal tones and rate Resp:  Normal effort.  Clear  lungs bilateral Abd:  No distention.  Soft nontender.  Negative Murphy Other:  Moderate bilateral lower extremity swelling, equal   ED Results / Procedures / Treatments   Labs (all labs ordered are listed, but only abnormal results are displayed) Labs Reviewed  COMPREHENSIVE METABOLIC PANEL - Abnormal; Notable for the following components:      Result Value   Sodium 133 (*)    Glucose, Bld 357 (*)    BUN 26 (*)    Calcium 8.5 (*)    All other components within normal limits  CBC WITH DIFFERENTIAL/PLATELET - Abnormal; Notable for the following components:   RBC 3.95 (*)    Hemoglobin 12.5 (*)    HCT 38.5 (*)    All other components within normal limits  LIPASE, BLOOD  TROPONIN I (HIGH SENSITIVITY)  TROPONIN I (HIGH SENSITIVITY)     EKG  EKG interpreted by me at 2045 heart rate 85 QRS 80 QTc 450 Normal sinus rhythm, no evidence of acute ischemia.  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  No  significant change in morphology when compared to previous EKG from Dr. Strandquist Sink   RADIOLOGY  Chest x-ray interpreted by me as negative for acute finding   DG Chest Port 1 View  Result Date: 04/27/2023 CLINICAL DATA:  Epigastric chest pain for 2 hours, previous tobacco abuse EXAM: PORTABLE CHEST 1 VIEW COMPARISON:  02/13/2023 FINDINGS: Single frontal view of the chest demonstrates postsurgical changes from CABG. Cardiac silhouette is unremarkable. No acute airspace disease, effusion, or pneumothorax. No acute bony abnormalities. IMPRESSION: 1. No acute intrathoracic process. Electronically Signed   By: Sharlet Salina M.D.   On: 04/27/2023 21:40      PROCEDURES:  Critical Care performed: No  Procedures   MEDICATIONS ORDERED IN ED: Medications - No data to display   IMPRESSION / MDM / ASSESSMENT AND PLAN / ED COURSE  I reviewed the triage vital signs and the nursing notes.                              Differential diagnosis includes, but is not limited to, ACS, angina,  pneumothorax, dissection, PE, referred abdominal pain etc.  No dyspnea hypoxia or tachycardia unlikely represent PE.  Patient also reports atypical symptoms he reports the chest pressure alleviated by nitroglycerin.  He has a history of angina.  Given his presentation today his symptoms seem to be most consistent with angina and his discomfort has been fully alleviated now.  Patient's presentation is most consistent with acute complicated illness / injury requiring diagnostic workup.  Labs reviewed notable for mild hyponatremia, elevated glucose of 357.  No evidence of DKA normal CO2.  Normal anion gap.  Initial troponin 9, second troponin 14.  Hemoglobin with normal white count and platelet count anemia 12.5   The patient is on the cardiac monitor to evaluate for evidence of arrhythmia and/or significant heart rate changes.   ----------------------------------------- 12:42 AM on 04/28/2023 ----------------------------------------- Spent an extensive amount of time counseling the patient as well as his wife at the bedside.  I strongly recommended to be admitted or observed overnight for further concerns that he might be at risk of having a major heart attack.  However he reports that he does not wish to stay in the hospital overnight, he wishes to go home, he will come back right away if symptoms worsen, and he will be contacting his cardiologist office in the morning.  I again strongly encouraged him to stay with concern that he could have something like a heart attack about to happen, we discussed the many known blockages that he has of his heart arteries, but even after this the patient reports all symptoms are gone and he wanted to be checked out and wants to be able to sleep in his own home and does not wish to be admitted.  With shared medical decision making, careful return plan in place including adherence to his current medications, return right away if any concerning symptoms arise including  recurrence of chest pain both he and his wife are agreeable and will be contacting his cardiologist for follow-up morning.  Return precautions and treatment recommendations and follow-up discussed with the patient who is agreeable with the plan.   FINAL CLINICAL IMPRESSION(S) / ED DIAGNOSES   Final diagnoses:  Coronary artery disease involving native coronary artery of native heart with angina pectoris (HCC)  Moderate risk chest pain     Rx / DC Orders   ED Discharge Orders  Ordered    Ambulatory referral to Cardiology       Comments: Needs follow-up, concern for developing unstable angina.   04/28/23 0038             Note:  This document was prepared using Dragon voice recognition software and may include unintentional dictation errors.   Sharyn Creamer, MD 04/28/23 [phone]

## 2023-04-29 ENCOUNTER — Telehealth: Payer: Self-pay

## 2023-04-29 ENCOUNTER — Encounter (HOSPITAL_BASED_OUTPATIENT_CLINIC_OR_DEPARTMENT_OTHER): Payer: Medicare Other | Admitting: Internal Medicine

## 2023-04-29 DIAGNOSIS — E10621 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: Secondary | ICD-10-CM

## 2023-04-29 DIAGNOSIS — L97522 Non-pressure chronic ulcer of other part of left foot with fat layer exposed: Secondary | ICD-10-CM

## 2023-04-29 DIAGNOSIS — M86371 Chronic multifocal osteomyelitis, right ankle and foot: Secondary | ICD-10-CM

## 2023-04-29 LAB — GLUCOSE, CAPILLARY
Glucose-Capillary: 291 mg/dL — ABNORMAL HIGH (ref 70–99)
Glucose-Capillary: 234 mg/dL — ABNORMAL HIGH (ref 70–99)

## 2023-04-29 NOTE — Transitions of Care (Post Inpatient/ED Visit) (Signed)
   04/29/2023  Name: [name] MRN: [id number] DOB: [birth year]  Today's TOC FU Call Status: Today's TOC FU Call Status:: Unsuccessul Call (1st Attempt) Unsuccessful Call (1st Attempt) Date: 04/29/23  Attempted to reach the patient regarding the most recent Inpatient/ED visit.  Follow Up Plan: Additional outreach attempts will be made to reach the patient to complete the Transitions of Care (Post Inpatient/ED visit) call.   Signature   Woodfin Ganja LPN [facility] Nurse Health Advisor Direct Dial [phone]

## 2023-04-29 NOTE — Progress Notes (Signed)
[name] ([id number]) [id number] Page 1 of 2 Visit Report for 04/29/2023 HBO Details Patient Name: Date of Service: [name], [name] 04/29/2023 1:30 PM Medical Record Number: [id number] Patient Account Number: [id number] Date of Birth/Sex: Treating RN: 05-31-1951 (72 y.o. M) Primary Care Darrik Richman: [name] Other Clinician: Referring Tynan Boesel: Treating Brityn Mastrogiovanni/Extender: Emilee Hero in Treatment: 12 HBO Treatment Course Details Treatment Course Number: 1 Ordering Katesha Eichel: [name] T Treatments Ordered: otal 40 HBO Treatment Start Date: 02/24/2023 HBO Indication: Other (specify in Notes) HBO Treatment Details Treatment Number: 36 Patient Type: Outpatient Chamber Type: Monoplace Chamber Serial #: [id number] Treatment Protocol: 2.0 ATA with 90 minutes oxygen, and no air breaks Treatment Details Compression Rate Down: 2.0 psi / minute De-Compression Rate Up: 1.5 psi / minute Air breaks and breathing Decompress Decompress Compress Tx Pressure Begins Reached periods Begins Ends (leave unused spaces blank) Chamber Pressure (ATA 1 2 ------2 1 ) Clock Time (24 hr) 13:47 13:57 - - - - - - 15:28 15:37 Treatment Length: 110 (minutes) Treatment Segments: 4 Vital Signs Capillary Blood Glucose Reference Range: 80 - 120 mg / dl HBO Diabetic Blood Glucose Intervention Range: <131 mg/dl or >595 mg/dl Type: Time Vitals Blood Respiratory Capillary Blood Glucose Pulse Action Pulse: Temperature: Taken: Pressure: Rate: Glucose (mg/dl): Meter #: Oximetry (%) Taken: Pre 01:35 142/60 76 18 97.8 291 1 99 none per protocol Post 15:37 140/68 74 18 98 234 1 99 none per protocol Treatment Response Treatment Toleration: Well Treatment Completion Status: Treatment Completed without Adverse Event HBO Attestation I certify that I supervised this HBO treatment in accordance with Medicare guidelines. A trained emergency response  team is readily available per Yes hospital policies and procedures. Continue HBOT as ordered. Yes Electronic Signature(s) Signed: 04/29/2023 4:25:37 PM By: Geralyn Corwin DO Previous Signature: 04/29/2023 3:55:39 PM Version By: [name] Entered By: Geralyn Corwin on 04/29/2023 16:24:44 Abarca, [name] ([id number]) [id number] Page 2 of 2 -------------------------------------------------------------------------------- HBO Safety Checklist Details Patient Name: Date of Service: [name], [name] 04/29/2023 1:30 PM Medical Record Number: [id number] Patient Account Number: [id number] Date of Birth/Sex: Treating RN: 1951-10-07 (72 y.o. M) Primary Care Thy Gullikson: [name] Other Clinician: Referring Ashlei Chinchilla: Treating Tate Jerkins/Extender: Emilee Hero in Treatment: 12 HBO Safety Checklist Items Safety Checklist Consent Form Signed Patient voided / foley secured and emptied When did you last eato 04/29/23 am Last dose of injectable or oral agent 04/29/23 glucogan Ostomy pouch emptied and vented if applicable NA All implantable devices assessed, documented and approved NA Intravenous access site secured and place NA Valuables secured Linens and cotton and cotton/polyester blend (less than 51% polyester) Personal oil-based products / skin lotions / body lotions removed Wigs or hairpieces removed NA Smoking or tobacco materials removed NA Books / newspapers / magazines / loose paper removed Cologne, aftershave, perfume and deodorant removed Jewelry removed (may wrap wedding band) Make-up removed NA Hair care products removed Battery operated devices (external) removed NA Heating patches and chemical warmers removed NA Titanium eyewear removed NA Nail polish cured greater than 10 hours NA Casting material cured greater than 10 hours NA Hearing aids removed NA Loose dentures or partials removed NA Prosthetics have  been removed NA Patient demonstrates correct use of air break device (if applicable) Patient concerns have been addressed Patient grounding bracelet on and cord attached to chamber Specifics for Inpatients (complete in addition to above) Medication sheet sent with patient Intravenous medications needed or  due during therapy sent with patient Drainage tubes (e.g. nasogastric tube or chest tube secured and vented) Endotracheal or Tracheotomy tube secured Cuff deflated of air and inflated with saline Airway suctioned Electronic Signature(s) Signed: 04/29/2023 3:55:39 PM By: [name] Entered By: [name] on 04/29/2023 15:53:25

## 2023-04-29 NOTE — Progress Notes (Signed)
Lawrence [name] ([id number]) [id number] Page 1 of 2 Visit Report for 04/29/2023 Arrival Information Details Patient Name: Date of Service: [name], [name] 04/29/2023 1:30 PM Medical Record Number: [id number] Patient Account Number: [id number] Date of Birth/Sex: Treating RN: 09-13-1951 (72 y.o. M) Primary Care Hershel Corkery: [name] Other Clinician: Referring Marc Leichter: Treating Iyona Pehrson/Extender: Emilee Hero in Treatment: 12 Visit Information History Since Last Visit Added or deleted any medications: No Patient Arrived: Cane Any new allergies or adverse reactions: No Arrival Time: 13:55 Had a fall or experienced change in No Accompanied By: self activities of daily living that may affect Transfer Assistance: None risk of falls: Patient Identification Verified: Yes Signs or symptoms of abuse/neglect since last visito No Secondary Verification Process Completed: Yes Hospitalized since last visit: No Patient Requires Transmission-Based Precautions: No Implantable device outside of the clinic excluding No Patient Has Alerts: No cellular tissue based products placed in the center since last visit: Pain Present Now: No Electronic Signature(s) Signed: 04/29/2023 3:55:39 PM By: [name] Entered By: [name] on 04/29/2023 15:53:19 -------------------------------------------------------------------------------- Encounter Discharge Information Details Patient Name: Date of Service: [name], [name] 04/29/2023 1:30 PM Medical Record Number: [id number] Patient Account Number: [id number] Date of Birth/Sex: Treating RN: [name], 1951 (73 y.o. M) Primary Care Royal Vandevoort: [name] Other Clinician: Referring Rejoice Heatwole: Treating Mackey Varricchio/Extender: Emilee Hero in Treatment: 12 Encounter Discharge Information Items Discharge Condition: Stable Ambulatory Status: Ambulatory Discharge  Destination: Home Transportation: Private Auto Accompanied By: self Schedule Follow-up Appointment: Yes Clinical Summary of Care: Lawrence [name], Lawrence [name] ([id number]) [id number] Page 2 of 2 Electronic Signature(s) Signed: 04/29/2023 3:55:39 PM By: [name] Entered By: [name] on 04/29/2023 15:54:41 -------------------------------------------------------------------------------- Vitals Details Patient Name: Date of Service: [name], [name] 04/29/2023 1:30 PM Medical Record Number: [id number] Patient Account Number: [id number] Date of Birth/Sex: Treating RN: [birth year] (72 y.o. M) Primary Care Celso Granja: [name] Other Clinician: Referring Nariyah Osias: Treating Dejour Vos/Extender: Emilee Hero in Treatment: 12 Vital Signs Time Taken: 01:35 Temperature (F): 97.8 Height (in): 70 Pulse (bpm): 76 Weight (lbs): 302 Respiratory Rate (breaths/min): 18 Body Mass Index (BMI): 43.3 Blood Pressure (mmHg): 142/60 Capillary Blood Glucose (mg/dl): 536 Reference Range: 80 - 120 mg / dl Airway Pulse Oximetry (%): 99 Electronic Signature(s) Signed: 04/29/2023 3:55:39 PM By: [name] Entered By: [name] on 04/29/2023 15:53:22

## 2023-04-30 ENCOUNTER — Encounter: Payer: Medicare Other | Admitting: Physician Assistant

## 2023-04-30 DIAGNOSIS — E10621 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: Secondary | ICD-10-CM | POA: Diagnosis not present

## 2023-04-30 LAB — GLUCOSE, CAPILLARY
Glucose-Capillary: 355 mg/dL — ABNORMAL HIGH (ref 70–99)
Glucose-Capillary: 378 mg/dL — ABNORMAL HIGH (ref 70–99)

## 2023-04-30 NOTE — Progress Notes (Signed)
Lawrence [name] ([id number]) [id number] Page 1 of 2 Visit Report for 04/30/2023 Arrival Information Details Patient Name: Date of Service: [name], [name] 04/30/2023 11:15 A M Medical Record Number: [id number] Patient Account Number: [id number] Date of Birth/Sex: Treating RN: [birth year] (72 y.o. M) Primary Care Ren Grasse: [name] Other Clinician: Referring Ara Grandmaison: Treating Jhoanna Heyde/Extender: [name] in Treatment: 12 Visit Information History Since Last Visit Added or deleted any medications: No Patient Arrived: Cane Any new allergies or adverse reactions: No Arrival Time: 12:10 Had a fall or experienced change in No Accompanied By: self activities of daily living that may affect Transfer Assistance: None risk of falls: Patient Identification Verified: Yes Signs or symptoms of abuse/neglect since last visito No Secondary Verification Process Completed: Yes Hospitalized since last visit: No Patient Requires Transmission-Based Precautions: No Implantable device outside of the clinic excluding No Patient Has Alerts: No cellular tissue based products placed in the center since last visit: Pain Present Now: No Electronic Signature(s) Signed: 04/30/2023 2:22:27 PM By: [name] Entered By: [name] on 04/30/2023 13:13:05 -------------------------------------------------------------------------------- Encounter Discharge Information Details Patient Name: Date of Service: [name], [name] 04/30/2023 11:15 A M Medical Record Number: [id number] Patient Account Number: [id number] Date of Birth/Sex: Treating RN: 04-21-1951 (72 y.o. M) Primary Care Karyna Bessler: [name] Other Clinician: Referring Anjanae Woehrle: Treating Bryen Hinderman/Extender: [name] in Treatment: 12 Encounter Discharge Information Items Discharge Condition: Stable Ambulatory Status: Cane Discharge Destination:  Home Transportation: Private Auto Accompanied By: self Schedule Follow-up Appointment: Yes Clinical Summary of Care: Lawrence [name], Lawrence [name] ([id number]) [id number] Page 2 of 2 Electronic Signature(s) Signed: 04/30/2023 2:22:27 PM By: [name] Entered By: [name] on 04/30/2023 13:14:40 -------------------------------------------------------------------------------- Vitals Details Patient Name: Date of Service: [name], [name] 04/30/2023 11:15 A M Medical Record Number: [id number] Patient Account Number: [id number] Date of Birth/Sex: Treating RN: 17-May-1951 (72 y.o. M) Primary Care Madicyn Mesina: [name] Other Clinician: Referring Any Mcneice: Treating Dezirea Mccollister/Extender: [name] in Treatment: 12 Vital Signs Time Taken: 11:00 Temperature (F): 97.9 Height (in): 70 Pulse (bpm): 64 Weight (lbs): 302 Respiratory Rate (breaths/min): 18 Body Mass Index (BMI): 43.3 Blood Pressure (mmHg): 130/80 Capillary Blood Glucose (mg/dl): 433 Reference Range: 80 - 120 mg / dl Airway Pulse Oximetry (%): 99 Electronic Signature(s) Signed: 04/30/2023 2:22:27 PM By: [name] Entered By: [name] on 04/30/2023 13:13:08

## 2023-04-30 NOTE — Transitions of Care (Post Inpatient/ED Visit) (Signed)
   04/30/2023  Name: Lawrence [name] MRN: [id number] DOB: April 29, 1951  Today's TOC FU Call Status: Today's TOC FU Call Status:: Unsuccessful Call (2nd Attempt) Unsuccessful Call (1st Attempt) Date: 04/29/23 Unsuccessful Call (2nd Attempt) Date: 04/30/23  Attempted to reach the patient regarding the most recent Inpatient/ED visit.  Follow Up Plan: Additional outreach attempts will be made to reach the patient to complete the Transitions of Care (Post Inpatient/ED visit) call.   Signature   Woodfin Ganja LPN [facility] Nurse Health Advisor Direct Dial [phone]

## 2023-05-01 ENCOUNTER — Encounter: Payer: Medicare Other | Admitting: Physician Assistant

## 2023-05-01 DIAGNOSIS — E10621 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: Secondary | ICD-10-CM | POA: Diagnosis not present

## 2023-05-01 NOTE — Progress Notes (Signed)
[name] ([id number]) [id number] Page 1 of 2 Visit Report for 04/30/2023 HBO Details Patient Name: Date of Service: [name], [name] 04/30/2023 11:15 A M Medical Record Number: [id number] Patient Account Number: [id number] Date of Birth/Sex: Treating RN: Dec 14, 1950 (72 y.o. M) Primary Care Brandelyn Henne: [name] Other Clinician: Referring Atiya Yera: Treating Jaylah Goodlow/Extender: [name] in Treatment: 12 HBO Treatment Course Details Treatment Course Number: 1 Ordering Caidance Sybert: [name] T Treatments Ordered: otal 40 HBO Treatment Start Date: 02/24/2023 HBO Indication: Other (specify in Notes) HBO Treatment Details Treatment Number: 37 Patient Type: Outpatient Chamber Type: Monoplace Chamber Serial #: [id number] Treatment Protocol: 2.0 ATA with 90 minutes oxygen, and no air breaks Treatment Details Compression Rate Down: 2.0 psi / minute De-Compression Rate Up: 1.5 psi / minute Air breaks and breathing Decompress Decompress Compress Tx Pressure Begins Reached periods Begins Ends (leave unused spaces blank) Chamber Pressure (ATA 1 2 ------2 1 ) Clock Time (24 hr) 11:10 11:20 - - - - - - 12:51 13:00 Treatment Length: 110 (minutes) Treatment Segments: 4 Vital Signs Capillary Blood Glucose Reference Range: 80 - 120 mg / dl HBO Diabetic Blood Glucose Intervention Range: <131 mg/dl or >782 mg/dl Type: Time Vitals Blood Respiratory Capillary Blood Glucose Pulse Action Pulse: Temperature: Taken: Pressure: Rate: Glucose (mg/dl): Meter #: Oximetry (%) Taken: Pre 11:00 130/80 64 18 97.9 355 1 99 none per protocol Post 13:00 130/80 68 18 98 378 1 98 none per protocol Treatment Response Treatment Toleration: Well Treatment Completion Status: Treatment Completed without Adverse Event Electronic Signature(s) Signed: 04/30/2023 2:22:27 PM By: [name] Signed: 05/01/2023 1:40:48 PM By: [name] Entered By:  [name] on 04/30/2023 13:14:08 Pring, [name] ([id number]) [id number] Page 2 of 2 -------------------------------------------------------------------------------- HBO Safety Checklist Details Patient Name: Date of Service: [name], [name] 04/30/2023 11:15 A M Medical Record Number: [id number] Patient Account Number: [id number] Date of Birth/Sex: Treating RN: January 20, 1951 (72 y.o. M) Primary Care Vihana Kydd: [name] Other Clinician: Referring Emeri Estill: Treating Violia Knopf/Extender: [name] in Treatment: 12 HBO Safety Checklist Items Safety Checklist Consent Form Signed Patient voided / foley secured and emptied When did you last eato 04/30/23 Last dose of injectable or oral agent 04/30/23 glucogan Ostomy pouch emptied and vented if applicable NA All implantable devices assessed, documented and approved NA Intravenous access site secured and place NA Valuables secured Linens and cotton and cotton/polyester blend (less than 51% polyester) Personal oil-based products / skin lotions / body lotions removed Wigs or hairpieces removed NA Smoking or tobacco materials removed NA Books / newspapers / magazines / loose paper removed Cologne, aftershave, perfume and deodorant removed Jewelry removed (may wrap wedding band) Make-up removed NA Hair care products removed Battery operated devices (external) removed NA Heating patches and chemical warmers removed NA Titanium eyewear removed NA Nail polish cured greater than 10 hours NA Casting material cured greater than 10 hours NA Hearing aids removed NA Loose dentures or partials removed NA Prosthetics have been removed NA Patient demonstrates correct use of air break device (if applicable) Patient concerns have been addressed Patient grounding bracelet on and cord attached to chamber Specifics for Inpatients (complete in addition to above) Medication sheet sent  with patient Intravenous medications needed or due during therapy sent with patient Drainage tubes (e.g. nasogastric tube or chest tube secured and vented) Endotracheal or Tracheotomy tube secured Cuff deflated of air and inflated with saline Airway suctioned Electronic Signature(s) Signed:  04/30/2023 2:22:27 PM By: [name] Entered By: [name] on 04/30/2023 13:13:11

## 2023-05-01 NOTE — Progress Notes (Signed)
Lawrence [name] ([id number]) [id number] Page 1 of 3 Visit Report for 05/01/2023 Arrival Information Details Patient Name: Date of Service: [name], [name] 05/01/2023 10:00 A M Medical Record Number: [id number] Patient Account Number: [id number] Date of Birth/Sex: Treating RN: 1951-07-16 (72 y.o. M) Primary Care Aspynn Clover: [name] Other Clinician: Referring Math Brazie: Treating Anahli Arvanitis/Extender: [name] in Treatment: 13 Visit Information History Since Last Visit Added or deleted any medications: No Patient Arrived: Cane Any new allergies or adverse reactions: No Arrival Time: 11:09 Had a fall or experienced change in No Accompanied By: wife activities of daily living that may affect Transfer Assistance: None risk of falls: Patient Identification Verified: Yes Signs or symptoms of abuse/neglect since last visito No Secondary Verification Process Completed: Yes Hospitalized since last visit: No Patient Requires Transmission-Based Precautions: No Implantable device outside of the clinic excluding No Patient Has Alerts: No cellular tissue based products placed in the center since last visit: Pain Present Now: No Electronic Signature(s) Signed: 05/01/2023 11:33:47 AM By: [name] Entered By: [name] on 05/01/2023 11:32:09 -------------------------------------------------------------------------------- Clinic Level of Care Assessment Details Patient Name: Date of Service: [name], [name] 05/01/2023 10:00 A M Medical Record Number: [id number] Patient Account Number: [id number] Date of Birth/Sex: Treating RN: 02/06/1951 (72 y.o. M) Primary Care Keyonni Percival: [name] Other Clinician: Referring Ramiro Pangilinan: Treating Elliet Goodnow/Extender: [name] in Treatment: 13 Clinic Level of Care Assessment Items TOOL 4 Quantity Score []  - 0 Use when only an EandM is performed on FOLLOW-UP  visit ASSESSMENTS - Nursing Assessment / Reassessment []  - 0 Reassessment of Co-morbidities (includes updates in patient status) []  - 0 Reassessment of Adherence to Treatment Plan ASSESSMENTS - Wound and Skin Assessment / Reassessment JAE, PETCH ([id number]) [id number] Page 2 of 3 []  - 0 Simple Wound Assessment / Reassessment - one wound []  - 0 Complex Wound Assessment / Reassessment - multiple wounds []  - 0 Dermatologic / Skin Assessment (not related to wound area) ASSESSMENTS - Focused Assessment []  - 0 Circumferential Edema Measurements - multi extremities []  - 0 Nutritional Assessment / Counseling / Intervention []  - 0 Lower Extremity Assessment (monofilament, tuning fork, pulses) []  - 0 Peripheral Arterial Disease Assessment (using hand held doppler) ASSESSMENTS - Ostomy and/or Continence Assessment and Care []  - 0 Incontinence Assessment and Management []  - 0 Ostomy Care Assessment and Management (repouching, etc.) PROCESS - Coordination of Care []  - 0 Simple Patient / Family Education for ongoing care []  - 0 Complex (extensive) Patient / Family Education for ongoing care X- 1 10 Staff obtains Chiropractor, Records, T Results / Process Orders est []  - 0 Staff telephones HHA, Nursing Homes / Clarify orders / etc []  - 0 Routine Transfer to another Facility (non-emergent condition) []  - 0 Routine Hospital Admission (non-emergent condition) []  - 0 New Admissions / Manufacturing engineer / Ordering NPWT Apligraf, etc. , []  - 0 Emergency Hospital Admission (emergent condition) []  - 0 Simple Discharge Coordination []  - 0 Complex (extensive) Discharge Coordination PROCESS - Special Needs []  - 0 Pediatric / Minor Patient Management []  - 0 Isolation Patient Management []  - 0 Hearing / Language / Visual special needs []  - 0 Assessment of Community assistance (transportation, D/C planning, etc.) []  - 0 Additional assistance /  Altered mentation []  - 0 Support Surface(s) Assessment (bed, cushion, seat, etc.) INTERVENTIONS - Wound Cleansing / Measurement []  - 0 Simple Wound Cleansing - one wound []  - 0 Complex  Wound Cleansing - multiple wounds []  - 0 Wound Imaging (photographs - any number of wounds) []  - 0 Wound Tracing (instead of photographs) []  - 0 Simple Wound Measurement - one wound []  - 0 Complex Wound Measurement - multiple wounds INTERVENTIONS - Wound Dressings []  - 0 Small Wound Dressing one or multiple wounds []  - 0 Medium Wound Dressing one or multiple wounds []  - 0 Large Wound Dressing one or multiple wounds []  - 0 Application of Medications - topical []  - 0 Application of Medications - injection INTERVENTIONS - Miscellaneous []  - 0 External ear exam []  - 0 Specimen Collection (cultures, biopsies, blood, body fluids, etc.) Lawrence [name] ([id number]) [id number] Page 3 of 3 []  - 0 Specimen(s) / Culture(s) sent or taken to Lab for analysis []  - 0 Patient Transfer (multiple staff / Nurse, adult / Similar devices) []  - 0 Simple Staple / Suture removal (25 or less) []  - 0 Complex Staple / Suture removal (26 or more) []  - 0 Hypo / Hyperglycemic Management (close monitor of Blood Glucose) []  - 0 Ankle / Brachial Index (ABI) - do not check if billed separately []  - 0 Vital Signs Has the patient been seen at the hospital within the last three years: Yes Total Score: 10 Level Of Care: New/Established - Level 1 Electronic Signature(s) Signed: 05/01/2023 11:33:47 AM By: [name] Entered By: [name] on 05/01/2023 11:33:12 -------------------------------------------------------------------------------- Encounter Discharge Information Details Patient Name: Date of Service: [name], [name] 05/01/2023 10:00 A M Medical Record Number: [id number] Patient Account Number: [id number] Date of Birth/Sex: Treating RN: Mar 23, 1951 (72 y.o. M) Primary Care  Coston Mandato: [name] Other Clinician: Referring Meleena Munroe: Treating Cloria Ciresi/Extender: [name] in Treatment: 13 Encounter Discharge Information Items Discharge Condition: Stable Ambulatory Status: Cane Discharge Destination: Home Transportation: Private Auto Accompanied By: wife Schedule Follow-up Appointment: Yes Clinical Summary of Care: Electronic Signature(s) Signed: 05/01/2023 11:33:47 AM By: [name] Entered By: [name] on 05/01/2023 11:29:15

## 2023-05-01 NOTE — Progress Notes (Addendum)
Lawrence [name] ([id number]) [id number] Page 1 of 11 Visit Report for 05/01/2023 Arrival Information Details Patient Name: Date of Service: A DA [name], Lawrence RLA ND W. 05/01/2023 9:00 A M Medical Record Number: [id number] Patient Account Number: [id number] Date of Birth/Sex: Treating RN: August 03, 1951 (72 y.o. [name]) [name] Primary Care Arisa Congleton: [name] Other Clinician: Referring Naresh Althaus: Treating Von Inscoe/Extender: [name] in Treatment: 13 Visit Information History Since Last Visit Added or deleted any medications: No Patient Arrived: Ambulatory Any new allergies or adverse reactions: No Arrival Time: 09:22 Had a fall or experienced change in No Accompanied By: wife activities of daily living that may affect Transfer Assistance: None risk of falls: Patient Identification Verified: Yes Signs or symptoms of abuse/neglect since last visito No Secondary Verification Process Completed: Yes Hospitalized since last visit: No Patient Requires Transmission-Based Precautions: No Implantable device outside of the clinic excluding No Patient Has Alerts: No cellular tissue based products placed in the center since last visit: Has Dressing in Place as Prescribed: Yes Pain Present Now: No Electronic Signature(s) Signed: 05/01/2023 11:42:03 AM By: [name] RN Entered By: [name] on 05/01/2023 09:23:18 -------------------------------------------------------------------------------- Clinic Level of Care Assessment Details Patient Name: Date of Service: A DA [name], Lawrence RLA ND W. 05/01/2023 9:00 A M Medical Record Number: [id number] Patient Account Number: [id number] Date of Birth/Sex: Treating RN: 02/14/1951 (72 y.o. [name]) [name] Primary Care Takeru Bose: [name] Other Clinician: Referring Afreen Siebels: Treating Hartman Minahan/Extender: [name] in Treatment: 13 Clinic Level of Care Assessment Items TOOL 1  Quantity Score []  - 0 Use when EandM and Procedure is performed on INITIAL visit ASSESSMENTS - Nursing Assessment / Reassessment []  - 0 General Physical Exam (combine w/ comprehensive assessment (listed just below) when performed on new pt. evals) []  - 0 Comprehensive Assessment (HX, ROS, Risk Assessments, Wounds Hx, etc.) Lawrence [name], Lawrence [name] ([id number]) [id number] Page 2 of 11 ASSESSMENTS - Wound and Skin Assessment / Reassessment []  - 0 Dermatologic / Skin Assessment (not related to wound area) ASSESSMENTS - Ostomy and/or Continence Assessment and Care []  - 0 Incontinence Assessment and Management []  - 0 Ostomy Care Assessment and Management (repouching, etc.) PROCESS - Coordination of Care []  - 0 Simple Patient / Family Education for ongoing care []  - 0 Complex (extensive) Patient / Family Education for ongoing care []  - 0 Staff obtains Chiropractor, Records, T Results / Process Orders est []  - 0 Staff telephones HHA, Nursing Homes / Clarify orders / etc []  - 0 Routine Transfer to another Facility (non-emergent condition) []  - 0 Routine Hospital Admission (non-emergent condition) []  - 0 New Admissions / Manufacturing engineer / Ordering NPWT Apligraf, etc. , []  - 0 Emergency Hospital Admission (emergent condition) PROCESS - Special Needs []  - 0 Pediatric / Minor Patient Management []  - 0 Isolation Patient Management []  - 0 Hearing / Language / Visual special needs []  - 0 Assessment of Community assistance (transportation, D/C planning, etc.) []  - 0 Additional assistance / Altered mentation []  - 0 Support Surface(s) Assessment (bed, cushion, seat, etc.) INTERVENTIONS - Miscellaneous []  - 0 External ear exam []  - 0 Patient Transfer (multiple staff / Nurse, adult / Similar devices) []  - 0 Simple Staple / Suture removal (25 or less) []  - 0 Complex Staple / Suture removal (26 or more) []  - 0 Hypo/Hyperglycemic Management (do not check if  billed separately) []  - 0 Ankle / Brachial Index (ABI) - do not check if billed separately Has the patient been  seen at the hospital within the last three years: Yes Total Score: 0 Level Of Care: ____ Electronic Signature(s) Signed: 05/01/2023 11:42:03 AM By: [name] RN Entered By: [name] on 05/01/2023 09:45:19 -------------------------------------------------------------------------------- Encounter Discharge Information Details Patient Name: Date of Service: A DA [name], Lawrence RLA ND W. 05/01/2023 9:00 A M Medical Record Number: [id number] Patient Account Number: [id number] Date of Birth/Sex: Treating RN: 1951/07/25 (72 y.o. Lawrence [name] Primary Care Ingvald Theisen: [name] Other Clinician: Referring Haifa Hatton: Treating Suzanne Kho/Extender: [name] in Treatment: [address] Lawrence [name] ([id number]) [id number] Page 3 of 11 Encounter Discharge Information Items Post Procedure Vitals Discharge Condition: Stable Temperature (F): 97.9 Ambulatory Status: Ambulatory Pulse (bpm): 72 Discharge Destination: Home Respiratory Rate (breaths/min): 18 Transportation: Private Auto Blood Pressure (mmHg): 132/59 Accompanied By: wife Schedule Follow-up Appointment: Yes Clinical Summary of Care: Electronic Signature(s) Signed: 05/01/2023 11:42:03 AM By: [name] RN Entered By: [name] on 05/01/2023 09:46:17 -------------------------------------------------------------------------------- Lower Extremity Assessment Details Patient Name: Date of Service: A DA [name], Lawrence RLA ND W. 05/01/2023 9:00 A M Medical Record Number: [id number] Patient Account Number: [id number] Date of Birth/Sex: Treating RN: [birth year] (72 y.o. [name]) [name] Primary Care Kiani Wurtzel: [name] Other Clinician: Referring Coren Crownover: Treating Nadine Ryle/Extender: [name] in Treatment: 13 Vascular Assessment Pulses: Dorsalis  Pedis Palpable: [Left:Yes] [Right:Yes] Electronic Signature(s) Signed: 05/01/2023 11:42:03 AM By: [name] RN Entered By: [name] on 05/01/2023 09:33:19 -------------------------------------------------------------------------------- Multi Wound Chart Details Patient Name: Date of Service: Lawrence [name], Lawrence RLA ND W. 05/01/2023 9:00 A M Medical Record Number: [id number] Patient Account Number: [id number] Date of Birth/Sex: Treating RN: [birth year] (72 y.o. Lawrence [name] Primary Care Zalma Channing: [name] Other Clinician: Referring Brodi Nery: Treating Nekisha Mcdiarmid/Extender: [name] in Treatment: 13 Vital Signs Height(in): 70 Pulse(bpm): 72 Weight(lbs): 302 Blood Pressure(mmHg): 132/59 Body Mass Index(BMI): 43.3 Lawrence [name], Lawrence [name] ([id number]) [id number] Page 4 of 11 Temperature(F): 97.9 Respiratory Rate(breaths/min): 18 [1:Photos:] Right T Second oe Right T Third oe Left T Third oe Wound Location: Gradually Appeared Gradually Appeared Gradually Appeared Wounding Event: Diabetic Wound/Ulcer of the Lower Diabetic Wound/Ulcer of the Lower Diabetic Wound/Ulcer of the Lower Primary Etiology: Extremity Extremity Extremity Glaucoma, Coronary Artery Disease, Glaucoma, Coronary Artery Disease, Glaucoma, Coronary Artery Disease, Comorbid History: Hypertension, Myocardial Infarction, Hypertension, Myocardial Infarction, Hypertension, Myocardial Infarction, Type II Diabetes, End Stage Renal Type II Diabetes, End Stage Renal Type II Diabetes, End Stage Renal Disease Disease Disease 11/03/2022 11/03/2022 12/05/2022 Date Acquired: 13 13 13  Weeks of Treatment: Open Open Open Wound Status: No No No Wound Recurrence: Yes Yes Yes Pending A mputation on Presentation: 0.8x1x0.1 0.2x0.4x0.1 0.5x1x0.2 Measurements L x W x D (cm) 0.628 0.063 0.393 A (cm) : rea 0.063 0.006 0.079 Volume (cm) : 20.00% 86.60% 79.20% % Reduction in A  rea: 73.30% 87.20% 79.00% % Reduction in Volume: Grade 3 Grade 3 Grade 3 Classification: Medium Medium Medium Exudate A mount: Serosanguineous Serosanguineous Serosanguineous Exudate Type: red, brown red, brown red, brown Exudate Color: Distinct, outline attached Distinct, outline attached N/A Wound Margin: Small (1-33%) Large (67-100%) Small (1-33%) Granulation A mount: Pink N/A Pink Granulation Quality: Large (67-100%) None Present (0%) Large (67-100%) Necrotic A mount: Fat Layer (Subcutaneous Tissue): Yes Fat Layer (Subcutaneous Tissue): Yes Fat Layer (Subcutaneous Tissue): Yes Exposed Structures: Bone: Yes Fascia: No Fascia: No Tendon: No Tendon: No Muscle: No Muscle: No Joint: No Joint: No Bone: No Bone: No None None N/A Epithelialization: Treatment Notes Electronic Signature(s) Signed: 05/01/2023 11:42:03  AM By: [name] RN Entered By: [name] on 05/01/2023 09:33:29 -------------------------------------------------------------------------------- Multi-Disciplinary Care Plan Details Patient Name: Date of Service: A DA [name], Lawrence RLA ND W. 05/01/2023 9:00 A M Medical Record Number: [id number] Patient Account Number: [id number] Date of Birth/Sex: Treating RN: 1951-07-26 (72 y.o. [name]) [name] Primary Care Naya Ilagan: [name] Other Clinician: Referring Chrishon Martino: Treating Darris Carachure/Extender: [name] in Treatment: [address], Lawrence [name] ([id number]) [id number] Page 5 of 11 Active Inactive Wound/Skin Impairment Nursing Diagnoses: Knowledge deficit related to ulceration/compromised skin integrity Goals: Patient/caregiver will verbalize understanding of skin care regimen Date Initiated: 01/30/2023 Target Resolution Date: 05/02/2023 Goal Status: Active Ulcer/skin breakdown will have a volume reduction of 30% by week 4 Date Initiated: 01/30/2023 Date Inactivated: 03/06/2023 Target Resolution Date:  03/02/2023 Goal Status: Unmet Unmet Reason: comorbidities Ulcer/skin breakdown will have a volume reduction of 50% by week 8 Date Initiated: 01/30/2023 Date Inactivated: 04/10/2023 Target Resolution Date: 04/01/2023 Goal Status: Unmet Unmet Reason: comorbidities Ulcer/skin breakdown will have a volume reduction of 80% by week 12 Date Initiated: 01/30/2023 Target Resolution Date: 05/02/2023 Goal Status: Active Ulcer/skin breakdown will heal within 14 weeks Date Initiated: 01/30/2023 Target Resolution Date: 06/01/2023 Goal Status: Active Interventions: Assess patient/caregiver ability to obtain necessary supplies Assess patient/caregiver ability to perform ulcer/skin care regimen upon admission and as needed Assess ulceration(s) every visit Notes: Electronic Signature(s) Signed: 05/01/2023 11:42:03 AM By: [name] RN Entered By: [name] on 05/01/2023 09:34:32 -------------------------------------------------------------------------------- Pain Assessment Details Patient Name: Date of Service: Lawrence [name], Lawrence RLA ND W. 05/01/2023 9:00 A M Medical Record Number: [id number] Patient Account Number: [id number] Date of Birth/Sex: Treating RN: November 26, 1950 (72 y.o. Lawrence [name] Primary Care Drewey Begue: [name] Other Clinician: Referring Yarden Manuelito: Treating Zakaiya Lares/Extender: [name] in Treatment: 13 Active Problems Location of Pain Severity and Description of Pain Patient Has Paino No Site Locations Lawrence [name], Lawrence [name] ([id number]) [phone].pdf Page 6 of 11 Pain Management and Medication Current Pain Management: Electronic Signature(s) Signed: 05/01/2023 11:42:03 AM By: [name] RN Entered By: [name] on 05/01/2023 09:24:35 -------------------------------------------------------------------------------- Patient/Caregiver Education Details Patient Name: Date of Service: Lawrence [name], Lawrence RLA ND W. 6/28/2024andnbsp9:00 A  M Medical Record Number: [id number] Patient Account Number: [id number] Date of Birth/Gender: Treating RN: [birth year] (72 y.o. Lawrence [name] Primary Care Physician: [name] Other Clinician: Referring Physician: Treating Physician/Extender: [name] in Treatment: 13 Education Assessment Education Provided To: Patient Education Topics Provided Wound/Skin Impairment: Handouts: Caring for Your Ulcer Methods: Explain/Verbal Responses: State content correctly Electronic Signature(s) Signed: 05/01/2023 11:42:03 AM By: [name] RN Entered By: [name] on 05/01/2023 09:34:46 Gladu, Lawrence [name] ([id number]) [id number] Page 7 of 11 -------------------------------------------------------------------------------- Wound Assessment Details Patient Name: Date of Service: A DA [name], Lawrence RLA ND W. 05/01/2023 9:00 A M Medical Record Number: [id number] Patient Account Number: [id number] Date of Birth/Sex: Treating RN: [birth year] (72 y.o. [name]) [name] Primary Care Maizie Garno: [name] Other Clinician: Referring Coyt Govoni: Treating Deborah Lazcano/Extender: [name] in Treatment: 13 Wound Status Wound Number: 1 Primary Diabetic Wound/Ulcer of the Lower Extremity Etiology: Wound Location: Right T Second oe Wound Open Wounding Event: Gradually Appeared Status: Date Acquired: 11/03/2022 Comorbid Glaucoma, Coronary Artery Disease, Hypertension, Myocardial Weeks Of Treatment: 13 History: Infarction, Type II Diabetes, End Stage Renal Disease Clustered Wound: No Pending Amputation On Presentation Photos Wound Measurements Length: (cm) 0.8 Width: (cm) 1 Depth: (cm) 0.1 Area: (cm) 0.628 Volume: (cm) 0.063 % Reduction in Area:  20% % Reduction in Volume: 73.3% Epithelialization: None Tunneling: No Undermining: No Wound Description Classification: Grade 3 Wound Margin: Distinct, outline attached Exudate  Amount: Medium Exudate Type: Serosanguineous Exudate Color: red, brown Foul Odor After Cleansing: No Slough/Fibrino Yes Wound Bed Granulation Amount: Small (1-33%) Exposed Structure Granulation Quality: Pink Fat Layer (Subcutaneous Tissue) Exposed: Yes Necrotic Amount: Large (67-100%) Bone Exposed: Yes Treatment Notes Wound #1 (Toe Second) Wound Laterality: Right Cleanser Soap and Water Discharge Instruction: Gently cleanse wound with antibacterial soap, rinse and pat dry prior to dressing wounds Peri-Wound Care Topical IASON, Lawrence [name] ([id number]) [id number] Page 8 of 11 Primary Dressing Silvercel Small 2x2 (in/in) Discharge Instruction: Apply Silvercel Small 2x2 (in/in) as instructed Secondary Dressing Coverlet Latex-Free Fabric Adhesive Dressings Discharge Instruction: 1.5 x 2 Secured With Compression Wrap Compression Stockings Add-Ons Electronic Signature(s) Signed: 05/01/2023 11:42:03 AM By: [name] RN Entered By: [name] on 05/01/2023 09:32:06 -------------------------------------------------------------------------------- Wound Assessment Details Patient Name: Date of Service: A DA [name], Lawrence RLA ND W. 05/01/2023 9:00 A M Medical Record Number: [id number] Patient Account Number: [id number] Date of Birth/Sex: Treating RN: 1951/09/20 (72 y.o. [name]) [name] Primary Care Darral Rishel: [name] Other Clinician: Referring Leann Mayweather: Treating Leylanie Woodmansee/Extender: [name] in Treatment: 13 Wound Status Wound Number: 2 Primary Diabetic Wound/Ulcer of the Lower Extremity Etiology: Wound Location: Right T Third oe Wound Open Wounding Event: Gradually Appeared Status: Date Acquired: 11/03/2022 Comorbid Glaucoma, Coronary Artery Disease, Hypertension, Myocardial Weeks Of Treatment: 13 History: Infarction, Type II Diabetes, End Stage Renal Disease Clustered Wound: No Pending Amputation On  Presentation Photos Wound Measurements Length: (cm) 0.2 Width: (cm) 0.4 Depth: (cm) 0.1 Area: (cm) 0.063 Volume: (cm) 0.006 % Reduction in Area: 86.6% % Reduction in Volume: 87.2% Epithelialization: None Tunneling: No Undermining: No Wound Description Classification: Grade 3 Lawrence [name], Lawrence [name] ([id number]) Wound Margin: Distinct, outline attached Exudate Amount: Medium Exudate Type: Serosanguineous Exudate Color: red, brown Foul Odor After Cleansing: No [id number] Page 9 of 11 Slough/Fibrino Yes Wound Bed Granulation Amount: Large (67-100%) Exposed Structure Necrotic Amount: None Present (0%) Fascia Exposed: No Fat Layer (Subcutaneous Tissue) Exposed: Yes Tendon Exposed: No Muscle Exposed: No Joint Exposed: No Bone Exposed: No Treatment Notes Wound #2 (Toe Third) Wound Laterality: Right Cleanser Soap and Water Discharge Instruction: Gently cleanse wound with antibacterial soap, rinse and pat dry prior to dressing wounds Peri-Wound Care Topical Primary Dressing Silvercel Small 2x2 (in/in) Discharge Instruction: Apply Silvercel Small 2x2 (in/in) as instructed Secondary Dressing Coverlet Latex-Free Fabric Adhesive Dressings Discharge Instruction: 1.5 x 2 Secured With Compression Wrap Compression Stockings Add-Ons Electronic Signature(s) Signed: 05/01/2023 11:42:03 AM By: [name] RN Entered By: [name] on 05/01/2023 09:32:39 -------------------------------------------------------------------------------- Wound Assessment Details Patient Name: Date of Service: A DA [name], Lawrence RLA ND W. 05/01/2023 9:00 A M Medical Record Number: [id number] Patient Account Number: [id number] Date of Birth/Sex: Treating RN: 04-15-1951 (72 y.o. [name]) [name] Primary Care Ghada Abbett: [name] Other Clinician: Referring Blakely Gluth: Treating Artez Regis/Extender: [name] in Treatment: 13 Wound Status Wound Number: 4 Primary  Diabetic Wound/Ulcer of the Lower Extremity Etiology: Wound Location: Left T Third oe Wound Open Wounding Event: Gradually Appeared Status: Date Acquired: 12/05/2022 Comorbid Glaucoma, Coronary Artery Disease, Hypertension, Myocardial Weeks Of Treatment: 13 History: Infarction, Type II Diabetes, End Stage Renal Disease Clustered Wound: No Lawrence [name] ([id number]) [id number] Page 10 of 11 Pending Amputation On Presentation Photos Wound Measurements Length: (cm) 0.5 Width: (cm) 1 Depth: (cm) 0.2 Area: (cm) 0.393 Volume: (cm) 0.079 %  Reduction in Area: 79.2% % Reduction in Volume: 79% Tunneling: No Undermining: No Wound Description Classification: Grade 3 Exudate Amount: Medium Exudate Type: Serosanguineous Exudate Color: red, brown Wound Bed Granulation Amount: Small (1-33%) Exposed Structure Granulation Quality: Pink Fascia Exposed: No Necrotic Amount: Large (67-100%) Fat Layer (Subcutaneous Tissue) Exposed: Yes Tendon Exposed: No Muscle Exposed: No Joint Exposed: No Bone Exposed: No Treatment Notes Wound #4 (Toe Third) Wound Laterality: Left Cleanser Soap and Water Discharge Instruction: Gently cleanse wound with antibacterial soap, rinse and pat dry prior to dressing wounds Peri-Wound Care Topical Primary Dressing Silvercel Small 2x2 (in/in) Discharge Instruction: Apply Silvercel Small 2x2 (in/in) as instructed Secondary Dressing Coverlet Latex-Free Fabric Adhesive Dressings Discharge Instruction: 1.5 x 2 Secured With Compression Wrap Compression Stockings Add-Ons Electronic Signature(s) Signed: 05/01/2023 11:42:03 AM By: [name] RN Entered By: [name] on 05/01/2023 09:33:08 Lawrence [name], Lawrence [name] ([id number]) [id number] Page 11 of 11 -------------------------------------------------------------------------------- Vitals Details Patient Name: Date of Service: A DA [name], Lawrence RLA ND W. 05/01/2023 9:00  A M Medical Record Number: [id number] Patient Account Number: [id number] Date of Birth/Sex: Treating RN: Mar 18, 1951 (72 y.o. [name]) [name] Primary Care Phuong Hillary: [name] Other Clinician: Referring Nadim Malia: Treating Henretta Quist/Extender: [name] in Treatment: 13 Vital Signs Time Taken: 09:24 Temperature (F): 97.9 Height (in): 70 Pulse (bpm): 72 Weight (lbs): 302 Respiratory Rate (breaths/min): 18 Body Mass Index (BMI): 43.3 Blood Pressure (mmHg): 132/59 Reference Range: 80 - 120 mg / dl Electronic Signature(s) Signed: 05/01/2023 11:42:03 AM By: [name] RN Entered By: [name] on 05/01/2023 01:02:72

## 2023-05-01 NOTE — Progress Notes (Signed)
Lawrence [name] ([id number]) [id number] Page 1 of 2 Visit Report for 05/01/2023 Chief Complaint Document Details Patient Name: Date of Service: [name], [name] 05/01/2023 9:00 A M Medical Record Number: [id number] Patient Account Number: [id number] Date of Birth/Sex: Treating RN: 1951/02/06 (72 y.o. M) Primary Care Provider: [name] Other Clinician: Referring Provider: Treating Provider/Extender: [name] in Treatment: 13 Information Obtained from: Patient Chief Complaint Bilateral foot ulcers Electronic Signature(s) Signed: 05/01/2023 9:23:11 AM By: [name] Entered By: [name] on 05/01/2023 09:23:11 -------------------------------------------------------------------------------- Problem List Details Patient Name: Date of Service: [name], [name] 05/01/2023 9:00 A M Medical Record Number: [id number] Patient Account Number: [id number] Date of Birth/Sex: Treating RN: 06/18/1951 (72 y.o. 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, right ankle and foot 02/13/2023 No Yes E10.621 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 01/30/2023 No Yes L97.522 Non-pressure chronic ulcer of other part of left foot with fat 01/30/2023 No Yes layer exposed Lawrence [name] ([id number]) [id number] Page 2 of 2 [phone] Non-pressure chronic ulcer of other part of right foot with fat 01/30/2023 No Yes layer exposed I25.10 Atherosclerotic heart disease of native coronary artery without 01/30/2023 No Yes angina pectoris I10 Essential (primary) hypertension 01/30/2023 No Yes I73.89 Other specified peripheral vascular diseases 01/30/2023 No Yes N18.31 Chronic kidney  disease, stage 3a 01/30/2023 No Yes Inactive Problems Resolved Problems Electronic Signature(s) Signed: 05/01/2023 9:23:04 AM By: [name] Entered By: [name] on 05/01/2023 09:23:04

## 2023-05-04 ENCOUNTER — Encounter: Payer: Medicare Other | Admitting: Physician Assistant

## 2023-05-04 NOTE — Transitions of Care (Post Inpatient/ED Visit) (Signed)
   05/04/2023  Name: Lawrence [name] MRN: [id number] DOB: [birth year]  Today's TOC FU Call Status: Today's TOC FU Call Status:: Successful TOC FU Call Competed Unsuccessful Call (1st Attempt) Date: 04/29/23 Unsuccessful Call (2nd Attempt) Date: 04/30/23 Unsuccessful Call (3rd Attempt) Date: 05/04/23 [facility] FU Call Complete Date: 05/04/23  Attempted to reach the patient regarding the most recent Inpatient/ED visit.  Follow Up Plan: No further outreach attempts will be made at this time. We have been unable to contact the patient.  Signature   Woodfin Ganja LPN [facility] Nurse Health Advisor Direct Dial [phone]

## 2023-05-05 ENCOUNTER — Encounter: Payer: Medicare Other | Admitting: Physician Assistant

## 2023-05-05 ENCOUNTER — Ambulatory Visit: Payer: Self-pay | Admitting: *Deleted

## 2023-05-05 NOTE — Progress Notes (Signed)
Lawrence [name] ([id number]) [id number] Page 1 of 2 Visit Report for 04/03/2023 HBO Details Patient Name: Date of Service: [name], [name] 04/03/2023 9:45 A M Medical Record Number: [id number] Patient Account Number: [id number] Date of Birth/Sex: Treating RN: Jun 17, 1951 (72 y.o. M) Primary Care Lawrence [name]: Lawrence [name] Other Clinician: Referring Lawrence [name]: Treating Lawrence [name]/Extender: Lawrence [name] in Treatment: 9 HBO Treatment Course Details Treatment Course Number: 1 Ordering Lawrence [name]: Lawrence [name] T Treatments Ordered: otal 40 HBO Treatment Start Date: 02/24/2023 HBO Indication: Other (specify in Notes) HBO Treatment Details Treatment Number: 21 Patient Type: Outpatient Chamber Type: Monoplace Chamber Serial #: [id number] Treatment Protocol: 2.0 ATA with 90 minutes oxygen, and no air breaks Treatment Details Compression Rate Down: 2.0 psi / minute De-Compression Rate Up: 1.5 psi / minute Air breaks and breathing Decompress Decompress Compress Tx Pressure Begins Reached periods Begins Ends (leave unused spaces blank) Chamber Pressure (ATA 1 2 ------2 1 ) Clock Time (24 hr) 10:24 10:34 - - - - - - 12:05 12:14 Treatment Length: 110 (minutes) Treatment Segments: 4 Vital Signs Capillary Blood Glucose Reference Range: 80 - 120 mg / dl HBO Diabetic Blood Glucose Intervention Range: <131 mg/dl or >782 mg/dl Type: Time Vitals Blood Respiratory Capillary Blood Glucose Pulse Action Pulse: Temperature: Taken: Pressure: Rate: Glucose (mg/dl): Meter #: Oximetry (%) Taken: Pre 10:00 130/80 68 18 98 217 1 98 none per protocol Post 12:14 124/80 89 18 97.8 218 1 98 none per protocol Treatment Response Treatment Toleration: Well Treatment Completion Status: Treatment Completed without Adverse Event Electronic Signature(s) Signed: 04/03/2023 1:24:57 PM By: Lawrence [name] Signed: 05/05/2023 1:33:22 PM By: Lawrence [name] Entered By:  Lawrence [name] on 04/03/2023 12:50:41 [name], Lawrence Lawrence [name] ([id number]) [id number] Page 2 of 2 -------------------------------------------------------------------------------- HBO Safety Checklist Details Patient Name: Date of Service: [name], [name] 04/03/2023 9:45 A M Medical Record Number: [id number] Patient Account Number: [id number] Date of Birth/Sex: Treating RN: 1951/05/07 (72 y.o. M) Primary Care Lawrence [name]: Lawrence [name] Other Clinician: Referring Lawrence [name]: Treating Lawrence [name]/Extender: Lawrence [name] in Treatment: 9 HBO Safety Checklist Items Safety Checklist Consent Form Signed Patient voided / foley secured and emptied When did you last eato 04/03/23 Last dose of injectable or oral agent 04/03/23 glucogan Ostomy pouch emptied and vented if applicable NA All implantable devices assessed, documented and approved NA Intravenous access site secured and place NA Valuables secured Linens and cotton and cotton/polyester blend (less than 51% polyester) Personal oil-based products / skin lotions / body lotions removed Wigs or hairpieces removed NA Smoking or tobacco materials removed NA Books / newspapers / magazines / loose paper removed NA Cologne, aftershave, perfume and deodorant removed Jewelry removed (may wrap wedding band) Make-up removed NA Hair care products removed Battery operated devices (external) removed NA Heating patches and chemical warmers removed NA Titanium eyewear removed NA Nail polish cured greater than 10 hours NA Casting material cured greater than 10 hours NA Hearing aids removed NA Loose dentures or partials removed NA Prosthetics have been removed NA Patient demonstrates correct use of air break device (if applicable) Patient concerns have been addressed Patient grounding bracelet on and cord attached to chamber Specifics for Inpatients (complete in addition to above) Medication sheet sent  with patient Intravenous medications needed or due during therapy sent with patient Drainage tubes (e.g. nasogastric tube or chest tube secured and vented) Endotracheal or Tracheotomy tube secured Cuff deflated of air and inflated with saline Airway suctioned Electronic Signature(s)  Signed: 05/05/2023 1:33:22 PM By: Lawrence [name] Entered By: Lawrence [name] on 04/03/2023 12:49:50

## 2023-05-05 NOTE — Progress Notes (Signed)
Lawrence [name] ([id number]) [id number] Page 1 of 2 Visit Report for 04/03/2023 Arrival Information Details Patient Name: Date of Service: [name], [name] 04/03/2023 9:45 A M Medical Record Number: [id number] Patient Account Number: [id number] Date of Birth/Sex: Treating RN: 1951-08-27 (72 y.o. M) Primary Care Victorious Kundinger: [name] Other Clinician: Referring Lyndell Gillyard: Treating Averianna Brugger/Extender: [name] in Treatment: 9 Visit Information History Since Last Visit Added or deleted any medications: No Patient Arrived: [name] Any new allergies or adverse reactions: No Arrival Time: 09:45 Had a fall or experienced change in No Accompanied By: self activities of daily living that may affect Transfer Assistance: None risk of falls: Patient Identification Verified: Yes Signs or symptoms of abuse/neglect since last visito No Secondary Verification Process Completed: Yes Hospitalized since last visit: No Patient Requires Transmission-Based Precautions: No Implantable device outside of the clinic excluding No Patient Has Alerts: Yes cellular tissue based products placed in the center since last visit: Pain Present Now: No Electronic Signature(s) Signed: 05/05/2023 1:33:22 PM By: [name] Entered By: [name] on 04/03/2023 12:49:43 -------------------------------------------------------------------------------- Encounter Discharge Information Details Patient Name: Date of Service: [name], [name] 04/03/2023 9:45 A M Medical Record Number: [id number] Patient Account Number: [id number] Date of Birth/Sex: Treating RN: July 04, 1951 (72 y.o. M) Primary Care Darsi Tien: [name] Other Clinician: Referring Oriyah Lamphear: Treating Diago Haik/Extender: [name] in Treatment: 9 Encounter Discharge Information Items Discharge Condition: Stable Ambulatory Status: Cane Discharge Destination:  Home Transportation: Private Auto Accompanied By: self Schedule Follow-up Appointment: Yes Clinical Summary of Care: Lawrence [name], Lawrence [name] ([id number]) [id number] Page 2 of 2 Electronic Signature(s) Signed: 05/05/2023 1:33:22 PM By: [name] Entered By: [name] on 04/03/2023 12:51:17 -------------------------------------------------------------------------------- Vitals Details Patient Name: Date of Service: [name], [name] 04/03/2023 9:45 A M Medical Record Number: [id number] Patient Account Number: [id number] Date of Birth/Sex: Treating RN: 12-22-1950 (72 y.o. M) Primary Care Letrell Attwood: [name] Other Clinician: Referring Etta Gassett: Treating Amedio Bowlby/Extender: [name] in Treatment: 9 Vital Signs Time Taken: 10:00 Temperature (F): 98.0 Height (in): 70 Pulse (bpm): 68 Weight (lbs): 302 Respiratory Rate (breaths/min): 18 Body Mass Index (BMI): 43.3 Blood Pressure (mmHg): 130/80 Capillary Blood Glucose (mg/dl): 244 Reference Range: 80 - 120 mg / dl Airway Pulse Oximetry (%): 98 Electronic Signature(s) Signed: 05/05/2023 1:33:22 PM By: [name] Entered By: [name] on 04/03/2023 12:49:46

## 2023-05-05 NOTE — Chronic Care Management (AMB) (Signed)
   05/05/2023  [name] [birth year] [id number]   Erroneous Encounter

## 2023-05-06 ENCOUNTER — Encounter: Payer: Medicare Other | Admitting: Internal Medicine

## 2023-05-08 ENCOUNTER — Encounter: Payer: Medicare Other | Attending: Physician Assistant | Admitting: Physician Assistant

## 2023-05-08 ENCOUNTER — Encounter: Payer: Medicare Other | Admitting: Physician Assistant

## 2023-05-08 DIAGNOSIS — L97522 Non-pressure chronic ulcer of other part of left foot with fat layer exposed: Secondary | ICD-10-CM | POA: Diagnosis not present

## 2023-05-08 DIAGNOSIS — L97512 Non-pressure chronic ulcer of other part of right foot with fat layer exposed: Secondary | ICD-10-CM | POA: Insufficient documentation

## 2023-05-08 DIAGNOSIS — E10621 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: Secondary | ICD-10-CM | POA: Insufficient documentation

## 2023-05-08 DIAGNOSIS — I2510 Atherosclerotic heart disease of native coronary artery without angina pectoris: Secondary | ICD-10-CM | POA: Diagnosis not present

## 2023-05-08 DIAGNOSIS — I7389 Other specified peripheral vascular diseases: Secondary | ICD-10-CM | POA: Insufficient documentation

## 2023-05-08 DIAGNOSIS — M86371 Chronic multifocal osteomyelitis, right ankle and foot: Secondary | ICD-10-CM | POA: Insufficient documentation

## 2023-05-08 DIAGNOSIS — I10 Essential (primary) hypertension: Secondary | ICD-10-CM | POA: Insufficient documentation

## 2023-05-08 NOTE — Progress Notes (Signed)
Lawrence [name] ([id number]) [id number] Page 1 of 11 Visit Report for 05/08/2023 Arrival Information Details Patient Name: Date of Service: [name], [name] 05/08/2023 9:15 A M Medical Record Number: [id number] Patient Account Number: [id number] Date of Birth/Sex: Treating RN: [birth year] (72 y.o. Lawrence [name] Primary Care Novaleigh Kohlman: [name] Other Clinician: Referring Cerissa Zeiger: Treating Tomasz Steeves/Extender: [name] in Treatment: 14 Visit Information History Since Last Visit Added or deleted any medications: Yes Patient Arrived: Cane Any new allergies or adverse reactions: No Arrival Time: 09:26 Had a fall or experienced change in No Accompanied By: wife activities of daily living that may affect Transfer Assistance: None risk of falls: Patient Identification Verified: Yes Hospitalized since last visit: No Secondary Verification Process Completed: Yes Has Dressing in Place as Prescribed: Yes Patient Requires Transmission-Based Precautions: No Has Compression in Place as Prescribed: Yes Patient Has Alerts: No Pain Present Now: Yes Electronic Signature(s) Signed: 05/08/2023 1:30:41 PM By: Angelina Pih Entered By: Angelina Pih on 05/08/2023 09:27:25 -------------------------------------------------------------------------------- Clinic Level of Care Assessment Details Patient Name: Date of Service: [name], [name] 05/08/2023 9:15 A M Medical Record Number: [id number] Patient Account Number: [id number] Date of Birth/Sex: Treating RN: 02-02-1951 (72 y.o. Lawrence [name] Primary Care Bisma Klett: [name] Other Clinician: Referring Livy Ross: Treating Rollyn Scialdone/Extender: [name] in Treatment: 14 Clinic Level of Care Assessment Items TOOL 1 Quantity Score []  - 0 Use when EandM and Procedure is performed on INITIAL visit ASSESSMENTS - Nursing Assessment / Reassessment []  -  0 General Physical Exam (combine w/ comprehensive assessment (listed just below) when performed on new pt. evals) []  - 0 Comprehensive Assessment (HX, ROS, Risk Assessments, Wounds Hx, etc.) ASSESSMENTS - Wound and Skin Assessment / Reassessment []  - 0 Dermatologic / Skin Assessment (not related to wound area) Lawrence [name] ([id number]) [id number] Page 2 of 11 ASSESSMENTS - Ostomy and/or Continence Assessment and Care []  - 0 Incontinence Assessment and Management []  - 0 Ostomy Care Assessment and Management (repouching, etc.) PROCESS - Coordination of Care []  - 0 Simple Patient / Family Education for ongoing care []  - 0 Complex (extensive) Patient / Family Education for ongoing care []  - 0 Staff obtains Chiropractor, Records, T Results / Process Orders est []  - 0 Staff telephones HHA, Nursing Homes / Clarify orders / etc []  - 0 Routine Transfer to another Facility (non-emergent condition) []  - 0 Routine Hospital Admission (non-emergent condition) []  - 0 New Admissions / Manufacturing engineer / Ordering NPWT Apligraf, etc. , []  - 0 Emergency Hospital Admission (emergent condition) PROCESS - Special Needs []  - 0 Pediatric / Minor Patient Management []  - 0 Isolation Patient Management []  - 0 Hearing / Language / Visual special needs []  - 0 Assessment of Community assistance (transportation, D/C planning, etc.) []  - 0 Additional assistance / Altered mentation []  - 0 Support Surface(s) Assessment (bed, cushion, seat, etc.) INTERVENTIONS - Miscellaneous []  - 0 External ear exam []  - 0 Patient Transfer (multiple staff / Nurse, adult / Similar devices) []  - 0 Simple Staple / Suture removal (25 or less) []  - 0 Complex Staple / Suture removal (26 or more) []  - 0 Hypo/Hyperglycemic Management (do not check if billed separately) []  - 0 Ankle / Brachial Index (ABI) - do not check if billed separately Has the patient been seen at the hospital  within the last three years: Yes Total Score: 0 Level Of Care: ____ Electronic Signature(s) Signed: 05/08/2023 1:30:41  PM By: Angelina Pih Entered By: Angelina Pih on 05/08/2023 10:00:54 -------------------------------------------------------------------------------- Encounter Discharge Information Details Patient Name: Date of Service: [name], [name] 05/08/2023 9:15 A M Medical Record Number: [id number] Patient Account Number: [id number] Date of Birth/Sex: Treating RN: [birth year] (72 y.o. Lawrence [name] Primary Care Derrek Puff: [name] Other Clinician: Referring Milly Goggins: Treating Keyla Milone/Extender: [name] in Treatment: [phone] Encounter Discharge Information Items Post Procedure Vitals Lawrence [name], Lawrence [name] ([id number]) [id number] Page 3 of 11 Discharge Condition: Stable Temperature (F): 97.8 Ambulatory Status: Cane Pulse (bpm): 70 Discharge Destination: Home Respiratory Rate (breaths/min): 18 Transportation: Private Auto Blood Pressure (mmHg): 110/46 Accompanied By: spouse Schedule Follow-up Appointment: Yes Clinical Summary of Care: Electronic Signature(s) Signed: 05/08/2023 11:04:59 AM By: Angelina Pih Entered By: Angelina Pih on 05/08/2023 11:04:59 -------------------------------------------------------------------------------- Lower Extremity Assessment Details Patient Name: Date of Service: [name], [name] 05/08/2023 9:15 A M Medical Record Number: [id number] Patient Account Number: [id number] Date of Birth/Sex: Treating RN: 11-10-1950 (72 y.o. Lawrence [name] Primary Care Jayd Cadieux: [name] Other Clinician: Referring Hedy Garro: Treating Fendi Meinhardt/Extender: [name] in Treatment: 14 Edema Assessment Assessed: [Left: No] [Right: No] Edema: [Left: Yes] [Right: Yes] Calf Left: Right: Point of Measurement: 33 cm From Medial Instep 44 cm 41.7 cm Ankle Left:  Right: Point of Measurement: 12 cm From Medial Instep 27.5 cm 28.5 cm Vascular Assessment Pulses: Dorsalis Pedis Palpable: [Left:Yes] [Right:Yes] Electronic Signature(s) Signed: 05/08/2023 1:30:41 PM By: Angelina Pih Entered By: Angelina Pih on 05/08/2023 09:43:48 Lawrence [name], Lawrence [name] ([id number]) [id number] Page 4 of 11 -------------------------------------------------------------------------------- Multi Wound Chart Details Patient Name: Date of Service: [name], [name] 05/08/2023 9:15 A M Medical Record Number: [id number] Patient Account Number: [id number] Date of Birth/Sex: Treating RN: 17-Jul-1951 (72 y.o. Lawrence [name] Primary Care Martinique Pizzimenti: [name] Other Clinician: Referring Cordel Drewes: Treating Jamiyah Dingley/Extender: [name] in Treatment: 14 Vital Signs Height(in): 70 Pulse(bpm): 70 Weight(lbs): 302 Blood Pressure(mmHg): 112/46 Body Mass Index(BMI): 43.3 Temperature(F): 97.8 Respiratory Rate(breaths/min): 18 [1:Photos:] Right T Second oe Right T Third oe Left T Third oe Wound Location: Gradually Appeared Gradually Appeared Gradually Appeared Wounding Event: Diabetic Wound/Ulcer of the Lower Diabetic Wound/Ulcer of the Lower Diabetic Wound/Ulcer of the Lower Primary Etiology: Extremity Extremity Extremity Glaucoma, Coronary Artery Disease, Glaucoma, Coronary Artery Disease, Glaucoma, Coronary Artery Disease, Comorbid History: Hypertension, Myocardial Infarction, Hypertension, Myocardial Infarction, Hypertension, Myocardial Infarction, Type II Diabetes, End Stage Renal Type II Diabetes, End Stage Renal Type II Diabetes, End Stage Renal Disease Disease Disease 11/03/2022 11/03/2022 12/05/2022 Date Acquired: 14 14 14  Weeks of Treatment: Open Open Open Wound Status: No No No Wound Recurrence: Yes Yes Yes Pending A mputation on Presentation: 0.8x1x0.1 0.2x0.5x0.1 0.6x1.1x0.1 Measurements L x W x D  (cm) 0.628 0.079 0.518 A (cm) : rea 0.063 0.008 0.052 Volume (cm) : 20.00% 83.20% 72.50% % Reduction in A rea: 73.30% 83.00% 86.20% % Reduction in Volume: Grade 3 Grade 3 Grade 3 Classification: Medium Medium Medium Exudate A mount: Serosanguineous Serosanguineous Serosanguineous Exudate Type: red, brown red, brown red, brown Exudate Color: Distinct, outline attached Distinct, outline attached N/A Wound Margin: Medium (34-66%) Large (67-100%) Small (1-33%) Granulation A mount: Pink N/A Pink Granulation Quality: Medium (34-66%) Small (1-33%) Large (67-100%) Necrotic A mount: Fat Layer (Subcutaneous Tissue): Yes Fat Layer (Subcutaneous Tissue): Yes Fat Layer (Subcutaneous Tissue): Yes Exposed Structures: Bone: Yes Fascia: No Fascia: No Tendon: No Tendon: No Muscle: No Muscle: No Joint: No Joint:  No Bone: No Bone: No None None N/A Epithelialization: Treatment Notes Electronic Signature(s) Signed: 05/08/2023 1:30:41 PM By: Angelina Pih Entered By: Angelina Pih on 05/08/2023 09:45:32 Lawrence [name], Lawrence [name] ([id number]) [id number] Page 5 of 11 -------------------------------------------------------------------------------- Multi-Disciplinary Care Plan Details Patient Name: Date of Service: [name], [name] 05/08/2023 9:15 A M Medical Record Number: [id number] Patient Account Number: [id number] Date of Birth/Sex: Treating RN: Jun 16, 1951 (72 y.o. Lawrence [name] Primary Care Albina Gosney: [name] Other Clinician: Referring Ki Corbo: Treating Sanda Dejoy/Extender: [name] in Treatment: 14 Active Inactive Wound/Skin Impairment Nursing Diagnoses: Knowledge deficit related to ulceration/compromised skin integrity Goals: Patient/caregiver will verbalize understanding of skin care regimen Date Initiated: 01/30/2023 Target Resolution Date: 05/15/2023 Goal Status: Active Ulcer/skin breakdown will have a volume  reduction of 30% by week 4 Date Initiated: 01/30/2023 Date Inactivated: 03/06/2023 Target Resolution Date: 03/02/2023 Goal Status: Unmet Unmet Reason: comorbidities Ulcer/skin breakdown will have a volume reduction of 50% by week 8 Date Initiated: 01/30/2023 Date Inactivated: 04/10/2023 Target Resolution Date: 04/01/2023 Goal Status: Unmet Unmet Reason: comorbidities Ulcer/skin breakdown will have a volume reduction of 80% by week 12 Date Initiated: 01/30/2023 Date Inactivated: 05/08/2023 Target Resolution Date: 05/02/2023 Goal Status: Unmet Unmet Reason: comorbidities Ulcer/skin breakdown will heal within 14 weeks Date Initiated: 01/30/2023 Target Resolution Date: 06/01/2023 Goal Status: Active Interventions: Assess patient/caregiver ability to obtain necessary supplies Assess patient/caregiver ability to perform ulcer/skin care regimen upon admission and as needed Assess ulceration(s) every visit Notes: Electronic Signature(s) Signed: 05/08/2023 1:30:41 PM By: Angelina Pih Entered By: Angelina Pih on 05/08/2023 10:01:57 -------------------------------------------------------------------------------- Pain Assessment Details Patient Name: Date of Service: [name], [name] 05/08/2023 9:15 A M Medical Record Number: [id number] Patient Account Number: [id number] Lawrence [name], Lawrence [name] ([id number]) [id number].pdf Page 6 of 11 Date of Birth/Sex: Treating RN: 03/18/1951 (72 y.o. Lawrence [name] Primary Care Del Wiseman: Other Clinician: [name] Referring Caliah Kopke: Treating Yunior Jain/Extender: [name] in Treatment: 14 Active Problems Location of Pain Severity and Description of Pain Patient Has Paino Yes Site Locations Rate the pain. Current Pain Level: 3 Pain Management and Medication Current Pain Management: Notes right toes Electronic Signature(s) Signed: 05/08/2023 1:30:41 PM By: Angelina Pih Entered By: Angelina Pih  on 05/08/2023 09:31:16 -------------------------------------------------------------------------------- Patient/Caregiver Education Details Patient Name: Date of Service: Lawrence Householder MS, [name] 7/5/2024andnbsp9:15 A M Medical Record Number: [id number] Patient Account Number: [id number] Date of Birth/Gender: Treating RN: 1951/03/21 (72 y.o. Lawrence [name] Primary Care Physician: [name] Other Clinician: Referring Physician: Treating Physician/Extender: [name] in Treatment: 14 Education Assessment Education Provided To: Patient Education Topics Provided Wound Debridement: Handouts: Wound Debridement Methods: Explain/Verbal Responses: State content correctly Lawrence [name] ([id number]) [id number] Page 7 of 11 Wound/Skin Impairment: Handouts: Caring for Your Ulcer Methods: Explain/Verbal Responses: State content correctly Electronic Signature(s) Signed: 05/08/2023 1:30:41 PM By: Angelina Pih Entered By: Angelina Pih on 05/08/2023 10:02:50 -------------------------------------------------------------------------------- Wound Assessment Details Patient Name: Date of Service: [name], [name] 05/08/2023 9:15 A M Medical Record Number: [id number] Patient Account Number: [id number] Date of Birth/Sex: Treating RN: 04-Aug-1951 (72 y.o. Lawrence [name] Primary Care Jaishaun Mcnab: [name] Other Clinician: Referring Loreta Blouch: Treating Baird Polinski/Extender: [name] in Treatment: 14 Wound Status Wound Number: 1 Primary Diabetic Wound/Ulcer of the Lower Extremity Etiology: Wound Location: Right T Second oe Wound Open Wounding Event: Gradually Appeared Status: Date Acquired: 11/03/2022 Comorbid Glaucoma, Coronary Artery Disease, Hypertension, Myocardial Weeks Of Treatment:  14 History: Infarction, Type II Diabetes, End Stage Renal Disease Clustered Wound: No Pending Amputation On  Presentation Photos Wound Measurements Length: (cm) 0.8 Width: (cm) 1 Depth: (cm) 0.1 Area: (cm) 0.628 Volume: (cm) 0.063 % Reduction in Area: 20% % Reduction in Volume: 73.3% Epithelialization: None Tunneling: No Undermining: No Wound Description Classification: Grade 3 Wound Margin: Distinct, outline attached Exudate Amount: Medium Exudate Type: Serosanguineous Exudate Color: red, brown Foul Odor After Cleansing: No Slough/Fibrino Yes Wound Bed Granulation Amount: Medium (34-66%) Exposed Structure Granulation Quality: Pink Fat Layer (Subcutaneous Tissue) Exposed: [address] RANDALPH, DUFRENE ([id number]) [id number] Page 8 of 11 Necrotic Amount: Medium (34-66%) Bone Exposed: Yes Necrotic Quality: Adherent Slough Treatment Notes Wound #1 (Toe Second) Wound Laterality: Right Cleanser Soap and Water Discharge Instruction: Gently cleanse wound with antibacterial soap, rinse and pat dry prior to dressing wounds Peri-Wound Care Topical Primary Dressing Hydrofera Blue Ready Transfer Foam, 2.5x2.5 (in/in) Discharge Instruction: Apply Hydrofera Blue Ready to wound bed as directed Secondary Dressing Coverlet Latex-Free Fabric Adhesive Dressings Discharge Instruction: 1.5 x 2 Secured With Compression Wrap Compression Stockings Add-Ons Electronic Signature(s) Signed: 05/08/2023 1:30:41 PM By: Angelina Pih Entered By: Angelina Pih on 05/08/2023 09:41:57 -------------------------------------------------------------------------------- Wound Assessment Details Patient Name: Date of Service: [name], [name] 05/08/2023 9:15 A M Medical Record Number: [id number] Patient Account Number: [id number] Date of Birth/Sex: Treating RN: 1951-10-24 (72 y.o. Lawrence [name] Primary Care Daylani Deblois: [name] Other Clinician: Referring Lawrence [name]: Treating Malorie Bigford/Extender: [name] in Treatment: 14 Wound Status Wound Number: 2  Primary Diabetic Wound/Ulcer of the Lower Extremity Etiology: Wound Location: Right T Third oe Wound Open Wounding Event: Gradually Appeared Status: Date Acquired: 11/03/2022 Comorbid Glaucoma, Coronary Artery Disease, Hypertension, Myocardial Weeks Of Treatment: 14 History: Infarction, Type II Diabetes, End Stage Renal Disease Clustered Wound: No Pending Amputation On Presentation Photos Lawrence [name], Lawrence [name] ([id number]) [id number] Page 9 of 11 Wound Measurements Length: (cm) 0.2 Width: (cm) 0.5 Depth: (cm) 0.1 Area: (cm) 0.079 Volume: (cm) 0.008 % Reduction in Area: 83.2% % Reduction in Volume: 83% Epithelialization: None Tunneling: No Undermining: No Wound Description Classification: Grade 3 Wound Margin: Distinct, outline attached Exudate Amount: Medium Exudate Type: Serosanguineous Exudate Color: red, brown Foul Odor After Cleansing: No Slough/Fibrino Yes Wound Bed Granulation Amount: Large (67-100%) Exposed Structure Necrotic Amount: Small (1-33%) Fascia Exposed: No Necrotic Quality: Adherent Slough Fat Layer (Subcutaneous Tissue) Exposed: Yes Tendon Exposed: No Muscle Exposed: No Joint Exposed: No Bone Exposed: No Treatment Notes Wound #2 (Toe Third) Wound Laterality: Right Cleanser Soap and Water Discharge Instruction: Gently cleanse wound with antibacterial soap, rinse and pat dry prior to dressing wounds Peri-Wound Care Topical Primary Dressing Hydrofera Blue Ready Transfer Foam, 2.5x2.5 (in/in) Discharge Instruction: Apply Hydrofera Blue Ready to wound bed as directed Secondary Dressing Coverlet Latex-Free Fabric Adhesive Dressings Discharge Instruction: 1.5 x 2 Secured With Compression Wrap Compression Stockings Add-Ons Electronic Signature(s) Signed: 05/08/2023 1:30:41 PM By: Angelina Pih Entered By: Angelina Pih on 05/08/2023 09:42:32 Lawrence [name], Lawrence [name] ([id number]) [id number] Page 10 of  11 -------------------------------------------------------------------------------- Wound Assessment Details Patient Name: Date of Service: [name], [name] 05/08/2023 9:15 A M Medical Record Number: [id number] Patient Account Number: [id number] Date of Birth/Sex: Treating RN: 05-04-1951 (72 y.o. Lawrence [name] Primary Care Jalexis Breed: [name] Other Clinician: Referring Lawrence [name]: Treating Lawrence [name]/Extender: [name] in Treatment: 14 Wound Status Wound Number: 4 Primary Diabetic Wound/Ulcer of the Lower Extremity Etiology: Wound Location: Left T Third oe Wound  Open Wounding Event: Gradually Appeared Status: Date Acquired: 12/05/2022 Comorbid Glaucoma, Coronary Artery Disease, Hypertension, Myocardial Weeks Of Treatment: 14 History: Infarction, Type II Diabetes, End Stage Renal Disease Clustered Wound: No Pending Amputation On Presentation Photos Wound Measurements Length: (cm) 0.6 Width: (cm) 1.1 Depth: (cm) 0.1 Area: (cm) 0.518 Volume: (cm) 0.052 % Reduction in Area: 72.5% % Reduction in Volume: 86.2% Tunneling: No Undermining: No Wound Description Classification: Grade 3 Exudate Amount: Medium Exudate Type: Serosanguineous Exudate Color: red, brown Foul Odor After Cleansing: No Slough/Fibrino Yes Wound Bed Granulation Amount: Small (1-33%) Exposed Structure Granulation Quality: Pink Fascia Exposed: No Necrotic Amount: Large (67-100%) Fat Layer (Subcutaneous Tissue) Exposed: Yes Necrotic Quality: Adherent Slough Tendon Exposed: No Muscle Exposed: No Joint Exposed: No Bone Exposed: No Treatment Notes Wound #4 (Toe Third) Wound Laterality: Left Cleanser Soap and Water Discharge Instruction: Gently cleanse wound with antibacterial soap, rinse and pat dry prior to dressing wounds Lawrence [name] ([id number]) [id number] Page 11 of 11 Peri-Wound Care Topical Primary Dressing Hydrofera Blue Ready  Transfer Foam, 2.5x2.5 (in/in) Discharge Instruction: Apply Hydrofera Blue Ready to wound bed as directed Secondary Dressing Coverlet Latex-Free Fabric Adhesive Dressings Discharge Instruction: 1.5 x 2 Secured With Compression Wrap Compression Stockings Add-Ons Electronic Signature(s) Signed: 05/08/2023 1:30:41 PM By: Angelina Pih Entered By: Angelina Pih on 05/08/2023 09:43:17 -------------------------------------------------------------------------------- Vitals Details Patient Name: Date of Service: [name], [name] 05/08/2023 9:15 A M Medical Record Number: [id number] Patient Account Number: [id number] Date of Birth/Sex: Treating RN: 06/28/1951 (72 y.o. Lawrence [name] Primary Care Taimane Stimmel: [name] Other Clinician: Referring Mercie Balsley: Treating Heinrich Fertig/Extender: [name] in Treatment: 14 Vital Signs Time Taken: 09:28 Temperature (F): 97.8 Height (in): 70 Pulse (bpm): 70 Weight (lbs): 302 Respiratory Rate (breaths/min): 18 Body Mass Index (BMI): 43.3 Blood Pressure (mmHg): 112/46 Reference Range: 80 - 120 mg / dl Electronic Signature(s) Signed: 05/08/2023 1:30:41 PM By: Angelina Pih Entered By: Angelina Pih on 05/08/2023 09:31:12

## 2023-05-08 NOTE — Progress Notes (Signed)
Lawrence [name] ([id number]) [id number] Page 1 of 2 Visit Report for 05/08/2023 Chief Complaint Document Details Patient Name: Date of Service: [name], [name] 05/08/2023 9:15 A M Medical Record Number: [id number] Patient Account Number: [id number] Date of Birth/Sex: Treating RN: 10-22-1951 (72 y.o. [name] Primary Care Provider: [name] Other Clinician: Referring Provider: Treating Provider/Extender: [name] in Treatment: 14 Information Obtained from: Patient Chief Complaint Bilateral foot ulcers Electronic Signature(s) Signed: 05/08/2023 9:32:40 AM By: [name] Entered By: [name] on 05/08/2023 09:32:39 -------------------------------------------------------------------------------- Problem List Details Patient Name: Date of Service: [name], [name] 05/08/2023 9:15 A M Medical Record Number: [id number] Patient Account Number: [id number] Date of Birth/Sex: Treating RN: 06-30-1951 (72 y.o. 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/30/2023 No Yes layer exposed [name], [name] ([id number]) [id number] Page 2 of 2 [phone] Non-pressure chronic ulcer of other part of right foot with fat 01/30/2023 No Yes layer exposed I25.10 Atherosclerotic heart disease of native coronary artery without 01/30/2023 No Yes angina pectoris I10 Essential (primary) hypertension 01/30/2023 No Yes I73.89 Other specified peripheral vascular diseases 01/30/2023 No  Yes N18.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, stage 3a 01/30/2023 No Yes Inactive Problems Resolved Problems Electronic Signature(s) Signed: 05/08/2023 9:32:36 AM By: [name] Entered By: [name] on 05/08/2023 09:32:36

## 2023-05-11 ENCOUNTER — Encounter: Payer: Medicare Other | Admitting: Physician Assistant

## 2023-05-12 ENCOUNTER — Encounter: Payer: Medicare Other | Admitting: Physician Assistant

## 2023-05-13 ENCOUNTER — Encounter: Payer: Medicare Other | Admitting: Internal Medicine

## 2023-05-14 ENCOUNTER — Encounter: Payer: Medicare Other | Admitting: Physician Assistant

## 2023-05-15 ENCOUNTER — Encounter: Payer: Medicare Other | Admitting: Physician Assistant

## 2023-05-15 DIAGNOSIS — M86371 Chronic multifocal osteomyelitis, right ankle and foot: Secondary | ICD-10-CM | POA: Diagnosis not present

## 2023-05-15 NOTE — Progress Notes (Signed)
CEASER, GARAY ([id number]) [id number] Page 1 of 2 Visit Report for 05/15/2023 Chief Complaint Document Details Patient Name: Date of Service: [name], [name] 05/15/2023 9:15 A M Medical Record Number: [id number] Patient Account Number: [id number] Date of Birth/Sex: Treating RN: 12/07/1950 (72 y.o. Lawrence [name] Primary Care Provider: [name] Other Clinician: Referring Provider: Treating Provider/Extender: [name] in Treatment: 15 Information Obtained from: Patient Chief Complaint Bilateral foot ulcers Electronic Signature(s) Signed: 05/15/2023 9:34:54 AM By: [name] Entered By: [name] on 05/15/2023 09:34:54 -------------------------------------------------------------------------------- Problem List Details Patient Name: Date of Service: [name], [name] 05/15/2023 9:15 A M Medical Record Number: [id number] Patient Account Number: [id number] Date of Birth/Sex: Treating RN: 1951-05-08 (72 y.o. 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 DENSON, JULIAN ([id number]) [id number] Page 2 of 2 [phone] Non-pressure chronic ulcer of other part of right foot with fat 01/30/2023 No Yes layer exposed 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

## 2023-05-15 NOTE — Progress Notes (Signed)
Lawrence, [name] ([id number]) [id number] Page 1 of 11 Visit Report for 05/15/2023 Arrival Information Details Patient Name: Date of Service: [name], [name] 05/15/2023 9:15 A M Medical Record Number: [id number] Patient Account Number: [id number] Date of Birth/Sex: Treating RN: 1951-01-15 (72 y.o. Lawrence [name] Primary Care Lawrence [name]: Lawrence [name] Other Clinician: Referring Lawrence [name]: Treating Lawrence [name]/Extender: Lawrence [name] in Treatment: 15 Visit Information History Since Last Visit Added or deleted any medications: No Patient Arrived: Lawrence [name] Any new allergies or adverse reactions: No Arrival Time: 09:18 Had a fall or experienced change in No Accompanied By: spouse activities of daily living that may affect Transfer Assistance: None risk of falls: Patient Identification Verified: Yes Hospitalized since last visit: No Secondary Verification Process Completed: Yes Has Dressing in Place as Prescribed: Yes Patient Requires Transmission-Based Precautions: No Has Compression in Place as Prescribed: Yes Patient Has Alerts: No Pain Present Now: Yes Electronic Signature(s) Signed: 05/15/2023 1:24:08 PM By: Lawrence [name] Entered By: Lawrence [name] on 05/15/2023 09:19:12 -------------------------------------------------------------------------------- Clinic Level of Care Assessment Details Patient Name: Date of Service: [name], [name] 05/15/2023 9:15 A M Medical Record Number: [id number] Patient Account Number: [id number] Date of Birth/Sex: Treating RN: 1951/10/10 (72 y.o. Lawrence [name] Primary Care Lawrence [name]: Lawrence [name] Other Clinician: Referring Lawrence [name]: Treating Lawrence [name]/Extender: Lawrence [name] in Treatment: 15 Clinic Level of Care Assessment Items TOOL 1 Quantity Score []  - 0 Use when EandM and Procedure is performed on INITIAL visit ASSESSMENTS - Nursing Assessment /  Reassessment []  - 0 General Physical Exam (combine w/ comprehensive assessment (listed just below) when performed on new pt. evals) []  - 0 Comprehensive Assessment (HX, ROS, Risk Assessments, Wounds Hx, etc.) ASSESSMENTS - Wound and Skin Assessment / Reassessment []  - 0 Dermatologic / Skin Assessment (not related to wound area) Lawrence [name] ([id number]) [id number] Page 2 of 11 ASSESSMENTS - Ostomy and/or Continence Assessment and Care []  - 0 Incontinence Assessment and Management []  - 0 Ostomy Care Assessment and Management (repouching, etc.) PROCESS - Coordination of Care []  - 0 Simple Patient / Family Education for ongoing care []  - 0 Complex (extensive) Patient / Family Education for ongoing care []  - 0 Staff obtains Chiropractor, Records, T Results / Process Orders est []  - 0 Staff telephones HHA, Nursing Homes / Clarify orders / etc []  - 0 Routine Transfer to another Facility (non-emergent condition) []  - 0 Routine Hospital Admission (non-emergent condition) []  - 0 New Admissions / Manufacturing engineer / Ordering NPWT Apligraf, etc. , []  - 0 Emergency Hospital Admission (emergent condition) PROCESS - Special Needs []  - 0 Pediatric / Minor Patient Management []  - 0 Isolation Patient Management []  - 0 Hearing / Language / Visual special needs []  - 0 Assessment of Community assistance (transportation, D/C planning, etc.) []  - 0 Additional assistance / Altered mentation []  - 0 Support Surface(s) Assessment (bed, cushion, seat, etc.) INTERVENTIONS - Miscellaneous []  - 0 External ear exam []  - 0 Patient Transfer (multiple staff / Nurse, adult / Similar devices) []  - 0 Simple Staple / Suture removal (25 or less) []  - 0 Complex Staple / Suture removal (26 or more) []  - 0 Hypo/Hyperglycemic Management (do not check if billed separately) []  - 0 Ankle / Brachial Index (ABI) - do not check if billed separately Has the patient been seen  at the hospital within the last three years: Yes Total Score: 0 Level Of Care: ____ Electronic Signature(s) Signed: 05/15/2023 1:24:08  PM By: Lawrence [name] Entered By: Lawrence [name] on 05/15/2023 10:15:54 -------------------------------------------------------------------------------- Encounter Discharge Information Details Patient Name: Date of Service: [name], [name] 05/15/2023 9:15 A M Medical Record Number: [id number] Patient Account Number: [id number] Date of Birth/Sex: Treating RN: [birth year] (72 y.o. Lawrence [name] Primary Care Lawrence [name]: Lawrence [name] Other Clinician: Referring Lawrence [name]: Treating Lawrence [name]/Extender: Lawrence [name] in Treatment: [phone] Encounter Discharge Information Items Post Procedure Vitals Lawrence, [name] ([id number]) [id number] Page 3 of 11 Discharge Condition: Stable Temperature (F): 97.5 Ambulatory Status: Cane Pulse (bpm): 69 Discharge Destination: Home Respiratory Rate (breaths/min): 18 Transportation: Private Auto Blood Pressure (mmHg): 109/65 Accompanied By: spouse Schedule Follow-up Appointment: Yes Clinical Summary of Care: Electronic Signature(s) Signed: 05/15/2023 1:24:08 PM By: Lawrence [name] Entered By: Lawrence [name] on 05/15/2023 10:16:48 -------------------------------------------------------------------------------- Lower Extremity Assessment Details Patient Name: Date of Service: [name], [name] 05/15/2023 9:15 A M Medical Record Number: [id number] Patient Account Number: [id number] Date of Birth/Sex: Treating RN: 01/05/1951 (72 y.o. Lawrence [name] Primary Care Izaan Kingbird: Lawrence [name] Other Clinician: Referring Burr Soffer: Treating Lititia Sen/Extender: Lawrence [name] in Treatment: 15 Edema Assessment Assessed: [Left: No] [Right: No] Edema: [Left: Yes] [Right: Yes] Calf Left: Right: Point of Measurement: 33 cm From Medial Instep 44 cm 42.1  cm Ankle Left: Right: Point of Measurement: 12 cm From Medial Instep 29.4 cm 29.8 cm Vascular Assessment Pulses: Dorsalis Pedis Palpable: [Left:Yes] [Right:Yes] Electronic Signature(s) Signed: 05/15/2023 1:24:08 PM By: Lawrence [name] Entered By: Lawrence [name] on 05/15/2023 09:31:17 [name], Lawrence [name] ([id number]) [id number] Page 4 of 11 -------------------------------------------------------------------------------- Multi Wound Chart Details Patient Name: Date of Service: [name], [name] 05/15/2023 9:15 A M Medical Record Number: [id number] Patient Account Number: [id number] Date of Birth/Sex: Treating RN: 19-Oct-1951 (72 y.o. Lawrence [name] Primary Care Miciah Covelli: Lawrence [name] Other Clinician: Referring Mirely Pangle: Treating Raelynn Corron/Extender: Lawrence [name] in Treatment: 15 Vital Signs Height(in): 70 Pulse(bpm): 69 Weight(lbs): 302 Blood Pressure(mmHg): 109/65 Body Mass Index(BMI): 43.3 Temperature(F): 97.5 Respiratory Rate(breaths/min): 18 [1:Photos:] Right T Second oe Right T Third oe Left T Third oe Wound Location: Gradually Appeared Gradually Appeared Gradually Appeared Wounding Event: Diabetic Wound/Ulcer of the Lower Diabetic Wound/Ulcer of the Lower Diabetic Wound/Ulcer of the Lower Primary Etiology: Extremity Extremity Extremity Glaucoma, Coronary Artery Disease, Glaucoma, Coronary Artery Disease, Glaucoma, Coronary Artery Disease, Comorbid History: Hypertension, Myocardial Infarction, Hypertension, Myocardial Infarction, Hypertension, Myocardial Infarction, Type II Diabetes, End Stage Renal Type II Diabetes, End Stage Renal Type II Diabetes, End Stage Renal Disease Disease Disease 11/03/2022 11/03/2022 12/05/2022 Date Acquired: 15 15 15  Weeks of Treatment: Open Open Open Wound Status: No No No Wound Recurrence: Yes Yes Yes Pending A mputation on Presentation: 0.7x0.9x0.1 0.3x0.3x0.1  0.5x0.8x0.1 Measurements L x W x D (cm) 0.495 0.071 0.314 A (cm) : rea 0.049 0.007 0.031 Volume (cm) : 36.90% 84.90% 83.30% % Reduction in A rea: 79.20% 85.10% 91.80% % Reduction in Volume: Grade 3 Grade 3 Grade 3 Classification: Medium Medium Medium Exudate A mount: Serosanguineous Serosanguineous Serosanguineous Exudate Type: red, brown red, brown red, brown Exudate Color: Distinct, outline attached Distinct, outline attached N/A Wound Margin: Medium (34-66%) Medium (34-66%) Small (1-33%) Granulation A mount: Pink N/A Pink Granulation Quality: Medium (34-66%) Medium (34-66%) Large (67-100%) Necrotic A mount: Fat Layer (Subcutaneous Tissue): Yes Fat Layer (Subcutaneous Tissue): Yes Fat Layer (Subcutaneous Tissue): Yes Exposed Structures: Bone: Yes Fascia: No Fascia: No Tendon: No Tendon: No Muscle: No Muscle: No Joint: No Joint:  No Bone: No Bone: No None None N/A Epithelialization: Treatment Notes Electronic Signature(s) Signed: 05/15/2023 1:24:08 PM By: Lawrence [name] Entered By: Lawrence [name] on 05/15/2023 09:32:07 Humble, Lawrence [name] ([id number]) [id number] Page 5 of 11 -------------------------------------------------------------------------------- Multi-Disciplinary Care Plan Details Patient Name: Date of Service: [name], [name] 05/15/2023 9:15 A M Medical Record Number: [id number] Patient Account Number: [id number] Date of Birth/Sex: Treating RN: 1951/08/01 (72 y.o. Lawrence [name] Primary Care Vira Chaplin: Lawrence [name] Other Clinician: Referring Katai Marsico: Treating Aayliah Rotenberry/Extender: Lawrence [name] in Treatment: 15 Active Inactive Wound/Skin Impairment Nursing Diagnoses: Knowledge deficit related to ulceration/compromised skin integrity Goals: Patient/caregiver will verbalize understanding of skin care regimen Date Initiated: 01/30/2023 Date Inactivated: 05/15/2023 Target Resolution Date:  05/15/2023 Goal Status: Met Ulcer/skin breakdown will have a volume reduction of 30% by week 4 Date Initiated: 01/30/2023 Date Inactivated: 03/06/2023 Target Resolution Date: 03/02/2023 Goal Status: Unmet Unmet Reason: comorbidities Ulcer/skin breakdown will have a volume reduction of 50% by week 8 Date Initiated: 01/30/2023 Date Inactivated: 04/10/2023 Target Resolution Date: 04/01/2023 Goal Status: Unmet Unmet Reason: comorbidities Ulcer/skin breakdown will have a volume reduction of 80% by week 12 Date Initiated: 01/30/2023 Date Inactivated: 05/08/2023 Target Resolution Date: 05/02/2023 Goal Status: Unmet Unmet Reason: comorbidities Ulcer/skin breakdown will heal within 14 weeks Date Initiated: 01/30/2023 Target Resolution Date: 06/01/2023 Goal Status: Active Interventions: Assess patient/caregiver ability to obtain necessary supplies Assess patient/caregiver ability to perform ulcer/skin care regimen upon admission and as needed Assess ulceration(s) every visit Notes: Electronic Signature(s) Signed: 05/15/2023 9:56:19 AM By: Lawrence [name] Entered By: Lawrence [name] on 05/15/2023 09:56:19 -------------------------------------------------------------------------------- Pain Assessment Details Patient Name: Date of Service: [name], [name] 05/15/2023 9:15 A M Medical Record Number: [id number] Patient Account Number: [id number] NICHOLOS, HUAMAN ([id number]) [id number] Page 6 of 11 Date of Birth/Sex: Treating RN: 09-Sep-1951 (72 y.o. Lawrence [name] Primary Care Darleny Sem: Other Clinician: Vernona [name] Referring Evynn Boutelle: Treating Jesiah Grismer/Extender: Lawrence [name] in Treatment: 15 Active Problems Location of Pain Severity and Description of Pain Patient Has Paino Yes Site Locations Rate the pain. Current Pain Level: 5 Pain Management and Medication Current Pain Management: Electronic Signature(s) Signed: 05/15/2023 1:24:08  PM By: Lawrence [name] Entered By: Lawrence [name] on 05/15/2023 09:20:47 -------------------------------------------------------------------------------- Patient/Caregiver Education Details Patient Name: Date of Service: [name] MS, [name] 7/12/2024andnbsp9:15 A M Medical Record Number: [id number] Patient Account Number: [id number] Date of Birth/Gender: Treating RN: Oct 11, 1951 (72 y.o. Lawrence [name] Primary Care Physician: Lawrence [name] Other Clinician: Referring Physician: Treating Physician/Extender: Lawrence [name] in Treatment: 15 Education Assessment Education Provided To: Patient Education Topics Provided Wound/Skin Impairment: Handouts: Caring for Your Ulcer Methods: Explain/Verbal Responses: State content correctly Electronic Signature(s) Lawrence [name] ([id number]) [id number] Page 7 of 11 Signed: 05/15/2023 1:24:08 PM By: Lawrence [name] Entered By: Lawrence [name] on 05/15/2023 09:56:46 -------------------------------------------------------------------------------- Wound Assessment Details Patient Name: Date of Service: [name], [name] 05/15/2023 9:15 A M Medical Record Number: [id number] Patient Account Number: [id number] Date of Birth/Sex: Treating RN: 03/08/1951 (72 y.o. Lawrence [name] Primary Care Bexley Mclester: Lawrence [name] Other Clinician: Referring Vinton Layson: Treating Deandre Brannan/Extender: Lawrence [name] in Treatment: 15 Wound Status Wound Number: 1 Primary Diabetic Wound/Ulcer of the Lower Extremity Etiology: Wound Location: Right T Second oe Wound Open Wounding Event: Gradually Appeared Status: Date Acquired: 11/03/2022 Comorbid Glaucoma, Coronary Artery Disease, Hypertension, Myocardial Weeks Of Treatment: 15 History: Infarction, Type II Diabetes, End Stage Renal Disease Clustered  Wound: No Pending Amputation On Presentation Photos Wound Measurements Length: (cm)  0.7 Width: (cm) 0.9 Depth: (cm) 0.1 Area: (cm) 0.495 Volume: (cm) 0.049 % Reduction in Area: 36.9% % Reduction in Volume: 79.2% Epithelialization: None Tunneling: No Undermining: No Wound Description Classification: Grade 3 Wound Margin: Distinct, outline attached Exudate Amount: Medium Exudate Type: Serosanguineous Exudate Color: red, brown Foul Odor After Cleansing: No Slough/Fibrino Yes Wound Bed Granulation Amount: Medium (34-66%) Exposed Structure Granulation Quality: Pink Fat Layer (Subcutaneous Tissue) Exposed: Yes Necrotic Amount: Medium (34-66%) Bone Exposed: Yes Necrotic Quality: Adherent Slough Treatment Notes Wound #1 (Toe Second) Wound Laterality: Right Cleanser Lawrence [name], Lawrence [name] ([id number]) [id number] Page 8 of 11 Soap and Water Discharge Instruction: Gently cleanse wound with antibacterial soap, rinse and pat dry prior to dressing wounds Peri-Wound Care Topical Primary Dressing Hydrofera Blue Ready Transfer Foam, 2.5x2.5 (in/in) Discharge Instruction: Apply Hydrofera Blue Ready to wound bed as directed Secondary Dressing Coverlet Latex-Free Fabric Adhesive Dressings Discharge Instruction: 1.5 x 2 Secured With Compression Wrap Compression Stockings Add-Ons Electronic Signature(s) Signed: 05/15/2023 1:24:08 PM By: Lawrence [name] Entered By: Lawrence [name] on 05/15/2023 09:29:43 -------------------------------------------------------------------------------- Wound Assessment Details Patient Name: Date of Service: [name], [name] 05/15/2023 9:15 A M Medical Record Number: [id number] Patient Account Number: [id number] Date of Birth/Sex: Treating RN: 1950/11/29 (73 y.o. Lawrence [name] Primary Care Kailyn Dubie: Lawrence [name] Other Clinician: Referring Olvin Rohr: Treating Zia Najera/Extender: Lawrence [name] in Treatment: 15 Wound Status Wound Number: 2 Primary Diabetic Wound/Ulcer of the Lower  Extremity Etiology: Wound Location: Right T Third oe Wound Open Wounding Event: Gradually Appeared Status: Date Acquired: 11/03/2022 Comorbid Glaucoma, Coronary Artery Disease, Hypertension, Myocardial Weeks Of Treatment: 15 History: Infarction, Type II Diabetes, End Stage Renal Disease Clustered Wound: No Pending Amputation On Presentation Photos Wound Measurements Length: (cm) 0.3 Width: (cm) 0.3 Gavin, Lawrence [name] ([id number]) Depth: (cm) 0.1 Area: (cm) 0.071 Volume: (cm) 0.007 % Reduction in Area: 84.9% % Reduction in Volume: 85.1% [id number] Page 9 of 11 Epithelialization: None Tunneling: No Undermining: No Wound Description Classification: Grade 3 Wound Margin: Distinct, outline attached Exudate Amount: Medium Exudate Type: Serosanguineous Exudate Color: red, brown Foul Odor After Cleansing: No Slough/Fibrino Yes Wound Bed Granulation Amount: Medium (34-66%) Exposed Structure Necrotic Amount: Medium (34-66%) Fascia Exposed: No Necrotic Quality: Adherent Slough Fat Layer (Subcutaneous Tissue) Exposed: Yes Tendon Exposed: No Muscle Exposed: No Joint Exposed: No Bone Exposed: No Treatment Notes Wound #2 (Toe Third) Wound Laterality: Right Cleanser Soap and Water Discharge Instruction: Gently cleanse wound with antibacterial soap, rinse and pat dry prior to dressing wounds Peri-Wound Care Topical Primary Dressing Hydrofera Blue Ready Transfer Foam, 2.5x2.5 (in/in) Discharge Instruction: Apply Hydrofera Blue Ready to wound bed as directed Secondary Dressing Coverlet Latex-Free Fabric Adhesive Dressings Discharge Instruction: 1.5 x 2 Secured With Compression Wrap Compression Stockings Add-Ons Electronic Signature(s) Signed: 05/15/2023 1:24:08 PM By: Lawrence [name] Entered By: Lawrence [name] on 05/15/2023 09:30:14 -------------------------------------------------------------------------------- Wound Assessment Details Patient  Name: Date of Service: [name], [name] 05/15/2023 9:15 A M Medical Record Number: [id number] Patient Account Number: [id number] Date of Birth/Sex: Treating RN: 1951/07/22 (72 y.o. Lawrence [name] Primary Care Yaslene Lindamood: Lawrence [name] Other Clinician: Referring Lynne Righi: Treating Nerida Boivin/Extender: Lawrence [name] in Treatment: [address] Wound Status [name], Lawrence [name] ([id number]) [id number] Page 10 of 11 Wound Number: 4 Primary Diabetic Wound/Ulcer of the Lower Extremity Etiology: Wound Location: Left T Third oe Wound Open Wounding Event: Gradually Appeared Status: Date Acquired: 12/05/2022 Comorbid Glaucoma,  Coronary Artery Disease, Hypertension, Myocardial Weeks Of Treatment: 15 History: Infarction, Type II Diabetes, End Stage Renal Disease Clustered Wound: No Pending Amputation On Presentation Photos Wound Measurements Length: (cm) 0.5 Width: (cm) 0.8 Depth: (cm) 0.1 Area: (cm) 0.314 Volume: (cm) 0.031 % Reduction in Area: 83.3% % Reduction in Volume: 91.8% Tunneling: No Undermining: No Wound Description Classification: Grade 3 Exudate Amount: Medium Exudate Type: Serosanguineous Exudate Color: red, brown Foul Odor After Cleansing: No Slough/Fibrino Yes Wound Bed Granulation Amount: Small (1-33%) Exposed Structure Granulation Quality: Pink Fascia Exposed: No Necrotic Amount: Large (67-100%) Fat Layer (Subcutaneous Tissue) Exposed: Yes Necrotic Quality: Adherent Slough Tendon Exposed: No Muscle Exposed: No Joint Exposed: No Bone Exposed: No Treatment Notes Wound #4 (Toe Third) Wound Laterality: Left Cleanser Soap and Water Discharge Instruction: Gently cleanse wound with antibacterial soap, rinse and pat dry prior to dressing wounds Peri-Wound Care Topical Primary Dressing Hydrofera Blue Ready Transfer Foam, 2.5x2.5 (in/in) Discharge Instruction: Apply Hydrofera Blue Ready to wound bed as directed Secondary  Dressing Coverlet Latex-Free Fabric Adhesive Dressings Discharge Instruction: 1.5 x 2 Secured With Compression Wrap Compression Stockings Add-Ons Electronic Signature(s) Signed: 05/15/2023 1:24:08 PM By: Lawrence [name] Entered By: Lawrence [name] on 05/15/2023 09:30:50 Lawrence [name], Lawrence [name] ([id number]) [id number] Page 11 of 11 -------------------------------------------------------------------------------- Vitals Details Patient Name: Date of Service: [name], [name] 05/15/2023 9:15 A M Medical Record Number: [id number] Patient Account Number: [id number] Date of Birth/Sex: Treating RN: [birth year] (72 y.o. Lawrence [name] Primary Care Emilya Justen: Lawrence [name] Other Clinician: Referring Fleur Audino: Treating Eriona Kinchen/Extender: Lawrence [name] in Treatment: 15 Vital Signs Time Taken: 09:19 Temperature (F): 97.5 Height (in): 70 Pulse (bpm): 69 Weight (lbs): 302 Respiratory Rate (breaths/min): 18 Body Mass Index (BMI): 43.3 Blood Pressure (mmHg): 109/65 Reference Range: 80 - 120 mg / dl Electronic Signature(s) Signed: 05/15/2023 1:24:08 PM By: Lawrence [name] Entered By: Lawrence [name] on 05/15/2023 09:20:40

## 2023-05-18 ENCOUNTER — Encounter: Payer: Medicare Other | Admitting: Physician Assistant

## 2023-05-19 ENCOUNTER — Encounter: Payer: Medicare Other | Admitting: Physician Assistant

## 2023-05-20 ENCOUNTER — Encounter: Payer: Medicare Other | Admitting: Internal Medicine

## 2023-05-21 ENCOUNTER — Encounter: Payer: Medicare Other | Admitting: Physician Assistant

## 2023-05-22 ENCOUNTER — Encounter: Payer: Medicare Other | Attending: Physician Assistant | Admitting: Physician Assistant

## 2023-05-22 ENCOUNTER — Ambulatory Visit: Payer: Medicare Other | Admitting: Physician Assistant

## 2023-05-22 DIAGNOSIS — E10621 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: Secondary | ICD-10-CM | POA: Diagnosis present

## 2023-05-22 DIAGNOSIS — L97512 Non-pressure chronic ulcer of other part of right foot with fat layer exposed: Secondary | ICD-10-CM | POA: Diagnosis not present

## 2023-05-22 DIAGNOSIS — N18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, stage 3a: Secondary | ICD-10-CM | POA: Diagnosis not present

## 2023-05-22 DIAGNOSIS — L97522 Non-pressure chronic ulcer of other part of left foot with fat layer exposed: Secondary | ICD-10-CM | POA: Insufficient documentation

## 2023-05-22 DIAGNOSIS — I120 Hypertensive chronic kidney disease with stage 5 chronic kidney disease or end stage renal disease: Secondary | ICD-10-CM | POA: Insufficient documentation

## 2023-05-22 DIAGNOSIS — I2510 Atherosclerotic heart disease of native coronary artery without angina pectoris: Secondary | ICD-10-CM | POA: Diagnosis not present

## 2023-05-22 DIAGNOSIS — E1051 Type 1 diabetes mellitus with diabetic peripheral angiopathy without gangrene: Secondary | ICD-10-CM | POA: Insufficient documentation

## 2023-05-22 DIAGNOSIS — E1022 Type 1 diabetes mellitus with diabetic chronic kidney disease: Secondary | ICD-10-CM | POA: Insufficient documentation

## 2023-05-25 ENCOUNTER — Encounter: Payer: Medicare Other | Admitting: Physician Assistant

## 2023-05-26 ENCOUNTER — Encounter: Payer: Medicare Other | Admitting: Physician Assistant

## 2023-05-26 NOTE — Progress Notes (Addendum)
Lawrence [name], Lawrence [name] ([id number]) [id number] Page 1 of 11 Visit Report for 05/22/2023 Arrival Information Details Patient Name: Date of Service: [name], [name] 05/22/2023 9:15 A M Medical Record Number: [id number] Patient Account Number: [id number] Date of Birth/Sex: Treating RN: 1951/10/13 (72 y.o. Lawrence [name] Primary Care Carmeron Heady: [name] Other Clinician: Referring Julieth Tugman: Treating Tiffanyann Deroo/Extender: [name] in Treatment: 16 Visit Information History Since Last Visit Added or deleted any medications: No Patient Arrived: Cane Any new allergies or adverse reactions: No Arrival Time: 08:24 Hospitalized since last visit: No Accompanied By: spouse Has Dressing in Place as Prescribed: Yes Transfer Assistance: None Has Compression in Place as Prescribed: Yes Patient Identification Verified: Yes Pain Present Now: No Secondary Verification Process Completed: Yes Patient Requires Transmission-Based Precautions: No Patient Has Alerts: No Electronic Signature(s) Signed: 05/26/2023 3:20:29 PM By: Bonnell Public Entered By: Bonnell Public on 05/26/2023 08:26:47 -------------------------------------------------------------------------------- Clinic Level of Care Assessment Details Patient Name: Date of Service: [name], [name] 05/22/2023 9:15 A M Medical Record Number: [id number] Patient Account Number: [id number] Date of Birth/Sex: Treating RN: [birth year] (72 y.o. Lawrence [name] Primary Care Philopateer Strine: [name] Other Clinician: Referring Maryann Mccall: Treating Navina Wohlers/Extender: [name] in Treatment: 16 Clinic Level of Care Assessment Items TOOL 1 Quantity Score []  - 0 Use when EandM and Procedure is performed on INITIAL visit ASSESSMENTS - Nursing Assessment / Reassessment []  - 0 General Physical Exam (combine w/ comprehensive assessment (listed just below) when performed on new  pt. evals) []  - 0 Comprehensive Assessment (HX, ROS, Risk Assessments, Wounds Hx, etc.) ASSESSMENTS - Wound and Skin Assessment / Reassessment []  - 0 Dermatologic / Skin Assessment (not related to wound area) ASSESSMENTS - Ostomy and/or Continence Assessment and Care Lawrence [name], Lawrence [name] ([id number]) [id number] Page 2 of 11 []  - 0 Incontinence Assessment and Management []  - 0 Ostomy Care Assessment and Management (repouching, etc.) PROCESS - Coordination of Care []  - 0 Simple Patient / Family Education for ongoing care []  - 0 Complex (extensive) Patient / Family Education for ongoing care []  - 0 Staff obtains Chiropractor, Records, T Results / Process Orders est []  - 0 Staff telephones HHA, Nursing Homes / Clarify orders / etc []  - 0 Routine Transfer to another Facility (non-emergent condition) []  - 0 Routine Hospital Admission (non-emergent condition) []  - 0 New Admissions / Manufacturing engineer / Ordering NPWT Apligraf, etc. , []  - 0 Emergency Hospital Admission (emergent condition) PROCESS - Special Needs []  - 0 Pediatric / Minor Patient Management []  - 0 Isolation Patient Management []  - 0 Hearing / Language / Visual special needs []  - 0 Assessment of Community assistance (transportation, D/C planning, etc.) []  - 0 Additional assistance / Altered mentation []  - 0 Support Surface(s) Assessment (bed, cushion, seat, etc.) INTERVENTIONS - Miscellaneous []  - 0 External ear exam []  - 0 Patient Transfer (multiple staff / Nurse, adult / Similar devices) []  - 0 Simple Staple / Suture removal (25 or less) []  - 0 Complex Staple / Suture removal (26 or more) []  - 0 Hypo/Hyperglycemic Management (do not check if billed separately) []  - 0 Ankle / Brachial Index (ABI) - do not check if billed separately Has the patient been seen at the hospital within the last three years: Yes Total Score: 0 Level Of Care: ____ Electronic Signature(s) Signed:  05/26/2023 3:20:29 PM By: Bonnell Public Entered By: Bonnell Public on 05/26/2023 08:33:34 -------------------------------------------------------------------------------- Complex / Palliative Patient Assessment Details  Patient Name: Date of Service: [name], [name] 05/22/2023 9:15 A M Medical Record Number: [id number] Patient Account Number: [id number] Date of Birth/Sex: Treating RN: Mar 01, 1951 (72 y.o. Lawrence [name] Primary Care Janaye Corp: [name] Other Clinician: Referring Beckem Tomberlin: Treating Jahyra Sukup/Extender: [name] in Treatment: 16 Complex Wound Management Criteria Patient has remarkable or complex co-morbidities requiring medications or treatments that extend wound healing times. 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 05/22/2023 9:15 A M Medical Record Number: [id number] Patient Account Number: [id number] Date of Birth/Sex: Treating RN: Mar 04, 1951 (72 y.o. Lawrence [name] Primary Care Jatavia Keltner: [name] Other Clinician: Referring Ranferi Clingan: Treating Braiden Rodman/Extender: [name] in Treatment: [phone] Encounter Discharge  Information Items Post Procedure Vitals Discharge Condition: Stable Temperature (F): 97.4 Ambulatory Status: Cane Pulse (bpm): 76 Discharge Destination: Home Respiratory Rate (breaths/min): 18 Transportation: Private Auto Blood Pressure (mmHg): 126/67 Accompanied By: spouse Schedule Follow-up Appointment: No Clinical Summary of Care: Electronic Signature(s) Signed: 05/26/2023 3:20:29 PM By: Bonnell Public Entered By: Bonnell Public on 05/26/2023 08:35:12 -------------------------------------------------------------------------------- Lower Extremity Assessment Details Patient Name: Date of Service: [name], [name] 05/22/2023 9:15 A M Medical Record Number: [id number] Patient Account Number: [id number] Date of Birth/Sex: Treating RN: 1951/02/28 (72 y.o. Lawrence [name] Primary Care Aliyah Abeyta: [name] Other Clinician: Referring Madicyn Mesina: Treating Bernice Mcauliffe/Extender: [name] in Treatment: [address] Lawrence [name] ([id number]) [id number] Page 4 of 11 Vascular Assessment Pulses: Dorsalis Pedis Palpable: [Left:Yes] [Right:Yes] Electronic Signature(s) Signed: 05/26/2023 3:20:29 PM By: Bonnell Public Entered By: Bonnell Public on 05/26/2023 08:29:59 -------------------------------------------------------------------------------- Multi Wound Chart Details Patient Name: Date of Service: [name], [name] 05/22/2023 9:15 A M Medical Record Number: [id number] Patient Account Number: [id number] Date of Birth/Sex: Treating RN: [birth year] (72 y.o. Lawrence [name] Primary Care Bayleigh Loflin: [name] Other Clinician: Referring Merritt Kibby: Treating Quintasia Theroux/Extender: [name] in Treatment: 16 Vital Signs Height(in): 70 Pulse(bpm): 76 Weight(lbs): 302 Blood Pressure(mmHg): 126/67 Body Mass Index(BMI): 43.3 Temperature(F): 97.4 Respiratory Rate(breaths/min): 18 [1:Photos:] Right T Second oe Right T  Third oe Left T Third oe Wound Location: Gradually Appeared Gradually Appeared Gradually Appeared Wounding Event: Diabetic Wound/Ulcer of the Lower Diabetic Wound/Ulcer of the Lower Diabetic Wound/Ulcer of the Lower Primary Etiology: Extremity Extremity Extremity Glaucoma, Coronary Artery Disease, Glaucoma, Coronary Artery Disease, Glaucoma, Coronary Artery Disease, Comorbid History: Hypertension, Myocardial Infarction, Hypertension, Myocardial Infarction, Hypertension, Myocardial Infarction, Type II Diabetes, End Stage Renal Type II Diabetes, End Stage Renal Type II Diabetes, End Stage Renal Disease Disease Disease 11/03/2022 11/03/2022 12/05/2022 Date Acquired: 16 16 16  Weeks of Treatment: Open Healed - Epithelialized Open Wound Status: No No No Wound Recurrence: Yes Yes Yes Pending A mputation on Presentation: 0.9x0.7x0.1 0x0x0 0.5x0.3x0.1 Measurements L x W x D (cm) 0.495 0 0.118 A (cm) : rea 0.049 0 0.012 Volume (cm) : 36.90%  100.00% 93.70% % Reduction in A rea: 79.20% 100.00% 96.80% % Reduction in Volume: Grade 3 Grade 3 Grade 3 Classification: Small None Present Medium Exudate A mount: Serosanguineous N/A Serosanguineous Exudate Type: red, brown N/A red, brown Exudate Color: Distinct, outline attached Distinct, outline attached Distinct, outline attached Wound Margin: Small (1-33%) None Present (0%) None Present (0%) Granulation A mount: Lawrence [name], Lawrence [name] ([id number]) [id number] Page 5 of 11 Pink N/A N/A Granulation Quality: Medium (34-66%) None Present (0%) Large (67-100%) Necrotic Amount: Fat Layer (Subcutaneous Tissue): Yes Fascia: No Fat Layer (Subcutaneous Tissue): Yes Exposed Structures: Bone: Yes Fat Layer (Subcutaneous Tissue): No Fascia: No Fascia: No Tendon: No Tendon: No Tendon: No Muscle: No Muscle: No Muscle: No Joint: No Joint: No Joint: No Bone: No Bone: No None Large (67-100%)  N/A Epithelialization: Treatment Notes Electronic Signature(s) Signed: 05/26/2023 3:20:29 PM By: Bonnell Public Entered By: Bonnell Public on 05/26/2023 08:30:05 -------------------------------------------------------------------------------- Multi-Disciplinary Care Plan Details Patient Name: Date of Service: [name], [name] 05/22/2023 9:15 A M Medical Record Number: [id number] Patient Account Number: [id number] Date of Birth/Sex: Treating RN: 1950/11/10 (72 y.o. Lawrence [name] Primary Care Kobey Sides: [name] Other Clinician: Referring Nic Lampe: Treating Olan Kurek/Extender: [name] in Treatment: 16 Active Inactive Wound/Skin Impairment Nursing Diagnoses: Knowledge deficit related to ulceration/compromised skin integrity Goals: Patient/caregiver will verbalize understanding of skin care regimen Date Initiated: 01/30/2023 Date Inactivated: 05/15/2023 Target Resolution Date: 05/15/2023 Goal Status: Met Ulcer/skin breakdown will have a volume reduction of 30% by week 4 Date Initiated: 01/30/2023 Date Inactivated: 03/06/2023 Target Resolution Date: 03/02/2023 Goal Status: Unmet Unmet Reason: comorbidities Ulcer/skin breakdown will have a volume reduction of 50% by week 8 Date Initiated: 01/30/2023 Date Inactivated: 04/10/2023 Target Resolution Date: 04/01/2023 Goal Status: Unmet Unmet Reason: comorbidities Ulcer/skin breakdown will have a volume reduction of 80% by week 12 Date Initiated: 01/30/2023 Date Inactivated: 05/08/2023 Target Resolution Date: 05/02/2023 Goal Status: Unmet Unmet Reason: comorbidities Ulcer/skin breakdown will heal within 14 weeks Date Initiated: 01/30/2023 Target Resolution Date: 06/01/2023 Goal Status: Active Interventions: Assess patient/caregiver ability to obtain necessary supplies Assess patient/caregiver ability to perform ulcer/skin care regimen upon admission and as needed Assess ulceration(s) every  visit Notes: Electronic Signature(s) Signed: 05/26/2023 3:20:29 PM By: Bonnell Public Entered By: Bonnell Public on 05/26/2023 08:34:15 Shark, Lawrence [name] ([id number]) [id number] Page 6 of 11 -------------------------------------------------------------------------------- Pain Assessment Details Patient Name: Date of Service: [name], [name] 05/22/2023 9:15 A M Medical Record Number: [id number] Patient Account Number: [id number] Date of Birth/Sex: Treating RN: [birth year] (72 y.o. Lawrence [name] Primary Care Taelar Gronewold: [name] Other Clinician: Referring Landis Cassaro: Treating Mekenna Finau/Extender: [name] in Treatment: 16 Active Problems Location of Pain Severity and Description of Pain Patient Has Paino No Site Locations Pain Management and Medication Current Pain Management: Electronic Signature(s) Signed: 05/26/2023 3:20:29 PM By: Bonnell Public Entered By: Bonnell Public on 05/26/2023 08:27:18 -------------------------------------------------------------------------------- Patient/Caregiver Education Details Patient Name: Date of Service: Lawrence Householder MS, [name] 7/19/2024andnbsp9:15 A M Medical Record Number: [id number] Patient Account Number: [id number] Date of Birth/Gender: Treating RN: May 03, 1951 (72 y.o. Lawrence [name] Primary Care Physician: [name] Other Clinician: Referring Physician: Treating Physician/Extender: Parks Neptune ([id number]) [id number] Page 7 of 11 Weeks in Treatment: 16 Education Assessment Education Provided To: Patient Education Topics Provided Wound/Skin Impairment: Handouts: Caring for Your Ulcer Responses: State content correctly Electronic Signature(s) Signed: 05/26/2023 3:20:29 PM By: Bonnell Public Entered By: Bonnell Public on  05/26/2023  08:34:32 -------------------------------------------------------------------------------- Wound Assessment Details Patient Name: Date of Service: [name], [name] 05/22/2023 9:15 A M Medical Record Number: [id number] Patient Account Number: [id number] Date of Birth/Sex: Treating RN: 1951/09/09 (72 y.o. Lawrence [name] Primary Care Taira Knabe: [name] Other Clinician: Referring Dody Smartt: Treating Lady Wisham/Extender: [name] in Treatment: 16 Wound Status Wound Number: 1 Primary Diabetic Wound/Ulcer of the Lower Extremity Etiology: Wound Location: Right T Second oe Wound Open Wounding Event: Gradually Appeared Status: Date Acquired: 11/03/2022 Comorbid Glaucoma, Coronary Artery Disease, Hypertension, Myocardial Weeks Of Treatment: 16 History: Infarction, Type II Diabetes, End Stage Renal Disease Clustered Wound: No Pending Amputation On Presentation Photos Wound Measurements Length: (cm) 0.9 Width: (cm) 0.7 Depth: (cm) 0.1 Area: (cm) 0.495 Volume: (cm) 0.049 % Reduction in Area: 36.9% % Reduction in Volume: 79.2% Epithelialization: None Wound Description Classification: Grade 3 Lawrence [name], Lawrence [name] ([id number]) Wound Margin: Distinct, outline attached Exudate Amount: Small Exudate Type: Serosanguineous Exudate Color: red, brown Foul Odor After Cleansing: No [id number] Page 8 of 11 Slough/Fibrino Yes Wound Bed Granulation Amount: Small (1-33%) Exposed Structure Granulation Quality: Pink Fascia Exposed: No Necrotic Amount: Medium (34-66%) Fat Layer (Subcutaneous Tissue) Exposed: Yes Necrotic Quality: Adherent Slough Tendon Exposed: No Muscle Exposed: No Joint Exposed: No Bone Exposed: Yes Electronic Signature(s) Signed: 05/26/2023 3:20:29 PM By: Bonnell Public Entered By: Bonnell Public on 05/26/2023 08:28:45 -------------------------------------------------------------------------------- Wound Assessment  Details Patient Name: Date of Service: [name], [name] 05/22/2023 9:15 A M Medical Record Number: [id number] Patient Account Number: [id number] Date of Birth/Sex: Treating RN: 08-25-1951 (72 y.o. Lawrence [name] Primary Care Zaid Tomes: [name] Other Clinician: Referring Cordelro Gautreau: Treating Preston Weill/Extender: [name] in Treatment: 16 Wound Status Wound Number: 2 Primary Diabetic Wound/Ulcer of the Lower Extremity Etiology: Wound Location: Right T Third oe Wound Healed - Epithelialized Wounding Event: Gradually Appeared Status: Date Acquired: 11/03/2022 Comorbid Glaucoma, Coronary Artery Disease, Hypertension, Myocardial Weeks Of Treatment: 16 History: Infarction, Type II Diabetes, End Stage Renal Disease Clustered Wound: No Pending Amputation On Presentation Photos Wound Measurements Length: (cm) Width: (cm) Depth: (cm) Area: (cm) Volume: (cm) 0 % Reduction in Area: 100% 0 % Reduction in Volume: 100% 0 Epithelialization: Large (67-100%) 0 0 Wound Description Classification: Grade 3 Lawrence [name], Lawrence [name] ([id number]) Wound Margin: Distinct, outline attached Exudate Amount: None Present Foul Odor After Cleansing: No [id number] Page 9 of 11 Slough/Fibrino No Wound Bed Granulation Amount: None Present (0%) Exposed Structure Necrotic Amount: None Present (0%) Fascia Exposed: No Fat Layer (Subcutaneous Tissue) Exposed: No Tendon Exposed: No Muscle Exposed: No Joint Exposed: No Bone Exposed: No Treatment Notes Wound #2 (Toe Third) Wound Laterality: Right Cleanser Peri-Wound Care Topical Primary Dressing Secondary Dressing Secured With Compression Wrap Compression Stockings Add-Ons Electronic Signature(s) Signed: 05/26/2023 3:20:29 PM By: Bonnell Public Entered By: Bonnell Public on 05/26/2023 08:29:19 -------------------------------------------------------------------------------- Wound Assessment  Details Patient Name: Date of Service: [name], [name] 05/22/2023 9:15 A M Medical Record Number: [id number] Patient Account Number: [id number] Date of Birth/Sex: Treating RN: 1951/05/05 (72 y.o. Lawrence [name] Primary Care Folashade Gamboa: [name] Other Clinician: Referring Celestina Gironda: Treating Park Beck/Extender: [name] in Treatment: 16 Wound Status Wound Number: 4 Primary Diabetic Wound/Ulcer of the Lower Extremity Etiology: Wound Location: Left T Third oe Wound Open Wounding Event: Gradually Appeared Status: Date Acquired: 12/05/2022 Comorbid Glaucoma, Coronary Artery Disease, Hypertension, Myocardial Weeks Of Treatment: 16 History: Infarction, Type II Diabetes, End Stage Renal Disease  Clustered Wound: No Pending Amputation On Presentation Photos Lawrence [name], Lawrence [name] ([id number]) [id number] Page 10 of 11 Wound Measurements Length: (cm) 0.5 Width: (cm) 0.3 Depth: (cm) 0.1 Area: (cm) 0.118 Volume: (cm) 0.012 % Reduction in Area: 93.7% % Reduction in Volume: 96.8% Wound Description Classification: Grade 3 Wound Margin: Distinct, outline attached Exudate Amount: Medium Exudate Type: Serosanguineous Exudate Color: red, brown Foul Odor After Cleansing: No Slough/Fibrino Yes Wound Bed Granulation Amount: None Present (0%) Exposed Structure Necrotic Amount: Large (67-100%) Fascia Exposed: No Necrotic Quality: Adherent Slough Fat Layer (Subcutaneous Tissue) Exposed: Yes Tendon Exposed: No Muscle Exposed: No Joint Exposed: No Bone Exposed: No Electronic Signature(s) Signed: 05/26/2023 3:20:29 PM By: Bonnell Public Entered By: Bonnell Public on 05/26/2023 08:29:43 -------------------------------------------------------------------------------- Vitals Details Patient Name: Date of Service: [name], [name] 05/22/2023 9:15 A M Medical Record Number: [id number] Patient Account Number: [id number] Date of Birth/Sex:  Treating RN: 1951/04/16 (72 y.o. Lawrence [name] Primary Care Keston Seever: [name] Other Clinician: Referring Omara Alcon: Treating Phinneas Shakoor/Extender: [name] in Treatment: 16 Vital Signs Time Taken: 09:15 Temperature (F): 97.4 Height (in): 70 Pulse (bpm): 76 Weight (lbs): 302 Respiratory Rate (breaths/min): 18 Body Mass Index (BMI): 43.3 Blood Pressure (mmHg): 126/67 Reference Range: 80 - 120 mg / dl Electronic Signature(s) Signed: 05/26/2023 3:20:29 PM By: Laqueta Jean ([id number]) ByBonnell Public [id number] Page 11 of 11 Signed: 05/26/2023 3:20:29 PM Entered By: Bonnell Public on 05/26/2023 08:27:11

## 2023-05-27 ENCOUNTER — Encounter: Payer: Medicare Other | Admitting: Internal Medicine

## 2023-05-28 ENCOUNTER — Encounter: Payer: Medicare Other | Admitting: Physician Assistant

## 2023-05-28 NOTE — Progress Notes (Addendum)
RONDLE, LOHSE ([id number]) [id number] Page 1 of 10 Visit Report for 05/22/2023 Chief Complaint Document Details Patient Name: Date of Service: [name], [name] 05/22/2023 9:15 A M Medical Record Number: [id number] Patient Account Number: [id number] Date of Birth/Sex: Treating RN: 01-Feb-1951 (72 y.o. Lawrence [name], [name] Primary Care Provider: [name] Other Clinician: Referring Provider: Treating Provider/Extender: [name] in Treatment: 16 Information Obtained from: Patient Chief Complaint Bilateral foot ulcers Electronic Signature(s) Signed: 06/01/2023 8:04:58 AM By: [name] Entered By: [name] on 06/01/2023 08:04:58 -------------------------------------------------------------------------------- Debridement Details Patient Name: Date of Service: [name], [name] 05/22/2023 9:15 A M Medical Record Number: [id number] Patient Account Number: [id number] Date of Birth/Sex: Treating RN: 1951/01/30 (72 y.o. Lawrence [name], [name] Primary Care Provider: [name] Other Clinician: Referring Provider: Treating Provider/Extender: [name] in Treatment: 16 Debridement Performed for Assessment: Wound #4 Left T Third oe Performed By: Physician [name], PA-C Debridement Type: Debridement Severity of Tissue Pre Debridement: Fat layer exposed Level of Consciousness (Pre-procedure): Awake and Alert Pre-procedure Verification/Time Out Yes - 09:24 Taken: Start Time: 09:24 Pain Control: Lidocaine 2% T opical Gel Percent of Wound Bed Debrided: 100% T Area Debrided (cm): otal 0.12 Tissue and other material debrided: Viable, Non-Viable, Slough, Subcutaneous, Slough Level: Skin/Subcutaneous Tissue Debridement Description: Excisional Instrument: Curette Bleeding: Minimum Hemostasis Achieved: Pressure End Time: 09:26 Procedural Pain: 0 DARRIEL, UTTER ([id number])  [id number] Page 2 of 10 Post Procedural Pain: 0 Response to Treatment: Procedure was tolerated well Level of Consciousness (Post- Awake and Alert procedure): Post Debridement Measurements of Total Wound Length: (cm) 0.5 Width: (cm) 0.3 Depth: (cm) 0.1 Volume: (cm) 0.012 Character of Wound/Ulcer Post Debridement: Improved Severity of Tissue Post Debridement: Fat layer exposed Post Procedure Diagnosis Same as Pre-procedure Electronic Signature(s) Signed: 05/26/2023 3:20:29 PM By: Bonnell Public Signed: 05/28/2023 4:06:59 PM By: [name] Entered By: Bonnell Public on 05/26/2023 08:31:31 -------------------------------------------------------------------------------- Debridement Details Patient Name: Date of Service: [name], [name] 05/22/2023 9:15 A M Medical Record Number: [id number] Patient Account Number: [id number] Date of Birth/Sex: Treating RN: 06-Aug-1951 (72 y.o. Lawrence [name], [name] Primary Care Provider: [name] Other Clinician: Referring Provider: Treating Provider/Extender: [name] in Treatment: 16 Debridement Performed for Assessment: Wound #1 Right T Second oe Performed By: Physician [name], PA-C Debridement Type: Debridement Severity of Tissue Pre Debridement: Bone involvement without necrosis Level of Consciousness (Pre-procedure): Awake and Alert Pre-procedure Verification/Time Out Yes - 09:24 Taken: Start Time: 09:24 Pain Control: Lidocaine 2% T opical Gel Percent of Wound Bed Debrided: 100% T Area Debrided (cm): otal 0.49 Tissue and other material debrided: Viable, Non-Viable, Slough, Subcutaneous, Slough Level: Skin/Subcutaneous Tissue Debridement Description: Excisional Instrument: Curette Bleeding: Minimum Hemostasis Achieved: Pressure End Time: 09:26 Procedural Pain: 0 Post Procedural Pain: 0 Response to Treatment: Procedure was tolerated well Level of Consciousness (Post-  Awake and Alert procedure): Post Debridement Measurements of Total Wound Length: (cm) 0.9 Width: (cm) 0.7 Depth: (cm) 0.1 Volume: (cm) 0.049 Character of Wound/Ulcer Post Debridement: Improved Severity of Tissue Post Debridement: Bone involvement without necrosis CRUISE, BAUMGARDNER ([id number]) [id number] Page 3 of 10 Post Procedure Diagnosis Same as Pre-procedure Electronic Signature(s) Signed: 05/26/2023 3:20:29 PM By: Bonnell Public Signed: 05/28/2023 4:06:59 PM By: [name] Entered By: Bonnell Public on 05/26/2023 08:32:10 -------------------------------------------------------------------------------- HPI Details Patient Name: Date of Service: [name], [name] 05/22/2023 9:15 A M Medical Record  Number: [id number] Patient Account Number: [id number] Date of Birth/Sex: Treating RN: April 16, 1951 (72 y.o. Lawrence [name], [name] Primary Care Provider: [name] Other Clinician: Referring Provider: Treating Provider/Extender: [name] in Treatment: 16 History of Present Illness HPI Description: 01-30-2023 upon evaluation today patient appears to be doing poorly currently in regard to his feet bilaterally. He is actually referral from Dr. Gala Lewandowsky who is a local podiatrist. Subsequently Dr. Logan Bores has been seeing him but wanted to refer him to [place] due to the fact that he is having difficulty healing in regard to his feet bilaterally. He has had several amputations. 1 back in the summer 2023 of the great toe right foot. Subsequently had a partial digit amputation of the second toe that was performed in November 2023. Subsequently since that time he had a hard time getting this to heal. Fortunately there does not appear to be any signs of infection has been using gentamicin currently as the treatment of choice. With that being said I feel like that we need to try to see about drying some of these wounds off of it which is my  biggest concern at this point. Also think that he could potentially have osteomyelitis of this right foot he has not had an x-ray of the left foot Emina suggest MRI right foot and x-ray left foot he is already had an x-ray of the right foot at Dr. Logan Bores office and that did not reveal obvious signs of osteomyelitis but he does have bone exposure in the distal portion of the second toe right foot digit. Patient does have a history significant for diabetes mellitus type 1, hypertension, peripheral vascular disease, chronic kidney disease stage IIIa. He also has undergone arterial study which was actually performed on 12-08-2021 and this shows that he actually has good arterial flow into the right lower extremity and left lower extremity for that matter. He had noncompressible ABIs but on the left he had a TBI of 0.68 and in regard to his flow it was actually better on the right even compared to the left with good pressures at all locations and again there does not appear to be any significant peripheral vascular disease that would prevent healing. 02-06-2023 upon evaluation today patient appears to be doing about the same in regard to his wounds. There is quite a bit of maceration of the great toe on the right foot does appear to be healed the remainder of the wounds all appear to be a little bit macerated. I think that he may need to change this dressing a little bit more frequently. We have been using a silver alginate dressing. 02-13-2023 upon evaluation today patient unfortunately is continuing to have some issues here with purulent drainage from his toes there is also an odor that both his wife and the nurse noted although I was not able to detect this due to my lack of smell. Nonetheless I do believe that this is something where we need to be a little more aggressive than the treatment of the wound to be perfectly honest. I discussed this with the patient today and he is in agreement with this plan. I am  going to send in those antibiotics for him today. 02-20-2023 upon evaluation today patient appears to be doing decently well in regard to his wounds he is tolerating the antibiotics without complication and overall seems to be doing well in that regard. Fortunately I do not see any signs of active infection locally nor systemically  at this time. 02-27-2023 upon evaluation today patient appears to be doing well currently in regard to his wound. He has been tolerating the dressing changes without complication. Fortunately there does not appear to be any signs of infection we are using silver alginate dressings and then subsequently the Band-Aids to secure in place. He also seems to be doing well with the hyperbarics at this point which is great news. 03-06-2023 upon evaluation today patient's wounds show signs of improvement although this is going slowly nonetheless we are making improvement here of the same. Fortunately I do not see any signs of active infection locally nor systemically which is great news. 03-13-2023 upon evaluation today patient appears to be doing well currently in regard to his wounds. He is actually showing signs of improvement he does have his tubes and so we should be good to get him started back on hyperbarics on Monday. Fortunately there does not appear to be any signs of active infection locally nor systemically which is great news. No fevers, chills, nausea, vomiting, or diarrhea. He does need refills of his medications he never got the prescription from [facility]. 03-20-2023 upon evaluation today patient appears to be doing well currently in regard to his wounds he is going require some sharp debridement today. Fortunately there does not appear to be any signs of active infection locally nor systemically which is great news. 5/24; this is a patient currently undergoing HBO for chronic osteomyelitis in the toes of his bilateral feet. Using silver alginate to wounds on the  right second remanent, right third and left third toes. Our intake nurse reports weeping fluid coming about of the major wounds on the right second and left third toes. 04-02-2023 upon evaluation today patient appears to be doing well currently in regard to his wounds. He is actually showing some signs of improvement and actually very pleased with where we stand compared to what we have been previous. Fortunately I do not see any signs of infection at this point. 04-10-2023 upon evaluation today patient's wounds actually are showing signs of excellent improvement today. I am actually very pleased with where we stand I JEVEN, TOPPER ([id number]) [id number] Page 4 of 10 think that he is making great progress and I think we are very close to seeing this completely turned around to where we can have new skin growing over top of this already the granulation tissue is skilled in I see no bone exposure on evaluation today which is also news. 04-17-2023 upon evaluation today patient appears to be doing well currently in regard to his wounds which are actually showing signs of improvement and very pleased with where we stand. Fortunately I do not see any evidence of active infection locally or systemically which is great news. No fevers, chills, nausea, vomiting, or diarrhea. 04-24-2023 upon evaluation today patient appears to be doing well currently in regard to his wounds. He is showing signs of continued and appropriate improvement. I am actually very pleased with where things stand today. I do not see any signs of active infection at this time which is great news. No fevers, chills, nausea, vomiting, or diarrhea. 05-01-2023 upon evaluation today patient's wounds are showing signs of improvement. With that being said he is going require some sharp debridement today. 05-08-2023 upon evaluation patient's wound bed actually showed signs at all locations of doing much better. Fortunately  there does not appear to be any evidence of active infection which is great and in general I  think that he is making excellent progress towards closure. Fortunately I do not see any evidence of infection locally or systemically which is great news. Of note the patient will be having a cardiac stress test coming up I believe next week. Following that he may be proceeding to catheterization. At this point we have the HBO on hold due to the cardiac issues I am not sure when or if we will get back to that to be perfectly honest. 05-15-2023 upon evaluation today patient appears to be doing well currently in regard to his wounds we unfortunately still have the hyperbaric oxygen therapy on hold due to the fact that he is at this point still being worked up by his cardiologist he has his first test which will be the stress test on Tuesday the 15th of this month. Thus and just a few days. Following that he is likely going to have to have a heart catheterization. All this has been have to be done and cleared before he could consider getting back into the chamber. He is aware of this and will just kind of keep things moving along in the meantime. 05-22-2023 this is a note that is being put in late secondary to the fact that computers were down across the world due to a [employer] outage. Therefore there may be some lack of detail compared to what would normally be present in the notes. With that being said upon evaluation patient actually appears to be making progress in regard to his wounds and actually very pleased at this point he does seem to have again the third toe right foot closed the second toe is still open and is going require some debridement as well as the third toe on the left foot. With that being said this third toe on the left foot is actually significantly improved. I am very pleased with where we stand currently. Electronic Signature(s) Signed: 06/01/2023 8:08:26 AM By: [name] Entered  By: [name] on 06/01/2023 08:08:26 -------------------------------------------------------------------------------- Physical Exam Details Patient Name: Date of Service: [name], [name] 05/22/2023 9:15 A M Medical Record Number: [id number] Patient Account Number: [id number] Date of Birth/Sex: Treating RN: 03-30-1951 (72 y.o. Lawrence [name], [name] Primary Care Provider: [name] Other Clinician: Referring Provider: Treating Provider/Extender: [name] in Treatment: 16 Constitutional Well-nourished and well-hydrated in no acute distress. Respiratory normal breathing without difficulty. Psychiatric this patient is able to make decisions and demonstrates good insight into disease process. Alert and Oriented x 3. pleasant and cooperative. Notes Upon inspection patient's wound bed actually did require some debridement to clearway necrotic debris and he tolerated the debridement today without complication. Postdebridement all wounds actually appear to be doing significantly better which is great news. Electronic Signature(s) Signed: 06/01/2023 8:08:41 AM By: [name] Entered By: [name] on 06/01/2023 08:08:41 [name] ([id number]) [id number] Page 5 of 10 -------------------------------------------------------------------------------- Physician Orders Details Patient Name: Date of Service: [name], [name] 05/22/2023 9:15 A M Medical Record Number: [id number] Patient Account Number: [id number] Date of Birth/Sex: Treating RN: July 18, 1951 (72 y.o. Lawrence [name], [name] Primary Care Provider: [name] Other Clinician: Referring Provider: Treating Provider/Extender: [name] in Treatment: [id number] Verbal / Phone Orders: No Diagnosis Coding Follow-up Appointments ppointment in 1 week. - end of week Return A Anesthetic (Use 'Patient Medications' Section for Anesthetic Order  Entry) Lidocaine applied to wound bed Edema Control - Lymphedema / Segmental Compressive Device /  Other Bilateral Lower Extremities Tubigrip double layer applied - size D Elevate, Exercise Daily and A void Standing for Long Periods of Time. Elevate legs to the level of the heart and pump ankles as often as possible Elevate leg(s) parallel to the floor when sitting. Off-Loading Open toe surgical shoe - right and left foot Wound Treatment Wound #1 - T Second oe Wound Laterality: Right Cleanser: Soap and Water 1 x Per Day/30 Days Discharge Instructions: Gently cleanse wound with antibacterial soap, rinse and pat dry prior to dressing wounds Prim Dressing: Hydrofera Blue Ready Transfer Foam, 2.5x2.5 (in/in) 1 x Per Day/30 Days ary Discharge Instructions: Apply Hydrofera Blue Ready to wound bed as directed Secondary Dressing: Coverlet Latex-Free Fabric Adhesive Dressings 1 x Per Day/30 Days Discharge Instructions: 1.5 x 2 Wound #4 - T Third oe Wound Laterality: Left Cleanser: Soap and Water 1 x Per Day/30 Days Discharge Instructions: Gently cleanse wound with antibacterial soap, rinse and pat dry prior to dressing wounds Prim Dressing: Hydrofera Blue Ready Transfer Foam, 2.5x2.5 (in/in) 1 x Per Day/30 Days ary Discharge Instructions: Apply Hydrofera Blue Ready to wound bed as directed Secondary Dressing: Coverlet Latex-Free Fabric Adhesive Dressings 1 x Per Day/30 Days Discharge Instructions: 1.5 x 2 Electronic Signature(s) Signed: 05/26/2023 3:20:29 PM By: Bonnell Public Signed: 05/28/2023 4:06:59 PM By: [name] Entered By: Bonnell Public on 05/26/2023 08:33:25 Fetterman, [name] ([id number]) [id number] Page 6 of 10 -------------------------------------------------------------------------------- Problem List Details Patient Name: Date of Service: [name], [name] 05/22/2023 9:15 A M Medical Record Number: [id number] Patient Account Number:  [id number] Date of Birth/Sex: Treating RN: 1951/04/08 (72 y.o. Lawrence [name], [name] Primary Care Provider: [name] Other Clinician: Referring Provider: Treating Provider/Extender: [name] in Treatment: 16 Active Problems ICD-10 Encounter Code Description Active Date MDM Diagnosis M86.371 Chronic multifocal osteomyelitis, right ankle and foot 02/13/2023 No Yes E10.621 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 01/30/2023 No Yes L97.522 Non-pressure chronic ulcer of other part of left foot with fat layer exposed 01/30/2023 No Yes L97.512 Non-pressure chronic ulcer of other part of right foot with fat layer exposed 01/30/2023 No Yes I25.10 Atherosclerotic heart disease of native coronary artery without angina pectoris 01/30/2023 No Yes I10 Essential (primary) hypertension 01/30/2023 No Yes I73.89 Other specified peripheral vascular diseases 01/30/2023 No Yes N18.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, stage 3a 01/30/2023 No Yes Inactive Problems Resolved Problems Electronic Signature(s) Signed: 06/01/2023 8:04:27 AM By: [name] Entered By: [name] on 06/01/2023 08:04:27 [name] ([id number]) [id number] Page 7 of 10 -------------------------------------------------------------------------------- Progress Note Details Patient Name: Date of Service: [name], [name] 05/22/2023 9:15 A M Medical Record Number: [id number] Patient Account Number: [id number] Date of Birth/Sex: Treating RN: Jan 22, 1951 (72 y.o. Lawrence [name], [name] Primary Care Provider: [name] Other Clinician: Referring Provider: Treating Provider/Extender: [name] in Treatment: 16 Subjective Chief Complaint Information obtained from Patient Bilateral foot ulcers History of Present Illness (HPI) 01-30-2023 upon evaluation today patient appears to be doing poorly currently in regard to his feet bilaterally. He is actually referral  from Dr. Gala Lewandowsky who is a local podiatrist. Subsequently Dr. Logan Bores has been seeing him but wanted to refer him to [place] due to the fact that he is having difficulty healing in regard to his feet bilaterally. He has had several amputations. 1 back in the summer 2023 of the great toe right foot. Subsequently had a partial digit amputation of the second toe  that was performed in November 2023. Subsequently since that time he had a hard time getting this to heal. Fortunately there does not appear to be any signs of infection has been using gentamicin currently as the treatment of choice. With that being said I feel like that we need to try to see about drying some of these wounds off of it which is my biggest concern at this point. Also think that he could potentially have osteomyelitis of this right foot he has not had an x-ray of the left foot Emina suggest MRI right foot and x-ray left foot he is already had an x-ray of the right foot at Dr. Logan Bores office and that did not reveal obvious signs of osteomyelitis but he does have bone exposure in the distal portion of the second toe right foot digit. Patient does have a history significant for diabetes mellitus type 1, hypertension, peripheral vascular disease, chronic kidney disease stage IIIa. He also has undergone arterial study which was actually performed on 12-08-2021 and this shows that he actually has good arterial flow into the right lower extremity and left lower extremity for that matter. He had noncompressible ABIs but on the left he had a TBI of 0.68 and in regard to his flow it was actually better on the right even compared to the left with good pressures at all locations and again there does not appear to be any significant peripheral vascular disease that would prevent healing. 02-06-2023 upon evaluation today patient appears to be doing about the same in regard to his wounds. There is quite a bit of maceration of the great toe on the right foot  does appear to be healed the remainder of the wounds all appear to be a little bit macerated. I think that he may need to change this dressing a little bit more frequently. We have been using a silver alginate dressing. 02-13-2023 upon evaluation today patient unfortunately is continuing to have some issues here with purulent drainage from his toes there is also an odor that both his wife and the nurse noted although I was not able to detect this due to my lack of smell. Nonetheless I do believe that this is something where we need to be a little more aggressive than the treatment of the wound to be perfectly honest. I discussed this with the patient today and he is in agreement with this plan. I am going to send in those antibiotics for him today. 02-20-2023 upon evaluation today patient appears to be doing decently well in regard to his wounds he is tolerating the antibiotics without complication and overall seems to be doing well in that regard. Fortunately I do not see any signs of active infection locally nor systemically at this time. 02-27-2023 upon evaluation today patient appears to be doing well currently in regard to his wound. He has been tolerating the dressing changes without complication. Fortunately there does not appear to be any signs of infection we are using silver alginate dressings and then subsequently the Band-Aids to secure in place. He also seems to be doing well with the hyperbarics at this point which is great news. 03-06-2023 upon evaluation today patient's wounds show signs of improvement although this is going slowly nonetheless we are making improvement here of the same. Fortunately I do not see any signs of active infection locally nor systemically which is great news. 03-13-2023 upon evaluation today patient appears to be doing well currently in regard to his wounds. He is  actually showing signs of improvement he does have his tubes and so we should be good to get him  started back on hyperbarics on Monday. Fortunately there does not appear to be any signs of active infection locally nor systemically which is great news. No fevers, chills, nausea, vomiting, or diarrhea. He does need refills of his medications he never got the prescription from [facility]. 03-20-2023 upon evaluation today patient appears to be doing well currently in regard to his wounds he is going require some sharp debridement today. Fortunately there does not appear to be any signs of active infection locally nor systemically which is great news. 5/24; this is a patient currently undergoing HBO for chronic osteomyelitis in the toes of his bilateral feet. Using silver alginate to wounds on the right second remanent, right third and left third toes. Our intake nurse reports weeping fluid coming about of the major wounds on the right second and left third toes. 04-02-2023 upon evaluation today patient appears to be doing well currently in regard to his wounds. He is actually showing some signs of improvement and actually very pleased with where we stand compared to what we have been previous. Fortunately I do not see any signs of infection at this point. 04-10-2023 upon evaluation today patient's wounds actually are showing signs of excellent improvement today. I am actually very pleased with where we stand I think that he is making great progress and I think we are very close to seeing this completely turned around to where we can have new skin growing over top of this already the granulation tissue is skilled in I see no bone exposure on evaluation today which is also news. 04-17-2023 upon evaluation today patient appears to be doing well currently in regard to his wounds which are actually showing signs of improvement and very pleased with where we stand. Fortunately I do not see any evidence of active infection locally or systemically which is great news. No fevers, chills, nausea, vomiting, or  diarrhea. 04-24-2023 upon evaluation today patient appears to be doing well currently in regard to his wounds. He is showing signs of continued and appropriate improvement. I am actually very pleased with where things stand today. I do not see any signs of active infection at this time which is great news. No fevers, CAILAN, GENERAL ([id number]) [id number] Page 8 of 10 chills, nausea, vomiting, or diarrhea. 05-01-2023 upon evaluation today patient's wounds are showing signs of improvement. With that being said he is going require some sharp debridement today. 05-08-2023 upon evaluation patient's wound bed actually showed signs at all locations of doing much better. Fortunately there does not appear to be any evidence of active infection which is great and in general I think that he is making excellent progress towards closure. Fortunately I do not see any evidence of infection locally or systemically which is great news. Of note the patient will be having a cardiac stress test coming up I believe next week. Following that he may be proceeding to catheterization. At this point we have the HBO on hold due to the cardiac issues I am not sure when or if we will get back to that to be perfectly honest. 05-15-2023 upon evaluation today patient appears to be doing well currently in regard to his wounds we unfortunately still have the hyperbaric oxygen therapy on hold due to the fact that he is at this point still being worked up by his cardiologist he has his first test which  will be the stress test on Tuesday the 15th of this month. Thus and just a few days. Following that he is likely going to have to have a heart catheterization. All this has been have to be done and cleared before he could consider getting back into the chamber. He is aware of this and will just kind of keep things moving along in the meantime. 05-22-2023 this is a note that is being put in late secondary to the  fact that computers were down across the world due to a [employer] outage. Therefore there may be some lack of detail compared to what would normally be present in the notes. With that being said upon evaluation patient actually appears to be making progress in regard to his wounds and actually very pleased at this point he does seem to have again the third toe right foot closed the second toe is still open and is going require some debridement as well as the third toe on the left foot. With that being said this third toe on the left foot is actually significantly improved. I am very pleased with where we stand currently. Objective Constitutional Well-nourished and well-hydrated in no acute distress. Vitals Time Taken: 9:15 AM, Height: 70 in, Weight: 302 lbs, BMI: 43.3, Temperature: 97.4 F, Pulse: 76 bpm, Respiratory Rate: 18 breaths/min, Blood Pressure: 126/67 mmHg. Respiratory normal breathing without difficulty. Psychiatric this patient is able to make decisions and demonstrates good insight into disease process. Alert and Oriented x 3. pleasant and cooperative. General Notes: Upon inspection patient's wound bed actually did require some debridement to clearway necrotic debris and he tolerated the debridement today without complication. Postdebridement all wounds actually appear to be doing significantly better which is great news. Integumentary (Hair, Skin) Wound #1 status is Open. Original cause of wound was Gradually Appeared. The date acquired was: 11/03/2022. The wound has been in treatment 16 weeks. The wound is located on the Right T Second. The wound measures 0.9cm length x 0.7cm width x 0.1cm depth; 0.495cm^2 area and 0.049cm^3 volume. There is oe bone and Fat Layer (Subcutaneous Tissue) exposed. There is a small amount of serosanguineous drainage noted. The wound margin is distinct with the outline attached to the wound base. There is small (1-33%) pink granulation within the wound  bed. There is a medium (34-66%) amount of necrotic tissue within the wound bed including Adherent Slough. Wound #2 status is Healed - Epithelialized. Original cause of wound was Gradually Appeared. The date acquired was: 11/03/2022. The wound has been in treatment 16 weeks. The wound is located on the Right T Third. The wound measures 0cm length x 0cm width x 0cm depth; 0cm^2 area and 0cm^3 volume. oe There is a none present amount of drainage noted. The wound margin is distinct with the outline attached to the wound base. There is no granulation within the wound bed. There is no necrotic tissue within the wound bed. Wound #4 status is Open. Original cause of wound was Gradually Appeared. The date acquired was: 12/05/2022. The wound has been in treatment 16 weeks. The wound is located on the Left T Third. The wound measures 0.5cm length x 0.3cm width x 0.1cm depth; 0.118cm^2 area and 0.012cm^3 volume. There is Fat oe Layer (Subcutaneous Tissue) exposed. There is a medium amount of serosanguineous drainage noted. The wound margin is distinct with the outline attached to the wound base. There is no granulation within the wound bed. There is a large (67-100%) amount of necrotic tissue within  the wound bed including Adherent Slough. 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 There was a Excisional Skin/Subcutaneous Tissue Debridement with a total area of 0.49 sq cm performed by [name], PA-C. With the following instrument(s): Curette to remove Viable and Non-Viable tissue/material. Material removed includes Subcutaneous Tissue and Slough and after achieving pain control using Lidocaine 2% Topical Gel. No specimens were taken. A time out was conducted at 09:24, prior to the start of the procedure. A Minimum amount of bleeding was controlled with Pressure. The procedure was tolerated well with a pain level of 0 throughout and a pain level of 0 following the procedure. Post Debridement Measurements: 0.9cm length x 0.7cm width x 0.1cm depth; 0.049cm^3 volume. Character of Wound/Ulcer Post Debridement is improved. Severity of Tissue Post Debridement is: Bone involvement without necrosis. Post procedure Diagnosis Wound #1: Same as Pre-Procedure Wound #4 Pre-procedure diagnosis of Wound #4 is a Diabetic Wound/Ulcer of the Lower Extremity located on the Left T Third .Severity of Tissue Pre Debridement is: oe Fat layer exposed. There was a Excisional Skin/Subcutaneous Tissue Debridement with a total area of 0.12 sq cm performed by [name], PA-C. With the following instrument(s): Curette to remove Viable and Non-Viable tissue/material. Material removed includes Subcutaneous Tissue and Slough and after achieving pain control using Lidocaine 2% Topical Gel. No specimens were taken. A time out was conducted at 09:24, prior to the start of the procedure. A Minimum amount of bleeding was controlled with Pressure. The procedure was tolerated well with a pain level of 0 throughout and a pain level of 0 following the procedure. Post Debridement Measurements: 0.5cm length x 0.3cm width x 0.1cm depth; 0.012cm^3 volume. Character of Wound/Ulcer Post Debridement is improved. Severity of  Tissue Post Debridement is: Fat layer exposed. Post procedure Diagnosis Wound #4: Same as Pre-Procedure Plan Follow-up Appointments: Return Appointment in 1 week. - end of week Anesthetic (Use 'Patient Medications' Section for Anesthetic Order Entry): Lidocaine applied to wound bed Edema Control - Lymphedema / Segmental Compressive Device / Other: Tubigrip double layer applied - size D Elevate, Exercise Daily and Avoid Standing for Long Periods of Time. Elevate legs to the level of the heart and pump ankles as often as possible Elevate leg(s) parallel to the floor when sitting. Off-Loading: Open toe surgical shoe - right and left foot WOUND #1: - T Second Wound Laterality: Right oe Cleanser: Soap and Water 1 x Per Day/30 Days Discharge Instructions: Gently cleanse wound with antibacterial soap, rinse and pat dry prior to dressing wounds Prim Dressing: Hydrofera Blue Ready Transfer Foam, 2.5x2.5 (in/in) 1 x Per Day/30 Days ary Discharge Instructions: Apply Hydrofera Blue Ready to wound bed as directed Secondary Dressing: Coverlet Latex-Free Fabric Adhesive Dressings 1 x Per Day/30 Days Discharge Instructions: 1.5 x 2 WOUND #4: - T Third Wound Laterality: Left oe Cleanser: Soap and Water 1 x Per Day/30 Days Discharge Instructions: Gently  cleanse wound with antibacterial soap, rinse and pat dry prior to dressing wounds Prim Dressing: Hydrofera Blue Ready Transfer Foam, 2.5x2.5 (in/in) 1 x Per Day/30 Days ary Discharge Instructions: Apply Hydrofera Blue Ready to wound bed as directed Secondary Dressing: Coverlet Latex-Free Fabric Adhesive Dressings 1 x Per Day/30 Days Discharge Instructions: 1.5 x 2 1. Based on what I am seeing I do believe that the patient is making good headway towards complete closure which is great news and in general I think that removing in a appropriate direction here. 2. I am good recommend to continue with the [facility] for the time being. 3 and also can  recommend we continue to monitor for any signs of infection or worsening obviously if anything changes he knows contact the office and let me know. We will see patient back for reevaluation in 1 week here in the clinic. If anything worsens or changes patient will contact our office for additional recommendations. Electronic Signature(s) Signed: 06/01/2023 8:09:23 AM By: [name] Entered By: [name] on 06/01/2023 08:09:23 [name] ([id number]) [id number] Page 10 of 10 -------------------------------------------------------------------------------- SuperBill Details Patient Name: Date of Service: [name], [name] 05/22/2023 Medical Record Number: [id number] Patient Account Number: [id number] Date of Birth/Sex: Treating RN: [birth year] (72 y.o. Lawrence [name], [name] Primary Care Provider: [name] Other Clinician: Referring Provider: Treating Provider/Extender: [name] in Treatment: 16 Diagnosis Coding ICD-10 Codes Code Description [phone] Chronic multifocal osteomyelitis, right ankle and foot E10.621 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 I25.10 Atherosclerotic heart disease of native coronary artery without angina pectoris I10 Essential (primary) hypertension I73.89 Other specified peripheral vascular diseases N18.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, stage 3a Facility Procedures : CPT4 Code: [id number] Description: 11042 - DEB SUBQ TISSUE 20 SQ CM/< ICD-10 Diagnosis Description 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 Modifier: Quantity: 1 Physician Procedures : CPT4 Code Description Modifier [id number] 11042 - WC PHYS  SUBQ TISS 20 SQ CM ICD-10 Diagnosis Description L97.522 Non-pressure chronic ulcer of other part of left foot with fat layer exposed L97.512 Non-pressure chronic ulcer of other part of right foot  with fat layer exposed Quantity: 1 Electronic Signature(s) Signed: 05/26/2023 3:20:29 PM By: Bonnell Public Signed: 05/28/2023 4:06:59 PM By: [name] Entered By: Bonnell Public on 05/26/2023 08:33:55

## 2023-05-29 ENCOUNTER — Encounter: Payer: Medicare Other | Admitting: Internal Medicine

## 2023-05-29 DIAGNOSIS — E10621 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: Secondary | ICD-10-CM | POA: Diagnosis not present

## 2023-05-29 NOTE — Progress Notes (Addendum)
ROBERTT, WHITTY ([id number]) [id number] Page 1 of 10 Visit Report for 05/29/2023 Arrival Information Details Patient Name: Date of Service: [name], [name] 05/29/2023 9:15 A M Medical Record Number: [id number] Patient Account Number: [id number] Date of Birth/Sex: Treating RN: 1951/04/03 (72 y.o. [name], [name] Primary Care Andrus Sharp: [name] Other Clinician: Referring Germaine Shenker: Treating Francelia Mclaren/Extender: Chauncey Mann, MICHA EL Albertine Grates, Dorene Grebe in Treatment: 17 Visit Information History Since Last Visit All ordered tests and consults were completed: No Patient Arrived: [name] Added or deleted any medications: No Arrival Time: 09:18 Hospitalized since last visit: No Accompanied By: spouse Has Dressing in Place as Prescribed: Yes Transfer Assistance: None Pain Present Now: No Patient Identification Verified: Yes Secondary Verification Process Completed: Yes Patient Requires Transmission-Based Precautions: No Patient Has Alerts: No Electronic Signature(s) Signed: 05/29/2023 9:44:04 AM By: Bonnell Public Entered By: Bonnell Public on 05/29/2023 09:20:09 -------------------------------------------------------------------------------- Clinic Level of Care Assessment Details Patient Name: Date of Service: [name], [name] 05/29/2023 9:15 A M Medical Record Number: [id number] Patient Account Number: [id number] Date of Birth/Sex: Treating RN: 1950-11-26 (72 y.o. [name], [name] Primary Care Chasiti Waddington: [name] Other Clinician: Referring Massiah Longanecker: Treating Amaris Garrette/Extender: RO BSO Dorris Carnes, MICHA EL Mechele Claude in Treatment: 17 Clinic Level of Care Assessment Items TOOL 1 Quantity Score []  - 0 Use when EandM and Procedure is performed on INITIAL visit ASSESSMENTS - Nursing Assessment / Reassessment []  - 0 General Physical Exam (combine w/ comprehensive assessment (listed just below) when performed on new pt. evals) []  -  0 Comprehensive Assessment (HX, ROS, Risk Assessments, Wounds Hx, etc.) ASSESSMENTS - Wound and Skin Assessment / Reassessment []  - 0 Dermatologic / Skin Assessment (not related to wound area) ASSESSMENTS - Ostomy and/or Continence Assessment and Care SRULY, BRESSI ([id number]) [id number] Page 2 of 10 []  - 0 Incontinence Assessment and Management []  - 0 Ostomy Care Assessment and Management (repouching, etc.) PROCESS - Coordination of Care []  - 0 Simple Patient / Family Education for ongoing care []  - 0 Complex (extensive) Patient / Family Education for ongoing care []  - 0 Staff obtains Chiropractor, Records, T Results / Process Orders est []  - 0 Staff telephones HHA, Nursing Homes / Clarify orders / etc []  - 0 Routine Transfer to another Facility (non-emergent condition) []  - 0 Routine Hospital Admission (non-emergent condition) []  - 0 New Admissions / Manufacturing engineer / Ordering NPWT Apligraf, etc. , []  - 0 Emergency Hospital Admission (emergent condition) PROCESS - Special Needs []  - 0 Pediatric / Minor Patient Management []  - 0 Isolation Patient Management []  - 0 Hearing / Language / Visual special needs []  - 0 Assessment of Community assistance (transportation, D/C planning, etc.) []  - 0 Additional assistance / Altered mentation []  - 0 Support Surface(s) Assessment (bed, cushion, seat, etc.) INTERVENTIONS - Miscellaneous []  - 0 External ear exam []  - 0 Patient Transfer (multiple staff / Nurse, adult / Similar devices) []  - 0 Simple Staple / Suture removal (25 or less) []  - 0 Complex Staple / Suture removal (26 or more) []  - 0 Hypo/Hyperglycemic Management (do not check if billed separately) []  - 0 Ankle / Brachial Index (ABI) - do not check if billed separately Has the patient been seen at the hospital within the last three years: Yes Total Score: 0 Level Of Care: ____ Electronic Signature(s) Signed: 05/29/2023 1:14:15 PM  By: Bonnell Public Entered By: Bonnell Public on 05/29/2023 09:55:50 -------------------------------------------------------------------------------- Encounter Discharge Information Details  Patient Name: Date of Service: [name], [name] 05/29/2023 9:15 A M Medical Record Number: [id number] Patient Account Number: [id number] Date of Birth/Sex: Treating RN: 09-14-1951 (71 y.o. [name], [name] Primary Care Jacier Gladu: [name] Other Clinician: Referring Hira Trent: Treating Kross Swallows/Extender: RO BSO N, MICHA EL Albertine Grates, Dorene Grebe in Treatment: [phone] Encounter Discharge Information Items Post Procedure Vitals Discharge Condition: Stable Temperature (F): 97.6 FAWZI, LAVINDER ([id number]) [id number] Page 3 of 10 Ambulatory Status: Cane Pulse (bpm): 70 Discharge Destination: Home Respiratory Rate (breaths/min): 18 Transportation: Private Auto Blood Pressure (mmHg): 129/70 Accompanied By: spouse Schedule Follow-up Appointment: Yes Clinical Summary of Care: Electronic Signature(s) Signed: 05/29/2023 1:14:15 PM By: Bonnell Public Entered By: Bonnell Public on 05/29/2023 10:03:08 -------------------------------------------------------------------------------- Lower Extremity Assessment Details Patient Name: Date of Service: [name], [name] 05/29/2023 9:15 A M Medical Record Number: [id number] Patient Account Number: [id number] Date of Birth/Sex: Treating RN: Mar 24, 1951 (72 y.o. [name], [name] Primary Care Shaylinn Hladik: [name] Other Clinician: Referring Felder Lebeda: Treating Eriq Hufford/Extender: RO BSO N, MICHA EL Albertine Grates, Katherine Weeks in Treatment: 17 Edema Assessment Assessed: [Left: No] [Right: No] Edema: [Left: Yes] [Right: Yes] Calf Left: Right: Point of Measurement: 35 cm From Medial Instep 43.5 cm 44 cm Ankle Left: Right: Point of Measurement: 11 cm From Medial Instep 28 cm 30 cm Vascular Assessment Pulses: Dorsalis  Pedis Palpable: [Left:Yes] [Right:Yes] Electronic Signature(s) Signed: 05/29/2023 9:44:04 AM By: Bonnell Public Entered By: Bonnell Public on 05/29/2023 09:33:33 Multi Wound Chart Details -------------------------------------------------------------------------------- [name] ([id number]) [id number] Page 4 of 10 Patient Name: Date of Service: [name], [name] 05/29/2023 9:15 A M Medical Record Number: [id number] Patient Account Number: [id number] Date of Birth/Sex: Treating RN: [birth year] (72 y.o. [name], [name] Primary Care Teancum Brule: [name] Other Clinician: Referring Kya Mayfield: Treating Kamonte Mcmichen/Extender: RO BSO N, MICHA EL Albertine Grates, Dorene Grebe in Treatment: 17 Vital Signs Height(in): 70 Pulse(bpm): 70 Weight(lbs): 302 Blood Pressure(mmHg): 129/70 Body Mass Index(BMI): 43.3 Temperature(F): 97.6 Respiratory Rate(breaths/min): 18 Wound Assessments Wound Number: 1 4 N/A Photos: N/A Right T Second oe Left T Third oe N/A Wound Location: Gradually Appeared Gradually Appeared N/A Wounding Event: Diabetic Wound/Ulcer of the Lower Diabetic Wound/Ulcer of the Lower N/A Primary Etiology: Extremity Extremity Glaucoma, Coronary Artery Disease, Glaucoma, Coronary Artery Disease, N/A Comorbid History: Hypertension, Myocardial Infarction, Hypertension, Myocardial Infarction, Type II Diabetes, End Stage Renal Type II Diabetes, End Stage Renal Disease Disease 11/03/2022 12/05/2022 N/A Date Acquired: 17 17 N/A Weeks of Treatment: Open Open N/A Wound Status: No No N/A Wound Recurrence: Yes Yes N/A Pending A mputation on Presentation: 0.7x0.8x0 0.4x0.4x0.1 N/A Measurements L x W x D (cm) 0.44 0.126 N/A A (cm) : rea 0.044 0.013 N/A Volume (cm) : 43.90% 93.30% N/A % Reduction in A rea: 81.40% 96.60% N/A % Reduction in Volume: Grade 3 Grade 3 N/A Classification: Small Medium N/A Exudate A mount: Serosanguineous Serosanguineous  N/A Exudate Type: red, brown red, brown N/A Exudate Color: Distinct, outline attached Distinct, outline attached N/A Wound Margin: Large (67-100%) None Present (0%) N/A Granulation A mount: Pink N/A N/A Granulation Quality: Small (1-33%) Large (67-100%) N/A Necrotic A mount: Fat Layer (Subcutaneous Tissue): Yes Fat Layer (Subcutaneous Tissue): Yes N/A Exposed Structures: Fascia: No Fascia: No Tendon: No Tendon: No Muscle: No Muscle: No Joint: No Joint: No Bone: No Bone: No None N/A N/A Epithelialization: Treatment Notes Electronic Signature(s) Signed: 05/29/2023 9:44:04 AM By: Bonnell Public Entered By: Bonnell Public on 05/29/2023 09:33:49  BENAIAH, MIGNEAULT ([id number]) [id number] Page 5 of 10 -------------------------------------------------------------------------------- Multi-Disciplinary Care Plan Details Patient Name: Date of Service: [name], [name] 05/29/2023 9:15 A M Medical Record Number: [id number] Patient Account Number: [id number] Date of Birth/Sex: Treating RN: 1951-09-21 (72 y.o. [name], [name] Primary Care Philomina Leon: [name] Other Clinician: Referring Aron Needles: Treating Kaeden Mester/Extender: RO BSO N, MICHA EL Albertine Grates, Dorene Grebe in Treatment: 17 Active Inactive Wound/Skin Impairment Nursing Diagnoses: Knowledge deficit related to ulceration/compromised skin integrity Goals: Patient/caregiver will verbalize understanding of skin care regimen Date Initiated: 01/30/2023 Date Inactivated: 05/15/2023 Target Resolution Date: 05/15/2023 Goal Status: Met Ulcer/skin breakdown will have a volume reduction of 30% by week 4 Date Initiated: 01/30/2023 Date Inactivated: 03/06/2023 Target Resolution Date: 03/02/2023 Goal Status: Unmet Unmet Reason: comorbidities Ulcer/skin breakdown will have a volume reduction of 50% by week 8 Date Initiated: 01/30/2023 Date Inactivated: 04/10/2023 Target Resolution Date: 04/01/2023 Goal  Status: Unmet Unmet Reason: comorbidities Ulcer/skin breakdown will have a volume reduction of 80% by week 12 Date Initiated: 01/30/2023 Date Inactivated: 05/08/2023 Target Resolution Date: 05/02/2023 Goal Status: Unmet Unmet Reason: comorbidities Ulcer/skin breakdown will heal within 14 weeks Date Initiated: 01/30/2023 Target Resolution Date: 06/01/2023 Goal Status: Active Interventions: Assess patient/caregiver ability to obtain necessary supplies Assess patient/caregiver ability to perform ulcer/skin care regimen upon admission and as needed Assess ulceration(s) every visit Notes: Electronic Signature(s) Signed: 05/29/2023 1:14:15 PM By: Bonnell Public Entered By: Bonnell Public on 05/29/2023 09:56:12 -------------------------------------------------------------------------------- Pain Assessment Details Patient Name: Date of Service: [name], [name] 05/29/2023 9:15 A M Medical Record Number: [id number] Patient Account Number: [id number] BILLY, FORCE ([id number]) [id number] Page 6 of 10 Date of Birth/Sex: Treating RN: 15-Feb-1951 (72 y.o. [name], [name] Primary Care Neizan Debruhl: Other Clinician: [name] Referring Teliah Buffalo: Treating Alfard Cochrane/Extender: Chauncey Mann, MICHA EL Mechele Claude in Treatment: 17 Active Problems Location of Pain Severity and Description of Pain Patient Has Paino No Site Locations Pain Management and Medication Current Pain Management: Electronic Signature(s) Signed: 05/29/2023 9:44:04 AM By: Bonnell Public Entered By: Bonnell Public on 05/29/2023 09:21:54 -------------------------------------------------------------------------------- Patient/Caregiver Education Details Patient Name: Date of Service: [name] MS, [name] 7/26/2024andnbsp9:15 A M Medical Record Number: [id number] Patient Account Number: [id number] Date of Birth/Gender: Treating RN: 11-Sep-1951 (72 y.o. [name], [name] Primary Care Physician:  [name] Other Clinician: Referring Physician: Treating Physician/Extender: RO BSO Dorris Carnes, MICHA EL Mechele Claude in Treatment: 17 Education Assessment Education Provided To: Patient Education Topics Provided Wound Debridement: Handouts: Wound Debridement Methods: Explain/Verbal Responses: State content correctly Wound/Skin Impairment: Handouts: Caring for Your Ulcer Methods: Explain/Verbal ARYAAN, CAMPESE ([id number]) [id number] Page 7 of 10 Responses: State content correctly Electronic Signature(s) Signed: 05/29/2023 1:14:15 PM By: Bonnell Public Entered By: Bonnell Public on 05/29/2023 09:56:30 -------------------------------------------------------------------------------- Wound Assessment Details Patient Name: Date of Service: [name], [name] 05/29/2023 9:15 A M Medical Record Number: [id number] Patient Account Number: [id number] Date of Birth/Sex: Treating RN: 1951/10/04 (72 y.o. [name], [name] Primary Care Jihan Mellette: [name] Other Clinician: Referring Sihaam Chrobak: Treating Tearsa Kowalewski/Extender: RO BSO N, MICHA EL Albertine Grates, Katherine Weeks in Treatment: 17 Wound Status Wound Number: 1 Primary Diabetic Wound/Ulcer of the Lower Extremity Etiology: Wound Location: Right T Second oe Wound Open Wounding Event: Gradually Appeared Status: Date Acquired: 11/03/2022 Comorbid Glaucoma, Coronary Artery Disease, Hypertension, Myocardial Weeks Of Treatment: 17 History: Infarction, Type II Diabetes, End Stage Renal Disease Clustered Wound: No Pending Amputation On Presentation Photos  Wound Measurements Length: (cm) 0.7 Width: (cm) 0.8 Depth: (cm) 0.2 Area: (cm) 0.44 Volume: (cm) 0.088 % Reduction in Area: 43.9% % Reduction in Volume: 62.7% Epithelialization: None Wound Description Classification: Grade 3 Wound Margin: Distinct, outline attached Exudate Amount: Small Exudate Type: Serosanguineous Exudate Color: red,  brown Foul Odor After Cleansing: No Slough/Fibrino Yes Wound Bed Granulation Amount: Large (67-100%) Exposed Structure Granulation Quality: Pink Fascia Exposed: No Necrotic Amount: Small (1-33%) Fat Layer (Subcutaneous Tissue) Exposed: Yes Tendon Exposed: No Muscle Exposed: No JAYQUON, SHARRETT ([id number]) [id number] Page 8 of 10 Joint Exposed: No Bone Exposed: No Treatment Notes Wound #1 (Toe Second) Wound Laterality: Right Cleanser Soap and Water Discharge Instruction: Gently cleanse wound with antibacterial soap, rinse and pat dry prior to dressing wounds Peri-Wound Care Topical Primary Dressing Hydrofera Blue Ready Transfer Foam, 2.5x2.5 (in/in) Discharge Instruction: Apply Hydrofera Blue Ready to wound bed as directed Secondary Dressing Coverlet Latex-Free Fabric Adhesive Dressings Discharge Instruction: 1.5 x 2 Secured With Compression Wrap Compression Stockings Add-Ons Electronic Signature(s) Signed: 05/29/2023 1:14:15 PM By: Bonnell Public Previous Signature: 05/29/2023 9:44:04 AM Version By: Bonnell Public Entered By: Bonnell Public on 05/29/2023 09:53:50 -------------------------------------------------------------------------------- Wound Assessment Details Patient Name: Date of Service: [name], [name] 05/29/2023 9:15 A M Medical Record Number: [id number] Patient Account Number: [id number] Date of Birth/Sex: Treating RN: [birth year] (72 y.o. [name], [name] Primary Care Daimon Kean: [name] Other Clinician: Referring Roan Miklos: Treating Broden Holt/Extender: RO BSO N, MICHA EL Albertine Grates, Katherine Weeks in Treatment: 17 Wound Status Wound Number: 4 Primary Diabetic Wound/Ulcer of the Lower Extremity Etiology: Wound Location: Left T Third oe Wound Open Wounding Event: Gradually Appeared Status: Date Acquired: 12/05/2022 Comorbid Glaucoma, Coronary Artery Disease, Hypertension, Myocardial Weeks Of Treatment: 17 History:  Infarction, Type II Diabetes, End Stage Renal Disease Clustered Wound: No Pending Amputation On Presentation Photos TAVARI, COVALT ([id number]) [id number] Page 9 of 10 Wound Measurements Length: (cm) 0.4 Width: (cm) 0.4 Depth: (cm) 0.1 Area: (cm) 0.126 Volume: (cm) 0.013 % Reduction in Area: 93.3% % Reduction in Volume: 96.6% Wound Description Classification: Grade 3 Wound Margin: Distinct, outline attached Exudate Amount: Medium Exudate Type: Serosanguineous Exudate Color: red, brown Foul Odor After Cleansing: No Slough/Fibrino Yes Wound Bed Granulation Amount: None Present (0%) Exposed Structure Necrotic Amount: Large (67-100%) Fascia Exposed: No Necrotic Quality: Adherent Slough Fat Layer (Subcutaneous Tissue) Exposed: Yes Tendon Exposed: No Muscle Exposed: No Joint Exposed: No Bone Exposed: No Treatment Notes Wound #4 (Toe Third) Wound Laterality: Left Cleanser Soap and Water Discharge Instruction: Gently cleanse wound with antibacterial soap, rinse and pat dry prior to dressing wounds Peri-Wound Care Topical Primary Dressing Hydrofera Blue Ready Transfer Foam, 2.5x2.5 (in/in) Discharge Instruction: Apply Hydrofera Blue Ready to wound bed as directed Secondary Dressing Coverlet Latex-Free Fabric Adhesive Dressings Discharge Instruction: 1.5 x 2 Secured With Compression Wrap Compression Stockings Add-Ons Electronic Signature(s) Signed: 05/29/2023 9:44:04 AM By: Bonnell Public Entered By: Bonnell Public on 05/29/2023 09:30:11 Saulter, [name] ([id number]) [id number] Page 10 of 10 -------------------------------------------------------------------------------- Vitals Details Patient Name: Date of Service: [name], [name] 05/29/2023 9:15 A M Medical Record Number: [id number] Patient Account Number: [id number] Date of Birth/Sex: Treating RN: December 19, 1950 (72 y.o. [name], [name] Primary Care Kayana Thoen:  [name] Other Clinician: Referring Zabrina Brotherton: Treating Chimaobi Casebolt/Extender: RO BSO N, MICHA EL Mechele Claude in Treatment: 17 Vital Signs Time Taken: 09:20 Temperature (F): 97.6 Height (in): 70 Pulse (bpm): 70 Weight (lbs): 302 Respiratory Rate (breaths/min): 18 Body Mass  Index (BMI): 43.3 Blood Pressure (mmHg): 129/70 Reference Range: 80 - 120 mg / dl Electronic Signature(s) Signed: 05/29/2023 9:44:04 AM By: Bonnell Public Entered By: Bonnell Public on 05/29/2023 09:20:53

## 2023-05-29 NOTE — Progress Notes (Signed)
Lawrence [name], Lawrence [name] ([id number]) [id number] Page 1 of 9 Visit Report for 05/29/2023 Debridement Details Patient Name: Date of Service: Lawrence [name], Lawrence RLA ND W. 05/29/2023 9:15 Lawrence M Medical Record Number: [id number] Patient Account Number: [id number] Date of Birth/Sex: Treating RN: 06/10/1951 (72 y.o. Lawrence [name], Lawrence [name] Primary Care Provider: [name] Other Clinician: Referring Provider: Treating Provider/Extender: Chauncey Mann, MICHA EL Mechele Claude in Treatment: 17 Debridement Performed for Assessment: Wound #1 Right T Second oe Performed By: Physician Maxwell Caul, MD Debridement Type: Debridement Severity of Tissue Pre Debridement: Fat layer exposed Level of Consciousness (Pre-procedure): Awake and Alert Pre-procedure Verification/Time Out Yes - 09:52 Taken: Start Time: 09:52 Pain Control: Lidocaine 2% T opical Gel Percent of Wound Bed Debrided: 100% T Area Debrided (cm): otal 0.44 Tissue and other material debrided: Viable, Non-Viable, Callus, Skin: Dermis Level: Skin/Dermis Debridement Description: Selective/Open Wound Instrument: Curette Bleeding: Minimum End Time: 09:53 Procedural Pain: 0 Post Procedural Pain: 0 Response to Treatment: Procedure was tolerated well Level of Consciousness (Post- Awake and Alert procedure): Post Debridement Measurements of Total Wound Length: (cm) 0.7 Width: (cm) 0.8 Depth: (cm) 0.2 Volume: (cm) 0.088 Character of Wound/Ulcer Post Debridement: Improved Severity of Tissue Post Debridement: Fat layer exposed Post Procedure Diagnosis Same as Pre-procedure Electronic Signature(s) Signed: 05/29/2023 1:14:15 PM By: Bonnell Public Signed: 05/29/2023 2:26:28 PM By: Baltazar Najjar MD Entered By: Baltazar Najjar on 05/29/2023 10:23:03 Lawrence [name] ([id number]) [id number] Page 2 of  9 -------------------------------------------------------------------------------- Debridement Details Patient Name: Date of Service: Lawrence [name], Lawrence RLA ND W. 05/29/2023 9:15 Lawrence M Medical Record Number: [id number] Patient Account Number: [id number] Date of Birth/Sex: Treating RN: [birth year] (72 y.o. Lawrence [name], Lawrence [name] Primary Care Provider: [name] Other Clinician: Referring Provider: Treating Provider/Extender: Chauncey Mann, MICHA EL Mechele Claude in Treatment: 17 Debridement Performed for Assessment: Wound #4 Left T Third oe Performed By: Physician Maxwell Caul, MD Debridement Type: Debridement Severity of Tissue Pre Debridement: Fat layer exposed Level of Consciousness (Pre-procedure): Awake and Alert Pre-procedure Verification/Time Out Yes - 09:52 Taken: Start Time: 09:54 Pain Control: Lidocaine 2% T opical Gel Percent of Wound Bed Debrided: 100% T Area Debrided (cm): otal 0.13 Tissue and other material debrided: Viable, Non-Viable, Callus, Skin: Dermis Level: Skin/Dermis Debridement Description: Selective/Open Wound Instrument: Curette Bleeding: Minimum End Time: 09:55 Procedural Pain: 0 Post Procedural Pain: 0 Response to Treatment: Procedure was tolerated well Level of Consciousness (Post- Awake and Alert procedure): Post Debridement Measurements of Total Wound Length: (cm) 0.4 Width: (cm) 0.4 Depth: (cm) 0.1 Volume: (cm) 0.013 Character of Wound/Ulcer Post Debridement: Improved Severity of Tissue Post Debridement: Fat layer exposed Post Procedure Diagnosis Same as Pre-procedure Electronic Signature(s) Signed: 05/29/2023 1:14:15 PM By: Bonnell Public Signed: 05/29/2023 2:26:28 PM By: Baltazar Najjar MD Entered By: Baltazar Najjar on 05/29/2023 10:23:10 -------------------------------------------------------------------------------- HPI Details Patient Name: Date of Service: Lawrence Householder [name], Lawrence RLA ND W. 05/29/2023 9:15 Lawrence Marya Amsler ([id number])  [id number] Page 3 of 9 Medical Record Number: [id number] Patient Account Number: [id number] Date of Birth/Sex: Treating RN: [birth year] (72 y.o. Lawrence [name], Lawrence [name] Primary Care Provider: [name] Other Clinician: Referring Provider: Treating Provider/Extender: RO BSO N, MICHA EL Mechele Claude in Treatment: 17 History of Present Illness HPI Description: 01-30-2023 upon evaluation today patient appears to be doing poorly currently in regard to his feet bilaterally. He is actually referral from Dr. Gala Lewandowsky who is Lawrence local podiatrist. Subsequently Dr. Logan Bores has been seeing him  but wanted to refer him to [place] due to the fact that he is having difficulty healing in regard to his feet bilaterally. He has had several amputations. 1 back in the summer 2023 of the great toe right foot. Subsequently had Lawrence partial digit amputation of the second toe that was performed in November 2023. Subsequently since that time he had Lawrence hard time getting this to heal. Fortunately there does not appear to be any signs of infection has been using gentamicin currently as the treatment of choice. With that being said I feel like that we need to try to see about drying some of these wounds off of it which is my biggest concern at this point. Also think that he could potentially have osteomyelitis of this right foot he has not had an x-ray of the left foot Emina suggest MRI right foot and x-ray left foot he is already had an x-ray of the right foot at Dr. Logan Bores office and that did not reveal obvious signs of osteomyelitis but he does have bone exposure in the distal portion of the second toe right foot digit. Patient does have Lawrence history significant for diabetes mellitus type 1, hypertension, peripheral vascular disease, chronic kidney disease stage IIIa. He also has undergone arterial study which was actually performed on 12-08-2021 and this shows that he actually has good arterial flow  into the right lower extremity and left lower extremity for that matter. He had noncompressible ABIs but on the left he had Lawrence TBI of 0.68 and in regard to his flow it was actually better on the right even compared to the left with good pressures at all locations and again there does not appear to be any significant peripheral vascular disease that would prevent healing. 02-06-2023 upon evaluation today patient appears to be doing about the same in regard to his wounds. There is quite Lawrence bit of maceration of the great toe on the right foot does appear to be healed the remainder of the wounds all appear to be Lawrence little bit macerated. I think that he may need to change this dressing Lawrence little bit more frequently. We have been using Lawrence silver alginate dressing. 02-13-2023 upon evaluation today patient unfortunately is continuing to have some issues here with purulent drainage from his toes there is also an odor that both his wife and the nurse noted although I was not able to detect this due to my lack of smell. Nonetheless I Lawrence [name] believe that this is something where we need to be Lawrence little more aggressive than the treatment of the wound to be perfectly honest. I discussed this with the patient today and he is in agreement with this plan. I am going to send in those antibiotics for him today. 02-20-2023 upon evaluation today patient appears to be doing decently well in regard to his wounds he is tolerating the antibiotics without complication and overall seems to be doing well in that regard. Fortunately I Lawrence [name] not see any signs of active infection locally nor systemically at this time. 02-27-2023 upon evaluation today patient appears to be doing well currently in regard to his wound. He has been tolerating the dressing changes without complication. Fortunately there does not appear to be any signs of infection we are using silver alginate dressings and then subsequently the Band-Aids to secure in place. He also seems  to be doing well with the hyperbarics at this point which is great news. 03-06-2023 upon evaluation today patient's wounds show signs  of improvement although this is going slowly nonetheless we are making improvement here of the same. Fortunately I Lawrence [name] not see any signs of active infection locally nor systemically which is great news. 03-13-2023 upon evaluation today patient appears to be doing well currently in regard to his wounds. He is actually showing signs of improvement he does have his tubes and so we should be good to get him started back on hyperbarics on Monday. Fortunately there does not appear to be any signs of active infection locally nor systemically which is great news. No fevers, chills, nausea, vomiting, or diarrhea. He does need refills of his medications he never got the prescription from [facility]. 03-20-2023 upon evaluation today patient appears to be doing well currently in regard to his wounds he is going require some sharp debridement today. Fortunately there does not appear to be any signs of active infection locally nor systemically which is great news. 5/24; this is Lawrence patient currently undergoing HBO for chronic osteomyelitis in the toes of his bilateral feet. Using silver alginate to wounds on the right second remanent, right third and left third toes. Our intake nurse reports weeping fluid coming about of the major wounds on the right second and left third toes. 04-02-2023 upon evaluation today patient appears to be doing well currently in regard to his wounds. He is actually showing some signs of improvement and actually very pleased with where we stand compared to what we have been previous. Fortunately I Lawrence [name] not see any signs of infection at this point. 04-10-2023 upon evaluation today patient's wounds actually are showing signs of excellent improvement today. I am actually very pleased with where we stand I think that he is making great progress and I think we are very  close to seeing this completely turned around to where we can have new skin growing over top of this already the granulation tissue is skilled in I see no bone exposure on evaluation today which is also news. 04-17-2023 upon evaluation today patient appears to be doing well currently in regard to his wounds which are actually showing signs of improvement and very pleased with where we stand. Fortunately I Lawrence [name] not see any evidence of active infection locally or systemically which is great news. No fevers, chills, nausea, vomiting, or diarrhea. 04-24-2023 upon evaluation today patient appears to be doing well currently in regard to his wounds. He is showing signs of continued and appropriate improvement. I am actually very pleased with where things stand today. I Lawrence [name] not see any signs of active infection at this time which is great news. No fevers, chills, nausea, vomiting, or diarrhea. 05-01-2023 upon evaluation today patient's wounds are showing signs of improvement. With that being said he is going require some sharp debridement today. 05-08-2023 upon evaluation patient's wound bed actually showed signs at all locations of doing much better. Fortunately there does not appear to be any evidence of active infection which is great and in general I think that he is making excellent progress towards closure. Fortunately I Lawrence [name] not see any evidence of infection locally or systemically which is great news. Of note the patient will be having Lawrence cardiac stress test coming up I believe next week. Following that he may be proceeding to catheterization. At this point we have the HBO on hold due to the cardiac issues I am not sure when or if we will get back to that to be perfectly honest. 05-15-2023 upon evaluation today patient appears to be  doing well currently in regard to his wounds we unfortunately still have the hyperbaric oxygen therapy on hold due to the fact that he is at this point still being worked up by his  cardiologist he has his first test which will be the stress test on Tuesday the 15th of this month. Thus and just Lawrence few days. Following that he is likely going to have to have Lawrence heart catheterization. All this has been have to be done and cleared before he could consider getting back into the chamber. He is aware of this and will just kind of keep things moving along in the meantime. 7/26; this is Lawrence patient we you had in hyperbarics for Wagner 4 grade 1 wound in the bilateral feet. He has completed his antibiotics unfortunately he developed what we interpreted as unstable angina. He is going for cardiac cath on August 6 and his hyperbarics is on hold. He has wounds on the remanent of his right second and his left third toe. We have been using Hydrofera Blue. Electronic Signature(s) Signed: 05/29/2023 2:26:28 PM By: Baltazar Najjar MD Lawrence [name] ([id number]) By: Baltazar Najjar MD [phone].pdf Page 4 of 9 Signed: 05/29/2023 2:26:28 PM Entered By: Baltazar Najjar on 05/29/2023 10:20:11 -------------------------------------------------------------------------------- Physical Exam Details Patient Name: Date of Service: Lawrence [name], Lawrence RLA ND W. 05/29/2023 9:15 Lawrence M Medical Record Number: [id number] Patient Account Number: [id number] Date of Birth/Sex: Treating RN: 02-Jul-1951 (72 y.o. Lawrence [name], Lawrence [name] Primary Care Provider: [name] Other Clinician: Referring Provider: Treating Provider/Extender: RO BSO N, MICHA EL Albertine Grates, Dorene Grebe in Treatment: 17 Constitutional Sitting or standing Blood Pressure is within target range for patient.. Pulse regular and within target range for patient.[name] Respirations regular, non-labored and within target range.. Temperature is normal and within the target range for the patient.[name] appears in no distress. Notes Wound exam; he has small wounds on the remanent of his right second toe and left third toe. Thick skin and callus  around the circumference of these wounds as well as fibrinous debris on the surface I gently removed all of this with Lawrence #3 curette. There was no exposed bone no purulence Electronic Signature(s) Signed: 05/29/2023 2:26:28 PM By: Baltazar Najjar MD Entered By: Baltazar Najjar on 05/29/2023 10:21:13 -------------------------------------------------------------------------------- Physician Orders Details Patient Name: Date of Service: Lawrence [name], Lawrence RLA ND W. 05/29/2023 9:15 Lawrence M Medical Record Number: [id number] Patient Account Number: [id number] Date of Birth/Sex: Treating RN: Nov 06, 1950 (72 y.o. Lawrence [name], Lawrence [name] Primary Care Provider: [name] Other Clinician: Referring Provider: Treating Provider/Extender: RO BSO N, MICHA EL Mechele Claude in Treatment: [phone] Verbal / Phone Orders: No Diagnosis Coding Follow-up Appointments ppointment in 1 week. - end of week Return Lawrence Anesthetic (Use 'Patient Medications' Section for Anesthetic Order Entry) Lidocaine applied to wound bed Edema Control - Lymphedema / Segmental Compressive Device / Other Bilateral Lower Extremities Tubigrip double layer applied - size D; size E today d/t stock availability Elevate, Exercise Daily and Lawrence void Standing for Long Periods of Time. Elevate legs to the level of the heart and pump ankles as often as possible Elevate leg(s) parallel to the floor when sitting. JARROD, HIBDON ([id number]) [id number] Page 5 of 9 Off-Loading Open toe surgical shoe - right and left foot Wound Treatment Wound #1 - T Second oe Wound Laterality: Right Cleanser: Soap and Water 1 x Per Day/30 Days Discharge Instructions: Gently cleanse wound with antibacterial soap, rinse and pat dry prior to dressing wounds  Prim Dressing: Hydrofera Blue Ready Transfer Foam, 2.5x2.5 (in/in) 1 x Per Day/30 Days ary Discharge Instructions: Apply Hydrofera Blue Ready to wound bed as directed Secondary Dressing:  Coverlet Latex-Free Fabric Adhesive Dressings 1 x Per Day/30 Days Discharge Instructions: 1.5 x 2 Wound #4 - T Third oe Wound Laterality: Left Cleanser: Soap and Water 1 x Per Day/30 Days Discharge Instructions: Gently cleanse wound with antibacterial soap, rinse and pat dry prior to dressing wounds Prim Dressing: Hydrofera Blue Ready Transfer Foam, 2.5x2.5 (in/in) 1 x Per Day/30 Days ary Discharge Instructions: Apply Hydrofera Blue Ready to wound bed as directed Secondary Dressing: Coverlet Latex-Free Fabric Adhesive Dressings 1 x Per Day/30 Days Discharge Instructions: 1.5 x 2 Electronic Signature(s) Signed: 05/29/2023 1:14:15 PM By: Bonnell Public Signed: 05/29/2023 2:26:28 PM By: Baltazar Najjar MD Entered By: Bonnell Public on 05/29/2023 09:55:43 -------------------------------------------------------------------------------- Problem List Details Patient Name: Date of Service: Lawrence [name], Lawrence RLA ND W. 05/29/2023 9:15 Lawrence M Medical Record Number: [id number] Patient Account Number: [id number] Date of Birth/Sex: Treating RN: [birth year] (72 y.o. Lawrence [name], Lawrence [name] Primary Care Provider: [name] Other Clinician: Referring Provider: Treating Provider/Extender: Chauncey Mann, MICHA EL Mechele Claude in Treatment: 17 Active Problems ICD-10 Encounter Code Description Active Date MDM Diagnosis M86.371 Chronic multifocal osteomyelitis, right ankle and foot 02/13/2023 No Yes E10.621 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 01/30/2023 No Yes L97.522 Non-pressure chronic ulcer of other part of left foot with fat layer exposed 01/30/2023 No Yes L97.512 Non-pressure chronic ulcer of other part of right foot with fat layer exposed 01/30/2023 No Yes XAZIER, KOCHAN ([id number]) [id number] Page 6 of 9 I25.10 Atherosclerotic heart disease of native coronary artery without angina pectoris 01/30/2023 No Yes I10 Essential (primary) hypertension 01/30/2023 No Yes I73.89  Other specified peripheral vascular diseases 01/30/2023 No Yes N18.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, stage 3a 01/30/2023 No Yes Inactive Problems Resolved Problems Electronic Signature(s) Signed: 05/29/2023 2:26:28 PM By: Baltazar Najjar MD Entered By: Baltazar Najjar on 05/29/2023 10:18:38 -------------------------------------------------------------------------------- Progress Note Details Patient Name: Date of Service: Lawrence [name], Lawrence RLA ND W. 05/29/2023 9:15 Lawrence M Medical Record Number: [id number] Patient Account Number: [id number] Date of Birth/Sex: Treating RN: [birth year] (72 y.o. Lawrence [name], Lawrence [name] Primary Care Provider: [name] Other Clinician: Referring Provider: Treating Provider/Extender: RO BSO N, MICHA EL Albertine Grates, Dorene Grebe in Treatment: 17 Subjective History of Present Illness (HPI) 01-30-2023 upon evaluation today patient appears to be doing poorly currently in regard to his feet bilaterally. He is actually referral from Dr. Gala Lewandowsky who is Lawrence local podiatrist. Subsequently Dr. Logan Bores has been seeing him but wanted to refer him to [place] due to the fact that he is having difficulty healing in regard to his feet bilaterally. He has had several amputations. 1 back in the summer 2023 of the great toe right foot. Subsequently had Lawrence partial digit amputation of the second toe that was performed in November 2023. Subsequently since that time he had Lawrence hard time getting this to heal. Fortunately there does not appear to be any signs of infection has been using gentamicin currently as the treatment of choice. With that being said I feel like that we need to try to see about drying some of these wounds off of it which is my biggest concern at this point. Also think that he could potentially have osteomyelitis of this right foot he has not had an x-ray of the left foot Emina suggest MRI right foot and x-ray left foot  he is already had an x-ray of the right foot at Dr. Logan Bores office and that did  not reveal obvious signs of osteomyelitis but he does have bone exposure in the distal portion of the second toe right foot digit. Patient does have Lawrence history significant for diabetes mellitus type 1, hypertension, peripheral vascular disease, chronic kidney disease stage IIIa. He also has undergone arterial study which was actually performed on 12-08-2021 and this shows that he actually has good arterial flow into the right lower extremity and left lower extremity for that matter. He had noncompressible ABIs but on the left he had Lawrence TBI of 0.68 and in regard to his flow it was actually better on the right even compared to the left with good pressures at all locations and again there does not appear to be any significant peripheral vascular disease that would prevent healing. 02-06-2023 upon evaluation today patient appears to be doing about the same in regard to his wounds. There is quite Lawrence bit of maceration of the great toe on the right foot does appear to be healed the remainder of the wounds all appear to be Lawrence little bit macerated. I think that he may need to change this dressing Lawrence little bit more frequently. We have been using Lawrence silver alginate dressing. 02-13-2023 upon evaluation today patient unfortunately is continuing to have some issues here with purulent drainage from his toes there is also an odor that both his wife and the nurse noted although I was not able to detect this due to my lack of smell. Nonetheless I Lawrence [name] believe that this is something where we need to be Lawrence little more aggressive than the treatment of the wound to be perfectly honest. I discussed this with the patient today and he is in agreement with this plan. I am going to send in those antibiotics for him today. 02-20-2023 upon evaluation today patient appears to be doing decently well in regard to his wounds he is tolerating the antibiotics without complication and overall seems to be doing well in that regard. Fortunately I Lawrence [name] not  see any signs of active infection locally nor systemically at this time. 02-27-2023 upon evaluation today patient appears to be doing well currently in regard to his wound. He has been tolerating the dressing changes without complication. Fortunately there does not appear to be any signs of infection we are using silver alginate dressings and then subsequently the Band-Aids to Lawrence [name], Lawrence [name] ([id number]) [id number] Page 7 of 9 secure in place. He also seems to be doing well with the hyperbarics at this point which is great news. 03-06-2023 upon evaluation today patient's wounds show signs of improvement although this is going slowly nonetheless we are making improvement here of the same. Fortunately I Lawrence [name] not see any signs of active infection locally nor systemically which is great news. 03-13-2023 upon evaluation today patient appears to be doing well currently in regard to his wounds. He is actually showing signs of improvement he does have his tubes and so we should be good to get him started back on hyperbarics on Monday. Fortunately there does not appear to be any signs of active infection locally nor systemically which is great news. No fevers, chills, nausea, vomiting, or diarrhea. He does need refills of his medications he never got the prescription from [facility]. 03-20-2023 upon evaluation today patient appears to be doing well currently in regard to his wounds he is going require some sharp debridement today. Fortunately there  does not appear to be any signs of active infection locally nor systemically which is great news. 5/24; this is Lawrence patient currently undergoing HBO for chronic osteomyelitis in the toes of his bilateral feet. Using silver alginate to wounds on the right second remanent, right third and left third toes. Our intake nurse reports weeping fluid coming about of the major wounds on the right second and left third toes. 04-02-2023 upon evaluation  today patient appears to be doing well currently in regard to his wounds. He is actually showing some signs of improvement and actually very pleased with where we stand compared to what we have been previous. Fortunately I Lawrence [name] not see any signs of infection at this point. 04-10-2023 upon evaluation today patient's wounds actually are showing signs of excellent improvement today. I am actually very pleased with where we stand I think that he is making great progress and I think we are very close to seeing this completely turned around to where we can have new skin growing over top of this already the granulation tissue is skilled in I see no bone exposure on evaluation today which is also news. 04-17-2023 upon evaluation today patient appears to be doing well currently in regard to his wounds which are actually showing signs of improvement and very pleased with where we stand. Fortunately I Lawrence [name] not see any evidence of active infection locally or systemically which is great news. No fevers, chills, nausea, vomiting, or diarrhea. 04-24-2023 upon evaluation today patient appears to be doing well currently in regard to his wounds. He is showing signs of continued and appropriate improvement. I am actually very pleased with where things stand today. I Lawrence [name] not see any signs of active infection at this time which is great news. No fevers, chills, nausea, vomiting, or diarrhea. 05-01-2023 upon evaluation today patient's wounds are showing signs of improvement. With that being said he is going require some sharp debridement today. 05-08-2023 upon evaluation patient's wound bed actually showed signs at all locations of doing much better. Fortunately there does not appear to be any evidence of active infection which is great and in general I think that he is making excellent progress towards closure. Fortunately I Lawrence [name] not see any evidence of infection locally or systemically which is great news. Of note the patient will be  having Lawrence cardiac stress test coming up I believe next week. Following that he may be proceeding to catheterization. At this point we have the HBO on hold due to the cardiac issues I am not sure when or if we will get back to that to be perfectly honest. 05-15-2023 upon evaluation today patient appears to be doing well currently in regard to his wounds we unfortunately still have the hyperbaric oxygen therapy on hold due to the fact that he is at this point still being worked up by his cardiologist he has his first test which will be the stress test on Tuesday the 15th of this month. Thus and just Lawrence few days. Following that he is likely going to have to have Lawrence heart catheterization. All this has been have to be done and cleared before he could consider getting back into the chamber. He is aware of this and will just kind of keep things moving along in the meantime. 7/26; this is Lawrence patient we you had in hyperbarics for Wagner 4 grade 1 wound in the bilateral feet. He has completed his antibiotics unfortunately he developed what we interpreted as unstable angina.  He is going for cardiac cath on August 6 and his hyperbarics is on hold. He has wounds on the remanent of his right second and his left third toe. We have been using Hydrofera Blue. Objective Constitutional Sitting or standing Blood Pressure is within target range for patient.. Pulse regular and within target range for patient.[name] Respirations regular, non-labored and within target range.. Temperature is normal and within the target range for the patient.[name] appears in no distress. Vitals Time Taken: 9:20 AM, Height: 70 in, Weight: 302 lbs, BMI: 43.3, Temperature: 97.6 F, Pulse: 70 bpm, Respiratory Rate: 18 breaths/min, Blood Pressure: 129/70 mmHg. General Notes: Wound exam; he has small wounds on the remanent of his right second toe and left third toe. Thick skin and callus around the circumference of these wounds as well as fibrinous debris on  the surface I gently removed all of this with Lawrence #3 curette. There was no exposed bone no purulence Integumentary (Hair, Skin) Wound #1 status is Open. Original cause of wound was Gradually Appeared. The date acquired was: 11/03/2022. The wound has been in treatment 17 weeks. The wound is located on the Right T Second. The wound measures 0.7cm length x 0.8cm width x 0.2cm depth; 0.44cm^2 area and 0.088cm^3 volume. There is oe Fat Layer (Subcutaneous Tissue) exposed. There is Lawrence small amount of serosanguineous drainage noted. The wound margin is distinct with the outline attached to the wound base. There is large (67-100%) pink granulation within the wound bed. There is Lawrence small (1-33%) amount of necrotic tissue within the wound bed. Wound #4 status is Open. Original cause of wound was Gradually Appeared. The date acquired was: 12/05/2022. The wound has been in treatment 17 weeks. The wound is located on the Left T Third. The wound measures 0.4cm length x 0.4cm width x 0.1cm depth; 0.126cm^2 area and 0.013cm^3 volume. There is Fat oe Layer (Subcutaneous Tissue) exposed. There is Lawrence medium amount of serosanguineous drainage noted. The wound margin is distinct with the outline attached to the wound base. There is no granulation within the wound bed. There is Lawrence large (67-100%) amount of necrotic tissue within the wound bed including Adherent Slough. 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 There was Lawrence Selective/Open Wound Skin/Dermis Debridement with Lawrence total area of 0.44 sq cm performed by Maxwell Caul, MD. With the following instrument(s): Curette to remove Viable and Non-Viable tissue/material. Material removed includes Callus and Skin: Dermis and after achieving pain control using Lidocaine 2% T opical Gel. No specimens were taken. Lawrence time out was conducted at 09:52, prior to the start of the procedure. Lawrence Minimum amount of bleeding was controlled with N/Lawrence. The procedure was tolerated well with Lawrence pain level of 0 throughout and Lawrence pain level of 0 following the procedure. Post Debridement Measurements: 0.7cm length x 0.8cm width x 0.2cm depth; 0.088cm^3 volume. Character of Wound/Ulcer Post Debridement is improved. Severity  of Tissue Post Debridement is: Fat layer exposed. Post procedure Diagnosis Wound #1: Same as Pre-Procedure Wound #4 Pre-procedure diagnosis of Wound #4 is Lawrence Diabetic Wound/Ulcer of the Lower Extremity located on the Left T Third .Severity of Tissue Pre Debridement is: oe Fat layer exposed. There was Lawrence Selective/Open Wound Skin/Dermis Debridement with Lawrence total area of 0.13 sq cm performed by Maxwell Caul, MD. With the following instrument(s): Curette to remove Viable and Non-Viable tissue/material. Material removed includes Callus and Skin: Dermis and after achieving pain control using Lidocaine 2% T opical Gel. No specimens were taken. Lawrence time out was conducted at 09:52, prior to the start of the procedure. Lawrence Minimum amount of bleeding was controlled with N/Lawrence. The procedure was tolerated well with Lawrence pain level of 0 throughout and Lawrence pain level of 0 following the procedure. Post Debridement Measurements: 0.4cm length x 0.4cm width x 0.1cm depth; 0.013cm^3  volume. Character of Wound/Ulcer Post Debridement is improved. Severity of Tissue Post Debridement is: Fat layer exposed. Post procedure Diagnosis Wound #4: Same as Pre-Procedure Plan Follow-up Appointments: Return Appointment in 1 week. - end of week Anesthetic (Use 'Patient Medications' Section for Anesthetic Order Entry): Lidocaine applied to wound bed Edema Control - Lymphedema / Segmental Compressive Device / Other: Tubigrip double layer applied - size D; size E today d/t stock availability Elevate, Exercise Daily and Avoid Standing for Long Periods of Time. Elevate legs to the level of the heart and pump ankles as often as possible Elevate leg(s) parallel to the floor when sitting. Off-Loading: Open toe surgical shoe - right and left foot WOUND #1: - T Second Wound Laterality: Right oe Cleanser: Soap and Water 1 x Per Day/30 Days Discharge Instructions: Gently cleanse wound with antibacterial soap, rinse and pat dry prior to dressing wounds Prim Dressing: Hydrofera Blue Ready Transfer Foam, 2.5x2.5 (in/in) 1 x Per Day/30 Days ary Discharge Instructions: Apply Hydrofera Blue Ready to wound bed as directed Secondary Dressing: Coverlet Latex-Free Fabric Adhesive Dressings 1 x Per Day/30 Days Discharge Instructions: 1.5 x 2 WOUND #4: - T Third Wound Laterality: Left oe Cleanser: Soap and Water 1 x Per Day/30 Days Discharge Instructions: Gently cleanse wound with antibacterial soap, rinse and pat dry prior to dressing wounds Prim Dressing: Hydrofera Blue Ready Transfer Foam, 2.5x2.5 (in/in) 1 x Per Day/30 Days ary Discharge Instructions: Apply Hydrofera Blue Ready to wound bed as directed Secondary Dressing: Coverlet Latex-Free Fabric Adhesive Dressings 1 x Per Day/30 Days Discharge Instructions: 1.5 x 2 1. His wounds clean up quite nicely with Lawrence debridement. We are going to continue with Hydrofera Blue to both wound areas. 2. His coronary angiography is on August 6. If he is  cleared after that he may be able to resume hyperbarics 3. I see no evidence of active infection Electronic Signature(s) Signed: 05/29/2023 2:26:28 PM By: Baltazar Najjar MD Entered By: Baltazar Najjar on 05/29/2023 10:22:36 Lawrence [name], Lawrence [name] ([id number]) [id number] Page 9 of 9 -------------------------------------------------------------------------------- SuperBill Details Patient Name: Date of Service: Lawrence [name], Lawrence RLA ND W. 05/29/2023 Medical Record Number: [id number] Patient Account Number: [id number] Date of Birth/Sex: Treating RN: 1950/12/21 (72 y.o. Lawrence [name], Lawrence [name] Primary Care Provider: [name] Other Clinician: Referring Provider: Treating Provider/Extender: Chauncey Mann, MICHA EL Mechele Claude in Treatment: 17 Diagnosis Coding ICD-10 Codes Code Description M86.371 Chronic multifocal osteomyelitis, right ankle and foot E10.621 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 L97.522 Non-pressure chronic ulcer of other part of left foot with fat  layer exposed L97.512 Non-pressure chronic ulcer of other part of right foot with fat layer exposed I25.10 Atherosclerotic heart disease of native coronary artery without angina pectoris I10 Essential (primary) hypertension I73.89 Other specified peripheral vascular diseases N18.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, stage 3a Facility Procedures : CPT4 Code: [id number] Description: [phone] - DEBRIDE WOUND 1ST 20 SQ CM OR < ICD-10 Diagnosis Description 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 Modifier: Quantity: 1 Physician Procedures : CPT4 Code Description Modifier [id number] - WC PHYS DEBR WO ANESTH 20 SQ CM ICD-10 Diagnosis Description L97.522 Non-pressure chronic ulcer of other part of left foot with fat layer exposed L97.512 Non-pressure chronic ulcer of other part of right  foot  with fat layer exposed Quantity: 1 Electronic Signature(s) Signed: 05/29/2023 2:26:28 PM By: Baltazar Najjar MD Entered By: Baltazar Najjar on 05/29/2023 10:22:47

## 2023-06-05 ENCOUNTER — Encounter: Payer: Medicare Other | Attending: Physician Assistant | Admitting: Physician Assistant

## 2023-06-05 DIAGNOSIS — M86371 Chronic multifocal osteomyelitis, right ankle and foot: Secondary | ICD-10-CM | POA: Diagnosis not present

## 2023-06-05 DIAGNOSIS — I25110 Atherosclerotic heart disease of native coronary artery with unstable angina pectoris: Secondary | ICD-10-CM | POA: Diagnosis not present

## 2023-06-05 DIAGNOSIS — I120 Hypertensive chronic kidney disease with stage 5 chronic kidney disease or end stage renal disease: Secondary | ICD-10-CM | POA: Diagnosis not present

## 2023-06-05 DIAGNOSIS — N18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, stage 3a: Secondary | ICD-10-CM | POA: Insufficient documentation

## 2023-06-05 DIAGNOSIS — E10621 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: Secondary | ICD-10-CM | POA: Insufficient documentation

## 2023-06-05 DIAGNOSIS — E1022 Type 1 diabetes mellitus with diabetic chronic kidney disease: Secondary | ICD-10-CM | POA: Insufficient documentation

## 2023-06-05 DIAGNOSIS — L97516 Non-pressure chronic ulcer of other part of right foot with bone involvement without evidence of necrosis: Secondary | ICD-10-CM | POA: Diagnosis not present

## 2023-06-05 DIAGNOSIS — E1051 Type 1 diabetes mellitus with diabetic peripheral angiopathy without gangrene: Secondary | ICD-10-CM | POA: Diagnosis not present

## 2023-06-05 DIAGNOSIS — E1069 Type 1 diabetes mellitus with other specified complication: Secondary | ICD-10-CM | POA: Insufficient documentation

## 2023-06-05 DIAGNOSIS — L97522 Non-pressure chronic ulcer of other part of left foot with fat layer exposed: Secondary | ICD-10-CM | POA: Insufficient documentation

## 2023-06-05 NOTE — Progress Notes (Signed)
Lawrence [name] ([id number]) [id number] Page 1 of 4 Visit Report for 06/05/2023 Arrival Information Details Patient Name: Date of Service: [name], [name] 06/05/2023 9:45 A M Medical Record Number: [id number] Patient Account Number: [id number] Date of Birth/Sex: Treating RN: May 25, 1951 (72 y.o. Lawrence [name]) Lawrence [name] Primary Care : [name] Other Clinician: Referring : Treating /Extender: [name] in Treatment: 18 Visit Information History Since Last Visit Added or deleted any medications: No Patient Arrived: Ambulatory Any new allergies or adverse reactions: No Arrival Time: 09:49 Had a fall or experienced change in No Accompanied By: wife activities of daily living that may affect Transfer Assistance: None risk of falls: Patient Identification Verified: Yes Signs or symptoms of abuse/neglect since last visito No Secondary Verification Process Completed: Yes Hospitalized since last visit: No Patient Requires Transmission-Based Precautions: No Implantable device outside of the clinic excluding No Patient Has Alerts: No cellular tissue based products placed in the center since last visit: Has Dressing in Place as Prescribed: Yes Pain Present Now: No Electronic Signature(s) Unsigned Entered ByYevonne [name] on 06/05/2023 09:49:29 -------------------------------------------------------------------------------- Pain Assessment Details Patient Name: Date of Service: [name], [name] 06/05/2023 9:45 A M Medical Record Number: [id number] Patient Account Number: [id number] Date of Birth/Sex: Treating RN: 11-Nov-1950 (72 y.o. Lawrence [name] Primary Care : [name] Other Clinician: Referring : Treating /Extender: [name] in Treatment: 18 Active Problems Location of Pain Severity and Description of Pain Patient Has Paino No Site  Locations Lawrence, [name] ([id number]) [id number] Page 2 of 4 Pain Management and Medication Current Pain Management: Electronic Signature(s) Unsigned Entered ByYevonne [name] on 06/05/2023 09:50:56 -------------------------------------------------------------------------------- Wound Assessment Details Patient Name: Date of Service: [name], [name] 06/05/2023 9:45 A M Medical Record Number: [id number] Patient Account Number: [id number] Date of Birth/Sex: Treating RN: Mar 05, 1951 (72 y.o. Lawrence [name]) Lawrence [name] Primary Care : [name] Other Clinician: Referring : Treating /Extender: [name] in Treatment: 18 Wound Status Wound Number: 1 Primary Etiology: Diabetic Wound/Ulcer of the Lower Extremity Wound Location: Right T Second oe Wound Status: Open Wounding Event: Gradually Appeared Date Acquired: 11/03/2022 Weeks Of Treatment: 18 Clustered Wound: No Pending Amputation On Presentation Wound Measurements Length: (cm) 0.9 Width: (cm) 0.7 Depth: (cm) 0.2 Area: (cm) 0.495 Volume: (cm) 0.099 % Reduction in Area: 36.9% % Reduction in Volume: 58.1% Wound Description Classification: Grade 3 Exudate Amount: Small Exudate Type: Serosanguineous Exudate Color: red, brown Electronic Signature(s) Lawrence [name] ([id number]) [id number] Page 3 of 4 Unsigned Entered By: Lawrence [name] on 06/05/2023 09:56:00 -------------------------------------------------------------------------------- Wound Assessment Details Patient Name: Date of Service: [name], [name] 06/05/2023 9:45 A M Medical Record Number: [id number] Patient Account Number: [id number] Date of Birth/Sex: Treating RN: Nov 05, 1950 (72 y.o. Lawrence [name]) Lawrence [name] Primary Care : [name] Other Clinician: Referring : Treating /Extender: [name] in Treatment: 18 Wound  Status Wound Number: 4 Primary Etiology: Diabetic Wound/Ulcer of the Lower Extremity Wound Location: Left T Third oe Wound Status: Open Wounding Event: Gradually Appeared Date Acquired: 12/05/2022 Weeks Of Treatment: 18 Clustered Wound: No Pending Amputation On Presentation Wound Measurements Length: (cm) 0.2 Width: (cm) 0.2 Depth: (cm) 0.1 Area: (cm) 0.031 Volume: (cm) 0.003 % Reduction in Area: 98.4% % Reduction in Volume: 99.2% Wound Description Classification: Grade 3 Exudate Amount: Medium Exudate Type: Serosanguineous Exudate Color: red, brown Electronic Signature(s) Unsigned Entered By: Lawrence [name] on  06/05/2023 09:56:00 -------------------------------------------------------------------------------- Vitals Details Patient Name: Date of Service: [name], [name] 06/05/2023 9:45 A M Medical Record Number: [id number] Patient Account Number: [id number] Date of Birth/Sex: Treating RN: [birth year] (72 y.o. Lawrence [name]) Juris, Gosnell ([id number]) [id number] Page 4 of 4 Primary Care : [name] Other Clinician: Referring : Treating /Extender: [name] in Treatment: 18 Vital Signs Time Taken: 09:50 Temperature (F): 97.9 Height (in): 70 Pulse (bpm): 64 Weight (lbs): 302 Respiratory Rate (breaths/min): 18 Body Mass Index (BMI): 43.3 Blood Pressure (mmHg): 122/55 Reference Range: 80 - 120 mg / dl Electronic Signature(s) Unsigned Entered ByYevonne [name] on 06/05/2023 09:50:30 Signature(s): Date(s):

## 2023-06-05 NOTE — Progress Notes (Signed)
LARICO, DIMOCK ([id number]) [id number] Page 1 of 2 Visit Report for 06/05/2023 Chief Complaint Document Details Patient Name: Date of Service: [name], [name] 06/05/2023 9:45 A M Medical Record Number: [id number] Patient Account Number: [id number] Date of Birth/Sex: Treating RN: [birth year] (72 y.o. [name]) [name] Primary Care Provider: [name] Other Clinician: Referring Provider: Treating Provider/Extender: [name] in Treatment: 18 Information Obtained from: Patient Chief Complaint Bilateral foot ulcers Electronic Signature(s) Signed: 06/05/2023 9:49:06 AM By: [name] Entered By: [name] on 06/05/2023 09:49:06 -------------------------------------------------------------------------------- Problem List Details Patient Name: Date of Service: [name], [name] 06/05/2023 9:45 A M Medical Record Number: [id number] Patient Account Number: [id number] Date of Birth/Sex: Treating RN: Jun 07, 1951 (72 y.o. [name] Primary Care Provider: [name] Other Clinician: Referring Provider: Treating Provider/Extender: [name] in Treatment: 18 Active Problems ICD-10 Encounter Code Description Active Date MDM Diagnosis M86.371 Chronic multifocal osteomyelitis, right ankle and foot 02/13/2023 No Yes E10.621 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 01/30/2023 No Yes L97.522 Non-pressure chronic ulcer of other part of left foot with fat 01/30/2023 No Yes layer exposed [name] ([id number]) [id number] Page 2 of 2 [phone] Non-pressure chronic ulcer of other part of right foot with fat 01/30/2023 No Yes layer exposed I25.10 Atherosclerotic heart disease of native coronary artery without 01/30/2023 No Yes angina pectoris I10 Essential (primary) hypertension 01/30/2023 No Yes I73.89 Other specified peripheral vascular diseases 01/30/2023 No Yes N18.31  Chronic kidney disease, stage 3a 01/30/2023 No Yes Inactive Problems Resolved Problems Electronic Signature(s) Signed: 06/05/2023 9:48:58 AM By: [name] Entered By: [name] on 06/05/2023 09:48:58

## 2023-06-09 ENCOUNTER — Encounter: Payer: Self-pay | Admitting: Cardiology

## 2023-06-09 ENCOUNTER — Other Ambulatory Visit: Payer: Self-pay

## 2023-06-09 ENCOUNTER — Encounter
Admission: AD | Disposition: A | Discharge status: Home / Self Care | Payer: Self-pay | Source: Home / Self Care | Attending: Cardiology

## 2023-06-09 ENCOUNTER — Ambulatory Visit
Admission: AD | Admit: 2023-06-09 | Discharge: 2023-06-09 | Disposition: A | Discharge status: Home / Self Care | Payer: Medicare Other | Attending: Cardiology | Admitting: Cardiology

## 2023-06-09 DIAGNOSIS — Z951 Presence of aortocoronary bypass graft: Secondary | ICD-10-CM | POA: Diagnosis not present

## 2023-06-09 DIAGNOSIS — R9430 Abnormal result of cardiovascular function study, unspecified: Secondary | ICD-10-CM | POA: Diagnosis present

## 2023-06-09 DIAGNOSIS — I2510 Atherosclerotic heart disease of native coronary artery without angina pectoris: Secondary | ICD-10-CM | POA: Diagnosis not present

## 2023-06-09 DIAGNOSIS — I2582 Chronic total occlusion of coronary artery: Secondary | ICD-10-CM | POA: Insufficient documentation

## 2023-06-09 HISTORY — PX: LEFT HEART CATH AND CORS/GRAFTS ANGIOGRAPHY: CATH118250

## 2023-06-09 SURGERY — LEFT HEART CATH AND CORS/GRAFTS ANGIOGRAPHY
Anesthesia: Moderate Sedation | Laterality: Left

## 2023-06-09 MED ORDER — HEPARIN (PORCINE) IN NACL 2000-0.9 UNIT/L-% IV SOLN
INTRAVENOUS | Status: DC | PRN
  Administered 2023-06-09: 1000 mL

## 2023-06-09 MED ORDER — SODIUM CHLORIDE 0.9 % IV SOLN: 250.0000 mL | INTRAVENOUS | Status: DC | PRN

## 2023-06-09 MED ORDER — SODIUM CHLORIDE 0.9 % WEIGHT BASED INFUSION
1.0000 mL/kg/h | INTRAVENOUS | Status: DC
  Administered 2023-06-09: 1 mL/kg/h via INTRAVENOUS

## 2023-06-09 MED ORDER — SODIUM CHLORIDE 0.9 % IV SOLN: INTRAVENOUS | Status: DC

## 2023-06-09 MED ORDER — LIDOCAINE HCL (PF) 1 % IJ SOLN
INTRAMUSCULAR | Status: DC | PRN
  Administered 2023-06-09: 20 mL

## 2023-06-09 MED ORDER — HYDRALAZINE HCL 20 MG/ML IJ SOLN: 10.0000 mg | INTRAMUSCULAR | Status: DC | PRN

## 2023-06-09 MED ORDER — FENTANYL CITRATE (PF) 100 MCG/2ML IJ SOLN
INTRAMUSCULAR | Status: DC | PRN
  Administered 2023-06-09: 25 ug via INTRAVENOUS

## 2023-06-09 MED ORDER — ONDANSETRON HCL 4 MG/2ML IJ SOLN: 4.0000 mg | Freq: Four times a day (QID) | INTRAMUSCULAR | Status: DC | PRN

## 2023-06-09 MED ORDER — ASPIRIN 81 MG PO CHEW: 81.0000 mg | CHEWABLE_TABLET | ORAL | Status: DC

## 2023-06-09 MED ORDER — ACETAMINOPHEN 325 MG PO TABS: 650.0000 mg | ORAL_TABLET | ORAL | Status: DC | PRN

## 2023-06-09 MED ORDER — SODIUM CHLORIDE 0.9 % WEIGHT BASED INFUSION: 3.0000 mL/kg/h | INTRAVENOUS | Status: AC

## 2023-06-09 MED ORDER — FENTANYL CITRATE (PF) 100 MCG/2ML IJ SOLN
INTRAMUSCULAR | Status: AC
  Filled 2023-06-09: qty 2

## 2023-06-09 MED ORDER — MIDAZOLAM HCL 2 MG/2ML IJ SOLN
INTRAMUSCULAR | Status: DC | PRN
  Administered 2023-06-09 (×2): .5 mg via INTRAVENOUS

## 2023-06-09 MED ORDER — MIDAZOLAM HCL 2 MG/2ML IJ SOLN
INTRAMUSCULAR | Status: AC
  Filled 2023-06-09: qty 2

## 2023-06-09 MED ORDER — IOHEXOL 300 MG/ML  SOLN
INTRAMUSCULAR | Status: DC | PRN
  Administered 2023-06-09: 140 mL

## 2023-06-09 MED ORDER — SODIUM CHLORIDE 0.9% FLUSH: 3.0000 mL | INTRAVENOUS | Status: DC | PRN

## 2023-06-09 MED ORDER — LABETALOL HCL 5 MG/ML IV SOLN: 10.0000 mg | INTRAVENOUS | Status: DC | PRN

## 2023-06-09 MED ORDER — LIDOCAINE HCL 1 % IJ SOLN
INTRAMUSCULAR | Status: AC
  Filled 2023-06-09: qty 20

## 2023-06-09 MED ORDER — SODIUM CHLORIDE 0.9% FLUSH: 3.0000 mL | Freq: Two times a day (BID) | INTRAVENOUS | Status: DC

## 2023-06-09 MED ORDER — HEPARIN (PORCINE) IN NACL 1000-0.9 UT/500ML-% IV SOLN
INTRAVENOUS | Status: AC
  Filled 2023-06-09: qty 1000

## 2023-06-09 SURGICAL SUPPLY — 14 items
CATH INFINITI 5 FR IM (CATHETERS) IMPLANT
CATH INFINITI 5FR MULTPACK ANG (CATHETERS) IMPLANT
DEVICE CLOSURE MYNXGRIP 5F (Vascular Products) IMPLANT
DRAPE BRACHIAL (DRAPES) IMPLANT
KIT SYRINGE INJ CVI SPIKEX1 (MISCELLANEOUS) IMPLANT
NDL PERC 18GX7CM (NEEDLE) IMPLANT
NEEDLE PERC 18GX7CM (NEEDLE) ×1 IMPLANT
PACK CARDIAC CATH (CUSTOM PROCEDURE TRAY) ×1 IMPLANT
PROTECTION STATION PRESSURIZED (MISCELLANEOUS) ×1
SET ATX-X65L (MISCELLANEOUS) IMPLANT
SHEATH AVANTI 5FR X 11CM (SHEATH) IMPLANT
STATION PROTECTION PRESSURIZED (MISCELLANEOUS) IMPLANT
WIRE EMERALD 3MM-J .035X260CM (WIRE) IMPLANT
WIRE GUIDERIGHT .035X150 (WIRE) IMPLANT

## 2023-06-12 ENCOUNTER — Encounter: Payer: Medicare Other | Admitting: Physician Assistant

## 2023-06-12 DIAGNOSIS — E1069 Type 1 diabetes mellitus with other specified complication: Secondary | ICD-10-CM | POA: Diagnosis not present

## 2023-06-12 NOTE — Progress Notes (Signed)
LARON, BURGE ([id number]) [id number] Page 1 of 2 Visit Report for 06/12/2023 Chief Complaint Document Details Patient Name: Date of Service: [name], [name] 06/12/2023 9:45 A M Medical Record Number: [id number] Patient Account Number: [id number] Date of Birth/Sex: Treating RN: 30-Jun-1951 (72 y.o. [name]) [name] Primary Care Provider: [name] Other Clinician: Referring Provider: Treating Provider/Extender: [name] in Treatment: 19 Information Obtained from: Patient Chief Complaint Bilateral foot ulcers Electronic Signature(s) Signed: 06/12/2023 9:46:27 AM By: [name] Entered By: [name] on 06/12/2023 09:46:27 -------------------------------------------------------------------------------- Problem List Details Patient Name: Date of Service: [name], [name] 06/12/2023 9:45 A M Medical Record Number: [id number] Patient Account Number: [id number] Date of Birth/Sex: Treating RN: 04/08/1951 (72 y.o. [name] Primary Care Provider: [name] Other Clinician: Referring Provider: Treating Provider/Extender: [name] in Treatment: 19 Active Problems ICD-10 Encounter Code Description Active Date MDM Diagnosis M86.371 Chronic multifocal osteomyelitis, right ankle and foot 02/13/2023 No Yes E10.621 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 01/30/2023 No Yes L97.522 Non-pressure chronic ulcer of other part of left foot with fat 01/30/2023 No Yes layer exposed [name] ([id number]) [id number] Page 2 of 2 [phone] Non-pressure chronic ulcer of other part of right foot with fat 01/30/2023 No Yes layer exposed I25.10 Atherosclerotic heart disease of native coronary artery without 01/30/2023 No Yes angina pectoris I10 Essential (primary) hypertension 01/30/2023 No Yes I73.89 Other specified peripheral vascular diseases 01/30/2023 No Yes N18.31  Chronic kidney disease, stage 3a 01/30/2023 No Yes Inactive Problems Resolved Problems Electronic Signature(s) Signed: 06/12/2023 9:46:23 AM By: [name] Entered By: [name] on 06/12/2023 09:46:23

## 2023-06-12 NOTE — Progress Notes (Signed)
Lawrence [name] ([id number]) [id number] Page 1 of 10 Visit Report for 06/12/2023 Arrival Information Details Patient Name: Date of Service: [name], [name] 06/12/2023 9:45 A M Medical Record Number: [id number] Patient Account Number: [id number] Date of Birth/Sex: Treating RN: 1950-11-30 (72 y.o. Lawrence [name]) Lawrence [name] Primary Care : Lawrence [name] Other Clinician: Referring : Treating /Extender: Lawrence [name] in Treatment: 19 Visit Information History Since Last Visit Added or deleted any medications: No Patient Arrived: Ambulatory Any new allergies or adverse reactions: No Arrival Time: 09:55 Had a fall or experienced change in No Accompanied By: wife activities of daily living that may affect Transfer Assistance: None risk of falls: Patient Identification Verified: Yes Signs or symptoms of abuse/neglect since last visito No Secondary Verification Process Completed: Yes Hospitalized since last visit: No Patient Requires Transmission-Based Precautions: No Implantable device outside of the clinic excluding No Patient Has Alerts: No cellular tissue based products placed in the center since last visit: Has Compression in Place as Prescribed: Yes Pain Present Now: No Electronic Signature(s) Signed: 06/12/2023 10:11:42 AM By: Lawrence Pax RN Entered By: Lawrence [name] on 06/12/2023 10:11:41 -------------------------------------------------------------------------------- Clinic Level of Care Assessment Details Patient Name: Date of Service: [name], [name] 06/12/2023 9:45 A M Medical Record Number: [id number] Patient Account Number: [id number] Date of Birth/Sex: Treating RN: 1951-03-25 (72 y.o. Lawrence [name]) Lawrence [name] Primary Care : Lawrence [name] Other Clinician: Referring : Treating /Extender: Lawrence [name] in Treatment: 19 Clinic Level of Care Assessment Items TOOL 4  Quantity Score X- 1 0 Use when only an EandM is performed on FOLLOW-UP visit ASSESSMENTS - Nursing Assessment / Reassessment X- 1 10 Reassessment of Co-morbidities (includes updates in patient status) X- 1 5 Reassessment of Adherence to Treatment Plan CHUKWUKA, DWORAK ([id number]) [id number] Page 2 of 10 ASSESSMENTS - Wound and Skin A ssessment / Reassessment []  - Simple Wound Assessment / Reassessment - one wound 0 X- 2 5 Complex Wound Assessment / Reassessment - multiple wounds []  - 0 Dermatologic / Skin Assessment (not related to wound area) ASSESSMENTS - Focused Assessment []  - 0 Circumferential Edema Measurements - multi extremities []  - 0 Nutritional Assessment / Counseling / Intervention []  - 0 Lower Extremity Assessment (monofilament, tuning fork, pulses) []  - 0 Peripheral Arterial Disease Assessment (using hand held doppler) ASSESSMENTS - Ostomy and/or Continence Assessment and Care []  - 0 Incontinence Assessment and Management []  - 0 Ostomy Care Assessment and Management (repouching, etc.) PROCESS - Coordination of Care X - Simple Patient / Family Education for ongoing care 1 15 []  - 0 Complex (extensive) Patient / Family Education for ongoing care []  - 0 Staff obtains Chiropractor, Records, T Results / Process Orders est []  - 0 Staff telephones HHA, Nursing Homes / Clarify orders / etc []  - 0 Routine Transfer to another Facility (non-emergent condition) []  - 0 Routine Hospital Admission (non-emergent condition) []  - 0 New Admissions / Manufacturing engineer / Ordering NPWT Apligraf, etc. , []  - 0 Emergency Hospital Admission (emergent condition) X- 1 10 Simple Discharge Coordination []  - 0 Complex (extensive) Discharge Coordination PROCESS - Special Needs []  - 0 Pediatric / Minor Patient Management []  - 0 Isolation Patient Management []  - 0 Hearing / Language / Visual special needs []  - 0 Assessment of Community assistance  (transportation, D/C planning, etc.) []  - 0 Additional assistance / Altered mentation []  - 0 Support Surface(s) Assessment (bed, cushion, seat, etc.) INTERVENTIONS - Wound Cleansing /  Measurement []  - 0 Simple Wound Cleansing - one wound X- 2 5 Complex Wound Cleansing - multiple wounds X- 1 5 Wound Imaging (photographs - any number of wounds) []  - 0 Wound Tracing (instead of photographs) []  - 0 Simple Wound Measurement - one wound X- 2 5 Complex Wound Measurement - multiple wounds INTERVENTIONS - Wound Dressings X - Small Wound Dressing one or multiple wounds 2 10 []  - 0 Medium Wound Dressing one or multiple wounds []  - 0 Large Wound Dressing one or multiple wounds []  - 0 Application of Medications - topical []  - 0 Application of Medications - injection INTERVENTIONS - Miscellaneous []  - 0 External ear exam AZAIAH, Lawrence [name] ([id number]) [id number] Page 3 of 10 []  - 0 Specimen Collection (cultures, biopsies, blood, body fluids, etc.) []  - 0 Specimen(s) / Culture(s) sent or taken to Lab for analysis []  - 0 Patient Transfer (multiple staff / Michiel Sites Lift / Similar devices) []  - 0 Simple Staple / Suture removal (25 or less) []  - 0 Complex Staple / Suture removal (26 or more) []  - 0 Hypo / Hyperglycemic Management (close monitor of Blood Glucose) []  - 0 Ankle / Brachial Index (ABI) - do not check if billed separately X- 1 5 Vital Signs Has the patient been seen at the hospital within the last three years: Yes Total Score: 100 Level Of Care: New/Established - Level 3 Electronic Signature(s) Unsigned Entered ByYevonne [name] on 06/12/2023 10:51:12 -------------------------------------------------------------------------------- Encounter Discharge Information Details Patient Name: Date of Service: [name], [name] 06/12/2023 9:45 A M Medical Record Number: [id number] Patient Account Number: [id number] Date of Birth/Sex: Treating  RN: July 28, 1951 (72 y.o. Lawrence [name]) Lawrence [name] Primary Care : Lawrence [name] Other Clinician: Referring : Treating /Extender: Lawrence [name] in Treatment: 19 Encounter Discharge Information Items Post Procedure Vitals Discharge Condition: Stable Temperature (F): 97.9 Ambulatory Status: Cane Pulse (bpm): 68 Discharge Destination: Home Respiratory Rate (breaths/min): 18 Transportation: Private Auto Blood Pressure (mmHg): 116/55 Accompanied By: wife Schedule Follow-up Appointment: Yes Clinical Summary of Care: Electronic Signature(s) Signed: 06/12/2023 10:52:15 AM By: Lawrence Pax RN Entered By: Lawrence [name] on 06/12/2023 10:52:15 Lower Extremity Assessment Details -------------------------------------------------------------------------------- Lawrence [name] ([id number]) [id number] Page 4 of 10 Patient Name: Date of Service: [name], [name] 06/12/2023 9:45 A M Medical Record Number: [id number] Patient Account Number: [id number] Date of Birth/Sex: Treating RN: 1951/03/23 (72 y.o. Lawrence [name]) Lawrence [name] Primary Care : Lawrence [name] Other Clinician: Referring : Treating /Extender: Lawrence [name] in Treatment: 19 Edema Assessment Left: Right: Assessed: No No Edema: No No Calf Left: Right: Point of Measurement: 35 cm From Medial Instep 48 cm 42 cm Ankle Left: Right: Point of Measurement: 10 cm From Medial Instep 22 cm 27 cm Vascular Assessment Left: Right: Pulses: Dorsalis Pedis Palpable: Yes Yes Extremity colors, hair growth, and conditions: Extremity Color: Normal Normal Hair Growth on Extremity: No No Temperature of Extremity: Warm Warm Capillary Refill: < 3 seconds < 3 seconds Dependent Rubor: No No Blanched when Elevated: No No Lipodermatosclerosis: No No Toe Nail Assessment Left: Right: Thick: Yes Yes Discolored: Yes Yes Deformed: Yes Yes Improper  Length and Hygiene: Yes Yes Electronic Signature(s) Signed: 06/12/2023 10:15:45 AM By: Lawrence Pax RN Entered By: Lawrence [name] on 06/12/2023 10:15:45 -------------------------------------------------------------------------------- Multi Wound Chart Details Patient Name: Date of Service: [name], [name] 06/12/2023 9:45 A M Medical Record Number: [id number] Patient Account Number: [id number] Date of Birth/Sex:  Treating RN: [birth year] (72 y.o. [name] Primary Care : Lawrence [name] Other Clinician: Referring : Treating /Extender: Lawrence [name] in Treatment: 19 Vital Signs Height(in): 70 Pulse(bpm): 68 Weight(lbs): 302 Blood Pressure(mmHg): 116/55 Body Mass Index(BMI): 43.3 Temperature(F): 97.9 SAIM, MCQUARY ([id number]) [id number] Page 5 of 10 Respiratory Rate(breaths/min): 18 [1:Photos:] [N/A:N/A] Right T Second oe Left T Third oe N/A Wound Location: Gradually Appeared Gradually Appeared N/A Wounding Event: Diabetic Wound/Ulcer of the Lower Diabetic Wound/Ulcer of the Lower N/A Primary Etiology: Extremity Extremity Glaucoma, Coronary Artery Disease, Glaucoma, Coronary Artery Disease, N/A Comorbid History: Hypertension, Myocardial Infarction, Hypertension, Myocardial Infarction, Type II Diabetes, End Stage Renal Type II Diabetes, End Stage Renal Disease Disease 11/03/2022 12/05/2022 N/A Date Acquired: 71 19 N/A Weeks of Treatment: Open Open N/A Wound Status: No No N/A Wound Recurrence: Yes Yes N/A Pending A mputation on Presentation: 0.9x1x0.2 0.5x0.6x0.1 N/A Measurements L x W x D (cm) 0.707 0.236 N/A A (cm) : rea 0.141 0.024 N/A Volume (cm) : 9.90% 87.50% N/A % Reduction in A rea: 40.30% 93.60% N/A % Reduction in Volume: Grade 3 Grade 3 N/A Classification: Medium Medium N/A Exudate A mount: Serosanguineous Serosanguineous N/A Exudate Type: red, brown red, brown  N/A Exudate Color: Distinct, outline attached Distinct, outline attached N/A Wound Margin: Large (67-100%) None Present (0%) N/A Granulation A mount: Pink N/A N/A Granulation Quality: Small (1-33%) Large (67-100%) N/A Necrotic A mount: Fat Layer (Subcutaneous Tissue): Yes Fat Layer (Subcutaneous Tissue): Yes N/A Exposed Structures: Fascia: No Fascia: No Tendon: No Tendon: No Muscle: No Muscle: No Joint: No Joint: No Bone: No Bone: No None None N/A Epithelialization: Treatment Notes Electronic Signature(s) Signed: 06/12/2023 10:15:50 AM By: Lawrence Pax RN Entered By: Lawrence [name] on 06/12/2023 10:15:50 -------------------------------------------------------------------------------- Multi-Disciplinary Care Plan Details Patient Name: Date of Service: [name] MS, [name] 06/12/2023 9:45 A M Medical Record Number: [id number] Patient Account Number: [id number] Date of Birth/Sex: Treating RN: [birth year] (72 y.o. [name] Primary Care : Lawrence [name] Other Clinician: Referring : Treating /Extender: Lawrence [name] in Treatment: [address], [name] ([id number]) [phone].pdf Page 6 of 10 Active Inactive Electronic Signature(s) Signed: 06/12/2023 10:16:12 AM By: Lawrence Pax RN Entered By: Lawrence [name] on 06/12/2023 10:16:12 -------------------------------------------------------------------------------- Pain Assessment Details Patient Name: Date of Service: [name], [name] 06/12/2023 9:45 A M Medical Record Number: [id number] Patient Account Number: [id number] Date of Birth/Sex: Treating RN: [birth year] (72 y.o. Lawrence [name]) Lawrence [name] Primary Care : Lawrence [name] Other Clinician: Referring : Treating /Extender: Lawrence [name] in Treatment: 19 Active Problems Location of Pain Severity and Description of Pain Patient Has Paino No Site Locations Pain  Management and Medication Current Pain Management: Electronic Signature(s) Signed: 06/12/2023 10:12:16 AM By: Lawrence Pax RN Entered By: Lawrence [name] on 06/12/2023 10:12:15 Lawrence [name] ([id number]) [id number] Page 7 of 10 -------------------------------------------------------------------------------- Patient/Caregiver Education Details Patient Name: Date of Service: [name], [name] 8/9/2024andnbsp9:45 A M Medical Record Number: [id number] Patient Account Number: [id number] Date of Birth/Gender: Treating RN: 04-Nov-1950 (72 y.o. Lawrence [name]) Lawrence [name] Primary Care Physician: Lawrence [name] Other Clinician: Referring Physician: Treating Physician/Extender: Lawrence [name] in Treatment: 19 Education Assessment Education Provided To: Patient Education Topics Provided Wound/Skin Impairment: Handouts: Caring for Your Ulcer Methods: Explain/Verbal Responses: State content correctly Electronic Signature(s) Unsigned Entered ByYevonne [name] on 06/12/2023 10:16:26 -------------------------------------------------------------------------------- Wound Assessment Details Patient Name: Date of Service: [name], GA RLA ND  W. 06/12/2023 9:45 A M Medical Record Number: [id number] Patient Account Number: [id number] Date of Birth/Sex: Treating RN: [birth year] (72 y.o. Lawrence [name]) Lawrence [name] Primary Care : Lawrence [name] Other Clinician: Referring : Treating /Extender: Lawrence [name] in Treatment: 19 Wound Status Wound Number: 1 Primary Diabetic Wound/Ulcer of the Lower Extremity Etiology: Wound Location: Right T Second oe Wound Open Wounding Event: Gradually Appeared Status: Date Acquired: 11/03/2022 Comorbid Glaucoma, Coronary Artery Disease, Hypertension, Myocardial Weeks Of Treatment: 19 History: Infarction, Type II Diabetes, End Stage Renal Disease Clustered Wound: No Pending Amputation On  Presentation Photos SOLIMAN, PULEIO ([id number]) [id number] Page 8 of 10 Wound Measurements Length: (cm) 0.9 Width: (cm) 1 Depth: (cm) 0.2 Area: (cm) 0.707 Volume: (cm) 0.141 % Reduction in Area: 9.9% % Reduction in Volume: 40.3% Epithelialization: None Tunneling: No Undermining: No Wound Description Classification: Grade 3 Wound Margin: Distinct, outline attached Exudate Amount: Medium Exudate Type: Serosanguineous Exudate Color: red, brown Foul Odor After Cleansing: No Slough/Fibrino Yes Wound Bed Granulation Amount: Large (67-100%) Exposed Structure Granulation Quality: Pink Fascia Exposed: No Necrotic Amount: Small (1-33%) Fat Layer (Subcutaneous Tissue) Exposed: Yes Tendon Exposed: No Muscle Exposed: No Joint Exposed: No Bone Exposed: No Treatment Notes Wound #1 (Toe Second) Wound Laterality: Right Cleanser Soap and Water Discharge Instruction: Gently cleanse wound with antibacterial soap, rinse and pat dry prior to dressing wounds Peri-Wound Care Topical Primary Dressing Hydrofera Blue Ready Transfer Foam, 2.5x2.5 (in/in) Discharge Instruction: Apply Hydrofera Blue Ready to wound bed as directed Secondary Dressing Coverlet Latex-Free Fabric Adhesive Dressings Discharge Instruction: 1.5 x 2 Secured With Compression Wrap Compression Stockings Add-Ons Electronic Signature(s) Signed: 06/12/2023 10:14:32 AM By: Lawrence Pax RN Entered By: Lawrence [name] on 06/12/2023 10:14:31 -------------------------------------------------------------------------------- Wound Assessment Details Patient Name: Date of Service: [name], [name] 06/12/2023 9:45 A M Medical Record Number: [id number] Patient Account Number: [id number] Date of Birth/Sex: Treating RN: Mar 13, 1951 (72 y.o. [name] Primary Care : Lawrence [name] Other Clinician: Referring : Treating /Extender: Parks Neptune  ([id number]) [phone].pdf Page 9 of 10 Weeks in Treatment: 19 Wound Status Wound Number: 4 Primary Diabetic Wound/Ulcer of the Lower Extremity Etiology: Wound Location: Left T Third oe Wound Open Wounding Event: Gradually Appeared Status: Date Acquired: 12/05/2022 Comorbid Glaucoma, Coronary Artery Disease, Hypertension, Myocardial Weeks Of Treatment: 19 History: Infarction, Type II Diabetes, End Stage Renal Disease Clustered Wound: No Pending Amputation On Presentation Photos Wound Measurements Length: (cm) 0.5 Width: (cm) 0.6 Depth: (cm) 0.1 Area: (cm) 0.236 Volume: (cm) 0.024 % Reduction in Area: 87.5% % Reduction in Volume: 93.6% Epithelialization: None Tunneling: No Undermining: No Wound Description Classification: Grade 3 Wound Margin: Distinct, outline attached Exudate Amount: Medium Exudate Type: Serosanguineous Exudate Color: red, brown Foul Odor After Cleansing: No Slough/Fibrino Yes Wound Bed Granulation Amount: None Present (0%) Exposed Structure Necrotic Amount: Large (67-100%) Fascia Exposed: No Necrotic Quality: Adherent Slough Fat Layer (Subcutaneous Tissue) Exposed: Yes Tendon Exposed: No Muscle Exposed: No Joint Exposed: No Bone Exposed: No Treatment Notes Wound #4 (Toe Third) Wound Laterality: Left Cleanser Soap and Water Discharge Instruction: Gently cleanse wound with antibacterial soap, rinse and pat dry prior to dressing wounds Peri-Wound Care Topical Primary Dressing Hydrofera Blue Ready Transfer Foam, 2.5x2.5 (in/in) Discharge Instruction: Apply Hydrofera Blue Ready to wound bed as directed Secondary Dressing Coverlet Latex-Free Fabric Adhesive Dressings Discharge Instruction: 1.5 x 2 Secured With Compression Wrap Compression Stockings Add-Ons TORRANCE, HON ([id number]) [id number] Page 10 of 10 Electronic Signature(s) Signed:  06/12/2023 10:13:55 AM By: Lawrence Pax RN Entered  By: Lawrence [name] on 06/12/2023 10:13:55 -------------------------------------------------------------------------------- Vitals Details Patient Name: Date of Service: [name], [name] 06/12/2023 9:45 A M Medical Record Number: [id number] Patient Account Number: [id number] Date of Birth/Sex: Treating RN: August 11, 1951 (72 y.o. Lawrence [name]) Lawrence [name] Primary Care : Lawrence [name] Other Clinician: Referring : Treating /Extender: Lawrence [name] in Treatment: 19 Vital Signs Time Taken: 10:00 Temperature (F): 97.9 Height (in): 70 Pulse (bpm): 68 Weight (lbs): 302 Respiratory Rate (breaths/min): 18 Body Mass Index (BMI): 43.3 Blood Pressure (mmHg): 116/55 Reference Range: 80 - 120 mg / dl Electronic Signature(s) Signed: 06/12/2023 10:12:05 AM By: Lawrence Pax RN Entered By: Lawrence [name] on 06/12/2023 10:12:05

## 2023-06-18 ENCOUNTER — Other Ambulatory Visit: Payer: Self-pay | Admitting: Podiatry

## 2023-06-18 DIAGNOSIS — Z9889 Other specified postprocedural states: Secondary | ICD-10-CM

## 2023-06-19 ENCOUNTER — Encounter: Payer: Medicare Other | Admitting: Physician Assistant

## 2023-06-19 ENCOUNTER — Ambulatory Visit (INDEPENDENT_AMBULATORY_CARE_PROVIDER_SITE_OTHER): Payer: Medicare Other | Admitting: Podiatry

## 2023-06-19 DIAGNOSIS — L97512 Non-pressure chronic ulcer of other part of right foot with fat layer exposed: Secondary | ICD-10-CM | POA: Diagnosis not present

## 2023-06-19 DIAGNOSIS — E1069 Type 1 diabetes mellitus with other specified complication: Secondary | ICD-10-CM | POA: Diagnosis not present

## 2023-06-19 DIAGNOSIS — E0843 Diabetes mellitus due to underlying condition with diabetic autonomic (poly)neuropathy: Secondary | ICD-10-CM

## 2023-06-19 NOTE — Progress Notes (Signed)
No chief complaint on file.   Subjective:  Patient presents today for follow-up evaluation of ulcers to the bilateral toes.  He has been treated and managed at the [facility] wound care center.  Unfortunately he now has exposed bone to the right second digit with a nonhealing nature of the wound.  The wound care center referred him back to me to discuss possible surgical amputation of the toes.  Past Medical History:  Diagnosis Date   Allergies    Bronchitis    PMH   CAD (coronary artery disease)    07/2007   Diabetes (HCC)    Elevated lipids    Glaucoma    HBP (high blood pressure)    Headache    Myocardial infarction (HCC)    Neuropathy    Osteoarthritis    PAD (peripheral artery disease) (HCC)    a. 03/2022 Lower Ext Angio: No signif Ao-iliac dzs. R PT diff dzs and occluded above ankle. R Pedal arch intact w/ excellent antegrade flow provided by R AT-->no indication for revasc.   Skin cancer    Skin ulcer of right great toe, unspecified ulcer stage (HCC)    Sleep apnea    CPAP   Wears glasses    Wears partial dentures     Past Surgical History:  Procedure Laterality Date   ABDOMINAL AORTOGRAM W/LOWER EXTREMITY N/A 03/26/2022   Procedure: ABDOMINAL AORTOGRAM W/LOWER EXTREMITY;  Surgeon: Iran Ouch, MD;  Location: [facility];  Service: Cardiovascular;  Laterality: N/A;   APPENDECTOMY     CARDIAC CATHETERIZATION N/A 06/11/2016   Procedure: Left Heart Cath and Coronary Angiography;  Surgeon: Lamar Blinks, MD;  Location: [facility] INVASIVE CV [facility];  Service: Cardiovascular;  Laterality: N/A;   CARPAL TUNNEL RELEASE Left 04/24/2016   Procedure: CARPAL TUNNEL RELEASE;  Surgeon: Kennedy Bucker, MD;  Location: [facility] ORS;  Service: Orthopedics;  Laterality: Left;   CATARACT EXTRACTION W/ INTRAOCULAR LENS  IMPLANT, BILATERAL Bilateral    CATARACT EXTRACTION, BILATERAL     R eye 07/16/12, L eye 08/13/12 - with lens implant in both eyes   CORONARY ARTERY BYPASS GRAFT N/A 06/27/2016    Procedure: CORONARY ARTERY BYPASS GRAFTING times four using left internal mammary artery and right leg saphenous vein;  Surgeon: Kerin Perna, MD;  Location: [facility] OR;  Service: Open Heart Surgery;  Laterality: N/A;   EYE SURGERY Bilateral    JOINT REPLACEMENT     KNEE ARTHROPLASTY Left 02/11/2016   Procedure: COMPUTER ASSISTED TOTAL KNEE ARTHROPLASTY;  Surgeon: Donato Heinz, MD;  Location: [facility] ORS;  Service: Orthopedics;  Laterality: Left;   KNEE ARTHROSCOPY Right    LEFT HEART CATH AND CORS/GRAFTS ANGIOGRAPHY Left 01/13/2017   Procedure: Left Heart Cath and Cors/Grafts Angiography;  Surgeon: Lamar Blinks, MD;  Location: [facility] INV[facility] CV [facility];  Service: Cardiovascular;  Laterality: Left;   LEFT HEART CATH AND CORS/GRAFTS ANGIOGRAPHY Left 06/09/2023   Procedure: LEFT HEART CATH AND CORS/GRAFTS ANGIOGRAPHY;  Surgeon: Marcina Millard, MD;  Location: [facility] INVASIVE CV LAB;  Service: Cardiovascular;  Laterality: Left;   METATARSAL HEAD EXCISION Right 03/21/2020   Procedure: METATARSAL HEAD RESECTION RIGHT AND DEBRIDEMENT OF ULCER;  Surgeon: Felecia Shelling, DPM;  Location: [facility] OR;  Service: Podiatry;  Laterality: Right;   MULTIPLE TOOTH EXTRACTIONS     TEE WITHOUT CARDIOVERSION N/A 06/27/2016   Procedure: TRANSESOPHAGEAL ECHOCARDIOGRAM (TEE);  Surgeon: Kerin Perna, MD;  Location: [facility] OR;  Service: Open Heart Surgery;  Laterality: N/A;  Allergies  Allergen Reactions   Nitroglycerin Nausea Only and Other (See Comments)    Patches only - headache    Statins Other (See Comments)    BODY PAIN, Pt taking Crestor*      Fluvastatin Other (See Comments)    Other reaction(s): Muscle pain   Fluvastatin Sodium     Other reaction(s): Myalgias   Rosuvastatin Other (See Comments)    Other reaction(s): Muscle pain, Myalgias   Simvastatin Other (See Comments)    Other reaction(s): Muscle pain, Myalgias   Ezetimibe Other (See Comments)    Other reaction(s): Muscle Pain   Lipitor  [Atorvastatin] Other (See Comments) and Rash    BODY PAIN   Lisinopril Other (See Comments) and Nausea Only    Light headed, dizzy Other reaction(s): Dizziness    LT foot 12/30/2022  RT foot 12/30/2022   LT foot 01/13/2023  RT foot 01/13/2023   RT foot 06/19/2023  LT foot 06/19/2023   Objective/Physical Exam Vascular status intact.  Chronic edema noted bilateral lower extremities  Unfortunately there is exposed bone to the ulcer site of the right second toe.  Good proximal phalanx is exposed.  No purulence or erythema concerning for acute infection.  Ulcer also to the left third toe which extends into deeper tissue adjacent to the bone.  Fibrotic wound base.  Again, no indication of acute infection.  Radiographic Exam RT foot 12/30/2022:  Absence of the distal middle phalanx of the right second toe noted.  Prior amputations of the great toe also.  No osseous erosions or cortical irregularities that would be concerning for osteomyelitis are noted to the second digit of the right foot and third digit of the left specifically  Assessment: 1. s/p partial toe amputation right second. DOS: 10/28/2022 2.  Ulcers lesser digits bilateral 3.  Exposed bone right second digit  -Patient evaluated.   -Continue open toed postsurgical shoes -Medically necessary excisional debridement including subcutaneous tissue was performed today using a tissue nipper.  Excisional debridement of the necrotic nonviable tissue down to healthier bleeding viable tissue was performed with postdebridement measurement same as pre- -patient has been treated at the [facility] wound care center and their care has been exceptional.  Unfortunately there is exposed bone to the right second digit with evidence of nonhealing.  I do believe amputation of the toes is appropriate at this time.  Also partial toe amputation to the left third toe for primary closure of the ulcer site.  This was discussed and the patient agrees would like  to proceed.  Risk benefits advantages and disadvantages of the procedure were explained.  No guarantees were expressed or implied.  All patient questions were answered. -Authorization for surgery was initiated today.  Surgery will consist of right second toe amputation of the proximal phalanx.  Left third toe partial amputation.  Requested to be at [facility] -Return to clinic 1 week postop   Felecia Shelling, DPM [facility]  Dr. Felecia Shelling, DPM    [address] [place], [place] [address]                Office [phone]  Fax [phone]

## 2023-06-19 NOTE — Progress Notes (Signed)
Lawrence [name], Lawrence [name] ([id number]) [id number] Page 1 of 8 Visit Report for 06/19/2023 Chief Complaint Document Details Patient Name: Date of Service: Lawrence [name], Lawrence RLA ND W. 06/19/2023 12:00 PM Medical Record Number: [id number] Patient Account Number: [id number] Date of Birth/Sex: Treating RN: 24-Jul-1951 (72 y.o. Lawrence [name] Primary Care Provider: [name] Other Clinician: Betha Loa Referring Provider: Treating Provider/Extender: [name] in Treatment: 20 Information Obtained from: Patient Chief Complaint Bilateral foot ulcers Electronic Signature(s) Signed: 06/19/2023 12:18:17 PM By: [name] Entered By: [name] on 06/19/2023 09:18:17 -------------------------------------------------------------------------------- HPI Details Patient Name: Date of Service: Lawrence [name], Lawrence RLA ND W. 06/19/2023 12:00 PM Medical Record Number: [id number] Patient Account Number: [id number] Date of Birth/Sex: Treating RN: December 17, 1950 (72 y.o. Lawrence [name] Primary Care Provider: [name] Other Clinician: Betha Loa Referring Provider: Treating Provider/Extender: [name] in Treatment: 20 History of Present Illness HPI Description: 01-30-2023 upon evaluation today patient appears to be doing poorly currently in regard to his feet bilaterally. He is actually referral from Dr. Gala Lewandowsky who is Lawrence local podiatrist. Subsequently Dr. Logan Bores has been seeing him but wanted to refer him to [place] due to the fact that he is having difficulty healing in regard to his feet bilaterally. He has had several amputations. 1 back in the summer 2023 of the great toe right foot. Subsequently had Lawrence partial digit amputation of the second toe that was performed in November 2023. Subsequently since that time he had Lawrence hard time getting this to heal. Fortunately there does not appear to be any signs of infection has been using  gentamicin currently as the treatment of choice. With that being said I feel like that we need to try to see about drying some of these wounds off of it which is my biggest concern at this point. Also think that he could potentially have osteomyelitis of this right foot he has not had an x-ray of the left foot [facility] suggest MRI right foot and x-ray left foot he is already had an x-ray of the right foot at Dr. Logan Bores office and that did not reveal obvious signs of osteomyelitis but he does have bone exposure in the distal portion of the second toe right foot digit. Patient does have Lawrence history significant for diabetes mellitus type 1, hypertension, peripheral vascular disease, chronic kidney disease stage IIIa. He also has undergone arterial study which was actually performed on 12-08-2021 and this shows that he actually has good arterial flow into the right lower extremity and left lower extremity for that matter. He had noncompressible ABIs but on the left he had Lawrence TBI of 0.68 and in regard to his flow it was actually better on the right even compared to the left with good pressures at all locations and again there does not appear to be any significant peripheral vascular disease that would prevent healing. 02-06-2023 upon evaluation today patient appears to be doing about the same in regard to his wounds. There is quite Lawrence bit of maceration of the great toe on the right foot does appear to be healed the remainder of the wounds all appear to be Lawrence little bit macerated. I think that he may need to change this dressing Lawrence little Lawrence [name], Lawrence [name] ([id number]) [id number] Page 2 of 8 bit more frequently. We have been using Lawrence silver alginate dressing. 02-13-2023 upon evaluation today patient unfortunately is continuing to have some issues here with purulent drainage from  his toes there is also an odor that both his wife and the nurse noted although I was not able to detect this due to my  lack of smell. Nonetheless I do believe that this is something where we need to be Lawrence little more aggressive than the treatment of the wound to be perfectly honest. I discussed this with the patient today and he is in agreement with this plan. I am going to send in those antibiotics for him today. 02-20-2023 upon evaluation today patient appears to be doing decently well in regard to his wounds he is tolerating the antibiotics without complication and overall seems to be doing well in that regard. Fortunately I do not see any signs of active infection locally nor systemically at this time. 02-27-2023 upon evaluation today patient appears to be doing well currently in regard to his wound. He has been tolerating the dressing changes without complication. Fortunately there does not appear to be any signs of infection we are using silver alginate dressings and then subsequently the Band-Aids to secure in place. He also seems to be doing well with the hyperbarics at this point which is great news. 03-06-2023 upon evaluation today patient's wounds show signs of improvement although this is going slowly nonetheless we are making improvement here of the same. Fortunately I do not see any signs of active infection locally nor systemically which is great news. 03-13-2023 upon evaluation today patient appears to be doing well currently in regard to his wounds. He is actually showing signs of improvement he does have his tubes and so we should be good to get him started back on hyperbarics on Monday. Fortunately there does not appear to be any signs of active infection locally nor systemically which is great news. No fevers, chills, nausea, vomiting, or diarrhea. He does need refills of his medications he never got the prescription from [facility]. 03-20-2023 upon evaluation today patient appears to be doing well currently in regard to his wounds he is going require some sharp debridement today. Fortunately there  does not appear to be any signs of active infection locally nor systemically which is great news. 5/24; this is Lawrence patient currently undergoing HBO for chronic osteomyelitis in the toes of his bilateral feet. Using silver alginate to wounds on the right second remanent, right third and left third toes. Our intake nurse reports weeping fluid coming about of the major wounds on the right second and left third toes. 04-02-2023 upon evaluation today patient appears to be doing well currently in regard to his wounds. He is actually showing some signs of improvement and actually very pleased with where we stand compared to what we have been previous. Fortunately I do not see any signs of infection at this point. 04-10-2023 upon evaluation today patient's wounds actually are showing signs of excellent improvement today. I am actually very pleased with where we stand I think that he is making great progress and I think we are very close to seeing this completely turned around to where we can have new skin growing over top of this already the granulation tissue is skilled in I see no bone exposure on evaluation today which is also news. 04-17-2023 upon evaluation today patient appears to be doing well currently in regard to his wounds which are actually showing signs of improvement and very pleased with where we stand. Fortunately I do not see any evidence of active infection locally or systemically which is great news. No fevers, chills, nausea,  vomiting, or diarrhea. 04-24-2023 upon evaluation today patient appears to be doing well currently in regard to his wounds. He is showing signs of continued and appropriate improvement. I am actually very pleased with where things stand today. I do not see any signs of active infection at this time which is great news. No fevers, chills, nausea, vomiting, or diarrhea. 05-01-2023 upon evaluation today patient's wounds are showing signs of improvement. With that being said he  is going require some sharp debridement today. 05-08-2023 upon evaluation patient's wound bed actually showed signs at all locations of doing much better. Fortunately there does not appear to be any evidence of active infection which is great and in general I think that he is making excellent progress towards closure. Fortunately I do not see any evidence of infection locally or systemically which is great news. Of note the patient will be having Lawrence cardiac stress test coming up I believe next week. Following that he may be proceeding to catheterization. At this point we have the HBO on hold due to the cardiac issues I am not sure when or if we will get back to that to be perfectly honest. 05-15-2023 upon evaluation today patient appears to be doing well currently in regard to his wounds we unfortunately still have the hyperbaric oxygen therapy on hold due to the fact that he is at this point still being worked up by his cardiologist he has his first test which will be the stress test on Tuesday the 15th of this month. Thus and just Lawrence few days. Following that he is likely going to have to have Lawrence heart catheterization. All this has been have to be done and cleared before he could consider getting back into the chamber. He is aware of this and will just kind of keep things moving along in the meantime. 7/26; this is Lawrence patient we you had in hyperbarics for Wagner 4 grade 1 wound in the bilateral feet. He has completed his antibiotics unfortunately he developed what we interpreted as unstable angina. He is going for cardiac cath on August 6 and his hyperbarics is on hold. He has wounds on the remanent of his right second and his left third toe. We have been using Hydrofera Blue. 06-05-2023 upon evaluation today patient appears to be doing well in some cases and Lawrence little bit worse in regard to others. I do think right now that he is having some significant issues here with Lawrence wound on the right second toe.  Unfortunately the bone is starting to protrude even more from this toe and I think that were not really good I will likely be able to get tissue coverage due to the fact that the bone is protruding past the point of tissue coverage. With that being said the third toe on the left actually appears to be doing really well. I am very pleased I think this is healing quite nicely. 06-12-2023 upon evaluation today patient appears to be doing well currently in regard to his left third toe which is actually doing quite well. With that being said the second toe right foot is still showing signs of bone exposure and again this is something that I have gotten in touch with Dr. Logan Bores and the plan is to evaluate next Friday and Dr. Logan Bores been to see if he thinks surgery could be beneficial for the patient or not. With that being said the good news is his heart catheterization checked out okay. 06-19-2023 upon evaluation  today patient's wounds actually appear to be doing about the same. He did see Dr. Logan Bores today and subsequently Dr. Logan Bores is recommending surgical intervention for both toes. He has been I tried to clean up the distal portion of the left foot toe ulcer and then actually sutured this closed to allow for appropriate healing. When it comes to the second toe right foot the plan is to amputate the remaining portion of the stump so this could heal appropriately. Electronic Signature(s) Signed: 06/19/2023 1:49:16 PM By: [name] Entered By: [name] on 06/19/2023 10:49:15 Lawrence [name], Lawrence [name] ([id number]) [id number] Page 3 of 8 -------------------------------------------------------------------------------- Physical Exam Details Patient Name: Date of Service: Lawrence [name], Lawrence RLA ND W. 06/19/2023 12:00 PM Medical Record Number: [id number] Patient Account Number: [id number] Date of Birth/Sex: Treating RN: 12/03/1950 (72 y.o. Lawrence [name] Primary Care Provider: [name] Other Clinician: Betha Loa Referring Provider: Treating Provider/Extender: [name] in Treatment: 20 Constitutional Obese and well-hydrated in no acute distress. Respiratory normal breathing without difficulty. Psychiatric this patient is able to make decisions and demonstrates good insight into disease process. Alert and Oriented x 3. pleasant and cooperative. Notes Upon inspection patient's wound bed actually showed signs of good granulation and epithelization for the most part there was some slough and biofilm buildup but at the same time there is also bone exposed in the second toe right foot this is what has necessitated going back to discuss surgical options. Electronic Signature(s) Signed: 06/19/2023 1:49:47 PM By: [name] Entered By: [name] on 06/19/2023 10:49:46 -------------------------------------------------------------------------------- Physician Orders Details Patient Name: Date of Service: Lawrence [name], Lawrence RLA ND W. 06/19/2023 12:00 PM Medical Record Number: [id number] Patient Account Number: [id number] Date of Birth/Sex: Treating RN: 1951/03/20 (72 y.o. 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 - end of week Return Lawrence Anesthetic (Use 'Patient Medications' Section for Anesthetic Order Entry) Lidocaine applied to wound bed Edema Control - Lymphedema / Segmental Compressive Device / Other Bilateral Lower Extremities Tubigrip double layer applied - size D; size E today d/t stock availability Elevate, Exercise Daily and Lawrence void Standing for Long Periods of Time. Elevate legs to the level of the heart and pump ankles as often as possible Elevate leg(s) parallel to the floor when sitting. Off-Loading Open toe surgical shoe - right and left foot Wound Treatment Wound #1 - T Second oe Wound Laterality: Right Cleanser: Soap and Water 1 x Per Day/30 Days Discharge Instructions: Gently cleanse wound with antibacterial soap, rinse and pat dry prior to dressing wounds Prim Dressing: Hydrofera Blue Ready Transfer Foam, 2.5x2.5 (in/in) 1 x Per Day/30 Days ary Discharge Instructions: Apply Hydrofera Blue Ready to wound bed as directed Secondary Dressing: Coverlet Latex-Free Fabric Adhesive Dressings 1 x Per  Day/30 Days Discharge Instructions: 1.5 x 2 Wound #4 - T Third oe Wound Laterality: Left Cleanser: Soap and Water 1 x Per Day/30 Days Discharge Instructions: Gently cleanse wound with antibacterial soap, rinse and pat dry prior to dressing wounds Prim Dressing: Hydrofera Blue Ready Transfer Foam, 2.5x2.5 (in/in) 1 x Per Day/30 Days ary Discharge Instructions: Apply Hydrofera Blue Ready to wound bed as directed Secondary Dressing: Coverlet Latex-Free Fabric Adhesive Dressings 1 x Per Day/30 Days Discharge Instructions: 1.5 x 2 Electronic Signature(s) Signed: 06/19/2023 3:00:55 PM By: [name] Signed: 06/22/2023 4:40:06 PM By: Betha Loa Entered By: Betha Loa on 06/19/2023  10:22:47 -------------------------------------------------------------------------------- Problem List Details Patient Name: Date of Service: Lawrence [name], Lawrence RLA ND W. 06/19/2023 12:00 PM Medical Record Number: [id number] Patient Account Number: [id number] Date of Birth/Sex: Treating RN: 1951/08/02 (72 y.o. Lawrence [name] Primary Care Provider: [name] Other Clinician: Betha Loa Referring Provider: Treating Provider/Extender: [name] in Treatment: 20 Active Problems ICD-10 Encounter Code Description Active Date MDM Diagnosis M86.371 Chronic multifocal osteomyelitis, right ankle and foot 02/13/2023 No Yes Lawrence [name], Lawrence [name] ([id number]) [id number] Page 5 of 8 E10.621 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 01/30/2023 No Yes L97.522 Non-pressure chronic ulcer of other part of left foot with fat layer exposed 01/30/2023 No Yes L97.512 Non-pressure chronic ulcer of other part of right foot with fat layer exposed 01/30/2023 No Yes I25.10 Atherosclerotic heart disease of native coronary artery without angina pectoris 01/30/2023 No Yes I10 Essential (primary) hypertension 01/30/2023 No Yes I73.89 Other specified peripheral vascular diseases 01/30/2023 No Yes N18.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, stage 3a 01/30/2023 No Yes Inactive Problems Resolved Problems Electronic Signature(s) Signed: 06/19/2023 12:18:12 PM By: [name] Entered By: [name] on 06/19/2023 09:18:12 -------------------------------------------------------------------------------- Progress Note Details Patient Name: Date of Service: Lawrence [name], Lawrence RLA ND W. 06/19/2023 12:00 PM Medical Record Number: [id number] Patient Account Number: [id number] Date of Birth/Sex: Treating RN: 10/02/1951 (72 y.o. Lawrence [name] Primary Care Provider: [name] Other Clinician: Betha Loa Referring Provider: Treating Provider/Extender: [name] in  Treatment: 20 Subjective Chief Complaint Information obtained from Patient Bilateral foot ulcers History of Present Illness (HPI) 01-30-2023 upon evaluation today patient appears to be doing poorly currently in regard to his feet bilaterally. He is actually referral from Dr. Gala Lewandowsky who is Lawrence local podiatrist. Subsequently Dr. Logan Bores has been seeing him but wanted to refer him to [place] due to the fact that he is having difficulty healing in regard to his feet bilaterally. He has had several amputations. 1 back in the summer 2023 of the great toe right foot. Subsequently had Lawrence partial digit amputation of the second toe that was performed in November 2023. Subsequently since that time he had Lawrence hard time getting this to heal. Fortunately there does not appear to be any signs of infection has been using gentamicin currently as the treatment of choice. With that being said I feel like that we need to try to see about drying some of these wounds off of it which is my biggest concern at this point. Also think that he could potentially have osteomyelitis of this right foot he has not had an x-ray of the left foot [facility] suggest MRI right foot and x-ray left foot he is already had an x-ray of the right foot at Dr. Logan Bores office and that [name] ([id number]) [id number] Page 6 of 8 did not reveal obvious signs of osteomyelitis but  he does have bone exposure in the distal portion of the second toe right foot digit. Patient does have Lawrence history significant for diabetes mellitus type 1, hypertension, peripheral vascular disease, chronic kidney disease stage IIIa. He also has undergone arterial study which was actually performed on 12-08-2021 and this shows that he actually has good arterial flow into the right lower extremity and left lower extremity for that matter. He had noncompressible ABIs but on the left he had Lawrence TBI of 0.68 and in regard to his flow it was actually better on  the right even compared to the left with good pressures at all locations and again there does not appear to be any significant peripheral vascular disease that would prevent healing. 02-06-2023 upon evaluation today patient appears to be doing about the same in regard to his wounds. There is quite Lawrence bit of maceration of the great toe on the right foot does appear to be healed the remainder of the wounds all appear to be Lawrence little bit macerated. I think that he may need to change this dressing Lawrence little bit more frequently. We have been using Lawrence silver alginate dressing. 02-13-2023 upon evaluation today patient unfortunately is continuing to have some issues here with purulent drainage from his toes there is also an odor that both his wife and the nurse noted although I was not able to detect this due to my lack of smell. Nonetheless I do believe that this is something where we need to be Lawrence little more aggressive than the treatment of the wound to be perfectly honest. I discussed this with the patient today and he is in agreement with this plan. I am going to send in those antibiotics for him today. 02-20-2023 upon evaluation today patient appears to be doing decently well in regard to his wounds he is tolerating the antibiotics without complication and overall seems to be doing well in that regard. Fortunately I do not see any signs of active infection locally nor systemically at this time. 02-27-2023 upon evaluation today patient appears to be doing well currently in regard to his wound. He has been tolerating the dressing changes without complication. Fortunately there does not appear to be any signs of infection we are using silver alginate dressings and then subsequently the Band-Aids to secure in place. He also seems to be doing well with the hyperbarics at this point which is great news. 03-06-2023 upon evaluation today patient's wounds show signs of improvement although this is going slowly nonetheless  we are making improvement here of the same. Fortunately I do not see any signs of active infection locally nor systemically which is great news. 03-13-2023 upon evaluation today patient appears to be doing well currently in regard to his wounds. He is actually showing signs of improvement he does have his tubes and so we should be good to get him started back on hyperbarics on Monday. Fortunately there does not appear to be any signs of active infection locally nor systemically which is great news. No fevers, chills, nausea, vomiting, or diarrhea. He does need refills of his medications he never got the prescription from [facility]. 03-20-2023 upon evaluation today patient appears to be doing well currently in regard to his wounds he is going require some sharp debridement today. Fortunately there does not appear to be any signs of active infection locally nor systemically which is great news. 5/24; this is Lawrence patient currently undergoing HBO for chronic osteomyelitis in the toes of his  bilateral feet. Using silver alginate to wounds on the right second remanent, right third and left third toes. Our intake nurse reports weeping fluid coming about of the major wounds on the right second and left third toes. 04-02-2023 upon evaluation today patient appears to be doing well currently in regard to his wounds. He is actually showing some signs of improvement and actually very pleased with where we stand compared to what we have been previous. Fortunately I do not see any signs of infection at this point. 04-10-2023 upon evaluation today patient's wounds actually are showing signs of excellent improvement today. I am actually very pleased with where we stand I think that he is making great progress and I think we are very close to seeing this completely turned around to where we can have new skin growing over top of this already the granulation tissue is skilled in I see no bone exposure on evaluation today  which is also news. 04-17-2023 upon evaluation today patient appears to be doing well currently in regard to his wounds which are actually showing signs of improvement and very pleased with where we stand. Fortunately I do not see any evidence of active infection locally or systemically which is great news. No fevers, chills, nausea, vomiting, or diarrhea. 04-24-2023 upon evaluation today patient appears to be doing well currently in regard to his wounds. He is showing signs of continued and appropriate improvement. I am actually very pleased with where things stand today. I do not see any signs of active infection at this time which is great news. No fevers, chills, nausea, vomiting, or diarrhea. 05-01-2023 upon evaluation today patient's wounds are showing signs of improvement. With that being said he is going require some sharp debridement today. 05-08-2023 upon evaluation patient's wound bed actually showed signs at all locations of doing much better. Fortunately there does not appear to be any evidence of active infection which is great and in general I think that he is making excellent progress towards closure. Fortunately I do not see any evidence of infection locally or systemically which is great news. Of note the patient will be having Lawrence cardiac stress test coming up I believe next week. Following that he may be proceeding to catheterization. At this point we have the HBO on hold due to the cardiac issues I am not sure when or if we will get back to that to be perfectly honest. 05-15-2023 upon evaluation today patient appears to be doing well currently in regard to his wounds we unfortunately still have the hyperbaric oxygen therapy on hold due to the fact that he is at this point still being worked up by his cardiologist he has his first test which will be the stress test on Tuesday the 15th of this month. Thus and just Lawrence few days. Following that he is likely going to have to have Lawrence heart  catheterization. All this has been have to be done and cleared before he could consider getting back into the chamber. He is aware of this and will just kind of keep things moving along in the meantime. 7/26; this is Lawrence patient we you had in hyperbarics for Wagner 4 grade 1 wound in the bilateral feet. He has completed his antibiotics unfortunately he developed what we interpreted as unstable angina. He is going for cardiac cath on August 6 and his hyperbarics is on hold. He has wounds on the remanent of his right second and his left third toe. We have been  using Hydrofera Blue. 06-05-2023 upon evaluation today patient appears to be doing well in some cases and Lawrence little bit worse in regard to others. I do think right now that he is having some significant issues here with Lawrence wound on the right second toe. Unfortunately the bone is starting to protrude even more from this toe and I think that were not really good I will likely be able to get tissue coverage due to the fact that the bone is protruding past the point of tissue coverage. With that being said the third toe on the left actually appears to be doing really well. I am very pleased I think this is healing quite nicely. 06-12-2023 upon evaluation today patient appears to be doing well currently in regard to his left third toe which is actually doing quite well. With that being said the second toe right foot is still showing signs of bone exposure and again this is something that I have gotten in touch with Dr. Logan Bores and the plan is to evaluate next Friday and Dr. Logan Bores been to see if he thinks surgery could be beneficial for the patient or not. With that being said the good news is his heart catheterization checked out okay. 06-19-2023 upon evaluation today patient's wounds actually appear to be doing about the same. He did see Dr. Logan Bores today and subsequently Dr. Logan Bores is recommending surgical intervention for both toes. He has been I tried to clean up  the distal portion of the left foot toe ulcer and then actually sutured this closed to allow for appropriate healing. When it comes to the second toe right foot the plan is to amputate the remaining portion of the stump so this could heal appropriately. Lawrence [name], Lawrence [name] ([id number]) [id number] Page 7 of 8 Objective Constitutional Obese and well-hydrated in no acute distress. Vitals Time Taken: 12:47 PM, Height: 70 in, Weight: 302 lbs, BMI: 43.3, Temperature: 97.8 F, Pulse: 76 bpm, Respiratory Rate: 18 breaths/min, Blood Pressure: 143/106 mmHg. Respiratory normal breathing without difficulty. Psychiatric this patient is able to make decisions and demonstrates good insight into disease process. Alert and Oriented x 3. pleasant and cooperative. General Notes: Upon inspection patient's wound bed actually showed signs of good granulation and epithelization for the most part there was some slough and biofilm buildup but at the same time there is also bone exposed in the second toe right foot this is what has necessitated going back to discuss surgical options. Integumentary (Hair, Skin) Wound #1 status is Open. Original cause of wound was Gradually Appeared. The date acquired was: 11/03/2022. The wound has been in treatment 20 weeks. The wound is located on the Right T Second. The wound measures 1cm length x 1.1cm width x 0.4cm depth; 0.864cm^2 area and 0.346cm^3 volume. There is Fat oe Layer (Subcutaneous Tissue) exposed. There is Lawrence medium amount of serosanguineous drainage noted. The wound margin is distinct with the outline attached to the wound base. There is large (67-100%) pink granulation within the wound bed. There is Lawrence small (1-33%) amount of necrotic tissue within the wound bed. Wound #4 status is Open. Original cause of wound was Gradually Appeared. The date acquired was: 12/05/2022. The wound has been in treatment 20 weeks. The wound is located on the Left T  Third. The wound measures 1cm length x 1cm width x 0.2cm depth; 0.785cm^2 area and 0.157cm^3 volume. There is Fat oe Layer (Subcutaneous Tissue) exposed. There is Lawrence medium amount of serosanguineous drainage noted. The wound margin  is distinct with the outline attached to the wound base. There is no granulation within the wound bed. There is Lawrence large (67-100%) amount of necrotic tissue within the wound bed including Adherent Slough. 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 - end of week Anesthetic (Use 'Patient Medications' Section for Anesthetic Order Entry): Lidocaine applied to wound bed Edema Control - Lymphedema / Segmental Compressive Device / Other: Tubigrip double layer applied - size D; size E today d/t stock availability Elevate, Exercise Daily and Avoid Standing for Long Periods of Time. Elevate legs to the level of the heart and pump ankles as often as possible Elevate leg(s) parallel to the floor when sitting. Off-Loading: Open toe surgical shoe - right and left foot WOUND #1: - T Second Wound Laterality: Right oe Cleanser: Soap and Water 1 x Per Day/30 Days Discharge Instructions: Gently cleanse wound with antibacterial soap, rinse and pat dry prior to dressing wounds Prim Dressing: Hydrofera Blue Ready Transfer Foam, 2.5x2.5 (in/in) 1 x Per Day/30 Days ary Discharge Instructions: Apply Hydrofera Blue Ready to wound bed as directed Secondary Dressing: Coverlet Latex-Free Fabric  Adhesive Dressings 1 x Per Day/30 Days Discharge Instructions: 1.5 x 2 WOUND #4: - T Third Wound Laterality: Left oe Cleanser: Soap and Water 1 x Per Day/30 Days Discharge Instructions: Gently cleanse wound with antibacterial soap, rinse and pat dry prior to dressing wounds Prim Dressing: Hydrofera Blue Ready Transfer Foam, 2.5x2.5 (in/in) 1 x Per Day/30 Days ary Discharge Instructions: Apply Hydrofera Blue Ready to wound bed as directed Secondary Dressing: Coverlet Latex-Free Fabric Adhesive Dressings 1 x Per Day/30 Days Discharge Instructions: 1.5 x 2 1. Based on what I am seeing we have struggled to get the wounds closed and at this point I do believe that surgical intervention is definitely warranted. We discussed this previous and the patient has been in agreement he did see Dr. Logan Bores and Dr. Logan Bores feels like that there is an option that he can undertake to try to get this closed. I am extremely pleased for his help in this matter. Lawrence [name], Lawrence [name] ([id number]) [id number] Page 8 of 8 2. I am going to recommend for now we continue with the [facility] followed by the coverlet to secure in place which is doing quite well. We will see patient back for reevaluation in 1 week here in the clinic. If anything worsens or changes patient will contact our office for additional recommendations. Electronic Signature(s) Signed: 06/19/2023 1:50:32 PM By: [name] Entered By: [name] on 06/19/2023 10:50:32 -------------------------------------------------------------------------------- SuperBill Details Patient Name: Date of Service: Lawrence [name], Lawrence RLA ND W. 06/19/2023 Medical Record Number: [id number] Patient Account Number: [id number] Date of Birth/Sex: Treating RN: [birth year] (72 y.o. Lawrence [name] Primary Care Provider: [name] Other Clinician: Betha Loa Referring Provider: Treating Provider/Extender: [name] in  Treatment: 20 Diagnosis Coding ICD-10 Codes Code Description [phone] Chronic multifocal osteomyelitis, right ankle and foot E10.621 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 L97.522 Non-pressure chronic ulcer of other part of left foot with fat layer exposed L97.512 Non-pressure chronic ulcer  of other part of right foot with fat layer exposed I25.10 Atherosclerotic heart disease of native coronary artery without angina pectoris I10 Essential (primary) hypertension I73.89 Other specified peripheral vascular diseases N18.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, stage 3a Facility Procedures : CPT4 Code: 16109604 Description: 99213 - WOUND CARE VISIT-LEV 3 EST PT Modifier: Quantity: 1 Physician Procedures : CPT4 Code Description Modifier [id number] 99213 - WC PHYS LEVEL 3 - EST PT ICD-10 Diagnosis Description M86.371 Chronic multifocal osteomyelitis, right ankle and foot E10.621 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 L97.522 Non-pressure chronic ulcer of  other part of left foot with fat layer exposed L97.512 Non-pressure chronic ulcer of other part of right foot with fat layer exposed Quantity: 1 Electronic Signature(s) Signed: 06/19/2023 2:03:07 PM By: [name] Entered By: [name] on 06/19/2023 11:03:06

## 2023-06-19 NOTE — H&P (View-Only) (Signed)
 No chief complaint on file.   Subjective:  Patient presents today for follow-up evaluation of ulcers to the bilateral toes.  He has been treated and managed at the [facility] wound care center.  Unfortunately he now has exposed bone to the right second digit with a nonhealing nature of the wound.  The wound care center referred him back to me to discuss possible surgical amputation of the toes.  Past Medical History:  Diagnosis Date   Allergies    Bronchitis    PMH   CAD (coronary artery disease)    07/2007   Diabetes (HCC)    Elevated lipids    Glaucoma    HBP (high blood pressure)    Headache    Myocardial infarction (HCC)    Neuropathy    Osteoarthritis    PAD (peripheral artery disease) (HCC)    a. 03/2022 Lower Ext Angio: No signif Ao-iliac dzs. R PT diff dzs and occluded above ankle. R Pedal arch intact w/ excellent antegrade flow provided by R AT-->no indication for revasc.   Skin cancer    Skin ulcer of right great toe, unspecified ulcer stage (HCC)    Sleep apnea    CPAP   Wears glasses    Wears partial dentures     Past Surgical History:  Procedure Laterality Date   ABDOMINAL AORTOGRAM W/LOWER EXTREMITY N/A 03/26/2022   Procedure: ABDOMINAL AORTOGRAM W/LOWER EXTREMITY;  Surgeon: Iran Ouch, MD;  Location: [facility] INVASIVE CV LAB;  Service: Cardiovascular;  Laterality: N/A;   APPENDECTOMY     CARDIAC CATHETERIZATION N/A 06/11/2016   Procedure: Left Heart Cath and Coronary Angiography;  Surgeon: Lamar Blinks, MD;  Location: [facility] INVASIVE CV LAB;  Service: Cardiovascular;  Laterality: N/A;   CARPAL TUNNEL RELEASE Left 04/24/2016   Procedure: CARPAL TUNNEL RELEASE;  Surgeon: Kennedy Bucker, MD;  Location: [facility] ORS;  Service: Orthopedics;  Laterality: Left;   CATARACT EXTRACTION W/ INTRAOCULAR LENS  IMPLANT, BILATERAL Bilateral    CATARACT EXTRACTION, BILATERAL     R eye 07/16/12, L eye 08/13/12 - with lens implant in both eyes   CORONARY ARTERY BYPASS GRAFT N/A 06/27/2016    Procedure: CORONARY ARTERY BYPASS GRAFTING times four using left internal mammary artery and right leg saphenous vein;  Surgeon: Kerin Perna, MD;  Location: [facility] OR;  Service: Open Heart Surgery;  Laterality: N/A;   EYE SURGERY Bilateral    JOINT REPLACEMENT     KNEE ARTHROPLASTY Left 02/11/2016   Procedure: COMPUTER ASSISTED TOTAL KNEE ARTHROPLASTY;  Surgeon: Donato Heinz, MD;  Location: [facility] ORS;  Service: Orthopedics;  Laterality: Left;   KNEE ARTHROSCOPY Right    LEFT HEART CATH AND CORS/GRAFTS ANGIOGRAPHY Left 01/13/2017   Procedure: Left Heart Cath and Cors/Grafts Angiography;  Surgeon: Lamar Blinks, MD;  Location: [facility] INVASIVE CV LAB;  Service: Cardiovascular;  Laterality: Left;   LEFT HEART CATH AND CORS/GRAFTS ANGIOGRAPHY Left 06/09/2023   Procedure: LEFT HEART CATH AND CORS/GRAFTS ANGIOGRAPHY;  Surgeon: Marcina Millard, MD;  Location: [facility] INVASIVE CV LAB;  Service: Cardiovascular;  Laterality: Left;   METATARSAL HEAD EXCISION Right 03/21/2020   Procedure: METATARSAL HEAD RESECTION RIGHT AND DEBRIDEMENT OF ULCER;  Surgeon: Felecia Shelling, DPM;  Location: [facility] OR;  Service: Podiatry;  Laterality: Right;   MULTIPLE TOOTH EXTRACTIONS     TEE WITHOUT CARDIOVERSION N/A 06/27/2016   Procedure: TRANSESOPHAGEAL ECHOCARDIOGRAM (TEE);  Surgeon: Kerin Perna, MD;  Location: [facility] OR;  Service: Open Heart Surgery;  Laterality: N/A;  Allergies  Allergen Reactions   Nitroglycerin Nausea Only and Other (See Comments)    Patches only - headache    Statins Other (See Comments)    BODY PAIN, Pt taking Crestor*      Fluvastatin Other (See Comments)    Other reaction(s): Muscle pain   Fluvastatin Sodium     Other reaction(s): Myalgias   Rosuvastatin Other (See Comments)    Other reaction(s): Muscle pain, Myalgias   Simvastatin Other (See Comments)    Other reaction(s): Muscle pain, Myalgias   Ezetimibe Other (See Comments)    Other reaction(s): Muscle Pain   Lipitor  [Atorvastatin] Other (See Comments) and Rash    BODY PAIN   Lisinopril Other (See Comments) and Nausea Only    Light headed, dizzy Other reaction(s): Dizziness    LT foot 12/30/2022  RT foot 12/30/2022   LT foot 01/13/2023  RT foot 01/13/2023   RT foot 06/19/2023  LT foot 06/19/2023   Objective/Physical Exam Vascular status intact.  Chronic edema noted bilateral lower extremities  Unfortunately there is exposed bone to the ulcer site of the right second toe.  Good proximal phalanx is exposed.  No purulence or erythema concerning for acute infection.  Ulcer also to the left third toe which extends into deeper tissue adjacent to the bone.  Fibrotic wound base.  Again, no indication of acute infection.  Radiographic Exam RT foot 12/30/2022:  Absence of the distal middle phalanx of the right second toe noted.  Prior amputations of the great toe also.  No osseous erosions or cortical irregularities that would be concerning for osteomyelitis are noted to the second digit of the right foot and third digit of the left specifically  Assessment: 1. s/p partial toe amputation right second. DOS: 10/28/2022 2.  Ulcers lesser digits bilateral 3.  Exposed bone right second digit  -Patient evaluated.   -Continue open toed postsurgical shoes -Medically necessary excisional debridement including subcutaneous tissue was performed today using a tissue nipper.  Excisional debridement of the necrotic nonviable tissue down to healthier bleeding viable tissue was performed with postdebridement measurement same as pre- -patient has been treated at the [facility] wound care center and their care has been exceptional.  Unfortunately there is exposed bone to the right second digit with evidence of nonhealing.  I do believe amputation of the toes is appropriate at this time.  Also partial toe amputation to the left third toe for primary closure of the ulcer site.  This was discussed and the patient agrees would like  to proceed.  Risk benefits advantages and disadvantages of the procedure were explained.  No guarantees were expressed or implied.  All patient questions were answered. -Authorization for surgery was initiated today.  Surgery will consist of right second toe amputation of the proximal phalanx.  Left third toe partial amputation.  Requested to be at [facility] -Return to clinic 1 week postop   Felecia Shelling, DPM [facility]  Dr. Felecia Shelling, DPM    [address] [place], [place] [address]                Office [phone]  Fax [phone]

## 2023-06-19 NOTE — Progress Notes (Addendum)
Lawrence [name] ([id number]) [id number] Page 1 of 10 Visit Report for 06/19/2023 Arrival Information Details Patient Name: Date of Service: [name], [name] 06/19/2023 12:00 PM Medical Record Number: [id number] Patient Account Number: [id number] Date of Birth/Sex: Treating RN: 02/21/1951 (72 y.o. Lawrence [name] Primary Care Emalia Witkop: [name] Other Clinician: Betha Loa Referring Kamron Portee: Treating Kawhi Diebold/Extender: [name] in Treatment: 20 Visit Information History Since Last Visit All ordered tests and consults were completed: No Patient Arrived: Lawrence [name] Added or deleted any medications: No Arrival Time: 12:40 Any new allergies or adverse reactions: No Transfer Assistance: None Had a fall or experienced change in No Patient Identification Verified: Yes activities of daily living that may affect Secondary Verification Process Completed: Yes risk of falls: Patient Requires Transmission-Based Precautions: No Signs or symptoms of abuse/neglect since last visito No Patient Has Alerts: No Hospitalized since last visit: No Implantable device outside of the clinic excluding No cellular tissue based products placed in the center since last visit: Has Dressing in Place as Prescribed: Yes Has Compression in Place as Prescribed: Yes Pain Present Now: No Electronic Signature(s) Signed: 06/22/2023 4:40:06 PM By: Betha Loa Entered By: Betha Loa on 06/19/2023 12:46:53 -------------------------------------------------------------------------------- Clinic Level of Care Assessment Details Patient Name: Date of Service: [name], [name] 06/19/2023 12:00 PM Medical Record Number: [id number] Patient Account Number: [id number] Date of Birth/Sex: Treating RN: Oct 11, 1951 (72 y.o. Lawrence [name] Primary Care Mozelle Remlinger: [name] Other Clinician: Betha Loa Referring Kimbly Eanes: Treating Elvenia Godden/Extender:  [name] in Treatment: 20 Clinic Level of Care Assessment Items TOOL 4 Quantity Score []  - 0 Use when only an EandM is performed on FOLLOW-UP visit ASSESSMENTS - Nursing Assessment / Reassessment X- 1 10 Reassessment of Co-morbidities (includes updates in patient status) Lawrence [name] ([id number]) [id number] Page 2 of 10 X- 1 5 Reassessment of Adherence to Treatment Plan ASSESSMENTS - Wound and Skin A ssessment / Reassessment []  - 0 Simple Wound Assessment / Reassessment - one wound X- 2 5 Complex Wound Assessment / Reassessment - multiple wounds []  - 0 Dermatologic / Skin Assessment (not related to wound area) ASSESSMENTS - Focused Assessment X- 1 5 Circumferential Edema Measurements - multi extremities []  - 0 Nutritional Assessment / Counseling / Intervention []  - 0 Lower Extremity Assessment (monofilament, tuning fork, pulses) []  - 0 Peripheral Arterial Disease Assessment (using hand held doppler) ASSESSMENTS - Ostomy and/or Continence Assessment and Care []  - 0 Incontinence Assessment and Management []  - 0 Ostomy Care Assessment and Management (repouching, etc.) PROCESS - Coordination of Care X - Simple Patient / Family Education for ongoing care 1 15 []  - 0 Complex (extensive) Patient / Family Education for ongoing care []  - 0 Staff obtains Chiropractor, Records, T Results / Process Orders est []  - 0 Staff telephones HHA, Nursing Homes / Clarify orders / etc []  - 0 Routine Transfer to another Facility (non-emergent condition) []  - 0 Routine Hospital Admission (non-emergent condition) []  - 0 New Admissions / Manufacturing engineer / Ordering NPWT Apligraf, etc. , []  - 0 Emergency Hospital Admission (emergent condition) X- 1 10 Simple Discharge Coordination []  - 0 Complex (extensive) Discharge Coordination PROCESS - Special Needs []  - 0 Pediatric / Minor Patient Management []  - 0 Isolation Patient  Management []  - 0 Hearing / Language / Visual special needs []  - 0 Assessment of Community assistance (transportation, D/C planning, etc.) []  - 0 Additional assistance / Altered mentation []  -  0 Support Surface(s) Assessment (bed, cushion, seat, etc.) INTERVENTIONS - Wound Cleansing / Measurement []  - 0 Simple Wound Cleansing - one wound X- 2 5 Complex Wound Cleansing - multiple wounds X- 1 5 Wound Imaging (photographs - any number of wounds) []  - 0 Wound Tracing (instead of photographs) []  - 0 Simple Wound Measurement - one wound X- 2 5 Complex Wound Measurement - multiple wounds INTERVENTIONS - Wound Dressings X - Small Wound Dressing one or multiple wounds 2 10 []  - 0 Medium Wound Dressing one or multiple wounds []  - 0 Large Wound Dressing one or multiple wounds []  - 0 Application of Medications - topical []  - 0 Application of Medications - injection INTERVENTIONS - Miscellaneous WWLLIAM, [name] ([id number]) [id number] Page 3 of 10 []  - 0 External ear exam []  - 0 Specimen Collection (cultures, biopsies, blood, body fluids, etc.) []  - 0 Specimen(s) / Culture(s) sent or taken to Lab for analysis []  - 0 Patient Transfer (multiple staff / Michiel Sites Lift / Similar devices) []  - 0 Simple Staple / Suture removal (25 or less) []  - 0 Complex Staple / Suture removal (26 or more) []  - 0 Hypo / Hyperglycemic Management (close monitor of Blood Glucose) []  - 0 Ankle / Brachial Index (ABI) - do not check if billed separately X- 1 5 Vital Signs Has the patient been seen at the hospital within the last three years: Yes Total Score: 105 Level Of Care: New/Established - Level 3 Electronic Signature(s) Signed: 06/22/2023 4:40:06 PM By: Betha Loa Entered By: Betha Loa on 06/19/2023 13:23:27 -------------------------------------------------------------------------------- Encounter Discharge Information Details Patient Name: Date of Service: A  [name], [name] 06/19/2023 12:00 PM Medical Record Number: [id number] Patient Account Number: [id number] Date of Birth/Sex: Treating RN: September 02, 1951 (72 y.o. Lawrence [name], Lawrence [name] Primary Care Klaira Pesci: [name] Other Clinician: Betha Loa Referring Keiandre Cygan: Treating Shiana Rappleye/Extender: [name] in Treatment: 20 Encounter Discharge Information Items Discharge Condition: Stable Ambulatory Status: Cane Discharge Destination: Home Transportation: Private Auto Accompanied By: wife Schedule Follow-up Appointment: Yes Clinical Summary of Care: Electronic Signature(s) Signed: 06/22/2023 4:40:06 PM By: Betha Loa Entered By: Betha Loa on 06/19/2023 13:34:59 Janowicz, [name] ([id number]) [id number] Page 4 of 10 -------------------------------------------------------------------------------- Lower Extremity Assessment Details Patient Name: Date of Service: [name], [name] 06/19/2023 12:00 PM Medical Record Number: [id number] Patient Account Number: [id number] Date of Birth/Sex: Treating RN: [birth year] (72 y.o. Lawrence [name] Primary Care Ankush Gintz: [name] Other Clinician: Betha Loa Referring Adrina Armijo: Treating Acel Natzke/Extender: [name] in Treatment: 20 Edema Assessment Assessed: [name]: Yes] Franne Forts: Yes] Edema: [Left: Yes] [Right: Yes] Calf Left: Right: Point of Measurement: 35 cm From Medial Instep 43 cm 42 cm Ankle Left: Right: Point of Measurement: 10 cm From Medial Instep 28.5 cm 30 cm Vascular Assessment Pulses: Dorsalis Pedis Palpable: [Left:Yes] [Right:Yes] Electronic Signature(s) Signed: 06/22/2023 4:40:06 PM By: Betha Loa Signed: 07/01/2023 5:37:35 PM By: Elliot Gurney, BSN, RN, CWS, [name] RN, BSN Entered By: Betha Loa on 06/19/2023 12:59:19 -------------------------------------------------------------------------------- Multi Wound Chart Details Patient Name:  Date of Service: [name], [name] 06/19/2023 12:00 PM Medical Record Number: [id number] Patient Account Number: [id number] Date of Birth/Sex: Treating RN: [birth year] (72 y.o. Lawrence [name] Primary Care Jahira Swiss: [name] Other Clinician: Betha Loa Referring Servando Kyllonen: Treating Briunna Leicht/Extender: [name] in Treatment: 20 Vital Signs Height(in): 70 Pulse(bpm): 76 Weight(lbs): 302 Blood Pressure(mmHg): 143/106 Body Mass Index(BMI): 43.3 Temperature(F): 97.8 Respiratory Rate(breaths/min):  18 [1:Photos:] [N/A:N/A [id number] Page 5 of 10] Right T Second oe Left T Third oe N/A Wound Location: Gradually Appeared Gradually Appeared N/A Wounding Event: Diabetic Wound/Ulcer of the Lower Diabetic Wound/Ulcer of the Lower N/A Primary Etiology: Extremity Extremity Glaucoma, Coronary Artery Disease, Glaucoma, Coronary Artery Disease, N/A Comorbid History: Hypertension, Myocardial Infarction, Hypertension, Myocardial Infarction, Type II Diabetes, End Stage Renal Type II Diabetes, End Stage Renal Disease Disease 11/03/2022 12/05/2022 N/A Date Acquired: 20 20 N/A Weeks of Treatment: Open Open N/A Wound Status: No No N/A Wound Recurrence: Yes Yes N/A Pending A mputation on Presentation: 1x1.1x0.4 1x1x0.2 N/A Measurements L x W x D (cm) 0.864 0.785 N/A A (cm) : rea 0.346 0.157 N/A Volume (cm) : -10.10% 58.40% N/A % Reduction in A rea: -46.60% 58.40% N/A % Reduction in Volume: Grade 3 Grade 3 N/A Classification: Medium Medium N/A Exudate A mount: Serosanguineous Serosanguineous N/A Exudate Type: red, brown red, brown N/A Exudate Color: Distinct, outline attached Distinct, outline attached N/A Wound Margin: Large (67-100%) None Present (0%) N/A Granulation A mount: Pink N/A N/A Granulation Quality: Small (1-33%) Large (67-100%) N/A Necrotic A mount: Fat Layer (Subcutaneous Tissue): Yes Fat Layer  (Subcutaneous Tissue): Yes N/A Exposed Structures: Fascia: No Fascia: No Tendon: No Tendon: No Muscle: No Muscle: No Joint: No Joint: No Bone: No Bone: No None None N/A Epithelialization: Treatment Notes Electronic Signature(s) Signed: 06/22/2023 4:40:06 PM By: Betha Loa Entered By: Betha Loa on 06/19/2023 12:59:29 -------------------------------------------------------------------------------- Multi-Disciplinary Care Plan Details Patient Name: Date of Service: [name], [name] 06/19/2023 12:00 PM Medical Record Number: [id number] Patient Account Number: [id number] Date of Birth/Sex: Treating RN: Mar 22, 1951 (72 y.o. Lawrence [name] Primary Care Kahdijah Errickson: [name] Other Clinician: Betha Loa Referring Samaad Hashem: Treating Tytan Sandate/Extender: [name] in Treatment: 20 Active Inactive Electronic Signature(s) Signed: 06/22/2023 4:40:06 PM By: Betha Loa Signed: 07/01/2023 5:37:35 PM By: Elliot Gurney BSN, RN, CWS, [name] RN, BSN Entered By: Betha Loa on 06/19/2023 13:24:23 Lawrence [name] ([id number]) [id number] Page 6 of 10 -------------------------------------------------------------------------------- Pain Assessment Details Patient Name: Date of Service: [name], [name] 06/19/2023 12:00 PM Medical Record Number: [id number] Patient Account Number: [id number] Date of Birth/Sex: Treating RN: 1951-06-29 (72 y.o. Lawrence [name] Primary Care Salah Burlison: [name] Other Clinician: Betha Loa Referring Yuriel Lopezmartinez: Treating Maryclare Nydam/Extender: [name] in Treatment: 20 Active Problems Location of Pain Severity and Description of Pain Patient Has Paino No Site Locations Pain Management and Medication Current Pain Management: Electronic Signature(s) Signed: 06/22/2023 4:40:06 PM By: Betha Loa Signed: 07/01/2023 5:37:35 PM By: Elliot Gurney, BSN, RN, CWS, [name] RN, BSN Entered  By: Betha Loa on 06/19/2023 12:49:48 -------------------------------------------------------------------------------- Patient/Caregiver Education Details Patient Name: Date of Service: [name] MS, [name] 8/16/2024andnbsp12:00 PM Medical Record Number: [id number] Patient Account Number: [id number] Date of Birth/Gender: Treating RN: [birth year] (72 y.o. Lawrence [name] Primary Care Physician: [name] Other Clinician: Betha Loa Referring Physician: Treating Physician/Extender: [name] in Treatment: [address] [name] ([id number]) [phone].pdf Page 7 of 10 Education Assessment Education Provided To: Patient Education Topics Provided Wound/Skin Impairment: Handouts: Other: continue wound care as directed Methods: Explain/Verbal Responses: State content correctly Electronic Signature(s) Signed: 06/22/2023 4:40:06 PM By: Betha Loa Entered By: Betha Loa on 06/19/2023 13:24:41 -------------------------------------------------------------------------------- Wound Assessment Details Patient Name: Date of Service: [name], [name] 06/19/2023 12:00 PM Medical Record Number: [id number] Patient Account Number: [id number] Date of Birth/Sex: Treating RN: [birth year] (  72 y.o. Lawrence [name], Lawrence [name] Primary Care Azyiah Bo: [name] Other Clinician: Betha Loa Referring Sriman Tally: Treating Dayonna Selbe/Extender: [name] in Treatment: 20 Wound Status Wound Number: 1 Primary Diabetic Wound/Ulcer of the Lower Extremity Etiology: Wound Location: Right T Second oe Wound Open Wounding Event: Gradually Appeared Status: Date Acquired: 11/03/2022 Comorbid Glaucoma, Coronary Artery Disease, Hypertension, Myocardial Weeks Of Treatment: 20 History: Infarction, Type II Diabetes, End Stage Renal Disease Clustered Wound: No Pending Amputation On Presentation Photos Wound Measurements Length: (cm)  1 Width: (cm) 1.1 Depth: (cm) 0.4 Area: (cm) 0.864 Volume: (cm) 0.346 % Reduction in Area: -10.1% % Reduction in Volume: -46.6% Epithelialization: None Wound Description Classification: Grade 3 Wound Margin: Distinct, outline attached Lawrence [name] ([id number]) Exudate Amount: Medium Exudate Type: Serosanguineous Exudate Color: red, brown Foul Odor After Cleansing: No Slough/Fibrino Yes [id number] Page 8 of 10 Wound Bed Granulation Amount: Large (67-100%) Exposed Structure Granulation Quality: Pink Fascia Exposed: No Necrotic Amount: Small (1-33%) Fat Layer (Subcutaneous Tissue) Exposed: Yes Tendon Exposed: No Muscle Exposed: No Joint Exposed: No Bone Exposed: No Treatment Notes Wound #1 (Toe Second) Wound Laterality: Right Cleanser Soap and Water Discharge Instruction: Gently cleanse wound with antibacterial soap, rinse and pat dry prior to dressing wounds Peri-Wound Care Topical Primary Dressing Hydrofera Blue Ready Transfer Foam, 2.5x2.5 (in/in) Discharge Instruction: Apply Hydrofera Blue Ready to wound bed as directed Secondary Dressing Coverlet Latex-Free Fabric Adhesive Dressings Discharge Instruction: 1.5 x 2 Secured With Compression Wrap Compression Stockings Add-Ons Electronic Signature(s) Signed: 06/22/2023 4:40:06 PM By: Betha Loa Signed: 07/01/2023 5:37:35 PM By: Elliot Gurney, BSN, RN, CWS, [name] RN, BSN Entered By: Betha Loa on 06/19/2023 12:57:10 -------------------------------------------------------------------------------- Wound Assessment Details Patient Name: Date of Service: [name], [name] 06/19/2023 12:00 PM Medical Record Number: [id number] Patient Account Number: [id number] Date of Birth/Sex: Treating RN: 1951/01/08 (72 y.o. Lawrence [name] Primary Care Sanjuan Sawa: [name] Other Clinician: Betha Loa Referring Renny Remer: Treating Tykira Wachs/Extender: [name] in  Treatment: 20 Wound Status Wound Number: 4 Primary Diabetic Wound/Ulcer of the Lower Extremity Etiology: Wound Location: Left T Third oe Wound Open Wounding Event: Gradually Appeared Status: Date Acquired: 12/05/2022 Comorbid Glaucoma, Coronary Artery Disease, Hypertension, Myocardial Weeks Of Treatment: 20 History: Infarction, Type II Diabetes, End Stage Renal Disease Clustered Wound: No Pending Amputation On Presentation MYTHIAS, KHALID ([id number]) [id number] Page 9 of 10 Photos Wound Measurements Length: (cm) 1 Width: (cm) 1 Depth: (cm) 0.2 Area: (cm) 0.785 Volume: (cm) 0.157 % Reduction in Area: 58.4% % Reduction in Volume: 58.4% Epithelialization: None Wound Description Classification: Grade 3 Wound Margin: Distinct, outline attached Exudate Amount: Medium Exudate Type: Serosanguineous Exudate Color: red, brown Foul Odor After Cleansing: No Slough/Fibrino Yes Wound Bed Granulation Amount: None Present (0%) Exposed Structure Necrotic Amount: Large (67-100%) Fascia Exposed: No Necrotic Quality: Adherent Slough Fat Layer (Subcutaneous Tissue) Exposed: Yes Tendon Exposed: No Muscle Exposed: No Joint Exposed: No Bone Exposed: No Treatment Notes Wound #4 (Toe Third) Wound Laterality: Left Cleanser Soap and Water Discharge Instruction: Gently cleanse wound with antibacterial soap, rinse and pat dry prior to dressing wounds Peri-Wound Care Topical Primary Dressing Hydrofera Blue Ready Transfer Foam, 2.5x2.5 (in/in) Discharge Instruction: Apply Hydrofera Blue Ready to wound bed as directed Secondary Dressing Coverlet Latex-Free Fabric Adhesive Dressings Discharge Instruction: 1.5 x 2 Secured With Compression Wrap Compression Stockings Add-Ons Electronic Signature(s) Signed: 06/22/2023 4:40:06 PM By: Betha Loa Signed: 07/01/2023 5:37:35 PM By: Elliot Gurney, BSN, RN, CWS, [name] RN, BSN Entered By: Betha Loa  on 06/19/2023  12:57:39 Cheslock, [name] ([id number]) [id number] Page 10 of 10 -------------------------------------------------------------------------------- Vitals Details Patient Name: Date of Service: [name], [name] 06/19/2023 12:00 PM Medical Record Number: [id number] Patient Account Number: [id number] Date of Birth/Sex: Treating RN: 1951/06/17 (72 y.o. Lawrence [name], Lawrence [name] Primary Care Taseen Marasigan: [name] Other Clinician: Betha Loa Referring Marguerite Jarboe: Treating Grete Bosko/Extender: [name] in Treatment: 20 Vital Signs Time Taken: 12:47 Temperature (F): 97.8 Height (in): 70 Pulse (bpm): 76 Weight (lbs): 302 Respiratory Rate (breaths/min): 18 Body Mass Index (BMI): 43.3 Blood Pressure (mmHg): 143/106 Reference Range: 80 - 120 mg / dl Electronic Signature(s) Signed: 06/22/2023 4:40:06 PM By: Betha Loa Entered By: Betha Loa on 06/19/2023 12:49:44

## 2023-06-25 ENCOUNTER — Other Ambulatory Visit: Payer: Self-pay

## 2023-06-25 ENCOUNTER — Encounter
Admission: RE | Admit: 2023-06-25 | Discharge: 2023-06-25 | Disposition: A | Discharge status: Home / Self Care | Payer: Medicare Other | Source: Ambulatory Visit | Attending: Podiatry | Admitting: Podiatry

## 2023-06-25 DIAGNOSIS — E109 Type 1 diabetes mellitus without complications: Secondary | ICD-10-CM

## 2023-06-25 DIAGNOSIS — Z951 Presence of aortocoronary bypass graft: Secondary | ICD-10-CM

## 2023-06-25 DIAGNOSIS — I10 Essential (primary) hypertension: Secondary | ICD-10-CM

## 2023-06-25 HISTORY — DX: Angina pectoris, unspecified: I20.9

## 2023-06-25 HISTORY — DX: Carrier or suspected carrier of methicillin resistant Staphylococcus aureus: Z22.322

## 2023-06-25 HISTORY — DX: Gastro-esophageal reflux disease without esophagitis: K21.9

## 2023-06-25 HISTORY — DX: Chronic kidney disease, unspecified: N18.9

## 2023-06-25 HISTORY — DX: Dyspnea, unspecified: R06.00

## 2023-06-25 NOTE — Patient Instructions (Addendum)
Your procedure is scheduled on: 06/26/23 - Friday Report to the Registration Desk on the 1st floor of the [facility]. To find out your arrival time, please call [phone] between 1PM - 3PM on: 06/25/23 - Thursday If your arrival time is 6:00 am, do not arrive before that time as the [facility] entrance doors do not open until 6:00 am.  REMEMBER: Instructions that are not followed completely may result in serious medical risk, up to and including death; or upon the discretion of your surgeon and anesthesiologist your surgery may need to be rescheduled.  Do not eat food after midnight the night before surgery.  No gum chewing or hard candies.  You may drink water up to 2 hours before you are scheduled to arrive for your surgery. Do not drink anything within 2 hours of your scheduled arrival time.  One week prior to surgery: Stop Anti-inflammatories (NSAIDS) such as Advil, Aleve, Ibuprofen, Motrin, Naproxen, Naprosyn and Aspirin based products such as Excedrin, Goody's Powder, BC Powder.  Stop ANY OVER THE COUNTER supplements until after surgery.  You may however, continue to take Tylenol if needed for pain up until the day of surgery.   TAKE ONLY THESE MEDICATIONS THE MORNING OF SURGERY WITH A SIP OF WATER:  famotidine (PEPCID) - (take one the night before and one on the morning of surgery - helps to prevent nausea after surgery.) 2.  Use albuterol (VENTOLIN HFA)  on the day of surgery and bring to the hospital. 3.   isosorbide mononitrate (IMDUR)   No Alcohol for 24 hours before or after surgery.  No Smoking including e-cigarettes for 24 hours before surgery.  No chewable tobacco products for at least 6 hours before surgery.  No nicotine patches on the day of surgery.  Do not use any "recreational" drugs for at least a week (preferably 2 weeks) before your surgery.  Please be advised that the combination of cocaine and anesthesia may have negative outcomes, up to and  including death. If you test positive for cocaine, your surgery will be cancelled.  On the morning of surgery brush your teeth with toothpaste and water, you may rinse your mouth with mouthwash if you wish. Do not swallow any toothpaste or mouthwash.  Do not wear jewelry, make-up, hairpins, clips or nail polish.  Do not wear lotions, powders, or perfumes.   Do not shave body hair from the neck down 48 hours before surgery.  Contact lenses, hearing aids and dentures may not be worn into surgery.  Do not bring valuables to the hospital. [facility] is not responsible for any missing/lost belongings or valuables.   Bring your C-PAP to the hospital in case you may have to spend the night.   Notify your doctor if there is any change in your medical condition (cold, fever, infection).  Wear comfortable clothing (specific to your surgery type) to the hospital.  After surgery, you can help prevent lung complications by doing breathing exercises.  Take deep breaths and cough every 1-2 hours. Your doctor may order a device called an Incentive Spirometer to help you take deep breaths. When coughing or sneezing, hold a pillow firmly against your incision with both hands. This is called "splinting." Doing this helps protect your incision. It also decreases belly discomfort.  If you are being admitted to the hospital overnight, leave your suitcase in the car. After surgery it may be brought to your room.  In case of increased patient census, it may be  necessary for you, the patient, to continue your postoperative care in the Same Day Surgery department.  If you are being discharged the day of surgery, you will not be allowed to drive home. You will need a responsible individual to drive you home and stay with you for 24 hours after surgery.   If you are taking public transportation, you will need to have a responsible individual with you.  Please call the Pre-admissions Testing Dept. at [phone] if you have any questions about these instructions.  Surgery Visitation Policy:  Patients having surgery or a procedure may have two visitors.  Children under the age of 52 must have an adult with them who is not the patient.  Inpatient Visitation:    Visiting hours are 7 a.m. to 8 p.m. Up to four visitors are allowed at one time in a patient room. The visitors may rotate out with other people during the day.  One visitor age 52 or older may stay with the patient overnight and must be in the room by 8 p.m.

## 2023-06-26 ENCOUNTER — Other Ambulatory Visit: Payer: Self-pay

## 2023-06-26 ENCOUNTER — Ambulatory Visit
Admission: RE | Admit: 2023-06-26 | Discharge: 2023-06-26 | Disposition: A | Discharge status: Home / Self Care | Payer: Medicare Other | Attending: Podiatry | Admitting: Podiatry

## 2023-06-26 ENCOUNTER — Encounter: Payer: Self-pay | Admitting: Podiatry

## 2023-06-26 ENCOUNTER — Encounter
Admission: RE | Disposition: A | Discharge status: Home / Self Care | Payer: Self-pay | Source: Home / Self Care | Attending: Podiatry

## 2023-06-26 ENCOUNTER — Ambulatory Visit: Payer: Medicare Other | Admitting: Certified Registered"

## 2023-06-26 ENCOUNTER — Ambulatory Visit: Payer: Medicare Other | Admitting: Urgent Care

## 2023-06-26 DIAGNOSIS — L97512 Non-pressure chronic ulcer of other part of right foot with fat layer exposed: Secondary | ICD-10-CM | POA: Diagnosis not present

## 2023-06-26 DIAGNOSIS — G4730 Sleep apnea, unspecified: Secondary | ICD-10-CM | POA: Insufficient documentation

## 2023-06-26 DIAGNOSIS — I252 Old myocardial infarction: Secondary | ICD-10-CM | POA: Insufficient documentation

## 2023-06-26 DIAGNOSIS — I10 Essential (primary) hypertension: Secondary | ICD-10-CM | POA: Insufficient documentation

## 2023-06-26 DIAGNOSIS — I25119 Atherosclerotic heart disease of native coronary artery with unspecified angina pectoris: Secondary | ICD-10-CM | POA: Diagnosis not present

## 2023-06-26 DIAGNOSIS — M869 Osteomyelitis, unspecified: Secondary | ICD-10-CM | POA: Diagnosis present

## 2023-06-26 DIAGNOSIS — E1169 Type 2 diabetes mellitus with other specified complication: Secondary | ICD-10-CM | POA: Diagnosis not present

## 2023-06-26 DIAGNOSIS — L97522 Non-pressure chronic ulcer of other part of left foot with fat layer exposed: Secondary | ICD-10-CM | POA: Diagnosis not present

## 2023-06-26 DIAGNOSIS — E1151 Type 2 diabetes mellitus with diabetic peripheral angiopathy without gangrene: Secondary | ICD-10-CM | POA: Diagnosis not present

## 2023-06-26 DIAGNOSIS — Z951 Presence of aortocoronary bypass graft: Secondary | ICD-10-CM

## 2023-06-26 DIAGNOSIS — E109 Type 1 diabetes mellitus without complications: Secondary | ICD-10-CM

## 2023-06-26 HISTORY — PX: AMPUTATION TOE: SHX6595

## 2023-06-26 LAB — POCT I-STAT, CHEM 8
BUN: 17 mg/dL (ref 8–23)
Calcium, Ion: 1.06 mmol/L — ABNORMAL LOW (ref 1.15–1.40)
Chloride: 102 mmol/L (ref 98–111)
Creatinine, Ser: 1.1 mg/dL (ref 0.61–1.24)
Glucose, Bld: 160 mg/dL — ABNORMAL HIGH (ref 70–99)
HCT: 34 % — ABNORMAL LOW (ref 39.0–52.0)
Hemoglobin: 11.6 g/dL — ABNORMAL LOW (ref 13.0–17.0)
Potassium: 3.6 mmol/L (ref 3.5–5.1)
Sodium: 139 mmol/L (ref 135–145)
TCO2: 28 mmol/L (ref 22–32)

## 2023-06-26 LAB — GLUCOSE, CAPILLARY
Glucose-Capillary: 152 mg/dL — ABNORMAL HIGH (ref 70–99)
Glucose-Capillary: 147 mg/dL — ABNORMAL HIGH (ref 70–99)

## 2023-06-26 SURGERY — AMPUTATION, TOE
Anesthesia: Monitor Anesthesia Care | Site: Toe | Laterality: Bilateral

## 2023-06-26 MED ORDER — PHENYLEPHRINE HCL-NACL 20-0.9 MG/250ML-% IV SOLN
INTRAVENOUS | Status: AC
  Filled 2023-06-26: qty 250

## 2023-06-26 MED ORDER — BUPIVACAINE HCL 0.5 % IJ SOLN
INTRAMUSCULAR | Status: DC | PRN
  Administered 2023-06-26: 10 mL

## 2023-06-26 MED ORDER — CHLORHEXIDINE GLUCONATE 0.12 % MT SOLN
15.0000 mL | Freq: Once | OROMUCOSAL | Status: AC
  Administered 2023-06-26: 15 mL via OROMUCOSAL

## 2023-06-26 MED ORDER — ORAL CARE MOUTH RINSE: 15.0000 mL | Freq: Once | OROMUCOSAL | Status: AC

## 2023-06-26 MED ORDER — FENTANYL CITRATE (PF) 100 MCG/2ML IJ SOLN: 25.0000 ug | INTRAMUSCULAR | Status: DC | PRN

## 2023-06-26 MED ORDER — CEFAZOLIN SODIUM-DEXTROSE 2-4 GM/100ML-% IV SOLN
2.0000 g | INTRAVENOUS | Status: AC
  Administered 2023-06-26: 2 g via INTRAVENOUS

## 2023-06-26 MED ORDER — ACETAMINOPHEN 10 MG/ML IV SOLN: 1000.0000 mg | Freq: Once | INTRAVENOUS | Status: DC | PRN

## 2023-06-26 MED ORDER — PHENYLEPHRINE 80 MCG/ML (10ML) SYRINGE FOR IV PUSH (FOR BLOOD PRESSURE SUPPORT)
PREFILLED_SYRINGE | INTRAVENOUS | Status: DC | PRN
  Administered 2023-06-26 (×2): 80 ug via INTRAVENOUS

## 2023-06-26 MED ORDER — PROPOFOL 10 MG/ML IV BOLUS
INTRAVENOUS | Status: AC
  Filled 2023-06-26: qty 20

## 2023-06-26 MED ORDER — PROPOFOL 1000 MG/100ML IV EMUL
INTRAVENOUS | Status: AC
  Filled 2023-06-26: qty 100

## 2023-06-26 MED ORDER — BUPIVACAINE HCL (PF) 0.5 % IJ SOLN
INTRAMUSCULAR | Status: AC
  Filled 2023-06-26: qty 30

## 2023-06-26 MED ORDER — OXYCODONE HCL 5 MG/5ML PO SOLN: 5.0000 mg | Freq: Once | ORAL | Status: DC | PRN

## 2023-06-26 MED ORDER — FENTANYL CITRATE (PF) 100 MCG/2ML IJ SOLN
INTRAMUSCULAR | Status: AC
  Filled 2023-06-26: qty 2

## 2023-06-26 MED ORDER — LIDOCAINE HCL (PF) 1 % IJ SOLN
INTRAMUSCULAR | Status: AC
  Filled 2023-06-26: qty 30

## 2023-06-26 MED ORDER — ONDANSETRON HCL 4 MG/2ML IJ SOLN
INTRAMUSCULAR | Status: DC | PRN
  Administered 2023-06-26: 4 mg via INTRAVENOUS

## 2023-06-26 MED ORDER — SODIUM CHLORIDE 0.9 % IV SOLN: INTRAVENOUS | Status: DC

## 2023-06-26 MED ORDER — PROMETHAZINE HCL 25 MG/ML IJ SOLN: 6.2500 mg | INTRAMUSCULAR | Status: DC | PRN

## 2023-06-26 MED ORDER — OXYCODONE HCL 5 MG PO TABS: 5.0000 mg | ORAL_TABLET | Freq: Once | ORAL | Status: DC | PRN

## 2023-06-26 MED ORDER — PROPOFOL 10 MG/ML IV BOLUS
INTRAVENOUS | Status: DC | PRN
  Administered 2023-06-26: 20 mg via INTRAVENOUS

## 2023-06-26 MED ORDER — EPHEDRINE SULFATE-NACL 50-0.9 MG/10ML-% IV SOSY
PREFILLED_SYRINGE | INTRAVENOUS | Status: DC | PRN
  Administered 2023-06-26: 10 mg via INTRAVENOUS

## 2023-06-26 MED ORDER — ONDANSETRON HCL 4 MG/2ML IJ SOLN
INTRAMUSCULAR | Status: AC
  Filled 2023-06-26: qty 2

## 2023-06-26 MED ORDER — FENTANYL CITRATE (PF) 100 MCG/2ML IJ SOLN
INTRAMUSCULAR | Status: DC | PRN
  Administered 2023-06-26: 25 ug via INTRAVENOUS

## 2023-06-26 MED ORDER — DROPERIDOL 2.5 MG/ML IJ SOLN: 0.6250 mg | Freq: Once | INTRAMUSCULAR | Status: DC | PRN

## 2023-06-26 MED ORDER — PHENYLEPHRINE HCL-NACL 20-0.9 MG/250ML-% IV SOLN
INTRAVENOUS | Status: DC | PRN
  Administered 2023-06-26: 30 ug/min via INTRAVENOUS

## 2023-06-26 MED ORDER — PROPOFOL 500 MG/50ML IV EMUL
INTRAVENOUS | Status: DC | PRN
  Administered 2023-06-26: 70 ug/kg/min via INTRAVENOUS

## 2023-06-26 MED ORDER — CEFAZOLIN SODIUM-DEXTROSE 2-4 GM/100ML-% IV SOLN
INTRAVENOUS | Status: AC
  Filled 2023-06-26: qty 100

## 2023-06-26 MED ORDER — LIDOCAINE HCL (PF) 1 % IJ SOLN
INTRAMUSCULAR | Status: DC | PRN
  Administered 2023-06-26: 10 mL

## 2023-06-26 MED ORDER — CHLORHEXIDINE GLUCONATE 0.12 % MT SOLN
OROMUCOSAL | Status: AC
  Filled 2023-06-26: qty 15

## 2023-06-26 SURGICAL SUPPLY — 49 items
BLADE MED AGGRESSIVE (BLADE) ×1 IMPLANT
BLADE OSC/SAGITTAL MD 5.5X18 (BLADE) IMPLANT
BLADE SURG 15 STRL LF DISP TIS (BLADE) IMPLANT
BLADE SURG 15 STRL SS (BLADE)
BLADE SURG MINI STRL (BLADE) IMPLANT
BNDG CMPR 5X4 KNIT ELC UNQ LF (GAUZE/BANDAGES/DRESSINGS) ×1
BNDG CMPR 75X21 PLY HI ABS (MISCELLANEOUS)
BNDG ELASTIC 4INX 5YD STR LF (GAUZE/BANDAGES/DRESSINGS) ×1 IMPLANT
BNDG ESMARCH 4 X 12 STRL LF (GAUZE/BANDAGES/DRESSINGS) ×1
BNDG ESMARCH 4X12 STRL LF (GAUZE/BANDAGES/DRESSINGS) ×1 IMPLANT
BNDG GAUZE DERMACEA FLUFF 4 (GAUZE/BANDAGES/DRESSINGS) ×1 IMPLANT
BNDG GZE DERMACEA 4 6PLY (GAUZE/BANDAGES/DRESSINGS) ×1
CNTNR URN SCR LID CUP LEK RST (MISCELLANEOUS) ×1 IMPLANT
CONT SPEC 4OZ STRL OR WHT (MISCELLANEOUS) ×1
CUFF TOURN SGL QUICK 12 (TOURNIQUET CUFF) IMPLANT
CUFF TOURN SGL QUICK 18X4 (TOURNIQUET CUFF) IMPLANT
DRAPE FLUOR MINI C-ARM 54X84 (DRAPES) IMPLANT
DURAPREP 26ML APPLICATOR (WOUND CARE) ×1 IMPLANT
ELECT REM PT RETURN 9FT ADLT (ELECTROSURGICAL) ×1
ELECTRODE REM PT RTRN 9FT ADLT (ELECTROSURGICAL) ×1 IMPLANT
GAUZE SPONGE 4X4 12PLY STRL (GAUZE/BANDAGES/DRESSINGS) ×2 IMPLANT
GAUZE STRETCH 2X75IN STRL (MISCELLANEOUS) IMPLANT
GAUZE XEROFORM 1X8 LF (GAUZE/BANDAGES/DRESSINGS) ×1 IMPLANT
GLOVE BIO SURGEON STRL SZ8 (GLOVE) ×1 IMPLANT
GLOVE BIOGEL PI IND STRL 8 (GLOVE) ×1 IMPLANT
GOWN STRL REUS W/ TWL LRG LVL3 (GOWN DISPOSABLE) ×2 IMPLANT
GOWN STRL REUS W/TWL LRG LVL3 (GOWN DISPOSABLE) ×2
HANDPIECE VERSAJET DEBRIDEMENT (MISCELLANEOUS) IMPLANT
IV NS IRRIG 3000ML ARTHROMATIC (IV SOLUTION) IMPLANT
KIT TURNOVER KIT A (KITS) ×1 IMPLANT
LABEL OR SOLS (LABEL) ×1 IMPLANT
MANIFOLD NEPTUNE II (INSTRUMENTS) ×1 IMPLANT
NDL FILTER BLUNT 18X1 1/2 (NEEDLE) ×1 IMPLANT
NDL HYPO 25X1 1.5 SAFETY (NEEDLE) ×2 IMPLANT
NEEDLE FILTER BLUNT 18X1 1/2 (NEEDLE) ×1
NEEDLE HYPO 25X1 1.5 SAFETY (NEEDLE) ×2
NS IRRIG 500ML POUR BTL (IV SOLUTION) ×1 IMPLANT
PACK EXTREMITY ARMC (MISCELLANEOUS) ×1 IMPLANT
SOL PREP PVP 2OZ (MISCELLANEOUS) ×1
SOLUTION PREP PVP 2OZ (MISCELLANEOUS) ×1 IMPLANT
STOCKINETTE STRL 6IN 960660 (GAUZE/BANDAGES/DRESSINGS) ×1 IMPLANT
SUT ETHILON 3-0 FS-10 30 BLK (SUTURE) ×2
SUT VIC AB 3-0 SH 27 (SUTURE) ×1
SUT VIC AB 3-0 SH 27X BRD (SUTURE) ×1 IMPLANT
SUTURE EHLN 3-0 FS-10 30 BLK (SUTURE) ×2 IMPLANT
SWAB CULTURE AMIES ANAERIB BLU (MISCELLANEOUS) IMPLANT
SYR 10ML LL (SYRINGE) ×1 IMPLANT
TRAP FLUID SMOKE EVACUATOR (MISCELLANEOUS) ×1 IMPLANT
WATER STERILE IRR 500ML POUR (IV SOLUTION) ×1 IMPLANT

## 2023-06-26 NOTE — Brief Op Note (Signed)
06/26/2023  4:23 PM  PATIENT:  Lawrence [name]  72 y.o. 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  PROCEDURE:  Procedure(s): AMPUTATION TOE MPJ OF THE JOINT,PARTIAL TOE AMPUTUATION (Bilateral)  SURGEON:  Surgeons and Role:    Felecia Shelling, DPM - Primary  PHYSICIAN ASSISTANT:   ASSISTANTS: none   ANESTHESIA:   local and MAC  EBL:  10mL   BLOOD ADMINISTERED:none  DRAINS: none   LOCAL MEDICATIONS USED:  MARCAINE   , LIDOCAINE , and Amount: 16 ml  SPECIMEN:  No Specimen  DISPOSITION OF SPECIMEN:  N/A  COUNTS:  YES  TOURNIQUET:   Total Tourniquet Time Documented: Ankle (Left) - 32 minutes Total: Ankle (Left) - 32 minutes  Ankle (Right) - 30 minutes Total: Ankle (Right) - 30 minutes   DICTATION: .Reubin Milan Dictation  PLAN OF CARE: Discharge to home after PACU  PATIENT DISPOSITION:  PACU - hemodynamically stable.   Delay start of Pharmacological VTE agent (>24hrs) due to surgical blood loss or risk of bleeding: no  Felecia Shelling, DPM [facility]  Dr. Felecia Shelling, DPM    [address] [place], [place] [address]                Office [phone]  Fax [phone]

## 2023-06-26 NOTE — Anesthesia Procedure Notes (Signed)
Procedure Name: MAC Date/Time: 06/26/2023 3:24 PM  Performed by: Nelle Don, CRNAPre-anesthesia Checklist: Patient identified, Emergency Drugs available, Suction available and Patient being monitored Oxygen Delivery Method: Simple face mask

## 2023-06-26 NOTE — Progress Notes (Addendum)
Dressing changed per Dr. Logan Bores order. Patient instructed to call MD for any additional bleeding once discharged.

## 2023-06-26 NOTE — Discharge Instructions (Addendum)
Leave dressings clean and dry.  Weightbearing as tolerated in surgical shoe bilateral.  Rest, elevation over the next 48 hours          .AMBULATORY SURGERY  DISCHARGE INSTRUCTIONS   The drugs that you were given will stay in your system until tomorrow so for the next 24 hours you should not:  Drive an automobile Make any legal decisions Drink any alcoholic beverage   You may resume regular meals tomorrow.  Today it is better to start with liquids and gradually work up to solid foods.  You may eat anything you prefer, but it is better to start with liquids, then soup and crackers, and gradually work up to solid foods.   Please notify your doctor immediately if you have any unusual bleeding, trouble breathing, redness and pain at the surgery site, drainage, fever, or pain not relieved by medication.   Your post-operative visit with Dr.                                     is: Date:                        Time:    Please call to schedule your post-operative visit.  Additional Instructions:

## 2023-06-26 NOTE — Anesthesia Postprocedure Evaluation (Signed)
Anesthesia Post Note  Patient: Lawrence [name]  Procedure(s) Performed: AMPUTATION TOE MPJ OF THE JOINT,PARTIAL TOE AMPUTUATION (Bilateral: Toe)  Patient location during evaluation: PACU Anesthesia Type: MAC Level of consciousness: awake and alert Pain management: pain level controlled Vital Signs Assessment: post-procedure vital signs reviewed and stable Respiratory status: spontaneous breathing, nonlabored ventilation, respiratory function stable and patient connected to nasal cannula oxygen Cardiovascular status: blood pressure returned to baseline and stable Postop Assessment: no apparent nausea or vomiting Anesthetic complications: no  No notable events documented.   Last Vitals:  Vitals:   06/26/23 1645 06/26/23 1703  BP: (!) 132/52 (!) 151/60  Pulse: 63 66  Resp: 20 18  Temp: (!) 36.1 C (!) 36.2 C  SpO2: 100% 100%    Last Pain:  Vitals:   06/26/23 1703  TempSrc: Temporal  PainSc: 0-No pain                 Stephanie Coup

## 2023-06-26 NOTE — Anesthesia Preprocedure Evaluation (Signed)
Anesthesia Evaluation  Patient identified by MRN, date of birth, ID band Patient awake    Reviewed: Allergy & Precautions, H&P , NPO status , Patient's Chart, lab work & pertinent test results, reviewed documented beta blocker date and time   Airway Mallampati: II  TM Distance: >3 FB Neck ROM: full    Dental no notable dental hx. (+) Teeth Intact   Pulmonary neg pulmonary ROS, shortness of breath, sleep apnea , former smoker   Pulmonary exam normal breath sounds clear to auscultation       Cardiovascular Exercise Tolerance: Poor hypertension, + angina  + CAD, + Past MI and + Peripheral Vascular Disease  negative cardio ROS  Rhythm:regular Rate:Normal     Neuro/Psych  Headaches  Anxiety     negative neurological ROS  negative psych ROS   GI/Hepatic negative GI ROS, Neg liver ROS,,,  Endo/Other  negative endocrine ROSdiabetes    Renal/GU Renal disease     Musculoskeletal   Abdominal   Peds  Hematology negative hematology ROS (+)   Anesthesia Other Findings   Reproductive/Obstetrics negative OB ROS                             Anesthesia Physical Anesthesia Plan  ASA: 3 and emergent  Anesthesia Plan: MAC   Post-op Pain Management:    Induction:   PONV Risk Score and Plan: 2  Airway Management Planned:   Additional Equipment:   Intra-op Plan:   Post-operative Plan:   Informed Consent: I have reviewed the patients History and Physical, chart, labs and discussed the procedure including the risks, benefits and alternatives for the proposed anesthesia with the patient or authorized representative who has indicated his/her understanding and acceptance.       Plan Discussed with: CRNA  Anesthesia Plan Comments:        Anesthesia Quick Evaluation

## 2023-06-26 NOTE — Op Note (Signed)
OPERATIVE REPORT Patient name: Lawrence [name] MRN: [id number] DOB: [birth year]  DOS: 06/26/23  Preop Dx: Osteomyelitis left third toe.  Right second toe. Postop Dx: same  Procedure:  1.  Amputation left third toe 2.  Amputation right second toe  Surgeon: Felecia Shelling DPM  Anesthesia: 50-50 mixture of 2% lidocaine plain with 0.5% Marcaine plain totaling 8 mL infiltrated in a digital block to each toe  Hemostasis: Calf tourniquet inflated to a pressure of after esmarch exsanguination   EBL: Minimal mL Materials: None Injectables: None Pathology: None  Condition: The patient tolerated the procedure and anesthesia well. No complications noted or reported   Justification for procedure: The patient is a 72 y.o. male PMHx prior amputations and diabetes mellitus who presents today for surgical correction of osteomyelitis with exposed bone to the left third toe and right second toe. All conservative modalities of been unsuccessful in providing any sort of satisfactory alleviation of symptoms with the patient. The patient was told benefits as well as possible side effects of the surgery. The patient consented for surgical correction. The patient consent form was reviewed. All patient questions were answered. No guarantees were expressed or implied. The patient and the surgeon both signed the patient consent form with the witness present and placed in the patient's chart.   Procedure in Detail: The patient was brought to the operating room, placed in the operating table in the supine position at which time an aseptic scrub and drape were performed about the patient's respective lower extremity after anesthesia was induced as described above. Attention was then directed to the surgical area where procedure number one commenced.  Procedure #1: Amputation left third toe A racquet type incision was planned and made about the third MTP of the left foot using a #15 scalpel.  Incision was  carried down to the level of joint capsule where the toe was grasped with a perforating towel And distracted distally and disarticulated at the level of the MTP.  The toe was removed in toto.  Any exposed tendons were distracted distally and cut as far proximal as could be visualized.  Margins appeared clean and viable.  Irrigation was utilized and primary closure was achieved using 3-0 nylon suture.  Procedure #2: Amputation right second toe Another racquet type incision was planned and made about the second MTP of the right foot using a #15 scalpel.  Incision was carried down to the level of joint capsule where the toe was grasped with a perforating towel And distracted distally and disarticulated at the level of the MTP.  The toe was removed in toto.  Any exposed tendons were distracted distally and cut as far proximal as could be visualized.  Margins appeared clean and viable.  Irrigation was utilized and primary closure was achieved using 3-0 nylon suture.  Dry sterile compressive dressings were then applied to all previously mentioned incision sites about the patient's lower extremity. The tourniquet which was used for hemostasis was deflated.   The patient was then transferred from the operating room to the recovery room having tolerated the procedure and anesthesia well. All vital signs are stable. After a brief stay in the recovery room the patient was readmitted to inpatient room with postoperative orders placed.    IMPRESSION: Clean margins.  Good potential for healing  Felecia Shelling, DPM [facility]  Dr. Felecia Shelling, DPM    [address]  [place], [place] [address]                Office [phone]  Fax [phone]

## 2023-06-26 NOTE — Interval H&P Note (Signed)
History and Physical Interval Note:  06/26/2023 3:29 PM  Lawrence [name]  has presented today for surgery, with the diagnosis of ULCER OF TOES WITH FAT LAYER EXPOSED.  The various methods of treatment have been discussed with the patient and family. After consideration of risks, benefits and other options for treatment, the patient has consented to  Procedure(s): AMPUTATION TOE MPJ OF THE JOINT,PARTIAL TOE AMPUTUATION (Bilateral) as a surgical intervention.  The patient's history has been reviewed, patient examined, no change in status, stable for surgery.  I have reviewed the patient's chart and labs.  Questions were answered to the patient's satisfaction.     Felecia Shelling

## 2023-06-26 NOTE — Transfer of Care (Signed)
Immediate Anesthesia Transfer of Care Note  Patient: Lawrence [name]  Procedure(s) Performed: AMPUTATION TOE MPJ OF THE JOINT,PARTIAL TOE AMPUTUATION (Bilateral: Toe)  Patient Location: PACU  Anesthesia Type:MAC  Level of Consciousness: awake, alert , and oriented  Airway & Oxygen Therapy: Patient Spontanous Breathing and Patient connected to face mask oxygen  Post-op Assessment: Report given to RN, Post -op Vital signs reviewed and stable, and Patient moving all extremities X 4  Post vital signs: Reviewed and stable  Last Vitals:  Vitals Value Taken Time  BP 114/43   Temp    Pulse 57 06/26/23 1622  Resp 18 06/26/23 1622  SpO2 100 % 06/26/23 1622  Vitals shown include unfiled device data.  Last Pain:  Vitals:   06/26/23 1412  TempSrc: Oral  PainSc: 0-No pain         Complications: No notable events documented.

## 2023-06-27 ENCOUNTER — Encounter: Payer: Self-pay | Admitting: Podiatry

## 2023-07-03 ENCOUNTER — Ambulatory Visit (INDEPENDENT_AMBULATORY_CARE_PROVIDER_SITE_OTHER): Payer: Medicare Other

## 2023-07-03 ENCOUNTER — Ambulatory Visit (INDEPENDENT_AMBULATORY_CARE_PROVIDER_SITE_OTHER): Payer: Medicare Other | Admitting: Podiatry

## 2023-07-03 ENCOUNTER — Encounter: Payer: Self-pay | Admitting: Podiatry

## 2023-07-03 DIAGNOSIS — L97522 Non-pressure chronic ulcer of other part of left foot with fat layer exposed: Secondary | ICD-10-CM | POA: Diagnosis not present

## 2023-07-03 DIAGNOSIS — L97512 Non-pressure chronic ulcer of other part of right foot with fat layer exposed: Secondary | ICD-10-CM

## 2023-07-03 DIAGNOSIS — M869 Osteomyelitis, unspecified: Secondary | ICD-10-CM

## 2023-07-03 DIAGNOSIS — Z9889 Other specified postprocedural states: Secondary | ICD-10-CM

## 2023-07-03 NOTE — Progress Notes (Signed)
Chief Complaint  Patient presents with   Routine Post Op    POV # 1 DOS 06/26/23 --- AMPUTATION RIGHT SECOND TOE, PARTIAL AMPUTATION LEFT THIRD TOE "It's doing pretty good except for the spasms and stabbing pains."    Subjective:  Patient presents today status post amputation of the left third toe and right second toe.  DOS: 06/26/2023.  Patient doing well.  Dressings are clean dry and intact.  WBAT surgical shoe  Past Medical History:  Diagnosis Date   Allergies    Anginal pain (HCC)    Bronchitis    PMH   CAD (coronary artery disease)    07/2007   Chronic kidney disease    stage 3   Diabetes (HCC)    Dyspnea    Elevated lipids    GERD (gastroesophageal reflux disease)    Glaucoma    HBP (high blood pressure)    Headache    MRSA (methicillin resistant staph aureus) culture positive    2006 - 2008   Myocardial infarction [facility])    Neuropathy    Osteoarthritis    PAD (peripheral artery disease) (HCC)    a. 03/2022 Lower Ext Angio: No signif Ao-iliac dzs. R PT diff dzs and occluded above ankle. R Pedal arch intact w/ excellent antegrade flow provided by R AT-->no indication for revasc.   Skin cancer    Skin ulcer of right great toe, unspecified ulcer stage (HCC)    Sleep apnea    CPAP   Wears glasses    Wears partial dentures    top    Past Surgical History:  Procedure Laterality Date   ABDOMINAL AORTOGRAM W/LOWER EXTREMITY N/A 03/26/2022   Procedure: ABDOMINAL AORTOGRAM W/LOWER EXTREMITY;  Surgeon: Iran Ouch, MD;  [facility] IN[facility];  Service: Cardiovascular;  Laterality: N/A;   AMPUTATION TOE Bilateral 06/26/2023   Procedure: AMPUTATION TOE MPJ OF THE JOINT,PARTIAL TOE AMPUTUATION;  Surgeon: Felecia Shelling, DPM;  Location: [facility] ORS;  Service: Orthopedics/Podiatry;  Laterality: Bilateral;   APPENDECTOMY     CARDIAC CATHETERIZATION N/A 06/11/2016   Procedure: Left Heart Cath and Coronary Angiography;  Surgeon: Lamar Blinks, MD;  Location: [facility]  INVASIVE CV LAB;  Service: Cardiovascular;  Laterality: N/A;   CARPAL TUNNEL RELEASE Left 04/24/2016   Procedure: CARPAL TUNNEL RELEASE;  Surgeon: Kennedy Bucker, MD;  Location: [facility] ORS;  Service: Orthopedics;  Laterality: Left;   CATARACT EXTRACTION W/ INTRAOCULAR LENS  IMPLANT, BILATERAL Bilateral    CATARACT EXTRACTION, BILATERAL     R eye 07/16/12, L eye 08/13/12 - with lens implant in both eyes   CORONARY ARTERY BYPASS GRAFT N/A 06/27/2016   Procedure: CORONARY ARTERY BYPASS GRAFTING times four using left internal mammary artery and right leg saphenous vein;  Surgeon: Kerin Perna, MD;  Location: [facility] OR;  Service: Open Heart Surgery;  Laterality: N/A;   EYE SURGERY Bilateral    JOINT REPLACEMENT     KNEE ARTHROPLASTY Left 02/11/2016   Procedure: COMPUTER ASSISTED TOTAL KNEE ARTHROPLASTY;  Surgeon: Donato Heinz, MD;  Location: [facility] ORS;  Service: Orthopedics;  Laterality: Left;   KNEE ARTHROSCOPY Right 2000   LEFT HEART CATH AND CORS/GRAFTS ANGIOGRAPHY Left 01/13/2017   Procedure: Left Heart Cath and Cors/Grafts Angiography;  Surgeon: Lamar Blinks, MD;  Location: [facility] INVASIVE CV LAB;  Service: Cardiovascular;  Laterality: Left;   LEFT HEART CATH AND CORS/GRAFTS ANGIOGRAPHY Left 06/09/2023   Procedure: LEFT HEART CATH AND CORS/GRAFTS ANGIOGRAPHY;  Surgeon: Marcina Millard,  MD;  Location: [facility] INVASIVE CV LAB;  Service: Cardiovascular;  Laterality: Left;   METATARSAL HEAD EXCISION Right 03/21/2020   Procedure: METATARSAL HEAD RESECTION RIGHT AND DEBRIDEMENT OF ULCER;  Surgeon: Felecia Shelling, DPM;  Location: [facility] OR;  Service: Podiatry;  Laterality: Right;   MULTIPLE TOOTH EXTRACTIONS     TEE WITHOUT CARDIOVERSION N/A 06/27/2016   Procedure: TRANSESOPHAGEAL ECHOCARDIOGRAM (TEE);  Surgeon: Kerin Perna, MD;  Location: [facility] OR;  Service: Open Heart Surgery;  Laterality: N/A;    Allergies  Allergen Reactions   Nitroglycerin Nausea Only and Other (See Comments)    Patches only -  headache    Statins Other (See Comments)    BODY PAIN, Pt taking Crestor*      Fluvastatin Other (See Comments)    Other reaction(s): Muscle pain   Fluvastatin Sodium     Other reaction(s): Myalgias   Rosuvastatin Other (See Comments)    Other reaction(s): Muscle pain, Myalgias   Simvastatin Other (See Comments)    Other reaction(s): Muscle pain, Myalgias   Ezetimibe Other (See Comments)    Other reaction(s): Muscle Pain   Lipitor [Atorvastatin] Other (See Comments) and Rash    BODY PAIN   Lisinopril Other (See Comments) and Nausea Only    Light headed, dizzy Other reaction(s): Dizziness    Objective/Physical Exam Neurovascular status intact.  Incision well coapted with sutures intact. No sign of infectious process noted. No dehiscence. No active bleeding noted.  There is no erythema or drainage.  Radiographic Exam B/L feet 07/03/2023:  Interval amputation of the second and third digits left foot as well as the great toe and second digit to the right foot with resection of the fifth metatarsal head as well.  Clean osteotomies.  No gas within the tissue.  Assessment: 1. s/p amputation left third and right second. DOS: 06/26/2023 2. PSxHx amputation RT great toe and 5th met head resection. LT second. 3.  Diabetes mellitus with peripheral polyneuropathy   Plan of Care:  -Patient was evaluated. X-rays reviewed - Betadine wet-to-dry dressings changed.  Clean dry and intact -Continue WBAT surgical shoes -Return to clinic 1 week   Felecia Shelling, DPM [facility]  Dr. Felecia Shelling, DPM    [address] [place], [place] [address]                Office [phone]  Fax [phone]

## 2023-07-10 ENCOUNTER — Encounter: Payer: Medicare Other | Admitting: Podiatry

## 2023-07-10 ENCOUNTER — Ambulatory Visit (INDEPENDENT_AMBULATORY_CARE_PROVIDER_SITE_OTHER): Payer: Medicare Other | Admitting: Podiatry

## 2023-07-10 DIAGNOSIS — M869 Osteomyelitis, unspecified: Secondary | ICD-10-CM

## 2023-07-10 NOTE — Progress Notes (Signed)
Chief Complaint  Patient presents with   Routine Post Op    POV # 2 DOS 06/26/23 --- AMPUTATION RIGHT SECOND TOE, PARTIAL AMPUTATION LEFT THIRD TOE    Subjective:  Patient presents today status post amputation of the left third toe and right second toe.  DOS: 06/26/2023.  Patient doing well.  Dressings are clean dry and intact.  WBAT surgical shoe  Past Medical History:  Diagnosis Date   Allergies    Anginal pain (HCC)    Bronchitis    PMH   CAD (coronary artery disease)    07/2007   Chronic kidney disease    stage 3   Diabetes (HCC)    Dyspnea    Elevated lipids    GERD (gastroesophageal reflux disease)    Glaucoma    HBP (high blood pressure)    Headache    MRSA (methicillin resistant staph aureus) culture positive    2006 - 2008   Myocardial infarction [facility])    Neuropathy    Osteoarthritis    PAD (peripheral artery disease) (HCC)    a. 03/2022 Lower Ext Angio: No signif Ao-iliac dzs. R PT diff dzs and occluded above ankle. R Pedal arch intact w/ excellent antegrade flow provided by R AT-->no indication for revasc.   Skin cancer    Skin ulcer of right great toe, unspecified ulcer stage (HCC)    Sleep apnea    CPAP   Wears glasses    Wears partial dentures    top    Past Surgical History:  Procedure Laterality Date   ABDOMINAL AORTOGRAM W/LOWER EXTREMITY N/A 03/26/2022   Procedure: ABDOMINAL AORTOGRAM W/LOWER EXTREMITY;  Surgeon: Iran Ouch, MD;  [facility];  Service: Cardiovascular;  Laterality: N/A;   AMPUTATION TOE Bilateral 06/26/2023   Procedure: AMPUTATION TOE MPJ OF THE JOINT,PARTIAL TOE AMPUTUATION;  Surgeon: Felecia Shelling, DPM;  Location: [facility] ORS;  Service: Orthopedics/Podiatry;  Laterality: Bilateral;   APPENDECTOMY     CARDIAC CATHETERIZATION N/A 06/11/2016   Procedure: Left Heart Cath and Coronary Angiography;  Surgeon: Lamar Blinks, MD;  Location: [facility] INVASIVE CV LAB;  Service: Cardiovascular;  Laterality: N/A;   CARPAL  TUNNEL RELEASE Left 04/24/2016   Procedure: CARPAL TUNNEL RELEASE;  Surgeon: Kennedy Bucker, MD;  Location: [facility] ORS;  Service: Orthopedics;  Laterality: Left;   CATARACT EXTRACTION W/ INTRAOCULAR LENS  IMPLANT, BILATERAL Bilateral    CATARACT EXTRACTION, BILATERAL     R eye 07/16/12, L eye 08/13/12 - with lens implant in both eyes   CORONARY ARTERY BYPASS GRAFT N/A 06/27/2016   Procedure: CORONARY ARTERY BYPASS GRAFTING times four using left internal mammary artery and right leg saphenous vein;  Surgeon: Kerin Perna, MD;  Location[facility] OR;  Service: Open Heart Surgery;  Laterality: N/A;   EYE SURGERY Bilateral    JOINT REPLACEMENT     KNEE ARTHROPLASTY Left 02/11/2016   Procedure: COMPUTER ASSISTED TOTAL KNEE ARTHROPLASTY;  Surgeon: Donato Heinz, MD;  Location: [facility] ORS;  Service: Orthopedics;  Laterality: Left;   KNEE ARTHROSCOPY Right 2000   LEFT HEART CATH AND CORS/GRAFTS ANGIOGRAPHY Left 01/13/2017   Procedure: Left Heart Cath and Cors/Grafts Angiography;  Surgeon: Lamar Blinks, MD;  Location: [facility] INVASIVE CV LAB;  Service: Cardiovascular;  Laterality: Left;   LEFT HEART CATH AND CORS/GRAFTS ANGIOGRAPHY Left 06/09/2023   Procedure: LEFT HEART CATH AND CORS/GRAFTS ANGIOGRAPHY;  Surgeon: Marcina Millard, MD;  Location: [facility] INVASIVE CV LAB;  Service: Cardiovascular;  Laterality: Left;   METATARSAL HEAD EXCISION Right 03/21/2020   Procedure: METATARSAL HEAD RESECTION RIGHT AND DEBRIDEMENT OF ULCER;  Surgeon: Felecia Shelling, DPM;  Location: [facility] OR;  Service: Podiatry;  Laterality: Right;   MULTIPLE TOOTH EXTRACTIONS     TEE WITHOUT CARDIOVERSION N/A 06/27/2016   Procedure: TRANSESOPHAGEAL ECHOCARDIOGRAM (TEE);  Surgeon: Kerin Perna, MD;  Location: [name] OR;  Service: Open Heart Surgery;  Laterality: N/A;    Allergies  Allergen Reactions   Nitroglycerin Nausea Only and Other (See Comments)    Patches only - headache    Statins Other (See Comments)    BODY PAIN, Pt taking  Crestor*      Fluvastatin Other (See Comments)    Other reaction(s): Muscle pain   Fluvastatin Sodium     Other reaction(s): Myalgias   Rosuvastatin Other (See Comments)    Other reaction(s): Muscle pain, Myalgias   Simvastatin Other (See Comments)    Other reaction(s): Muscle pain, Myalgias   Ezetimibe Other (See Comments)    Other reaction(s): Muscle Pain   Lipitor [Atorvastatin] Other (See Comments) and Rash    BODY PAIN   Lisinopril Other (See Comments) and Nausea Only    Light headed, dizzy Other reaction(s): Dizziness    Objective/Physical Exam Neurovascular status intact.  Incision well coapted with sutures intact. No sign of infectious process noted. No dehiscence. No active bleeding noted.  There is some very slight skin maceration noted to the left foot amputation site.  No dehiscence however.  No malodor.  It appears very stable  Radiographic Exam B/L feet 07/03/2023:  Interval amputation of the second and third digits left foot as well as the great toe and second digit to the right foot with resection of the fifth metatarsal head as well.  Clean osteotomies.  No gas within the tissue.  Assessment: 1. s/p amputation left third and right second. DOS: 06/26/2023 2. PSxHx amputation RT great toe and 5th met head resection. LT second. 3.  Diabetes mellitus with peripheral polyneuropathy   Plan of Care:  -Patient was evaluated. - Betadine wet-to-dry dressings changed.  Patient may begin changing the dressings daily at home.  They have the supplies for the Betadine and gauze already -Continue WBAT surgical shoes -Return to clinic 2 weeks suture removal   Felecia Shelling, DPM [facility]  Dr. Felecia Shelling, DPM    [address] [place], [place] [address]                Office [phone]  Fax [phone]

## 2023-07-24 ENCOUNTER — Ambulatory Visit (INDEPENDENT_AMBULATORY_CARE_PROVIDER_SITE_OTHER): Payer: Medicare Other | Admitting: Podiatry

## 2023-07-24 VITALS — BP 130/56 | HR 69

## 2023-07-24 DIAGNOSIS — M869 Osteomyelitis, unspecified: Secondary | ICD-10-CM

## 2023-07-24 NOTE — Progress Notes (Signed)
Chief Complaint  Patient presents with   Routine Post Op    "One's doing good, the left one isn't, it has a white spot."    Subjective:  Patient presents today status post amputation of the left third toe and right second toe.  DOS: 06/26/2023.  Patient continues to do well.  WBAT surgical shoes.  Betadine dressings daily  Past Medical History:  Diagnosis Date   Allergies    Anginal pain (HCC)    Bronchitis    PMH   CAD (coronary artery disease)    07/2007   Chronic kidney disease    stage 3   Diabetes (HCC)    Dyspnea    Elevated lipids    GERD (gastroesophageal reflux disease)    Glaucoma    HBP (high blood pressure)    Headache    MRSA (methicillin resistant staph aureus) culture positive    2006 - 2008   Myocardial infarction [facility])    Neuropathy    Osteoarthritis    PAD (peripheral artery disease) (HCC)    a. 03/2022 Lower Ext Angio: No signif Ao-iliac dzs. R PT diff dzs and occluded above ankle. R Pedal arch intact w/ excellent antegrade flow provided by R AT-->no indication for revasc.   Skin cancer    Skin ulcer of right great toe, unspecified ulcer stage (HCC)    Sleep apnea    CPAP   Wears glasses    Wears partial dentures    top    Past Surgical History:  Procedure Laterality Date   ABDOMINAL AORTOGRAM W/LOWER EXTREMITY N/A 03/26/2022   Procedure: ABDOMINAL AORTOGRAM W/LOWER EXTREMITY;  Surgeon: Iran Ouch, MD;  [facility];  Service: Cardiovascular;  Laterality: N/A;   AMPUTATION TOE Bilateral 06/26/2023   Procedure: AMPUTATION TOE MPJ OF THE JOINT,PARTIAL TOE AMPUTUATION;  Surgeon: Felecia Shelling, DPM;  Location: [facility] ORS;  Service: Orthopedics/Podiatry;  Laterality: Bilateral;   APPENDECTOMY     CARDIAC CATHETERIZATION N/A 06/11/2016   Procedure: Left Heart Cath and Coronary Angiography;  Surgeon: Lamar Blinks, MD;  Location: [facility];  Service: Cardiovascular;  Laterality: N/A;   CARPAL TUNNEL RELEASE Left  04/24/2016   Procedure: CARPAL TUNNEL RELEASE;  Surgeon: Kennedy Bucker, MD;  Location: [facility];  Service: Orthopedics;  Laterality: Left;   CATARACT EXTRACTION W/ INTRAOCULAR LENS  IMPLANT, BILATERAL Bilateral    CATARACT EXTRACTION, BILATERAL     R eye 07/16/12, L eye 08/13/12 - with lens implant in both eyes   CORONARY ARTERY BYPASS GRAFT N/A 06/27/2016   Procedure: CORONARY ARTERY BYPASS GRAFTING times four using left internal mammary artery and right leg saphenous vein;  Surgeon: Kerin Perna, MD;  Location: [facility] OR;  Service: Open Heart Surgery;  Laterality: N/A;   EYE SURGERY Bilateral    JOINT REPLACEMENT     KNEE ARTHROPLASTY Left 02/11/2016   Procedure: COMPUTER ASSISTED TOTAL KNEE ARTHROPLASTY;  Surgeon: Donato Heinz, MD;  Location: [facility] ORS;  Service: Orthopedics;  Laterality: Left;   KNEE ARTHROSCOPY Right 2000   LEFT HEART CATH AND CORS/GRAFTS ANGIOGRAPHY Left 01/13/2017   Procedure: Left Heart Cath and Cors/Grafts Angiography;  Surgeon: Lamar Blinks, MD;  Location: [facility];  Service: Cardiovascular;  Laterality: Left;   LEFT HEART CATH AND CORS/GRAFTS ANGIOGRAPHY Left 06/09/2023   Procedure: LEFT HEART CATH AND CORS/GRAFTS ANGIOGRAPHY;  Surgeon: Marcina Millard, MD;  Location: [facility];  Service: Cardiovascular;  Laterality: Left;  METATARSAL HEAD EXCISION Right 03/21/2020   Procedure: METATARSAL HEAD RESECTION RIGHT AND DEBRIDEMENT OF ULCER;  Surgeon: Felecia Shelling, DPM;  [facility]: [facility];  Service: Podiatry;  Laterality: Right;   MULTIPLE TOOTH EXTRACTIONS     TEE WITHOUT CARDIOVERSION N/A 06/27/2016   Procedure: TRANSESOPHAGEAL ECHOCARDIOGRAM (TEE);  Surgeon: Kerin Perna, MD;  Location: [facility] OR;  Service: Open Heart Surgery;  Laterality: N/A;    Allergies  Allergen Reactions   Nitroglycerin Nausea Only and Other (See Comments)    Patches only - headache    Statins Other (See Comments)    BODY PAIN, Pt taking Crestor*       Fluvastatin Other (See Comments)    Other reaction(s): Muscle pain   Fluvastatin Sodium     Other reaction(s): Myalgias   Rosuvastatin Other (See Comments)    Other reaction(s): Muscle pain, Myalgias   Simvastatin Other (See Comments)    Other reaction(s): Muscle pain, Myalgias   Ezetimibe Other (See Comments)    Other reaction(s): Muscle Pain   Lipitor [Atorvastatin] Other (See Comments) and Rash    BODY PAIN   Lisinopril Other (See Comments) and Nausea Only    Light headed, dizzy Other reaction(s): Dizziness    Objective/Physical Exam Neurovascular status intact.  Incision well coapted with sutures intact. No sign of infectious process noted. No dehiscence. No active bleeding noted.  There is some very slight skin maceration noted to the left foot amputation site.  No dehiscence however.  No malodor.  It appears very stable  Radiographic Exam B/L feet 07/03/2023:  Interval amputation of the second and third digits left foot as well as the great toe and second digit to the right foot with resection of the fifth metatarsal head as well.  Clean osteotomies.  No gas within the tissue.  Assessment: 1. s/p amputation left third and right second. DOS: 06/26/2023 2. PSxHx amputation RT great toe and 5th met head resection. LT second. 3.  Diabetes mellitus with peripheral polyneuropathy   Plan of Care:  -Patient was evaluated.  Sutures removed - Light debridement of the area performed.  Overall the amputation sites appear to be healing nicely.  There is some slight maceration to the left foot but there is no obvious indication of dehiscence of the amputation site. -Continue Betadine with gauze to the left foot -Return to clinic 2 weeks   Felecia Shelling, DPM [facility]  Dr. Felecia Shelling, DPM    [address] [place], [place] [address]                Office [phone]  Fax [phone]

## 2023-08-07 ENCOUNTER — Encounter: Payer: Self-pay | Admitting: Podiatry

## 2023-08-07 ENCOUNTER — Ambulatory Visit (INDEPENDENT_AMBULATORY_CARE_PROVIDER_SITE_OTHER): Payer: Medicare Other | Admitting: Podiatry

## 2023-08-07 DIAGNOSIS — E0843 Diabetes mellitus due to underlying condition with diabetic autonomic (poly)neuropathy: Secondary | ICD-10-CM

## 2023-08-07 DIAGNOSIS — L97522 Non-pressure chronic ulcer of other part of left foot with fat layer exposed: Secondary | ICD-10-CM

## 2023-08-07 DIAGNOSIS — S98132S Complete traumatic amputation of one left lesser toe, sequela: Secondary | ICD-10-CM

## 2023-08-07 NOTE — Progress Notes (Signed)
Chief Complaint  Patient presents with   Routine Post Op    POV #4-left third toe and right second toe.  DOS: 06/26/2023.    Subjective:  Patient presents today status post amputation of the left third toe and right second toe.  DOS: 06/26/2023.  Patient continues to do well.    Over the past week the patient and his spouse have noticed a wound developing to the plantar aspect of the left foot.  They are unsure what would have elicited the wound.  They have not done anything for treatment.  Past Medical History:  Diagnosis Date   Allergies    Anginal pain (HCC)    Bronchitis    PMH   CAD (coronary artery disease)    07/2007   Chronic kidney disease    stage 3   Diabetes (HCC)    Dyspnea    Elevated lipids    GERD (gastroesophageal reflux disease)    Glaucoma    HBP (high blood pressure)    Headache    MRSA (methicillin resistant staph aureus) culture positive    2006 - 2008   Myocardial infarction [facility])    Neuropathy    Osteoarthritis    PAD (peripheral artery disease) (HCC)    a. 03/2022 Lower Ext Angio: No signif Ao-iliac dzs. R PT diff dzs and occluded above ankle. R Pedal arch intact w/ excellent antegrade flow provided by R AT-->no indication for revasc.   Skin cancer    Skin ulcer of right great toe, unspecified ulcer stage (HCC)    Sleep apnea    CPAP   Wears glasses    Wears partial dentures    top    Past Surgical History:  Procedure Laterality Date   ABDOMINAL AORTOGRAM W/LOWER EXTREMITY N/A 03/26/2022   Procedure: ABDOMINAL AORTOGRAM W/LOWER EXTREMITY;  Surgeon: Iran Ouch, MD;  [facility];  Service: Cardiovascular;  Laterality: N/A;   AMPUTATION TOE Bilateral 06/26/2023   Procedure: AMPUTATION TOE MPJ OF THE JOINT,PARTIAL TOE AMPUTUATION;  Surgeon: Felecia Shelling, DPM;  Location: [facility];  Service: Orthopedics/Podiatry;  Laterality: Bilateral;   APPENDECTOMY     CARDIAC CATHETERIZATION N/A 06/11/2016   Procedure: Left Heart  Cath and Coronary Angiography;  Surgeon: Lamar Blinks, MD;  Location: [facility];  Service: Cardiovascular;  Laterality: N/A;   CARPAL TUNNEL RELEASE Left 04/24/2016   Procedure: CARPAL TUNNEL RELEASE;  Surgeon: Kennedy Bucker, MD;  Location: [facility];  Service: Orthopedics;  Laterality: Left;   CATARACT EXTRACTION W/ INTRAOCULAR LENS  IMPLANT, BILATERAL Bilateral    CATARACT EXTRACTION, BILATERAL     R eye 07/16/12, L eye 08/13/12 - with lens implant in both eyes   CORONARY ARTERY BYPASS GRAFT N/A 06/27/2016   Procedure: CORONARY ARTERY BYPASS GRAFTING times four using left internal mammary artery and right leg saphenous vein;  Surgeon: Kerin Perna, MD;  Location: [facility] OR;  Service: Open Heart Surgery;  Laterality: N/A;   EYE SURGERY Bilateral    JOINT REPLACEMENT     KNEE ARTHROPLASTY Left 02/11/2016   Procedure: COMPUTER ASSISTED TOTAL KNEE ARTHROPLASTY;  Surgeon: Donato Heinz, MD;  Location: [facility];  Service: Orthopedics;  Laterality: Left;   KNEE ARTHROSCOPY Right 2000   LEFT HEART CATH AND CORS/GRAFTS ANGIOGRAPHY Left 01/13/2017   Procedure: Left Heart Cath and Cors/Grafts Angiography;  Surgeon: Lamar Blinks, MD;  Location: [facility];  Service: Cardiovascular;  Laterality: Left;   LEFT HEART  CATH AND CORS/GRAFTS ANGIOGRAPHY Left 06/09/2023   Procedure: LEFT HEART CATH AND CORS/GRAFTS ANGIOGRAPHY;  Surgeon: Marcina Millard, MD;  Location: [facility];  Service: Cardiovascular;  Laterality: Left;   METATARSAL HEAD EXCISION Right 03/21/2020   Procedure: METATARSAL HEAD RESECTION RIGHT AND DEBRIDEMENT OF ULCER;  Surgeon: Felecia Shelling, DPM;  [facility];  Service: Podiatry;  Laterality: Right;   MULTIPLE TOOTH EXTRACTIONS     TEE WITHOUT CARDIOVERSION N/A 06/27/2016   Procedure: TRANSESOPHAGEAL ECHOCARDIOGRAM (TEE);  Surgeon: Kerin Perna, MD;  Location: [facility] OR;  Service: Open Heart Surgery;  Laterality: N/A;    Allergies  Allergen  Reactions   Nitroglycerin Nausea Only and Other (See Comments)    Patches only - headache    Statins Other (See Comments)    BODY PAIN, Pt taking Crestor*      Fluvastatin Other (See Comments)    Other reaction(s): Muscle pain   Fluvastatin Sodium     Other reaction(s): Myalgias   Rosuvastatin Other (See Comments)    Other reaction(s): Muscle pain, Myalgias   Simvastatin Other (See Comments)    Other reaction(s): Muscle pain, Myalgias   Ezetimibe Other (See Comments)    Other reaction(s): Muscle Pain   Lipitor [Atorvastatin] Other (See Comments) and Rash    BODY PAIN   Lisinopril Other (See Comments) and Nausea Only    Light headed, dizzy Other reaction(s): Dizziness    Objective/Physical Exam Neurovascular status intact.  The amputation site has completely healed and resolved.  There is no open wound or dehiscence.  The incision is nicely healed.  New onset of an ulcer noted to the plantar aspect of the fifth MTP left likely secondary to pressure.  Please see above noted photo.  The wound measures approximately 2.0 x 1.7 x 0.1 cm.  Red wound base with superficial eschar to the central portion of the wound.  No drainage.  Overall it appears very stable  Radiographic Exam B/L feet 07/03/2023:  Interval amputation of the second and third digits left foot as well as the great toe and second digit to the right foot with resection of the fifth metatarsal head as well.  Clean osteotomies.  No gas within the tissue.  Assessment: 1. s/p amputation left third and right second. DOS: 06/26/2023; healed 2. PSxHx amputation RT great toe and 5th met head resection. LT second. 3.  Diabetes mellitus with peripheral polyneuropathy 4.  New onset of ulcer plantar aspect of the fifth MTP left   Plan of Care:  -Patient was evaluated.   -Order placed for diabetic shoes with custom molded Plastizote insoles. -Appointment made with diabetic shoe department -Medically necessary excisional  debridement including subcutaneous tissue was performed today using a tissue nipper.  Excisional debridement of the necrotic nonviable tissue down to healthier bleeding viable tissue was performed with postdebridement measurement same as pre- -Patient has gentamicin cream at home.  Apply daily with a light dressing -WBAT surgical shoe -Return to clinic 3 weeks  Felecia Shelling, DPM [facility]  Dr. Felecia Shelling, DPM    [address] [place], [place] [address]                Office [phone]  Fax [phone]

## 2023-08-14 ENCOUNTER — Ambulatory Visit: Payer: Medicare Other | Admitting: Podiatry

## 2023-08-14 ENCOUNTER — Encounter: Payer: Self-pay | Admitting: Podiatry

## 2023-08-14 VITALS — BP 128/54 | HR 64

## 2023-08-14 DIAGNOSIS — L03032 Cellulitis of left toe: Secondary | ICD-10-CM

## 2023-08-14 DIAGNOSIS — L97522 Non-pressure chronic ulcer of other part of left foot with fat layer exposed: Secondary | ICD-10-CM

## 2023-08-14 DIAGNOSIS — E0843 Diabetes mellitus due to underlying condition with diabetic autonomic (poly)neuropathy: Secondary | ICD-10-CM

## 2023-08-14 MED ORDER — DOXYCYCLINE HYCLATE 100 MG PO TABS: 100.0000 mg | ORAL_TABLET | Freq: Two times a day (BID) | ORAL | 0 refills | Status: DC

## 2023-08-14 NOTE — Progress Notes (Signed)
Chief Complaint  Patient presents with   Post-op Problem    "The ones that Dr. Logan Bores said looked bruised, they're angry.  One has a blister on it.  There's some drainage."    Subjective:  Patient presents today status post amputation of the left third toe and right second toe.  DOS: 06/26/2023.  The amputation site has healed  Patient presenting today for evaluation of blisters developing especially to the left great toe.  Also for follow-up regarding ulcer to the plantar aspect of the fifth MTP left  Past Medical History:  Diagnosis Date   Allergies    Anginal pain (HCC)    Bronchitis    PMH   CAD (coronary artery disease)    07/2007   Chronic kidney disease    stage 3   Diabetes (HCC)    Dyspnea    Elevated lipids    GERD (gastroesophageal reflux disease)    Glaucoma    HBP (high blood pressure)    Headache    MRSA (methicillin resistant staph aureus) culture positive    2006 - 2008   Myocardial infarction [facility])    Neuropathy    Osteoarthritis    PAD (peripheral artery disease) (HCC)    a. 03/2022 Lower Ext Angio: No signif Ao-iliac dzs. R PT diff dzs and occluded above ankle. R Pedal arch intact w/ excellent antegrade flow provided by R AT-->no indication for revasc.   Skin cancer    Skin ulcer of right great toe, unspecified ulcer stage (HCC)    Sleep apnea    CPAP   Wears glasses    Wears partial dentures    top    Past Surgical History:  Procedure Laterality Date   ABDOMINAL AORTOGRAM W/LOWER EXTREMITY N/A 03/26/2022   Procedure: ABDOMINAL AORTOGRAM W/LOWER EXTREMITY;  Surgeon: Iran Ouch, MD;  [facility];  Service: Cardiovascular;  Laterality: N/A;   AMPUTATION TOE Bilateral 06/26/2023   Procedure: AMPUTATION TOE MPJ OF THE JOINT,PARTIAL TOE AMPUTUATION;  Surgeon: Felecia Shelling, DPM;  Location: [facility];  Service: Orthopedics/Podiatry;  Laterality: Bilateral;   APPENDECTOMY     CARDIAC CATHETERIZATION N/A 06/11/2016   Procedure:  Left Heart Cath and Coronary Angiography;  Surgeon: Lamar Blinks, MD;  Location: [facility];  Service: Cardiovascular;  Laterality: N/A;   CARPAL TUNNEL RELEASE Left 04/24/2016   Procedure: CARPAL TUNNEL RELEASE;  Surgeon: Kennedy Bucker, MD;  Location: [facility];  Service: Orthopedics;  Laterality: Left;   CATARACT EXTRACTION W/ INTRAOCULAR LENS  IMPLANT, BILATERAL Bilateral    CATARACT EXTRACTION, BILATERAL     R eye 07/16/12, L eye 08/13/12 - with lens implant in both eyes   CORONARY ARTERY BYPASS GRAFT N/A 06/27/2016   Procedure: CORONARY ARTERY BYPASS GRAFTING times four using left internal mammary artery and right leg saphenous vein;  Surgeon: Kerin Perna, MD;  Location: [facility] OR;  Service: Open Heart Surgery;  Laterality: N/A;   EYE SURGERY Bilateral    JOINT REPLACEMENT     KNEE ARTHROPLASTY Left 02/11/2016   Procedure: COMPUTER ASSISTED TOTAL KNEE ARTHROPLASTY;  Surgeon: Donato Heinz, MD;  Location: [facility];  Service: Orthopedics;  Laterality: Left;   KNEE ARTHROSCOPY Right 2000   LEFT HEART CATH AND CORS/GRAFTS ANGIOGRAPHY Left 01/13/2017   Procedure: Left Heart Cath and Cors/Grafts Angiography;  Surgeon: Lamar Blinks, MD;  Location: [facility];  Service: Cardiovascular;  Laterality: Left;   LEFT HEART CATH AND CORS/GRAFTS ANGIOGRAPHY  Left 06/09/2023   Procedure: LEFT HEART CATH AND CORS/GRAFTS ANGIOGRAPHY;  Surgeon: Marcina Millard, MD;  Location: [facility];  Service: Cardiovascular;  Laterality: Left;   METATARSAL HEAD EXCISION Right 03/21/2020   Procedure: METATARSAL HEAD RESECTION RIGHT AND DEBRIDEMENT OF ULCER;  Surgeon: Felecia Shelling, DPM;  [facility];  Service: Podiatry;  Laterality: Right;   MULTIPLE TOOTH EXTRACTIONS     TEE WITHOUT CARDIOVERSION N/A 06/27/2016   Procedure: TRANSESOPHAGEAL ECHOCARDIOGRAM (TEE);  Surgeon: Kerin Perna, MD;  Location: [facility] OR;  Service: Open Heart Surgery;  Laterality: N/A;    Allergies   Allergen Reactions   Nitroglycerin Nausea Only and Other (See Comments)    Patches only - headache    Statins Other (See Comments)    BODY PAIN, Pt taking Crestor*      Fluvastatin Other (See Comments)    Other reaction(s): Muscle pain   Fluvastatin Sodium     Other reaction(s): Myalgias   Rosuvastatin Other (See Comments)    Other reaction(s): Muscle pain, Myalgias   Simvastatin Other (See Comments)    Other reaction(s): Muscle pain, Myalgias   Ezetimibe Other (See Comments)    Other reaction(s): Muscle Pain   Lipitor [Atorvastatin] Other (See Comments) and Rash    BODY PAIN   Lisinopril Other (See Comments) and Nausea Only    Light headed, dizzy Other reaction(s): Dizziness    LT foot 08/07/2023   LT foot 08/14/2023  LT foot 08/14/2023  Objective/Physical Exam Neurovascular status intact.  The amputation site remains nicely healed.  There is a small area after debridement of the amputation site.  Please see above noted photo  Ulcer to the plantar aspect of the fifth MTP left likely secondary to pressure.  There does appear to be some improvement.  Measures approximately 1.5 x 1.5 x 0.1 cm.  Granular wound base.  Onset of preulcerative blister lesion noted to the distal tip of the left great toe.  Serous drainage.  There is some slight erythema around the distal portion of the left great toe.  Radiographic Exam B/L feet 07/03/2023:  Interval amputation of the second and third digits left foot as well as the great toe and second digit to the right foot with resection of the fifth metatarsal head as well.  Clean osteotomies.  No gas within the tissue.  Assessment: 1. s/p amputation left third and right second. DOS: 06/26/2023; healed 2. PSxHx amputation RT great toe and 5th met head resection. LT second. 3.  Diabetes mellitus with peripheral polyneuropathy 4.  New onset of ulcer plantar aspect of the fifth MTP left 5.  Cellulitis left great toe  Plan of Care:   -Patient was evaluated.   - Diabetic shoes pending.  Order placed for diabetic shoes with custom molded Plastizote insoles on 08/07/2023. -Medically necessary excisional debridement including subcutaneous tissue was performed today using a tissue nipper.  Excisional debridement of the necrotic nonviable tissue down to healthier bleeding viable tissue was performed with postdebridement measurement same as pre- -Patient has gentamicin cream at home.  Continue to apply daily -Prescription for doxycycline 100 mg 2 times daily #20 -Return to clinic next scheduled appointment  Felecia Shelling, DPM [facility]  Dr. Felecia Shelling, DPM    [address]  [place], [place] [address]                Office [phone]  Fax [phone]

## 2023-08-25 ENCOUNTER — Other Ambulatory Visit: Payer: Self-pay | Admitting: Podiatry

## 2023-08-25 MED ORDER — DOXYCYCLINE HYCLATE 100 MG PO TABS: 100.0000 mg | ORAL_TABLET | Freq: Two times a day (BID) | ORAL | 0 refills | Status: DC

## 2023-08-28 ENCOUNTER — Telehealth: Payer: Self-pay | Admitting: Primary Care

## 2023-08-28 ENCOUNTER — Encounter: Payer: Self-pay | Admitting: Podiatry

## 2023-08-28 ENCOUNTER — Ambulatory Visit (INDEPENDENT_AMBULATORY_CARE_PROVIDER_SITE_OTHER): Payer: Medicare Other | Admitting: Podiatry

## 2023-08-28 DIAGNOSIS — E0843 Diabetes mellitus due to underlying condition with diabetic autonomic (poly)neuropathy: Secondary | ICD-10-CM

## 2023-08-28 DIAGNOSIS — L97522 Non-pressure chronic ulcer of other part of left foot with fat layer exposed: Secondary | ICD-10-CM

## 2023-08-28 NOTE — H&P (View-Only) (Signed)
Chief Complaint  Patient presents with   Wound Check    Wife stated, "They're iffy."    Subjective:  Patient presents today status post amputation of the left third toe and right second toe.  DOS: 06/26/2023.  The amputation site has healed  Patient presenting today for evaluation of blisters developing especially to the left great toe.  Also for follow-up regarding ulcer to the plantar aspect of the fifth MTP left  Past Medical History:  Diagnosis Date   Allergies    Anginal pain (HCC)    Bronchitis    PMH   CAD (coronary artery disease)    07/2007   Chronic kidney disease    stage 3   Diabetes (HCC)    Dyspnea    Elevated lipids    GERD (gastroesophageal reflux disease)    Glaucoma    HBP (high blood pressure)    Headache    MRSA (methicillin resistant staph aureus) culture positive    2006 - 2008   Myocardial infarction [facility])    Neuropathy    Osteoarthritis    PAD (peripheral artery disease) (HCC)    a. 03/2022 Lower Ext Angio: No signif Ao-iliac dzs. R PT diff dzs and occluded above ankle. R Pedal arch intact w/ excellent antegrade flow provided by R AT-->no indication for revasc.   Skin cancer    Skin ulcer of right great toe, unspecified ulcer stage (HCC)    Sleep apnea    CPAP   Wears glasses    Wears partial dentures    top    Past Surgical History:  Procedure Laterality Date   ABDOMINAL AORTOGRAM W/LOWER EXTREMITY N/A 03/26/2022   Procedure: ABDOMINAL AORTOGRAM W/LOWER EXTREMITY;  Surgeon: Iran Ouch, MD;  Location: [facility];  Service: Cardiovascular;  Laterality: N/A;   AMPUTATION TOE Bilateral 06/26/2023   Procedure: AMPUTATION TOE MPJ OF THE JOINT,PARTIAL TOE AMPUTUATION;  Surgeon: Felecia Shelling, DPM;  Location: [facility] ORS;  Service: Orthopedics/Podiatry;  Laterality: Bilateral;   APPENDECTOMY     CARDIAC CATHETERIZATION N/A 06/11/2016   Procedure: Left Heart Cath and Coronary Angiography;  Surgeon: Lamar Blinks, MD;  Location: [facility]  INVASIVE CV LAB;  Service: Cardiovascular;  Laterality: N/A;   CARPAL TUNNEL RELEASE Left 04/24/2016   Procedure: CARPAL TUNNEL RELEASE;  Surgeon: Kennedy Bucker, MD;  Location: [facility] ORS;  Service: Orthopedics;  Laterality: Left;   CATARACT EXTRACTION W/ INTRAOCULAR LENS  IMPLANT, BILATERAL Bilateral    CATARACT EXTRACTION, BILATERAL     R eye 07/16/12, L eye 08/13/12 - with lens implant in both eyes   CORONARY ARTERY BYPASS GRAFT N/A 06/27/2016   Procedure: CORONARY ARTERY BYPASS GRAFTING times four using left internal mammary artery and right leg saphenous vein;  Surgeon: Kerin Perna, MD;  Location: [facility] OR;  Service: Open Heart Surgery;  Laterality: N/A;   EYE SURGERY Bilateral    JOINT REPLACEMENT     KNEE ARTHROPLASTY Left 02/11/2016   Procedure: COMPUTER ASSISTED TOTAL KNEE ARTHROPLASTY;  Surgeon: Donato Heinz, MD;  Location: [facility] ORS;  Service: Orthopedics;  Laterality: Left;   KNEE ARTHROSCOPY Right 2000   LEFT HEART CATH AND CORS/GRAFTS ANGIOGRAPHY Left 01/13/2017   Procedure: Left Heart Cath and Cors/Grafts Angiography;  Surgeon: Lamar Blinks, MD;  Location: [facility];  Service: Cardiovascular;  Laterality: Left;   LEFT HEART CATH AND CORS/GRAFTS ANGIOGRAPHY Left 06/09/2023   Procedure: LEFT HEART CATH AND CORS/GRAFTS ANGIOGRAPHY;  Surgeon: Marcina Millard, MD;  Location: [facility];  Service: Cardiovascular;  Laterality: Left;   METATARSAL HEAD EXCISION Right 03/21/2020   Procedure: METATARSAL HEAD RESECTION RIGHT AND DEBRIDEMENT OF ULCER;  Surgeon: Felecia Shelling, DPM;  Location: [facility] OR;  Service: Podiatry;  Laterality: Right;   MULTIPLE TOOTH EXTRACTIONS     TEE WITHOUT CARDIOVERSION N/A 06/27/2016   Procedure: TRANSESOPHAGEAL ECHOCARDIOGRAM (TEE);  Surgeon: Kerin Perna, MD;  Location: [facility] OR;  Service: Open Heart Surgery;  Laterality: N/A;    Allergies  Allergen Reactions   Nitroglycerin Nausea Only and Other (See Comments)    Patches only -  headache    Statins Other (See Comments)    BODY PAIN, Pt taking Crestor*      Fluvastatin Other (See Comments)    Other reaction(s): Muscle pain   Fluvastatin Sodium     Other reaction(s): Myalgias   Rosuvastatin Other (See Comments)    Other reaction(s): Muscle pain, Myalgias   Simvastatin Other (See Comments)    Other reaction(s): Muscle pain, Myalgias   Ezetimibe Other (See Comments)    Other reaction(s): Muscle Pain   Lipitor [Atorvastatin] Other (See Comments) and Rash    BODY PAIN   Lisinopril Other (See Comments) and Nausea Only    Light headed, dizzy Other reaction(s): Dizziness    LT foot 08/07/2023   LT foot 08/14/2023  LT foot 08/14/2023  Objective/Physical Exam Neurovascular status intact.  The amputation site remains nicely healed.   Ulcer to the plantar aspect of the fifth MTP left likely secondary to pressure.  Measures approximately 1.5 x 1.5 x 0.1 cm.  Granular wound base.  The preulcerative callus lesion to the distal tip of the left great toe appears stable.  Radiographic Exam B/L feet 07/03/2023:  Interval amputation of the second and third digits left foot as well as the great toe and second digit to the right foot with resection of the fifth metatarsal head as well.  Clean osteotomies.  No gas within the tissue.  Assessment: 1. s/p amputation left third and right second. DOS: 06/26/2023; healed 2. PSxHx amputation RT great toe and 5th met head resection. LT second. 3.  Diabetes mellitus with peripheral polyneuropathy 4.  Ulcer plantar aspect of the fifth MTP left  Plan of Care:  -Patient was evaluated.   -Diabetic shoes pending.  Order placed for diabetic shoes with custom molded Plastizote insoles on 08/07/2023. -Medically necessary excisional debridement including subcutaneous tissue was performed today using a tissue nipper.  Excisional debridement of the necrotic nonviable tissue down to healthier bleeding viable tissue was performed with  postdebridement measurement same as pre- -Patient has gentamicin cream at home.  Continue to apply daily - The patient has a history of fifth metatarsal head resection to the right foot which alleviated his ulcer to the plantar aspect of the fifth MTP.  He has not had any trouble or problems since then.  He is also demonstrating good healing postoperatively.  I do believe it would be appropriate for the patient at this time to consider fifth metatarsal head resection to the left foot.  Unfortunately the patient has a history of rapid deterioration of wounds and ulcers that developed osteomyelitis rather quickly.  The procedure was explained in detail to the patient.  No guarantees were expressed or implied.  Risk benefits advantages disadvantages of the procedure were explained.  All patient questions were answered.  After discussion with the patient he would like to proceed with fifth metatarsal head resection of the  left foot -Authorization for surgery was initiated today.  Surgery will consist of fifth metatarsal head resection left.  Debridement of ulcer with possible application of wound graft left -Return to clinic 1 week postop  Felecia Shelling, DPM [facility]  Dr. Felecia Shelling, DPM    [address] [place], [place] [address]                Office [phone]  Fax [phone]

## 2023-08-28 NOTE — Progress Notes (Signed)
Chief Complaint  Patient presents with   Wound Check    Wife stated, "They're iffy."    Subjective:  Patient presents today status post amputation of the left third toe and right second toe.  DOS: 06/26/2023.  The amputation site has healed  Patient presenting today for evaluation of blisters developing especially to the left great toe.  Also for follow-up regarding ulcer to the plantar aspect of the fifth MTP left  Past Medical History:  Diagnosis Date   Allergies    Anginal pain (HCC)    Bronchitis    PMH   CAD (coronary artery disease)    07/2007   Chronic kidney disease    stage 3   Diabetes (HCC)    Dyspnea    Elevated lipids    GERD (gastroesophageal reflux disease)    Glaucoma    HBP (high blood pressure)    Headache    MRSA (methicillin resistant staph aureus) culture positive    2006 - 2008   Myocardial infarction [facility])    Neuropathy    Osteoarthritis    PAD (peripheral artery disease) (HCC)    a. 03/2022 Lower Ext Angio: No signif Ao-iliac dzs. R PT diff dzs and occluded above ankle. R Pedal arch intact w/ excellent antegrade flow provided by R AT-->no indication for revasc.   Skin cancer    Skin ulcer of right great toe, unspecified ulcer stage (HCC)    Sleep apnea    CPAP   Wears glasses    Wears partial dentures    top    Past Surgical History:  Procedure Laterality Date   ABDOMINAL AORTOGRAM W/LOWER EXTREMITY N/A 03/26/2022   Procedure: ABDOMINAL AORTOGRAM W/LOWER EXTREMITY;  Surgeon: Iran Ouch, MD;  [facility]: [facility];  Service: Cardiovascular;  Laterality: N/A;   AMPUTATION TOE Bilateral 06/26/2023   Procedure: AMPUTATION TOE MPJ OF THE JOINT,PARTIAL TOE AMPUTUATION;  Surgeon: Felecia Shelling, DPM;  Location: [facility] OR[facility];  Service: Orthopedics/Podiatry;  Laterality: Bilateral;   APPENDECTOMY     CARDIAC CATHETERIZATION N/A 06/11/2016   Procedure: Left Heart Cath and Coronary Angiography;  Surgeon: Lamar Blinks, MD;  Location: [facility]  INVASIVE CV [facility];  Service: Cardiovascular;  Laterality: N/A;   CARPAL TUNNEL RELEASE Left 04/24/2016   Procedure: CARPAL TUNNEL RELEASE;  Surgeon: Kennedy Bucker, MD;  Location: [facility] ORS;  Service: Orthopedics;  Laterality: Left;   CATARACT EXTRACTION W/ INTRAOCULAR LENS  IMPLANT, BILATERAL Bilateral    CATARACT EXTRACTION, BILATERAL     R eye 07/16/12, L eye 08/13/12 - with lens implant in both eyes   CORONARY ARTERY BYPASS GRAFT N/A 06/27/2016   Procedure: CORONARY ARTERY BYPASS GRAFTING times four using left internal mammary artery and right leg saphenous vein;  Surgeon: Kerin Perna, MD;  Location: [facility] OR;  Service: Open Heart Surgery;  Laterality: N/A;   EYE SURGERY Bilateral    JOINT REPLACEMENT     KNEE ARTHROPLASTY Left 02/11/2016   Procedure: COMPUTER ASSISTED TOTAL KNEE ARTHROPLASTY;  Surgeon: Donato Heinz, MD;  Location: [facility] OR[facility];  Service: Orthopedics;  Laterality: Left;   KNEE ARTHROSCOPY Right 2000   LEFT HEART CATH AND CORS/GRAFTS ANGIOGRAPHY Left 01/13/2017   Procedure: Left Heart Cath and Cors/Grafts Angiography;  Surgeon: Lamar Blinks, MD;  Location: [facility];  Service: Cardiovascular;  Laterality: Left;   LEFT HEART CATH AND CORS/GRAFTS ANGIOGRAPHY Left 06/09/2023   Procedure: LEFT HEART CATH AND CORS/GRAFTS ANGIOGRAPHY;  Surgeon: Marcina Millard, MD;  Location: [facility];  Service: Cardiovascular;  Laterality: Left;   METATARSAL HEAD EXCISION Right 03/21/2020   Procedure: METATARSAL HEAD RESECTION RIGHT AND DEBRIDEMENT OF ULCER;  Surgeon: Felecia Shelling, DPM;  Location: [facility] OR;  Service: Podiatry;  Laterality: Right;   MULTIPLE TOOTH EXTRACTIONS     TEE WITHOUT CARDIOVERSION N/A 06/27/2016   Procedure: TRANSESOPHAGEAL ECHOCARDIOGRAM (TEE);  Surgeon: Kerin Perna, MD;  Location: [facility] OR;  Service: Open Heart Surgery;  Laterality: N/A;    Allergies  Allergen Reactions   Nitroglycerin Nausea Only and Other (See Comments)    Patches only -  headache    Statins Other (See Comments)    BODY PAIN, Pt taking Crestor*      Fluvastatin Other (See Comments)    Other reaction(s): Muscle pain   Fluvastatin Sodium     Other reaction(s): Myalgias   Rosuvastatin Other (See Comments)    Other reaction(s): Muscle pain, Myalgias   Simvastatin Other (See Comments)    Other reaction(s): Muscle pain, Myalgias   Ezetimibe Other (See Comments)    Other reaction(s): Muscle Pain   Lipitor [Atorvastatin] Other (See Comments) and Rash    BODY PAIN   Lisinopril Other (See Comments) and Nausea Only    Light headed, dizzy Other reaction(s): Dizziness    LT foot 08/07/2023   LT foot 08/14/2023  LT foot 08/14/2023  Objective/Physical Exam Neurovascular status intact.  The amputation site remains nicely healed.   Ulcer to the plantar aspect of the fifth MTP left likely secondary to pressure.  Measures approximately 1.5 x 1.5 x 0.1 cm.  Granular wound base.  The preulcerative callus lesion to the distal tip of the left great toe appears stable.  Radiographic Exam B/L feet 07/03/2023:  Interval amputation of the second and third digits left foot as well as the great toe and second digit to the right foot with resection of the fifth metatarsal head as well.  Clean osteotomies.  No gas within the tissue.  Assessment: 1. s/p amputation left third and right second. DOS: 06/26/2023; healed 2. PSxHx amputation RT great toe and 5th met head resection. LT second. 3.  Diabetes mellitus with peripheral polyneuropathy 4.  Ulcer plantar aspect of the fifth MTP left  Plan of Care:  -Patient was evaluated.   -Diabetic shoes pending.  Order placed for diabetic shoes with custom molded Plastizote insoles on 08/07/2023. -Medically necessary excisional debridement including subcutaneous tissue was performed today using a tissue nipper.  Excisional debridement of the necrotic nonviable tissue down to healthier bleeding viable tissue was performed with  postdebridement measurement same as pre- -Patient has gentamicin cream at home.  Continue to apply daily - The patient has a history of fifth metatarsal head resection to the right foot which alleviated his ulcer to the plantar aspect of the fifth MTP.  He has not had any trouble or problems since then.  He is also demonstrating good healing postoperatively.  I do believe it would be appropriate for the patient at this time to consider fifth metatarsal head resection to the left foot.  Unfortunately the patient has a history of rapid deterioration of wounds and ulcers that developed osteomyelitis rather quickly.  The procedure was explained in detail to the patient.  No guarantees were expressed or implied.  Risk benefits advantages disadvantages of the procedure were explained.  All patient questions were answered.  After discussion with the patient he would like to proceed with fifth metatarsal head resection of the  left foot -Authorization for surgery was initiated today.  Surgery will consist of fifth metatarsal head resection left.  Debridement of ulcer with possible application of wound graft left -Return to clinic 1 week postop  Felecia Shelling, DPM [facility]  Dr. Felecia Shelling, DPM    [address] [place], [place] [address]                Office [phone]  Fax [phone]

## 2023-08-28 NOTE — Telephone Encounter (Signed)
Pt has not been sent by Jae Dire since 08/2022. He will need an OV before this form can be filled out. Attempted to contact pt to make this appointment but there was no answer and I could not leave a message. Forms have been left in Kate's in box by [employer].

## 2023-08-28 NOTE — Telephone Encounter (Signed)
Type of forms received: [facility]   Routed to: [employer] [facility]   Paperwork received by : Audree Camel    Individual made aware of 3-5 business day turn around (Y/N): Y   Form completed and patient made aware of charges(Y/N): Y    Faxed to : pt requested a call back @ [phone] once ppw is comp   Form location:  [facility] folder

## 2023-08-28 NOTE — Telephone Encounter (Signed)
Spoke to pt, scheduled ov for 09/01/23

## 2023-09-01 ENCOUNTER — Encounter: Payer: Self-pay | Admitting: Primary Care

## 2023-09-01 ENCOUNTER — Ambulatory Visit (INDEPENDENT_AMBULATORY_CARE_PROVIDER_SITE_OTHER): Payer: Medicare Other | Admitting: Primary Care

## 2023-09-01 ENCOUNTER — Other Ambulatory Visit: Payer: Self-pay | Admitting: Student

## 2023-09-01 VITALS — BP 126/62 | HR 70 | Temp 98.2°F | Ht 70.0 in | Wt 295.0 lb

## 2023-09-01 DIAGNOSIS — L97522 Non-pressure chronic ulcer of other part of left foot with fat layer exposed: Secondary | ICD-10-CM | POA: Diagnosis not present

## 2023-09-01 DIAGNOSIS — R42 Dizziness and giddiness: Secondary | ICD-10-CM

## 2023-09-01 DIAGNOSIS — E1065 Type 1 diabetes mellitus with hyperglycemia: Secondary | ICD-10-CM

## 2023-09-01 LAB — POCT GLYCOSYLATED HEMOGLOBIN (HGB A1C): Hemoglobin A1C: 7.6 % — AB (ref 4.0–5.6)

## 2023-09-01 NOTE — Progress Notes (Signed)
Subjective:    Patient ID: Lawrence [name], male    DOB: 1951-02-05, 72 y.o.   MRN: [id number]  HPI  Lawrence [name] is a very pleasant 72 y.o. male with a significant medical history including CAD, hypertension, bilateral carotid artery stenosis, OSA, type 1 diabetes, osteoarthritis, CKD, lower extremity edema, chronic left foot ulcer who presents today for surgical clearance.   He is pending surgery for removal of left 5th metatarsal bone which is painful and contributing to his chronic left foot ulcer. He is pending a surgery date. He completed hyperbaric treatment this Summer for chronic bilateral foot ulcers.   He does follow with cardiology, last office visit was in August 2024, he has an appointment scheduled for next week. No changes made to his regimen during his visit in August. He denies recent chest pain, increased shortness of breath. His last A1C was 8.2 in August 2024, follows with endocrinology through the [place].   He has undergone several foot surgeries in the past with most recent amputation to the left third toe and right second toe in August 2024.   Review of Systems  HENT:  Negative for congestion.   Eyes:  Negative for visual disturbance.  Respiratory:  Negative for shortness of breath.   Cardiovascular:  Negative for chest pain.  Gastrointestinal:  Negative for constipation and diarrhea.  Musculoskeletal:  Positive for arthralgias.  Skin:  Positive for wound.  Neurological:  Negative for dizziness and headaches.         Past Medical History:  Diagnosis Date   Allergies    Anginal pain (HCC)    Bronchitis    PMH   CAD (coronary artery disease)    07/2007   Chronic kidney disease    stage 3   Diabetes (HCC)    Dyspnea    Elevated lipids    GERD (gastroesophageal reflux disease)    Glaucoma    HBP (high blood pressure)    Headache    MRSA (methicillin resistant staph aureus) culture positive    2006 - 2008   Myocardial infarction [facility])    Neuropathy     Osteoarthritis    PAD (peripheral artery disease) (HCC)    a. 03/2022 Lower Ext Angio: No signif Ao-iliac dzs. R PT diff dzs and occluded above ankle. R Pedal arch intact w/ excellent antegrade flow provided by R AT-->no indication for revasc.   Skin cancer    Skin ulcer of right great toe, unspecified ulcer stage (HCC)    Sleep apnea    CPAP   Wears glasses    Wears partial dentures    top    Social History   Socioeconomic History   Marital status: Married    Spouse name: [name],Lawrence   Number of children: Not on file   Years of education: Not on file   Highest education level: Not on file  Occupational History   Not on file  Tobacco Use   Smoking status: Former    Current packs/day: 0.00    Average packs/day: 1 pack/day for 16.7 years (16.7 ttl pk-yrs)    Types: Cigarettes    Start date: 11/03/1969    Quit date: 07/22/1986    Years since quitting: 37.1   Smokeless tobacco: Never  Vaping Use   Vaping status: Never Used  Substance and Sexual Activity   Alcohol use: Not Currently   Drug use: No   Sexual activity: Never  Other Topics Concern   Not on file  Social History Narrative   Married.  Lives at home with wife.    Social Determinants of Health   Financial Resource Strain: Low Risk  (12/09/2022)   Overall Financial Resource Strain (CARDIA)    Difficulty of Paying Living Expenses: Not hard at all  Food Insecurity: No Food Insecurity (12/09/2022)   Hunger Vital Sign    Worried About Running Out of Food in the Last Year: Never true    Ran Out of Food in the Last Year: Never true  Transportation Needs: No Transportation Needs (12/09/2022)   PRAPARE - Administrator, Civil Service (Medical): No    Lack of Transportation (Non-Medical): No  Physical Activity: Inactive (10/15/2022)   Exercise Vital Sign    Days of Exercise per Week: 0 days    Minutes of Exercise per Session: 0 min  Stress: No Stress Concern Present (10/15/2022)   [employer] of  Occupational Health - Occupational Stress Questionnaire    Feeling of Stress : Not at all  Social Connections: Moderately Integrated (10/15/2022)   Social Connection and Isolation Panel [NHANES]    Frequency of Communication with Friends and Family: More than three times a week    Frequency of Social Gatherings with Friends and Family: Twice a week    Attends Religious Services: 1 to 4 times per year    Active Member of [employer] or Organizations: No    Attends Banker Meetings: Never    Marital Status: Married  Catering manager Violence: Not At Risk (10/15/2022)   Humiliation, Afraid, Rape, and Kick questionnaire    Fear of Current or Ex-Partner: No    Emotionally Abused: No    Physically Abused: No    Sexually Abused: No    Past Surgical History:  Procedure Laterality Date   ABDOMINAL AORTOGRAM W/LOWER EXTREMITY N/A 03/26/2022   Procedure: ABDOMINAL AORTOGRAM W/LOWER EXTREMITY;  Surgeon: Iran Ouch, MD;  [facility];  Service: Cardiovascular;  Laterality: N/A;   AMPUTATION TOE Bilateral 06/26/2023   Procedure: AMPUTATION TOE MPJ OF THE JOINT,PARTIAL TOE AMPUTUATION;  Surgeon: Felecia Shelling, DPM;  Location: [facility] ORS;  Service: Orthopedics/Podiatry;  Laterality: Bilateral;   APPENDECTOMY     CARDIAC CATHETERIZATION N/A 06/11/2016   Procedure: Left Heart Cath and Coronary Angiography;  Surgeon: Lamar Blinks, MD;  Location: [facility];  Service: Cardiovascular;  Laterality: N/A;   CARPAL TUNNEL RELEASE Left 04/24/2016   Procedure: CARPAL TUNNEL RELEASE;  Surgeon: Kennedy Bucker, MD;  Location: [facility] ORS;  Service: Orthopedics;  Laterality: Left;   CATARACT EXTRACTION W/ INTRAOCULAR LENS  IMPLANT, BILATERAL Bilateral    CATARACT EXTRACTION, BILATERAL     R eye 07/16/12, L eye 08/13/12 - with lens implant in both eyes   CORONARY ARTERY BYPASS GRAFT N/A 06/27/2016   Procedure: CORONARY ARTERY BYPASS GRAFTING times four using left internal mammary  artery and right leg saphenous vein;  Surgeon: Kerin Perna, MD;  Location: [facility] OR;  Service: Open Heart Surgery;  Laterality: N/A;   EYE SURGERY Bilateral    JOINT REPLACEMENT     KNEE ARTHROPLASTY Left 02/11/2016   Procedure: COMPUTER ASSISTED TOTAL KNEE ARTHROPLASTY;  Surgeon: Donato Heinz, MD;  Location: [facility] ORS;  Service: Orthopedics;  Laterality: Left;   KNEE ARTHROSCOPY Right 2000   LEFT HEART CATH AND CORS/GRAFTS ANGIOGRAPHY Left 01/13/2017   Procedure: Left Heart Cath and Cors/Grafts Angiography;  Surgeon: Lamar Blinks, MD;  Location: [facility];  Service: Cardiovascular;  Laterality: Left;   LEFT HEART CATH AND CORS/GRAFTS ANGIOGRAPHY Left 06/09/2023   Procedure: LEFT HEART CATH AND CORS/GRAFTS ANGIOGRAPHY;  Surgeon: Marcina Millard, MD;  Location: [facility];  Service: Cardiovascular;  Laterality: Left;   METATARSAL HEAD EXCISION Right 03/21/2020   Procedure: METATARSAL HEAD RESECTION RIGHT AND DEBRIDEMENT OF ULCER;  Surgeon: Felecia Shelling, DPM;  Location: [facility] OR;  Service: Podiatry;  Laterality: Right;   MULTIPLE TOOTH EXTRACTIONS     TEE WITHOUT CARDIOVERSION N/A 06/27/2016   Procedure: TRANSESOPHAGEAL ECHOCARDIOGRAM (TEE);  Surgeon: Kerin Perna, MD;  [facility];  Service: Open Heart Surgery;  Laterality: N/A;    Family History  Problem Relation Age of Onset   Hypertension Mother    Lupus Mother    Heart Problems Mother    Hypertension Father    Diabetes Father    Heart attack Father 54       died in his 57s   Drug abuse Brother    Heart disease Brother    Heart attack Maternal Grandmother    Arthritis Maternal Grandmother    Arthritis Maternal Grandfather    Heart attack Paternal Grandmother    Arthritis Paternal Grandmother    Arthritis Paternal Grandfather     Allergies  Allergen Reactions   Nitroglycerin Nausea Only and Other (See Comments)    Patches only - headache    Statins Other (See Comments)    BODY PAIN, Pt  taking Crestor*      Fluvastatin Other (See Comments)    Other reaction(s): Muscle pain   Fluvastatin Sodium     Other reaction(s): Myalgias   Rosuvastatin Other (See Comments)    Other reaction(s): Muscle pain, Myalgias   Simvastatin Other (See Comments)    Other reaction(s): Muscle pain, Myalgias   Ezetimibe Other (See Comments)    Other reaction(s): Muscle Pain   Lipitor [Atorvastatin] Other (See Comments) and Rash    BODY PAIN   Lisinopril Other (See Comments) and Nausea Only    Light headed, dizzy Other reaction(s): Dizziness    Current Outpatient Medications on File Prior to Visit  Medication Sig Dispense Refill   acetaminophen (TYLENOL) 500 MG tablet Take 1,000 mg by mouth every 6 (six) hours as needed.     AIMOVIG 140 MG/ML SOAJ Inject into the skin.     albuterol (VENTOLIN HFA) 108 (90 Base) MCG/ACT inhaler Inhale 2 puffs into the lungs every 4 (four) hours as needed for wheezing. 1 each 0   aspirin 81 MG EC tablet Take 81 mg by mouth daily. Swallow whole.     cholecalciferol (VITAMIN D) 1000 units tablet Take 2,000 Units by mouth in the morning and at bedtime.      clopidogrel (PLAVIX) 75 MG tablet Take 1 tablet (75 mg total) by mouth daily. 30 tablet 0   doxycycline (VIBRA-TABS) 100 MG tablet Take 1 tablet (100 mg total) by mouth 2 (two) times daily. 20 tablet 0   famotidine (PEPCID) 20 MG tablet Take 1 tablet (20 mg total) by mouth daily. 30 tablet 0   fluticasone (FLONASE) 50 MCG/ACT nasal spray Place 1 spray into both nostrils daily as needed for allergies.     furosemide (LASIX) 40 MG tablet Take 1 tablet (40 mg total) by mouth daily. 30 tablet 1   glucagon 1 MG injection Inject 1 mg into the vein once as needed. Reported on 02/11/2016     GLUTOSE 15 40 % GEL 1 Tube once as  needed for low blood sugar.     insulin aspart (NOVOLOG) 100 UNIT/ML injection Inject 20 Units into the skin 3 (three) times daily. (Patient taking differently: Inject 8-20 Units into the skin 3  (three) times daily with meals. Sliding Scale) 10 mL 5   insulin glargine-yfgn (SEMGLEE) 100 UNIT/ML injection 20 Units at bedtime.     isosorbide mononitrate (IMDUR) 60 MG 24 hr tablet Take 60 mg by mouth in the morning and at bedtime.     loratadine (CLARITIN) 10 MG tablet Take 10 mg by mouth daily.     nitroGLYCERIN (NITROSTAT) 0.4 MG SL tablet DISSOLVE 1 TABLET UNDER THE TONGUE EVERY 5 MINUTES AS NEEDED FOR CHEST PAIN 30 tablet 0   potassium chloride SA (K-DUR,KLOR-CON) 20 MEQ tablet Take 1 tablet (20 mEq total) by mouth daily. 30 tablet 1   rosuvastatin (CRESTOR) 20 MG tablet Take 1 tablet (20 mg total) by mouth daily at 6 PM. 30 tablet 1   Travoprost, BAK Free, (TRAVATAN) 0.004 % SOLN ophthalmic solution Place 1 drop into both eyes at bedtime.      vitamin C (ASCORBIC ACID) 500 MG tablet Take 500 mg by mouth 2 (two) times daily.      No current facility-administered medications on file prior to visit.    BP 126/62   Pulse 70   Temp 98.2 F (36.8 C) (Temporal)   Ht 5\' 10"  (1.778 m)   Wt 295 lb (133.8 kg)   SpO2 98%   BMI 42.33 kg/m  Objective:   Physical Exam HENT:     Right Ear: Tympanic membrane and ear canal normal.     Left Ear: Tympanic membrane and ear canal normal.  Eyes:     Pupils: Pupils are equal, round, and reactive to light.  Cardiovascular:     Rate and Rhythm: Normal rate and regular rhythm.  Pulmonary:     Effort: Pulmonary effort is normal.     Breath sounds: Normal breath sounds.  Abdominal:     General: Bowel sounds are normal.     Palpations: Abdomen is soft.     Tenderness: There is no abdominal tenderness.  Musculoskeletal:        General: Normal range of motion.     Cervical back: Neck supple.  Skin:    General: Skin is warm and dry.  Neurological:     Mental Status: He is alert and oriented to person, place, and time.     Cranial Nerves: No cranial nerve deficit.     Deep Tendon Reflexes:     Reflex Scores:      Patellar reflexes are 2+ on  the right side and 2+ on the left side. Psychiatric:        Mood and Affect: Mood normal.           Assessment & Plan:  Type 1 diabetes mellitus with hyperglycemia (HCC) Assessment & Plan: Improved with A1C of 7.6 today.  Continue insulin pump per endocrinology.  Orders: -     POCT glycosylated hemoglobin (Hb A1C)  Ulcer of left foot with fat layer exposed [facility]) Assessment & Plan: Following with podiatry, office notes reviewed from October 2024. Form completed today.  A1C improved to 7.6 today, cleared from medical standpoint. Recommended cardiology clearance. Patient will see cardiology next week.  Reviewed cardiology notes from August 2024 through [facility].            Doreene Nest, NP

## 2023-09-01 NOTE — Patient Instructions (Signed)
Visit your cardiologist next week for surgical clearance.   It was a pleasure to see you today!

## 2023-09-01 NOTE — Assessment & Plan Note (Addendum)
Following with podiatry, office notes reviewed from October 2024. Form completed today.  A1C improved to 7.6 today, cleared from medical standpoint. Recommended cardiology clearance. Patient will see cardiology next week.  Reviewed cardiology notes from August 2024 through [facility].

## 2023-09-01 NOTE — Assessment & Plan Note (Signed)
Improved with A1C of 7.6 today.  Continue insulin pump per endocrinology.

## 2023-09-04 ENCOUNTER — Ambulatory Visit: Payer: Medicare Other

## 2023-09-04 DIAGNOSIS — L97512 Non-pressure chronic ulcer of other part of right foot with fat layer exposed: Secondary | ICD-10-CM

## 2023-09-04 DIAGNOSIS — E1351 Other specified diabetes mellitus with diabetic peripheral angiopathy without gangrene: Secondary | ICD-10-CM

## 2023-09-04 DIAGNOSIS — E0843 Diabetes mellitus due to underlying condition with diabetic autonomic (poly)neuropathy: Secondary | ICD-10-CM

## 2023-09-04 DIAGNOSIS — M2041 Other hammer toe(s) (acquired), right foot: Secondary | ICD-10-CM

## 2023-09-04 DIAGNOSIS — S98132S Complete traumatic amputation of one left lesser toe, sequela: Secondary | ICD-10-CM

## 2023-09-08 ENCOUNTER — Encounter
Admission: RE | Admit: 2023-09-08 | Discharge: 2023-09-08 | Disposition: A | Discharge status: Home / Self Care | Payer: Medicare Other | Source: Ambulatory Visit | Attending: Podiatry | Admitting: Podiatry

## 2023-09-08 ENCOUNTER — Other Ambulatory Visit: Payer: Self-pay

## 2023-09-08 VITALS — Ht 70.5 in | Wt 290.0 lb

## 2023-09-08 DIAGNOSIS — I25119 Atherosclerotic heart disease of native coronary artery with unspecified angina pectoris: Secondary | ICD-10-CM

## 2023-09-08 DIAGNOSIS — E1065 Type 1 diabetes mellitus with hyperglycemia: Secondary | ICD-10-CM

## 2023-09-08 DIAGNOSIS — I252 Old myocardial infarction: Secondary | ICD-10-CM

## 2023-09-08 DIAGNOSIS — E785 Hyperlipidemia, unspecified: Secondary | ICD-10-CM

## 2023-09-08 DIAGNOSIS — N18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, stage 3a: Secondary | ICD-10-CM

## 2023-09-08 HISTORY — DX: Occlusion and stenosis of unspecified carotid artery: I65.29

## 2023-09-08 NOTE — Patient Instructions (Signed)
Your procedure is scheduled on: Tuesday 09/15/23 To find out your arrival time, please call [phone] between 1PM - 3PM on:   Monday 09/14/23 Report to the Registration Desk on the 1st floor of the [facility]. Free Valet parking is available.  If your arrival time is 6:00 am, do not arrive before that time as the [facility] entrance doors do not open until 6:00 am.  REMEMBER: Instructions that are not followed completely may result in serious medical risk, up to and including death; or upon the discretion of your surgeon and anesthesiologist your surgery may need to be rescheduled.  Do not eat food after midnight the night before surgery.  No gum chewing or hard candies.  You may however, drink CLEAR liquids up to 2 hours before you are scheduled to arrive for your surgery. Do not drink anything within 2 hours of your scheduled arrival time.  Clear liquids include: - water   Type 1 and Type 2 diabetics should only drink water.  One week prior to surgery: Stop Anti-inflammatories (NSAIDS) such as Advil, Aleve, Ibuprofen, Motrin, Naproxen, Naprosyn and Aspirin based products such as Excedrin, Goody's Powder, BC Powder. You may however, continue to take Tylenol if needed for pain up until the day of surgery.  Stop ANY OVER THE COUNTER supplements and vitamins Today 09/08/23 until after surgery.  Continue taking all prescribed medications with the exception of the following: PLAVIX, please follow up with cardiology regarding ability to hold your blood thinner. GLARNINE insulin, decrease your nighttime dose from 20 units to 10 units Monday night 09/14/23  TAKE ONLY THESE MEDICATIONS THE MORNING OF SURGERY WITH A SIP OF WATER:  carvedilol (COREG) 12.5 MG tablet  famotidine (PEPCID) 20 MG tablet Antacid (take one the night before and one on the morning of surgery - helps to prevent nausea after surgery.) isosorbide mononitrate (IMDUR) 60 MG 24 hr tablet   Use inhalers on the day of  surgery and bring to the hospital.  No Alcohol for 24 hours before or after surgery.  No Smoking including e-cigarettes for 24 hours before surgery.  No chewable tobacco products for at least 6 hours before surgery.  No nicotine patches on the day of surgery.  Do not use any "recreational" drugs for at least a week (preferably 2 weeks) before your surgery.  Please be advised that the combination of cocaine and anesthesia may have negative outcomes, up to and including death. If you test positive for cocaine, your surgery will be cancelled.  On the morning of surgery brush your teeth with toothpaste and water, you may rinse your mouth with mouthwash if you wish. Do not swallow any toothpaste or mouthwash.  Use CHG Soap or wipes as directed on instruction sheet.  Do not wear lotions, powders, or perfumes.   Do not shave body hair from the neck down 48 hours before surgery.  Wear comfortable clothing (specific to your surgery type) to the hospital.  Do not wear jewelry, make-up, hairpins, clips or nail polish.  For welded (permanent) jewelry: bracelets, anklets, waist bands, etc.  Please have this removed prior to surgery.  If it is not removed, there is a chance that hospital personnel will need to cut it off on the day of surgery. Contact lenses, hearing aids and dentures may not be worn into surgery.  Bring your C-PAP to the hospital in case you may have to spend the night.   Do not bring valuables to the hospital. [facility] is  not responsible for any missing/lost belongings or valuables.   Notify your doctor if there is any change in your medical condition (cold, fever, infection).  If you are being discharged the day of surgery, you will not be allowed to drive home. You will need a responsible individual to drive you home and stay with you for 24 hours after surgery.   If you are taking public transportation, you will need to have a responsible individual with you.  If you  are being admitted to the hospital overnight, leave your suitcase in the car. After surgery it may be brought to your room.  In case of increased patient census, it may be necessary for you, the patient, to continue your postoperative care in the Same Day Surgery department.  After surgery, you can help prevent lung complications by doing breathing exercises.  Take deep breaths and cough every 1-2 hours. Your doctor may order a device called an Incentive Spirometer to help you take deep breaths. When coughing or sneezing, hold a pillow firmly against your incision with both hands. This is called "splinting." Doing this helps protect your incision. It also decreases belly discomfort.  Surgery Visitation Policy:  Patients undergoing a surgery or procedure may have two family members or support persons with them as long as the person is not COVID-19 positive or experiencing its symptoms.   Inpatient Visitation:    Visiting hours are 7 a.m. to 8 p.m. Up to four visitors are allowed at one time in a patient room. The visitors may rotate out with other people during the day. One designated support person (adult) may remain overnight.  Please call the Pre-admissions Testing Dept. at [phone] if you have any questions about these instructions.     Preparing for Surgery with CHLORHEXIDINE GLUCONATE (CHG) Soap  Chlorhexidine Gluconate (CHG) Soap  o An antiseptic cleaner that kills germs and bonds with the skin to continue killing germs even after washing  o Used for showering the night before surgery and morning of surgery  Before surgery, you can play an important role by reducing the number of germs on your skin.  CHG (Chlorhexidine gluconate) soap is an antiseptic cleanser which kills germs and bonds with the skin to continue killing germs even after washing.  Please do not use if you have an allergy to CHG or antibacterial soaps. If your skin becomes reddened/irritated stop using the  CHG.  1. Shower the NIGHT BEFORE SURGERY and the MORNING OF SURGERY with CHG soap.  2. If you choose to wash your hair, wash your hair first as usual with your normal shampoo.  3. After shampooing, rinse your hair and body thoroughly to remove the shampoo.  4. Use CHG as you would any other liquid soap. You can apply CHG directly to the skin and wash gently with a scrungie or a clean washcloth.  5. Apply the CHG soap to your body only from the neck down. Do not use on open wounds or open sores. Avoid contact with your eyes, ears, mouth, and genitals (private parts). Wash face and genitals (private parts) with your normal soap.  6. Wash thoroughly, paying special attention to the area where your surgery will be performed.  7. Thoroughly rinse your body with warm water.  8. Do not shower/wash with your normal soap after using and rinsing off the CHG soap.  9. Pat yourself dry with a clean towel.  10. Wear clean pajamas to bed the night before surgery.  12.  Place clean sheets on your bed the night of your first shower and do not sleep with pets.  13. Shower again with the CHG soap on the day of surgery prior to arriving at the hospital.  14. Do not apply any deodorants/lotions/powders.  15. Please wear clean clothes to the hospital.

## 2023-09-09 ENCOUNTER — Inpatient Hospital Stay
Admission: RE | Admit: 2023-09-09 | Discharge: 2023-09-09 | Discharge status: Home / Self Care | Payer: Medicare Other | Source: Ambulatory Visit | Attending: Podiatry

## 2023-09-09 ENCOUNTER — Ambulatory Visit
Admission: RE | Admit: 2023-09-09 | Discharge: 2023-09-09 | Disposition: A | Discharge status: Home / Self Care | Payer: Medicare Other | Source: Ambulatory Visit | Attending: Student | Admitting: Student

## 2023-09-09 DIAGNOSIS — R42 Dizziness and giddiness: Secondary | ICD-10-CM | POA: Insufficient documentation

## 2023-09-09 DIAGNOSIS — N18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, stage 3a: Secondary | ICD-10-CM

## 2023-09-09 DIAGNOSIS — E1022 Type 1 diabetes mellitus with diabetic chronic kidney disease: Secondary | ICD-10-CM | POA: Insufficient documentation

## 2023-09-09 DIAGNOSIS — I25119 Atherosclerotic heart disease of native coronary artery with unspecified angina pectoris: Secondary | ICD-10-CM

## 2023-09-09 DIAGNOSIS — E785 Hyperlipidemia, unspecified: Secondary | ICD-10-CM

## 2023-09-09 DIAGNOSIS — E1065 Type 1 diabetes mellitus with hyperglycemia: Secondary | ICD-10-CM

## 2023-09-09 DIAGNOSIS — Z01812 Encounter for preprocedural laboratory examination: Secondary | ICD-10-CM | POA: Insufficient documentation

## 2023-09-09 DIAGNOSIS — I252 Old myocardial infarction: Secondary | ICD-10-CM | POA: Insufficient documentation

## 2023-09-09 LAB — CBC
HCT: 36.9 % — ABNORMAL LOW (ref 39.0–52.0)
Hemoglobin: 12.6 g/dL — ABNORMAL LOW (ref 13.0–17.0)
MCH: 31.7 pg (ref 26.0–34.0)
MCHC: 34.1 g/dL (ref 30.0–36.0)
MCV: 92.9 fL (ref 80.0–100.0)
Platelets: 191 10*3/uL (ref 150–400)
RBC: 3.97 MIL/uL — ABNORMAL LOW (ref 4.22–5.81)
RDW: 12.1 % (ref 11.5–15.5)
WBC: 7.2 10*3/uL (ref 4.0–10.5)
nRBC: 0 % (ref 0.0–0.2)

## 2023-09-09 LAB — BASIC METABOLIC PANEL
Anion gap: 9 (ref 5–15)
BUN: 23 mg/dL (ref 8–23)
CO2: 28 mmol/L (ref 22–32)
Calcium: 8.6 mg/dL — ABNORMAL LOW (ref 8.9–10.3)
Chloride: 100 mmol/L (ref 98–111)
Creatinine, Ser: 1.09 mg/dL (ref 0.61–1.24)
GFR, Estimated: >60 mL/min (ref >60)
Glucose, Bld: 147 mg/dL — ABNORMAL HIGH (ref 70–99)
Potassium: 3.7 mmol/L (ref 3.5–5.1)
Sodium: 137 mmol/L (ref 135–145)

## 2023-09-10 NOTE — Progress Notes (Signed)
Patient presents to the office today for diabetic shoe and insole measuring.  Patient was measured with brannock device to determine size and width for 1 pair of extra depth shoes and foam casted for 3ea Left custom inserts and Right TF   Documentation of medical necessity will be sent to patient's treating diabetic doctor to verify and sign.   Patient's diabetic provider: [name] NP   Shoes and insoles will be ordered at that time and patient will be notified for an appointment for fitting when they arrive.   Shoe size (per patient): 12 Brannock measurement: 12 Patient shoe selection- Shoe choice:   [id number]  Shoe size ordered: [id number]

## 2023-09-13 ENCOUNTER — Encounter: Payer: Self-pay | Admitting: Podiatry

## 2023-09-14 ENCOUNTER — Encounter: Payer: Self-pay | Admitting: Podiatry

## 2023-09-14 NOTE — Progress Notes (Signed)
Perioperative / Anesthesia Services  Pre-Admission Testing Clinical Review / Pre-Operative Anesthesia Consult  Date: 09/14/23  Patient Demographics:  Name: Lawrence [name] DOB:   [birth year] MRN:   [id number]  Planned Surgical Procedure(s):    Case: [id number] Date/Time: 09/15/23 1112   Procedure: METATARSAL HEAD EXCISION (Left: Toe)   Anesthesia type: Choice   Pre-op diagnosis: ULCER   Location: [facility] OR ROOM 09 / [facility] ORS FOR ANESTHESIA GROUP   Surgeons: Felecia Shelling, DPM     NOTE: Available PAT nursing documentation and vital signs have been reviewed. Clinical nursing staff has updated patient's PMH/PSHx, current medication list, and drug allergies/intolerances to ensure comprehensive history available to assist in medical decision making as it pertains to the aforementioned surgical procedure and anticipated anesthetic course. Extensive review of available clinical information personally performed. [place] [facility] and PSHx updated with any diagnoses/procedures that  may have been inadvertently omitted during his intake with the pre-admission testing department's nursing staff.  Clinical Discussion:  Lawrence [name] is a 72 y.o. male who is submitted for pre-surgical anesthesia review and clearance prior to him undergoing the above procedure. Patient is a Former Smoker (16.7 pack years; quit 07/1986). Pertinent PMH includes: CAD (s/p CABG), NSTEMI, diastolic dysfunction, PAD, angina, PAD, BILATERAL carotid artery disease, HTN, HLD, T2DM, CKD-III, dyspnea, OSAH (requires nocturnal PAP therapy), GERD (on daily H2 blocker), OA.  Patient is followed by cardiology Lawrence Junker, MD). He was last seen in the cardiology clinic on 09/08/2023; notes reviewed. At the time of his clinic visit, patient complaining of intermittent episodes of chest discomfort.  Symptom improved with recent increased episode of long-acting nitrate (isosorbide).  Patient denied any chest pain, shortness of breath, PND,  orthopnea, palpitations, significant peripheral edema, weakness, fatigue, vertiginous symptoms, or presyncope/syncope. Patient with a past medical history significant for cardiovascular diagnoses. Documented physical exam was grossly benign, providing no evidence of acute exacerbation and/or decompensation of the patient's known cardiovascular conditions.  Patient suffered an NSTEMI on 07/27/2007.  Subsequent diagnostic LEFT heart catheterization was performed on 07/30/2007 revealing multivessel CAD; 30% mid LAD, 99% D1, 50% proximal LCx, 40% mid LCx, 75% distal LCx-1, 75% distal LCx-2, 85% distal LCx-3, 50% OM1, 75% OM2-1, 80% OM2-2, 80% OM2-3, 50% proximal RCA, 60% mid RCA, and 85% distal RCA.  Interventional cardiology noted that patient with significant RCA disease that was not amenable to PCI.  Intensified medical management was recommended.  Diagnostic LEFT heart catheterization was performed on 06/11/2016 revealing multivessel CAD; 95% distal LAD-1, 95% distal LAD-2, 40% mid LAD, 65% ostial LCx, 90% OM1, 90% OM2, 80% distal LCx, 70% LM, 70% proximal LAD, 65% proximal RCA-1, 95% proximal RCA-2, 45% mid RCA, and 60% distal RCA.  Given the degree and complexity of patient's coronary artery disease, patient was referred to CVTS for consideration of revascularization procedure.  Patient underwent four-vessel revascularization on 06/27/2016.  LIMA-LAD, SVG-RCA, and sequential SVG-OM1-OM2 bypass grafts were placed.  Patient with recurrent anginal symptoms.  He underwent repeat diagnostic LEFT heart catheterization on 01/13/2017 revealing multivessel CAD; 95% distal LAD-1, 95% distal LAD-2, 40% mid LAD, 65% ostial LCx, 90% OM1, 90% OM 2, 80% distal LCx, 70% LM, 70% proximal LAD, 65% proximal RCA-1, 95% proximal RCA-2, 100% mid RCA-1, 45% mid RCA-2, 100% distal RCA-1, and 60% distal RCA-2.  LIMA-LAD and SVG-OM1 bypass grafts were placed.  SVG-RCA was occluded and not felt to be amenable to PCI.  Intensified  medical management was recommended.  Most recent TTE was performed on 07/22/2019  revealing a normal left ventricular systolic function with an EF of 55 to 60%.  There was mild LVH.  Diastolic Doppler parameters were normal.  Theretrivial mitral trivial mitral and mild tricuspid valve regurgitation.  All transvalvular gradients were noted to be normal providing no evidence suggestive of valvular stenosis.  Aorta normal in size with no evidence of aneurysmal dilatation.  Most recent myocardial perfusion imaging study was performed on 05/19/2023 revealing a normal left ventricular systolic function with a hyperdynamic LVEF of 69%.  There was a moderate mixed perfusion abnormality in the anterior lateral and apical regions with borderline evidence of redistribution.  Findings possibly consistent with ischemia with mixed redistribution.  Study determined to be moderate to high risk with equivocal findings for possible ischemia.  Repeat cardiac catheterization was recommended.  Most recent diagnostic LEFT heart catheterization was performed on 06/09/2023 revealing multivessel CAD; 75% mid LM, 80% ostial to proximal LAD, 60% mid LAD, 100% proximal mid LCx, 100% OM1, 95% proximal RCA, 100% mid RCA, and 50% mid to distal LAD.  Again, LIMA-LAD and SVG-OM1 bypass grafts were patent.  There was CTO of the SVG-PDA graft with adequate LEFT to RIGHT collateral formation.  Further intervention was deferred opting for aggressive medical management.  Given patient's significant cardiovascular disease, he remains on daily DAPT therapy using ASA + clopidogrel.  Patient is reportedly compliant with therapy with no evidence or reports of GI/GU related bleeding.  Blood pressure well controlled at 90/60 mmHg on currently prescribed beta-blocker (carvedilol), diuretic (furosemide), and nitrate (isosorbide mononitrate) therapies.  In addition to his scheduled nitrates, patient has a supply of short acting nitrates (NTG) to use on a  as needed basis for recurrent angina/anginal equivalent symptoms; denied recent use.  Patient is on rosuvastatin for his HLD diagnosis and ASCVD prevention. T2DM loosely controlled on currently prescribed regimen; last HgbA1c was 7.6% when checked on 09/01/2023. He does have an OSAH diagnosis and is reported to be compliant with prescribed nocturnal PAP therapy.  Functional capacity limited by age, arthritides, and multiple medical comorbidities.  With that said, patient felt to be able to achieve all of his ADLs/IADLs independently without significant cardiovascular limitation.  Per the DASI, patient is able to achieve at least 4 METS of physical activity without experiencing any significant degree of angina/anginal equivalent symptoms.  No changes were made to his medication regimen during his visit with cardiology.  Patient scheduled to follow-up with outpatient cardiology in 3 months or sooner if needed.  Lawrence [name] is scheduled for an elective METATARSAL HEAD EXCISION (Left: Toe) on 09/15/2023 with Dr. Sanjuana Letters, DPM.  Given patient's past medical history significant for cardiovascular diagnoses, presurgical cardiac clearance was sought by the PAT team. Per cardiology, "this patient is optimized for surgery and may proceed with the planned procedural course with a MODERATE risk of significant perioperative cardiovascular complications".  Again, this patient is on daily DAPT therapy.  Patient has been instructed on recommendations for holding his clopidogrel for 5 days (last dose 09/09/2023) and his daily low-dose ASA for 2 days (last dose 09/12/2023) prior to his procedure with plans to restart as soon as postoperative bleeding respectively minimized by his primary attending surgeon.    Patient denies previous perioperative complications with anesthesia in the past. In review of the available records, it is noted that patient underwent a general anesthetic course here at [facility] ([facility]) in 06/2023 without documented complications.      09/08/2023  1:57 PM 09/01/2023    3:07 PM 08/14/2023    7:38 AM  Vitals with BMI  Height 5' 10.5" 5\' 10"    Weight 290 lbs 295 lbs   BMI 41.01 42.33   Systolic  126 128  Diastolic  62 54  Pulse  70 64    Providers/Specialists:   NOTE: Primary physician provider listed below. Patient may have been seen by APP or partner within same practice.   PROVIDER ROLE / SPECIALTY LAST Carolanne Grumbling, DPM Podiatry (Surgeon) 08/28/2023  Doreene Nest, NP Primary Care Provider 09/01/2023  Marcina Millard, MD Cardiology 09/08/2023   Allergies:  Latex, Nitroglycerin, Statins, Fluvastatin, Fluvastatin sodium, Rosuvastatin, Simvastatin, Ezetimibe, Lipitor [atorvastatin], and Lisinopril  Current Home Medications:   No current facility-administered medications for this encounter.    acetaminophen (TYLENOL) 500 MG tablet   AIMOVIG 140 MG/ML SOAJ   albuterol (VENTOLIN HFA) 108 (90 Base) MCG/ACT inhaler   aspirin 81 MG EC tablet   CALCIUM PO   carvedilol (COREG) 12.5 MG tablet   Cholecalciferol (VITAMIN D) 50 MCG (2000 UT) tablet   clopidogrel (PLAVIX) 75 MG tablet   doxycycline (VIBRA-TABS) 100 MG tablet   famotidine (PEPCID) 20 MG tablet   fluticasone (FLONASE) 50 MCG/ACT nasal spray   furosemide (LASIX) 40 MG tablet   glucagon 1 MG injection   insulin aspart (NOVOLOG) 100 UNIT/ML injection   insulin glargine-yfgn (SEMGLEE) 100 UNIT/ML injection   isosorbide mononitrate (IMDUR) 60 MG 24 hr tablet   loratadine (CLARITIN) 10 MG tablet   nitroGLYCERIN (NITROSTAT) 0.4 MG SL tablet   potassium chloride SA (K-DUR,KLOR-CON) 20 MEQ tablet   rosuvastatin (CRESTOR) 20 MG tablet   Travoprost, BAK Free, (TRAVATAN) 0.004 % SOLN ophthalmic solution   vitamin C (ASCORBIC ACID) 500 MG tablet   GLUTOSE 15 40 % GEL   History:   Past Medical History:  Diagnosis Date   Allergies    Anginal pain (HCC)     Bilateral carotid artery disease (HCC)    CAD (coronary artery disease) 07/2007   a.) s/p NSTEMI 07/2007 - sig multi-vessel CAD not amenable to PCI; b.) LHC 06/11/2016: multi vessel CAD not amenable to PCI --> refer to CVTS; c.) s/p 4v CABG 06/27/2016; d.) LHC 06/09/2023: 75% mLM, 80% o-pLAD, 60% mLAD, 100% p-mLCx, 100% OM1, 95% pRCA, 100% mRCA, 50% m-dLAD. LIMA-LAD and SVG-OM1 patent. CTO SVG-RCA with L-R collaterals - med mgmt   Carpal tunnel syndrome on left    a.) s/p release 04/2016   CKD (chronic kidney disease), stage III (HCC)    Diabetic peripheral neuropathy (HCC)    Diastolic dysfunction    a.) TTE 06/25/2016: EF 60-654%. no RWMAs, G1DD, mild AoV annular calc; b.) TTE 03/19/2017: EF 50-55%, no RWMAs, mild MR; c.) TTE 07/22/2019: EF 55-60%, midl LVH, no RWMAs, triv MR, mild TR   Dyspnea    GERD (gastroesophageal reflux disease)    Glaucoma    Headache    History of bilateral cataract extraction    HLD (hyperlipidemia)    HTN (hypertension)    Long term current use of clopidogrel    Long-term use of aspirin therapy    MRSA (methicillin resistant staph aureus) culture positive    2006 - 2008   NSTEMI (non-ST elevated myocardial infarction) (HCC) 07/27/2007   a.) LHC 07/30/2007: 30% mLAD, 99% D1, 50% pLCx, 40% mLCx, 70/75/85% dLCx, 50% OM1, 75/80/80 OM2, 50% pRCA, 60% mRCA, 85% dRCA --> not amenable to PCI   OSA on  CPAP    Osteoarthritis    PAD (peripheral artery disease) (HCC)    a. 03/2022 Lower Ext Angio: No signif Ao-iliac dzs. R PT diff dzs and occluded above ankle. R Pedal arch intact w/ excellent antegrade flow provided by R AT-->no indication for revasc.   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   a.) LIMA-LAD, SVG-RCA, seq SVG-OM1-OM2   Skin cancer, basal cell    Skin ulcer of right great toe, unspecified ulcer stage (HCC)    T2DM (type 2 diabetes mellitus) (HCC)    Wears glasses    Wears partial dentures    top   Past Surgical History:  Procedure Laterality Date   ABDOMINAL  AORTOGRAM W/LOWER EXTREMITY N/A 03/26/2022   Procedure: ABDOMINAL AORTOGRAM W/LOWER EXTREMITY;  Surgeon: Iran Ouch, MD;  [facility];  Service: Cardiovascular;  Laterality: N/A;   AMPUTATION TOE Bilateral 06/26/2023   Procedure: AMPUTATION TOE MPJ OF THE JOINT,PARTIAL TOE AMPUTUATION;  Surgeon: Felecia Shelling, DPM;  Location: [facility] ORS;  Service: Orthopedics/Podiatry;  Laterality: Bilateral;   APPENDECTOMY     CARDIAC CATHETERIZATION N/A 06/11/2016   Procedure: Left Heart Cath and Coronary Angiography;  Surgeon: Lamar Blinks, MD;  Location: [facility] INVASIVE CV LAB;  Service: Cardiovascular;  Laterality: N/A;   CARPAL TUNNEL RELEASE Left 04/24/2016   Procedure: CARPAL TUNNEL RELEASE;  Surgeon: Kennedy Bucker, MD;  Location: [facility] ORS;  Service: Orthopedics;  Laterality: Left;   CATARACT EXTRACTION W/ INTRAOCULAR LENS  IMPLANT, BILATERAL Bilateral    CATARACT EXTRACTION, BILATERAL     R eye 07/16/12, L eye 08/13/12 - with lens implant in both eyes   CORONARY ARTERY BYPASS GRAFT N/A 06/27/2016   Procedure: CORONARY ARTERY BYPASS GRAFTING times four using left internal mammary artery and right leg saphenous vein;  Surgeon: Kerin Perna, MD;  Location[facility] OR;  Service: Open Heart Surgery;  Laterality: N/A;   FOREARM FRACTURE SURGERY Left 1961   KNEE ARTHROPLASTY Left 02/11/2016   Procedure: COMPUTER ASSISTED TOTAL KNEE ARTHROPLASTY;  Surgeon: Donato Heinz, MD;  Location: [facility] ORS;  Service: Orthopedics;  Laterality: Left;   KNEE ARTHROSCOPY Right 2000   LEFT HEART CATH AND CORONARY ANGIOGRAPHY Left 07/30/2007   Procedure: LEFT HEART CATH AND CORONARY ANGIOGRAPHY; Location: [facility]; Surgeon: Arnoldo Hooker, MD   LEFT HEART CATH AND CORS/GRAFTS ANGIOGRAPHY Left 01/13/2017   Procedure: Left Heart Cath and Cors/Grafts Angiography;  Surgeon: Lamar Blinks, MD;  Location: [facility] INVASIVE CV LAB;  Service: Cardiovascular;  Laterality: Left;   LEFT HEART CATH AND CORS/GRAFTS  ANGIOGRAPHY Left 06/09/2023   Procedure: LEFT HEART CATH AND CORS/GRAFTS ANGIOGRAPHY;  Surgeon: Marcina Millard, MD;  Location: [facility] INVASIVE CV LAB;  Service: Cardiovascular;  Laterality: Left;   METATARSAL HEAD EXCISION Right 03/21/2020   Procedure: METATARSAL HEAD RESECTION RIGHT AND DEBRIDEMENT OF ULCER;  Surgeon: Felecia Shelling, DPM;  Location: [facility] OR;  Service: Podiatry;  Laterality: Right;   MULTIPLE TOOTH EXTRACTIONS     TEE WITHOUT CARDIOVERSION N/A 06/27/2016   Procedure: TRANSESOPHAGEAL ECHOCARDIOGRAM (TEE);  Surgeon: Kerin Perna, MD;  Location: [facility] OR;  Service: Open Heart Surgery;  Laterality: N/A;   Family History  Problem Relation Age of Onset   Hypertension Mother    Lupus Mother    Heart Problems Mother    Hypertension Father    Diabetes Father    Heart attack Father 89       died in his 31s   Drug abuse Brother  Heart disease Brother    Heart attack Maternal Grandmother    Arthritis Maternal Grandmother    Arthritis Maternal Grandfather    Heart attack Paternal Grandmother    Arthritis Paternal Grandmother    Arthritis Paternal Grandfather    Social History   Tobacco Use   Smoking status: Former    Current packs/day: 0.00    Average packs/day: 1 pack/day for 16.7 years (16.7 ttl pk-yrs)    Types: Cigarettes    Start date: 11/03/1969    Quit date: 07/22/1986    Years since quitting: 37.1   Smokeless tobacco: Never  Vaping Use   Vaping status: Never Used  Substance Use Topics   Alcohol use: Not Currently   Drug use: No    Pertinent Clinical Results:  LABS:   No visits with results within 3 Day(s) from this visit.  Latest known visit with results is:  Hospital Outpatient Visit on 09/09/2023  Component Date Value Ref Range Status   Sodium 09/09/2023 137  135 - 145 mmol/L Final   Potassium 09/09/2023 3.7  3.5 - 5.1 mmol/L Final   Chloride 09/09/2023 100  98 - 111 mmol/L Final   CO2 09/09/2023 28  22 - 32 mmol/L Final   Glucose, Bld 09/09/2023  147 (H)  70 - 99 mg/dL Final   Glucose reference range applies only to samples taken after fasting for at least 8 hours.   BUN 09/09/2023 23  8 - 23 mg/dL Final   Creatinine, Ser 09/09/2023 1.09  0.61 - 1.24 mg/dL Final   Calcium 78/29/5621 8.6 (L)  8.9 - 10.3 mg/dL Final   GFR, Estimated 09/09/2023 >60  >60 mL/min Final   Comment: (NOTE) Calculated using the CKD-EPI Creatinine Equation (2021)    Anion gap 09/09/2023 9  5 - 15 Final   Performed at [facility], [address] [place], [place] [address]   WBC 09/09/2023 7.2  4.0 - 10.5 K/uL Final   RBC 09/09/2023 3.97 (L)  4.22 - 5.81 MIL/uL Final   Hemoglobin 09/09/2023 12.6 (L)  13.0 - 17.0 g/dL Final   HCT 78/46/9629 36.9 (L)  39.0 - 52.0 % Final   MCV 09/09/2023 92.9  80.0 - 100.0 fL Final   MCH 09/09/2023 31.7  26.0 - 34.0 pg Final   MCHC 09/09/2023 34.1  30.0 - 36.0 g/dL Final   RDW 52/84/1324 12.1  11.5 - 15.5 % Final   Platelets 09/09/2023 191  150 - 400 K/uL Final   nRBC 09/09/2023 0.0  0.0 - 0.2 % Final   Performed at [facility], [address] [place], [place] [address]    ECG: Date: 06/09/2023 Time ECG obtained: 05/10/2023 AM Rate: 64 bpm Rhythm: normal sinus Axis (leads I and aVF): Normal Intervals: PR 188 ms. QRS 93 ms. QTc 424 ms. ST segment and T wave changes: No evidence of acute ST segment elevation or depression.  Evidence of a possible age undetermined anteroseptal infarct present. Comparison: Similar to previous tracing obtained on 04/27/2023   IMAGING / PROCEDURES: DG FOOT COMPLETE LEFT/RIGHT FEET performed on 07/03/2023 Interval amputation of the second and third digits left foot as well as the great toe and second digit to the right foot with resection of the fifth metatarsal head as well.  Clean osteotomies.  No gas within the tissue.   LEFT HEART CATHETERIZATION AND CORONARY ANGIOGRAPHY performed on 06/09/2023 Normal left ventricular systolic function with an EF of 55-65%.    LVEDP normal.   Multivessel  CAD 75% mid LM 80% ostial to proximal LAD 60% mid LAD 100% proximal and mid LCx 100% OM1 95% proximal RCA 100% mid RCA 50% mid to distal LAD Bypass grafts LIMA-LAD patent SVG-OM1 patent SVG-RCA (PDA) chronically occluded with left-to-right collaterals Recommendations Continue medical therapy and aggressive risk factor modification   VAS [place] ABI WITH/WO TBI performed on 10/07/2022 Resting right ankle-brachial index indicates noncompressible right lower extremity arteries. Right great toe amputation.  Resting left ankle-brachial index indicates noncompressible left lower extremity arteries. The left toe-brachial index is abnormal.   MYOCARDIAL PERFUSION IMAGING STUDY (LEXISCAN) performed on 07/22/2019 Normal left ventricular systolic function with an EF of 55-65% No evidence of stress-induced myocardial ischemia or arrhythmia; no scintigraphic evidence of scar Normal liver study  TRANSTHORACIC ECHOCARDIOGRAM performed on 07/22/2019 Left ventricular ejection fraction, by visual estimation, is 55 to 60%. The left ventricle has normal function. Left ventricular septal wall thickness was mildly increased. Mildly increased left ventricular posterior wall thickness. There is mildly increased left ventricular hypertrophy.  Global right ventricle has normal systolic function.The right ventricular size is normal. No increase in right ventricular wall thickness.  Left atrial size was not well visualized.  Right atrial size was not well visualized.  The mitral valve was not well visualized. Trace mitral valve regurgitation.  The tricuspid valve is not well visualized. Tricuspid valve regurgitation is mild.  The aortic valve was not well visualized Aortic valve regurgitation is trivial by color flow Doppler.  The pulmonic valve was not well visualized. Pulmonic valve regurgitation was not assessed by color flow Doppler.  The aortic root was not well visualized.   The interatrial septum was not assessed.   Impression and Plan:  OUSMANE MENSING has been referred for pre-anesthesia review and clearance prior to him undergoing the planned anesthetic and procedural courses. Available labs, pertinent testing, and imaging results were personally reviewed by me in preparation for upcoming operative/procedural course. [facility] medical record has been updated following extensive record review and patient interview with PAT staff.   This patient has been appropriately cleared by cardiology with an overall MODERATE risk of experiencing significant perioperative cardiovascular complications. Based on clinical review performed today (09/14/23), barring any significant acute changes in the patient's overall condition, it is anticipated that he will be able to proceed with the planned surgical intervention. Any acute changes in clinical condition may necessitate his procedure being postponed and/or cancelled. Patient will meet with anesthesia team (MD and/or CRNA) on the day of his procedure for preoperative evaluation/assessment. Questions regarding anesthetic course will be fielded at that time.   Pre-surgical instructions were reviewed with the patient during his PAT appointment, and questions were fielded to satisfaction by PAT clinical staff. He has been instructed on which medications that he will need to hold prior to surgery, as well as the ones that have been deemed safe/appropriate to take on the day of his procedure. As part of the general education provided by PAT, patient made aware both verbally and in writing, that he would need to abstain from the use of any illegal substances during his perioperative course.  He was advised that failure to follow the provided instructions could necessitate case cancellation or result in serious perioperative complications up to and including death. Patient encouraged to contact PAT and/or his surgeon's office to discuss any  questions or concerns that may arise prior to surgery; verbalized understanding.   Quentin Mulling, MSN, APRN, FNP-C, CEN [facility]  Perioperative Services Nurse  Practitioner Phone: [phone] Fax: [phone] 09/14/23 8:46 AM  NOTE: This note has been prepared using Dragon dictation software. Despite my best ability to proofread, there is always the potential that unintentional transcriptional errors may still occur from this process.

## 2023-09-15 ENCOUNTER — Ambulatory Visit: Payer: Medicare Other

## 2023-09-15 ENCOUNTER — Encounter: Payer: Self-pay | Admitting: Podiatry

## 2023-09-15 ENCOUNTER — Encounter
Admission: RE | Disposition: A | Discharge status: Home / Self Care | Payer: Self-pay | Source: Ambulatory Visit | Attending: Podiatry

## 2023-09-15 ENCOUNTER — Other Ambulatory Visit: Payer: Self-pay

## 2023-09-15 ENCOUNTER — Ambulatory Visit
Admission: RE | Admit: 2023-09-15 | Discharge: 2023-09-15 | Disposition: A | Discharge status: Home / Self Care | Payer: Medicare Other | Source: Ambulatory Visit | Attending: Podiatry | Admitting: Podiatry

## 2023-09-15 ENCOUNTER — Ambulatory Visit: Payer: Medicare Other | Admitting: Urgent Care

## 2023-09-15 DIAGNOSIS — E1142 Type 2 diabetes mellitus with diabetic polyneuropathy: Secondary | ICD-10-CM | POA: Insufficient documentation

## 2023-09-15 DIAGNOSIS — F419 Anxiety disorder, unspecified: Secondary | ICD-10-CM | POA: Diagnosis not present

## 2023-09-15 DIAGNOSIS — I25119 Atherosclerotic heart disease of native coronary artery with unspecified angina pectoris: Secondary | ICD-10-CM | POA: Insufficient documentation

## 2023-09-15 DIAGNOSIS — Z89411 Acquired absence of right great toe: Secondary | ICD-10-CM | POA: Diagnosis not present

## 2023-09-15 DIAGNOSIS — L97522 Non-pressure chronic ulcer of other part of left foot with fat layer exposed: Secondary | ICD-10-CM | POA: Insufficient documentation

## 2023-09-15 DIAGNOSIS — M1990 Unspecified osteoarthritis, unspecified site: Secondary | ICD-10-CM | POA: Diagnosis not present

## 2023-09-15 DIAGNOSIS — I071 Rheumatic tricuspid insufficiency: Secondary | ICD-10-CM | POA: Diagnosis not present

## 2023-09-15 DIAGNOSIS — E1122 Type 2 diabetes mellitus with diabetic chronic kidney disease: Secondary | ICD-10-CM | POA: Diagnosis not present

## 2023-09-15 DIAGNOSIS — Z89422 Acquired absence of other left toe(s): Secondary | ICD-10-CM | POA: Diagnosis not present

## 2023-09-15 DIAGNOSIS — Z89421 Acquired absence of other right toe(s): Secondary | ICD-10-CM | POA: Diagnosis not present

## 2023-09-15 DIAGNOSIS — E11621 Type 2 diabetes mellitus with foot ulcer: Secondary | ICD-10-CM | POA: Insufficient documentation

## 2023-09-15 DIAGNOSIS — G4730 Sleep apnea, unspecified: Secondary | ICD-10-CM | POA: Insufficient documentation

## 2023-09-15 DIAGNOSIS — K219 Gastro-esophageal reflux disease without esophagitis: Secondary | ICD-10-CM | POA: Insufficient documentation

## 2023-09-15 DIAGNOSIS — Z833 Family history of diabetes mellitus: Secondary | ICD-10-CM | POA: Diagnosis not present

## 2023-09-15 DIAGNOSIS — E1065 Type 1 diabetes mellitus with hyperglycemia: Secondary | ICD-10-CM

## 2023-09-15 DIAGNOSIS — R0602 Shortness of breath: Secondary | ICD-10-CM | POA: Insufficient documentation

## 2023-09-15 DIAGNOSIS — N183 Chronic kidney disease, stage 3 unspecified: Secondary | ICD-10-CM | POA: Diagnosis not present

## 2023-09-15 DIAGNOSIS — E1151 Type 2 diabetes mellitus with diabetic peripheral angiopathy without gangrene: Secondary | ICD-10-CM | POA: Diagnosis not present

## 2023-09-15 DIAGNOSIS — I129 Hypertensive chronic kidney disease with stage 1 through stage 4 chronic kidney disease, or unspecified chronic kidney disease: Secondary | ICD-10-CM | POA: Insufficient documentation

## 2023-09-15 DIAGNOSIS — Z87891 Personal history of nicotine dependence: Secondary | ICD-10-CM | POA: Diagnosis not present

## 2023-09-15 DIAGNOSIS — M21622 Bunionette of left foot: Secondary | ICD-10-CM | POA: Diagnosis not present

## 2023-09-15 HISTORY — PX: METATARSAL HEAD EXCISION: SHX5027

## 2023-09-15 HISTORY — DX: Basal cell carcinoma of skin, unspecified: C44.91

## 2023-09-15 HISTORY — DX: Carpal tunnel syndrome, left upper limb: G56.02

## 2023-09-15 HISTORY — DX: Cataract extraction status, right eye: Z98.41

## 2023-09-15 HISTORY — DX: Disorder of arteries and arterioles, unspecified: I77.9

## 2023-09-15 HISTORY — DX: Obstructive sleep apnea (adult) (pediatric): G47.33

## 2023-09-15 HISTORY — DX: Other ill-defined heart diseases: I51.89

## 2023-09-15 HISTORY — DX: Hyperlipidemia, unspecified: E78.5

## 2023-09-15 HISTORY — DX: Long term (current) use of aspirin: Z79.82

## 2023-09-15 HISTORY — DX: Type 2 diabetes mellitus with diabetic polyneuropathy: E11.42

## 2023-09-15 HISTORY — DX: Chronic kidney disease, stage 3 unspecified: N18.30

## 2023-09-15 HISTORY — DX: Cataract extraction status, right eye: Z98.42

## 2023-09-15 HISTORY — DX: Essential (primary) hypertension: I10

## 2023-09-15 HISTORY — DX: Long term (current) use of antithrombotics/antiplatelets: Z79.02

## 2023-09-15 HISTORY — DX: Type 2 diabetes mellitus without complications: E11.9

## 2023-09-15 LAB — GLUCOSE, CAPILLARY
Glucose-Capillary: 180 mg/dL — ABNORMAL HIGH (ref 70–99)
Glucose-Capillary: 197 mg/dL — ABNORMAL HIGH (ref 70–99)

## 2023-09-15 SURGERY — EXCISION, METATARSAL BONE, HEAD
Anesthesia: Monitor Anesthesia Care | Site: Toe | Laterality: Left

## 2023-09-15 MED ORDER — SODIUM CHLORIDE 0.9 % IV SOLN: INTRAVENOUS | Status: DC | PRN

## 2023-09-15 MED ORDER — LIDOCAINE HCL (PF) 1 % IJ SOLN
INTRAMUSCULAR | Status: AC
  Filled 2023-09-15: qty 30

## 2023-09-15 MED ORDER — PROPOFOL 500 MG/50ML IV EMUL
INTRAVENOUS | Status: DC | PRN
  Administered 2023-09-15: 100 ug/kg/min via INTRAVENOUS

## 2023-09-15 MED ORDER — LIDOCAINE HCL (PF) 1 % IJ SOLN
INTRAMUSCULAR | Status: DC | PRN
  Administered 2023-09-15: 10 mL

## 2023-09-15 MED ORDER — CEFAZOLIN SODIUM-DEXTROSE 2-4 GM/100ML-% IV SOLN
2.0000 g | INTRAVENOUS | Status: AC
  Administered 2023-09-15: 2 g via INTRAVENOUS

## 2023-09-15 MED ORDER — 0.9 % SODIUM CHLORIDE (POUR BTL) OPTIME
TOPICAL | Status: DC | PRN
  Administered 2023-09-15: 500 mL

## 2023-09-15 MED ORDER — ONDANSETRON HCL 4 MG/2ML IJ SOLN
INTRAMUSCULAR | Status: DC | PRN
  Administered 2023-09-15: 4 mg via INTRAVENOUS

## 2023-09-15 MED ORDER — PROPOFOL 10 MG/ML IV BOLUS
INTRAVENOUS | Status: AC
  Filled 2023-09-15: qty 20

## 2023-09-15 MED ORDER — LIDOCAINE HCL (PF) 2 % IJ SOLN
INTRAMUSCULAR | Status: AC
  Filled 2023-09-15: qty 5

## 2023-09-15 MED ORDER — DEXAMETHASONE SODIUM PHOSPHATE 10 MG/ML IJ SOLN
INTRAMUSCULAR | Status: AC
  Filled 2023-09-15: qty 1

## 2023-09-15 MED ORDER — CEFAZOLIN SODIUM-DEXTROSE 2-4 GM/100ML-% IV SOLN
INTRAVENOUS | Status: AC
  Filled 2023-09-15: qty 100

## 2023-09-15 MED ORDER — FENTANYL CITRATE (PF) 100 MCG/2ML IJ SOLN
INTRAMUSCULAR | Status: AC
  Filled 2023-09-15: qty 2

## 2023-09-15 MED ORDER — FENTANYL CITRATE (PF) 100 MCG/2ML IJ SOLN
INTRAMUSCULAR | Status: DC | PRN
  Administered 2023-09-15: 50 ug via INTRAVENOUS

## 2023-09-15 MED ORDER — DOXYCYCLINE HYCLATE 100 MG PO TABS: 100.0000 mg | ORAL_TABLET | Freq: Two times a day (BID) | ORAL | 0 refills | Status: DC

## 2023-09-15 MED ORDER — MIDAZOLAM HCL 2 MG/2ML IJ SOLN
INTRAMUSCULAR | Status: DC | PRN
  Administered 2023-09-15 (×2): 1 mg via INTRAVENOUS

## 2023-09-15 MED ORDER — ONDANSETRON HCL 4 MG/2ML IJ SOLN
INTRAMUSCULAR | Status: AC
  Filled 2023-09-15: qty 2

## 2023-09-15 MED ORDER — BUPIVACAINE HCL (PF) 0.5 % IJ SOLN
INTRAMUSCULAR | Status: AC
  Filled 2023-09-15: qty 30

## 2023-09-15 MED ORDER — PROPOFOL 1000 MG/100ML IV EMUL
INTRAVENOUS | Status: AC
  Filled 2023-09-15: qty 100

## 2023-09-15 MED ORDER — ORAL CARE MOUTH RINSE: 15.0000 mL | Freq: Once | OROMUCOSAL | Status: AC

## 2023-09-15 MED ORDER — MIDAZOLAM HCL 2 MG/2ML IJ SOLN
INTRAMUSCULAR | Status: AC
  Filled 2023-09-15: qty 2

## 2023-09-15 MED ORDER — ACETAMINOPHEN 10 MG/ML IV SOLN
INTRAVENOUS | Status: AC
  Filled 2023-09-15: qty 100

## 2023-09-15 MED ORDER — ONDANSETRON HCL 4 MG/2ML IJ SOLN: 4.0000 mg | Freq: Once | INTRAMUSCULAR | Status: DC | PRN

## 2023-09-15 MED ORDER — VANCOMYCIN HCL 1000 MG IV SOLR
INTRAVENOUS | Status: AC
  Filled 2023-09-15: qty 20

## 2023-09-15 MED ORDER — CHLORHEXIDINE GLUCONATE 0.12 % MT SOLN
15.0000 mL | Freq: Once | OROMUCOSAL | Status: AC
  Administered 2023-09-15: 15 mL via OROMUCOSAL

## 2023-09-15 MED ORDER — BUPIVACAINE HCL 0.5 % IJ SOLN
INTRAMUSCULAR | Status: DC | PRN
  Administered 2023-09-15: 10 mL

## 2023-09-15 MED ORDER — ACETAMINOPHEN 10 MG/ML IV SOLN
INTRAVENOUS | Status: DC | PRN
  Administered 2023-09-15: 1000 mg via INTRAVENOUS

## 2023-09-15 MED ORDER — CHLORHEXIDINE GLUCONATE 0.12 % MT SOLN
OROMUCOSAL | Status: AC
  Filled 2023-09-15: qty 15

## 2023-09-15 SURGICAL SUPPLY — 46 items
BLADE MED AGGRESSIVE (BLADE) ×1 IMPLANT
BLADE OSC/SAGITTAL MD 5.5X18 (BLADE) IMPLANT
BLADE OSCILLATING/SAGITTAL (BLADE) ×1
BLADE SURG 15 STRL LF DISP TIS (BLADE) IMPLANT
BLADE SURG 15 STRL SS (BLADE)
BLADE SURG MINI STRL (BLADE) IMPLANT
BLADE SW THK.38XMED LNG THN (BLADE) IMPLANT
BNDG CMPR 5X4 KNIT ELC UNQ LF (GAUZE/BANDAGES/DRESSINGS) ×1
BNDG CMPR 75X21 PLY HI ABS (MISCELLANEOUS)
BNDG ELASTIC 4INX 5YD STR LF (GAUZE/BANDAGES/DRESSINGS) ×1 IMPLANT
BNDG ESMARCH 4 X 12 STRL LF (GAUZE/BANDAGES/DRESSINGS) ×1
BNDG ESMARCH 4X12 STRL LF (GAUZE/BANDAGES/DRESSINGS) ×1 IMPLANT
BNDG GAUZE DERMACEA FLUFF 4 (GAUZE/BANDAGES/DRESSINGS) ×1 IMPLANT
BNDG GZE DERMACEA 4 6PLY (GAUZE/BANDAGES/DRESSINGS) ×1
CUFF TOURN SGL QUICK 12 (TOURNIQUET CUFF) IMPLANT
CUFF TOURN SGL QUICK 18X4 (TOURNIQUET CUFF) IMPLANT
DRAPE FLUOR MINI C-ARM 54X84 (DRAPES) IMPLANT
DURAPREP 26ML APPLICATOR (WOUND CARE) ×1 IMPLANT
ELECT REM PT RETURN 9FT ADLT (ELECTROSURGICAL) ×1
ELECTRODE REM PT RTRN 9FT ADLT (ELECTROSURGICAL) ×1 IMPLANT
GAUZE SPONGE 4X4 12PLY STRL (GAUZE/BANDAGES/DRESSINGS) ×2 IMPLANT
GAUZE STRETCH 2X75IN STRL (MISCELLANEOUS) IMPLANT
GAUZE XEROFORM 1X8 LF (GAUZE/BANDAGES/DRESSINGS) ×1 IMPLANT
GLOVE BIO SURGEON STRL SZ8 (GLOVE) ×1 IMPLANT
GLOVE BIOGEL PI IND STRL 8 (GLOVE) ×1 IMPLANT
GOWN STRL REUS W/ TWL LRG LVL3 (GOWN DISPOSABLE) ×2 IMPLANT
GOWN STRL REUS W/TWL LRG LVL3 (GOWN DISPOSABLE) ×2
KIT TURNOVER KIT A (KITS) ×1 IMPLANT
LABEL OR SOLS (LABEL) ×1 IMPLANT
MANIFOLD NEPTUNE II (INSTRUMENTS) ×1 IMPLANT
NDL FILTER BLUNT 18X1 1/2 (NEEDLE) ×1 IMPLANT
NDL HYPO 25X1 1.5 SAFETY (NEEDLE) ×2 IMPLANT
NEEDLE FILTER BLUNT 18X1 1/2 (NEEDLE) ×1 IMPLANT
NEEDLE HYPO 25X1 1.5 SAFETY (NEEDLE) ×1 IMPLANT
NS IRRIG 500ML POUR BTL (IV SOLUTION) ×1 IMPLANT
PACK EXTREMITY ARMC (MISCELLANEOUS) ×1 IMPLANT
SOL PREP PVP 2OZ (MISCELLANEOUS) ×1
SOLUTION PREP PVP 2OZ (MISCELLANEOUS) ×1 IMPLANT
STOCKINETTE STRL 6IN 960660 (GAUZE/BANDAGES/DRESSINGS) ×1 IMPLANT
SUT ETHILON 3-0 FS-10 30 BLK (SUTURE)
SUT VIC AB 3-0 SH 27 (SUTURE) ×1
SUT VIC AB 3-0 SH 27X BRD (SUTURE) ×1 IMPLANT
SUTURE EHLN 3-0 FS-10 30 BLK (SUTURE) ×2 IMPLANT
SYR 10ML LL (SYRINGE) ×1 IMPLANT
TRAP FLUID SMOKE EVACUATOR (MISCELLANEOUS) ×1 IMPLANT
WATER STERILE IRR 500ML POUR (IV SOLUTION) ×1 IMPLANT

## 2023-09-15 NOTE — Brief Op Note (Signed)
09/15/2023  1:28 PM  PATIENT:  Lawrence [name]  72 y.o. male  PRE-OPERATIVE DIAGNOSIS:  ULCER  POST-OPERATIVE DIAGNOSIS:  Left Foot Ulcer  PROCEDURE:  Procedure(s): METATARSAL HEAD EXCISION (Left)  SURGEON:  Surgeons and Role:    Felecia Shelling, DPM - Primary  PHYSICIAN ASSISTANT: None  ASSISTANTS: none   ANESTHESIA:   local and IV sedation  EBL:  5 mL   BLOOD ADMINISTERED:none  DRAINS: none   LOCAL MEDICATIONS USED:  MARCAINE   , LIDOCAINE , and Amount: 20 ml  SPECIMEN:  No Specimen  DISPOSITION OF SPECIMEN:  N/A  COUNTS:  YES  TOURNIQUET:   Total Tourniquet Time Documented: Calf (Left) - 15 minutes Total: Calf (Left) - 15 minutes   DICTATION: .Reubin Milan Dictation  PLAN OF CARE: Discharge home  PATIENT DISPOSITION:  PACU - hemodynamically stable.   Delay start of Pharmacological VTE agent (>24hrs) due to surgical blood loss or risk of bleeding: no  Felecia Shelling, DPM [facility]  Dr. Felecia Shelling, DPM    [address] [place], [place] [address]                Office [phone]  Fax [phone]

## 2023-09-15 NOTE — Interval H&P Note (Signed)
History and Physical Interval Note:  09/15/2023 12:11 PM  Lawrence [name]  has presented today for surgery, with the diagnosis of ULCER.  The various methods of treatment have been discussed with the patient and family. After consideration of risks, benefits and other options for treatment, the patient has consented to  Procedure(s): METATARSAL HEAD EXCISION (Left) as a surgical intervention.  The patient's history has been reviewed, patient examined, no change in status, stable for surgery.  I have reviewed the patient's chart and labs.  Questions were answered to the patient's satisfaction.     Felecia Shelling

## 2023-09-15 NOTE — Anesthesia Preprocedure Evaluation (Signed)
Anesthesia Evaluation  Patient identified by MRN, date of birth, ID band Patient awake    Reviewed: Allergy & Precautions, H&P , NPO status , Patient's Chart, lab work & pertinent test results, reviewed documented beta blocker date and time   History of Anesthesia Complications Negative for: history of anesthetic complications  Airway Mallampati: III  TM Distance: >3 FB Neck ROM: full    Dental  (+) Poor Dentition, Missing, Chipped   Pulmonary shortness of breath, sleep apnea , neg COPD, Patient abstained from smoking.Not current smoker, former smoker   Pulmonary exam normal breath sounds clear to auscultation       Cardiovascular Exercise Tolerance: Poor METShypertension, Pt. on medications + angina  + CAD, + Past MI and + Peripheral Vascular Disease  (-) dysrhythmias  Rhythm:regular Rate:Normal - Systolic murmurs TTE 2020: 1. Left ventricular ejection fraction, by visual estimation, is 55 to  60%. The left ventricle has normal function. Left ventricular septal wall  thickness was mildly increased. Mildly increased left ventricular  posterior wall thickness. There is mildly  increased left ventricular hypertrophy.   2. Global right ventricle has normal systolic function.The right  ventricular size is normal. No increase in right ventricular wall  thickness.   3. Left atrial size was not well visualized.   4. Right atrial size was not well visualized.   5. The mitral valve was not well visualized. Trace mitral valve  regurgitation.   6. The tricuspid valve is not well visualized. Tricuspid valve  regurgitation is mild.   7. The aortic valve was not well visualized Aortic valve regurgitation is  trivial by color flow Doppler.   8. The pulmonic valve was not well visualized. Pulmonic valve  regurgitation was not assessed by color flow Doppler.   9. The aortic root was not well visualized.  10. The interatrial septum was not  assessed.     Neuro/Psych  Headaches PSYCHIATRIC DISORDERS Anxiety        GI/Hepatic Neg liver ROS,GERD  Medicated and Controlled,,  Endo/Other  diabetes    Renal/GU Renal disease     Musculoskeletal   Abdominal   Peds  Hematology negative hematology ROS (+)   Anesthesia Other Findings Past Medical History: No date: Allergies No date: Anginal pain (HCC) No date: Bilateral carotid artery disease (HCC) 07/2007: CAD (coronary artery disease)     Comment:  a.) s/p NSTEMI 07/2007 - sig multi-vessel CAD not               amenable to PCI; b.) LHC 06/11/2016: multi vessel CAD not              amenable to PCI --> refer to CVTS; c.) s/p 4v CABG               06/27/2016; d.) LHC 06/09/2023: 75% mLM, 80% o-pLAD, 60%               mLAD, 100% p-mLCx, 100% OM1, 95% pRCA, 100% mRCA, 50%               m-dLAD. LIMA-LAD and SVG-OM1 patent. CTO SVG-RCA with L-R              collaterals - med mgmt No date: Carpal tunnel syndrome on left     Comment:  a.) s/p release 04/2016 No date: CKD (chronic kidney disease), stage III (HCC) No date: Diabetic peripheral neuropathy (HCC) No date: Diastolic dysfunction     Comment:  a.) TTE 06/25/2016: EF 60-654%. no RWMAs,  G1DD, mild AoV              annular calc; b.) TTE 03/19/2017: EF 50-55%, no RWMAs,               mild MR; c.) TTE 07/22/2019: EF 55-60%, midl LVH, no               RWMAs, triv MR, mild TR No date: Dyspnea No date: GERD (gastroesophageal reflux disease) No date: Glaucoma No date: Headache No date: History of bilateral cataract extraction No date: HLD (hyperlipidemia) No date: HTN (hypertension) No date: Long term current use of clopidogrel No date: Long-term use of aspirin therapy No date: MRSA (methicillin resistant staph aureus) culture positive     Comment:  2006 - 2008 07/27/2007: NSTEMI (non-ST elevated myocardial infarction) (HCC)     Comment:  a.) LHC 07/30/2007: 30% mLAD, 99% D1, 50% pLCx, 40%               mLCx,  70/75/85% dLCx, 50% OM1, 75/80/80 OM2, 50% pRCA,               60% mRCA, 85% dRCA --> not amenable to PCI No date: OSA on CPAP No date: Osteoarthritis No date: PAD (peripheral artery disease) (HCC)     Comment:  a. 03/2022 Lower Ext Angio: No signif Ao-iliac dzs. R PT               diff dzs and occluded above ankle. R Pedal arch intact w/              excellent antegrade flow provided by R AT-->no indication              for revasc. 06/27/2016: S/P CABG x 4     Comment:  a.) LIMA-LAD, SVG-RCA, seq SVG-OM1-OM2 No date: Skin cancer, basal cell No date: Skin ulcer of right great toe, unspecified ulcer stage (HCC) No date: T2DM (type 2 diabetes mellitus) (HCC) No date: Wears glasses No date: Wears partial dentures     Comment:  top  Reproductive/Obstetrics negative OB ROS                             Anesthesia Physical Anesthesia Plan  ASA: 3  Anesthesia Plan: General   Post-op Pain Management: Minimal or no pain anticipated   Induction: Intravenous  PONV Risk Score and Plan: 2 and Propofol infusion, TIVA and Ondansetron  Airway Management Planned: Nasal Cannula  Additional Equipment: None  Intra-op Plan:   Post-operative Plan:   Informed Consent: I have reviewed the patients History and Physical, chart, labs and discussed the procedure including the risks, benefits and alternatives for the proposed anesthesia with the patient or authorized representative who has indicated his/her understanding and acceptance.     Dental advisory given  Plan Discussed with: CRNA and Surgeon  Anesthesia Plan Comments: (Discussed risks of anesthesia with patient, including possibility of difficulty with spontaneous ventilation under anesthesia necessitating airway intervention, PONV, and rare risks such as cardiac or respiratory or neurological events, and allergic reactions. Discussed the role of CRNA in patient's perioperative care. Patient understands.)        Anesthesia Quick Evaluation

## 2023-09-15 NOTE — Transfer of Care (Signed)
Immediate Anesthesia Transfer of Care Note  Patient: Lawrence [name]  Procedure(s) Performed: METATARSAL HEAD EXCISION (Left: Toe)  Patient Location: PACU  Anesthesia Type:MAC  Level of Consciousness: drowsy and patient cooperative  Airway & Oxygen Therapy: Patient Spontanous Breathing and Patient connected to face mask oxygen  Post-op Assessment: Report given to RN and Patient moving all extremities X 4  Post vital signs: Reviewed and stable  Last Vitals:  Vitals Value Taken Time  BP 94/47 09/15/23 1307  Temp 36.7 C 09/15/23 1309  Pulse 56 09/15/23 1308  Resp 13 09/15/23 1308  SpO2 99 % 09/15/23 1309  Vitals shown include unfiled device data.  Last Pain:  Vitals:   09/15/23 1309  TempSrc:   PainSc: 0-No pain         Complications: No notable events documented.

## 2023-09-15 NOTE — Anesthesia Postprocedure Evaluation (Signed)
Anesthesia Post Note  Patient: Lawrence [name]  Procedure(s) Performed: METATARSAL HEAD EXCISION (Left: Toe)  Patient location during evaluation: PACU Anesthesia Type: MAC Level of consciousness: awake and alert Pain management: pain level controlled Vital Signs Assessment: post-procedure vital signs reviewed and stable Respiratory status: spontaneous breathing, nonlabored ventilation, respiratory function stable and patient connected to nasal cannula oxygen Cardiovascular status: stable and blood pressure returned to baseline Postop Assessment: no apparent nausea or vomiting Anesthetic complications: no   No notable events documented.   Last Vitals:  Vitals:   09/15/23 1345 09/15/23 1421  BP: (!) 137/58 (!) 146/51  Pulse: 63   Resp: (!) 21 18  Temp:    SpO2: 100% 100%    Last Pain:  Vitals:   09/15/23 1421  TempSrc:   PainSc: 0-No pain                 Corinda Gubler

## 2023-09-15 NOTE — Anesthesia Procedure Notes (Signed)
Procedure Name: MAC Date/Time: 09/15/2023 12:38 PM  Performed by: Lily Lovings, CRNAPre-anesthesia Checklist: Patient identified, Emergency Drugs available, Suction available and Patient being monitored Patient Re-evaluated:Patient Re-evaluated prior to induction Oxygen Delivery Method: Simple face mask Preoxygenation: Pre-oxygenation with 100% oxygen Induction Type: IV induction

## 2023-09-15 NOTE — Discharge Instructions (Signed)
Leave dressings clean dry and intact.  WBAT surgical shoe.  Patient has a surgical shoe at home.

## 2023-09-15 NOTE — Op Note (Signed)
   OPERATIVE REPORT Patient name: Lawrence [name] MRN: [id number] DOB: December 20, 1950  DOS: 09/15/23  Preop Dx: Ulcer fifth MTP left foot Postop Dx: same  Procedure:  1.  Fifth metatarsal head resection left  Surgeon: Felecia Shelling DPM  Anesthesia: IV sedation with 50-50 mixture of 2% lidocaine plain with 0.5% Marcaine plain totaling 20 mL infiltrated in the patient's left foot  Hemostasis: Calf tourniquet inflated to a pressure of after esmarch exsanguination   EBL: Minimal mL Materials: None Injectables: None Pathology: None  Condition: The patient tolerated the procedure and anesthesia well. No complications noted or reported   Justification for procedure: The patient is a 72 y.o. male.  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 All conservative modalities of been unsuccessful in providing any sort of satisfactory alleviation of symptoms with the patient. The patient was told benefits as well as possible side effects of the surgery. The patient consented for surgical correction. The patient consent form was reviewed. All patient questions were answered. No guarantees were expressed or implied. The patient and the surgeon both signed the patient consent form with the witness present and placed in the patient's chart.   Procedure in Detail: The patient was brought to the operating room, placed in the operating table in the supine position at which time an aseptic scrub and drape were performed about the patient's respective lower extremity after anesthesia was induced as described above. Attention was then directed to the surgical area where procedure number one commenced.  Procedure #1: Fifth metatarsal head resection left foot A 3 cm linear longitudinal skin incision was planned and made overlying the distal portion of  the fifth metatarsal of the left foot.  Incision was carried down to the level of bone.  The soft tissue around the distal portion of the fifth metatarsal was reflected away using a #15 scalpel.  The EDL tendon was identified and tenotomy was performed to alleviate dorsal tension/pull.  An osteotomy was then created just proximal to the neck of the fifth metatarsal and a slight dorsal distal to proximal plantar orientation with slight obliquity.  The distal portion of the metatarsal was removed in toto.  Irrigation was utilized and routine layered primary closure was achieved using 3-0 Vicryl and stainless steel skin staples.  Dry sterile compressive dressings were then applied to all previously mentioned incision sites about the patient's lower extremity. The tourniquet which was used for hemostasis was deflated. All normal neurovascular responses including pink color and warmth returned all the digits of patient's lower extremity.  The patient was then transferred from the operating room to the recovery room having tolerated the procedure and anesthesia well. All vital signs are stable. After a brief stay in the recovery room the patient was discharged with postoperative orders placed.     Felecia Shelling, DPM [facility]  Dr. Felecia Shelling, DPM    [address] [place], [place] [address]                Office [phone]  Fax [phone]

## 2023-09-16 ENCOUNTER — Encounter: Payer: Self-pay | Admitting: Podiatry

## 2023-09-17 LAB — SURGICAL PATHOLOGY

## 2023-09-22 ENCOUNTER — Encounter: Payer: Self-pay | Admitting: Podiatry

## 2023-09-22 ENCOUNTER — Ambulatory Visit (INDEPENDENT_AMBULATORY_CARE_PROVIDER_SITE_OTHER): Payer: Medicare Other

## 2023-09-22 ENCOUNTER — Ambulatory Visit (INDEPENDENT_AMBULATORY_CARE_PROVIDER_SITE_OTHER): Payer: Medicare Other | Admitting: Podiatry

## 2023-09-22 DIAGNOSIS — M21622 Bunionette of left foot: Secondary | ICD-10-CM

## 2023-09-22 DIAGNOSIS — L97512 Non-pressure chronic ulcer of other part of right foot with fat layer exposed: Secondary | ICD-10-CM | POA: Diagnosis not present

## 2023-09-22 DIAGNOSIS — E0843 Diabetes mellitus due to underlying condition with diabetic autonomic (poly)neuropathy: Secondary | ICD-10-CM

## 2023-09-22 DIAGNOSIS — Z9889 Other specified postprocedural states: Secondary | ICD-10-CM

## 2023-09-22 MED ORDER — DOXYCYCLINE HYCLATE 100 MG PO TABS: 100.0000 mg | ORAL_TABLET | Freq: Two times a day (BID) | ORAL | 0 refills | Status: DC

## 2023-09-22 NOTE — Progress Notes (Signed)
Chief Complaint  Patient presents with   Routine Post Op    POV #1 DOS 09/15/2023 5TH MET HEAD RESECTION LT "It's doing pretty good.  It's a little sore when I stand up on it."    Subjective:  Patient presents today status post fifth metatarsal head resection of the left foot.  DOS: 09/15/2023.  Patient doing well.  Dressings are clean dry and intact.  WBAT surgical shoes bilateral.  He is on his last day of oral doxycycline.  Past Medical History:  Diagnosis Date   Allergies    Anginal pain (HCC)    Bilateral carotid artery disease (HCC)    CAD (coronary artery disease) 07/2007   a.) s/p NSTEMI 07/2007 - sig multi-vessel CAD not amenable to PCI; b.) LHC 06/11/2016: multi vessel CAD not amenable to PCI --> refer to CVTS; c.) s/p 4v CABG 06/27/2016; d.) LHC 06/09/2023: 75% mLM, 80% o-pLAD, 60% mLAD, 100% p-mLCx, 100% OM1, 95% pRCA, 100% mRCA, 50% m-dLAD. LIMA-LAD and SVG-OM1 patent. CTO SVG-RCA with L-R collaterals - med mgmt   Carpal tunnel syndrome on left    a.) s/p release 04/2016   CKD (chronic kidney disease), stage III (HCC)    Diabetic peripheral neuropathy (HCC)    Diastolic dysfunction    a.) TTE 06/25/2016: EF 60-654%. no RWMAs, G1DD, mild AoV annular calc; b.) TTE 03/19/2017: EF 50-55%, no RWMAs, mild MR; c.) TTE 07/22/2019: EF 55-60%, midl LVH, no RWMAs, triv MR, mild TR   Dyspnea    GERD (gastroesophageal reflux disease)    Glaucoma    Headache    History of bilateral cataract extraction    HLD (hyperlipidemia)    HTN (hypertension)    Long term current use of clopidogrel    Long-term use of aspirin therapy    MRSA (methicillin resistant staph aureus) culture positive    2006 - 2008   NSTEMI (non-ST elevated myocardial infarction) (HCC) 07/27/2007   a.) LHC 07/30/2007: 30% mLAD, 99% D1, 50% pLCx, 40% mLCx, 70/75/85% dLCx, 50% OM1, 75/80/80 OM2, 50% pRCA, 60% mRCA, 85% dRCA --> not amenable to PCI   OSA on CPAP    Osteoarthritis    PAD (peripheral artery disease)  (HCC)    a. 03/2022 Lower Ext Angio: No signif Ao-iliac dzs. R PT diff dzs and occluded above ankle. R Pedal arch intact w/ excellent antegrade flow provided by R AT-->no indication for revasc.   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   a.) LIMA-LAD, SVG-RCA, seq SVG-OM1-OM2   Skin cancer, basal cell    Skin ulcer of right great toe, unspecified ulcer stage (HCC)    T2DM (type 2 diabetes mellitus) (HCC)    Wears glasses    Wears partial dentures    top    Past Surgical History:  Procedure Laterality Date   ABDOMINAL AORTOGRAM W/LOWER EXTREMITY N/A 03/26/2022   Procedure: ABDOMINAL AORTOGRAM W/LOWER EXTREMITY;  Surgeon: Iran Ouch, MD;  Location: [facility];  Service: Cardiovascular;  Laterality: N/A;   AMPUTATION TOE Bilateral 06/26/2023   Procedure: AMPUTATION TOE MPJ OF THE JOINT,PARTIAL TOE AMPUTUATION;  Surgeon: Felecia Shelling, DPM;  Location: [facility];  Service: Orthopedics/Podiatry;  Laterality: Bilateral;   APPENDECTOMY     CARDIAC CATHETERIZATION N/A 06/11/2016   Procedure: Left Heart Cath and Coronary Angiography;  Surgeon: Lamar Blinks, MD;  Location: [facility];  Service: Cardiovascular;  Laterality: N/A;   CARPAL TUNNEL RELEASE Left 04/24/2016   Procedure: CARPAL TUNNEL RELEASE;  Surgeon: Kennedy Bucker, MD;  Location: [facility];  Service: Orthopedics;  Laterality: Left;   CATARACT EXTRACTION W/ INTRAOCULAR LENS  IMPLANT, BILATERAL Bilateral    CATARACT EXTRACTION, BILATERAL     R eye 07/16/12, L eye 08/13/12 - with lens implant in both eyes   CORONARY ARTERY BYPASS GRAFT N/A 06/27/2016   Procedure: CORONARY ARTERY BYPASS GRAFTING times four using left internal mammary artery and right leg saphenous vein;  Surgeon: Kerin Perna, MD;  Location[facility];  Service: Open Heart Surgery;  Laterality: N/A;   FOREARM FRACTURE SURGERY Left 1961   KNEE ARTHROPLASTY Left 02/11/2016   Procedure: COMPUTER ASSISTED TOTAL KNEE ARTHROPLASTY;  Surgeon: Donato Heinz, MD;   Location: [facility] OR[facility];  Service: Orthopedics;  Laterality: Left;   KNEE ARTHROSCOPY Right 2000   LEFT HEART CATH AND CORONARY ANGIOGRAPHY Left 07/30/2007   Procedure: LEFT HEART CATH AND CORONARY ANGIOGRAPHY; Location: [facility]; Surgeon: Arnoldo Hooker, MD   LEFT HEART CATH AND CORS/GRAFTS ANGIOGRAPHY Left 01/13/2017   Procedure: Left Heart Cath and Cors/Grafts Angiography;  Surgeon: Lamar Blinks, MD;  Location: [facility];  Service: Cardiovascular;  Laterality: Left;   LEFT HEART CATH AND CORS/GRAFTS ANGIOGRAPHY Left 06/09/2023   Procedure: LEFT HEART CATH AND CORS/GRAFTS ANGIOGRAPHY;  Surgeon: Marcina Millard, MD;  Location: [facility];  Service: Cardiovascular;  Laterality: Left;   METATARSAL HEAD EXCISION Right 03/21/2020   Procedure: METATARSAL HEAD RESECTION RIGHT AND DEBRIDEMENT OF ULCER;  Surgeon: Felecia Shelling, DPM;  Location: [facility] OR;  Service: Podiatry;  Laterality: Right;   METATARSAL HEAD EXCISION Left 09/15/2023   Procedure: METATARSAL HEAD EXCISION;  Surgeon: Felecia Shelling, DPM;  Location: [facility];  Service: Orthopedics/Podiatry;  Laterality: Left;   MULTIPLE TOOTH EXTRACTIONS     TEE WITHOUT CARDIOVERSION N/A 06/27/2016   Procedure: TRANSESOPHAGEAL ECHOCARDIOGRAM (TEE);  Surgeon: Kerin Perna, MD;  Location: [facility] OR;  Service: Open Heart Surgery;  Laterality: N/A;    Allergies  Allergen Reactions   Latex Other (See Comments)    Blister and burning (54M insulin pump latex band aid )   Nitroglycerin Nausea Only and Other (See Comments)    Patches only - headache    Statins Other (See Comments)    BODY PAIN, Pt taking Crestor*      Fluvastatin Other (See Comments)    Other reaction(s): Muscle pain   Fluvastatin Sodium     Other reaction(s): Myalgias   Rosuvastatin Other (See Comments)    Other reaction(s): Muscle pain, Myalgias   Simvastatin Other (See Comments)    Other reaction(s): Muscle pain, Myalgias   Ezetimibe Other (See Comments)     Other reaction(s): Muscle Pain   Lipitor [Atorvastatin] Other (See Comments) and Rash    BODY PAIN   Lisinopril Other (See Comments) and Nausea Only    Light headed, dizzy Other reaction(s): Dizziness   Objective/Physical Exam Staples are intact.  Incision well coapted.  Ulcers noted to the distal tip of the left great toe as well as the left fourth toe and right third toe with granular wound base.  No purulence.  No exposed bone.  Each wound measures approximately 1.0 x 1.0 x 0.2 cm.  Radiographic Exam LT foot 09/22/2023:  Interval amputation of the second and third digits left foot as well as the great toe and second digit to the right foot with resection of the fifth metatarsal head as well.  Clean osteotomies.  No gas within the tissue.  Assessment:  1. s/p amputation left third and right second. DOS: 06/26/2023; healed 2. PSxHx amputation RT great toe and 5th met head resection. LT second. 3.  Diabetes mellitus with peripheral polyneuropathy 4.  Ulcer left great toe and left fourth toe as well as the right third toe.  Ulcer plantar aspect of the fifth MTP left  Plan of Care:  -Patient was evaluated.  X-rays reviewed - Diabetic shoes pending.  Order placed for diabetic shoes with custom molded Plastizote insoles on 08/07/2023. -Medically necessary excisional debridement including subcutaneous tissue was performed today using a tissue nipper.  Excisional debridement of the necrotic nonviable tissue down to healthier bleeding viable tissue was performed with postdebridement measurement same as pre- -Prisma and Hydrofera Blue with dry sterile dressings were applied.  Leave clean dry and intact x 1 week -WBAT surgical shoes bilateral -refill doxycycline 100 mg 2 times daily #20 -Return to clinic 1 week  Felecia Shelling, DPM [facility]  Dr. Felecia Shelling, DPM    [address] [place], [place] [address]                Office [phone]  Fax [phone]

## 2023-09-29 ENCOUNTER — Ambulatory Visit (INDEPENDENT_AMBULATORY_CARE_PROVIDER_SITE_OTHER): Payer: Medicare Other | Admitting: Podiatry

## 2023-09-29 ENCOUNTER — Encounter: Payer: Self-pay | Admitting: Podiatry

## 2023-09-29 VITALS — Ht 70.5 in | Wt 290.0 lb

## 2023-09-29 DIAGNOSIS — E0843 Diabetes mellitus due to underlying condition with diabetic autonomic (poly)neuropathy: Secondary | ICD-10-CM

## 2023-09-29 DIAGNOSIS — L97512 Non-pressure chronic ulcer of other part of right foot with fat layer exposed: Secondary | ICD-10-CM | POA: Diagnosis not present

## 2023-09-29 DIAGNOSIS — Z9889 Other specified postprocedural states: Secondary | ICD-10-CM

## 2023-09-29 NOTE — Progress Notes (Signed)
Chief Complaint  Patient presents with   Routine Post Op    RM9: POV #2 DOS 09/15/2023 5TH MET HEAD RESECTION LT    Subjective:  Patient presents today status post fifth metatarsal head resection of the left foot.  DOS: 09/15/2023.  Patient doing well.  Dressings are clean dry and intact.  WBAT surgical shoes bilateral.   Past Medical History:  Diagnosis Date   Allergies    Anginal pain (HCC)    Bilateral carotid artery disease (HCC)    CAD (coronary artery disease) 07/2007   a.) s/p NSTEMI 07/2007 - sig multi-vessel CAD not amenable to PCI; b.) LHC 06/11/2016: multi vessel CAD not amenable to PCI --> refer to CVTS; c.) s/p 4v CABG 06/27/2016; d.) LHC 06/09/2023: 75% mLM, 80% o-pLAD, 60% mLAD, 100% p-mLCx, 100% OM1, 95% pRCA, 100% mRCA, 50% m-dLAD. LIMA-LAD and SVG-OM1 patent. CTO SVG-RCA with L-R collaterals - med mgmt   Carpal tunnel syndrome on left    a.) s/p release 04/2016   CKD (chronic kidney disease), stage III (HCC)    Diabetic peripheral neuropathy (HCC)    Diastolic dysfunction    a.) TTE 06/25/2016: EF 60-654%. no RWMAs, G1DD, mild AoV annular calc; b.) TTE 03/19/2017: EF 50-55%, no RWMAs, mild MR; c.) TTE 07/22/2019: EF 55-60%, midl LVH, no RWMAs, triv MR, mild TR   Dyspnea    GERD (gastroesophageal reflux disease)    Glaucoma    Headache    History of bilateral cataract extraction    HLD (hyperlipidemia)    HTN (hypertension)    Long term current use of clopidogrel    Long-term use of aspirin therapy    MRSA (methicillin resistant staph aureus) culture positive    2006 - 2008   NSTEMI (non-ST elevated myocardial infarction) (HCC) 07/27/2007   a.) LHC 07/30/2007: 30% mLAD, 99% D1, 50% pLCx, 40% mLCx, 70/75/85% dLCx, 50% OM1, 75/80/80 OM2, 50% pRCA, 60% mRCA, 85% dRCA --> not amenable to PCI   OSA on CPAP    Osteoarthritis    PAD (peripheral artery disease) (HCC)    a. 03/2022 Lower Ext Angio: No signif Ao-iliac dzs. R PT diff dzs and occluded above ankle. R Pedal  arch intact w/ excellent antegrade flow provided by R AT-->no indication for revasc.   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   a.) LIMA-LAD, SVG-RCA, seq SVG-OM1-OM2   Skin cancer, basal cell    Skin ulcer of right great toe, unspecified ulcer stage (HCC)    T2DM (type 2 diabetes mellitus) (HCC)    Wears glasses    Wears partial dentures    top    Past Surgical History:  Procedure Laterality Date   ABDOMINAL AORTOGRAM W/LOWER EXTREMITY N/A 03/26/2022   Procedure: ABDOMINAL AORTOGRAM W/LOWER EXTREMITY;  Surgeon: Iran Ouch, MD;  [facility]: [facility];  Service: Cardiovascular;  Laterality: N/A;   AMPUTATION TOE Bilateral 06/26/2023   Procedure: AMPUTATION TOE MPJ OF THE JOINT,PARTIAL TOE AMPUTUATION;  Surgeon: Felecia Shelling, DPM;  Location: [facility];  Service: Orthopedics/Podiatry;  Laterality: Bilateral;   APPENDECTOMY     CARDIAC CATHETERIZATION N/A 06/11/2016   Procedure: Left Heart Cath and Coronary Angiography;  Surgeon: Lamar Blinks, MD;  Location: [facility];  Service: Cardiovascular;  Laterality: N/A;   CARPAL TUNNEL RELEASE Left 04/24/2016   Procedure: CARPAL TUNNEL RELEASE;  Surgeon: Kennedy Bucker, MD;  Location: [facility];  Service: Orthopedics;  Laterality: Left;   CATARACT EXTRACTION W/ INTRAOCULAR LENS  IMPLANT,  BILATERAL Bilateral    CATARACT EXTRACTION, BILATERAL     R eye 07/16/12, L eye 08/13/12 - with lens implant in both eyes   CORONARY ARTERY BYPASS GRAFT N/A 06/27/2016   Procedure: CORONARY ARTERY BYPASS GRAFTING times four using left internal mammary artery and right leg saphenous vein;  Surgeon: Kerin Perna, MD;  Location[facility];  Service: Open Heart Surgery;  Laterality: N/A;   FOREARM FRACTURE SURGERY Left 1961   KNEE ARTHROPLASTY Left 02/11/2016   Procedure: COMPUTER ASSISTED TOTAL KNEE ARTHROPLASTY;  Surgeon: Donato Heinz, MD;  Location: [facility];  Service: Orthopedics;  Laterality: Left;   KNEE ARTHROSCOPY Right 2000   LEFT HEART CATH AND  CORONARY ANGIOGRAPHY Left 07/30/2007   Procedure: LEFT HEART CATH AND CORONARY ANGIOGRAPHY; Location: [facility]; Surgeon: Arnoldo Hooker, MD   LEFT HEART CATH AND CORS/GRAFTS ANGIOGRAPHY Left 01/13/2017   Procedure: Left Heart Cath and Cors/Grafts Angiography;  Surgeon: Lamar Blinks, MD;  Location: [facility];  Service: Cardiovascular;  Laterality: Left;   LEFT HEART CATH AND CORS/GRAFTS ANGIOGRAPHY Left 06/09/2023   Procedure: LEFT HEART CATH AND CORS/GRAFTS ANGIOGRAPHY;  Surgeon: Marcina Millard, MD;  Location: [facility];  Service: Cardiovascular;  Laterality: Left;   METATARSAL HEAD EXCISION Right 03/21/2020   Procedure: METATARSAL HEAD RESECTION RIGHT AND DEBRIDEMENT OF ULCER;  Surgeon: Felecia Shelling, DPM;  Location: [facility];  Service: Podiatry;  Laterality: Right;   METATARSAL HEAD EXCISION Left 09/15/2023   Procedure: METATARSAL HEAD EXCISION;  Surgeon: Felecia Shelling, DPM;  Location: [facility];  Service: Orthopedics/Podiatry;  Laterality: Left;   MULTIPLE TOOTH EXTRACTIONS     TEE WITHOUT CARDIOVERSION N/A 06/27/2016   Procedure: TRANSESOPHAGEAL ECHOCARDIOGRAM (TEE);  Surgeon: Kerin Perna, MD;  Location: [facility] OR;  Service: Open Heart Surgery;  Laterality: N/A;    Allergies  Allergen Reactions   Latex Other (See Comments)    Blister and burning (17M insulin pump latex band aid )   Nitroglycerin Nausea Only and Other (See Comments)    Patches only - headache    Statins Other (See Comments)    BODY PAIN, Pt taking Crestor*      Fluvastatin Other (See Comments)    Other reaction(s): Muscle pain   Fluvastatin Sodium     Other reaction(s): Myalgias   Rosuvastatin Other (See Comments)    Other reaction(s): Muscle pain, Myalgias   Simvastatin Other (See Comments)    Other reaction(s): Muscle pain, Myalgias   Ezetimibe Other (See Comments)    Other reaction(s): Muscle Pain   Lipitor [Atorvastatin] Other (See Comments) and Rash    BODY PAIN   Lisinopril  Other (See Comments) and Nausea Only    Light headed, dizzy Other reaction(s): Dizziness   Objective/Physical Exam Stable.  Staples are intact.  Incision well coapted.  Ulcers noted to the distal tip of the left great toe as well as the left fourth toe and right third toe with granular wound base.  No purulence.  No exposed bone.  Each wound measures approximately 1.0 x 1.0 x 0.2 cm.  Radiographic Exam LT foot 09/22/2023:  Interval amputation of the second and third digits left foot as well as the great toe and second digit to the right foot with resection of the fifth metatarsal head as well.  Clean osteotomies.  No gas within the tissue.  Assessment: 1. s/p amputation left third and right second. DOS: 06/26/2023; healed 2. PSxHx amputation RT great toe and 5th met head  resection. LT second. 3.  Diabetes mellitus with peripheral polyneuropathy 4.  Ulcer left great toe and left fourth toe as well as the right third toe.  Ulcer plantar aspect of the fifth MTP left  Plan of Care:  -Patient was evaluated.  Dressings changed again.  Plan to leave them clean dry and intact for another week - Diabetic shoes pending.  Order placed for diabetic shoes with custom molded Plastizote insoles on 08/07/2023. -Medically necessary excisional debridement including subcutaneous tissue was performed today using a tissue nipper.  Excisional debridement of the necrotic nonviable tissue down to healthier bleeding viable tissue was performed with postdebridement measurement same as pre- -Prisma and Hydrofera Blue with dry sterile dressings were applied.  Leave clean dry and intact x 1 week -WBAT surgical shoes bilateral -Return to clinic 1 week  Felecia Shelling, DPM [facility]  Dr. Felecia Shelling, DPM    [address] [place], [place] [address]                Office [phone]  Fax [phone]

## 2023-10-13 ENCOUNTER — Encounter: Payer: Self-pay | Admitting: Podiatry

## 2023-10-13 ENCOUNTER — Ambulatory Visit (INDEPENDENT_AMBULATORY_CARE_PROVIDER_SITE_OTHER): Payer: Medicare Other | Admitting: Podiatry

## 2023-10-13 DIAGNOSIS — L97512 Non-pressure chronic ulcer of other part of right foot with fat layer exposed: Secondary | ICD-10-CM | POA: Diagnosis not present

## 2023-10-13 DIAGNOSIS — Z89422 Acquired absence of other left toe(s): Secondary | ICD-10-CM

## 2023-10-13 DIAGNOSIS — E0843 Diabetes mellitus due to underlying condition with diabetic autonomic (poly)neuropathy: Secondary | ICD-10-CM

## 2023-10-14 ENCOUNTER — Telehealth: Payer: Self-pay | Admitting: Podiatry

## 2023-10-14 NOTE — Telephone Encounter (Signed)
Patient has called back requesting this be sent in.  Thanks

## 2023-10-14 NOTE — Telephone Encounter (Signed)
Patient called requesting the medication be sent into their pharmacy from yesterdays visit.

## 2023-10-15 ENCOUNTER — Other Ambulatory Visit: Payer: Self-pay | Admitting: Podiatry

## 2023-10-15 ENCOUNTER — Telehealth: Payer: Self-pay | Admitting: Podiatry

## 2023-10-15 MED ORDER — DOXYCYCLINE HYCLATE 100 MG PO TABS: 100.0000 mg | ORAL_TABLET | Freq: Two times a day (BID) | ORAL | 0 refills | Status: DC

## 2023-10-15 NOTE — Telephone Encounter (Signed)
Pt called Tuesday, medication has not been sent in to pharmacy. Pt needs immediately due infection

## 2023-10-16 MED ORDER — SULFAMETHOXAZOLE-TRIMETHOPRIM 800-160 MG PO TABS: 1.0000 | ORAL_TABLET | Freq: Two times a day (BID) | ORAL | 0 refills | Status: DC

## 2023-10-16 MED ORDER — CIPROFLOXACIN HCL 500 MG PO TABS: 500.0000 mg | ORAL_TABLET | Freq: Two times a day (BID) | ORAL | 0 refills | Status: AC

## 2023-10-16 NOTE — Progress Notes (Addendum)
Chief Complaint  Patient presents with   Routine Post Op    POV #3 DOS 09/15/2023 5TH MET HEAD RESECTION LT Wife stated, "I think they are doing better."    Subjective:  Patient presents today status post fifth metatarsal head resection of the left foot.  DOS: 09/15/2023.  Patient doing well.  Dressings are clean dry and intact.  WBAT surgical shoes bilateral.   Past Medical History:  Diagnosis Date   Allergies    Anginal pain (HCC)    Bilateral carotid artery disease (HCC)    CAD (coronary artery disease) 07/2007   a.) s/p NSTEMI 07/2007 - sig multi-vessel CAD not amenable to PCI; b.) LHC 06/11/2016: multi vessel CAD not amenable to PCI --> refer to CVTS; c.) s/p 4v CABG 06/27/2016; d.) LHC 06/09/2023: 75% mLM, 80% o-pLAD, 60% mLAD, 100% p-mLCx, 100% OM1, 95% pRCA, 100% mRCA, 50% m-dLAD. LIMA-LAD and SVG-OM1 patent. CTO SVG-RCA with L-R collaterals - med mgmt   Carpal tunnel syndrome on left    a.) s/p release 04/2016   CKD (chronic kidney disease), stage III (HCC)    Diabetic peripheral neuropathy (HCC)    Diastolic dysfunction    a.) TTE 06/25/2016: EF 60-654%. no RWMAs, G1DD, mild AoV annular calc; b.) TTE 03/19/2017: EF 50-55%, no RWMAs, mild MR; c.) TTE 07/22/2019: EF 55-60%, midl LVH, no RWMAs, triv MR, mild TR   Dyspnea    GERD (gastroesophageal reflux disease)    Glaucoma    Headache    History of bilateral cataract extraction    HLD (hyperlipidemia)    HTN (hypertension)    Long term current use of clopidogrel    Long-term use of aspirin therapy    MRSA (methicillin resistant staph aureus) culture positive    2006 - 2008   NSTEMI (non-ST elevated myocardial infarction) (HCC) 07/27/2007   a.) LHC 07/30/2007: 30% mLAD, 99% D1, 50% pLCx, 40% mLCx, 70/75/85% dLCx, 50% OM1, 75/80/80 OM2, 50% pRCA, 60% mRCA, 85% dRCA --> not amenable to PCI   OSA on CPAP    Osteoarthritis    PAD (peripheral artery disease) (HCC)    a. 03/2022 Lower Ext Angio: No signif Ao-iliac dzs. R PT  diff dzs and occluded above ankle. R Pedal arch intact w/ excellent antegrade flow provided by R AT-->no indication for revasc.   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   a.) LIMA-LAD, SVG-RCA, seq SVG-OM1-OM2   Skin cancer, basal cell    Skin ulcer of right great toe, unspecified ulcer stage (HCC)    T2DM (type 2 diabetes mellitus) (HCC)    Wears glasses    Wears partial dentures    top    Past Surgical History:  Procedure Laterality Date   ABDOMINAL AORTOGRAM W/LOWER EXTREMITY N/A 03/26/2022   Procedure: ABDOMINAL AORTOGRAM W/LOWER EXTREMITY;  Surgeon: Iran Ouch, MD;  Location: [facility];  Service: Cardiovascular;  Laterality: N/A;   AMPUTATION TOE Bilateral 06/26/2023   Procedure: AMPUTATION TOE MPJ OF THE JOINT,PARTIAL TOE AMPUTUATION;  Surgeon: Felecia Shelling, DPM;  Location: [facility];  Service: Orthopedics/Podiatry;  Laterality: Bilateral;   APPENDECTOMY     CARDIAC CATHETERIZATION N/A 06/11/2016   Procedure: Left Heart Cath and Coronary Angiography;  Surgeon: Lamar Blinks, MD;  Location: [facility];  Service: Cardiovascular;  Laterality: N/A;   CARPAL TUNNEL RELEASE Left 04/24/2016   Procedure: CARPAL TUNNEL RELEASE;  Surgeon: Kennedy Bucker, MD;  Location: [facility];  Service: Orthopedics;  Laterality: Left;  CATARACT EXTRACTION W/ INTRAOCULAR LENS  IMPLANT, BILATERAL Bilateral    CATARACT EXTRACTION, BILATERAL     R eye 07/16/12, L eye 08/13/12 - with lens implant in both eyes   CORONARY ARTERY BYPASS GRAFT N/A 06/27/2016   Procedure: CORONARY ARTERY BYPASS GRAFTING times four using left internal mammary artery and right leg saphenous vein;  Surgeon: Kerin Perna, MD;  Location: [facility] OR;  Service: Open Heart Surgery;  Laterality: N/A;   FOREARM FRACTURE SURGERY Left 1961   KNEE ARTHROPLASTY Left 02/11/2016   Procedure: COMPUTER ASSISTED TOTAL KNEE ARTHROPLASTY;  Surgeon: Donato Heinz, MD;  Location: [facility];  Service: Orthopedics;  Laterality: Left;   KNEE  ARTHROSCOPY Right 2000   LEFT HEART CATH AND CORONARY ANGIOGRAPHY Left 07/30/2007   Procedure: LEFT HEART CATH AND CORONARY ANGIOGRAPHY; Location: [facility]; Surgeon: Arnoldo Hooker, MD   LEFT HEART CATH AND CORS/GRAFTS ANGIOGRAPHY Left 01/13/2017   Procedure: Left Heart Cath and Cors/Grafts Angiography;  Surgeon: Lamar Blinks, MD;  Location: [facility];  Service: Cardiovascular;  Laterality: Left;   LEFT HEART CATH AND CORS/GRAFTS ANGIOGRAPHY Left 06/09/2023   Procedure: LEFT HEART CATH AND CORS/GRAFTS ANGIOGRAPHY;  Surgeon: Marcina Millard, MD;  Location: [facility];  Service: Cardiovascular;  Laterality: Left;   METATARSAL HEAD EXCISION Right 03/21/2020   Procedure: METATARSAL HEAD RESECTION RIGHT AND DEBRIDEMENT OF ULCER;  Surgeon: Felecia Shelling, DPM;  Location: [facility];  Service: Podiatry;  Laterality: Right;   METATARSAL HEAD EXCISION Left 09/15/2023   Procedure: METATARSAL HEAD EXCISION;  Surgeon: Felecia Shelling, DPM;  Location: [facility];  Service: Orthopedics/Podiatry;  Laterality: Left;   MULTIPLE TOOTH EXTRACTIONS     TEE WITHOUT CARDIOVERSION N/A 06/27/2016   Procedure: TRANSESOPHAGEAL ECHOCARDIOGRAM (TEE);  Surgeon: Kerin Perna, MD;  Location: [facility] OR;  Service: Open Heart Surgery;  Laterality: N/A;    Allergies  Allergen Reactions   Latex Other (See Comments)    Blister and burning (55M insulin pump latex band aid )   Nitroglycerin Nausea Only and Other (See Comments)    Patches only - headache    Statins Other (See Comments)    BODY PAIN, Pt taking Crestor*      Fluvastatin Other (See Comments)    Other reaction(s): Muscle pain   Fluvastatin Sodium     Other reaction(s): Myalgias   Rosuvastatin Other (See Comments)    Other reaction(s): Muscle pain, Myalgias   Simvastatin Other (See Comments)    Other reaction(s): Muscle pain, Myalgias   Ezetimibe Other (See Comments)    Other reaction(s): Muscle Pain   Lipitor [Atorvastatin] Other (See  Comments) and Rash    BODY PAIN   Lisinopril Other (See Comments) and Nausea Only    Light headed, dizzy Other reaction(s): Dizziness   Objective/Physical Exam There continues to be some deterioration of the ulcers noted to the distal tips of the toes.  Progressive deterioration noted to the wounds today compared to prior visit.  Staples are intact.  Incision well coapted and healing routinely.  Ulcers noted to the distal tip of the left great toe as well as the left fourth toe and right third toe with granular wound base.  No purulence.  No exposed bone.  Each wound measures approximately 1.0 x 1.0 x 0.2 cm.  Radiographic Exam LT foot 09/22/2023:  Interval amputation of the second and third digits left foot as well as the great toe and second digit to the right foot with resection of  the fifth metatarsal head as well.  Clean osteotomies.  No gas within the tissue.  Assessment: 1. s/p amputation left third and right second. DOS: 06/26/2023; healed 2. PSxHx amputation RT great toe and 5th met head resection. LT second. 3.  Diabetes mellitus with peripheral polyneuropathy 4.  Ulcer left great toe and left fourth toe as well as the right third toe.  Ulcer plantar aspect of the fifth MTP left  Plan of Care:  -Patient was evaluated.  Staples removed - Diabetic shoes pending.  Order placed for diabetic shoes with custom molded Plastizote insoles on 08/07/2023. -Medically necessary excisional debridement including subcutaneous tissue was performed today using a tissue nipper.  Excisional debridement of the necrotic nonviable tissue down to healthier bleeding viable tissue was performed with postdebridement measurement same as pre- -Prisma and Hydrofera Blue with dry sterile dressings were applied.   -WBAT surgical shoes bilateral -Recommend referral to wound care. Patient is established at the wound care and will contact them for an appointment - Due to the advanced deterioration of the wounds and  nonprogressive nature of the wounds which appear to be worsening I did call in a prescription for Bactrim DS as well as ciprofloxacin 500 mg x 10 days. -Return to clinic 2 weeks  Felecia Shelling, DPM [facility]  Dr. Felecia Shelling, DPM    [address] [place], [place] [address]                Office [phone]  Fax [phone]

## 2023-10-16 NOTE — Addendum Note (Signed)
Addended by: Felecia Shelling on: 10/16/2023 07:26 AM   Modules accepted: Orders

## 2023-10-19 ENCOUNTER — Telehealth: Payer: Self-pay | Admitting: Podiatry

## 2023-10-19 NOTE — Telephone Encounter (Signed)
Patients wife called asking if he needs his antibiotics another round due to his appt withg you is not until the 31st and the wound center can't see him until 11/17/2023. Wife states its starting to look a little better.

## 2023-10-20 ENCOUNTER — Ambulatory Visit (INDEPENDENT_AMBULATORY_CARE_PROVIDER_SITE_OTHER): Payer: Medicare Other

## 2023-10-20 VITALS — Ht 70.5 in | Wt 290.0 lb

## 2023-10-20 DIAGNOSIS — Z0000 Encounter for general adult medical examination without abnormal findings: Secondary | ICD-10-CM

## 2023-10-20 DIAGNOSIS — Z1211 Encounter for screening for malignant neoplasm of colon: Secondary | ICD-10-CM

## 2023-10-20 NOTE — Patient Instructions (Addendum)
Lawrence [name] , Thank you for taking time to come for your Medicare Wellness Visit. I appreciate your ongoing commitment to your health goals. Please review the following plan we discussed and let me know if I can assist you in the future.   Referrals/Orders/Follow-Ups/Clinician Recommendations:   An order has been placed for a Cologuard for you. They will mail you the kit with instructions on how to obtain the sample and send it back in to be tested. If you do not received your kit, please call our office and let us know.    This is a list of the screening recommended for you and due dates:  Health Maintenance  Topic Date Due   Yearly kidney health urinalysis for diabetes  10/21/2023*   COVID-19 Vaccine (4 - 2024-25 season) 12/04/2023*   Cologuard (Stool DNA test)  02/01/2024*   DTaP/Tdap/Td vaccine (5 - Td or Tdap) 10/19/2024*   Eye exam for diabetics  12/20/2023   Hemoglobin A1C  03/01/2024   Yearly kidney function blood test for diabetes  09/08/2024   Complete foot exam   09/21/2024   Medicare Annual Wellness Visit  10/19/2024   Pneumonia Vaccine  Completed   Flu Shot  Completed   Hepatitis C Screening  Completed   Zoster (Shingles) Vaccine  Completed   HPV Vaccine  Aged Out  *Topic was postponed. The date shown is not the original due date.    Advanced directives: (Copy Requested) Please bring a copy of your health care power of attorney and living will to the office to be added to your chart at your convenience.  Next Medicare Annual Wellness Visit scheduled for next year: Yes 10/20/24 @ 1pm telephone

## 2023-10-20 NOTE — Progress Notes (Signed)
Subjective:   Lawrence [name] is a 72 y.o. male who presents for Medicare Annual/Subsequent preventive examination.  Visit Complete: Virtual I connected with  Lawrence [name] on 10/20/23 by a audio enabled telemedicine application and verified that I am speaking with the correct person using two identifiers.  Patient Location: Home  Provider Location: Office/Clinic  I discussed the limitations of evaluation and management by telemedicine. The patient expressed understanding and agreed to proceed.  Vital Signs: Because this visit was a virtual/telehealth visit, some criteria may be missing or patient reported. Any vitals not documented were not able to be obtained and vitals that have been documented are patient reported.  Patient Medicare AWV questionnaire was completed by the patient on (not done); I have confirmed that all information answered by patient is correct and no changes since this date.  Cardiac Risk Factors include: advanced age (>19men, >88 women);obesity (BMI >30kg/m2);sedentary lifestyle;hypertension;diabetes mellitus;dyslipidemia;male gender    Objective:    Today's Vitals   10/20/23 1305  Weight: 290 lb (131.5 kg)  Height: 5' 10.5" (1.791 m)   Body mass index is 41.02 kg/m.     10/20/2023    1:28 PM 09/15/2023   10:20 AM 09/08/2023    2:32 PM 06/26/2023    2:05 PM 06/25/2023   10:02 AM 06/09/2023    7:30 AM 10/15/2022    2:35 PM  Advanced Directives  Does Patient Have a Medical Advance Directive? Yes Yes Yes Yes Yes Yes Yes  Type of Estate agent of [place];Living will Healthcare Power of [place];Living will Healthcare Power of [place];Living will Living will;Healthcare Power of [employer] Power of [place];Living will Healthcare Power of [place];Living will  Does patient want to make changes to medical advance directive?  No - Patient declined No - Patient declined      Copy of Healthcare Power of Attorney in Chart? No -  copy requested No - copy requested No - copy requested   No - copy requested No - copy requested    Current Medications (verified) Outpatient Encounter Medications as of 10/20/2023  Medication Sig   acetaminophen (TYLENOL) 500 MG tablet Take 1,000 mg by mouth every 6 (six) hours as needed for moderate pain (pain score 4-6).   aspirin 81 MG EC tablet Take 81 mg by mouth daily. Swallow whole.   CALCIUM PO Take 600 mg by mouth daily. 600/20iu   carvedilol (COREG) 12.5 MG tablet Take 12.5 mg by mouth 2 (two) times daily with a meal.   Cholecalciferol (VITAMIN D) 50 MCG (2000 UT) tablet Take 2,000 Units by mouth in the morning and at bedtime.   ciprofloxacin (CIPRO) 500 MG tablet Take 1 tablet (500 mg total) by mouth 2 (two) times daily for 10 days.   clopidogrel (PLAVIX) 75 MG tablet Take 1 tablet (75 mg total) by mouth daily.   famotidine (PEPCID) 20 MG tablet Take 1 tablet (20 mg total) by mouth daily.   fluticasone (FLONASE) 50 MCG/ACT nasal spray Place 1 spray into both nostrils daily as needed for allergies.   furosemide (LASIX) 40 MG tablet Take 1 tablet (40 mg total) by mouth daily.   glucagon 1 MG injection Inject 1 mg into the skin once as needed.   GLUTOSE 15 40 % GEL Take 1 Tube by mouth once as needed for low blood sugar.   insulin aspart (NOVOLOG) 100 UNIT/ML injection Inject 20 Units into the skin 3 (three) times daily. (Patient taking differently: Inject 10-20 Units into  the skin 3 (three) times daily with meals. Sliding Scale will go up to 25 units for BG > 300 - 400)   insulin glargine-yfgn (SEMGLEE) 100 UNIT/ML injection Inject 20 Units into the skin at bedtime.   isosorbide mononitrate (IMDUR) 60 MG 24 hr tablet Take 60 mg by mouth in the morning and at bedtime.   loratadine (CLARITIN) 10 MG tablet Take 10 mg by mouth daily.   nitroGLYCERIN (NITROSTAT) 0.4 MG SL tablet DISSOLVE 1 TABLET UNDER THE TONGUE EVERY 5 MINUTES AS NEEDED FOR CHEST PAIN   potassium chloride SA  (K-DUR,KLOR-CON) 20 MEQ tablet Take 1 tablet (20 mEq total) by mouth daily.   rosuvastatin (CRESTOR) 20 MG tablet Take 1 tablet (20 mg total) by mouth daily at 6 PM.   sulfamethoxazole-trimethoprim (BACTRIM DS) 800-160 MG tablet Take 1 tablet by mouth 2 (two) times daily.   Travoprost, BAK Free, (TRAVATAN) 0.004 % SOLN ophthalmic solution Place 1 drop into both eyes at bedtime.    vitamin C (ASCORBIC ACID) 500 MG tablet Take 500 mg by mouth 2 (two) times daily.    AIMOVIG 140 MG/ML SOAJ Inject 140 mg into the skin every 30 (thirty) days. (Patient not taking: Reported on 09/29/2023)   albuterol (VENTOLIN HFA) 108 (90 Base) MCG/ACT inhaler Inhale 2 puffs into the lungs every 4 (four) hours as needed for wheezing. (Patient not taking: Reported on 09/29/2023)   No facility-administered encounter medications on file as of 10/20/2023.    Allergies (verified) Latex, Nitroglycerin, Statins, Fluvastatin, Fluvastatin sodium, Rosuvastatin, Simvastatin, Ezetimibe, Lipitor [atorvastatin], and Lisinopril   History: Past Medical History:  Diagnosis Date   Allergies    Anginal pain (HCC)    Bilateral carotid artery disease (HCC)    CAD (coronary artery disease) 07/2007   a.) s/p NSTEMI 07/2007 - sig multi-vessel CAD not amenable to PCI; b.) LHC 06/11/2016: multi vessel CAD not amenable to PCI --> refer to CVTS; c.) s/p 4v CABG 06/27/2016; d.) LHC 06/09/2023: 75% mLM, 80% o-pLAD, 60% mLAD, 100% p-mLCx, 100% OM1, 95% pRCA, 100% mRCA, 50% m-dLAD. LIMA-LAD and SVG-OM1 patent. CTO SVG-RCA with L-R collaterals - med mgmt   Carpal tunnel syndrome on left    a.) s/p release 04/2016   CKD (chronic kidney disease), stage III (HCC)    Diabetic peripheral neuropathy (HCC)    Diastolic dysfunction    a.) TTE 06/25/2016: EF 60-654%. no RWMAs, G1DD, mild AoV annular calc; b.) TTE 03/19/2017: EF 50-55%, no RWMAs, mild MR; c.) TTE 07/22/2019: EF 55-60%, midl LVH, no RWMAs, triv MR, mild TR   Dyspnea    GERD  (gastroesophageal reflux disease)    Glaucoma    Headache    History of bilateral cataract extraction    HLD (hyperlipidemia)    HTN (hypertension)    Long term current use of clopidogrel    Long-term use of aspirin therapy    MRSA (methicillin resistant staph aureus) culture positive    2006 - 2008   NSTEMI (non-ST elevated myocardial infarction) (HCC) 07/27/2007   a.) LHC 07/30/2007: 30% mLAD, 99% D1, 50% pLCx, 40% mLCx, 70/75/85% dLCx, 50% OM1, 75/80/80 OM2, 50% pRCA, 60% mRCA, 85% dRCA --> not amenable to PCI   OSA on CPAP    Osteoarthritis    PAD (peripheral artery disease) (HCC)    a. 03/2022 Lower Ext Angio: No signif Ao-iliac dzs. R PT diff dzs and occluded above ankle. R Pedal arch intact w/ excellent antegrade flow provided by R AT-->no indication for revasc.  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   a.) LIMA-LAD, SVG-RCA, seq SVG-OM1-OM2   Skin cancer, basal cell    Skin ulcer of right great toe, unspecified ulcer stage (HCC)    T2DM (type 2 diabetes mellitus) (HCC)    Wears glasses    Wears partial dentures    top   Past Surgical History:  Procedure Laterality Date   ABDOMINAL AORTOGRAM W/LOWER EXTREMITY N/A 03/26/2022   Procedure: ABDOMINAL AORTOGRAM W/LOWER EXTREMITY;  Surgeon: Iran Ouch, MD;  Location: [facility];  Service: Cardiovascular;  Laterality: N/A;   AMPUTATION TOE Bilateral 06/26/2023   Procedure: AMPUTATION TOE MPJ OF THE JOINT,PARTIAL TOE AMPUTUATION;  Surgeon: Felecia Shelling, DPM;  Location: [facility] OR[facility];  Service: Orthopedics/Podiatry;  Laterality: Bilateral;   APPENDECTOMY     CARDIAC CATHETERIZATION N/A 06/11/2016   Procedure: Left Heart Cath and Coronary Angiography;  Surgeon: Lamar Blinks, MD;  Location: [facility];  Service: Cardiovascular;  Laterality: N/A;   CARPAL TUNNEL RELEASE Left 04/24/2016   Procedure: CARPAL TUNNEL RELEASE;  Surgeon: Kennedy Bucker, MD;  Location: [facility];  Service: Orthopedics;  Laterality: Left;   CATARACT  EXTRACTION W/ INTRAOCULAR LENS  IMPLANT, BILATERAL Bilateral    CATARACT EXTRACTION, BILATERAL     R eye 07/16/12, L eye 08/13/12 - with lens implant in both eyes   CORONARY ARTERY BYPASS GRAFT N/A 06/27/2016   Procedure: CORONARY ARTERY BYPASS GRAFTING times four using left internal mammary artery and right leg saphenous vein;  Surgeon: Kerin Perna, MD;  Location: [facility] OR;  Service: Open Heart Surgery;  Laterality: N/A;   FOREARM FRACTURE SURGERY Left 1961   KNEE ARTHROPLASTY Left 02/11/2016   Procedure: COMPUTER ASSISTED TOTAL KNEE ARTHROPLASTY;  Surgeon: Donato Heinz, MD;  Location: [facility] OR[facility];  Service: Orthopedics;  Laterality: Left;   KNEE ARTHROSCOPY Right 2000   LEFT HEART CATH AND CORONARY ANGIOGRAPHY Left 07/30/2007   Procedure: LEFT HEART CATH AND CORONARY ANGIOGRAPHY; Location: [facility]; Surgeon: Arnoldo Hooker, MD   LEFT HEART CATH AND CORS/GRAFTS ANGIOGRAPHY Left 01/13/2017   Procedure: Left Heart Cath and Cors/Grafts Angiography;  Surgeon: Lamar Blinks, MD;  Location: [facility];  Service: Cardiovascular;  Laterality: Left;   LEFT HEART CATH AND CORS/GRAFTS ANGIOGRAPHY Left 06/09/2023   Procedure: LEFT HEART CATH AND CORS/GRAFTS ANGIOGRAPHY;  Surgeon: Marcina Millard, MD;  Location: [facility];  Service: Cardiovascular;  Laterality: Left;   METATARSAL HEAD EXCISION Right 03/21/2020   Procedure: METATARSAL HEAD RESECTION RIGHT AND DEBRIDEMENT OF ULCER;  Surgeon: Felecia Shelling, DPM;  Location: [facility];  Service: Podiatry;  Laterality: Right;   METATARSAL HEAD EXCISION Left 09/15/2023   Procedure: METATARSAL HEAD EXCISION;  Surgeon: Felecia Shelling, DPM;  Location: [facility];  Service: Orthopedics/Podiatry;  Laterality: Left;   MULTIPLE TOOTH EXTRACTIONS     TEE WITHOUT CARDIOVERSION N/A 06/27/2016   Procedure: TRANSESOPHAGEAL ECHOCARDIOGRAM (TEE);  Surgeon: Kerin Perna, MD;  Location: [facility] OR;  Service: Open Heart Surgery;  Laterality: N/A;   Family  History  Problem Relation Age of Onset   Hypertension Mother    Lupus Mother    Heart Problems Mother    Hypertension Father    Diabetes Father    Heart attack Father 7       died in his 26s   Drug abuse Brother    Heart disease Brother    Heart attack Maternal Grandmother    Arthritis Maternal Grandmother    Arthritis Maternal  Grandfather    Heart attack Paternal Grandmother    Arthritis Paternal Grandmother    Arthritis Paternal Grandfather    Social History   Socioeconomic History   Marital status: Married    Spouse name: Marut,Lajuana   Number of children: Not on file   Years of education: Not on file   Highest education level: Not on file  Occupational History   Not on file  Tobacco Use   Smoking status: Former    Current packs/day: 0.00    Average packs/day: 1 pack/day for 16.7 years (16.7 ttl pk-yrs)    Types: Cigarettes    Start date: 11/03/1969    Quit date: 07/22/1986    Years since quitting: 37.2   Smokeless tobacco: Never  Vaping Use   Vaping status: Never Used  Substance and Sexual Activity   Alcohol use: Not Currently   Drug use: No   Sexual activity: Never  Other Topics Concern   Not on file  Social History Narrative   Married.  Lives at home with wife.    Social Drivers of Corporate investment banker Strain: Low Risk  (10/20/2023)   Overall Financial Resource Strain (CARDIA)    Difficulty of Paying Living Expenses: Not very hard  Food Insecurity: No Food Insecurity (10/20/2023)   Hunger Vital Sign    Worried About Running Out of Food in the Last Year: Never true    Ran Out of Food in the Last Year: Never true  Transportation Needs: No Transportation Needs (10/20/2023)   PRAPARE - Administrator, Civil Service (Medical): No    Lack of Transportation (Non-Medical): No  Physical Activity: Inactive (10/20/2023)   Exercise Vital Sign    Days of Exercise per Week: 0 days    Minutes of Exercise per Session: 0 min  Stress: No Stress  Concern Present (10/20/2023)   [employer] of Occupational Health - Occupational Stress Questionnaire    Feeling of Stress : Only a little  Social Connections: Moderately Integrated (10/20/2023)   Social Connection and Isolation Panel [NHANES]    Frequency of Communication with Friends and Family: More than three times a week    Frequency of Social Gatherings with Friends and Family: Twice a week    Attends Religious Services: 1 to 4 times per year    Active Member of [employer] or Organizations: No    Attends Engineer, structural: Never    Marital Status: Married    Tobacco Counseling Counseling given: Not Answered  Clinical Intake:  Pre-visit preparation completed: Yes  Pain : No/denies pain   BMI - recorded: 41.02 Nutritional Status: BMI > 30  Obese Nutritional Risks: None Diabetes: Yes CBG done?: Yes (BS 117 this am at home) CBG resulted in Enter/ Edit results?: No Did pt. bring in CBG monitor from home?: No  How often do you need to have someone help you when you read instructions, pamphlets, or other written materials from your doctor or pharmacy?: 1 - Never  Interpreter Needed?: No  Comments: lives alone Information entered by :: B.Cavan Bearden,LPN   Activities of Daily Living    10/20/2023    1:29 PM 09/08/2023    2:35 PM  In your present state of health, do you have any difficulty performing the following activities:  Hearing? 1   Vision? 1   Difficulty concentrating or making decisions? 1   Walking or climbing stairs? 1   Dressing or bathing? 0   Doing errands, shopping? 0 0  Preparing Food and eating ? N   Using the Toilet? N   In the past six months, have you accidently leaked urine? Y   Do you have problems with loss of bowel control? N   Managing your Medications? N   Managing your Finances? N   Housekeeping or managing your Housekeeping? N     Patient Care Team: Doreene Nest, NP as PCP - General (Internal Medicine)  Indicate any  recent Medical Services you may have received from other than [facility] providers in the past year (date may be approximate).     Assessment:   This is a routine wellness examination for [place].  Hearing/Vision screen Hearing Screening - Comments:: Pt says his hearing is impaired some in rt ear;lft ear ok Vision Screening - Comments:: Pt says his vision is alright w/glasses for rt eye;lft eye diminished vision Dr Druscilla Brownie   Goals Addressed             This Visit's Progress    COMPLETED: Increase water intake   On track    Starting 09/05/2016, I will attempt to drink at least 6-8 glasses of water daily.      COMPLETED: Patient Stated   On track    Starting 04/19/2018, I will continue to take medications as prescribed.      Patient Stated   Not on track    Would like to continue to lose weight      Patient Stated   Not on track    Stay healthy and active      Patient Stated       Pt says he would like to live to see grand graduate       Depression Screen    10/20/2023    1:21 PM 12/22/2022   12:30 PM 12/09/2022    4:08 PM 12/09/2022    2:06 PM 10/15/2022    2:29 PM 06/18/2022   11:13 AM 10/10/2021    9:22 AM  PHQ 2/9 Scores  PHQ - 2 Score 2 2 0 2 2 0 0  PHQ- 9 Score 8 9  7 7  0     Fall Risk    10/20/2023    1:13 PM 12/22/2022   12:30 PM 12/09/2022    2:06 PM 10/15/2022    2:36 PM 06/18/2022   11:14 AM  Fall Risk   Falls in the past year? 1 1 1 1  0  Number falls in past yr: 1 0 0 1 0  Injury with Fall? 0 0 0 0 0  Risk for fall due to : Impaired balance/gait;History of fall(s) History of fall(s) History of fall(s) Impaired mobility;Impaired balance/gait;Impaired vision Impaired balance/gait  Follow up Education provided;Falls prevention discussed Falls evaluation completed Falls evaluation completed Falls prevention discussed Falls evaluation completed    MEDICARE RISK AT HOME: Medicare Risk at Home Any stairs in or around the home?: Yes If so, are there any without  handrails?: Yes Home free of loose throw rugs in walkways, pet beds, electrical cords, etc?: Yes Adequate lighting in your home to reduce risk of falls?: Yes Life alert?: Yes Use of a cane, walker or w/c?: Yes (cane) Grab bars in the bathroom?: Yes Shower chair or bench in shower?: Yes Elevated toilet seat or a handicapped toilet?: Yes  TIMED UP AND GO:  Was the test performed?  No    Cognitive Function:    04/19/2018   10:02 AM 09/05/2016    1:42 PM  MMSE - Mini  Mental State Exam  Orientation to time 5 5  Orientation to Place 5 5  Registration 3 3  Attention/ Calculation 0 0  Recall 3 3  Language- name 2 objects 0 0  Language- repeat 1 1  Language- follow 3 step command 3 3  Language- read & follow direction 0 0  Write a sentence 0 0  Copy design 0 0  Total score 20 20        10/20/2023    1:30 PM 10/15/2022    2:38 PM  6CIT Screen  What Year? 0 points 0 points  What month? 0 points 0 points  What time? 0 points 0 points  Count back from 20 0 points 0 points  Months in reverse 0 points 0 points  Repeat phrase 0 points 0 points  Total Score 0 points 0 points    Immunizations Immunization History  Administered Date(s) Administered   Fluad Quad(high Dose 65+) 07/11/2020, 07/18/2021, 07/09/2023   Fluad Trivalent(High Dose 65+) 07/20/2018   H1N1 10/19/2008   Influenza, High Dose Seasonal PF 08/06/2015, 08/29/2019   Influenza, Seasonal, Injecte, Preservative Fre 09/30/2010, 08/10/2012   Influenza,inj,Quad PF,6+ Mos 09/18/2017, 07/19/2018   Influenza-Unspecified 09/10/1998, 10/12/2000, 09/21/2001, 09/06/2002, 09/08/2003, 09/20/2004, 10/14/2005, 10/06/2006, 08/13/2007, 07/18/2008, 08/17/2009, 08/04/2011, 09/03/2013, 08/25/2014, 08/01/2016, 08/05/2017, 07/20/2018, 08/29/2019, 07/11/2020, 08/03/2021, 08/26/2022   Moderna Sars-Covid-2 Vaccination 04/13/2020, 05/07/2020, 11/14/2020   Pneumococcal Conjugate-13 01/09/2015   Pneumococcal Polysaccharide-23 02/05/1998,  02/20/2003, 08/10/2012, 11/24/2017   Pneumococcal-Unspecified 11/04/2015   Td 11/03/1992, 10/19/2008   Td (Adult),unspecified 11/03/1992, 10/19/2008   Tdap 10/19/2008   Zoster Recombinant(Shingrix) 05/13/2021, 07/09/2021   Zoster, Live 09/30/2011    TDAP status: Up to date  Flu Vaccine status: Up to date  Pneumococcal vaccine status: Up to date  Covid-19 vaccine status: Completed vaccines  Qualifies for Shingles Vaccine? Yes   Zostavax completed Yes   Shingrix Completed?: Yes  Screening Tests Health Maintenance  Topic Date Due   Diabetic kidney evaluation - Urine ACR  10/21/2023 (Originally 08/30/2023)   COVID-19 Vaccine (4 - 2024-25 season) 12/04/2023 (Originally 07/05/2023)   Fecal DNA (Cologuard)  02/01/2024 (Originally 10/23/2022)   DTaP/Tdap/Td (5 - Td or Tdap) 10/19/2024 (Originally 10/19/2018)   OPHTHALMOLOGY EXAM  12/20/2023   HEMOGLOBIN A1C  03/01/2024   Diabetic kidney evaluation - eGFR measurement  09/08/2024   FOOT EXAM  09/21/2024   Medicare Annual Wellness (AWV)  10/19/2024   Pneumonia Vaccine 49+ Years old  Completed   INFLUENZA VACCINE  Completed   Hepatitis C Screening  Completed   Zoster Vaccines- Shingrix  Completed   HPV VACCINES  Aged Out    Health Maintenance  There are no preventive care reminders to display for this patient.  Colorectal cancer screening: Type of screening: Cologuard. Completed 10/24/2019. Repeat every 3 years Ordered  Lung Cancer Screening: (Low Dose CT Chest recommended if Age 61-80 years, 20 pack-year currently smoking OR have quit w/in 15years.) does not qualify.   Lung Cancer Screening Referral: no  Additional Screening:  Hepatitis C Screening: does not qualify; Completed 09/08/2016  Vision Screening: Recommended annual ophthalmology exams for early detection of glaucoma and other disorders of the eye. Is the patient up to date with their annual eye exam?  Yes  Who is the provider or what is the name of the office in  which the patient attends annual eye exams? Dr Druscilla Brownie If pt is not established with a provider, would they like to be referred to a provider to establish care? No .  Dental Screening: Recommended annual dental exams for proper oral hygiene  Diabetic Foot Exam: Diabetic Foot Exam: Completed 09/22/23  Community Resource Referral / Chronic Care Management: CRR required this visit?  No   CCM required this visit?  Appt scheduled with PCP    Plan:     I have personally reviewed and noted the following in the patient's chart:   Medical and social history Use of alcohol, tobacco or illicit drugs  Current medications and supplements including opioid prescriptions. Patient is not currently taking opioid prescriptions. Functional ability and status Nutritional status Physical activity Advanced directives List of other physicians Hospitalizations, surgeries, and ER visits in previous 12 months Vitals Screenings to include cognitive, depression, and falls Referrals and appointments  In addition, I have reviewed and discussed with patient certain preventive protocols, quality metrics, and best practice recommendations. A written personalized care plan for preventive services as well as general preventive health recommendations were provided to patient.    Sue Lush, LPN   11/05/7251   After Visit Summary: (MyChart) Due to this being a telephonic visit, the after visit summary with patients personalized plan was offered to patient via MyChart   Nurse Notes: Pt says he is doing alright. Pt says he is getting wound care on his feet and being seen on [facility]. He has no concerns or questions at this time.

## 2023-10-22 ENCOUNTER — Encounter: Payer: Medicare Other | Attending: Physician Assistant | Admitting: Physician Assistant

## 2023-10-22 DIAGNOSIS — L97522 Non-pressure chronic ulcer of other part of left foot with fat layer exposed: Secondary | ICD-10-CM | POA: Insufficient documentation

## 2023-10-22 DIAGNOSIS — I120 Hypertensive chronic kidney disease with stage 5 chronic kidney disease or end stage renal disease: Secondary | ICD-10-CM | POA: Insufficient documentation

## 2023-10-22 DIAGNOSIS — E1040 Type 1 diabetes mellitus with diabetic neuropathy, unspecified: Secondary | ICD-10-CM | POA: Insufficient documentation

## 2023-10-22 DIAGNOSIS — I2510 Atherosclerotic heart disease of native coronary artery without angina pectoris: Secondary | ICD-10-CM | POA: Insufficient documentation

## 2023-10-22 DIAGNOSIS — N186 End stage renal disease: Secondary | ICD-10-CM | POA: Diagnosis not present

## 2023-10-22 DIAGNOSIS — L97512 Non-pressure chronic ulcer of other part of right foot with fat layer exposed: Secondary | ICD-10-CM | POA: Insufficient documentation

## 2023-10-22 DIAGNOSIS — E10621 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: Secondary | ICD-10-CM | POA: Insufficient documentation

## 2023-10-22 DIAGNOSIS — G4730 Sleep apnea, unspecified: Secondary | ICD-10-CM | POA: Diagnosis not present

## 2023-10-22 DIAGNOSIS — E1022 Type 1 diabetes mellitus with diabetic chronic kidney disease: Secondary | ICD-10-CM | POA: Insufficient documentation

## 2023-10-24 NOTE — Progress Notes (Signed)
[name] ([id number]) [id number] Page 1 of 14 Visit Report for 10/22/2023 Allergy List Details Patient Name: Date of Service: [name], [name] 10/22/2023 2:00 PM Medical Record Number: [id number] Patient Account Number: [id number] Date of Birth/Sex: Treating RN: [birth year] (72 y.o. [name] Primary Care Kaede Clendenen: [name] Other Clinician: Referring Renesme Kerrigan: Treating Ravyn Nikkel/Extender: [name] in Treatment: 0 Allergies Active Allergies fluvastatin nitroglycerin rosuvastatin simvastatin ezetimibe Lipitor lisinopril Allergy Notes Electronic Signature(s) Signed: 10/23/2023 12:01:12 PM By: Midge Aver MSN RN CNS WTA Entered By: Midge Aver on 10/22/2023 11:06:47 -------------------------------------------------------------------------------- Arrival Information Details Patient Name: Date of Service: [name], [name] 10/22/2023 2:00 PM Medical Record Number: [id number] Patient Account Number: [id number] Date of Birth/Sex: Treating RN: [birth year] (72 y.o. [name] Primary Care Mikeyla Music: [name] Other Clinician: Referring Melvine Julin: Treating Darien Kading/Extender: [name] in Treatment: 0 Visit Information Patient Arrived: Ambulatory Arrival Time: 13:57 ABDIMALIK, [place] ([id number]) Accompanied By: wife Transfer Assistance: None Patient Identification Verified: Yes Secondary Verification Process Completed: Yes Patient Requires Transmission-Based Precautions: No Patient Has Alerts: Yes Patient Alerts: Patient on Blood T Type 1 Diabetes Plavix History Since Last Visit Added or deleted any medications: No Any new allergies or adverse reactions: No Has Dressing in Place as Prescribed: Yes [id number] Page 2 of 14 Has Dressing in Place as Prescribed: Yes Pain Present Now: No hinner Electronic Signature(s) Signed: 10/23/2023  12:01:12 PM By: Midge Aver MSN RN CNS WTA Entered By: Midge Aver on 10/22/2023 11:02:32 -------------------------------------------------------------------------------- Clinic Level of Care Assessment Details Patient Name: Date of Service: [name], [name] 10/22/2023 2:00 PM Medical Record Number: [id number] Patient Account Number: [id number] Date of Birth/Sex: Treating RN: Dec 07, 1950 (72 y.o. [name] Primary Care Killian Ress: [name] Other Clinician: Referring Tawnee Clegg: Treating Peyton Rossner/Extender: [name] in Treatment: 0 Clinic Level of Care Assessment Items TOOL 1 Quantity Score X- 1 0 Use when EandM and Procedure is performed on INITIAL visit ASSESSMENTS - Nursing Assessment / Reassessment X- 1 20 General Physical Exam (combine w/ comprehensive assessment (listed just below) when performed on new pt. evals) X- 1 25 Comprehensive Assessment (HX, ROS, Risk Assessments, Wounds Hx, etc.) ASSESSMENTS - Wound and Skin Assessment / Reassessment []  - 0 Dermatologic / Skin Assessment (not related to wound area) ASSESSMENTS - Ostomy and/or Continence Assessment and Care []  - 0 Incontinence Assessment and Management []  - 0 Ostomy Care Assessment and Management (repouching, etc.) PROCESS - Coordination of Care []  - 0 Simple Patient / Family Education for ongoing care X- 1 20 Complex (extensive) Patient / Family Education for ongoing care X- 1 10 Staff obtains Chiropractor, Records, T Results / Process Orders est []  - 0 Staff telephones HHA, Nursing Homes / Clarify orders / etc []  - 0 Routine Transfer to another Facility (non-emergent condition) []  - 0 Routine Hospital Admission (non-emergent condition) X- 1 15 New Admissions / Manufacturing engineer / Ordering NPWT Apligraf, etc. , []  - 0 Emergency Hospital Admission (emergent condition) PROCESS - Special Needs []  - 0 Pediatric / Minor Patient Management []  - 0 Isolation  Patient Management []  - 0 Hearing / Language / Visual special needs BURDELL, POMPER ([id number]) [phone].pdf Page 3 of 14 []  - 0 Assessment of Community assistance (transportation, D/C planning, etc.) []  - 0 Additional assistance / Altered mentation []  - 0 Support Surface(s) Assessment (bed, cushion, seat, etc.) INTERVENTIONS - Miscellaneous []  - 0 External ear  exam []  - 0 Patient Transfer (multiple staff / Nurse, adult / Similar devices) []  - 0 Simple Staple / Suture removal (25 or less) []  - 0 Complex Staple / Suture removal (26 or more) []  - 0 Hypo/Hyperglycemic Management (do not check if billed separately) []  - 0 Ankle / Brachial Index (ABI) - do not check if billed separately Has the patient been seen at the hospital within the last three years: Yes Total Score: 90 Level Of Care: New/Established - Level 3 Electronic Signature(s) Signed: 10/23/2023 12:01:12 PM By: Midge Aver MSN RN CNS WTA Entered By: Midge Aver on 10/22/2023 14:01:00 -------------------------------------------------------------------------------- Encounter Discharge Information Details Patient Name: Date of Service: [name], [name] 10/22/2023 2:00 PM Medical Record Number: [id number] Patient Account Number: [id number] Date of Birth/Sex: Treating RN: [birth year] (72 y.o. [name] Primary Care Clova Morlock: [name] Other Clinician: Referring Aamani Moose: Treating Lliam Hoh/Extender: [name] in Treatment: 0 Encounter Discharge Information Items Post Procedure Vitals Discharge Condition: Stable Temperature (F): 98.3 Ambulatory Status: Cane Pulse (bpm): 75 Discharge Destination: Home Respiratory Rate (breaths/min): 18 Transportation: Private Auto Blood Pressure (mmHg): 124/53 Accompanied By: wife Schedule Follow-up Appointment: Yes Clinical Summary of Care: Electronic Signature(s) Signed: 10/22/2023 5:09:53 PM By: Midge Aver MSN  RN CNS WTA Entered By: Midge Aver on 10/22/2023 14:09:53 Mcclish, [name] ([id number]) [id number] Page 4 of 14 -------------------------------------------------------------------------------- Lower Extremity Assessment Details Patient Name: Date of Service: [name], [name] 10/22/2023 2:00 PM Medical Record Number: [id number] Patient Account Number: [id number] Date of Birth/Sex: Treating RN: 02-Oct-1951 (72 y.o. [name] Primary Care Tinesha Siegrist: [name] Other Clinician: Referring Omarion Minnehan: Treating Maeson Lourenco/Extender: [name] in Treatment: 0 Notes Sending to [facility] for an updated ABI/TBI Electronic Signature(s) Signed: 10/23/2023 12:01:12 PM By: Midge Aver MSN RN CNS WTA Entered By: Midge Aver on 10/22/2023 11:57:37 -------------------------------------------------------------------------------- Multi Wound Chart Details Patient Name: Date of Service: [name], [name] 10/22/2023 2:00 PM Medical Record Number: [id number] Patient Account Number: [id number] Date of Birth/Sex: Treating RN: [birth year] (72 y.o. [name] Primary Care Sulema Braid: [name] Other Clinician: Referring Jesseca Marsch: Treating Chaeli Judy/Extender: [name] in Treatment: 0 Vital Signs Height(in): 70 Pulse(bpm): 75 Weight(lbs): 290 Blood Pressure(mmHg): 124/53 Body Mass Index(BMI): 41.6 Temperature(F): 98.3 Respiratory Rate(breaths/min): 18 [5:Photos:] Right T Third oe Left T Fourth oe Left T Great oe Wound Location: Gradually Appeared Gradually Appeared Gradually Appeared Wounding Event: Diabetic Wound/Ulcer of the Lower Diabetic Wound/Ulcer of the Lower Diabetic Wound/Ulcer of the Lower Primary Etiology: Extremity Extremity Extremity Glaucoma, Sleep Apnea, Coronary Glaucoma, Sleep Apnea, Coronary Glaucoma, Sleep Apnea, Coronary Comorbid History: Artery Disease, Hypertension, Artery  Disease, Hypertension, Artery Disease, Hypertension, Myocardial Infarction, Type I Diabetes, Myocardial Infarction, Type I Diabetes, Myocardial Infarction, Type I Diabetes, End Stage Renal Disease, Neuropathy End Stage Renal Disease, Neuropathy End Stage Renal Disease, Neuropathy 09/30/2023 09/30/2023 09/30/2023 Date Acquired: 0 0 0 Weeks of Treatment: Open Open Open Wound Status: No No No Wound Recurrence: 0.8x0.7x0.1 0.5x0.5x0.1 1x1.3x0.1 Measurements L x W x D (cm) 0.44 0.196 1.021 A (cm) : rea 0.044 0.02 0.102 Volume (cm) : Grade 2 Grade 2 Grade 2 Classification: Medium Medium Medium Exudate A mount: Serosanguineous Serosanguineous Serosanguineous Exudate Type: red, brown red, brown red, brown Exudate Color: [name] ([id number]) [phone].pdf Page 5 of 14 Medium (34-66%) Medium (34-66%) None Present (0%) Granulation Amount: Pink Red N/A Granulation Quality: Medium (34-66%) Medium (34-66%) Medium (34-66%) Necrotic Amount:  Adherent Slough N/A Eschar Necrotic Tissue: Fat Layer (Subcutaneous Tissue): Yes Fascia: No N/A Exposed Structures: Fascia: No Fat Layer (Subcutaneous Tissue): No Tendon: No Tendon: No Muscle: No Muscle: No Joint: No Joint: No Bone: No Bone: No None N/A None Epithelialization: Debridement - Excisional Debridement - Excisional Debridement - Excisional Debridement: Pre-procedure Verification/Time Out 14:53 14:53 14:53 Taken: Lidocaine 4% T opical Solution Lidocaine 4% Topical Solution Lidocaine 4% Topical Solution Pain Control: Other, Callus, Subcutaneous, Slough Callus, Subcutaneous, Slough Necrotic/Eschar, Callus, Tissue Debrided: Subcutaneous, Slough Skin/Subcutaneous Tissue Skin/Subcutaneous Tissue Skin/Subcutaneous Tissue Level: 0.44 0.2 1.02 Debridement A (sq cm): rea Curette Curette Curette Instrument: Minimum Minimum Minimum Bleeding: Silver Nitrate Pressure Pressure Hemostasis Achieved: 0  0 0 Procedural Pain: 0 0 0 Post Procedural Pain: Procedure was tolerated well Procedure was tolerated well Procedure was tolerated well Debridement Treatment Response: 0.8x0.7x0.1 0.5x0.5x0.1 1x1.3x0.1 Post Debridement Measurements L x W x D (cm) 0.044 0.02 0.102 Post Debridement Volume: (cm) Debridement Debridement Debridement Procedures Performed: Wound Number: 8 N/A N/A Photos: N/A N/A Left, Lateral Foot N/A N/A Wound Location: Gradually Appeared N/A N/A Wounding Event: Diabetic Wound/Ulcer of the Lower N/A N/A Primary Etiology: Extremity Glaucoma, Sleep Apnea, Coronary N/A N/A Comorbid History: Artery Disease, Hypertension, Myocardial Infarction, Type I Diabetes, End Stage Renal Disease, Neuropathy 09/30/2023 N/A N/A Date Acquired: 0 N/A N/A Weeks of Treatment: Open N/A N/A Wound Status: No N/A N/A Wound Recurrence: 0.8x0.6x0.2 N/A N/A Measurements L x W x D (cm) 0.377 N/A N/A A (cm) : rea 0.075 N/A N/A Volume (cm) : Grade 2 N/A N/A Classification: Medium N/A N/A Exudate A mount: Serosanguineous N/A N/A Exudate Type: red, brown N/A N/A Exudate Color: None Present (0%) N/A N/A Granulation A mount: N/A N/A N/A Granulation Quality: Medium (34-66%) N/A N/A Necrotic A mount: Adherent Slough N/A N/A Necrotic Tissue: Fat Layer (Subcutaneous Tissue): Yes N/A N/A Exposed Structures: Fascia: No Tendon: No Muscle: No Joint: No Bone: No None N/A N/A Epithelialization: Debridement - Excisional N/A N/A Debridement: Pre-procedure Verification/Time Out 14:53 N/A N/A Taken: Lidocaine 4% Topical Solution N/A N/A Pain Control: Callus, Subcutaneous, Slough N/A N/A Tissue Debrided: Skin/Subcutaneous Tissue N/A N/A Level: 0.13 N/A N/A Debridement A (sq cm): rea Curette N/A N/A Instrument: Minimum N/A N/A Bleeding: Pressure N/A N/A Hemostasis A chieved: 0 N/A N/A Procedural Pain: 0 N/A N/A Post Procedural Pain: Procedure was tolerated well N/A  N/A Debridement Treatment Response: 0.8x0.2x0.2 N/A N/A Post Debridement Measurements L x W x D (cm) DEVANE, GRANNAN ([id number]) [phone].pdf Page 6 of 14 0.025 N/A N/A Post Debridement Volume: (cm) Debridement N/A N/A Procedures Performed: Treatment Notes Electronic Signature(s) Signed: 10/22/2023 4:58:36 PM By: Midge Aver MSN RN CNS WTA Entered By: Midge Aver on 10/22/2023 13:58:36 -------------------------------------------------------------------------------- Multi-Disciplinary Care Plan Details Patient Name: Date of Service: [name], [name] 10/22/2023 2:00 PM Medical Record Number: [id number] Patient Account Number: [id number] Date of Birth/Sex: Treating RN: 28-Jun-1951 (72 y.o. [name] Primary Care Shakera Ebrahimi: [name] Other Clinician: Referring Elfa Wooton: Treating Jariana Shumard/Extender: [name] in Treatment: 0 Active Inactive Necrotic Tissue Nursing Diagnoses: Impaired tissue integrity related to necrotic/devitalized tissue Knowledge deficit related to management of necrotic/devitalized tissue Goals: Necrotic/devitalized tissue will be minimized in the wound bed Date Initiated: 10/22/2023 Target Resolution Date: 11/22/2023 Goal Status: Active Patient/caregiver will verbalize understanding of reason and process for debridement of necrotic tissue Date Initiated: 10/22/2023 Target Resolution Date: 11/22/2023 Goal Status: Active Interventions: Assess patient pain level pre-, during and post procedure and prior  to discharge Provide education on necrotic tissue and debridement process Treatment Activities: Apply topical anesthetic as ordered : 10/22/2023 Excisional debridement : 10/22/2023 Notes: Wound/Skin Impairment Nursing Diagnoses: Impaired tissue integrity Knowledge deficit related to ulceration/compromised skin integrity Goals: Ulcer/skin breakdown will have a volume reduction of 30% by week  4 Date Initiated: 10/22/2023 Target Resolution Date: 11/22/2023 Goal Status: Active Ulcer/skin breakdown will have a volume reduction of 50% by week 8 Date Initiated: 10/22/2023 Target Resolution Date: 12/23/2023 Goal Status: Active Ulcer/skin breakdown will have a volume reduction of 80% by week 12 Date Initiated: 10/22/2023 Target Resolution Date: 01/20/2024 [name] ([id number]) [phone].pdf Page 7 of 14 Goal Status: Active Ulcer/skin breakdown will heal within 14 weeks Date Initiated: 10/22/2023 Target Resolution Date: 02/03/2024 Goal Status: Active Interventions: Assess patient/caregiver ability to obtain necessary supplies Assess patient/caregiver ability to perform ulcer/skin care regimen upon admission and as needed Assess ulceration(s) every visit Provide education on ulcer and skin care Treatment Activities: Referred to DME Hindy Perrault for dressing supplies : 10/22/2023 Skin care regimen initiated : 10/22/2023 Notes: Electronic Signature(s) Signed: 10/22/2023 5:06:00 PM By: Midge Aver MSN RN CNS WTA Previous Signature: 10/22/2023 5:05:09 PM Version By: Midge Aver MSN RN CNS WTA Entered By: Midge Aver on 10/22/2023 14:05:59 -------------------------------------------------------------------------------- Pain Assessment Details Patient Name: Date of Service: [name] MS, [name] 10/22/2023 2:00 PM Medical Record Number: [id number] Patient Account Number: [id number] Date of Birth/Sex: Treating RN: 1951/06/12 (72 y.o. [name] Primary Care Juanangel Soderholm: [name] Other Clinician: Referring Holly Iannaccone: Treating Peyten Punches/Extender: [name] in Treatment: 0 Active Problems Location of Pain Severity and Description of Pain Patient Has Paino No Site Locations Rate the pain. Current Pain Level: 0 Pain Management and Medication Current Pain Management: Electronic Signature(s) Signed: 10/23/2023 12:01:12 PM  By: Midge Aver MSN RN CNS [address] [name] ([id number]) [id number] Page 8 of 14 Entered By: Midge Aver on 10/22/2023 11:05:52 -------------------------------------------------------------------------------- Patient/Caregiver Education Details Patient Name: Date of Service: [name], [name] 12/19/2024andnbsp2:00 PM Medical Record Number: [id number] Patient Account Number: [id number] Date of Birth/Gender: Treating RN: 1951/10/16 (72 y.o. [name] Primary Care Physician: [name] Other Clinician: Referring Physician: Treating Physician/Extender: [name] in Treatment: 0 Education Assessment Education Provided To: Patient Education Topics Provided Wound Debridement: Handouts: Wound Debridement Methods: Explain/Verbal Responses: State content correctly Wound/Skin Impairment: Handouts: Caring for Your Ulcer Methods: Explain/Verbal Responses: State content correctly Electronic Signature(s) Signed: 10/23/2023 12:01:12 PM By: Midge Aver MSN RN CNS WTA Entered By: Midge Aver on 10/22/2023 14:07:09 -------------------------------------------------------------------------------- Wound Assessment Details Patient Name: Date of Service: [name], [name] 10/22/2023 2:00 PM Medical Record Number: [id number] Patient Account Number: [id number] Date of Birth/Sex: Treating RN: January 06, 1951 (72 y.o. [name] Primary Care Temitope Griffing: [name] Other Clinician: Referring Eshawn Coor: Treating Jahniyah Revere/Extender: [name] in Treatment: 0 Wound Status Wound Number: 5 Primary Diabetic Wound/Ulcer of the Lower Extremity Etiology: Wound Location: Right T Third oe Wound Open Wounding Event: Gradually Appeared Status: Date Acquired: 09/30/2023 [name] ([id number]) [phone].pdf Page 9 of 14 Date Acquired: 09/30/2023 Comorbid Glaucoma, Sleep Apnea,  Coronary Artery Disease, Hypertension, Weeks Of Treatment: 0 History: Myocardial Infarction, Type I Diabetes, End Stage Renal Disease, Clustered Wound: No Neuropathy Photos Wound Measurements Length: (cm) 0.8 Width: (cm) 0.7 Depth: (cm) 0.1 Area: (cm) 0.44 Volume: (cm) 0.044 % Reduction in Area: % Reduction in Volume: Epithelialization: None Wound Description Classification: Grade 2 Exudate  Amount: Medium Exudate Type: Serosanguineous Exudate Color: red, brown Wound Bed Granulation Amount: Medium (34-66%) Exposed Structure Granulation Quality: Pink Fascia Exposed: No Necrotic Amount: Medium (34-66%) Fat Layer (Subcutaneous Tissue) Exposed: Yes Necrotic Quality: Adherent Slough Tendon Exposed: No Muscle Exposed: No Joint Exposed: No Bone Exposed: No Treatment Notes Wound #5 (Toe Third) Wound Laterality: Right Cleanser Byram Ancillary Kit - 15 Day Supply Discharge Instruction: Use supplies as instructed; Kit contains: (15) Saline Bullets; (15) 3x3 Gauze; 15 pr Gloves Soap and Water Discharge Instruction: Gently cleanse wound with antibacterial soap, rinse and pat dry prior to dressing wounds Vashe 5.8 (oz) Discharge Instruction: Use vashe 5.8 (oz) as directed Peri-Wound Care Topical Gentamicin Discharge Instruction: Apply as directed by Kirsty Monjaraz. Primary Dressing Hydrofera Blue Ready Transfer Foam, 2.5x2.5 (in/in) Discharge Instruction: Apply Hydrofera Blue Ready to wound bed as directed Secondary Dressing Gauze Discharge Instruction: As directed: dry, moistened with saline or moistened with Dakins Solution Secured With Medipore T - 35M Medipore H Soft Cloth Surgical T ape ape, 2x2 (in/yd) Kerlix Roll Sterile or Non-Sterile 6-ply 4.5x4 (yd/yd) Discharge Instruction: Apply Kerlix as directed Compression Wrap Compression Stockings [name] ([id number]) [phone].pdf Page 10 of 14 Add-Ons Electronic Signature(s) Signed: 10/23/2023  12:01:12 PM By: Midge Aver MSN RN CNS WTA Entered By: Midge Aver on 10/22/2023 11:23:16 -------------------------------------------------------------------------------- Wound Assessment Details Patient Name: Date of Service: [name], [name] 10/22/2023 2:00 PM Medical Record Number: [id number] Patient Account Number: [id number] Date of Birth/Sex: Treating RN: Jan 06, 1951 (72 y.o. [name] Primary Care Andrick Rust: [name] Other Clinician: Referring Lanny Donoso: Treating Letroy Vazguez/Extender: [name] in Treatment: 0 Wound Status Wound Number: 6 Primary Diabetic Wound/Ulcer of the Lower Extremity Etiology: Wound Location: Left T Fourth oe Wound Open Wounding Event: Gradually Appeared Status: Date Acquired: 09/30/2023 Comorbid Glaucoma, Sleep Apnea, Coronary Artery Disease, Hypertension, Weeks Of Treatment: 0 History: Myocardial Infarction, Type I Diabetes, End Stage Renal Disease, Clustered Wound: No Neuropathy Photos Wound Measurements Length: (cm) Width: (cm) Depth: (cm) Area: (cm) Volume: (cm) 0.5 % Reduction in Area: 0.5 % Reduction in Volume: 0.1 0.196 0.02 Wound Description Classification: Grade 2 Exudate Amount: Medium Exudate Type: Serosanguineous Exudate Color: red, brown Foul Odor After Cleansing: No Slough/Fibrino No Wound Bed Granulation Amount: Medium (34-66%) Exposed Structure Granulation Quality: Red Fascia Exposed: No Necrotic Amount: Medium (34-66%) Fat Layer (Subcutaneous Tissue) Exposed: No Tendon Exposed: No Muscle Exposed: No Joint Exposed: No Bone Exposed: No [name] ([id number]) [phone].pdf Page 11 of 14 Treatment Notes Wound #6 (Toe Fourth) Wound Laterality: Left Cleanser Byram Ancillary Kit - 15 Day Supply Discharge Instruction: Use supplies as instructed; Kit contains: (15) Saline Bullets; (15) 3x3 Gauze; 15 pr Gloves Soap and Water Discharge Instruction:  Gently cleanse wound with antibacterial soap, rinse and pat dry prior to dressing wounds Vashe 5.8 (oz) Discharge Instruction: Use vashe 5.8 (oz) as directed Peri-Wound Care Topical Gentamicin Discharge Instruction: Apply as directed by Faysal Fenoglio. Primary Dressing Hydrofera Blue Ready Transfer Foam, 2.5x2.5 (in/in) Discharge Instruction: Apply Hydrofera Blue Ready to wound bed as directed Secondary Dressing Gauze Discharge Instruction: As directed: dry, moistened with saline or moistened with Dakins Solution Secured With Medipore T - 35M Medipore H Soft Cloth Surgical T ape ape, 2x2 (in/yd) Kerlix Roll Sterile or Non-Sterile 6-ply 4.5x4 (yd/yd) Discharge Instruction: Apply Kerlix as directed Compression Wrap Compression Stockings Add-Ons Electronic Signature(s) Signed: 10/23/2023 12:01:12 PM By: Midge Aver MSN RN CNS WTA Entered By: Midge Aver on 10/22/2023 11:25:37 -------------------------------------------------------------------------------- Wound Assessment  Details Patient Name: Date of Service: [name], [name] 10/22/2023 2:00 PM Medical Record Number: [id number] Patient Account Number: [id number] Date of Birth/Sex: Treating RN: 1950-11-27 (72 y.o. [name] Primary Care Ranyia Witting: [name] Other Clinician: Referring Malarie Tappen: Treating Ankita Newcomer/Extender: [name] in Treatment: 0 Wound Status Wound Number: 7 Primary Diabetic Wound/Ulcer of the Lower Extremity Etiology: Wound Location: Left T Great oe Wound Open Wounding Event: Gradually Appeared Status: Date Acquired: 09/30/2023 Comorbid Glaucoma, Sleep Apnea, Coronary Artery Disease, Hypertension, Weeks Of Treatment: 0 History: Myocardial Infarction, Type I Diabetes, End Stage Renal Disease, Clustered Wound: No Neuropathy Photos WYLAND, GANTT ([id number]) [id number] Page 12 of 14 Wound Measurements Length: (cm) 1 Width: (cm) 1.3 Depth:  (cm) 0.1 Area: (cm) 1.021 Volume: (cm) 0.102 % Reduction in Area: % Reduction in Volume: Epithelialization: None Wound Description Classification: Grade 2 Exudate Amount: Medium Exudate Type: Serosanguineous Exudate Color: red, brown Foul Odor After Cleansing: No Slough/Fibrino No Wound Bed Granulation Amount: None Present (0%) Necrotic Amount: Medium (34-66%) Necrotic Quality: Eschar Treatment Notes Wound #7 (Toe Great) Wound Laterality: Left Cleanser Byram Ancillary Kit - 15 Day Supply Discharge Instruction: Use supplies as instructed; Kit contains: (15) Saline Bullets; (15) 3x3 Gauze; 15 pr Gloves Soap and Water Discharge Instruction: Gently cleanse wound with antibacterial soap, rinse and pat dry prior to dressing wounds Vashe 5.8 (oz) Discharge Instruction: Use vashe 5.8 (oz) as directed Peri-Wound Care Topical Gentamicin Discharge Instruction: Apply as directed by Moranda Billiot. Primary Dressing Hydrofera Blue Ready Transfer Foam, 2.5x2.5 (in/in) Discharge Instruction: Apply Hydrofera Blue Ready to wound bed as directed Secondary Dressing Gauze Discharge Instruction: As directed: dry, moistened with saline or moistened with Dakins Solution Secured With Medipore T - 62M Medipore H Soft Cloth Surgical T ape ape, 2x2 (in/yd) Kerlix Roll Sterile or Non-Sterile 6-ply 4.5x4 (yd/yd) Discharge Instruction: Apply Kerlix as directed Compression Wrap Compression Stockings Add-Ons Electronic Signature(s) Signed: 10/23/2023 12:01:12 PM By: Midge Aver MSN RN CNS WTA Entered By: Midge Aver on 10/22/2023 11:27:30 Ludemann, [name] ([id number]) [id number] Page 13 of 14 -------------------------------------------------------------------------------- Wound Assessment Details Patient Name: Date of Service: [name], [name] 10/22/2023 2:00 PM Medical Record Number: [id number] Patient Account Number: [id number] Date of Birth/Sex: Treating RN: [birth year]  (72 y.o. [name] Primary Care Keir Viernes: [name] Other Clinician: Referring Jorian Willhoite: Treating Amadu Schlageter/Extender: [name] in Treatment: 0 Wound Status Wound Number: 8 Primary Diabetic Wound/Ulcer of the Lower Extremity Etiology: Wound Location: Left, Lateral Foot Wound Open Wounding Event: Gradually Appeared Status: Date Acquired: 09/30/2023 Comorbid Glaucoma, Sleep Apnea, Coronary Artery Disease, Hypertension, Weeks Of Treatment: 0 History: Myocardial Infarction, Type I Diabetes, End Stage Renal Disease, Clustered Wound: No Neuropathy Photos Wound Measurements Length: (cm) 0.8 Width: (cm) 0.6 Depth: (cm) 0.2 Area: (cm) 0.377 Volume: (cm) 0.075 % Reduction in Area: % Reduction in Volume: Epithelialization: None Wound Description Classification: Grade 2 Exudate Amount: Medium Exudate Type: Serosanguineous Exudate Color: red, brown Wound Bed Granulation Amount: None Present (0%) Exposed Structure Necrotic Amount: Medium (34-66%) Fascia Exposed: No Necrotic Quality: Adherent Slough Fat Layer (Subcutaneous Tissue) Exposed: Yes Tendon Exposed: No Muscle Exposed: No Joint Exposed: No Bone Exposed: No Treatment Notes Wound #8 (Foot) Wound Laterality: Left, Lateral Cleanser Byram Ancillary Kit - 15 Day Supply Discharge Instruction: Use supplies as instructed; Kit contains: (15) Saline Bullets; (15) 3x3 Gauze; 15 pr Gloves Soap and [address] VARAD, TITMAN ([id number]) [phone].pdf Page 14 of 14 Discharge Instruction: Gently  cleanse wound with antibacterial soap, rinse and pat dry prior to dressing wounds Vashe 5.8 (oz) Discharge Instruction: Use vashe 5.8 (oz) as directed Peri-Wound Care Topical Gentamicin Discharge Instruction: Apply as directed by Asencion Guisinger. Primary Dressing Hydrofera Blue Ready Transfer Foam, 2.5x2.5 (in/in) Discharge Instruction: Apply Hydrofera Blue Ready to wound bed as  directed Secondary Dressing Gauze Discharge Instruction: As directed: dry, moistened with saline or moistened with Dakins Solution Secured With Medipore T - 69M Medipore H Soft Cloth Surgical T ape ape, 2x2 (in/yd) Kerlix Roll Sterile or Non-Sterile 6-ply 4.5x4 (yd/yd) Discharge Instruction: Apply Kerlix as directed Compression Wrap Compression Stockings Add-Ons Electronic Signature(s) Signed: 10/23/2023 12:01:12 PM By: Midge Aver MSN RN CNS WTA Entered By: Midge Aver on 10/22/2023 11:29:23 -------------------------------------------------------------------------------- Vitals Details Patient Name: Date of Service: [name], [name] 10/22/2023 2:00 PM Medical Record Number: [id number] Patient Account Number: [id number] Date of Birth/Sex: Treating RN: December 04, 1950 (71 y.o. [name] Primary Care Clare Fennimore: [name] Other Clinician: Referring Joanny Dupree: Treating Thang Flett/Extender: [name] in Treatment: 0 Vital Signs Time Taken: 14:05 Temperature (F): 98.3 Height (in): 70 Pulse (bpm): 75 Source: Stated Respiratory Rate (breaths/min): 18 Weight (lbs): 290 Blood Pressure (mmHg): 124/53 Source: Stated Reference Range: 80 - 120 mg / dl Body Mass Index (BMI): 41.6 Electronic Signature(s) Signed: 10/23/2023 12:01:12 PM By: Midge Aver MSN RN CNS WTA Entered By: Midge Aver on 10/22/2023 11:06:28

## 2023-10-24 NOTE — Progress Notes (Signed)
Lawrence [name], Lawrence [name] ([id number]) [id number] Page 1 of 5 Visit Report for 10/22/2023 Abuse Risk Screen Details Patient Name: Date of Service: [name], [name] 10/22/2023 2:00 PM Medical Record Number: [id number] Patient Account Number: [id number] Date of Birth/Sex: Treating RN: 04/26/1951 (72 y.o. Lawrence [name] Primary Care Kvon Mcilhenny: [name] Other Clinician: Referring Barth Trella: Treating Arby Dahir/Extender: [name] in Treatment: 0 Abuse Risk Screen Items Answer Electronic Signature(s) Signed: 10/23/2023 12:01:12 PM By: Midge Aver MSN RN CNS WTA Entered By: Midge Aver on 10/22/2023 11:09:21 -------------------------------------------------------------------------------- Activities of Daily Living Details Patient Name: Date of Service: [name], [name] 10/22/2023 2:00 PM Medical Record Number: [id number] Patient Account Number: [id number] Date of Birth/Sex: Treating RN: February 14, 1951 (72 y.o. Lawrence [name] Primary Care Danilo Cappiello: [name] Other Clinician: Referring Perian Tedder: Treating Glendoris Nodarse/Extender: [name] in Treatment: 0 Activities of Daily Living Items Answer Activities of Daily Living (Please select one for each item) Drive Automobile Completely Able T Medications ake Completely Able Use T elephone Completely Able Care for Appearance Completely Able Use T oilet Completely Able Bath / Shower Completely Able Dress Self Completely Able Feed Self Completely Able Walk Completely Able Get In / Out Bed Completely Able Housework Completely Able Prepare Meals Completely Able Handle Money Completely Able Shop for Self Completely Lawrence [name], Lawrence [name] ([id number]) [phone] [id number] Page 2 of 5 Electronic Signature(s) Signed: 10/23/2023 12:01:12 PM By: Midge Aver MSN RN CNS WTA Entered By: Midge Aver on 10/22/2023  11:09:46 -------------------------------------------------------------------------------- Education Screening Details Patient Name: Date of Service: [name], [name] 10/22/2023 2:00 PM Medical Record Number: [id number] Patient Account Number: [id number] Date of Birth/Sex: Treating RN: May 06, 1951 (72 y.o. Lawrence [name] Primary Care Inocente Krach: [name] Other Clinician: Referring Italo Banton: Treating Miu Chiong/Extender: [name] in Treatment: 0 Learning Preferences/Education Level/Primary Language Learning Preference: Explanation, Demonstration Preferred Language: English Cognitive Barrier Language Barrier: No Translator Needed: No Memory Deficit: No Emotional Barrier: No Cultural/Religious Beliefs Affecting Medical Care: No Physical Barrier Impaired Vision: No Impaired Hearing: No Decreased Hand dexterity: No Knowledge/Comprehension Knowledge Level: High Comprehension Level: High Ability to understand written instructions: High Ability to understand verbal instructions: High Motivation Anxiety Level: Calm Cooperation: Cooperative Education Importance: Acknowledges Need Interest in Health Problems: Asks Questions Perception: Coherent Willingness to Engage in Self-Management High Activities: Readiness to Engage in Self-Management High Activities: Electronic Signature(s) Signed: 10/23/2023 12:01:12 PM By: Midge Aver MSN RN CNS WTA Entered By: Midge Aver on 10/22/2023 11:10:19 Lawrence [name] ([id number]) [phone] [id number] Page 3 of 5 -------------------------------------------------------------------------------- Fall Risk Assessment Details Patient Name: Date of Service: [name], [name] 10/22/2023 2:00 PM Medical Record Number: [id number] Patient Account Number: [id number] Date of Birth/Sex: Treating RN: May 02, 1951 (72 y.o. Lawrence [name] Primary Care Joelle Flessner: [name] Other  Clinician: Referring Jonah Gingras: Treating Irene Collings/Extender: [name] in Treatment: 0 Fall Risk Assessment Items Have you had 2 or more falls in the last 12 monthso 0 No Have you had any fall that resulted in injury in the last 12 monthso 0 No FALLS RISK SCREEN History of falling - immediate or within 3 months 0 No Secondary diagnosis (Do you have 2 or more medical diagnoseso) 0 No Ambulatory aid None/bed rest/wheelchair/nurse 0 No Crutches/cane/walker 15 Yes Furniture 0 No Intravenous therapy Access/Saline/Heparin Lock 0 No Gait/Transferring Normal/ bed rest/ wheelchair 0 No Weak (short steps with or without shuffle, stooped but able  to lift head while walking, may seek 0 No support from furniture) Impaired (short steps with shuffle, may have difficulty arising from chair, head down, impaired 0 No balance) Mental Status Oriented to own ability 0 Yes Electronic Signature(s) Signed: 10/23/2023 12:01:12 PM By: Midge Aver MSN RN CNS WTA Entered By: Midge Aver on 10/22/2023 11:11:07 -------------------------------------------------------------------------------- Foot Assessment Details Patient Name: Date of Service: [name], [name] 10/22/2023 2:00 PM Medical Record Number: [id number] Patient Account Number: [id number] Date of Birth/Sex: Treating RN: Mar 15, 1951 (72 y.o. Lawrence [name] Primary Care Haydn Hutsell: [name] Other Clinician: Referring Zyona Pettaway: Treating Kaylon Hitz/Extender: [name] in Treatment: 0 Foot Assessment Items Site Locations Lawrence [name], Lawrence [name] ([id number]) [id number] Page 4 of 5 + = Sensation present, - = Sensation absent, C = Callus, U = Ulcer R = Redness, W = Warmth, M = Maceration, PU = Pre-ulcerative lesion F = Fissure, S = Swelling, D = Dryness Assessment Right: Left: Other Deformity: No No Prior Foot Ulcer: Yes No Prior Amputation: Yes No Charcot Joint:  No No Ambulatory Status: Ambulatory Without Help Gait: Steady Electronic Signature(s) Signed: 10/23/2023 12:01:12 PM By: Midge Aver MSN RN CNS WTA Entered By: Midge Aver on 10/22/2023 11:12:02 -------------------------------------------------------------------------------- Nutrition Risk Screening Details Patient Name: Date of Service: [name], [name] 10/22/2023 2:00 PM Medical Record Number: [id number] Patient Account Number: [id number] Date of Birth/Sex: Treating RN: Mar 18, 1951 (72 y.o. Lawrence [name] Primary Care Leather Estis: [name] Other Clinician: Referring Yumi Insalaco: Treating Starlene Consuegra/Extender: [name] in Treatment: 0 Height (in): 70 Weight (lbs): 290 Body Mass Index (BMI): 41.6 Nutrition Risk Screening Items Score Screening NUTRITION RISK SCREEN: I have an illness or condition that made me change the kind and/or amount of food I eat 0 No I eat fewer than two meals per day 0 No I eat few fruits and vegetables, or milk products 0 No I have three or more drinks of beer, liquor or wine almost every day 0 No I have tooth or mouth problems that make it hard for me to eat 0 No I don't always have enough money to buy the food I need 0 No Lawrence [name], Lawrence [name] ([id number]) [phone] [id number] Page 5 of 5 I eat alone most of the time 0 No I take three or more different prescribed or over-the-counter drugs a day 1 Yes Without wanting to, I have lost or gained 10 pounds in the last six months 0 No I am not always physically able to shop, cook and/or feed myself 0 No Nutrition Protocols Good Risk Protocol 0 No interventions needed Moderate Risk Protocol High Risk Proctocol Risk Level: Good Risk Score: 1 Electronic Signature(s) Signed: 10/23/2023 12:01:12 PM By: Midge Aver MSN RN CNS WTA Entered By: Midge Aver on 10/22/2023 11:11:23

## 2023-10-24 NOTE — Progress Notes (Signed)
Lawrence [name] ([id number]) [phone].pdf Page 1 of 15 Visit Report for 10/22/2023 Chief Complaint Document Details Patient Name: Date of Service: [name], [name] 10/22/2023 2:00 PM Medical Record Number: [id number] Patient Account Number: [id number] Date of Birth/Sex: Treating RN: [birth year] (72 y.o. [name] Primary Care Provider: [name] Other Clinician: Referring Provider: Treating Provider/Extender: [name] in Treatment: 0 Information Obtained from: Patient Chief Complaint Bilateral foot ulcers Electronic Signature(s) Signed: 10/22/2023 2:42:43 PM By: [name] Entered By: [name] on 10/22/2023 11:42:42 -------------------------------------------------------------------------------- Debridement Details Patient Name: Date of Service: [name], [name] 10/22/2023 2:00 PM Medical Record Number: [id number] Patient Account Number: [id number] Date of Birth/Sex: Treating RN: [birth year] (72 y.o. [name] Primary Care Provider: [name] Other Clinician: Referring Provider: Treating Provider/Extender: [name] in Treatment: 0 Debridement Performed for Assessment: Wound #5 Right T Third oe Performed By: Physician [name], PA-C The following information was scribed by: Midge Aver The information was scribed for: [name] Debridement Type: Debridement Severity of Tissue Pre Debridement: Fat layer exposed Level of Consciousness (Pre-procedure): Awake and Alert Pre-procedure Verification/Time Out Yes - 14:53 Taken: Start Time: 14:53 Pain Control: Lidocaine 4% T opical Solution Percent of Wound Bed Debrided: 100% T Area Debrided (cm): otal 0.44 Tissue and other material debrided: Viable, Non-Viable, Callus, Slough, Subcutaneous, Slough, Other: toe nail Level: Skin/Subcutaneous Tissue Debridement Description: Excisional Instrument:  Curette Bleeding: Minimum Lawrence [name] ([id number]) [phone].pdf Page 2 of 15 Hemostasis Achieved: Silver Nitrate Procedural Pain: 0 Post Procedural Pain: 0 Response to Treatment: Procedure was tolerated well Level of Consciousness (Post- Awake and Alert procedure): Post Debridement Measurements of Total Wound Length: (cm) 0.8 Width: (cm) 0.7 Depth: (cm) 0.1 Volume: (cm) 0.044 Character of Wound/Ulcer Post Debridement: Stable Severity of Tissue Post Debridement: Fat layer exposed Post Procedure Diagnosis Same as Pre-procedure Electronic Signature(s) Signed: 10/23/2023 12:01:12 PM By: Midge Aver MSN RN CNS WTA Signed: 10/23/2023 12:16:54 PM By: [name] Entered By: Midge Aver on 10/22/2023 11:56:20 -------------------------------------------------------------------------------- Debridement Details Patient Name: Date of Service: [name], [name] 10/22/2023 2:00 PM Medical Record Number: [id number] Patient Account Number: [id number] Date of Birth/Sex: Treating RN: July 02, 1951 (72 y.o. [name] Primary Care Provider: [name] Other Clinician: Referring Provider: Treating Provider/Extender: [name] in Treatment: 0 Debridement Performed for Assessment: Wound #8 Left,Lateral Foot Performed By: Physician [name], PA-C Debridement Type: Debridement Severity of Tissue Pre Debridement: Fat layer exposed Level of Consciousness (Pre-procedure): Awake and Alert Pre-procedure Verification/Time Out Yes - 14:53 Taken: Start Time: 14:53 Pain Control: Lidocaine 4% T opical Solution Percent of Wound Bed Debrided: 100% T Area Debrided (cm): otal 0.13 Tissue and other material debrided: Viable, Non-Viable, Callus, Slough, Subcutaneous, Slough Level: Skin/Subcutaneous Tissue Debridement Description: Excisional Instrument: Curette Bleeding: Minimum Hemostasis Achieved: Pressure Procedural Pain:  0 Post Procedural Pain: 0 Response to Treatment: Procedure was tolerated well Level of Consciousness (Post- Awake and Alert procedure): Post Debridement Measurements of Total Wound Length: (cm) 0.8 Width: (cm) 0.2 Depth: (cm) 0.2 Volume: (cm) 0.025 Character of Wound/Ulcer Post Debridement: Stable Severity of Tissue Post Debridement: Fat layer exposed Lawrence [name] ([id number]) [phone].pdf Page 3 of 15 Post Procedure Diagnosis Same as Pre-procedure Electronic Signature(s) Signed: 10/23/2023 12:01:12 PM By: Midge Aver MSN RN CNS WTA Signed: 10/23/2023 12:16:54 PM By: [name] Entered By: Midge Aver on 10/22/2023 12:01:59 -------------------------------------------------------------------------------- Debridement Details Patient Name:  Date of Service: [name], [name] 10/22/2023 2:00 PM Medical Record Number: [id number] Patient Account Number: [id number] Date of Birth/Sex: Treating RN: 09-27-1951 (72 y.o. [name] Primary Care Provider: [name] Other Clinician: Referring Provider: Treating Provider/Extender: [name] in Treatment: 0 Debridement Performed for Assessment: Wound #6 Left T Fourth oe Performed By: Physician [name], PA-C The following information was scribed by: Midge Aver The information was scribed for: [name] Debridement Type: Debridement Severity of Tissue Pre Debridement: Fat layer exposed Level of Consciousness (Pre-procedure): Awake and Alert Pre-procedure Verification/Time Out Yes - 14:53 Taken: Start Time: 14:53 Pain Control: Lidocaine 4% T opical Solution Percent of Wound Bed Debrided: 100% T Area Debrided (cm): otal 0.2 Tissue and other material debrided: Viable, Non-Viable, Callus, Slough, Subcutaneous, Slough Level: Skin/Subcutaneous Tissue Debridement Description: Excisional Instrument: Curette Bleeding: Minimum Hemostasis Achieved:  Pressure Procedural Pain: 0 Post Procedural Pain: 0 Response to Treatment: Procedure was tolerated well Level of Consciousness (Post- Awake and Alert procedure): Post Debridement Measurements of Total Wound Length: (cm) 0.5 Width: (cm) 0.5 Depth: (cm) 0.1 Volume: (cm) 0.02 Character of Wound/Ulcer Post Debridement: Stable Severity of Tissue Post Debridement: Fat layer exposed Post Procedure Diagnosis Same as Pre-procedure Electronic Signature(s) Signed: 10/23/2023 12:01:12 PM By: Midge Aver MSN RN CNS WTA Signed: 10/23/2023 12:16:54 PM By: [name] Entered By: Midge Aver on 10/22/2023 12:02:59 Lawrence [name], Lawrence [name] ([id number]) [id number] Page 4 of 15 -------------------------------------------------------------------------------- Debridement Details Patient Name: Date of Service: [name], [name] 10/22/2023 2:00 PM Medical Record Number: [id number] Patient Account Number: [id number] Date of Birth/Sex: Treating RN: Nov 01, 1951 (72 y.o. [name] Primary Care Provider: [name] Other Clinician: Referring Provider: Treating Provider/Extender: [name] in Treatment: 0 Debridement Performed for Assessment: Wound #7 Left T Great oe Performed By: Physician [name], PA-C Debridement Type: Debridement Severity of Tissue Pre Debridement: Fat layer exposed Level of Consciousness (Pre-procedure): Awake and Alert Pre-procedure Verification/Time Out Yes - 14:53 Taken: Start Time: 14:53 Pain Control: Lidocaine 4% T opical Solution Percent of Wound Bed Debrided: 100% T Area Debrided (cm): otal 1.02 Tissue and other material debrided: Viable, Non-Viable, Callus, Eschar, Slough, Subcutaneous, Slough Level: Skin/Subcutaneous Tissue Debridement Description: Excisional Instrument: Curette Bleeding: Minimum Hemostasis Achieved: Pressure Procedural Pain: 0 Post Procedural Pain: 0 Response to Treatment:  Procedure was tolerated well Level of Consciousness (Post- Awake and Alert procedure): Post Debridement Measurements of Total Wound Length: (cm) 1 Width: (cm) 1.3 Depth: (cm) 0.1 Volume: (cm) 0.102 Character of Wound/Ulcer Post Debridement: Stable Severity of Tissue Post Debridement: Fat layer exposed Post Procedure Diagnosis Same as Pre-procedure Electronic Signature(s) Signed: 10/23/2023 12:01:12 PM By: Midge Aver MSN RN CNS WTA Signed: 10/23/2023 12:16:54 PM By: [name] Entered By: Midge Aver on 10/22/2023 12:14:42 -------------------------------------------------------------------------------- HPI Details Patient Name: Date of Service: [name], [name] 10/22/2023 2:00 PM Lawrence [name] ([id number]) [id number] Page 5 of 15 Medical Record Number: [id number] Patient Account Number: [id number] Date of Birth/Sex: Treating RN: 06-Nov-1950 (72 y.o. 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 At that time it was stated that he had essentially unchanged study from his prior Mar 19, 2022 study. At that point we were able to get him healed. HPI Description: Readmission: 10-22-2023 upon evaluation today patient appears for reevaluation here in the clinic concerning issues he is having wounds over the bilateral feet. This includes wounds on the right third toe, left fourth toe, left great toe, and left lateral foot. The  lateral foot location is a surgery spot where Dr. Logan Bores removed a portion of bone to try to take pressure off of the area unfortunately there is a little bit of breakdown here this does not appear to be too deep however. Patient's past medical history really has not shifted since I last saw him earlier in the year and he does have a history of sleep apnea, chronic kidney disease stage IIIa, peripheral vascular disease, hypertension, coronary artery disease, and diabetes mellitus type 1. Electronic Signature(s) Signed: 10/22/2023 5:42:15 PM By: [name] Entered By: [name] on 10/22/2023 14:42:15 -------------------------------------------------------------------------------- Physical Exam Details Patient Name: Date of Service: [name], [name] 10/22/2023 2:00 PM Medical Record Number: [id number] Patient Account Number: [id number] Date of Birth/Sex: Treating RN: Jun 09, 1951 (72 y.o. [name] Primary Care Provider: [name] Other Clinician: Referring Provider: Treating Provider/Extender: [name] in Treatment: 0 Constitutional sitting or standing blood pressure is within target range for patient.. pulse regular and within target range for patient.[name] respirations regular, non-labored and within target range for patient.[name] temperature within target range for patient.. Obese and well-hydrated in no acute distress. Eyes conjunctiva clear no eyelid edema noted. pupils equal round and reactive to light and accommodation. Ears, Nose, Mouth, and Throat no gross abnormality of ear auricles or external auditory canals. normal hearing noted during conversation. mucus membranes moist. Respiratory normal breathing without difficulty. Cardiovascular 1+ dorsalis pedis/posterior tibialis pulses. no clubbing, cyanosis, significant edema, <3 sec cap refill. Musculoskeletal normal gait and posture. no significant deformity or arthritic changes, no loss or  range of motion, no clubbing. Psychiatric this patient is able to make decisions and demonstrates good insight into disease process. Alert and Oriented x 3. pleasant and cooperative. Notes Upon inspection patient's wounds actually did require fairly aggressive debridement to all wound locations and he tolerated this today without complication. Fortunately I do not see any signs of worsening overall and I believe that the patient is making excellent headway after debridement towards getting things in a better spot here. With that being said I discussed with him it still can take a bit of time before I think he will be free and clear and again we will get have to closely monitor the situation as far as an infection standpoint is concerned. He has previously done hyperbarics for prior wounds that we were able to get Lawrence [name], Lawrence [name] ([id number]) [phone].pdf Page 6 of 15 completely healed. Electronic Signature(s) Signed: 10/22/2023 5:43:00 PM By: [name] Entered By: [name] on 10/22/2023 14:43:00 -------------------------------------------------------------------------------- Physician Orders Details Patient Name: Date of Service: [name], [name] 10/22/2023 2:00 PM Medical Record Number: [id number] Patient Account Number: [id number] Date of Birth/Sex: Treating RN: Mar 19, 1951 (72 y.o. [name] Primary Care Provider: [name] Other Clinician: Referring Provider: Treating Provider/Extender: [name] in Treatment: 0 The following information was scribed by: Midge Aver The information was scribed  for: [name] Verbal / Phone Orders: No Diagnosis Coding ICD-10 Coding Code Description E10.621 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 L97.522 Non-pressure chronic ulcer of other part of left foot with fat layer exposed L97.512 Non-pressure chronic ulcer of other part of right foot with fat layer  exposed I25.10 Atherosclerotic heart disease of native coronary artery without angina pectoris I10 Essential (primary) hypertension I73.89 Other specified peripheral vascular diseases N18.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, stage 3a G47.30 Sleep apnea, unspecified Follow-up Appointments Return Appointment in 1 week. Bathing/ Shower/ Hygiene Clean wound with Normal Saline or wound cleanser. Wash wounds with antibacterial soap and water. Anesthetic (Use 'Patient Medications' Section for Anesthetic Order Entry) Lidocaine applied to wound bed Wound Treatment Wound #5 - T Third oe Wound Laterality: Right Cleanser: Byram Ancillary Kit - 15 Day Supply (DME) (Generic) 3 x Per Week/30 Days Discharge Instructions: Use supplies as instructed; Kit contains: (15) Saline Bullets; (15) 3x3 Gauze; 15 pr Gloves Cleanser: Soap and Water 3 x Per Week/30 Days Discharge Instructions: Gently cleanse wound with antibacterial soap, rinse and pat dry prior to dressing wounds Cleanser: Vashe 5.8 (oz) 3 x Per Week/30 Days Discharge Instructions: Use vashe 5.8 (oz) as directed Topical: Gentamicin 3 x Per Week/30 Days Discharge Instructions: Apply as directed by provider. Prim Dressing: Hydrofera Blue Ready Transfer Foam, 2.5x2.5 (in/in) (DME) (Dispense As Written) 3 x Per Week/30 Days ary Discharge Instructions: Apply Hydrofera Blue Ready to wound bed as directed Secondary Dressing: Gauze (DME) (Generic) 3 x Per Week/30 Days Discharge Instructions: As directed: dry, moistened with saline or moistened with Dakins Solution Lawrence [name] ([id number]) [phone].pdf Page 7 of 15 Secured With: Medipore T - 17M Medipore H Soft Cloth Surgical T ape ape, 2x2 (in/yd) (DME) (Generic) 3 x Per Week/30 Days Secured With: [employer] Sterile or Non-Sterile 6-ply 4.5x4 (yd/yd) (DME) (Generic) 3 x Per Week/30 Days Discharge Instructions: Apply Kerlix as directed Wound #6 - T Fourth oe Wound Laterality:  Left Cleanser: Byram Ancillary Kit - 15 Day Supply (DME) (Generic) 3 x Per Week/30 Days Discharge Instructions: Use supplies as instructed; Kit contains: (15) Saline Bullets; (15) 3x3 Gauze; 15 pr Gloves Cleanser: Soap and Water 3 x Per Week/30 Days Discharge Instructions: Gently cleanse wound with antibacterial soap, rinse and pat dry prior to dressing wounds Cleanser: Vashe 5.8 (oz) 3 x Per Week/30 Days Discharge Instructions: Use vashe 5.8 (oz) as directed Topical: Gentamicin 3 x Per Week/30 Days Discharge Instructions: Apply as directed by provider. Prim Dressing: Hydrofera Blue Ready Transfer Foam, 2.5x2.5 (in/in) (DME) (Dispense As Written) 3 x Per Week/30 Days ary Discharge Instructions: Apply Hydrofera Blue Ready to wound bed as directed Secondary Dressing: Gauze (DME) (Generic) 3 x Per Week/30 Days Discharge Instructions: As directed: dry, moistened with saline or moistened with Dakins Solution Secured With: Medipore T - 17M Medipore H Soft Cloth Surgical T ape ape, 2x2 (in/yd) (DME) (Generic) 3 x Per Week/30 Days Secured With: [employer] Sterile or Non-Sterile 6-ply 4.5x4 (yd/yd) (DME) (Generic) 3 x Per Week/30 Days Discharge Instructions: Apply Kerlix as directed Wound #7 - T Great oe Wound Laterality: Left Cleanser: Byram Ancillary Kit - 15 Day Supply (DME) (Generic) 3 x Per Week/30 Days Discharge Instructions: Use supplies as instructed; Kit contains: (15) Saline Bullets; (15) 3x3 Gauze; 15 pr Gloves Cleanser: Soap and Water 3 x Per Week/30 Days Discharge Instructions: Gently cleanse wound with antibacterial soap, rinse and pat dry prior to dressing wounds Cleanser: Vashe 5.8 (oz) 3 x Per Week/30 Days  Discharge Instructions: Use vashe 5.8 (oz) as directed Topical: Gentamicin 3 x Per Week/30 Days Discharge Instructions: Apply as directed by provider. Prim Dressing: Hydrofera Blue Ready Transfer Foam, 2.5x2.5 (in/in) (DME) (Dispense As Written) 3 x Per Week/30  Days ary Discharge Instructions: Apply Hydrofera Blue Ready to wound bed as directed Secondary Dressing: Gauze (DME) (Generic) 3 x Per Week/30 Days Discharge Instructions: As directed: dry, moistened with saline or moistened with Dakins Solution Secured With: Medipore T - 58M Medipore H Soft Cloth Surgical T ape ape, 2x2 (in/yd) (DME) (Generic) 3 x Per Week/30 Days Secured With: [employer] Sterile or Non-Sterile 6-ply 4.5x4 (yd/yd) (DME) (Generic) 3 x Per Week/30 Days Discharge Instructions: Apply Kerlix as directed Wound #8 - Foot Wound Laterality: Left, Lateral Cleanser: Byram Ancillary Kit - 15 Day Supply (DME) (Generic) 3 x Per Week/30 Days Discharge Instructions: Use supplies as instructed; Kit contains: (15) Saline Bullets; (15) 3x3 Gauze; 15 pr Gloves Cleanser: Soap and Water 3 x Per Week/30 Days Discharge Instructions: Gently cleanse wound with antibacterial soap, rinse and pat dry prior to dressing wounds Cleanser: Vashe 5.8 (oz) 3 x Per Week/30 Days Discharge Instructions: Use vashe 5.8 (oz) as directed Topical: Gentamicin 3 x Per Week/30 Days Discharge Instructions: Apply as directed by provider. Prim Dressing: Hydrofera Blue Ready Transfer Foam, 2.5x2.5 (in/in) (DME) (Dispense As Written) 3 x Per Week/30 Days ary Discharge Instructions: Apply Hydrofera Blue Ready to wound bed as directed Secondary Dressing: Gauze (DME) (Generic) 3 x Per Week/30 Days Discharge Instructions: As directed: dry, moistened with saline or moistened with Dakins Solution Secured With: Medipore T - 58M Medipore H Soft Cloth Surgical T ape ape, 2x2 (in/yd) (DME) (Generic) 3 x Per Week/30 Days Secured With: [employer] Sterile or Non-Sterile 6-ply 4.5x4 (yd/yd) (DME) (Generic) 3 x Per Week/30 Days Lawrence [name] ([id number]) [id number].pdf Page 8 of 15 Discharge Instructions: Apply Kerlix as directed Services and Therapies Ankle Brachial Index (ABI) Patient  Medications llergies: fluvastatin, nitroglycerin, rosuvastatin, simvastatin, ezetimibe, Lipitor, lisinopril A Notifications Medication Indication Start End 10/22/2023 Cipro DOSE 1 - oral 500 mg tablet - 1 tablet oral twice a day x 14 days Electronic Signature(s) Signed: 10/23/2023 12:01:12 PM By: Midge Aver MSN RN CNS WTA Signed: 10/23/2023 12:16:54 PM By: [name] Previous Signature: 10/22/2023 6:04:53 PM Version By: [name] Previous Signature: 10/22/2023 5:00:32 PM Version By: Midge Aver MSN RN CNS WTA Entered By: Midge Aver on 10/23/2023 09:00:35 -------------------------------------------------------------------------------- Problem List Details Patient Name: Date of Service: [name], [name] 10/22/2023 2:00 PM Medical Record Number: [id number] Patient Account Number: [id number] Date of Birth/Sex: Treating RN: September 29, 1951 (72 y.o. [name] Primary Care Provider: [name] Other Clinician: Referring Provider: Treating Provider/Extender: [name] in Treatment: 0 Active Problems ICD-10 Encounter Code Description Active Date MDM Diagnosis E10.621 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 10/22/2023 No Yes L97.522 Non-pressure chronic ulcer of other part of left foot with fat layer exposed 10/22/2023 No Yes L97.512 Non-pressure chronic ulcer of other part of right foot with fat layer exposed 10/22/2023 No Yes I25.10 Atherosclerotic heart disease of native coronary artery without angina pectoris 10/22/2023 No Yes I10 Essential (primary) hypertension 10/22/2023 No Yes I73.89 Other specified peripheral vascular diseases 10/22/2023 No Yes Lawrence [name], Lawrence [name] ([id number]) [id number].pdf Page 9 of 15 N18.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, stage 3a 10/22/2023 No Yes G47.30 Sleep apnea, unspecified 10/22/2023 No Yes Inactive Problems Resolved Problems Electronic Signature(s) Signed: 10/22/2023 2:42:25 PM By:  Larina Bras,  Awilda Covin PA-C Entered By: [name] on 10/22/2023 11:42:25 -------------------------------------------------------------------------------- Progress Note Details Patient Name: Date of Service: [name], [name] 10/22/2023 2:00 PM Medical Record Number: [id number] Patient Account Number: [id number] Date of Birth/Sex: Treating RN: 1950/12/01 (72 y.o. 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 At that time it was stated that he had essentially unchanged study from his prior Mar 19, 2022 study. At that point we were able to get him healed. Readmission: 10-22-2023 upon evaluation today patient appears for reevaluation here in the clinic concerning issues he is having wounds over the bilateral feet. This includes wounds on the right third toe, left fourth toe, left great toe, and left lateral foot. The lateral foot location is a surgery spot where Dr. Logan Bores removed a portion of bone to try to take pressure off of the area unfortunately there is a little bit of breakdown here this does not appear to be too deep however. Patient's past medical history really has not shifted since I last saw him earlier in the year and he does have a history of sleep apnea, chronic kidney disease stage IIIa, peripheral vascular disease, hypertension, coronary artery disease, and diabetes mellitus type  1. Patient History Allergies fluvastatin, nitroglycerin, rosuvastatin, simvastatin, ezetimibe, Lipitor, lisinopril Social History Former smoker, Marital Status - Married, Alcohol Use - Never, Drug Use - No History, Caffeine Use - Rarely. Medical History Eyes Patient has history of Glaucoma Respiratory Patient has history of Sleep Apnea Cardiovascular Patient has history of Coronary Artery Disease, Hypertension, Myocardial Infarction Endocrine Patient has history of Type I Diabetes Denies history of Type II Diabetes Lawrence [name], Lawrence [name] ([id number]) [phone].pdf Page 10 of 15 Genitourinary Patient has history of End Stage Renal Disease Neurologic Patient has history of Neuropathy Patient is treated with Insulin. Blood sugar is not tested. Blood sugar results noted at the following times: Lunch - 81. Review of Systems (ROS) Ear/Nose/Mouth/Throat Denies complaints or symptoms of Difficult clearing ears, Sinusitis. Hematologic/Lymphatic Denies complaints or symptoms of Bleeding / Clotting Disorders, Human Immunodeficiency Virus. Respiratory Denies complaints or symptoms of Chronic or frequent coughs, Shortness of Breath. Immunological Denies complaints or symptoms of Hives, Itching. Integumentary (Skin) Complains or has symptoms of Wounds. Musculoskeletal Denies complaints or symptoms of Muscle Pain, Muscle Weakness. Psychiatric Denies complaints or symptoms of Anxiety, Claustrophobia. Objective Constitutional sitting or standing blood pressure is within target range for patient.. pulse regular and within target range for patient.[name] respirations regular, non-labored and within target range for patient.[name] temperature within target range for patient.. Obese and well-hydrated in no acute distress. Vitals Time Taken: 2:05 PM, Height: 70 in, Source: Stated, Weight: 290 lbs, Source: Stated, BMI: 41.6, Temperature: 98.3 F, Pulse: 75 bpm, Respiratory Rate: 18  breaths/min, Blood Pressure: 124/53 mmHg. Eyes conjunctiva clear no eyelid edema noted. pupils equal round and reactive to light and accommodation. Ears, Nose, Mouth, and Throat no gross abnormality of ear auricles or external auditory canals. normal hearing noted during conversation.  mucus membranes moist. Respiratory normal breathing without difficulty. Cardiovascular 1+ dorsalis pedis/posterior tibialis pulses. no clubbing, cyanosis, significant edema, Musculoskeletal normal gait and posture. no significant deformity or arthritic changes, no loss or range of motion, no clubbing. Psychiatric this patient is able to make decisions and demonstrates good insight into disease process. Alert and Oriented x 3. pleasant and cooperative. General Notes: Upon inspection patient's wounds actually did require fairly aggressive debridement to all wound locations and he tolerated this today without complication. Fortunately I do not see any signs of worsening overall and I believe that the patient is making excellent headway after debridement towards getting things in a better spot here. With that being said I discussed with him it still can take a bit of time before I think he will be free and clear and again we will get have to closely monitor the situation as far as an infection standpoint is concerned. He has previously done hyperbarics for prior wounds that we were able to get completely healed. Integumentary (Hair, Skin) Wound #5 status is Open. Original cause of wound was Gradually Appeared. The date acquired was: 09/30/2023. The wound is located on the Right T Third. oe The wound measures 0.8cm length x 0.7cm width x 0.1cm depth; 0.44cm^2 area and 0.044cm^3 volume. There is Fat Layer (Subcutaneous Tissue) exposed. There is a medium amount of serosanguineous drainage noted. There is medium (34-66%) pink granulation within the wound bed. There is a medium (34-66%) amount of necrotic tissue within  the wound bed including Adherent Slough. Wound #6 status is Open. Original cause of wound was Gradually Appeared. The date acquired was: 09/30/2023. The wound is located on the Left T Fourth. oe The wound measures 0.5cm length x 0.5cm width x 0.1cm depth; 0.196cm^2 area and 0.02cm^3 volume. There is a medium amount of serosanguineous drainage noted. There is medium (34-66%) red granulation within the wound bed. There is a medium (34-66%) amount of necrotic tissue within the wound bed. Wound #7 status is Open. Original cause of wound was Gradually Appeared. The date acquired was: 09/30/2023. The wound is located on the Left T Great. oe The wound measures 1cm length x 1.3cm width x 0.1cm depth; 1.021cm^2 area and 0.102cm^3 volume. There is a medium amount of serosanguineous drainage noted. There is no granulation within the wound bed. There is a medium (34-66%) amount of necrotic tissue within the wound bed including Eschar. Wound #8 status is Open. Original cause of wound was Gradually Appeared. The date acquired was: 09/30/2023. The wound is located on the Left,Lateral Foot. The wound measures 0.8cm length x 0.6cm width x 0.2cm depth; 0.377cm^2 area and 0.075cm^3 volume. There is Fat Layer (Subcutaneous Tissue) exposed. There is a medium amount of serosanguineous drainage noted. There is no granulation within the wound bed. There is a medium (34-66%) amount of necrotic tissue within the wound bed including Adherent Slough. 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 There  was a Excisional Skin/Subcutaneous Tissue Debridement with a total area of 0.44 sq cm performed by [name], PA-C. With the following instrument(s): Curette to remove Viable and Non-Viable tissue/material. Material removed includes Callus, Subcutaneous Tissue, Slough, and Other: toe nail after achieving pain control using Lidocaine 4% Topical Solution. No specimens were taken. A time out was conducted at 14:53, prior to the start of the procedure. A Minimum amount of bleeding was controlled with Silver Nitrate. The procedure was tolerated well with a pain level of 0 throughout and a pain level of 0 following the procedure. Post Debridement Measurements: 0.8cm length x 0.7cm width x 0.1cm depth; 0.044cm^3 volume. Character of Wound/Ulcer Post Debridement is stable. Severity of Tissue Post Debridement is: Fat layer exposed. Post procedure Diagnosis Wound #5: Same as Pre-Procedure Wound #6 Pre-procedure diagnosis of Wound #6 is a Diabetic Wound/Ulcer of the Lower Extremity located on the Left T Fourth .Severity of Tissue Pre Debridement is: oe Fat layer exposed. There was a Excisional Skin/Subcutaneous Tissue Debridement with a total area of 0.2 sq cm performed by [name], PA-C. With the following instrument(s): Curette to remove Viable and Non-Viable tissue/material. Material removed includes Callus, Subcutaneous Tissue, and Slough after achieving pain control using Lidocaine 4% Topical Solution. No specimens were taken. A time out was conducted at 14:53, prior to the start of the procedure. A Minimum amount of bleeding was controlled with Pressure. The procedure was tolerated well with a pain level of 0 throughout and a pain level of 0  following the procedure. Post Debridement Measurements: 0.5cm length x 0.5cm width x 0.1cm depth; 0.02cm^3 volume. Character of Wound/Ulcer Post Debridement is stable. Severity of Tissue Post Debridement is: Fat layer exposed. Post procedure Diagnosis Wound #6: Same as Pre-Procedure Wound #7 Pre-procedure diagnosis of Wound #7 is a Diabetic Wound/Ulcer of the Lower Extremity located on the Left T Great .Severity of Tissue Pre Debridement is: oe Fat layer exposed. There was a Excisional Skin/Subcutaneous Tissue Debridement with a total area of 1.02 sq cm performed by [name], PA-C. With the following instrument(s): Curette to remove Viable and Non-Viable tissue/material. Material removed includes Eschar, Callus, Subcutaneous Tissue, Slough, and Other: toe nail after achieving pain control using Lidocaine 4% Topical Solution. No specimens were taken. A time out was conducted at 14:53, prior to the start of the procedure. A Minimum amount of bleeding was controlled with Pressure. The procedure was tolerated well with a pain level of 0 throughout and a pain level of 0 following the procedure. Post Debridement Measurements: 1cm length x 1.3cm width x 0.1cm depth; 0.102cm^3 volume. Character of Wound/Ulcer Post Debridement is stable. Severity of Tissue Post Debridement is: Fat layer exposed. Post procedure Diagnosis Wound #7: Same as Pre-Procedure Wound #8 Pre-procedure diagnosis of Wound #8 is a Diabetic Wound/Ulcer of the Lower Extremity located on the Left,Lateral Foot .Severity of Tissue Pre Debridement is: Fat layer exposed. There was a Excisional Skin/Subcutaneous Tissue Debridement with a total area of 0.13 sq cm performed by [name], PA-C. With the following instrument(s): Curette to remove Viable and Non-Viable tissue/material. Material removed includes Callus, Subcutaneous Tissue, Slough, and Other: toe nail after achieving pain control using Lidocaine 4% Topical Solution. No specimens  were taken. A time out was conducted at 14:53, prior to the start of the procedure. A Minimum amount of bleeding was controlled with Pressure. The procedure was tolerated well with a pain level of 0 throughout and a pain level of 0 following the procedure. Post Debridement  Measurements: 0.8cm length x 0.2cm width x 0.2cm depth; 0.025cm^3 volume. Character of Wound/Ulcer Post Debridement is stable. Severity of Tissue Post Debridement is: Fat layer exposed. Post procedure Diagnosis Wound #8: Same as Pre-Procedure Plan Follow-up Appointments: Return Appointment in 1 week. Bathing/ Shower/ Hygiene: Clean wound with Normal Saline or wound cleanser. Wash wounds with antibacterial soap and water. Anesthetic (Use 'Patient Medications' Section for Anesthetic Order Entry): Lidocaine applied to wound bed The following medication(s) was prescribed: Cipro oral 500 mg tablet 1 1 tablet oral twice a day x 14 days starting 10/22/2023 WOUND #5: - T Third Wound Laterality: Right oe Cleanser: Byram Ancillary Kit - 15 Day Supply (DME) (Generic) 3 x Per Week/30 Days Discharge Instructions: Use supplies as instructed; Kit contains: (15) Saline Bullets; (15) 3x3 Gauze; 15 pr Gloves Cleanser: Soap and Water 3 x Per Week/30 Days Discharge Instructions: Gently cleanse wound with antibacterial soap, rinse and pat dry prior to dressing wounds Cleanser: Vashe 5.8 (oz) 3 x Per Week/30 Days Discharge Instructions: Use vashe 5.8 (oz) as directed Topical: Gentamicin 3 x Per Week/30 Days Discharge Instructions: Apply as directed by provider. Prim Dressing: Hydrofera Blue Ready Transfer Foam, 2.5x2.5 (in/in) (DME) (Dispense As Written) 3 x Per Week/30 Days ary Discharge Instructions: Apply Hydrofera Blue Ready to wound bed as directed Secondary Dressing: Gauze (DME) (Generic) 3 x Per Week/30 Days Discharge Instructions: As directed: dry, moistened with saline or moistened with Dakins Solution Secured With: Medipore T - 27M  Medipore H Soft Cloth Surgical T ape ape, 2x2 (in/yd) (DME) (Generic) 3 x Per Week/30 Days Lawrence [name] ([id number]) [phone].pdf Page 12 of 15 Secured With: [employer] or Non-Sterile 6-ply 4.5x4 (yd/yd) (DME) (Generic) 3 x Per Week/30 Days Discharge Instructions: Apply Kerlix as directed WOUND #6: - T Fourth Wound Laterality: Left oe Cleanser: Byram Ancillary Kit - 15 Day Supply (DME) (Generic) 3 x Per Week/30 Days Discharge Instructions: Use supplies as instructed; Kit contains: (15) Saline Bullets; (15) 3x3 Gauze; 15 pr Gloves Cleanser: Soap and Water 3 x Per Week/30 Days Discharge Instructions: Gently cleanse wound with antibacterial soap, rinse and pat dry prior to dressing wounds Cleanser: Vashe 5.8 (oz) 3 x Per Week/30 Days Discharge Instructions: Use vashe 5.8 (oz) as directed Topical: Gentamicin 3 x Per Week/30 Days Discharge Instructions: Apply as directed by provider. Prim Dressing: Hydrofera Blue Ready Transfer Foam, 2.5x2.5 (in/in) (DME) (Dispense As Written) 3 x Per Week/30 Days ary Discharge Instructions: Apply Hydrofera Blue Ready to wound bed as directed Secondary Dressing: Gauze (DME) (Generic) 3 x Per Week/30 Days Discharge Instructions: As directed: dry, moistened with saline or moistened with Dakins Solution Secured With: Medipore T - 27M Medipore H Soft Cloth Surgical T ape ape, 2x2 (in/yd) (DME) (Generic) 3 x Per Week/30 Days Secured With: [employer] Sterile or Non-Sterile 6-ply 4.5x4 (yd/yd) (DME) (Generic) 3 x Per Week/30 Days Discharge Instructions: Apply Kerlix as directed WOUND #7: - T Great Wound Laterality: Left oe Cleanser: Byram Ancillary Kit - 15 Day Supply (DME) (Generic) 3 x Per Week/30 Days Discharge Instructions: Use supplies as instructed; Kit contains: (15) Saline Bullets; (15) 3x3 Gauze; 15 pr Gloves Cleanser: Soap and Water 3 x Per Week/30 Days Discharge Instructions: Gently cleanse wound with antibacterial  soap, rinse and pat dry prior to dressing wounds Cleanser: Vashe 5.8 (oz) 3 x Per Week/30 Days Discharge Instructions: Use vashe 5.8 (oz) as directed Topical: Gentamicin 3 x Per Week/30 Days Discharge Instructions: Apply as directed by provider. Prim Dressing: Hydrofera  Blue Ready Transfer Foam, 2.5x2.5 (in/in) (DME) (Dispense As Written) 3 x Per Week/30 Days ary Discharge Instructions: Apply Hydrofera Blue Ready to wound bed as directed Secondary Dressing: Gauze (DME) (Generic) 3 x Per Week/30 Days Discharge Instructions: As directed: dry, moistened with saline or moistened with Dakins Solution Secured With: Medipore T - 92M Medipore H Soft Cloth Surgical T ape ape, 2x2 (in/yd) (DME) (Generic) 3 x Per Week/30 Days Secured With: [employer] Sterile or Non-Sterile 6-ply 4.5x4 (yd/yd) (DME) (Generic) 3 x Per Week/30 Days Discharge Instructions: Apply Kerlix as directed WOUND #8: - Foot Wound Laterality: Left, Lateral Cleanser: Byram Ancillary Kit - 15 Day Supply (DME) (Generic) 3 x Per Week/30 Days Discharge Instructions: Use supplies as instructed; Kit contains: (15) Saline Bullets; (15) 3x3 Gauze; 15 pr Gloves Cleanser: Soap and Water 3 x Per Week/30 Days Discharge Instructions: Gently cleanse wound with antibacterial soap, rinse and pat dry prior to dressing wounds Cleanser: Vashe 5.8 (oz) 3 x Per Week/30 Days Discharge Instructions: Use vashe 5.8 (oz) as directed Topical: Gentamicin 3 x Per Week/30 Days Discharge Instructions: Apply as directed by provider. Prim Dressing: Hydrofera Blue Ready Transfer Foam, 2.5x2.5 (in/in) (DME) (Dispense As Written) 3 x Per Week/30 Days ary Discharge Instructions: Apply Hydrofera Blue Ready to wound bed as directed Secondary Dressing: Gauze (DME) (Generic) 3 x Per Week/30 Days Discharge Instructions: As directed: dry, moistened with saline or moistened with Dakins Solution Secured With: Medipore T - 92M Medipore H Soft Cloth Surgical T ape ape, 2x2  (in/yd) (DME) (Generic) 3 x Per Week/30 Days Secured With: [employer] Sterile or Non-Sterile 6-ply 4.5x4 (yd/yd) (DME) (Generic) 3 x Per Week/30 Days Discharge Instructions: Apply Kerlix as directed 1. I would recommend that we have the patient go ahead and continue to monitor for any signs of worsening or infection at this time. I do believe that he is making pretty good headway here towards some closure but again there was a lot of necrotic debris we did have to perform debridement today to clear away some of this as well. 2. I am going to recommend initiation of therapy with a Hydrofera Blue. 3. Also going to recommend that he continue to monitor for any signs of infection if anything changes he knows contact the office and let me know. 4. Patient has doxycycline at home and I am going to extend the Cipro that he is taking currently as well and I will send this to the pharmacy he will be taking the 2 concurrently. I do not want him on the Bactrim any longer due to the kidney issues and the fact that he does have stage III kidney disease. We will see patient back for reevaluation in 1 week here in the clinic. If anything worsens or changes patient will contact our office for additional recommendations. Electronic Signature(s) Signed: 10/22/2023 6:05:32 PM By: [name] Previous Signature: 10/22/2023 5:43:41 PM Version By: [name] Entered By: [name] on 10/22/2023 15:05:32 ROS/PFSH Details -------------------------------------------------------------------------------- Lawrence [name] ([id number]) [id number] Page 13 of 15 Patient Name: Date of Service: [name], [name] 10/22/2023 2:00 PM Medical Record Number: [id number] Patient Account Number: [id number] Date of Birth/Sex: Treating RN: [birth year] (72 y.o. [name] Primary Care Provider: [name] Other Clinician: Referring Provider: Treating Provider/Extender: [name] in Treatment: 0 Ear/Nose/Mouth/Throat Complaints and Symptoms: Negative for: Difficult clearing ears; Sinusitis Hematologic/Lymphatic Complaints and Symptoms: Negative for: Bleeding / Clotting Disorders; Human Immunodeficiency Virus  Respiratory Complaints and Symptoms: Negative for: Chronic or frequent coughs; Shortness of Breath Medical History: Positive for: Sleep Apnea Immunological Complaints and Symptoms: Negative for: Hives; Itching Integumentary (Skin) Complaints and Symptoms: Positive for: Wounds Musculoskeletal Complaints and Symptoms: Negative for: Muscle Pain; Muscle Weakness Psychiatric Complaints and Symptoms: Negative for: Anxiety; Claustrophobia Eyes Medical History: Positive for: Glaucoma Cardiovascular Medical History: Positive for: Coronary Artery Disease; Hypertension; Myocardial Infarction Endocrine Medical History: Positive for: Type I Diabetes Negative for: Type II Diabetes Time with diabetes: 51 Treated with: Insulin Blood sugar tested every day: No Blood sugar testing results: Lunch: 81 Genitourinary Medical History: Positive for: End Stage Renal Disease Neurologic Medical History: Positive for: Neuropathy Oncologic YADRIAN, HIBMA ([id number]) [id number] Page 14 of 15 HBO Extended History Items Eyes: Glaucoma Immunizations Pneumococcal Vaccine: Received Pneumococcal Vaccination: No Implantable Devices None Family and Social History Former smoker; Marital Status - Married; Alcohol Use: Never; Drug Use: No History; Caffeine Use: Rarely Social Determinants of Health (SDOH) 1. In the past 2 months, did you or others you live with eat smaller meals or skip meals because you didn't have money for foodo : No 2. Are you homeless or worried that you might be in the futureo : No 3. Do you have trouble paying for your utilities (gas, electricity, phone)o : No 4. Do you have trouble  finding or paying for a rideo : No 5. Do you need daycare, or better daycare, for your kidso : No 6. Are you unemployed or without regular incomeo : No 7. Do you need help finding a better jobo : No 8. Do you need help getting more educationo : No 9. Are you concerned about someone in your home using drugs or alcoholo : No 10. Do you feel unsafe in your daily lifeo : No 11. Is anyone in your home threatening or abusing youo : No 12. Do you lack quality relationships that make you feel valued and supportedo : No 13. Do you need help getting cultural information in a language you understando : No 14. Do you need help getting internet accesso : No Advanced Directives and Instructions Advanced Directives: Yes Copy Provided: No Do not resuscitate: Yes Copy Provided: No Living Will: Yes Copy Provided: No Medical Power of Attorney: Yes Copy Provided: No Surrogate Decision Maker: No Electronic Signature(s) Signed: 10/23/2023 12:01:12 PM By: Midge Aver MSN RN CNS WTA Signed: 10/23/2023 12:16:54 PM By: [name] Entered By: Midge Aver on 10/22/2023 11:09:10 -------------------------------------------------------------------------------- SuperBill Details Patient Name: Date of Service: [name], [name] 10/22/2023 Medical Record Number: [id number] Patient Account Number: [id number] Date of Birth/Sex: Treating RN: July 14, 1951 (72 y.o. [name] Primary Care Provider: [name] Other Clinician: Referring Provider: Treating Provider/Extender: [name] in Treatment: 0 Diagnosis Coding ICD-10 Codes Code Description [phone] 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 Lawrence [name], Lawrence [name] ([id number]) [id number].pdf Page 15 of 15 [phone] Non-pressure chronic ulcer of other part of left foot with fat layer exposed L97.512 Non-pressure chronic ulcer of other part of right foot with fat layer exposed I25.10 Atherosclerotic  heart disease of native coronary artery without angina pectoris I10 Essential (primary) hypertension I73.89 Other specified peripheral vascular diseases N18.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, stage 3a G47.30 Sleep apnea, unspecified Facility Procedures : CPT4 Code: [id number] Description: 9213 - WOUND CARE VISIT-LEV 3 EST PT Modifier: Quantity: 1 : CPT4 Code: 16073710 1 Description: 1042 - DEB SUBQ TISSUE 20 SQ CM/< ICD-10 Diagnosis Description L97.522 Non-pressure chronic  ulcer of other part of left foot with fat layer exposed L97.512 Non-pressure chronic ulcer of other part of right foot with fat layer exposed Modifier: Quantity: 1 Physician Procedures : CPT4 Code Description Modifier [id number] 99214 - WC PHYS LEVEL 4 - EST PT 25 ICD-10 Diagnosis Description E10.621 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 L97.522 Non-pressure chronic ulcer of other part of left foot with fat layer exposed L97.512  Non-pressure chronic ulcer of other part of right foot with fat layer exposed I25.10 Atherosclerotic heart disease of native coronary artery without angina pectoris Quantity: 1 : 11042 11042 - WC PHYS SUBQ TISS 20 SQ CM ICD-10 Diagnosis Description 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 Quantity: 1 Electronic Signature(s) Signed: 10/22/2023 5:49:27 PM By: [name] Entered By: [name] on 10/22/2023 14:49:27

## 2023-10-27 ENCOUNTER — Other Ambulatory Visit (INDEPENDENT_AMBULATORY_CARE_PROVIDER_SITE_OTHER): Payer: Self-pay | Admitting: Physician Assistant

## 2023-10-27 DIAGNOSIS — L97509 Non-pressure chronic ulcer of other part of unspecified foot with unspecified severity: Secondary | ICD-10-CM

## 2023-10-27 DIAGNOSIS — L97522 Non-pressure chronic ulcer of other part of left foot with fat layer exposed: Secondary | ICD-10-CM

## 2023-10-27 DIAGNOSIS — L97512 Non-pressure chronic ulcer of other part of right foot with fat layer exposed: Secondary | ICD-10-CM

## 2023-11-02 ENCOUNTER — Ambulatory Visit (INDEPENDENT_AMBULATORY_CARE_PROVIDER_SITE_OTHER): Payer: Medicare Other

## 2023-11-02 ENCOUNTER — Ambulatory Visit (INDEPENDENT_AMBULATORY_CARE_PROVIDER_SITE_OTHER): Payer: Medicare Other | Admitting: Primary Care

## 2023-11-02 ENCOUNTER — Encounter: Payer: Self-pay | Admitting: Primary Care

## 2023-11-02 VITALS — BP 134/88 | HR 70 | Temp 98.7°F | Ht 70.5 in | Wt 296.0 lb

## 2023-11-02 DIAGNOSIS — E10621 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: Secondary | ICD-10-CM | POA: Diagnosis not present

## 2023-11-02 DIAGNOSIS — L97509 Non-pressure chronic ulcer of other part of unspecified foot with unspecified severity: Secondary | ICD-10-CM | POA: Diagnosis not present

## 2023-11-02 DIAGNOSIS — L97512 Non-pressure chronic ulcer of other part of right foot with fat layer exposed: Secondary | ICD-10-CM

## 2023-11-02 DIAGNOSIS — R600 Localized edema: Secondary | ICD-10-CM | POA: Diagnosis not present

## 2023-11-02 DIAGNOSIS — Z23 Encounter for immunization: Secondary | ICD-10-CM

## 2023-11-02 DIAGNOSIS — E785 Hyperlipidemia, unspecified: Secondary | ICD-10-CM | POA: Diagnosis not present

## 2023-11-02 DIAGNOSIS — E1065 Type 1 diabetes mellitus with hyperglycemia: Secondary | ICD-10-CM

## 2023-11-02 DIAGNOSIS — N18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, stage 3a: Secondary | ICD-10-CM

## 2023-11-02 DIAGNOSIS — I10 Essential (primary) hypertension: Secondary | ICD-10-CM

## 2023-11-02 DIAGNOSIS — G4733 Obstructive sleep apnea (adult) (pediatric): Secondary | ICD-10-CM

## 2023-11-02 DIAGNOSIS — L97522 Non-pressure chronic ulcer of other part of left foot with fat layer exposed: Secondary | ICD-10-CM

## 2023-11-02 DIAGNOSIS — I2089 Other forms of angina pectoris: Secondary | ICD-10-CM

## 2023-11-02 DIAGNOSIS — I6523 Occlusion and stenosis of bilateral carotid arteries: Secondary | ICD-10-CM | POA: Diagnosis not present

## 2023-11-02 DIAGNOSIS — K219 Gastro-esophageal reflux disease without esophagitis: Secondary | ICD-10-CM

## 2023-11-02 DIAGNOSIS — Z125 Encounter for screening for malignant neoplasm of prostate: Secondary | ICD-10-CM

## 2023-11-02 DIAGNOSIS — I25119 Atherosclerotic heart disease of native coronary artery with unspecified angina pectoris: Secondary | ICD-10-CM

## 2023-11-02 DIAGNOSIS — R519 Headache, unspecified: Secondary | ICD-10-CM

## 2023-11-02 NOTE — Assessment & Plan Note (Signed)
Continue CPAP nightly. °

## 2023-11-02 NOTE — Progress Notes (Signed)
Subjective:    Patient ID: Lawrence [name], male    DOB: Aug 26, 1951, 72 y.o.   MRN: [id number]  HPI  Lawrence [name] is a very pleasant 72 y.o. male with a history of CAD, bilateral carotid artery stenosis, OSA, GERD, type 1 diabetes, chronic left foot ulcer, morbid obesity, hyperlipidemia who presents today for follow-up of chronic conditions.  His wife joins [place] today.  1) CAD/Carotid Artery Stenosis/Hyperlipidemia/Hypertension: Currently managed on carvedilol 12.5 mg twice daily, aspirin 81 mg daily, clopidogrel 75 mg daily, furosemide 40 mg daily, isosorbide mononitrate 60 mg daily, potassium chloride 20 mEq daily, rosuvastatin 20 mg daily.  Following with cardiology through [facility], last office visit was in November 2024.  During this visit he mentioned continued intermittent chest pain symptoms.  The addition of Ranexa was discussed.  No changes were made to his regimen.  He continues to experience intermittent chest pain which has overall improved. Plan now is to monitor as Ranexa could interfere with Aimovig.  He underwent left cardiac catheterization in August 2024, severe left main and three-vessel coronary artery disease.  Recommendation was to continue medical therapy and aggressive risk factor modification.  He underwent bilateral carotid ultrasound in November 2024, no hemodynamically significant stenosis.  2) Type 1 Diabetes: Following with endocrinology.  Last A1c was 7.6 on 09/01/2023.  Currently managed on Semglee 20 units HS and Novolog sliding scale with meals. His last pneumonia vaccine was in 2019 with pneumovax 23. He has not had Prevnar 20.  Glucose level also have recently improved.  3) Migraines/Frequent Headaches: Following with neurology, currently managed on Aimovig 140 mg every month. Overall his headaches have improved slightly, but he continues to experience frequent headaches. He has been referred to a headache specialist but is not scheduled until June  2025.   4) Chronic Foot Ulcers: Following with podiatry and wound management. Last office visit was with wound team was on 10/22/23. Due for follow up with podiatry tomorrow and wound management later this week. He completed ABI testing this morning. Results are pending.  Review of Systems  Respiratory:  Negative for shortness of breath.   Cardiovascular:  Positive for chest pain.  Gastrointestinal:  Negative for constipation and diarrhea.  Skin:  Positive for wound.  Neurological:  Positive for headaches.  Psychiatric/Behavioral:  The patient is not nervous/anxious.          Past Medical History:  Diagnosis Date   Allergies    Anginal pain (HCC)    Bilateral carotid artery disease (HCC)    Breast pain, left 08/29/2022   CAD (coronary artery disease) 07/2007   a.) s/p NSTEMI 07/2007 - sig multi-vessel CAD not amenable to PCI; b.) LHC 06/11/2016: multi vessel CAD not amenable to PCI --> refer to CVTS; c.) s/p 4v CABG 06/27/2016; d.) LHC 06/09/2023: 75% mLM, 80% o-pLAD, 60% mLAD, 100% p-mLCx, 100% OM1, 95% pRCA, 100% mRCA, 50% m-dLAD. LIMA-LAD and SVG-OM1 patent. CTO SVG-RCA with L-R collaterals - med mgmt   Carpal tunnel syndrome on left    a.) s/p release 04/2016   CKD (chronic kidney disease), stage III (HCC)    COVID-19 12/09/2022   Diabetic peripheral neuropathy (HCC)    Diastolic dysfunction    a.) TTE 06/25/2016: EF 60-654%. no RWMAs, G1DD, mild AoV annular calc; b.) TTE 03/19/2017: EF 50-55%, no RWMAs, mild MR; c.) TTE 07/22/2019: EF 55-60%, midl LVH, no RWMAs, triv MR, mild TR   Dizziness 04/30/2021   Dyspnea    GERD (gastroesophageal  reflux disease)    Glaucoma    Headache    History of bilateral cataract extraction    HLD (hyperlipidemia)    HTN (hypertension)    Long term current use of clopidogrel    Long-term use of aspirin therapy    MRSA (methicillin resistant staph aureus) culture positive    2006 - 2008   Myocardial infarction (HCC) 04/04/2016   NSTEMI  (non-ST elevated myocardial infarction) (HCC) 07/27/2007   a.) LHC 07/30/2007: 30% mLAD, 99% D1, 50% pLCx, 40% mLCx, 70/75/85% dLCx, 50% OM1, 75/80/80 OM2, 50% pRCA, 60% mRCA, 85% dRCA --> not amenable to PCI   OSA on CPAP    Osteoarthritis    PAD (peripheral artery disease) (HCC)    a. 03/2022 Lower Ext Angio: No signif Ao-iliac dzs. R PT diff dzs and occluded above ankle. R Pedal arch intact w/ excellent antegrade flow provided by R AT-->no indication for revasc.   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   a.) LIMA-LAD, SVG-RCA, seq SVG-OM1-OM2   Skin cancer, basal cell    Skin ulcer of right great toe, unspecified ulcer stage (HCC)    T2DM (type 2 diabetes mellitus) (HCC)    Wears glasses    Wears partial dentures    top    Social History   Socioeconomic History   Marital status: Married    Spouse name: Dimichele,Lajuana   Number of children: Not on file   Years of education: Not on file   Highest education level: Not on file  Occupational History   Not on file  Tobacco Use   Smoking status: Former    Current packs/day: 0.00    Average packs/day: 1 pack/day for 16.7 years (16.7 ttl pk-yrs)    Types: Cigarettes    Start date: 11/03/1969    Quit date: 07/22/1986    Years since quitting: 37.3   Smokeless tobacco: Never  Vaping Use   Vaping status: Never Used  Substance and Sexual Activity   Alcohol use: Not Currently   Drug use: No   Sexual activity: Never  Other Topics Concern   Not on file  Social History Narrative   Married.  Lives at home with wife.    Social Drivers of Corporate investment banker Strain: Low Risk  (10/20/2023)   Overall Financial Resource Strain (CARDIA)    Difficulty of Paying Living Expenses: Not very hard  Food Insecurity: No Food Insecurity (10/20/2023)   Hunger Vital Sign    Worried About Running Out of Food in the Last Year: Never true    Ran Out of Food in the Last Year: Never true  Transportation Needs:  No Transportation Needs (10/20/2023)   PRAPARE - Administrator, Civil Service (Medical): No    Lack of Transportation (Non-Medical): No  Physical Activity: Inactive (10/20/2023)   Exercise Vital Sign    Days of Exercise per Week: 0 days    Minutes of Exercise per Session: 0 min  Stress: No Stress Concern Present (10/20/2023)   [employer] of Occupational Health - Occupational Stress Questionnaire    Feeling of Stress : Only a little  Social Connections: Moderately Integrated (10/20/2023)   Social Connection and Isolation Panel [NHANES]    Frequency of Communication with Friends and Family: More than three times a week    Frequency of Social Gatherings with Friends and Family: Twice a week    Attends Religious Services: 1 to 4 times  per year    Active Member of Clubs or Organizations: No    Attends Banker Meetings: Never    Marital Status: Married  Catering manager Violence: Not At Risk (10/20/2023)   Humiliation, Afraid, Rape, and Kick questionnaire    Fear of Current or Ex-Partner: No    Emotionally Abused: No    Physically Abused: No    Sexually Abused: No    Past Surgical History:  Procedure Laterality Date   ABDOMINAL AORTOGRAM W/LOWER EXTREMITY N/A 03/26/2022   Procedure: ABDOMINAL AORTOGRAM W/LOWER EXTREMITY;  Surgeon: Iran Ouch, MD;  Location: [facility];  Service: Cardiovascular;  Laterality: N/A;   AMPUTATION TOE Bilateral 06/26/2023   Procedure: AMPUTATION TOE MPJ OF THE JOINT,PARTIAL TOE AMPUTUATION;  Surgeon: Felecia Shelling, DPM;  Location: [facility];  Service: Orthopedics/Podiatry;  Laterality: Bilateral;   APPENDECTOMY     CARDIAC CATHETERIZATION N/A 06/11/2016   Procedure: Left Heart Cath and Coronary Angiography;  Surgeon: Lamar Blinks, MD;  Location: [facility];  Service: Cardiovascular;  Laterality: N/A;   CARPAL TUNNEL RELEASE Left 04/24/2016   Procedure: CARPAL TUNNEL RELEASE;  Surgeon: Kennedy Bucker,  MD;  Location: [facility];  Service: Orthopedics;  Laterality: Left;   CATARACT EXTRACTION W/ INTRAOCULAR LENS  IMPLANT, BILATERAL Bilateral    CATARACT EXTRACTION, BILATERAL     R eye 07/16/12, L eye 08/13/12 - with lens implant in both eyes   CORONARY ARTERY BYPASS GRAFT N/A 06/27/2016   Procedure: CORONARY ARTERY BYPASS GRAFTING times four using left internal mammary artery and right leg saphenous vein;  Surgeon: Kerin Perna, MD;  Location: [facility] OR;  Service: Open Heart Surgery;  Laterality: N/A;   FOREARM FRACTURE SURGERY Left 1961   KNEE ARTHROPLASTY Left 02/11/2016   Procedure: COMPUTER ASSISTED TOTAL KNEE ARTHROPLASTY;  Surgeon: Donato Heinz, MD;  Location: [facility];  Service: Orthopedics;  Laterality: Left;   KNEE ARTHROSCOPY Right 2000   LEFT HEART CATH AND CORONARY ANGIOGRAPHY Left 07/30/2007   Procedure: LEFT HEART CATH AND CORONARY ANGIOGRAPHY; Location: [facility]; Surgeon: Arnoldo Hooker, MD   LEFT HEART CATH AND CORS/GRAFTS ANGIOGRAPHY Left 01/13/2017   Procedure: Left Heart Cath and Cors/Grafts Angiography;  Surgeon: Lamar Blinks, MD;  Location: [facility];  Service: Cardiovascular;  Laterality: Left;   LEFT HEART CATH AND CORS/GRAFTS ANGIOGRAPHY Left 06/09/2023   Procedure: LEFT HEART CATH AND CORS/GRAFTS ANGIOGRAPHY;  Surgeon: Marcina Millard, MD;  Location: [facility];  Service: Cardiovascular;  Laterality: Left;   METATARSAL HEAD EXCISION Right 03/21/2020   Procedure: METATARSAL HEAD RESECTION RIGHT AND DEBRIDEMENT OF ULCER;  Surgeon: Felecia Shelling, DPM;  Location: [facility];  Service: Podiatry;  Laterality: Right;   METATARSAL HEAD EXCISION Left 09/15/2023   Procedure: METATARSAL HEAD EXCISION;  Surgeon: Felecia Shelling, DPM;  Location: [facility];  Service: Orthopedics/Podiatry;  Laterality: Left;   MULTIPLE TOOTH EXTRACTIONS     TEE WITHOUT CARDIOVERSION N/A 06/27/2016   Procedure: TRANSESOPHAGEAL ECHOCARDIOGRAM (TEE);  Surgeon: Kerin Perna, MD;   [facility];  Service: Open Heart Surgery;  Laterality: N/A;    Family History  Problem Relation Age of Onset   Hypertension Mother    Lupus Mother    Heart Problems Mother    Hypertension Father    Diabetes Father    Heart attack Father 31       died in his 62s   Drug abuse Brother    Heart disease Brother  Heart attack Maternal Grandmother    Arthritis Maternal Grandmother    Arthritis Maternal Grandfather    Heart attack Paternal Grandmother    Arthritis Paternal Grandmother    Arthritis Paternal Grandfather     Allergies  Allergen Reactions   Latex Other (See Comments)    Blister and burning (8M insulin pump latex band aid )   Nitroglycerin Nausea Only and Other (See Comments)    Patches only - headache    Statins Other (See Comments)    BODY PAIN, Pt taking Crestor*      Fluvastatin Other (See Comments)    Other reaction(s): Muscle pain   Fluvastatin Sodium     Other reaction(s): Myalgias   Rosuvastatin Other (See Comments)    Other reaction(s): Muscle pain, Myalgias   Simvastatin Other (See Comments)    Other reaction(s): Muscle pain, Myalgias   Ezetimibe Other (See Comments)    Other reaction(s): Muscle Pain   Lipitor [Atorvastatin] Other (See Comments) and Rash    BODY PAIN   Lisinopril Other (See Comments) and Nausea Only    Light headed, dizzy Other reaction(s): Dizziness    Current Outpatient Medications on File Prior to Visit  Medication Sig Dispense Refill   acetaminophen (TYLENOL) 500 MG tablet Take 1,000 mg by mouth every 6 (six) hours as needed for moderate pain (pain score 4-6).     AIMOVIG 140 MG/ML SOAJ Inject 140 mg into the skin every 30 (thirty) days.     albuterol (VENTOLIN HFA) 108 (90 Base) MCG/ACT inhaler Inhale 2 puffs into the lungs every 4 (four) hours as needed for wheezing. 1 each 0   aspirin 81 MG EC tablet Take 81 mg by mouth daily. Swallow whole.     CALCIUM PO Take 600 mg by mouth daily. 600/20iu     carvedilol (COREG)  12.5 MG tablet Take 12.5 mg by mouth 2 (two) times daily with a meal.     Cholecalciferol (VITAMIN D) 50 MCG (2000 UT) tablet Take 2,000 Units by mouth in the morning and at bedtime.     clopidogrel (PLAVIX) 75 MG tablet Take 1 tablet (75 mg total) by mouth daily. 30 tablet 0   famotidine (PEPCID) 20 MG tablet Take 1 tablet (20 mg total) by mouth daily. 30 tablet 0   fluticasone (FLONASE) 50 MCG/ACT nasal spray Place 1 spray into both nostrils daily as needed for allergies.     furosemide (LASIX) 40 MG tablet Take 1 tablet (40 mg total) by mouth daily. 30 tablet 1   glucagon 1 MG injection Inject 1 mg into the skin once as needed.     GLUTOSE 15 40 % GEL Take 1 Tube by mouth once as needed for low blood sugar.     insulin aspart (NOVOLOG) 100 UNIT/ML injection Inject 20 Units into the skin 3 (three) times daily. (Patient taking differently: Inject 10-20 Units into the skin 3 (three) times daily with meals. Sliding Scale will go up to 25 units for BG > 300 - 400) 10 mL 5   insulin glargine-yfgn (SEMGLEE) 100 UNIT/ML injection Inject 20 Units into the skin at bedtime.     isosorbide mononitrate (IMDUR) 60 MG 24 hr tablet Take 60 mg by mouth in the morning and at bedtime.     loratadine (CLARITIN) 10 MG tablet Take 10 mg by mouth daily.     nitroGLYCERIN (NITROSTAT) 0.4 MG SL tablet DISSOLVE 1 TABLET UNDER THE TONGUE EVERY 5 MINUTES AS NEEDED FOR CHEST PAIN 30  tablet 0   potassium chloride SA (K-DUR,KLOR-CON) 20 MEQ tablet Take 1 tablet (20 mEq total) by mouth daily. 30 tablet 1   rosuvastatin (CRESTOR) 20 MG tablet Take 1 tablet (20 mg total) by mouth daily at 6 PM. 30 tablet 1   Travoprost, BAK Free, (TRAVATAN) 0.004 % SOLN ophthalmic solution Place 1 drop into both eyes at bedtime.      vitamin C (ASCORBIC ACID) 500 MG tablet Take 500 mg by mouth 2 (two) times daily.      No current facility-administered medications on file prior to visit.    BP 134/88   Pulse 70   Temp 98.7 F (37.1 C)  (Temporal)   Ht 5' 10.5" (1.791 m)   Wt 296 lb (134.3 kg)   SpO2 98%   BMI 41.87 kg/m  Objective:   Physical Exam Cardiovascular:     Rate and Rhythm: Normal rate and regular rhythm.  Pulmonary:     Effort: Pulmonary effort is normal.     Breath sounds: Normal breath sounds.  Abdominal:     General: Bowel sounds are normal.     Palpations: Abdomen is soft.     Tenderness: There is no abdominal tenderness.  Musculoskeletal:     Cervical back: Neck supple.  Skin:    General: Skin is warm and dry.  Neurological:     Mental Status: He is alert and oriented to person, place, and time.  Psychiatric:        Mood and Affect: Mood normal.           Assessment & Plan:  Lower extremity edema Assessment & Plan: Stable.   Continue furosemide 40 mg daily and potassium chloride 20 mEq daily. Following with cardiology, reviewed office notes from November 2024.   Continue compression socks bilaterally.   Benign essential HTN Assessment & Plan: Controlled.  Continue carvedilol 12.5 mg BID, furosemide 40 mg daily.  Following with cardiology, office notes reviewed from November 2024.  Orders: -     Comprehensive metabolic panel  Stage 3a chronic kidney disease (HCC)  Hyperlipidemia, unspecified hyperlipidemia type Assessment & Plan: Repeat lipid panel pending.  Continue rosuvastatin 20 mg daily.  Orders: -     Comprehensive metabolic panel -     Lipid panel  Screening for prostate cancer -     PSA, Medicare  Encounter for immunization -     Pneumococcal conjugate vaccine 20-valent  Bilateral carotid artery stenosis Assessment & Plan: Stable.  Reviewed bilateral carotid ultrasound from November 2024. Continue clopidogrel 75 mg daily, rosuvastatin 20 mg daily. Repeat lipid panel pending.   Coronary artery disease involving native coronary artery of native heart with angina pectoris [facility]) Assessment & Plan: Following with cardiology.  Office notes reviewed  from November 2024. Cardiac catheterization reviewed from August 2024.  Continue Imdur 60 mg daily, blood pressure control, diabetes control, lipid control.   Stable angina (HCC) Assessment & Plan: Stable.  Continue Imdur 60 mg daily.   OSA (obstructive sleep apnea) Assessment & Plan: Continue CPAP nightly.   Gastroesophageal reflux disease, unspecified whether esophagitis present Assessment & Plan: Controlled.  Continue famotidine 20 mg daily.   Type 1 diabetes mellitus with hyperglycemia (HCC) Assessment & Plan: Stable.  Following with endocrinology, A1c reviewed from October 2024.  Continue Semglee 20 units at bedtime, NovoLog sliding scale with meals. Prevnar 20 provided today.    Ulcer of left foot with fat layer exposed [facility]) Assessment & Plan: Following with podiatry and wound management.  Office notes reviewed from wound management from December 2024.   Frequent headaches Assessment & Plan: Following with neurology, office notes reviewed from October 2024.  Continue Aimovig 140 mg monthly.         Doreene Nest, NP

## 2023-11-02 NOTE — Assessment & Plan Note (Signed)
Following with neurology, office notes reviewed from October 2024.  Continue Aimovig 140 mg monthly.

## 2023-11-02 NOTE — Assessment & Plan Note (Signed)
Following with cardiology.  Office notes reviewed from November 2024. Cardiac catheterization reviewed from August 2024.  Continue Imdur 60 mg daily, blood pressure control, diabetes control, lipid control.

## 2023-11-02 NOTE — Patient Instructions (Signed)
Stop by the lab prior to leaving today. I will notify you of your results once received.   Complete the Cologuard kit.   It was a pleasure to see you today!

## 2023-11-02 NOTE — Assessment & Plan Note (Addendum)
Stable.  Following with endocrinology, A1c reviewed from October 2024.  Continue Semglee 20 units at bedtime, NovoLog sliding scale with meals. Prevnar 20 provided today.

## 2023-11-02 NOTE — Assessment & Plan Note (Signed)
Repeat lipid panel pending. ? ?Continue rosuvastatin 20 mg daily. ?

## 2023-11-02 NOTE — Assessment & Plan Note (Signed)
Stable.  Continue Imdur 60 mg daily.

## 2023-11-02 NOTE — Assessment & Plan Note (Signed)
Following with podiatry and wound management. Office notes reviewed from wound management from December 2024.

## 2023-11-02 NOTE — Assessment & Plan Note (Signed)
Stable.  Reviewed bilateral carotid ultrasound from November 2024. Continue clopidogrel 75 mg daily, rosuvastatin 20 mg daily. Repeat lipid panel pending.

## 2023-11-02 NOTE — Assessment & Plan Note (Signed)
Controlled. ? ?Continue famotidine 20 mg daily.  ?

## 2023-11-02 NOTE — Assessment & Plan Note (Signed)
Controlled.  Continue carvedilol 12.5 mg BID, furosemide 40 mg daily.  Following with cardiology, office notes reviewed from November 2024.

## 2023-11-02 NOTE — Assessment & Plan Note (Signed)
Stable.   Continue furosemide 40 mg daily and potassium chloride 20 mEq daily. Following with cardiology, reviewed office notes from November 2024.   Continue compression socks bilaterally.

## 2023-11-03 ENCOUNTER — Encounter: Payer: Medicare Other | Admitting: Podiatry

## 2023-11-03 LAB — LIPID PANEL
Cholesterol: 130 mg/dL (ref 0–200)
HDL: 58.8 mg/dL (ref >39.00)
LDL Cholesterol: 54 mg/dL (ref 0–99)
NonHDL: 71.19
Total CHOL/HDL Ratio: 2
Triglycerides: 87 mg/dL (ref 0.0–149.0)
VLDL: 17.4 mg/dL (ref 0.0–40.0)

## 2023-11-03 LAB — COMPREHENSIVE METABOLIC PANEL
ALT: 12 U/L (ref 0–53)
AST: 14 U/L (ref 0–37)
Albumin: 4 g/dL (ref 3.5–5.2)
Alkaline Phosphatase: 90 U/L (ref 39–117)
BUN: 26 mg/dL — ABNORMAL HIGH (ref 6–23)
CO2: 30 meq/L (ref 19–32)
Calcium: 9.5 mg/dL (ref 8.4–10.5)
Chloride: 101 meq/L (ref 96–112)
Creatinine, Ser: 1.34 mg/dL (ref 0.40–1.50)
GFR: 52.06 mL/min — ABNORMAL LOW (ref >60.00)
Glucose, Bld: 115 mg/dL — ABNORMAL HIGH (ref 70–99)
Potassium: 4.2 meq/L (ref 3.5–5.1)
Sodium: 141 meq/L (ref 135–145)
Total Bilirubin: 0.7 mg/dL (ref 0.2–1.2)
Total Protein: 6.6 g/dL (ref 6.0–8.3)

## 2023-11-03 LAB — VAS US ABI WITH/WO TBI

## 2023-11-03 LAB — PSA, MEDICARE: PSA: 0.46 ng/mL (ref 0.10–4.00)

## 2023-11-06 ENCOUNTER — Encounter: Payer: Medicare Other | Attending: Physician Assistant | Admitting: Physician Assistant

## 2023-11-06 ENCOUNTER — Encounter: Payer: Self-pay | Admitting: Podiatry

## 2023-11-06 ENCOUNTER — Ambulatory Visit (INDEPENDENT_AMBULATORY_CARE_PROVIDER_SITE_OTHER): Payer: Medicare Other | Admitting: Podiatry

## 2023-11-06 DIAGNOSIS — L97522 Non-pressure chronic ulcer of other part of left foot with fat layer exposed: Secondary | ICD-10-CM | POA: Diagnosis not present

## 2023-11-06 DIAGNOSIS — E1040 Type 1 diabetes mellitus with diabetic neuropathy, unspecified: Secondary | ICD-10-CM | POA: Diagnosis not present

## 2023-11-06 DIAGNOSIS — I120 Hypertensive chronic kidney disease with stage 5 chronic kidney disease or end stage renal disease: Secondary | ICD-10-CM | POA: Diagnosis not present

## 2023-11-06 DIAGNOSIS — E10621 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: Secondary | ICD-10-CM | POA: Diagnosis present

## 2023-11-06 DIAGNOSIS — G4730 Sleep apnea, unspecified: Secondary | ICD-10-CM | POA: Insufficient documentation

## 2023-11-06 DIAGNOSIS — N186 End stage renal disease: Secondary | ICD-10-CM | POA: Insufficient documentation

## 2023-11-06 DIAGNOSIS — E1022 Type 1 diabetes mellitus with diabetic chronic kidney disease: Secondary | ICD-10-CM | POA: Insufficient documentation

## 2023-11-06 DIAGNOSIS — L97512 Non-pressure chronic ulcer of other part of right foot with fat layer exposed: Secondary | ICD-10-CM | POA: Insufficient documentation

## 2023-11-06 DIAGNOSIS — I2510 Atherosclerotic heart disease of native coronary artery without angina pectoris: Secondary | ICD-10-CM | POA: Insufficient documentation

## 2023-11-06 DIAGNOSIS — M86172 Other acute osteomyelitis, left ankle and foot: Secondary | ICD-10-CM

## 2023-11-06 MED ORDER — CIPROFLOXACIN HCL 500 MG PO TABS: 500.0000 mg | ORAL_TABLET | Freq: Two times a day (BID) | ORAL | 0 refills | Status: AC

## 2023-11-06 NOTE — H&P (View-Only) (Signed)
Chief Complaint  Patient presents with   Routine Post Op    POV DOS 09/22/2023 5TH MET HEAD RESECTION LT "They're not doing good."    Subjective:  Patient presents today status post fifth metatarsal head resection of the left foot.  DOS: 09/15/2023.  Patient also being seen at wound care center for management of ulcers to the distal tips of the bilateral feet.  He was seen earlier this morning at the wound care center and there was exposed necrotic portions of bone to the left fourth toe which were debrided today and sent to pathology.  [name] at [facility] contacted me today regarding the findings and likely need for amputation versus HBO2 therapy which is doubtful.  Patient has history of multiple prior toe amputations.  Past Medical History:  Diagnosis Date   Allergies    Anginal pain (HCC)    Bilateral carotid artery disease (HCC)    Breast pain, left 08/29/2022   CAD (coronary artery disease) 07/2007   a.) s/p NSTEMI 07/2007 - sig multi-vessel CAD not amenable to PCI; b.) LHC 06/11/2016: multi vessel CAD not amenable to PCI --> refer to CVTS; c.) s/p 4v CABG 06/27/2016; d.) LHC 06/09/2023: 75% mLM, 80% o-pLAD, 60% mLAD, 100% p-mLCx, 100% OM1, 95% pRCA, 100% mRCA, 50% m-dLAD. LIMA-LAD and SVG-OM1 patent. CTO SVG-RCA with L-R collaterals - med mgmt   Carpal tunnel syndrome on left    a.) s/p release 04/2016   CKD (chronic kidney disease), stage III (HCC)    COVID-19 12/09/2022   Diabetic peripheral neuropathy (HCC)    Diastolic dysfunction    a.) TTE 06/25/2016: EF 60-654%. no RWMAs, G1DD, mild AoV annular calc; b.) TTE 03/19/2017: EF 50-55%, no RWMAs, mild MR; c.) TTE 07/22/2019: EF 55-60%, midl LVH, no RWMAs, triv MR, mild TR   Dizziness 04/30/2021   Dyspnea    GERD (gastroesophageal reflux disease)    Glaucoma    Headache    History of bilateral cataract extraction    HLD (hyperlipidemia)    HTN (hypertension)    Long term current use of clopidogrel    Long-term use of  aspirin therapy    MRSA (methicillin resistant staph aureus) culture positive    2006 - 2008   Myocardial infarction (HCC) 04/04/2016   NSTEMI (non-ST elevated myocardial infarction) (HCC) 07/27/2007   a.) LHC 07/30/2007: 30% mLAD, 99% D1, 50% pLCx, 40% mLCx, 70/75/85% dLCx, 50% OM1, 75/80/80 OM2, 50% pRCA, 60% mRCA, 85% dRCA --> not amenable to PCI   OSA on CPAP    Osteoarthritis    PAD (peripheral artery disease) (HCC)    a. 03/2022 Lower Ext Angio: No signif Ao-iliac dzs. R PT diff dzs and occluded above ankle. R Pedal arch intact w/ excellent antegrade flow provided by R AT-->no indication for revasc.   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   a.) LIMA-LAD, SVG-RCA, seq SVG-OM1-OM2   Skin cancer, basal cell    Skin ulcer of right great toe, unspecified ulcer stage (HCC)    T2DM (type 2 diabetes mellitus) (HCC)    Wears glasses    Wears partial dentures    top    Past Surgical History:  Procedure Laterality Date   ABDOMINAL AORTOGRAM W/LOWER EXTREMITY N/A 03/26/2022   Procedure: ABDOMINAL AORTOGRAM W/LOWER EXTREMITY;  Surgeon: Iran Ouch, MD;  Location: [facility];  Service: Cardiovascular;  Laterality: N/A;   AMPUTATION TOE Bilateral 06/26/2023  Procedure: AMPUTATION TOE MPJ OF THE JOINT,PARTIAL TOE AMPUTUATION;  Surgeon: Felecia Shelling, DPM;  Location: [facility];  Service: Orthopedics/Podiatry;  Laterality: Bilateral;   APPENDECTOMY     CARDIAC CATHETERIZATION N/A 06/11/2016   Procedure: Left Heart Cath and Coronary Angiography;  Surgeon: Lamar Blinks, MD;  Location: [facility];  Service: Cardiovascular;  Laterality: N/A;   CARPAL TUNNEL RELEASE Left 04/24/2016   Procedure: CARPAL TUNNEL RELEASE;  Surgeon: Kennedy Bucker, MD;  Location: [facility];  Service: Orthopedics;  Laterality: Left;   CATARACT EXTRACTION W/ INTRAOCULAR LENS  IMPLANT, BILATERAL Bilateral    CATARACT EXTRACTION, BILATERAL     R eye 07/16/12, L eye  08/13/12 - with lens implant in both eyes   CORONARY ARTERY BYPASS GRAFT N/A 06/27/2016   Procedure: CORONARY ARTERY BYPASS GRAFTING times four using left internal mammary artery and right leg saphenous vein;  Surgeon: Kerin Perna, MD;  Location: [facility];  Service: Open Heart Surgery;  Laterality: N/A;   FOREARM FRACTURE SURGERY Left 1961   KNEE ARTHROPLASTY Left 02/11/2016   Procedure: COMPUTER ASSISTED TOTAL KNEE ARTHROPLASTY;  Surgeon: Donato Heinz, MD;  Location: [facility];  Service: Orthopedics;  Laterality: Left;   KNEE ARTHROSCOPY Right 2000   LEFT HEART CATH AND CORONARY ANGIOGRAPHY Left 07/30/2007   Procedure: LEFT HEART CATH AND CORONARY ANGIOGRAPHY; Location: [facility]; Surgeon: Arnoldo Hooker, MD   LEFT HEART CATH AND CORS/GRAFTS ANGIOGRAPHY Left 01/13/2017   Procedure: Left Heart Cath and Cors/Grafts Angiography;  Surgeon: Lamar Blinks, MD;  Location: [facility];  Service: Cardiovascular;  Laterality: Left;   LEFT HEART CATH AND CORS/GRAFTS ANGIOGRAPHY Left 06/09/2023   Procedure: LEFT HEART CATH AND CORS/GRAFTS ANGIOGRAPHY;  Surgeon: Marcina Millard, MD;  Location: [facility];  Service: Cardiovascular;  Laterality: Left;   METATARSAL HEAD EXCISION Right 03/21/2020   Procedure: METATARSAL HEAD RESECTION RIGHT AND DEBRIDEMENT OF ULCER;  Surgeon: Felecia Shelling, DPM;  Location: [facility] OR;  Service: Podiatry;  Laterality: Right;   METATARSAL HEAD EXCISION Left 09/15/2023   Procedure: METATARSAL HEAD EXCISION;  Surgeon: Felecia Shelling, DPM;  Location: [facility];  Service: Orthopedics/Podiatry;  Laterality: Left;   MULTIPLE TOOTH EXTRACTIONS     TEE WITHOUT CARDIOVERSION N/A 06/27/2016   Procedure: TRANSESOPHAGEAL ECHOCARDIOGRAM (TEE);  Surgeon: Kerin Perna, MD;  Location: [facility] OR;  Service: Open Heart Surgery;  Laterality: N/A;    Allergies  Allergen Reactions   Latex Other (See Comments)    Blister and burning (48M insulin pump latex band aid )    Nitroglycerin Nausea Only and Other (See Comments)    Patches only - headache    Statins Other (See Comments)    BODY PAIN, Pt taking Crestor*      Fluvastatin Other (See Comments)    Other reaction(s): Muscle pain   Fluvastatin Sodium     Other reaction(s): Myalgias   Rosuvastatin Other (See Comments)    Other reaction(s): Muscle pain, Myalgias   Simvastatin Other (See Comments)    Other reaction(s): Muscle pain, Myalgias   Ezetimibe Other (See Comments)    Other reaction(s): Muscle Pain   Lipitor [Atorvastatin] Other (See Comments) and Rash    BODY PAIN   Lisinopril Other (See Comments) and Nausea Only    Light headed, dizzy Other reaction(s): Dizziness    RT foot 11/06/2023  LT foot 11/06/2023  Objective/Physical Exam Ulcer noted to the left great toe as well as the left fourth digit with exposed  bone.  Moderate erythema and edema noted especially to the forefoot.  Chronic ulcer also noted to the distal portion of the right third toe.  Radiographic Exam LT foot 09/22/2023:  Interval amputation of the second and third digits left foot as well as the great toe and second digit to the right foot with resection of the fifth metatarsal head as well.  Clean osteotomies.  No gas within the tissue.  Assessment: 1. s/p amputation left third and right second. DOS: 06/26/2023; healed 2. PSxHx amputation RT great toe and 5th met head resection. LT second. 3.  Diabetes mellitus with peripheral polyneuropathy 4.  Ulcer left great toe and left fourth toe as well as the right third toe.  Ulcer plantar aspect of the fifth MTP left 5.  Concern for osteomyelitis to the left fourth toe  Plan of Care:  -Patient was evaluated.   - Diabetic shoes pending.  Order placed for diabetic shoes with custom molded Plastizote insoles on 08/07/2023. - Unfortunately the patient has failed conservative management for healing of the ulcers to the distal tips of the toes.  Concern for necrotic bone and likely  osteomyelitis specifically to the left fourth toe.  Discussing with the patient today I do believe that transmetatarsal amputation of the left foot is a viable solution to eradicate the osteomyelitis and create stability to the foot long-term.  This was discussed in detail with the patient including tendo Achilles lengthening and partial amputation of the right third toe.  Risk benefits advantages disadvantages the procedure were explained in detail to the patient.  No guarantees were expressed or implied.  Patient has a history of multiple prior toe amputations and is in agreement to proceed with transmetatarsal amputation -Authorization for surgery was initiated today.  Surgery will consist of transmetatarsal amputation left foot.  Tendo Achilles lengthening left.  Partial toe amputation right third digit. -In the meantime continue oral antibiotics leading up to surgery.  Refill prescription for ciprofloxacin 500 mg BID x 10 days -Return to clinic 1 week postop  Felecia Shelling, DPM [facility]  Dr. Felecia Shelling, DPM    [address] [place], [place] [address]                Office [phone]  Fax [phone]

## 2023-11-06 NOTE — Progress Notes (Signed)
 Lawrence Lawrence [name] ([id number]) [id number] Page 1 of 13 Visit Report for 11/06/2023 Arrival Information Details Patient Name: Date of Service: [name], [name] 11/06/2023 9:45 A M Medical Record Number: [id number] Patient Account Number: [id number] Date of Birth/Sex: Treating RN: September 19, 1951 (73 y.o. NETTY Lawrence [name] Primary Care Haruna Rohlfs: Gretta Crank Other Clinician: Referring Andrez Lieurance: Treating Jesus Poplin/Extender: Bethena Andre Gretta Crank Devra in Treatment: 2 Visit Information History Since Last Visit Added or deleted any medications: No Patient Arrived: Ambulatory Any new allergies or adverse reactions: No Arrival Time: 09:34 Had a fall or experienced change in No Accompanied By: wife activities of daily living that may affect Transfer Assistance: None risk of falls: Patient Identification Verified: Yes Signs or symptoms of abuse/neglect since last visito No Secondary Verification Process Completed: Yes Hospitalized since last visit: No Patient Requires Transmission-Based Precautions: No Has Dressing in Place as Prescribed: Yes Patient Has Alerts: Yes Pain Present Now: No Patient Alerts: Patient on Blood Thinner Type 1 Diabetes Plavix  R ABI [place] 11/02/23 L ABI [place] 11/02/23 Electronic Signature(s) Signed: 11/06/2023 12:54:59 PM By: Lawrence Blossom MSN RN CNS WTA Entered By: Lawrence [name] on 11/06/2023 06:41:55 -------------------------------------------------------------------------------- Clinic Level of Care Assessment Details Patient Name: Date of Service: [name], [name] 11/06/2023 9:45 A M Medical Record Number: [id number] Patient Account Number: [id number] Date of Birth/Sex: Treating RN: 08-05-1951 (73 y.o. NETTY Lawrence [name] Primary Care Ranelle Auker: Gretta Crank Other Clinician: Referring Maron Stanzione: Treating Alverta Caccamo/Extender: Bethena Andre Gretta Crank Devra in Treatment: 2 Clinic Level of Care Assessment Items TOOL 1 Quantity  Score []  - 0 Use when EandM and Procedure is performed on INITIAL visit ASSESSMENTS - Nursing Assessment / Reassessment []  - 0 General Physical Exam (combine w/ comprehensive assessment (listed just below) when performed on new pt. evals) []  - 0 Comprehensive Assessment (HX, ROS, Risk Assessments, Wounds Hx, etc.) JAKALEB, PAYER ([id number]) [phone].pdf Page 2 of 13 ASSESSMENTS - Wound and Skin Assessment / Reassessment []  - 0 Dermatologic / Skin Assessment (not related to wound area) ASSESSMENTS - Ostomy and/or Continence Assessment and Care []  - 0 Incontinence Assessment and Management []  - 0 Ostomy Care Assessment and Management (repouching, etc.) PROCESS - Coordination of Care []  - 0 Simple Patient / Family Education for ongoing care []  - 0 Complex (extensive) Patient / Family Education for ongoing care []  - 0 Staff obtains Chiropractor, Records, T Results / Process Orders est []  - 0 Staff telephones HHA, Nursing Homes / Clarify orders / etc []  - 0 Routine Transfer to another Facility (non-emergent condition) []  - 0 Routine Hospital Admission (non-emergent condition) []  - 0 New Admissions / Manufacturing Engineer / Ordering NPWT Apligraf, etc. , []  - 0 Emergency Hospital Admission (emergent condition) PROCESS - Special Needs []  - 0 Pediatric / Minor Patient Management []  - 0 Isolation Patient Management []  - 0 Hearing / Language / Visual special needs []  - 0 Assessment of Community assistance (transportation, D/C planning, etc.) []  - 0 Additional assistance / Altered mentation []  - 0 Support Surface(s) Assessment (bed, cushion, seat, etc.) INTERVENTIONS - Miscellaneous []  - 0 External ear exam []  - 0 Patient Transfer (multiple staff / Nurse, Adult / Similar devices) []  - 0 Simple Staple / Suture removal (25 or less) []  - 0 Complex Staple / Suture removal (26 or more) []  - 0 Hypo/Hyperglycemic Management (do not check if billed  separately) []  - 0 Ankle / Brachial Index (ABI) - do not check if billed separately Has the  patient been seen at the hospital within the last three years: Yes Total Score: 0 Level Of Care: ____ Electronic Signature(s) Signed: 11/06/2023 12:54:59 PM By: Lawrence Blossom MSN RN CNS WTA Entered By: Lawrence [name] on 11/06/2023 07:52:11 -------------------------------------------------------------------------------- Encounter Discharge Information Details Patient Name: Date of Service: [name], [name] 11/06/2023 9:45 A M Medical Record Number: [id number] Patient Account Number: [id number] Date of Birth/Sex: Treating RN: [birth year] (73 y.o. NETTY Lawrence [name] Primary Care Braylee Bosher: Gretta Crank Other Clinician: Referring Guyla Bless: Treating Koen Antilla/Extender: Bethena Andre Gretta Crank Devra in Treatment: 2 Henigan, Lawrence [name] ([id number]) [id number] Page 3 of 13 Encounter Discharge Information Items Post Procedure Vitals Discharge Condition: Stable Temperature (F): 97.6 Ambulatory Status: Cane Pulse (bpm): 66 Discharge Destination: Home Respiratory Rate (breaths/min): 18 Transportation: Private Auto Blood Pressure (mmHg): 141/60 Accompanied By: wife Schedule Follow-up Appointment: Yes Clinical Summary of Care: Electronic Signature(s) Signed: 11/06/2023 12:54:59 PM By: Lawrence Blossom MSN RN CNS WTA Entered By: Lawrence [name] on 11/06/2023 07:53:42 -------------------------------------------------------------------------------- Lower Extremity Assessment Details Patient Name: Date of Service: [name], [name] 11/06/2023 9:45 A M Medical Record Number: [id number] Patient Account Number: [id number] Date of Birth/Sex: Treating RN: 1950-11-18 (73 y.o. NETTY Lawrence [name] Primary Care Derian Dimalanta: Gretta Crank Other Clinician: Referring Torie Priebe: Treating Rachid Parham/Extender: Bethena Andre Gretta Crank Devra in Treatment: 2 Electronic Signature(s) Signed: 11/06/2023  12:54:59 PM By: Lawrence Blossom MSN RN CNS WTA Entered By: Lawrence [name] on 11/06/2023 06:57:05 -------------------------------------------------------------------------------- Multi Wound Chart Details Patient Name: Date of Service: [name], [name] 11/06/2023 9:45 A M Medical Record Number: [id number] Patient Account Number: [id number] Date of Birth/Sex: Treating RN: [birth year] (73 y.o. NETTY Lawrence [name] Primary Care Nahdia Doucet: Gretta Crank Other Clinician: Referring Kynadee Dam: Treating Kunaal Walkins/Extender: Bethena Andre Gretta Crank Devra in Treatment: 2 Vital Signs Height(in): 70 Pulse(bpm): 66 Weight(lbs): 290 Blood Pressure(mmHg): 141/60 Body Mass Index(BMI): 41.6 Temperature(F): 97.6 Respiratory Rate(breaths/min): 18 [5:Photos:] [phone].pdf Page 4 of 13] Right T Third oe Left T Fourth oe Left T Great oe Wound Location: Gradually Appeared Gradually Appeared Gradually Appeared Wounding Event: Diabetic Wound/Ulcer of the Lower Diabetic Wound/Ulcer of the Lower Diabetic Wound/Ulcer of the Lower Primary Etiology: Extremity Extremity Extremity Glaucoma, Sleep Apnea, Coronary Glaucoma, Sleep Apnea, Coronary Glaucoma, Sleep Apnea, Coronary Comorbid History: Artery Disease, Hypertension, Artery Disease, Hypertension, Artery Disease, Hypertension, Myocardial Infarction, Type I Diabetes, Myocardial Infarction, Type I Diabetes, Myocardial Infarction, Type I Diabetes, End Stage Renal Disease, Neuropathy End Stage Renal Disease, Neuropathy End Stage Renal Disease, Neuropathy 09/30/2023 09/30/2023 09/30/2023 Date Acquired: 2 2 2  Weeks of Treatment: Open Open Open Wound Status: No No No Wound Recurrence: 0.5x0.7x0.1 0.5x0.5x0.12 1.3x1.5x0.1 Measurements L x W x D (cm) 0.275 0.196 1.532 A (cm) : rea 0.027 0.024 0.153 Volume (cm) : 37.50% 0.00% -50.00% % Reduction in A rea: 38.60% -20.00% -50.00% % Reduction in Volume: Grade 2 Grade 2 Grade  2 Classification: Medium Medium Medium Exudate A mount: Serosanguineous Serosanguineous Serosanguineous Exudate Type: red, brown red, brown red, brown Exudate Color: Medium (34-66%) Medium (34-66%) None Present (0%) Granulation A mount: Pink Red N/A Granulation Quality: Medium (34-66%) Medium (34-66%) Medium (34-66%) Necrotic A mount: Adherent Slough N/A Eschar Necrotic Tissue: Fat Layer (Subcutaneous Tissue): Yes Fascia: No N/A Exposed Structures: Fascia: No Fat Layer (Subcutaneous Tissue): No Tendon: No Tendon: No Muscle: No Muscle: No Joint: No Joint: No Bone: No Bone: No None N/A None Epithelialization: Wound Number: 8 N/A N/A Photos: N/A N/A Left, Lateral Foot N/A N/A Wound Location: Gradually  Appeared N/A N/A Wounding Event: Diabetic Wound/Ulcer of the Lower N/A N/A Primary Etiology: Extremity Glaucoma, Sleep Apnea, Coronary N/A N/A Comorbid History: Artery Disease, Hypertension, Myocardial Infarction, Type I Diabetes, End Stage Renal Disease, Neuropathy 09/30/2023 N/A N/A Date Acquired: 2 N/A N/A Weeks of Treatment: Open N/A N/A Wound Status: No N/A N/A Wound Recurrence: 0.4x0.4x0.2 N/A N/A Measurements L x W x D (cm) 0.126 N/A N/A A (cm) : rea 0.025 N/A N/A Volume (cm) : 66.60% N/A N/A % Reduction in A rea: 66.70% N/A N/A % Reduction in Volume: Grade 2 N/A N/A Classification: Medium N/A N/A Exudate A mount: Serosanguineous N/A N/A Exudate Type: red, brown N/A N/A Exudate Color: None Present (0%) N/A N/A Granulation A mount: N/A N/A N/A Granulation Quality: Medium (34-66%) N/A N/A Necrotic A mount: Adherent Slough N/A N/A Necrotic Tissue: Fat Layer (Subcutaneous Tissue): Yes N/A N/A Exposed Structures: Fascia: No Tendon: No Muscle: No Joint: No Bone: No None N/A N/A Epithelialization: Lawrence Lawrence [name] ([id number]) [id number] Page 5 of 13 Treatment Notes Electronic Signature(s) Signed: 11/06/2023  12:54:59 PM By: Lawrence Blossom MSN RN CNS WTA Entered By: Lawrence [name] on 11/06/2023 06:59:51 -------------------------------------------------------------------------------- Multi-Disciplinary Care Plan Details Patient Name: Date of Service: [name], [name] 11/06/2023 9:45 A M Medical Record Number: [id number] Patient Account Number: [id number] Date of Birth/Sex: Treating RN: 09-08-1951 (73 y.o. NETTY Lawrence [name] Primary Care Obadiah Dennard: Gretta Crank Other Clinician: Referring Nicholos Aloisi: Treating Lenah Messenger/Extender: Bethena Andre Gretta Crank Devra in Treatment: 2 Active Inactive Necrotic Tissue Nursing Diagnoses: Impaired tissue integrity related to necrotic/devitalized tissue Knowledge deficit related to management of necrotic/devitalized tissue Goals: Necrotic/devitalized tissue will be minimized in the wound bed Date Initiated: 10/22/2023 Target Resolution Date: 11/22/2023 Goal Status: Active Patient/caregiver will verbalize understanding of reason and process for debridement of necrotic tissue Date Initiated: 10/22/2023 Target Resolution Date: 11/22/2023 Goal Status: Active Interventions: Assess patient pain level pre-, during and post procedure and prior to discharge Provide education on necrotic tissue and debridement process Treatment Activities: Apply topical anesthetic as ordered : 10/22/2023 Excisional debridement : 10/22/2023 Notes: Wound/Skin Impairment Nursing Diagnoses: Impaired tissue integrity Knowledge deficit related to ulceration/compromised skin integrity Goals: Ulcer/skin breakdown will have a volume reduction of 30% by week 4 Date Initiated: 10/22/2023 Target Resolution Date: 11/22/2023 Goal Status: Active Ulcer/skin breakdown will have a volume reduction of 50% by week 8 Date Initiated: 10/22/2023 Target Resolution Date: 12/23/2023 Goal Status: Active Ulcer/skin breakdown will have a volume reduction of 80% by week 12 Date Initiated:  10/22/2023 Target Resolution Date: 01/20/2024 Goal Status: Active Ulcer/skin breakdown will heal within 14 weeks Date Initiated: 10/22/2023 Target Resolution Date: 02/03/2024 Lawrence Lawrence [name] ([id number]) [id number].pdf Page 6 of 13 Goal Status: Active Interventions: Assess patient/caregiver ability to obtain necessary supplies Assess patient/caregiver ability to perform ulcer/skin care regimen upon admission and as needed Assess ulceration(s) every visit Provide education on ulcer and skin care Treatment Activities: Referred to DME Andri Prestia for dressing supplies : 10/22/2023 Skin care regimen initiated : 10/22/2023 Notes: Electronic Signature(s) Signed: 11/06/2023 12:54:59 PM By: Lawrence Blossom MSN RN CNS WTA Entered By: Lawrence [name] on 11/06/2023 07:52:31 -------------------------------------------------------------------------------- Pain Assessment Details Patient Name: Date of Service: [name], [name] 11/06/2023 9:45 A M Medical Record Number: [id number] Patient Account Number: [id number] Date of Birth/Sex: Treating RN: 10-16-1951 (73 y.o. NETTY Lawrence [name] Primary Care Makaelyn Aponte: Gretta Crank Other Clinician: Referring Raynah Gomes: Treating Crysten Kaman/Extender: Bethena Andre Gretta Crank Devra in Treatment: 2 Active Problems Location of Pain Severity and  Description of Pain Patient Has Paino Yes Site Locations Rate the pain. Current Pain Level: 2 Pain Management and Medication Current Pain Management: Electronic Signature(s) Signed: 11/06/2023 12:54:59 PM By: Lawrence Blossom MSN RN CNS WTA Entered By: Lawrence [name] on 11/06/2023 06:48:12 Heinzelman, Lawrence [name] ([id number]) [id number] Page 7 of 13 -------------------------------------------------------------------------------- Patient/Caregiver Education Details Patient Name: Date of Service: [name], [name] 1/3/2025andnbsp9:45 A M Medical Record Number: [id number] Patient  Account Number: [id number] Date of Birth/Gender: Treating RN: 1950-11-24 (73 y.o. NETTY Lawrence [name] Primary Care Physician: Gretta Crank Other Clinician: Referring Physician: Treating Physician/Extender: Bethena Andre Gretta Crank Devra in Treatment: 2 Education Assessment Education Provided To: Patient Education Topics Provided Wound Debridement: Handouts: Wound Debridement Methods: Explain/Verbal Responses: State content correctly Wound/Skin Impairment: Handouts: Caring for Your Ulcer Methods: Explain/Verbal Responses: State content correctly Electronic Signature(s) Signed: 11/06/2023 12:54:59 PM By: Lawrence Blossom MSN RN CNS WTA Entered By: Lawrence [name] on 11/06/2023 07:52:47 -------------------------------------------------------------------------------- Wound Assessment Details Patient Name: Date of Service: [name], [name] 11/06/2023 9:45 A M Medical Record Number: [id number] Patient Account Number: [id number] Date of Birth/Sex: Treating RN: 1951/02/11 (73 y.o. NETTY Lawrence [name] Primary Care Chanelle Hodsdon: Gretta Crank Other Clinician: Referring Prudencio Velazco: Treating Krystian Ferrentino/Extender: Bethena Andre Gretta Crank Devra in Treatment: 2 Wound Status Wound Number: 5 Primary Diabetic Wound/Ulcer of the Lower Extremity Etiology: Wound Location: Right T Third oe Wound Open Wounding Event: Gradually Appeared Status: Date Acquired: 09/30/2023 Comorbid Glaucoma, Sleep Apnea, Coronary Artery Disease, Hypertension, Weeks Of Treatment: 2 History: Myocardial Infarction, Type I Diabetes, End Stage Renal Disease, Clustered Wound: No Neuropathy Lawrence Lawrence [name] ([id number]) [id number] Page 8 of 13 Photos Wound Measurements Length: (cm) 0.5 Width: (cm) 0.7 Depth: (cm) 0.1 Area: (cm) 0.275 Volume: (cm) 0.027 % Reduction in Area: 37.5% % Reduction in Volume: 38.6% Epithelialization: None Wound Description Classification: Grade 2 Exudate Amount:  Medium Exudate Type: Serosanguineous Exudate Color: red, brown Wound Bed Granulation Amount: Medium (34-66%) Exposed Structure Granulation Quality: Pink Fascia Exposed: No Necrotic Amount: Medium (34-66%) Fat Layer (Subcutaneous Tissue) Exposed: Yes Necrotic Quality: Adherent Slough Tendon Exposed: No Muscle Exposed: No Joint Exposed: No Bone Exposed: No Treatment Notes Wound #5 (Toe Third) Wound Laterality: Right Cleanser Byram Ancillary Kit - 15 Day Supply Discharge Instruction: Use supplies as instructed; Kit contains: (15) Saline Bullets; (15) 3x3 Gauze; 15 pr Gloves Soap and Water Discharge Instruction: Gently cleanse wound with antibacterial soap, rinse and pat dry prior to dressing wounds Vashe 5.8 (oz) Discharge Instruction: Use vashe 5.8 (oz) as directed Peri-Wound Care Topical Gentamicin  Discharge Instruction: Apply as directed by Dontavius Keim. Primary Dressing Xeroform 4x4-HBD (in/in) Discharge Instruction: Apply Xeroform 4x4-HBD (in/in) as directed Secondary Dressing Gauze Discharge Instruction: As directed: dry, moistened with saline or moistened with Dakins Solution Secured With Medipore T - 75M Medipore H Soft Cloth Surgical T ape ape, 2x2 (in/yd) Kerlix Roll Sterile or Non-Sterile 6-ply 4.5x4 (yd/yd) Discharge Instruction: Apply Kerlix as directed Compression Wrap Compression Stockings Add-Ons Lawrence Lawrence [name] ([id number]) [id number] Page 9 of 13 Electronic Signature(s) Signed: 11/06/2023 12:54:59 PM By: Lawrence Blossom MSN RN CNS WTA Entered By: Lawrence [name] on 11/06/2023 06:55:34 -------------------------------------------------------------------------------- Wound Assessment Details Patient Name: Date of Service: [name], [name] 11/06/2023 9:45 A M Medical Record Number: [id number] Patient Account Number: [id number] Date of Birth/Sex: Treating RN: April 03, 1951 (73 y.o. NETTY Lawrence [name] Primary Care Greogory Cornette: Gretta Crank Other  Clinician: Referring Iysha Mishkin: Treating Davena Julian/Extender: Bethena Andre Gretta Crank Devra in Treatment: 2 Wound Status  Wound Number: 6 Primary Diabetic Wound/Ulcer of the Lower Extremity Etiology: Wound Location: Left T Fourth oe Wound Open Wounding Event: Gradually Appeared Status: Date Acquired: 09/30/2023 Comorbid Glaucoma, Sleep Apnea, Coronary Artery Disease, Hypertension, Weeks Of Treatment: 2 History: Myocardial Infarction, Type I Diabetes, End Stage Renal Disease, Clustered Wound: No Neuropathy Photos Wound Measurements Length: (cm) 0.5 Width: (cm) 0.5 Depth: (cm) 0.12 Area: (cm) 0.196 Volume: (cm) 0.024 % Reduction in Area: 0% % Reduction in Volume: -20% Wound Description Classification: Grade 2 Exudate Amount: Medium Exudate Type: Serosanguineous Exudate Color: red, brown Foul Odor After Cleansing: No Slough/Fibrino No Wound Bed Granulation Amount: Medium (34-66%) Exposed Structure Granulation Quality: Red Fascia Exposed: No Necrotic Amount: Medium (34-66%) Fat Layer (Subcutaneous Tissue) Exposed: No Tendon Exposed: No Muscle Exposed: No Joint Exposed: No Bone Exposed: No Treatment Notes ZENAS, SANTA ([id number]) [id number] Page 10 of 13 Wound #6 (Toe Fourth) Wound Laterality: Left Cleanser Byram Ancillary Kit - 15 Day Supply Discharge Instruction: Use supplies as instructed; Kit contains: (15) Saline Bullets; (15) 3x3 Gauze; 15 pr Gloves Soap and Water Discharge Instruction: Gently cleanse wound with antibacterial soap, rinse and pat dry prior to dressing wounds Vashe 5.8 (oz) Discharge Instruction: Use vashe 5.8 (oz) as directed Peri-Wound Care Topical Gentamicin  Discharge Instruction: Apply as directed by Rik Wadel. Primary Dressing Xeroform 4x4-HBD (in/in) Discharge Instruction: Apply Xeroform 4x4-HBD (in/in) as directed Secondary Dressing Gauze Discharge Instruction: As directed: dry, moistened with saline  or moistened with Dakins Solution Secured With Medipore T - 58M Medipore H Soft Cloth Surgical T ape ape, 2x2 (in/yd) Kerlix Roll Sterile or Non-Sterile 6-ply 4.5x4 (yd/yd) Discharge Instruction: Apply Kerlix as directed Compression Wrap Compression Stockings Add-Ons Electronic Signature(s) Signed: 11/06/2023 12:54:59 PM By: Lawrence Blossom MSN RN CNS WTA Entered By: Lawrence [name] on 11/06/2023 06:55:55 -------------------------------------------------------------------------------- Wound Assessment Details Patient Name: Date of Service: [name], [name] 11/06/2023 9:45 A M Medical Record Number: [id number] Patient Account Number: [id number] Date of Birth/Sex: Treating RN: 24-Mar-1951 (73 y.o. NETTY Lawrence [name] Primary Care Kiara Keep: Gretta Crank Other Clinician: Referring Aedan Geimer: Treating Muriah Harsha/Extender: Bethena Andre Gretta Crank Devra in Treatment: 2 Wound Status Wound Number: 7 Primary Diabetic Wound/Ulcer of the Lower Extremity Etiology: Wound Location: Left T Great oe Wound Open Wounding Event: Gradually Appeared Status: Date Acquired: 09/30/2023 Comorbid Glaucoma, Sleep Apnea, Coronary Artery Disease, Hypertension, Weeks Of Treatment: 2 History: Myocardial Infarction, Type I Diabetes, End Stage Renal Disease, Clustered Wound: No Neuropathy Photos GOTTLIEB, ZUERCHER ([id number]) [id number] Page 11 of 13 Wound Measurements Length: (cm) 1.3 Width: (cm) 1.5 Depth: (cm) 0.1 Area: (cm) 1.532 Volume: (cm) 0.153 % Reduction in Area: -50% % Reduction in Volume: -50% Epithelialization: None Wound Description Classification: Grade 2 Exudate Amount: Medium Exudate Type: Serosanguineous Exudate Color: red, brown Foul Odor After Cleansing: No Slough/Fibrino No Wound Bed Granulation Amount: None Present (0%) Necrotic Amount: Medium (34-66%) Necrotic Quality: Eschar Treatment Notes Wound #7 (Toe Great) Wound Laterality:  Left Cleanser Byram Ancillary Kit - 15 Day Supply Discharge Instruction: Use supplies as instructed; Kit contains: (15) Saline Bullets; (15) 3x3 Gauze; 15 pr Gloves Soap and Water Discharge Instruction: Gently cleanse wound with antibacterial soap, rinse and pat dry prior to dressing wounds Vashe 5.8 (oz) Discharge Instruction: Use vashe 5.8 (oz) as directed Peri-Wound Care Topical Gentamicin  Discharge Instruction: Apply as directed by Embry Huss. Primary Dressing Xeroform 4x4-HBD (in/in) Discharge Instruction: Apply Xeroform 4x4-HBD (in/in) as directed Secondary Dressing Gauze Discharge Instruction: As directed: dry, moistened with saline or moistened with Dakins  Solution Secured With Medipore T - 12M Medipore H Soft Cloth Surgical T ape ape, 2x2 (in/yd) Kerlix Roll Sterile or Non-Sterile 6-ply 4.5x4 (yd/yd) Discharge Instruction: Apply Kerlix as directed Compression Wrap Compression Stockings Add-Ons Electronic Signature(s) Signed: 11/06/2023 12:54:59 PM By: Lawrence Blossom MSN RN CNS WTA Entered By: Lawrence [name] on 11/06/2023 06:56:20 Vergara, Lawrence [name] ([id number]) [id number] Page 12 of 13 -------------------------------------------------------------------------------- Wound Assessment Details Patient Name: Date of Service: [name], [name] 11/06/2023 9:45 A M Medical Record Number: [id number] Patient Account Number: [id number] Date of Birth/Sex: Treating RN: 1950-12-18 (72 y.o. NETTY Lawrence [name] Primary Care Baylon Santelli: Gretta Crank Other Clinician: Referring Charne Mcbrien: Treating Elishia Kaczorowski/Extender: Bethena Andre Gretta Crank Devra in Treatment: 2 Wound Status Wound Number: 8 Primary Diabetic Wound/Ulcer of the Lower Extremity Etiology: Wound Location: Left, Lateral Foot Wound Open Wounding Event: Gradually Appeared Status: Date Acquired: 09/30/2023 Comorbid Glaucoma, Sleep Apnea, Coronary Artery Disease, Hypertension, Weeks Of Treatment:  2 History: Myocardial Infarction, Type I Diabetes, End Stage Renal Disease, Clustered Wound: No Neuropathy Photos Wound Measurements Length: (cm) 0.4 Width: (cm) 0.4 Depth: (cm) 0.2 Area: (cm) 0.126 Volume: (cm) 0.025 % Reduction in Area: 66.6% % Reduction in Volume: 66.7% Epithelialization: None Wound Description Classification: Grade 2 Exudate Amount: Medium Exudate Type: Serosanguineous Exudate Color: red, brown Wound Bed Granulation Amount: None Present (0%) Exposed Structure Necrotic Amount: Medium (34-66%) Fascia Exposed: No Necrotic Quality: Adherent Slough Fat Layer (Subcutaneous Tissue) Exposed: Yes Tendon Exposed: No Muscle Exposed: No Joint Exposed: No Bone Exposed: No Treatment Notes Wound #8 (Foot) Wound Laterality: Left, Lateral Cleanser Byram Ancillary Kit - 15 Day Supply Discharge Instruction: Use supplies as instructed; Kit contains: (15) Saline Bullets; (15) 3x3 Gauze; 15 pr [id number] and [address] BOUBACAR, LERETTE ([id number]) [phone].pdf Page 13 of 13 Discharge Instruction: Gently cleanse wound with antibacterial soap, rinse and pat dry prior to dressing wounds Vashe 5.8 (oz) Discharge Instruction: Use vashe 5.8 (oz) as directed Peri-Wound Care Topical Gentamicin  Discharge Instruction: Apply as directed by Apryle Stowell. Primary Dressing Xeroform 4x4-HBD (in/in) Discharge Instruction: Apply Xeroform 4x4-HBD (in/in) as directed Secondary Dressing Gauze Discharge Instruction: As directed: dry, moistened with saline or moistened with Dakins Solution Secured With Medipore T - 12M Medipore H Soft Cloth Surgical T ape ape, 2x2 (in/yd) Kerlix Roll Sterile or Non-Sterile 6-ply 4.5x4 (yd/yd) Discharge Instruction: Apply Kerlix as directed Compression Wrap Compression Stockings Add-Ons Electronic Signature(s) Signed: 11/06/2023 12:54:59 PM By: Lawrence Blossom MSN RN CNS WTA Entered By: Lawrence [name] on 11/06/2023  06:56:43 -------------------------------------------------------------------------------- Vitals Details Patient Name: Date of Service: [name], [name] 11/06/2023 9:45 A M Medical Record Number: [id number] Patient Account Number: [id number] Date of Birth/Sex: Treating RN: 10-25-1951 (73 y.o. NETTY Lawrence [name] Primary Care Apollo Timothy: Gretta Crank Other Clinician: Referring Chia Mowers: Treating Lindy Garczynski/Extender: Bethena Andre Gretta Crank Devra in Treatment: 2 Vital Signs Time Taken: 09:44 Temperature (F): 97.6 Height (in): 70 Pulse (bpm): 66 Weight (lbs): 290 Respiratory Rate (breaths/min): 18 Body Mass Index (BMI): 41.6 Blood Pressure (mmHg): 141/60 Reference Range: 80 - 120 mg / dl Electronic Signature(s) Signed: 11/06/2023 12:54:59 PM By: Lawrence Blossom MSN RN CNS WTA Entered By: Lawrence [name] on 11/06/2023 06:48:01

## 2023-11-06 NOTE — Progress Notes (Signed)
 Chief Complaint  Patient presents with   Routine Post Op    POV DOS 09/22/2023 5TH MET HEAD RESECTION LT "They're not doing good."    Subjective:  Patient presents today status post fifth metatarsal head resection of the left foot.  DOS: 09/15/2023.  Patient also being seen at wound care center for management of ulcers to the distal tips of the bilateral feet.  He was seen earlier this morning at the wound care center and there was exposed necrotic portions of bone to the left fourth toe which were debrided today and sent to pathology.  [name] at [facility] contacted me today regarding the findings and likely need for amputation versus HBO2 therapy which is doubtful.  Patient has history of multiple prior toe amputations.  Past Medical History:  Diagnosis Date   Allergies    Anginal pain (HCC)    Bilateral carotid artery disease (HCC)    Breast pain, left 08/29/2022   CAD (coronary artery disease) 07/2007   a.) s/p NSTEMI 07/2007 - sig multi-vessel CAD not amenable to PCI; b.) LHC 06/11/2016: multi vessel CAD not amenable to PCI --> refer to CVTS; c.) s/p 4v CABG 06/27/2016; d.) LHC 06/09/2023: 75% mLM, 80% o-pLAD, 60% mLAD, 100% p-mLCx, 100% OM1, 95% pRCA, 100% mRCA, 50% m-dLAD. LIMA-LAD and SVG-OM1 patent. CTO SVG-RCA with L-R collaterals - med mgmt   Carpal tunnel syndrome on left    a.) s/p release 04/2016   CKD (chronic kidney disease), stage III (HCC)    COVID-19 12/09/2022   Diabetic peripheral neuropathy (HCC)    Diastolic dysfunction    a.) TTE 06/25/2016: EF 60-654%. no RWMAs, G1DD, mild AoV annular calc; b.) TTE 03/19/2017: EF 50-55%, no RWMAs, mild MR; c.) TTE 07/22/2019: EF 55-60%, midl LVH, no RWMAs, triv MR, mild TR   Dizziness 04/30/2021   Dyspnea    GERD (gastroesophageal reflux disease)    Glaucoma    Headache    History of bilateral cataract extraction    HLD (hyperlipidemia)    HTN (hypertension)    Long term current use of clopidogrel    Long-term use of  aspirin therapy    MRSA (methicillin resistant staph aureus) culture positive    2006 - 2008   Myocardial infarction (HCC) 04/04/2016   NSTEMI (non-ST elevated myocardial infarction) (HCC) 07/27/2007   a.) LHC 07/30/2007: 30% mLAD, 99% D1, 50% pLCx, 40% mLCx, 70/75/85% dLCx, 50% OM1, 75/80/80 OM2, 50% pRCA, 60% mRCA, 85% dRCA --> not amenable to PCI   OSA on CPAP    Osteoarthritis    PAD (peripheral artery disease) (HCC)    a. 03/2022 Lower Ext Angio: No signif Ao-iliac dzs. R PT diff dzs and occluded above ankle. R Pedal arch intact w/ excellent antegrade flow provided by R AT-->no indication for revasc.   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   a.) LIMA-LAD, SVG-RCA, seq SVG-OM1-OM2   Skin cancer, basal cell    Skin ulcer of right great toe, unspecified ulcer stage (HCC)    T2DM (type 2 diabetes mellitus) (HCC)    Wears glasses    Wears partial dentures    top    Past Surgical History:  Procedure Laterality Date   ABDOMINAL AORTOGRAM W/LOWER EXTREMITY N/A 03/26/2022   Procedure: ABDOMINAL AORTOGRAM W/LOWER EXTREMITY;  Surgeon: Iran Ouch, MD;  Location: [facility];  Service: Cardiovascular;  Laterality: N/A;   AMPUTATION TOE Bilateral 06/26/2023  Procedure: AMPUTATION TOE MPJ OF THE JOINT,PARTIAL TOE AMPUTUATION;  Surgeon: Felecia Shelling, DPM;  Location: [facility] OR[facility];  Service: Orthopedics/Podiatry;  Laterality: Bilateral;   APPENDECTOMY     CARDIAC CATHETERIZATION N/A 06/11/2016   Procedure: Left Heart Cath and Coronary Angiography;  Surgeon: Lamar Blinks, MD;  Location: [facility];  Service: Cardiovascular;  Laterality: N/A;   CARPAL TUNNEL RELEASE Left 04/24/2016   Procedure: CARPAL TUNNEL RELEASE;  Surgeon: Kennedy Bucker, MD;  Location: [facility];  Service: Orthopedics;  Laterality: Left;   CATARACT EXTRACTION W/ INTRAOCULAR LENS  IMPLANT, BILATERAL Bilateral    CATARACT EXTRACTION, BILATERAL     R eye 07/16/12, L eye  08/13/12 - with lens implant in both eyes   CORONARY ARTERY BYPASS GRAFT N/A 06/27/2016   Procedure: CORONARY ARTERY BYPASS GRAFTING times four using left internal mammary artery and right leg saphenous vein;  Surgeon: Kerin Perna, MD;  Location: [facility];  Service: Open Heart Surgery;  Laterality: N/A;   FOREARM FRACTURE SURGERY Left 1961   KNEE ARTHROPLASTY Left 02/11/2016   Procedure: COMPUTER ASSISTED TOTAL KNEE ARTHROPLASTY;  Surgeon: Donato Heinz, MD;  Location: [facility];  Service: Orthopedics;  Laterality: Left;   KNEE ARTHROSCOPY Right 2000   LEFT HEART CATH AND CORONARY ANGIOGRAPHY Left 07/30/2007   Procedure: LEFT HEART CATH AND CORONARY ANGIOGRAPHY; Location: [facility]; Surgeon: Arnoldo Hooker, MD   LEFT HEART CATH AND CORS/GRAFTS ANGIOGRAPHY Left 01/13/2017   Procedure: Left Heart Cath and Cors/Grafts Angiography;  Surgeon: Lamar Blinks, MD;  Location: [facility];  Service: Cardiovascular;  Laterality: Left;   LEFT HEART CATH AND CORS/GRAFTS ANGIOGRAPHY Left 06/09/2023   Procedure: LEFT HEART CATH AND CORS/GRAFTS ANGIOGRAPHY;  Surgeon: Marcina Millard, MD;  Location: [facility];  Service: Cardiovascular;  Laterality: Left;   METATARSAL HEAD EXCISION Right 03/21/2020   Procedure: METATARSAL HEAD RESECTION RIGHT AND DEBRIDEMENT OF ULCER;  Surgeon: Felecia Shelling, DPM;  Location: [facility] OR;  Service: Podiatry;  Laterality: Right;   METATARSAL HEAD EXCISION Left 09/15/2023   Procedure: METATARSAL HEAD EXCISION;  Surgeon: Felecia Shelling, DPM;  Location: [facility];  Service: Orthopedics/Podiatry;  Laterality: Left;   MULTIPLE TOOTH EXTRACTIONS     TEE WITHOUT CARDIOVERSION N/A 06/27/2016   Procedure: TRANSESOPHAGEAL ECHOCARDIOGRAM (TEE);  Surgeon: Kerin Perna, MD;  Location: [facility] OR;  Service: Open Heart Surgery;  Laterality: N/A;    Allergies  Allergen Reactions   Latex Other (See Comments)    Blister and burning (48M insulin pump latex band aid )    Nitroglycerin Nausea Only and Other (See Comments)    Patches only - headache    Statins Other (See Comments)    BODY PAIN, Pt taking Crestor*      Fluvastatin Other (See Comments)    Other reaction(s): Muscle pain   Fluvastatin Sodium     Other reaction(s): Myalgias   Rosuvastatin Other (See Comments)    Other reaction(s): Muscle pain, Myalgias   Simvastatin Other (See Comments)    Other reaction(s): Muscle pain, Myalgias   Ezetimibe Other (See Comments)    Other reaction(s): Muscle Pain   Lipitor [Atorvastatin] Other (See Comments) and Rash    BODY PAIN   Lisinopril Other (See Comments) and Nausea Only    Light headed, dizzy Other reaction(s): Dizziness    RT foot 11/06/2023  LT foot 11/06/2023  Objective/Physical Exam Ulcer noted to the left great toe as well as the left fourth digit with exposed  bone.  Moderate erythema and edema noted especially to the forefoot.  Chronic ulcer also noted to the distal portion of the right third toe.  Radiographic Exam LT foot 09/22/2023:  Interval amputation of the second and third digits left foot as well as the great toe and second digit to the right foot with resection of the fifth metatarsal head as well.  Clean osteotomies.  No gas within the tissue.  Assessment: 1. s/p amputation left third and right second. DOS: 06/26/2023; healed 2. PSxHx amputation RT great toe and 5th met head resection. LT second. 3.  Diabetes mellitus with peripheral polyneuropathy 4.  Ulcer left great toe and left fourth toe as well as the right third toe.  Ulcer plantar aspect of the fifth MTP left 5.  Concern for osteomyelitis to the left fourth toe  Plan of Care:  -Patient was evaluated.   - Diabetic shoes pending.  Order placed for diabetic shoes with custom molded Plastizote insoles on 08/07/2023. - Unfortunately the patient has failed conservative management for healing of the ulcers to the distal tips of the toes.  Concern for necrotic bone and likely  osteomyelitis specifically to the left fourth toe.  Discussing with the patient today I do believe that transmetatarsal amputation of the left foot is a viable solution to eradicate the osteomyelitis and create stability to the foot long-term.  This was discussed in detail with the patient including tendo Achilles lengthening and partial amputation of the right third toe.  Risk benefits advantages disadvantages the procedure were explained in detail to the patient.  No guarantees were expressed or implied.  Patient has a history of multiple prior toe amputations and is in agreement to proceed with transmetatarsal amputation -Authorization for surgery was initiated today.  Surgery will consist of transmetatarsal amputation left foot.  Tendo Achilles lengthening left.  Partial toe amputation right third digit. -In the meantime continue oral antibiotics leading up to surgery.  Refill prescription for ciprofloxacin 500 mg BID x 10 days -Return to clinic 1 week postop  Felecia Shelling, DPM [facility]  Dr. Felecia Shelling, DPM    [address] [place], [place] [address]                Office [phone]  Fax [phone]

## 2023-11-06 NOTE — Progress Notes (Signed)
 Lawrence [name] ([id number]) [id number] Page 1 of 12 Visit Report for 11/06/2023 Chief Complaint Document Details Patient Name: Date of Service: [name], [name] 11/06/2023 9:45 A M Medical Record Number: [id number] Patient Account Number: [id number] Date of Birth/Sex: Treating RN: 1951-04-12 (73 y.o. NETTY Lawrence [name] Primary Care Provider: Gretta Crank Other Clinician: Referring Provider: Treating Provider/Extender: Bethena Andre Gretta Crank Devra in Treatment: 2 Information Obtained from: Patient Chief Complaint Bilateral foot ulcers Electronic Signature(s) Signed: 11/06/2023 11:16:52 AM By: Bethena Andre PA-C Entered By: Bethena Andre on 11/06/2023 11:16:52 -------------------------------------------------------------------------------- Debridement Details Patient Name: Date of Service: [name], [name] 11/06/2023 9:45 A M Medical Record Number: [id number] Patient Account Number: [id number] Date of Birth/Sex: Treating RN: Jul 06, 1951 (73 y.o. NETTY Lawrence [name] Primary Care Provider: Gretta Crank Other Clinician: Referring Provider: Treating Provider/Extender: Bethena Andre Gretta Crank Devra in Treatment: 2 Debridement Performed for Assessment: Wound #5 Right T Third oe Performed By: Physician Bethena Andre, PA-C The following information was scribed by: Lawrence [name] The information was scribed for: Bethena Andre Debridement Type: Debridement Severity of Tissue Pre Debridement: Fat layer exposed Level of Consciousness (Pre-procedure): Awake and Alert Pre-procedure Verification/Time Out Yes - 10:34 Taken: Start Time: 10:34 Pain Control: Lidocaine  4% T opical Solution Percent of Wound Bed Debrided: 100% T Area Debrided (cm): otal 0.27 Tissue and other material debrided: Viable, Non-Viable, Callus, Subcutaneous Level: Skin/Subcutaneous Tissue Debridement Description: Excisional Instrument: Curette Bleeding: Minimum Lawrence [name]  ([id number]) [id number] Page 2 of 12 Hemostasis Achieved: Pressure Response to Treatment: Procedure was tolerated well Level of Consciousness (Post- Awake and Alert procedure): Post Debridement Measurements of Total Wound Length: (cm) 0.5 Width: (cm) 0.7 Depth: (cm) 0.1 Volume: (cm) 0.027 Character of Wound/Ulcer Post Debridement: Stable Severity of Tissue Post Debridement: Fat layer exposed Post Procedure Diagnosis Same as Pre-procedure Electronic Signature(s) Signed: 11/06/2023 12:28:46 PM By: Bethena Andre PA-C Signed: 11/06/2023 12:54:59 PM By: Lawrence Blossom MSN RN CNS WTA Entered By: Bethena Andre on 11/06/2023 12:28:46 -------------------------------------------------------------------------------- Debridement Details Patient Name: Date of Service: [name], [name] 11/06/2023 9:45 A M Medical Record Number: [id number] Patient Account Number: [id number] Date of Birth/Sex: Treating RN: 01/01/1951 (73 y.o. NETTY Lawrence [name] Primary Care Provider: Gretta Crank Other Clinician: Referring Provider: Treating Provider/Extender: Bethena Andre Gretta Crank Devra in Treatment: 2 Debridement Performed for Assessment: Wound #6 Left T Fourth oe Performed By: Physician Bethena Andre, PA-C Debridement Type: Debridement Severity of Tissue Pre Debridement: Fat layer exposed Level of Consciousness (Pre-procedure): Awake and Alert Pre-procedure Verification/Time Out Yes - 10:34 Taken: Start Time: 10:34 Pain Control: Lidocaine  4% T opical Solution Percent of Wound Bed Debrided: 100% T Area Debrided (cm): otal 0.2 Tissue and other material debrided: Viable, Non-Viable, Bone, Callus, Slough, Subcutaneous, Slough Level: Skin/Subcutaneous Tissue/Muscle/Bone Debridement Description: Excisional Instrument: Curette Specimen: Swab, Number of Specimens T aken: 1 Bleeding: Minimum Hemostasis Achieved: Silver Nitrate Procedural Pain: 0 Post Procedural Pain: 0 Response  to Treatment: Procedure was tolerated well Level of Consciousness (Post- Awake and Alert procedure): Post Debridement Measurements of Total Wound Length: (cm) 0.5 Width: (cm) 0.5 Depth: (cm) 0.1 Volume: (cm) 0.02 Character of Wound/Ulcer Post Debridement: Stable Severity of Tissue Post Debridement: Fat layer exposed Lawrence [name] ([id number]) [id number] Page 3 of 12 Post Procedure Diagnosis Same as Pre-procedure Electronic Signature(s) Signed: 11/06/2023 12:28:59 PM By: Bethena Andre PA-C Signed: 11/06/2023 12:54:59 PM By: Lawrence Blossom MSN RN CNS WTA Entered By: Bethena Andre on 11/06/2023 12:28:59 -------------------------------------------------------------------------------- Debridement Details  Patient Name: Date of Service: [name], [name] 11/06/2023 9:45 A M Medical Record Number: [id number] Patient Account Number: [id number] Date of Birth/Sex: Treating RN: 1951-03-30 (73 y.o. NETTY Lawrence [name] Primary Care Provider: Gretta Crank Other Clinician: Referring Provider: Treating Provider/Extender: Bethena Andre Gretta Crank Devra in Treatment: 2 Debridement Performed for Assessment: Wound #7 Left T Great oe Performed By: Physician Bethena Andre, PA-C Debridement Type: Debridement Severity of Tissue Pre Debridement: Fat layer exposed Level of Consciousness (Pre-procedure): Awake and Alert Pre-procedure Verification/Time Out Yes - 10:34 Taken: Start Time: 10:34 Pain Control: Lidocaine  4% T opical Solution Percent of Wound Bed Debrided: 100% T Area Debrided (cm): otal 1.53 Tissue and other material debrided: Viable, Non-Viable, Callus, Subcutaneous, Skin: Dermis , Skin: Epidermis Level: Skin/Subcutaneous Tissue Debridement Description: Excisional Instrument: Curette Bleeding: Minimum Hemostasis Achieved: Pressure Procedural Pain: 0 Post Procedural Pain: 0 Response to Treatment: Procedure was tolerated well Level of Consciousness (Post-  Awake and Alert procedure): Post Debridement Measurements of Total Wound Length: (cm) 1.3 Width: (cm) 1.5 Depth: (cm) 0.1 Volume: (cm) 0.153 Character of Wound/Ulcer Post Debridement: Stable Severity of Tissue Post Debridement: Fat layer exposed Post Procedure Diagnosis Same as Pre-procedure Electronic Signature(s) Signed: 11/06/2023 12:29:29 PM By: Bethena Andre PA-C Signed: 11/06/2023 12:54:59 PM By: Lawrence Blossom MSN RN CNS WTA Entered By: Bethena Andre on 11/06/2023 12:29:29 Lawrence [name] ([id number]) [id number] Page 4 of 12 -------------------------------------------------------------------------------- Debridement Details Patient Name: Date of Service: [name], [name] 11/06/2023 9:45 A M Medical Record Number: [id number] Patient Account Number: [id number] Date of Birth/Sex: Treating RN: 02/07/1951 (73 y.o. NETTY Lawrence [name] Primary Care Provider: Gretta Crank Other Clinician: Referring Provider: Treating Provider/Extender: Bethena Andre Gretta Crank Devra in Treatment: 2 Debridement Performed for Assessment: Wound #8 Left,Lateral Foot Performed By: Physician Bethena Andre, PA-C Debridement Type: Debridement Severity of Tissue Pre Debridement: Fat layer exposed Level of Consciousness (Pre-procedure): Awake and Alert Pre-procedure Verification/Time Out Yes - 10:34 Taken: Start Time: 10:34 Pain Control: Lidocaine  4% T opical Solution Percent of Wound Bed Debrided: 100% T Area Debrided (cm): otal 0.13 Tissue and other material debrided: Viable, Non-Viable, Callus, Slough, Subcutaneous, Slough Level: Skin/Subcutaneous Tissue Debridement Description: Excisional Instrument: Curette Bleeding: Minimum Hemostasis Achieved: Pressure Procedural Pain: 0 Post Procedural Pain: 0 Response to Treatment: Procedure was tolerated well Level of Consciousness (Post- Awake and Alert procedure): Post Debridement Measurements of Total Wound Length: (cm)  0.4 Width: (cm) 0.4 Depth: (cm) 0.2 Volume: (cm) 0.025 Character of Wound/Ulcer Post Debridement: Stable Severity of Tissue Post Debridement: Fat layer exposed Post Procedure Diagnosis Same as Pre-procedure Electronic Signature(s) Signed: 11/06/2023 12:29:37 PM By: Bethena Andre PA-C Signed: 11/06/2023 12:54:59 PM By: Lawrence Blossom MSN RN CNS WTA Entered By: Bethena Andre on 11/06/2023 12:29:37 -------------------------------------------------------------------------------- HPI Details Patient Name: Date of Service: [name], [name] 11/06/2023 9:45 A CHRISTELLA Lawrence [name] ([id number]) [id number] Page 5 of 12 Medical Record Number: [id number] Patient Account Number: [id number] Date of Birth/Sex: Treating RN: [birth year] (73 y.o. 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 At that time it was stated that he had essentially unchanged study from his prior Mar 19, 2022 study. At that point we were able to get him healed. HPI Description: Readmission: 10-22-2023 upon evaluation today patient appears for reevaluation here in the clinic concerning issues he is having wounds over the bilateral feet. This includes wounds on the right third toe, left fourth toe, left great toe, and left lateral foot. The lateral foot location is a surgery spot where Dr. Janit removed a portion of bone to try to take pressure off of the area unfortunately there is a little  bit of breakdown here this does not appear to be too deep however. Patient's past medical history really has not shifted since I last saw him earlier in the year and he does have a history of sleep apnea, chronic kidney disease stage IIIa, peripheral vascular disease, hypertension, coronary artery disease, and diabetes mellitus type 1. 11-06-23 upon evaluation today patient appears to be doing somewhat poorly in regard to his toes everything is extremely dry and appears to be overly callused. Again this is something that I worked on quite extensively a couple weeks ago and now has worsened to this point again. Fortunately I do not see any signs of active infection at this time which is great news. Nonetheless I do think that there is some evidence here potential for osteomyelitis. Electronic Signature(s) Signed: 11/06/2023 12:23:19 PM By: Bethena Ferraris PA-C Entered By: Bethena Ferraris on 11/06/2023 12:23:19 -------------------------------------------------------------------------------- Physical Exam Details Patient Name: Date of Service: [name], [name] 11/06/2023 9:45 A M Medical Record Number: [id number] Patient Account Number: [id number] Date of Birth/Sex: Treating RN: 1951-08-02 (73 y.o. NETTY Lawrence [name] Primary Care Provider: Gretta Crank Other Clinician: Referring Provider: Treating Provider/Extender: Bethena Ferraris Gretta Crank Devra in Treatment: 2 Constitutional Well-nourished and well-hydrated in no acute distress. Respiratory normal breathing without difficulty. Psychiatric this patient is able to make decisions and demonstrates good insight into disease process. Alert and Oriented x 3. pleasant and cooperative. Notes Patient's wound currently did require sharp debridement at all locations. The left lateral foot appears to be doing quite well the left great toe, left fourth toe, and right second toe all required debridement and I was able to clear away a lot of necrotic debris on  the fourth toe particular this was actually a bone debridement removing pieces of necrotic bone that I sent for both pathology and culture to see what this shows pending the results we may have to make adjustments in care as needed. Electronic Signature(s) Signed: 11/06/2023 12:23:55 PM By: Bethena Ferraris PA-C Entered By: Bethena Ferraris on 11/06/2023 12:23:55 Hamric, GERRE [name] ([id number]) [id number] Page 6 of 12 -------------------------------------------------------------------------------- Physician Orders Details Patient Name: Date of Service: [name], [name] 11/06/2023 9:45 A M Medical Record Number: [id number] Patient Account Number: [id number] Date of Birth/Sex: Treating RN: [birth year] (73 y.o. NETTY Lawrence [name] Primary Care Provider: Gretta Crank Other Clinician: Referring Provider: Treating Provider/Extender: Bethena Ferraris Gretta Crank Devra in Treatment: 2 The following information was scribed by: Lawrence [name] The information was scribed for: Bethena Ferraris Verbal / Phone Orders: No Diagnosis Coding Follow-up Appointments Return Appointment in 1 week. Bathing/ Shower/ Hygiene Clean wound with Normal Saline or wound cleanser. Wash wounds with antibacterial soap and water. Anesthetic (Use 'Patient Medications' Section for Anesthetic Order Entry) Lidocaine  applied to wound bed Wound Treatment Wound #5 - T Third oe Wound Laterality: Right  Cleanser: Byram Ancillary Kit - 15 Day Supply (Generic) 3 x Per Week/30 Days Discharge Instructions: Use supplies as instructed; Kit contains: (15) Saline Bullets; (15) 3x3 Gauze; 15 pr Gloves Cleanser: Soap and Water 3 x Per Week/30 Days Discharge Instructions: Gently cleanse wound with antibacterial soap, rinse and pat dry prior to dressing wounds Cleanser: Vashe 5.8 (oz) 3 x Per Week/30 Days Discharge Instructions: Use vashe 5.8 (oz) as directed Topical: Gentamicin  3 x Per Week/30 Days Discharge Instructions:  Apply as directed by provider. Prim Dressing: Xeroform 4x4-HBD (in/in) 3 x Per Week/30 Days ary Discharge Instructions: Apply Xeroform 4x4-HBD (in/in) as directed Secondary Dressing: Gauze (Generic) 3 x Per Week/30 Days Discharge Instructions: As directed: dry, moistened with saline or moistened with Dakins Solution Secured With: Medipore T - 27M Medipore H Soft Cloth Surgical T ape ape, 2x2 (in/yd) (Generic) 3 x Per Week/30 Days Secured With: [employer] Sterile or Non-Sterile 6-ply 4.5x4 (yd/yd) (Generic) 3 x Per Week/30 Days Discharge Instructions: Apply Kerlix as directed Wound #6 - T Fourth oe Wound Laterality: Left Cleanser: Byram Ancillary Kit - 15 Day Supply (Generic) 3 x Per Week/30 Days Discharge Instructions: Use supplies as instructed; Kit contains: (15) Saline Bullets; (15) 3x3 Gauze; 15 pr Gloves Cleanser: Soap and Water 3 x Per Week/30 Days Discharge Instructions: Gently cleanse wound with antibacterial soap, rinse and pat dry prior to dressing wounds Cleanser: Vashe 5.8 (oz) 3 x Per Week/30 Days Discharge Instructions: Use vashe 5.8 (oz) as directed Topical: Gentamicin  3 x Per Week/30 Days Discharge Instructions: Apply as directed by provider. Prim Dressing: Xeroform 4x4-HBD (in/in) 3 x Per Week/30 Days ary Discharge Instructions: Apply Xeroform 4x4-HBD (in/in) as directed Lawrence [name] ([id number]) [phone].pdf Page 7 of 12 Secondary Dressing: Gauze (Generic) 3 x Per Week/30 Days Discharge Instructions: As directed: dry, moistened with saline or moistened with Dakins Solution Secured With: Medipore T - 27M Medipore H Soft Cloth Surgical T ape ape, 2x2 (in/yd) (Generic) 3 x Per Week/30 Days Secured With: [employer] or Non-Sterile 6-ply 4.5x4 (yd/yd) (Generic) 3 x Per Week/30 Days Discharge Instructions: Apply Kerlix as directed Wound #7 - T Great oe Wound Laterality: Left Cleanser: Byram Ancillary Kit - 15 Day Supply (Generic) 3 x  Per Week/30 Days Discharge Instructions: Use supplies as instructed; Kit contains: (15) Saline Bullets; (15) 3x3 Gauze; 15 pr Gloves Cleanser: Soap and Water 3 x Per Week/30 Days Discharge Instructions: Gently cleanse wound with antibacterial soap, rinse and pat dry prior to dressing wounds Cleanser: Vashe 5.8 (oz) 3 x Per Week/30 Days Discharge Instructions: Use vashe 5.8 (oz) as directed Topical: Gentamicin  3 x Per Week/30 Days Discharge Instructions: Apply as directed by provider. Prim Dressing: Xeroform 4x4-HBD (in/in) 3 x Per Week/30 Days ary Discharge Instructions: Apply Xeroform 4x4-HBD (in/in) as directed Secondary Dressing: Gauze (Generic) 3 x Per Week/30 Days Discharge Instructions: As directed: dry, moistened with saline or moistened with Dakins Solution Secured With: Medipore T - 27M Medipore H Soft Cloth Surgical T ape ape, 2x2 (in/yd) (Generic) 3 x Per Week/30 Days Secured With: [employer] Sterile or Non-Sterile 6-ply 4.5x4 (yd/yd) (Generic) 3 x Per Week/30 Days Discharge Instructions: Apply Kerlix as directed Wound #8 - Foot Wound Laterality: Left, Lateral Cleanser: Byram Ancillary Kit - 15 Day Supply (Generic) 3 x Per Week/30 Days Discharge Instructions: Use supplies as instructed; Kit contains: (15) Saline Bullets; (15) 3x3 Gauze; 15 pr Gloves Cleanser: Soap and Water 3 x Per Week/30 Days Discharge Instructions: Gently cleanse wound with antibacterial  soap, rinse and pat dry prior to dressing wounds Cleanser: Vashe 5.8 (oz) 3 x Per Week/30 Days Discharge Instructions: Use vashe 5.8 (oz) as directed Topical: Gentamicin  3 x Per Week/30 Days Discharge Instructions: Apply as directed by provider. Prim Dressing: Xeroform 4x4-HBD (in/in) 3 x Per Week/30 Days ary Discharge Instructions: Apply Xeroform 4x4-HBD (in/in) as directed Secondary Dressing: Gauze (Generic) 3 x Per Week/30 Days Discharge Instructions: As directed: dry, moistened with saline or moistened with Dakins  Solution Secured With: Medipore T - 21M Medipore H Soft Cloth Surgical T ape ape, 2x2 (in/yd) (Generic) 3 x Per Week/30 Days Secured With: Kerlix Roll Sterile or Non-Sterile 6-ply 4.5x4 (yd/yd) (Generic) 3 x Per Week/30 Days Discharge Instructions: Apply Kerlix as directed Laboratory Bacteria identified in Wound by Culture (MICRO) LOINC Code: [id number] Convenience Name: Wound culture routine Bacteria identified in Tissue by Biopsy culture (MICRO) - Bone biopsy LOINC Code: 79525-6 Convenience Name: Biopsy specimen culture Electronic Signature(s) Signed: 11/06/2023 12:49:39 PM By: Bethena Ferraris PA-C Signed: 11/06/2023 12:54:59 PM By: Lawrence Blossom MSN RN CNS WTA Entered By: Lawrence [name] on 11/06/2023 12:32:17 Jindra, GERRE [name] ([id number]) [id number] Page 8 of 12 -------------------------------------------------------------------------------- Problem List Details Patient Name: Date of Service: [name], [name] 11/06/2023 9:45 A M Medical Record Number: [id number] Patient Account Number: [id number] Date of Birth/Sex: Treating RN: [birth year] (73 y.o. NETTY Lawrence [name] Primary Care Provider: Gretta Crank Other Clinician: Referring Provider: Treating Provider/Extender: Bethena Ferraris Gretta Crank Devra in Treatment: 2 Active Problems ICD-10 Encounter Code Description Active Date MDM Diagnosis E10.621 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 10/22/2023 No Yes L97.522 Non-pressure chronic ulcer of other part of left foot with fat layer exposed 10/22/2023 No Yes L97.512 Non-pressure chronic ulcer of other part of right foot with fat layer exposed 10/22/2023 No Yes I25.10 Atherosclerotic heart disease of native coronary artery without angina pectoris 10/22/2023 No Yes I10 Essential (primary) hypertension 10/22/2023 No Yes I73.89 Other specified peripheral vascular diseases 10/22/2023 No Yes N18.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, stage 3a 10/22/2023 No Yes G47.30 Sleep  apnea, unspecified 10/22/2023 No Yes Inactive Problems Resolved Problems Electronic Signature(s) Signed: 11/06/2023 11:16:40 AM By: Bethena Ferraris PA-C Entered By: Bethena Ferraris on 11/06/2023 11:16:40 Grunow, GERRE [name] ([id number]) [id number] Page 9 of 12 -------------------------------------------------------------------------------- Progress Note Details Patient Name: Date of Service: [name], [name] 11/06/2023 9:45 A M Medical Record Number: [id number] Patient Account Number: [id number] Date of Birth/Sex: Treating RN: 1951/05/04 (73 y.o. 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 At that time it was stated that he had essentially unchanged study from his prior Mar 19, 2022 study. At that point we were able to get him healed. Readmission: 10-22-2023 upon evaluation today patient appears for reevaluation here in the clinic concerning issues he is having wounds over the bilateral feet. This includes wounds on the right third toe, left fourth toe, left great toe, and left lateral foot. The lateral foot location is a surgery spot  where Dr. Janit removed a portion of bone to try to take pressure off of the area unfortunately there is a little bit of breakdown here this does not appear to be too deep however. Patient's past medical history really has not shifted since I  last saw him earlier in the year and he does have a history of sleep apnea, chronic kidney disease stage IIIa, peripheral vascular disease, hypertension, coronary artery disease, and diabetes mellitus type 1. 11-06-23 upon evaluation today patient appears to be doing somewhat poorly in regard to his toes everything is extremely dry and appears to be overly callused. Again this is something that I worked on quite extensively a couple weeks ago and now has worsened to this point again. Fortunately I do not see any signs of active infection at this time which is great news. Nonetheless I do think that there is some evidence here potential for osteomyelitis. Objective Constitutional Well-nourished and well-hydrated in no acute distress. Vitals Time Taken: 9:44 AM, Height: 70 in, Weight: 290 lbs, BMI: 41.6, Temperature: 97.6 F, Pulse: 66 bpm, Respiratory Rate: 18 breaths/min, Blood Pressure: 141/60 mmHg. Respiratory normal breathing without difficulty. Psychiatric this patient is able to make decisions and demonstrates good insight into disease process. Alert and Oriented x 3. pleasant and cooperative. General Notes: Patient's wound currently did require sharp debridement at all locations. The left lateral foot appears to be doing quite well the left great toe, left fourth toe, and right second toe all required debridement and I was able to clear away a lot of necrotic debris on the fourth toe particular this was actually a bone debridement removing pieces of necrotic bone that I sent for both pathology and culture to see what this shows pending the results we may have to make adjustments in care as needed. Integumentary (Hair, Skin) Wound #5 status is Open. Original cause of wound was Gradually Appeared. The date acquired was: 09/30/2023. The wound has been in treatment 2 weeks. The wound is located on the Right T Third. The wound measures 0.5cm length x 0.7cm width x 0.1cm depth; 0.275cm^2 area and  0.027cm^3 volume. There is Fat oe Layer (Subcutaneous Tissue) exposed. There is a medium amount of serosanguineous drainage noted. There is medium (34-66%) pink granulation within the wound bed. There is a medium (34-66%) amount of necrotic tissue within the wound bed including Adherent Slough. Wound #6 status is Open. Original cause of wound was Gradually Appeared. The date acquired was: 09/30/2023. The wound has been in treatment 2 weeks. The wound is located on the Left T Fourth. The wound measures 0.5cm length x 0.5cm width x 0.12cm depth; 0.196cm^2 area and 0.024cm^3 volume. There is a oe medium amount of serosanguineous drainage noted. There is medium (34-66%) red granulation within the wound bed. There is a medium (34-66%) amount of SAVIER, TRICKETT ([id number]) [phone].pdf Page 10 of 12 necrotic tissue within the wound bed. Wound #7 status is Open. Original cause of wound was Gradually Appeared. The date acquired was: 09/30/2023. The wound has been in treatment 2 weeks. The wound is located on the Left T Great. The wound measures 1.3cm length x 1.5cm width x 0.1cm depth; 1.532cm^2 area and 0.153cm^3 volume. There is a oe medium amount of serosanguineous drainage noted. There is no granulation within the wound bed. There is a medium (34-66%) amount of necrotic tissue within the wound bed including Eschar. Wound #8 status is Open. Original cause of wound was Gradually Appeared. The date acquired was: 09/30/2023.  The wound has been in treatment 2 weeks. The wound is located on the Left,Lateral Foot. The wound measures 0.4cm length x 0.4cm width x 0.2cm depth; 0.126cm^2 area and 0.025cm^3 volume. There is Fat Layer (Subcutaneous Tissue) exposed. There is a medium amount of serosanguineous drainage noted. There is no granulation within the wound bed. There is a medium (34-66%) amount of necrotic tissue within the wound bed including Adherent Slough. 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 There was a Excisional Skin/Subcutaneous Tissue Debridement with a total area of 0.27 sq cm performed by Bethena Ferraris, PA-C. With the following instrument(s): Curette to remove Viable and Non-Viable tissue/material. Material removed includes Callus and Subcutaneous Tissue and after achieving pain control using Lidocaine  4% Topical Solution. No specimens were taken. A time out was conducted at 10:34, prior to the start of the procedure. A Minimum amount of bleeding was controlled with Pressure. The procedure was tolerated well. Post Debridement Measurements: 0.5cm length x 0.7cm width x 0.1cm depth; 0.027cm^3 volume. Character of Wound/Ulcer Post Debridement is stable. Severity of Tissue Post Debridement is: Fat layer exposed. Post procedure Diagnosis Wound #5: Same as Pre-Procedure Wound #6 Pre-procedure diagnosis of Wound #6 is a Diabetic Wound/Ulcer of the Lower Extremity located on the Left T Fourth .Severity of Tissue Pre Debridement is: oe Fat layer exposed. There was a Excisional Skin/Subcutaneous Tissue/Muscle/Bone Debridement with a total area of 0.2 sq cm performed by Bethena Ferraris, PA- C. With the following instrument(s): Curette to remove Viable and Non-Viable tissue/material. Material removed includes Bone,Callus, Subcutaneous Tissue, and Slough after achieving pain  control using Lidocaine  4% T opical Solution. 1 specimen was taken by a Swab and sent to the lab per facility protocol. A time out was conducted at 10:34, prior to the start of the procedure. A Minimum amount of bleeding was controlled with Silver Nitrate. The procedure was tolerated well with a pain level of 0 throughout and a pain level of 0 following the procedure. Post Debridement Measurements: 0.5cm length x 0.5cm width x 0.1cm depth; 0.02cm^3 volume. Character of Wound/Ulcer Post Debridement is stable. Severity of Tissue Post Debridement is: Fat layer exposed. Post procedure Diagnosis Wound #6: Same as Pre-Procedure Wound #7 Pre-procedure diagnosis of Wound #7 is a Diabetic Wound/Ulcer of the Lower Extremity located on the Left T Great .Severity of Tissue Pre Debridement is: oe Fat layer exposed. There was a Excisional Skin/Subcutaneous Tissue Debridement with a total area of 1.53 sq cm performed by Bethena Ferraris, PA-C. With the following instrument(s): Curette to remove Viable and Non-Viable tissue/material. Material removed includes Callus, Subcutaneous Tissue, Skin: Dermis, and Skin: Epidermis after achieving pain control using Lidocaine  4% Topical Solution. No specimens were taken. A time out was conducted at 10:34, prior to the start of the procedure. A Minimum amount of bleeding was controlled with Pressure. The procedure was tolerated well with a pain level of 0 throughout and a pain level of 0 following the procedure. Post Debridement Measurements: 1.3cm length x  1.5cm width x 0.1cm depth; 0.153cm^3 volume. Character of Wound/Ulcer Post Debridement is stable. Severity of Tissue Post Debridement is: Fat layer exposed. Post procedure Diagnosis Wound #7: Same as Pre-Procedure Wound #8 Pre-procedure diagnosis of Wound #8 is a Diabetic Wound/Ulcer of the Lower Extremity located on the Left,Lateral Foot .Severity of Tissue Pre Debridement is: Fat layer exposed. There was a Excisional  Skin/Subcutaneous Tissue Debridement with a total area of 0.13 sq cm performed by Bethena Ferraris, PA-C. With the following instrument(s): Curette to remove Viable and Non-Viable tissue/material. Material removed includes Callus, Subcutaneous Tissue, and Slough after achieving pain control using Lidocaine  4% T opical Solution. No specimens were taken. A time out was conducted at 10:34, prior to the start of the procedure. A Minimum amount of bleeding was controlled with Pressure. The procedure was tolerated well with a pain level of 0 throughout and a pain level of 0 following the procedure. Post Debridement Measurements: 0.4cm length x 0.4cm width x 0.2cm depth; 0.025cm^3 volume. Character of Wound/Ulcer Post Debridement is stable. Severity of Tissue Post Debridement is: Fat layer exposed. Post procedure Diagnosis Wound #8: Same as Pre-Procedure Plan Follow-up Appointments: WAYDE, GOPAUL ([id number]) [phone].pdf Page 11 of 12 Return Appointment in 1 week. Bathing/ Shower/ Hygiene: Clean wound with Normal Saline or wound cleanser. Wash wounds with antibacterial soap and water. Anesthetic (Use 'Patient Medications' Section for Anesthetic Order Entry): Lidocaine  applied to wound bed WOUND #5: - T Third Wound Laterality: Right oe Cleanser: Byram Ancillary Kit - 15 Day Supply (Generic) 3 x Per Week/30 Days Discharge Instructions: Use supplies as instructed; Kit contains: (15) Saline Bullets; (15) 3x3 Gauze; 15 pr Gloves Cleanser: Soap and Water 3 x Per Week/30 Days Discharge Instructions: Gently cleanse wound with antibacterial soap, rinse and pat dry prior to dressing wounds Cleanser: Vashe 5.8 (oz) 3 x Per Week/30 Days Discharge Instructions: Use vashe 5.8 (oz) as directed Topical: Gentamicin  3 x Per Week/30 Days Discharge Instructions: Apply as directed by provider. Prim Dressing: Xeroform 4x4-HBD (in/in) 3 x Per Week/30 Days ary Discharge Instructions: Apply  Xeroform 4x4-HBD (in/in) as directed Secondary Dressing: Gauze (Generic) 3 x Per Week/30 Days Discharge Instructions: As directed: dry, moistened with saline or moistened with Dakins Solution Secured With: Medipore T - 86M Medipore H Soft Cloth Surgical T ape ape, 2x2 (in/yd) (Generic) 3 x Per Week/30 Days Secured With: [employer] Sterile or Non-Sterile 6-ply 4.5x4 (yd/yd) (Generic) 3 x Per Week/30 Days Discharge Instructions: Apply Kerlix as directed WOUND #6: - T Fourth Wound Laterality: Left oe Cleanser: Byram Ancillary Kit - 15 Day Supply (Generic) 3 x Per Week/30 Days Discharge Instructions: Use supplies as instructed; Kit contains: (15) Saline Bullets; (15) 3x3 Gauze; 15 pr Gloves Cleanser: Soap and Water 3 x Per Week/30 Days Discharge Instructions: Gently cleanse wound with antibacterial soap, rinse and pat dry prior to dressing wounds Cleanser: Vashe 5.8 (oz) 3 x Per Week/30 Days Discharge Instructions: Use vashe 5.8 (oz) as directed Topical: Gentamicin  3 x Per Week/30 Days Discharge Instructions: Apply as directed by provider. Prim Dressing: Xeroform 4x4-HBD (in/in) 3 x Per Week/30 Days ary Discharge Instructions: Apply Xeroform 4x4-HBD (in/in) as directed Secondary Dressing: Gauze (Generic) 3 x Per Week/30 Days Discharge Instructions: As directed: dry, moistened with saline or moistened with Dakins Solution Secured With: Medipore T - 86M Medipore H Soft Cloth Surgical T ape ape, 2x2 (in/yd) (Generic) 3 x Per Week/30 Days Secured With: [employer] Sterile or Non-Sterile 6-ply 4.5x4 (yd/yd) (Generic) 3 x Per Week/30  Days Discharge Instructions: Apply Kerlix as directed WOUND #7: - T Great Wound Laterality: Left oe Cleanser: Byram Ancillary Kit - 15 Day Supply (Generic) 3 x Per Week/30 Days Discharge Instructions: Use supplies as instructed; Kit contains: (15) Saline Bullets; (15) 3x3 Gauze; 15 pr Gloves Cleanser: Soap and Water 3 x Per Week/30 Days Discharge Instructions: Gently  cleanse wound with antibacterial soap, rinse and pat dry prior to dressing wounds Cleanser: Vashe 5.8 (oz) 3 x Per Week/30 Days Discharge Instructions: Use vashe 5.8 (oz) as directed Topical: Gentamicin  3 x Per Week/30 Days Discharge Instructions: Apply as directed by provider. Prim Dressing: Xeroform 4x4-HBD (in/in) 3 x Per Week/30 Days ary Discharge Instructions: Apply Xeroform 4x4-HBD (in/in) as directed Secondary Dressing: Gauze (Generic) 3 x Per Week/30 Days Discharge Instructions: As directed: dry, moistened with saline or moistened with Dakins Solution Secured With: Medipore T - 78M Medipore H Soft Cloth Surgical T ape ape, 2x2 (in/yd) (Generic) 3 x Per Week/30 Days Secured With: [employer] Sterile or Non-Sterile 6-ply 4.5x4 (yd/yd) (Generic) 3 x Per Week/30 Days Discharge Instructions: Apply Kerlix as directed WOUND #8: - Foot Wound Laterality: Left, Lateral Cleanser: Byram Ancillary Kit - 15 Day Supply (Generic) 3 x Per Week/30 Days Discharge Instructions: Use supplies as instructed; Kit contains: (15) Saline Bullets; (15) 3x3 Gauze; 15 pr Gloves Cleanser: Soap and Water 3 x Per Week/30 Days Discharge Instructions: Gently cleanse wound with antibacterial soap, rinse and pat dry prior to dressing wounds Cleanser: Vashe 5.8 (oz) 3 x Per Week/30 Days Discharge Instructions: Use vashe 5.8 (oz) as directed Topical: Gentamicin  3 x Per Week/30 Days Discharge Instructions: Apply as directed by provider. Prim Dressing: Xeroform 4x4-HBD (in/in) 3 x Per Week/30 Days ary Discharge Instructions: Apply Xeroform 4x4-HBD (in/in) as directed Secondary Dressing: Gauze (Generic) 3 x Per Week/30 Days Discharge Instructions: As directed: dry, moistened with saline or moistened with Dakins Solution Secured With: Medipore T - 78M Medipore H Soft Cloth Surgical T ape ape, 2x2 (in/yd) (Generic) 3 x Per Week/30 Days Secured With: [employer] Sterile or Non-Sterile 6-ply 4.5x4 (yd/yd) (Generic) 3 x Per  Week/30 Days Discharge Instructions: Apply Kerlix as directed 1. I am going to recommend based on what I am seeing that we have the patient go ahead and continue to utilize gentamicin  cream going to be doing this topically. 2. I am good recommend as well switching to Xeroform gauze dressings as I feel like this may actually be better for him at this point. 3. I am also going to recommend that he should continue to monitor for any signs of infection and if anything changes he knows to contact the office and let me know. With that being said I am awaiting the results of the PCR culture as well as the pathology to see whether or not we are dealing with osteomyelitis situation I suspect we are possibly on multiple toes but especially on the left fourth toe and great toe. We will see patient back for reevaluation in 1 week here in the clinic. If anything worsens or changes patient will contact our office for additional recommendations. Electronic Signature(s) Signed: 11/06/2023 12:29:46 PM By: Bethena Ferraris PA-C Previous Signature: 11/06/2023 12:29:12 PM Version By: Bethena Ferraris PA-C Previous Signature: 11/06/2023 12:24:52 PM Version By: Bethena Ferraris PA-C Entered By: Bethena Ferraris on 11/06/2023 12:29:46 Ghanem, GERRE [name] ([id number]) [id number] Page 12 of 12 -------------------------------------------------------------------------------- SuperBill Details Patient Name: Date of Service: [name], [name] 11/06/2023 Medical Record Number: [id number] Patient  Account Number: [id number] Date of Birth/Sex: Treating RN: 1951-09-14 (73 y.o. 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 L97.522 Non-pressure chronic ulcer of other part of left foot with fat layer  exposed L97.512 Non-pressure chronic ulcer of other part of right foot with fat layer exposed I25.10 Atherosclerotic heart disease of native coronary artery without angina pectoris I10 Essential (primary) hypertension I73.89 Other specified peripheral vascular diseases N18.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, stage 3a G47.30 Sleep apnea, unspecified Facility Procedures : CPT4 Code: 63899987 Description: 11042 - DEB SUBQ TISSUE 20 SQ CM/< ICD-10 Diagnosis Description 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 Modifier: Quantity: 1 : CPT4 Code: 63899983 Description: 11044 - DEB BONE 20 SQ CM/< ICD-10 Diagnosis Description L97.522 Non-pressure chronic ulcer of other part of left foot with fat layer exposed Modifier: Quantity: 1 Physician Procedures : CPT4: Description Modifier Code 88957 11042 - WC PHYS SUBQ TISS 20 SQ CM ICD-10 Diagnosis Description L97.522 Non-pressure chronic ulcer of other part of left foot with fat layer exposed L97.512 Non-pressure chronic ulcer of other part of right foot with  fat layer exposed Quantity: 1 : CPT4: 3229179 Debridement; bone (includes epidermis, dermis, subQ tissue, muscle and/or fascia, if performed) 1st 20 sqcm or less ICD-10 Diagnosis Description L97.522 Non-pressure chronic ulcer of other part of left foot with fat layer exposed Quantity: 1 Electronic Signature(s) Signed: 11/06/2023 12:30:01 PM By: Bethena Andre PA-C Entered By: Bethena Andre on 11/06/2023 12:30:01

## 2023-11-10 ENCOUNTER — Telehealth: Payer: Self-pay | Admitting: Primary Care

## 2023-11-10 NOTE — Telephone Encounter (Signed)
Placed in [place] inbox for review and completion

## 2023-11-10 NOTE — Telephone Encounter (Signed)
 Completed and placed in Kelli's inbox.

## 2023-11-10 NOTE — Telephone Encounter (Signed)
 [facility] needs a form completed prior to surgery  placed in box up front

## 2023-11-10 NOTE — Telephone Encounter (Signed)
 Spoke with patient advised form has been completed and he requested to come pick up copy. Placed up front with reception.

## 2023-11-13 ENCOUNTER — Encounter: Payer: Self-pay | Admitting: Podiatry

## 2023-11-13 ENCOUNTER — Ambulatory Visit: Payer: Medicare Other | Admitting: Physician Assistant

## 2023-11-14 LAB — COLOGUARD: COLOGUARD: NEGATIVE

## 2023-11-17 ENCOUNTER — Other Ambulatory Visit: Payer: Self-pay | Admitting: Podiatry

## 2023-11-17 ENCOUNTER — Ambulatory Visit: Payer: Medicare Other | Admitting: Physician Assistant

## 2023-11-17 ENCOUNTER — Telehealth: Payer: Self-pay | Admitting: Podiatry

## 2023-11-17 MED ORDER — CIPROFLOXACIN HCL 500 MG PO TABS: 500.0000 mg | ORAL_TABLET | Freq: Two times a day (BID) | ORAL | 0 refills | Status: DC

## 2023-11-17 NOTE — Telephone Encounter (Signed)
 Pt called and is having surgery on 1/23 and he is needing to know if he wanting to know if you wanted him to stay on the antibiotic up until his surgery? If so he will be out tomorrow and will need a refill sent in. His pharmacy is correct in chart.

## 2023-11-24 ENCOUNTER — Encounter: Payer: Self-pay | Admitting: Primary Care

## 2023-11-25 ENCOUNTER — Encounter: Payer: Self-pay | Admitting: Podiatry

## 2023-11-27 ENCOUNTER — Other Ambulatory Visit: Payer: Self-pay | Admitting: Podiatry

## 2023-11-27 ENCOUNTER — Telehealth: Payer: Self-pay

## 2023-11-27 MED ORDER — CIPROFLOXACIN HCL 500 MG PO TABS: 500.0000 mg | ORAL_TABLET | Freq: Two times a day (BID) | ORAL | 0 refills | Status: AC

## 2023-11-27 NOTE — Telephone Encounter (Signed)
Patient called and left a message - surgery is scheduled 12/04/23 - he will run out of antibiotics on Sunday -he will need a refill if you still want him on antibiotics up to the day of surgery -thanks

## 2023-11-30 ENCOUNTER — Other Ambulatory Visit: Payer: Medicare Other

## 2023-11-30 ENCOUNTER — Inpatient Hospital Stay: Admission: RE | Admit: 2023-11-30 | Payer: Medicare Other | Source: Ambulatory Visit

## 2023-12-01 ENCOUNTER — Encounter
Admission: RE | Admit: 2023-12-01 | Discharge: 2023-12-01 | Disposition: A | Discharge status: Home / Self Care | Payer: Medicare Other | Source: Ambulatory Visit | Attending: Podiatry | Admitting: Podiatry

## 2023-12-01 ENCOUNTER — Other Ambulatory Visit: Payer: Self-pay

## 2023-12-01 VITALS — Ht 70.0 in | Wt 295.0 lb

## 2023-12-01 DIAGNOSIS — E1065 Type 1 diabetes mellitus with hyperglycemia: Secondary | ICD-10-CM

## 2023-12-01 HISTORY — DX: Myringotomy tube(s) status: Z96.22

## 2023-12-01 NOTE — Patient Instructions (Addendum)
Your procedure is scheduled on: Friday 12/04/23 To find out your arrival time, please call [phone] between 1PM - 3PM on:   Thursday 12/03/23 Report to the Registration Desk on the 1st floor of the [facility]. Free Valet parking is available.   If your arrival time is 6:00 am, do not arrive before that time as the [facility] entrance doors do not open until 6:00 am.   REMEMBER: Instructions that are not followed completely may result in serious medical risk, up to and including death; or upon the discretion of your surgeon and anesthesiologist your surgery may need to be rescheduled.   Do not eat food or drink any liquids  after midnight the night before surgery.  No gum chewing or hard candies.   One week prior to surgery: Stop Anti-inflammatories (NSAIDS) such as Advil, Aleve, Ibuprofen, Motrin, Naproxen, Naprosyn and Aspirin based products such as Excedrin, Goody's Powder, BC Powder. You may however, continue to take Tylenol if needed for pain up until the day of surgery.   Stop ANY OVER THE COUNTER supplements and vitamins Today 12/01/23 until after surgery.   Continue taking all prescribed medications with the exception of the following: Per Dr Logan Bores hold your blood thinners Plavix(clopidigrel) and Aspirin for 5 days.  GLARNINE insulin, decrease your nighttime dose from 20 units to 10 units Thursday night 12/03/23   TAKE ONLY THESE MEDICATIONS THE MORNING OF SURGERY WITH A SIP OF WATER:   carvedilol (COREG) 12.5 MG tablet  famotidine (PEPCID) 20 MG tablet Antacid (take one the night before and one on the morning of surgery - helps to prevent nausea after surgery.) isosorbide mononitrate (IMDUR) 60 MG 24 hr tablet    Use inhalers on the day of surgery and bring to the hospital. You can also use your nasal spray if needed.   No Alcohol for 24 hours before or after surgery.   No Smoking including e-cigarettes for 24 hours before surgery.  No chewable tobacco products for at  least 6 hours before surgery.  No nicotine patches on the day of surgery.   Do not use any "recreational" drugs for at least a week (preferably 2 weeks) before your surgery.  Please be advised that the combination of cocaine and anesthesia may have negative outcomes, up to and including death. If you test positive for cocaine, your surgery will be cancelled.   On the morning of surgery brush your teeth with toothpaste and water, you may rinse your mouth with mouthwash if you wish. Do not swallow any toothpaste or mouthwash.   Use CHG Soap or wipes as directed on instruction sheet.   Do not wear lotions, powders, or perfumes.    Do not shave body hair from the neck down 48 hours before surgery.   Wear comfortable clothing (specific to your surgery type) to the hospital.   Do not wear jewelry, make-up, hairpins, clips or nail polish.   For welded (permanent) jewelry: bracelets, anklets, waist bands, etc.  Please have this removed prior to surgery.  If it is not removed, there is a chance that hospital personnel will need to cut it off on the day of surgery. Contact lenses, hearing aids and dentures may not be worn into surgery.   Bring your C-PAP to the hospital in case you may have to spend the night.    Do not bring valuables to the hospital. [facility] is not responsible for any missing/lost belongings or valuables.    Notify your doctor  if there is any change in your medical condition (cold, fever, infection).   If you are being discharged the day of surgery, you will not be allowed to drive home. You will need a responsible individual to drive you home and stay with you for 24 hours after surgery.    If you are taking public transportation, you will need to have a responsible individual with you.   If you are being admitted to the hospital overnight, leave your suitcase in the car. After surgery it may be brought to your room.   In case of increased patient census, it may be  necessary for you, the patient, to continue your postoperative care in the Same Day Surgery department.   After surgery, you can help prevent lung complications by doing breathing exercises.  Take deep breaths and cough every 1-2 hours. Your doctor may order a device called an Incentive Spirometer to help you take deep breaths. When coughing or sneezing, hold a pillow firmly against your incision with both hands. This is called "splinting." Doing this helps protect your incision. It also decreases belly discomfort.   Surgery Visitation Policy:   Patients undergoing a surgery or procedure may have two family members or support persons with them as long as the person is not COVID-19 positive or experiencing its symptoms.    Inpatient Visitation:     Visiting hours are 7 a.m. to 8 p.m. Up to four visitors are allowed at one time in a patient room. The visitors may rotate out with other people during the day. One designated support person (adult) may remain overnight.   Please call the Pre-admissions Testing Dept. at [phone] if you have any questions about these instructions.       Preparing the Skin Before Surgery     To help prevent the risk of infection at your surgical site, we are now providing you with rinse-free Sage 2% Chlorhexidine Gluconate (CHG) disposable wipes.  Chlorhexidine Gluconate (CHG) Soap  o An antiseptic cleaner that kills germs and bonds with the skin to continue killing germs even after washing  o Used for showering the night before surgery and morning of surgery  The night before surgery: Shower or bathe with warm water. Do not apply perfume, lotions, powders. Wait one hour after shower. Skin should be dry and cool. Open Sage wipe package - use 6 disposable cloths. Wipe body using one cloth for the right arm, one cloth for the left arm, one cloth for the right leg, one cloth for the left leg, one cloth for the chest/abdomen area, and one cloth for the  back. Do not use on open wounds or sores. Do not use on face or genitals (private parts). If you are breast feeding, do not use on breasts. 5. Do not rinse, allow to dry. 6. Skin may feel "tacky" for several minutes. 7. Dress in clean clothes. 8. Place clean sheets on your bed and do not sleep with pets.  REPEAT ABOVE ON THE MORNING OF SURGERY BEFORE ARRIVING TO THE HOSPITAL.

## 2023-12-02 NOTE — Progress Notes (Signed)
Perioperative / Anesthesia Services  Pre-Admission Testing Clinical Review / Pre-Operative Anesthesia Consult  Date: 12/02/23  Patient Demographics:  Name: Lawrence [name] DOB:   June 05, 1951 MRN:   [id number]  Planned Surgical Procedure(s):    Case: [id number] Date/Time: 12/04/23 1615   Procedures:      TRANSMETATARSAL AMPUTATION (Left: Toe)     TENDON-ACHILLES LENGTHENING (Left)     AMPUTATION TOE INTERPHALANGEAL 3RD (Right: Toe)   Anesthesia type: Choice   Pre-op diagnosis: osteomyelitis   Location: [facility] OR ROOM 08 / [facility]   Surgeons: Felecia Shelling, DPM     NOTE: Available PAT nursing documentation and vital signs have been reviewed. Clinical nursing staff has updated patient's PMH/PSHx, current medication list, and drug allergies/intolerances to ensure comprehensive history available to assist in medical decision making as it pertains to the aforementioned surgical procedure and anticipated anesthetic course. Extensive review of available clinical information personally performed. [place] PMH and PSHx updated with any diagnoses/procedures that  may have been inadvertently omitted during his intake with the pre-admission testing department's nursing staff.  Clinical Discussion:  Lawrence [name] is a 73 y.o. male who is submitted for pre-surgical anesthesia review and clearance prior to him undergoing the above procedure. Patient is a Former Smoker (16.7 pack years; quit 07/1986). Pertinent PMH includes: CAD (s/p CABG), NSTEMI, diastolic dysfunction, PAD, angina, PAD, BILATERAL carotid artery disease, HTN, HLD, T2DM, CKD-III, dyspnea, OSAH (requires nocturnal PAP therapy), GERD (on daily H2 blocker), OA.  Patient is followed by cardiology Darrold Junker, MD). He was last seen in the cardiology clinic on 09/08/2023; notes reviewed. At the time of his clinic visit, patient complaining of intermittent episodes of chest discomfort.  Symptom improved with recent  increased episode of long-acting nitrate (isosorbide).  Patient denied any chest pain, shortness of breath, PND, orthopnea, palpitations, significant peripheral edema, weakness, fatigue, vertiginous symptoms, or presyncope/syncope. Patient with a past medical history significant for cardiovascular diagnoses. Documented physical exam was grossly benign, providing no evidence of acute exacerbation and/or decompensation of the patient's known cardiovascular conditions.  Patient suffered an NSTEMI on 07/27/2007.  Subsequent diagnostic LEFT heart catheterization was performed on 07/30/2007 revealing multivessel CAD; 30% mid LAD, 99% D1, 50% proximal LCx, 40% mid LCx, 75% distal LCx-1, 75% distal LCx-2, 85% distal LCx-3, 50% OM1, 75% OM2-1, 80% OM2-2, 80% OM2-3, 50% proximal RCA, 60% mid RCA, and 85% distal RCA.  Interventional cardiology noted that patient with significant RCA disease that was not amenable to PCI.  Intensified medical management was recommended.  Diagnostic LEFT heart catheterization was performed on 06/11/2016 revealing multivessel CAD; 95% distal LAD-1, 95% distal LAD-2, 40% mid LAD, 65% ostial LCx, 90% OM1, 90% OM2, 80% distal LCx, 70% LM, 70% proximal LAD, 65% proximal RCA-1, 95% proximal RCA-2, 45% mid RCA, and 60% distal RCA.  Given the degree and complexity of patient's coronary artery disease, patient was referred to CVTS for consideration of revascularization procedure.  Patient underwent four-vessel revascularization on 06/27/2016.  LIMA-LAD, SVG-RCA, and sequential SVG-OM1-OM2 bypass grafts were placed.  Patient with recurrent anginal symptoms.  He underwent repeat diagnostic LEFT heart catheterization on 01/13/2017 revealing multivessel CAD; 95% distal LAD-1, 95% distal LAD-2, 40% mid LAD, 65% ostial LCx, 90% OM1, 90% OM 2, 80% distal LCx, 70% LM, 70% proximal LAD, 65% proximal RCA-1, 95% proximal RCA-2, 100% mid RCA-1, 45% mid RCA-2, 100% distal RCA-1, and 60% distal RCA-2.  LIMA-LAD  and SVG-OM1 bypass grafts were placed.  SVG-RCA was occluded and  not felt to be amenable to PCI.  Intensified medical management was recommended.  Most recent TTE was performed on 07/22/2019 revealing a normal left ventricular systolic function with an EF of 55 to 60%.  There was mild LVH.  Diastolic Doppler parameters were normal.  There was trivial mitral and mild tricuspid valve regurgitation.  All transvalvular gradients were noted to be normal providing no evidence suggestive of valvular stenosis.  Aorta normal in size with no evidence of aneurysmal dilatation.  Most recent myocardial perfusion imaging study was performed on 05/19/2023 revealing a normal left ventricular systolic function with a hyperdynamic LVEF of 69%.  There was a moderate mixed perfusion abnormality in the anterior lateral and apical regions with borderline evidence of redistribution.  Findings possibly consistent with ischemia with mixed redistribution.  Study determined to be moderate to high risk with equivocal findings for possible ischemia.  Repeat cardiac catheterization was recommended.  Most recent diagnostic LEFT heart catheterization was performed on 06/09/2023 revealing multivessel CAD; 75% mid LM, 80% ostial to proximal LAD, 60% mid LAD, 100% proximal mid LCx, 100% OM1, 95% proximal RCA, 100% mid RCA, and 50% mid to distal LAD.  Again, LIMA-LAD and SVG-OM1 bypass grafts were patent.  There was CTO of the SVG-PDA graft with adequate LEFT to RIGHT collateral formation.  Further intervention was deferred opting for aggressive medical management.  Given patient's significant cardiovascular disease, he remains on daily DAPT therapy using ASA + clopidogrel.  Patient is reportedly compliant with therapy with no evidence or reports of GI/GU related bleeding.  Blood pressure well controlled at 90/60 mmHg on currently prescribed beta-blocker (carvedilol), diuretic (furosemide), and nitrate (isosorbide mononitrate) therapies.  In  addition to his scheduled nitrates, patient has a supply of short acting nitrates (NTG) to use on a as needed basis for recurrent angina/anginal equivalent symptoms; denied recent use.  Patient is on rosuvastatin for his HLD diagnosis and ASCVD prevention. T2DM loosely controlled on currently prescribed regimen; last HgbA1c was 7.6% when checked on 09/01/2023. He does have an OSAH diagnosis and is reported to be compliant with prescribed nocturnal PAP therapy.  Functional capacity limited by age, arthritides, and multiple medical comorbidities.  With that said, patient felt to be able to achieve all of his ADLs/IADLs independently without significant cardiovascular limitation.  Per the DASI, patient is able to achieve at least 4 METS of physical activity without experiencing any significant degree of angina/anginal equivalent symptoms.  No changes were made to his medication regimen during his visit with cardiology.  Patient scheduled to follow-up with outpatient cardiology in 3 months or sooner if needed.  Lawrence [name] is scheduled for an elective TRANSMETATARSAL AMPUTATION (Left: Toe); TENDON-ACHILLES LENGTHENING (Left)' AMPUTATION TOE INTERPHALANGEAL 3RD (Right: Toe) on 12/04/2023 with Dr. Sanjuana Letters, DPM.  Given patient's past medical history significant for cardiovascular diagnoses, presurgical cardiac clearance was sought by the PAT team. Per cardiology, "this patient is optimized for surgery and may proceed with the planned procedural course with a LOW risk of significant perioperative cardiovascular complications".  Again, this patient is on daily DAPT therapy.  Patient has been instructed on recommendations for holding his low dose ASA and clopidogrel for 5 days prior to his procedure with plans to restart as soon as postoperative bleeding respectively minimized by his primary attending surgeon.  He is aware that his last dose of DAPT medications should be on 11/28/2023.  Patient denies previous  perioperative complications with anesthesia in the past. In review of the available records,  it is noted that patient underwent a general anesthetic course here at [facility] ([facility]) in 09/2023 without documented complications.      12/01/2023    2:54 PM 11/02/2023    2:13 PM 10/20/2023    1:05 PM  Vitals with BMI  Height 5\' 10"  5' 10.5" 5' 10.5"  Weight 295 lbs 296 lbs 290 lbs  BMI 42.33 41.86 41.01  Systolic  134 --  Diastolic  88 --  Pulse  70     Providers/Specialists:   NOTE: Primary physician provider listed below. Patient may have been seen by APP or partner within same practice.   PROVIDER ROLE / SPECIALTY LAST Carolanne Grumbling, DPM Podiatry (Surgeon) 11/06/2023  Doreene Nest, NP Primary Care Provider 11/02/2023  Marcina Millard, MD Cardiology 09/08/2023   Allergies:  Latex, Nitroglycerin, Statins, Fluvastatin, Fluvastatin sodium, Rosuvastatin, Simvastatin, Lipitor [atorvastatin], Lisinopril, and Zetia [ezetimibe]  Current Home Medications:   No current facility-administered medications for this encounter.    acetaminophen (TYLENOL) 500 MG tablet   AIMOVIG 140 MG/ML SOAJ   albuterol (VENTOLIN HFA) 108 (90 Base) MCG/ACT inhaler   amoxicillin (AMOXIL) 500 MG tablet   aspirin 81 MG EC tablet   Calcium Carb-Cholecalciferol (CALCIUM + D3 PO)   carvedilol (COREG) 12.5 MG tablet   Cholecalciferol (VITAMIN D) 50 MCG (2000 UT) tablet   clopidogrel (PLAVIX) 75 MG tablet   famotidine (ACID CONTROLLER) 10 MG tablet   fluticasone (FLONASE) 50 MCG/ACT nasal spray   furosemide (LASIX) 40 MG tablet   glucagon 1 MG injection   GLUTOSE 15 40 % GEL   insulin aspart (NOVOLOG) 100 UNIT/ML injection   insulin glargine-yfgn (SEMGLEE) 100 UNIT/ML injection   isosorbide mononitrate (IMDUR) 60 MG 24 hr tablet   loratadine (CLARITIN) 10 MG tablet   nitroGLYCERIN (NITROSTAT) 0.4 MG SL tablet   potassium chloride SA (K-DUR,KLOR-CON) 20 MEQ  tablet   rosuvastatin (CRESTOR) 20 MG tablet   Travoprost, BAK Free, (TRAVATAN) 0.004 % SOLN ophthalmic solution   vitamin C (ASCORBIC ACID) 500 MG tablet   ciprofloxacin (CIPRO) 500 MG tablet   History:   Past Medical History:  Diagnosis Date   Allergies    Anginal pain (HCC)    Bilateral carotid artery disease (HCC)    Breast pain, left 08/29/2022   CAD (coronary artery disease) 07/2007   a.) s/p NSTEMI 07/2007 - sig multi-vessel CAD not amenable to PCI; b.) LHC 06/11/2016: multi vessel CAD not amenable to PCI --> refer to CVTS; c.) s/p 4v CABG 06/27/2016; d.) LHC 06/09/2023: 75% mLM, 80% o-pLAD, 60% mLAD, 100% p-mLCx, 100% OM1, 95% pRCA, 100% mRCA, 50% m-dLAD. LIMA-LAD and SVG-OM1 patent. CTO SVG-RCA with L-R collaterals - med mgmt   Carpal tunnel syndrome on left    a.) s/p release 04/2016   CKD (chronic kidney disease), stage III (HCC)    COVID-19 12/09/2022   Diabetic peripheral neuropathy (HCC)    Diastolic dysfunction    a.) TTE 06/25/2016: EF 60-654%. no RWMAs, G1DD, mild AoV annular calc; b.) TTE 03/19/2017: EF 50-55%, no RWMAs, mild MR; c.) TTE 07/22/2019: EF 55-60%, midl LVH, no RWMAs, triv MR, mild TR   Dizziness 04/30/2021   Dyspnea    GERD (gastroesophageal reflux disease)    Glaucoma    Headache    History of bilateral cataract extraction    HLD (hyperlipidemia)    HTN (hypertension)    Long term current use of clopidogrel    Long-term  use of aspirin therapy    MRSA (methicillin resistant staph aureus) culture positive    2006 - 2008   Myocardial infarction (HCC) 04/04/2016   NSTEMI (non-ST elevated myocardial infarction) (HCC) 07/27/2007   a.) LHC 07/30/2007: 30% mLAD, 99% D1, 50% pLCx, 40% mLCx, 70/75/85% dLCx, 50% OM1, 75/80/80 OM2, 50% pRCA, 60% mRCA, 85% dRCA --> not amenable to PCI   OSA on CPAP    Osteoarthritis    PAD (peripheral artery disease) (HCC)    a. 03/2022 Lower Ext Angio: No signif Ao-iliac dzs. R PT diff dzs and occluded above ankle. R Pedal  arch intact w/ excellent antegrade flow provided by R AT-->no indication for revasc.   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   a.) LIMA-LAD, SVG-RCA, seq SVG-OM1-OM2   S/P tube myringotomy    Skin cancer, basal cell    Skin ulcer of right great toe, unspecified ulcer stage (HCC)    T2DM (type 2 diabetes mellitus) (HCC)    Wears glasses    Wears partial dentures    top   Past Surgical History:  Procedure Laterality Date   ABDOMINAL AORTOGRAM W/LOWER EXTREMITY N/A 03/26/2022   Procedure: ABDOMINAL AORTOGRAM W/LOWER EXTREMITY;  Surgeon: Iran Ouch, MD;  [facility]: [facility];  Service: Cardiovascular;  Laterality: N/A;   AMPUTATION TOE Bilateral 06/26/2023   Procedure: AMPUTATION TOE MPJ OF THE JOINT,PARTIAL TOE AMPUTUATION;  Surgeon: Felecia Shelling, DPM;  Location: [facility] ORS;  Service: Orthopedics/Podiatry;  Laterality: Bilateral;   APPENDECTOMY     CARDIAC CATHETERIZATION N/A 06/11/2016   Procedure: Left Heart Cath and Coronary Angiography;  Surgeon: Lamar Blinks, MD;  Location: [facility]VAS[facility] CV LAB;  Service: Cardiovascular;  Laterality: N/A;   CARPAL TUNNEL RELEASE Left 04/24/2016   Procedure: CARPAL TUNNEL RELEASE;  Surgeon: Kennedy Bucker, MD;  Location: [facility] ORS;  Service: Orthopedics;  Laterality: Left;   CATARACT EXTRACTION W/ INTRAOCULAR LENS  IMPLANT, BILATERAL Bilateral    CATARACT EXTRACTION, BILATERAL     R eye 07/16/12, L eye 08/13/12 - with lens implant in both eyes   CORONARY ARTERY BYPASS GRAFT N/A 06/27/2016   Procedure: CORONARY ARTERY BYPASS GRAFTING times four using left internal mammary artery and right leg saphenous vein;  Surgeon: Kerin Perna, MD;  Location: [facility] OR;  Service: Open Heart Surgery;  Laterality: N/A;   FOREARM FRACTURE SURGERY Left 1961   KNEE ARTHROPLASTY Left 02/11/2016   Procedure: COMPUTER ASSISTED TOTAL KNEE ARTHROPLASTY;  Surgeon: Donato Heinz, MD;  Location: [facility] ORS;  Service: Orthopedics;   Laterality: Left;   KNEE ARTHROSCOPY Right 2000   LEFT HEART CATH AND CORONARY ANGIOGRAPHY Left 07/30/2007   Procedure: LEFT HEART CATH AND CORONARY ANGIOGRAPHY; Location: [facility]; Surgeon: Arnoldo Hooker, MD   LEFT HEART CATH AND CORS/GRAFTS ANGIOGRAPHY Left 01/13/2017   Procedure: Left Heart Cath and Cors/Grafts Angiography;  Surgeon: Lamar Blinks, MD;  Location: [facility] CV LAB;  Service: Cardiovascular;  Laterality: Left;   LEFT HEART CATH AND CORS/GRAFTS ANGIOGRAPHY Left 06/09/2023   Procedure: LEFT HEART CATH AND CORS/GRAFTS ANGIOGRAPHY;  Surgeon: Marcina Millard, MD;  Location: [facility] CV LAB;  Service: Cardiovascular;  Laterality: Left;   METATARSAL HEAD EXCISION Right 03/21/2020   Procedure: METATARSAL HEAD RESECTION RIGHT AND DEBRIDEMENT OF ULCER;  Surgeon: Felecia Shelling, DPM;  Location: [facility] OR;  Service: Podiatry;  Laterality: Right;   METATARSAL HEAD EXCISION Left 09/15/2023   Procedure: METATARSAL HEAD EXCISION;  Surgeon: Felecia Shelling, DPM;  Location: [facility] ORS;  Service: Orthopedics/Podiatry;  Laterality: Left;   MULTIPLE TOOTH EXTRACTIONS     TEE WITHOUT CARDIOVERSION N/A 06/27/2016   Procedure: TRANSESOPHAGEAL ECHOCARDIOGRAM (TEE);  Surgeon: Kerin Perna, MD;  Location: [facility] OR;  Service: Open Heart Surgery;  Laterality: N/A;   Family History  Problem Relation Age of Onset   Hypertension Mother    Lupus Mother    Heart Problems Mother    Hypertension Father    Diabetes Father    Heart attack Father 10       died in his 47s   Drug abuse Brother    Heart disease Brother    Heart attack Maternal Grandmother    Arthritis Maternal Grandmother    Arthritis Maternal Grandfather    Heart attack Paternal Grandmother    Arthritis Paternal Grandmother    Arthritis Paternal Grandfather    Social History   Tobacco Use   Smoking status: Former    Current packs/day: 0.00    Average packs/day: 1 pack/day for 16.7 years (16.7 ttl pk-yrs)    Types: Cigarettes     Start date: 11/03/1969    Quit date: 07/22/1986    Years since quitting: 37.3   Smokeless tobacco: Never  Vaping Use   Vaping status: Never Used  Substance Use Topics   Alcohol use: Not Currently   Drug use: No    Pertinent Clinical Results:  LABS:   No visits with results within 3 Day(s) from this visit.  Latest known visit with results is:  Office Visit on 11/02/2023  Component Date Value Ref Range Status   Sodium 11/02/2023 141  135 - 145 mEq/L Final   Potassium 11/02/2023 4.2  3.5 - 5.1 mEq/L Final   Chloride 11/02/2023 101  96 - 112 mEq/L Final   CO2 11/02/2023 30  19 - 32 mEq/L Final   Glucose, Bld 11/02/2023 115 (H)  70 - 99 mg/dL Final   BUN 16/08/9603 26 (H)  6 - 23 mg/dL Final   Creatinine, Ser 11/02/2023 1.34  0.40 - 1.50 mg/dL Final   Total Bilirubin 11/02/2023 0.7  0.2 - 1.2 mg/dL Final   Alkaline Phosphatase 11/02/2023 90  39 - 117 U/L Final   AST 11/02/2023 14  0 - 37 U/L Final   ALT 11/02/2023 12  0 - 53 U/L Final   Total Protein 11/02/2023 6.6  6.0 - 8.3 g/dL Final   Albumin 54/07/8118 4.0  3.5 - 5.2 g/dL Final   GFR 14/78/2956 52.06 (L)  >60.00 mL/min Final   Calculated using the CKD-EPI Creatinine Equation (2021)   Calcium 11/02/2023 9.5  8.4 - 10.5 mg/dL Final   PSA 21/30/8657 0.46  0.10 - 4.00 ng/ml Final   Test performed using Access Hybritech PSA Assay, a parmagnetic partical, chemiluminecent immunoassay.   Cholesterol 11/02/2023 130  0 - 200 mg/dL Final   ATP III Classification       Desirable:  < 200 mg/dL               Borderline High:  200 - 239 mg/dL          High:  > = 846 mg/dL   Triglycerides 96/29/5284 87.0  0.0 - 149.0 mg/dL Final   Normal:  <132 mg/dLBorderline High:  150 - 199 mg/dL   HDL 44/11/270 53.66  >39.00 mg/dL Final   VLDL 44/01/4741 17.4  0.0 - 40.0 mg/dL Final   LDL Cholesterol 11/02/2023 54  0 -  99 mg/dL Final   Total CHOL/HDL Ratio 11/02/2023 2   Final                  Men          Women1/2 Average Risk     3.4           3.3Average Risk          5.0          4.42X Average Risk          9.6          7.13X Average Risk          15.0          11.0                       NonHDL 11/02/2023 71.19   Final   NOTE:  Non-HDL goal should be 30 mg/dL higher than patient's LDL goal (i.e. LDL goal of < 70 mg/dL, would have non-HDL goal of < 100 mg/dL)    ECG: Date: 16/08/9603 Time ECG obtained: 05/10/2023 AM Rate: 64 bpm Rhythm: normal sinus Axis (leads I and aVF): Normal Intervals: PR 188 ms. QRS 93 ms. QTc 424 ms. ST segment and T wave changes: No evidence of acute ST segment elevation or depression.  Evidence of a possible age undetermined anteroseptal infarct present. Comparison: Similar to previous tracing obtained on 04/27/2023   IMAGING / PROCEDURES: DG FOOT COMPLETE LEFT/RIGHT FEET performed on 09/22/2023 Interval amputation of the second and third digits left foot as well as the great toe and second digit to the right foot with resection of the fifth metatarsal head as well.  Clean osteotomies.  No gas within the tissue.   LEFT HEART CATHETERIZATION AND CORONARY ANGIOGRAPHY performed on 06/09/2023 Normal left ventricular systolic function with an EF of 55-65%.   LVEDP normal.   Multivessel CAD 75% mid LM 80% ostial to proximal LAD 60% mid LAD 100% proximal and mid LCx 100% OM1 95% proximal RCA 100% mid RCA 50% mid to distal LAD Bypass grafts LIMA-LAD patent SVG-OM1 patent SVG-RCA (PDA) chronically occluded with left-to-right collaterals Recommendations Continue medical therapy and aggressive risk factor modification   VAS [place] ABI WITH/WO TBI performed on 10/07/2022 Resting right ankle-brachial index indicates noncompressible right lower extremity arteries. Right great toe amputation.  Resting left ankle-brachial index indicates noncompressible left lower extremity arteries. The left toe-brachial index is abnormal.   MYOCARDIAL PERFUSION IMAGING STUDY (LEXISCAN) performed on 07/22/2019 Normal  left ventricular systolic function with an EF of 55-65% No evidence of stress-induced myocardial ischemia or arrhythmia; no scintigraphic evidence of scar Normal liver study  TRANSTHORACIC ECHOCARDIOGRAM performed on 07/22/2019 Left ventricular ejection fraction, by visual estimation, is 55 to 60%. The left ventricle has normal function. Left ventricular septal wall thickness was mildly increased. Mildly increased left ventricular posterior wall thickness. There is mildly increased left ventricular hypertrophy.  Global right ventricle has normal systolic function.The right ventricular size is normal. No increase in right ventricular wall thickness.  Left atrial size was not well visualized.  Right atrial size was not well visualized.  The mitral valve was not well visualized. Trace mitral valve regurgitation.  The tricuspid valve is not well visualized. Tricuspid valve regurgitation is mild.  The aortic valve was not well visualized Aortic valve regurgitation is trivial by color flow Doppler.  The pulmonic valve was not well visualized. Pulmonic valve regurgitation was not assessed by color flow  Doppler.  The aortic root was not well visualized.  The interatrial septum was not assessed.   Impression and Plan:  Lawrence [name] has been referred for pre-anesthesia review and clearance prior to him undergoing the planned anesthetic and procedural courses. Available labs, pertinent testing, and imaging results were personally reviewed by me in preparation for upcoming operative/procedural course. [facility] medical record has been updated following extensive record review and patient interview with PAT staff.   This patient has been appropriately cleared by cardiology with an overall LOW risk of experiencing significant perioperative cardiovascular complications. Based on clinical review performed today (12/02/23), barring any significant acute changes in the patient's overall condition, it is  anticipated that he will be able to proceed with the planned surgical intervention. Any acute changes in clinical condition may necessitate his procedure being postponed and/or cancelled. Patient will meet with anesthesia team (MD and/or CRNA) on the day of his procedure for preoperative evaluation/assessment. Questions regarding anesthetic course will be fielded at that time.   Pre-surgical instructions were reviewed with the patient during his PAT appointment, and questions were fielded to satisfaction by PAT clinical staff. He has been instructed on which medications that he will need to hold prior to surgery, as well as the ones that have been deemed safe/appropriate to take on the day of his procedure. As part of the general education provided by PAT, patient made aware both verbally and in writing, that he would need to abstain from the use of any illegal substances during his perioperative course.  He was advised that failure to follow the provided instructions could necessitate case cancellation or result in serious perioperative complications up to and including death. Patient encouraged to contact PAT and/or his surgeon's office to discuss any questions or concerns that may arise prior to surgery; verbalized understanding.   Quentin Mulling, MSN, APRN, FNP-C, CEN [facility]  Perioperative Services Nurse Practitioner Phone: [phone] Fax: [phone] 12/02/23 12:43 PM  NOTE: This note has been prepared using Dragon dictation software. Despite my best ability to proofread, there is always the potential that unintentional transcriptional errors may still occur from this process.

## 2023-12-03 MED ORDER — CHLORHEXIDINE GLUCONATE 0.12 % MT SOLN
15.0000 mL | Freq: Once | OROMUCOSAL | Status: AC
  Administered 2023-12-04: 15 mL via OROMUCOSAL

## 2023-12-03 MED ORDER — CEFAZOLIN SODIUM-DEXTROSE 2-4 GM/100ML-% IV SOLN
2.0000 g | INTRAVENOUS | Status: AC
  Administered 2023-12-04: 3 g via INTRAVENOUS

## 2023-12-03 MED ORDER — SODIUM CHLORIDE 0.9 % IV SOLN: INTRAVENOUS | Status: DC

## 2023-12-03 MED ORDER — ORAL CARE MOUTH RINSE: 15.0000 mL | Freq: Once | OROMUCOSAL | Status: AC

## 2023-12-04 ENCOUNTER — Encounter: Payer: Self-pay | Admitting: Podiatry

## 2023-12-04 ENCOUNTER — Ambulatory Visit
Admission: RE | Admit: 2023-12-04 | Discharge: 2023-12-04 | Disposition: A | Discharge status: Home / Self Care | Payer: Medicare Other | Attending: Podiatry | Admitting: Podiatry

## 2023-12-04 ENCOUNTER — Other Ambulatory Visit: Payer: Self-pay

## 2023-12-04 ENCOUNTER — Ambulatory Visit: Payer: Medicare Other | Admitting: Urgent Care

## 2023-12-04 ENCOUNTER — Ambulatory Visit: Payer: Medicare Other

## 2023-12-04 ENCOUNTER — Encounter: Payer: Medicare Other | Admitting: Podiatry

## 2023-12-04 ENCOUNTER — Encounter
Admission: RE | Disposition: A | Discharge status: Home / Self Care | Payer: Self-pay | Source: Home / Self Care | Attending: Podiatry

## 2023-12-04 DIAGNOSIS — E1142 Type 2 diabetes mellitus with diabetic polyneuropathy: Secondary | ICD-10-CM | POA: Diagnosis not present

## 2023-12-04 DIAGNOSIS — M86171 Other acute osteomyelitis, right ankle and foot: Secondary | ICD-10-CM | POA: Diagnosis not present

## 2023-12-04 DIAGNOSIS — E11621 Type 2 diabetes mellitus with foot ulcer: Secondary | ICD-10-CM | POA: Diagnosis not present

## 2023-12-04 DIAGNOSIS — L97519 Non-pressure chronic ulcer of other part of right foot with unspecified severity: Secondary | ICD-10-CM | POA: Insufficient documentation

## 2023-12-04 DIAGNOSIS — Z79899 Other long term (current) drug therapy: Secondary | ICD-10-CM | POA: Diagnosis not present

## 2023-12-04 DIAGNOSIS — Z87891 Personal history of nicotine dependence: Secondary | ICD-10-CM | POA: Diagnosis not present

## 2023-12-04 DIAGNOSIS — I2510 Atherosclerotic heart disease of native coronary artery without angina pectoris: Secondary | ICD-10-CM | POA: Diagnosis not present

## 2023-12-04 DIAGNOSIS — L97529 Non-pressure chronic ulcer of other part of left foot with unspecified severity: Secondary | ICD-10-CM | POA: Insufficient documentation

## 2023-12-04 DIAGNOSIS — K219 Gastro-esophageal reflux disease without esophagitis: Secondary | ICD-10-CM | POA: Diagnosis not present

## 2023-12-04 DIAGNOSIS — I252 Old myocardial infarction: Secondary | ICD-10-CM | POA: Diagnosis not present

## 2023-12-04 DIAGNOSIS — M869 Osteomyelitis, unspecified: Secondary | ICD-10-CM | POA: Diagnosis present

## 2023-12-04 DIAGNOSIS — M86172 Other acute osteomyelitis, left ankle and foot: Secondary | ICD-10-CM | POA: Diagnosis not present

## 2023-12-04 DIAGNOSIS — E1065 Type 1 diabetes mellitus with hyperglycemia: Secondary | ICD-10-CM

## 2023-12-04 DIAGNOSIS — I10 Essential (primary) hypertension: Secondary | ICD-10-CM | POA: Diagnosis not present

## 2023-12-04 HISTORY — PX: TENDON LENGTHENING: SHX6786

## 2023-12-04 HISTORY — PX: TRANSMETATARSAL AMPUTATION: SHX6197

## 2023-12-04 HISTORY — PX: AMPUTATION TOE: SHX6595

## 2023-12-04 LAB — GLUCOSE, CAPILLARY
Glucose-Capillary: 322 mg/dL — ABNORMAL HIGH (ref 70–99)
Glucose-Capillary: 300 mg/dL — ABNORMAL HIGH (ref 70–99)
Glucose-Capillary: 346 mg/dL — ABNORMAL HIGH (ref 70–99)
Glucose-Capillary: 250 mg/dL — ABNORMAL HIGH (ref 70–99)

## 2023-12-04 SURGERY — AMPUTATION, FOOT, TRANSMETATARSAL
Anesthesia: General | Site: Toe | Laterality: Right

## 2023-12-04 MED ORDER — INSULIN ASPART 100 UNIT/ML IJ SOLN
INTRAMUSCULAR | Status: AC
  Filled 2023-12-04: qty 1

## 2023-12-04 MED ORDER — FENTANYL CITRATE (PF) 100 MCG/2ML IJ SOLN: 25.0000 ug | INTRAMUSCULAR | Status: DC | PRN

## 2023-12-04 MED ORDER — ONDANSETRON HCL 4 MG/2ML IJ SOLN
INTRAMUSCULAR | Status: DC | PRN
  Administered 2023-12-04: 4 mg via INTRAVENOUS

## 2023-12-04 MED ORDER — LIDOCAINE HCL (PF) 2 % IJ SOLN
INTRAMUSCULAR | Status: AC
  Filled 2023-12-04: qty 5

## 2023-12-04 MED ORDER — GLYCOPYRROLATE 0.2 MG/ML IJ SOLN
INTRAMUSCULAR | Status: DC | PRN
  Administered 2023-12-04: .2 mg via INTRAVENOUS

## 2023-12-04 MED ORDER — ACETAMINOPHEN 10 MG/ML IV SOLN
INTRAVENOUS | Status: AC
  Filled 2023-12-04: qty 100

## 2023-12-04 MED ORDER — BUPIVACAINE HCL (PF) 0.5 % IJ SOLN
INTRAMUSCULAR | Status: AC
  Filled 2023-12-04: qty 30

## 2023-12-04 MED ORDER — 0.9 % SODIUM CHLORIDE (POUR BTL) OPTIME
TOPICAL | Status: DC | PRN
  Administered 2023-12-04: 500 mL

## 2023-12-04 MED ORDER — FENTANYL CITRATE (PF) 100 MCG/2ML IJ SOLN
INTRAMUSCULAR | Status: AC
  Filled 2023-12-04: qty 2

## 2023-12-04 MED ORDER — PROPOFOL 500 MG/50ML IV EMUL
INTRAVENOUS | Status: DC | PRN
  Administered 2023-12-04: 130 ug/kg/min via INTRAVENOUS

## 2023-12-04 MED ORDER — FENTANYL CITRATE (PF) 100 MCG/2ML IJ SOLN
INTRAMUSCULAR | Status: DC | PRN
  Administered 2023-12-04 (×2): 50 ug via INTRAVENOUS

## 2023-12-04 MED ORDER — OXYCODONE HCL 5 MG PO TABS: 5.0000 mg | ORAL_TABLET | Freq: Once | ORAL | Status: DC | PRN

## 2023-12-04 MED ORDER — OXYCODONE HCL 5 MG/5ML PO SOLN: 5.0000 mg | Freq: Once | ORAL | Status: DC | PRN

## 2023-12-04 MED ORDER — CEFAZOLIN SODIUM-DEXTROSE 1-4 GM/50ML-% IV SOLN
INTRAVENOUS | Status: AC
  Filled 2023-12-04: qty 50

## 2023-12-04 MED ORDER — CEFAZOLIN SODIUM-DEXTROSE 2-4 GM/100ML-% IV SOLN
INTRAVENOUS | Status: AC
  Filled 2023-12-04: qty 100

## 2023-12-04 MED ORDER — PHENYLEPHRINE 80 MCG/ML (10ML) SYRINGE FOR IV PUSH (FOR BLOOD PRESSURE SUPPORT)
PREFILLED_SYRINGE | INTRAVENOUS | Status: DC | PRN
  Administered 2023-12-04 (×6): 80 ug via INTRAVENOUS

## 2023-12-04 MED ORDER — LIDOCAINE HCL (PF) 1 % IJ SOLN
INTRAMUSCULAR | Status: AC
  Filled 2023-12-04: qty 30

## 2023-12-04 MED ORDER — PROPOFOL 1000 MG/100ML IV EMUL
INTRAVENOUS | Status: AC
  Filled 2023-12-04: qty 100

## 2023-12-04 MED ORDER — LIDOCAINE HCL 1 % IJ SOLN
INTRAMUSCULAR | Status: DC | PRN
  Administered 2023-12-04: 30 mL

## 2023-12-04 MED ORDER — OXYCODONE-ACETAMINOPHEN 5-325 MG PO TABS: 1.0000 | ORAL_TABLET | ORAL | 0 refills | Status: DC | PRN

## 2023-12-04 MED ORDER — INSULIN ASPART 100 UNIT/ML IJ SOLN
20.0000 [IU] | Freq: Once | INTRAMUSCULAR | Status: AC
  Administered 2023-12-04: 20 [IU] via SUBCUTANEOUS

## 2023-12-04 MED ORDER — ACETAMINOPHEN 10 MG/ML IV SOLN
INTRAVENOUS | Status: DC | PRN
  Administered 2023-12-04: 1000 mg via INTRAVENOUS

## 2023-12-04 SURGICAL SUPPLY — 47 items
BLADE MED AGGRESSIVE (BLADE) ×3 IMPLANT
BNDG COHESIVE 4X5 TAN STRL LF (GAUZE/BANDAGES/DRESSINGS) ×3 IMPLANT
BNDG ELASTIC 4INX 5YD STR LF (GAUZE/BANDAGES/DRESSINGS) ×3 IMPLANT
BNDG ELASTIC 4X5.8 VLCR NS LF (GAUZE/BANDAGES/DRESSINGS) ×3 IMPLANT
BNDG ESMARCH 4X12 STRL LF (GAUZE/BANDAGES/DRESSINGS) ×3 IMPLANT
BNDG GAUZE DERMACEA FLUFF 4 (GAUZE/BANDAGES/DRESSINGS) ×3 IMPLANT
BNDG STRETCH 4X75 STRL LF (GAUZE/BANDAGES/DRESSINGS) ×3 IMPLANT
COLLAGEN CELLERATERX 1 GRAM (Miscellaneous) IMPLANT
CUFF TOURN SGL QUICK 18X4 (TOURNIQUET CUFF) ×3 IMPLANT
DRAPE BILAT LIMB 76X120 89291 (MISCELLANEOUS) IMPLANT
DRAPE FLUOR MINI C-ARM 54X84 (DRAPES) IMPLANT
DRSG EMULSION OIL 3X8 NADH (GAUZE/BANDAGES/DRESSINGS) IMPLANT
DURAPREP 26ML APPLICATOR (WOUND CARE) ×3 IMPLANT
ELECT REM PT RETURN 9FT ADLT (ELECTROSURGICAL) ×3
ELECTRODE REM PT RTRN 9FT ADLT (ELECTROSURGICAL) ×3 IMPLANT
GAUZE SPONGE 4X4 12PLY STRL (GAUZE/BANDAGES/DRESSINGS) ×6 IMPLANT
GAUZE XEROFORM 1X8 LF (GAUZE/BANDAGES/DRESSINGS) ×3 IMPLANT
GLOVE BIO SURGEON STRL SZ8 (GLOVE) ×3 IMPLANT
GLOVE BIOGEL PI IND STRL 8 (GLOVE) ×3 IMPLANT
GOWN STRL REUS W/ TWL LRG LVL3 (GOWN DISPOSABLE) ×6 IMPLANT
GOWN STRL REUS W/ TWL XL LVL3 (GOWN DISPOSABLE) ×3 IMPLANT
KIT TURNOVER KIT A (KITS) ×3 IMPLANT
LABEL OR SOLS (LABEL) ×3 IMPLANT
MANIFOLD NEPTUNE II (INSTRUMENTS) ×3 IMPLANT
NDL FILTER BLUNT 18X1 1/2 (NEEDLE) ×3 IMPLANT
NDL HYPO 25X1 1.5 SAFETY (NEEDLE) ×6 IMPLANT
NDL SAFETY ECLIPSE 18X1.5 (NEEDLE) ×3 IMPLANT
NEEDLE FILTER BLUNT 18X1 1/2 (NEEDLE) ×3
NEEDLE HYPO 25X1 1.5 SAFETY (NEEDLE) ×9
NS IRRIG 500ML POUR BTL (IV SOLUTION) ×3 IMPLANT
PACK EXTREMITY ARMC (MISCELLANEOUS) ×3 IMPLANT
PAD ABD DERMACEA PRESS 5X9 (GAUZE/BANDAGES/DRESSINGS) ×6 IMPLANT
SOL PREP PVP 2OZ (MISCELLANEOUS) ×3
SOLUTION PREP PVP 2OZ (MISCELLANEOUS) ×3 IMPLANT
SPONGE T-LAP 18X18 ~~LOC~~+RFID (SPONGE) IMPLANT
STAPLER SKIN PROX 35W (STAPLE) IMPLANT
STOCKINETTE M/LG 89821 (MISCELLANEOUS) ×3 IMPLANT
STOCKINETTE STRL 6IN 960660 (GAUZE/BANDAGES/DRESSINGS) ×3 IMPLANT
SUT ETHILON 3 0 FSLX (SUTURE) IMPLANT
SUT ETHILON 3-0 FS-10 30 BLK (SUTURE) ×6
SUT PROLENE 3 0 PS 2 (SUTURE) IMPLANT
SUT VIC AB 3-0 SH 27X BRD (SUTURE) ×3 IMPLANT
SUT VICRYL+ 4-0 18IN PS-4 (SUTURE) IMPLANT
SUTURE EHLN 3-0 FS-10 30 BLK (SUTURE) ×6 IMPLANT
SYR 10ML LL (SYRINGE) ×6 IMPLANT
TRAP FLUID SMOKE EVACUATOR (MISCELLANEOUS) ×3 IMPLANT
WATER STERILE IRR 500ML POUR (IV SOLUTION) ×3 IMPLANT

## 2023-12-04 NOTE — Interval H&P Note (Signed)
History and Physical Interval Note:  12/04/2023 4:23 PM  Lawrence [name]  has presented today for surgery, with the diagnosis of osteomyelitis.  The various methods of treatment have been discussed with the patient and family. After consideration of risks, benefits and other options for treatment, the patient has consented to  Procedure(s): TRANSMETATARSAL AMPUTATION (Left) TENDON-ACHILLES LENGTHENING (Left) AMPUTATION TOE INTERPHALANGEAL 3RD (Right) as a surgical intervention.  The patient's history has been reviewed, patient examined, no change in status, stable for surgery.  I have reviewed the patient's chart and labs.  Questions were answered to the patient's satisfaction.     Felecia Shelling

## 2023-12-04 NOTE — Transfer of Care (Signed)
Immediate Anesthesia Transfer of Care Note  Patient: [name]  Procedure(s) Performed: TRANSMETATARSAL AMPUTATION (Left: Toe) TENDON-ACHILLES LENGTHENING (Left) AMPUTATION TOE INTERPHALANGEAL 3RD (Right: Toe)  Patient Location: PACU  Anesthesia Type:General  Level of Consciousness: drowsy  Airway & Oxygen Therapy: Patient Spontanous Breathing and Patient connected to face mask oxygen  Post-op Assessment: Report given to RN  Post vital signs: stable  Last Vitals:  Vitals Value Taken Time  BP    Temp    Pulse 70 12/04/23 1747  Resp    SpO2 100 % 12/04/23 1747  Vitals shown include unfiled device data.  Last Pain:  Vitals:   12/04/23 1511  TempSrc: Oral  PainSc: 0-No pain         Complications: No notable events documented.

## 2023-12-04 NOTE — Anesthesia Preprocedure Evaluation (Addendum)
Anesthesia Evaluation  Patient identified by MRN, date of birth, ID band Patient awake    Reviewed: Allergy & Precautions, NPO status , Patient's Chart, lab work & pertinent test results  History of Anesthesia Complications Negative for: history of anesthetic complications  Airway Mallampati: III  TM Distance: <3 FB Neck ROM: full    Dental  (+) Chipped, Missing, Poor Dentition   Pulmonary neg shortness of breath, sleep apnea , former smoker   Pulmonary exam normal        Cardiovascular Exercise Tolerance: Good hypertension, (-) angina + CAD, + Past MI and + Peripheral Vascular Disease  Normal cardiovascular exam     Neuro/Psych  Headaches  Neuromuscular disease  negative psych ROS   GI/Hepatic Neg liver ROS,GERD  Controlled,,  Endo/Other  diabetes    Renal/GU Renal disease  negative genitourinary   Musculoskeletal   Abdominal   Peds  Hematology negative hematology ROS (+)   Anesthesia Other Findings Patient has cardiac clearance for this procedure.   Past Medical History: No date: Allergies No date: Anginal pain (HCC) No date: Bilateral carotid artery disease (HCC) 08/29/2022: Breast pain, left 07/2007: CAD (coronary artery disease)     Comment:  a.) s/p NSTEMI 07/2007 - sig multi-vessel CAD not               amenable to PCI; b.) LHC 06/11/2016: multi vessel CAD not              amenable to PCI --> refer to CVTS; c.) s/p 4v CABG               06/27/2016; d.) LHC 06/09/2023: 75% mLM, 80% o-pLAD, 60%               mLAD, 100% p-mLCx, 100% OM1, 95% pRCA, 100% mRCA, 50%               m-dLAD. LIMA-LAD and SVG-OM1 patent. CTO SVG-RCA with L-R              collaterals - med mgmt No date: Carpal tunnel syndrome on left     Comment:  a.) s/p release 04/2016 No date: CKD (chronic kidney disease), stage III (HCC) 12/09/2022: COVID-19 No date: Diabetic peripheral neuropathy (HCC) No date: Diastolic dysfunction      Comment:  a.) TTE 06/25/2016: EF 60-654%. no RWMAs, G1DD, mild AoV              annular calc; b.) TTE 03/19/2017: EF 50-55%, no RWMAs,               mild MR; c.) TTE 07/22/2019: EF 55-60%, midl LVH, no               RWMAs, triv MR, mild TR 04/30/2021: Dizziness No date: Dyspnea No date: GERD (gastroesophageal reflux disease) No date: Glaucoma No date: Headache No date: History of bilateral cataract extraction No date: HLD (hyperlipidemia) No date: HTN (hypertension) No date: Long term current use of clopidogrel No date: Long-term use of aspirin therapy No date: MRSA (methicillin resistant staph aureus) culture positive     Comment:  2006 - 2008 04/04/2016: Myocardial infarction (HCC) 07/27/2007: NSTEMI (non-ST elevated myocardial infarction) (HCC)     Comment:  a.) LHC 07/30/2007: 30% mLAD, 99% D1, 50% pLCx, 40%               mLCx, 70/75/85% dLCx, 50% OM1, 75/80/80 OM2, 50% pRCA,  60% mRCA, 85% dRCA --> not amenable to PCI No date: OSA on CPAP No date: Osteoarthritis No date: PAD (peripheral artery disease) (HCC)     Comment:  a. 03/2022 Lower Ext Angio: No signif Ao-iliac dzs. R PT               diff dzs and occluded above ankle. R Pedal arch intact w/              excellent antegrade flow provided by R AT-->no indication              for revasc. 07/11/2020: Pilonidal cyst 02/29/2020: Pruritus 06/27/2016: S/P CABG x 4     Comment:  a.) LIMA-LAD, SVG-RCA, seq SVG-OM1-OM2 No date: S/P tube myringotomy No date: Skin cancer, basal cell No date: Skin ulcer of right great toe, unspecified ulcer stage (HCC) No date: T2DM (type 2 diabetes mellitus) (HCC) No date: Wears glasses No date: Wears partial dentures     Comment:  top  Past Surgical History: 03/26/2022: ABDOMINAL AORTOGRAM W/LOWER EXTREMITY; N/A     Comment:  Procedure: ABDOMINAL AORTOGRAM W/LOWER EXTREMITY;                Surgeon: Iran Ouch, MD;  Location: [facility]              LAB;  Service:  Cardiovascular;  Laterality: N/A; 06/26/2023: AMPUTATION TOE; Bilateral     Comment:  Procedure: AMPUTATION TOE MPJ OF THE JOINT,PARTIAL TOE               AMPUTUATION;  Surgeon: Felecia Shelling, DPM;  Location:               [facility] ORS;  Service: Orthopedics/Podiatry;  Laterality:               Bilateral; No date: APPENDECTOMY 06/11/2016: CARDIAC CATHETERIZATION; N/A     Comment:  Procedure: Left Heart Cath and Coronary Angiography;                Surgeon: Lamar Blinks, MD;  Location: [facility] INVASIVE               CV LAB;  Service: Cardiovascular;  Laterality: N/A; 04/24/2016: CARPAL TUNNEL RELEASE; Left     Comment:  Procedure: CARPAL TUNNEL RELEASE;  Surgeon: Kennedy Bucker, MD;  Location: [facility] ORS;  Service: Orthopedics;                Laterality: Left; No date: CATARACT EXTRACTION W/ INTRAOCULAR LENS  IMPLANT, BILATERAL;  Bilateral No date: CATARACT EXTRACTION, BILATERAL     Comment:  R eye 07/16/12, L eye 08/13/12 - with lens implant in               both eyes 06/27/2016: CORONARY ARTERY BYPASS GRAFT; N/A     Comment:  Procedure: CORONARY ARTERY BYPASS GRAFTING times four               using left internal mammary artery and right leg               saphenous vein;  Surgeon: Kerin Perna, MD;  Location:              [facility] OR;  Service: Open Heart Surgery;  Laterality: N/A; 1961: FOREARM FRACTURE SURGERY; Left 02/11/2016: KNEE ARTHROPLASTY; Left     Comment:  Procedure: COMPUTER ASSISTED TOTAL KNEE ARTHROPLASTY;  Surgeon: Donato Heinz, MD;  Location: [facility] ORS;                Service: Orthopedics;  Laterality: Left; 2000: KNEE ARTHROSCOPY; Right 07/30/2007: LEFT HEART CATH AND CORONARY ANGIOGRAPHY; Left     Comment:  Procedure: LEFT HEART CATH AND CORONARY ANGIOGRAPHY;               Location: [facility]; Surgeon: Arnoldo Hooker, MD 01/13/2017: LEFT HEART CATH AND CORS/GRAFTS ANGIOGRAPHY; Left     Comment:  Procedure: Left Heart Cath and Cors/Grafts Angiography;                Surgeon: Lamar Blinks, MD;  Location: [facility] INVASIVE               CV LAB;  Service: Cardiovascular;  Laterality: Left; 06/09/2023: LEFT HEART CATH AND CORS/GRAFTS ANGIOGRAPHY; Left     Comment:  Procedure: LEFT HEART CATH AND CORS/GRAFTS ANGIOGRAPHY;               Surgeon: Marcina Millard, MD;  Location: [facility]               INVASIVE CV LAB;  Service: Cardiovascular;  Laterality:               Left; 03/21/2020: METATARSAL HEAD EXCISION; Right     Comment:  Procedure: METATARSAL HEAD RESECTION RIGHT AND               DEBRIDEMENT OF ULCER;  Surgeon: Felecia Shelling, DPM;                Location: [facility] OR;  Service: Podiatry;  Laterality: Right; 09/15/2023: METATARSAL HEAD EXCISION; Left     Comment:  Procedure: METATARSAL HEAD EXCISION;  Surgeon: Felecia Shelling, DPM;  Location: [facility] ORS;  Service:               Orthopedics/Podiatry;  Laterality: Left; No date: MULTIPLE TOOTH EXTRACTIONS 06/27/2016: TEE WITHOUT CARDIOVERSION; N/A     Comment:  Procedure: TRANSESOPHAGEAL ECHOCARDIOGRAM (TEE);                Surgeon: Kerin Perna, MD;  Location: [facility] OR;  Service:              Open Heart Surgery;  Laterality: N/A;     Reproductive/Obstetrics negative OB ROS                             Anesthesia Physical Anesthesia Plan  ASA: 3  Anesthesia Plan: General   Post-op Pain Management:    Induction: Intravenous  PONV Risk Score and Plan: Propofol infusion and TIVA  Airway Management Planned: Natural Airway and Nasal Cannula  Additional Equipment:   Intra-op Plan:   Post-operative Plan:   Informed Consent: I have reviewed the patients History and Physical, chart, labs and discussed the procedure including the risks, benefits and alternatives for the proposed anesthesia with the patient or authorized representative who has indicated his/her understanding and acceptance.     Dental Advisory Given  Plan Discussed with:  Anesthesiologist, CRNA and Surgeon  Anesthesia Plan Comments: (Patient consented for risks of anesthesia including but not limited to:  - adverse reactions to medications - risk of airway placement if required - damage to eyes, teeth, lips or other oral mucosa - nerve damage due to positioning  - sore throat  or hoarseness - Damage to heart, brain, nerves, lungs, other parts of body or loss of life  Patient voiced understanding and assent.)       Anesthesia Quick Evaluation

## 2023-12-04 NOTE — Op Note (Signed)
OPERATIVE REPORT Patient name: Lawrence [name] MRN: [id number] DOB: 07-29-1951  DOS: 12/04/23  Preop Dx: Osteomyelitis LT foot.  Osteomyelitis RT third toe Postop Dx: same  Procedure:  1.  Transmetatarsal amputation left foot 2.  Tendo Achilles lengthening left 3.  Partial toe amputation RT third toe  Surgeon: Felecia Shelling DPM  Anesthesia: 50-50 mixture of 1% lidocaine plain with 0.5% Marcaine plain totaling 20mL infiltrated in the patient's LT ankle and right digital block  Hemostasis: Calf tourniquet inflated to a pressure of without esmarch exsanguination bilateral  EBL: 10 mL Materials: CellerateRX hydrolyzed collagen powder Injectables: none Pathology: none  Condition: The patient tolerated the procedure and anesthesia well. No complications noted or reported   Justification for procedure: The patient is a 73 y.o.  who presents today for surgical correction of osteomyelitis to the left foot with history of multiple prior amputations to the toes as well as osteomyelitis to the right third toe. Conservative modalities of been unsuccessful in providing any sort of satisfactory resolution for the patient. The patient was told of the benefits as well as possible side effects of the surgery. The patient consented for surgical correction. The patient consent form was reviewed. All patient questions were answered. No guarantees were expressed or implied. The patient and the surgeon both signed the patient consent form with the witness present and placed in the patient's chart.   Procedure in Detail: The patient was brought to the operating room, placed in the operating table in the supine position at which time an aseptic scrub and drape were performed about the patient's respective lower extremity after anesthesia was induced as described above. Attention was then directed to the surgical area where procedure number one commenced.  Procedure #1: Transmetatarsal amputation  left foot A fishmouth type incision was planned and made using a #10 scalpel around the midportion of the metatarsals 1-5.  The incision was carried directly down to the level of bone.  Soft tissue reflection from the dorsal aspect of the metatarsals was achieved in preparation for the ensuing osteotomy.  Osteotomy of metatarsals 1-5 was performed using a sagittal blade mounted on a sagittal saw and a slight dorsal distal to proximal plantar orientation.  All soft tissue around the osteotomy was then released and the distal portion of the foot was removed and placed in sterile specimen container.  Any exposed extensor or flexor tendons as well as nonviable tissue was excisionally debrided away.  Copious irrigation was then utilized with normal saline followed by primary closure of the skin flaps using a combination of skin staples and nylon suture.  Procedure #2: Tendo Achilles lengthening left Attention was then directed to the posterior aspect of the ankle where 3 small percutaneous incisions were made beginning approximately 4 cm proximal to the insertion of the calcaneal tubercle and spaced approximately 1.5 cm apart.  The most distal and proximal incision began by bisecting the Achilles tendon and extending medial and a transverse orientation to release the medial fibers of the Achilles.  The central incision began dissecting the Achilles tendon and extended laterally in a transverse orientation releasing the lateral fibers.  This created increased ankle joint dorsiflexion and in essence creating an accordion type lengthening of the Achilles to alleviate pressure from the amputation stump postoperatively.  Primary closure achieved using 4-0 Prolene suture  Procedure #3: Partial toe amputation right third toe Attention was then directed to the right third toe where a fishmouth type incision was planned and  made overlying the central portion of the right third toe.  The incision was carried directly down  to the level of bone and middle phalanx.  After exposure of the middle portion of the phalanx an osteotomy was created at the midportion of the middle phalanx using a sagittal blade mounted sagittal saw.  The distal portion of the toe was disarticulated at the osteotomy site and removed in toto.  Irrigation was utilized and primary closure was achieved using 4-0 Prolene suture.  Dry sterile compressive dressings were then applied to all previously mentioned incision sites about the patient's lower extremity. The tourniquet which was used for hemostasis was deflated. The patient was then transferred from the operating room to the recovery room having tolerated the procedure and anesthesia well. All vital signs are stable. After a brief stay in the recovery room the patient was readmitted to inpatient room with postoperative orders placed.     Felecia Shelling, DPM [facility]  Dr. Felecia Shelling, DPM    [address] [place], [place] [address]                Office [phone]  Fax [phone]

## 2023-12-04 NOTE — Progress Notes (Signed)
Pt's glucose 322. MD Joelene Millin aware and notified.  20 units of Novolog insulin subc given to patient per verbal order from MD Joelene Millin, verified order and units by 2nd Rn [employer].

## 2023-12-04 NOTE — Anesthesia Postprocedure Evaluation (Signed)
Anesthesia Post Note  Patient: [name]  Procedure(s) Performed: TRANSMETATARSAL AMPUTATION (Left: Toe) TENDON-ACHILLES LENGTHENING (Left) AMPUTATION TOE INTERPHALANGEAL 3RD (Right: Toe)  Patient location during evaluation: PACU Anesthesia Type: General Level of consciousness: awake and alert Pain management: pain level controlled Vital Signs Assessment: post-procedure vital signs reviewed and stable Respiratory status: spontaneous breathing, nonlabored ventilation, respiratory function stable and patient connected to nasal cannula oxygen Cardiovascular status: blood pressure returned to baseline and stable Postop Assessment: no apparent nausea or vomiting Anesthetic complications: no   No notable events documented.   Last Vitals:  Vitals:   12/04/23 1815 12/04/23 1832  BP: (!) 110/39 (!) 133/53  Pulse: 65 67  Resp: 15 20  Temp:  (!) 36.1 C  SpO2: 96% 100%    Last Pain:  Vitals:   12/04/23 1832  TempSrc: Temporal  PainSc: 0-No pain                 Louie Boston

## 2023-12-04 NOTE — Brief Op Note (Signed)
12/04/2023  5:50 PM  PATIENT:  Lawrence [name]  73 y.o. male  PRE-OPERATIVE DIAGNOSIS:  osteomyelitis  POST-OPERATIVE DIAGNOSIS:  osteomyelitis  PROCEDURE:  Procedure(s): TRANSMETATARSAL AMPUTATION (Left) TENDON-ACHILLES LENGTHENING (Left) AMPUTATION TOE INTERPHALANGEAL 3RD (Right)  SURGEON:  Surgeons and Role:    Felecia Shelling, DPM - Primary  PHYSICIAN ASSISTANT:   ASSISTANTS: none   ANESTHESIA:   IV sedation  EBL:  minimal   BLOOD ADMINISTERED:none  DRAINS: none   LOCAL MEDICATIONS USED:  MARCAINE   , XYLOCAINE , and Amount: 20 ml  SPECIMEN:  No Specimen  DISPOSITION OF SPECIMEN:  N/A  COUNTS:  YES  TOURNIQUET:   Total Tourniquet Time Documented: Calf (Right) - 13 minutes Total: Calf (Right) - 13 minutes  Calf (Left) - 39 minutes Total: Calf (Left) - 39 minutes   DICTATION: .Reubin Milan Dictation  PLAN OF CARE: Discharge home after PACU  PATIENT DISPOSITION:  PACU - hemodynamically stable.   Delay start of Pharmacological VTE agent (>24hrs) due to surgical blood loss or risk of bleeding: no  Felecia Shelling, DPM [facility]  Dr. Felecia Shelling, DPM    [address] [place], [place] [address]                Office [phone]  Fax [phone]

## 2023-12-06 ENCOUNTER — Encounter: Payer: Self-pay | Admitting: Podiatry

## 2023-12-07 ENCOUNTER — Encounter: Payer: Self-pay | Admitting: Podiatry

## 2023-12-08 LAB — SURGICAL PATHOLOGY

## 2023-12-11 ENCOUNTER — Ambulatory Visit (INDEPENDENT_AMBULATORY_CARE_PROVIDER_SITE_OTHER): Payer: Medicare Other

## 2023-12-11 ENCOUNTER — Encounter: Payer: Medicare Other | Admitting: Podiatry

## 2023-12-11 ENCOUNTER — Ambulatory Visit (INDEPENDENT_AMBULATORY_CARE_PROVIDER_SITE_OTHER): Payer: Medicare Other | Admitting: Podiatry

## 2023-12-11 ENCOUNTER — Encounter: Payer: Self-pay | Admitting: Podiatry

## 2023-12-11 DIAGNOSIS — M21622 Bunionette of left foot: Secondary | ICD-10-CM

## 2023-12-11 DIAGNOSIS — L97512 Non-pressure chronic ulcer of other part of right foot with fat layer exposed: Secondary | ICD-10-CM | POA: Diagnosis not present

## 2023-12-11 DIAGNOSIS — M869 Osteomyelitis, unspecified: Secondary | ICD-10-CM

## 2023-12-11 DIAGNOSIS — Z9889 Other specified postprocedural states: Secondary | ICD-10-CM

## 2023-12-11 MED ORDER — CIPROFLOXACIN HCL 500 MG PO TABS: 500.0000 mg | ORAL_TABLET | Freq: Two times a day (BID) | ORAL | 0 refills | Status: DC

## 2023-12-11 MED ORDER — DOXYCYCLINE HYCLATE 100 MG PO TABS: 100.0000 mg | ORAL_TABLET | Freq: Two times a day (BID) | ORAL | 0 refills | Status: DC

## 2023-12-11 NOTE — Progress Notes (Signed)
 Chief Complaint  Patient presents with   Routine Post Op    POV #1, DOS 12/04/23,TRANSMETATARSAL AMPUTATION LT, TENDO ACHILLES LENGTHENING LT, PARTIAL TOE AMPUTATION RT THIRD The right feels fine but the left one feels like step in a nest of ants.  It is burning and tingling. My pain level right now is a three.    Subjective:  Patient presents today status post TMA with TAL left.  Partial toe amputation RT third.  DOS: 12/04/2023.  Doing well.  Dressings are clean dry and intact.  Minimal WBAT surgical shoes bilateral with a walker.  Past Medical History:  Diagnosis Date   Allergies    Anginal pain (HCC)    Bilateral carotid artery disease (HCC)    Breast pain, left 08/29/2022   CAD (coronary artery disease) 07/2007   a.) s/p NSTEMI 07/2007 - sig multi-vessel CAD not amenable to PCI; b.) LHC 06/11/2016: multi vessel CAD not amenable to PCI --> refer to CVTS; c.) s/p 4v CABG 06/27/2016; d.) LHC 06/09/2023: 75% mLM, 80% o-pLAD, 60% mLAD, 100% p-mLCx, 100% OM1, 95% pRCA, 100% mRCA, 50% m-dLAD. LIMA-LAD and SVG-OM1 patent. CTO SVG-RCA with L-R collaterals - med mgmt   Carpal tunnel syndrome on left    a.) s/p release 04/2016   CKD (chronic kidney disease), stage III (HCC)    COVID-19 12/09/2022   Diabetic peripheral neuropathy (HCC)    Diastolic dysfunction    a.) TTE 06/25/2016: EF 60-654%. no RWMAs, G1DD, mild AoV annular calc; b.) TTE 03/19/2017: EF 50-55%, no RWMAs, mild MR; c.) TTE 07/22/2019: EF 55-60%, midl LVH, no RWMAs, triv MR, mild TR   Dizziness 04/30/2021   Dyspnea    GERD (gastroesophageal reflux disease)    Glaucoma    Headache    History of bilateral cataract extraction    HLD (hyperlipidemia)    HTN (hypertension)    Long term current use of clopidogrel     Long-term use of aspirin  therapy    MRSA (methicillin resistant staph aureus) culture positive    2006 - 2008   Myocardial infarction (HCC) 04/04/2016   NSTEMI (non-ST elevated myocardial infarction) (HCC)  07/27/2007   a.) LHC 07/30/2007: 30% mLAD, 99% D1, 50% pLCx, 40% mLCx, 70/75/85% dLCx, 50% OM1, 75/80/80 OM2, 50% pRCA, 60% mRCA, 85% dRCA --> not amenable to PCI   OSA on CPAP    Osteoarthritis    PAD (peripheral artery disease) (HCC)    a. 03/2022 Lower Ext Angio: No signif Ao-iliac dzs. R PT diff dzs and occluded above ankle. R Pedal arch intact w/ excellent antegrade flow provided by R AT-->no indication for revasc.   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   a.) LIMA-LAD, SVG-RCA, seq SVG-OM1-OM2   S/P tube myringotomy    Skin cancer, basal cell    Skin ulcer of right great toe, unspecified ulcer stage (HCC)    T2DM (type 2 diabetes mellitus) (HCC)    Wears glasses    Wears partial dentures    top    Past Surgical History:  Procedure Laterality Date   ABDOMINAL AORTOGRAM W/LOWER EXTREMITY N/A 03/26/2022   Procedure: ABDOMINAL AORTOGRAM W/LOWER EXTREMITY;  Surgeon: Darron Deatrice LABOR, MD;  [facility];  Service: Cardiovascular;  Laterality: N/A;   AMPUTATION TOE Bilateral 06/26/2023   Procedure: AMPUTATION TOE MPJ OF THE JOINT,PARTIAL TOE AMPUTUATION;  Surgeon: Janit Thresa HERO, DPM;  Location: [facility];  Service: Orthopedics/Podiatry;  Laterality: Bilateral;  AMPUTATION TOE Right 12/04/2023   Procedure: AMPUTATION TOE INTERPHALANGEAL 3RD;  Surgeon: Janit Thresa HERO, DPM;  Location: [facility];  Service: Orthopedics/Podiatry;  Laterality: Right;   APPENDECTOMY     CARDIAC CATHETERIZATION N/A 06/11/2016   Procedure: Left Heart Cath and Coronary Angiography;  Surgeon: Wolm JINNY Rhyme, MD;  Location: [facility];  Service: Cardiovascular;  Laterality: N/A;   CARPAL TUNNEL RELEASE Left 04/24/2016   Procedure: CARPAL TUNNEL RELEASE;  Surgeon: Ozell Flake, MD;  Location: [facility];  Service: Orthopedics;  Laterality: Left;   CATARACT EXTRACTION W/ INTRAOCULAR LENS  IMPLANT, BILATERAL Bilateral    CATARACT EXTRACTION, BILATERAL     R  eye 07/16/12, L eye 08/13/12 - with lens implant in both eyes   CORONARY ARTERY BYPASS GRAFT N/A 06/27/2016   Procedure: CORONARY ARTERY BYPASS GRAFTING times four using left internal mammary artery and right leg saphenous vein;  Surgeon: Maude Fleeta Ochoa, MD;  Location: [facility] OR;  Service: Open Heart Surgery;  Laterality: N/A;   FOREARM FRACTURE SURGERY Left 1961   KNEE ARTHROPLASTY Left 02/11/2016   Procedure: COMPUTER ASSISTED TOTAL KNEE ARTHROPLASTY;  Surgeon: Lynwood SHAUNNA Hue, MD;  Location: [facility];  Service: Orthopedics;  Laterality: Left;   KNEE ARTHROSCOPY Right 2000   LEFT HEART CATH AND CORONARY ANGIOGRAPHY Left 07/30/2007   Procedure: LEFT HEART CATH AND CORONARY ANGIOGRAPHY; Location: [facility]; Surgeon: Wolm Rhyme, MD   LEFT HEART CATH AND CORS/GRAFTS ANGIOGRAPHY Left 01/13/2017   Procedure: Left Heart Cath and Cors/Grafts Angiography;  Surgeon: Wolm JINNY Rhyme, MD;  Location: [facility];  Service: Cardiovascular;  Laterality: Left;   LEFT HEART CATH AND CORS/GRAFTS ANGIOGRAPHY Left 06/09/2023   Procedure: LEFT HEART CATH AND CORS/GRAFTS ANGIOGRAPHY;  Surgeon: Ammon Blunt, MD;  Location: [facility];  Service: Cardiovascular;  Laterality: Left;   METATARSAL HEAD EXCISION Right 03/21/2020   Procedure: METATARSAL HEAD RESECTION RIGHT AND DEBRIDEMENT OF ULCER;  Surgeon: Janit Thresa HERO, DPM;  Location: [facility];  Service: Podiatry;  Laterality: Right;   METATARSAL HEAD EXCISION Left 09/15/2023   Procedure: METATARSAL HEAD EXCISION;  Surgeon: Janit Thresa HERO, DPM;  Location: [facility];  Service: Orthopedics/Podiatry;  Laterality: Left;   MULTIPLE TOOTH EXTRACTIONS     TEE WITHOUT CARDIOVERSION N/A 06/27/2016   Procedure: TRANSESOPHAGEAL ECHOCARDIOGRAM (TEE);  Surgeon: Maude Fleeta Ochoa, MD;  Location: [facility] OR;  Service: Open Heart Surgery;  Laterality: N/A;   TENDON LENGTHENING Left 12/04/2023   Procedure: TENDON-ACHILLES LENGTHENING;  Surgeon: Janit Thresa HERO, DPM;   Location: [facility];  Service: Orthopedics/Podiatry;  Laterality: Left;   TRANSMETATARSAL AMPUTATION Left 12/04/2023   Procedure: TRANSMETATARSAL AMPUTATION;  Surgeon: Janit Thresa HERO, DPM;  Location: [facility];  Service: Orthopedics/Podiatry;  Laterality: Left;    Allergies  Allergen Reactions   Latex Other (See Comments)    Blister and burning (29M insulin  pump latex band aid )   Nitroglycerin  Nausea Only and Other (See Comments)    Patches only - headache    Statins Other (See Comments)    BODY PAIN, Pt taking Crestor *      Fluvastatin Other (See Comments)    Muscle pain   Fluvastatin Sodium Other (See Comments)    Myalgias   Percocet [Oxycodone -Acetaminophen ] Itching   Rosuvastatin  Other (See Comments)    Muscle pain, Myalgias   Simvastatin Other (See Comments)    Muscle pain, Myalgias   Lipitor [Atorvastatin] Other (See Comments) and Rash    BODY PAIN   Lisinopril  Other (See  Comments) and Nausea Only    Light headed, dizzy Other reaction(s): Dizziness   Zetia [Ezetimibe] Other (See Comments)    Muscle Pain    RT foot 12/11/2023 POV #1  LT foot 12/11/2023 POV #1  Objective/Physical Exam Neurovascular status intact.  Incisions are coapted.  There is some moderate erythema to the distal amputation stump of the left foot with mild maceration.  No active bleeding.  Please see above noted photo.  No malodor.  No purulence.  Radiographic Exam LT foot 12/11/2023: Osteotomies clean.  No gas within the tissues.  Radiographic exam RT foot 12/11/2023: Interval partial amputation of the right third toe noted compared to prior x-rays.  Stable.  No gas within the tissues.  Assessment: 1. s/p TMA w/ TAL LT. Partial 3rd toe amputation RT. DOS: 12/04/2023   Plan of Care:  -Patient was evaluated. X-rays reviewed - Dressings changed.  Leave clean dry and intact x 1 week.  Betadine wet-to-dry -Minimal WBAT surgical shoes bilateral with a walker -Due to the mild erythema to the distal  amputation stump of the left foot I did call and 2 prescriptions for doxycycline  100 mg BID and Cipro  500 mg BID x 10 days -Return to clinic 1 week   Thresa EMERSON Sar, DPM [facility]  Dr. Thresa EMERSON Sar, DPM    [address] [place], [place] [address]                Office [phone]  Fax [phone]

## 2023-12-18 ENCOUNTER — Ambulatory Visit: Payer: Medicare Other | Admitting: Podiatry

## 2023-12-18 ENCOUNTER — Encounter: Payer: Self-pay | Admitting: Podiatry

## 2023-12-18 ENCOUNTER — Telehealth: Payer: Self-pay

## 2023-12-18 DIAGNOSIS — M869 Osteomyelitis, unspecified: Secondary | ICD-10-CM

## 2023-12-18 MED ORDER — DOXYCYCLINE HYCLATE 100 MG PO TABS: 100.0000 mg | ORAL_TABLET | Freq: Two times a day (BID) | ORAL | 0 refills | Status: DC

## 2023-12-18 MED ORDER — CIPROFLOXACIN HCL 500 MG PO TABS: 500.0000 mg | ORAL_TABLET | Freq: Two times a day (BID) | ORAL | 0 refills | Status: AC

## 2023-12-18 NOTE — Progress Notes (Signed)
Chief Complaint  Patient presents with   Routine Post Op    POV #2, DOS 12/04/23,TRANSMETATARSAL AMPUTATION LT, TENDO ACHILLES LENGTHENING LT, PARTIAL TOE AMPUTATION RT THIRD Wife stated, "They look good to me.  The red spot has gone away." "Where he cut the achilles has been burning like crazy and waking me up at night.  That nerve from the big toe has been aching too.  I was up three hours last night."    Subjective:  Patient presents today status post TMA with TAL left.  Partial toe amputation RT third.  DOS: 12/04/2023.  Doing well.  Dressings are clean dry and intact.  Minimal WBAT surgical shoes bilateral with a walker.  Past Medical History:  Diagnosis Date   Allergies    Anginal pain (HCC)    Bilateral carotid artery disease (HCC)    Breast pain, left 08/29/2022   CAD (coronary artery disease) 07/2007   a.) s/p NSTEMI 07/2007 - sig multi-vessel CAD not amenable to PCI; b.) LHC 06/11/2016: multi vessel CAD not amenable to PCI --> refer to CVTS; c.) s/p 4v CABG 06/27/2016; d.) LHC 06/09/2023: 75% mLM, 80% o-pLAD, 60% mLAD, 100% p-mLCx, 100% OM1, 95% pRCA, 100% mRCA, 50% m-dLAD. LIMA-LAD and SVG-OM1 patent. CTO SVG-RCA with L-R collaterals - med mgmt   Carpal tunnel syndrome on left    a.) s/p release 04/2016   CKD (chronic kidney disease), stage III (HCC)    COVID-19 12/09/2022   Diabetic peripheral neuropathy (HCC)    Diastolic dysfunction    a.) TTE 06/25/2016: EF 60-654%. no RWMAs, G1DD, mild AoV annular calc; b.) TTE 03/19/2017: EF 50-55%, no RWMAs, mild MR; c.) TTE 07/22/2019: EF 55-60%, midl LVH, no RWMAs, triv MR, mild TR   Dizziness 04/30/2021   Dyspnea    GERD (gastroesophageal reflux disease)    Glaucoma    Headache    History of bilateral cataract extraction    HLD (hyperlipidemia)    HTN (hypertension)    Long term current use of clopidogrel    Long-term use of aspirin therapy    MRSA (methicillin resistant staph aureus) culture positive    2006 - 2008    Myocardial infarction (HCC) 04/04/2016   NSTEMI (non-ST elevated myocardial infarction) (HCC) 07/27/2007   a.) LHC 07/30/2007: 30% mLAD, 99% D1, 50% pLCx, 40% mLCx, 70/75/85% dLCx, 50% OM1, 75/80/80 OM2, 50% pRCA, 60% mRCA, 85% dRCA --> not amenable to PCI   OSA on CPAP    Osteoarthritis    PAD (peripheral artery disease) (HCC)    a. 03/2022 Lower Ext Angio: No signif Ao-iliac dzs. R PT diff dzs and occluded above ankle. R Pedal arch intact w/ excellent antegrade flow provided by R AT-->no indication for revasc.   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   a.) LIMA-LAD, SVG-RCA, seq SVG-OM1-OM2   S/P tube myringotomy    Skin cancer, basal cell    Skin ulcer of right great toe, unspecified ulcer stage (HCC)    T2DM (type 2 diabetes mellitus) (HCC)    Wears glasses    Wears partial dentures    top    Past Surgical History:  Procedure Laterality Date   ABDOMINAL AORTOGRAM W/LOWER EXTREMITY N/A 03/26/2022   Procedure: ABDOMINAL AORTOGRAM W/LOWER EXTREMITY;  Surgeon: Iran Ouch, MD;  Location: [facility];  Service: Cardiovascular;  Laterality: N/A;   AMPUTATION TOE Bilateral 06/26/2023   Procedure: AMPUTATION TOE MPJ OF THE JOINT,PARTIAL  TOE AMPUTUATION;  Surgeon: Felecia Shelling, DPM;  Location: [facility];  Service: Orthopedics/Podiatry;  Laterality: Bilateral;   AMPUTATION TOE Right 12/04/2023   Procedure: AMPUTATION TOE INTERPHALANGEAL 3RD;  Surgeon: Felecia Shelling, DPM;  Location: [facility];  Service: Orthopedics/Podiatry;  Laterality: Right;   APPENDECTOMY     CARDIAC CATHETERIZATION N/A 06/11/2016   Procedure: Left Heart Cath and Coronary Angiography;  Surgeon: Lamar Blinks, MD;  Location: [facility];  Service: Cardiovascular;  Laterality: N/A;   CARPAL TUNNEL RELEASE Left 04/24/2016   Procedure: CARPAL TUNNEL RELEASE;  Surgeon: Kennedy Bucker, MD;  Location: [facility];  Service: Orthopedics;  Laterality: Left;   CATARACT  EXTRACTION W/ INTRAOCULAR LENS  IMPLANT, BILATERAL Bilateral    CATARACT EXTRACTION, BILATERAL     R eye 07/16/12, L eye 08/13/12 - with lens implant in both eyes   CORONARY ARTERY BYPASS GRAFT N/A 06/27/2016   Procedure: CORONARY ARTERY BYPASS GRAFTING times four using left internal mammary artery and right leg saphenous vein;  Surgeon: Kerin Perna, MD;  Location: [facility] OR;  Service: Open Heart Surgery;  Laterality: N/A;   FOREARM FRACTURE SURGERY Left 1961   KNEE ARTHROPLASTY Left 02/11/2016   Procedure: COMPUTER ASSISTED TOTAL KNEE ARTHROPLASTY;  Surgeon: Donato Heinz, MD;  Location: [facility];  Service: Orthopedics;  Laterality: Left;   KNEE ARTHROSCOPY Right 2000   LEFT HEART CATH AND CORONARY ANGIOGRAPHY Left 07/30/2007   Procedure: LEFT HEART CATH AND CORONARY ANGIOGRAPHY; Location: [facility]; Surgeon: Arnoldo Hooker, MD   LEFT HEART CATH AND CORS/GRAFTS ANGIOGRAPHY Left 01/13/2017   Procedure: Left Heart Cath and Cors/Grafts Angiography;  Surgeon: Lamar Blinks, MD;  Location: [facility];  Service: Cardiovascular;  Laterality: Left;   LEFT HEART CATH AND CORS/GRAFTS ANGIOGRAPHY Left 06/09/2023   Procedure: LEFT HEART CATH AND CORS/GRAFTS ANGIOGRAPHY;  Surgeon: Marcina Millard, MD;  Location: [facility];  Service: Cardiovascular;  Laterality: Left;   METATARSAL HEAD EXCISION Right 03/21/2020   Procedure: METATARSAL HEAD RESECTION RIGHT AND DEBRIDEMENT OF ULCER;  Surgeon: Felecia Shelling, DPM;  Location: [facility];  Service: Podiatry;  Laterality: Right;   METATARSAL HEAD EXCISION Left 09/15/2023   Procedure: METATARSAL HEAD EXCISION;  Surgeon: Felecia Shelling, DPM;  Location: [facility];  Service: Orthopedics/Podiatry;  Laterality: Left;   MULTIPLE TOOTH EXTRACTIONS     TEE WITHOUT CARDIOVERSION N/A 06/27/2016   Procedure: TRANSESOPHAGEAL ECHOCARDIOGRAM (TEE);  Surgeon: Kerin Perna, MD;  Location: [facility] OR;  Service: Open Heart Surgery;  Laterality: N/A;   TENDON  LENGTHENING Left 12/04/2023   Procedure: TENDON-ACHILLES LENGTHENING;  Surgeon: Felecia Shelling, DPM;  Location: [facility];  Service: Orthopedics/Podiatry;  Laterality: Left;   TRANSMETATARSAL AMPUTATION Left 12/04/2023   Procedure: TRANSMETATARSAL AMPUTATION;  Surgeon: Felecia Shelling, DPM;  Location: [facility];  Service: Orthopedics/Podiatry;  Laterality: Left;    Allergies  Allergen Reactions   Latex Other (See Comments)    Blister and burning (51M insulin pump latex band aid )   Nitroglycerin Nausea Only and Other (See Comments)    Patches only - headache    Statins Other (See Comments)    BODY PAIN, Pt taking Crestor*      Fluvastatin Other (See Comments)    Muscle pain   Fluvastatin Sodium Other (See Comments)    Myalgias   Percocet [Oxycodone-Acetaminophen] Itching   Rosuvastatin Other (See Comments)    Muscle pain, Myalgias   Simvastatin Other (See Comments)    Muscle pain,  Myalgias   Lipitor [Atorvastatin] Other (See Comments) and Rash    BODY PAIN   Lisinopril Other (See Comments) and Nausea Only    Light headed, dizzy Other reaction(s): Dizziness   Zetia [Ezetimibe] Other (See Comments)    Muscle Pain    RT foot 12/11/2023 POV #1  LT foot 12/11/2023 POV #1  Objective/Physical Exam Neurovascular status intact.  Incisions are coapted.  Overall significant improvement.  There is some very mild erythema to the distal amputation stump of the left foot with the maceration noted previously completely resolved.  No active bleeding.  Please see above noted photo.  No malodor.  No purulence.  Radiographic Exam LT foot 12/11/2023: Osteotomies clean.  No gas within the tissues.  Radiographic exam RT foot 12/11/2023: Interval partial amputation of the right third toe noted compared to prior x-rays.  Stable.  No gas within the tissues.  Assessment: 1. s/p TMA w/ TAL LT. Partial 3rd toe amputation RT. DOS: 12/04/2023   Plan of Care:  -Patient was evaluated.  - Dressings changed.   Leave clean dry and intact x 1 week.  Betadine wet-to-dry - Continue minimal WBAT surgical shoes bilateral with a walker -Due to the mild erythema to the distal amputation stump refill prescription for doxycycline 100 mg BID and Cipro 500 mg BID x 10 days -Return to clinic 2 weeks suture and staple removal   Felecia Shelling, DPM [facility]  Dr. Felecia Shelling, DPM    [address] [place], [place] [address]                Office [phone]  Fax [phone]

## 2023-12-18 NOTE — Telephone Encounter (Signed)
Re-opened cancelled order as Treating Dr. Was incorrect and then cancelled in January  Kerby Nora MD was added to new Safe [facility]. Order will FU in two weeks

## 2023-12-25 ENCOUNTER — Encounter: Payer: Medicare Other | Admitting: Podiatry

## 2024-01-01 ENCOUNTER — Encounter: Payer: Medicare Other | Admitting: Podiatry

## 2024-01-05 ENCOUNTER — Other Ambulatory Visit: Payer: Self-pay | Admitting: Podiatry

## 2024-01-05 ENCOUNTER — Ambulatory Visit (INDEPENDENT_AMBULATORY_CARE_PROVIDER_SITE_OTHER): Payer: Medicare Other | Admitting: Podiatry

## 2024-01-05 ENCOUNTER — Ambulatory Visit (INDEPENDENT_AMBULATORY_CARE_PROVIDER_SITE_OTHER)

## 2024-01-05 ENCOUNTER — Encounter: Payer: Self-pay | Admitting: Podiatry

## 2024-01-05 DIAGNOSIS — L97512 Non-pressure chronic ulcer of other part of right foot with fat layer exposed: Secondary | ICD-10-CM | POA: Diagnosis not present

## 2024-01-05 DIAGNOSIS — Z89421 Acquired absence of other right toe(s): Secondary | ICD-10-CM

## 2024-01-05 DIAGNOSIS — Z9889 Other specified postprocedural states: Secondary | ICD-10-CM

## 2024-01-05 DIAGNOSIS — L03116 Cellulitis of left lower limb: Secondary | ICD-10-CM | POA: Diagnosis not present

## 2024-01-05 NOTE — Addendum Note (Signed)
 Addended by: Jamelle Rushing on: 01/05/2024 10:52 AM   Modules accepted: Orders

## 2024-01-05 NOTE — Progress Notes (Signed)
 Chief Complaint  Patient presents with   Routine Post Op    POV #3, DOS 12/04/23,TRANSMETATARSAL AMPUTATION LT, TENDO ACHILLES LENGTHENING LT, PARTIAL TOE AMPUTATION pt states the left is still causing pain states that at night it feels like a ton of ants is crawling all over his foot, and his right foot  is ok pain comes and goes.    Subjective:  Patient presents today status post TMA with TAL left.  Partial toe amputation RT third.  DOS: 12/04/2023.    Past Medical History:  Diagnosis Date   Allergies    Anginal pain (HCC)    Bilateral carotid artery disease (HCC)    Breast pain, left 08/29/2022   CAD (coronary artery disease) 07/2007   a.) s/p NSTEMI 07/2007 - sig multi-vessel CAD not amenable to PCI; b.) LHC 06/11/2016: multi vessel CAD not amenable to PCI --> refer to CVTS; c.) s/p 4v CABG 06/27/2016; d.) LHC 06/09/2023: 75% mLM, 80% o-pLAD, 60% mLAD, 100% p-mLCx, 100% OM1, 95% pRCA, 100% mRCA, 50% m-dLAD. LIMA-LAD and SVG-OM1 patent. CTO SVG-RCA with L-R collaterals - med mgmt   Carpal tunnel syndrome on left    a.) s/p release 04/2016   CKD (chronic kidney disease), stage III (HCC)    COVID-19 12/09/2022   Diabetic peripheral neuropathy (HCC)    Diastolic dysfunction    a.) TTE 06/25/2016: EF 60-654%. no RWMAs, G1DD, mild AoV annular calc; b.) TTE 03/19/2017: EF 50-55%, no RWMAs, mild MR; c.) TTE 07/22/2019: EF 55-60%, midl LVH, no RWMAs, triv MR, mild TR   Dizziness 04/30/2021   Dyspnea    GERD (gastroesophageal reflux disease)    Glaucoma    Headache    History of bilateral cataract extraction    HLD (hyperlipidemia)    HTN (hypertension)    Long term current use of clopidogrel    Long-term use of aspirin therapy    MRSA (methicillin resistant staph aureus) culture positive    2006 - 2008   Myocardial infarction (HCC) 04/04/2016   NSTEMI (non-ST elevated myocardial infarction) (HCC) 07/27/2007   a.) LHC 07/30/2007: 30% mLAD, 99% D1, 50% pLCx, 40% mLCx, 70/75/85% dLCx,  50% OM1, 75/80/80 OM2, 50% pRCA, 60% mRCA, 85% dRCA --> not amenable to PCI   OSA on CPAP    Osteoarthritis    PAD (peripheral artery disease) (HCC)    a. 03/2022 Lower Ext Angio: No signif Ao-iliac dzs. R PT diff dzs and occluded above ankle. R Pedal arch intact w/ excellent antegrade flow provided by R AT-->no indication for revasc.   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   a.) LIMA-LAD, SVG-RCA, seq SVG-OM1-OM2   S/P tube myringotomy    Skin cancer, basal cell    Skin ulcer of right great toe, unspecified ulcer stage (HCC)    T2DM (type 2 diabetes mellitus) (HCC)    Wears glasses    Wears partial dentures    top    Past Surgical History:  Procedure Laterality Date   ABDOMINAL AORTOGRAM W/LOWER EXTREMITY N/A 03/26/2022   Procedure: ABDOMINAL AORTOGRAM W/LOWER EXTREMITY;  Surgeon: Iran Ouch, MD;  Location: [facility];  Service: Cardiovascular;  Laterality: N/A;   AMPUTATION TOE Bilateral 06/26/2023   Procedure: AMPUTATION TOE MPJ OF THE JOINT,PARTIAL TOE AMPUTUATION;  Surgeon: Felecia Shelling, DPM;  Location: [facility];  Service: Orthopedics/Podiatry;  Laterality: Bilateral;   AMPUTATION TOE Right 12/04/2023   Procedure: AMPUTATION TOE INTERPHALANGEAL 3RD;  Surgeon: Felecia Shelling, DPM;  Location: [facility];  Service: Orthopedics/Podiatry;  Laterality: Right;   APPENDECTOMY     CARDIAC CATHETERIZATION N/A 06/11/2016   Procedure: Left Heart Cath and Coronary Angiography;  Surgeon: Lamar Blinks, MD;  Location: [facility];  Service: Cardiovascular;  Laterality: N/A;   CARPAL TUNNEL RELEASE Left 04/24/2016   Procedure: CARPAL TUNNEL RELEASE;  Surgeon: Kennedy Bucker, MD;  Location: [facility];  Service: Orthopedics;  Laterality: Left;   CATARACT EXTRACTION W/ INTRAOCULAR LENS  IMPLANT, BILATERAL Bilateral    CATARACT EXTRACTION, BILATERAL     R eye 07/16/12, L eye 08/13/12 - with lens implant in both eyes   CORONARY ARTERY BYPASS  GRAFT N/A 06/27/2016   Procedure: CORONARY ARTERY BYPASS GRAFTING times four using left internal mammary artery and right leg saphenous vein;  Surgeon: Kerin Perna, MD;  Location: [facility] OR;  Service: Open Heart Surgery;  Laterality: N/A;   FOREARM FRACTURE SURGERY Left 1961   KNEE ARTHROPLASTY Left 02/11/2016   Procedure: COMPUTER ASSISTED TOTAL KNEE ARTHROPLASTY;  Surgeon: Donato Heinz, MD;  Location: [facility];  Service: Orthopedics;  Laterality: Left;   KNEE ARTHROSCOPY Right 2000   LEFT HEART CATH AND CORONARY ANGIOGRAPHY Left 07/30/2007   Procedure: LEFT HEART CATH AND CORONARY ANGIOGRAPHY; Location: [facility]; Surgeon: Arnoldo Hooker, MD   LEFT HEART CATH AND CORS/GRAFTS ANGIOGRAPHY Left 01/13/2017   Procedure: Left Heart Cath and Cors/Grafts Angiography;  Surgeon: Lamar Blinks, MD;  Location: [facility];  Service: Cardiovascular;  Laterality: Left;   LEFT HEART CATH AND CORS/GRAFTS ANGIOGRAPHY Left 06/09/2023   Procedure: LEFT HEART CATH AND CORS/GRAFTS ANGIOGRAPHY;  Surgeon: Marcina Millard, MD;  Location: [facility];  Service: Cardiovascular;  Laterality: Left;   METATARSAL HEAD EXCISION Right 03/21/2020   Procedure: METATARSAL HEAD RESECTION RIGHT AND DEBRIDEMENT OF ULCER;  Surgeon: Felecia Shelling, DPM;  Location: [facility];  Service: Podiatry;  Laterality: Right;   METATARSAL HEAD EXCISION Left 09/15/2023   Procedure: METATARSAL HEAD EXCISION;  Surgeon: Felecia Shelling, DPM;  Location: [facility];  Service: Orthopedics/Podiatry;  Laterality: Left;   MULTIPLE TOOTH EXTRACTIONS     TEE WITHOUT CARDIOVERSION N/A 06/27/2016   Procedure: TRANSESOPHAGEAL ECHOCARDIOGRAM (TEE);  Surgeon: Kerin Perna, MD;  Location: [facility] OR;  Service: Open Heart Surgery;  Laterality: N/A;   TENDON LENGTHENING Left 12/04/2023   Procedure: TENDON-ACHILLES LENGTHENING;  Surgeon: Felecia Shelling, DPM;  Location: [facility];  Service: Orthopedics/Podiatry;  Laterality: Left;   TRANSMETATARSAL  AMPUTATION Left 12/04/2023   Procedure: TRANSMETATARSAL AMPUTATION;  Surgeon: Felecia Shelling, DPM;  Location: [facility];  Service: Orthopedics/Podiatry;  Laterality: Left;    Allergies  Allergen Reactions   Latex Other (See Comments)    Blister and burning (42M insulin pump latex band aid )   Nitroglycerin Nausea Only and Other (See Comments)    Patches only - headache    Statins Other (See Comments)    BODY PAIN, Pt taking Crestor*      Fluvastatin Other (See Comments)    Muscle pain   Fluvastatin Sodium Other (See Comments)    Myalgias   Percocet [Oxycodone-Acetaminophen] Itching   Rosuvastatin Other (See Comments)    Muscle pain, Myalgias   Simvastatin Other (See Comments)    Muscle pain, Myalgias   Lipitor [Atorvastatin] Other (See Comments) and Rash    BODY PAIN   Lisinopril Other (See Comments) and Nausea Only    Light headed, dizzy Other reaction(s):  Dizziness   Zetia [Ezetimibe] Other (See Comments)    Muscle Pain    RT foot 12/11/2023 POV #1  LT foot 12/11/2023 POV #1   RT third toe 01/05/2024  LT foot 01/05/2024  Objective/Physical Exam Neurovascular status intact.  Capillary refill immediate to the amputation stump.  There continues to be some maceration and drainage at the incision site.  No foul odor.  Serous drainage.  No purulence.  Maceration noted at the amputation stump.  Please see above noted photo  Radiographic Exam LT foot 01/05/2024: Unchanged.  Clean osteotomy is noted through metatarsals 1-5.  No gas within the tissues.  Radiographic exam RT foot 01/05/2024: Unchanged.  Assessment: 1. s/p TMA w/ TAL LT. Partial 3rd toe amputation RT. DOS: 12/04/2023 2.  Cellulitis left foot   Plan of Care:  -Patient was evaluated.  X-rays reviewed - Dressings changed.  Recommend Betadine wet-to-dry dressings daily. - Continue minimal WBAT surgical shoes bilateral with a walker - Patient recently completed combination doxycycline and ciprofloxacin x 10 days.   Completed on 01/03/2024. -Cultures taken and sent to pathology for culture and sensitivity -Return to clinic 2 weeks   Felecia Shelling, DPM [facility]  Dr. Felecia Shelling, DPM    [address] [place], [place] [address]                Office [phone]  Fax [phone]

## 2024-01-07 LAB — WOUND CULTURE: Organism ID, Bacteria: NONE SEEN

## 2024-01-12 ENCOUNTER — Ambulatory Visit (INDEPENDENT_AMBULATORY_CARE_PROVIDER_SITE_OTHER): Admitting: Podiatry

## 2024-01-12 ENCOUNTER — Encounter: Payer: Self-pay | Admitting: Podiatry

## 2024-01-12 DIAGNOSIS — Z89432 Acquired absence of left foot: Secondary | ICD-10-CM

## 2024-01-12 DIAGNOSIS — Z89421 Acquired absence of other right toe(s): Secondary | ICD-10-CM

## 2024-01-12 NOTE — Progress Notes (Signed)
 Chief Complaint  Patient presents with   Routine Post Op    POV #3, DOS 12/04/23,TRANSMETATARSAL AMPUTATION LT, TENDO ACHILLES LENGTHENING LT, PARTIAL TOE AMPUTATION RT THIRD Wife stated, "It seems to be doing better.  I went ahead and pulled the bandaid off."    Subjective:  Patient presents today status post TMA with TAL left.  Partial toe amputation RT third.  DOS: 12/04/2023.  Patient has noticed improvement since last visit.  No new complaints  Past Medical History:  Diagnosis Date   Allergies    Anginal pain (HCC)    Bilateral carotid artery disease (HCC)    Breast pain, left 08/29/2022   CAD (coronary artery disease) 07/2007   a.) s/p NSTEMI 07/2007 - sig multi-vessel CAD not amenable to PCI; b.) LHC 06/11/2016: multi vessel CAD not amenable to PCI --> refer to CVTS; c.) s/p 4v CABG 06/27/2016; d.) LHC 06/09/2023: 75% mLM, 80% o-pLAD, 60% mLAD, 100% p-mLCx, 100% OM1, 95% pRCA, 100% mRCA, 50% m-dLAD. LIMA-LAD and SVG-OM1 patent. CTO SVG-RCA with L-R collaterals - med mgmt   Carpal tunnel syndrome on left    a.) s/p release 04/2016   CKD (chronic kidney disease), stage III (HCC)    COVID-19 12/09/2022   Diabetic peripheral neuropathy (HCC)    Diastolic dysfunction    a.) TTE 06/25/2016: EF 60-654%. no RWMAs, G1DD, mild AoV annular calc; b.) TTE 03/19/2017: EF 50-55%, no RWMAs, mild MR; c.) TTE 07/22/2019: EF 55-60%, midl LVH, no RWMAs, triv MR, mild TR   Dizziness 04/30/2021   Dyspnea    GERD (gastroesophageal reflux disease)    Glaucoma    Headache    History of bilateral cataract extraction    HLD (hyperlipidemia)    HTN (hypertension)    Long term current use of clopidogrel    Long-term use of aspirin therapy    MRSA (methicillin resistant staph aureus) culture positive    2006 - 2008   Myocardial infarction (HCC) 04/04/2016   NSTEMI (non-ST elevated myocardial infarction) (HCC) 07/27/2007   a.) LHC 07/30/2007: 30% mLAD, 99% D1, 50% pLCx, 40% mLCx, 70/75/85% dLCx, 50%  OM1, 75/80/80 OM2, 50% pRCA, 60% mRCA, 85% dRCA --> not amenable to PCI   OSA on CPAP    Osteoarthritis    PAD (peripheral artery disease) (HCC)    a. 03/2022 Lower Ext Angio: No signif Ao-iliac dzs. R PT diff dzs and occluded above ankle. R Pedal arch intact w/ excellent antegrade flow provided by R AT-->no indication for revasc.   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   a.) LIMA-LAD, SVG-RCA, seq SVG-OM1-OM2   S/P tube myringotomy    Skin cancer, basal cell    Skin ulcer of right great toe, unspecified ulcer stage (HCC)    T2DM (type 2 diabetes mellitus) (HCC)    Wears glasses    Wears partial dentures    top    Past Surgical History:  Procedure Laterality Date   ABDOMINAL AORTOGRAM W/LOWER EXTREMITY N/A 03/26/2022   Procedure: ABDOMINAL AORTOGRAM W/LOWER EXTREMITY;  Surgeon: Iran Ouch, MD;  Location: [facility];  Service: Cardiovascular;  Laterality: N/A;   AMPUTATION TOE Bilateral 06/26/2023   Procedure: AMPUTATION TOE MPJ OF THE JOINT,PARTIAL TOE AMPUTUATION;  Surgeon: Felecia Shelling, DPM;  Location: [facility];  Service: Orthopedics/Podiatry;  Laterality: Bilateral;   AMPUTATION TOE Right 12/04/2023   Procedure: AMPUTATION TOE INTERPHALANGEAL 3RD;  Surgeon: Felecia Shelling, DPM;  Location:  [facility];  Service: Orthopedics/Podiatry;  Laterality: Right;   APPENDECTOMY     CARDIAC CATHETERIZATION N/A 06/11/2016   Procedure: Left Heart Cath and Coronary Angiography;  Surgeon: Lamar Blinks, MD;  Location: [facility];  Service: Cardiovascular;  Laterality: N/A;   CARPAL TUNNEL RELEASE Left 04/24/2016   Procedure: CARPAL TUNNEL RELEASE;  Surgeon: Kennedy Bucker, MD;  Location: [facility];  Service: Orthopedics;  Laterality: Left;   CATARACT EXTRACTION W/ INTRAOCULAR LENS  IMPLANT, BILATERAL Bilateral    CATARACT EXTRACTION, BILATERAL     R eye 07/16/12, L eye 08/13/12 - with lens implant in both eyes   CORONARY ARTERY BYPASS  GRAFT N/A 06/27/2016   Procedure: CORONARY ARTERY BYPASS GRAFTING times four using left internal mammary artery and right leg saphenous vein;  Surgeon: Kerin Perna, MD;  Location: [facility] OR;  Service: Open Heart Surgery;  Laterality: N/A;   FOREARM FRACTURE SURGERY Left 1961   KNEE ARTHROPLASTY Left 02/11/2016   Procedure: COMPUTER ASSISTED TOTAL KNEE ARTHROPLASTY;  Surgeon: Donato Heinz, MD;  Location: [facility];  Service: Orthopedics;  Laterality: Left;   KNEE ARTHROSCOPY Right 2000   LEFT HEART CATH AND CORONARY ANGIOGRAPHY Left 07/30/2007   Procedure: LEFT HEART CATH AND CORONARY ANGIOGRAPHY; Location: [facility]; Surgeon: Arnoldo Hooker, MD   LEFT HEART CATH AND CORS/GRAFTS ANGIOGRAPHY Left 01/13/2017   Procedure: Left Heart Cath and Cors/Grafts Angiography;  Surgeon: Lamar Blinks, MD;  Location: [facility];  Service: Cardiovascular;  Laterality: Left;   LEFT HEART CATH AND CORS/GRAFTS ANGIOGRAPHY Left 06/09/2023   Procedure: LEFT HEART CATH AND CORS/GRAFTS ANGIOGRAPHY;  Surgeon: Marcina Millard, MD;  Location: [facility];  Service: Cardiovascular;  Laterality: Left;   METATARSAL HEAD EXCISION Right 03/21/2020   Procedure: METATARSAL HEAD RESECTION RIGHT AND DEBRIDEMENT OF ULCER;  Surgeon: Felecia Shelling, DPM;  Location: [facility];  Service: Podiatry;  Laterality: Right;   METATARSAL HEAD EXCISION Left 09/15/2023   Procedure: METATARSAL HEAD EXCISION;  Surgeon: Felecia Shelling, DPM;  Location: [facility];  Service: Orthopedics/Podiatry;  Laterality: Left;   MULTIPLE TOOTH EXTRACTIONS     TEE WITHOUT CARDIOVERSION N/A 06/27/2016   Procedure: TRANSESOPHAGEAL ECHOCARDIOGRAM (TEE);  Surgeon: Kerin Perna, MD;  Location: [facility];  Service: Open Heart Surgery;  Laterality: N/A;   TENDON LENGTHENING Left 12/04/2023   Procedure: TENDON-ACHILLES LENGTHENING;  Surgeon: Felecia Shelling, DPM;  Location: [facility];  Service: Orthopedics/Podiatry;  Laterality: Left;   TRANSMETATARSAL  AMPUTATION Left 12/04/2023   Procedure: TRANSMETATARSAL AMPUTATION;  Surgeon: Felecia Shelling, DPM;  Location: [facility];  Service: Orthopedics/Podiatry;  Laterality: Left;    Allergies  Allergen Reactions   Latex Other (See Comments)    Blister and burning (38M insulin pump latex band aid )   Nitroglycerin Nausea Only and Other (See Comments)    Patches only - headache    Statins Other (See Comments)    BODY PAIN, Pt taking Crestor*      Fluvastatin Other (See Comments)    Muscle pain   Fluvastatin Sodium Other (See Comments)    Myalgias   Percocet [Oxycodone-Acetaminophen] Itching   Rosuvastatin Other (See Comments)    Muscle pain, Myalgias   Simvastatin Other (See Comments)    Muscle pain, Myalgias   Lipitor [Atorvastatin] Other (See Comments) and Rash    BODY PAIN   Lisinopril Other (See Comments) and Nausea Only    Light headed, dizzy Other reaction(s): Dizziness   Zetia [Ezetimibe] Other (See  Comments)    Muscle Pain    RT foot 12/11/2023 POV #1  LT foot 12/11/2023 POV #1   RT third toe 01/05/2024  LT foot 01/05/2024  Objective/Physical Exam Neurovascular status intact.  Significant improvement of the maceration to the amputation stump left foot.  No maceration noted today.  The amputation stump appears very dry and stable.  Right third toe amputation healed  Radiographic Exam LT foot 01/05/2024: Unchanged.  Clean osteotomy is noted through metatarsals 1-5.  No gas within the tissues.  Radiographic exam RT foot 01/05/2024: Unchanged.  Assessment: 1. s/p TMA w/ TAL LT. Partial 3rd toe amputation RT. DOS: 12/04/2023 2.  Cellulitis left foot; resolved   Plan of Care:  -Patient was evaluated.   -Continue Betadine wet-to-dry dressings -Return to clinic 3 weeks for final follow-up x-ray   Felecia Shelling, DPM [facility]  Dr. Felecia Shelling, DPM    [address] [place], [place] [address]                Office [phone]  Fax [phone]

## 2024-01-21 ENCOUNTER — Ambulatory Visit (INDEPENDENT_AMBULATORY_CARE_PROVIDER_SITE_OTHER): Admitting: Podiatry

## 2024-01-21 DIAGNOSIS — Z89432 Acquired absence of left foot: Secondary | ICD-10-CM

## 2024-01-21 MED ORDER — DOXYCYCLINE HYCLATE 100 MG PO TABS: 100.0000 mg | ORAL_TABLET | Freq: Two times a day (BID) | ORAL | 0 refills | Status: DC

## 2024-01-21 NOTE — Progress Notes (Signed)
 Subjective:  Patient ID: Lawrence [name], male    DOB: [birth year],  MRN: [id number]  Chief Complaint  Patient presents with   Routine Post Op    73 y.o. male presents with the above complaint.  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  Patient is known to Dr. Logan Bores.  He has been dealing with this wound for quite some time he did a lot of walking wanted to get eval make sure is not infected he has not seen anyone else prior to seeing me.  He had ABIs done in the past which shows some noncompressible arteries.  He has not seen a vascular surgeon in quite some time.  He is ambulatory to weightbearing as tolerated in cam boot   Review of Systems: Negative except as noted in the HPI. Denies N/V/F/Ch.  Past Medical History:  Diagnosis Date   Allergies    Anginal pain (HCC)    Bilateral carotid artery disease (HCC)    Breast pain, left 08/29/2022   CAD (coronary artery disease) 07/2007   a.) s/p NSTEMI 07/2007 - sig multi-vessel CAD not amenable to PCI; b.) LHC 06/11/2016: multi vessel CAD not amenable to PCI --> refer to CVTS; c.) s/p 4v CABG 06/27/2016; d.) LHC 06/09/2023: 75% mLM, 80% o-pLAD, 60% mLAD, 100% p-mLCx, 100% OM1, 95% pRCA, 100% mRCA, 50% m-dLAD. LIMA-LAD and SVG-OM1 patent. CTO SVG-RCA with L-R collaterals - med mgmt   Carpal tunnel syndrome on left    a.) s/p release 04/2016   CKD (chronic kidney disease), stage III (HCC)    COVID-19 12/09/2022   Diabetic peripheral neuropathy (HCC)    Diastolic dysfunction    a.) TTE 06/25/2016: EF 60-654%. no RWMAs, G1DD, mild AoV annular calc; b.) TTE 03/19/2017: EF 50-55%, no RWMAs, mild MR; c.) TTE 07/22/2019: EF 55-60%, midl LVH, no RWMAs, triv MR, mild TR   Dizziness 04/30/2021   Dyspnea    GERD (gastroesophageal reflux disease)    Glaucoma    Headache    History of bilateral cataract extraction    HLD (hyperlipidemia)    HTN (hypertension)    Long term current use of  clopidogrel    Long-term use of aspirin therapy    MRSA (methicillin resistant staph aureus) culture positive    2006 - 2008   Myocardial infarction (HCC) 04/04/2016   NSTEMI (non-ST elevated myocardial infarction) (HCC) 07/27/2007   a.) LHC 07/30/2007: 30% mLAD, 99% D1, 50% pLCx, 40% mLCx, 70/75/85% dLCx, 50% OM1, 75/80/80 OM2, 50% pRCA, 60% mRCA, 85% dRCA --> not amenable to PCI   OSA on CPAP    Osteoarthritis    PAD (peripheral artery disease) (HCC)    a. 03/2022 Lower Ext Angio: No signif Ao-iliac dzs. R PT diff dzs and occluded above ankle. R Pedal arch intact w/ excellent antegrade flow provided by R AT-->no indication for revasc.   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   a.) LIMA-LAD, SVG-RCA, seq SVG-OM1-OM2   S/P tube myringotomy    Skin cancer, basal cell    Skin ulcer of right great toe, unspecified ulcer stage (HCC)    T2DM (type 2 diabetes mellitus) (HCC)    Wears glasses    Wears partial dentures    top    Current Outpatient Medications:    acetaminophen (TYLENOL) 500 MG tablet, Take 1,000 mg by mouth every 6 (six) hours as needed for moderate pain (pain score 4-6)., Disp: , Rfl:  AIMOVIG 140 MG/ML SOAJ, Inject 140 mg into the skin every 30 (thirty) days., Disp: , Rfl:    albuterol (VENTOLIN HFA) 108 (90 Base) MCG/ACT inhaler, Inhale 2 puffs into the lungs every 4 (four) hours as needed for wheezing., Disp: 1 each, Rfl: 0   amoxicillin (AMOXIL) 500 MG tablet, Take 2,000 mg by mouth See admin instructions. Take 4 capsules (2000 mg) by mouth 1 hour prior to dental appointments (Patient not taking: Reported on 01/12/2024), Disp: , Rfl:    aspirin 81 MG EC tablet, Take 81 mg by mouth daily. Swallow whole., Disp: , Rfl:    Calcium Carb-Cholecalciferol (CALCIUM + D3 PO), Take 1 tablet by mouth in the morning and at bedtime., Disp: , Rfl:    carvedilol (COREG) 12.5 MG tablet, Take 12.5 mg by mouth 2 (two) times daily with a meal., Disp: , Rfl:     Cholecalciferol (VITAMIN D) 50 MCG (2000 UT) tablet, Take 2,000 Units by mouth in the morning and at bedtime., Disp: , Rfl:    clopidogrel (PLAVIX) 75 MG tablet, Take 1 tablet (75 mg total) by mouth daily. (Patient taking differently: Take 75 mg by mouth every evening.), Disp: 30 tablet, Rfl: 0   doxycycline (VIBRA-TABS) 100 MG tablet, Take 1 tablet (100 mg total) by mouth 2 (two) times daily. (Patient not taking: Reported on 01/12/2024), Disp: 20 tablet, Rfl: 0   doxycycline (VIBRA-TABS) 100 MG tablet, Take 1 tablet (100 mg total) by mouth 2 (two) times daily., Disp: 60 tablet, Rfl: 0   famotidine (ACID CONTROLLER) 10 MG tablet, Take 10 mg by mouth in the morning., Disp: , Rfl:    fluticasone (FLONASE) 50 MCG/ACT nasal spray, Place 1 spray into both nostrils daily as needed for allergies., Disp: , Rfl:    furosemide (LASIX) 40 MG tablet, Take 1 tablet (40 mg total) by mouth daily., Disp: 30 tablet, Rfl: 1   glucagon 1 MG injection, Inject 1 mg into the skin once as needed., Disp: , Rfl:    GLUTOSE 15 40 % GEL, Take 1 Tube by mouth once as needed for low blood sugar., Disp: , Rfl:    insulin aspart (NOVOLOG) 100 UNIT/ML injection, Inject 20 Units into the skin 3 (three) times daily. (Patient taking differently: Inject 10-20 Units into the skin 3 (three) times daily with meals. Sliding Scale will go up to 25 units for BG > 300 - 400), Disp: 10 mL, Rfl: 5   insulin glargine-yfgn (SEMGLEE) 100 UNIT/ML injection, Inject 20 Units into the skin at bedtime., Disp: , Rfl:    isosorbide mononitrate (IMDUR) 60 MG 24 hr tablet, Take 60 mg by mouth in the morning and at bedtime., Disp: , Rfl:    loratadine (CLARITIN) 10 MG tablet, Take 10 mg by mouth in the morning., Disp: , Rfl:    nitroGLYCERIN (NITROSTAT) 0.4 MG SL tablet, DISSOLVE 1 TABLET UNDER THE TONGUE EVERY 5 MINUTES AS NEEDED FOR CHEST PAIN, Disp: 30 tablet, Rfl: 0   oxyCODONE-acetaminophen (PERCOCET) 5-325 MG tablet, Take 1 tablet by mouth every 4  (four) hours as needed for severe pain (pain score 7-10). (Patient not taking: Reported on 01/12/2024), Disp: 20 tablet, Rfl: 0   potassium chloride SA (K-DUR,KLOR-CON) 20 MEQ tablet, Take 1 tablet (20 mEq total) by mouth daily., Disp: 30 tablet, Rfl: 1   rosuvastatin (CRESTOR) 20 MG tablet, Take 1 tablet (20 mg total) by mouth daily at 6 PM., Disp: 30 tablet, Rfl: 1   Travoprost, BAK Free, (TRAVATAN) 0.004 %  SOLN ophthalmic solution, Place 1 drop into both eyes at bedtime. , Disp: , Rfl:    vitamin C (ASCORBIC ACID) 500 MG tablet, Take 500 mg by mouth 2 (two) times daily. , Disp: , Rfl:   Social History   Tobacco Use  Smoking Status Former   Current packs/day: 0.00   Average packs/day: 1 pack/day for 16.7 years (16.7 ttl pk-yrs)   Types: Cigarettes   Start date: 11/03/1969   Quit date: 07/22/1986   Years since quitting: 37.5  Smokeless Tobacco Never    Allergies  Allergen Reactions   Latex Other (See Comments)    Blister and burning (44M insulin pump latex band aid )   Nitroglycerin Nausea Only and Other (See Comments)    Patches only - headache    Statins Other (See Comments)    BODY PAIN, Pt taking Crestor*      Fluvastatin Other (See Comments)    Muscle pain   Fluvastatin Sodium Other (See Comments)    Myalgias   Percocet [Oxycodone-Acetaminophen] Itching   Rosuvastatin Other (See Comments)    Muscle pain, Myalgias   Simvastatin Other (See Comments)    Muscle pain, Myalgias   Lipitor [Atorvastatin] Other (See Comments) and Rash    BODY PAIN   Lisinopril Other (See Comments) and Nausea Only    Light headed, dizzy Other reaction(s): Dizziness   Zetia [Ezetimibe] Other (See Comments)    Muscle Pain   Objective:  There were no vitals filed for this visit. There is no height or weight on file to calculate BMI. Constitutional Well developed. Well nourished.  Vascular Dorsalis pedis pulses non palpable bilaterally. Posterior tibial pulses non palpable  bilaterally. Capillary refill normal to all digits.  No cyanosis or clubbing noted. Pedal hair growth normal.  Neurologic Normal speech. Oriented to person, place, and time. Epicritic sensation to light touch grossly present bilaterally.  Dermatologic Nails well groomed and normal in appearance. No open wounds. No skin lesions.  Orthopedic: Left transmetatarsal amputation site noted with superficial dehiscence.  Does not probe down to deep tissue or bone.  Mild redness noted.  No purulent drainage noted.  No gross infection happened   Radiographs: None Assessment:   1. History of transmetatarsal amputation of left foot (HCC)    Plan:  Patient was evaluated and treated and all questions answered.  Left transmetatarsal amputation site wound dehiscence -All question concerns were discussed with the patient in extensive detail -Given the amount of wound dehiscence is present patient will continue Betadine wet-to-dry dressing changes daily to allow the soft tissue structure to heal.  I encouraged him to keep it dry he states understanding -Doxycycline was sent to the pharmacy for skin and soft tissue prophylaxis -Patient will benefit from referral to vascular surgery for intervention given the findings of wound as well as the latest ABIs -In the future patient may benefit from graft application if there is any improvement in blood flow

## 2024-01-22 ENCOUNTER — Other Ambulatory Visit: Payer: Self-pay | Admitting: Student

## 2024-01-22 DIAGNOSIS — H93A9 Pulsatile tinnitus, unspecified ear: Secondary | ICD-10-CM

## 2024-01-22 DIAGNOSIS — G43809 Other migraine, not intractable, without status migrainosus: Secondary | ICD-10-CM

## 2024-01-25 ENCOUNTER — Other Ambulatory Visit (INDEPENDENT_AMBULATORY_CARE_PROVIDER_SITE_OTHER): Payer: Self-pay | Admitting: Vascular Surgery

## 2024-01-25 DIAGNOSIS — Z89432 Acquired absence of left foot: Secondary | ICD-10-CM

## 2024-01-26 ENCOUNTER — Ambulatory Visit (INDEPENDENT_AMBULATORY_CARE_PROVIDER_SITE_OTHER)

## 2024-01-26 DIAGNOSIS — Z89432 Acquired absence of left foot: Secondary | ICD-10-CM

## 2024-01-27 ENCOUNTER — Ambulatory Visit
Admission: RE | Admit: 2024-01-27 | Discharge: 2024-01-27 | Disposition: A | Discharge status: Home / Self Care | Source: Ambulatory Visit | Attending: Student | Admitting: Student

## 2024-01-27 DIAGNOSIS — G43809 Other migraine, not intractable, without status migrainosus: Secondary | ICD-10-CM

## 2024-01-27 DIAGNOSIS — I7025 Atherosclerosis of native arteries of other extremities with ulceration: Secondary | ICD-10-CM | POA: Insufficient documentation

## 2024-01-27 DIAGNOSIS — H93A9 Pulsatile tinnitus, unspecified ear: Secondary | ICD-10-CM | POA: Insufficient documentation

## 2024-01-27 MED ORDER — GADOBUTROL 1 MMOL/ML IV SOLN
10.0000 mL | Freq: Once | INTRAVENOUS | Status: AC | PRN
  Administered 2024-01-27: 10 mL via INTRAVENOUS

## 2024-01-27 NOTE — Progress Notes (Unsigned)
 MRN : [id number]  Lawrence [name] is a 73 y.o. (12/30/1950) male who presents with chief complaint of check circulation.  History of Present Illness:    The patient is seen for evaluation of painful lower extremities and diminished pulses.   He is status post transmetatarsal amputation of the left foot on December 04, 2023.  He also underwent partial right third toe amputation.  Patient notes the pain is always associated with activity and is very consistent day today. Typically, the pain occurs at less than one block, progress is as activity continues to the point that the patient must stop walking. Resting including standing still for several minutes allows the patient to walk a similar distance before being forced to stop again. Uneven terrain and inclines shorten the distance. The pain has been progressive over the past several years. The patient denies any abrupt changes in claudication symptoms.  The patient states the inability to walk is causing problems with daily activities.  The patient denies rest pain or dangling of an extremity off the side of the bed during the night for relief. No open wounds or sores at this time. No prior interventions or surgeries.  No history of back problems or DJD of the lumbar sacral spine.   The patient's blood pressure has been stable and relatively well controlled. The patient denies amaurosis fugax or recent TIA symptoms. There are no recent neurological changes noted. The patient denies history of DVT, PE or superficial thrombophlebitis. The patient denies recent episodes of angina or shortness of breath.   No outpatient medications have been marked as taking for the 01/28/24 encounter (Appointment) with Gilda Crease, Latina Craver, MD.    Past Medical History:  Diagnosis Date   Allergies    Anginal pain (HCC)    Bilateral carotid artery disease (HCC)    Breast pain, left  08/29/2022   CAD (coronary artery disease) 07/2007   a.) s/p NSTEMI 07/2007 - sig multi-vessel CAD not amenable to PCI; b.) LHC 06/11/2016: multi vessel CAD not amenable to PCI --> refer to CVTS; c.) s/p 4v CABG 06/27/2016; d.) LHC 06/09/2023: 75% mLM, 80% o-pLAD, 60% mLAD, 100% p-mLCx, 100% OM1, 95% pRCA, 100% mRCA, 50% m-dLAD. LIMA-LAD and SVG-OM1 patent. CTO SVG-RCA with L-R collaterals - med mgmt   Carpal tunnel syndrome on left    a.) s/p release 04/2016   CKD (chronic kidney disease), stage III (HCC)    COVID-19 12/09/2022   Diabetic peripheral neuropathy (HCC)    Diastolic dysfunction    a.) TTE 06/25/2016: EF 60-654%. no RWMAs, G1DD, mild AoV annular calc; b.) TTE 03/19/2017: EF 50-55%, no RWMAs, mild MR; c.) TTE 07/22/2019: EF 55-60%, midl LVH, no RWMAs, triv MR, mild TR   Dizziness 04/30/2021   Dyspnea    GERD (gastroesophageal reflux disease)    Glaucoma    Headache    History of bilateral cataract extraction    HLD (hyperlipidemia)    HTN (hypertension)    Long term current use of clopidogrel    Long-term use of aspirin therapy    MRSA (methicillin resistant staph aureus)  culture positive    2006 - 2008   Myocardial infarction (HCC) 04/04/2016   NSTEMI (non-ST elevated myocardial infarction) (HCC) 07/27/2007   a.) LHC 07/30/2007: 30% mLAD, 99% D1, 50% pLCx, 40% mLCx, 70/75/85% dLCx, 50% OM1, 75/80/80 OM2, 50% pRCA, 60% mRCA, 85% dRCA --> not amenable to PCI   OSA on CPAP    Osteoarthritis    PAD (peripheral artery disease) (HCC)    a. 03/2022 Lower Ext Angio: No signif Ao-iliac dzs. R PT diff dzs and occluded above ankle. R Pedal arch intact w/ excellent antegrade flow provided by R AT-->no indication for revasc.   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   a.) LIMA-LAD, SVG-RCA, seq SVG-OM1-OM2   S/P tube myringotomy    Skin cancer, basal cell    Skin ulcer of right great toe, unspecified ulcer stage (HCC)    T2DM (type 2 diabetes  mellitus) (HCC)    Wears glasses    Wears partial dentures    top    Past Surgical History:  Procedure Laterality Date   ABDOMINAL AORTOGRAM W/LOWER EXTREMITY N/A 03/26/2022   Procedure: ABDOMINAL AORTOGRAM W/LOWER EXTREMITY;  Surgeon: Iran Ouch, MD;  Location: [facility];  Service: Cardiovascular;  Laterality: N/A;   AMPUTATION TOE Bilateral 06/26/2023   Procedure: AMPUTATION TOE MPJ OF THE JOINT,PARTIAL TOE AMPUTUATION;  Surgeon: Felecia Shelling, DPM;  Location: [facility];  Service: Orthopedics/Podiatry;  Laterality: Bilateral;   AMPUTATION TOE Right 12/04/2023   Procedure: AMPUTATION TOE INTERPHALANGEAL 3RD;  Surgeon: Felecia Shelling, DPM;  Location: [facility];  Service: Orthopedics/Podiatry;  Laterality: Right;   APPENDECTOMY     CARDIAC CATHETERIZATION N/A 06/11/2016   Procedure: Left Heart Cath and Coronary Angiography;  Surgeon: Lamar Blinks, MD;  Location: [facility];  Service: Cardiovascular;  Laterality: N/A;   CARPAL TUNNEL RELEASE Left 04/24/2016   Procedure: CARPAL TUNNEL RELEASE;  Surgeon: Kennedy Bucker, MD;  Location: [facility];  Service: Orthopedics;  Laterality: Left;   CATARACT EXTRACTION W/ INTRAOCULAR LENS  IMPLANT, BILATERAL Bilateral    CATARACT EXTRACTION, BILATERAL     R eye 07/16/12, L eye 08/13/12 - with lens implant in both eyes   CORONARY ARTERY BYPASS GRAFT N/A 06/27/2016   Procedure: CORONARY ARTERY BYPASS GRAFTING times four using left internal mammary artery and right leg saphenous vein;  Surgeon: Kerin Perna, MD;  Location: [facility] OR;  Service: Open Heart Surgery;  Laterality: N/A;   FOREARM FRACTURE SURGERY Left 1961   KNEE ARTHROPLASTY Left 02/11/2016   Procedure: COMPUTER ASSISTED TOTAL KNEE ARTHROPLASTY;  Surgeon: Donato Heinz, MD;  Location: [facility];  Service: Orthopedics;  Laterality: Left;   KNEE ARTHROSCOPY Right 2000   LEFT HEART CATH AND CORONARY ANGIOGRAPHY Left 07/30/2007   Procedure: LEFT HEART CATH AND CORONARY  ANGIOGRAPHY; Location: [facility]; Surgeon: Arnoldo Hooker, MD   LEFT HEART CATH AND CORS/GRAFTS ANGIOGRAPHY Left 01/13/2017   Procedure: Left Heart Cath and Cors/Grafts Angiography;  Surgeon: Lamar Blinks, MD;  Location: [facility];  Service: Cardiovascular;  Laterality: Left;   LEFT HEART CATH AND CORS/GRAFTS ANGIOGRAPHY Left 06/09/2023   Procedure: LEFT HEART CATH AND CORS/GRAFTS ANGIOGRAPHY;  Surgeon: Marcina Millard, MD;  Location: [facility];  Service: Cardiovascular;  Laterality: Left;   METATARSAL HEAD EXCISION Right 03/21/2020   Procedure: METATARSAL HEAD RESECTION RIGHT AND DEBRIDEMENT OF ULCER;  Surgeon: Felecia Shelling, DPM;  Location: [facility];  Service:  Podiatry;  Laterality: Right;   METATARSAL HEAD EXCISION Left 09/15/2023   Procedure: METATARSAL HEAD EXCISION;  Surgeon: Felecia Shelling, DPM;  Location: [facility];  Service: Orthopedics/Podiatry;  Laterality: Left;   MULTIPLE TOOTH EXTRACTIONS     TEE WITHOUT CARDIOVERSION N/A 06/27/2016   Procedure: TRANSESOPHAGEAL ECHOCARDIOGRAM (TEE);  Surgeon: Kerin Perna, MD;  Location[facility] OR;  Service: Open Heart Surgery;  Laterality: N/A;   TENDON LENGTHENING Left 12/04/2023   Procedure: TENDON-ACHILLES LENGTHENING;  Surgeon: Felecia Shelling, DPM;  Location: [facility];  Service: Orthopedics/Podiatry;  Laterality: Left;   TRANSMETATARSAL AMPUTATION Left 12/04/2023   Procedure: TRANSMETATARSAL AMPUTATION;  Surgeon: Felecia Shelling, DPM;  Location: [facility];  Service: Orthopedics/Podiatry;  Laterality: Left;    Social History Social History   Tobacco Use   Smoking status: Former    Current packs/day: 0.00    Average packs/day: 1 pack/day for 16.7 years (16.7 ttl pk-yrs)    Types: Cigarettes    Start date: 11/03/1969    Quit date: 07/22/1986    Years since quitting: 37.5   Smokeless tobacco: Never  Vaping Use   Vaping status: Never Used  Substance Use Topics   Alcohol use: Not Currently   Drug use: No    Family  History Family History  Problem Relation Age of Onset   Hypertension Mother    Lupus Mother    Heart Problems Mother    Hypertension Father    Diabetes Father    Heart attack Father 38       died in his 30s   Drug abuse Brother    Heart disease Brother    Heart attack Maternal Grandmother    Arthritis Maternal Grandmother    Arthritis Maternal Grandfather    Heart attack Paternal Grandmother    Arthritis Paternal Grandmother    Arthritis Paternal Grandfather     Allergies  Allergen Reactions   Latex Other (See Comments)    Blister and burning (50M insulin pump latex band aid )   Nitroglycerin Nausea Only and Other (See Comments)    Patches only - headache    Statins Other (See Comments)    BODY PAIN, Pt taking Crestor*      Fluvastatin Other (See Comments)    Muscle pain   Fluvastatin Sodium Other (See Comments)    Myalgias   Percocet [Oxycodone-Acetaminophen] Itching   Rosuvastatin Other (See Comments)    Muscle pain, Myalgias   Simvastatin Other (See Comments)    Muscle pain, Myalgias   Lipitor [Atorvastatin] Other (See Comments) and Rash    BODY PAIN   Lisinopril Other (See Comments) and Nausea Only    Light headed, dizzy Other reaction(s): Dizziness   Zetia [Ezetimibe] Other (See Comments)    Muscle Pain     REVIEW OF SYSTEMS (Negative unless checked)  Constitutional: [] Weight loss  [] Fever  [] Chills Cardiac: [] Chest pain   [] Chest pressure   [] Palpitations   [] Shortness of breath when laying flat   [] Shortness of breath with exertion. Vascular:  [x] Pain in legs with walking   [] Pain in legs at rest  [] History of DVT   [] Phlebitis   [] Swelling in legs   [] Varicose veins   [] Non-healing ulcers Pulmonary:   [] Uses home oxygen   [] Productive cough   [] Hemoptysis   [] Wheeze  [] COPD   [] Asthma Neurologic:  [] Dizziness   [] Seizures   [] History of stroke   [] History of TIA  [] Aphasia   [] Vissual changes   [] Weakness or numbness in  arm   [] Weakness or numbness in  leg Musculoskeletal:   [] Joint swelling   [x] Joint pain   [x] Low back pain Hematologic:  [] Easy bruising  [] Easy bleeding   [] Hypercoagulable state   [] Anemic Gastrointestinal:  [] Diarrhea   [] Vomiting  [x] Gastroesophageal reflux/heartburn   [] Difficulty swallowing. Genitourinary:  [] Chronic kidney disease   [] Difficult urination  [] Frequent urination   [] Blood in urine Skin:  [] Rashes   [] Ulcers  Psychological:  [] History of anxiety   []  History of major depression.  Physical Examination  There were no vitals filed for this visit. There is no height or weight on file to calculate BMI. Gen: WD/WN, NAD Head: [place]/AT, No temporalis wasting.  Ear/Nose/Throat: Hearing grossly intact, nares w/o erythema or drainage Eyes: PER, EOMI, sclera nonicteric.  Neck: Supple, no masses.  No bruit or JVD.  Pulmonary:  Good air movement, no audible wheezing, no use of accessory muscles.  Cardiac: RRR, normal S1, S2, no Murmurs. Vascular:  mild trophic changes, no open wounds Vessel Right Left  Radial Palpable Palpable  PT Not Palpable Not Palpable  DP Not Palpable Not Palpable  Gastrointestinal: soft, non-distended. No guarding/no peritoneal signs.  Musculoskeletal: M/S 5/5 throughout.  No visible deformity.  Neurologic: CN 2-12 intact. Pain and light touch intact in extremities.  Symmetrical.  Speech is fluent. Motor exam as listed above. Psychiatric: Judgment intact, Mood & affect appropriate for pt's clinical situation. Dermatologic: No rashes or ulcers noted.  No changes consistent with cellulitis.   CBC Lab Results  Component Value Date   WBC 7.2 09/09/2023   HGB 12.6 (L) 09/09/2023   HCT 36.9 (L) 09/09/2023   MCV 92.9 09/09/2023   PLT 191 09/09/2023    BMET    Component Value Date/Time   NA 141 11/02/2023 1454   NA 139 02/25/2022 1150   NA 137 10/12/2013 0515   K 4.2 11/02/2023 1454   K 3.9 10/12/2013 0515   CL 101 11/02/2023 1454   CL 101 10/12/2013 0515   CO2 30 11/02/2023 1454    CO2 31 10/12/2013 0515   GLUCOSE 115 (H) 11/02/2023 1454   GLUCOSE 218 (H) 10/12/2013 0515   BUN 26 (H) 11/02/2023 1454   BUN 19 02/25/2022 1150   BUN 20 (H) 10/12/2013 0515   CREATININE 1.34 11/02/2023 1454   CREATININE 0.99 10/12/2013 0515   CALCIUM 9.5 11/02/2023 1454   CALCIUM 8.8 10/12/2013 0515   GFRNONAA >60 09/09/2023 1438   GFRNONAA >60 10/12/2013 0515   GFRAA >60 04/23/2017 0937   GFRAA >60 10/12/2013 0515   CrCl cannot be calculated (Patient's most recent lab result is older than the maximum 21 days allowed.).  COAG Lab Results  Component Value Date   INR 1.07 04/23/2017   INR 1.05 03/18/2017   INR 1.29 06/27/2016    Radiology DG Foot Complete Left Result Date: 01/05/2024 Please see detailed radiograph report in office note.  DG Foot Complete Right Result Date: 01/05/2024 Please see detailed radiograph report in office note.    Assessment/Plan There are no diagnoses linked to this encounter.   Levora Dredge, MD  01/27/2024 4:13 PM

## 2024-01-27 NOTE — H&P (View-Only) (Signed)
 MRN : [id number]  Lawrence [name] is a 73 y.o. (1950/12/19) male who presents with chief complaint of check circulation.  History of Present Illness:    The patient is seen for evaluation of painful lower extremities and diminished pulses.   He is status post transmetatarsal amputation of the left foot on December 04, 2023.  He also underwent partial right third toe amputation.  Left foot amputation has not healed and in fact infection.  He is currently being treated for recurrent infection.  The patient denies rest pain or dangling of an extremity off the side of the bed during the night for relief. No open wounds or sores at this time. No prior interventions or surgeries.  No history of back problems or DJD of the lumbar sacral spine.   The patient's blood pressure has been stable and relatively well controlled. The patient denies amaurosis fugax or recent TIA symptoms. There are no recent neurological changes noted. The patient denies history of DVT, PE or superficial thrombophlebitis. The patient denies recent episodes of angina or shortness of breath.   ABI's Rt=[place] (triphasic) and Lt=[place] (biphasic)   No outpatient medications have been marked as taking for the 01/28/24 encounter (Appointment) with Gilda Crease, Latina Craver, MD.    Past Medical History:  Diagnosis Date   Allergies    Anginal pain (HCC)    Bilateral carotid artery disease (HCC)    Breast pain, left 08/29/2022   CAD (coronary artery disease) 07/2007   a.) s/p NSTEMI 07/2007 - sig multi-vessel CAD not amenable to PCI; b.) LHC 06/11/2016: multi vessel CAD not amenable to PCI --> refer to CVTS; c.) s/p 4v CABG 06/27/2016; d.) LHC 06/09/2023: 75% mLM, 80% o-pLAD, 60% mLAD, 100% p-mLCx, 100% OM1, 95% pRCA, 100% mRCA, 50% m-dLAD. LIMA-LAD and SVG-OM1 patent. CTO SVG-RCA with L-R collaterals - med mgmt   Carpal tunnel syndrome on left    a.) s/p release  04/2016   CKD (chronic kidney disease), stage III (HCC)    COVID-19 12/09/2022   Diabetic peripheral neuropathy (HCC)    Diastolic dysfunction    a.) TTE 06/25/2016: EF 60-654%. no RWMAs, G1DD, mild AoV annular calc; b.) TTE 03/19/2017: EF 50-55%, no RWMAs, mild MR; c.) TTE 07/22/2019: EF 55-60%, midl LVH, no RWMAs, triv MR, mild TR   Dizziness 04/30/2021   Dyspnea    GERD (gastroesophageal reflux disease)    Glaucoma    Headache    History of bilateral cataract extraction    HLD (hyperlipidemia)    HTN (hypertension)    Long term current use of clopidogrel    Long-term use of aspirin therapy    MRSA (methicillin resistant staph aureus) culture positive    2006 - 2008   Myocardial infarction (HCC) 04/04/2016   NSTEMI (non-ST elevated myocardial infarction) (HCC) 07/27/2007   a.) LHC 07/30/2007: 30% mLAD, 99% D1, 50% pLCx, 40% mLCx, 70/75/85% dLCx, 50% OM1, 75/80/80 OM2, 50% pRCA, 60% mRCA, 85% dRCA --> not amenable to PCI   OSA on CPAP    Osteoarthritis    PAD (peripheral artery disease) (HCC)  a. 03/2022 Lower Ext Angio: No signif Ao-iliac dzs. R PT diff dzs and occluded above ankle. R Pedal arch intact w/ excellent antegrade flow provided by R AT-->no indication for revasc.   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   a.) LIMA-LAD, SVG-RCA, seq SVG-OM1-OM2   S/P tube myringotomy    Skin cancer, basal cell    Skin ulcer of right great toe, unspecified ulcer stage (HCC)    T2DM (type 2 diabetes mellitus) (HCC)    Wears glasses    Wears partial dentures    top    Past Surgical History:  Procedure Laterality Date   ABDOMINAL AORTOGRAM W/LOWER EXTREMITY N/A 03/26/2022   Procedure: ABDOMINAL AORTOGRAM W/LOWER EXTREMITY;  Surgeon: Iran Ouch, MD;  Location: [facility];  Service: Cardiovascular;  Laterality: N/A;   AMPUTATION TOE Bilateral 06/26/2023   Procedure: AMPUTATION TOE MPJ OF THE JOINT,PARTIAL TOE AMPUTUATION;  Surgeon:  Felecia Shelling, DPM;  Location: [facility];  Service: Orthopedics/Podiatry;  Laterality: Bilateral;   AMPUTATION TOE Right 12/04/2023   Procedure: AMPUTATION TOE INTERPHALANGEAL 3RD;  Surgeon: Felecia Shelling, DPM;  Location: [facility];  Service: Orthopedics/Podiatry;  Laterality: Right;   APPENDECTOMY     CARDIAC CATHETERIZATION N/A 06/11/2016   Procedure: Left Heart Cath and Coronary Angiography;  Surgeon: Lamar Blinks, MD;  Location: [facility];  Service: Cardiovascular;  Laterality: N/A;   CARPAL TUNNEL RELEASE Left 04/24/2016   Procedure: CARPAL TUNNEL RELEASE;  Surgeon: Kennedy Bucker, MD;  Location: [facility];  Service: Orthopedics;  Laterality: Left;   CATARACT EXTRACTION W/ INTRAOCULAR LENS  IMPLANT, BILATERAL Bilateral    CATARACT EXTRACTION, BILATERAL     R eye 07/16/12, L eye 08/13/12 - with lens implant in both eyes   CORONARY ARTERY BYPASS GRAFT N/A 06/27/2016   Procedure: CORONARY ARTERY BYPASS GRAFTING times four using left internal mammary artery and right leg saphenous vein;  Surgeon: Kerin Perna, MD;  Location: [facility] OR;  Service: Open Heart Surgery;  Laterality: N/A;   FOREARM FRACTURE SURGERY Left 1961   KNEE ARTHROPLASTY Left 02/11/2016   Procedure: COMPUTER ASSISTED TOTAL KNEE ARTHROPLASTY;  Surgeon: Donato Heinz, MD;  Location: [facility];  Service: Orthopedics;  Laterality: Left;   KNEE ARTHROSCOPY Right 2000   LEFT HEART CATH AND CORONARY ANGIOGRAPHY Left 07/30/2007   Procedure: LEFT HEART CATH AND CORONARY ANGIOGRAPHY; Location: [facility]; Surgeon: Arnoldo Hooker, MD   LEFT HEART CATH AND CORS/GRAFTS ANGIOGRAPHY Left 01/13/2017   Procedure: Left Heart Cath and Cors/Grafts Angiography;  Surgeon: Lamar Blinks, MD;  Location: [facility];  Service: Cardiovascular;  Laterality: Left;   LEFT HEART CATH AND CORS/GRAFTS ANGIOGRAPHY Left 06/09/2023   Procedure: LEFT HEART CATH AND CORS/GRAFTS ANGIOGRAPHY;  Surgeon: Marcina Millard, MD;  Location: [facility];  Service: Cardiovascular;  Laterality: Left;   METATARSAL HEAD EXCISION Right 03/21/2020   Procedure: METATARSAL HEAD RESECTION RIGHT AND DEBRIDEMENT OF ULCER;  Surgeon: Felecia Shelling, DPM;  Location: [facility];  Service: Podiatry;  Laterality: Right;   METATARSAL HEAD EXCISION Left 09/15/2023   Procedure: METATARSAL HEAD EXCISION;  Surgeon: Felecia Shelling, DPM;  Location: [facility];  Service: Orthopedics/Podiatry;  Laterality: Left;   MULTIPLE TOOTH EXTRACTIONS     TEE WITHOUT CARDIOVERSION N/A 06/27/2016   Procedure: TRANSESOPHAGEAL ECHOCARDIOGRAM (TEE);  Surgeon: Kerin Perna, MD;  Location: [facility] OR;  Service: Open Heart Surgery;  Laterality: N/A;  TENDON LENGTHENING Left 12/04/2023   Procedure: TENDON-ACHILLES LENGTHENING;  Surgeon: Felecia Shelling, DPM;  Location: [facility];  Service: Orthopedics/Podiatry;  Laterality: Left;   TRANSMETATARSAL AMPUTATION Left 12/04/2023   Procedure: TRANSMETATARSAL AMPUTATION;  Surgeon: Felecia Shelling, DPM;  Location: [facility];  Service: Orthopedics/Podiatry;  Laterality: Left;    Social History Social History   Tobacco Use   Smoking status: Former    Current packs/day: 0.00    Average packs/day: 1 pack/day for 16.7 years (16.7 ttl pk-yrs)    Types: Cigarettes    Start date: 11/03/1969    Quit date: 07/22/1986    Years since quitting: 37.5   Smokeless tobacco: Never  Vaping Use   Vaping status: Never Used  Substance Use Topics   Alcohol use: Not Currently   Drug use: No    Family History Family History  Problem Relation Age of Onset   Hypertension Mother    Lupus Mother    Heart Problems Mother    Hypertension Father    Diabetes Father    Heart attack Father 67       died in his 68s   Drug abuse Brother    Heart disease Brother    Heart attack Maternal Grandmother    Arthritis Maternal Grandmother    Arthritis Maternal Grandfather    Heart attack Paternal Grandmother    Arthritis Paternal Grandmother    Arthritis Paternal  Grandfather     Allergies  Allergen Reactions   Latex Other (See Comments)    Blister and burning (48M insulin pump latex band aid )   Nitroglycerin Nausea Only and Other (See Comments)    Patches only - headache    Statins Other (See Comments)    BODY PAIN, Pt taking Crestor*      Fluvastatin Other (See Comments)    Muscle pain   Fluvastatin Sodium Other (See Comments)    Myalgias   Percocet [Oxycodone-Acetaminophen] Itching   Rosuvastatin Other (See Comments)    Muscle pain, Myalgias   Simvastatin Other (See Comments)    Muscle pain, Myalgias   Lipitor [Atorvastatin] Other (See Comments) and Rash    BODY PAIN   Lisinopril Other (See Comments) and Nausea Only    Light headed, dizzy Other reaction(s): Dizziness   Zetia [Ezetimibe] Other (See Comments)    Muscle Pain     REVIEW OF SYSTEMS (Negative unless checked)  Constitutional: [] Weight loss  [] Fever  [] Chills Cardiac: [] Chest pain   [] Chest pressure   [] Palpitations   [] Shortness of breath when laying flat   [] Shortness of breath with exertion. Vascular:  [x] Pain in legs with walking   [] Pain in legs at rest  [] History of DVT   [] Phlebitis   [] Swelling in legs   [] Varicose veins   [] Non-healing ulcers Pulmonary:   [] Uses home oxygen   [] Productive cough   [] Hemoptysis   [] Wheeze  [] COPD   [] Asthma Neurologic:  [] Dizziness   [] Seizures   [] History of stroke   [] History of TIA  [] Aphasia   [] Vissual changes   [] Weakness or numbness in arm   [] Weakness or numbness in leg Musculoskeletal:   [] Joint swelling   [x] Joint pain   [x] Low back pain Hematologic:  [] Easy bruising  [] Easy bleeding   [] Hypercoagulable state   [] Anemic Gastrointestinal:  [] Diarrhea   [] Vomiting  [x] Gastroesophageal reflux/heartburn   [] Difficulty swallowing. Genitourinary:  [] Chronic kidney disease   [] Difficult urination  [] Frequent urination   [] Blood in urine Skin:  [] Rashes   [] Ulcers  Psychological:  [] History of anxiety   []  History of major  depression.  Physical Examination  There were no vitals filed for this visit. There is no height or weight on file to calculate BMI. Gen: WD/WN, NAD Head: Tower/AT, No temporalis wasting.  Ear/Nose/Throat: Hearing grossly intact, nares w/o erythema or drainage Eyes: PER, EOMI, sclera nonicteric.  Neck: Supple, no masses.  No bruit or JVD.  Pulmonary:  Good air movement, no audible wheezing, no use of accessory muscles.  Cardiac: RRR, normal S1, S2, no Murmurs. Vascular:  mild trophic changes, no open wounds Vessel Right Left  Radial Palpable Palpable  PT Not Palpable Not Palpable  DP Not Palpable Not Palpable  Gastrointestinal: soft, non-distended. No guarding/no peritoneal signs.  Musculoskeletal: M/S 5/5 throughout.  No visible deformity.  Neurologic: CN 2-12 intact. Pain and light touch intact in extremities.  Symmetrical.  Speech is fluent. Motor exam as listed above. Psychiatric: Judgment intact, Mood & affect appropriate for pt's clinical situation. Dermatologic: No rashes or ulcers noted.  No changes consistent with cellulitis.   CBC Lab Results  Component Value Date   WBC 7.2 09/09/2023   HGB 12.6 (L) 09/09/2023   HCT 36.9 (L) 09/09/2023   MCV 92.9 09/09/2023   PLT 191 09/09/2023    BMET    Component Value Date/Time   NA 141 11/02/2023 1454   NA 139 02/25/2022 1150   NA 137 10/12/2013 0515   K 4.2 11/02/2023 1454   K 3.9 10/12/2013 0515   CL 101 11/02/2023 1454   CL 101 10/12/2013 0515   CO2 30 11/02/2023 1454   CO2 31 10/12/2013 0515   GLUCOSE 115 (H) 11/02/2023 1454   GLUCOSE 218 (H) 10/12/2013 0515   BUN 26 (H) 11/02/2023 1454   BUN 19 02/25/2022 1150   BUN 20 (H) 10/12/2013 0515   CREATININE 1.34 11/02/2023 1454   CREATININE 0.99 10/12/2013 0515   CALCIUM 9.5 11/02/2023 1454   CALCIUM 8.8 10/12/2013 0515   GFRNONAA >60 09/09/2023 1438   GFRNONAA >60 10/12/2013 0515   GFRAA >60 04/23/2017 0937   GFRAA >60 10/12/2013 0515   CrCl cannot be calculated  (Patient's most recent lab result is older than the maximum 21 days allowed.).  COAG Lab Results  Component Value Date   INR 1.07 04/23/2017   INR 1.05 03/18/2017   INR 1.29 06/27/2016    Radiology DG Foot Complete Left Result Date: 01/05/2024 Please see detailed radiograph report in office note.  DG Foot Complete Right Result Date: 01/05/2024 Please see detailed radiograph report in office note.    Assessment/Plan 1. 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  The ABIs obtained are abnormal, he is noncompressible at the ankle and is already undergoing toe amputation so TBI's are not obtainable.  He has Doppler signals are also abnormal.  This represents a limb threatening ischemia and places the patient at the risk for left limb loss.  Patient should undergo angiography of the left lower extremity with the hope for intervention for limb salvage.  The risks and benefits as well as the alternative therapies was discussed in detail with the patient.  All questions were answered.  Patient agrees to proceed with left angiography.  The patient will follow up with me in the office after the procedure.   I70.25  Atherosclerotic occlusive disease with ulceration  CPT codes: 91478   stent placement femoral-popliteal artery 36247   introduction catheter below diaphragm third order  2. Bilateral carotid artery stenosis Recommend:  Given the patient's asymptomatic subcritical stenosis no further invasive testing or surgery at this time.  Duplex ultrasound shows <30% stenosis bilaterally.  Continue antiplatelet therapy as prescribed Continue management of CAD, HTN and Hyperlipidemia Healthy heart diet,  encouraged exercise at least 4 times per week  Follow up in 24 months with duplex  ultrasound and physical exam   3. Benign essential HTN Continue antihypertensive medications as already ordered, these medications have been reviewed and there are no changes at this time.  4. Coronary artery disease involving native coronary artery of native heart with angina pectoris (HCC) Continue cardiac and antihypertensive medications as already ordered and reviewed, no changes at this time.  Continue statin as ordered and reviewed, no changes at this time  Nitrates PRN for chest pain  5. Hyperlipidemia, unspecified hyperlipidemia type Continue statin as ordered and reviewed, no changes at this time    Levora Dredge, MD  01/27/2024 4:13 PM

## 2024-01-28 ENCOUNTER — Ambulatory Visit (INDEPENDENT_AMBULATORY_CARE_PROVIDER_SITE_OTHER): Admitting: Podiatry

## 2024-01-28 ENCOUNTER — Encounter (INDEPENDENT_AMBULATORY_CARE_PROVIDER_SITE_OTHER): Payer: Self-pay | Admitting: Vascular Surgery

## 2024-01-28 ENCOUNTER — Ambulatory Visit (INDEPENDENT_AMBULATORY_CARE_PROVIDER_SITE_OTHER): Admitting: Vascular Surgery

## 2024-01-28 VITALS — BP 123/74 | HR 64 | Resp 18 | Ht 70.0 in | Wt 290.4 lb

## 2024-01-28 DIAGNOSIS — L97522 Non-pressure chronic ulcer of other part of left foot with fat layer exposed: Secondary | ICD-10-CM

## 2024-01-28 DIAGNOSIS — I6523 Occlusion and stenosis of bilateral carotid arteries: Secondary | ICD-10-CM

## 2024-01-28 DIAGNOSIS — E785 Hyperlipidemia, unspecified: Secondary | ICD-10-CM

## 2024-01-28 DIAGNOSIS — I25119 Atherosclerotic heart disease of native coronary artery with unspecified angina pectoris: Secondary | ICD-10-CM

## 2024-01-28 DIAGNOSIS — I10 Essential (primary) hypertension: Secondary | ICD-10-CM | POA: Diagnosis not present

## 2024-01-28 DIAGNOSIS — I7025 Atherosclerosis of native arteries of other extremities with ulceration: Secondary | ICD-10-CM | POA: Diagnosis not present

## 2024-01-28 LAB — VAS US ABI WITH/WO TBI: Left ABI: 1.38

## 2024-01-28 NOTE — Progress Notes (Signed)
 Subjective:  Patient ID: Lawrence [name], male    DOB: 07-Apr-1951,  MRN: [id number]  Chief Complaint  Patient presents with   Routine Post Op    73 y.o. male presents for wound care.  Patient presents with left metatarsal amputation site wound.  Patient states that has been present for greater than 4 weeks.  Just awaiting vascular there will be scheduled for angiogram soon.  They deny any other acute complaints.  Review of Systems: Negative except as noted in the HPI. Denies N/V/F/Ch.  Past Medical History:  Diagnosis Date   Allergies    Anginal pain (HCC)    Bilateral carotid artery disease (HCC)    Breast pain, left 08/29/2022   CAD (coronary artery disease) 07/2007   a.) s/p NSTEMI 07/2007 - sig multi-vessel CAD not amenable to PCI; b.) LHC 06/11/2016: multi vessel CAD not amenable to PCI --> refer to CVTS; c.) s/p 4v CABG 06/27/2016; d.) LHC 06/09/2023: 75% mLM, 80% o-pLAD, 60% mLAD, 100% p-mLCx, 100% OM1, 95% pRCA, 100% mRCA, 50% m-dLAD. LIMA-LAD and SVG-OM1 patent. CTO SVG-RCA with L-R collaterals - med mgmt   Carpal tunnel syndrome on left    a.) s/p release 04/2016   CKD (chronic kidney disease), stage III (HCC)    COVID-19 12/09/2022   Diabetic peripheral neuropathy (HCC)    Diastolic dysfunction    a.) TTE 06/25/2016: EF 60-654%. no RWMAs, G1DD, mild AoV annular calc; b.) TTE 03/19/2017: EF 50-55%, no RWMAs, mild MR; c.) TTE 07/22/2019: EF 55-60%, midl LVH, no RWMAs, triv MR, mild TR   Dizziness 04/30/2021   Dyspnea    GERD (gastroesophageal reflux disease)    Glaucoma    Headache    History of bilateral cataract extraction    HLD (hyperlipidemia)    HTN (hypertension)    Long term current use of clopidogrel    Long-term use of aspirin therapy    MRSA (methicillin resistant staph aureus) culture positive    2006 - 2008   Myocardial infarction (HCC) 04/04/2016   NSTEMI (non-ST elevated myocardial infarction) (HCC) 07/27/2007   a.) LHC 07/30/2007: 30% mLAD, 99% D1,  50% pLCx, 40% mLCx, 70/75/85% dLCx, 50% OM1, 75/80/80 OM2, 50% pRCA, 60% mRCA, 85% dRCA --> not amenable to PCI   OSA on CPAP    Osteoarthritis    PAD (peripheral artery disease) (HCC)    a. 03/2022 Lower Ext Angio: No signif Ao-iliac dzs. R PT diff dzs and occluded above ankle. R Pedal arch intact w/ excellent antegrade flow provided by R AT-->no indication for revasc.   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   a.) LIMA-LAD, SVG-RCA, seq SVG-OM1-OM2   S/P tube myringotomy    Skin cancer, basal cell    Skin ulcer of right great toe, unspecified ulcer stage (HCC)    T2DM (type 2 diabetes mellitus) (HCC)    Wears glasses    Wears partial dentures    top    Current Outpatient Medications:    acetaminophen (TYLENOL) 500 MG tablet, Take 1,000 mg by mouth every 6 (six) hours as needed for moderate pain (pain score 4-6)., Disp: , Rfl:    AIMOVIG 140 MG/ML SOAJ, Inject 140 mg into the skin every 30 (thirty) days., Disp: , Rfl:    albuterol (VENTOLIN HFA) 108 (90 Base) MCG/ACT inhaler, Inhale 2 puffs into the lungs every 4 (four) hours as needed for wheezing., Disp: 1 each, Rfl: 0   amoxicillin (AMOXIL) 500  MG tablet, Take 2,000 mg by mouth See admin instructions. Take 4 capsules (2000 mg) by mouth 1 hour prior to dental appointments (Patient not taking: Reported on 01/12/2024), Disp: , Rfl:    aspirin 81 MG EC tablet, Take 81 mg by mouth daily. Swallow whole., Disp: , Rfl:    Calcium Carb-Cholecalciferol (CALCIUM + D3 PO), Take 1 tablet by mouth in the morning and at bedtime., Disp: , Rfl:    carvedilol (COREG) 12.5 MG tablet, Take 12.5 mg by mouth 2 (two) times daily with a meal., Disp: , Rfl:    Cholecalciferol (VITAMIN D) 50 MCG (2000 UT) tablet, Take 2,000 Units by mouth in the morning and at bedtime., Disp: , Rfl:    clopidogrel (PLAVIX) 75 MG tablet, Take 1 tablet (75 mg total) by mouth daily. (Patient taking differently: Take 75 mg by mouth every evening.),  Disp: 30 tablet, Rfl: 0   doxycycline (VIBRA-TABS) 100 MG tablet, Take 1 tablet (100 mg total) by mouth 2 (two) times daily. (Patient not taking: Reported on 01/12/2024), Disp: 20 tablet, Rfl: 0   doxycycline (VIBRA-TABS) 100 MG tablet, Take 1 tablet (100 mg total) by mouth 2 (two) times daily., Disp: 60 tablet, Rfl: 0   famotidine (ACID CONTROLLER) 10 MG tablet, Take 10 mg by mouth in the morning., Disp: , Rfl:    fluticasone (FLONASE) 50 MCG/ACT nasal spray, Place 1 spray into both nostrils daily as needed for allergies., Disp: , Rfl:    furosemide (LASIX) 40 MG tablet, Take 1 tablet (40 mg total) by mouth daily., Disp: 30 tablet, Rfl: 1   glucagon 1 MG injection, Inject 1 mg into the skin once as needed., Disp: , Rfl:    GLUTOSE 15 40 % GEL, Take 1 Tube by mouth once as needed for low blood sugar., Disp: , Rfl:    insulin aspart (NOVOLOG) 100 UNIT/ML injection, Inject 20 Units into the skin 3 (three) times daily. (Patient taking differently: Inject 10-20 Units into the skin 3 (three) times daily with meals. Sliding Scale will go up to 25 units for BG > 300 - 400), Disp: 10 mL, Rfl: 5   insulin glargine-yfgn (SEMGLEE) 100 UNIT/ML injection, Inject 20 Units into the skin at bedtime., Disp: , Rfl:    isosorbide mononitrate (IMDUR) 60 MG 24 hr tablet, Take 60 mg by mouth in the morning and at bedtime., Disp: , Rfl:    loratadine (CLARITIN) 10 MG tablet, Take 10 mg by mouth in the morning., Disp: , Rfl:    nitroGLYCERIN (NITROSTAT) 0.4 MG SL tablet, DISSOLVE 1 TABLET UNDER THE TONGUE EVERY 5 MINUTES AS NEEDED FOR CHEST PAIN, Disp: 30 tablet, Rfl: 0   oxyCODONE-acetaminophen (PERCOCET) 5-325 MG tablet, Take 1 tablet by mouth every 4 (four) hours as needed for severe pain (pain score 7-10). (Patient not taking: Reported on 01/28/2024), Disp: 20 tablet, Rfl: 0   potassium chloride SA (K-DUR,KLOR-CON) 20 MEQ tablet, Take 1 tablet (20 mEq total) by mouth daily., Disp: 30 tablet, Rfl: 1   rosuvastatin (CRESTOR)  20 MG tablet, Take 1 tablet (20 mg total) by mouth daily at 6 PM., Disp: 30 tablet, Rfl: 1   Travoprost, BAK Free, (TRAVATAN) 0.004 % SOLN ophthalmic solution, Place 1 drop into both eyes at bedtime. , Disp: , Rfl:    vitamin C (ASCORBIC ACID) 500 MG tablet, Take 500 mg by mouth 2 (two) times daily. , Disp: , Rfl:   Social History   Tobacco Use  Smoking Status Former  Current packs/day: 0.00   Average packs/day: 1 pack/day for 16.7 years (16.7 ttl pk-yrs)   Types: Cigarettes   Start date: 11/03/1969   Quit date: 07/22/1986   Years since quitting: 37.5  Smokeless Tobacco Never    Allergies  Allergen Reactions   Latex Other (See Comments)    Blister and burning (9M insulin pump latex band aid )   Nitroglycerin Nausea Only and Other (See Comments)    Patches only - headache    Statins Other (See Comments)    BODY PAIN, Pt taking Crestor*      Fluvastatin Other (See Comments)    Muscle pain   Fluvastatin Sodium Other (See Comments)    Myalgias   Percocet [Oxycodone-Acetaminophen] Itching   Rosuvastatin Other (See Comments)    Muscle pain, Myalgias   Simvastatin Other (See Comments)    Muscle pain, Myalgias   Lipitor [Atorvastatin] Other (See Comments) and Rash    BODY PAIN   Lisinopril Other (See Comments) and Nausea Only    Light headed, dizzy Other reaction(s): Dizziness   Zetia [Ezetimibe] Other (See Comments)    Muscle Pain   Objective:  There were no vitals filed for this visit. There is no height or weight on file to calculate BMI. Constitutional Well developed. Well nourished.  Vascular Dorsalis pedis pulses palpable bilaterally. Posterior tibial pulses palpable bilaterally. Capillary refill normal to all digits.  No cyanosis or clubbing noted. Pedal hair growth normal.  Neurologic Normal speech. Oriented to person, place, and time. Protective sensation absent  Dermatologic Wound Location: Left transmetatarsal amputation site wound fibrogranular wound bed.   No clinical signs of infection noted no complication noted Wound Base: Mixed Granular/Fibrotic Peri-wound: Calloused Exudate: Scant/small amount Serosanguinous exudate Wound Measurements: -See below  Orthopedic: No pain to palpation either foot.    Radiographs: None Assessment:  No diagnosis found. Plan:  Patient was evaluated and treated and all questions answered.  Ulcer left transmetatarsal amputation site wound -Debridement as below. -Dressed with Betadine wet-to-dry dressing, DSD. -Continue off-loading with surgical shoe. -Patient had recent ABIs PVRs and was follow-up with vascular surgery for possible angiogram.  After the angiogram patient will benefit from grafting will work on the approval  Procedure: Excisional Debridement of Wound Tool: Sharp chisel blade/tissue nipper Rationale: Removal of non-viable soft tissue from the wound to promote healing.  Anesthesia: none Pre-Debridement Wound Measurements: 4.2 cm cm x by 3.5 cm cm x 0.3 cm  Post-Debridement Wound Measurements: 4.2 cm cm x by 3.5 cm cm x by 0.3 cm cm  Type of Debridement: Sharp Excisional Tissue Removed: Non-viable soft tissue Blood loss: Minimal (<50cc) Depth of Debridement: subcutaneous tissue. Technique: Sharp excisional debridement to bleeding, viable wound base.  Wound Progress: This my initial evaluation will continue to monitor progression of the wound Site healing conversation 7 Dressing: Dry, sterile, compression dressing. Disposition: Patient tolerated procedure well. Patient to return in 1 week for follow-up.  No follow-ups on file.

## 2024-01-29 ENCOUNTER — Telehealth (INDEPENDENT_AMBULATORY_CARE_PROVIDER_SITE_OTHER): Payer: Self-pay

## 2024-01-29 NOTE — Telephone Encounter (Signed)
 Spoke with the patient and he is scheduled with Dr. Gilda Crease for a left leg angio on 02/10/24 with a 1:00 pm arrival time to the [facility]. Pre-procedure instructions were discussed and will be sent to Mychart and mailed.

## 2024-02-02 ENCOUNTER — Encounter: Admitting: Podiatry

## 2024-02-09 ENCOUNTER — Ambulatory Visit
Admission: RE | Admit: 2024-02-09 | Discharge: 2024-02-09 | Disposition: A | Discharge status: Home / Self Care | Attending: Vascular Surgery | Admitting: Vascular Surgery

## 2024-02-09 ENCOUNTER — Encounter: Payer: Self-pay | Admitting: Vascular Surgery

## 2024-02-09 ENCOUNTER — Other Ambulatory Visit: Payer: Self-pay

## 2024-02-09 ENCOUNTER — Encounter
Admission: RE | Disposition: A | Discharge status: Home / Self Care | Payer: Self-pay | Source: Home / Self Care | Attending: Vascular Surgery

## 2024-02-09 DIAGNOSIS — L97529 Non-pressure chronic ulcer of other part of left foot with unspecified severity: Secondary | ICD-10-CM | POA: Insufficient documentation

## 2024-02-09 DIAGNOSIS — I10 Essential (primary) hypertension: Secondary | ICD-10-CM | POA: Diagnosis not present

## 2024-02-09 DIAGNOSIS — I25119 Atherosclerotic heart disease of native coronary artery with unspecified angina pectoris: Secondary | ICD-10-CM | POA: Diagnosis not present

## 2024-02-09 DIAGNOSIS — Z87891 Personal history of nicotine dependence: Secondary | ICD-10-CM | POA: Insufficient documentation

## 2024-02-09 DIAGNOSIS — Z8249 Family history of ischemic heart disease and other diseases of the circulatory system: Secondary | ICD-10-CM | POA: Diagnosis not present

## 2024-02-09 DIAGNOSIS — Z79899 Other long term (current) drug therapy: Secondary | ICD-10-CM | POA: Insufficient documentation

## 2024-02-09 DIAGNOSIS — Z539 Procedure and treatment not carried out, unspecified reason: Secondary | ICD-10-CM | POA: Insufficient documentation

## 2024-02-09 DIAGNOSIS — I6523 Occlusion and stenosis of bilateral carotid arteries: Secondary | ICD-10-CM | POA: Diagnosis not present

## 2024-02-09 DIAGNOSIS — E1151 Type 2 diabetes mellitus with diabetic peripheral angiopathy without gangrene: Secondary | ICD-10-CM | POA: Diagnosis present

## 2024-02-09 DIAGNOSIS — L97909 Non-pressure chronic ulcer of unspecified part of unspecified lower leg with unspecified severity: Secondary | ICD-10-CM

## 2024-02-09 DIAGNOSIS — I7025 Atherosclerosis of native arteries of other extremities with ulceration: Secondary | ICD-10-CM | POA: Insufficient documentation

## 2024-02-09 DIAGNOSIS — E785 Hyperlipidemia, unspecified: Secondary | ICD-10-CM | POA: Diagnosis not present

## 2024-02-09 DIAGNOSIS — E11621 Type 2 diabetes mellitus with foot ulcer: Secondary | ICD-10-CM | POA: Diagnosis not present

## 2024-02-09 LAB — GLUCOSE, CAPILLARY: Glucose-Capillary: 130 mg/dL — ABNORMAL HIGH (ref 70–99)

## 2024-02-09 LAB — CREATININE, SERUM
Creatinine, Ser: 1.11 mg/dL (ref 0.61–1.24)
GFR, Estimated: >60 mL/min (ref >60)

## 2024-02-09 LAB — BUN: BUN: 28 mg/dL — ABNORMAL HIGH (ref 8–23)

## 2024-02-09 SURGERY — LOWER EXTREMITY INTERVENTION
Anesthesia: Moderate Sedation | Site: Leg Lower | Laterality: Left

## 2024-02-09 MED ORDER — ONDANSETRON HCL 4 MG/2ML IJ SOLN: 4.0000 mg | Freq: Four times a day (QID) | INTRAMUSCULAR | Status: DC | PRN

## 2024-02-09 MED ORDER — METHYLPREDNISOLONE SODIUM SUCC 125 MG IJ SOLR: 125.0000 mg | Freq: Once | INTRAMUSCULAR | Status: DC | PRN

## 2024-02-09 MED ORDER — HYDROMORPHONE HCL 1 MG/ML IJ SOLN: 1.0000 mg | Freq: Once | INTRAMUSCULAR | Status: DC | PRN

## 2024-02-09 MED ORDER — DIPHENHYDRAMINE HCL 50 MG/ML IJ SOLN: 50.0000 mg | Freq: Once | INTRAMUSCULAR | Status: DC | PRN

## 2024-02-09 MED ORDER — CEFAZOLIN SODIUM-DEXTROSE 2-4 GM/100ML-% IV SOLN: 2.0000 g | INTRAVENOUS | Status: DC

## 2024-02-09 MED ORDER — SODIUM CHLORIDE 0.9 % IV SOLN: INTRAVENOUS | Status: DC

## 2024-02-09 MED ORDER — MIDAZOLAM HCL 2 MG/ML PO SYRP: 8.0000 mg | ORAL_SOLUTION | Freq: Once | ORAL | Status: DC | PRN

## 2024-02-09 MED ORDER — FAMOTIDINE 20 MG PO TABS: 40.0000 mg | ORAL_TABLET | Freq: Once | ORAL | Status: DC | PRN

## 2024-02-09 MED ORDER — CEFAZOLIN SODIUM-DEXTROSE 2-4 GM/100ML-% IV SOLN
INTRAVENOUS | Status: AC
  Filled 2024-02-09: qty 100

## 2024-02-09 NOTE — Progress Notes (Signed)
 Informed patient that [facility] will be in contact to reschedule todays procedure. Procedure cancelled. IV removed. Pt discharged with wife.

## 2024-02-09 NOTE — Interval H&P Note (Signed)
 History and Physical Interval Note:  02/09/2024 1:00 PM  Lawrence [name]  has presented today for surgery, with the diagnosis of LLE Angio   ASO w ulceration.  The various methods of treatment have been discussed with the patient and family. After consideration of risks, benefits and other options for treatment, the patient has consented to  Procedure(s): LOWER EXTREMITY INTERVENTION (Left) Lower Extremity Angiography (Left) as a surgical intervention.  The patient's history has been reviewed, patient examined, no change in status, stable for surgery.  I have reviewed the patient's chart and labs.  Questions were answered to the patient's satisfaction.     Levora Dredge

## 2024-02-10 ENCOUNTER — Telehealth (INDEPENDENT_AMBULATORY_CARE_PROVIDER_SITE_OTHER): Payer: Self-pay

## 2024-02-10 NOTE — Telephone Encounter (Signed)
 Spoke with the patient and he has been rescheduled with Dr. Gilda Crease for a left leg angio on 02/23/24 with a 11:00 am arrival time to the [facility]. Pre-procedure instructions will be sent to Mychart.

## 2024-02-11 ENCOUNTER — Ambulatory Visit (INDEPENDENT_AMBULATORY_CARE_PROVIDER_SITE_OTHER): Admitting: Podiatry

## 2024-02-11 DIAGNOSIS — Z89432 Acquired absence of left foot: Secondary | ICD-10-CM

## 2024-02-11 DIAGNOSIS — L97522 Non-pressure chronic ulcer of other part of left foot with fat layer exposed: Secondary | ICD-10-CM

## 2024-02-11 NOTE — Progress Notes (Signed)
 Subjective:  Patient ID: Lawrence [name], male    DOB: August 24, 1951,  MRN: [id number]  Chief Complaint  Patient presents with   Foot Ulcer    72 y.o. male presents for wound care.  Patient is for follow-up of left transmetatarsal amputation site wound he is doing a lot better has healed denies any other acute complaints.  Review of Systems: Negative except as noted in the HPI. Denies N/V/F/Ch.  Past Medical History:  Diagnosis Date   Allergies    Anginal pain (HCC)    Bilateral carotid artery disease (HCC)    Breast pain, left 08/29/2022   CAD (coronary artery disease) 07/2007   a.) s/p NSTEMI 07/2007 - sig multi-vessel CAD not amenable to PCI; b.) LHC 06/11/2016: multi vessel CAD not amenable to PCI --> refer to CVTS; c.) s/p 4v CABG 06/27/2016; d.) LHC 06/09/2023: 75% mLM, 80% o-pLAD, 60% mLAD, 100% p-mLCx, 100% OM1, 95% pRCA, 100% mRCA, 50% m-dLAD. LIMA-LAD and SVG-OM1 patent. CTO SVG-RCA with L-R collaterals - med mgmt   Carpal tunnel syndrome on left    a.) s/p release 04/2016   CKD (chronic kidney disease), stage III (HCC)    COVID-19 12/09/2022   Diabetic peripheral neuropathy (HCC)    Diastolic dysfunction    a.) TTE 06/25/2016: EF 60-654%. no RWMAs, G1DD, mild AoV annular calc; b.) TTE 03/19/2017: EF 50-55%, no RWMAs, mild MR; c.) TTE 07/22/2019: EF 55-60%, midl LVH, no RWMAs, triv MR, mild TR   Dizziness 04/30/2021   Dyspnea    GERD (gastroesophageal reflux disease)    Glaucoma    Headache    History of bilateral cataract extraction    HLD (hyperlipidemia)    HTN (hypertension)    Long term current use of clopidogrel    Long-term use of aspirin therapy    MRSA (methicillin resistant staph aureus) culture positive    2006 - 2008   Myocardial infarction (HCC) 04/04/2016   NSTEMI (non-ST elevated myocardial infarction) (HCC) 07/27/2007   a.) LHC 07/30/2007: 30% mLAD, 99% D1, 50% pLCx, 40% mLCx, 70/75/85% dLCx, 50% OM1, 75/80/80 OM2, 50% pRCA, 60% mRCA, 85% dRCA --> not  amenable to PCI   OSA on CPAP    Osteoarthritis    PAD (peripheral artery disease) (HCC)    a. 03/2022 Lower Ext Angio: No signif Ao-iliac dzs. R PT diff dzs and occluded above ankle. R Pedal arch intact w/ excellent antegrade flow provided by R AT-->no indication for revasc.   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   a.) LIMA-LAD, SVG-RCA, seq SVG-OM1-OM2   S/P tube myringotomy    Skin cancer, basal cell    Skin ulcer of right great toe, unspecified ulcer stage (HCC)    T2DM (type 2 diabetes mellitus) (HCC)    Wears glasses    Wears partial dentures    top    Current Outpatient Medications:    acetaminophen (TYLENOL) 500 MG tablet, Take 1,000 mg by mouth every 6 (six) hours as needed for moderate pain (pain score 4-6)., Disp: , Rfl:    AIMOVIG 140 MG/ML SOAJ, Inject 140 mg into the skin every 30 (thirty) days., Disp: , Rfl:    albuterol (VENTOLIN HFA) 108 (90 Base) MCG/ACT inhaler, Inhale 2 puffs into the lungs every 4 (four) hours as needed for wheezing., Disp: 1 each, Rfl: 0   amoxicillin (AMOXIL) 500 MG tablet, Take 2,000 mg by mouth See admin instructions. Take 4 capsules (2000 mg) by  mouth 1 hour prior to dental appointments (Patient not taking: Reported on 01/12/2024), Disp: , Rfl:    aspirin 81 MG EC tablet, Take 81 mg by mouth daily. Swallow whole., Disp: , Rfl:    Calcium Carb-Cholecalciferol (CALCIUM + D3 PO), Take 1 tablet by mouth in the morning and at bedtime., Disp: , Rfl:    carvedilol (COREG) 12.5 MG tablet, Take 12.5 mg by mouth 2 (two) times daily with a meal., Disp: , Rfl:    Cholecalciferol (VITAMIN D) 50 MCG (2000 UT) tablet, Take 2,000 Units by mouth in the morning and at bedtime., Disp: , Rfl:    clopidogrel (PLAVIX) 75 MG tablet, Take 1 tablet (75 mg total) by mouth daily. (Patient taking differently: Take 75 mg by mouth every evening.), Disp: 30 tablet, Rfl: 0   doxycycline (VIBRA-TABS) 100 MG tablet, Take 1 tablet (100 mg total)  by mouth 2 (two) times daily. (Patient not taking: Reported on 01/12/2024), Disp: 20 tablet, Rfl: 0   doxycycline (VIBRA-TABS) 100 MG tablet, Take 1 tablet (100 mg total) by mouth 2 (two) times daily., Disp: 60 tablet, Rfl: 0   famotidine (ACID CONTROLLER) 10 MG tablet, Take 10 mg by mouth in the morning., Disp: , Rfl:    fluticasone (FLONASE) 50 MCG/ACT nasal spray, Place 1 spray into both nostrils daily as needed for allergies., Disp: , Rfl:    furosemide (LASIX) 40 MG tablet, Take 1 tablet (40 mg total) by mouth daily., Disp: 30 tablet, Rfl: 1   glucagon 1 MG injection, Inject 1 mg into the skin once as needed., Disp: , Rfl:    GLUTOSE 15 40 % GEL, Take 1 Tube by mouth once as needed for low blood sugar., Disp: , Rfl:    insulin aspart (NOVOLOG) 100 UNIT/ML injection, Inject 20 Units into the skin 3 (three) times daily. (Patient taking differently: Inject 10-20 Units into the skin 3 (three) times daily with meals. Sliding Scale will go up to 25 units for BG > 300 - 400), Disp: 10 mL, Rfl: 5   insulin glargine-yfgn (SEMGLEE) 100 UNIT/ML injection, Inject 20 Units into the skin at bedtime., Disp: , Rfl:    isosorbide mononitrate (IMDUR) 60 MG 24 hr tablet, Take 60 mg by mouth in the morning and at bedtime., Disp: , Rfl:    loratadine (CLARITIN) 10 MG tablet, Take 10 mg by mouth in the morning., Disp: , Rfl:    nitroGLYCERIN (NITROSTAT) 0.4 MG SL tablet, DISSOLVE 1 TABLET UNDER THE TONGUE EVERY 5 MINUTES AS NEEDED FOR CHEST PAIN, Disp: 30 tablet, Rfl: 0   oxyCODONE-acetaminophen (PERCOCET) 5-325 MG tablet, Take 1 tablet by mouth every 4 (four) hours as needed for severe pain (pain score 7-10). (Patient not taking: Reported on 01/28/2024), Disp: 20 tablet, Rfl: 0   potassium chloride SA (K-DUR,KLOR-CON) 20 MEQ tablet, Take 1 tablet (20 mEq total) by mouth daily., Disp: 30 tablet, Rfl: 1   rosuvastatin (CRESTOR) 20 MG tablet, Take 1 tablet (20 mg total) by mouth daily at 6 PM., Disp: 30 tablet, Rfl: 1    Travoprost, BAK Free, (TRAVATAN) 0.004 % SOLN ophthalmic solution, Place 1 drop into both eyes at bedtime. , Disp: , Rfl:    vitamin C (ASCORBIC ACID) 500 MG tablet, Take 500 mg by mouth 2 (two) times daily. , Disp: , Rfl:   Social History   Tobacco Use  Smoking Status Former   Current packs/day: 0.00   Average packs/day: 1 pack/day for 16.7 years (16.7 ttl pk-yrs)  Types: Cigarettes   Start date: 11/03/1969   Quit date: 07/22/1986   Years since quitting: 37.5  Smokeless Tobacco Never    Allergies  Allergen Reactions   Latex Other (See Comments)    Blister and burning (31M insulin pump latex band aid )   Nitroglycerin Nausea Only and Other (See Comments)    Patches only - headache    Statins Other (See Comments)    BODY PAIN, Pt taking Crestor*      Fluvastatin Other (See Comments)    Muscle pain   Fluvastatin Sodium Other (See Comments)    Myalgias   Percocet [Oxycodone-Acetaminophen] Itching   Rosuvastatin Other (See Comments)    Muscle pain, Myalgias   Simvastatin Other (See Comments)    Muscle pain, Myalgias   Lipitor [Atorvastatin] Other (See Comments) and Rash    BODY PAIN   Lisinopril Other (See Comments) and Nausea Only    Light headed, dizzy Other reaction(s): Dizziness   Zetia [Ezetimibe] Other (See Comments)    Muscle Pain   Objective:  There were no vitals filed for this visit. There is no height or weight on file to calculate BMI. Constitutional Well developed. Well nourished.  Vascular Dorsalis pedis pulses palpable bilaterally. Posterior tibial pulses palpable bilaterally. Capillary refill normal to all digits.  No cyanosis or clubbing noted. Pedal hair growth normal.  Neurologic Normal speech. Oriented to person, place, and time. Protective sensation absent  Dermatologic Wound Location: Clinically healed.  Completely reepithelialized no further signs of wound or ulceration noted no signs of infection noted Wound Base: Mixed  Granular/Fibrotic Peri-wound: Calloused Exudate: Scant/small amount Serosanguinous exudate Wound Measurements: -See below  Orthopedic: No pain to palpation either foot.    Radiographs: None Assessment:  No diagnosis found. Plan:  Patient was evaluated and treated and all questions answered.  Ulcer left transmetatarsal amputation site wound - Clinically healed with Betadine wet-to-dry dressing.  Patient scheduled to undergo angio on 22 April I will see him back for final follow-up afterwards.  At this time the wound has completely reepithelialized.  Discussed prevention technique shoe gear modification he states understanding No follow-ups on file.

## 2024-02-14 NOTE — Op Note (Signed)
 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  None   Anesthesia: Conscious sedation was administered by the interventional radiology RN under my direct supervision. IV Versed plus fentanyl were utilized. Continuous ECG, pulse oximetry and blood pressure was monitored throughout the entire procedure. Conscious sedation was administered for a total of 0 minutes.  Sheath: None  Contrast: 0 cc   Fluoroscopy Time: 0 minutes    Procedure:  Lawrence [name] a 73 y.o. male who was identified in the preop area.  He was prepped for his procedure however an intervening emergency occurred and no procedure was performed.  Patient did not receive sedation.  He did not leave the preoperative area.  He is discharged to home and will be rescheduled.  Lawrence [name] 02/14/2024,4:16 PM

## 2024-02-23 ENCOUNTER — Encounter
Admission: RE | Disposition: A | Discharge status: Home / Self Care | Payer: Self-pay | Source: Home / Self Care | Attending: Vascular Surgery

## 2024-02-23 ENCOUNTER — Encounter: Payer: Self-pay | Admitting: Vascular Surgery

## 2024-02-23 ENCOUNTER — Ambulatory Visit
Admission: RE | Admit: 2024-02-23 | Discharge: 2024-02-23 | Disposition: A | Discharge status: Home / Self Care | Attending: Vascular Surgery | Admitting: Vascular Surgery

## 2024-02-23 ENCOUNTER — Other Ambulatory Visit: Payer: Self-pay

## 2024-02-23 DIAGNOSIS — Z978 Presence of other specified devices: Secondary | ICD-10-CM

## 2024-02-23 DIAGNOSIS — I70222 Atherosclerosis of native arteries of extremities with rest pain, left leg: Secondary | ICD-10-CM

## 2024-02-23 DIAGNOSIS — T8789 Other complications of amputation stump: Secondary | ICD-10-CM

## 2024-02-23 DIAGNOSIS — I129 Hypertensive chronic kidney disease with stage 1 through stage 4 chronic kidney disease, or unspecified chronic kidney disease: Secondary | ICD-10-CM | POA: Diagnosis not present

## 2024-02-23 DIAGNOSIS — Z96652 Presence of left artificial knee joint: Secondary | ICD-10-CM | POA: Diagnosis not present

## 2024-02-23 DIAGNOSIS — E1142 Type 2 diabetes mellitus with diabetic polyneuropathy: Secondary | ICD-10-CM | POA: Insufficient documentation

## 2024-02-23 DIAGNOSIS — E1122 Type 2 diabetes mellitus with diabetic chronic kidney disease: Secondary | ICD-10-CM | POA: Insufficient documentation

## 2024-02-23 DIAGNOSIS — Z87891 Personal history of nicotine dependence: Secondary | ICD-10-CM | POA: Diagnosis not present

## 2024-02-23 DIAGNOSIS — L97529 Non-pressure chronic ulcer of other part of left foot with unspecified severity: Secondary | ICD-10-CM | POA: Insufficient documentation

## 2024-02-23 DIAGNOSIS — I6523 Occlusion and stenosis of bilateral carotid arteries: Secondary | ICD-10-CM | POA: Insufficient documentation

## 2024-02-23 DIAGNOSIS — Z79899 Other long term (current) drug therapy: Secondary | ICD-10-CM | POA: Insufficient documentation

## 2024-02-23 DIAGNOSIS — E11621 Type 2 diabetes mellitus with foot ulcer: Secondary | ICD-10-CM | POA: Insufficient documentation

## 2024-02-23 DIAGNOSIS — I25119 Atherosclerotic heart disease of native coronary artery with unspecified angina pectoris: Secondary | ICD-10-CM | POA: Insufficient documentation

## 2024-02-23 DIAGNOSIS — I70299 Other atherosclerosis of native arteries of extremities, unspecified extremity: Secondary | ICD-10-CM

## 2024-02-23 DIAGNOSIS — Z89432 Acquired absence of left foot: Secondary | ICD-10-CM | POA: Diagnosis not present

## 2024-02-23 DIAGNOSIS — E785 Hyperlipidemia, unspecified: Secondary | ICD-10-CM | POA: Insufficient documentation

## 2024-02-23 DIAGNOSIS — N183 Chronic kidney disease, stage 3 unspecified: Secondary | ICD-10-CM | POA: Insufficient documentation

## 2024-02-23 DIAGNOSIS — Z89422 Acquired absence of other left toe(s): Secondary | ICD-10-CM | POA: Insufficient documentation

## 2024-02-23 DIAGNOSIS — I70245 Atherosclerosis of native arteries of left leg with ulceration of other part of foot: Secondary | ICD-10-CM | POA: Diagnosis not present

## 2024-02-23 DIAGNOSIS — E1151 Type 2 diabetes mellitus with diabetic peripheral angiopathy without gangrene: Secondary | ICD-10-CM | POA: Diagnosis present

## 2024-02-23 HISTORY — PX: LOWER EXTREMITY INTERVENTION: CATH118252

## 2024-02-23 HISTORY — PX: LOWER EXTREMITY ANGIOGRAPHY: CATH118251

## 2024-02-23 LAB — GLUCOSE, CAPILLARY
Glucose-Capillary: 142 mg/dL — ABNORMAL HIGH (ref 70–99)
Glucose-Capillary: 106 mg/dL — ABNORMAL HIGH (ref 70–99)

## 2024-02-23 LAB — CREATININE, SERUM
Creatinine, Ser: 1.09 mg/dL (ref 0.61–1.24)
GFR, Estimated: >60 mL/min (ref >60)

## 2024-02-23 LAB — BUN: BUN: 28 mg/dL — ABNORMAL HIGH (ref 8–23)

## 2024-02-23 SURGERY — LOWER EXTREMITY INTERVENTION
Anesthesia: Moderate Sedation | Site: Leg Lower | Laterality: Left

## 2024-02-23 MED ORDER — SODIUM CHLORIDE 0.9% FLUSH: 3.0000 mL | INTRAVENOUS | Status: DC | PRN

## 2024-02-23 MED ORDER — HYDROMORPHONE HCL 1 MG/ML IJ SOLN: 1.0000 mg | Freq: Once | INTRAMUSCULAR | Status: DC | PRN

## 2024-02-23 MED ORDER — HEPARIN SODIUM (PORCINE) 1000 UNIT/ML IJ SOLN
INTRAMUSCULAR | Status: AC
  Filled 2024-02-23: qty 10

## 2024-02-23 MED ORDER — FENTANYL CITRATE (PF) 100 MCG/2ML IJ SOLN
INTRAMUSCULAR | Status: DC | PRN
  Administered 2024-02-23: 50 ug via INTRAVENOUS
  Administered 2024-02-23 (×4): 25 ug via INTRAVENOUS

## 2024-02-23 MED ORDER — MIDAZOLAM HCL 2 MG/ML PO SYRP: 8.0000 mg | ORAL_SOLUTION | Freq: Once | ORAL | Status: DC | PRN

## 2024-02-23 MED ORDER — MORPHINE SULFATE (PF) 4 MG/ML IV SOLN: 2.0000 mg | INTRAVENOUS | Status: DC | PRN

## 2024-02-23 MED ORDER — MIDAZOLAM HCL 5 MG/5ML IJ SOLN
INTRAMUSCULAR | Status: AC
  Filled 2024-02-23: qty 5

## 2024-02-23 MED ORDER — HYDRALAZINE HCL 20 MG/ML IJ SOLN: 5.0000 mg | INTRAMUSCULAR | Status: DC | PRN

## 2024-02-23 MED ORDER — ONDANSETRON HCL 4 MG/2ML IJ SOLN
4.0000 mg | Freq: Four times a day (QID) | INTRAMUSCULAR | Status: DC | PRN
Start: 2024-02-23 — End: 2024-02-23

## 2024-02-23 MED ORDER — SODIUM CHLORIDE 0.9 % IV SOLN: INTRAVENOUS | Status: DC

## 2024-02-23 MED ORDER — ONDANSETRON HCL 4 MG/2ML IJ SOLN: 4.0000 mg | Freq: Four times a day (QID) | INTRAMUSCULAR | Status: DC | PRN

## 2024-02-23 MED ORDER — FENTANYL CITRATE PF 50 MCG/ML IJ SOSY
PREFILLED_SYRINGE | INTRAMUSCULAR | Status: AC
  Filled 2024-02-23: qty 1

## 2024-02-23 MED ORDER — LABETALOL HCL 5 MG/ML IV SOLN: 10.0000 mg | INTRAVENOUS | Status: DC | PRN

## 2024-02-23 MED ORDER — IODIXANOL 320 MG/ML IV SOLN
INTRAVENOUS | Status: DC | PRN
Start: 2024-02-23 — End: 2024-02-23
  Administered 2024-02-23: 85 mL via INTRA_ARTERIAL

## 2024-02-23 MED ORDER — SODIUM CHLORIDE 0.9 % IV SOLN: 250.0000 mL | INTRAVENOUS | Status: DC | PRN

## 2024-02-23 MED ORDER — LIDOCAINE HCL (PF) 1 % IJ SOLN
INTRAMUSCULAR | Status: DC | PRN
  Administered 2024-02-23: 10 mL

## 2024-02-23 MED ORDER — DIPHENHYDRAMINE HCL 50 MG/ML IJ SOLN: 50.0000 mg | Freq: Once | INTRAMUSCULAR | Status: DC | PRN

## 2024-02-23 MED ORDER — HEPARIN SODIUM (PORCINE) 1000 UNIT/ML IJ SOLN
INTRAMUSCULAR | Status: DC | PRN
Start: 2024-02-23 — End: 2024-02-23
  Administered 2024-02-23: 6000 [IU] via INTRAVENOUS

## 2024-02-23 MED ORDER — FENTANYL CITRATE (PF) 100 MCG/2ML IJ SOLN
INTRAMUSCULAR | Status: AC
  Filled 2024-02-23: qty 2

## 2024-02-23 MED ORDER — CEFAZOLIN SODIUM-DEXTROSE 2-4 GM/100ML-% IV SOLN
INTRAVENOUS | Status: AC
  Filled 2024-02-23: qty 100

## 2024-02-23 MED ORDER — HEPARIN (PORCINE) IN NACL 2000-0.9 UNIT/L-% IV SOLN
INTRAVENOUS | Status: DC | PRN
  Administered 2024-02-23: 1000 mL

## 2024-02-23 MED ORDER — FAMOTIDINE 20 MG PO TABS: 40.0000 mg | ORAL_TABLET | Freq: Once | ORAL | Status: DC | PRN

## 2024-02-23 MED ORDER — SODIUM CHLORIDE 0.9% FLUSH
3.0000 mL | Freq: Two times a day (BID) | INTRAVENOUS | Status: DC
Start: 2024-02-23 — End: 2024-02-23

## 2024-02-23 MED ORDER — MIDAZOLAM HCL 2 MG/2ML IJ SOLN
INTRAMUSCULAR | Status: DC | PRN
  Administered 2024-02-23 (×3): 1 mg via INTRAVENOUS
  Administered 2024-02-23 (×2): .5 mg via INTRAVENOUS

## 2024-02-23 MED ORDER — METHYLPREDNISOLONE SODIUM SUCC 125 MG IJ SOLR: 125.0000 mg | Freq: Once | INTRAMUSCULAR | Status: DC | PRN

## 2024-02-23 MED ORDER — CEFAZOLIN SODIUM-DEXTROSE 2-4 GM/100ML-% IV SOLN
2.0000 g | INTRAVENOUS | Status: AC
  Administered 2024-02-23: 2 g via INTRAVENOUS

## 2024-02-23 SURGICAL SUPPLY — 23 items
BALLOON ARMADA 2.0X200X150 (BALLOONS) IMPLANT
BALLOON ARMADA 2.5X200X150 (BALLOONS) IMPLANT
BALLOON JADE .0142.5 X 40 (BALLOONS) IMPLANT
BALLOON ULTRSC 014 2.5X200X150 (BALLOONS) IMPLANT
BALLOON ULTRVRSE 3X80X150 OTW (BALLOONS) IMPLANT
CATH ANGIO 5F PIGTAIL 65CM (CATHETERS) IMPLANT
CATH VERT 5FR 125CM (CATHETERS) IMPLANT
COVER PROBE ULTRASOUND 5X96 (MISCELLANEOUS) IMPLANT
DEVICE PRESTO INFLATION (MISCELLANEOUS) IMPLANT
DEVICE STARCLOSE SE CLOSURE (Vascular Products) IMPLANT
GLIDEWIRE ADV .035X260CM (WIRE) IMPLANT
GOWN STRL REUS W/ TWL LRG LVL3 (GOWN DISPOSABLE) ×1 IMPLANT
NDL ENTRY 21GA 7CM ECHOTIP (NEEDLE) IMPLANT
NEEDLE ENTRY 21GA 7CM ECHOTIP (NEEDLE) ×1 IMPLANT
PACK ANGIOGRAPHY (CUSTOM PROCEDURE TRAY) ×1 IMPLANT
SET INTRO CAPELLA COAXIAL (SET/KITS/TRAYS/PACK) IMPLANT
SHEATH BRITE TIP 5FRX11 (SHEATH) IMPLANT
SHEATH RAABE 6FRX70 (SHEATH) IMPLANT
STENT ESPRIT BTK 2.5X38 SCAFF (Permanent Stent) IMPLANT
SYR MEDRAD MARK 7 150ML (SYRINGE) IMPLANT
TUBING CONTRAST HIGH PRESS 72 (TUBING) IMPLANT
WIRE COMMAND ST STR 014 300 (WIRE) IMPLANT
WIRE J 3MM .035X145CM (WIRE) IMPLANT

## 2024-02-23 NOTE — Op Note (Signed)
 [facility]  Percutaneous Study/Intervention Procedural Note   Date of Surgery: 02/23/2024  Surgeon:  Devon Fogo  Pre-operative Diagnosis: Atherosclerotic occlusive disease bilateral lower extremities with rest pain and nonhealing wounds of the left lower extremity  Post-operative diagnosis:  Same  Procedure(s) Performed:             1.  Introduction catheter into left lower extremity 3rd order catheter placement with additional third order catheter placement             2.  Contrast injection left lower extremity for distal runoff with additional 3rd order             3.  Percutaneous transluminal angioplasty and stent placement within Esprit 2.5 mm stent left anterior tibial artery                          4.  Percutaneous transluminal angioplasty to 3 mm left posterior tibial artery and tibioperoneal trunk             5.  Star close closure right common femoral arteriotomy  Anesthesia: Conscious sedation was administered under my direct supervision by the interventional radiology RN. IV Versed  plus fentanyl  were utilized. Continuous ECG, pulse oximetry and blood pressure was monitored throughout the entire procedure.  Conscious sedation was for a total of 90 minutes.  Sheath: 6 [place] Rabie 70 cm right common femoral retrograde  Contrast: 85 cc  Fluoroscopy Time: 12.1 minutes  Indications:  Geneva Kerbs presents with increasing pain of the left lower extremity.  He recently underwent a transmetatarsal amputation but this has been nonhealing.  This suggests the patient is having limb threatening ischemia and places him at high risk for limb loss.  Angiography with the hope for intervention for limb salvage has been recommended.  The risks and benefits are reviewed all questions answered patient agrees to proceed.  Procedure: TREVAN MESSMAN is a 73 y.o. y.o. male who was identified and appropriate procedural time out was performed.  The patient was then  placed supine on the table and prepped and draped in the usual sterile fashion.    Ultrasound was placed in the sterile sleeve and the right groin was evaluated the right common femoral artery was echolucent and pulsatile indicating patency.  Image was recorded for the permanent record and under real-time visualization a microneedle was inserted into the common femoral artery followed by the microwire and then the micro-sheath.  A J-wire was then advanced through the micro-sheath and a  5 [place] sheath was then inserted over a J-wire. J-wire was then advanced and a 5 French pigtail catheter was positioned at the level of T12.  AP projection of the aorta was then obtained. Pigtail catheter was repositioned to above the bifurcation and a RAO view of the pelvis was obtained.  Subsequently a pigtail catheter with the stiff angle Glidewire was used to cross the aortic bifurcation the catheter wire were advanced down into the left distal external iliac artery. Oblique view of the femoral bifurcation was then obtained and subsequently the wire was reintroduced and the pigtail catheter negotiated into the SFA representing third order catheter placement. Distal runoff was then performed.  The patient's leg was also flexed and rotated externally and the camera angled so that we could image the popliteal artery behind the knee secondary to his knee prosthesis.  6000 units of heparin  was then given and allowed to circulate and a  6 [place] Rabie 70 cm sheath was advanced up and over the bifurcation and positioned in the femoral artery  Straight catheter and stiff angle Glidewire were then negotiated down into the anterior tibial. Catheter was then advanced. Hand injection contrast demonstrated the anterior tibial anatomy in detail.  2 mm x 200 mm balloon was used to angioplasty the anterior tibial.  The inflation was for 1 minute at 10 atm.  Follow-up imaging demonstrated greater than 60% residual stenosis at multiple  locations and a 2.5 mm x 200 mm balloon was advanced across the midportion of the anterior tibial head inflated to 10 atm for 1 minute.  Follow-up imaging demonstrated greater than 50% residual stenosis at 1 particular lesion in the midportion.  There was a second more proximal lesion that demonstrated approximately 30 to 40% residual stenosis.  An Esprit 2.5 mm x 38 mm stent was then advanced across the mid stenosis and deployed without difficulty.  Follow-up imaging demonstrated a persistent greater than 60% stenosis in its midportion and a noncompliant 2.5 mm x 40 mm Jade balloon was advanced across this and inflated to 20 atm for approximately 1 minute.  At 20 atm you could see the lesion yield.  The balloon was then deflated and repositioned to the more proximal lesion and inflated to 16 atm for 1 minute.  Follow-up imaging demonstrated excellent patency with less than 10% residual stenosis throughout the anterior tibial.  The detector was then repositioned and the distal popliteal was re-imaged.   Greater than 60% stenosis is noted in the distal tibioperoneal trunk and origin of the posterior tibial.  This was best identified in the flexed image that we obtained to evaluate the popliteal behind the knee prosthesis. A 3 mm x 80 mm ultra versus balloon was then advanced across the lesion and inflated to 16 atm for 1 minute. Follow-up imaging demonstrated patency with excellent result and less than 10% residual stenosis. Distal runoff was then reassessed and noted to be widely patent there is now marked improvement in the filling of the pedal arch via the dorsalis pedis.    After review of these images the sheath is pulled into the right external iliac oblique of the common femoral is obtained and a Star close device deployed. There no immediate Complications.  Findings:  The abdominal aorta is opacified with a bolus injection contrast. Renal arteries are single and widely patent. The aorta itself has  diffuse disease but no hemodynamically significant lesions. The common and external iliac arteries are widely patent bilaterally.  The left common femoral is widely patent as is the profunda femoris.  The SFA and popliteal are widely patent.  In the flexed image of the knee there is an area that appears to have some external compression but this only appears to be 30% or less.  It is associated with a surgical clip external to the artery.  The trifurcation is diseased with occlusion of the anterior tibial and its midportion, the distal tibioperoneal trunk demonstrates increasing disease that becomes approximately 60% extending into the origin of the posterior tibial.  The peroneal is patent but of small caliber and reaches the ankle but does not collateralized at all.  Following angioplasty and stent placement the anterior tibial now is now widely patent and demonstrates in-line flow with less than 10% residual stenosis and a marked improvement in the filling of the dorsalis pedis and pedal arch.  Angioplasty of the distal tibioperoneal trunk and posterior tibial also yields an excellent  result with less than 10% residual stenosis.  Summary: Successful recanalization left lower extremity for limb salvage                           Disposition: Patient was taken to the recovery room in stable condition having tolerated the procedure well.  Theotis Flake Rayquon Uselman 02/23/2024,1:55 PM

## 2024-02-23 NOTE — Interval H&P Note (Signed)
 History and Physical Interval Note:  02/23/2024 11:26 AM  Lawrence [name]  has presented today for surgery, with the diagnosis of LLE Angio    ASO w ulceration.  The various methods of treatment have been discussed with the patient and family. After consideration of risks, benefits and other options for treatment, the patient has consented to  Procedure(s): LOWER EXTREMITY INTERVENTION (Left) Lower Extremity Angiography (Left) as a surgical intervention.  The patient's history has been reviewed, patient examined, no change in status, stable for surgery.  I have reviewed the patient's chart and labs.  Questions were answered to the patient's satisfaction.     Devon Fogo

## 2024-02-24 ENCOUNTER — Encounter: Payer: Self-pay | Admitting: Vascular Surgery

## 2024-03-01 ENCOUNTER — Ambulatory Visit (INDEPENDENT_AMBULATORY_CARE_PROVIDER_SITE_OTHER): Admitting: Podiatry

## 2024-03-01 ENCOUNTER — Encounter: Payer: Self-pay | Admitting: Podiatry

## 2024-03-01 DIAGNOSIS — L03116 Cellulitis of left lower limb: Secondary | ICD-10-CM

## 2024-03-01 DIAGNOSIS — L97512 Non-pressure chronic ulcer of other part of right foot with fat layer exposed: Secondary | ICD-10-CM | POA: Diagnosis not present

## 2024-03-01 NOTE — Progress Notes (Signed)
 Chief Complaint  Patient presents with   Routine Post Op    POV #3, DOS 12/04/23,TRANSMETATARSAL AMPUTATION LT, TENDO ACHILLES LENGTHENING LT, PARTIAL TOE AMPUTATION RT THIRD "Pretty good is what my wife says.  The right one needs to be cleaned up.  They did an Angio on the left leg and they put a stent in it."    Subjective:  Patient presents today status post TMA with TAL left.  Partial toe amputation RT third.  DOS: 12/04/2023.  Since last visit the patient has had an episode of cellulitis to the foot.  He was seen by another physician here in our practice on 02/11/2024 and prescribed antibiotics which seem to resolve the cellulitis.  Also had recent vascular procedure done recently.  Past Medical History:  Diagnosis Date   Allergies    Anginal pain (HCC)    Bilateral carotid artery disease (HCC)    Breast pain, left 08/29/2022   CAD (coronary artery disease) 07/2007   a.) s/p NSTEMI 07/2007 - sig multi-vessel CAD not amenable to PCI; b.) LHC 06/11/2016: multi vessel CAD not amenable to PCI --> refer to CVTS; c.) s/p 4v CABG 06/27/2016; d.) LHC 06/09/2023: 75% mLM, 80% o-pLAD, 60% mLAD, 100% p-mLCx, 100% OM1, 95% pRCA, 100% mRCA, 50% m-dLAD. LIMA-LAD and SVG-OM1 patent. CTO SVG-RCA with L-R collaterals - med mgmt   Carpal tunnel syndrome on left    a.) s/p release 04/2016   CKD (chronic kidney disease), stage III (HCC)    COVID-19 12/09/2022   Diabetic peripheral neuropathy (HCC)    Diastolic dysfunction    a.) TTE 06/25/2016: EF 60-654%. no RWMAs, G1DD, mild AoV annular calc; b.) TTE 03/19/2017: EF 50-55%, no RWMAs, mild MR; c.) TTE 07/22/2019: EF 55-60%, midl LVH, no RWMAs, triv MR, mild TR   Dizziness 04/30/2021   Dyspnea    GERD (gastroesophageal reflux disease)    Glaucoma    Headache    History of bilateral cataract extraction    HLD (hyperlipidemia)    HTN (hypertension)    Long term current use of clopidogrel     Long-term use of aspirin  therapy    MRSA (methicillin  resistant staph aureus) culture positive    2006 - 2008   Myocardial infarction (HCC) 04/04/2016   NSTEMI (non-ST elevated myocardial infarction) (HCC) 07/27/2007   a.) LHC 07/30/2007: 30% mLAD, 99% D1, 50% pLCx, 40% mLCx, 70/75/85% dLCx, 50% OM1, 75/80/80 OM2, 50% pRCA, 60% mRCA, 85% dRCA --> not amenable to PCI   OSA on CPAP    Osteoarthritis    PAD (peripheral artery disease) (HCC)    a. 03/2022 Lower Ext Angio: No signif Ao-iliac dzs. R PT diff dzs and occluded above ankle. R Pedal arch intact w/ excellent antegrade flow provided by R AT-->no indication for revasc.   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   a.) LIMA-LAD, SVG-RCA, seq SVG-OM1-OM2   S/P tube myringotomy    Skin cancer, basal cell    Skin ulcer of right great toe, unspecified ulcer stage (HCC)    T2DM (type 2 diabetes mellitus) (HCC)    Wears glasses    Wears partial dentures    top    Past Surgical History:  Procedure Laterality Date   ABDOMINAL AORTOGRAM W/LOWER EXTREMITY N/A 03/26/2022   Procedure: ABDOMINAL AORTOGRAM W/LOWER EXTREMITY;  Surgeon: Wenona Hamilton, MD;  [facility];  Service: Cardiovascular;  Laterality: N/A;   AMPUTATION TOE Bilateral 06/26/2023  Procedure: AMPUTATION TOE MPJ OF THE JOINT,PARTIAL TOE AMPUTUATION;  Surgeon: Dot Gazella, DPM;  Location: [facility];  Service: Orthopedics/Podiatry;  Laterality: Bilateral;   AMPUTATION TOE Right 12/04/2023   Procedure: AMPUTATION TOE INTERPHALANGEAL 3RD;  Surgeon: Dot Gazella, DPM;  Location: [facility];  Service: Orthopedics/Podiatry;  Laterality: Right;   APPENDECTOMY     CARDIAC CATHETERIZATION N/A 06/11/2016   Procedure: Left Heart Cath and Coronary Angiography;  Surgeon: Michelle Aid, MD;  Location: [facility];  Service: Cardiovascular;  Laterality: N/A;   CARPAL TUNNEL RELEASE Left 04/24/2016   Procedure: CARPAL TUNNEL RELEASE;  Surgeon: Molli Angelucci, MD;  Location: [facility];   Service: Orthopedics;  Laterality: Left;   CATARACT EXTRACTION W/ INTRAOCULAR LENS  IMPLANT, BILATERAL Bilateral    CATARACT EXTRACTION, BILATERAL     R eye 07/16/12, L eye 08/13/12 - with lens implant in both eyes   CORONARY ARTERY BYPASS GRAFT N/A 06/27/2016   Procedure: CORONARY ARTERY BYPASS GRAFTING times four using left internal mammary artery and right leg saphenous vein;  Surgeon: Heriberto London, MD;  Location: [facility];  Service: Open Heart Surgery;  Laterality: N/A;   FOREARM FRACTURE SURGERY Left 1961   KNEE ARTHROPLASTY Left 02/11/2016   Procedure: COMPUTER ASSISTED TOTAL KNEE ARTHROPLASTY;  Surgeon: Arlyne Lame, MD;  Location: [facility];  Service: Orthopedics;  Laterality: Left;   KNEE ARTHROSCOPY Right 2000   LEFT HEART CATH AND CORONARY ANGIOGRAPHY Left 07/30/2007   Procedure: LEFT HEART CATH AND CORONARY ANGIOGRAPHY; Location: [facility]; Surgeon: Thomasene Flemings, MD   LEFT HEART CATH AND CORS/GRAFTS ANGIOGRAPHY Left 01/13/2017   Procedure: Left Heart Cath and Cors/Grafts Angiography;  Surgeon: Michelle Aid, MD;  Location: [facility];  Service: Cardiovascular;  Laterality: Left;   LEFT HEART CATH AND CORS/GRAFTS ANGIOGRAPHY Left 06/09/2023   Procedure: LEFT HEART CATH AND CORS/GRAFTS ANGIOGRAPHY;  Surgeon: Percival Brace, MD;  Location: [facility];  Service: Cardiovascular;  Laterality: Left;   LOWER EXTREMITY ANGIOGRAPHY Left 02/23/2024   Procedure: Lower Extremity Angiography;  Surgeon: Jackquelyn Mass, MD;  Location: [facility];  Service: Cardiovascular;  Laterality: Left;   LOWER EXTREMITY INTERVENTION Left 02/23/2024   Procedure: LOWER EXTREMITY INTERVENTION;  Surgeon: Jackquelyn Mass, MD;  Location: [facility];  Service: Cardiovascular;  Laterality: Left;   METATARSAL HEAD EXCISION Right 03/21/2020   Procedure: METATARSAL HEAD RESECTION RIGHT AND DEBRIDEMENT OF ULCER;  Surgeon: Dot Gazella, DPM;  [facility];  Service:  Podiatry;  Laterality: Right;   METATARSAL HEAD EXCISION Left 09/15/2023   Procedure: METATARSAL HEAD EXCISION;  Surgeon: Dot Gazella, DPM;  Location: [facility];  Service: Orthopedics/Podiatry;  Laterality: Left;   MULTIPLE TOOTH EXTRACTIONS     TEE WITHOUT CARDIOVERSION N/A 06/27/2016   Procedure: TRANSESOPHAGEAL ECHOCARDIOGRAM (TEE);  Surgeon: Heriberto London, MD;  Location: [facility];  Service: Open Heart Surgery;  Laterality: N/A;   TENDON LENGTHENING Left 12/04/2023   Procedure: TENDON-ACHILLES LENGTHENING;  Surgeon: Dot Gazella, DPM;  Location: [facility];  Service: Orthopedics/Podiatry;  Laterality: Left;   TRANSMETATARSAL AMPUTATION Left 12/04/2023   Procedure: TRANSMETATARSAL AMPUTATION;  Surgeon: Dot Gazella, DPM;  Location: [facility];  Service: Orthopedics/Podiatry;  Laterality: Left;    Allergies  Allergen Reactions   Latex Other (See Comments)    Blister and burning (74M insulin  pump latex band aid )   Nitroglycerin  Nausea Only and Other (See Comments)    Patches only -  headache    Statins Other (See Comments)    BODY PAIN, Pt taking Crestor *      Fluvastatin Other (See Comments)    Muscle pain   Fluvastatin Sodium Other (See Comments)    Myalgias   Percocet [Oxycodone -Acetaminophen ] Itching   Rosuvastatin  Other (See Comments)    Muscle pain, Myalgias   Simvastatin Other (See Comments)    Muscle pain, Myalgias   Lipitor [Atorvastatin] Other (See Comments) and Rash    BODY PAIN   Lisinopril  Other (See Comments) and Nausea Only    Light headed, dizzy Other reaction(s): Dizziness   Zetia [Ezetimibe] Other (See Comments)    Muscle Pain    RT foot 12/11/2023 POV #1  LT foot 12/11/2023 POV #1   RT third toe 01/05/2024  LT foot 01/05/2024   RT foot 03/01/2024  LT foot 03/01/2024  Objective/Physical Exam Neurovascular status intact.  Wound noted to the amputation stump of the right third digit with a granular wound base.  Please see above note of photo.  Measured  approximately 1.0 x 1.0 x 0.2 cm.  No exposed bone. The TMA amputation stump left foot appears to be healing nicely.  No obvious dehiscence or sign of infection clinically  Radiographic Exam LT foot 01/05/2024: Unchanged.  Clean osteotomy is noted through metatarsals 1-5.  No gas within the tissues.  Radiographic exam RT foot 01/05/2024: Unchanged.  Assessment: 1. s/p TMA w/ TAL LT. Partial 3rd toe amputation RT. DOS: 12/04/2023 2.  Cellulitis left foot; resolved   Plan of Care:  -Patient was evaluated.   -Medically necessary excisional debridement including subcutaneous tissue was performed today to the right third toe amputation stump.  Excisional debridement of the necrotic nonviable tissue down to healthy bleeding viable tissue was performed with postdebridement measurement same as pre- -Continue Betadine wet-to-dry dressings -Patient completed the antibiotics that were prescribed.  No additional antibiotics prescribed today -Return to clinic 3 weeks    Dot Gazella, DPM [facility]  Dr. Dot Gazella, DPM    [address] [place], [place] [address]                Office [phone]  Fax [phone]

## 2024-03-15 NOTE — Progress Notes (Unsigned)
 MRN : [id number]  Lawrence [name] is a 73 y.o. (1951/08/26) male who presents with chief complaint of check circulation.  History of Present Illness:   The patient returns to the office for followup and review status post angiogram with intervention on 02/23/2024.   Procedure:  Percutaneous transluminal angioplasty and stent placement within [place] 2.5 mm stent left anterior tibial artery              2.    Percutaneous transluminal angioplasty to 3 mm left posterior tibial artery and tibioperoneal trunk  The patient notes improvement in the lower extremity symptoms. No interval shortening of the patient's claudication distance or rest pain symptoms. No new ulcers or wounds have occurred since the last visit.  There have been no significant changes to the patient's overall health care.  No documented history of amaurosis fugax or recent TIA symptoms. There are no recent neurological changes noted. No documented history of DVT, PE or superficial thrombophlebitis. The patient denies recent episodes of angina or shortness of breath.   ABI's Rt=*** and Lt=***  (previous ABI's Rt=*** and Lt=***) Duplex US  of the *** lower extremity arterial system shows ***  No outpatient medications have been marked as taking for the 03/17/24 encounter (Appointment) with Prescilla Brod, Ninette Basque, MD.    Past Medical History:  Diagnosis Date   Allergies    Anginal pain (HCC)    Bilateral carotid artery disease (HCC)    Breast pain, left 08/29/2022   CAD (coronary artery disease) 07/2007   a.) s/p NSTEMI 07/2007 - sig multi-vessel CAD not amenable to PCI; b.) LHC 06/11/2016: multi vessel CAD not amenable to PCI --> refer to CVTS; c.) s/p 4v CABG 06/27/2016; d.) LHC 06/09/2023: 75% mLM, 80% o-pLAD, 60% mLAD, 100% p-mLCx, 100% OM1, 95% pRCA, 100% mRCA, 50% m-dLAD. LIMA-LAD and SVG-OM1 patent. CTO SVG-RCA with L-R collaterals - med mgmt    Carpal tunnel syndrome on left    a.) s/p release 04/2016   CKD (chronic kidney disease), stage III (HCC)    COVID-19 12/09/2022   Diabetic peripheral neuropathy (HCC)    Diastolic dysfunction    a.) TTE 06/25/2016: EF 60-654%. no RWMAs, G1DD, mild AoV annular calc; b.) TTE 03/19/2017: EF 50-55%, no RWMAs, mild MR; c.) TTE 07/22/2019: EF 55-60%, midl LVH, no RWMAs, triv MR, mild TR   Dizziness 04/30/2021   Dyspnea    GERD (gastroesophageal reflux disease)    Glaucoma    Headache    History of bilateral cataract extraction    HLD (hyperlipidemia)    HTN (hypertension)    Long term current use of clopidogrel     Long-term use of aspirin  therapy    MRSA (methicillin resistant staph aureus) culture positive    2006 - 2008   Myocardial infarction (HCC) 04/04/2016   NSTEMI (non-ST elevated myocardial infarction) (HCC) 07/27/2007   a.) LHC 07/30/2007: 30% mLAD, 99% D1, 50% pLCx, 40% mLCx, 70/75/85% dLCx, 50% OM1, 75/80/80 OM2, 50% pRCA, 60% mRCA, 85% dRCA --> not amenable to PCI   OSA on CPAP    Osteoarthritis  PAD (peripheral artery disease) (HCC)    a. 03/2022 Lower Ext Angio: No signif Ao-iliac dzs. R PT diff dzs and occluded above ankle. R Pedal arch intact w/ excellent antegrade flow provided by R AT-->no indication for revasc.   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   a.) LIMA-LAD, SVG-RCA, seq SVG-OM1-OM2   S/P tube myringotomy    Skin cancer, basal cell    Skin ulcer of right great toe, unspecified ulcer stage (HCC)    T2DM (type 2 diabetes mellitus) (HCC)    Wears glasses    Wears partial dentures    top    Past Surgical History:  Procedure Laterality Date   ABDOMINAL AORTOGRAM W/LOWER EXTREMITY N/A 03/26/2022   Procedure: ABDOMINAL AORTOGRAM W/LOWER EXTREMITY;  Surgeon: Wenona Hamilton, MD;  [facility];  Service: Cardiovascular;  Laterality: N/A;   AMPUTATION TOE Bilateral 06/26/2023   Procedure: AMPUTATION TOE MPJ  OF THE JOINT,PARTIAL TOE AMPUTUATION;  Surgeon: Dot Gazella, DPM;  Location: [facility];  Service: Orthopedics/Podiatry;  Laterality: Bilateral;   AMPUTATION TOE Right 12/04/2023   Procedure: AMPUTATION TOE INTERPHALANGEAL 3RD;  Surgeon: Dot Gazella, DPM;  Location: [facility];  Service: Orthopedics/Podiatry;  Laterality: Right;   APPENDECTOMY     CARDIAC CATHETERIZATION N/A 06/11/2016   Procedure: Left Heart Cath and Coronary Angiography;  Surgeon: Michelle Aid, MD;  Location: [facility];  Service: Cardiovascular;  Laterality: N/A;   CARPAL TUNNEL RELEASE Left 04/24/2016   Procedure: CARPAL TUNNEL RELEASE;  Surgeon: Molli Angelucci, MD;  Location: [facility];  Service: Orthopedics;  Laterality: Left;   CATARACT EXTRACTION W/ INTRAOCULAR LENS  IMPLANT, BILATERAL Bilateral    CATARACT EXTRACTION, BILATERAL     R eye 07/16/12, L eye 08/13/12 - with lens implant in both eyes   CORONARY ARTERY BYPASS GRAFT N/A 06/27/2016   Procedure: CORONARY ARTERY BYPASS GRAFTING times four using left internal mammary artery and right leg saphenous vein;  Surgeon: Heriberto London, MD;  Location: [facility] OR;  Service: Open Heart Surgery;  Laterality: N/A;   FOREARM FRACTURE SURGERY Left 1961   KNEE ARTHROPLASTY Left 02/11/2016   Procedure: COMPUTER ASSISTED TOTAL KNEE ARTHROPLASTY;  Surgeon: Arlyne Lame, MD;  Location: [facility];  Service: Orthopedics;  Laterality: Left;   KNEE ARTHROSCOPY Right 2000   LEFT HEART CATH AND CORONARY ANGIOGRAPHY Left 07/30/2007   Procedure: LEFT HEART CATH AND CORONARY ANGIOGRAPHY; Location: [facility]; Surgeon: Thomasene Flemings, MD   LEFT HEART CATH AND CORS/GRAFTS ANGIOGRAPHY Left 01/13/2017   Procedure: Left Heart Cath and Cors/Grafts Angiography;  Surgeon: Michelle Aid, MD;  Location: [facility];  Service: Cardiovascular;  Laterality: Left;   LEFT HEART CATH AND CORS/GRAFTS ANGIOGRAPHY Left 06/09/2023   Procedure: LEFT HEART CATH AND CORS/GRAFTS ANGIOGRAPHY;  Surgeon:  Percival Brace, MD;  Location: [facility];  Service: Cardiovascular;  Laterality: Left;   LOWER EXTREMITY ANGIOGRAPHY Left 02/23/2024   Procedure: Lower Extremity Angiography;  Surgeon: Jackquelyn Mass, MD;  Location: [facility];  Service: Cardiovascular;  Laterality: Left;   LOWER EXTREMITY INTERVENTION Left 02/23/2024   Procedure: LOWER EXTREMITY INTERVENTION;  Surgeon: Jackquelyn Mass, MD;  Location: [facility];  Service: Cardiovascular;  Laterality: Left;   METATARSAL HEAD EXCISION Right 03/21/2020   Procedure: METATARSAL HEAD RESECTION RIGHT AND DEBRIDEMENT OF ULCER;  Surgeon: Dot Gazella, DPM;  [facility];  Service: Podiatry;  Laterality:  Right;   METATARSAL HEAD EXCISION Left 09/15/2023   Procedure: METATARSAL HEAD EXCISION;  Surgeon: Dot Gazella, DPM;  Location: [facility];  Service: Orthopedics/Podiatry;  Laterality: Left;   MULTIPLE TOOTH EXTRACTIONS     TEE WITHOUT CARDIOVERSION N/A 06/27/2016   Procedure: TRANSESOPHAGEAL ECHOCARDIOGRAM (TEE);  Surgeon: Heriberto London, MD;  Location: [facility];  Service: Open Heart Surgery;  Laterality: N/A;   TENDON LENGTHENING Left 12/04/2023   Procedure: TENDON-ACHILLES LENGTHENING;  Surgeon: Dot Gazella, DPM;  Location: [facility];  Service: Orthopedics/Podiatry;  Laterality: Left;   TRANSMETATARSAL AMPUTATION Left 12/04/2023   Procedure: TRANSMETATARSAL AMPUTATION;  Surgeon: Dot Gazella, DPM;  Location: [facility];  Service: Orthopedics/Podiatry;  Laterality: Left;    Social History Social History   Tobacco Use   Smoking status: Former    Current packs/day: 0.00    Average packs/day: 1 pack/day for 16.7 years (16.7 ttl pk-yrs)    Types: Cigarettes    Start date: 11/03/1969    Quit date: 07/22/1986    Years since quitting: 37.6   Smokeless tobacco: Never  Vaping Use   Vaping status: Never Used  Substance Use Topics   Alcohol use: Not Currently   Drug use: No    Family History Family  History  Problem Relation Age of Onset   Hypertension Mother    Lupus Mother    Heart Problems Mother    Hypertension Father    Diabetes Father    Heart attack Father 46       died in his 5s   Drug abuse Brother    Heart disease Brother    Heart attack Maternal Grandmother    Arthritis Maternal Grandmother    Arthritis Maternal Grandfather    Heart attack Paternal Grandmother    Arthritis Paternal Grandmother    Arthritis Paternal Grandfather     Allergies  Allergen Reactions   Latex Other (See Comments)    Blister and burning (68M insulin  pump latex band aid )   Nitroglycerin  Nausea Only and Other (See Comments)    Patches only - headache    Statins Other (See Comments)    BODY PAIN, Pt taking Crestor *      Fluvastatin Other (See Comments)    Muscle pain   Fluvastatin Sodium Other (See Comments)    Myalgias   Percocet [Oxycodone -Acetaminophen ] Itching   Rosuvastatin  Other (See Comments)    Muscle pain, Myalgias   Simvastatin Other (See Comments)    Muscle pain, Myalgias   Lipitor [Atorvastatin] Other (See Comments) and Rash    BODY PAIN   Lisinopril  Other (See Comments) and Nausea Only    Light headed, dizzy Other reaction(s): Dizziness   Zetia [Ezetimibe] Other (See Comments)    Muscle Pain     REVIEW OF SYSTEMS (Negative unless checked)  Constitutional: [] Weight loss  [] Fever  [] Chills Cardiac: [] Chest pain   [] Chest pressure   [] Palpitations   [] Shortness of breath when laying flat   [] Shortness of breath with exertion. Vascular:  [x] Pain in legs with walking   [] Pain in legs at rest  [] History of DVT   [] Phlebitis   [] Swelling in legs   [] Varicose veins   [] Non-healing ulcers Pulmonary:   [] Uses home oxygen    [] Productive cough   [] Hemoptysis   [] Wheeze  [] COPD   [] Asthma Neurologic:  [] Dizziness   [] Seizures   [] History of stroke   [] History of TIA  [] Aphasia   [] Vissual changes   [] Weakness or numbness in arm   []   Weakness or numbness in  leg Musculoskeletal:   [] Joint swelling   [] Joint pain   [] Low back pain Hematologic:  [] Easy bruising  [] Easy bleeding   [] Hypercoagulable state   [] Anemic Gastrointestinal:  [] Diarrhea   [] Vomiting  [] Gastroesophageal reflux/heartburn   [] Difficulty swallowing. Genitourinary:  [] Chronic kidney disease   [] Difficult urination  [] Frequent urination   [] Blood in urine Skin:  [] Rashes   [] Ulcers  Psychological:  [] History of anxiety   []  History of major depression.  Physical Examination  There were no vitals filed for this visit. There is no height or weight on file to calculate BMI. Gen: WD/WN, NAD Head: [place]/AT, No temporalis wasting.  Ear/Nose/Throat: Hearing grossly intact, nares w/o erythema or drainage Eyes: PER, EOMI, sclera nonicteric.  Neck: Supple, no masses.  No bruit or JVD.  Pulmonary:  Good air movement, no audible wheezing, no use of accessory muscles.  Cardiac: RRR, normal S1, S2, no Murmurs. Vascular:  mild trophic changes, no open wounds Vessel Right Left  Radial Palpable Palpable  PT Not Palpable Not Palpable  DP Not Palpable Not Palpable  Gastrointestinal: soft, non-distended. No guarding/no peritoneal signs.  Musculoskeletal: M/S 5/5 throughout.  No visible deformity.  Neurologic: CN 2-12 intact. Pain and light touch intact in extremities.  Symmetrical.  Speech is fluent. Motor exam as listed above. Psychiatric: Judgment intact, Mood & affect appropriate for pt's clinical situation. Dermatologic: No rashes or ulcers noted.  No changes consistent with cellulitis.   CBC Lab Results  Component Value Date   WBC 7.2 09/09/2023   HGB 12.6 (L) 09/09/2023   HCT 36.9 (L) 09/09/2023   MCV 92.9 09/09/2023   PLT 191 09/09/2023    BMET    Component Value Date/Time   NA 141 11/02/2023 1454   NA 139 02/25/2022 1150   NA 137 10/12/2013 0515   K 4.2 11/02/2023 1454   K 3.9 10/12/2013 0515   CL 101 11/02/2023 1454   CL 101 10/12/2013 0515   CO2 30 11/02/2023 1454    CO2 31 10/12/2013 0515   GLUCOSE 115 (H) 11/02/2023 1454   GLUCOSE 218 (H) 10/12/2013 0515   BUN 28 (H) 02/23/2024 1116   BUN 19 02/25/2022 1150   BUN 20 (H) 10/12/2013 0515   CREATININE 1.09 02/23/2024 1116   CREATININE 0.99 10/12/2013 0515   CALCIUM  9.5 11/02/2023 1454   CALCIUM  8.8 10/12/2013 0515   GFRNONAA >60 02/23/2024 1116   GFRNONAA >60 10/12/2013 0515   GFRAA >60 04/23/2017 0937   GFRAA >60 10/12/2013 0515   CrCl cannot be calculated (Patient's most recent lab result is older than the maximum 21 days allowed.).  COAG Lab Results  Component Value Date   INR 1.07 04/23/2017   INR 1.05 03/18/2017   INR 1.29 06/27/2016    Radiology PERIPHERAL VASCULAR CATHETERIZATION Result Date: 02/23/2024 See surgical note for result.    Assessment/Plan There are no diagnoses linked to this encounter.   Devon Fogo, MD  03/15/2024 8:12 PM

## 2024-03-16 ENCOUNTER — Other Ambulatory Visit (INDEPENDENT_AMBULATORY_CARE_PROVIDER_SITE_OTHER): Payer: Self-pay | Admitting: Vascular Surgery

## 2024-03-16 DIAGNOSIS — Z9889 Other specified postprocedural states: Secondary | ICD-10-CM

## 2024-03-17 ENCOUNTER — Ambulatory Visit (INDEPENDENT_AMBULATORY_CARE_PROVIDER_SITE_OTHER): Admitting: Vascular Surgery

## 2024-03-17 ENCOUNTER — Encounter (INDEPENDENT_AMBULATORY_CARE_PROVIDER_SITE_OTHER): Payer: Self-pay | Admitting: Vascular Surgery

## 2024-03-17 ENCOUNTER — Encounter (INDEPENDENT_AMBULATORY_CARE_PROVIDER_SITE_OTHER)

## 2024-03-17 ENCOUNTER — Ambulatory Visit (INDEPENDENT_AMBULATORY_CARE_PROVIDER_SITE_OTHER)

## 2024-03-17 VITALS — BP 165/75 | HR 66 | Resp 18 | Ht 70.0 in | Wt 288.0 lb

## 2024-03-17 DIAGNOSIS — Z9889 Other specified postprocedural states: Secondary | ICD-10-CM | POA: Diagnosis not present

## 2024-03-17 DIAGNOSIS — I7025 Atherosclerosis of native arteries of other extremities with ulceration: Secondary | ICD-10-CM

## 2024-03-17 DIAGNOSIS — I6523 Occlusion and stenosis of bilateral carotid arteries: Secondary | ICD-10-CM | POA: Diagnosis not present

## 2024-03-17 DIAGNOSIS — I739 Peripheral vascular disease, unspecified: Secondary | ICD-10-CM | POA: Diagnosis not present

## 2024-03-17 DIAGNOSIS — I25119 Atherosclerotic heart disease of native coronary artery with unspecified angina pectoris: Secondary | ICD-10-CM

## 2024-03-17 DIAGNOSIS — I10 Essential (primary) hypertension: Secondary | ICD-10-CM | POA: Diagnosis not present

## 2024-03-17 DIAGNOSIS — E1065 Type 1 diabetes mellitus with hyperglycemia: Secondary | ICD-10-CM

## 2024-03-21 LAB — VAS US ABI WITH/WO TBI
Left ABI: 1.29
Right ABI: >1

## 2024-03-22 ENCOUNTER — Ambulatory Visit (INDEPENDENT_AMBULATORY_CARE_PROVIDER_SITE_OTHER): Admitting: Podiatry

## 2024-03-22 ENCOUNTER — Encounter (INDEPENDENT_AMBULATORY_CARE_PROVIDER_SITE_OTHER): Payer: Self-pay

## 2024-03-22 ENCOUNTER — Encounter: Payer: Self-pay | Admitting: Podiatry

## 2024-03-22 DIAGNOSIS — L97512 Non-pressure chronic ulcer of other part of right foot with fat layer exposed: Secondary | ICD-10-CM

## 2024-03-22 DIAGNOSIS — E1351 Other specified diabetes mellitus with diabetic peripheral angiopathy without gangrene: Secondary | ICD-10-CM | POA: Diagnosis not present

## 2024-03-22 MED ORDER — MUPIROCIN 2 % EX OINT: 1.0000 | TOPICAL_OINTMENT | Freq: Two times a day (BID) | CUTANEOUS | 1 refills | Status: DC

## 2024-03-22 NOTE — Progress Notes (Signed)
 Chief Complaint  Patient presents with   Routine Post Op     POV #3, DOS 12/04/23,TRANSMETATARSAL AMPUTATION LT, TENDO ACHILLES LENGTHENING LT, PARTIAL TOE AMPUTATION RT THIRD Wife stated, "The one with no toes is healed.  The toe on the other foot is not healed but I think I almost got it healed."    Subjective:  Patient presents today status post TMA with TAL left.  Partial toe amputation RT third.  DOS: 12/04/2023.  Since last visit the patient has had an episode of cellulitis to the foot.  He was seen by another physician here in our practice on 02/11/2024 and prescribed antibiotics which seem to resolve the cellulitis.  Also had recent vascular procedure done recently.  Past Medical History:  Diagnosis Date   Allergies    Anginal pain (HCC)    Bilateral carotid artery disease (HCC)    Breast pain, left 08/29/2022   CAD (coronary artery disease) 07/2007   a.) s/p NSTEMI 07/2007 - sig multi-vessel CAD not amenable to PCI; b.) LHC 06/11/2016: multi vessel CAD not amenable to PCI --> refer to CVTS; c.) s/p 4v CABG 06/27/2016; d.) LHC 06/09/2023: 75% mLM, 80% o-pLAD, 60% mLAD, 100% p-mLCx, 100% OM1, 95% pRCA, 100% mRCA, 50% m-dLAD. LIMA-LAD and SVG-OM1 patent. CTO SVG-RCA with L-R collaterals - med mgmt   Carpal tunnel syndrome on left    a.) s/p release 04/2016   CKD (chronic kidney disease), stage III (HCC)    COVID-19 12/09/2022   Diabetic peripheral neuropathy (HCC)    Diastolic dysfunction    a.) TTE 06/25/2016: EF 60-654%. no RWMAs, G1DD, mild AoV annular calc; b.) TTE 03/19/2017: EF 50-55%, no RWMAs, mild MR; c.) TTE 07/22/2019: EF 55-60%, midl LVH, no RWMAs, triv MR, mild TR   Dizziness 04/30/2021   Dyspnea    GERD (gastroesophageal reflux disease)    Glaucoma    Headache    History of bilateral cataract extraction    HLD (hyperlipidemia)    HTN (hypertension)    Long term current use of clopidogrel     Long-term use of aspirin  therapy    MRSA (methicillin resistant staph  aureus) culture positive    2006 - 2008   Myocardial infarction (HCC) 04/04/2016   NSTEMI (non-ST elevated myocardial infarction) (HCC) 07/27/2007   a.) LHC 07/30/2007: 30% mLAD, 99% D1, 50% pLCx, 40% mLCx, 70/75/85% dLCx, 50% OM1, 75/80/80 OM2, 50% pRCA, 60% mRCA, 85% dRCA --> not amenable to PCI   OSA on CPAP    Osteoarthritis    PAD (peripheral artery disease) (HCC)    a. 03/2022 Lower Ext Angio: No signif Ao-iliac dzs. R PT diff dzs and occluded above ankle. R Pedal arch intact w/ excellent antegrade flow provided by R AT-->no indication for revasc.   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   a.) LIMA-LAD, SVG-RCA, seq SVG-OM1-OM2   S/P tube myringotomy    Skin cancer, basal cell    Skin ulcer of right great toe, unspecified ulcer stage (HCC)    T2DM (type 2 diabetes mellitus) (HCC)    Wears glasses    Wears partial dentures    top    Past Surgical History:  Procedure Laterality Date   ABDOMINAL AORTOGRAM W/LOWER EXTREMITY N/A 03/26/2022   Procedure: ABDOMINAL AORTOGRAM W/LOWER EXTREMITY;  Surgeon: Wenona Hamilton, MD;  [facility]: [facility];  Service: Cardiovascular;  Laterality: N/A;   AMPUTATION TOE Bilateral 06/26/2023   Procedure: AMPUTATION  TOE MPJ OF THE JOINT,PARTIAL TOE AMPUTUATION;  Surgeon: Dot Gazella, DPM;  Location: [facility];  Service: Orthopedics/Podiatry;  Laterality: Bilateral;   AMPUTATION TOE Right 12/04/2023   Procedure: AMPUTATION TOE INTERPHALANGEAL 3RD;  Surgeon: Dot Gazella, DPM;  Location: [facility];  Service: Orthopedics/Podiatry;  Laterality: Right;   APPENDECTOMY     CARDIAC CATHETERIZATION N/A 06/11/2016   Procedure: Left Heart Cath and Coronary Angiography;  Surgeon: Michelle Aid, MD;  Location: [facility];  Service: Cardiovascular;  Laterality: N/A;   CARPAL TUNNEL RELEASE Left 04/24/2016   Procedure: CARPAL TUNNEL RELEASE;  Surgeon: Molli Angelucci, MD;  Location: [facility];  Service:  Orthopedics;  Laterality: Left;   CATARACT EXTRACTION W/ INTRAOCULAR LENS  IMPLANT, BILATERAL Bilateral    CATARACT EXTRACTION, BILATERAL     R eye 07/16/12, L eye 08/13/12 - with lens implant in both eyes   CORONARY ARTERY BYPASS GRAFT N/A 06/27/2016   Procedure: CORONARY ARTERY BYPASS GRAFTING times four using left internal mammary artery and right leg saphenous vein;  Surgeon: Heriberto London, MD;  Location: [facility] OR;  Service: Open Heart Surgery;  Laterality: N/A;   FOREARM FRACTURE SURGERY Left 1961   KNEE ARTHROPLASTY Left 02/11/2016   Procedure: COMPUTER ASSISTED TOTAL KNEE ARTHROPLASTY;  Surgeon: Arlyne Lame, MD;  Location: [facility];  Service: Orthopedics;  Laterality: Left;   KNEE ARTHROSCOPY Right 2000   LEFT HEART CATH AND CORONARY ANGIOGRAPHY Left 07/30/2007   Procedure: LEFT HEART CATH AND CORONARY ANGIOGRAPHY; Location: [facility]; Surgeon: Thomasene Flemings, MD   LEFT HEART CATH AND CORS/GRAFTS ANGIOGRAPHY Left 01/13/2017   Procedure: Left Heart Cath and Cors/Grafts Angiography;  Surgeon: Michelle Aid, MD;  Location: [facility];  Service: Cardiovascular;  Laterality: Left;   LEFT HEART CATH AND CORS/GRAFTS ANGIOGRAPHY Left 06/09/2023   Procedure: LEFT HEART CATH AND CORS/GRAFTS ANGIOGRAPHY;  Surgeon: Percival Brace, MD;  Location: [facility];  Service: Cardiovascular;  Laterality: Left;   LOWER EXTREMITY ANGIOGRAPHY Left 02/23/2024   Procedure: Lower Extremity Angiography;  Surgeon: Jackquelyn Mass, MD;  Location: [facility];  Service: Cardiovascular;  Laterality: Left;   LOWER EXTREMITY INTERVENTION Left 02/23/2024   Procedure: LOWER EXTREMITY INTERVENTION;  Surgeon: Jackquelyn Mass, MD;  Location: [facility];  Service: Cardiovascular;  Laterality: Left;   METATARSAL HEAD EXCISION Right 03/21/2020   Procedure: METATARSAL HEAD RESECTION RIGHT AND DEBRIDEMENT OF ULCER;  Surgeon: Dot Gazella, DPM;  Location: [facility];  Service: Podiatry;   Laterality: Right;   METATARSAL HEAD EXCISION Left 09/15/2023   Procedure: METATARSAL HEAD EXCISION;  Surgeon: Dot Gazella, DPM;  Location: [facility];  Service: Orthopedics/Podiatry;  Laterality: Left;   MULTIPLE TOOTH EXTRACTIONS     TEE WITHOUT CARDIOVERSION N/A 06/27/2016   Procedure: TRANSESOPHAGEAL ECHOCARDIOGRAM (TEE);  Surgeon: Heriberto London, MD;  Location: [facility] OR;  Service: Open Heart Surgery;  Laterality: N/A;   TENDON LENGTHENING Left 12/04/2023   Procedure: TENDON-ACHILLES LENGTHENING;  Surgeon: Dot Gazella, DPM;  Location: [facility];  Service: Orthopedics/Podiatry;  Laterality: Left;   TRANSMETATARSAL AMPUTATION Left 12/04/2023   Procedure: TRANSMETATARSAL AMPUTATION;  Surgeon: Dot Gazella, DPM;  Location: [facility];  Service: Orthopedics/Podiatry;  Laterality: Left;    Allergies  Allergen Reactions   Latex Other (See Comments)    Blister and burning (30M insulin  pump latex band aid )   Nitroglycerin  Nausea Only and Other (See Comments)    Patches only - headache  Statins Other (See Comments)    BODY PAIN, Pt taking Crestor *      Fluvastatin Other (See Comments)    Muscle pain   Fluvastatin Sodium Other (See Comments)    Myalgias   Percocet [Oxycodone -Acetaminophen ] Itching   Rosuvastatin  Other (See Comments)    Muscle pain, Myalgias   Simvastatin Other (See Comments)    Muscle pain, Myalgias   Lipitor [Atorvastatin] Other (See Comments) and Rash    BODY PAIN   Lisinopril  Other (See Comments) and Nausea Only    Light headed, dizzy Other reaction(s): Dizziness   Zetia [Ezetimibe] Other (See Comments)    Muscle Pain    LT foot 03/01/2024   RT third toe 03/22/2024  Objective/Physical Exam Wound noted to the amputation stump of the right third digit with mixed fibrotic and granular wound base.  Please see above note of photo.  Today it measures approximately 1.3 x 1.3 x 0.2 cm.  After debridement there is no exposed bone.  Unfortunately there is minimal  bleeding with debridement of the wound.    The TMA amputation stump left foot appears stable.  Incision site healed.  Diffuse hyperkeratosis of skin noted.  Vascular: Status post angiogram with intervention on 02/23/2024.  Dr. Devon Fogo [facility] Procedure:  Percutaneous transluminal angioplasty and stent placement within Esprit 2.5 mm stent left anterior tibial artery              2.    Percutaneous transluminal angioplasty to 3 mm left posterior tibial artery and tibioperoneal trunk  Assessment: 1. s/p TMA w/ TAL LT. Partial 3rd toe amputation RT. DOS: 12/04/2023 2.  Ulcer right third toe fat layer exposed 3.  History of PVD bilateral lower extremities   Plan of Care:  -Patient was evaluated.   -Medically necessary excisional debridement including subcutaneous tissue was performed today to the right third toe amputation stump.  Excisional debridement of the necrotic nonviable tissue down to healthier tissue was performed with postdebridement measurement same as pre- -prescription for mupirocin  ointment.  Apply to the amputation stump of the left foot daily as well as the amputation stump of the right third toe with a light dressing -Minimal bleeding upon debridement of the right third toe.  I do believe it would be beneficial for vascular intervention and possible angioplasty to potentially improve circulation to the right lower extremity.  He had very good outcomes to the left lower extremity revascularization recently performed -Once circulation is improved patient may be a good candidate for graft application since the wound has failed standard wound care and failed to improve 50% over the past 4 weeks -Return to clinic 3 weeks  Dot Gazella, DPM [facility]  Dr. Dot Gazella, DPM    [address] [place], [place] [address]                Office [phone]  Fax [phone]

## 2024-03-29 ENCOUNTER — Encounter: Payer: Self-pay | Admitting: Podiatry

## 2024-03-29 ENCOUNTER — Ambulatory Visit (INDEPENDENT_AMBULATORY_CARE_PROVIDER_SITE_OTHER)

## 2024-03-29 ENCOUNTER — Ambulatory Visit (INDEPENDENT_AMBULATORY_CARE_PROVIDER_SITE_OTHER): Admitting: Podiatry

## 2024-03-29 VITALS — Ht 70.0 in | Wt 288.0 lb

## 2024-03-29 DIAGNOSIS — E08621 Diabetes mellitus due to underlying condition with foot ulcer: Secondary | ICD-10-CM

## 2024-03-29 DIAGNOSIS — L97522 Non-pressure chronic ulcer of other part of left foot with fat layer exposed: Secondary | ICD-10-CM

## 2024-03-29 DIAGNOSIS — L97509 Non-pressure chronic ulcer of other part of unspecified foot with unspecified severity: Secondary | ICD-10-CM

## 2024-03-29 DIAGNOSIS — L97512 Non-pressure chronic ulcer of other part of right foot with fat layer exposed: Secondary | ICD-10-CM | POA: Diagnosis not present

## 2024-03-29 DIAGNOSIS — L03116 Cellulitis of left lower limb: Secondary | ICD-10-CM | POA: Diagnosis not present

## 2024-03-29 MED ORDER — DOXYCYCLINE HYCLATE 100 MG PO TABS: 100.0000 mg | ORAL_TABLET | Freq: Two times a day (BID) | ORAL | 0 refills | Status: DC

## 2024-03-29 NOTE — Progress Notes (Signed)
 Chief Complaint  Patient presents with   Wound Check    Pt is here due to spot on the bottom of the left foot, he states he noticed it last Thursday, the spot is small but has been draining clear liquid and is concerned due to other problems going on with his feet.    Subjective:  Patient presents today status post TMA with TAL left.  Partial toe amputation RT third.  DOS: 12/04/2023.  Last in the office 03/22/2024.  Since that time he states the they began to notice a new ulcer development to the plantar aspect of the left foot amputation stump.  There was an episode where he went outside barefoot and is unsure if that is when this was initiated.  They have noticed serous drainage and blood coming from the wound.  Presenting for further treatment and evaluation  Past Medical History:  Diagnosis Date   Allergies    Anginal pain (HCC)    Bilateral carotid artery disease (HCC)    Breast pain, left 08/29/2022   CAD (coronary artery disease) 07/2007   a.) s/p NSTEMI 07/2007 - sig multi-vessel CAD not amenable to PCI; b.) LHC 06/11/2016: multi vessel CAD not amenable to PCI --> refer to CVTS; c.) s/p 4v CABG 06/27/2016; d.) LHC 06/09/2023: 75% mLM, 80% o-pLAD, 60% mLAD, 100% p-mLCx, 100% OM1, 95% pRCA, 100% mRCA, 50% m-dLAD. LIMA-LAD and SVG-OM1 patent. CTO SVG-RCA with L-R collaterals - med mgmt   Carpal tunnel syndrome on left    a.) s/p release 04/2016   CKD (chronic kidney disease), stage III (HCC)    COVID-19 12/09/2022   Diabetic peripheral neuropathy (HCC)    Diastolic dysfunction    a.) TTE 06/25/2016: EF 60-654%. no RWMAs, G1DD, mild AoV annular calc; b.) TTE 03/19/2017: EF 50-55%, no RWMAs, mild MR; c.) TTE 07/22/2019: EF 55-60%, midl LVH, no RWMAs, triv MR, mild TR   Dizziness 04/30/2021   Dyspnea    GERD (gastroesophageal reflux disease)    Glaucoma    Headache    History of bilateral cataract extraction    HLD (hyperlipidemia)    HTN (hypertension)    Long term current use  of clopidogrel     Long-term use of aspirin  therapy    MRSA (methicillin resistant staph aureus) culture positive    2006 - 2008   Myocardial infarction (HCC) 04/04/2016   NSTEMI (non-ST elevated myocardial infarction) (HCC) 07/27/2007   a.) LHC 07/30/2007: 30% mLAD, 99% D1, 50% pLCx, 40% mLCx, 70/75/85% dLCx, 50% OM1, 75/80/80 OM2, 50% pRCA, 60% mRCA, 85% dRCA --> not amenable to PCI   OSA on CPAP    Osteoarthritis    PAD (peripheral artery disease) (HCC)    a. 03/2022 Lower Ext Angio: No signif Ao-iliac dzs. R PT diff dzs and occluded above ankle. R Pedal arch intact w/ excellent antegrade flow provided by R AT-->no indication for revasc.   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   a.) LIMA-LAD, SVG-RCA, seq SVG-OM1-OM2   S/P tube myringotomy    Skin cancer, basal cell    Skin ulcer of right great toe, unspecified ulcer stage (HCC)    T2DM (type 2 diabetes mellitus) (HCC)    Wears glasses    Wears partial dentures    top    Past Surgical History:  Procedure Laterality Date   ABDOMINAL AORTOGRAM W/LOWER EXTREMITY N/A 03/26/2022   Procedure: ABDOMINAL AORTOGRAM W/LOWER EXTREMITY;  Surgeon: Wenona Hamilton,  MD;  [facility]: [facility];  Service: Cardiovascular;  Laterality: N/A;   AMPUTATION TOE Bilateral 06/26/2023   Procedure: AMPUTATION TOE MPJ OF THE JOINT,PARTIAL TOE AMPUTUATION;  Surgeon: Dot Gazella, DPM;  Location: [facility];  Service: Orthopedics/Podiatry;  Laterality: Bilateral;   AMPUTATION TOE Right 12/04/2023   Procedure: AMPUTATION TOE INTERPHALANGEAL 3RD;  Surgeon: Dot Gazella, DPM;  Location: [facility];  Service: Orthopedics/Podiatry;  Laterality: Right;   APPENDECTOMY     CARDIAC CATHETERIZATION N/A 06/11/2016   Procedure: Left Heart Cath and Coronary Angiography;  Surgeon: Michelle Aid, MD;  Location: [facility];  Service: Cardiovascular;  Laterality: N/A;   CARPAL TUNNEL RELEASE Left 04/24/2016   Procedure:  CARPAL TUNNEL RELEASE;  Surgeon: Molli Angelucci, MD;  Location: [facility];  Service: Orthopedics;  Laterality: Left;   CATARACT EXTRACTION W/ INTRAOCULAR LENS  IMPLANT, BILATERAL Bilateral    CATARACT EXTRACTION, BILATERAL     R eye 07/16/12, L eye 08/13/12 - with lens implant in both eyes   CORONARY ARTERY BYPASS GRAFT N/A 06/27/2016   Procedure: CORONARY ARTERY BYPASS GRAFTING times four using left internal mammary artery and right leg saphenous vein;  Surgeon: Heriberto London, MD;  [facility] OR;  Service: Open Heart Surgery;  Laterality: N/A;   FOREARM FRACTURE SURGERY Left 1961   KNEE ARTHROPLASTY Left 02/11/2016   Procedure: COMPUTER ASSISTED TOTAL KNEE ARTHROPLASTY;  Surgeon: Arlyne Lame, MD;  Location: [facility];  Service: Orthopedics;  Laterality: Left;   KNEE ARTHROSCOPY Right 2000   LEFT HEART CATH AND CORONARY ANGIOGRAPHY Left 07/30/2007   Procedure: LEFT HEART CATH AND CORONARY ANGIOGRAPHY; Location: [facility]; Surgeon: Thomasene Flemings, MD   LEFT HEART CATH AND CORS/GRAFTS ANGIOGRAPHY Left 01/13/2017   Procedure: Left Heart Cath and Cors/Grafts Angiography;  Surgeon: Michelle Aid, MD;  Location: [facility];  Service: Cardiovascular;  Laterality: Left;   LEFT HEART CATH AND CORS/GRAFTS ANGIOGRAPHY Left 06/09/2023   Procedure: LEFT HEART CATH AND CORS/GRAFTS ANGIOGRAPHY;  Surgeon: Percival Brace, MD;  Location: [facility];  Service: Cardiovascular;  Laterality: Left;   LOWER EXTREMITY ANGIOGRAPHY Left 02/23/2024   Procedure: Lower Extremity Angiography;  Surgeon: Jackquelyn Mass, MD;  Location: [facility];  Service: Cardiovascular;  Laterality: Left;   LOWER EXTREMITY INTERVENTION Left 02/23/2024   Procedure: LOWER EXTREMITY INTERVENTION;  Surgeon: Jackquelyn Mass, MD;  Location: [facility];  Service: Cardiovascular;  Laterality: Left;   METATARSAL HEAD EXCISION Right 03/21/2020   Procedure: METATARSAL HEAD RESECTION RIGHT AND  DEBRIDEMENT OF ULCER;  Surgeon: Dot Gazella, DPM;  Location: [facility];  Service: Podiatry;  Laterality: Right;   METATARSAL HEAD EXCISION Left 09/15/2023   Procedure: METATARSAL HEAD EXCISION;  Surgeon: Dot Gazella, DPM;  Location: [facility];  Service: Orthopedics/Podiatry;  Laterality: Left;   MULTIPLE TOOTH EXTRACTIONS     TEE WITHOUT CARDIOVERSION N/A 06/27/2016   Procedure: TRANSESOPHAGEAL ECHOCARDIOGRAM (TEE);  Surgeon: Heriberto London, MD;  Location: [facility] OR;  Service: Open Heart Surgery;  Laterality: N/A;   TENDON LENGTHENING Left 12/04/2023   Procedure: TENDON-ACHILLES LENGTHENING;  Surgeon: Dot Gazella, DPM;  Location: [facility];  Service: Orthopedics/Podiatry;  Laterality: Left;   TRANSMETATARSAL AMPUTATION Left 12/04/2023   Procedure: TRANSMETATARSAL AMPUTATION;  Surgeon: Dot Gazella, DPM;  Location: [facility];  Service: Orthopedics/Podiatry;  Laterality: Left;    Allergies  Allergen Reactions   Latex Other (See Comments)    Blister and burning (25M  insulin  pump latex band aid )   Nitroglycerin  Nausea Only and Other (See Comments)    Patches only - headache    Statins Other (See Comments)    BODY PAIN, Pt taking Crestor *      Fluvastatin Other (See Comments)    Muscle pain   Fluvastatin Sodium Other (See Comments)    Myalgias   Percocet [Oxycodone -Acetaminophen ] Itching   Rosuvastatin  Other (See Comments)    Muscle pain, Myalgias   Simvastatin Other (See Comments)    Muscle pain, Myalgias   Lipitor [Atorvastatin] Other (See Comments) and Rash    BODY PAIN   Lisinopril  Other (See Comments) and Nausea Only    Light headed, dizzy Other reaction(s): Dizziness   Zetia [Ezetimibe] Other (See Comments)    Muscle Pain    LT foot 03/01/2024   RT third toe 03/22/2024   LT foot 03/29/2024  Objective/Physical Exam Wound noted to the amputation stump of the right third digit with mixed fibrotic and granular wound base.  Please see above note of photo.  Today it measures  approximately 1.3 x 1.3 x 0.2 cm.  No change since last visit  New onset of a small focal wound to the plantar aspect of the left foot.  Serous drainage noted with expression and debridement of the wound.  No malodor.  No appreciable purulence.  The wound measures approximately 0.3 x 0.3 x 1.5 cm.  It does extend into deeper tissue and probe to bone  Vascular: Status post angiogram with intervention on 02/23/2024.  Dr. Devon Fogo [facility] [facility] Procedure:  Percutaneous transluminal angioplasty and stent placement within Esprit 2.5 mm stent left anterior tibial artery              2.    Percutaneous transluminal angioplasty to 3 mm left posterior tibial artery and tibioperoneal trunk  Radiographic exam LT foot 03/29/2024: Osteotomies across the metatarsals noted.  There is some slight subtle change to the distal osteotomy of the first metatarsal compared to prior x-rays.  Concern for possible osteomyelitis.  Will observe for now  Assessment: 1. s/p TMA w/ TAL LT. Partial 3rd toe amputation RT. DOS: 12/04/2023 2.  Ulcer right third toe fat layer exposed 3.  History of PVD bilateral lower extremities 4.  New onset of ulcer plantar aspect of the left foot   Plan of Care:  -Patient was evaluated.  X-rays reviewed -Medically necessary excisional debridement including subcutaneous tissue was performed today to the right third toe amputation stump.  Excisional debridement of the necrotic nonviable tissue down to healthier tissue was performed with postdebridement measurement same as pre- -recommend Betadine with a dressing daily to the plantar foot wound.  Offloading felt quarter-inch pads were applied to the foot to offload pressure from the wound as well as additional padding provided for the patient -Continue WBAT surgical shoes -Culture taken and sent to pathology for culture and sensitivity of the serous drainage -Prescription for doxycycline  100 mg twice daily x 10 days -Return to clinic 2  weeks   Dot Gazella, DPM [facility]  Dr. Dot Gazella, DPM    [address], [place] [address]                Office [phone]  Fax [phone]

## 2024-03-30 ENCOUNTER — Other Ambulatory Visit: Payer: Self-pay | Admitting: Podiatry

## 2024-03-30 NOTE — Progress Notes (Unsigned)
 MRN : [id number]  Lawrence [name] is a 73 y.o. ([birth year]) male who presents with chief complaint of check circulation.  History of Present Illness:   The patient is seen for evaluation of painful lower extremities and diminished pulses associated with ulceration of the right foot.  The patient notes the ulcer has been present for multiple weeks and has not been improving.  It is very painful and has had some drainage.  No specific history of trauma noted by the patient.  The patient denies fever or chills.  the patient does have diabetes which has been difficult to control.  Patient notes prior to the ulcer developing the extremities were painful particularly with walking.  Patient notes his left leg is done very well status post angiography with intervention.  At the time of angiography of the left lower extremity he was noted to have primarily tibial disease  The patient denies rest pain or dangling of an extremity off the side of the bed during the night for relief. No prior interventions or surgeries.  No history of back problems or DJD of the lumbar sacral spine.   The patient denies amaurosis fugax or recent TIA symptoms. There are no recent neurological changes noted. The patient denies history of DVT, PE or superficial thrombophlebitis. The patient denies recent episodes of angina or shortness of breath.   No outpatient medications have been marked as taking for the 03/31/24 encounter (Appointment) with Prescilla Brod, Ninette Basque, MD.    Past Medical History:  Diagnosis Date   Allergies    Anginal pain (HCC)    Bilateral carotid artery disease (HCC)    Breast pain, left 08/29/2022   CAD (coronary artery disease) 07/2007   a.) s/p NSTEMI 07/2007 - sig multi-vessel CAD not amenable to PCI; b.) LHC 06/11/2016: multi vessel CAD not amenable to PCI --> refer to CVTS; c.) s/p 4v CABG 06/27/2016; d.) LHC 06/09/2023:  75% mLM, 80% o-pLAD, 60% mLAD, 100% p-mLCx, 100% OM1, 95% pRCA, 100% mRCA, 50% m-dLAD. LIMA-LAD and SVG-OM1 patent. CTO SVG-RCA with L-R collaterals - med mgmt   Carpal tunnel syndrome on left    a.) s/p release 04/2016   CKD (chronic kidney disease), stage III (HCC)    COVID-19 12/09/2022   Diabetic peripheral neuropathy (HCC)    Diastolic dysfunction    a.) TTE 06/25/2016: EF 60-654%. no RWMAs, G1DD, mild AoV annular calc; b.) TTE 03/19/2017: EF 50-55%, no RWMAs, mild MR; c.) TTE 07/22/2019: EF 55-60%, midl LVH, no RWMAs, triv MR, mild TR   Dizziness 04/30/2021   Dyspnea    GERD (gastroesophageal reflux disease)    Glaucoma    Headache    History of bilateral cataract extraction    HLD (hyperlipidemia)    HTN (hypertension)    Long term current use of clopidogrel     Long-term use of aspirin  therapy    MRSA (methicillin resistant staph aureus) culture positive    2006 - 2008   Myocardial infarction (HCC) 04/04/2016   NSTEMI (non-ST elevated myocardial infarction) (HCC) 07/27/2007   a.) LHC 07/30/2007: 30% mLAD, 99% D1, 50%  pLCx, 40% mLCx, 70/75/85% dLCx, 50% OM1, 75/80/80 OM2, 50% pRCA, 60% mRCA, 85% dRCA --> not amenable to PCI   OSA on CPAP    Osteoarthritis    PAD (peripheral artery disease) (HCC)    a. 03/2022 Lower Ext Angio: No signif Ao-iliac dzs. R PT diff dzs and occluded above ankle. R Pedal arch intact w/ excellent antegrade flow provided by R AT-->no indication for revasc.   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   a.) LIMA-LAD, SVG-RCA, seq SVG-OM1-OM2   S/P tube myringotomy    Skin cancer, basal cell    Skin ulcer of right great toe, unspecified ulcer stage (HCC)    T2DM (type 2 diabetes mellitus) (HCC)    Wears glasses    Wears partial dentures    top    Past Surgical History:  Procedure Laterality Date   ABDOMINAL AORTOGRAM W/LOWER EXTREMITY N/A 03/26/2022   Procedure: ABDOMINAL AORTOGRAM W/LOWER EXTREMITY;  Surgeon: Wenona Hamilton, MD;  Location: [facility];  Service: Cardiovascular;  Laterality: N/A;   AMPUTATION TOE Bilateral 06/26/2023   Procedure: AMPUTATION TOE MPJ OF THE JOINT,PARTIAL TOE AMPUTUATION;  Surgeon: Dot Gazella, DPM;  Location: [facility];  Service: Orthopedics/Podiatry;  Laterality: Bilateral;   AMPUTATION TOE Right 12/04/2023   Procedure: AMPUTATION TOE INTERPHALANGEAL 3RD;  Surgeon: Dot Gazella, DPM;  Location: [facility];  Service: Orthopedics/Podiatry;  Laterality: Right;   APPENDECTOMY     CARDIAC CATHETERIZATION N/A 06/11/2016   Procedure: Left Heart Cath and Coronary Angiography;  Surgeon: Michelle Aid, MD;  Location: [facility];  Service: Cardiovascular;  Laterality: N/A;   CARPAL TUNNEL RELEASE Left 04/24/2016   Procedure: CARPAL TUNNEL RELEASE;  Surgeon: Molli Angelucci, MD;  Location: [facility];  Service: Orthopedics;  Laterality: Left;   CATARACT EXTRACTION W/ INTRAOCULAR LENS  IMPLANT, BILATERAL Bilateral    CATARACT EXTRACTION, BILATERAL     R eye 07/16/12, L eye 08/13/12 - with lens implant in both eyes   CORONARY ARTERY BYPASS GRAFT N/A 06/27/2016   Procedure: CORONARY ARTERY BYPASS GRAFTING times four using left internal mammary artery and right leg saphenous vein;  Surgeon: Heriberto London, MD;  Location[facility];  Service: Open Heart Surgery;  Laterality: N/A;   FOREARM FRACTURE SURGERY Left 1961   KNEE ARTHROPLASTY Left 02/11/2016   Procedure: COMPUTER ASSISTED TOTAL KNEE ARTHROPLASTY;  Surgeon: Arlyne Lame, MD;  Location: [facility];  Service: Orthopedics;  Laterality: Left;   KNEE ARTHROSCOPY Right 2000   LEFT HEART CATH AND CORONARY ANGIOGRAPHY Left 07/30/2007   Procedure: LEFT HEART CATH AND CORONARY ANGIOGRAPHY; Location: [facility]; Surgeon: Thomasene Flemings, MD   LEFT HEART CATH AND CORS/GRAFTS ANGIOGRAPHY Left 01/13/2017   Procedure: Left Heart Cath and Cors/Grafts Angiography;  Surgeon: Michelle Aid, MD;  Location: [facility];  Service:  Cardiovascular;  Laterality: Left;   LEFT HEART CATH AND CORS/GRAFTS ANGIOGRAPHY Left 06/09/2023   Procedure: LEFT HEART CATH AND CORS/GRAFTS ANGIOGRAPHY;  Surgeon: Percival Brace, MD;  Location: [facility];  Service: Cardiovascular;  Laterality: Left;   LOWER EXTREMITY ANGIOGRAPHY Left 02/23/2024   Procedure: Lower Extremity Angiography;  Surgeon: Jackquelyn Mass, MD;  Location: [facility];  Service: Cardiovascular;  Laterality: Left;   LOWER EXTREMITY INTERVENTION Left 02/23/2024   Procedure: LOWER EXTREMITY INTERVENTION;  Surgeon: Jackquelyn Mass, MD;  Location: [facility];  Service: Cardiovascular;  Laterality: Left;  METATARSAL HEAD EXCISION Right 03/21/2020   Procedure: METATARSAL HEAD RESECTION RIGHT AND DEBRIDEMENT OF ULCER;  Surgeon: Dot Gazella, DPM;  Location: [facility];  Service: Podiatry;  Laterality: Right;   METATARSAL HEAD EXCISION Left 09/15/2023   Procedure: METATARSAL HEAD EXCISION;  Surgeon: Dot Gazella, DPM;  Location: [facility];  Service: Orthopedics/Podiatry;  Laterality: Left;   MULTIPLE TOOTH EXTRACTIONS     TEE WITHOUT CARDIOVERSION N/A 06/27/2016   Procedure: TRANSESOPHAGEAL ECHOCARDIOGRAM (TEE);  Surgeon: Heriberto London, MD;  Location: [facility] OR;  Service: Open Heart Surgery;  Laterality: N/A;   TENDON LENGTHENING Left 12/04/2023   Procedure: TENDON-ACHILLES LENGTHENING;  Surgeon: Dot Gazella, DPM;  Location: [facility];  Service: Orthopedics/Podiatry;  Laterality: Left;   TRANSMETATARSAL AMPUTATION Left 12/04/2023   Procedure: TRANSMETATARSAL AMPUTATION;  Surgeon: Dot Gazella, DPM;  Location: [facility];  Service: Orthopedics/Podiatry;  Laterality: Left;    Social History Social History   Tobacco Use   Smoking status: Former    Current packs/day: 0.00    Average packs/day: 1 pack/day for 16.7 years (16.7 ttl pk-yrs)    Types: Cigarettes    Start date: 11/03/1969    Quit date: 07/22/1986    Years since quitting: 37.7    Smokeless tobacco: Never  Vaping Use   Vaping status: Never Used  Substance Use Topics   Alcohol use: Not Currently   Drug use: No    Family History Family History  Problem Relation Age of Onset   Hypertension Mother    Lupus Mother    Heart Problems Mother    Hypertension Father    Diabetes Father    Heart attack Father 60       died in his 96s   Drug abuse Brother    Heart disease Brother    Heart attack Maternal Grandmother    Arthritis Maternal Grandmother    Arthritis Maternal Grandfather    Heart attack Paternal Grandmother    Arthritis Paternal Grandmother    Arthritis Paternal Grandfather     Allergies  Allergen Reactions   Latex Other (See Comments)    Blister and burning (50M insulin  pump latex band aid )   Nitroglycerin  Nausea Only and Other (See Comments)    Patches only - headache    Statins Other (See Comments)    BODY PAIN, Pt taking Crestor *      Fluvastatin Other (See Comments)    Muscle pain   Fluvastatin Sodium Other (See Comments)    Myalgias   Percocet [Oxycodone -Acetaminophen ] Itching   Rosuvastatin  Other (See Comments)    Muscle pain, Myalgias   Simvastatin Other (See Comments)    Muscle pain, Myalgias   Lipitor [Atorvastatin] Other (See Comments) and Rash    BODY PAIN   Lisinopril  Other (See Comments) and Nausea Only    Light headed, dizzy Other reaction(s): Dizziness   Zetia [Ezetimibe] Other (See Comments)    Muscle Pain     REVIEW OF SYSTEMS (Negative unless checked)  Constitutional: [] Weight loss  [] Fever  [] Chills Cardiac: [] Chest pain   [] Chest pressure   [] Palpitations   [] Shortness of breath when laying flat   [] Shortness of breath with exertion. Vascular:  [x] Pain in legs with walking   [] Pain in legs at rest  [] History of DVT   [] Phlebitis   [] Swelling in legs   [] Varicose veins   [] Non-healing ulcers Pulmonary:   [] Uses home oxygen    [] Productive cough   [] Hemoptysis   [] Wheeze  [] COPD   [] Asthma  Neurologic:   [] Dizziness   [] Seizures   [] History of stroke   [] History of TIA  [] Aphasia   [] Vissual changes   [] Weakness or numbness in arm   [] Weakness or numbness in leg Musculoskeletal:   [] Joint swelling   [] Joint pain   [] Low back pain Hematologic:  [] Easy bruising  [] Easy bleeding   [] Hypercoagulable state   [] Anemic Gastrointestinal:  [] Diarrhea   [] Vomiting  [] Gastroesophageal reflux/heartburn   [] Difficulty swallowing. Genitourinary:  [] Chronic kidney disease   [] Difficult urination  [] Frequent urination   [] Blood in urine Skin:  [] Rashes   [] Ulcers  Psychological:  [] History of anxiety   []  History of major depression.  Physical Examination  There were no vitals filed for this visit. There is no height or weight on file to calculate BMI. Gen: WD/WN, NAD Head: Amber/AT, No temporalis wasting.  Ear/Nose/Throat: Hearing grossly intact, nares w/o erythema or drainage Eyes: PER, EOMI, sclera nonicteric.  Neck: Supple, no masses.  No bruit or JVD.  Pulmonary:  Good air movement, no audible wheezing, no use of accessory muscles.  Cardiac: RRR, normal S1, S2, no Murmurs. Vascular:  mild trophic changes, right foot open wounds Vessel Right Left  Radial Palpable Palpable  PT Not Palpable Not Palpable  DP Not Palpable Not Palpable  Gastrointestinal: soft, non-distended. No guarding/no peritoneal signs.  Musculoskeletal: M/S 5/5 throughout.  No visible deformity.  Neurologic: CN 2-12 intact. Pain and light touch intact in extremities.  Symmetrical.  Speech is fluent. Motor exam as listed above. Psychiatric: Judgment intact, Mood & affect appropriate for pt's clinical situation. Dermatologic: No rashes or ulcers noted.  No changes consistent with cellulitis.   CBC Lab Results  Component Value Date   WBC 7.2 09/09/2023   HGB 12.6 (L) 09/09/2023   HCT 36.9 (L) 09/09/2023   MCV 92.9 09/09/2023   PLT 191 09/09/2023    BMET    Component Value Date/Time   NA 141 11/02/2023 1454   NA 139  02/25/2022 1150   NA 137 10/12/2013 0515   K 4.2 11/02/2023 1454   K 3.9 10/12/2013 0515   CL 101 11/02/2023 1454   CL 101 10/12/2013 0515   CO2 30 11/02/2023 1454   CO2 31 10/12/2013 0515   GLUCOSE 115 (H) 11/02/2023 1454   GLUCOSE 218 (H) 10/12/2013 0515   BUN 28 (H) 02/23/2024 1116   BUN 19 02/25/2022 1150   BUN 20 (H) 10/12/2013 0515   CREATININE 1.09 02/23/2024 1116   CREATININE 0.99 10/12/2013 0515   CALCIUM  9.5 11/02/2023 1454   CALCIUM  8.8 10/12/2013 0515   GFRNONAA >60 02/23/2024 1116   GFRNONAA >60 10/12/2013 0515   GFRAA >60 04/23/2017 0937   GFRAA >60 10/12/2013 0515   CrCl cannot be calculated (Patient's most recent lab result is older than the maximum 21 days allowed.).  COAG Lab Results  Component Value Date   INR 1.07 04/23/2017   INR 1.05 03/18/2017   INR 1.29 06/27/2016    Radiology DG Foot Complete Left Result Date: 03/29/2024 Please see detailed radiograph report in office note.  VAS US  ABI WITH/WO TBI Result Date: 03/21/2024  LOWER EXTREMITY DOPPLER STUDY Patient Name:  Lawrence [name]  Date of Exam:   03/17/2024 Medical Rec #: [id number]        Accession #:    [id number] Date of Birth: January 13, 1951        Patient Gender: M Patient Age:   63 years Exam Location:  [facility] Procedure:  VAS US  ABI WITH/WO TBI Referring Phys: Devon Fogo --------------------------------------------------------------------------------  Indications: Ulceration, and peripheral artery disease. High Risk Factors: Hypertension, hyperlipidemia, coronary artery disease.  Vascular Interventions: Multiple toe amputations                          02/23/2024: PTA and Stent placement withinEsprit 2.5 mm                         stent left ATA. PTA to 3 mm Left Posterior Tibial Artery                         and Tibioperoneal trunk. Comparison Study: 01/26/2024 Performing Technologist: Tonie Franks RVS  Examination Guidelines: A complete evaluation includes at minimum,  Doppler waveform signals and systolic blood pressure reading at the level of bilateral brachial, anterior tibial, and posterior tibial arteries, when vessel segments are accessible. Bilateral testing is considered an integral part of a complete examination. Photoelectric Plethysmograph (PPG) waveforms and toe systolic pressure readings are included as required and additional duplex testing as needed. Limited examinations for reoccurring indications may be performed as noted.  ABI Findings: +---------+------------------+-----+----------+--------------+ Right    Rt Pressure (mmHg)IndexWaveform  Comment        +---------+------------------+-----+----------+--------------+ Brachial 151                                             +---------+------------------+-----+----------+--------------+ ATA      233               1.54 biphasic  [place]             +---------+------------------+-----+----------+--------------+ PTA      235               1.56 monophasicNC             +---------+------------------+-----+----------+--------------+ Great Toe                                 Amputated Toes +---------+------------------+-----+----------+--------------+ +---------+------------------+-----+--------+--------------+ Left     Lt Pressure (mmHg)IndexWaveformComment        +---------+------------------+-----+--------+--------------+ Brachial 146                                           +---------+------------------+-----+--------+--------------+ ATA      195               1.29 biphasic               +---------+------------------+-----+--------+--------------+ PTA      156               1.03 biphasic               +---------+------------------+-----+--------+--------------+ Great Toe                               Amputated Toes +---------+------------------+-----+--------+--------------+ +-------+-----------+--------------+------------+--------------+ ABI/TBIToday's  ABIToday's TBI   Previous ABIPrevious TBI   +-------+-----------+--------------+------------+--------------+ Right  >1.0 [place]    Toes AmputatedNC          Toes Amputated +-------+-----------+--------------+------------+--------------+ Left   1.29  Toes Amputated1.38        Toes Amputated +-------+-----------+--------------+------------+--------------+ Bilateral ABIs appear essentially unchanged compared to prior study on 01/26/2024.  Summary: Right: Resting right ankle-brachial index indicates noncompressible right lower extremity arteries. Left: Resting left ankle-brachial index is within normal range. *See table(s) above for measurements and observations.  Electronically signed by Devon Fogo MD on 03/21/2024 at 8:21:40 AM.    Final      Assessment/Plan 1. Atherosclerosis of native arteries of the extremities with ulceration (HCC) (Primary)  Recommend:  The patient has evidence of severe atherosclerotic changes of both lower extremities associated with ulceration and tissue loss of the right foot.  This represents a limb threatening ischemia and places the patient at the risk for right limb loss.  Patient should undergo angiography of the right lower extremity with the hope for intervention for limb salvage.  The risks and benefits as well as the alternative therapies was discussed in detail with the patient.  All questions were answered.  Patient agrees to proceed with right angiography.  The patient will follow up with me in the office after the procedure.   Z61.096    critical limb ischemia of the lower extremity I70.25      Atherosclerotic occlusive disease with ulceration  CPT codes: 04540   angioplasty tibial peroneal artery 36247   introduction catheter below diaphragm third order  2. Bilateral carotid artery stenosis Recommend:   Given the patient's asymptomatic subcritical stenosis no further invasive testing or surgery at this time.   Duplex ultrasound  shows <30% stenosis bilaterally.   Continue antiplatelet therapy as prescribed Continue management of CAD, HTN and Hyperlipidemia Healthy heart diet,  encouraged exercise at least 4 times per week   Follow up in 24 months with duplex ultrasound and physical exam   3. Coronary artery disease involving native coronary artery of native heart with angina pectoris (HCC) Continue cardiac and antihypertensive medications as already ordered and reviewed, no changes at this time.  Continue statin as ordered and reviewed, no changes at this time  Nitrates PRN for chest pain  4. Benign essential HTN Continue antihypertensive medications as already ordered, these medications have been reviewed and there are no changes at this time.  5. Type 1 diabetes mellitus with hyperglycemia (HCC) Continue hypoglycemic medications as already ordered, these medications have been reviewed and there are no changes at this time.  Hgb A1C to be monitored as already arranged by primary service    Devon Fogo, MD  03/30/2024 8:25 AM

## 2024-03-30 NOTE — Addendum Note (Signed)
 Addended by: Geroldine Kotyk on: 03/30/2024 12:01 PM   Modules accepted: Orders

## 2024-03-31 ENCOUNTER — Ambulatory Visit (INDEPENDENT_AMBULATORY_CARE_PROVIDER_SITE_OTHER): Admitting: Vascular Surgery

## 2024-03-31 ENCOUNTER — Encounter (INDEPENDENT_AMBULATORY_CARE_PROVIDER_SITE_OTHER): Payer: Self-pay | Admitting: Vascular Surgery

## 2024-03-31 VITALS — BP 131/73 | HR 62 | Resp 18 | Wt 292.0 lb

## 2024-03-31 DIAGNOSIS — I10 Essential (primary) hypertension: Secondary | ICD-10-CM | POA: Diagnosis not present

## 2024-03-31 DIAGNOSIS — I7025 Atherosclerosis of native arteries of other extremities with ulceration: Secondary | ICD-10-CM

## 2024-03-31 DIAGNOSIS — I6523 Occlusion and stenosis of bilateral carotid arteries: Secondary | ICD-10-CM | POA: Diagnosis not present

## 2024-03-31 DIAGNOSIS — E1065 Type 1 diabetes mellitus with hyperglycemia: Secondary | ICD-10-CM

## 2024-03-31 DIAGNOSIS — I25119 Atherosclerotic heart disease of native coronary artery with unspecified angina pectoris: Secondary | ICD-10-CM

## 2024-04-01 ENCOUNTER — Encounter (INDEPENDENT_AMBULATORY_CARE_PROVIDER_SITE_OTHER): Payer: Self-pay | Admitting: Vascular Surgery

## 2024-04-03 LAB — WOUND CULTURE
MICRO NUMBER:: 16510765
SPECIMEN QUALITY:: ADEQUATE

## 2024-04-03 LAB — HOUSE ACCOUNT TRACKING

## 2024-04-11 ENCOUNTER — Telehealth (INDEPENDENT_AMBULATORY_CARE_PROVIDER_SITE_OTHER): Payer: Self-pay

## 2024-04-11 NOTE — Telephone Encounter (Signed)
 I attempted to contact the patient to schedule him for a right leg angio with Dr. Prescilla Brod. A message was left for a return call.

## 2024-04-11 NOTE — Telephone Encounter (Signed)
 Patient called back and is now scheduled with Dr. Prescilla Brod on 05/03/24 with a 10:00 am arrival time to the [facility] for a right leg angio with intervention. Pre-procedure instructions were discussed and will be sent to Mychart (patient states he doesn't use) and mailed.

## 2024-04-12 ENCOUNTER — Encounter: Payer: Self-pay | Admitting: Podiatry

## 2024-04-12 ENCOUNTER — Ambulatory Visit (INDEPENDENT_AMBULATORY_CARE_PROVIDER_SITE_OTHER): Admitting: Podiatry

## 2024-04-12 VITALS — Ht 70.0 in | Wt 292.0 lb

## 2024-04-12 DIAGNOSIS — L97509 Non-pressure chronic ulcer of other part of unspecified foot with unspecified severity: Secondary | ICD-10-CM | POA: Diagnosis not present

## 2024-04-12 DIAGNOSIS — E08621 Diabetes mellitus due to underlying condition with foot ulcer: Secondary | ICD-10-CM

## 2024-04-14 ENCOUNTER — Encounter: Payer: Self-pay | Admitting: Primary Care

## 2024-04-14 ENCOUNTER — Ambulatory Visit (INDEPENDENT_AMBULATORY_CARE_PROVIDER_SITE_OTHER): Admitting: Primary Care

## 2024-04-14 VITALS — BP 102/60 | HR 64 | Temp 97.2°F | Ht 70.0 in | Wt 294.0 lb

## 2024-04-14 DIAGNOSIS — E1065 Type 1 diabetes mellitus with hyperglycemia: Secondary | ICD-10-CM | POA: Diagnosis not present

## 2024-04-14 NOTE — Patient Instructions (Signed)
 You will either be contacted via phone regarding your referral to endocrinology, or you may receive a letter on your MyChart portal from our referral team with instructions for scheduling an appointment. Please let us  know if you have not been contacted by anyone within two weeks.  It was a pleasure to see you today!

## 2024-04-14 NOTE — Assessment & Plan Note (Signed)
 Referral placed for local endocrinologist for convenience.  Agree to provide prescription for non invasive glucose meter. He declines CGM reader and sensors.

## 2024-04-14 NOTE — Progress Notes (Signed)
 Subjective:    Patient ID: Lawrence [name], male    DOB: January 05, 1951, 73 y.o.   MRN: [id number]  HPI  Lawrence [name] is a very pleasant 73 y.o. male with a history of type 1 diabetes, OSA, CKD, morbid obesity, CAD with CABG, MI, hypertension, chronic foot ulcer who presents today requesting a prescription for glucometer.  He is requesting the non invasive glucose reader to monitor glucose levels. He has type 1 diabetes and needs to check glucose frequently. The CGM has been difficult to set up, he has an outdated phone for which does not work.   He presented to the pharmacy for the non invasive glucose reading and was told that he needed a prescription. Follows with endocrinology for diabetes through the [facility], but the drive and traffic has become too cumbersome. He would like to see a local endocrinologist.    Review of Systems  Respiratory:  Negative for shortness of breath.   Cardiovascular:  Negative for chest pain.  Neurological:  Positive for numbness.         Past Medical History:  Diagnosis Date   Allergies    Anginal pain (HCC)    Bilateral carotid artery disease (HCC)    Breast pain, left 08/29/2022   CAD (coronary artery disease) 07/2007   a.) s/p NSTEMI 07/2007 - sig multi-vessel CAD not amenable to PCI; b.) LHC 06/11/2016: multi vessel CAD not amenable to PCI --> refer to CVTS; c.) s/p 4v CABG 06/27/2016; d.) LHC 06/09/2023: 75% mLM, 80% o-pLAD, 60% mLAD, 100% p-mLCx, 100% OM1, 95% pRCA, 100% mRCA, 50% m-dLAD. LIMA-LAD and SVG-OM1 patent. CTO SVG-RCA with L-R collaterals - med mgmt   Carpal tunnel syndrome on left    a.) s/p release 04/2016   CKD (chronic kidney disease), stage III (HCC)    COVID-19 12/09/2022   Diabetic peripheral neuropathy (HCC)    Diastolic dysfunction    a.) TTE 06/25/2016: EF 60-654%. no RWMAs, G1DD, mild AoV annular calc; b.) TTE 03/19/2017: EF 50-55%, no RWMAs, mild MR; c.) TTE 07/22/2019: EF 55-60%, midl LVH, no RWMAs, triv MR, mild TR    Dizziness 04/30/2021   Dyspnea    GERD (gastroesophageal reflux disease)    Glaucoma    Headache    History of bilateral cataract extraction    HLD (hyperlipidemia)    HTN (hypertension)    Long term current use of clopidogrel     Long-term use of aspirin  therapy    MRSA (methicillin resistant staph aureus) culture positive    2006 - 2008   Myocardial infarction (HCC) 04/04/2016   NSTEMI (non-ST elevated myocardial infarction) (HCC) 07/27/2007   a.) LHC 07/30/2007: 30% mLAD, 99% D1, 50% pLCx, 40% mLCx, 70/75/85% dLCx, 50% OM1, 75/80/80 OM2, 50% pRCA, 60% mRCA, 85% dRCA --> not amenable to PCI   OSA on CPAP    Osteoarthritis    PAD (peripheral artery disease) (HCC)    a. 03/2022 Lower Ext Angio: No signif Ao-iliac dzs. R PT diff dzs and occluded above ankle. R Pedal arch intact w/ excellent antegrade flow provided by R AT-->no indication for revasc.   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   a.) LIMA-LAD, SVG-RCA, seq SVG-OM1-OM2   S/P tube myringotomy    Skin cancer, basal cell    Skin ulcer of right great toe, unspecified ulcer stage (HCC)    T2DM (type 2 diabetes mellitus) (HCC)    Wears glasses  Wears partial dentures    top    Social History   Socioeconomic History   Marital status: Married    Spouse name: Kouba,Lajuana   Number of children: Not on file   Years of education: Not on file   Highest education level: Not on file  Occupational History   Not on file  Tobacco Use   Smoking status: Former    Current packs/day: 0.00    Average packs/day: 1 pack/day for 16.7 years (16.7 ttl pk-yrs)    Types: Cigarettes    Start date: 11/03/1969    Quit date: 07/22/1986    Years since quitting: 37.7   Smokeless tobacco: Never  Vaping Use   Vaping status: Never Used  Substance and Sexual Activity   Alcohol use: Not Currently   Drug use: No   Sexual activity: Never  Other Topics Concern   Not on file  Social History Narrative    Married.  Lives at home with wife.    Social Drivers of Corporate investment banker Strain: Low Risk  (02/18/2024)   Received from [facility]   Overall Financial Resource Strain (CARDIA)    Difficulty of Paying Living Expenses: Not hard at all  Food Insecurity: No Food Insecurity (02/18/2024)   Received from [facility]   Hunger Vital Sign    Worried About Running Out of Food in the Last Year: Never true    Ran Out of Food in the Last Year: Never true  Transportation Needs: No Transportation Needs (02/18/2024)   Received from [facility] - Transportation    In the past 12 months, has lack of transportation kept you from medical appointments or from getting medications?: No    Lack of Transportation (Non-Medical): No  Physical Activity: Inactive (10/20/2023)   Exercise Vital Sign    Days of Exercise per Week: 0 days    Minutes of Exercise per Session: 0 min  Stress: No Stress Concern Present (10/20/2023)   [employer] of Occupational Health - Occupational Stress Questionnaire    Feeling of Stress : Only a little  Social Connections: Moderately Integrated (10/20/2023)   Social Connection and Isolation Panel    Frequency of Communication with Friends and Family: More than three times a week    Frequency of Social Gatherings with Friends and Family: Twice a week    Attends Religious Services: 1 to 4 times per year    Active Member of [employer] or Organizations: No    Attends Banker Meetings: Never    Marital Status: Married  Catering manager Violence: Not At Risk (10/20/2023)   Humiliation, Afraid, Rape, and Kick questionnaire    Fear of Current or Ex-Partner: No    Emotionally Abused: No    Physically Abused: No    Sexually Abused: No    Past Surgical History:  Procedure Laterality Date   ABDOMINAL AORTOGRAM W/LOWER EXTREMITY N/A 03/26/2022   Procedure: ABDOMINAL AORTOGRAM W/LOWER EXTREMITY;   Surgeon: Wenona Hamilton, MD;  Location: [facility];  Service: Cardiovascular;  Laterality: N/A;   AMPUTATION TOE Bilateral 06/26/2023   Procedure: AMPUTATION TOE MPJ OF THE JOINT,PARTIAL TOE AMPUTUATION;  Surgeon: Dot Gazella, DPM;  Location: [facility] OR[facility];  Service: Orthopedics/Podiatry;  Laterality: Bilateral;   AMPUTATION TOE Right 12/04/2023   Procedure: AMPUTATION TOE INTERPHALANGEAL 3RD;  Surgeon: Dot Gazella, DPM;  Location: [facility];  Service: Orthopedics/Podiatry;  Laterality: Right;   APPENDECTOMY  CARDIAC CATHETERIZATION N/A 06/11/2016   Procedure: Left Heart Cath and Coronary Angiography;  Surgeon: Michelle Aid, MD;  Location: [facility];  Service: Cardiovascular;  Laterality: N/A;   CARPAL TUNNEL RELEASE Left 04/24/2016   Procedure: CARPAL TUNNEL RELEASE;  Surgeon: Molli Angelucci, MD;  Location: [facility];  Service: Orthopedics;  Laterality: Left;   CATARACT EXTRACTION W/ INTRAOCULAR LENS  IMPLANT, BILATERAL Bilateral    CATARACT EXTRACTION, BILATERAL     R eye 07/16/12, L eye 08/13/12 - with lens implant in both eyes   CORONARY ARTERY BYPASS GRAFT N/A 06/27/2016   Procedure: CORONARY ARTERY BYPASS GRAFTING times four using left internal mammary artery and right leg saphenous vein;  Surgeon: Heriberto London, MD;  Location: [facility] OR;  Service: Open Heart Surgery;  Laterality: N/A;   FOREARM FRACTURE SURGERY Left 1961   KNEE ARTHROPLASTY Left 02/11/2016   Procedure: COMPUTER ASSISTED TOTAL KNEE ARTHROPLASTY;  Surgeon: Arlyne Lame, MD;  Location: [facility];  Service: Orthopedics;  Laterality: Left;   KNEE ARTHROSCOPY Right 2000   LEFT HEART CATH AND CORONARY ANGIOGRAPHY Left 07/30/2007   Procedure: LEFT HEART CATH AND CORONARY ANGIOGRAPHY; Location: [facility]; Surgeon: Thomasene Flemings, MD   LEFT HEART CATH AND CORS/GRAFTS ANGIOGRAPHY Left 01/13/2017   Procedure: Left Heart Cath and Cors/Grafts Angiography;  Surgeon: Michelle Aid, MD;  Location: [facility];  Service: Cardiovascular;  Laterality: Left;   LEFT HEART CATH AND CORS/GRAFTS ANGIOGRAPHY Left 06/09/2023   Procedure: LEFT HEART CATH AND CORS/GRAFTS ANGIOGRAPHY;  Surgeon: Percival Brace, MD;  Location: [facility];  Service: Cardiovascular;  Laterality: Left;   LOWER EXTREMITY ANGIOGRAPHY Left 02/23/2024   Procedure: Lower Extremity Angiography;  Surgeon: Jackquelyn Mass, MD;  Location: [facility];  Service: Cardiovascular;  Laterality: Left;   LOWER EXTREMITY INTERVENTION Left 02/23/2024   Procedure: LOWER EXTREMITY INTERVENTION;  Surgeon: Jackquelyn Mass, MD;  Location: [facility];  Service: Cardiovascular;  Laterality: Left;   METATARSAL HEAD EXCISION Right 03/21/2020   Procedure: METATARSAL HEAD RESECTION RIGHT AND DEBRIDEMENT OF ULCER;  Surgeon: Dot Gazella, DPM;  Location: [facility];  Service: Podiatry;  Laterality: Right;   METATARSAL HEAD EXCISION Left 09/15/2023   Procedure: METATARSAL HEAD EXCISION;  Surgeon: Dot Gazella, DPM;  Location: [facility];  Service: Orthopedics/Podiatry;  Laterality: Left;   MULTIPLE TOOTH EXTRACTIONS     TEE WITHOUT CARDIOVERSION N/A 06/27/2016   Procedure: TRANSESOPHAGEAL ECHOCARDIOGRAM (TEE);  Surgeon: Heriberto London, MD;  Location[facility] OR;  Service: Open Heart Surgery;  Laterality: N/A;   TENDON LENGTHENING Left 12/04/2023   Procedure: TENDON-ACHILLES LENGTHENING;  Surgeon: Dot Gazella, DPM;  Location: [facility];  Service: Orthopedics/Podiatry;  Laterality: Left;   TRANSMETATARSAL AMPUTATION Left 12/04/2023   Procedure: TRANSMETATARSAL AMPUTATION;  Surgeon: Dot Gazella, DPM;  Location: [facility];  Service: Orthopedics/Podiatry;  Laterality: Left;    Family History  Problem Relation Age of Onset   Hypertension Mother    Lupus Mother    Heart Problems Mother    Hypertension Father    Diabetes Father    Heart attack Father 75       died in his 39s   Drug abuse Brother    Heart disease Brother     Heart attack Maternal Grandmother    Arthritis Maternal Grandmother    Arthritis Maternal Grandfather    Heart attack Paternal Grandmother    Arthritis Paternal Grandmother    Arthritis Paternal Actor  Allergies  Allergen Reactions   Latex Other (See Comments)    Blister and burning (19M insulin  pump latex band aid )   Nitroglycerin  Nausea Only and Other (See Comments)    Patches only - headache    Statins Other (See Comments)    BODY PAIN, Pt taking Crestor *      Fluvastatin Other (See Comments)    Muscle pain   Fluvastatin Sodium Other (See Comments)    Myalgias   Percocet [Oxycodone -Acetaminophen ] Itching   Rosuvastatin  Other (See Comments)    Muscle pain, Myalgias   Simvastatin Other (See Comments)    Muscle pain, Myalgias   Lipitor [Atorvastatin] Other (See Comments) and Rash    BODY PAIN   Lisinopril  Other (See Comments) and Nausea Only    Light headed, dizzy Other reaction(s): Dizziness   Zetia [Ezetimibe] Other (See Comments)    Muscle Pain    Current Outpatient Medications on File Prior to Visit  Medication Sig Dispense Refill   acetaminophen  (TYLENOL ) 500 MG tablet Take 1,000 mg by mouth every 6 (six) hours as needed for moderate pain (pain score 4-6).     AIMOVIG 140 MG/ML SOAJ Inject 140 mg into the skin every 30 (thirty) days.     albuterol  (VENTOLIN  HFA) 108 (90 Base) MCG/ACT inhaler Inhale 2 puffs into the lungs every 4 (four) hours as needed for wheezing. 1 each 0   aspirin  81 MG EC tablet Take 81 mg by mouth daily. Swallow whole.     Calcium  Carb-Cholecalciferol  (CALCIUM  + D3 PO) Take 1 tablet by mouth in the morning and at bedtime.     carvedilol (COREG) 12.5 MG tablet Take 12.5 mg by mouth 2 (two) times daily with a meal.     Cholecalciferol  (VITAMIN D ) 50 MCG (2000 UT) tablet Take 2,000 Units by mouth in the morning and at bedtime.     clopidogrel  (PLAVIX ) 75 MG tablet Take 1 tablet (75 mg total) by mouth daily. 30 tablet 0   famotidine   (ACID CONTROLLER) 10 MG tablet Take 10 mg by mouth in the morning.     fluticasone (FLONASE) 50 MCG/ACT nasal spray Place 1 spray into both nostrils daily as needed for allergies.     furosemide  (LASIX ) 40 MG tablet Take 1 tablet (40 mg total) by mouth daily. 30 tablet 1   glucagon 1 MG injection Inject 1 mg into the skin once as needed.     GLUTOSE 15 40 % GEL Take 1 Tube by mouth once as needed for low blood sugar.     insulin  aspart (NOVOLOG ) 100 UNIT/ML injection Inject 20 Units into the skin 3 (three) times daily. (Patient taking differently: Inject 10-20 Units into the skin 3 (three) times daily with meals. Sliding Scale will go up to 25 units for BG > 300 - 400) 10 mL 5   insulin  glargine-yfgn (SEMGLEE ) 100 UNIT/ML injection Inject 20 Units into the skin at bedtime.     isosorbide  mononitrate (IMDUR ) 60 MG 24 hr tablet Take 60 mg by mouth in the morning and at bedtime.     loratadine  (CLARITIN ) 10 MG tablet Take 10 mg by mouth in the morning.     nitroGLYCERIN  (NITROSTAT ) 0.4 MG SL tablet DISSOLVE 1 TABLET UNDER THE TONGUE EVERY 5 MINUTES AS NEEDED FOR CHEST PAIN 30 tablet 0   potassium chloride  SA (K-DUR,KLOR-CON ) 20 MEQ tablet Take 1 tablet (20 mEq total) by mouth daily. 30 tablet 1   rosuvastatin  (CRESTOR ) 20 MG tablet Take 1  tablet (20 mg total) by mouth daily at 6 PM. 30 tablet 1   Travoprost, BAK Free, (TRAVATAN) 0.004 % SOLN ophthalmic solution Place 1 drop into both eyes at bedtime.      vitamin C  (ASCORBIC ACID ) 500 MG tablet Take 500 mg by mouth 2 (two) times daily.      amoxicillin  (AMOXIL ) 500 MG tablet Take 2,000 mg by mouth See admin instructions. Take 4 capsules (2000 mg) by mouth 1 hour prior to dental appointments (Patient not taking: Reported on 04/14/2024)     doxycycline  (VIBRA -TABS) 100 MG tablet Take 1 tablet (100 mg total) by mouth 2 (two) times daily. (Patient not taking: Reported on 04/14/2024) 20 tablet 0   doxycycline  (VIBRA -TABS) 100 MG tablet Take 1 tablet (100 mg  total) by mouth 2 (two) times daily. (Patient not taking: Reported on 04/14/2024) 60 tablet 0   doxycycline  (VIBRA -TABS) 100 MG tablet Take 1 tablet (100 mg total) by mouth 2 (two) times daily. (Patient not taking: Reported on 04/14/2024) 20 tablet 0   mupirocin  ointment (BACTROBAN ) 2 % Apply 1 Application topically 2 (two) times daily. (Patient not taking: Reported on 04/14/2024) 30 g 1   oxyCODONE -acetaminophen  (PERCOCET) 5-325 MG tablet Take 1 tablet by mouth every 4 (four) hours as needed for severe pain (pain score 7-10). (Patient not taking: Reported on 04/14/2024) 20 tablet 0   No current facility-administered medications on file prior to visit.    BP 102/60   Pulse 64   Temp (!) 97.2 F (36.2 C) (Temporal)   Ht 5' 10 (1.778 m)   Wt 294 lb (133.4 kg)   SpO2 97%   BMI 42.18 kg/m  Objective:   Physical Exam  Cardiovascular:     Rate and Rhythm: Normal rate and regular rhythm.  Pulmonary:     Effort: Pulmonary effort is normal.     Breath sounds: Normal breath sounds.   Musculoskeletal:     Cervical back: Neck supple.   Skin:    General: Skin is warm and dry.   Neurological:     Mental Status: He is alert and oriented to person, place, and time.   Psychiatric:        Mood and Affect: Mood normal.           Assessment & Plan:  Type 1 diabetes mellitus with hyperglycemia [facility]) Assessment & Plan: Referral placed for local endocrinologist for convenience.  Agree to provide prescription for non invasive glucose meter. He declines CGM reader and sensors.   Orders: -     Ambulatory referral to Endocrinology        Gabriel John, NP

## 2024-04-15 ENCOUNTER — Other Ambulatory Visit

## 2024-04-22 ENCOUNTER — Ambulatory Visit

## 2024-04-25 NOTE — Progress Notes (Signed)
 Chief Complaint  Patient presents with   Wound Check    Pt is here due to wound care on right foot due to ulcer.    Subjective:  Patient presents today status post TMA with TAL left.  Partial toe amputation RT third.  DOS: 12/04/2023.  Follow-up evaluation of ulcer to the distal tip of the right third toe partial amputation site.  The patient has now had the wound for over 4 weeks with less than 50% wound closure.  Past Medical History:  Diagnosis Date   Allergies    Anginal pain (HCC)    Bilateral carotid artery disease (HCC)    Breast pain, left 08/29/2022   CAD (coronary artery disease) 07/2007   a.) s/p NSTEMI 07/2007 - sig multi-vessel CAD not amenable to PCI; b.) LHC 06/11/2016: multi vessel CAD not amenable to PCI --> refer to CVTS; c.) s/p 4v CABG 06/27/2016; d.) LHC 06/09/2023: 75% mLM, 80% o-pLAD, 60% mLAD, 100% p-mLCx, 100% OM1, 95% pRCA, 100% mRCA, 50% m-dLAD. LIMA-LAD and SVG-OM1 patent. CTO SVG-RCA with L-R collaterals - med mgmt   Carpal tunnel syndrome on left    a.) s/p release 04/2016   CKD (chronic kidney disease), stage III (HCC)    COVID-19 12/09/2022   Diabetic peripheral neuropathy (HCC)    Diastolic dysfunction    a.) TTE 06/25/2016: EF 60-654%. no RWMAs, G1DD, mild AoV annular calc; b.) TTE 03/19/2017: EF 50-55%, no RWMAs, mild MR; c.) TTE 07/22/2019: EF 55-60%, midl LVH, no RWMAs, triv MR, mild TR   Dizziness 04/30/2021   Dyspnea    GERD (gastroesophageal reflux disease)    Glaucoma    Headache    History of bilateral cataract extraction    HLD (hyperlipidemia)    HTN (hypertension)    Long term current use of clopidogrel     Long-term use of aspirin  therapy    MRSA (methicillin resistant staph aureus) culture positive    2006 - 2008   Myocardial infarction (HCC) 04/04/2016   NSTEMI (non-ST elevated myocardial infarction) (HCC) 07/27/2007   a.) LHC 07/30/2007: 30% mLAD, 99% D1, 50% pLCx, 40% mLCx, 70/75/85% dLCx, 50% OM1, 75/80/80 OM2, 50% pRCA, 60%  mRCA, 85% dRCA --> not amenable to PCI   OSA on CPAP    Osteoarthritis    PAD (peripheral artery disease) (HCC)    a. 03/2022 Lower Ext Angio: No signif Ao-iliac dzs. R PT diff dzs and occluded above ankle. R Pedal arch intact w/ excellent antegrade flow provided by R AT-->no indication for revasc.   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   a.) LIMA-LAD, SVG-RCA, seq SVG-OM1-OM2   S/P tube myringotomy    Skin cancer, basal cell    Skin ulcer of right great toe, unspecified ulcer stage (HCC)    T2DM (type 2 diabetes mellitus) (HCC)    Wears glasses    Wears partial dentures    top    Past Surgical History:  Procedure Laterality Date   ABDOMINAL AORTOGRAM W/LOWER EXTREMITY N/A 03/26/2022   Procedure: ABDOMINAL AORTOGRAM W/LOWER EXTREMITY;  Surgeon: Darron Deatrice LABOR, MD;  [facility]: [facility];  Service: Cardiovascular;  Laterality: N/A;   AMPUTATION TOE Bilateral 06/26/2023   Procedure: AMPUTATION TOE MPJ OF THE JOINT,PARTIAL TOE AMPUTUATION;  Surgeon: Janit Thresa HERO, DPM;  Location: [facility];  Service: Orthopedics/Podiatry;  Laterality: Bilateral;   AMPUTATION TOE Right 12/04/2023   Procedure: AMPUTATION TOE INTERPHALANGEAL 3RD;  Surgeon: Janit Thresa HERO,  DPM;  Location: [facility];  Service: Orthopedics/Podiatry;  Laterality: Right;   APPENDECTOMY     CARDIAC CATHETERIZATION N/A 06/11/2016   Procedure: Left Heart Cath and Coronary Angiography;  Surgeon: Wolm JINNY Rhyme, MD;  Location: [facility];  Service: Cardiovascular;  Laterality: N/A;   CARPAL TUNNEL RELEASE Left 04/24/2016   Procedure: CARPAL TUNNEL RELEASE;  Surgeon: Ozell Flake, MD;  Location: [facility];  Service: Orthopedics;  Laterality: Left;   CATARACT EXTRACTION W/ INTRAOCULAR LENS  IMPLANT, BILATERAL Bilateral    CATARACT EXTRACTION, BILATERAL     R eye 07/16/12, L eye 08/13/12 - with lens implant in both eyes   CORONARY ARTERY BYPASS GRAFT N/A 06/27/2016   Procedure:  CORONARY ARTERY BYPASS GRAFTING times four using left internal mammary artery and right leg saphenous vein;  Surgeon: Maude Fleeta Ochoa, MD;  Location: [facility];  Service: Open Heart Surgery;  Laterality: N/A;   FOREARM FRACTURE SURGERY Left 1961   KNEE ARTHROPLASTY Left 02/11/2016   Procedure: COMPUTER ASSISTED TOTAL KNEE ARTHROPLASTY;  Surgeon: Lynwood SHAUNNA Hue, MD;  Location: [facility];  Service: Orthopedics;  Laterality: Left;   KNEE ARTHROSCOPY Right 2000   LEFT HEART CATH AND CORONARY ANGIOGRAPHY Left 07/30/2007   Procedure: LEFT HEART CATH AND CORONARY ANGIOGRAPHY; Location: [facility]; Surgeon: Wolm Rhyme, MD   LEFT HEART CATH AND CORS/GRAFTS ANGIOGRAPHY Left 01/13/2017   Procedure: Left Heart Cath and Cors/Grafts Angiography;  Surgeon: Wolm JINNY Rhyme, MD;  Location: [facility];  Service: Cardiovascular;  Laterality: Left;   LEFT HEART CATH AND CORS/GRAFTS ANGIOGRAPHY Left 06/09/2023   Procedure: LEFT HEART CATH AND CORS/GRAFTS ANGIOGRAPHY;  Surgeon: Ammon Blunt, MD;  Location: [facility];  Service: Cardiovascular;  Laterality: Left;   LOWER EXTREMITY ANGIOGRAPHY Left 02/23/2024   Procedure: Lower Extremity Angiography;  Surgeon: Jama Cordella MATSU, MD;  Location: [facility];  Service: Cardiovascular;  Laterality: Left;   LOWER EXTREMITY INTERVENTION Left 02/23/2024   Procedure: LOWER EXTREMITY INTERVENTION;  Surgeon: Jama Cordella MATSU, MD;  Location: [facility];  Service: Cardiovascular;  Laterality: Left;   METATARSAL HEAD EXCISION Right 03/21/2020   Procedure: METATARSAL HEAD RESECTION RIGHT AND DEBRIDEMENT OF ULCER;  Surgeon: Janit Thresa HERO, DPM;  Location: [facility];  Service: Podiatry;  Laterality: Right;   METATARSAL HEAD EXCISION Left 09/15/2023   Procedure: METATARSAL HEAD EXCISION;  Surgeon: Janit Thresa HERO, DPM;  Location: [facility];  Service: Orthopedics/Podiatry;  Laterality: Left;   MULTIPLE TOOTH EXTRACTIONS     TEE WITHOUT CARDIOVERSION  N/A 06/27/2016   Procedure: TRANSESOPHAGEAL ECHOCARDIOGRAM (TEE);  Surgeon: Maude Fleeta Ochoa, MD;  Location[facility] OR;  Service: Open Heart Surgery;  Laterality: N/A;   TENDON LENGTHENING Left 12/04/2023   Procedure: TENDON-ACHILLES LENGTHENING;  Surgeon: Janit Thresa HERO, DPM;  Location: [facility];  Service: Orthopedics/Podiatry;  Laterality: Left;   TRANSMETATARSAL AMPUTATION Left 12/04/2023   Procedure: TRANSMETATARSAL AMPUTATION;  Surgeon: Janit Thresa HERO, DPM;  Location: [facility];  Service: Orthopedics/Podiatry;  Laterality: Left;    Allergies  Allergen Reactions   Latex Other (See Comments)    Blister and burning (42M insulin  pump latex band aid )   Nitroglycerin  Nausea Only and Other (See Comments)    Patches only - headache    Statins Other (See Comments)    BODY PAIN, Pt taking Crestor *      Fluvastatin Other (See Comments)    Muscle pain   Fluvastatin Sodium Other (See Comments)    Myalgias  Percocet [Oxycodone -Acetaminophen ] Itching   Rosuvastatin  Other (See Comments)    Muscle pain, Myalgias   Simvastatin Other (See Comments)    Muscle pain, Myalgias   Lipitor [Atorvastatin] Other (See Comments) and Rash    BODY PAIN   Lisinopril  Other (See Comments) and Nausea Only    Light headed, dizzy Other reaction(s): Dizziness   Zetia [Ezetimibe] Other (See Comments)    Muscle Pain    LT foot 03/01/2024   RT third toe 03/22/2024   LT foot 03/29/2024  Objective/Physical Exam Wound noted to the amputation stump of the right third digit with mixed fibrotic and granular wound base.  Please see above note of photo.  Unimproved.  Today it measures approximately 2.0 x 2.0 x 0.2 cm.  No change since last visit  New onset of a small focal wound to the plantar aspect of the left foot.  Serous drainage noted with expression and debridement of the wound.  No malodor.  No appreciable purulence.  The wound measures approximately 0.3 x 0.3 x 1.5 cm.  It does extend into deeper tissue and probe  to bone  Vascular: Status post angiogram with intervention on 02/23/2024.  Dr. Cordella Shawl [facility] Procedure:  Percutaneous transluminal angioplasty and stent placement within Esprit 2.5 mm stent left anterior tibial artery              2.    Percutaneous transluminal angioplasty to 3 mm left posterior tibial artery and tibioperoneal trunk  Radiographic exam LT foot 03/29/2024: Osteotomies across the metatarsals noted.  There is some slight subtle change to the distal osteotomy of the first metatarsal compared to prior x-rays.  Concern for possible osteomyelitis.  Will observe for now  Assessment: 1. s/p TMA w/ TAL LT. Partial 3rd toe amputation RT. DOS: 12/04/2023 2.  Ulcer right third toe fat layer exposed secondary to diabetes mellitus 3.  History of PVD bilateral lower extremities 4.  New onset of ulcer plantar aspect of the left foot   Plan of Care:  -Patient was evaluated.  - The ulcer to the distal tip of the toe was prepped and debridement performed down to healthier bleeding viable tissue. -Application of 6 cm Mimedx Epicord umbilical cord allograft applied to the ulcer site.  No waste.  Reinforced with Steri-Strips and dry dressings. -Continue WBAT surgical shoes -Return to clinic 2 weeks   Thresa EMERSON Sar, DPM [facility]  Dr. Thresa EMERSON Sar, DPM    [address] [place], [place] [address]                Office [phone]  Fax [phone]

## 2024-04-28 ENCOUNTER — Ambulatory Visit (INDEPENDENT_AMBULATORY_CARE_PROVIDER_SITE_OTHER)
Admission: RE | Admit: 2024-04-28 | Discharge: 2024-04-28 | Disposition: A | Discharge status: Home / Self Care | Source: Ambulatory Visit | Attending: Family Medicine | Admitting: Family Medicine

## 2024-04-28 ENCOUNTER — Ambulatory Visit (INDEPENDENT_AMBULATORY_CARE_PROVIDER_SITE_OTHER): Admitting: Family Medicine

## 2024-04-28 ENCOUNTER — Encounter: Payer: Self-pay | Admitting: Family Medicine

## 2024-04-28 VITALS — BP 130/62 | HR 62 | Temp 97.8°F | Ht 70.0 in | Wt 294.2 lb

## 2024-04-28 DIAGNOSIS — M19012 Primary osteoarthritis, left shoulder: Secondary | ICD-10-CM | POA: Diagnosis not present

## 2024-04-28 DIAGNOSIS — M25512 Pain in left shoulder: Secondary | ICD-10-CM | POA: Diagnosis not present

## 2024-04-28 DIAGNOSIS — M2550 Pain in unspecified joint: Secondary | ICD-10-CM | POA: Diagnosis not present

## 2024-04-28 DIAGNOSIS — E1065 Type 1 diabetes mellitus with hyperglycemia: Secondary | ICD-10-CM | POA: Diagnosis not present

## 2024-04-28 DIAGNOSIS — G8929 Other chronic pain: Secondary | ICD-10-CM

## 2024-04-28 LAB — C-REACTIVE PROTEIN: CRP: <1 mg/dL (ref 0.5–20.0)

## 2024-04-28 LAB — SEDIMENTATION RATE: Sed Rate: 16 mm/h (ref 0–20)

## 2024-04-28 NOTE — Progress Notes (Signed)
 April Colter T. Loranzo Desha, MD, CAQ Sports [facility] [address] [place], [address]  Phone: [phone]  FAX: [phone]  Lawrence [name] - 73 y.o. male  MRN [id number]  Date of Birth: [birth year]  Date: 04/28/2024  PCP: Gretta Comer POUR, NP  Referral: Gretta Comer POUR, NP  Chief Complaint  Patient presents with   Shoulder Pain    Left   Subjective:   Lawrence [name] is a 73 y.o. very pleasantnt male patient with Body mass index is 42.22 kg/m. who presents with the following:  L shoulder pain: The patient is referred from me for evaluation of ongoing left-sided shoulder pain.  Upon questioning, he complains of diffuse multijoint polyarthropathy.  Roughly 4 to 6 weeks ago he developed some pain in the upper arm and in the posterior aspect of the shoulder.  He has had pain in the shoulder blade and in a T-shirt distribution in the arm.  The only thing physical that he can think of that he could have done was helped to take down a fence post.  He is not sure if he had any kind of particular injury.  He does have a painful arc of motion.  Radiographically, he does have some mild glenohumeral joint arthritis  H/o proximal biceps rupture  No old surgeries or broken bones  One small spot of open wound in the foot, being managed by podiatry.  He also is being managed by vascular surgery.  M - lupus MGF: RA  Review of Systems is noted in the HPI, as appropriate  Objective:   BP 130/62   Pulse 62   Temp 97.8 F (36.6 C) (Temporal)   Ht 5' 10 (1.778 m)   Wt 294 lb 4 oz (133.5 kg)   SpO2 96%   BMI 42.22 kg/m   GEN: No acute distress; alert,appropriate. PULM: Breathing comfortably in no respiratory distress PSYCH: Normally interactive.    Shoulder: L Inspection: No muscle wasting or winging Ecchymosis/edema: neg  AC joint, scapula, clavicle: NT Cervical spine: NT, full ROM Spurling's: neg Abduction: Lacks 15  degrees of terminal abduction, 5/5 Flexion: Lacks 15 degrees of terminal abduction, 5/5 IR, full, lift-off: 5/5 ER at neutral: full, 5/5 AC crossover: neg Neer: pos Hawkins: pos Drop Test: neg Jobe: pos Supraspinatus insertion: mild-mod T Bicipital groove: NT Speed's: neg Yergason's: neg Sulcus sign: neg Scapular dyskinesis: none C5-T1 intact  Neuro: Sensation intact Grip 5/5   Laboratory and Imaging Data:  Assessment and Plan:     ICD-10-CM   1. Chronic left shoulder pain  M25.512 Ambulatory referral to Physical Therapy   G89.29     2. Acute pain of left shoulder  M25.512 DG Shoulder Left    3. Polyarthralgia  M25.50 ANA Screen,IFA,Reflex Titer/Pattern,Reflex Mplx 11 Ab Cascade with IdentRA    Sedimentation rate    C-reactive protein    4. Type 1 diabetes mellitus with hyperglycemia (HCC)  E10.65     5. Glenohumeral arthritis, left  M19.012 Ambulatory referral to Physical Therapy     Patient complains of chronic shoulder pain, acute on chronic.  He has some mild glenohumeral joint arthritis.  He also has some pain and loss of terminal motion, and I am going to have him work on some home rehab and do formal physical therapy.  He also has some complaints of diffuse multijoint polyarthropathy.  He has multiple family members with rheumatological conditions.  I am going to check  a sed rate, CRP, as well as a rheumatological cascade.  No intervention or injection, open wound in the foot.  Medication Management during today's office visit: No orders of the defined types were placed in this encounter.  Medications Discontinued During This Encounter  Medication Reason   doxycycline  (VIBRA -TABS) 100 MG tablet Completed Course   doxycycline  (VIBRA -TABS) 100 MG tablet Completed Course   doxycycline  (VIBRA -TABS) 100 MG tablet Completed Course   mupirocin  ointment (BACTROBAN ) 2 % Completed Course   oxyCODONE -acetaminophen  (PERCOCET) 5-325 MG tablet Completed Course    doxycycline  (VIBRA -TABS) 100 MG tablet Completed Course   doxycycline  (VIBRA -TABS) 100 MG tablet Completed Course   doxycycline  (VIBRA -TABS) 100 MG tablet Completed Course   mupirocin  ointment (BACTROBAN ) 2 % Completed Course   oxyCODONE -acetaminophen  (PERCOCET) 5-325 MG tablet Completed Course    Orders placed today for conditions managed today: Orders Placed This Encounter  Procedures   DG Shoulder Left   ANA Screen,IFA,Reflex Titer/Pattern,Reflex Mplx 11 Ab Cascade with IdentRA   Sedimentation rate   C-reactive protein   Ambulatory referral to Physical Therapy    Disposition: 6 to 8 weeks follow-up  Dragon [employer] One speech-to-text software was used for transcription in this dictation.  Possible transcriptional errors can occur using Animal nutritionist.   Signed,  Jacques DASEN. Melvie Paglia, MD   Outpatient Encounter Medications as of 04/28/2024  Medication Sig   acetaminophen  (TYLENOL ) 500 MG tablet Take 1,000 mg by mouth every 6 (six) hours as needed for moderate pain (pain score 4-6).   AIMOVIG 140 MG/ML SOAJ Inject 140 mg into the skin every 30 (thirty) days.   albuterol  (VENTOLIN  HFA) 108 (90 Base) MCG/ACT inhaler Inhale 2 puffs into the lungs every 4 (four) hours as needed for wheezing.   amoxicillin  (AMOXIL ) 500 MG tablet Take 2,000 mg by mouth See admin instructions. Take 4 capsules (2000 mg) by mouth 1 hour prior to dental appointments   aspirin  81 MG EC tablet Take 81 mg by mouth daily. Swallow whole.   Calcium  Carb-Cholecalciferol  (CALCIUM  + D3 PO) Take 1 tablet by mouth in the morning and at bedtime.   carvedilol (COREG) 12.5 MG tablet Take 12.5 mg by mouth 2 (two) times daily with a meal.   Cholecalciferol  (VITAMIN D ) 50 MCG (2000 UT) tablet Take 2,000 Units by mouth in the morning and at bedtime.   clopidogrel  (PLAVIX ) 75 MG tablet Take 1 tablet (75 mg total) by mouth daily.   famotidine  (ACID CONTROLLER) 10 MG tablet Take 10 mg by mouth in the morning.   fluticasone  (FLONASE) 50 MCG/ACT nasal spray Place 1 spray into both nostrils daily as needed for allergies.   furosemide  (LASIX ) 40 MG tablet Take 1 tablet (40 mg total) by mouth daily.   glucagon 1 MG injection Inject 1 mg into the skin once as needed.   GLUTOSE 15 40 % GEL Take 1 Tube by mouth once as needed for low blood sugar.   insulin  aspart (NOVOLOG ) 100 UNIT/ML injection Inject 20 Units into the skin 3 (three) times daily. (Patient taking differently: Inject 10-20 Units into the skin 3 (three) times daily with meals. Sliding Scale will go up to 25 units for BG > 300 - 400)   insulin  glargine-yfgn (SEMGLEE ) 100 UNIT/ML injection Inject 20 Units into the skin at bedtime.   isosorbide  mononitrate (IMDUR ) 60 MG 24 hr tablet Take 60 mg by mouth in the morning and at bedtime.   loratadine  (CLARITIN ) 10 MG tablet Take 10 mg by mouth in  the morning.   nitroGLYCERIN  (NITROSTAT ) 0.4 MG SL tablet DISSOLVE 1 TABLET UNDER THE TONGUE EVERY 5 MINUTES AS NEEDED FOR CHEST PAIN   potassium chloride  SA (K-DUR,KLOR-CON ) 20 MEQ tablet Take 1 tablet (20 mEq total) by mouth daily.   rosuvastatin  (CRESTOR ) 20 MG tablet Take 1 tablet (20 mg total) by mouth daily at 6 PM.   Travoprost, BAK Free, (TRAVATAN) 0.004 % SOLN ophthalmic solution Place 1 drop into both eyes at bedtime.    vitamin C  (ASCORBIC ACID ) 500 MG tablet Take 500 mg by mouth 2 (two) times daily.    [DISCONTINUED] doxycycline  (VIBRA -TABS) 100 MG tablet Take 1 tablet (100 mg total) by mouth 2 (two) times daily. (Patient not taking: Reported on 04/14/2024)   [DISCONTINUED] doxycycline  (VIBRA -TABS) 100 MG tablet Take 1 tablet (100 mg total) by mouth 2 (two) times daily. (Patient not taking: Reported on 04/14/2024)   [DISCONTINUED] doxycycline  (VIBRA -TABS) 100 MG tablet Take 1 tablet (100 mg total) by mouth 2 (two) times daily. (Patient not taking: Reported on 04/14/2024)   [DISCONTINUED] mupirocin  ointment (BACTROBAN ) 2 % Apply 1 Application topically 2 (two) times  daily. (Patient not taking: Reported on 04/14/2024)   [DISCONTINUED] oxyCODONE -acetaminophen  (PERCOCET) 5-325 MG tablet Take 1 tablet by mouth every 4 (four) hours as needed for severe pain (pain score 7-10). (Patient not taking: Reported on 04/14/2024)   No facility-administered encounter medications on file as of 04/28/2024.

## 2024-04-29 ENCOUNTER — Encounter: Payer: Self-pay | Admitting: Podiatry

## 2024-04-29 ENCOUNTER — Ambulatory Visit (INDEPENDENT_AMBULATORY_CARE_PROVIDER_SITE_OTHER): Admitting: Podiatry

## 2024-04-29 VITALS — Ht 70.0 in | Wt 294.2 lb

## 2024-04-29 DIAGNOSIS — L97512 Non-pressure chronic ulcer of other part of right foot with fat layer exposed: Secondary | ICD-10-CM | POA: Diagnosis not present

## 2024-04-29 DIAGNOSIS — E11621 Type 2 diabetes mellitus with foot ulcer: Secondary | ICD-10-CM

## 2024-04-29 NOTE — Progress Notes (Signed)
 Chief Complaint  Patient presents with   Diabetic Ulcer    Pt is here to f/u on right foot due to ulcer pt states other then his normal aches and pains he has no complaints.    Subjective:  Patient presents today status post TMA with TAL left.  Partial toe amputation RT third.  DOS: 12/04/2023.  Follow-up evaluation of ulcer to the distal tip of the right third toe partial amputation site.  Amniotic tissue allograft was applied on 04/12/2024.  Dressings have been clean dry and intact since application  Past Medical History:  Diagnosis Date   Allergies    Anginal pain (HCC)    Bilateral carotid artery disease (HCC)    Breast pain, left 08/29/2022   CAD (coronary artery disease) 07/2007   a.) s/p NSTEMI 07/2007 - sig multi-vessel CAD not amenable to PCI; b.) LHC 06/11/2016: multi vessel CAD not amenable to PCI --> refer to CVTS; c.) s/p 4v CABG 06/27/2016; d.) LHC 06/09/2023: 75% mLM, 80% o-pLAD, 60% mLAD, 100% p-mLCx, 100% OM1, 95% pRCA, 100% mRCA, 50% m-dLAD. LIMA-LAD and SVG-OM1 patent. CTO SVG-RCA with L-R collaterals - med mgmt   Carpal tunnel syndrome on left    a.) s/p release 04/2016   CKD (chronic kidney disease), stage III (HCC)    COVID-19 12/09/2022   Diabetic peripheral neuropathy (HCC)    Diastolic dysfunction    a.) TTE 06/25/2016: EF 60-654%. no RWMAs, G1DD, mild AoV annular calc; b.) TTE 03/19/2017: EF 50-55%, no RWMAs, mild MR; c.) TTE 07/22/2019: EF 55-60%, midl LVH, no RWMAs, triv MR, mild TR   Dizziness 04/30/2021   Dyspnea    GERD (gastroesophageal reflux disease)    Glaucoma    Headache    History of bilateral cataract extraction    HLD (hyperlipidemia)    HTN (hypertension)    Long term current use of clopidogrel     Long-term use of aspirin  therapy    MRSA (methicillin resistant staph aureus) culture positive    2006 - 2008   Myocardial infarction (HCC) 04/04/2016   NSTEMI (non-ST elevated myocardial infarction) (HCC) 07/27/2007   a.) LHC 07/30/2007: 30%  mLAD, 99% D1, 50% pLCx, 40% mLCx, 70/75/85% dLCx, 50% OM1, 75/80/80 OM2, 50% pRCA, 60% mRCA, 85% dRCA --> not amenable to PCI   OSA on CPAP    Osteoarthritis    PAD (peripheral artery disease) (HCC)    a. 03/2022 Lower Ext Angio: No signif Ao-iliac dzs. R PT diff dzs and occluded above ankle. R Pedal arch intact w/ excellent antegrade flow provided by R AT-->no indication for revasc.   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   a.) LIMA-LAD, SVG-RCA, seq SVG-OM1-OM2   S/P tube myringotomy    Skin cancer, basal cell    Skin ulcer of right great toe, unspecified ulcer stage (HCC)    T2DM (type 2 diabetes mellitus) (HCC)    Wears glasses    Wears partial dentures    top    Past Surgical History:  Procedure Laterality Date   ABDOMINAL AORTOGRAM W/LOWER EXTREMITY N/A 03/26/2022   Procedure: ABDOMINAL AORTOGRAM W/LOWER EXTREMITY;  Surgeon: Darron Deatrice LABOR, MD;  Location: [facility];  Service: Cardiovascular;  Laterality: N/A;   AMPUTATION TOE Bilateral 06/26/2023   Procedure: AMPUTATION TOE MPJ OF THE JOINT,PARTIAL TOE AMPUTUATION;  Surgeon: Janit Thresa HERO, DPM;  Location: [facility];  Service: Orthopedics/Podiatry;  Laterality: Bilateral;   AMPUTATION TOE Right 12/04/2023  Procedure: AMPUTATION TOE INTERPHALANGEAL 3RD;  Surgeon: Janit Thresa HERO, DPM;  Location: [facility];  Service: Orthopedics/Podiatry;  Laterality: Right;   APPENDECTOMY     CARDIAC CATHETERIZATION N/A 06/11/2016   Procedure: Left Heart Cath and Coronary Angiography;  Surgeon: Wolm JINNY Rhyme, MD;  Location: [facility];  Service: Cardiovascular;  Laterality: N/A;   CARPAL TUNNEL RELEASE Left 04/24/2016   Procedure: CARPAL TUNNEL RELEASE;  Surgeon: Ozell Flake, MD;  Location: [facility];  Service: Orthopedics;  Laterality: Left;   CATARACT EXTRACTION W/ INTRAOCULAR LENS  IMPLANT, BILATERAL Bilateral    CATARACT EXTRACTION, BILATERAL     R eye 07/16/12, L eye 08/13/12 - with  lens implant in both eyes   CORONARY ARTERY BYPASS GRAFT N/A 06/27/2016   Procedure: CORONARY ARTERY BYPASS GRAFTING times four using left internal mammary artery and right leg saphenous vein;  Surgeon: Maude Fleeta Ochoa, MD;  Location: [facility];  Service: Open Heart Surgery;  Laterality: N/A;   FOREARM FRACTURE SURGERY Left 1961   KNEE ARTHROPLASTY Left 02/11/2016   Procedure: COMPUTER ASSISTED TOTAL KNEE ARTHROPLASTY;  Surgeon: Lynwood SHAUNNA Hue, MD;  Location: [facility];  Service: Orthopedics;  Laterality: Left;   KNEE ARTHROSCOPY Right 2000   LEFT HEART CATH AND CORONARY ANGIOGRAPHY Left 07/30/2007   Procedure: LEFT HEART CATH AND CORONARY ANGIOGRAPHY; Location: [facility]; Surgeon: Wolm Rhyme, MD   LEFT HEART CATH AND CORS/GRAFTS ANGIOGRAPHY Left 01/13/2017   Procedure: Left Heart Cath and Cors/Grafts Angiography;  Surgeon: Wolm JINNY Rhyme, MD;  Location: [facility];  Service: Cardiovascular;  Laterality: Left;   LEFT HEART CATH AND CORS/GRAFTS ANGIOGRAPHY Left 06/09/2023   Procedure: LEFT HEART CATH AND CORS/GRAFTS ANGIOGRAPHY;  Surgeon: Ammon Blunt, MD;  Location: [facility];  Service: Cardiovascular;  Laterality: Left;   LOWER EXTREMITY ANGIOGRAPHY Left 02/23/2024   Procedure: Lower Extremity Angiography;  Surgeon: Jama Cordella MATSU, MD;  Location: [facility];  Service: Cardiovascular;  Laterality: Left;   LOWER EXTREMITY INTERVENTION Left 02/23/2024   Procedure: LOWER EXTREMITY INTERVENTION;  Surgeon: Jama Cordella MATSU, MD;  Location: [facility];  Service: Cardiovascular;  Laterality: Left;   METATARSAL HEAD EXCISION Right 03/21/2020   Procedure: METATARSAL HEAD RESECTION RIGHT AND DEBRIDEMENT OF ULCER;  Surgeon: Janit Thresa HERO, DPM;  Location: [facility];  Service: Podiatry;  Laterality: Right;   METATARSAL HEAD EXCISION Left 09/15/2023   Procedure: METATARSAL HEAD EXCISION;  Surgeon: Janit Thresa HERO, DPM;  Location: [facility];  Service:  Orthopedics/Podiatry;  Laterality: Left;   MULTIPLE TOOTH EXTRACTIONS     TEE WITHOUT CARDIOVERSION N/A 06/27/2016   Procedure: TRANSESOPHAGEAL ECHOCARDIOGRAM (TEE);  Surgeon: Maude Fleeta Ochoa, MD;  Location: [facility];  Service: Open Heart Surgery;  Laterality: N/A;   TENDON LENGTHENING Left 12/04/2023   Procedure: TENDON-ACHILLES LENGTHENING;  Surgeon: Janit Thresa HERO, DPM;  Location: [facility];  Service: Orthopedics/Podiatry;  Laterality: Left;   TRANSMETATARSAL AMPUTATION Left 12/04/2023   Procedure: TRANSMETATARSAL AMPUTATION;  Surgeon: Janit Thresa HERO, DPM;  Location: [facility];  Service: Orthopedics/Podiatry;  Laterality: Left;    Allergies  Allergen Reactions   Latex Other (See Comments)    Blister and burning (85M insulin  pump latex band aid )   Nitroglycerin  Nausea Only and Other (See Comments)    Patches only - headache    Statins Other (See Comments)    BODY PAIN, Pt taking Crestor *      Fluvastatin Other (See Comments)    Muscle pain   Fluvastatin  Sodium Other (See Comments)    Myalgias   Percocet [Oxycodone -Acetaminophen ] Itching   Rosuvastatin  Other (See Comments)    Muscle pain, Myalgias   Simvastatin Other (See Comments)    Muscle pain, Myalgias   Lipitor [Atorvastatin] Other (See Comments) and Rash    BODY PAIN   Lisinopril  Other (See Comments) and Nausea Only    Light headed, dizzy Other reaction(s): Dizziness   Zetia [Ezetimibe] Other (See Comments)    Muscle Pain    LT foot 03/01/2024   RT third toe 03/22/2024   RT third toe 04/29/2024  Objective/Physical Exam Wound noted to the amputation stump of the right third digit with mixed fibrotic and granular wound base.  Please see above note of photo.  Unimproved.  Today it measures approximately 1.5 x 1.5 x 0.2 cm.   Currently the left foot amputation stump appears very stable.  Vascular: Status post angiogram with intervention on 02/23/2024.  Dr. Cordella Shawl [facility] Procedure:  Percutaneous  transluminal angioplasty and stent placement within Esprit 2.5 mm stent left anterior tibial artery              2.    Percutaneous transluminal angioplasty to 3 mm left posterior tibial artery and tibioperoneal trunk  Radiographic exam LT foot 03/29/2024: Osteotomies across the metatarsals noted.  There is some slight subtle change to the distal osteotomy of the first metatarsal compared to prior x-rays.  Concern for possible osteomyelitis.  Will observe for now  Assessment: 1. s/p TMA w/ TAL LT. Partial 3rd toe amputation RT. DOS: 12/04/2023 2.  Ulcer right third toe fat layer exposed secondary to diabetes mellitus w/ peripheral polyneuropathy 3.  History of PVD bilateral lower extremities 4.  New onset of ulcer plantar aspect of the left foot   Plan of Care:  -Patient was evaluated.  - The ulcer to the distal tip of the toe was prepped and debridement performed down to healthier bleeding viable tissue. -Application of 15mm InnovaMatrix AC allograft applied to the ulcer site.  No waste.  Reinforced with Steri-Strips and dry dressings. -Continue WBAT surgical shoes -Return to clinic 2 weeks  REF: [id number] LOT: [id number]   Thresa EMERSON Sar, DPM [facility]  Dr. Thresa EMERSON Sar, DPM    [address] [place], [place] [address]                Office [phone]  Fax [phone]

## 2024-05-03 ENCOUNTER — Ambulatory Visit
Admission: RE | Admit: 2024-05-03 | Discharge: 2024-05-03 | Disposition: A | Discharge status: Home / Self Care | Attending: Vascular Surgery | Admitting: Vascular Surgery

## 2024-05-03 ENCOUNTER — Encounter
Admission: RE | Disposition: A | Discharge status: Home / Self Care | Payer: Self-pay | Source: Home / Self Care | Attending: Vascular Surgery

## 2024-05-03 ENCOUNTER — Telehealth (INDEPENDENT_AMBULATORY_CARE_PROVIDER_SITE_OTHER): Payer: Self-pay | Admitting: Vascular Surgery

## 2024-05-03 ENCOUNTER — Encounter: Payer: Self-pay | Admitting: Vascular Surgery

## 2024-05-03 ENCOUNTER — Ambulatory Visit: Payer: Self-pay | Admitting: Family Medicine

## 2024-05-03 DIAGNOSIS — L97909 Non-pressure chronic ulcer of unspecified part of unspecified lower leg with unspecified severity: Secondary | ICD-10-CM

## 2024-05-03 DIAGNOSIS — E1065 Type 1 diabetes mellitus with hyperglycemia: Secondary | ICD-10-CM | POA: Insufficient documentation

## 2024-05-03 DIAGNOSIS — I25119 Atherosclerotic heart disease of native coronary artery with unspecified angina pectoris: Secondary | ICD-10-CM | POA: Insufficient documentation

## 2024-05-03 DIAGNOSIS — L97919 Non-pressure chronic ulcer of unspecified part of right lower leg with unspecified severity: Secondary | ICD-10-CM | POA: Diagnosis not present

## 2024-05-03 DIAGNOSIS — Z89421 Acquired absence of other right toe(s): Secondary | ICD-10-CM | POA: Diagnosis not present

## 2024-05-03 DIAGNOSIS — I10 Essential (primary) hypertension: Secondary | ICD-10-CM | POA: Insufficient documentation

## 2024-05-03 DIAGNOSIS — I6523 Occlusion and stenosis of bilateral carotid arteries: Secondary | ICD-10-CM | POA: Diagnosis not present

## 2024-05-03 DIAGNOSIS — I70239 Atherosclerosis of native arteries of right leg with ulceration of unspecified site: Secondary | ICD-10-CM

## 2024-05-03 DIAGNOSIS — E1051 Type 1 diabetes mellitus with diabetic peripheral angiopathy without gangrene: Secondary | ICD-10-CM | POA: Diagnosis present

## 2024-05-03 DIAGNOSIS — Z794 Long term (current) use of insulin: Secondary | ICD-10-CM | POA: Diagnosis not present

## 2024-05-03 DIAGNOSIS — I700 Atherosclerosis of aorta: Secondary | ICD-10-CM | POA: Diagnosis not present

## 2024-05-03 DIAGNOSIS — Z87891 Personal history of nicotine dependence: Secondary | ICD-10-CM | POA: Diagnosis not present

## 2024-05-03 DIAGNOSIS — Z89422 Acquired absence of other left toe(s): Secondary | ICD-10-CM | POA: Diagnosis not present

## 2024-05-03 DIAGNOSIS — L97519 Non-pressure chronic ulcer of other part of right foot with unspecified severity: Secondary | ICD-10-CM | POA: Diagnosis not present

## 2024-05-03 DIAGNOSIS — I70202 Unspecified atherosclerosis of native arteries of extremities, left leg: Secondary | ICD-10-CM

## 2024-05-03 DIAGNOSIS — E10621 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: Secondary | ICD-10-CM | POA: Insufficient documentation

## 2024-05-03 DIAGNOSIS — I70235 Atherosclerosis of native arteries of right leg with ulceration of other part of foot: Secondary | ICD-10-CM | POA: Diagnosis not present

## 2024-05-03 HISTORY — PX: LOWER EXTREMITY ANGIOGRAPHY: CATH118251

## 2024-05-03 LAB — CREATININE, SERUM
Creatinine, Ser: 1.09 mg/dL (ref 0.61–1.24)
GFR, Estimated: >60 mL/min (ref >60)

## 2024-05-03 LAB — ANA SCREEN,IFA,REFLEX TITER/PATTERN,REFLEX MPLX 11 AB CASCADE
Anti Nuclear Antibody (ANA): NEGATIVE
Cyclic Citrullin Peptide Ab: <16 U
MUTATED CITRULLINATED VIMENTIN (MCV) AB: <20 U/mL (ref <20)
Rheumatoid fact SerPl-aCnc: <10 [IU]/mL (ref <14)

## 2024-05-03 LAB — BUN: BUN: 32 mg/dL — ABNORMAL HIGH (ref 8–23)

## 2024-05-03 LAB — GLUCOSE, CAPILLARY
Glucose-Capillary: 89 mg/dL (ref 70–99)
Glucose-Capillary: 106 mg/dL — ABNORMAL HIGH (ref 70–99)

## 2024-05-03 SURGERY — LOWER EXTREMITY ANGIOGRAPHY
Anesthesia: Moderate Sedation | Site: Leg Lower | Laterality: Right

## 2024-05-03 MED ORDER — METHYLPREDNISOLONE SODIUM SUCC 125 MG IJ SOLR: 125.0000 mg | Freq: Once | INTRAMUSCULAR | Status: DC | PRN

## 2024-05-03 MED ORDER — SODIUM CHLORIDE 0.9 % IV SOLN: INTRAVENOUS | Status: DC

## 2024-05-03 MED ORDER — MORPHINE SULFATE (PF) 4 MG/ML IV SOLN: 2.0000 mg | INTRAVENOUS | Status: DC | PRN

## 2024-05-03 MED ORDER — HEPARIN SODIUM (PORCINE) 1000 UNIT/ML IJ SOLN
INTRAMUSCULAR | Status: AC
  Filled 2024-05-03: qty 10

## 2024-05-03 MED ORDER — FENTANYL CITRATE (PF) 100 MCG/2ML IJ SOLN
INTRAMUSCULAR | Status: AC
  Filled 2024-05-03: qty 2

## 2024-05-03 MED ORDER — IOHEXOL 300 MG/ML  SOLN
INTRAMUSCULAR | Status: DC | PRN
  Administered 2024-05-03: 50 mL

## 2024-05-03 MED ORDER — SODIUM CHLORIDE 0.9% FLUSH: 3.0000 mL | INTRAVENOUS | Status: DC | PRN

## 2024-05-03 MED ORDER — SODIUM CHLORIDE 0.9% FLUSH: 3.0000 mL | Freq: Two times a day (BID) | INTRAVENOUS | Status: DC

## 2024-05-03 MED ORDER — FAMOTIDINE 20 MG PO TABS: 40.0000 mg | ORAL_TABLET | Freq: Once | ORAL | Status: DC | PRN

## 2024-05-03 MED ORDER — ONDANSETRON HCL 4 MG/2ML IJ SOLN: 4.0000 mg | Freq: Four times a day (QID) | INTRAMUSCULAR | Status: DC | PRN

## 2024-05-03 MED ORDER — LABETALOL HCL 5 MG/ML IV SOLN: 10.0000 mg | INTRAVENOUS | Status: DC | PRN

## 2024-05-03 MED ORDER — CEFAZOLIN SODIUM-DEXTROSE 2-4 GM/100ML-% IV SOLN
INTRAVENOUS | Status: AC
  Filled 2024-05-03: qty 100

## 2024-05-03 MED ORDER — CEFAZOLIN SODIUM-DEXTROSE 2-4 GM/100ML-% IV SOLN
2.0000 g | INTRAVENOUS | Status: AC
  Administered 2024-05-03: 2 g via INTRAVENOUS

## 2024-05-03 MED ORDER — HYDRALAZINE HCL 20 MG/ML IJ SOLN: 5.0000 mg | INTRAMUSCULAR | Status: DC | PRN

## 2024-05-03 MED ORDER — HEPARIN SODIUM (PORCINE) 1000 UNIT/ML IJ SOLN
INTRAMUSCULAR | Status: DC | PRN
  Administered 2024-05-03: 6000 [IU] via INTRAVENOUS

## 2024-05-03 MED ORDER — MIDAZOLAM HCL 2 MG/ML PO SYRP
8.0000 mg | ORAL_SOLUTION | Freq: Once | ORAL | Status: DC | PRN
Start: 2024-05-03 — End: 2024-05-03

## 2024-05-03 MED ORDER — LIDOCAINE HCL (PF) 1 % IJ SOLN
INTRAMUSCULAR | Status: DC | PRN
Start: 2024-05-03 — End: 2024-05-03
  Administered 2024-05-03: 10 mL

## 2024-05-03 MED ORDER — HEPARIN (PORCINE) IN NACL 2000-0.9 UNIT/L-% IV SOLN
INTRAVENOUS | Status: DC | PRN
  Administered 2024-05-03: 1000 mL

## 2024-05-03 MED ORDER — DIPHENHYDRAMINE HCL 50 MG/ML IJ SOLN
50.0000 mg | Freq: Once | INTRAMUSCULAR | Status: DC | PRN
Start: 2024-05-03 — End: 2024-05-03

## 2024-05-03 MED ORDER — HYDROMORPHONE HCL 1 MG/ML IJ SOLN: 1.0000 mg | Freq: Once | INTRAMUSCULAR | Status: DC | PRN

## 2024-05-03 MED ORDER — FENTANYL CITRATE (PF) 100 MCG/2ML IJ SOLN
INTRAMUSCULAR | Status: DC | PRN
  Administered 2024-05-03: 50 ug via INTRAVENOUS
  Administered 2024-05-03: 25 ug via INTRAVENOUS

## 2024-05-03 MED ORDER — MIDAZOLAM HCL 2 MG/2ML IJ SOLN
INTRAMUSCULAR | Status: DC | PRN
  Administered 2024-05-03: 2 mg via INTRAVENOUS
  Administered 2024-05-03: 1 mg via INTRAVENOUS

## 2024-05-03 MED ORDER — SODIUM CHLORIDE 0.9 % IV SOLN: 250.0000 mL | INTRAVENOUS | Status: DC | PRN

## 2024-05-03 MED ORDER — MIDAZOLAM HCL 5 MG/5ML IJ SOLN
INTRAMUSCULAR | Status: AC
  Filled 2024-05-03: qty 5

## 2024-05-03 SURGICAL SUPPLY — 19 items
BALLOON ULTRSCRE 014 3X40X150 (BALLOONS) IMPLANT
CATH ANGIO 5F PIGTAIL 65CM (CATHETERS) IMPLANT
CATH BEACON TIP VERT 5FR 125 (CATHETERS) IMPLANT
COVER PROBE ULTRASOUND 5X96 (MISCELLANEOUS) IMPLANT
DEVICE STARCLOSE SE CLOSURE (Vascular Products) IMPLANT
GLIDEWIRE ADV .014X300CM (WIRE) IMPLANT
GLIDEWIRE ADV .035X260CM (WIRE) IMPLANT
GOWN STRL REUS W/ TWL LRG LVL3 (GOWN DISPOSABLE) ×1 IMPLANT
KIT MICROPUNCTURE VSI 5F STIFF (SHEATH) IMPLANT
NDL ENTRY 21GA 7CM ECHOTIP (NEEDLE) IMPLANT
NEEDLE ENTRY 21GA 7CM ECHOTIP (NEEDLE) ×1 IMPLANT
PACK ANGIOGRAPHY (CUSTOM PROCEDURE TRAY) ×1 IMPLANT
SET INTRO CAPELLA COAXIAL (SET/KITS/TRAYS/PACK) IMPLANT
SHEATH BRITE TIP 5FRX11 (SHEATH) IMPLANT
SHEATH RAABE 6FRX70 (SHEATH) IMPLANT
SYR MEDRAD MARK 7 150ML (SYRINGE) IMPLANT
TUBING CONTRAST HIGH PRESS 72 (TUBING) IMPLANT
WIRE COMMAND ST STR 014 300 (WIRE) IMPLANT
WIRE J 3MM .035X145CM (WIRE) IMPLANT

## 2024-05-03 NOTE — Progress Notes (Signed)
 Patient now has TMA need to build FF filler will make and leave in [place] office for him to PU  Lolita Schultze Cped Ppw needs to be re-dated as well

## 2024-05-03 NOTE — H&P (View-Only) (Signed)
 MRN : [id number]  Lawrence [name] is a 73 y.o. (26-Apr-1951) male who presents with chief complaint of check circulation.  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  The patient notes the ulcer has been present for multiple weeks and has not been improving.  It is very painful and has had some drainage.  No specific history of trauma noted by the patient.  The patient denies fever or chills.  the patient does have diabetes which has been difficult to control.   Patient notes prior to the ulcer developing the extremities were painful particularly with walking.   Patient notes his left leg is done very well status post angiography with intervention.  At the time of angiography of the left lower extremity he was noted to have primarily tibial disease   The patient denies rest pain or dangling of an extremity off the side of the bed during the night for relief. No prior interventions or surgeries.   No history of back problems or DJD of the lumbar sacral spine.    The patient denies amaurosis fugax or recent TIA symptoms. There are no recent neurological changes noted. The patient denies history of DVT, PE or superficial thrombophlebitis. The patient denies recent episodes of angina or shortness of breath.     Current Meds  Medication Sig   albuterol  (VENTOLIN  HFA) 108 (90 Base) MCG/ACT inhaler Inhale 2 puffs into the lungs every 4 (four) hours as needed for wheezing.   Calcium  Carb-Cholecalciferol  (CALCIUM  + D3 PO) Take 1 tablet by mouth in the morning and at bedtime.   carvedilol (COREG) 12.5 MG tablet Take 12.5 mg by mouth 2 (two) times daily with a meal.   Cholecalciferol  (VITAMIN D ) 50 MCG (2000 UT) tablet Take 2,000 Units by mouth in the morning and at  bedtime.   famotidine  (ACID CONTROLLER) 10 MG tablet Take 10 mg by mouth in the morning.   furosemide  (LASIX ) 40 MG tablet Take 1 tablet (40 mg total) by mouth daily.   insulin  glargine-yfgn (SEMGLEE ) 100 UNIT/ML injection Inject 20 Units into the skin at bedtime.   isosorbide  mononitrate (IMDUR ) 60 MG 24 hr tablet Take 60 mg by mouth in the morning and at bedtime.   loratadine  (CLARITIN ) 10 MG tablet Take 10 mg by mouth in the morning.   potassium chloride  SA (K-DUR,KLOR-CON ) 20 MEQ tablet Take 1 tablet (20 mEq total) by mouth daily.   rosuvastatin  (CRESTOR ) 20 MG tablet Take 1 tablet (20 mg total) by mouth daily at 6 PM.   Travoprost, BAK Free, (TRAVATAN) 0.004 % SOLN ophthalmic solution Place 1 drop into both eyes at bedtime.    vitamin C  (ASCORBIC ACID ) 500 MG tablet Take 500 mg by mouth 2 (two) times daily.     Past Medical History:  Diagnosis Date   Allergies    Anginal pain (HCC)    Bilateral carotid artery disease (HCC)    Breast pain, left 08/29/2022   CAD (coronary  artery disease) 07/2007   a.) s/p NSTEMI 07/2007 - sig multi-vessel CAD not amenable to PCI; b.) LHC 06/11/2016: multi vessel CAD not amenable to PCI --> refer to CVTS; c.) s/p 4v CABG 06/27/2016; d.) LHC 06/09/2023: 75% mLM, 80% o-pLAD, 60% mLAD, 100% p-mLCx, 100% OM1, 95% pRCA, 100% mRCA, 50% m-dLAD. LIMA-LAD and SVG-OM1 patent. CTO SVG-RCA with L-R collaterals - med mgmt   Carpal tunnel syndrome on left    a.) s/p release 04/2016   CKD (chronic kidney disease), stage III (HCC)    COVID-19 12/09/2022   Diabetic peripheral neuropathy (HCC)    Diastolic dysfunction    a.) TTE 06/25/2016: EF 60-654%. no RWMAs, G1DD, mild AoV annular calc; b.) TTE 03/19/2017: EF 50-55%, no RWMAs, mild MR; c.) TTE 07/22/2019: EF 55-60%, midl LVH, no RWMAs, triv MR, mild TR   Dizziness 04/30/2021   Dyspnea    GERD (gastroesophageal reflux disease)    Glaucoma    Headache    History of bilateral cataract extraction    HLD  (hyperlipidemia)    HTN (hypertension)    Long term current use of clopidogrel     Long-term use of aspirin  therapy    MRSA (methicillin resistant staph aureus) culture positive    2006 - 2008   Myocardial infarction (HCC) 04/04/2016   NSTEMI (non-ST elevated myocardial infarction) (HCC) 07/27/2007   a.) LHC 07/30/2007: 30% mLAD, 99% D1, 50% pLCx, 40% mLCx, 70/75/85% dLCx, 50% OM1, 75/80/80 OM2, 50% pRCA, 60% mRCA, 85% dRCA --> not amenable to PCI   OSA on CPAP    Osteoarthritis    PAD (peripheral artery disease) (HCC)    a. 03/2022 Lower Ext Angio: No signif Ao-iliac dzs. R PT diff dzs and occluded above ankle. R Pedal arch intact w/ excellent antegrade flow provided by R AT-->no indication for revasc.   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   a.) LIMA-LAD, SVG-RCA, seq SVG-OM1-OM2   S/P tube myringotomy    Skin cancer, basal cell    Skin ulcer of right great toe, unspecified ulcer stage (HCC)    T2DM (type 2 diabetes mellitus) (HCC)    Wears glasses    Wears partial dentures    top    Past Surgical History:  Procedure Laterality Date   ABDOMINAL AORTOGRAM W/LOWER EXTREMITY N/A 03/26/2022   Procedure: ABDOMINAL AORTOGRAM W/LOWER EXTREMITY;  Surgeon: Darron Deatrice LABOR, MD;  Location: [facility];  Service: Cardiovascular;  Laterality: N/A;   AMPUTATION TOE Bilateral 06/26/2023   Procedure: AMPUTATION TOE MPJ OF THE JOINT,PARTIAL TOE AMPUTUATION;  Surgeon: Janit Thresa HERO, DPM;  Location: [facility];  Service: Orthopedics/Podiatry;  Laterality: Bilateral;   AMPUTATION TOE Right 12/04/2023   Procedure: AMPUTATION TOE INTERPHALANGEAL 3RD;  Surgeon: Janit Thresa HERO, DPM;  Location: [facility];  Service: Orthopedics/Podiatry;  Laterality: Right;   APPENDECTOMY     CARDIAC CATHETERIZATION N/A 06/11/2016   Procedure: Left Heart Cath and Coronary Angiography;  Surgeon: Wolm JINNY Rhyme, MD;  Location: [facility];  Service: Cardiovascular;   Laterality: N/A;   CARPAL TUNNEL RELEASE Left 04/24/2016   Procedure: CARPAL TUNNEL RELEASE;  Surgeon: Ozell Flake, MD;  Location: [facility];  Service: Orthopedics;  Laterality: Left;   CATARACT EXTRACTION W/ INTRAOCULAR LENS  IMPLANT, BILATERAL Bilateral    CATARACT EXTRACTION, BILATERAL     R eye 07/16/12, L eye 08/13/12 - with lens implant in both eyes   CORONARY ARTERY BYPASS GRAFT N/A 06/27/2016  Procedure: CORONARY ARTERY BYPASS GRAFTING times four using left internal mammary artery and right leg saphenous vein;  Surgeon: Maude Fleeta Ochoa, MD;  Location: [facility];  Service: Open Heart Surgery;  Laterality: N/A;   FOREARM FRACTURE SURGERY Left 1961   KNEE ARTHROPLASTY Left 02/11/2016   Procedure: COMPUTER ASSISTED TOTAL KNEE ARTHROPLASTY;  Surgeon: Lynwood SHAUNNA Hue, MD;  Location: [facility];  Service: Orthopedics;  Laterality: Left;   KNEE ARTHROSCOPY Right 2000   LEFT HEART CATH AND CORONARY ANGIOGRAPHY Left 07/30/2007   Procedure: LEFT HEART CATH AND CORONARY ANGIOGRAPHY; Location: [facility]; Surgeon: Wolm Rhyme, MD   LEFT HEART CATH AND CORS/GRAFTS ANGIOGRAPHY Left 01/13/2017   Procedure: Left Heart Cath and Cors/Grafts Angiography;  Surgeon: Wolm JINNY Rhyme, MD;  Location: [facility];  Service: Cardiovascular;  Laterality: Left;   LEFT HEART CATH AND CORS/GRAFTS ANGIOGRAPHY Left 06/09/2023   Procedure: LEFT HEART CATH AND CORS/GRAFTS ANGIOGRAPHY;  Surgeon: Ammon Blunt, MD;  Location: [facility];  Service: Cardiovascular;  Laterality: Left;   LOWER EXTREMITY ANGIOGRAPHY Left 02/23/2024   Procedure: Lower Extremity Angiography;  Surgeon: Jama Cordella MATSU, MD;  Location: [facility];  Service: Cardiovascular;  Laterality: Left;   LOWER EXTREMITY INTERVENTION Left 02/23/2024   Procedure: LOWER EXTREMITY INTERVENTION;  Surgeon: Jama Cordella MATSU, MD;  Location: [facility];  Service: Cardiovascular;  Laterality: Left;   METATARSAL HEAD EXCISION Right  03/21/2020   Procedure: METATARSAL HEAD RESECTION RIGHT AND DEBRIDEMENT OF ULCER;  Surgeon: Janit Thresa HERO, DPM;  Location: [facility];  Service: Podiatry;  Laterality: Right;   METATARSAL HEAD EXCISION Left 09/15/2023   Procedure: METATARSAL HEAD EXCISION;  Surgeon: Janit Thresa HERO, DPM;  Location: [facility];  Service: Orthopedics/Podiatry;  Laterality: Left;   MULTIPLE TOOTH EXTRACTIONS     TEE WITHOUT CARDIOVERSION N/A 06/27/2016   Procedure: TRANSESOPHAGEAL ECHOCARDIOGRAM (TEE);  Surgeon: Maude Fleeta Ochoa, MD;  Location: [facility] OR;  Service: Open Heart Surgery;  Laterality: N/A;   TENDON LENGTHENING Left 12/04/2023   Procedure: TENDON-ACHILLES LENGTHENING;  Surgeon: Janit Thresa HERO, DPM;  Location: [facility];  Service: Orthopedics/Podiatry;  Laterality: Left;   TRANSMETATARSAL AMPUTATION Left 12/04/2023   Procedure: TRANSMETATARSAL AMPUTATION;  Surgeon: Janit Thresa HERO, DPM;  Location: [facility];  Service: Orthopedics/Podiatry;  Laterality: Left;    Social History Social History   Tobacco Use   Smoking status: Former    Current packs/day: 0.00    Average packs/day: 1 pack/day for 16.7 years (16.7 ttl pk-yrs)    Types: Cigarettes    Start date: 11/03/1969    Quit date: 07/22/1986    Years since quitting: 37.8   Smokeless tobacco: Never  Vaping Use   Vaping status: Never Used  Substance Use Topics   Alcohol use: Not Currently   Drug use: No    Family History Family History  Problem Relation Age of Onset   Hypertension Mother    Lupus Mother    Heart Problems Mother    Hypertension Father    Diabetes Father    Heart attack Father 9       died in his 56s   Drug abuse Brother    Heart disease Brother    Heart attack Maternal Grandmother    Arthritis Maternal Grandmother    Arthritis Maternal Grandfather    Heart attack Paternal Grandmother    Arthritis Paternal Grandmother    Arthritis Paternal Grandfather     Allergies  Allergen Reactions   Latex Other (See Comments)  Blister and  burning (47M insulin  pump latex band aid )   Nitroglycerin  Nausea Only and Other (See Comments)    Patches only - headache    Statins Other (See Comments)    BODY PAIN, Pt taking Crestor *      Fluvastatin Other (See Comments)    Muscle pain   Fluvastatin Sodium Other (See Comments)    Myalgias   Percocet [Oxycodone -Acetaminophen ] Itching   Rosuvastatin  Other (See Comments)    Muscle pain, Myalgias   Simvastatin Other (See Comments)    Muscle pain, Myalgias   Lipitor [Atorvastatin] Other (See Comments) and Rash    BODY PAIN   Lisinopril  Other (See Comments) and Nausea Only    Light headed, dizzy Other reaction(s): Dizziness   Zetia [Ezetimibe] Other (See Comments)    Muscle Pain     REVIEW OF SYSTEMS (Negative unless checked)  Constitutional: [] Weight loss  [] Fever  [] Chills Cardiac: [] Chest pain   [] Chest pressure   [] Palpitations   [] Shortness of breath when laying flat   [] Shortness of breath with exertion. Vascular:  [x] Pain in legs with walking   [x] Pain in legs at rest  [] History of DVT   [] Phlebitis   [] Swelling in legs   [] Varicose veins   [x] Non-healing ulcers Pulmonary:   [] Uses home oxygen    [] Productive cough   [] Hemoptysis   [] Wheeze  [] COPD   [] Asthma Neurologic:  [] Dizziness   [] Seizures   [] History of stroke   [] History of TIA  [] Aphasia   [] Vissual changes   [] Weakness or numbness in arm   [] Weakness or numbness in leg Musculoskeletal:   [] Joint swelling   [] Joint pain   [] Low back pain Hematologic:  [] Easy bruising  [] Easy bleeding   [] Hypercoagulable state   [] Anemic Gastrointestinal:  [] Diarrhea   [] Vomiting  [] Gastroesophageal reflux/heartburn   [] Difficulty swallowing. Genitourinary:  [] Chronic kidney disease   [] Difficult urination  [] Frequent urination   [] Blood in urine Skin:  [] Rashes   [] Ulcers  Psychological:  [] History of anxiety   []  History of major depression.  Physical Examination  Vitals:   05/03/24 1017  BP: (!) 129/58  Pulse: 64  Resp:  13  Temp: 98 F (36.7 C)  TempSrc: Oral  SpO2: 97%  Weight: 133.8 kg  Height: 5' 10 (1.778 m)   Body mass index is 42.33 kg/m. Gen: WD/WN, NAD Head: [place]/AT, No temporalis wasting.  Ear/Nose/Throat: Hearing grossly intact, nares w/o erythema or drainage Eyes: PER, EOMI, sclera nonicteric.  Neck: Supple, no masses.  No bruit or JVD.  Pulmonary:  Good air movement, no audible wheezing, no use of accessory muscles.  Cardiac: RRR, normal S1, S2, no Murmurs. Vascular:  mild trophic changes, right foot open wounds Vessel Right Left  Radial Palpable Palpable  PT Not Palpable Not Palpable  DP Not Palpable Not Palpable  Gastrointestinal: soft, non-distended. No guarding/no peritoneal signs.  Musculoskeletal: M/S 5/5 throughout.  No visible deformity.  Neurologic: CN 2-12 intact. Pain and light touch intact in extremities.  Symmetrical.  Speech is fluent. Motor exam as listed above. Psychiatric: Judgment intact, Mood & affect appropriate for pt's clinical situation. Dermatologic: No rashes or ulcers noted.  No changes consistent with cellulitis.   CBC Lab Results  Component Value Date   WBC 7.2 09/09/2023   HGB 12.6 (L) 09/09/2023   HCT 36.9 (L) 09/09/2023   MCV 92.9 09/09/2023   PLT 191 09/09/2023    BMET    Component Value Date/Time   NA 141 11/02/2023 1454  NA 139 02/25/2022 1150   NA 137 10/12/2013 0515   K 4.2 11/02/2023 1454   K 3.9 10/12/2013 0515   CL 101 11/02/2023 1454   CL 101 10/12/2013 0515   CO2 30 11/02/2023 1454   CO2 31 10/12/2013 0515   GLUCOSE 115 (H) 11/02/2023 1454   GLUCOSE 218 (H) 10/12/2013 0515   BUN 28 (H) 02/23/2024 1116   BUN 19 02/25/2022 1150   BUN 20 (H) 10/12/2013 0515   CREATININE 1.09 02/23/2024 1116   CREATININE 0.99 10/12/2013 0515   CALCIUM  9.5 11/02/2023 1454   CALCIUM  8.8 10/12/2013 0515   GFRNONAA >60 02/23/2024 1116   GFRNONAA >60 10/12/2013 0515   GFRAA >60 04/23/2017 0937   GFRAA >60 10/12/2013 0515   CrCl cannot be  calculated (Patient's most recent lab result is older than the maximum 21 days allowed.).  COAG Lab Results  Component Value Date   INR 1.07 04/23/2017   INR 1.05 03/18/2017   INR 1.29 06/27/2016    Radiology DG Shoulder Left Result Date: 05/03/2024 CLINICAL DATA:  Pain and loss of motion. EXAM: LEFT SHOULDER - 2+ VIEW COMPARISON:  None Available. FINDINGS: Mild glenohumeral joint space narrowing and inferior glenoid degenerative spurring. Moderate acromioclavicular joint space narrowing and peripheral osteophytosis. Minimal lateral downsloping of the acromion. Partial visualization of median sternotomy and CABG. No acute fracture or dislocation. IMPRESSION: Mild glenohumeral and moderate acromioclavicular osteoarthritis. Electronically Signed   By: Tanda Lyons M.D.   On: 05/03/2024 10:10     Assessment/Plan 1. Atherosclerosis of native arteries of the extremities with ulceration (HCC) (Primary)  Recommend:   The patient has evidence of severe atherosclerotic changes of both lower extremities associated with ulceration and tissue loss of the right foot.  This represents a limb threatening ischemia and places the patient at the risk for right limb loss.   Patient should undergo angiography of the right lower extremity with the hope for intervention for limb salvage.  The risks and benefits as well as the alternative therapies was discussed in detail with the patient.  All questions were answered.  Patient agrees to proceed with right angiography.   The patient will follow up with me in the office after the procedure.    P29.776    critical limb ischemia of the lower extremity I70.25      Atherosclerotic occlusive disease with ulceration   CPT codes: 62771   angioplasty tibial peroneal artery 36247   introduction catheter below diaphragm third order   2. Bilateral carotid artery stenosis Recommend:   Given the patient's asymptomatic subcritical stenosis no further invasive testing  or surgery at this time.   Duplex ultrasound shows <30% stenosis bilaterally.   Continue antiplatelet therapy as prescribed Continue management of CAD, HTN and Hyperlipidemia Healthy heart diet,  encouraged exercise at least 4 times per week   Follow up in 24 months with duplex ultrasound and physical exam    3. Coronary artery disease involving native coronary artery of native heart with angina pectoris (HCC) Continue cardiac and antihypertensive medications as already ordered and reviewed, no changes at this time.   Continue statin as ordered and reviewed, no changes at this time   Nitrates PRN for chest pain   4. Benign essential HTN Continue antihypertensive medications as already ordered, these medications have been reviewed and there are no changes at this time.   5. Type 1 diabetes mellitus with hyperglycemia (HCC) Continue hypoglycemic medications as already ordered, these medications have been reviewed  and there are no changes at this time.   Hgb A1C to be monitored as already arranged by primary service     Cordella Shawl, MD  05/03/2024 11:07 AM

## 2024-05-03 NOTE — Interval H&P Note (Signed)
 History and Physical Interval Note:  05/03/2024 11:10 AM  Lawrence [name]  has presented today for surgery, with the diagnosis of RLE Angio w intervention   ASO w ulceratilon.  The various methods of treatment have been discussed with the patient and family. After consideration of risks, benefits and other options for treatment, the patient has consented to  Procedure(s): Lower Extremity Angiography (Right) as a surgical intervention.  The patient's history has been reviewed, patient examined, no change in status, stable for surgery.  I have reviewed the patient's chart and labs.  Questions were answered to the patient's satisfaction.     Cordella Shawl

## 2024-05-03 NOTE — Telephone Encounter (Signed)
 Called pt LVM to call to schedule post-op appt.   Follow up with Schnier, Cordella MATSU, MD (Vascular Surgery) in 3 weeks (05/24/2024); follow up after procedure with an ABI.

## 2024-05-03 NOTE — Op Note (Signed)
 [facility]  Percutaneous Study/Intervention Procedural Note   Date of Surgery: 05/03/2024  Surgeon:  Cordella Shawl  Pre-operative Diagnosis: Atherosclerotic occlusive disease bilateral lower extremities with ulcerations of the right lower extremity  Post-operative diagnosis:  Same  Procedure(s) Performed:             1.  Introduction catheter into right lower extremity 3rd order catheter placement with additional third order catheter placement             2.  Contrast injection right lower extremity for distal runoff with additional 3rd order             3.  Percutaneous transluminal angioplasty right anterior tibial to 3 mm                          4.  Percutaneous transluminal angioplasty right posterior tibial to 3 mm             5.  Star close closure left common femoral arteriotomy  Anesthesia: Conscious sedation was administered under my direct supervision by the interventional radiology RN. IV Versed  plus fentanyl  were utilized. Continuous ECG, pulse oximetry and blood pressure was monitored throughout the entire procedure.  Conscious sedation was for a total of 41 minutes.  Sheath: 80 cm 6 [place] Rabie right common femoral retrograde  Contrast: 50 cc  Fluoroscopy Time: 6.2 minutes  Indications:  KAHLEEL FADELEY presents with increasing pain of the right lower extremity.  He has wounds that have not been healing.  This suggests the patient is having limb threatening ischemia.  Angiography with the hope for intervention has been recommended for limb salvage.  The risks and benefits are reviewed all questions answered patient agrees to proceed.  Procedure: ROSHARD REZABEK is a 73 y.o. y.o. male who was identified and appropriate procedural time out was performed.  The patient was then placed supine on the table and prepped and draped in the usual sterile fashion.    Ultrasound was placed in the sterile sleeve and the left groin was evaluated the left  common femoral artery was echolucent and pulsatile indicating patency.  Image was recorded for the permanent record and under real-time visualization a microneedle was inserted into the common femoral artery followed by the microwire and then the micro-sheath.  A J-wire was then advanced through the micro-sheath and a  5 [place] sheath was then inserted over a J-wire. J-wire was then advanced and a 5 French pigtail catheter was positioned at the level of T12.  AP projection of the aorta was then obtained. Pigtail catheter was repositioned to above the bifurcation and a LAO view of the pelvis was obtained.  Subsequently a pigtail catheter with the stiff angle Glidewire was used to cross the aortic bifurcation the catheter wire were advanced down into the right distal external iliac artery. Oblique view of the femoral bifurcation was then obtained and subsequently the wire was reintroduced and the pigtail catheter negotiated into the SFA representing third order catheter placement. Distal runoff was then performed.  6000 units of heparin  was then given and allowed to circulate and a 80 cm 6 [place] Rabie sheath was advanced up and over the bifurcation and positioned in the femoral artery  Straight catheter and stiff angle Glidewire were then negotiated down into the distal popliteal. Catheter was then advanced. Hand injection contrast demonstrated the tibial anatomy in detail.  The anterior tibial artery is selected first and  a 0.014 advantage wire is negotiated across the lesion.  This wire is then switched out for a 0.014 command wire.  A 3 mm x 40 mm ultra score balloon was used to angioplasty the anterior tibial.  The inflation was for 1 minute at 10 atm. Follow-up imaging demonstrated excellent patency with less than 10% residual stenosis and preservation of the distal runoff.  The detector was then repositioned and the distal popliteal was re-imaged.   Using the catheter and the command wire at the posterior  tibial artery is selected in the ostial lesion is crossed.  A 3 mm x 40 mm ultra score balloon is then advanced across the 90% stenosis in the posterior tibial artery.  Inflation is to 10 atm for 1 minute.. Follow-up imaging demonstrated patency with excellent result and less than 10% residual stenosis. Distal runoff was then reassessed and noted to be widely patent.    After review of these images the sheath is pulled into the left external iliac oblique of the common femoral is obtained and a Star close device deployed. There no immediate Complications.  Findings:  The abdominal aorta is opacified with a bolus injection contrast. Renal arteries are single and widely patent. The aorta itself has mild disease but no hemodynamically significant lesions. The common and external iliac arteries are widely patent bilaterally.  The right common femoral is widely patent as is the profunda femoris.  The SFA and popliteal him and straight mild disease but there are no hemodynamically significant lesions..  The trifurcation is patent with the takeoff of the anterior tibial being widely patent but a 80% stenosis is noted in the midportion of the proximal one third distally the artery is free of hemodynamically significant stenosis.  The tibioperoneal trunk is widely patent, the peroneal is widely patent and posterior tibial demonstrates a 90% stenosis in its proximal portion.  Distally at the level of the ankle the posterior tibial appears to occlude and there is no filling of the plantar arteries.  Following angioplasty the anterior tibial now is now widely patent and demonstrates in-line flow with less than 10% residual stenosis posterior tibial proximally.  It is much improved and looks quite nice. Angioplasty of the also yields an excellent result with less than 10% residual stenosis and improved flow down to the ankle.  Summary: Successful recanalization right lower extremity for limb salvage                            Disposition: Patient was taken to the recovery room in stable condition having tolerated the procedure well.  Cordella Kahil Agner 05/03/2024,2:23 PM

## 2024-05-03 NOTE — Progress Notes (Signed)
 MRN : [id number]  Lawrence [name] is a 73 y.o. (26-Apr-1951) male who presents with chief complaint of check circulation.  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  The patient notes the ulcer has been present for multiple weeks and has not been improving.  It is very painful and has had some drainage.  No specific history of trauma noted by the patient.  The patient denies fever or chills.  the patient does have diabetes which has been difficult to control.   Patient notes prior to the ulcer developing the extremities were painful particularly with walking.   Patient notes his left leg is done very well status post angiography with intervention.  At the time of angiography of the left lower extremity he was noted to have primarily tibial disease   The patient denies rest pain or dangling of an extremity off the side of the bed during the night for relief. No prior interventions or surgeries.   No history of back problems or DJD of the lumbar sacral spine.    The patient denies amaurosis fugax or recent TIA symptoms. There are no recent neurological changes noted. The patient denies history of DVT, PE or superficial thrombophlebitis. The patient denies recent episodes of angina or shortness of breath.     Current Meds  Medication Sig   albuterol  (VENTOLIN  HFA) 108 (90 Base) MCG/ACT inhaler Inhale 2 puffs into the lungs every 4 (four) hours as needed for wheezing.   Calcium  Carb-Cholecalciferol  (CALCIUM  + D3 PO) Take 1 tablet by mouth in the morning and at bedtime.   carvedilol (COREG) 12.5 MG tablet Take 12.5 mg by mouth 2 (two) times daily with a meal.   Cholecalciferol  (VITAMIN D ) 50 MCG (2000 UT) tablet Take 2,000 Units by mouth in the morning and at  bedtime.   famotidine  (ACID CONTROLLER) 10 MG tablet Take 10 mg by mouth in the morning.   furosemide  (LASIX ) 40 MG tablet Take 1 tablet (40 mg total) by mouth daily.   insulin  glargine-yfgn (SEMGLEE ) 100 UNIT/ML injection Inject 20 Units into the skin at bedtime.   isosorbide  mononitrate (IMDUR ) 60 MG 24 hr tablet Take 60 mg by mouth in the morning and at bedtime.   loratadine  (CLARITIN ) 10 MG tablet Take 10 mg by mouth in the morning.   potassium chloride  SA (K-DUR,KLOR-CON ) 20 MEQ tablet Take 1 tablet (20 mEq total) by mouth daily.   rosuvastatin  (CRESTOR ) 20 MG tablet Take 1 tablet (20 mg total) by mouth daily at 6 PM.   Travoprost, BAK Free, (TRAVATAN) 0.004 % SOLN ophthalmic solution Place 1 drop into both eyes at bedtime.    vitamin C  (ASCORBIC ACID ) 500 MG tablet Take 500 mg by mouth 2 (two) times daily.     Past Medical History:  Diagnosis Date   Allergies    Anginal pain (HCC)    Bilateral carotid artery disease (HCC)    Breast pain, left 08/29/2022   CAD (coronary  artery disease) 07/2007   a.) s/p NSTEMI 07/2007 - sig multi-vessel CAD not amenable to PCI; b.) LHC 06/11/2016: multi vessel CAD not amenable to PCI --> refer to CVTS; c.) s/p 4v CABG 06/27/2016; d.) LHC 06/09/2023: 75% mLM, 80% o-pLAD, 60% mLAD, 100% p-mLCx, 100% OM1, 95% pRCA, 100% mRCA, 50% m-dLAD. LIMA-LAD and SVG-OM1 patent. CTO SVG-RCA with L-R collaterals - med mgmt   Carpal tunnel syndrome on left    a.) s/p release 04/2016   CKD (chronic kidney disease), stage III (HCC)    COVID-19 12/09/2022   Diabetic peripheral neuropathy (HCC)    Diastolic dysfunction    a.) TTE 06/25/2016: EF 60-654%. no RWMAs, G1DD, mild AoV annular calc; b.) TTE 03/19/2017: EF 50-55%, no RWMAs, mild MR; c.) TTE 07/22/2019: EF 55-60%, midl LVH, no RWMAs, triv MR, mild TR   Dizziness 04/30/2021   Dyspnea    GERD (gastroesophageal reflux disease)    Glaucoma    Headache    History of bilateral cataract extraction    HLD  (hyperlipidemia)    HTN (hypertension)    Long term current use of clopidogrel     Long-term use of aspirin  therapy    MRSA (methicillin resistant staph aureus) culture positive    2006 - 2008   Myocardial infarction (HCC) 04/04/2016   NSTEMI (non-ST elevated myocardial infarction) (HCC) 07/27/2007   a.) LHC 07/30/2007: 30% mLAD, 99% D1, 50% pLCx, 40% mLCx, 70/75/85% dLCx, 50% OM1, 75/80/80 OM2, 50% pRCA, 60% mRCA, 85% dRCA --> not amenable to PCI   OSA on CPAP    Osteoarthritis    PAD (peripheral artery disease) (HCC)    a. 03/2022 Lower Ext Angio: No signif Ao-iliac dzs. R PT diff dzs and occluded above ankle. R Pedal arch intact w/ excellent antegrade flow provided by R AT-->no indication for revasc.   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   a.) LIMA-LAD, SVG-RCA, seq SVG-OM1-OM2   S/P tube myringotomy    Skin cancer, basal cell    Skin ulcer of right great toe, unspecified ulcer stage (HCC)    T2DM (type 2 diabetes mellitus) (HCC)    Wears glasses    Wears partial dentures    top    Past Surgical History:  Procedure Laterality Date   ABDOMINAL AORTOGRAM W/LOWER EXTREMITY N/A 03/26/2022   Procedure: ABDOMINAL AORTOGRAM W/LOWER EXTREMITY;  Surgeon: Darron Deatrice LABOR, MD;  Location: [facility];  Service: Cardiovascular;  Laterality: N/A;   AMPUTATION TOE Bilateral 06/26/2023   Procedure: AMPUTATION TOE MPJ OF THE JOINT,PARTIAL TOE AMPUTUATION;  Surgeon: Janit Thresa HERO, DPM;  Location: [facility];  Service: Orthopedics/Podiatry;  Laterality: Bilateral;   AMPUTATION TOE Right 12/04/2023   Procedure: AMPUTATION TOE INTERPHALANGEAL 3RD;  Surgeon: Janit Thresa HERO, DPM;  Location: [facility];  Service: Orthopedics/Podiatry;  Laterality: Right;   APPENDECTOMY     CARDIAC CATHETERIZATION N/A 06/11/2016   Procedure: Left Heart Cath and Coronary Angiography;  Surgeon: Wolm JINNY Rhyme, MD;  Location: [facility];  Service: Cardiovascular;   Laterality: N/A;   CARPAL TUNNEL RELEASE Left 04/24/2016   Procedure: CARPAL TUNNEL RELEASE;  Surgeon: Ozell Flake, MD;  Location: [facility];  Service: Orthopedics;  Laterality: Left;   CATARACT EXTRACTION W/ INTRAOCULAR LENS  IMPLANT, BILATERAL Bilateral    CATARACT EXTRACTION, BILATERAL     R eye 07/16/12, L eye 08/13/12 - with lens implant in both eyes   CORONARY ARTERY BYPASS GRAFT N/A 06/27/2016  Procedure: CORONARY ARTERY BYPASS GRAFTING times four using left internal mammary artery and right leg saphenous vein;  Surgeon: Maude Fleeta Ochoa, MD;  Location: [facility];  Service: Open Heart Surgery;  Laterality: N/A;   FOREARM FRACTURE SURGERY Left 1961   KNEE ARTHROPLASTY Left 02/11/2016   Procedure: COMPUTER ASSISTED TOTAL KNEE ARTHROPLASTY;  Surgeon: Lynwood SHAUNNA Hue, MD;  Location: [facility];  Service: Orthopedics;  Laterality: Left;   KNEE ARTHROSCOPY Right 2000   LEFT HEART CATH AND CORONARY ANGIOGRAPHY Left 07/30/2007   Procedure: LEFT HEART CATH AND CORONARY ANGIOGRAPHY; Location: [facility]; Surgeon: Wolm Rhyme, MD   LEFT HEART CATH AND CORS/GRAFTS ANGIOGRAPHY Left 01/13/2017   Procedure: Left Heart Cath and Cors/Grafts Angiography;  Surgeon: Wolm JINNY Rhyme, MD;  Location: [facility];  Service: Cardiovascular;  Laterality: Left;   LEFT HEART CATH AND CORS/GRAFTS ANGIOGRAPHY Left 06/09/2023   Procedure: LEFT HEART CATH AND CORS/GRAFTS ANGIOGRAPHY;  Surgeon: Ammon Blunt, MD;  Location: [facility];  Service: Cardiovascular;  Laterality: Left;   LOWER EXTREMITY ANGIOGRAPHY Left 02/23/2024   Procedure: Lower Extremity Angiography;  Surgeon: Jama Cordella MATSU, MD;  Location: [facility];  Service: Cardiovascular;  Laterality: Left;   LOWER EXTREMITY INTERVENTION Left 02/23/2024   Procedure: LOWER EXTREMITY INTERVENTION;  Surgeon: Jama Cordella MATSU, MD;  Location: [facility];  Service: Cardiovascular;  Laterality: Left;   METATARSAL HEAD EXCISION Right  03/21/2020   Procedure: METATARSAL HEAD RESECTION RIGHT AND DEBRIDEMENT OF ULCER;  Surgeon: Janit Thresa HERO, DPM;  Location: [facility];  Service: Podiatry;  Laterality: Right;   METATARSAL HEAD EXCISION Left 09/15/2023   Procedure: METATARSAL HEAD EXCISION;  Surgeon: Janit Thresa HERO, DPM;  Location: [facility];  Service: Orthopedics/Podiatry;  Laterality: Left;   MULTIPLE TOOTH EXTRACTIONS     TEE WITHOUT CARDIOVERSION N/A 06/27/2016   Procedure: TRANSESOPHAGEAL ECHOCARDIOGRAM (TEE);  Surgeon: Maude Fleeta Ochoa, MD;  Location: [facility] OR;  Service: Open Heart Surgery;  Laterality: N/A;   TENDON LENGTHENING Left 12/04/2023   Procedure: TENDON-ACHILLES LENGTHENING;  Surgeon: Janit Thresa HERO, DPM;  Location: [facility];  Service: Orthopedics/Podiatry;  Laterality: Left;   TRANSMETATARSAL AMPUTATION Left 12/04/2023   Procedure: TRANSMETATARSAL AMPUTATION;  Surgeon: Janit Thresa HERO, DPM;  Location: [facility];  Service: Orthopedics/Podiatry;  Laterality: Left;    Social History Social History   Tobacco Use   Smoking status: Former    Current packs/day: 0.00    Average packs/day: 1 pack/day for 16.7 years (16.7 ttl pk-yrs)    Types: Cigarettes    Start date: 11/03/1969    Quit date: 07/22/1986    Years since quitting: 37.8   Smokeless tobacco: Never  Vaping Use   Vaping status: Never Used  Substance Use Topics   Alcohol use: Not Currently   Drug use: No    Family History Family History  Problem Relation Age of Onset   Hypertension Mother    Lupus Mother    Heart Problems Mother    Hypertension Father    Diabetes Father    Heart attack Father 9       died in his 56s   Drug abuse Brother    Heart disease Brother    Heart attack Maternal Grandmother    Arthritis Maternal Grandmother    Arthritis Maternal Grandfather    Heart attack Paternal Grandmother    Arthritis Paternal Grandmother    Arthritis Paternal Grandfather     Allergies  Allergen Reactions   Latex Other (See Comments)  Blister and  burning (96M insulin  pump latex band aid )   Nitroglycerin  Nausea Only and Other (See Comments)    Patches only - headache    Statins Other (See Comments)    BODY PAIN, Pt taking Crestor *      Fluvastatin Other (See Comments)    Muscle pain   Fluvastatin Sodium Other (See Comments)    Myalgias   Percocet [Oxycodone -Acetaminophen ] Itching   Rosuvastatin  Other (See Comments)    Muscle pain, Myalgias   Simvastatin Other (See Comments)    Muscle pain, Myalgias   Lipitor [Atorvastatin] Other (See Comments) and Rash    BODY PAIN   Lisinopril  Other (See Comments) and Nausea Only    Light headed, dizzy Other reaction(s): Dizziness   Zetia [Ezetimibe] Other (See Comments)    Muscle Pain     REVIEW OF SYSTEMS (Negative unless checked)  Constitutional: [] Weight loss  [] Fever  [] Chills Cardiac: [] Chest pain   [] Chest pressure   [] Palpitations   [] Shortness of breath when laying flat   [] Shortness of breath with exertion. Vascular:  [x] Pain in legs with walking   [x] Pain in legs at rest  [] History of DVT   [] Phlebitis   [] Swelling in legs   [] Varicose veins   [x] Non-healing ulcers Pulmonary:   [] Uses home oxygen    [] Productive cough   [] Hemoptysis   [] Wheeze  [] COPD   [] Asthma Neurologic:  [] Dizziness   [] Seizures   [] History of stroke   [] History of TIA  [] Aphasia   [] Vissual changes   [] Weakness or numbness in arm   [] Weakness or numbness in leg Musculoskeletal:   [] Joint swelling   [] Joint pain   [] Low back pain Hematologic:  [] Easy bruising  [] Easy bleeding   [] Hypercoagulable state   [] Anemic Gastrointestinal:  [] Diarrhea   [] Vomiting  [] Gastroesophageal reflux/heartburn   [] Difficulty swallowing. Genitourinary:  [] Chronic kidney disease   [] Difficult urination  [] Frequent urination   [] Blood in urine Skin:  [] Rashes   [] Ulcers  Psychological:  [] History of anxiety   []  History of major depression.  Physical Examination  Vitals:   05/03/24 1017  BP: (!) 129/58  Pulse: 64  Resp:  13  Temp: 98 F (36.7 C)  TempSrc: Oral  SpO2: 97%  Weight: 133.8 kg  Height: 5' 10 (1.778 m)   Body mass index is 42.33 kg/m. Gen: WD/WN, NAD Head: [place]/AT, No temporalis wasting.  Ear/Nose/Throat: Hearing grossly intact, nares w/o erythema or drainage Eyes: PER, EOMI, sclera nonicteric.  Neck: Supple, no masses.  No bruit or JVD.  Pulmonary:  Good air movement, no audible wheezing, no use of accessory muscles.  Cardiac: RRR, normal S1, S2, no Murmurs. Vascular:  mild trophic changes, right foot open wounds Vessel Right Left  Radial Palpable Palpable  PT Not Palpable Not Palpable  DP Not Palpable Not Palpable  Gastrointestinal: soft, non-distended. No guarding/no peritoneal signs.  Musculoskeletal: M/S 5/5 throughout.  No visible deformity.  Neurologic: CN 2-12 intact. Pain and light touch intact in extremities.  Symmetrical.  Speech is fluent. Motor exam as listed above. Psychiatric: Judgment intact, Mood & affect appropriate for pt's clinical situation. Dermatologic: No rashes or ulcers noted.  No changes consistent with cellulitis.   CBC Lab Results  Component Value Date   WBC 7.2 09/09/2023   HGB 12.6 (L) 09/09/2023   HCT 36.9 (L) 09/09/2023   MCV 92.9 09/09/2023   PLT 191 09/09/2023    BMET    Component Value Date/Time   NA 141 11/02/2023 1454  NA 139 02/25/2022 1150   NA 137 10/12/2013 0515   K 4.2 11/02/2023 1454   K 3.9 10/12/2013 0515   CL 101 11/02/2023 1454   CL 101 10/12/2013 0515   CO2 30 11/02/2023 1454   CO2 31 10/12/2013 0515   GLUCOSE 115 (H) 11/02/2023 1454   GLUCOSE 218 (H) 10/12/2013 0515   BUN 28 (H) 02/23/2024 1116   BUN 19 02/25/2022 1150   BUN 20 (H) 10/12/2013 0515   CREATININE 1.09 02/23/2024 1116   CREATININE 0.99 10/12/2013 0515   CALCIUM  9.5 11/02/2023 1454   CALCIUM  8.8 10/12/2013 0515   GFRNONAA >60 02/23/2024 1116   GFRNONAA >60 10/12/2013 0515   GFRAA >60 04/23/2017 0937   GFRAA >60 10/12/2013 0515   CrCl cannot be  calculated (Patient's most recent lab result is older than the maximum 21 days allowed.).  COAG Lab Results  Component Value Date   INR 1.07 04/23/2017   INR 1.05 03/18/2017   INR 1.29 06/27/2016    Radiology DG Shoulder Left Result Date: 05/03/2024 CLINICAL DATA:  Pain and loss of motion. EXAM: LEFT SHOULDER - 2+ VIEW COMPARISON:  None Available. FINDINGS: Mild glenohumeral joint space narrowing and inferior glenoid degenerative spurring. Moderate acromioclavicular joint space narrowing and peripheral osteophytosis. Minimal lateral downsloping of the acromion. Partial visualization of median sternotomy and CABG. No acute fracture or dislocation. IMPRESSION: Mild glenohumeral and moderate acromioclavicular osteoarthritis. Electronically Signed   By: Tanda Lyons M.D.   On: 05/03/2024 10:10     Assessment/Plan 1. Atherosclerosis of native arteries of the extremities with ulceration (HCC) (Primary)  Recommend:   The patient has evidence of severe atherosclerotic changes of both lower extremities associated with ulceration and tissue loss of the right foot.  This represents a limb threatening ischemia and places the patient at the risk for right limb loss.   Patient should undergo angiography of the right lower extremity with the hope for intervention for limb salvage.  The risks and benefits as well as the alternative therapies was discussed in detail with the patient.  All questions were answered.  Patient agrees to proceed with right angiography.   The patient will follow up with me in the office after the procedure.    P29.776    critical limb ischemia of the lower extremity I70.25      Atherosclerotic occlusive disease with ulceration   CPT codes: 62771   angioplasty tibial peroneal artery 36247   introduction catheter below diaphragm third order   2. Bilateral carotid artery stenosis Recommend:   Given the patient's asymptomatic subcritical stenosis no further invasive testing  or surgery at this time.   Duplex ultrasound shows <30% stenosis bilaterally.   Continue antiplatelet therapy as prescribed Continue management of CAD, HTN and Hyperlipidemia Healthy heart diet,  encouraged exercise at least 4 times per week   Follow up in 24 months with duplex ultrasound and physical exam    3. Coronary artery disease involving native coronary artery of native heart with angina pectoris (HCC) Continue cardiac and antihypertensive medications as already ordered and reviewed, no changes at this time.   Continue statin as ordered and reviewed, no changes at this time   Nitrates PRN for chest pain   4. Benign essential HTN Continue antihypertensive medications as already ordered, these medications have been reviewed and there are no changes at this time.   5. Type 1 diabetes mellitus with hyperglycemia (HCC) Continue hypoglycemic medications as already ordered, these medications have been reviewed  and there are no changes at this time.   Hgb A1C to be monitored as already arranged by primary service     Cordella Shawl, MD  05/03/2024 11:07 AM

## 2024-05-04 ENCOUNTER — Encounter: Payer: Self-pay | Admitting: Vascular Surgery

## 2024-05-16 ENCOUNTER — Ambulatory Visit: Attending: Family Medicine | Admitting: Physical Therapy

## 2024-05-16 DIAGNOSIS — G8929 Other chronic pain: Secondary | ICD-10-CM | POA: Diagnosis present

## 2024-05-16 DIAGNOSIS — M25512 Pain in left shoulder: Secondary | ICD-10-CM | POA: Insufficient documentation

## 2024-05-16 DIAGNOSIS — M19012 Primary osteoarthritis, left shoulder: Secondary | ICD-10-CM | POA: Diagnosis not present

## 2024-05-16 DIAGNOSIS — M542 Cervicalgia: Secondary | ICD-10-CM | POA: Diagnosis present

## 2024-05-16 NOTE — Therapy (Addendum)
 OUTPATIENT PHYSICAL THERAPY SHOULDER EVALUATION   Patient Name: Lawrence [name] MRN: [id number] DOB:February 09, 1951, 73 y.o., male Today's Date: 05/16/2024  END OF SESSION:  PT End of Session - 05/16/24 1718     Visit Number 1    Number of Visits 24    Date for PT Re-Evaluation 08/08/24    Authorization Type Medicare 2025    Authorization - Visit Number 1    Authorization - Number of Visits 24    Progress Note Due on Visit 10    PT Start Time 1515    PT Stop Time 1600    PT Time Calculation (min) 45 min    Activity Tolerance Patient tolerated treatment well    Behavior During Therapy WFL for tasks assessed/performed          Past Medical History:  Diagnosis Date   Allergies    Anginal pain (HCC)    Bilateral carotid artery disease (HCC)    Breast pain, left 08/29/2022   CAD (coronary artery disease) 07/2007   a.) s/p NSTEMI 07/2007 - sig multi-vessel CAD not amenable to PCI; b.) LHC 06/11/2016: multi vessel CAD not amenable to PCI --> refer to CVTS; c.) s/p 4v CABG 06/27/2016; d.) LHC 06/09/2023: 75% mLM, 80% o-pLAD, 60% mLAD, 100% p-mLCx, 100% OM1, 95% pRCA, 100% mRCA, 50% m-dLAD. LIMA-LAD and SVG-OM1 patent. CTO SVG-RCA with L-R collaterals - med mgmt   Carpal tunnel syndrome on left    a.) s/p release 04/2016   CKD (chronic kidney disease), stage III (HCC)    COVID-19 12/09/2022   Diabetic peripheral neuropathy (HCC)    Diastolic dysfunction    a.) TTE 06/25/2016: EF 60-654%. no RWMAs, G1DD, mild AoV annular calc; b.) TTE 03/19/2017: EF 50-55%, no RWMAs, mild MR; c.) TTE 07/22/2019: EF 55-60%, midl LVH, no RWMAs, triv MR, mild TR   Dizziness 04/30/2021   Dyspnea    GERD (gastroesophageal reflux disease)    Glaucoma    Headache    History of bilateral cataract extraction    HLD (hyperlipidemia)    HTN (hypertension)    Long term current use of clopidogrel     Long-term use of aspirin  therapy    MRSA (methicillin resistant staph aureus) culture positive    2006 - 2008    Myocardial infarction (HCC) 04/04/2016   NSTEMI (non-ST elevated myocardial infarction) (HCC) 07/27/2007   a.) LHC 07/30/2007: 30% mLAD, 99% D1, 50% pLCx, 40% mLCx, 70/75/85% dLCx, 50% OM1, 75/80/80 OM2, 50% pRCA, 60% mRCA, 85% dRCA --> not amenable to PCI   OSA on CPAP    Osteoarthritis    PAD (peripheral artery disease) (HCC)    a. 03/2022 Lower Ext Angio: No signif Ao-iliac dzs. R PT diff dzs and occluded above ankle. R Pedal arch intact w/ excellent antegrade flow provided by R AT-->no indication for revasc.   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   a.) LIMA-LAD, SVG-RCA, seq SVG-OM1-OM2   S/P tube myringotomy    Skin cancer, basal cell    Skin ulcer of right great toe, unspecified ulcer stage (HCC)    T2DM (type 2 diabetes mellitus) (HCC)    Wears glasses    Wears partial dentures    top   Past Surgical History:  Procedure Laterality Date   ABDOMINAL AORTOGRAM W/LOWER EXTREMITY N/A 03/26/2022   Procedure: ABDOMINAL AORTOGRAM W/LOWER EXTREMITY;  Surgeon: Darron Deatrice LABOR, MD;  Location: [facility];  Service: Cardiovascular;  Laterality:  N/A;   AMPUTATION TOE Bilateral 06/26/2023   Procedure: AMPUTATION TOE MPJ OF THE JOINT,PARTIAL TOE AMPUTUATION;  Surgeon: Janit Thresa HERO, DPM;  Location: [facility];  Service: Orthopedics/Podiatry;  Laterality: Bilateral;   AMPUTATION TOE Right 12/04/2023   Procedure: AMPUTATION TOE INTERPHALANGEAL 3RD;  Surgeon: Janit Thresa HERO, DPM;  Location: [facility];  Service: Orthopedics/Podiatry;  Laterality: Right;   APPENDECTOMY     CARDIAC CATHETERIZATION N/A 06/11/2016   Procedure: Left Heart Cath and Coronary Angiography;  Surgeon: Wolm JINNY Rhyme, MD;  Location: [facility];  Service: Cardiovascular;  Laterality: N/A;   CARPAL TUNNEL RELEASE Left 04/24/2016   Procedure: CARPAL TUNNEL RELEASE;  Surgeon: Ozell Flake, MD;  Location: [facility];  Service: Orthopedics;  Laterality: Left;   CATARACT  EXTRACTION W/ INTRAOCULAR LENS  IMPLANT, BILATERAL Bilateral    CATARACT EXTRACTION, BILATERAL     R eye 07/16/12, L eye 08/13/12 - with lens implant in both eyes   CORONARY ARTERY BYPASS GRAFT N/A 06/27/2016   Procedure: CORONARY ARTERY BYPASS GRAFTING times four using left internal mammary artery and right leg saphenous vein;  Surgeon: Maude Fleeta Ochoa, MD;  Location: [facility];  Service: Open Heart Surgery;  Laterality: N/A;   FOREARM FRACTURE SURGERY Left 1961   KNEE ARTHROPLASTY Left 02/11/2016   Procedure: COMPUTER ASSISTED TOTAL KNEE ARTHROPLASTY;  Surgeon: Lynwood SHAUNNA Hue, MD;  Location: [facility];  Service: Orthopedics;  Laterality: Left;   KNEE ARTHROSCOPY Right 2000   LEFT HEART CATH AND CORONARY ANGIOGRAPHY Left 07/30/2007   Procedure: LEFT HEART CATH AND CORONARY ANGIOGRAPHY; Location: [facility]; Surgeon: Wolm Rhyme, MD   LEFT HEART CATH AND CORS/GRAFTS ANGIOGRAPHY Left 01/13/2017   Procedure: Left Heart Cath and Cors/Grafts Angiography;  Surgeon: Wolm JINNY Rhyme, MD;  Location: [facility];  Service: Cardiovascular;  Laterality: Left;   LEFT HEART CATH AND CORS/GRAFTS ANGIOGRAPHY Left 06/09/2023   Procedure: LEFT HEART CATH AND CORS/GRAFTS ANGIOGRAPHY;  Surgeon: Ammon Blunt, MD;  Location: [facility];  Service: Cardiovascular;  Laterality: Left;   LOWER EXTREMITY ANGIOGRAPHY Left 02/23/2024   Procedure: Lower Extremity Angiography;  Surgeon: Jama Cordella MATSU, MD;  Location: [facility];  Service: Cardiovascular;  Laterality: Left;   LOWER EXTREMITY ANGIOGRAPHY Right 05/03/2024   Procedure: Lower Extremity Angiography;  Surgeon: Jama Cordella MATSU, MD;  Location: [facility];  Service: Cardiovascular;  Laterality: Right;   LOWER EXTREMITY INTERVENTION Left 02/23/2024   Procedure: LOWER EXTREMITY INTERVENTION;  Surgeon: Jama Cordella MATSU, MD;  Location: [facility];  Service: Cardiovascular;  Laterality: Left;   METATARSAL HEAD EXCISION  Right 03/21/2020   Procedure: METATARSAL HEAD RESECTION RIGHT AND DEBRIDEMENT OF ULCER;  Surgeon: Janit Thresa HERO, DPM;  Location: [facility];  Service: Podiatry;  Laterality: Right;   METATARSAL HEAD EXCISION Left 09/15/2023   Procedure: METATARSAL HEAD EXCISION;  Surgeon: Janit Thresa HERO, DPM;  Location: [facility];  Service: Orthopedics/Podiatry;  Laterality: Left;   MULTIPLE TOOTH EXTRACTIONS     TEE WITHOUT CARDIOVERSION N/A 06/27/2016   Procedure: TRANSESOPHAGEAL ECHOCARDIOGRAM (TEE);  Surgeon: Maude Fleeta Ochoa, MD;  Location: [facility];  Service: Open Heart Surgery;  Laterality: N/A;   TENDON LENGTHENING Left 12/04/2023   Procedure: TENDON-ACHILLES LENGTHENING;  Surgeon: Janit Thresa HERO, DPM;  Location: [facility];  Service: Orthopedics/Podiatry;  Laterality: Left;   TRANSMETATARSAL AMPUTATION Left 12/04/2023   Procedure: TRANSMETATARSAL AMPUTATION;  Surgeon: Janit Thresa HERO, DPM;  Location: [facility];  Service: Orthopedics/Podiatry;  Laterality: Left;  Patient Active Problem List   Diagnosis Date Noted   Atherosclerosis of native arteries of the extremities with ulceration (HCC) 01/27/2024   Pain in joint, lower leg 09/05/2022   Left-sided chest wall pain 08/29/2022   Stage 3a chronic kidney disease (HCC) 12/26/2021   Chest tightness 03/22/2021   History of MI (myocardial infarction) 07/11/2020   Ulcer of left foot with fat layer exposed (HCC) 03/08/2020   Actinic keratosis 02/26/2020   Allergic rhinitis 02/26/2020   Basal cell carcinoma of skin 02/26/2020   ED (erectile dysfunction) of organic origin 02/26/2020   Hypersomnia with sleep apnea 02/26/2020   Low back pain 02/26/2020   Other specified malignant neoplasm of skin, site unspecified 02/26/2020   Other anxiety states 02/26/2020   Psychosexual dysfunction with inhibited sexual excitement 02/26/2020   Senile cataract 02/26/2020   Medicare annual wellness visit, subsequent 04/26/2019   Frequent headaches 11/04/2018   Osteoarthritis  09/29/2017   Stable angina (HCC) 04/22/2017   Bilateral carotid artery stenosis 12/10/2016   S/P CABG (coronary artery bypass graft) 06/27/2016   Type 1 diabetes mellitus with hyperglycemia (HCC) 04/04/2016   Combined fat and carbohydrate induced hyperlipemia 04/04/2016   H/O total knee replacement 03/28/2016   S/P total knee arthroplasty 02/11/2016   Arthritis of knee, degenerative 11/05/2015   Hyperlipidemia 08/28/2015   CAD (coronary artery disease) 08/28/2015   Lower extremity edema 08/28/2015   GERD (gastroesophageal reflux disease) 08/28/2015   OSA (obstructive sleep apnea) 08/28/2015   Morbid obesity with BMI of 40.0-44.9, adult (HCC) 08/28/2015   Benign essential HTN 05/18/2015    PCP:  Comer Gaskins NP   REFERRING PROVIDER: Dr. Jacques Mallet   REFERRING DIAG:   M19.012 (ICD-10-CM) - Glenohumeral arthritis, left M25.512,G89.29 (ICD-10-CM) - Chronic left shoulder pain  THERAPY DIAG:  Chronic left shoulder pain  Rationale for Evaluation and Treatment: Rehabilitation  ONSET DATE: 03/03/24    SUBJECTIVE:                                                                                                                                                                                      SUBJECTIVE STATEMENT: See pertinent history   Hand dominance: Right  PERTINENT HISTORY: Pt reports experiencing a sharp pain in shoulder blade and around posterior deltoid especially when laying flat on his back or laying down on his incline pillow. This pain started within the past 30 to 60 days. He does not experience this shoulder pain when sitting up. He has tried using an a pillow on top of his wedge and this provided additional relief to the point where he was able to sleep in bed for first time in  a while last night. 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 He describes his family having a history of arthritis including  rheumatoid arthritis.   PAIN:  Are you having pain? Yes: NPRS scale: 7-8/10   Pain location: Left tricep and left periscapular musculature   Pain description: Sharp and shooting   Aggravating factors: Laying flat on his back   Relieving factors: Tylenol  and Voltaren.    PRECAUTIONS: None  RED FLAGS: Cervical red flags: none reported but will follow up in more detail next session.   WEIGHT BEARING RESTRICTIONS: No  FALLS:  Has patient fallen in last 6 months? No; he is a afraid of falling though   LIVING ENVIRONMENT: Lives with: lives with their spouse Lives in: House/apartment Stairs: Yes: External: 7 steps back entrance, and  front 3 steps; on right going up and on left going up Has following equipment at home: Single point cane and walker    OCCUPATION: Retired     PLOF: Independent  PATIENT GOALS:To be able to fall asleep better without being kept awake by left shoulder pain.   NEXT MD VISIT: Did not ask   OBJECTIVE:  Note: Objective measures were completed at Evaluation unless otherwise noted.  VITALS: BP 123/57 HR 66 SpO2 98%  DIAGNOSTIC FINDINGS:  CLINICAL DATA:  Pain and loss of motion.   EXAM: LEFT SHOULDER - 2+ VIEW   COMPARISON:  None Available.   FINDINGS: Mild glenohumeral joint space narrowing and inferior glenoid degenerative spurring. Moderate acromioclavicular joint space narrowing and peripheral osteophytosis. Minimal lateral downsloping of the acromion. Partial visualization of median sternotomy and CABG. No acute fracture or dislocation.   IMPRESSION: Mild glenohumeral and moderate acromioclavicular osteoarthritis.     Electronically Signed   By: Tanda Lyons M.D.   On: 05/03/2024 10:10  PATIENT SURVEYS:  Neck Disability Index: NT    COGNITION: Overall cognitive status: Within functional limits for tasks assessed     SENSATION: WFL  POSTURE: Forward head and rounded shoulders      CERVICAL A/PROM            Flex 35             Ext 20*- pt reports concordant symptoms with pain radiating down left shoulder              SB R/L 25/25              Rot R/L  40/40    UPPER EXTREMITY ROM:   Active ROM Right eval Left eval  Shoulder flexion 160 160  Shoulder extension    Shoulder abduction 160 160   Shoulder adduction    Shoulder internal rotation    Shoulder external rotation    Elbow flexion    Elbow extension    Wrist flexion    Wrist extension    Wrist ulnar deviation    Wrist radial deviation    Wrist pronation    Wrist supination    (Blank rows = not tested)  Combined Shoulder ER                  Occiput          Occiput   Combined Shoulder IR                     PSIS             PSIS  UPPER EXTREMITY MMT:  MMT Right eval Left eval  Shoulder flexion 4 4  Shoulder extension    Shoulder abduction 4 4  Shoulder adduction    Shoulder internal rotation at 0 deg  4 4  Shoulder external rotation at 0 deg  4 4  Middle trapezius    Lower trapezius    Elbow flexion    Elbow extension    Wrist flexion    Wrist extension    Wrist ulnar deviation    Wrist radial deviation    Wrist pronation    Wrist supination    Grip strength (lbs)    (Blank rows = not tested)  SHOULDER SPECIAL TESTS: Impingement tests: Painful arc test: negative Rotator cuff assessment: Full can test: negative and Infraspinatus test: negative    PALPATION:  Left tricep and rhomboid  TTP                                                                                                                              TREATMENT DATE:   05/16/24 Upper Trap Stretch to left 2 x 60 sec   Seated Rows with shoulder blade squeeze 1 x 10  -Pt states exercise brings on symptoms Seated Rows with yellow band 1 x 10   PATIENT EDUCATION: Education details: Form and technique for correct performance of exercise and explanation about underlying cervical pathology   Person educated: Patient Education method: Explanation,  Demonstration, Verbal cues, and Handouts Education comprehension: verbalized understanding, returned demonstration, verbal cues required, and tactile cues required  HOME EXERCISE PROGRAM: Access Code: [email or web address] URL: [email or web address] Date: 05/16/2024 Prepared by: Toribio Servant  Exercises - Seated Upper Trapezius Stretch  - 1 x daily - 7 x weekly - 3 reps - 60 sec  hold - Seated Shoulder Row with Anchored Resistance  - 3-4 x weekly - 3 sets - 10 reps  ASSESSMENT:  CLINICAL IMPRESSION: Patient is a 73 y.o. white male who was seen today for physical therapy evaluation and treatment for left shoulder pain. Shoulder pathology ruled out with no pain elicited with any shoulder motions or resisted movements. He shows signs and symptoms of left sided cervical stenosis with concordant pain elicited with cervical extension with radiating symptoms into left anterior shoulder and shoulder blade. Pain is also relieved when patient is in cervical flexion. This is further validated with pt's subjective report of increased pain when laying on incline and that he experienced some relief when pillow was propped underneath him. His deficits include increased cervical and shoulder pain and decreased cervical ROM along with decreased periscapular strength and poor posture with forward head and rounded shoulders. He will benefit from skilled PT to address these aforementioned deficits to return to sleeping without pain and discomfort and without pain and discomfort keeping him from sleeping.   OBJECTIVE IMPAIRMENTS: decreased knowledge of condition, decreased ROM, decreased strength, hypomobility, impaired UE functional use, postural dysfunction, obesity, and pain.   ACTIVITY LIMITATIONS: carrying,  lifting, sleeping, bathing, dressing, reach over head, and hygiene/grooming  PARTICIPATION LIMITATIONS: cleaning, laundry, interpersonal relationship, driving, shopping, community activity, and yard  work  PERSONAL FACTORS: Age, Fitness, and 3+ comorbidities: T2DM, are also affecting patient's functional outcome.   REHAB POTENTIAL: Good  CLINICAL DECISION MAKING: Stable/uncomplicated  EVALUATION COMPLEXITY: Low   GOALS: Goals reviewed with patient? No  SHORT TERM GOALS: Target date: 05/30/2024  Patient will demonstrate undestanding of home exercise plan by performing exercises correctly with evidence of good carry over with min to no verbal or tactile cues .   Baseline: NT   Goal status: INITIAL  2.  Patient will develop night time cervical spine position to avoid cervical extension like correct positioning through purchasing cervical pillow or using his own equipment and demonstrating this understanding.  Baseline: NT   Goal status: INITIAL   LONG TERM GOALS: Target date: 08/08/2024  Patient will show a statistically significant improvement in her neck function as evidence by >=7.5 pt decrease in her neck disability index score to better be able to move neck to improve visual field while driving to avoid accidents. (Young et al, 2009)                 Baseline: NT                   Goal status: INITIAL  2.  Patient will increase cervical AROM to be near or within normal range of motion for improved cervical function and pain response to movement to have improved sleep.  Baseline: Cervical Ext 20, Cervical Flex 35, Cervical SB R/L  Goal status: INITIAL  3.  Patient will improve periscapular strength by 1/3 grade MMT (ie 4- to 4) for improved cervical stability and improved posture with decreased forward head and rounded shoulders. Baseline: NT  Goal status: INITIAL  4.  Patient will be able to sleep without experiencing left sided cervical pain >=3/10 NRPS as evidence of improved cervical function and to improve sleep quality.  Baseline: 7-8/10 NRPS  Goal status: INITIAL    PLAN:  PT FREQUENCY: 1-2x/week  PT DURATION: 12 weeks  PLANNED INTERVENTIONS: 97164- PT  Re-evaluation, 97750- Physical Performance Testing, 97110-Therapeutic exercises, 97530- Therapeutic activity, 97112- Neuromuscular re-education, 97535- Self Care, 02859- Manual therapy, [phone]- Aquatic Therapy, [id number]- Electrical stimulation (unattended), [phone]- Electrical stimulation (manual), 20560 (1-2 muscles), 20561 (3+ muscles)- Dry Needling, Patient/Family education, Joint mobilization, Joint manipulation, Spinal manipulation, Spinal mobilization, Scar mobilization, Visual/preceptual remediation/compensation, Cryotherapy, and Moist heat  PLAN FOR NEXT SESSION: Take NDI. Cervical ROM, Test Periscapular strength. Chin Tucks. Check in about red flags.   Toribio Servant PT, DPT  [facility] [address] [place], [address] Phone: [phone]   Fax:  [phone]

## 2024-05-17 ENCOUNTER — Encounter: Payer: Self-pay | Admitting: Podiatry

## 2024-05-17 ENCOUNTER — Ambulatory Visit (INDEPENDENT_AMBULATORY_CARE_PROVIDER_SITE_OTHER): Admitting: Podiatry

## 2024-05-17 ENCOUNTER — Ambulatory Visit (INDEPENDENT_AMBULATORY_CARE_PROVIDER_SITE_OTHER)

## 2024-05-17 DIAGNOSIS — L97522 Non-pressure chronic ulcer of other part of left foot with fat layer exposed: Secondary | ICD-10-CM

## 2024-05-17 DIAGNOSIS — M86472 Chronic osteomyelitis with draining sinus, left ankle and foot: Secondary | ICD-10-CM

## 2024-05-17 NOTE — Progress Notes (Signed)
 Chief Complaint  Patient presents with   Wound Check    Pt is here to f/u on right foot due to diabetic ulcer.    Subjective:  Patient presents today status post TMA with TAL left.  Partial toe amputation RT third.  DOS: 12/04/2023.  Follow-up evaluation of ulcer to the distal tip of the right third toe partial amputation site.  Amniotic tissue allograft was applied last visit.  Dressings have been clean dry and intact since application  Also presenting for follow-up evaluation of a draining sinus ulcer to the plantar aspect of the left foot transmetatarsal amputation site.  Past Medical History:  Diagnosis Date   Allergies    Anginal pain (HCC)    Bilateral carotid artery disease (HCC)    Breast pain, left 08/29/2022   CAD (coronary artery disease) 07/2007   a.) s/p NSTEMI 07/2007 - sig multi-vessel CAD not amenable to PCI; b.) LHC 06/11/2016: multi vessel CAD not amenable to PCI --> refer to CVTS; c.) s/p 4v CABG 06/27/2016; d.) LHC 06/09/2023: 75% mLM, 80% o-pLAD, 60% mLAD, 100% p-mLCx, 100% OM1, 95% pRCA, 100% mRCA, 50% m-dLAD. LIMA-LAD and SVG-OM1 patent. CTO SVG-RCA with L-R collaterals - med mgmt   Carpal tunnel syndrome on left    a.) s/p release 04/2016   CKD (chronic kidney disease), stage III (HCC)    COVID-19 12/09/2022   Diabetic peripheral neuropathy (HCC)    Diastolic dysfunction    a.) TTE 06/25/2016: EF 60-654%. no RWMAs, G1DD, mild AoV annular calc; b.) TTE 03/19/2017: EF 50-55%, no RWMAs, mild MR; c.) TTE 07/22/2019: EF 55-60%, midl LVH, no RWMAs, triv MR, mild TR   Dizziness 04/30/2021   Dyspnea    GERD (gastroesophageal reflux disease)    Glaucoma    Headache    History of bilateral cataract extraction    HLD (hyperlipidemia)    HTN (hypertension)    Long term current use of clopidogrel     Long-term use of aspirin  therapy    MRSA (methicillin resistant staph aureus) culture positive    2006 - 2008   Myocardial infarction (HCC) 04/04/2016   NSTEMI (non-ST  elevated myocardial infarction) (HCC) 07/27/2007   a.) LHC 07/30/2007: 30% mLAD, 99% D1, 50% pLCx, 40% mLCx, 70/75/85% dLCx, 50% OM1, 75/80/80 OM2, 50% pRCA, 60% mRCA, 85% dRCA --> not amenable to PCI   OSA on CPAP    Osteoarthritis    PAD (peripheral artery disease) (HCC)    a. 03/2022 Lower Ext Angio: No signif Ao-iliac dzs. R PT diff dzs and occluded above ankle. R Pedal arch intact w/ excellent antegrade flow provided by R AT-->no indication for revasc.   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   a.) LIMA-LAD, SVG-RCA, seq SVG-OM1-OM2   S/P tube myringotomy    Skin cancer, basal cell    Skin ulcer of right great toe, unspecified ulcer stage (HCC)    T2DM (type 2 diabetes mellitus) (HCC)    Wears glasses    Wears partial dentures    top    Past Surgical History:  Procedure Laterality Date   ABDOMINAL AORTOGRAM W/LOWER EXTREMITY N/A 03/26/2022   Procedure: ABDOMINAL AORTOGRAM W/LOWER EXTREMITY;  Surgeon: Darron Deatrice LABOR, MD;  Location: [facility];  Service: Cardiovascular;  Laterality: N/A;   AMPUTATION TOE Bilateral 06/26/2023   Procedure: AMPUTATION TOE MPJ OF THE JOINT,PARTIAL TOE AMPUTUATION;  Surgeon: Janit Thresa HERO, DPM;  Location: [facility];  Service: Orthopedics/Podiatry;  Laterality: Bilateral;   AMPUTATION TOE Right 12/04/2023   Procedure: AMPUTATION TOE INTERPHALANGEAL 3RD;  Surgeon: Janit Thresa HERO, DPM;  Location: [facility];  Service: Orthopedics/Podiatry;  Laterality: Right;   APPENDECTOMY     CARDIAC CATHETERIZATION N/A 06/11/2016   Procedure: Left Heart Cath and Coronary Angiography;  Surgeon: Wolm JINNY Rhyme, MD;  Location: [facility];  Service: Cardiovascular;  Laterality: N/A;   CARPAL TUNNEL RELEASE Left 04/24/2016   Procedure: CARPAL TUNNEL RELEASE;  Surgeon: Ozell Flake, MD;  Location: [facility];  Service: Orthopedics;  Laterality: Left;   CATARACT EXTRACTION W/ INTRAOCULAR LENS  IMPLANT, BILATERAL Bilateral     CATARACT EXTRACTION, BILATERAL     R eye 07/16/12, L eye 08/13/12 - with lens implant in both eyes   CORONARY ARTERY BYPASS GRAFT N/A 06/27/2016   Procedure: CORONARY ARTERY BYPASS GRAFTING times four using left internal mammary artery and right leg saphenous vein;  Surgeon: Maude Fleeta Ochoa, MD;  Location: [facility] OR;  Service: Open Heart Surgery;  Laterality: N/A;   FOREARM FRACTURE SURGERY Left 1961   KNEE ARTHROPLASTY Left 02/11/2016   Procedure: COMPUTER ASSISTED TOTAL KNEE ARTHROPLASTY;  Surgeon: Lynwood SHAUNNA Hue, MD;  Location: [facility];  Service: Orthopedics;  Laterality: Left;   KNEE ARTHROSCOPY Right 2000   LEFT HEART CATH AND CORONARY ANGIOGRAPHY Left 07/30/2007   Procedure: LEFT HEART CATH AND CORONARY ANGIOGRAPHY; Location: [facility]; Surgeon: Wolm Rhyme, MD   LEFT HEART CATH AND CORS/GRAFTS ANGIOGRAPHY Left 01/13/2017   Procedure: Left Heart Cath and Cors/Grafts Angiography;  Surgeon: Wolm JINNY Rhyme, MD;  Location: [facility];  Service: Cardiovascular;  Laterality: Left;   LEFT HEART CATH AND CORS/GRAFTS ANGIOGRAPHY Left 06/09/2023   Procedure: LEFT HEART CATH AND CORS/GRAFTS ANGIOGRAPHY;  Surgeon: Ammon Blunt, MD;  Location: [facility];  Service: Cardiovascular;  Laterality: Left;   LOWER EXTREMITY ANGIOGRAPHY Left 02/23/2024   Procedure: Lower Extremity Angiography;  Surgeon: Jama Cordella MATSU, MD;  Location: [facility];  Service: Cardiovascular;  Laterality: Left;   LOWER EXTREMITY ANGIOGRAPHY Right 05/03/2024   Procedure: Lower Extremity Angiography;  Surgeon: Jama Cordella MATSU, MD;  Location: [facility];  Service: Cardiovascular;  Laterality: Right;   LOWER EXTREMITY INTERVENTION Left 02/23/2024   Procedure: LOWER EXTREMITY INTERVENTION;  Surgeon: Jama Cordella MATSU, MD;  Location: [facility];  Service: Cardiovascular;  Laterality: Left;   METATARSAL HEAD EXCISION Right 03/21/2020   Procedure: METATARSAL HEAD RESECTION RIGHT AND  DEBRIDEMENT OF ULCER;  Surgeon: Janit Thresa HERO, DPM;  Location: [facility];  Service: Podiatry;  Laterality: Right;   METATARSAL HEAD EXCISION Left 09/15/2023   Procedure: METATARSAL HEAD EXCISION;  Surgeon: Janit Thresa HERO, DPM;  Location: [facility];  Service: Orthopedics/Podiatry;  Laterality: Left;   MULTIPLE TOOTH EXTRACTIONS     TEE WITHOUT CARDIOVERSION N/A 06/27/2016   Procedure: TRANSESOPHAGEAL ECHOCARDIOGRAM (TEE);  Surgeon: Maude Fleeta Ochoa, MD;  Location: [facility] OR;  Service: Open Heart Surgery;  Laterality: N/A;   TENDON LENGTHENING Left 12/04/2023   Procedure: TENDON-ACHILLES LENGTHENING;  Surgeon: Janit Thresa HERO, DPM;  Location: [facility];  Service: Orthopedics/Podiatry;  Laterality: Left;   TRANSMETATARSAL AMPUTATION Left 12/04/2023   Procedure: TRANSMETATARSAL AMPUTATION;  Surgeon: Janit Thresa HERO, DPM;  Location: [facility];  Service: Orthopedics/Podiatry;  Laterality: Left;    Allergies  Allergen Reactions   Latex Other (See Comments)    Blister and burning (84M insulin  pump latex band aid )   Nitroglycerin  Nausea Only and Other (  See Comments)    Patches only - headache    Statins Other (See Comments)    BODY PAIN, Pt taking Crestor *      Fluvastatin Other (See Comments)    Muscle pain   Fluvastatin Sodium Other (See Comments)    Myalgias   Percocet [Oxycodone -Acetaminophen ] Itching   Rosuvastatin  Other (See Comments)    Muscle pain, Myalgias   Simvastatin Other (See Comments)    Muscle pain, Myalgias   Lipitor [Atorvastatin] Other (See Comments) and Rash    BODY PAIN   Lisinopril  Other (See Comments) and Nausea Only    Light headed, dizzy Other reaction(s): Dizziness   Zetia [Ezetimibe] Other (See Comments)    Muscle Pain    LT foot 03/01/2024   RT third toe 03/22/2024   RT third toe 04/29/2024   RT third toes 05/17/2024   LT foot 05/17/2024  Objective/Physical Exam Wound noted to the amputation stump of the right third digit.  Continued improvement with the graft  application.  Currently measures approximately 1.2 x 1.4 x 0.1 cm.  Granular wound base.  No exposed bone.  Superficial callus tissue noted overlying the focal wound to the plantar aspect of the left foot amputation stump.  After debridement there was a heavy amount of clear serous 'straw colored' fluid that was drained approximately 2-3 mL total.  No purulence.  No malodor.  No surrounding erythema.  Vascular: Status post angiogram with intervention on 02/23/2024.  Dr. Cordella Shawl [facility] Procedure:  Percutaneous transluminal angioplasty and stent placement within [place] 2.5 mm stent left anterior tibial artery              2.    Percutaneous transluminal angioplasty to 3 mm left posterior tibial artery and tibioperoneal trunk  Radiographic exam LT foot 05/17/2024: Osteotomies across the metatarsals noted.  Radiolucency around the distal aspect of the first metatarsal osteotomy site.  This is likely the area of serous fluid buildup.  It does not seem to track proximal and is not consistent with gas gangrene clinically or radiographically  Assessment: 1. s/p TMA w/ TAL LT. Partial 3rd toe amputation RT. DOS: 12/04/2023 2.  Ulcer right third toe fat layer exposed secondary to diabetes mellitus w/ peripheral polyneuropathy 3.  History of PVD bilateral lower extremities 4.  Draining sinus/ulcer plantar aspect of the left foot amputation stump; stable   Plan of Care:  -Patient was evaluated.  - Medically necessary excisional debridement including subcutaneous tissue was performed today to the right third toe amputation stump.  Excisional debridement of the necrotic nonviable tissue down to healthier bleeding viable tissue was performed with postdebridement measurement symptoms..  Amniotic tissue allograft was not available today for application although it does demonstrate significant improvement and reduction in size of the toe wound to the right foot -Debridement of the overlying callus to  the plantar aspect of the left foot was performed today and heavy serous straw-colored drainage was expressed from the area.  Cultures were taken and sent to pathology for culture and sensitivity. -Order placed for MRI LT foot wo contrast.  The foot appears very stable for the moment -Follow-up after MRI to review results and discuss further treatment options which may include bone biopsy versus incision and drainage or revision of the transmetatarsal amputation    Thresa EMERSON Sar, DPM [facility]  Dr. Thresa EMERSON Sar, DPM    [address]  [place], [place] [address]                Office [phone]  Fax [phone]

## 2024-05-18 ENCOUNTER — Other Ambulatory Visit: Payer: Self-pay | Admitting: Podiatry

## 2024-05-18 ENCOUNTER — Other Ambulatory Visit (INDEPENDENT_AMBULATORY_CARE_PROVIDER_SITE_OTHER): Payer: Self-pay | Admitting: Vascular Surgery

## 2024-05-18 DIAGNOSIS — Z9889 Other specified postprocedural states: Secondary | ICD-10-CM

## 2024-05-18 NOTE — Addendum Note (Signed)
 Addended by: NICHOLAUS MARJORIE SAILOR on: 05/18/2024 08:32 AM   Modules accepted: Orders

## 2024-05-19 ENCOUNTER — Ambulatory Visit: Admitting: Physical Therapy

## 2024-05-19 DIAGNOSIS — M25512 Pain in left shoulder: Secondary | ICD-10-CM | POA: Diagnosis not present

## 2024-05-19 DIAGNOSIS — M542 Cervicalgia: Secondary | ICD-10-CM

## 2024-05-19 NOTE — Therapy (Signed)
 OUTPATIENT PHYSICAL THERAPY SHOULDER TREATMENT     Patient Name: Lawrence [name] MRN: [id number] DOB:1951/06/05, 73 y.o., male Today's Date: 05/19/2024  END OF SESSION:  PT End of Session - 05/19/24 1351     Visit Number 2    Number of Visits 24    Date for PT Re-Evaluation 08/08/24    Authorization Type Medicare 2025    Authorization - Visit Number 2    Authorization - Number of Visits 24    Progress Note Due on Visit 10    PT Start Time 1350    PT Stop Time 1430    PT Time Calculation (min) 40 min    Activity Tolerance Patient tolerated treatment well    Behavior During Therapy WFL for tasks assessed/performed          Past Medical History:  Diagnosis Date   Allergies    Anginal pain (HCC)    Bilateral carotid artery disease (HCC)    Breast pain, left 08/29/2022   CAD (coronary artery disease) 07/2007   a.) s/p NSTEMI 07/2007 - sig multi-vessel CAD not amenable to PCI; b.) LHC 06/11/2016: multi vessel CAD not amenable to PCI --> refer to CVTS; c.) s/p 4v CABG 06/27/2016; d.) LHC 06/09/2023: 75% mLM, 80% o-pLAD, 60% mLAD, 100% p-mLCx, 100% OM1, 95% pRCA, 100% mRCA, 50% m-dLAD. LIMA-LAD and SVG-OM1 patent. CTO SVG-RCA with L-R collaterals - med mgmt   Carpal tunnel syndrome on left    a.) s/p release 04/2016   CKD (chronic kidney disease), stage III (HCC)    COVID-19 12/09/2022   Diabetic peripheral neuropathy (HCC)    Diastolic dysfunction    a.) TTE 06/25/2016: EF 60-654%. no RWMAs, G1DD, mild AoV annular calc; b.) TTE 03/19/2017: EF 50-55%, no RWMAs, mild MR; c.) TTE 07/22/2019: EF 55-60%, midl LVH, no RWMAs, triv MR, mild TR   Dizziness 04/30/2021   Dyspnea    GERD (gastroesophageal reflux disease)    Glaucoma    Headache    History of bilateral cataract extraction    HLD (hyperlipidemia)    HTN (hypertension)    Long term current use of clopidogrel     Long-term use of aspirin  therapy    MRSA (methicillin resistant staph aureus) culture positive    2006 - 2008    Myocardial infarction (HCC) 04/04/2016   NSTEMI (non-ST elevated myocardial infarction) (HCC) 07/27/2007   a.) LHC 07/30/2007: 30% mLAD, 99% D1, 50% pLCx, 40% mLCx, 70/75/85% dLCx, 50% OM1, 75/80/80 OM2, 50% pRCA, 60% mRCA, 85% dRCA --> not amenable to PCI   OSA on CPAP    Osteoarthritis    PAD (peripheral artery disease) (HCC)    a. 03/2022 Lower Ext Angio: No signif Ao-iliac dzs. R PT diff dzs and occluded above ankle. R Pedal arch intact w/ excellent antegrade flow provided by R AT-->no indication for revasc.   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   a.) LIMA-LAD, SVG-RCA, seq SVG-OM1-OM2   S/P tube myringotomy    Skin cancer, basal cell    Skin ulcer of right great toe, unspecified ulcer stage (HCC)    T2DM (type 2 diabetes mellitus) (HCC)    Wears glasses    Wears partial dentures    top   Past Surgical History:  Procedure Laterality Date   ABDOMINAL AORTOGRAM W/LOWER EXTREMITY N/A 03/26/2022   Procedure: ABDOMINAL AORTOGRAM W/LOWER EXTREMITY;  Surgeon: Darron Deatrice LABOR, MD;  Location: [facility];  Service: Cardiovascular;  Laterality: N/A;   AMPUTATION TOE Bilateral 06/26/2023   Procedure: AMPUTATION TOE MPJ OF THE JOINT,PARTIAL TOE AMPUTUATION;  Surgeon: Janit Thresa HERO, DPM;  Location: [facility];  Service: Orthopedics/Podiatry;  Laterality: Bilateral;   AMPUTATION TOE Right 12/04/2023   Procedure: AMPUTATION TOE INTERPHALANGEAL 3RD;  Surgeon: Janit Thresa HERO, DPM;  Location: [facility];  Service: Orthopedics/Podiatry;  Laterality: Right;   APPENDECTOMY     CARDIAC CATHETERIZATION N/A 06/11/2016   Procedure: Left Heart Cath and Coronary Angiography;  Surgeon: Wolm JINNY Rhyme, MD;  Location: [facility];  Service: Cardiovascular;  Laterality: N/A;   CARPAL TUNNEL RELEASE Left 04/24/2016   Procedure: CARPAL TUNNEL RELEASE;  Surgeon: Ozell Flake, MD;  Location: [facility];  Service: Orthopedics;  Laterality: Left;   CATARACT  EXTRACTION W/ INTRAOCULAR LENS  IMPLANT, BILATERAL Bilateral    CATARACT EXTRACTION, BILATERAL     R eye 07/16/12, L eye 08/13/12 - with lens implant in both eyes   CORONARY ARTERY BYPASS GRAFT N/A 06/27/2016   Procedure: CORONARY ARTERY BYPASS GRAFTING times four using left internal mammary artery and right leg saphenous vein;  Surgeon: Maude Fleeta Ochoa, MD;  Location: [facility] OR;  Service: Open Heart Surgery;  Laterality: N/A;   FOREARM FRACTURE SURGERY Left 1961   KNEE ARTHROPLASTY Left 02/11/2016   Procedure: COMPUTER ASSISTED TOTAL KNEE ARTHROPLASTY;  Surgeon: Lynwood SHAUNNA Hue, MD;  Location: [facility];  Service: Orthopedics;  Laterality: Left;   KNEE ARTHROSCOPY Right 2000   LEFT HEART CATH AND CORONARY ANGIOGRAPHY Left 07/30/2007   Procedure: LEFT HEART CATH AND CORONARY ANGIOGRAPHY; Location: [facility]; Surgeon: Wolm Rhyme, MD   LEFT HEART CATH AND CORS/GRAFTS ANGIOGRAPHY Left 01/13/2017   Procedure: Left Heart Cath and Cors/Grafts Angiography;  Surgeon: Wolm JINNY Rhyme, MD;  Location: [facility];  Service: Cardiovascular;  Laterality: Left;   LEFT HEART CATH AND CORS/GRAFTS ANGIOGRAPHY Left 06/09/2023   Procedure: LEFT HEART CATH AND CORS/GRAFTS ANGIOGRAPHY;  Surgeon: Ammon Blunt, MD;  Location: [facility];  Service: Cardiovascular;  Laterality: Left;   LOWER EXTREMITY ANGIOGRAPHY Left 02/23/2024   Procedure: Lower Extremity Angiography;  Surgeon: Jama Cordella MATSU, MD;  Location: [facility];  Service: Cardiovascular;  Laterality: Left;   LOWER EXTREMITY ANGIOGRAPHY Right 05/03/2024   Procedure: Lower Extremity Angiography;  Surgeon: Jama Cordella MATSU, MD;  Location: [facility];  Service: Cardiovascular;  Laterality: Right;   LOWER EXTREMITY INTERVENTION Left 02/23/2024   Procedure: LOWER EXTREMITY INTERVENTION;  Surgeon: Jama Cordella MATSU, MD;  Location: [facility];  Service: Cardiovascular;  Laterality: Left;   METATARSAL HEAD EXCISION  Right 03/21/2020   Procedure: METATARSAL HEAD RESECTION RIGHT AND DEBRIDEMENT OF ULCER;  Surgeon: Janit Thresa HERO, DPM;  Location: [facility];  Service: Podiatry;  Laterality: Right;   METATARSAL HEAD EXCISION Left 09/15/2023   Procedure: METATARSAL HEAD EXCISION;  Surgeon: Janit Thresa HERO, DPM;  Location: [facility];  Service: Orthopedics/Podiatry;  Laterality: Left;   MULTIPLE TOOTH EXTRACTIONS     TEE WITHOUT CARDIOVERSION N/A 06/27/2016   Procedure: TRANSESOPHAGEAL ECHOCARDIOGRAM (TEE);  Surgeon: Maude Fleeta Ochoa, MD;  Location: [facility] OR;  Service: Open Heart Surgery;  Laterality: N/A;   TENDON LENGTHENING Left 12/04/2023   Procedure: TENDON-ACHILLES LENGTHENING;  Surgeon: Janit Thresa HERO, DPM;  Location: [facility];  Service: Orthopedics/Podiatry;  Laterality: Left;   TRANSMETATARSAL AMPUTATION Left 12/04/2023   Procedure: TRANSMETATARSAL AMPUTATION;  Surgeon: Janit Thresa HERO, DPM;  Location: [facility];  Service: Orthopedics/Podiatry;  Laterality: Left;  Patient Active Problem List   Diagnosis Date Noted   Atherosclerosis of native arteries of the extremities with ulceration (HCC) 01/27/2024   Pain in joint, lower leg 09/05/2022   Left-sided chest wall pain 08/29/2022   Stage 3a chronic kidney disease (HCC) 12/26/2021   Chest tightness 03/22/2021   History of MI (myocardial infarction) 07/11/2020   Ulcer of left foot with fat layer exposed (HCC) 03/08/2020   Actinic keratosis 02/26/2020   Allergic rhinitis 02/26/2020   Basal cell carcinoma of skin 02/26/2020   ED (erectile dysfunction) of organic origin 02/26/2020   Hypersomnia with sleep apnea 02/26/2020   Low back pain 02/26/2020   Other specified malignant neoplasm of skin, site unspecified 02/26/2020   Other anxiety states 02/26/2020   Psychosexual dysfunction with inhibited sexual excitement 02/26/2020   Senile cataract 02/26/2020   Medicare annual wellness visit, subsequent 04/26/2019   Frequent headaches 11/04/2018   Osteoarthritis  09/29/2017   Stable angina (HCC) 04/22/2017   Bilateral carotid artery stenosis 12/10/2016   S/P CABG (coronary artery bypass graft) 06/27/2016   Type 1 diabetes mellitus with hyperglycemia (HCC) 04/04/2016   Combined fat and carbohydrate induced hyperlipemia 04/04/2016   H/O total knee replacement 03/28/2016   S/P total knee arthroplasty 02/11/2016   Arthritis of knee, degenerative 11/05/2015   Hyperlipidemia 08/28/2015   CAD (coronary artery disease) 08/28/2015   Lower extremity edema 08/28/2015   GERD (gastroesophageal reflux disease) 08/28/2015   OSA (obstructive sleep apnea) 08/28/2015   Morbid obesity with BMI of 40.0-44.9, adult (HCC) 08/28/2015   Benign essential HTN 05/18/2015    PCP:  Comer Gaskins NP   REFERRING PROVIDER: Dr. Jacques Mallet   REFERRING DIAG:   M19.012 (ICD-10-CM) - Glenohumeral arthritis, left M25.512,G89.29 (ICD-10-CM) - Chronic left shoulder pain  THERAPY DIAG:  Cervicalgia  Rationale for Evaluation and Treatment: Rehabilitation  ONSET DATE: 03/03/24    SUBJECTIVE:                                                                                                                                                                                      SUBJECTIVE STATEMENT: Pt reports being able to find a comfortable position by providing a prop under neck for improved cervical flexion. He has been able to sleep much better. He is feeling increased headaches today. This has been an ongoing issue and he is following up with Dr. Maree, who is managing this condition. Hand dominance: Right  PERTINENT HISTORY: Pt reports experiencing a sharp pain in shoulder blade and around posterior deltoid especially when laying flat on his back or laying down on his incline pillow. This pain started within the past 30 to  60 days. He does not experience this shoulder pain when sitting up. He has tried using an a pillow on top of his wedge and this provided additional relief  to the point where he was able to sleep in bed for first time in a while last night. 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 He describes his family having a history of arthritis including rheumatoid arthritis.   PAIN:  Are you having pain? Yes: NPRS scale: 7-8/10   Pain location: Left tricep and left periscapular musculature   Pain description: Sharp and shooting   Aggravating factors: Laying flat on his back   Relieving factors: Tylenol  and Voltaren.    PRECAUTIONS: None  RED FLAGS: Cervical red flags: none reported but will follow up in more detail next session.   WEIGHT BEARING RESTRICTIONS: No  FALLS:  Has patient fallen in last 6 months? No; he is a afraid of falling though   LIVING ENVIRONMENT: Lives with: lives with their spouse Lives in: House/apartment Stairs: Yes: External: 7 steps back entrance, and  front 3 steps; on right going up and on left going up Has following equipment at home: Single point cane and walker    OCCUPATION: Retired     PLOF: Independent  PATIENT GOALS:To be able to fall asleep better without being kept awake by left shoulder pain.   NEXT MD VISIT: Did not ask   OBJECTIVE:  Note: Objective measures were completed at Evaluation unless otherwise noted.  VITALS: BP 123/57 HR 66 SpO2 98%  DIAGNOSTIC FINDINGS:  CLINICAL DATA:  Pain and loss of motion.   EXAM: LEFT SHOULDER - 2+ VIEW   COMPARISON:  None Available.   FINDINGS: Mild glenohumeral joint space narrowing and inferior glenoid degenerative spurring. Moderate acromioclavicular joint space narrowing and peripheral osteophytosis. Minimal lateral downsloping of the acromion. Partial visualization of median sternotomy and CABG. No acute fracture or dislocation.   IMPRESSION: Mild glenohumeral and moderate acromioclavicular osteoarthritis.     Electronically Signed   By: Tanda Lyons M.D.   On: 05/03/2024  10:10  PATIENT SURVEYS:  Neck Disability Index: 22/50 (44%)  COGNITION: Overall cognitive status: Within functional limits for tasks assessed     SENSATION: WFL  POSTURE: Forward head and rounded shoulders      CERVICAL A/PROM            Flex 35            Ext 20*- pt reports concordant symptoms with pain radiating down left shoulder              SB R/L 25/25              Rot R/L  40/40    UPPER EXTREMITY ROM:   Active ROM Right eval Left eval  Shoulder flexion 160 160  Shoulder extension    Shoulder abduction 160 160   Shoulder adduction    Shoulder internal rotation    Shoulder external rotation    Elbow flexion    Elbow extension    Wrist flexion    Wrist extension    Wrist ulnar deviation    Wrist radial deviation    Wrist pronation    Wrist supination    (Blank rows = not tested)  Combined Shoulder ER                  Occiput  Occiput   Combined Shoulder IR                     PSIS             PSIS   UPPER EXTREMITY MMT:  MMT Right eval Left eval  Shoulder flexion 4 4  Shoulder extension 4+ 4+  Shoulder abduction 4 4  Shoulder adduction    Shoulder internal rotation at 0 deg  4 4  Shoulder external rotation at 0 deg  4 4  Middle trapezius 4 4  Lower trapezius 4 4  Elbow flexion    Elbow extension    Wrist flexion    Wrist extension    Wrist ulnar deviation    Wrist radial deviation    Wrist pronation    Wrist supination    Grip strength (lbs)    (Blank rows = not tested)  SHOULDER SPECIAL TESTS: Impingement tests: Painful arc test: negative Rotator cuff assessment: Full can test: negative and Infraspinatus test: negative    PALPATION:  Left tricep and rhomboid  TTP                                                                                                                              TREATMENT DATE:   05/19/24: THEREX  UBE with seat at 10 for 2.5 min forward and 2.5 backward with resistance at 3    Neck Disability  Index (NDI):   Seated Scapular Rows 2 x 10  Upper Trap Stretch 2 x 60 sec   Periscapular MMT (See Above) Chin Tucks 1 x 10  -min VC for sequence exercise Doorway Pec Stretch 3 x 60 sec   PATIENT EDUCATION: Education details: Form and technique for correct performance of exercise and explanation about underlying cervical pathology   Person educated: Patient Education method: Explanation, Demonstration, Verbal cues, and Handouts Education comprehension: verbalized understanding, returned demonstration, verbal cues required, and tactile cues required  HOME EXERCISE PROGRAM: Access Code: [email or web address] URL: [email or web address] Date: 05/19/2024 Prepared by: Toribio Servant  Exercises - Seated Upper Trapezius Stretch  - 1 x daily - 7 x weekly - 3 reps - 60 sec  hold - Seated Cervical Retraction  - 1 x daily - 7 x weekly - 2 sets - 10 reps - 2-3 sec  hold - Seated Shoulder Row with Anchored Resistance  - 3-4 x weekly - 3 sets - 10 reps - Doorway Pec Stretch at 60 Elevation  - 1 x daily - 7 x weekly - 3 reps - 30-60 sec hold  ASSESSMENT:  CLINICAL IMPRESSION: Pt presents for initial treatment for cervical pain with bilateral radiating pain. Pt able to perform all exercises without an exacerbation of symptoms. He will benefit from skilled PT to address these aforementioned deficits to return to sleeping without pain and discomfort and without pain and discomfort keeping him from sleeping.    OBJECTIVE IMPAIRMENTS: decreased knowledge of condition, decreased ROM,  decreased strength, hypomobility, impaired UE functional use, postural dysfunction, obesity, and pain.   ACTIVITY LIMITATIONS: carrying, lifting, sleeping, bathing, dressing, reach over head, and hygiene/grooming  PARTICIPATION LIMITATIONS: cleaning, laundry, interpersonal relationship, driving, shopping, community activity, and yard work  PERSONAL FACTORS: Age, Fitness, and 3+ comorbidities: T2DM, are also affecting  patient's functional outcome.   REHAB POTENTIAL: Good  CLINICAL DECISION MAKING: Stable/uncomplicated  EVALUATION COMPLEXITY: Low   GOALS: Goals reviewed with patient? No  SHORT TERM GOALS: Target date: 05/30/2024  Patient will demonstrate undestanding of home exercise plan by performing exercises correctly with evidence of good carry over with min to no verbal or tactile cues .   Baseline: NT  05/19/24 Performing independently  Goal status: ONGOING   2.  Patient will develop night time cervical spine position to avoid cervical extension like correct positioning through purchasing cervical pillow or using his own equipment and demonstrating this understanding.  Baseline: NT  05/19/24 Able to adjust position for improved cervical flexion  Goal status: ACHIEVED    LONG TERM GOALS: Target date: 08/08/2024  Patient will show a statistically significant improvement in her neck function as evidence by >=7.5 pt decrease in her neck disability index score to better be able to move neck to improve visual field while driving to avoid accidents. Vernel et al, 2009)                 Baseline: 22/50 (44%)                 Goal status: ONGOING    2.  Patient will increase cervical AROM to be near or within normal range of motion for improved cervical function and pain response to movement to have improved sleep.  Baseline: Cervical Ext 20, Cervical Flex 35, Cervical SB R/L 25/25 Goal status: ONGOING   3.  Patient will improve periscapular strength by 1/3 grade MMT (ie 4- to 4) for improved cervical stability and improved posture with decreased forward head and rounded shoulders. Baseline: Mid Trap 4/4, Low Trap 4/4, Shoulder Ext R 4+/4+ Goal status: ONGOING   4.  Patient will be able to sleep without experiencing left sided cervical pain >=3/10 NRPS as evidence of improved cervical function and to improve sleep quality.  Baseline: 7-8/10 NRPS  Goal status: ONGOING      PLAN:  PT FREQUENCY:  1-2x/week  PT DURATION: 12 weeks  PLANNED INTERVENTIONS: 97164- PT Re-evaluation, 97750- Physical Performance Testing, 97110-Therapeutic exercises, 97530- Therapeutic activity, [id number]- Neuromuscular re-education, 97535- Self Care, [id number]- Manual therapy, [phone]- Aquatic Therapy, [id number]- Electrical stimulation (unattended), [phone]- Electrical stimulation (manual), 20560 (1-2 muscles), 20561 (3+ muscles)- Dry Needling, Patient/Family education, Joint mobilization, Joint manipulation, Spinal manipulation, Spinal mobilization, Scar mobilization, Visual/preceptual remediation/compensation, Cryotherapy, and Moist heat  PLAN FOR NEXT SESSION:  Cervical PROM. Shoulder ER/IR at 90 deg abd. Check in about red flags. Ambulating with use of RWW or rollator. OMEGA seated rows.  Toribio Servant PT, DPT  [facility] [address] [place], [address] Phone: [phone]   Fax:  [phone]

## 2024-05-21 LAB — WOUND CULTURE
MICRO NUMBER:: 16709368
SPECIMEN QUALITY:: ADEQUATE

## 2024-05-21 LAB — HOUSE ACCOUNT TRACKING

## 2024-05-24 ENCOUNTER — Ambulatory Visit (INDEPENDENT_AMBULATORY_CARE_PROVIDER_SITE_OTHER)

## 2024-05-24 ENCOUNTER — Ambulatory Visit (INDEPENDENT_AMBULATORY_CARE_PROVIDER_SITE_OTHER): Admitting: Nurse Practitioner

## 2024-05-24 ENCOUNTER — Encounter (INDEPENDENT_AMBULATORY_CARE_PROVIDER_SITE_OTHER): Payer: Self-pay | Admitting: Nurse Practitioner

## 2024-05-24 VITALS — BP 108/65 | HR 67 | Ht 70.0 in | Wt 296.5 lb

## 2024-05-24 DIAGNOSIS — I10 Essential (primary) hypertension: Secondary | ICD-10-CM

## 2024-05-24 DIAGNOSIS — I739 Peripheral vascular disease, unspecified: Secondary | ICD-10-CM | POA: Diagnosis not present

## 2024-05-24 DIAGNOSIS — I7025 Atherosclerosis of native arteries of other extremities with ulceration: Secondary | ICD-10-CM | POA: Diagnosis not present

## 2024-05-24 DIAGNOSIS — E785 Hyperlipidemia, unspecified: Secondary | ICD-10-CM | POA: Diagnosis not present

## 2024-05-24 DIAGNOSIS — Z9889 Other specified postprocedural states: Secondary | ICD-10-CM

## 2024-05-24 NOTE — Progress Notes (Signed)
 Subjective:    Patient ID: Lawrence [name], male    DOB: 28-Sep-1951, 73 y.o.   MRN: [id number] Chief Complaint  Patient presents with   Follow up with Schnier, Cordella MATSU, MD (Vascular Surgery) in    The patient returns to the office for followup and review status post angiogram with intervention on 05/03/2024.   Procedure: Procedure(s) Performed:             1.  Introduction catheter into right lower extremity 3rd order catheter placement with additional third order catheter placement             2.  Contrast injection right lower extremity for distal runoff with additional 3rd order             3.  Percutaneous transluminal angioplasty right anterior tibial to 3 mm                          4.  Percutaneous transluminal angioplasty right posterior tibial to 3 mm             5.  Star close closure left common femoral arteriotomy   The patient notes improvement in the lower extremity symptoms. No interval shortening of the patient's claudication distance or rest pain symptoms.  There is been a small opening with drainage in his right foot however he notes that otherwise there has been good progress with healing communicated by the podiatrist.  There have been no significant changes to the patient's overall health care.  No documented history of amaurosis fugax or recent TIA symptoms. There are no recent neurological changes noted. No documented history of DVT, PE or superficial thrombophlebitis. The patient denies recent episodes of angina or shortness of breath.   ABI's Rt=[place] and Lt=1.25  (previous ABI's Rt=[place] and Lt=1.29) Duplex US  of the bilateral tibial vessels reveals multiphasic waveforms bilaterally.  Comparison with the previous studies these are drastically improved.    Review of Systems  Skin:  Positive for wound.  All other systems reviewed and are negative.      Objective:   Physical Exam Vitals reviewed.  HENT:     Head: Normocephalic.  Cardiovascular:      Rate and Rhythm: Normal rate.     Pulses:          Dorsalis pedis pulses are detected w/ Doppler on the right side and detected w/ Doppler on the left side.       Posterior tibial pulses are detected w/ Doppler on the right side and detected w/ Doppler on the left side.  Pulmonary:     Effort: Pulmonary effort is normal.  Skin:    General: Skin is warm and dry.  Neurological:     Mental Status: He is alert and oriented to person, place, and time.  Psychiatric:        Mood and Affect: Mood normal.        Behavior: Behavior normal.        Thought Content: Thought content normal.        Judgment: Judgment normal.     BP 108/65   Pulse 67   Ht 5' 10 (1.778 m)   Wt 296 lb 8 oz (134.5 kg)   BMI 42.54 kg/m   Past Medical History:  Diagnosis Date   Allergies    Anginal pain (HCC)    Bilateral carotid artery disease (HCC)    Breast pain, left 08/29/2022   CAD (coronary  artery disease) 07/2007   a.) s/p NSTEMI 07/2007 - sig multi-vessel CAD not amenable to PCI; b.) LHC 06/11/2016: multi vessel CAD not amenable to PCI --> refer to CVTS; c.) s/p 4v CABG 06/27/2016; d.) LHC 06/09/2023: 75% mLM, 80% o-pLAD, 60% mLAD, 100% p-mLCx, 100% OM1, 95% pRCA, 100% mRCA, 50% m-dLAD. LIMA-LAD and SVG-OM1 patent. CTO SVG-RCA with L-R collaterals - med mgmt   Carpal tunnel syndrome on left    a.) s/p release 04/2016   CKD (chronic kidney disease), stage III (HCC)    COVID-19 12/09/2022   Diabetic peripheral neuropathy (HCC)    Diastolic dysfunction    a.) TTE 06/25/2016: EF 60-654%. no RWMAs, G1DD, mild AoV annular calc; b.) TTE 03/19/2017: EF 50-55%, no RWMAs, mild MR; c.) TTE 07/22/2019: EF 55-60%, midl LVH, no RWMAs, triv MR, mild TR   Dizziness 04/30/2021   Dyspnea    GERD (gastroesophageal reflux disease)    Glaucoma    Headache    History of bilateral cataract extraction    HLD (hyperlipidemia)    HTN (hypertension)    Long term current use of clopidogrel     Long-term use of aspirin   therapy    MRSA (methicillin resistant staph aureus) culture positive    2006 - 2008   Myocardial infarction (HCC) 04/04/2016   NSTEMI (non-ST elevated myocardial infarction) (HCC) 07/27/2007   a.) LHC 07/30/2007: 30% mLAD, 99% D1, 50% pLCx, 40% mLCx, 70/75/85% dLCx, 50% OM1, 75/80/80 OM2, 50% pRCA, 60% mRCA, 85% dRCA --> not amenable to PCI   OSA on CPAP    Osteoarthritis    PAD (peripheral artery disease) (HCC)    a. 03/2022 Lower Ext Angio: No signif Ao-iliac dzs. R PT diff dzs and occluded above ankle. R Pedal arch intact w/ excellent antegrade flow provided by R AT-->no indication for revasc.   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   a.) LIMA-LAD, SVG-RCA, seq SVG-OM1-OM2   S/P tube myringotomy    Skin cancer, basal cell    Skin ulcer of right great toe, unspecified ulcer stage (HCC)    T2DM (type 2 diabetes mellitus) (HCC)    Wears glasses    Wears partial dentures    top    Social History   Socioeconomic History   Marital status: Married    Spouse name: Kewley,Lajuana   Number of children: Not on file   Years of education: Not on file   Highest education level: Not on file  Occupational History   Not on file  Tobacco Use   Smoking status: Former    Current packs/day: 0.00    Average packs/day: 1 pack/day for 16.7 years (16.7 ttl pk-yrs)    Types: Cigarettes    Start date: 11/03/1969    Quit date: 07/22/1986    Years since quitting: 37.8   Smokeless tobacco: Never  Vaping Use   Vaping status: Never Used  Substance and Sexual Activity   Alcohol use: Not Currently   Drug use: No   Sexual activity: Never  Other Topics Concern   Not on file  Social History Narrative   Married.  Lives at home with wife.    Social Drivers of Corporate investment banker Strain: Low Risk  (02/18/2024)   Received from [facility]   Overall Financial Resource Strain (CARDIA)    Difficulty of Paying Living Expenses: Not hard at all   Food Insecurity: No Food Insecurity (02/18/2024)   Received  from [facility]   Hunger Vital Sign    Within the past 12 months, you worried that your food would run out before you got the money to buy more.: Never true    Within the past 12 months, the food you bought just didn't last and you didn't have money to get more.: Never true  Transportation Needs: No Transportation Needs (02/18/2024)   Received from [facility]    In the past 12 months, has lack of transportation kept you from medical appointments or from getting medications?: No    Lack of Transportation (Non-Medical): No  Physical Activity: Inactive (10/20/2023)   Exercise Vital Sign    Days of Exercise per Week: 0 days    Minutes of Exercise per Session: 0 min  Stress: No Stress Concern Present (10/20/2023)   [employer] of Occupational Health - Occupational Stress Questionnaire    Feeling of Stress : Only a little  Social Connections: Moderately Integrated (10/20/2023)   Social Connection and Isolation Panel    Frequency of Communication with Friends and Family: More than three times a week    Frequency of Social Gatherings with Friends and Family: Twice a week    Attends Religious Services: 1 to 4 times per year    Active Member of [employer] or Organizations: No    Attends Banker Meetings: Never    Marital Status: Married  Catering manager Violence: Not At Risk (10/20/2023)   Humiliation, Afraid, Rape, and Kick questionnaire    Fear of Current or Ex-Partner: No    Emotionally Abused: No    Physically Abused: No    Sexually Abused: No    Past Surgical History:  Procedure Laterality Date   ABDOMINAL AORTOGRAM W/LOWER EXTREMITY N/A 03/26/2022   Procedure: ABDOMINAL AORTOGRAM W/LOWER EXTREMITY;  Surgeon: Darron Deatrice LABOR, MD;  [facility]: [facility];  Service: Cardiovascular;  Laterality: N/A;   AMPUTATION TOE Bilateral 06/26/2023    Procedure: AMPUTATION TOE MPJ OF THE JOINT,PARTIAL TOE AMPUTUATION;  Surgeon: Janit Thresa HERO, DPM;  Location: [facility];  Service: Orthopedics/Podiatry;  Laterality: Bilateral;   AMPUTATION TOE Right 12/04/2023   Procedure: AMPUTATION TOE INTERPHALANGEAL 3RD;  Surgeon: Janit Thresa HERO, DPM;  Location: [facility];  Service: Orthopedics/Podiatry;  Laterality: Right;   APPENDECTOMY     CARDIAC CATHETERIZATION N/A 06/11/2016   Procedure: Left Heart Cath and Coronary Angiography;  Surgeon: Wolm JINNY Rhyme, MD;  Location: [facility];  Service: Cardiovascular;  Laterality: N/A;   CARPAL TUNNEL RELEASE Left 04/24/2016   Procedure: CARPAL TUNNEL RELEASE;  Surgeon: Ozell Flake, MD;  Location: [facility];  Service: Orthopedics;  Laterality: Left;   CATARACT EXTRACTION W/ INTRAOCULAR LENS  IMPLANT, BILATERAL Bilateral    CATARACT EXTRACTION, BILATERAL     R eye 07/16/12, L eye 08/13/12 - with lens implant in both eyes   CORONARY ARTERY BYPASS GRAFT N/A 06/27/2016   Procedure: CORONARY ARTERY BYPASS GRAFTING times four using left internal mammary artery and right leg saphenous vein;  Surgeon: Maude Fleeta Ochoa, MD;  Location: [facility] OR;  Service: Open Heart Surgery;  Laterality: N/A;   FOREARM FRACTURE SURGERY Left 1961   KNEE ARTHROPLASTY Left 02/11/2016   Procedure: COMPUTER ASSISTED TOTAL KNEE ARTHROPLASTY;  Surgeon: Lynwood SHAUNNA Hue, MD;  Location: [facility];  Service: Orthopedics;  Laterality: Left;   KNEE ARTHROSCOPY Right 2000   LEFT HEART CATH AND CORONARY ANGIOGRAPHY Left 07/30/2007   Procedure: LEFT  HEART CATH AND CORONARY ANGIOGRAPHY; Location: [facility]; Surgeon: Wolm Rhyme, MD   LEFT HEART CATH AND CORS/GRAFTS ANGIOGRAPHY Left 01/13/2017   Procedure: Left Heart Cath and Cors/Grafts Angiography;  Surgeon: Wolm JINNY Rhyme, MD;  Location: [facility];  Service: Cardiovascular;  Laterality: Left;   LEFT HEART CATH AND CORS/GRAFTS ANGIOGRAPHY Left 06/09/2023   Procedure: LEFT HEART CATH AND  CORS/GRAFTS ANGIOGRAPHY;  Surgeon: Ammon Blunt, MD;  Location: [facility];  Service: Cardiovascular;  Laterality: Left;   LOWER EXTREMITY ANGIOGRAPHY Left 02/23/2024   Procedure: Lower Extremity Angiography;  Surgeon: Jama Cordella MATSU, MD;  Location: [facility];  Service: Cardiovascular;  Laterality: Left;   LOWER EXTREMITY ANGIOGRAPHY Right 05/03/2024   Procedure: Lower Extremity Angiography;  Surgeon: Jama Cordella MATSU, MD;  Location: [facility];  Service: Cardiovascular;  Laterality: Right;   LOWER EXTREMITY INTERVENTION Left 02/23/2024   Procedure: LOWER EXTREMITY INTERVENTION;  Surgeon: Jama Cordella MATSU, MD;  Location: [facility];  Service: Cardiovascular;  Laterality: Left;   METATARSAL HEAD EXCISION Right 03/21/2020   Procedure: METATARSAL HEAD RESECTION RIGHT AND DEBRIDEMENT OF ULCER;  Surgeon: Janit Thresa HERO, DPM;  Location: [facility];  Service: Podiatry;  Laterality: Right;   METATARSAL HEAD EXCISION Left 09/15/2023   Procedure: METATARSAL HEAD EXCISION;  Surgeon: Janit Thresa HERO, DPM;  Location: [facility];  Service: Orthopedics/Podiatry;  Laterality: Left;   MULTIPLE TOOTH EXTRACTIONS     TEE WITHOUT CARDIOVERSION N/A 06/27/2016   Procedure: TRANSESOPHAGEAL ECHOCARDIOGRAM (TEE);  Surgeon: Maude Fleeta Ochoa, MD;  Location: [facility] OR;  Service: Open Heart Surgery;  Laterality: N/A;   TENDON LENGTHENING Left 12/04/2023   Procedure: TENDON-ACHILLES LENGTHENING;  Surgeon: Janit Thresa HERO, DPM;  Location: [facility];  Service: Orthopedics/Podiatry;  Laterality: Left;   TRANSMETATARSAL AMPUTATION Left 12/04/2023   Procedure: TRANSMETATARSAL AMPUTATION;  Surgeon: Janit Thresa HERO, DPM;  Location: [facility];  Service: Orthopedics/Podiatry;  Laterality: Left;    Family History  Problem Relation Age of Onset   Hypertension Mother    Lupus Mother    Heart Problems Mother    Hypertension Father    Diabetes Father    Heart attack Father 14       died in his 84s    Drug abuse Brother    Heart disease Brother    Heart attack Maternal Grandmother    Arthritis Maternal Grandmother    Arthritis Maternal Grandfather    Heart attack Paternal Grandmother    Arthritis Paternal Grandmother    Arthritis Paternal Grandfather     Allergies  Allergen Reactions   Latex Other (See Comments)    Blister and burning (45M insulin  pump latex band aid )   Nitroglycerin  Nausea Only and Other (See Comments)    Patches only - headache    Statins Other (See Comments)    BODY PAIN, Pt taking Crestor *      Fluvastatin Other (See Comments)    Muscle pain   Fluvastatin Sodium Other (See Comments)    Myalgias   Percocet [Oxycodone -Acetaminophen ] Itching   Rosuvastatin  Other (See Comments)    Muscle pain, Myalgias   Simvastatin Other (See Comments)    Muscle pain, Myalgias   Lipitor [Atorvastatin] Other (See Comments) and Rash    BODY PAIN   Lisinopril  Other (See Comments) and Nausea Only    Light headed, dizzy Other reaction(s): Dizziness   Zetia [Ezetimibe] Other (See Comments)    Muscle Pain       Latest Ref  Rng & Units 09/09/2023    2:38 PM 06/26/2023    2:40 PM 04/27/2023    8:51 PM  CBC  WBC 4.0 - 10.5 K/uL 7.2   6.5   Hemoglobin 13.0 - 17.0 g/dL 87.3  88.3  87.4   Hematocrit 39.0 - 52.0 % 36.9  34.0  38.5   Platelets 150 - 400 K/uL 191   205       CMP     Component Value Date/Time   NA 141 11/02/2023 1454   NA 139 02/25/2022 1150   NA 137 10/12/2013 0515   K 4.2 11/02/2023 1454   K 3.9 10/12/2013 0515   CL 101 11/02/2023 1454   CL 101 10/12/2013 0515   CO2 30 11/02/2023 1454   CO2 31 10/12/2013 0515   GLUCOSE 115 (H) 11/02/2023 1454   GLUCOSE 218 (H) 10/12/2013 0515   BUN 32 (H) 05/03/2024 1029   BUN 19 02/25/2022 1150   BUN 20 (H) 10/12/2013 0515   CREATININE 1.09 05/03/2024 1029   CREATININE 0.99 10/12/2013 0515   CALCIUM  9.5 11/02/2023 1454   CALCIUM  8.8 10/12/2013 0515   PROT 6.6 11/02/2023 1454   PROT 6.6 02/25/2022 1150    ALBUMIN  4.0 11/02/2023 1454   ALBUMIN  4.3 02/25/2022 1150   AST 14 11/02/2023 1454   ALT 12 11/02/2023 1454   ALKPHOS 90 11/02/2023 1454   BILITOT 0.7 11/02/2023 1454   BILITOT 0.7 02/25/2022 1150   GFR 52.06 (L) 11/02/2023 1454   EGFR 51 (L) 02/25/2022 1150   GFRNONAA >60 05/03/2024 1029   GFRNONAA >60 10/12/2013 0515     No results found.     Assessment & Plan:   1. Atherosclerosis of native arteries of the extremities with ulceration (HCC) (Primary) The patient certainly notes improvement in his studies as well as what is being seen by Dr. Janit.  However given that he is in the midst of trying to heal these wounds as well as with the new wound that has occurred on the right foot, I think you will be prudent to have a sooner follow-up in 6 to 8 weeks with noninvasive studies.  Patient is advised however if there is sudden change for new symptoms he should follow-up sooner.  2. Benign essential HTN Continue antihypertensive medications as already ordered, these medications have been reviewed and there are no changes at this time.  3. Hyperlipidemia, unspecified hyperlipidemia type Continue statin as ordered and reviewed, no changes at this time   Current Outpatient Medications on File Prior to Visit  Medication Sig Dispense Refill   acetaminophen  (TYLENOL ) 500 MG tablet Take 1,000 mg by mouth every 6 (six) hours as needed for moderate pain (pain score 4-6).     AIMOVIG 140 MG/ML SOAJ Inject 140 mg into the skin every 30 (thirty) days.     albuterol  (VENTOLIN  HFA) 108 (90 Base) MCG/ACT inhaler Inhale 2 puffs into the lungs every 4 (four) hours as needed for wheezing. 1 each 0   amoxicillin  (AMOXIL ) 500 MG tablet Take 2,000 mg by mouth See admin instructions. Take 4 capsules (2000 mg) by mouth 1 hour prior to dental appointments     aspirin  81 MG EC tablet Take 81 mg by mouth daily. Swallow whole.     Calcium  Carb-Cholecalciferol  (CALCIUM  + D3 PO) Take 1 tablet by mouth in the  morning and at bedtime.     carvedilol (COREG) 12.5 MG tablet Take 12.5 mg by mouth 2 (two) times daily with a  meal.     Cholecalciferol  (VITAMIN D ) 50 MCG (2000 UT) tablet Take 2,000 Units by mouth in the morning and at bedtime.     clopidogrel  (PLAVIX ) 75 MG tablet Take 1 tablet (75 mg total) by mouth daily. 30 tablet 0   famotidine  (ACID CONTROLLER) 10 MG tablet Take 10 mg by mouth in the morning.     fluticasone (FLONASE) 50 MCG/ACT nasal spray Place 1 spray into both nostrils daily as needed for allergies.     furosemide  (LASIX ) 40 MG tablet Take 1 tablet (40 mg total) by mouth daily. 30 tablet 1   glucagon 1 MG injection Inject 1 mg into the skin once as needed.     GLUTOSE 15 40 % GEL Take 1 Tube by mouth once as needed for low blood sugar.     insulin  aspart (NOVOLOG ) 100 UNIT/ML injection Inject 20 Units into the skin 3 (three) times daily. (Patient taking differently: Inject 10-20 Units into the skin 3 (three) times daily with meals. Sliding Scale will go up to 25 units for BG > 300 - 400) 10 mL 5   insulin  glargine-yfgn (SEMGLEE ) 100 UNIT/ML injection Inject 20 Units into the skin at bedtime.     isosorbide  mononitrate (IMDUR ) 60 MG 24 hr tablet Take 60 mg by mouth in the morning and at bedtime.     loratadine  (CLARITIN ) 10 MG tablet Take 10 mg by mouth in the morning.     nitroGLYCERIN  (NITROSTAT ) 0.4 MG SL tablet DISSOLVE 1 TABLET UNDER THE TONGUE EVERY 5 MINUTES AS NEEDED FOR CHEST PAIN 30 tablet 0   potassium chloride  SA (K-DUR,KLOR-CON ) 20 MEQ tablet Take 1 tablet (20 mEq total) by mouth daily. 30 tablet 1   rosuvastatin  (CRESTOR ) 20 MG tablet Take 1 tablet (20 mg total) by mouth daily at 6 PM. 30 tablet 1   Travoprost, BAK Free, (TRAVATAN) 0.004 % SOLN ophthalmic solution Place 1 drop into both eyes at bedtime.      vitamin C  (ASCORBIC ACID ) 500 MG tablet Take 500 mg by mouth 2 (two) times daily.      No current facility-administered medications on file prior to visit.    There  are no Patient Instructions on file for this visit. No follow-ups on file.   Derry Arbogast E Roseann Kees, NP

## 2024-05-27 ENCOUNTER — Telehealth: Payer: Self-pay

## 2024-05-27 NOTE — Telephone Encounter (Signed)
 Shoes are in [place] per safestep 01/08/2024 Per Medicare the documents must be signed by an MD or a DO. Gh  Ppw was signed by Dr. Greig Ring and Comer Gaskins, NP

## 2024-05-30 LAB — VAS US ABI WITH/WO TBI: Left ABI: 1.25

## 2024-05-31 ENCOUNTER — Ambulatory Visit (INDEPENDENT_AMBULATORY_CARE_PROVIDER_SITE_OTHER): Admitting: Podiatry

## 2024-05-31 ENCOUNTER — Encounter: Payer: Self-pay | Admitting: Podiatry

## 2024-05-31 VITALS — Ht 70.0 in | Wt 296.5 lb

## 2024-05-31 DIAGNOSIS — L97522 Non-pressure chronic ulcer of other part of left foot with fat layer exposed: Secondary | ICD-10-CM | POA: Diagnosis not present

## 2024-05-31 DIAGNOSIS — L97512 Non-pressure chronic ulcer of other part of right foot with fat layer exposed: Secondary | ICD-10-CM

## 2024-05-31 DIAGNOSIS — E11621 Type 2 diabetes mellitus with foot ulcer: Secondary | ICD-10-CM

## 2024-05-31 MED ORDER — DOXYCYCLINE HYCLATE 100 MG PO TABS: 100.0000 mg | ORAL_TABLET | Freq: Two times a day (BID) | ORAL | 0 refills | Status: DC

## 2024-05-31 NOTE — Progress Notes (Signed)
 Chief Complaint  Patient presents with   Wound Check    Pt is here due to right foot, states he fell Thursday and used his foot to get back up, states the area is sore and has been bleeding. Left foot there is an area that has been draining as well.    Subjective:  Patient presents today for follow-up evaluation of an ulcer to the right third digit partial toe amputation site as well as a draining sinus to the left foot.  They have noticed some increased drainage coming from the left foot.  MRI is pending.   PSxHx TMA with TAL left.  Partial toe amputation RT third.  DOS: 12/04/2023.     Past Medical History:  Diagnosis Date   Allergies    Anginal pain (HCC)    Bilateral carotid artery disease (HCC)    Breast pain, left 08/29/2022   CAD (coronary artery disease) 07/2007   a.) s/p NSTEMI 07/2007 - sig multi-vessel CAD not amenable to PCI; b.) LHC 06/11/2016: multi vessel CAD not amenable to PCI --> refer to CVTS; c.) s/p 4v CABG 06/27/2016; d.) LHC 06/09/2023: 75% mLM, 80% o-pLAD, 60% mLAD, 100% p-mLCx, 100% OM1, 95% pRCA, 100% mRCA, 50% m-dLAD. LIMA-LAD and SVG-OM1 patent. CTO SVG-RCA with L-R collaterals - med mgmt   Carpal tunnel syndrome on left    a.) s/p release 04/2016   CKD (chronic kidney disease), stage III (HCC)    COVID-19 12/09/2022   Diabetic peripheral neuropathy (HCC)    Diastolic dysfunction    a.) TTE 06/25/2016: EF 60-654%. no RWMAs, G1DD, mild AoV annular calc; b.) TTE 03/19/2017: EF 50-55%, no RWMAs, mild MR; c.) TTE 07/22/2019: EF 55-60%, midl LVH, no RWMAs, triv MR, mild TR   Dizziness 04/30/2021   Dyspnea    GERD (gastroesophageal reflux disease)    Glaucoma    Headache    History of bilateral cataract extraction    HLD (hyperlipidemia)    HTN (hypertension)    Long term current use of clopidogrel     Long-term use of aspirin  therapy    MRSA (methicillin resistant staph aureus) culture positive    2006 - 2008   Myocardial infarction (HCC) 04/04/2016    NSTEMI (non-ST elevated myocardial infarction) (HCC) 07/27/2007   a.) LHC 07/30/2007: 30% mLAD, 99% D1, 50% pLCx, 40% mLCx, 70/75/85% dLCx, 50% OM1, 75/80/80 OM2, 50% pRCA, 60% mRCA, 85% dRCA --> not amenable to PCI   OSA on CPAP    Osteoarthritis    PAD (peripheral artery disease) (HCC)    a. 03/2022 Lower Ext Angio: No signif Ao-iliac dzs. R PT diff dzs and occluded above ankle. R Pedal arch intact w/ excellent antegrade flow provided by R AT-->no indication for revasc.   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   a.) LIMA-LAD, SVG-RCA, seq SVG-OM1-OM2   S/P tube myringotomy    Skin cancer, basal cell    Skin ulcer of right great toe, unspecified ulcer stage (HCC)    T2DM (type 2 diabetes mellitus) (HCC)    Wears glasses    Wears partial dentures    top    Past Surgical History:  Procedure Laterality Date   ABDOMINAL AORTOGRAM W/LOWER EXTREMITY N/A 03/26/2022   Procedure: ABDOMINAL AORTOGRAM W/LOWER EXTREMITY;  Surgeon: Darron Deatrice LABOR, MD;  Location: [facility];  Service: Cardiovascular;  Laterality: N/A;   AMPUTATION TOE Bilateral 06/26/2023   Procedure: AMPUTATION TOE MPJ OF THE  JOINT,PARTIAL TOE AMPUTUATION;  Surgeon: Janit Thresa HERO, DPM;  Location: [facility];  Service: Orthopedics/Podiatry;  Laterality: Bilateral;   AMPUTATION TOE Right 12/04/2023   Procedure: AMPUTATION TOE INTERPHALANGEAL 3RD;  Surgeon: Janit Thresa HERO, DPM;  Location: [facility];  Service: Orthopedics/Podiatry;  Laterality: Right;   APPENDECTOMY     CARDIAC CATHETERIZATION N/A 06/11/2016   Procedure: Left Heart Cath and Coronary Angiography;  Surgeon: Wolm JINNY Rhyme, MD;  Location: [facility];  Service: Cardiovascular;  Laterality: N/A;   CARPAL TUNNEL RELEASE Left 04/24/2016   Procedure: CARPAL TUNNEL RELEASE;  Surgeon: Ozell Flake, MD;  Location: [facility];  Service: Orthopedics;  Laterality: Left;   CATARACT EXTRACTION W/ INTRAOCULAR LENS  IMPLANT,  BILATERAL Bilateral    CATARACT EXTRACTION, BILATERAL     R eye 07/16/12, L eye 08/13/12 - with lens implant in both eyes   CORONARY ARTERY BYPASS GRAFT N/A 06/27/2016   Procedure: CORONARY ARTERY BYPASS GRAFTING times four using left internal mammary artery and right leg saphenous vein;  Surgeon: Maude Fleeta Ochoa, MD;  Location: [facility] OR;  Service: Open Heart Surgery;  Laterality: N/A;   FOREARM FRACTURE SURGERY Left 1961   KNEE ARTHROPLASTY Left 02/11/2016   Procedure: COMPUTER ASSISTED TOTAL KNEE ARTHROPLASTY;  Surgeon: Lynwood SHAUNNA Hue, MD;  Location: [facility];  Service: Orthopedics;  Laterality: Left;   KNEE ARTHROSCOPY Right 2000   LEFT HEART CATH AND CORONARY ANGIOGRAPHY Left 07/30/2007   Procedure: LEFT HEART CATH AND CORONARY ANGIOGRAPHY; Location: [facility]; Surgeon: Wolm Rhyme, MD   LEFT HEART CATH AND CORS/GRAFTS ANGIOGRAPHY Left 01/13/2017   Procedure: Left Heart Cath and Cors/Grafts Angiography;  Surgeon: Wolm JINNY Rhyme, MD;  Location: [facility];  Service: Cardiovascular;  Laterality: Left;   LEFT HEART CATH AND CORS/GRAFTS ANGIOGRAPHY Left 06/09/2023   Procedure: LEFT HEART CATH AND CORS/GRAFTS ANGIOGRAPHY;  Surgeon: Ammon Blunt, MD;  Location: [facility];  Service: Cardiovascular;  Laterality: Left;   LOWER EXTREMITY ANGIOGRAPHY Left 02/23/2024   Procedure: Lower Extremity Angiography;  Surgeon: Jama Cordella MATSU, MD;  Location: [facility];  Service: Cardiovascular;  Laterality: Left;   LOWER EXTREMITY ANGIOGRAPHY Right 05/03/2024   Procedure: Lower Extremity Angiography;  Surgeon: Jama Cordella MATSU, MD;  Location: [facility];  Service: Cardiovascular;  Laterality: Right;   LOWER EXTREMITY INTERVENTION Left 02/23/2024   Procedure: LOWER EXTREMITY INTERVENTION;  Surgeon: Jama Cordella MATSU, MD;  Location: [facility];  Service: Cardiovascular;  Laterality: Left;   METATARSAL HEAD EXCISION Right 03/21/2020   Procedure: METATARSAL  HEAD RESECTION RIGHT AND DEBRIDEMENT OF ULCER;  Surgeon: Janit Thresa HERO, DPM;  Location: [facility];  Service: Podiatry;  Laterality: Right;   METATARSAL HEAD EXCISION Left 09/15/2023   Procedure: METATARSAL HEAD EXCISION;  Surgeon: Janit Thresa HERO, DPM;  Location: [facility];  Service: Orthopedics/Podiatry;  Laterality: Left;   MULTIPLE TOOTH EXTRACTIONS     TEE WITHOUT CARDIOVERSION N/A 06/27/2016   Procedure: TRANSESOPHAGEAL ECHOCARDIOGRAM (TEE);  Surgeon: Maude Fleeta Ochoa, MD;  Location[facility];  Service: Open Heart Surgery;  Laterality: N/A;   TENDON LENGTHENING Left 12/04/2023   Procedure: TENDON-ACHILLES LENGTHENING;  Surgeon: Janit Thresa HERO, DPM;  Location: [facility];  Service: Orthopedics/Podiatry;  Laterality: Left;   TRANSMETATARSAL AMPUTATION Left 12/04/2023   Procedure: TRANSMETATARSAL AMPUTATION;  Surgeon: Janit Thresa HERO, DPM;  Location: [facility];  Service: Orthopedics/Podiatry;  Laterality: Left;    Allergies  Allergen Reactions   Latex Other (See Comments)  Blister and burning (12M insulin  pump latex band aid )   Nitroglycerin  Nausea Only and Other (See Comments)    Patches only - headache    Statins Other (See Comments)    BODY PAIN, Pt taking Crestor *      Fluvastatin Other (See Comments)    Muscle pain   Fluvastatin Sodium Other (See Comments)    Myalgias   Percocet [Oxycodone -Acetaminophen ] Itching   Rosuvastatin  Other (See Comments)    Muscle pain, Myalgias   Simvastatin Other (See Comments)    Muscle pain, Myalgias   Lipitor [Atorvastatin] Other (See Comments) and Rash    BODY PAIN   Lisinopril  Other (See Comments) and Nausea Only    Light headed, dizzy Other reaction(s): Dizziness   Zetia [Ezetimibe] Other (See Comments)    Muscle Pain    LT foot 03/01/2024   RT third toe 03/22/2024   RT third toe 04/29/2024   RT third toes 05/17/2024   LT foot 05/17/2024  Objective/Physical Exam Wound noted to the amputation stump of the right third digit.  Stable.   Measures approximately 0.8 time 0.8 x 0.1 cm.  Granular wound base.  No exposed bone.  Draining sinus noted to the plantar aspect of the left foot amputation stump underlying the first ray.  Unchanged essentially from last visit with the exception of there is some slight increased erythema and edema diffusely throughout the foot.  Vascular: Status post angiogram with intervention on 02/23/2024.  Dr. Cordella Shawl [facility] Procedure:  Percutaneous transluminal angioplasty and stent placement within [place]rit 2.5 mm stent left anterior tibial artery              2.    Percutaneous transluminal angioplasty to 3 mm left posterior tibial artery and tibioperoneal trunk  Radiographic exam LT foot 05/17/2024: Osteotomies across the metatarsals noted.  Radiolucency around the distal aspect of the first metatarsal osteotomy site.  This is likely the area of serous fluid buildup.  It does not seem to track proximal and is not consistent with gas gangrene clinically or radiographically  Component Ref Range & Units (hover) 13 d ago  MICRO NUMBER: [id number]  SPECIMEN QUALITY: Adequate  SOURCE: NOT GIVEN  STATUS: FINAL  GRAM STAIN: No white blood cells seen No epithelial cells seen No organisms seen  RESULT: Growth of skin flora (note: Growth does not include S. aureus, beta-hemolytic Streptococci or P. aeruginosa).  COMMENT: No source was provided. The specimen was tested and reported based upon the test code ordered. If this is incorrect, please contact client services.  Resulting Agency [facility]     Assessment: 1. s/p TMA w/ TAL LT. Partial 3rd toe amputation RT. DOS: 12/04/2023 2.  Ulcer right third toe fat layer exposed secondary to diabetes mellitus w/ peripheral polyneuropathy 3.  History of PVD bilateral lower extremities 4.  Draining sinus/ulcer plantar aspect of the left foot amputation stump; stable 5.  Mild cellulitis left foot   Plan of Care:  -Patient was evaluated.   Cultures reviewed last visit negative for growth left foot draining sinus - Medically necessary excisional debridement including subcutaneous tissue was performed today to the right third toe amputation stump.  Excisional debridement of the necrotic nonviable tissue down to healthier bleeding viable tissue was performed with postdebridement measurement same as pre- -Debridement of the overlying callus to the plantar aspect of the left foot was performed today and heavy serous straw-colored drainage was expressed from the area.  Cultures were taken and sent to  pathology for culture and sensitivity. - Order for MRI left foot placed on 05/18/2024.  Patient states that they have not received a phone call to schedule.  I provided them with the order requisition (printed) and they will take it to [facility] and potentially set up an appointment for this -Due to the mild erythema I did call in a prescription for doxycycline  100 mg twice daily x 10 days - Return to clinic after MRI to review results and discuss further treatment options    Thresa EMERSON Sar, DPM [facility]  Dr. Thresa EMERSON Sar, DPM    [address] [place], [place] [address]                Office [phone]  Fax [phone]

## 2024-06-02 ENCOUNTER — Ambulatory Visit: Admitting: Physical Therapy

## 2024-06-02 ENCOUNTER — Encounter: Payer: Self-pay | Admitting: Physical Therapy

## 2024-06-02 DIAGNOSIS — M542 Cervicalgia: Secondary | ICD-10-CM

## 2024-06-02 DIAGNOSIS — G8929 Other chronic pain: Secondary | ICD-10-CM

## 2024-06-02 DIAGNOSIS — M25512 Pain in left shoulder: Secondary | ICD-10-CM | POA: Diagnosis not present

## 2024-06-02 NOTE — Therapy (Signed)
 OUTPATIENT PHYSICAL THERAPY SHOULDER DISCHARGE      Patient Name: Lawrence [name] MRN: [id number] DOB:1950/11/06, 73 y.o., male Today's Date: 06/02/2024  END OF SESSION:  PT End of Session - 06/02/24 1305     Visit Number 3    Number of Visits 24    Date for PT Re-Evaluation 08/08/24    Authorization Type Medicare 2025    Authorization - Visit Number 3    Authorization - Number of Visits 24    Progress Note Due on Visit 10    PT Start Time 1303    PT Stop Time 1345    PT Time Calculation (min) 42 min    Activity Tolerance Patient tolerated treatment well    Behavior During Therapy WFL for tasks assessed/performed          Past Medical History:  Diagnosis Date   Allergies    Anginal pain (HCC)    Bilateral carotid artery disease (HCC)    Breast pain, left 08/29/2022   CAD (coronary artery disease) 07/2007   a.) s/p NSTEMI 07/2007 - sig multi-vessel CAD not amenable to PCI; b.) LHC 06/11/2016: multi vessel CAD not amenable to PCI --> refer to CVTS; c.) s/p 4v CABG 06/27/2016; d.) LHC 06/09/2023: 75% mLM, 80% o-pLAD, 60% mLAD, 100% p-mLCx, 100% OM1, 95% pRCA, 100% mRCA, 50% m-dLAD. LIMA-LAD and SVG-OM1 patent. CTO SVG-RCA with L-R collaterals - med mgmt   Carpal tunnel syndrome on left    a.) s/p release 04/2016   CKD (chronic kidney disease), stage III (HCC)    COVID-19 12/09/2022   Diabetic peripheral neuropathy (HCC)    Diastolic dysfunction    a.) TTE 06/25/2016: EF 60-654%. no RWMAs, G1DD, mild AoV annular calc; b.) TTE 03/19/2017: EF 50-55%, no RWMAs, mild MR; c.) TTE 07/22/2019: EF 55-60%, midl LVH, no RWMAs, triv MR, mild TR   Dizziness 04/30/2021   Dyspnea    GERD (gastroesophageal reflux disease)    Glaucoma    Headache    History of bilateral cataract extraction    HLD (hyperlipidemia)    HTN (hypertension)    Long term current use of clopidogrel     Long-term use of aspirin  therapy    MRSA (methicillin resistant staph aureus) culture positive    2006 -  2008   Myocardial infarction (HCC) 04/04/2016   NSTEMI (non-ST elevated myocardial infarction) (HCC) 07/27/2007   a.) LHC 07/30/2007: 30% mLAD, 99% D1, 50% pLCx, 40% mLCx, 70/75/85% dLCx, 50% OM1, 75/80/80 OM2, 50% pRCA, 60% mRCA, 85% dRCA --> not amenable to PCI   OSA on CPAP    Osteoarthritis    PAD (peripheral artery disease) (HCC)    a. 03/2022 Lower Ext Angio: No signif Ao-iliac dzs. R PT diff dzs and occluded above ankle. R Pedal arch intact w/ excellent antegrade flow provided by R AT-->no indication for revasc.   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   a.) LIMA-LAD, SVG-RCA, seq SVG-OM1-OM2   S/P tube myringotomy    Skin cancer, basal cell    Skin ulcer of right great toe, unspecified ulcer stage (HCC)    T2DM (type 2 diabetes mellitus) (HCC)    Wears glasses    Wears partial dentures    top   Past Surgical History:  Procedure Laterality Date   ABDOMINAL AORTOGRAM W/LOWER EXTREMITY N/A 03/26/2022   Procedure: ABDOMINAL AORTOGRAM W/LOWER EXTREMITY;  Surgeon: Darron Deatrice LABOR, MD;  Location: [facility];  Service:  Cardiovascular;  Laterality: N/A;   AMPUTATION TOE Bilateral 06/26/2023   Procedure: AMPUTATION TOE MPJ OF THE JOINT,PARTIAL TOE AMPUTUATION;  Surgeon: Janit Thresa HERO, DPM;  Location: [facility];  Service: Orthopedics/Podiatry;  Laterality: Bilateral;   AMPUTATION TOE Right 12/04/2023   Procedure: AMPUTATION TOE INTERPHALANGEAL 3RD;  Surgeon: Janit Thresa HERO, DPM;  Location: [facility];  Service: Orthopedics/Podiatry;  Laterality: Right;   APPENDECTOMY     CARDIAC CATHETERIZATION N/A 06/11/2016   Procedure: Left Heart Cath and Coronary Angiography;  Surgeon: Wolm JINNY Rhyme, MD;  Location: [facility];  Service: Cardiovascular;  Laterality: N/A;   CARPAL TUNNEL RELEASE Left 04/24/2016   Procedure: CARPAL TUNNEL RELEASE;  Surgeon: Ozell Flake, MD;  Location: [facility];  Service: Orthopedics;  Laterality: Left;   CATARACT  EXTRACTION W/ INTRAOCULAR LENS  IMPLANT, BILATERAL Bilateral    CATARACT EXTRACTION, BILATERAL     R eye 07/16/12, L eye 08/13/12 - with lens implant in both eyes   CORONARY ARTERY BYPASS GRAFT N/A 06/27/2016   Procedure: CORONARY ARTERY BYPASS GRAFTING times four using left internal mammary artery and right leg saphenous vein;  Surgeon: Maude Fleeta Ochoa, MD;  Location: [facility] OR;  Service: Open Heart Surgery;  Laterality: N/A;   FOREARM FRACTURE SURGERY Left 1961   KNEE ARTHROPLASTY Left 02/11/2016   Procedure: COMPUTER ASSISTED TOTAL KNEE ARTHROPLASTY;  Surgeon: Lynwood SHAUNNA Hue, MD;  Location: [facility];  Service: Orthopedics;  Laterality: Left;   KNEE ARTHROSCOPY Right 2000   LEFT HEART CATH AND CORONARY ANGIOGRAPHY Left 07/30/2007   Procedure: LEFT HEART CATH AND CORONARY ANGIOGRAPHY; Location: [facility]; Surgeon: Wolm Rhyme, MD   LEFT HEART CATH AND CORS/GRAFTS ANGIOGRAPHY Left 01/13/2017   Procedure: Left Heart Cath and Cors/Grafts Angiography;  Surgeon: Wolm JINNY Rhyme, MD;  Location: [facility];  Service: Cardiovascular;  Laterality: Left;   LEFT HEART CATH AND CORS/GRAFTS ANGIOGRAPHY Left 06/09/2023   Procedure: LEFT HEART CATH AND CORS/GRAFTS ANGIOGRAPHY;  Surgeon: Ammon Blunt, MD;  Location: [facility];  Service: Cardiovascular;  Laterality: Left;   LOWER EXTREMITY ANGIOGRAPHY Left 02/23/2024   Procedure: Lower Extremity Angiography;  Surgeon: Jama Cordella MATSU, MD;  Location: [facility];  Service: Cardiovascular;  Laterality: Left;   LOWER EXTREMITY ANGIOGRAPHY Right 05/03/2024   Procedure: Lower Extremity Angiography;  Surgeon: Jama Cordella MATSU, MD;  Location: [facility];  Service: Cardiovascular;  Laterality: Right;   LOWER EXTREMITY INTERVENTION Left 02/23/2024   Procedure: LOWER EXTREMITY INTERVENTION;  Surgeon: Jama Cordella MATSU, MD;  Location: [facility];  Service: Cardiovascular;  Laterality: Left;   METATARSAL HEAD EXCISION  Right 03/21/2020   Procedure: METATARSAL HEAD RESECTION RIGHT AND DEBRIDEMENT OF ULCER;  Surgeon: Janit Thresa HERO, DPM;  Location: [facility];  Service: Podiatry;  Laterality: Right;   METATARSAL HEAD EXCISION Left 09/15/2023   Procedure: METATARSAL HEAD EXCISION;  Surgeon: Janit Thresa HERO, DPM;  Location: [facility];  Service: Orthopedics/Podiatry;  Laterality: Left;   MULTIPLE TOOTH EXTRACTIONS     TEE WITHOUT CARDIOVERSION N/A 06/27/2016   Procedure: TRANSESOPHAGEAL ECHOCARDIOGRAM (TEE);  Surgeon: Maude Fleeta Ochoa, MD;  Location: [facility] OR;  Service: Open Heart Surgery;  Laterality: N/A;   TENDON LENGTHENING Left 12/04/2023   Procedure: TENDON-ACHILLES LENGTHENING;  Surgeon: Janit Thresa HERO, DPM;  Location: [facility];  Service: Orthopedics/Podiatry;  Laterality: Left;   TRANSMETATARSAL AMPUTATION Left 12/04/2023   Procedure: TRANSMETATARSAL AMPUTATION;  Surgeon: Janit Thresa HERO, DPM;  Location: [facility];  Service: Orthopedics/Podiatry;  Laterality: Left;   Patient Active Problem List   Diagnosis Date Noted   Atherosclerosis of native arteries of the extremities with ulceration (HCC) 01/27/2024   Pain in joint, lower leg 09/05/2022   Left-sided chest wall pain 08/29/2022   Stage 3a chronic kidney disease (HCC) 12/26/2021   Chest tightness 03/22/2021   History of MI (myocardial infarction) 07/11/2020   Ulcer of left foot with fat layer exposed (HCC) 03/08/2020   Actinic keratosis 02/26/2020   Allergic rhinitis 02/26/2020   Basal cell carcinoma of skin 02/26/2020   ED (erectile dysfunction) of organic origin 02/26/2020   Hypersomnia with sleep apnea 02/26/2020   Low back pain 02/26/2020   Other specified malignant neoplasm of skin, site unspecified 02/26/2020   Other anxiety states 02/26/2020   Psychosexual dysfunction with inhibited sexual excitement 02/26/2020   Senile cataract 02/26/2020   Medicare annual wellness visit, subsequent 04/26/2019   Frequent headaches 11/04/2018   Osteoarthritis  09/29/2017   Stable angina (HCC) 04/22/2017   Bilateral carotid artery stenosis 12/10/2016   S/P CABG (coronary artery bypass graft) 06/27/2016   Type 1 diabetes mellitus with hyperglycemia (HCC) 04/04/2016   Combined fat and carbohydrate induced hyperlipemia 04/04/2016   H/O total knee replacement 03/28/2016   S/P total knee arthroplasty 02/11/2016   Arthritis of knee, degenerative 11/05/2015   Hyperlipidemia 08/28/2015   CAD (coronary artery disease) 08/28/2015   Lower extremity edema 08/28/2015   GERD (gastroesophageal reflux disease) 08/28/2015   OSA (obstructive sleep apnea) 08/28/2015   Morbid obesity with BMI of 40.0-44.9, adult (HCC) 08/28/2015   Benign essential HTN 05/18/2015    PCP:  Comer Gaskins NP   REFERRING PROVIDER: Dr. Jacques Mallet   REFERRING DIAG:   M19.012 (ICD-10-CM) - Glenohumeral arthritis, left M25.512,G89.29 (ICD-10-CM) - Chronic left shoulder pain  THERAPY DIAG:  Cervicalgia  Chronic left shoulder pain  Rationale for Evaluation and Treatment: Rehabilitation  ONSET DATE: 03/03/24    SUBJECTIVE:                                                                                                                                                                                      SUBJECTIVE STATEMENT: Pt reports that he continues to be sleeping well with pillow support. He is still concerned about his balance especially given that he is missing most of his toes from diabetic ulcers.   Hand dominance: Right  PERTINENT HISTORY: Pt reports experiencing a sharp pain in shoulder blade and around posterior deltoid especially when laying flat on his back or laying down on his incline pillow. This pain started within the past 30 to 60 days. He does not experience this shoulder pain  when sitting up. He has tried using an a pillow on top of his wedge and this provided additional relief to the point where he was able to sleep in bed for first time in a while last  night. 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 He describes his family having a history of arthritis including rheumatoid arthritis.   PAIN:  Are you having pain? Yes: NPRS scale: 7-8/10   Pain location: Left tricep and left periscapular musculature   Pain description: Sharp and shooting   Aggravating factors: Laying flat on his back   Relieving factors: Tylenol  and Voltaren.    PRECAUTIONS: None  RED FLAGS: Cervical red flags: none reported but will follow up in more detail next session.   WEIGHT BEARING RESTRICTIONS: No  FALLS:  Has patient fallen in last 6 months? No; he is a afraid of falling though   LIVING ENVIRONMENT: Lives with: lives with their spouse Lives in: House/apartment Stairs: Yes: External: 7 steps back entrance, and  front 3 steps; on right going up and on left going up Has following equipment at home: Single point cane and walker    OCCUPATION: Retired     PLOF: Independent  PATIENT GOALS:To be able to fall asleep better without being kept awake by left shoulder pain.   NEXT MD VISIT: Did not ask   OBJECTIVE:  Note: Objective measures were completed at Evaluation unless otherwise noted.  VITALS: BP 123/57 HR 66 SpO2 98%  DIAGNOSTIC FINDINGS:  CLINICAL DATA:  Pain and loss of motion.   EXAM: LEFT SHOULDER - 2+ VIEW   COMPARISON:  None Available.   FINDINGS: Mild glenohumeral joint space narrowing and inferior glenoid degenerative spurring. Moderate acromioclavicular joint space narrowing and peripheral osteophytosis. Minimal lateral downsloping of the acromion. Partial visualization of median sternotomy and CABG. No acute fracture or dislocation.   IMPRESSION: Mild glenohumeral and moderate acromioclavicular osteoarthritis.     Electronically Signed   By: Tanda Lyons M.D.   On: 05/03/2024 10:10  PATIENT SURVEYS:  Neck Disability Index: 22/50  (44%)  COGNITION: Overall cognitive status: Within functional limits for tasks assessed     SENSATION: WFL  POSTURE: Forward head and rounded shoulders      CERVICAL A/PROM            Flex 35            Ext 20*- pt reports concordant symptoms with pain radiating down left shoulder              SB R/L 25/25              Rot R/L  40/40    UPPER EXTREMITY ROM:   Active ROM Right eval Left eval  Shoulder flexion 160 160  Shoulder extension    Shoulder abduction 160 160   Shoulder adduction    Shoulder internal rotation    Shoulder external rotation    Elbow flexion    Elbow extension    Wrist flexion    Wrist extension    Wrist ulnar deviation    Wrist radial deviation    Wrist pronation    Wrist supination    (Blank rows = not tested)  Combined Shoulder ER                  Occiput          Occiput   Combined Shoulder IR  PSIS             PSIS   UPPER EXTREMITY MMT:  MMT Right eval Left eval  Shoulder flexion 4 4  Shoulder extension 4+ 4+  Shoulder abduction 4 4  Shoulder adduction    Shoulder internal rotation at 0 deg  4 4  Shoulder external rotation at 0 deg  4 4  Middle trapezius 4 4  Lower trapezius 4 4  Elbow flexion    Elbow extension    Wrist flexion    Wrist extension    Wrist ulnar deviation    Wrist radial deviation    Wrist pronation    Wrist supination    Grip strength (lbs)    (Blank rows = not tested)  SHOULDER SPECIAL TESTS: Impingement tests: Painful arc test: negative Rotator cuff assessment: Full can test: negative and Infraspinatus test: negative    PALPATION:  Left tricep and rhomboid  TTP                                                                                                                              TREATMENT DATE:   06/02/24: THEREX   Shoulder Horizontal Abduction AROM 2 x 10   Seated Shoulder Extension PROM against chair back 2 x 10     Bent Over Shoulder Y's with 1 UE support 2 x 10     SELF CARE HOME MANAGEMENT   Review progressions for exercises by adding resistance and reviewed sets and reps     PATIENT EDUCATION: Education details: Form and technique for correct performance of exercise and explanation about underlying cervical pathology   Person educated: Patient Education method: Explanation, Demonstration, Verbal cues, and Handouts Education comprehension: verbalized understanding, returned demonstration, verbal cues required, and tactile cues required  HOME EXERCISE PROGRAM: Access Code: [email or web address] URL: [email or web address] Date: 06/02/2024 Prepared by: Toribio Servant  Exercises - Seated Upper Trapezius Stretch  - 1 x daily - 7 x weekly - 3 reps - 60 sec  hold - Seated Thoracic Lumbar Extension  - 1 x daily - 7 x weekly - 2 sets - 10 reps - 3 sec  hold - Doorway Pec Stretch at 60 Elevation  - 1 x daily - 7 x weekly - 3 reps - 30-60 sec hold - Seated Cervical Retraction  - 1 x daily - 7 x weekly - 2 sets - 10 reps - 2-3 sec  hold - Seated Shoulder Row with Anchored Resistance  - 3-4 x weekly - 3 sets - 10 reps - Seated Shoulder Horizontal Abduction - Thumbs Up  - 3-4 x weekly - 3 sets - 10 reps - Shoulder Bent over Y's   - 3-4 x weekly - 3 sets - 10 reps  ASSESSMENT:  CLINICAL IMPRESSION: Despite not reaching his rehab goals, pt has had considerable relief in his left sided neck pain and radicular symptoms, which has enabled him to sleep better. Pt will continue with home  exercise program independently to improve periscapular strength and cervical ROM and continue to utilize pillow to provide support with cervical flexion. He is now ready for discharge.     OBJECTIVE IMPAIRMENTS: decreased knowledge of condition, decreased ROM, decreased strength, hypomobility, impaired UE functional use, postural dysfunction, obesity, and pain.   ACTIVITY LIMITATIONS: carrying, lifting, sleeping, bathing, dressing, reach over head, and  hygiene/grooming  PARTICIPATION LIMITATIONS: cleaning, laundry, interpersonal relationship, driving, shopping, community activity, and yard work  PERSONAL FACTORS: Age, Fitness, and 3+ comorbidities: T2DM, are also affecting patient's functional outcome.   REHAB POTENTIAL: Good  CLINICAL DECISION MAKING: Stable/uncomplicated  EVALUATION COMPLEXITY: Low   GOALS: Goals reviewed with patient? No  SHORT TERM GOALS: Target date: 05/30/2024  Patient will demonstrate undestanding of home exercise plan by performing exercises correctly with evidence of good carry over with min to no verbal or tactile cues .   Baseline: NT  05/19/24 Performing independently  Goal status: ACHIEVED    2.  Patient will develop night time cervical spine position to avoid cervical extension like correct positioning through purchasing cervical pillow or using his own equipment and demonstrating this understanding.  Baseline: NT  05/19/24 Able to adjust position for improved cervical flexion  Goal status: ACHIEVED    LONG TERM GOALS: Target date: 08/08/2024  Patient will show a statistically significant improvement in her neck function as evidence by >=7.5 pt decrease in her neck disability index score to better be able to move neck to improve visual field while driving to avoid accidents. Vernel et al, 2009)                 Baseline: 22/50 (44%)                 Goal status: Deferred   2.  Patient will increase cervical AROM to be near or within normal range of motion for improved cervical function and pain response to movement to have improved sleep.  Baseline: Cervical Ext 20, Cervical Flex 35, Cervical SB R/L 25/25 Goal status: Deferred   3.  Patient will improve periscapular strength by 1/3 grade MMT (ie 4- to 4) for improved cervical stability and improved posture with decreased forward head and rounded shoulders. Baseline: Mid Trap 4/4, Low Trap 4/4, Shoulder Ext R 4+/4+ Goal status: Deferred    4.   Patient will be able to sleep without experiencing left sided cervical pain >=3/10 NRPS as evidence of improved cervical function and to improve sleep quality.  Baseline: 7-8/10 NRPS  Goal status: Deferred     PLAN:  PT FREQUENCY: 1-2x/week  PT DURATION: 12 weeks  PLANNED INTERVENTIONS: 97164- PT Re-evaluation, 97750- Physical Performance Testing, 97110-Therapeutic exercises, 97530- Therapeutic activity, 97112- Neuromuscular re-education, 97535- Self Care, 02859- Manual therapy, [phone]- Aquatic Therapy, [id number]- Electrical stimulation (unattended), [phone]- Electrical stimulation (manual), 20560 (1-2 muscles), 20561 (3+ muscles)- Dry Needling, Patient/Family education, Joint mobilization, Joint manipulation, Spinal manipulation, Spinal mobilization, Scar mobilization, Visual/preceptual remediation/compensation, Cryotherapy, and Moist heat  PLAN FOR NEXT SESSION:  Discharge from PT   Toribio Servant PT, DPT  [facility] [address], [place], [address] Phone: [phone]   Fax:  [phone]

## 2024-06-06 ENCOUNTER — Ambulatory Visit
Admission: RE | Admit: 2024-06-06 | Discharge: 2024-06-06 | Disposition: A | Discharge status: Home / Self Care | Source: Ambulatory Visit | Attending: Podiatry | Admitting: Podiatry

## 2024-06-06 DIAGNOSIS — L97522 Non-pressure chronic ulcer of other part of left foot with fat layer exposed: Secondary | ICD-10-CM

## 2024-06-07 ENCOUNTER — Ambulatory Visit: Admitting: Physical Therapy

## 2024-06-09 ENCOUNTER — Encounter: Admitting: Physical Therapy

## 2024-06-09 LAB — BASIC METABOLIC PANEL WITH GFR
BUN: 20 (ref 4–21)
CO2: 32 — AB (ref 13–22)
Chloride: 101 (ref 99–108)
Creatinine: 1.3 (ref 0.6–1.3)
Glucose: 150
Potassium: 4.5 meq/L (ref 3.5–5.1)
Sodium: 141 (ref 137–147)

## 2024-06-09 LAB — HEPATIC FUNCTION PANEL
ALT: 10 U/L (ref 10–40)
AST: 14 (ref 14–40)

## 2024-06-09 LAB — HEMOGLOBIN A1C
Creatinine, POC: 35.6 mg/dL
EGFR: 57.1
Hemoglobin A1C: 7.9
Microalbumin, Urine: <0.3

## 2024-06-09 LAB — LIPID PANEL
Cholesterol: 129 (ref 0–200)
HDL: 63 (ref 35–70)
LDL Cholesterol: 55
Triglycerides: 56 (ref 40–160)

## 2024-06-09 LAB — MICROALBUMIN, URINE: Microalb, Ur: <0.3

## 2024-06-09 LAB — PROTEIN / CREATININE RATIO, URINE: Creatinine, Urine: 35.6

## 2024-06-09 LAB — COMPREHENSIVE METABOLIC PANEL WITH GFR
Calcium: 9.7 (ref 8.7–10.7)
eGFR: 57.1

## 2024-06-14 ENCOUNTER — Encounter: Admitting: Physical Therapy

## 2024-06-16 ENCOUNTER — Encounter: Admitting: Physical Therapy

## 2024-06-20 ENCOUNTER — Encounter (INDEPENDENT_AMBULATORY_CARE_PROVIDER_SITE_OTHER)

## 2024-06-20 ENCOUNTER — Ambulatory Visit (INDEPENDENT_AMBULATORY_CARE_PROVIDER_SITE_OTHER): Admitting: Vascular Surgery

## 2024-06-21 ENCOUNTER — Encounter: Payer: Self-pay | Admitting: Podiatry

## 2024-06-21 ENCOUNTER — Encounter: Admitting: Physical Therapy

## 2024-06-21 ENCOUNTER — Ambulatory Visit (INDEPENDENT_AMBULATORY_CARE_PROVIDER_SITE_OTHER): Admitting: Podiatry

## 2024-06-21 VITALS — Ht 70.0 in | Wt 296.5 lb

## 2024-06-21 DIAGNOSIS — M86472 Chronic osteomyelitis with draining sinus, left ankle and foot: Secondary | ICD-10-CM | POA: Diagnosis not present

## 2024-06-21 NOTE — H&P (View-Only) (Signed)
 Chief Complaint  Patient presents with   Wound Check    Pt is here to f/u on bilateral feet due to ulcers and discuss MRI results.    Subjective:  Patient presents today for follow-up evaluation of an ulcer to the right third digit partial toe amputation site as well as a draining sinus to the left foot.   MRI completed and presenting to review the MRI results and discuss further treatment options  PSxHx TMA with TAL left.  Partial toe amputation RT third.  DOS: 12/04/2023.     Past Medical History:  Diagnosis Date   Allergies    Anginal pain (HCC)    Bilateral carotid artery disease (HCC)    Breast pain, left 08/29/2022   CAD (coronary artery disease) 07/2007   a.) s/p NSTEMI 07/2007 - sig multi-vessel CAD not amenable to PCI; b.) LHC 06/11/2016: multi vessel CAD not amenable to PCI --> refer to CVTS; c.) s/p 4v CABG 06/27/2016; d.) LHC 06/09/2023: 75% mLM, 80% o-pLAD, 60% mLAD, 100% p-mLCx, 100% OM1, 95% pRCA, 100% mRCA, 50% m-dLAD. LIMA-LAD and SVG-OM1 patent. CTO SVG-RCA with L-R collaterals - med mgmt   Carpal tunnel syndrome on left    a.) s/p release 04/2016   CKD (chronic kidney disease), stage III (HCC)    COVID-19 12/09/2022   Diabetic peripheral neuropathy (HCC)    Diastolic dysfunction    a.) TTE 06/25/2016: EF 60-654%. no RWMAs, G1DD, mild AoV annular calc; b.) TTE 03/19/2017: EF 50-55%, no RWMAs, mild MR; c.) TTE 07/22/2019: EF 55-60%, midl LVH, no RWMAs, triv MR, mild TR   Dizziness 04/30/2021   Dyspnea    GERD (gastroesophageal reflux disease)    Glaucoma    Headache    History of bilateral cataract extraction    HLD (hyperlipidemia)    HTN (hypertension)    Long term current use of clopidogrel     Long-term use of aspirin  therapy    MRSA (methicillin resistant staph aureus) culture positive    2006 - 2008   Myocardial infarction (HCC) 04/04/2016   NSTEMI (non-ST elevated myocardial infarction) (HCC) 07/27/2007   a.) LHC 07/30/2007: 30% mLAD, 99% D1, 50%  pLCx, 40% mLCx, 70/75/85% dLCx, 50% OM1, 75/80/80 OM2, 50% pRCA, 60% mRCA, 85% dRCA --> not amenable to PCI   OSA on CPAP    Osteoarthritis    PAD (peripheral artery disease) (HCC)    a. 03/2022 Lower Ext Angio: No signif Ao-iliac dzs. R PT diff dzs and occluded above ankle. R Pedal arch intact w/ excellent antegrade flow provided by R AT-->no indication for revasc.   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   a.) LIMA-LAD, SVG-RCA, seq SVG-OM1-OM2   S/P tube myringotomy    Skin cancer, basal cell    Skin ulcer of right great toe, unspecified ulcer stage (HCC)    T2DM (type 2 diabetes mellitus) (HCC)    Wears glasses    Wears partial dentures    top    Past Surgical History:  Procedure Laterality Date   ABDOMINAL AORTOGRAM W/LOWER EXTREMITY N/A 03/26/2022   Procedure: ABDOMINAL AORTOGRAM W/LOWER EXTREMITY;  Surgeon: Darron Deatrice LABOR, MD;  [facility]: [facility];  Service: Cardiovascular;  Laterality: N/A;   AMPUTATION TOE Bilateral 06/26/2023   Procedure: AMPUTATION TOE MPJ OF THE JOINT,PARTIAL TOE AMPUTUATION;  Surgeon: Janit Thresa HERO, DPM;  Location: [facility];  Service: Orthopedics/Podiatry;  Laterality: Bilateral;   AMPUTATION TOE Right 12/04/2023  Procedure: AMPUTATION TOE INTERPHALANGEAL 3RD;  Surgeon: Janit Thresa HERO, DPM;  Location: [facility];  Service: Orthopedics/Podiatry;  Laterality: Right;   APPENDECTOMY     CARDIAC CATHETERIZATION N/A 06/11/2016   Procedure: Left Heart Cath and Coronary Angiography;  Surgeon: Wolm JINNY Rhyme, MD;  Location: [facility];  Service: Cardiovascular;  Laterality: N/A;   CARPAL TUNNEL RELEASE Left 04/24/2016   Procedure: CARPAL TUNNEL RELEASE;  Surgeon: Ozell Flake, MD;  Location: [facility];  Service: Orthopedics;  Laterality: Left;   CATARACT EXTRACTION W/ INTRAOCULAR LENS  IMPLANT, BILATERAL Bilateral    CATARACT EXTRACTION, BILATERAL     R eye 07/16/12, L eye 08/13/12 - with lens implant in both  eyes   CORONARY ARTERY BYPASS GRAFT N/A 06/27/2016   Procedure: CORONARY ARTERY BYPASS GRAFTING times four using left internal mammary artery and right leg saphenous vein;  Surgeon: Maude Fleeta Ochoa, MD;  Location: [facility] OR;  Service: Open Heart Surgery;  Laterality: N/A;   FOREARM FRACTURE SURGERY Left 1961   KNEE ARTHROPLASTY Left 02/11/2016   Procedure: COMPUTER ASSISTED TOTAL KNEE ARTHROPLASTY;  Surgeon: Lynwood SHAUNNA Hue, MD;  Location: [facility];  Service: Orthopedics;  Laterality: Left;   KNEE ARTHROSCOPY Right 2000   LEFT HEART CATH AND CORONARY ANGIOGRAPHY Left 07/30/2007   Procedure: LEFT HEART CATH AND CORONARY ANGIOGRAPHY; Location: [facility]; Surgeon: Wolm Rhyme, MD   LEFT HEART CATH AND CORS/GRAFTS ANGIOGRAPHY Left 01/13/2017   Procedure: Left Heart Cath and Cors/Grafts Angiography;  Surgeon: Wolm JINNY Rhyme, MD;  Location: [facility];  Service: Cardiovascular;  Laterality: Left;   LEFT HEART CATH AND CORS/GRAFTS ANGIOGRAPHY Left 06/09/2023   Procedure: LEFT HEART CATH AND CORS/GRAFTS ANGIOGRAPHY;  Surgeon: Ammon Blunt, MD;  Location: [facility];  Service: Cardiovascular;  Laterality: Left;   LOWER EXTREMITY ANGIOGRAPHY Left 02/23/2024   Procedure: Lower Extremity Angiography;  Surgeon: Jama Cordella MATSU, MD;  Location: [facility];  Service: Cardiovascular;  Laterality: Left;   LOWER EXTREMITY ANGIOGRAPHY Right 05/03/2024   Procedure: Lower Extremity Angiography;  Surgeon: Jama Cordella MATSU, MD;  Location: [facility];  Service: Cardiovascular;  Laterality: Right;   LOWER EXTREMITY INTERVENTION Left 02/23/2024   Procedure: LOWER EXTREMITY INTERVENTION;  Surgeon: Jama Cordella MATSU, MD;  Location: [facility];  Service: Cardiovascular;  Laterality: Left;   METATARSAL HEAD EXCISION Right 03/21/2020   Procedure: METATARSAL HEAD RESECTION RIGHT AND DEBRIDEMENT OF ULCER;  Surgeon: Janit Thresa HERO, DPM;  Location: [facility];  Service: Podiatry;   Laterality: Right;   METATARSAL HEAD EXCISION Left 09/15/2023   Procedure: METATARSAL HEAD EXCISION;  Surgeon: Janit Thresa HERO, DPM;  Location: [facility];  Service: Orthopedics/Podiatry;  Laterality: Left;   MULTIPLE TOOTH EXTRACTIONS     TEE WITHOUT CARDIOVERSION N/A 06/27/2016   Procedure: TRANSESOPHAGEAL ECHOCARDIOGRAM (TEE);  Surgeon: Maude Fleeta Ochoa, MD;  Location: [facility] OR;  Service: Open Heart Surgery;  Laterality: N/A;   TENDON LENGTHENING Left 12/04/2023   Procedure: TENDON-ACHILLES LENGTHENING;  Surgeon: Janit Thresa HERO, DPM;  Location: [facility];  Service: Orthopedics/Podiatry;  Laterality: Left;   TRANSMETATARSAL AMPUTATION Left 12/04/2023   Procedure: TRANSMETATARSAL AMPUTATION;  Surgeon: Janit Thresa HERO, DPM;  Location: [facility];  Service: Orthopedics/Podiatry;  Laterality: Left;    Allergies  Allergen Reactions   Latex Other (See Comments)    Blister and burning (71M insulin  pump latex band aid )   Nitroglycerin  Nausea Only and Other (See Comments)    Patches only - headache  Statins Other (See Comments)    BODY PAIN, Pt taking Crestor *      Fluvastatin Other (See Comments)    Muscle pain   Fluvastatin Sodium Other (See Comments)    Myalgias   Percocet [Oxycodone -Acetaminophen ] Itching   Rosuvastatin  Other (See Comments)    Muscle pain, Myalgias   Simvastatin Other (See Comments)    Muscle pain, Myalgias   Lipitor [Atorvastatin] Other (See Comments) and Rash    BODY PAIN   Lisinopril  Other (See Comments) and Nausea Only    Light headed, dizzy Other reaction(s): Dizziness   Zetia [Ezetimibe] Other (See Comments)    Muscle Pain    LT foot 03/01/2024   RT third toe 03/22/2024   RT third toe 04/29/2024   RT third toes 05/17/2024   LT foot 05/17/2024  Objective/Physical Exam Wound noted to the amputation stump of the right third digit.  Stable.  Measures approximately 0.8 time 0.8 x 0.1 cm.  Granular wound base.  No exposed bone.  Draining sinus noted to the  plantar aspect of the left foot amputation stump underlying the first ray.  Unchanged essentially from last visit with the exception of there is some slight increased erythema and edema diffusely throughout the foot.  Vascular: Status post angiogram with intervention on 02/23/2024.  Dr. Cordella Shawl [facility] Procedure:  Percutaneous transluminal angioplasty and stent placement within Esprit 2.5 mm stent left anterior tibial artery              2.    Percutaneous transluminal angioplasty to 3 mm left posterior tibial artery and tibioperoneal trunk  Radiographic exam LT foot 05/17/2024: Osteotomies across the metatarsals noted.  Radiolucency around the distal aspect of the first metatarsal osteotomy site.  This is likely the area of serous fluid buildup.  It does not seem to track proximal and is not consistent with gas gangrene clinically or radiographically  Component Ref Range & Units (hover) 13 d ago  MICRO NUMBER: [id number]  SPECIMEN QUALITY: Adequate  SOURCE: NOT GIVEN  STATUS: FINAL  GRAM STAIN: No white blood cells seen No epithelial cells seen No organisms seen  RESULT: Growth of skin flora (note: Growth does not include S. aureus, beta-hemolytic Streptococci or P. aeruginosa).  COMMENT: No source was provided. The specimen was tested and reported based upon the test code ordered. If this is incorrect, please contact client services.  Resulting Agency [facility] [place]    Assessment: 1. s/p TMA w/ TAL LT. Partial 3rd toe amputation RT. DOS: 12/04/2023 2.  Ulcer right third toe fat layer exposed secondary to diabetes mellitus w/ peripheral polyneuropathy 3.  History of PVD bilateral lower extremities 4.  Draining sinus/ulcer plantar aspect of the left foot amputation stump; stable 5.  Mild cellulitis left foot; mostly resolved   Plan of Care:  -Patient was evaluated.  MRI reviewed in detail today  -Cultures reviewed last visit negative for growth left foot draining  sinus -Debridement of the overlying callus to the plantar aspect of the left foot was performed today and heavy serous straw-colored drainage was expressed from the area.  Cultures were taken and sent to pathology for culture and sensitivity. - Plan for surgery which will include incision and drainage of the left foot amputation stump.  Bone biopsy first metatarsal left.  Debridement of ulcer with graft application right third toe -Return to clinic 1 week postop   Thresa EMERSON Sar, DPM [facility]  Dr. Thresa EMERSON Sar, DPM  [address] [place], [place] [address]                Office [phone]  Fax [phone]

## 2024-06-21 NOTE — Progress Notes (Signed)
 Chief Complaint  Patient presents with   Wound Check    Pt is here to f/u on bilateral feet due to ulcers and discuss MRI results.    Subjective:  Patient presents today for follow-up evaluation of an ulcer to the right third digit partial toe amputation site as well as a draining sinus to the left foot.   MRI completed and presenting to review the MRI results and discuss further treatment options  PSxHx TMA with TAL left.  Partial toe amputation RT third.  DOS: 12/04/2023.     Past Medical History:  Diagnosis Date   Allergies    Anginal pain (HCC)    Bilateral carotid artery disease (HCC)    Breast pain, left 08/29/2022   CAD (coronary artery disease) 07/2007   a.) s/p NSTEMI 07/2007 - sig multi-vessel CAD not amenable to PCI; b.) LHC 06/11/2016: multi vessel CAD not amenable to PCI --> refer to CVTS; c.) s/p 4v CABG 06/27/2016; d.) LHC 06/09/2023: 75% mLM, 80% o-pLAD, 60% mLAD, 100% p-mLCx, 100% OM1, 95% pRCA, 100% mRCA, 50% m-dLAD. LIMA-LAD and SVG-OM1 patent. CTO SVG-RCA with L-R collaterals - med mgmt   Carpal tunnel syndrome on left    a.) s/p release 04/2016   CKD (chronic kidney disease), stage III (HCC)    COVID-19 12/09/2022   Diabetic peripheral neuropathy (HCC)    Diastolic dysfunction    a.) TTE 06/25/2016: EF 60-654%. no RWMAs, G1DD, mild AoV annular calc; b.) TTE 03/19/2017: EF 50-55%, no RWMAs, mild MR; c.) TTE 07/22/2019: EF 55-60%, midl LVH, no RWMAs, triv MR, mild TR   Dizziness 04/30/2021   Dyspnea    GERD (gastroesophageal reflux disease)    Glaucoma    Headache    History of bilateral cataract extraction    HLD (hyperlipidemia)    HTN (hypertension)    Long term current use of clopidogrel     Long-term use of aspirin  therapy    MRSA (methicillin resistant staph aureus) culture positive    2006 - 2008   Myocardial infarction (HCC) 04/04/2016   NSTEMI (non-ST elevated myocardial infarction) (HCC) 07/27/2007   a.) LHC 07/30/2007: 30% mLAD, 99% D1, 50%  pLCx, 40% mLCx, 70/75/85% dLCx, 50% OM1, 75/80/80 OM2, 50% pRCA, 60% mRCA, 85% dRCA --> not amenable to PCI   OSA on CPAP    Osteoarthritis    PAD (peripheral artery disease) (HCC)    a. 03/2022 Lower Ext Angio: No signif Ao-iliac dzs. R PT diff dzs and occluded above ankle. R Pedal arch intact w/ excellent antegrade flow provided by R AT-->no indication for revasc.   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   a.) LIMA-LAD, SVG-RCA, seq SVG-OM1-OM2   S/P tube myringotomy    Skin cancer, basal cell    Skin ulcer of right great toe, unspecified ulcer stage (HCC)    T2DM (type 2 diabetes mellitus) (HCC)    Wears glasses    Wears partial dentures    top    Past Surgical History:  Procedure Laterality Date   ABDOMINAL AORTOGRAM W/LOWER EXTREMITY N/A 03/26/2022   Procedure: ABDOMINAL AORTOGRAM W/LOWER EXTREMITY;  Surgeon: Darron Deatrice LABOR, MD;  Location: [facility];  Service: Cardiovascular;  Laterality: N/A;   AMPUTATION TOE Bilateral 06/26/2023   Procedure: AMPUTATION TOE MPJ OF THE JOINT,PARTIAL TOE AMPUTUATION;  Surgeon: Janit Thresa HERO, DPM;  Location: [facility];  Service: Orthopedics/Podiatry;  Laterality: Bilateral;   AMPUTATION TOE Right 12/04/2023  Procedure: AMPUTATION TOE INTERPHALANGEAL 3RD;  Surgeon: Janit Thresa HERO, DPM;  Location: [facility];  Service: Orthopedics/Podiatry;  Laterality: Right;   APPENDECTOMY     CARDIAC CATHETERIZATION N/A 06/11/2016   Procedure: Left Heart Cath and Coronary Angiography;  Surgeon: Wolm JINNY Rhyme, MD;  Location: [facility];  Service: Cardiovascular;  Laterality: N/A;   CARPAL TUNNEL RELEASE Left 04/24/2016   Procedure: CARPAL TUNNEL RELEASE;  Surgeon: Ozell Flake, MD;  Location: [facility];  Service: Orthopedics;  Laterality: Left;   CATARACT EXTRACTION W/ INTRAOCULAR LENS  IMPLANT, BILATERAL Bilateral    CATARACT EXTRACTION, BILATERAL     R eye 07/16/12, L eye 08/13/12 - with lens implant in both  eyes   CORONARY ARTERY BYPASS GRAFT N/A 06/27/2016   Procedure: CORONARY ARTERY BYPASS GRAFTING times four using left internal mammary artery and right leg saphenous vein;  Surgeon: Maude Fleeta Ochoa, MD;  Location: [facility] OR;  Service: Open Heart Surgery;  Laterality: N/A;   FOREARM FRACTURE SURGERY Left 1961   KNEE ARTHROPLASTY Left 02/11/2016   Procedure: COMPUTER ASSISTED TOTAL KNEE ARTHROPLASTY;  Surgeon: Lynwood SHAUNNA Hue, MD;  Location: [facility];  Service: Orthopedics;  Laterality: Left;   KNEE ARTHROSCOPY Right 2000   LEFT HEART CATH AND CORONARY ANGIOGRAPHY Left 07/30/2007   Procedure: LEFT HEART CATH AND CORONARY ANGIOGRAPHY; Location: [facility]; Surgeon: Wolm Rhyme, MD   LEFT HEART CATH AND CORS/GRAFTS ANGIOGRAPHY Left 01/13/2017   Procedure: Left Heart Cath and Cors/Grafts Angiography;  Surgeon: Wolm JINNY Rhyme, MD;  Location: [facility];  Service: Cardiovascular;  Laterality: Left;   LEFT HEART CATH AND CORS/GRAFTS ANGIOGRAPHY Left 06/09/2023   Procedure: LEFT HEART CATH AND CORS/GRAFTS ANGIOGRAPHY;  Surgeon: Ammon Blunt, MD;  Location: [facility];  Service: Cardiovascular;  Laterality: Left;   LOWER EXTREMITY ANGIOGRAPHY Left 02/23/2024   Procedure: Lower Extremity Angiography;  Surgeon: Jama Cordella MATSU, MD;  Location: [facility];  Service: Cardiovascular;  Laterality: Left;   LOWER EXTREMITY ANGIOGRAPHY Right 05/03/2024   Procedure: Lower Extremity Angiography;  Surgeon: Jama Cordella MATSU, MD;  Location: [facility];  Service: Cardiovascular;  Laterality: Right;   LOWER EXTREMITY INTERVENTION Left 02/23/2024   Procedure: LOWER EXTREMITY INTERVENTION;  Surgeon: Jama Cordella MATSU, MD;  Location: [facility];  Service: Cardiovascular;  Laterality: Left;   METATARSAL HEAD EXCISION Right 03/21/2020   Procedure: METATARSAL HEAD RESECTION RIGHT AND DEBRIDEMENT OF ULCER;  Surgeon: Janit Thresa HERO, DPM;  Location: [facility];  Service: Podiatry;   Laterality: Right;   METATARSAL HEAD EXCISION Left 09/15/2023   Procedure: METATARSAL HEAD EXCISION;  Surgeon: Janit Thresa HERO, DPM;  Location: [facility];  Service: Orthopedics/Podiatry;  Laterality: Left;   MULTIPLE TOOTH EXTRACTIONS     TEE WITHOUT CARDIOVERSION N/A 06/27/2016   Procedure: TRANSESOPHAGEAL ECHOCARDIOGRAM (TEE);  Surgeon: Maude Fleeta Ochoa, MD;  Location: [facility] OR;  Service: Open Heart Surgery;  Laterality: N/A;   TENDON LENGTHENING Left 12/04/2023   Procedure: TENDON-ACHILLES LENGTHENING;  Surgeon: Janit Thresa HERO, DPM;  Location: [facility];  Service: Orthopedics/Podiatry;  Laterality: Left;   TRANSMETATARSAL AMPUTATION Left 12/04/2023   Procedure: TRANSMETATARSAL AMPUTATION;  Surgeon: Janit Thresa HERO, DPM;  Location: [facility];  Service: Orthopedics/Podiatry;  Laterality: Left;    Allergies  Allergen Reactions   Latex Other (See Comments)    Blister and burning (71M insulin  pump latex band aid )   Nitroglycerin  Nausea Only and Other (See Comments)    Patches only - headache  Statins Other (See Comments)    BODY PAIN, Pt taking Crestor *      Fluvastatin Other (See Comments)    Muscle pain   Fluvastatin Sodium Other (See Comments)    Myalgias   Percocet [Oxycodone -Acetaminophen ] Itching   Rosuvastatin  Other (See Comments)    Muscle pain, Myalgias   Simvastatin Other (See Comments)    Muscle pain, Myalgias   Lipitor [Atorvastatin] Other (See Comments) and Rash    BODY PAIN   Lisinopril  Other (See Comments) and Nausea Only    Light headed, dizzy Other reaction(s): Dizziness   Zetia [Ezetimibe] Other (See Comments)    Muscle Pain    LT foot 03/01/2024   RT third toe 03/22/2024   RT third toe 04/29/2024   RT third toes 05/17/2024   LT foot 05/17/2024  Objective/Physical Exam Wound noted to the amputation stump of the right third digit.  Stable.  Measures approximately 0.8 time 0.8 x 0.1 cm.  Granular wound base.  No exposed bone.  Draining sinus noted to the  plantar aspect of the left foot amputation stump underlying the first ray.  Unchanged essentially from last visit with the exception of there is some slight increased erythema and edema diffusely throughout the foot.  Vascular: Status post angiogram with intervention on 02/23/2024.  Dr. Cordella Shawl [facility] Procedure:  Percutaneous transluminal angioplasty and stent placement within Esprit 2.5 mm stent left anterior tibial artery              2.    Percutaneous transluminal angioplasty to 3 mm left posterior tibial artery and tibioperoneal trunk  Radiographic exam LT foot 05/17/2024: Osteotomies across the metatarsals noted.  Radiolucency around the distal aspect of the first metatarsal osteotomy site.  This is likely the area of serous fluid buildup.  It does not seem to track proximal and is not consistent with gas gangrene clinically or radiographically  Component Ref Range & Units (hover) 13 d ago  MICRO NUMBER: [id number]  SPECIMEN QUALITY: Adequate  SOURCE: NOT GIVEN  STATUS: FINAL  GRAM STAIN: No white blood cells seen No epithelial cells seen No organisms seen  RESULT: Growth of skin flora (note: Growth does not include S. aureus, beta-hemolytic Streptococci or P. aeruginosa).  COMMENT: No source was provided. The specimen was tested and reported based upon the test code ordered. If this is incorrect, please contact client services.  Resulting Agency [facility] [place]    Assessment: 1. s/p TMA w/ TAL LT. Partial 3rd toe amputation RT. DOS: 12/04/2023 2.  Ulcer right third toe fat layer exposed secondary to diabetes mellitus w/ peripheral polyneuropathy 3.  History of PVD bilateral lower extremities 4.  Draining sinus/ulcer plantar aspect of the left foot amputation stump; stable 5.  Mild cellulitis left foot; mostly resolved   Plan of Care:  -Patient was evaluated.  MRI reviewed in detail today  -Cultures reviewed last visit negative for growth left foot draining  sinus -Debridement of the overlying callus to the plantar aspect of the left foot was performed today and heavy serous straw-colored drainage was expressed from the area.  Cultures were taken and sent to pathology for culture and sensitivity. - Plan for surgery which will include incision and drainage of the left foot amputation stump.  Bone biopsy first metatarsal left.  Debridement of ulcer with graft application right third toe -Return to clinic 1 week postop   Thresa EMERSON Sar, DPM [facility]  Dr. Thresa EMERSON Sar, DPM  [address] [place], [place] [address]                Office [phone]  Fax [phone]

## 2024-06-23 ENCOUNTER — Encounter: Payer: Self-pay | Admitting: Podiatry

## 2024-06-23 ENCOUNTER — Encounter: Admitting: Physical Therapy

## 2024-06-27 ENCOUNTER — Encounter: Admitting: Physical Therapy

## 2024-06-28 ENCOUNTER — Encounter: Payer: Self-pay | Admitting: Family Medicine

## 2024-06-28 ENCOUNTER — Ambulatory Visit (INDEPENDENT_AMBULATORY_CARE_PROVIDER_SITE_OTHER): Admitting: Family Medicine

## 2024-06-28 ENCOUNTER — Other Ambulatory Visit: Payer: Self-pay | Admitting: *Deleted

## 2024-06-28 ENCOUNTER — Ambulatory Visit: Payer: Self-pay | Admitting: Family Medicine

## 2024-06-28 VITALS — BP 118/76 | HR 64 | Temp 97.3°F | Ht 70.0 in | Wt 291.1 lb

## 2024-06-28 DIAGNOSIS — I25119 Atherosclerotic heart disease of native coronary artery with unspecified angina pectoris: Secondary | ICD-10-CM | POA: Diagnosis not present

## 2024-06-28 DIAGNOSIS — Z01818 Encounter for other preprocedural examination: Secondary | ICD-10-CM | POA: Diagnosis not present

## 2024-06-28 DIAGNOSIS — E1065 Type 1 diabetes mellitus with hyperglycemia: Secondary | ICD-10-CM | POA: Diagnosis not present

## 2024-06-28 DIAGNOSIS — Z6841 Body Mass Index (BMI) 40.0 and over, adult: Secondary | ICD-10-CM

## 2024-06-28 DIAGNOSIS — Z951 Presence of aortocoronary bypass graft: Secondary | ICD-10-CM

## 2024-06-28 LAB — MICROALBUMIN / CREATININE URINE RATIO
Creatinine,U: 34 mg/dL
Microalb Creat Ratio: UNDETERMINED mg/g (ref 0.0–30.0)
Microalb, Ur: <0.7 mg/dL

## 2024-06-28 NOTE — Progress Notes (Unsigned)
 Patient ID: Lawrence [name], male    DOB: Oct 24, 1951, 73 y.o.   MRN: [id number]  This visit was conducted in person.  BP 118/76   Pulse 64   Temp (!) 97.3 F (36.3 C) (Temporal)   Ht 5' 10 (1.778 m)   Wt 291 lb 2 oz (132.1 kg)   SpO2 98%   BMI 41.77 kg/m    CC:  Chief Complaint  Patient presents with  . Surgical Clearance    Subjective:   HPI: Lawrence [name] is a 73 y.o. male presenting on 06/28/2024 for Surgical Clearance   Patient of Mallie Gaskins, NP with history of  type 1 DM, CAD OSA, morbid obesity, HTN, stage 3a CKD, chronic osteomyelitis and atherosclerosis of extremities with ulceration.  He presents for preop evaluation with upcoming  left foot surgery and bone biopsy ( irrigation and debridement of abscess) for chronic osteomyelitis left foot with draining sinus.  Dr. Janit  Lab Results  Component Value Date   HGBA1C 7.9 06/09/2024  Recent labs reviewed in detail hepatic panel within normal range, complete metabolic panel showed GFR 57.  Home CBGs lows and highs. 109 now.  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 no  complications in past with anesthesia.  Plans this surgery in hospital.     Able to walk  up to  walk up flight of stairs without SOB, able to walk > 100 feet  without significant SOB.  No exertional chest pain.   Followed by Dr. Ammon [facility] at [facility]. 05/16/2024 lastr visit with Cardiology reviewed:        Relevant past medical, surgical, family and social history reviewed and updated as indicated. Interim medical history since our last visit reviewed. Allergies and medications reviewed and updated. Outpatient Medications Prior to Visit  Medication Sig Dispense Refill  . acetaminophen  (TYLENOL ) 500 MG tablet Take 1,000 mg by mouth every 6 (six) hours as needed for moderate pain (pain score 4-6).    SABRA AIMOVIG 140 MG/ML SOAJ Inject 140 mg into the skin every 30 (thirty) days.    . albuterol  (VENTOLIN  HFA) 108 (90 Base)  MCG/ACT inhaler Inhale 2 puffs into the lungs every 4 (four) hours as needed for wheezing. 1 each 0  . amoxicillin  (AMOXIL ) 500 MG tablet Take 2,000 mg by mouth See admin instructions. Take 4 capsules (2000 mg) by mouth 1 hour prior to dental appointments    . aspirin  81 MG EC tablet Take 81 mg by mouth daily. Swallow whole.    . Atogepant (QULIPTA) 60 MG TABS Take 60 mg by mouth 1 day or 1 dose.    . Calcium  Carb-Cholecalciferol  (CALCIUM  + D3 PO) Take 1 tablet by mouth in the morning and at bedtime.    . carvedilol (COREG) 12.5 MG tablet Take 12.5 mg by mouth 2 (two) times daily with a meal.    . Cholecalciferol  (VITAMIN D ) 50 MCG (2000 UT) tablet Take 2,000 Units by mouth in the morning and at bedtime.    . clopidogrel  (PLAVIX ) 75 MG tablet Take 1 tablet (75 mg total) by mouth daily. 30 tablet 0  . doxycycline  (VIBRA -TABS) 100 MG tablet Take 1 tablet (100 mg total) by mouth 2 (two) times daily. 20 tablet 0  . famotidine  (ACID CONTROLLER) 10 MG tablet Take 10 mg by mouth in the morning.    . fluticasone (FLONASE) 50 MCG/ACT nasal spray Place 1 spray into both nostrils daily as needed for allergies.    SABRA  furosemide  (LASIX ) 40 MG tablet Take 1 tablet (40 mg total) by mouth daily. 30 tablet 1  . glucagon 1 MG injection Inject 1 mg into the skin once as needed.    SABRA GLUTOSE 15 40 % GEL Take 1 Tube by mouth once as needed for low blood sugar.    . insulin  aspart (NOVOLOG ) 100 UNIT/ML injection Inject 20 Units into the skin 3 (three) times daily. (Patient taking differently: Inject 10-20 Units into the skin 3 (three) times daily with meals. Sliding Scale will go up to 25 units for BG > 300 - 400) 10 mL 5  . insulin  glargine-yfgn (SEMGLEE ) 100 UNIT/ML injection Inject 20 Units into the skin at bedtime.    . isosorbide  mononitrate (IMDUR ) 60 MG 24 hr tablet Take 60 mg by mouth in the morning and at bedtime.    . loratadine  (CLARITIN ) 10 MG tablet Take 10 mg by mouth in the morning.    . nitroGLYCERIN   (NITROSTAT ) 0.4 MG SL tablet DISSOLVE 1 TABLET UNDER THE TONGUE EVERY 5 MINUTES AS NEEDED FOR CHEST PAIN 30 tablet 0  . potassium chloride  SA (K-DUR,KLOR-CON ) 20 MEQ tablet Take 1 tablet (20 mEq total) by mouth daily. 30 tablet 1  . rosuvastatin  (CRESTOR ) 20 MG tablet Take 1 tablet (20 mg total) by mouth daily at 6 PM. 30 tablet 1  . Travoprost, BAK Free, (TRAVATAN) 0.004 % SOLN ophthalmic solution Place 1 drop into both eyes at bedtime.     . vitamin C  (ASCORBIC ACID ) 500 MG tablet Take 500 mg by mouth 2 (two) times daily.      No facility-administered medications prior to visit.     Per HPI unless specifically indicated in ROS section below Review of Systems Objective:  BP 118/76   Pulse 64   Temp (!) 97.3 F (36.3 C) (Temporal)   Ht 5' 10 (1.778 m)   Wt 291 lb 2 oz (132.1 kg)   SpO2 98%   BMI 41.77 kg/m   Wt Readings from Last 3 Encounters:  06/28/24 291 lb 2 oz (132.1 kg)  06/21/24 296 lb 8 oz (134.5 kg)  05/31/24 296 lb 8 oz (134.5 kg)      Physical Exam    Results for orders placed or performed in visit on 06/28/24  Microalbumin, urine   Collection Time: 06/09/24 12:00 AM  Result Value Ref Range   Microalb, Ur <0.3   Protein / creatinine ratio, urine   Collection Time: 06/09/24 12:00 AM  Result Value Ref Range   Creatinine, Urine 35.6   Basic metabolic panel with GFR   Collection Time: 06/09/24 12:00 AM  Result Value Ref Range   Glucose 150    BUN 20 4 - 21   CO2 32 (A) 13 - 22   Creatinine 1.3 0.6 - 1.3   Potassium 4.5 3.5 - 5.1 mEq/L   Sodium 141 137 - 147   Chloride 101 99 - 108  Comprehensive metabolic panel with GFR   Collection Time: 06/09/24 12:00 AM  Result Value Ref Range   eGFR 57.1    Calcium  9.7 8.7 - 10.7  Lipid panel   Collection Time: 06/09/24 12:00 AM  Result Value Ref Range   Triglycerides 56 40 - 160   Cholesterol 129 0 - 200   HDL 63 35 - 70   LDL Cholesterol 55   Hepatic function panel   Collection Time: 06/09/24 12:00 AM   Result Value Ref Range   ALT 10 10 -  40 U/L   AST 14 14 - 40  Hemoglobin A1c   Collection Time: 06/09/24 12:00 AM  Result Value Ref Range   Hemoglobin A1C 7.9     Assessment and Plan  Type 1 diabetes mellitus with hyperglycemia (HCC)    No follow-ups on file.   Greig Ring, MD

## 2024-06-29 ENCOUNTER — Encounter: Admitting: Physical Therapy

## 2024-06-29 NOTE — Assessment & Plan Note (Signed)
 Moderately high risk individual presents for clearance of need for urgent procedure for foot biopsy and irrigation /debridement. Patient is higher risk given his diabetes is inadequately controlled with a hemoglobin A1c of 7.9.  He also has a history of coronary artery disease status post coronary artery bypass graft.  As of today he states he is asymptomatic with no anginal chest pain or shortness of breath.  He is deconditioned and has morbid obesity that limit his endurance but he is able to do approximately 3 METS of activity without having to stop to rest. Recent office visit with his cardiologist reviewed from May 16, 2024.  No concerns at that point that would preclude upcoming procedure.  Encouraged patient to work on improving diabetic control with dietary changes and compliance with medication.  Improve diabetic control will improve healing and decrease infection.  It is unclear as to what type of anesthesia that he will have or whether he will be intubated.  He has tolerated many surgeries within the past few years with no complications from the surgery itself or anesthesia. He does have sleep apnea and has a narrow airway.  Forms completed and faxed to podiatry to proceed with procedure.

## 2024-06-30 ENCOUNTER — Other Ambulatory Visit: Payer: Self-pay

## 2024-06-30 ENCOUNTER — Encounter
Admission: RE | Admit: 2024-06-30 | Discharge: 2024-06-30 | Disposition: A | Discharge status: Home / Self Care | Source: Ambulatory Visit | Attending: Podiatry | Admitting: Podiatry

## 2024-06-30 DIAGNOSIS — E1065 Type 1 diabetes mellitus with hyperglycemia: Secondary | ICD-10-CM

## 2024-06-30 DIAGNOSIS — I10 Essential (primary) hypertension: Secondary | ICD-10-CM

## 2024-06-30 HISTORY — DX: Pneumonia, unspecified organism: J18.9

## 2024-06-30 NOTE — Patient Instructions (Addendum)
 Your procedure is scheduled on: 07/08/24 - Friday Report to the Registration Desk on the 1st floor of the [facility]. To find out your arrival time, please call [phone] between 1PM - 3PM on: 07/07/24 - Thursday If your arrival time is 6:00 am, do not arrive before that time as the [facility] entrance doors do not open until 6:00 am.  REMEMBER: Instructions that are not followed completely may result in serious medical risk, up to and including death; or upon the discretion of your surgeon and anesthesiologist your surgery may need to be rescheduled.  Do not eat food after midnight the night before surgery.  No gum chewing or hard candies.  You may however, drink CLEAR liquids up to 2 hours before you are scheduled to arrive for your surgery. Do not drink anything within 2 hours of your scheduled arrival time.  Clear liquids include: - water   One week prior to surgery: Stop Anti-inflammatories (NSAIDS) such as Advil, Aleve, Ibuprofen, Motrin, Naproxen, Naprosyn and Aspirin  based products such as Excedrin, Goody's Powder, BC Powder. You may continue to take Tylenol  if needed for pain up until the day of surgery.   Stop ANY OVER THE COUNTER supplements until after surgery : CALCIUM  + D3 ,VITAMIN D  ,VITAMIN C .  -  Plavix (clopidigrel) and Aspirin  -  hold 3 days prior to surgery beginning 07/05/24, resume taking with doctors instruction.  -  GLARGINE insulin , decrease your nighttime dose from 20 units to 10 units on Thursday 09/04.  ON THE DAY OF SURGERY ONLY TAKE THESE MEDICATIONS WITH SIPS OF WATER:  carvedilol (COREG) 12.5 MG tablet  famotidine  (PEPCID ) 20 MG tablet  isosorbide  mononitrate (IMDUR ) 60 MG 24 hr tablet   Use inhalers on the day of surgery and bring to the hospital.   No Alcohol for 24 hours before or after surgery.  No Smoking including e-cigarettes for 24 hours before surgery.  No chewable tobacco products for at least 6 hours before surgery.  No nicotine  patches on the day of surgery.  Do not use any recreational drugs for at least a week (preferably 2 weeks) before your surgery.  Please be advised that the combination of cocaine and anesthesia may have negative outcomes, up to and including death. If you test positive for cocaine, your surgery will be cancelled.  On the morning of surgery brush your teeth with toothpaste and water, you may rinse your mouth with mouthwash if you wish. Do not swallow any toothpaste or mouthwash.  Use CHG Soap or wipes as directed on instruction sheet.  Do not wear jewelry, make-up, hairpins, clips or nail polish.  For welded (permanent) jewelry: bracelets, anklets, waist bands, etc.  Please have this removed prior to surgery.  If it is not removed, there is a chance that hospital personnel will need to cut it off on the day of surgery.  Do not wear lotions, powders, or perfumes.   Do not shave body hair from the neck down 48 hours before surgery.  Contact lenses, hearing aids and dentures may not be worn into surgery.  Do not bring valuables to the hospital. [facility] is not responsible for any missing/lost belongings or valuables.   Bring your C-PAP to the hospital in case you may have to spend the night.   Notify your doctor if there is any change in your medical condition (cold, fever, infection).  Wear comfortable clothing (specific to your surgery type) to the hospital.  After surgery, you can help  prevent lung complications by doing breathing exercises.  Take deep breaths and cough every 1-2 hours. Your doctor may order a device called an Incentive Spirometer to help you take deep breaths.  When coughing or sneezing, hold a pillow firmly against your incision with both hands. This is called "splinting." Doing this helps protect your incision. It also decreases belly discomfort.  If you are being admitted to the hospital overnight, leave your suitcase in the car. After surgery it may be  brought to your room.  In case of increased patient census, it may be necessary for you, the patient, to continue your postoperative care in the Same Day Surgery department.  If you are being discharged the day of surgery, you will not be allowed to drive home. You will need a responsible individual to drive you home and stay with you for 24 hours after surgery.   If you are taking public transportation, you will need to have a responsible individual with you.  Please call the Pre-admissions Testing Dept. at [phone] if you have any questions about these instructions.  Surgery Visitation Policy:  Patients having surgery or a procedure may have two visitors.  Children under the age of 11 must have an adult with them who is not the patient.  Inpatient Visitation:    Visiting hours are 7 a.m. to 8 p.m. Up to four visitors are allowed at one time in a patient room. The visitors may rotate out with other people during the day.  One visitor age 30 or older may stay with the patient overnight and must be in the room by 8 p.m.   Merchandiser, retail to address health-related social needs:  [email or web address]

## 2024-07-01 ENCOUNTER — Encounter
Admission: RE | Admit: 2024-07-01 | Discharge: 2024-07-01 | Disposition: A | Discharge status: Home / Self Care | Source: Ambulatory Visit | Attending: Podiatry | Admitting: Podiatry

## 2024-07-01 DIAGNOSIS — I10 Essential (primary) hypertension: Secondary | ICD-10-CM | POA: Insufficient documentation

## 2024-07-01 DIAGNOSIS — E1065 Type 1 diabetes mellitus with hyperglycemia: Secondary | ICD-10-CM | POA: Diagnosis not present

## 2024-07-01 DIAGNOSIS — Z01818 Encounter for other preprocedural examination: Secondary | ICD-10-CM | POA: Insufficient documentation

## 2024-07-01 LAB — CBC
HCT: 36.7 % — ABNORMAL LOW (ref 39.0–52.0)
Hemoglobin: 12.4 g/dL — ABNORMAL LOW (ref 13.0–17.0)
MCH: 32.2 pg (ref 26.0–34.0)
MCHC: 33.8 g/dL (ref 30.0–36.0)
MCV: 95.3 fL (ref 80.0–100.0)
Platelets: 187 K/uL (ref 150–400)
RBC: 3.85 MIL/uL — ABNORMAL LOW (ref 4.22–5.81)
RDW: 12.3 % (ref 11.5–15.5)
WBC: 7.4 K/uL (ref 4.0–10.5)
nRBC: 0 % (ref 0.0–0.2)

## 2024-07-06 ENCOUNTER — Encounter: Payer: Self-pay | Admitting: Podiatry

## 2024-07-06 NOTE — Progress Notes (Signed)
 Perioperative / Anesthesia Services  Pre-Admission Testing Clinical Review / Pre-Operative Anesthesia Consult  Date: 07/07/24  PATIENT DEMOGRAPHICS: Name: Lawrence [name] DOB: February 22, 1951 MRN:   [id number]  Note: Available PAT nursing documentation and vital signs have been reviewed. Clinical nursing staff has updated patient's PMH/PSHx, current medication list, and drug allergies/intolerances to ensure complete and comprehensive history available to assist care teams in MDM as it pertains to the aforementioned surgical procedure and anticipated anesthetic course. Extensive review of available clinical information personally performed. [place] [facility] and PSHx updated with any diagnoses/procedures that  may have been inadvertently omitted during his intake with the pre-admission testing department's nursing staff.  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 male who is submitted for pre-surgical anesthesia review and clearance prior to him undergoing the above procedure. Patient is a Former Smoker (16.7 pack years; quit 07/1986). Pertinent PMH includes: CAD (s/p CABG), NSTEMI, diastolic dysfunction, PAD, chronic cerebral microvascular disease, angina, PAD, BILATERAL carotid artery disease, aortic atherosclerosis, HTN, HLD, T1DM, CKD-III, dyspnea, OSAH (requires nocturnal PAP therapy), GERD (on  daily H2 blocker), OA, osteomyelitis LEFT foot.   Patient is followed by cardiology Andrea, MD). He was last seen in the cardiology clinic on 04/19/2024; notes reviewed. At the time of his clinic visit, chronic chest pain, dyspnea, dizziness, and lower extremity edema.  All symptoms reported to be stable and at baseline.  Left lower extremity edema improved some following angiography and stent placement with vascular surgery.  Patient with recent onset of orthopnea over the course of the preceding 2 months.  He was experiencing rare episodes of palpitations. Patient denied any PND, palpitations, significant peripheral edema, weakness, fatigue, or presyncope/syncope. Patient with a past medical history significant for cardiovascular diagnoses. Documented physical exam was grossly benign, providing no evidence of acute exacerbation and/or decompensation of the patient's known cardiovascular conditions.  Patient suffered an NSTEMI on 07/27/2007.  Subsequent diagnostic LEFT heart catheterization was performed on 07/30/2007 revealing multivessel CAD; 30% mid LAD, 99% D1, 50% proximal LCx, 40% mid LCx, 75% distal LCx-1, 75% distal LCx-2, 85% distal LCx-3, 50% OM1, 75% OM2-1, 80% OM2-2, 80% OM2-3, 50% proximal RCA, 60% mid RCA, and 85% distal RCA.  Interventional cardiology noted that patient with significant RCA disease that was not amenable to PCI.  Intensified medical management was recommended.  Diagnostic LEFT heart catheterization was performed on 06/11/2016 revealing multivessel CAD; 95% distal LAD-1, 95% distal LAD-2, 40% mid LAD, 65% ostial LCx, 90% OM1, 90% OM2, 80% distal LCx, 70% LM, 70% proximal LAD, 65% proximal RCA-1, 95% proximal RCA-2, 45% mid RCA, and 60% distal RCA.  Given the degree and complexity of patient's coronary artery disease, patient was referred to CVTS for consideration of revascularization procedure.   Patient underwent four-vessel revascularization on 06/27/2016.  LIMA-LAD,  SVG-RCA, and sequential  SVG-OM1-OM2 bypass grafts were placed.   Patient with recurrent anginal symptoms.  He underwent repeat diagnostic LEFT heart catheterization on 01/13/2017 revealing multivessel CAD; 95% distal LAD-1, 95% distal LAD-2, 40% mid LAD, 65% ostial LCx, 90% OM1, 90% OM 2, 80% distal LCx, 70% LM, 70% proximal LAD, 65% proximal RCA-1, 95% proximal RCA-2, 100% mid RCA-1, 45% mid RCA-2, 100% distal RCA-1, and 60% distal RCA-2.  LIMA-LAD and SVG-OM1 bypass grafts were placed.  SVG-RCA was occluded and not felt to be amenable to PCI.  Intensified medical management was recommended.    Most recent myocardial perfusion imaging study was performed on 05/19/2023 revealing a normal left ventricular systolic function with a hyperdynamic LVEF of 69%.  There was a moderate mixed perfusion abnormality in the anterior lateral and apical regions with borderline evidence of redistribution.  Findings possibly consistent with ischemia with mixed redistribution.  Study determined to be moderate to high risk with equivocal findings for possible ischemia.  Repeat cardiac catheterization was recommended.   Most recent diagnostic LEFT heart catheterization was performed on 06/09/2023 revealing multivessel CAD; 75% mid LM, 80% ostial to proximal LAD, 60% mid LAD, 100% proximal mid LCx, 100% OM1, 95% proximal RCA, 100% mid RCA, and 50% mid to distal LAD.  Again, LIMA-LAD and SVG-OM1 bypass grafts were patent.  There was CTO of the SVG-PDA graft with adequate LEFT to RIGHT collateral formation.  Further intervention was deferred opting for aggressive medical management.  Most recent TTE performed on 05/12/2024 revealed a normal left ventricular systolic function with an EF of 55-60%. There were no regional wall motion abnormalities. Left ventricular diastolic Doppler parameters consistent with abnormal relaxation (G1DD).there was mild right atrial enlargement.  Right ventricular size and function normal with a TAPSE  measuring 2.7 cm  (normal range >/= 1.6 cm).  RVSP = 30 mmHg.  There was trivial to mild pan valvular regurgitation.  All transvalvular gradients were noted to be normal providing no evidence of hemodynamically significant valvular stenosis. Aorta normal in size with no evidence of ectasia or aneurysmal dilatation.  Given patient's significant cardiovascular disease, he remains on daily DAPT therapy using ASA + clopidogrel .  Patient is reportedly compliant with therapy with no evidence or reports of GI/GU related bleeding.  Blood pressure well controlled at 104/64 mmHg on currently prescribed beta-blocker (carvedilol), diuretic (furosemide ), and nitrate (isosorbide  mononitrate) therapies.  In addition to his scheduled nitrates, patient has a supply of short acting nitrates (NTG) to use on a as needed basis for recurrent angina/anginal equivalent symptoms; denied recent use.  Patient is on rosuvastatin  for his HLD diagnosis and ASCVD prevention. T1DM loosely controlled on currently prescribed regimen; last HgbA1c was 7.9% when checked on 06/09/2024. He does have an OSAH diagnosis and is reported to be compliant with prescribed nocturnal PAP therapy.  Functional capacity limited by age, arthritides, and multiple medical comorbidities.  With that said, patient experiences difficulties, due to cardiovascular limitation, with performing his ADLs/IADLs.  Per the DAS[facility], patient is note felt to be able to achieve 4 METS of physical activity without experiencing, at least to some degree, significant angina/anginal equivalent symptoms.  No changes were made to his medication regimen during his visit with cardiology.  Patient scheduled to follow-up with outpatient cardiology in 4 months or sooner if needed.   Lawrence [name] is scheduled for an elective IRRIGATION AND DEBRIDEMENT ABSCESS (Right); IRRIGATION AND DEBRIDEMENT FOOT (Right) on 07/08/2024 with Dr. Thresa CHRISTELLA Sar, DPM. Given patient's past medical history  significant for  cardiovascular diagnoses, presurgical cardiac clearance was sought by the PAT team. Per cardiology, this patient is optimized for surgery and may proceed with the planned procedural course with a LOW risk of significant perioperative cardiovascular complications.  Again, this patient is on daily DAPT therapy.  Patient has been instructed on recommendations for holding his low dose ASA and clopidogrel  for 3 days prior to his procedure with plans to restart as soon as postoperative bleeding respectively minimized by his primary attending surgeon.  He is aware that his last dose of DAPT medications should be on 07/05/2024.   Patient denies previous perioperative complications with anesthesia in the past. In review his EMR, it is noted that patient underwent a general anesthetic course here at [facility] (ASA III) in 11/2023 without documented complications.   MOST RECENT VITAL SIGNS:    06/28/2024   11:25 AM 06/21/2024   11:51 AM 05/31/2024    9:55 AM  Vitals with BMI  Height 5' 10 5' 10 5' 10  Weight 291 lbs 2 oz 296 lbs 8 oz 296 lbs 8 oz  BMI 41.77 42.54 42.54  Systolic 118    Diastolic 76    Pulse 64     PROVIDERS/SPECIALISTS: NOTE: Primary physician provider listed below. Patient may have been seen by APP or partner within same practice.   PROVIDER ROLE / SPECIALTY [facility] Janit Thresa CHRISTELLA, DPM Podiatry (Surgeon) 06/21/2024  Gretta Comer POUR, NP Primary Care Provider 06/28/2024  Ammon Blunt, MD Cardiology 04/19/2024  Jama Hacker, MD Vascular Surgery 05/24/2024  Maree Hila, MD Neurology 04/21/2024   ALLERGIES: Allergies  Allergen Reactions   Latex Other (See Comments)    Blister and burning (72M insulin  pump latex band aid )   Nitroglycerin  Nausea Only and Other (See Comments)    Patches only - headache   Statins Other (See Comments)    BODY PAIN. Taking rosuvastatin  in the evenings and tolerating.     Fluvastatin Other (See Comments)    Muscle pain   Fluvastatin Sodium Other (See Comments)    Myalgias   Percocet [Oxycodone -Acetaminophen ] Itching   Rosuvastatin  Other (See Comments)    Myalgias. Taking in evening and tolerating   Simvastatin Other (See Comments)    Muscle pain, Myalgias   Lipitor [Atorvastatin] Other (See Comments) and Rash    BODY PAIN   Lisinopril  Nausea Only and Other (See Comments)    Other reaction(s): Dizziness   Zetia [Ezetimibe] Other (See Comments)    Muscle Pain    CURRENT HOME MEDICATIONS: No current facility-administered medications for this encounter.    acetaminophen  (TYLENOL ) 500 MG tablet   AIMOVIG 140 MG/ML SOAJ   albuterol  (VENTOLIN  HFA) 108 (90 Base) MCG/ACT inhaler   amoxicillin  (AMOXIL ) 500 MG tablet   aspirin  81 MG EC tablet   Atogepant (QULIPTA) 60 MG TABS   azelastine (ASTELIN) 0.1 % nasal spray   Calcium  Carb-Cholecalciferol  (CALCIUM  + D3 PO)   carvedilol (COREG) 12.5 MG tablet   Cholecalciferol  (VITAMIN D ) 50 MCG (2000 UT) tablet   clopidogrel  (PLAVIX ) 75 MG tablet   famotidine  (ACID CONTROLLER) 10 MG tablet   furosemide  (LASIX ) 40 MG tablet   glucagon 1 MG injection   GLUTOSE 15 40 % GEL   insulin  aspart (NOVOLOG ) 100 UNIT/ML injection   insulin  glargine-yfgn (SEMGLEE ) 100 UNIT/ML injection   isosorbide  mononitrate (IMDUR ) 60 MG 24 hr tablet   loratadine  (CLARITIN ) 10 MG tablet   nitroGLYCERIN  (NITROSTAT ) 0.4 MG SL tablet  potassium chloride  SA (K-DUR,KLOR-CON ) 20 MEQ tablet   rosuvastatin  (CRESTOR ) 20 MG tablet   Travoprost, BAK Free, (TRAVATAN) 0.004 % SOLN ophthalmic solution   vitamin C  (ASCORBIC ACID ) 500 MG tablet   HISTORY: Past Medical History:  Diagnosis Date   Allergies    Anginal pain (HCC)    Aortic atherosclerosis (HCC)    Bilateral carotid artery disease (HCC)    Breast pain, left 08/29/2022   CAD (coronary artery disease) 07/2007   a.) s/p NSTEMI 07/2007 - sig multi-vessel CAD not amenable to PCI; b.)  LHC 06/11/2016: multi vessel CAD not amenable to PCI --> refer to CVTS; c.) s/p 4v CABG 06/27/2016; d.) LHC 06/09/2023: 75% mLM, 80% o-pLAD, 60% mLAD, 100% p-mLCx, 100% OM1, 95% pRCA, 100% mRCA, 50% m-dLAD. LIMA-LAD and SVG-OM1 patent. CTO SVG-RCA with L-R collaterals - med mgmt   Carpal tunnel syndrome on left    a.) s/p release 04/2016   Cerebral microvascular disease    CKD (chronic kidney disease), stage III (HCC)    COVID-19 12/09/2022   Diabetic peripheral neuropathy (HCC)    Diastolic dysfunction    a.) TTE 06/25/2016: EF 60-654%. no RWMAs, G1DD, mild AoV annular calc; b.) TTE 03/19/2017: EF 50-55%, no RWMAs, mild MR; c.) TTE 07/22/2019: EF 55-60%, midl LVH, no RWMAs, triv MR, mild TR   Dizziness 04/30/2021   Dyspnea    GERD (gastroesophageal reflux disease)    Glaucoma    Headache    History of bilateral cataract extraction    HLD (hyperlipidemia)    HTN (hypertension)    Long term current use of clopidogrel     Long-term use of aspirin  therapy    MRSA (methicillin resistant staph aureus) culture positive    2006 - 2008   NSTEMI (non-ST elevated myocardial infarction) (HCC) 07/27/2007   a.) LHC 07/30/2007: 30% mLAD, 99% D1, 50% pLCx, 40% mLCx, 70/75/85% dLCx, 50% OM1, 75/80/80 OM2, 50% pRCA, 60% mRCA, 85% dRCA --> not amenable to PCI   OSA on CPAP    Osteoarthritis    Osteomyelitis of left foot (HCC)    PAD (peripheral artery disease) (HCC)    a. 03/2022 Lower Ext Angio: No signif Ao-iliac dzs. R PT diff dzs and occluded above ankle. R Pedal arch intact w/ excellent antegrade flow provided by R AT-->no indication for revasc.   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   a.) LIMA-LAD, SVG-RCA, seq SVG-OM1-OM2   S/P tube myringotomy    Skin cancer, basal cell    Skin ulcer of right great toe, unspecified ulcer stage (HCC)    T1DM (type 1 diabetes mellitus) (HCC)    Wears glasses    Wears partial dentures    top   Past Surgical  History:  Procedure Laterality Date   ABDOMINAL AORTOGRAM W/LOWER EXTREMITY N/A 03/26/2022   Procedure: ABDOMINAL AORTOGRAM W/LOWER EXTREMITY;  Surgeon: Darron Deatrice LABOR, MD;  [facility];  Service: Cardiovascular;  Laterality: N/A;   AMPUTATION TOE Bilateral 06/26/2023   Procedure: AMPUTATION TOE MPJ OF THE JOINT,PARTIAL TOE AMPUTUATION;  Surgeon: Janit Thresa HERO, DPM;  Location: [facility] ORS;  Service: Orthopedics/Podiatry;  Laterality: Bilateral;   AMPUTATION TOE Right 12/04/2023   Procedure: AMPUTATION TOE INTERPHALANGEAL 3RD;  Surgeon: Janit Thresa HERO, DPM;  Location: [facility] ORS;  Service: Orthopedics/Podiatry;  Laterality: Right;   APPENDECTOMY     CARDIAC CATHETERIZATION N/A 06/11/2016   Procedure: Left Heart Cath and Coronary Angiography;  Surgeon: Wolm JINNY Rhyme, MD;  Location: [facility];  Service: Cardiovascular;  Laterality: N/A;   CARPAL TUNNEL RELEASE Left 04/24/2016   Procedure: CARPAL TUNNEL RELEASE;  Surgeon: Ozell Flake, MD;  Location: [facility] ORS;  Service: Orthopedics;  Laterality: Left;   CATARACT EXTRACTION W/ INTRAOCULAR LENS IMPLANT Right 07/16/2012   CATARACT EXTRACTION W/ INTRAOCULAR LENS IMPLANT Left 08/13/2012   CORONARY ARTERY BYPASS GRAFT N/A 06/27/2016   Procedure: CORONARY ARTERY BYPASS GRAFTING times four using left internal mammary artery and right leg saphenous vein;  Surgeon: Maude Fleeta Ochoa, MD;  Location: [facility] OR;  Service: Open Heart Surgery;  Laterality: N/A;   FOREARM FRACTURE SURGERY Left 1961   KNEE ARTHROPLASTY Left 02/11/2016   Procedure: COMPUTER ASSISTED TOTAL KNEE ARTHROPLASTY;  Surgeon: Lynwood SHAUNNA Hue, MD;  Location: [facility] ORS;  Service: Orthopedics;  Laterality: Left;   KNEE ARTHROSCOPY Right 2000   LEFT HEART CATH AND CORONARY ANGIOGRAPHY Left 07/30/2007   Procedure: LEFT HEART CATH AND CORONARY ANGIOGRAPHY; Location: [facility]; Surgeon: Wolm Rhyme, MD   LEFT HEART CATH AND CORS/GRAFTS ANGIOGRAPHY Left 01/13/2017   Procedure:  Left Heart Cath and Cors/Grafts Angiography;  Surgeon: Wolm JINNY Rhyme, MD;  Location: [facility];  Service: Cardiovascular;  Laterality: Left;   LEFT HEART CATH AND CORS/GRAFTS ANGIOGRAPHY Left 06/09/2023   Procedure: LEFT HEART CATH AND CORS/GRAFTS ANGIOGRAPHY;  Surgeon: Ammon Blunt, MD;  Location: [facility];  Service: Cardiovascular;  Laterality: Left;   LOWER EXTREMITY ANGIOGRAPHY Left 02/23/2024   Procedure: Lower Extremity Angiography;  Surgeon: Jama Cordella MATSU, MD;  Location: [facility];  Service: Cardiovascular;  Laterality: Left;   LOWER EXTREMITY ANGIOGRAPHY Right 05/03/2024   Procedure: Lower Extremity Angiography;  Surgeon: Jama Cordella MATSU, MD;  Location: [facility];  Service: Cardiovascular;  Laterality: Right;   LOWER EXTREMITY INTERVENTION Left 02/23/2024   Procedure: LOWER EXTREMITY INTERVENTION;  Surgeon: Jama Cordella MATSU, MD;  Location: [facility];  Service: Cardiovascular;  Laterality: Left;   METATARSAL HEAD EXCISION Right 03/21/2020   Procedure: METATARSAL HEAD RESECTION RIGHT AND DEBRIDEMENT OF ULCER;  Surgeon: Janit Thresa HERO, DPM;  Location: [facility] OR;  Service: Podiatry;  Laterality: Right;   METATARSAL HEAD EXCISION Left 09/15/2023   Procedure: METATARSAL HEAD EXCISION;  Surgeon: Janit Thresa HERO, DPM;  Location: [facility];  Service: Orthopedics/Podiatry;  Laterality: Left;   MULTIPLE TOOTH EXTRACTIONS     TEE WITHOUT CARDIOVERSION N/A 06/27/2016   Procedure: TRANSESOPHAGEAL ECHOCARDIOGRAM (TEE);  Surgeon: Maude Fleeta Ochoa, MD;  Location: [facility] OR;  Service: Open Heart Surgery;  Laterality: N/A;   TENDON LENGTHENING Left 12/04/2023   Procedure: TENDON-ACHILLES LENGTHENING;  Surgeon: Janit Thresa HERO, DPM;  Location: [facility] ORS;  Service: Orthopedics/Podiatry;  Laterality: Left;   TRANSMETATARSAL AMPUTATION Left 12/04/2023   Procedure: TRANSMETATARSAL AMPUTATION;  Surgeon: Janit Thresa HERO, DPM;  Location: [facility] ORS;   Service: Orthopedics/Podiatry;  Laterality: Left;   Family History  Problem Relation Age of Onset   Hypertension Mother    Lupus Mother    Heart Problems Mother    Hypertension Father    Diabetes Father    Heart attack Father 12       died in his 81s   Drug abuse Brother    Heart disease Brother    Heart attack Maternal Grandmother    Arthritis Maternal Grandmother    Arthritis Maternal Grandfather    Heart attack Paternal Grandmother    Arthritis Paternal Grandmother    Arthritis  Paternal Grandfather    Social History   Tobacco Use   Smoking status: Former    Current packs/day: 0.00    Average packs/day: 1 pack/day for 16.7 years (16.7 ttl pk-yrs)    Types: Cigarettes    Start date: 11/03/1969    Quit date: 07/22/1986    Years since quitting: 37.9   Smokeless tobacco: Never  Substance Use Topics   Alcohol use: Not Currently   LABS:  Hospital Outpatient Visit on 07/01/2024  Component Date Value Ref Range Status   WBC 07/01/2024 7.4  4.0 - 10.5 K/uL Final   RBC 07/01/2024 3.85 (L)  4.22 - 5.81 MIL/uL Final   Hemoglobin 07/01/2024 12.4 (L)  13.0 - 17.0 g/dL Final   HCT 91/70/7974 36.7 (L)  39.0 - 52.0 % Final   MCV 07/01/2024 95.3  80.0 - 100.0 fL Final   MCH 07/01/2024 32.2  26.0 - 34.0 pg Final   MCHC 07/01/2024 33.8  30.0 - 36.0 g/dL Final   RDW 91/70/7974 12.3  11.5 - 15.5 % Final   Platelets 07/01/2024 187  150 - 400 K/uL Final   nRBC 07/01/2024 0.0  0.0 - 0.2 % Final   Performed at [facility], [address] [place], [place] [address]  Office Visit on 06/28/2024  Component Date Value Ref Range Status   Microalb, Ur 06/09/2024 <0.3   Final   Creatinine, Urine 06/09/2024 35.6   Final   Glucose 06/09/2024 150   Final   BUN 06/09/2024 20  4 - 21 Final   CO2 06/09/2024 32 (A)  13 - 22 Final   Creatinine 06/09/2024 1.3  0.6 - 1.3 Final   Potassium 06/09/2024 4.5  3.5 - 5.1 mEq/L Final   Sodium 06/09/2024 141  137 - 147 Final   Chloride 06/09/2024 101   99 - 108 Final   eGFR 06/09/2024 57.1   Final   Calcium  06/09/2024 9.7  8.7 - 10.7 Final   Triglycerides 06/09/2024 56  40 - 160 Final   Cholesterol 06/09/2024 129  0 - 200 Final   HDL 06/09/2024 63  35 - 70 Final   LDL Cholesterol 06/09/2024 55   Final   ALT 06/09/2024 10  10 - 40 U/L Final   AST 06/09/2024 14  14 - 40 Final   Hemoglobin A1C 06/09/2024 7.9   Final   Microalb, Ur 06/28/2024 <0.7  mg/dL Final   Creatinine,U 91/73/7974 34.0  mg/dL Final   Microalb Creat Ratio 06/28/2024 Unable to calculate  0.0 - 30.0 mg/g Final   Unable to Calculate due to Microalbumin Result of <0.7 mg/dL    ECG: Date: 91/70/7974  Time ECG obtained: 1103 AM Rate: 56 bpm Rhythm: sinus bradycardia Axis (leads I and aVF): normal Intervals: PR 184 ms. QRS 90 ms. QTc 413 ms. ST segment and T wave changes: No evidence of acute T wave abnormalities or significant ST segment elevation or depression.  Evidence of a possible, age undetermined, prior infarct:  Yes; anterior Comparison: Similar to previous tracing obtained on 06/09/2023   IMAGING / PROCEDURES: MR FOOT LEFT WO CONTRAST performed on 06/06/2024 Marrow edema in the distal 3 cm of the first metatarsal remnant is worrisome for osteomyelitis. Minimal marrow edema in the distal 1.5-2 cm of the second and third metatarsal remnants is nonspecific and could be reactive or due to early osteomyelitis. Fluid collection anterior and subjacent to the first metatarsal remnant is worrisome for abscess. Diffuse subcutaneous edema about the imaged foot could  be due to cellulitis or dependent change.  VAS US  ABI WITH/WO TBI performed on 05/24/2024 Resting right ankle-brachial index indicates noncompressible right lower extremity arteries.  Resting left ankle-brachial index is within normal range.    TRANSTHORACIC ECHOCARDIOGRAM performed on 05/12/2024 Normal left ventricular systolic function with an EF of >55% No regional wall motion abnormalities Left  ventricular diastolic Doppler parameters consistent with abnormal relaxation (G1DD). Right atrium mildly enlarged Normal right ventricular size and function Trivial to mild pan valvular regurgitation RVSP = 30 mmHg Normal gradients; no valvular stenosis Normal aorta   MR BRAIN W WO CONTRAST performed on 01/27/2024 No evidence of an acute intracranial abnormality. Moderate chronic small vessel ischemic changes within the cerebral white matter, slightly progressed from the brain MRI of 06/19/2021. Mild chronic small vessel ischemic changes within the pons, unchanged. Mild generalized cerebral atrophy.  LEFT HEART CATHETERIZATION AND CORONARY ANGIOGRAPHY performed on 06/09/2023 Normal left ventricular systolic function with an EF of 55-65%.   LVEDP normal.   Multivessel CAD 75% mid LM 80% ostial to proximal LAD 60% mid LAD 100% proximal and mid LCx 100% OM1 95% proximal RCA 100% mid RCA 50% mid to distal LAD Bypass grafts LIMA-LAD patent SVG-OM1 patent SVG-RCA (PDA) chronically occluded with left-to-right collaterals Recommendations Continue medical therapy and aggressive risk factor modification    MYOCARDIAL PERFUSION IMAGING STUDY (LEXISCAN ) performed on 05/19/2023 Abnormal myocardial perfusion scan area of anterior lateral apical defect with borderline evidence of redistribution. Possibly consistent with ischemia with mixed redistribution  Ejection fraction of 69%  Moderate to high risk and with equivocal findings for possible ischemia    VAS US  ABI WITH/WO TBI performed on 10/07/2022 Resting right ankle-brachial index indicates noncompressible right lower extremity arteries. Right great toe amputation.  Resting left ankle-brachial index indicates noncompressible left lower extremity arteries. The left toe-brachial index is abnormal.    IMPRESSION AND PLAN: NORWOOD QUEZADA has been referred for pre-anesthesia review and clearance prior to him undergoing the planned  anesthetic and procedural courses. Available labs, pertinent testing, and imaging results were personally reviewed by me in preparation for upcoming operative/procedural course. [facility] medical record has been updated following extensive record review and patient interview with PAT staff.   This patient has been appropriately cleared by cardiology with an overall LOW risk of patient experiencing significant perioperative cardiovascular complications. Based on clinical review performed today (07/07/24), barring any significant acute changes in the patient's overall condition, it is anticipated that he will be able to proceed with the planned surgical intervention. Any acute changes in clinical condition may necessitate his procedure being postponed and/or cancelled. Patient will meet with anesthesia team (MD and/or CRNA) on the day of his procedure for preoperative evaluation/assessment. Questions regarding anesthetic course will be fielded at that time.   Pre-surgical instructions were reviewed with the patient during his PAT appointment, and questions were fielded to satisfaction by PAT clinical staff. He has been instructed on which medications that he will need to hold prior to surgery, as well as the ones that have been deemed safe/appropriate to take on the day of his procedure. As part of the general education provided by PAT, patient made aware both verbally and in writing, that he would need to abstain from the use of any illegal substances during his perioperative course. He was advised that failure to follow the provided instructions could necessitate case cancellation or result in serious perioperative complications up to and including death. Patient encouraged to contact PAT and/or his surgeon's  office to discuss any questions or concerns that may arise prior to surgery; verbalized understanding.   Dorise Pereyra, MSN, APRN, FNP-C, CEN [facility]  Perioperative Services Nurse  Practitioner Phone: [phone] Fax: [phone] 07/07/24 2:46 PM  NOTE: This note has been prepared using Dragon dictation software. Despite my best ability to proofread, there is always the potential that unintentional transcriptional errors may still occur from this process.

## 2024-07-07 ENCOUNTER — Other Ambulatory Visit (INDEPENDENT_AMBULATORY_CARE_PROVIDER_SITE_OTHER): Payer: Self-pay | Admitting: Nurse Practitioner

## 2024-07-07 DIAGNOSIS — Z9889 Other specified postprocedural states: Secondary | ICD-10-CM

## 2024-07-07 DIAGNOSIS — I7025 Atherosclerosis of native arteries of other extremities with ulceration: Secondary | ICD-10-CM

## 2024-07-08 ENCOUNTER — Encounter
Admission: RE | Disposition: A | Discharge status: Home / Self Care | Payer: Self-pay | Source: Ambulatory Visit | Attending: Podiatry

## 2024-07-08 ENCOUNTER — Ambulatory Visit: Payer: Self-pay | Admitting: Urgent Care

## 2024-07-08 ENCOUNTER — Other Ambulatory Visit: Payer: Self-pay

## 2024-07-08 ENCOUNTER — Ambulatory Visit
Admission: RE | Admit: 2024-07-08 | Discharge: 2024-07-08 | Disposition: A | Discharge status: Home / Self Care | Source: Ambulatory Visit | Attending: Podiatry | Admitting: Podiatry

## 2024-07-08 DIAGNOSIS — I252 Old myocardial infarction: Secondary | ICD-10-CM | POA: Insufficient documentation

## 2024-07-08 DIAGNOSIS — E785 Hyperlipidemia, unspecified: Secondary | ICD-10-CM | POA: Insufficient documentation

## 2024-07-08 DIAGNOSIS — E10621 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: Secondary | ICD-10-CM | POA: Diagnosis present

## 2024-07-08 DIAGNOSIS — E1022 Type 1 diabetes mellitus with diabetic chronic kidney disease: Secondary | ICD-10-CM | POA: Insufficient documentation

## 2024-07-08 DIAGNOSIS — K219 Gastro-esophageal reflux disease without esophagitis: Secondary | ICD-10-CM | POA: Insufficient documentation

## 2024-07-08 DIAGNOSIS — M86472 Chronic osteomyelitis with draining sinus, left ankle and foot: Secondary | ICD-10-CM | POA: Insufficient documentation

## 2024-07-08 DIAGNOSIS — Z951 Presence of aortocoronary bypass graft: Secondary | ICD-10-CM | POA: Diagnosis not present

## 2024-07-08 DIAGNOSIS — L97522 Non-pressure chronic ulcer of other part of left foot with fat layer exposed: Secondary | ICD-10-CM | POA: Insufficient documentation

## 2024-07-08 DIAGNOSIS — L97512 Non-pressure chronic ulcer of other part of right foot with fat layer exposed: Secondary | ICD-10-CM | POA: Insufficient documentation

## 2024-07-08 DIAGNOSIS — E1065 Type 1 diabetes mellitus with hyperglycemia: Secondary | ICD-10-CM

## 2024-07-08 DIAGNOSIS — E11621 Type 2 diabetes mellitus with foot ulcer: Secondary | ICD-10-CM | POA: Diagnosis not present

## 2024-07-08 DIAGNOSIS — Z87891 Personal history of nicotine dependence: Secondary | ICD-10-CM | POA: Diagnosis not present

## 2024-07-08 DIAGNOSIS — L97519 Non-pressure chronic ulcer of other part of right foot with unspecified severity: Secondary | ICD-10-CM | POA: Diagnosis present

## 2024-07-08 DIAGNOSIS — I700 Atherosclerosis of aorta: Secondary | ICD-10-CM | POA: Diagnosis not present

## 2024-07-08 DIAGNOSIS — M86471 Chronic osteomyelitis with draining sinus, right ankle and foot: Secondary | ICD-10-CM | POA: Diagnosis not present

## 2024-07-08 DIAGNOSIS — G4733 Obstructive sleep apnea (adult) (pediatric): Secondary | ICD-10-CM | POA: Diagnosis not present

## 2024-07-08 DIAGNOSIS — I2510 Atherosclerotic heart disease of native coronary artery without angina pectoris: Secondary | ICD-10-CM | POA: Diagnosis not present

## 2024-07-08 DIAGNOSIS — I1310 Hypertensive heart and chronic kidney disease without heart failure, with stage 1 through stage 4 chronic kidney disease, or unspecified chronic kidney disease: Secondary | ICD-10-CM | POA: Diagnosis not present

## 2024-07-08 HISTORY — DX: Other cerebrovascular disease: I67.89

## 2024-07-08 HISTORY — DX: Osteomyelitis, unspecified: M86.9

## 2024-07-08 HISTORY — DX: Type 1 diabetes mellitus without complications: E10.9

## 2024-07-08 HISTORY — DX: Atherosclerosis of aorta: I70.0

## 2024-07-08 HISTORY — PX: IRRIGATION AND DEBRIDEMENT FOOT: SHX6602

## 2024-07-08 HISTORY — PX: IRRIGATION AND DEBRIDEMENT ABSCESS: SHX5252

## 2024-07-08 LAB — GLUCOSE, CAPILLARY
Glucose-Capillary: 224 mg/dL — ABNORMAL HIGH (ref 70–99)
Glucose-Capillary: 200 mg/dL — ABNORMAL HIGH (ref 70–99)

## 2024-07-08 SURGERY — IRRIGATION AND DEBRIDEMENT ABSCESS
Anesthesia: General | Laterality: Right

## 2024-07-08 MED ORDER — BUPIVACAINE HCL (PF) 0.5 % IJ SOLN
INTRAMUSCULAR | Status: AC
  Filled 2024-07-08: qty 30

## 2024-07-08 MED ORDER — 0.9 % SODIUM CHLORIDE (POUR BTL) OPTIME
TOPICAL | Status: DC | PRN
  Administered 2024-07-08: 1000 mL

## 2024-07-08 MED ORDER — LIDOCAINE HCL (CARDIAC) PF 100 MG/5ML IV SOSY
PREFILLED_SYRINGE | INTRAVENOUS | Status: DC | PRN
  Administered 2024-07-08: 100 mg via INTRAVENOUS

## 2024-07-08 MED ORDER — LIDOCAINE HCL (PF) 1 % IJ SOLN
INTRAMUSCULAR | Status: AC
  Filled 2024-07-08: qty 30

## 2024-07-08 MED ORDER — SODIUM CHLORIDE 0.9 % IV SOLN: INTRAVENOUS | Status: DC

## 2024-07-08 MED ORDER — LIDOCAINE HCL (PF) 2 % IJ SOLN
INTRAMUSCULAR | Status: AC
  Filled 2024-07-08: qty 5

## 2024-07-08 MED ORDER — INSULIN ASPART 100 UNIT/ML IJ SOLN
INTRAMUSCULAR | Status: AC
  Filled 2024-07-08: qty 1

## 2024-07-08 MED ORDER — GLYCOPYRROLATE 0.2 MG/ML IJ SOLN
INTRAMUSCULAR | Status: DC | PRN
  Administered 2024-07-08: .2 mg via INTRAVENOUS

## 2024-07-08 MED ORDER — FENTANYL CITRATE (PF) 100 MCG/2ML IJ SOLN
INTRAMUSCULAR | Status: DC | PRN
  Administered 2024-07-08 (×2): 50 ug via INTRAVENOUS

## 2024-07-08 MED ORDER — INSULIN ASPART 100 UNIT/ML IJ SOLN
5.0000 [IU] | Freq: Once | INTRAMUSCULAR | Status: AC
  Administered 2024-07-08: 5 [IU] via SUBCUTANEOUS

## 2024-07-08 MED ORDER — PROPOFOL 1000 MG/100ML IV EMUL
INTRAVENOUS | Status: AC
  Filled 2024-07-08: qty 100

## 2024-07-08 MED ORDER — EPHEDRINE SULFATE-NACL 50-0.9 MG/10ML-% IV SOSY
PREFILLED_SYRINGE | INTRAVENOUS | Status: DC | PRN
  Administered 2024-07-08 (×2): 5 mg via INTRAVENOUS

## 2024-07-08 MED ORDER — FENTANYL CITRATE (PF) 100 MCG/2ML IJ SOLN
INTRAMUSCULAR | Status: AC
  Filled 2024-07-08: qty 2

## 2024-07-08 MED ORDER — PHENYLEPHRINE 80 MCG/ML (10ML) SYRINGE FOR IV PUSH (FOR BLOOD PRESSURE SUPPORT)
PREFILLED_SYRINGE | INTRAVENOUS | Status: DC | PRN
  Administered 2024-07-08 (×5): 80 ug via INTRAVENOUS

## 2024-07-08 MED ORDER — INSULIN ASPART 100 UNIT/ML IJ SOLN
8.0000 [IU] | Freq: Once | INTRAMUSCULAR | Status: AC
  Administered 2024-07-08: 8 [IU] via SUBCUTANEOUS

## 2024-07-08 MED ORDER — PHENYLEPHRINE 80 MCG/ML (10ML) SYRINGE FOR IV PUSH (FOR BLOOD PRESSURE SUPPORT)
PREFILLED_SYRINGE | INTRAVENOUS | Status: AC
Start: 2024-07-08 — End: 2024-07-08
  Filled 2024-07-08: qty 10

## 2024-07-08 MED ORDER — ONDANSETRON HCL 4 MG/2ML IJ SOLN
INTRAMUSCULAR | Status: AC
  Filled 2024-07-08: qty 2

## 2024-07-08 MED ORDER — ONDANSETRON HCL 4 MG/2ML IJ SOLN
INTRAMUSCULAR | Status: DC | PRN
  Administered 2024-07-08: 4 mg via INTRAVENOUS

## 2024-07-08 MED ORDER — CHLORHEXIDINE GLUCONATE 0.12 % MT SOLN
15.0000 mL | Freq: Once | OROMUCOSAL | Status: AC
  Administered 2024-07-08: 15 mL via OROMUCOSAL

## 2024-07-08 MED ORDER — ORAL CARE MOUTH RINSE: 15.0000 mL | Freq: Once | OROMUCOSAL | Status: AC

## 2024-07-08 MED ORDER — CHLORHEXIDINE GLUCONATE 0.12 % MT SOLN
OROMUCOSAL | Status: AC
  Filled 2024-07-08: qty 15

## 2024-07-08 MED ORDER — PROPOFOL 500 MG/50ML IV EMUL
INTRAVENOUS | Status: DC | PRN
  Administered 2024-07-08: 80 ug/kg/min via INTRAVENOUS

## 2024-07-08 MED ORDER — EPHEDRINE 5 MG/ML INJ
INTRAVENOUS | Status: AC
  Filled 2024-07-08: qty 5

## 2024-07-08 SURGICAL SUPPLY — 53 items
BENZOIN TINCTURE PRP APPL 2/3 (GAUZE/BANDAGES/DRESSINGS) IMPLANT
BNDG COHESIVE 4X5 TAN STRL LF (GAUZE/BANDAGES/DRESSINGS) ×2 IMPLANT
BNDG COHESIVE 6X5 TAN ST LF (GAUZE/BANDAGES/DRESSINGS) ×2 IMPLANT
BNDG ELASTIC 4INX 5YD STR LF (GAUZE/BANDAGES/DRESSINGS) ×2 IMPLANT
BNDG ESMARCH 4X12 STRL LF (GAUZE/BANDAGES/DRESSINGS) ×2 IMPLANT
BNDG GAUZE DERMACEA FLUFF 4 (GAUZE/BANDAGES/DRESSINGS) ×2 IMPLANT
BNDG STRETCH GAUZE 3IN X12FT (GAUZE/BANDAGES/DRESSINGS) ×2 IMPLANT
CUFF TOURN SGL QUICK 18X4 (TOURNIQUET CUFF) IMPLANT
CUFF TRNQT CYL 24X4X16.5-23 (TOURNIQUET CUFF) IMPLANT
DRAPE BILAT LIMB 76X120 89291 (MISCELLANEOUS) IMPLANT
DRSG EMULSION OIL 3X3 NADH (GAUZE/BANDAGES/DRESSINGS) IMPLANT
DRSG EMULSION OIL 3X8 NADH (GAUZE/BANDAGES/DRESSINGS) ×2 IMPLANT
DURAPREP 26ML APPLICATOR (WOUND CARE) ×2 IMPLANT
ELECTRODE REM PT RTRN 9FT ADLT (ELECTROSURGICAL) ×2 IMPLANT
GAUZE PACKING 0.25INX5YD STRL (GAUZE/BANDAGES/DRESSINGS) IMPLANT
GAUZE SPONGE 4X4 12PLY STRL (GAUZE/BANDAGES/DRESSINGS) ×2 IMPLANT
GAUZE STRETCH 2X75IN STRL (MISCELLANEOUS) ×2 IMPLANT
GLOVE BIOGEL PI IND STRL 8 (GLOVE) ×4 IMPLANT
GOWN STRL REUS W/ TWL XL LVL3 (GOWN DISPOSABLE) ×2 IMPLANT
GOWN STRL REUS W/TWL MED LVL3 (GOWN DISPOSABLE) ×2 IMPLANT
HANDPIECE VERSAJET DEBRIDEMENT (MISCELLANEOUS) IMPLANT
IV NS 1000ML BAXH (IV SOLUTION) ×2 IMPLANT
KIT TURNOVER KIT A (KITS) ×2 IMPLANT
LABEL OR SOLS (LABEL) ×2 IMPLANT
MANIFOLD NEPTUNE II (INSTRUMENTS) ×2 IMPLANT
MATRIX WOUND MESHED 2X2 (Tissue) IMPLANT
NDL BIOPSY JAMSHIDI 11X6 (NEEDLE) IMPLANT
NDL FILTER BLUNT 18X1 1/2 (NEEDLE) ×2 IMPLANT
NDL HYPO 25X1 1.5 SAFETY (NEEDLE) ×2 IMPLANT
NEEDLE BIOPSY JAMSHIDI 11X6 (NEEDLE) ×2 IMPLANT
NEEDLE FILTER BLUNT 18X1 1/2 (NEEDLE) ×2 IMPLANT
NEEDLE HYPO 25X1 1.5 SAFETY (NEEDLE) ×2 IMPLANT
NS IRRIG 500ML POUR BTL (IV SOLUTION) ×2 IMPLANT
PACK EXTREMITY ARMC (MISCELLANEOUS) ×2 IMPLANT
PACKING GAUZE IODOFORM 1INX5YD (GAUZE/BANDAGES/DRESSINGS) IMPLANT
PAD ABD DERMACEA PRESS 5X9 (GAUZE/BANDAGES/DRESSINGS) ×2 IMPLANT
PENCIL SMOKE EVACUATOR (MISCELLANEOUS) ×2 IMPLANT
SOL .9 NS 3000ML IRR UROMATIC (IV SOLUTION) IMPLANT
SOLUTION PARTIC MCRMTRX 1000MG (Tissue) IMPLANT
SOLUTION PREP PVP 2OZ (MISCELLANEOUS) ×2 IMPLANT
STAPLER SKIN PROX 35W (STAPLE) IMPLANT
STOCKINETTE IMPERV 14X48 (MISCELLANEOUS) IMPLANT
STOCKINETTE IMPERVIOUS 9X36 MD (GAUZE/BANDAGES/DRESSINGS) ×2 IMPLANT
STRIP CLOSURE SKIN 1/2X4 (GAUZE/BANDAGES/DRESSINGS) IMPLANT
SUT ETHILON 2 0 FS 18 (SUTURE) IMPLANT
SUT PROLENE 3 0 PS 2 (SUTURE) IMPLANT
SUT VIC AB 3-0 SH 27X BRD (SUTURE) IMPLANT
SUTURE ETHLN 4-0 FS2 18XMF BLK (SUTURE) IMPLANT
SWAB CULTURE AMIES ANAERIB BLU (MISCELLANEOUS) IMPLANT
SYR 10ML LL (SYRINGE) ×4 IMPLANT
TIP FAN IRRIG PULSAVAC PLUS (DISPOSABLE) IMPLANT
TRAP FLUID SMOKE EVACUATOR (MISCELLANEOUS) ×2 IMPLANT
WATER STERILE IRR 500ML POUR (IV SOLUTION) ×2 IMPLANT

## 2024-07-08 NOTE — Anesthesia Preprocedure Evaluation (Addendum)
 Anesthesia Evaluation  Patient identified by MRN, date of birth, ID band Patient awake    Reviewed: Allergy & Precautions, NPO status , Patient's Chart, lab work & pertinent test results  History of Anesthesia Complications Negative for: history of anesthetic complications  Airway Mallampati: III  TM Distance: <3 FB Neck ROM: full    Dental  (+) Chipped, Missing, Poor Dentition   Pulmonary neg shortness of breath, sleep apnea , former smoker   Pulmonary exam normal        Cardiovascular Exercise Tolerance: Good hypertension, (-) angina + CAD, + Past MI and + Peripheral Vascular Disease  Normal cardiovascular exam     Neuro/Psych  Headaches  Neuromuscular disease  negative psych ROS   GI/Hepatic Neg liver ROS,GERD  Controlled,,  Endo/Other  diabetes    Renal/GU Renal disease  negative genitourinary   Musculoskeletal   Abdominal   Peds  Hematology negative hematology ROS (+)   Anesthesia Other Findings Patient has cardiac clearance for this procedure.   Past Medical History: No date: Allergies No date: Anginal pain (HCC) No date: Bilateral carotid artery disease (HCC) 08/29/2022: Breast pain, left 07/2007: CAD (coronary artery disease)     Comment:  a.) s/p NSTEMI 07/2007 - sig multi-vessel CAD not               amenable to PCI; b.) LHC 06/11/2016: multi vessel CAD not              amenable to PCI --> refer to CVTS; c.) s/p 4v CABG               06/27/2016; d.) LHC 06/09/2023: 75% mLM, 80% o-pLAD, 60%               mLAD, 100% p-mLCx, 100% OM1, 95% pRCA, 100% mRCA, 50%               m-dLAD. LIMA-LAD and SVG-OM1 patent. CTO SVG-RCA with L-R              collaterals - med mgmt No date: Carpal tunnel syndrome on left     Comment:  a.) s/p release 04/2016 No date: CKD (chronic kidney disease), stage III (HCC) 12/09/2022: COVID-19 No date: Diabetic peripheral neuropathy (HCC) No date: Diastolic dysfunction      Comment:  a.) TTE 06/25/2016: EF 60-654%. no RWMAs, G1DD, mild AoV              annular calc; b.) TTE 03/19/2017: EF 50-55%, no RWMAs,               mild MR; c.) TTE 07/22/2019: EF 55-60%, midl LVH, no               RWMAs, triv MR, mild TR 04/30/2021: Dizziness No date: Dyspnea No date: GERD (gastroesophageal reflux disease) No date: Glaucoma No date: Headache No date: History of bilateral cataract extraction No date: HLD (hyperlipidemia) No date: HTN (hypertension) No date: Long term current use of clopidogrel  No date: Long-term use of aspirin  therapy No date: MRSA (methicillin resistant staph aureus) culture positive     Comment:  2006 - 2008 04/04/2016: Myocardial infarction (HCC) 07/27/2007: NSTEMI (non-ST elevated myocardial infarction) (HCC)     Comment:  a.) LHC 07/30/2007: 30% mLAD, 99% D1, 50% pLCx, 40%               mLCx, 70/75/85% dLCx, 50% OM1, 75/80/80 OM2, 50% pRCA,  60% mRCA, 85% dRCA --> not amenable to PCI No date: OSA on CPAP No date: Osteoarthritis No date: PAD (peripheral artery disease) (HCC)     Comment:  a. 03/2022 Lower Ext Angio: No signif Ao-iliac dzs. R PT               diff dzs and occluded above ankle. R Pedal arch intact w/              excellent antegrade flow provided by R AT-->no indication              for revasc. 07/11/2020: Pilonidal cyst 02/29/2020: Pruritus 06/27/2016: S/P CABG x 4     Comment:  a.) LIMA-LAD, SVG-RCA, seq SVG-OM1-OM2 No date: S/P tube myringotomy No date: Skin cancer, basal cell No date: Skin ulcer of right great toe, unspecified ulcer stage (HCC) No date: T2DM (type 2 diabetes mellitus) (HCC) No date: Wears glasses No date: Wears partial dentures     Comment:  top  Past Surgical History: 03/26/2022: ABDOMINAL AORTOGRAM W/LOWER EXTREMITY; N/A     Comment:  Procedure: ABDOMINAL AORTOGRAM W/LOWER EXTREMITY;                Surgeon: Darron Deatrice LABOR, MD;  Location: [facility]              LAB;  Service:  Cardiovascular;  Laterality: N/A; 06/26/2023: AMPUTATION TOE; Bilateral     Comment:  Procedure: AMPUTATION TOE MPJ OF THE JOINT,PARTIAL TOE               AMPUTUATION;  Surgeon: Janit Thresa HERO, DPM;  Location:               [facility] ORS;  Service: Orthopedics/Podiatry;  Laterality:               Bilateral; No date: APPENDECTOMY 06/11/2016: CARDIAC CATHETERIZATION; N/A     Comment:  Procedure: Left Heart Cath and Coronary Angiography;                Surgeon: Wolm JINNY Rhyme, MD;  Location: [facility] INVASIVE               CV LAB;  Service: Cardiovascular;  Laterality: N/A; 04/24/2016: CARPAL TUNNEL RELEASE; Left     Comment:  Procedure: CARPAL TUNNEL RELEASE;  Surgeon: Ozell Flake, MD;  Location: [facility] ORS;  Service: Orthopedics;                Laterality: Left; No date: CATARACT EXTRACTION W/ INTRAOCULAR LENS  IMPLANT, BILATERAL;  Bilateral No date: CATARACT EXTRACTION, BILATERAL     Comment:  R eye 07/16/12, L eye 08/13/12 - with lens implant in               both eyes 06/27/2016: CORONARY ARTERY BYPASS GRAFT; N/A     Comment:  Procedure: CORONARY ARTERY BYPASS GRAFTING times four               using left internal mammary artery and right leg               saphenous vein;  Surgeon: Maude Fleeta Ochoa, MD;  Location:              [facility] OR;  Service: Open Heart Surgery;  Laterality: N/A; 1961: FOREARM FRACTURE SURGERY; Left 02/11/2016: KNEE ARTHROPLASTY; Left     Comment:  Procedure: COMPUTER ASSISTED TOTAL KNEE ARTHROPLASTY;  Surgeon: Lynwood SHAUNNA Hue, MD;  Location: [facility] ORS;                Service: Orthopedics;  Laterality: Left; 2000: KNEE ARTHROSCOPY; Right 07/30/2007: LEFT HEART CATH AND CORONARY ANGIOGRAPHY; Left     Comment:  Procedure: LEFT HEART CATH AND CORONARY ANGIOGRAPHY;               Location: [facility]; Surgeon: Wolm Rhyme, MD 01/13/2017: LEFT HEART CATH AND CORS/GRAFTS ANGIOGRAPHY; Left     Comment:  Procedure: Left Heart Cath and Cors/Grafts Angiography;                Surgeon: Wolm JINNY Rhyme, MD;  Location: [facility] INVASIVE               CV LAB;  Service: Cardiovascular;  Laterality: Left; 06/09/2023: LEFT HEART CATH AND CORS/GRAFTS ANGIOGRAPHY; Left     Comment:  Procedure: LEFT HEART CATH AND CORS/GRAFTS ANGIOGRAPHY;               Surgeon: Ammon Blunt, MD;  Location: [facility]               INVASIVE CV LAB;  Service: Cardiovascular;  Laterality:               Left; 03/21/2020: METATARSAL HEAD EXCISION; Right     Comment:  Procedure: METATARSAL HEAD RESECTION RIGHT AND               DEBRIDEMENT OF ULCER;  Surgeon: Janit Thresa HERO, DPM;                Location: [facility] OR;  Service: Podiatry;  Laterality: Right; 09/15/2023: METATARSAL HEAD EXCISION; Left     Comment:  Procedure: METATARSAL HEAD EXCISION;  Surgeon: Janit Thresa HERO, DPM;  Location: [facility] ORS;  Service:               Orthopedics/Podiatry;  Laterality: Left; No date: MULTIPLE TOOTH EXTRACTIONS 06/27/2016: TEE WITHOUT CARDIOVERSION; N/A     Comment:  Procedure: TRANSESOPHAGEAL ECHOCARDIOGRAM (TEE);                Surgeon: Maude Fleeta Ochoa, MD;  Location: [facility] OR;  Service:              Open Heart Surgery;  Laterality: N/A;     Reproductive/Obstetrics negative OB ROS                              Anesthesia Physical Anesthesia Plan  ASA: 3  Anesthesia Plan: General   Post-op Pain Management:    Induction: Intravenous  PONV Risk Score and Plan: Propofol  infusion, TIVA, Dexamethasone  and Ondansetron   Airway Management Planned: Natural Airway and Nasal Cannula  Additional Equipment:   Intra-op Plan:   Post-operative Plan:   Informed Consent: I have reviewed the patients History and Physical, chart, labs and discussed the procedure including the risks, benefits and alternatives for the proposed anesthesia with the patient or authorized representative who has indicated his/her understanding and acceptance.     Dental Advisory  Given  Plan Discussed with: Anesthesiologist, CRNA and Surgeon  Anesthesia Plan Comments: (Patient consented for risks of anesthesia including but not limited to:  - adverse reactions to medications - risk of airway placement if required - damage to eyes, teeth, lips or other oral mucosa - nerve damage due to positioning  -  sore throat or hoarseness - Damage to heart, brain, nerves, lungs, other parts of body or loss of life  Patient voiced understanding and assent.)        Anesthesia Quick Evaluation

## 2024-07-08 NOTE — Op Note (Signed)
 OPERATIVE REPORT Patient name: Lawrence [name] MRN: [id number] DOB: 09-09-1951  DOS: 07/08/24  Preop Dx: Ulcer RT third toe. Abscess left foot. Osteomyelitis left first metatarsal Postop Dx: same  Procedure:  1.  Preparation of wound bed for application of wound graft right third toe ulcer 2.  Incision and drainage left plantar forefoot amputation stump 3.  Bone biopsy first metatarsal left foot  Surgeon: Thresa EMERSON Sar DPM  Anesthesia: Monitored anesthesia care  Hemostasis: None  EBL: 10 mL Materials: Integra bilayer dermal wound matrix 2x2in. Acell powder 1 g Injectables: None Pathology: Culture left foot abscess.  Soft tissue culture left foot.  Bone biopsy distal first metatarsal sent for BOTH path and culture.   Condition: The patient tolerated the procedure and anesthesia well. No complications noted or reported   Justification for procedure: The patient is a 73 y.o. male who presents today for surgical correction of chronic ulcer to the right third toe as well as abscess to the left foot with concern for underlying osteomyelitis of the first metatarsal. All conservative modalities of been unsuccessful in providing any sort of satisfactory alleviation of symptoms with the patient. The patient was told benefits as well as possible side effects of the surgery. The patient consented for surgical correction. The patient consent form was reviewed. All patient questions were answered. No guarantees were expressed or implied. The patient and the surgeon both signed the patient consent form with the witness present and placed in the patient's chart.   Procedure in Detail: The patient was brought to the operating room, placed in the operating table in the supine position at which time an aseptic scrub and drape were performed about the patient's respective lower extremity after anesthesia was induced as described above. Attention was then directed to the surgical area where procedure  number one commenced.  Procedure #1: Preparation of wound bed for application of graft right third toe ulcer Excisional debridement of the right third toe ulcer was performed using a combination of rongeur and sharp debridement with a #15 scalpel.  Excisional debridement of the necrotic nonviable tissue down to healthier bleeding viable tissue was performed.  The wound measures approximately 1.0 x 1.0 x 0.3 cm.  The wound bed was prepared and necrotic tissue was removed in preparation for the application of the wound graft.  Integra bilayer wound matrix graft was applied to the right third toe amputation stump ulcer and reinforced with Steri-Strips followed by dry sterile dressing.  Procedure #2: Incision and drainage left foot abscess Attention was then directed to the plantar aspect of the first metatarsal of the left foot at the previously diagnosed draining sinus and underlying abscess.  A #15 scalpel was utilized to make a small incision to the plantar aspect of the first metatarsal at the area of the draining sinus.  Serosanguineous drainage was immediately expressed from the area.  Necrotic nonviable tissue within the area was excisionally debrided away.  There was a significant amount of undermining which was excisionally debrided using a rongeur.  Soft tissue culture was harvested in addition to soft tissue swab sent for C&S.  Irrigation was utilized and ACell powder was packed within the abscess site.  Attention was then directed to the dorsal aspect of the foot where procedure #3 commenced  Procedure #3: Bone biopsy distal first metatarsal left foot A Jamshidi 2 mm biopsy needle was utilized to harvest 2 separate specimens of the distal first metatarsal percutaneously through the dorsum of the foot.  The specimens were placed in 2 separate specimen containers and sent pathology for both cultures and pathology.  Dry sterile compressive dressings were then applied to all previously mentioned  surgical areas.  The patient was then transferred from the operating room to the recovery room having tolerated the procedure and anesthesia well. All vital signs are stable. After a brief stay in the recovery room the patient was readmitted to inpatient room with postoperative orders placed.     Thresa EMERSON Sar, DPM [facility]  Dr. Thresa EMERSON Sar, DPM    [address] [place], [place] [address]                Office [phone]  Fax [phone]

## 2024-07-08 NOTE — Anesthesia Postprocedure Evaluation (Signed)
 Anesthesia Post Note  Patient: Lawrence [name]  Procedure(s) Performed: IRRIGATION AND DEBRIDEMENT ULCER, WITH GRAFT APPLICATION RIGHT THIRD TOE (Right) IRRIGATION AND DEBRIDEMENT FOOT AMPUTATION STUMB, BONE BIOPSY FIRST METATARSAL (Left)  Patient location during evaluation: PACU Anesthesia Type: General Level of consciousness: awake and alert Pain management: pain level controlled Vital Signs Assessment: post-procedure vital signs reviewed and stable Respiratory status: spontaneous breathing, nonlabored ventilation, respiratory function stable and patient connected to nasal cannula oxygen  Cardiovascular status: blood pressure returned to baseline and stable Postop Assessment: no apparent nausea or vomiting Anesthetic complications: no   No notable events documented.   Last Vitals:  Vitals:   07/08/24 1645 07/08/24 1711  BP: (!) 134/50 (!) 158/61  Pulse: 63 63  Resp: 13 16  Temp:    SpO2: 100% 99%    Last Pain:  Vitals:   07/08/24 1711  TempSrc:   PainSc: 0-No pain                 Debby Mines

## 2024-07-08 NOTE — Transfer of Care (Signed)
 Immediate Anesthesia Transfer of Care Note  Patient: Lawrence [name]  Procedure(s) Performed: IRRIGATION AND DEBRIDEMENT ULCER, WITH GRAFT APPLICATION RIGHT THIRD TOE (Right) IRRIGATION AND DEBRIDEMENT FOOT AMPUTATION STUMB, BONE BIOPSY FIRST METATARSAL (Left)  Patient Location: PACU  Anesthesia Type:General  Level of Consciousness: awake, alert , and oriented  Airway & Oxygen  Therapy: Patient Spontanous Breathing and Patient connected to face mask oxygen   Post-op Assessment: Report given to RN and Post -op Vital signs reviewed and stable  Post vital signs: Reviewed and stable  Last Vitals:  Vitals Value Taken Time  BP 143/56 07/08/24 16:01  Temp 35.8   Pulse 61 07/08/24 16:04  Resp 17 07/08/24 16:04  SpO2 100 % 07/08/24 16:04  Vitals shown include unfiled device data.  Last Pain:  Vitals:   07/08/24 1336  TempSrc: Temporal         Complications: No notable events documented.

## 2024-07-08 NOTE — Brief Op Note (Signed)
 07/08/2024  4:08 PM  PATIENT:  Lawrence [name]  73 y.o. 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 Swab culture abscess left foot. Bone biopsy first metatarsal sent for BOTH path and culture  DISPOSITION OF SPECIMEN:  PATHOLOGY  COUNTS:  YES  TOURNIQUET:  * No tourniquets in log *  DICTATION: .Dragon Dictation  PLAN OF CARE: Discharge to home after PACU  PATIENT DISPOSITION:  PACU - hemodynamically stable.   Delay start of Pharmacological VTE agent (>24hrs) due to surgical blood loss or risk of bleeding: no  Thresa EMERSON Janit, DPM [facility]  Dr. Thresa EMERSON Janit, DPM    [address] [place], [place] [address]                Office [phone]  Fax [phone]

## 2024-07-08 NOTE — Discharge Instructions (Signed)
 Leave dressings clean dry and intact until follow-up in the office next week.  Weightbearing as tolerated bilateral lower extremities in postop shoes.

## 2024-07-08 NOTE — Interval H&P Note (Signed)
 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  The various methods of treatment have been discussed with the patient and family. After consideration of risks, benefits and other options for treatment, the patient has consented to  Procedure(s): IRRIGATION AND DEBRIDEMENT ABSCESS (Right) IRRIGATION AND DEBRIDEMENT FOOT (Right) as a surgical intervention.  The patient's history has been reviewed, patient examined, no change in status, stable for surgery.  I have reviewed the patient's chart and labs.  Questions were answered to the patient's satisfaction.     Thresa CHRISTELLA Sar

## 2024-07-11 ENCOUNTER — Ambulatory Visit (INDEPENDENT_AMBULATORY_CARE_PROVIDER_SITE_OTHER): Admitting: Vascular Surgery

## 2024-07-11 ENCOUNTER — Encounter: Payer: Self-pay | Admitting: Podiatry

## 2024-07-11 ENCOUNTER — Encounter (INDEPENDENT_AMBULATORY_CARE_PROVIDER_SITE_OTHER)

## 2024-07-12 LAB — SURGICAL PATHOLOGY

## 2024-07-13 ENCOUNTER — Encounter: Payer: Self-pay | Admitting: Podiatry

## 2024-07-13 LAB — AEROBIC/ANAEROBIC CULTURE W GRAM STAIN (SURGICAL/DEEP WOUND)
Culture: NO GROWTH
Gram Stain: NONE SEEN

## 2024-07-15 ENCOUNTER — Ambulatory Visit: Admitting: Podiatry

## 2024-07-15 DIAGNOSIS — M86472 Chronic osteomyelitis with draining sinus, left ankle and foot: Secondary | ICD-10-CM

## 2024-07-15 MED ORDER — DOXYCYCLINE HYCLATE 100 MG PO TABS: 100.0000 mg | ORAL_TABLET | Freq: Two times a day (BID) | ORAL | 0 refills | Status: DC

## 2024-07-15 NOTE — Progress Notes (Signed)
 Chief Complaint  Patient presents with   Routine Post Op    POV # 1 DOS 07/08/24 RT 3RD TO DEBRIDEMENT OF ULCER W/ GRAFT APPLICATION, INCISION AND DRAINAGE LEFT FOOT, BONE BIOPSY 1ST METATARSAL LEFT. No pain expressed.    Subjective:  Patient presents today status post B/L foot surgery.  DOS: 07/08/2024.  Past Medical History:  Diagnosis Date   Allergies    Anginal pain (HCC)    Aortic atherosclerosis (HCC)    Bilateral carotid artery disease (HCC)    Breast pain, left 08/29/2022   CAD (coronary artery disease) 07/2007   a.) s/p NSTEMI 07/2007 - sig multi-vessel CAD not amenable to PCI; b.) LHC 06/11/2016: multi vessel CAD not amenable to PCI --> refer to CVTS; c.) s/p 4v CABG 06/27/2016; d.) LHC 06/09/2023: 75% mLM, 80% o-pLAD, 60% mLAD, 100% p-mLCx, 100% OM1, 95% pRCA, 100% mRCA, 50% m-dLAD. LIMA-LAD and SVG-OM1 patent. CTO SVG-RCA with L-R collaterals - med mgmt   Carpal tunnel syndrome on left    a.) s/p release 04/2016   Cerebral microvascular disease    CKD (chronic kidney disease), stage III (HCC)    COVID-19 12/09/2022   Diabetic peripheral neuropathy (HCC)    Diastolic dysfunction    a.) TTE 06/25/2016: EF 60-654%. no RWMAs, G1DD, mild AoV annular calc; b.) TTE 03/19/2017: EF 50-55%, no RWMAs, mild MR; c.) TTE 07/22/2019: EF 55-60%, midl LVH, no RWMAs, triv MR, mild TR   Dizziness 04/30/2021   Dyspnea    GERD (gastroesophageal reflux disease)    Glaucoma    Headache    History of bilateral cataract extraction    HLD (hyperlipidemia)    HTN (hypertension)    Long term current use of clopidogrel     Long-term use of aspirin  therapy    MRSA (methicillin resistant staph aureus) culture positive    2006 - 2008   NSTEMI (non-ST elevated myocardial infarction) (HCC) 07/27/2007   a.) LHC 07/30/2007: 30% mLAD, 99% D1, 50% pLCx, 40% mLCx, 70/75/85% dLCx, 50% OM1, 75/80/80 OM2, 50% pRCA, 60% mRCA, 85% dRCA --> not amenable to PCI   OSA on CPAP    Osteoarthritis    Osteomyelitis  of left foot (HCC)    PAD (peripheral artery disease) (HCC)    a. 03/2022 Lower Ext Angio: No signif Ao-iliac dzs. R PT diff dzs and occluded above ankle. R Pedal arch intact w/ excellent antegrade flow provided by R AT-->no indication for revasc.   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   a.) LIMA-LAD, SVG-RCA, seq SVG-OM1-OM2   S/P tube myringotomy    Skin cancer, basal cell    Skin ulcer of right great toe, unspecified ulcer stage (HCC)    T1DM (type 1 diabetes mellitus) (HCC)    Wears glasses    Wears partial dentures    top    Past Surgical History:  Procedure Laterality Date   ABDOMINAL AORTOGRAM W/LOWER EXTREMITY N/A 03/26/2022   Procedure: ABDOMINAL AORTOGRAM W/LOWER EXTREMITY;  Surgeon: Darron Deatrice LABOR, MD;  [facility];  Service: Cardiovascular;  Laterality: N/A;   AMPUTATION TOE Bilateral 06/26/2023   Procedure: AMPUTATION TOE MPJ OF THE JOINT,PARTIAL TOE AMPUTUATION;  Surgeon: Janit Thresa HERO, DPM;  Location: [facility];  Service: Orthopedics/Podiatry;  Laterality: Bilateral;   AMPUTATION TOE Right 12/04/2023   Procedure: AMPUTATION TOE INTERPHALANGEAL 3RD;  Surgeon: Janit Thresa HERO, DPM;  Location: [facility];  Service: Orthopedics/Podiatry;  Laterality: Right;   APPENDECTOMY     CARDIAC CATHETERIZATION N/A 06/11/2016   Procedure: Left Heart Cath and Coronary Angiography;  Surgeon: Wolm JINNY Rhyme, MD;  Location: [facility];  Service: Cardiovascular;  Laterality: N/A;   CARPAL TUNNEL RELEASE Left 04/24/2016   Procedure: CARPAL TUNNEL RELEASE;  Surgeon: Ozell Flake, MD;  Location: [facility];  Service: Orthopedics;  Laterality: Left;   CATARACT EXTRACTION W/ INTRAOCULAR LENS IMPLANT Right 07/16/2012   CATARACT EXTRACTION W/ INTRAOCULAR LENS IMPLANT Left 08/13/2012   CORONARY ARTERY BYPASS GRAFT N/A 06/27/2016   Procedure: CORONARY ARTERY BYPASS GRAFTING times four using left internal mammary artery and  right leg saphenous vein;  Surgeon: Maude Fleeta Ochoa, MD;  Location: [facility];  Service: Open Heart Surgery;  Laterality: N/A;   FOREARM FRACTURE SURGERY Left 1961   IRRIGATION AND DEBRIDEMENT ABSCESS Right 07/08/2024   Procedure: IRRIGATION AND DEBRIDEMENT ULCER, WITH GRAFT APPLICATION RIGHT THIRD TOE;  Surgeon: Janit Thresa HERO, DPM;  Location: [facility];  Service: Orthopedics/Podiatry;  Laterality: Right;   IRRIGATION AND DEBRIDEMENT FOOT Left 07/08/2024   Procedure: IRRIGATION AND DEBRIDEMENT FOOT AMPUTATION STUMB, BONE BIOPSY FIRST METATARSAL;  Surgeon: Janit Thresa HERO, DPM;  Location: [facility];  Service: Orthopedics/Podiatry;  Laterality: Left;   KNEE ARTHROPLASTY Left 02/11/2016   Procedure: COMPUTER ASSISTED TOTAL KNEE ARTHROPLASTY;  Surgeon: Lynwood SHAUNNA Hue, MD;  Location: [facility];  Service: Orthopedics;  Laterality: Left;   KNEE ARTHROSCOPY Right 2000   LEFT HEART CATH AND CORONARY ANGIOGRAPHY Left 07/30/2007   Procedure: LEFT HEART CATH AND CORONARY ANGIOGRAPHY; Location: [facility]; Surgeon: Wolm Rhyme, MD   LEFT HEART CATH AND CORS/GRAFTS ANGIOGRAPHY Left 01/13/2017   Procedure: Left Heart Cath and Cors/Grafts Angiography;  Surgeon: Wolm JINNY Rhyme, MD;  Location: [facility];  Service: Cardiovascular;  Laterality: Left;   LEFT HEART CATH AND CORS/GRAFTS ANGIOGRAPHY Left 06/09/2023   Procedure: LEFT HEART CATH AND CORS/GRAFTS ANGIOGRAPHY;  Surgeon: Ammon Blunt, MD;  Location: [facility];  Service: Cardiovascular;  Laterality: Left;   LOWER EXTREMITY ANGIOGRAPHY Left 02/23/2024   Procedure: Lower Extremity Angiography;  Surgeon: Jama Cordella MATSU, MD;  Location: [facility];  Service: Cardiovascular;  Laterality: Left;   LOWER EXTREMITY ANGIOGRAPHY Right 05/03/2024   Procedure: Lower Extremity Angiography;  Surgeon: Jama Cordella MATSU, MD;  Location: [facility];  Service: Cardiovascular;  Laterality: Right;   LOWER EXTREMITY INTERVENTION Left  02/23/2024   Procedure: LOWER EXTREMITY INTERVENTION;  Surgeon: Jama Cordella MATSU, MD;  Location: [facility];  Service: Cardiovascular;  Laterality: Left;   METATARSAL HEAD EXCISION Right 03/21/2020   Procedure: METATARSAL HEAD RESECTION RIGHT AND DEBRIDEMENT OF ULCER;  Surgeon: Janit Thresa HERO, DPM;  Location: [facility];  Service: Podiatry;  Laterality: Right;   METATARSAL HEAD EXCISION Left 09/15/2023   Procedure: METATARSAL HEAD EXCISION;  Surgeon: Janit Thresa HERO, DPM;  Location: [facility];  Service: Orthopedics/Podiatry;  Laterality: Left;   MULTIPLE TOOTH EXTRACTIONS     TEE WITHOUT CARDIOVERSION N/A 06/27/2016   Procedure: TRANSESOPHAGEAL ECHOCARDIOGRAM (TEE);  Surgeon: Maude Fleeta Ochoa, MD;  Location[facility];  Service: Open Heart Surgery;  Laterality: N/A;   TENDON LENGTHENING Left 12/04/2023   Procedure: TENDON-ACHILLES LENGTHENING;  Surgeon: Janit Thresa HERO, DPM;  Location: [facility];  Service: Orthopedics/Podiatry;  Laterality: Left;   TRANSMETATARSAL AMPUTATION Left 12/04/2023   Procedure: TRANSMETATARSAL AMPUTATION;  Surgeon: Janit Thresa HERO, DPM;  Location: [facility];  Service: Orthopedics/Podiatry;  Laterality: Left;  Allergies  Allergen Reactions   Latex Other (See Comments)    Blister and burning (67M insulin  pump latex band aid )   Nitroglycerin  Nausea Only and Other (See Comments)    Patches only - headache   Statins Other (See Comments)    BODY PAIN. Taking rosuvastatin  in the evenings and tolerating.    Fluvastatin Other (See Comments)    Muscle pain   Fluvastatin Sodium Other (See Comments)    Myalgias   Percocet [Oxycodone -Acetaminophen ] Itching   Rosuvastatin  Other (See Comments)    Myalgias. Taking in evening and tolerating   Simvastatin Other (See Comments)    Muscle pain, Myalgias   Lipitor [Atorvastatin] Other (See Comments) and Rash    BODY PAIN   Lisinopril  Nausea Only and Other (See Comments)    Other reaction(s): Dizziness   Zetia [Ezetimibe] Other  (See Comments)    Muscle Pain    Objective/Physical Exam Graft is well adhered and intact to the amputation stump of the right third toe.  No drainage.  Open incision noted to the plantar aspect of the left forefoot amputation stump approximately 1.5 cm in length.  No active bleeding.  There is some maceration to the area.  Appears stable and intact  MR FOOT LEFT WO CONTRAST 06/06/2024 IMPRESSION: 1. Marrow edema in the distal 3 cm of the first metatarsal remnant is worrisome for osteomyelitis. Minimal marrow edema in the distal 1.5-2 cm of the second and third metatarsal remnants is nonspecific and could be reactive or due to early osteomyelitis. 2. Fluid collection anterior and subjacent to the first metatarsal remnant is worrisome for abscess. 3. Diffuse subcutaneous edema about the imaged foot could be due to cellulitis or dependent change.  Aerobic/Anaerobic Culture w Gram Stain (surgical/deep wound) Order: [id number]  Status: Final result     Next appt: 07/26/2024 at 10:00 AM in Podiatry [facility] CHRISTELLA Sar, [place])   Test Result Released: Yes (not seen)   Specimen Information: Path Tissue  0 Result Notes    Component Ref Range & Units (hover) 7 d ago  Specimen Description TISSUE Performed at [facility], [address] [place], [place] [address]  Special Requests NONE Performed at [facility], [address] [place], [place] [address]  Gram Stain NO WBC SEEN NO ORGANISMS SEEN  Culture RARE STAPHYLOCOCCUS SAPROPHYTICUS NO ANAEROBES ISOLATED Performed at [facility], [address] [place], [place] [address]  Report Status 07/13/2024 FINAL  Organism ID, Bacteria STAPHYLOCOCCUS SAPROPHYTICUS  Resulting Agency [facility]     Susceptibility   Staphylococcus saprophyticus    MIC    CIPROFLOXACIN  <=0.5 SENSI... Sensitive    CLINDAMYCIN >=8 RESISTANT Resistant    ERYTHROMYCIN >=8 RESISTANT Resistant    GENTAMICIN  <=0.5 SENSI... Sensitive     Inducible Clindamycin NEGATIVE Sensitive    OXACILLIN 2 RESISTANT Resistant    RIFAMPIN <=0.5 SENSI... Sensitive    TETRACYCLINE <=1 SENSITIVE Sensitive    TRIMETH /SULFA  <=10 SENSIT... Sensitive    VANCOMYCIN  <=0.5 SENSI... Sensitive           Susceptibility Comments  Staphylococcus saprophyticus  RARE STAPHYLOCOCCUS SAPROPHYTICUS     Aerobic/Anaerobic Culture w Gram Stain (surgical/deep wound) Order: [id number]  Status: Final result     Next appt: 07/26/2024 at 10:00 AM in Podiatry [facility] CHRISTELLA Sar, [place])   Test Result Released: Yes (not seen)   Specimen Information: Abscess  0 Result Notes  important suggestion  Newer results are available. Click to view them now.  Component Ref Range & Units (hover) 7 d ago  Specimen Description ABSCESS Performed at [facility], [address] [place], [place] [address]  Special Requests NONE Performed at [facility], [address] [place], [place] [address]  Gram Stain RARE WBC PRESENT, PREDOMINANTLY PMN NO ORGANISMS SEEN  Culture RARE ENTEROCOCCUS FAECALIS VANCOMYCIN  RESISTANT ENTEROCOCCUS ISOLATED NO ANAEROBES ISOLATED Performed at [facility], [address], [place], [place] [address]  Report Status 07/13/2024 FINAL  Organism ID, Bacteria ENTEROCOCCUS FAECALIS  Resulting Agency [facility]     Susceptibility   Enterococcus faecalis    MIC    AMPICILLIN <=2 SENSITIVE Sensitive    GENTAMICIN  SYNERGY RESISTANT Resistant    VANCOMYCIN  >=32 RESIST... Resistant           Susceptibility Comments  Enterococcus faecalis  RARE ENTEROCOCCUS FAECALIS     Specimen Collected: 07/08/24 15:34    Assessment: POV # 1 DOS 07/08/24 RT 3RD TO DEBRIDEMENT OF ULCER W/ GRAFT APPLICATION, INCISION AND DRAINAGE LEFT FOOT, BONE BIOPSY 1ST METATARSAL LEFT.    Plan of Care:  -Patient was evaluated.  -Culture results reviewed -Referral placed for infectious disease consult.  The patient has had multiple  amputation surgeries and for now we will pursue long-term antibiotic care.  Greatly appreciated -Patient has not been on antibiotics for about 2 weeks now.  Prescription for doxycycline  100 mg twice daily x 10 days.  Modify as per ID recs which may likely include long-term IV abx -Continue WBAT surgical shoes bilateral -Return to clinic 2 weeks  Thresa EMERSON Sar, DPM [facility]  Dr. Thresa EMERSON Sar, DPM    [address] [place], [place] [address]                Office [phone]  Fax [phone]

## 2024-07-21 ENCOUNTER — Ambulatory Visit: Admitting: Podiatry

## 2024-07-26 ENCOUNTER — Ambulatory Visit (INDEPENDENT_AMBULATORY_CARE_PROVIDER_SITE_OTHER): Admitting: Podiatry

## 2024-07-26 DIAGNOSIS — M86472 Chronic osteomyelitis with draining sinus, left ankle and foot: Secondary | ICD-10-CM

## 2024-07-26 NOTE — Progress Notes (Signed)
 Chief Complaint  Patient presents with   Routine Post Op    POV #2 DOS 07/08/24 RT 3RD TO DEBRIDEMENT OF ULCER W/ GRAFT APPLICATION, INCISION AND DRAINAGE LEFT FOOT, BONE BIOPSY 1ST METATARSAL LEFT, he states everything is going well, still has some drainage from the right second toe.    Subjective:  Patient presents today status post B/L foot surgery.  DOS: 07/08/2024.  Past Medical History:  Diagnosis Date   Allergies    Anginal pain    Aortic atherosclerosis    Bilateral carotid artery disease    Breast pain, left 08/29/2022   CAD (coronary artery disease) 07/2007   a.) s/p NSTEMI 07/2007 - sig multi-vessel CAD not amenable to PCI; b.) LHC 06/11/2016: multi vessel CAD not amenable to PCI --> refer to CVTS; c.) s/p 4v CABG 06/27/2016; d.) LHC 06/09/2023: 75% mLM, 80% o-pLAD, 60% mLAD, 100% p-mLCx, 100% OM1, 95% pRCA, 100% mRCA, 50% m-dLAD. LIMA-LAD and SVG-OM1 patent. CTO SVG-RCA with L-R collaterals - med mgmt   Carpal tunnel syndrome on left    a.) s/p release 04/2016   Cerebral microvascular disease    CKD (chronic kidney disease), stage III (HCC)    COVID-19 12/09/2022   Diabetic peripheral neuropathy (HCC)    Diastolic dysfunction    a.) TTE 06/25/2016: EF 60-654%. no RWMAs, G1DD, mild AoV annular calc; b.) TTE 03/19/2017: EF 50-55%, no RWMAs, mild MR; c.) TTE 07/22/2019: EF 55-60%, midl LVH, no RWMAs, triv MR, mild TR   Dizziness 04/30/2021   Dyspnea    GERD (gastroesophageal reflux disease)    Glaucoma    Headache    History of bilateral cataract extraction    HLD (hyperlipidemia)    HTN (hypertension)    Long term current use of clopidogrel     Long-term use of aspirin  therapy    MRSA (methicillin resistant staph aureus) culture positive    2006 - 2008   NSTEMI (non-ST elevated myocardial infarction) (HCC) 07/27/2007   a.) LHC 07/30/2007: 30% mLAD, 99% D1, 50% pLCx, 40% mLCx, 70/75/85% dLCx, 50% OM1, 75/80/80 OM2, 50% pRCA, 60% mRCA, 85% dRCA --> not amenable to PCI    OSA on CPAP    Osteoarthritis    Osteomyelitis of left foot (HCC)    PAD (peripheral artery disease)    a. 03/2022 Lower Ext Angio: No signif Ao-iliac dzs. R PT diff dzs and occluded above ankle. R Pedal arch intact w/ excellent antegrade flow provided by R AT-->no indication for revasc.   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   a.) LIMA-LAD, SVG-RCA, seq SVG-OM1-OM2   S/P tube myringotomy    Skin cancer, basal cell    Skin ulcer of right great toe, unspecified ulcer stage (HCC)    T1DM (type 1 diabetes mellitus) (HCC)    Wears glasses    Wears partial dentures    top    Past Surgical History:  Procedure Laterality Date   ABDOMINAL AORTOGRAM W/LOWER EXTREMITY N/A 03/26/2022   Procedure: ABDOMINAL AORTOGRAM W/LOWER EXTREMITY;  Surgeon: Darron Deatrice LABOR, MD;  [facility];  Service: Cardiovascular;  Laterality: N/A;   AMPUTATION TOE Bilateral 06/26/2023   Procedure: AMPUTATION TOE MPJ OF THE JOINT,PARTIAL TOE AMPUTUATION;  Surgeon: Janit Thresa HERO, DPM;  Location: [facility];  Service: Orthopedics/Podiatry;  Laterality: Bilateral;   AMPUTATION TOE Right 12/04/2023   Procedure: AMPUTATION TOE INTERPHALANGEAL 3RD;  Surgeon: Janit Thresa HERO, DPM;  Location: [facility];  Service: Orthopedics/Podiatry;  Laterality: Right;   APPENDECTOMY     CARDIAC CATHETERIZATION N/A 06/11/2016   Procedure: Left Heart Cath and Coronary Angiography;  Surgeon: Wolm JINNY Rhyme, MD;  Location: [facility];  Service: Cardiovascular;  Laterality: N/A;   CARPAL TUNNEL RELEASE Left 04/24/2016   Procedure: CARPAL TUNNEL RELEASE;  Surgeon: Ozell Flake, MD;  Location: [facility];  Service: Orthopedics;  Laterality: Left;   CATARACT EXTRACTION W/ INTRAOCULAR LENS IMPLANT Right 07/16/2012   CATARACT EXTRACTION W/ INTRAOCULAR LENS IMPLANT Left 08/13/2012   CORONARY ARTERY BYPASS GRAFT N/A 06/27/2016   Procedure: CORONARY ARTERY BYPASS GRAFTING times  four using left internal mammary artery and right leg saphenous vein;  Surgeon: Maude Fleeta Ochoa, MD;  Location: [facility];  Service: Open Heart Surgery;  Laterality: N/A;   FOREARM FRACTURE SURGERY Left 1961   IRRIGATION AND DEBRIDEMENT ABSCESS Right 07/08/2024   Procedure: IRRIGATION AND DEBRIDEMENT ULCER, WITH GRAFT APPLICATION RIGHT THIRD TOE;  Surgeon: Janit Thresa HERO, DPM;  Location: [facility];  Service: Orthopedics/Podiatry;  Laterality: Right;   IRRIGATION AND DEBRIDEMENT FOOT Left 07/08/2024   Procedure: IRRIGATION AND DEBRIDEMENT FOOT AMPUTATION STUMB, BONE BIOPSY FIRST METATARSAL;  Surgeon: Janit Thresa HERO, DPM;  Location: [facility];  Service: Orthopedics/Podiatry;  Laterality: Left;   KNEE ARTHROPLASTY Left 02/11/2016   Procedure: COMPUTER ASSISTED TOTAL KNEE ARTHROPLASTY;  Surgeon: Lynwood SHAUNNA Hue, MD;  Location: [facility];  Service: Orthopedics;  Laterality: Left;   KNEE ARTHROSCOPY Right 2000   LEFT HEART CATH AND CORONARY ANGIOGRAPHY Left 07/30/2007   Procedure: LEFT HEART CATH AND CORONARY ANGIOGRAPHY; Location: [facility]; Surgeon: Wolm Rhyme, MD   LEFT HEART CATH AND CORS/GRAFTS ANGIOGRAPHY Left 01/13/2017   Procedure: Left Heart Cath and Cors/Grafts Angiography;  Surgeon: Wolm JINNY Rhyme, MD;  Location: [facility];  Service: Cardiovascular;  Laterality: Left;   LEFT HEART CATH AND CORS/GRAFTS ANGIOGRAPHY Left 06/09/2023   Procedure: LEFT HEART CATH AND CORS/GRAFTS ANGIOGRAPHY;  Surgeon: Ammon Blunt, MD;  Location: [facility];  Service: Cardiovascular;  Laterality: Left;   LOWER EXTREMITY ANGIOGRAPHY Left 02/23/2024   Procedure: Lower Extremity Angiography;  Surgeon: Jama Cordella MATSU, MD;  Location: [facility];  Service: Cardiovascular;  Laterality: Left;   LOWER EXTREMITY ANGIOGRAPHY Right 05/03/2024   Procedure: Lower Extremity Angiography;  Surgeon: Jama Cordella MATSU, MD;  Location: [facility];  Service: Cardiovascular;  Laterality: Right;    LOWER EXTREMITY INTERVENTION Left 02/23/2024   Procedure: LOWER EXTREMITY INTERVENTION;  Surgeon: Jama Cordella MATSU, MD;  Location: [facility];  Service: Cardiovascular;  Laterality: Left;   METATARSAL HEAD EXCISION Right 03/21/2020   Procedure: METATARSAL HEAD RESECTION RIGHT AND DEBRIDEMENT OF ULCER;  Surgeon: Janit Thresa HERO, DPM;  [facility];  Service: Podiatry;  Laterality: Right;   METATARSAL HEAD EXCISION Left 09/15/2023   Procedure: METATARSAL HEAD EXCISION;  Surgeon: Janit Thresa HERO, DPM;  Location: [facility];  Service: Orthopedics/Podiatry;  Laterality: Left;   MULTIPLE TOOTH EXTRACTIONS     TEE WITHOUT CARDIOVERSION N/A 06/27/2016   Procedure: TRANSESOPHAGEAL ECHOCARDIOGRAM (TEE);  Surgeon: Maude Fleeta Ochoa, MD;  Location: [facility] OR;  Service: Open Heart Surgery;  Laterality: N/A;   TENDON LENGTHENING Left 12/04/2023   Procedure: TENDON-ACHILLES LENGTHENING;  Surgeon: Janit Thresa HERO, DPM;  Location: [facility];  Service: Orthopedics/Podiatry;  Laterality: Left;   TRANSMETATARSAL AMPUTATION Left 12/04/2023   Procedure: TRANSMETATARSAL AMPUTATION;  Surgeon: Janit Thresa HERO, DPM;  Location: [facility];  Service: Orthopedics/Podiatry;  Laterality: Left;    Allergies  Allergen Reactions   Latex Other (See Comments)    Blister and burning (79M insulin  pump latex band aid )   Nitroglycerin  Nausea Only and Other (See Comments)    Patches only - headache   Statins Other (See Comments)    BODY PAIN. Taking rosuvastatin  in the evenings and tolerating.    Fluvastatin Other (See Comments)    Muscle pain   Fluvastatin Sodium Other (See Comments)    Myalgias   Percocet [Oxycodone -Acetaminophen ] Itching   Rosuvastatin  Other (See Comments)    Myalgias. Taking in evening and tolerating   Simvastatin Other (See Comments)    Muscle pain, Myalgias   Lipitor [Atorvastatin] Other (See Comments) and Rash    BODY PAIN   Lisinopril  Nausea Only and Other (See Comments)    Other reaction(s):  Dizziness   Zetia [Ezetimibe] Other (See Comments)    Muscle Pain    LT foot 07/26/2024  RT third toe 07/26/2024  Objective/Physical Exam Graft is well adhered and intact to the amputation stump of the right third toe.  After removal of the graft with light debridement the wound appears very stable.  Intermixed fibrotic and granular tissue noted.  Today there is no exposed bone  Mostly unchanged.  Open incision noted to the plantar aspect of the left forefoot amputation stump approximately 1.5 cm in length.  No active bleeding.  There is some maceration to the area.  Appears stable and intact  MR FOOT LEFT WO CONTRAST 06/06/2024 IMPRESSION: 1. Marrow edema in the distal 3 cm of the first metatarsal remnant is worrisome for osteomyelitis. Minimal marrow edema in the distal 1.5-2 cm of the second and third metatarsal remnants is nonspecific and could be reactive or due to early osteomyelitis. 2. Fluid collection anterior and subjacent to the first metatarsal remnant is worrisome for abscess. 3. Diffuse subcutaneous edema about the imaged foot could be due to cellulitis or dependent change.  Aerobic/Anaerobic Culture w Gram Stain (surgical/deep wound) Order: [id number]  Status: Final result     Next appt: 07/26/2024 at 10:00 AM in Podiatry [facility] CHRISTELLA Sar, [place])   Test Result Released: Yes (not seen)   Specimen Information: Path Tissue  0 Result Notes    Component Ref Range & Units (hover) 7 d ago  Specimen Description TISSUE Performed at [facility], [address] [place], [place] [address]  Special Requests NONE Performed at [facility], [address] [place], [place] [address]  Gram Stain NO WBC SEEN NO ORGANISMS SEEN  Culture RARE STAPHYLOCOCCUS SAPROPHYTICUS NO ANAEROBES ISOLATED Performed at [facility], [address], [place], [place] [address]  Report Status 07/13/2024 FINAL  Organism ID, Bacteria STAPHYLOCOCCUS SAPROPHYTICUS  Resulting  Agency [facility]     Susceptibility   Staphylococcus saprophyticus    MIC    CIPROFLOXACIN  <=0.5 SENSI... Sensitive    CLINDAMYCIN >=8 RESISTANT Resistant    ERYTHROMYCIN >=8 RESISTANT Resistant    GENTAMICIN  <=0.5 SENSI... Sensitive    Inducible Clindamycin NEGATIVE Sensitive    OXACILLIN 2 RESISTANT Resistant    RIFAMPIN <=0.5 SENSI... Sensitive    TETRACYCLINE <=1 SENSITIVE Sensitive    TRIMETH /SULFA  <=10 SENSIT... Sensitive    VANCOMYCIN  <=0.5 SENSI... Sensitive           Susceptibility Comments  Staphylococcus saprophyticus  RARE STAPHYLOCOCCUS SAPROPHYTICUS     Aerobic/Anaerobic Culture w Gram Stain (surgical/deep wound) Order: [id number]  Status: Final result  Next appt: 07/26/2024 at 10:00 AM in Podiatry [facility] CHRISTELLA Sar, [place])   Test Result Released: Yes (not seen)   Specimen Information: Abscess  0 Result Notes  important suggestion  Newer results are available. Click to view them now.      Component Ref Range & Units (hover) 7 d ago  Specimen Description ABSCESS Performed at [facility], [address] [place], [place] [address]  Special Requests NONE Performed at [facility], [address] [place], [place] [address]  Gram Stain RARE WBC PRESENT, PREDOMINANTLY PMN NO ORGANISMS SEEN  Culture RARE ENTEROCOCCUS FAECALIS VANCOMYCIN  RESISTANT ENTEROCOCCUS ISOLATED NO ANAEROBES ISOLATED Performed at [facility], [address], [place], [place] [address]  Report Status 07/13/2024 FINAL  Organism ID, Bacteria ENTEROCOCCUS FAECALIS  Resulting Agency [facility]     Susceptibility   Enterococcus faecalis    MIC    AMPICILLIN <=2 SENSITIVE Sensitive    GENTAMICIN  SYNERGY RESISTANT Resistant    VANCOMYCIN  >=32 RESIST... Resistant           Susceptibility Comments  Enterococcus faecalis  RARE ENTEROCOCCUS FAECALIS     Specimen Collected: 07/08/24 15:34    Assessment: POV # 1 DOS 07/08/24 RT 3RD TO DEBRIDEMENT OF  ULCER W/ GRAFT APPLICATION, INCISION AND DRAINAGE LEFT FOOT, BONE BIOPSY 1ST METATARSAL LEFT.    Plan of Care:  -Patient was evaluated.  -Culture results reviewed -Referral placed for infectious disease consult.  The patient has had multiple amputation surgeries and for now we will pursue long-term antibiotic care.  Greatly appreciated -Patient has not been on antibiotics for about 2 weeks now.  Prescription for doxycycline  100 mg twice daily x 10 days.  Modify as per ID recs which may likely include long-term IV abx -Upcoming appointment with infectious disease on 08/02/2024.  Greatly appreciated -Continue WBAT surgical shoes bilateral -Return to clinic 4 weeks  Thresa EMERSON Sar, DPM [facility]  Dr. Thresa EMERSON Sar, DPM    [address] [place], [place] [address]                Office [phone]  Fax [phone]

## 2024-07-28 ENCOUNTER — Telehealth: Payer: Self-pay | Admitting: Podiatry

## 2024-07-28 ENCOUNTER — Other Ambulatory Visit: Payer: Self-pay

## 2024-07-28 MED ORDER — DOXYCYCLINE HYCLATE 100 MG PO TABS: 100.0000 mg | ORAL_TABLET | Freq: Two times a day (BID) | ORAL | 0 refills | Status: DC

## 2024-07-28 NOTE — Telephone Encounter (Signed)
 Patient called stating he was suppose to have an antibiotic sent to [facility] in [place] and its not there.

## 2024-07-28 NOTE — Telephone Encounter (Signed)
 Patient called stating he was suppose to have an antibiotic sent over to [facility] in [place] and its not there.

## 2024-08-02 ENCOUNTER — Ambulatory Visit: Attending: Infectious Diseases | Admitting: Infectious Diseases

## 2024-08-02 ENCOUNTER — Encounter: Payer: Self-pay | Admitting: Infectious Diseases

## 2024-08-02 VITALS — BP 137/73 | HR 61 | Temp 98.3°F

## 2024-08-02 DIAGNOSIS — Z951 Presence of aortocoronary bypass graft: Secondary | ICD-10-CM | POA: Diagnosis not present

## 2024-08-02 DIAGNOSIS — Z89421 Acquired absence of other right toe(s): Secondary | ICD-10-CM | POA: Diagnosis not present

## 2024-08-02 DIAGNOSIS — Z794 Long term (current) use of insulin: Secondary | ICD-10-CM | POA: Insufficient documentation

## 2024-08-02 DIAGNOSIS — Z1621 Resistance to vancomycin: Secondary | ICD-10-CM

## 2024-08-02 DIAGNOSIS — E1151 Type 2 diabetes mellitus with diabetic peripheral angiopathy without gangrene: Secondary | ICD-10-CM | POA: Diagnosis not present

## 2024-08-02 DIAGNOSIS — Z7902 Long term (current) use of antithrombotics/antiplatelets: Secondary | ICD-10-CM | POA: Diagnosis not present

## 2024-08-02 DIAGNOSIS — L089 Local infection of the skin and subcutaneous tissue, unspecified: Secondary | ICD-10-CM

## 2024-08-02 DIAGNOSIS — I739 Peripheral vascular disease, unspecified: Secondary | ICD-10-CM

## 2024-08-02 DIAGNOSIS — B952 Enterococcus as the cause of diseases classified elsewhere: Secondary | ICD-10-CM | POA: Diagnosis not present

## 2024-08-02 DIAGNOSIS — M86472 Chronic osteomyelitis with draining sinus, left ankle and foot: Secondary | ICD-10-CM | POA: Diagnosis present

## 2024-08-02 DIAGNOSIS — S91302D Unspecified open wound, left foot, subsequent encounter: Secondary | ICD-10-CM

## 2024-08-02 DIAGNOSIS — G43809 Other migraine, not intractable, without status migrainosus: Secondary | ICD-10-CM | POA: Insufficient documentation

## 2024-08-02 DIAGNOSIS — E11628 Type 2 diabetes mellitus with other skin complications: Secondary | ICD-10-CM

## 2024-08-02 DIAGNOSIS — Z79899 Other long term (current) drug therapy: Secondary | ICD-10-CM | POA: Insufficient documentation

## 2024-08-02 DIAGNOSIS — I2510 Atherosclerotic heart disease of native coronary artery without angina pectoris: Secondary | ICD-10-CM | POA: Insufficient documentation

## 2024-08-02 DIAGNOSIS — Z89432 Acquired absence of left foot: Secondary | ICD-10-CM | POA: Insufficient documentation

## 2024-08-02 DIAGNOSIS — Z96652 Presence of left artificial knee joint: Secondary | ICD-10-CM | POA: Diagnosis not present

## 2024-08-02 MED ORDER — AMOXICILLIN 500 MG PO CAPS: 1000.0000 mg | ORAL_CAPSULE | Freq: Three times a day (TID) | ORAL | 0 refills | Status: DC

## 2024-08-02 NOTE — Patient Instructions (Signed)
 You came in today for a follow-up on your recent foot surgery and to evaluate a suspected bone infection. We discussed the results of your bone biopsy and the cultures from your left foot, which showed an infection with a specific type of bacteria. We also reviewed your medical history, including your heart condition, diabetes, and previous surgeries.  YOUR PLAN:  -CHRONIC LEFT FOOT WOUND WITH DRAINAGE AFTER TRANSMETATARSAL AMPUTATION AND CHRONIC INACTIVE OSTEOMYELITIS: You have a chronic wound on your left foot that is draining fluid after your transmetatarsal amputation. 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 The bacteria causing the drainage is resistant to some antibiotics, so we are prescribing high-dose amoxicillin  (1 gram orally three times daily: two pills in the morning, two in the afternoon, and two in the evening). Please elevate your foot when sitting to reduce swelling and wear support socks at home.  INSTRUCTIONS:  Please schedule a follow-up appointment in two weeks to assess your response to the treatment and to monitor your kidney function due to the high-dose antibiotic use.

## 2024-08-02 NOTE — Progress Notes (Signed)
 NAME: Lawrence [name]  DOB: 01-Jun-1951  MRN: [id number]  Date/Time: 08/02/2024 11:57 AM   Subjective:  Pt is here with wife Agreed to the use of AI scribe   Lawrence [name] is a 73 year old male with coronary artery disease, s/p CABG, diabetes, and peripheral artery disease, Left TMA, rt multiple toes amputation . Vestibular migraine Left TKA, who presents with a suspected bone infection following recent foot surgery. He was referred by Dr. Janit for evaluation of a suspected bone infection.  He underwent a procedure on September 5th, where a left foot abscess was debrided and a graft was prepared for a right third toe ulcer. Cultures from the left foot revealed vancomycin -resistant Enterococcus faecalis sensitive to ampicillin. The bone biopsy was performed and pathology results were chronic inactive osteomyelitis   In 2018, he lost his left great toe due to infection, and in January of this year, he underwent a transmetatarsal amputation, losing all toes on the left foot. He currently has two and a half toes remaining on the right foot. He has not previously required a PICC line or intravenous antibiotics.  He is currently taking doxycycline , which he will run out of by October 6th. He has no known allergies to antibiotics and takes multiple medications including zonisamide for vestibular migraines, erenumab for migraines, and various medications for his heart and diabetes.  He has a history of coronary artery disease with a quad bypass in 2017, a heart attack in 2018, and a full left knee replacement in 2017. He also experiences chronic vestibular migraines, which have been partially controlled with medication.  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 - Vestibular migraine - Hypertension - Hyperlipidemia - Benign prostatic hyperplasia  Surgical History: - Coronary artery bypass grafting  (August 2017): Quadruple bypass - Left knee replacement (April 2017): Full replacement - Transmetatarsal amputation (December 03, 2021): Removal of all toes on the left foot - Great toe amputation (March or April 2018): Amputation of the great toe on the left foot - Debridement of left foot abscess and grafting of right third toe ulcer (July 08, 2022): Debridement and grafting procedure   Social History   Socioeconomic History   Marital status: Married    Spouse name: Lawrence,[name]   Number of children: Not on file   Years of education: Not on file   Highest education level: Not on file  Occupational History   Not on file  Tobacco Use   Smoking status: Former    Current packs/day: 0.00    Average packs/day: 1 pack/day for 16.7 years (16.7 ttl pk-yrs)    Types: Cigarettes    Start date: 11/03/1969    Quit date: 07/22/1986    Years since quitting: 38.0   Smokeless tobacco: Never  Vaping Use   Vaping status: Never Used  Substance and Sexual Activity   Alcohol use: Not Currently   Drug use: No   Sexual activity: Never  Other Topics Concern   Not on file  Social History Narrative   Married.  Lives at home with wife.    Social Drivers of Corporate investment banker Strain: Low Risk  (06/16/2024)   Received from [facility]   Overall Financial Resource Strain (CARDIA)    Difficulty of Paying Living Expenses: Not hard at all  Food Insecurity: No Food Insecurity (06/16/2024)   Received from [facility]  Hunger Vital Sign    Within the past 12 months, you worried that your food would run out before you got the money to buy more.: Never true    Within the past 12 months, the food you bought just didn't last and you didn't have money to get more.: Never true  Transportation Needs: No Transportation Needs (06/16/2024)   Received from [facility] - Transportation    In the past 12 months, has lack of transportation kept  you from medical appointments or from getting medications?: No    Lack of Transportation (Non-Medical): No  Physical Activity: Inactive (10/20/2023)   Exercise Vital Sign    Days of Exercise per Week: 0 days    Minutes of Exercise per Session: 0 min  Stress: No Stress Concern Present (10/20/2023)   [employer] of Occupational Health - Occupational Stress Questionnaire    Feeling of Stress : Only a little  Social Connections: Moderately Integrated (10/20/2023)   Social Connection and Isolation Panel    Frequency of Communication with Friends and Family: More than three times a week    Frequency of Social Gatherings with Friends and Family: Twice a week    Attends Religious Services: 1 to 4 times per year    Active Member of [employer] or Organizations: No    Attends Banker Meetings: Never    Marital Status: Married  Catering manager Violence: Not At Risk (10/20/2023)   Humiliation, Afraid, Rape, and Kick questionnaire    Fear of Current or Ex-Partner: No    Emotionally Abused: No    Physically Abused: No    Sexually Abused: No    Family History  Problem Relation Age of Onset   Hypertension Mother    Lupus Mother    Heart Problems Mother    Hypertension Father    Diabetes Father    Heart attack Father 72       died in his 72s   Drug abuse Brother    Heart disease Brother    Heart attack Maternal Grandmother    Arthritis Maternal Grandmother    Arthritis Maternal Grandfather    Heart attack Paternal Grandmother    Arthritis Paternal Grandmother    Arthritis Paternal Grandfather    Allergies  Allergen Reactions   Latex Other (See Comments)    Blister and burning (72M insulin  pump latex band aid )   Nitroglycerin  Nausea Only and Other (See Comments)    Patches only - headache   Statins Other (See Comments)    BODY PAIN. Taking rosuvastatin  in the evenings and tolerating.    Fluvastatin Other (See Comments)    Muscle pain   Fluvastatin Sodium Other (See  Comments)    Myalgias   Percocet [Oxycodone -Acetaminophen ] Itching   Rosuvastatin  Other (See Comments)    Myalgias. Taking in evening and tolerating   Simvastatin Other (See Comments)    Muscle pain, Myalgias   Lipitor [Atorvastatin] Other (See Comments) and Rash    BODY PAIN   Lisinopril  Nausea Only and Other (See Comments)    Other reaction(s): Dizziness   Zetia [Ezetimibe] Other (See Comments)    Muscle Pain  Meds I?- Doxycycline  - Zonisamide - Aimovig - Vitamin C  - Aspirin  - Calcium  - Lactulose  - Carvedilol - Vitamin D3 - Clopidogrel  -- Furosemide  - Famotidine  - Insulin  - Isosorbide  - Loratadine  - Nitroglycerin  as needed - Oxycodone  - Rosuvastatin  - Flonase  Current Outpatient Medications  Medication Sig Dispense Refill  acetaminophen  (TYLENOL ) 500 MG tablet Take 1,000 mg by mouth every 6 (six) hours as needed for moderate pain (pain score 4-6).     AIMOVIG 140 MG/ML SOAJ Inject 140 mg into the skin every 30 (thirty) days.     albuterol  (VENTOLIN  HFA) 108 (90 Base) MCG/ACT inhaler Inhale 2 puffs into the lungs every 4 (four) hours as needed for wheezing. 1 each 0   amoxicillin  (AMOXIL ) 500 MG tablet Take 2,000 mg by mouth See admin instructions. Take 4 capsules (2000 mg) by mouth 1 hour prior to dental appointments     aspirin  81 MG EC tablet Take 81 mg by mouth daily. Swallow whole.     Atogepant (QULIPTA) 60 MG TABS Take 60 mg by mouth 1 day or 1 dose.     azelastine (ASTELIN) 0.1 % nasal spray Place 2 sprays into both nostrils as needed for rhinitis. Use in each nostril as directed     Calcium  Carb-Cholecalciferol  (CALCIUM  + D3 PO) Take 1 tablet by mouth in the morning and at bedtime.     carvedilol (COREG) 12.5 MG tablet Take 12.5 mg by mouth 2 (two) times daily with a meal.     Cholecalciferol  (VITAMIN D ) 50 MCG (2000 UT) tablet Take 2,000 Units by mouth in the morning and at bedtime.     clopidogrel  (PLAVIX ) 75 MG tablet Take 1 tablet (75 mg total) by mouth  daily. 30 tablet 0   doxycycline  (VIBRA -TABS) 100 MG tablet Take 1 tablet (100 mg total) by mouth 2 (two) times daily. 20 tablet 0   famotidine  (ACID CONTROLLER) 10 MG tablet Take 10 mg by mouth in the morning.     furosemide  (LASIX ) 40 MG tablet Take 1 tablet (40 mg total) by mouth daily. 30 tablet 1   glucagon 1 MG injection Inject 1 mg into the skin once as needed.     GLUTOSE 15 40 % GEL Take 1 Tube by mouth once as needed for low blood sugar.     insulin  aspart (NOVOLOG ) 100 UNIT/ML injection Inject 20 Units into the skin 3 (three) times daily. (Patient taking differently: Inject 10-20 Units into the skin 3 (three) times daily with meals. Sliding Scale will go up to 25 units for BG > 300 - 400) 10 mL 5   insulin  glargine-yfgn (SEMGLEE ) 100 UNIT/ML injection Inject 20 Units into the skin at bedtime.     isosorbide  mononitrate (IMDUR ) 60 MG 24 hr tablet Take 60 mg by mouth in the morning and at bedtime.     loratadine  (CLARITIN ) 10 MG tablet Take 10 mg by mouth in the morning.     nitroGLYCERIN  (NITROSTAT ) 0.4 MG SL tablet DISSOLVE 1 TABLET UNDER THE TONGUE EVERY 5 MINUTES AS NEEDED FOR CHEST PAIN 30 tablet 0   potassium chloride  SA (K-DUR,KLOR-CON ) 20 MEQ tablet Take 1 tablet (20 mEq total) by mouth daily. 30 tablet 1   rosuvastatin  (CRESTOR ) 20 MG tablet Take 1 tablet (20 mg total) by mouth daily at 6 PM. 30 tablet 1   Travoprost, BAK Free, (TRAVATAN) 0.004 % SOLN ophthalmic solution Place 1 drop into both eyes at bedtime.      vitamin C  (ASCORBIC ACID ) 500 MG tablet Take 500 mg by mouth 2 (two) times daily.      zonisamide (ZONEGRAN) 100 MG capsule Take 3 capsules (300 mg total) by mouth once daily .     No current facility-administered medications for this visit.     Abtx:  Anti-infectives (From admission,  onward)    None       REVIEW OF SYSTEMS:  Const: negative fever, negative chills, negative weight loss Eyes: negative diplopia or visual changes, negative eye pain ENT:  negative coryza, negative sore throat Resp: negative cough, hemoptysis, dyspnea Cards: negative for chest pain, palpitations, lower extremity edema GU: negative for frequency, dysuria and hematuria GI: Negative for abdominal pain, diarrhea, bleeding, constipation Skin: negative for rash and pruritus Heme: negative for easy bruising and gum/nose bleeding FD:[id number] Discharge left foot wound Neurolo: headaches, Psych: negative for feelings of anxiety, depression  Endocrine: negative for thyroid , diabetes Allergy/Immunology- negative for any medication or food allergies ? Pertinent Positives include : Objective:  VITALS:  BP 137/73   Pulse 61   Temp 98.3 F (36.8 C) (Temporal)   SpO2 98%   PHYSICAL EXAM:  General: Alert, cooperative, no distress, appears stated age.  Head: Normocephalic, without obvious abnormality, atraumatic. Eyes: Conjunctivae clear, anicteric sclerae. Pupils are equal ENT Nares normal. No drainage or sinus tenderness. Lips, mucosa, and tongue normal. No Thrush Neck: Supple, symmetrical, no adenopathy, thyroid : non tender no carotid bruit and no JVD. Back: No CVA tenderness. Lungs: Clear to auscultation bilaterally. No Wheezing or Rhonchi. No rales. Heart: Regular rate and rhythm, no murmur, rub or gallop. Abdomen: Soft, non-tender,not distended. Bowel sounds normal. No masses Extremities:        Skin: No rashes or lesions. Or bruising Lymph: Cervical, supraclavicular normal. Neurologic: Grossly non-focal Pertinent Labs Lab Results CBC    Component Value Date/Time   WBC 7.4 07/01/2024 1100   RBC 3.85 (L) 07/01/2024 1100   HGB 12.4 (L) 07/01/2024 1100   HGB 13.5 02/25/2022 1156   HCT 36.7 (L) 07/01/2024 1100   HCT 40.0 02/25/2022 1156   PLT 187 07/01/2024 1100   PLT 234 02/25/2022 1156   MCV 95.3 07/01/2024 1100   MCV 96 02/25/2022 1156   MCV 95 10/12/2013 0515   MCH 32.2 07/01/2024 1100   MCHC 33.8 07/01/2024 1100   RDW 12.3 07/01/2024  1100   RDW 12.4 02/25/2022 1156   RDW 13.1 10/12/2013 0515   LYMPHSABS 1.6 04/27/2023 2051   LYMPHSABS 1.9 02/25/2022 1156   LYMPHSABS 2.3 10/12/2013 0515   MONOABS 0.5 04/27/2023 2051   MONOABS 0.7 10/12/2013 0515   EOSABS 0.1 04/27/2023 2051   EOSABS 0.1 02/25/2022 1156   EOSABS 0.2 10/12/2013 0515   BASOSABS 0.0 04/27/2023 2051   BASOSABS 0.1 02/25/2022 1156   BASOSABS 0.0 10/12/2013 0515       Latest Ref Rng & Units 06/09/2024   12:00 AM 05/03/2024   10:29 AM 02/23/2024   11:16 AM  CMP  BUN 4 - 21 20     32  28   Creatinine 0.6 - 1.3 1.3     1.09  1.09   Sodium 137 - 147 141        Potassium 3.5 - 5.1 mEq/L 4.5        Chloride 99 - 108 101        CO2 13 - 22 32        Calcium  8.7 - 10.7 9.7        AST 14 - 40 14        ALT 10 - 40 U/L 10           This result is from an external source.      Microbiology: 07/08/24 Left foot enterococcus fecalis vanco resistant, ampicillin sensitiitve  Staph saphrolyticus  IMAGING RESULTS: No recent imaging   ? Impression/Recommendation Diabetes mellitus with peripheral neuropathy with  ?Chronic left foot wound with drainage after transmetatarsal amputation and chronic inactive osteomyelitis   The chronic left foot wound exhibits  some drainage . Cultures reveal vancomycin -resistant Enterococcus faecalis, and a bone biopsy indicates chronic inactive osteomyelitis, suggesting a burnt-out infection. Doxycycline  is ineffective against the bacteria causing the drainage. No PICC line or intravenous antibiotics are used, and there are no known antibiotic allergies. High-dose oral amoxicllin   preferred over intravenous treatment. Prescribe 1 gram orally three times daily  for 2 weeks and evaluate Doxy can be stopped as he has been on it since 9/5   follow-up appointment in two weeks to assess response to treatment and monitor kidney function due to high-dose antibiotic use. Advise elevating the foot when sitting to reduce swelling and  instruct to wear support socks at home.  Cad s/op CABG on plavix , rosuvastatin   Vestibular migraine on zonisamide and aimovig ? ______________________________________ Discussed with patient and wife Follow up 2 weeks Labs in 2 weeks

## 2024-08-05 ENCOUNTER — Ambulatory Visit: Admitting: Podiatry

## 2024-08-16 ENCOUNTER — Encounter: Payer: Self-pay | Admitting: Podiatry

## 2024-08-16 ENCOUNTER — Ambulatory Visit (INDEPENDENT_AMBULATORY_CARE_PROVIDER_SITE_OTHER): Admitting: Podiatry

## 2024-08-16 VITALS — Ht 70.0 in | Wt 291.1 lb

## 2024-08-16 DIAGNOSIS — M86472 Chronic osteomyelitis with draining sinus, left ankle and foot: Secondary | ICD-10-CM | POA: Diagnosis not present

## 2024-08-16 NOTE — Progress Notes (Signed)
 Chief Complaint  Patient presents with   Routine Post Op    POV #3 DOS 07/08/24 RT 3RD TO DEBRIDEMENT OF ULCER W/ GRAFT APPLICATION, INCISION AND DRAINAGE LEFT FOOT, BONE BIOPSY 1ST METATARSAL LEFT. He feels that it is healing very well. No pain noted    Subjective:  Patient presents today status post B/L foot surgery.  DOS: 07/08/2024.  Past Medical History:  Diagnosis Date   Allergies    Anginal pain    Aortic atherosclerosis    Bilateral carotid artery disease    Breast pain, left 08/29/2022   CAD (coronary artery disease) 07/2007   a.) s/p NSTEMI 07/2007 - sig multi-vessel CAD not amenable to PCI; b.) LHC 06/11/2016: multi vessel CAD not amenable to PCI --> refer to CVTS; c.) s/p 4v CABG 06/27/2016; d.) LHC 06/09/2023: 75% mLM, 80% o-pLAD, 60% mLAD, 100% p-mLCx, 100% OM1, 95% pRCA, 100% mRCA, 50% m-dLAD. LIMA-LAD and SVG-OM1 patent. CTO SVG-RCA with L-R collaterals - med mgmt   Carpal tunnel syndrome on left    a.) s/p release 04/2016   Cerebral microvascular disease    CKD (chronic kidney disease), stage III (HCC)    COVID-19 12/09/2022   Diabetic peripheral neuropathy (HCC)    Diastolic dysfunction    a.) TTE 06/25/2016: EF 60-654%. no RWMAs, G1DD, mild AoV annular calc; b.) TTE 03/19/2017: EF 50-55%, no RWMAs, mild MR; c.) TTE 07/22/2019: EF 55-60%, midl LVH, no RWMAs, triv MR, mild TR   Dizziness 04/30/2021   Dyspnea    GERD (gastroesophageal reflux disease)    Glaucoma    Headache    History of bilateral cataract extraction    HLD (hyperlipidemia)    HTN (hypertension)    Long term current use of clopidogrel     Long-term use of aspirin  therapy    MRSA (methicillin resistant staph aureus) culture positive    2006 - 2008   NSTEMI (non-ST elevated myocardial infarction) (HCC) 07/27/2007   a.) LHC 07/30/2007: 30% mLAD, 99% D1, 50% pLCx, 40% mLCx, 70/75/85% dLCx, 50% OM1, 75/80/80 OM2, 50% pRCA, 60% mRCA, 85% dRCA --> not amenable to PCI   OSA on CPAP    Osteoarthritis     Osteomyelitis of left foot (HCC)    PAD (peripheral artery disease)    a. 03/2022 Lower Ext Angio: No signif Ao-iliac dzs. R PT diff dzs and occluded above ankle. R Pedal arch intact w/ excellent antegrade flow provided by R AT-->no indication for revasc.   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   a.) LIMA-LAD, SVG-RCA, seq SVG-OM1-OM2   S/P tube myringotomy    Skin cancer, basal cell    Skin ulcer of right great toe, unspecified ulcer stage (HCC)    T1DM (type 1 diabetes mellitus) (HCC)    Wears glasses    Wears partial dentures    top    Past Surgical History:  Procedure Laterality Date   ABDOMINAL AORTOGRAM W/LOWER EXTREMITY N/A 03/26/2022   Procedure: ABDOMINAL AORTOGRAM W/LOWER EXTREMITY;  Surgeon: Darron Deatrice LABOR, MD;  [facility];  Service: Cardiovascular;  Laterality: N/A;   AMPUTATION TOE Bilateral 06/26/2023   Procedure: AMPUTATION TOE MPJ OF THE JOINT,PARTIAL TOE AMPUTUATION;  Surgeon: Janit Thresa HERO, DPM;  Location: [facility];  Service: Orthopedics/Podiatry;  Laterality: Bilateral;   AMPUTATION TOE Right 12/04/2023   Procedure: AMPUTATION TOE INTERPHALANGEAL 3RD;  Surgeon: Janit Thresa HERO, DPM;  Location: [facility];  Service: Orthopedics/Podiatry;  Laterality: Right;   APPENDECTOMY     CARDIAC CATHETERIZATION N/A 06/11/2016   Procedure: Left Heart Cath and Coronary Angiography;  Surgeon: Wolm JINNY Rhyme, MD;  Location: [facility];  Service: Cardiovascular;  Laterality: N/A;   CARPAL TUNNEL RELEASE Left 04/24/2016   Procedure: CARPAL TUNNEL RELEASE;  Surgeon: Ozell Flake, MD;  Location: [facility];  Service: Orthopedics;  Laterality: Left;   CATARACT EXTRACTION W/ INTRAOCULAR LENS IMPLANT Right 07/16/2012   CATARACT EXTRACTION W/ INTRAOCULAR LENS IMPLANT Left 08/13/2012   CORONARY ARTERY BYPASS GRAFT N/A 06/27/2016   Procedure: CORONARY ARTERY BYPASS GRAFTING times four using left internal mammary  artery and right leg saphenous vein;  Surgeon: Maude Fleeta Ochoa, MD;  Location: [facility];  Service: Open Heart Surgery;  Laterality: N/A;   FOREARM FRACTURE SURGERY Left 1961   IRRIGATION AND DEBRIDEMENT ABSCESS Right 07/08/2024   Procedure: IRRIGATION AND DEBRIDEMENT ULCER, WITH GRAFT APPLICATION RIGHT THIRD TOE;  Surgeon: Janit Thresa HERO, DPM;  Location: [facility];  Service: Orthopedics/Podiatry;  Laterality: Right;   IRRIGATION AND DEBRIDEMENT FOOT Left 07/08/2024   Procedure: IRRIGATION AND DEBRIDEMENT FOOT AMPUTATION STUMB, BONE BIOPSY FIRST METATARSAL;  Surgeon: Janit Thresa HERO, DPM;  Location: [facility];  Service: Orthopedics/Podiatry;  Laterality: Left;   KNEE ARTHROPLASTY Left 02/11/2016   Procedure: COMPUTER ASSISTED TOTAL KNEE ARTHROPLASTY;  Surgeon: Lynwood SHAUNNA Hue, MD;  Location: [facility];  Service: Orthopedics;  Laterality: Left;   KNEE ARTHROSCOPY Right 2000   LEFT HEART CATH AND CORONARY ANGIOGRAPHY Left 07/30/2007   Procedure: LEFT HEART CATH AND CORONARY ANGIOGRAPHY; Location: [facility]; Surgeon: Wolm Rhyme, MD   LEFT HEART CATH AND CORS/GRAFTS ANGIOGRAPHY Left 01/13/2017   Procedure: Left Heart Cath and Cors/Grafts Angiography;  Surgeon: Wolm JINNY Rhyme, MD;  Location: [facility];  Service: Cardiovascular;  Laterality: Left;   LEFT HEART CATH AND CORS/GRAFTS ANGIOGRAPHY Left 06/09/2023   Procedure: LEFT HEART CATH AND CORS/GRAFTS ANGIOGRAPHY;  Surgeon: Ammon Blunt, MD;  Location: [facility];  Service: Cardiovascular;  Laterality: Left;   LOWER EXTREMITY ANGIOGRAPHY Left 02/23/2024   Procedure: Lower Extremity Angiography;  Surgeon: Jama Cordella MATSU, MD;  Location: [facility];  Service: Cardiovascular;  Laterality: Left;   LOWER EXTREMITY ANGIOGRAPHY Right 05/03/2024   Procedure: Lower Extremity Angiography;  Surgeon: Jama Cordella MATSU, MD;  Location: [facility];  Service: Cardiovascular;  Laterality: Right;   LOWER EXTREMITY INTERVENTION  Left 02/23/2024   Procedure: LOWER EXTREMITY INTERVENTION;  Surgeon: Jama Cordella MATSU, MD;  Location: [facility];  Service: Cardiovascular;  Laterality: Left;   METATARSAL HEAD EXCISION Right 03/21/2020   Procedure: METATARSAL HEAD RESECTION RIGHT AND DEBRIDEMENT OF ULCER;  Surgeon: Janit Thresa HERO, DPM;  Location: [facility];  Service: Podiatry;  Laterality: Right;   METATARSAL HEAD EXCISION Left 09/15/2023   Procedure: METATARSAL HEAD EXCISION;  Surgeon: Janit Thresa HERO, DPM;  Location: [facility];  Service: Orthopedics/Podiatry;  Laterality: Left;   MULTIPLE TOOTH EXTRACTIONS     TEE WITHOUT CARDIOVERSION N/A 06/27/2016   Procedure: TRANSESOPHAGEAL ECHOCARDIOGRAM (TEE);  Surgeon: Maude Fleeta Ochoa, MD;  [facility];  Service: Open Heart Surgery;  Laterality: N/A;   TENDON LENGTHENING Left 12/04/2023   Procedure: TENDON-ACHILLES LENGTHENING;  Surgeon: Janit Thresa HERO, DPM;  Location: [facility];  Service: Orthopedics/Podiatry;  Laterality: Left;   TRANSMETATARSAL AMPUTATION Left 12/04/2023   Procedure: TRANSMETATARSAL AMPUTATION;  Surgeon: Janit Thresa HERO, DPM;  Location: [facility];  Service: Orthopedics/Podiatry;  Laterality:  Left;    Allergies  Allergen Reactions   Latex Other (See Comments)    Blister and burning (13M insulin  pump latex band aid )   Nitroglycerin  Nausea Only and Other (See Comments)    Patches only - headache   Statins Other (See Comments)    BODY PAIN. Taking rosuvastatin  in the evenings and tolerating.    Fluvastatin Other (See Comments)    Muscle pain   Fluvastatin Sodium Other (See Comments)    Myalgias   Percocet [Oxycodone -Acetaminophen ] Itching   Rosuvastatin  Other (See Comments)    Myalgias. Taking in evening and tolerating   Simvastatin Other (See Comments)    Muscle pain, Myalgias   Lipitor [Atorvastatin] Other (See Comments) and Rash    BODY PAIN   Lisinopril  Nausea Only and Other (See Comments)    Other reaction(s): Dizziness   Zetia [Ezetimibe]  Other (See Comments)    Muscle Pain    LT foot 07/26/2024  RT third toe 07/26/2024  Objective/Physical Exam Ulcer noted to the distal tip of the right third toe stable.  Intermixed fibrotic and granular tissue noted.  Today there is no exposed bone  Stable.  Ulcer noted to the plantar aspect of the left forefoot amputation stump approximately 1.5 cm in length.  No active bleeding.  There is some maceration to the area.  Appears stable and intact  MR FOOT LEFT WO CONTRAST 06/06/2024 IMPRESSION: 1. Marrow edema in the distal 3 cm of the first metatarsal remnant is worrisome for osteomyelitis. Minimal marrow edema in the distal 1.5-2 cm of the second and third metatarsal remnants is nonspecific and could be reactive or due to early osteomyelitis. 2. Fluid collection anterior and subjacent to the first metatarsal remnant is worrisome for abscess. 3. Diffuse subcutaneous edema about the imaged foot could be due to cellulitis or dependent change.  Aerobic/Anaerobic Culture w Gram Stain (surgical/deep wound) Order: [id number]  Status: Final result     Next appt: 07/26/2024 at 10:00 AM in Podiatry [facility] CHRISTELLA Sar, [place])   Test Result Released: Yes (not seen)   Specimen Information: Path Tissue  0 Result Notes    Component Ref Range & Units (hover) 7 d ago  Specimen Description TISSUE Performed at [facility] [address] [place], [place] [address]  Special Requests NONE Performed at [facility], [address] [place], [place] [address]  Gram Stain NO WBC SEEN NO ORGANISMS SEEN  Culture RARE STAPHYLOCOCCUS SAPROPHYTICUS NO ANAEROBES ISOLATED Performed at [facility], [address] [place], [place] [address]  Report Status 07/13/2024 FINAL  Organism ID, Bacteria STAPHYLOCOCCUS SAPROPHYTICUS  Resulting Agency [facility]     Susceptibility   Staphylococcus saprophyticus    MIC    CIPROFLOXACIN  <=0.5 SENSI... Sensitive    CLINDAMYCIN >=8  RESISTANT Resistant    ERYTHROMYCIN >=8 RESISTANT Resistant    GENTAMICIN  <=0.5 SENSI... Sensitive    Inducible Clindamycin NEGATIVE Sensitive    OXACILLIN 2 RESISTANT Resistant    RIFAMPIN <=0.5 SENSI... Sensitive    TETRACYCLINE <=1 SENSITIVE Sensitive    TRIMETH /SULFA  <=10 SENSIT... Sensitive    VANCOMYCIN  <=0.5 SENSI... Sensitive           Susceptibility Comments  Staphylococcus saprophyticus  RARE STAPHYLOCOCCUS SAPROPHYTICUS     Aerobic/Anaerobic Culture w Gram Stain (surgical/deep wound) Order: [id number]  Status: Final result     Next appt: 07/26/2024 at 10:00 AM in Podiatry [facility] CHRISTELLA Sar, DPM)   Test Result Released: Yes (not seen)  Specimen Information: Abscess  0 Result Notes  important suggestion  Newer results are available. Click to view them now.      Component Ref Range & Units (hover) 7 d ago  Specimen Description ABSCESS Performed at [facility], [address] [place], [place] [address]  Special Requests NONE Performed at [facility], [address] [place], [place] [address]  Gram Stain RARE WBC PRESENT, PREDOMINANTLY PMN NO ORGANISMS SEEN  Culture RARE ENTEROCOCCUS FAECALIS VANCOMYCIN  RESISTANT ENTEROCOCCUS ISOLATED NO ANAEROBES ISOLATED Performed at [facility], [address] [place], [place] [address]  Report Status 07/13/2024 FINAL  Organism ID, Bacteria ENTEROCOCCUS FAECALIS  Resulting Agency [facility]     Susceptibility   Enterococcus faecalis    MIC    AMPICILLIN <=2 SENSITIVE Sensitive    GENTAMICIN  SYNERGY RESISTANT Resistant    VANCOMYCIN  >=32 RESIST... Resistant           Susceptibility Comments  Enterococcus faecalis  RARE ENTEROCOCCUS FAECALIS     Specimen Collected: 07/08/24 15:34    Assessment: POV # 1 DOS 07/08/24 RT 3RD TO DEBRIDEMENT OF ULCER W/ GRAFT APPLICATION, INCISION AND DRAINAGE LEFT FOOT, BONE BIOPSY 1ST METATARSAL LEFT.    Plan of Care:  -Patient was evaluated.   -Culture results reviewed -Referral placed for infectious disease consult.  The patient has had multiple amputation surgeries and for now we will pursue long-term antibiotic care.  Greatly appreciated -Patient has not been on antibiotics for about 2 weeks now.  Prescription for doxycycline  100 mg twice daily x 10 days.  Modify as per ID recs which may likely include long-term IV abx - Continue treatment with ID.  Last seen 08/02/2024.  High-dose oral amoxicllin   preferred over intravenous treatment. Prescribe 1 gram orally three times daily  for 2 weeks and evaluate Doxy can be stopped as he has been on it since 9/5 -Continue WBAT surgical shoes bilateral -Return to clinic 4 weeks  Thresa EMERSON Sar, DPM [facility]  Dr. Thresa EMERSON Sar, DPM    [address] [place], [place] [address]                Office [phone]  Fax [phone]

## 2024-08-18 ENCOUNTER — Other Ambulatory Visit
Admission: RE | Admit: 2024-08-18 | Discharge: 2024-08-18 | Disposition: A | Discharge status: Home / Self Care | Source: Ambulatory Visit | Attending: Infectious Diseases | Admitting: Infectious Diseases

## 2024-08-18 ENCOUNTER — Encounter: Payer: Self-pay | Admitting: Infectious Diseases

## 2024-08-18 ENCOUNTER — Ambulatory Visit: Attending: Infectious Diseases | Admitting: Infectious Diseases

## 2024-08-18 ENCOUNTER — Encounter (INDEPENDENT_AMBULATORY_CARE_PROVIDER_SITE_OTHER): Payer: Self-pay | Admitting: Vascular Surgery

## 2024-08-18 ENCOUNTER — Ambulatory Visit (INDEPENDENT_AMBULATORY_CARE_PROVIDER_SITE_OTHER): Admitting: Vascular Surgery

## 2024-08-18 ENCOUNTER — Encounter (INDEPENDENT_AMBULATORY_CARE_PROVIDER_SITE_OTHER)

## 2024-08-18 ENCOUNTER — Other Ambulatory Visit (INDEPENDENT_AMBULATORY_CARE_PROVIDER_SITE_OTHER)

## 2024-08-18 ENCOUNTER — Ambulatory Visit (INDEPENDENT_AMBULATORY_CARE_PROVIDER_SITE_OTHER)

## 2024-08-18 VITALS — BP 114/65 | HR 65 | Temp 98.0°F

## 2024-08-18 VITALS — BP 130/65 | HR 62 | Resp 18 | Ht 70.0 in | Wt 295.0 lb

## 2024-08-18 DIAGNOSIS — Z1621 Resistance to vancomycin: Secondary | ICD-10-CM | POA: Diagnosis present

## 2024-08-18 DIAGNOSIS — E1151 Type 2 diabetes mellitus with diabetic peripheral angiopathy without gangrene: Secondary | ICD-10-CM | POA: Diagnosis not present

## 2024-08-18 DIAGNOSIS — I7025 Atherosclerosis of native arteries of other extremities with ulceration: Secondary | ICD-10-CM | POA: Diagnosis not present

## 2024-08-18 DIAGNOSIS — Z951 Presence of aortocoronary bypass graft: Secondary | ICD-10-CM | POA: Diagnosis not present

## 2024-08-18 DIAGNOSIS — I739 Peripheral vascular disease, unspecified: Secondary | ICD-10-CM | POA: Insufficient documentation

## 2024-08-18 DIAGNOSIS — Z89422 Acquired absence of other left toe(s): Secondary | ICD-10-CM | POA: Diagnosis not present

## 2024-08-18 DIAGNOSIS — Z9889 Other specified postprocedural states: Secondary | ICD-10-CM

## 2024-08-18 DIAGNOSIS — Z96652 Presence of left artificial knee joint: Secondary | ICD-10-CM | POA: Diagnosis not present

## 2024-08-18 DIAGNOSIS — E785 Hyperlipidemia, unspecified: Secondary | ICD-10-CM | POA: Diagnosis not present

## 2024-08-18 DIAGNOSIS — S91302D Unspecified open wound, left foot, subsequent encounter: Secondary | ICD-10-CM

## 2024-08-18 DIAGNOSIS — I10 Essential (primary) hypertension: Secondary | ICD-10-CM

## 2024-08-18 DIAGNOSIS — B952 Enterococcus as the cause of diseases classified elsewhere: Secondary | ICD-10-CM

## 2024-08-18 DIAGNOSIS — L089 Local infection of the skin and subcutaneous tissue, unspecified: Secondary | ICD-10-CM | POA: Insufficient documentation

## 2024-08-18 DIAGNOSIS — E1142 Type 2 diabetes mellitus with diabetic polyneuropathy: Secondary | ICD-10-CM | POA: Diagnosis not present

## 2024-08-18 DIAGNOSIS — I2510 Atherosclerotic heart disease of native coronary artery without angina pectoris: Secondary | ICD-10-CM | POA: Insufficient documentation

## 2024-08-18 DIAGNOSIS — E11621 Type 2 diabetes mellitus with foot ulcer: Secondary | ICD-10-CM | POA: Insufficient documentation

## 2024-08-18 DIAGNOSIS — G43809 Other migraine, not intractable, without status migrainosus: Secondary | ICD-10-CM | POA: Insufficient documentation

## 2024-08-18 DIAGNOSIS — Z87891 Personal history of nicotine dependence: Secondary | ICD-10-CM | POA: Diagnosis not present

## 2024-08-18 DIAGNOSIS — Z89412 Acquired absence of left great toe: Secondary | ICD-10-CM | POA: Diagnosis not present

## 2024-08-18 DIAGNOSIS — B957 Other staphylococcus as the cause of diseases classified elsewhere: Secondary | ICD-10-CM

## 2024-08-18 LAB — COMPREHENSIVE METABOLIC PANEL WITH GFR
ALT: 12 U/L (ref 0–44)
AST: 16 U/L (ref 15–41)
Albumin: 4 g/dL (ref 3.5–5.0)
Alkaline Phosphatase: 85 U/L (ref 38–126)
Anion gap: 12 (ref 5–15)
BUN: 28 mg/dL — ABNORMAL HIGH (ref 8–23)
CO2: 28 mmol/L (ref 22–32)
Calcium: 9.7 mg/dL (ref 8.9–10.3)
Chloride: 101 mmol/L (ref 98–111)
Creatinine, Ser: 1.37 mg/dL — ABNORMAL HIGH (ref 0.61–1.24)
GFR, Estimated: 54 mL/min — ABNORMAL LOW (ref >60)
Glucose, Bld: 110 mg/dL — ABNORMAL HIGH (ref 70–99)
Potassium: 3.9 mmol/L (ref 3.5–5.1)
Sodium: 141 mmol/L (ref 135–145)
Total Bilirubin: 0.9 mg/dL (ref 0.0–1.2)
Total Protein: 7.4 g/dL (ref 6.5–8.1)

## 2024-08-18 LAB — CBC WITH DIFFERENTIAL/PLATELET
Abs Immature Granulocytes: 0.03 K/uL (ref 0.00–0.07)
Basophils Absolute: 0.1 K/uL (ref 0.0–0.1)
Basophils Relative: 1 %
Eosinophils Absolute: 0.2 K/uL (ref 0.0–0.5)
Eosinophils Relative: 3 %
HCT: 35.8 % — ABNORMAL LOW (ref 39.0–52.0)
Hemoglobin: 12.1 g/dL — ABNORMAL LOW (ref 13.0–17.0)
Immature Granulocytes: 0 %
Lymphocytes Relative: 28 %
Lymphs Abs: 2.1 K/uL (ref 0.7–4.0)
MCH: 31.8 pg (ref 26.0–34.0)
MCHC: 33.8 g/dL (ref 30.0–36.0)
MCV: 94 fL (ref 80.0–100.0)
Monocytes Absolute: 0.6 K/uL (ref 0.1–1.0)
Monocytes Relative: 9 %
Neutro Abs: 4.4 K/uL (ref 1.7–7.7)
Neutrophils Relative %: 59 %
Platelets: 191 K/uL (ref 150–400)
RBC: 3.81 MIL/uL — ABNORMAL LOW (ref 4.22–5.81)
RDW: 12.5 % (ref 11.5–15.5)
WBC: 7.4 K/uL (ref 4.0–10.5)
nRBC: 0 % (ref 0.0–0.2)

## 2024-08-18 LAB — C-REACTIVE PROTEIN: CRP: <0.5 mg/dL (ref <1.0)

## 2024-08-18 LAB — SEDIMENTATION RATE: Sed Rate: 39 mm/h — ABNORMAL HIGH (ref 0–20)

## 2024-08-18 MED ORDER — AMOXICILLIN 500 MG PO CAPS: 500.0000 mg | ORAL_CAPSULE | Freq: Three times a day (TID) | ORAL | 0 refills | Status: DC

## 2024-08-18 MED ORDER — AMOXICILLIN 500 MG PO CAPS: 1000.0000 mg | ORAL_CAPSULE | Freq: Three times a day (TID) | ORAL | 1 refills | Status: AC

## 2024-08-18 NOTE — Progress Notes (Signed)
 NAME: [name]  DOB: 09/03/1951  MRN: [id number]  Date/Time: 08/18/2024 11:42 AM   Subjective:  Follow up visit for foot infection. 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 Vestibular migraine Left TKA, chronic wounds both feet He underwent a procedure on September 5th, where  left foot abscess was debrided and a graft was prepared for a right third toe ulcer. Cultures from the left foot revealed vancomycin -resistant Enterococcus faecalis sensitive to ampicillin. The bone biopsy was performed and pathology results were chronic inactive osteomyelitis   He was placed on Doxy which I discontinued   In 2018, he lost his left great toe due to infection, and in January of this year, he underwent a transmetatarsal amputation, losing all toes on the left foot. He currently has two and a half toes remaining on the right foot. He has not previously required a PICC line or intravenous antibiotics.  He has no known allergies to antibiotics and takes multiple medications including zonisamide for vestibular migraines, erenumab for migraines, and various medications for his heart and diabetes.  He has a history of coronary artery disease with a quad bypass in 2017, a heart attack in 2018, and a full left knee replacement in 2017. He also experiences chronic vestibular migraines, which have been partially controlled with medication.  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 - Vestibular migraine - Hypertension - Hyperlipidemia - Benign prostatic hyperplasia  Surgical History: - Coronary artery bypass grafting (August 2017): Quadruple  bypass - Left knee replacement (April 2017): Full replacement - Transmetatarsal amputation (December 03, 2021): Removal of all toes on the left foot - Great toe amputation (March or April 2018): Amputation of the great toe on the left foot - Debridement of left foot abscess and grafting of right third toe ulcer (July 08, 2022): Debridement and grafting procedure   Social History   Socioeconomic History   Marital status: Married    Spouse name: [name],Lawrence   Number of children: Not on file   Years of education: Not on file   Highest education level: Not on file  Occupational History   Not on file  Tobacco Use   Smoking status: Former    Current packs/day: 0.00    Average packs/day: 1 pack/day for 16.7 years (16.7 ttl pk-yrs)    Types: Cigarettes    Start date: 11/03/1969    Quit date: 07/22/1986    Years since quitting: 38.1   Smokeless tobacco: Never  Vaping Use   Vaping status: Never Used  Substance and Sexual Activity   Alcohol use: Not Currently   Drug use: No   Sexual activity: Never  Other Topics Concern   Not on file  Social History Narrative   Married.  Lives at home with wife.    Social Drivers of Corporate investment banker Strain: Low Risk  (06/16/2024)   Received from [facility]   Overall Financial Resource Strain (CARDIA)    Difficulty of Paying Living Expenses: Not hard at all  Food Insecurity: No Food Insecurity (06/16/2024)   Received from [facility]  Hunger Vital Sign    Within the past 12 months, you worried that your food would run out before you got the money to buy more.: Never true    Within the past 12 months, the food you bought just didn't last and you didn't have money to get more.: Never true  Transportation Needs: No Transportation Needs (06/16/2024)   Received from [facility] - Transportation    In the past 12 months, has lack of transportation kept you from medical  appointments or from getting medications?: No    Lack of Transportation (Non-Medical): No  Physical Activity: Inactive (10/20/2023)   Exercise Vital Sign    Days of Exercise per Week: 0 days    Minutes of Exercise per Session: 0 min  Stress: No Stress Concern Present (10/20/2023)   [employer] of Occupational Health - Occupational Stress Questionnaire    Feeling of Stress : Only a little  Social Connections: Moderately Integrated (10/20/2023)   Social Connection and Isolation Panel    Frequency of Communication with Friends and Family: More than three times a week    Frequency of Social Gatherings with Friends and Family: Twice a week    Attends Religious Services: 1 to 4 times per year    Active Member of [employer] or Organizations: No    Attends Banker Meetings: Never    Marital Status: Married  Catering manager Violence: Not At Risk (10/20/2023)   Humiliation, Afraid, Rape, and Kick questionnaire    Fear of Current or Ex-Partner: No    Emotionally Abused: No    Physically Abused: No    Sexually Abused: No    Family History  Problem Relation Age of Onset   Hypertension Mother    Lupus Mother    Heart Problems Mother    Hypertension Father    Diabetes Father    Heart attack Father 38       died in his 54s   Drug abuse Brother    Heart disease Brother    Heart attack Maternal Grandmother    Arthritis Maternal Grandmother    Arthritis Maternal Grandfather    Heart attack Paternal Grandmother    Arthritis Paternal Grandmother    Arthritis Paternal Grandfather    Allergies  Allergen Reactions   Latex Other (See Comments)    Blister and burning (34M insulin  pump latex band aid )   Nitroglycerin  Nausea Only and Other (See Comments)    Patches only - headache   Statins Other (See Comments)    BODY PAIN. Taking rosuvastatin  in the evenings and tolerating.    Fluvastatin Other (See Comments)    Muscle pain   Fluvastatin Sodium Other (See Comments)     Myalgias   Percocet [Oxycodone -Acetaminophen ] Itching   Rosuvastatin  Other (See Comments)    Myalgias. Taking in evening and tolerating   Simvastatin Other (See Comments)    Muscle pain, Myalgias   Lipitor [Atorvastatin] Other (See Comments) and Rash    BODY PAIN   Lisinopril  Nausea Only and Other (See Comments)    Other reaction(s): Dizziness   Zetia [Ezetimibe] Other (See Comments)    Muscle Pain  Meds I?- Doxycycline  - Zonisamide - Aimovig - Vitamin C  - Aspirin  - Calcium  - Lactulose  - Carvedilol - Vitamin D3 - Clopidogrel  -- Furosemide  - Famotidine  - Insulin  - Isosorbide  - Loratadine  - Nitroglycerin  as needed - Oxycodone  - Rosuvastatin  - Flonase  Current Outpatient Medications  Medication Sig Dispense Refill  acetaminophen  (TYLENOL ) 500 MG tablet Take 1,000 mg by mouth every 6 (six) hours as needed for moderate pain (pain score 4-6).     AIMOVIG 140 MG/ML SOAJ Inject 140 mg into the skin every 30 (thirty) days.     albuterol  (VENTOLIN  HFA) 108 (90 Base) MCG/ACT inhaler Inhale 2 puffs into the lungs every 4 (four) hours as needed for wheezing. 1 each 0   amLODipine (NORVASC) 2.5 MG tablet Take 2.5 mg by mouth daily.     amoxicillin  (AMOXIL ) 500 MG capsule Take 2 capsules (1,000 mg total) by mouth 3 (three) times daily. 100 capsule 0   amoxicillin  (AMOXIL ) 500 MG tablet Take 2,000 mg by mouth See admin instructions. Take 4 capsules (2000 mg) by mouth 1 hour prior to dental appointments     aspirin  81 MG EC tablet Take 81 mg by mouth daily. Swallow whole.     Atogepant (QULIPTA) 60 MG TABS Take 60 mg by mouth 1 day or 1 dose.     azelastine (ASTELIN) 0.1 % nasal spray Place 2 sprays into both nostrils as needed for rhinitis. Use in each nostril as directed     Calcium  Carb-Cholecalciferol  (CALCIUM  + D3 PO) Take 1 tablet by mouth in the morning and at bedtime.     carvedilol (COREG) 12.5 MG tablet Take 12.5 mg by mouth 2 (two) times daily with a meal. (Patient taking  differently: Take 12.5 mg by mouth 2 (two) times daily with a meal. Taking half a tab as of 08/16/24)     Cholecalciferol  (VITAMIN D ) 50 MCG (2000 UT) tablet Take 2,000 Units by mouth in the morning and at bedtime.     clopidogrel  (PLAVIX ) 75 MG tablet Take 1 tablet (75 mg total) by mouth daily. 30 tablet 0   doxycycline  (VIBRA -TABS) 100 MG tablet Take 1 tablet (100 mg total) by mouth 2 (two) times daily. 20 tablet 0   famotidine  (ACID CONTROLLER) 10 MG tablet Take 10 mg by mouth in the morning.     furosemide  (LASIX ) 40 MG tablet Take 1 tablet (40 mg total) by mouth daily. 30 tablet 1   glucagon 1 MG injection Inject 1 mg into the skin once as needed.     GLUTOSE 15 40 % GEL Take 1 Tube by mouth once as needed for low blood sugar.     insulin  aspart (NOVOLOG ) 100 UNIT/ML injection Inject 20 Units into the skin 3 (three) times daily. (Patient taking differently: Inject 10-20 Units into the skin 3 (three) times daily with meals. Sliding Scale will go up to 25 units for BG > 300 - 400) 10 mL 5   insulin  glargine (LANTUS  SOLOSTAR) 100 UNIT/ML Solostar Pen 20 Units.     insulin  glargine-yfgn (SEMGLEE ) 100 UNIT/ML injection Inject 20 Units into the skin at bedtime.     isosorbide  mononitrate (IMDUR ) 60 MG 24 hr tablet Take 60 mg by mouth in the morning and at bedtime.     loratadine  (CLARITIN ) 10 MG tablet Take 10 mg by mouth in the morning.     nitroGLYCERIN  (NITROSTAT ) 0.4 MG SL tablet DISSOLVE 1 TABLET UNDER THE TONGUE EVERY 5 MINUTES AS NEEDED FOR CHEST PAIN 30 tablet 0   potassium chloride  SA (K-DUR,KLOR-CON ) 20 MEQ tablet Take 1 tablet (20 mEq total) by mouth daily. 30 tablet 1   rosuvastatin  (CRESTOR ) 20 MG tablet Take 1 tablet (20 mg total) by mouth daily at 6 PM. 30 tablet 1   Travoprost, BAK Free, (TRAVATAN) 0.004 %  SOLN ophthalmic solution Place 1 drop into both eyes at bedtime.      vitamin C  (ASCORBIC ACID ) 500 MG tablet Take 500 mg by mouth 2 (two) times daily.      zonisamide (ZONEGRAN)  100 MG capsule Take 3 capsules (300 mg total) by mouth once daily .     No current facility-administered medications for this visit.     Abtx:  Anti-infectives (From admission, onward)    None       REVIEW OF SYSTEMS:  Const: negative fever, negative chills, negative weight loss Eyes: negative diplopia or visual changes, negative eye pain ENT: negative coryza, negative sore throat Resp: negative cough, hemoptysis, dyspnea Cards: negative for chest pain, palpitations, lower extremity edema GU: negative for frequency, dysuria and hematuria GI: Negative for abdominal pain, diarrhea, bleeding, constipation Skin: negative for rash and pruritus Heme: negative for easy bruising and gum/nose bleeding [id number] Discharge left foot wound Neurolo: headaches, Psych: negative for feelings of anxiety, depression  Endocrine: negative for thyroid , diabetes Allergy/Immunology- negative for any medication or food allergies ? Pertinent Positives include : Objective:  VITALS:  BP 114/65   Pulse 65   Temp 98 F (36.7 C) (Temporal)   SpO2 97%   PHYSICAL EXAM:  General: Alert, cooperative, no distress, appears stated age.  Head: Normocephalic, without obvious abnormality, atraumatic. Eyes: Conjunctivae clear, anicteric sclerae. Pupils are equal ENT Nares normal. No drainage or sinus tenderness. Lips, mucosa, and tongue normal. No Thrush Neck: Supple, symmetrical, no adenopathy, thyroid : non tender no carotid bruit and no JVD. Back: No CVA tenderness. Lungs: Clear to auscultation bilaterally. No Wheezing or Rhonchi. No rales. Heart: Regular rate and rhythm, no murmur, rub or gallop. Abdomen: Soft, non-tender,not distended. Bowel sounds normal. No masses Extremities:         08/02/24       Skin: No rashes or lesions. Or bruising Lymph: Cervical, supraclavicular normal. 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 We may reduce to 500mg  TID after that  Cad s/p CABG on plavix , rosuvastatin   Vestibular migraine on zonisamide and aimovig ? Left TKA  ____________________________________ Discussed with patient and wife Follow up 4 weeks

## 2024-08-18 NOTE — Patient Instructions (Addendum)
 You are here for follow up of the left foot infection and rt 3rd toe infection. You are on 1 gram Amoxicillin  three times a day Will reduce the amoxicillin  to 500mg  three times a day Follow up 1 month

## 2024-08-18 NOTE — Progress Notes (Addendum)
 Subjective:    Patient ID: Lawrence [name], male    DOB: November 21, 1950, 73 y.o.   MRN: [id number] Chief Complaint  Patient presents with   Follow-up    3 month with abi and le arterial    Lawrence [name] is a 73 yo male who who presents to clinic today for 71-month follow-up with bilateral lower extremity arterial duplex ultrasounds and ABIs.  Patient does not have any complaints today related to his lower extremities.  Denies any pain.  He does have swelling to his lower extremities.  He admits he tries to wear his compression socks is much as he can but he is not very compliant with them.  Patient saw Dr. Janit 2 days ago and recommends that the patient be seen by infectious disease for wounds to both of his feet.  He was placed on doxycycline  as prophylactic treatment until cultures returned and antibiotics can be specified.  Right ABI today is noncompressible.  Previous ABIs were noncompressible as well. Left ABI today is 1.34.  Previous left ABI was 1.25.  His resting left ankle-brachial index is within normal range.  ABIs are better today than they were 3 months ago.  Patient's arterial duplex ultrasounds and ABIs are basically unchanged from prior of 3 months ago.  We will continue to follow and evaluate.     Review of Systems  Constitutional: Negative.   Cardiovascular:  Positive for leg swelling.  Musculoskeletal:  Positive for gait problem and myalgias.       Patient wears bilateral flat shoes due to wounds on his feet.  This creates an abnormal gait for him but he still using a cane to ambulate  Skin:  Positive for wound.       Pictures of been placed in the patient's media file from a visit on 08/16/2024 with Dr. Janit of podiatry.  All other systems reviewed and are negative.      Objective:   Physical Exam Vitals reviewed.  Constitutional:      Appearance: Normal appearance. He is obese.  HENT:     Head: Normocephalic.  Eyes:     Pupils: Pupils are equal, round, and  reactive to light.  Cardiovascular:     Rate and Rhythm: Normal rate and regular rhythm.     Pulses: Normal pulses.     Heart sounds: Normal heart sounds.  Pulmonary:     Effort: Pulmonary effort is normal.     Breath sounds: Normal breath sounds.  Abdominal:     General: Bowel sounds are normal.     Palpations: Abdomen is soft.  Musculoskeletal:        General: Swelling present.     Right lower leg: Edema present.     Left lower leg: Edema present.  Skin:    General: Skin is warm and dry.     Capillary Refill: Capillary refill takes 2 to 3 seconds. Right foot only capillary refill. Neurological:     General: No focal deficit present.     Mental Status: He is alert and oriented to person, place, and time. Mental status is at baseline.  Psychiatric:        Mood and Affect: Mood normal.        Behavior: Behavior normal.        Thought Content: Thought content normal.        Judgment: Judgment normal.     BP 130/65   Pulse 62   Resp 18   Ht 5'  10 (1.778 m)   Wt 295 lb (133.8 kg)   BMI 42.33 kg/m   Past Medical History:  Diagnosis Date   Allergies    Anginal pain    Aortic atherosclerosis    Bilateral carotid artery disease    Breast pain, left 08/29/2022   CAD (coronary artery disease) 07/2007   a.) s/p NSTEMI 07/2007 - sig multi-vessel CAD not amenable to PCI; b.) LHC 06/11/2016: multi vessel CAD not amenable to PCI --> refer to CVTS; c.) s/p 4v CABG 06/27/2016; d.) LHC 06/09/2023: 75% mLM, 80% o-pLAD, 60% mLAD, 100% p-mLCx, 100% OM1, 95% pRCA, 100% mRCA, 50% m-dLAD. LIMA-LAD and SVG-OM1 patent. CTO SVG-RCA with L-R collaterals - med mgmt   Carpal tunnel syndrome on left    a.) s/p release 04/2016   Cerebral microvascular disease    CKD (chronic kidney disease), stage III (HCC)    COVID-19 12/09/2022   Diabetic peripheral neuropathy (HCC)    Diastolic dysfunction    a.) TTE 06/25/2016: EF 60-654%. no RWMAs, G1DD, mild AoV annular calc; b.) TTE 03/19/2017: EF  50-55%, no RWMAs, mild MR; c.) TTE 07/22/2019: EF 55-60%, midl LVH, no RWMAs, triv MR, mild TR   Dizziness 04/30/2021   Dyspnea    GERD (gastroesophageal reflux disease)    Glaucoma    Headache    History of bilateral cataract extraction    HLD (hyperlipidemia)    HTN (hypertension)    Long term current use of clopidogrel     Long-term use of aspirin  therapy    MRSA (methicillin resistant staph aureus) culture positive    2006 - 2008   NSTEMI (non-ST elevated myocardial infarction) (HCC) 07/27/2007   a.) LHC 07/30/2007: 30% mLAD, 99% D1, 50% pLCx, 40% mLCx, 70/75/85% dLCx, 50% OM1, 75/80/80 OM2, 50% pRCA, 60% mRCA, 85% dRCA --> not amenable to PCI   OSA on CPAP    Osteoarthritis    Osteomyelitis of left foot (HCC)    PAD (peripheral artery disease)    a. 03/2022 Lower Ext Angio: No signif Ao-iliac dzs. R PT diff dzs and occluded above ankle. R Pedal arch intact w/ excellent antegrade flow provided by R AT-->no indication for revasc.   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   a.) LIMA-LAD, SVG-RCA, seq SVG-OM1-OM2   S/P tube myringotomy    Skin cancer, basal cell    Skin ulcer of right great toe, unspecified ulcer stage (HCC)    T1DM (type 1 diabetes mellitus) (HCC)    Wears glasses    Wears partial dentures    top    Social History   Socioeconomic History   Marital status: Married    Spouse name: Dalal,Lajuana   Number of children: Not on file   Years of education: Not on file   Highest education level: Not on file  Occupational History   Not on file  Tobacco Use   Smoking status: Former    Current packs/day: 0.00    Average packs/day: 1 pack/day for 16.7 years (16.7 ttl pk-yrs)    Types: Cigarettes    Start date: 11/03/1969    Quit date: 07/22/1986    Years since quitting: 38.1   Smokeless tobacco: Never  Vaping Use   Vaping status: Never Used  Substance and Sexual Activity   Alcohol use: Not Currently   Drug use: No    Sexual activity: Never  Other Topics Concern   Not on file  Social History Narrative   Married.  Lives at home with wife.    Social Drivers of Corporate investment banker Strain: Low Risk  (06/16/2024)   Received from [facility]   Overall Financial Resource Strain (CARDIA)    Difficulty of Paying Living Expenses: Not hard at all  Food Insecurity: No Food Insecurity (06/16/2024)   Received from [facility]   Hunger Vital Sign    Within the past 12 months, you worried that your food would run out before you got the money to buy more.: Never true    Within the past 12 months, the food you bought just didn't last and you didn't have money to get more.: Never true  Transportation Needs: No Transportation Needs (06/16/2024)   Received from [facility] - Transportation    In the past 12 months, has lack of transportation kept you from medical appointments or from getting medications?: No    Lack of Transportation (Non-Medical): No  Physical Activity: Inactive (10/20/2023)   Exercise Vital Sign    Days of Exercise per Week: 0 days    Minutes of Exercise per Session: 0 min  Stress: No Stress Concern Present (10/20/2023)   [employer] of Occupational Health - Occupational Stress Questionnaire    Feeling of Stress : Only a little  Social Connections: Moderately Integrated (10/20/2023)   Social Connection and Isolation Panel    Frequency of Communication with Friends and Family: More than three times a week    Frequency of Social Gatherings with Friends and Family: Twice a week    Attends Religious Services: 1 to 4 times per year    Active Member of [employer] or Organizations: No    Attends Banker Meetings: Never    Marital Status: Married  Catering manager Violence: Not At Risk (10/20/2023)   Humiliation, Afraid, Rape, and Kick questionnaire    Fear of Current or Ex-Partner: No    Emotionally Abused: No     Physically Abused: No    Sexually Abused: No    Past Surgical History:  Procedure Laterality Date   ABDOMINAL AORTOGRAM W/LOWER EXTREMITY N/A 03/26/2022   Procedure: ABDOMINAL AORTOGRAM W/LOWER EXTREMITY;  Surgeon: Darron Deatrice LABOR, MD;  [facility]: [facility];  Service: Cardiovascular;  Laterality: N/A;   AMPUTATION TOE Bilateral 06/26/2023   Procedure: AMPUTATION TOE MPJ OF THE JOINT,PARTIAL TOE AMPUTUATION;  Surgeon: Janit Thresa HERO, DPM;  Location: [facility];  Service: Orthopedics/Podiatry;  Laterality: Bilateral;   AMPUTATION TOE Right 12/04/2023   Procedure: AMPUTATION TOE INTERPHALANGEAL 3RD;  Surgeon: Janit Thresa HERO, DPM;  Location: [facility];  Service: Orthopedics/Podiatry;  Laterality: Right;   APPENDECTOMY     CARDIAC CATHETERIZATION N/A 06/11/2016   Procedure: Left Heart Cath and Coronary Angiography;  Surgeon: Wolm JINNY Rhyme, MD;  Location: [facility];  Service: Cardiovascular;  Laterality: N/A;   CARPAL TUNNEL RELEASE Left 04/24/2016   Procedure: CARPAL TUNNEL RELEASE;  Surgeon: Ozell Flake, MD;  Location: [facility];  Service: Orthopedics;  Laterality: Left;   CATARACT EXTRACTION W/ INTRAOCULAR LENS IMPLANT Right 07/16/2012   CATARACT EXTRACTION W/ INTRAOCULAR LENS IMPLANT Left 08/13/2012   CORONARY ARTERY BYPASS GRAFT N/A 06/27/2016   Procedure: CORONARY ARTERY BYPASS GRAFTING times four using left internal mammary artery and right leg saphenous vein;  Surgeon: Maude Fleeta Ochoa, MD;  Location[facility];  Service: Open Heart Surgery;  Laterality: N/A;   FOREARM FRACTURE SURGERY Left  1961   IRRIGATION AND DEBRIDEMENT ABSCESS Right 07/08/2024   Procedure: IRRIGATION AND DEBRIDEMENT ULCER, WITH GRAFT APPLICATION RIGHT THIRD TOE;  Surgeon: Janit Thresa HERO, DPM;  Location: [facility];  Service: Orthopedics/Podiatry;  Laterality: Right;   IRRIGATION AND DEBRIDEMENT FOOT Left 07/08/2024   Procedure: IRRIGATION AND DEBRIDEMENT FOOT AMPUTATION STUMB, BONE BIOPSY FIRST METATARSAL;   Surgeon: Janit Thresa HERO, DPM;  Location: [facility];  Service: Orthopedics/Podiatry;  Laterality: Left;   KNEE ARTHROPLASTY Left 02/11/2016   Procedure: COMPUTER ASSISTED TOTAL KNEE ARTHROPLASTY;  Surgeon: Lynwood SHAUNNA Hue, MD;  Location: [facility];  Service: Orthopedics;  Laterality: Left;   KNEE ARTHROSCOPY Right 2000   LEFT HEART CATH AND CORONARY ANGIOGRAPHY Left 07/30/2007   Procedure: LEFT HEART CATH AND CORONARY ANGIOGRAPHY; Location: [facility]; Surgeon: Wolm Rhyme, MD   LEFT HEART CATH AND CORS/GRAFTS ANGIOGRAPHY Left 01/13/2017   Procedure: Left Heart Cath and Cors/Grafts Angiography;  Surgeon: Wolm JINNY Rhyme, MD;  Location: [facility];  Service: Cardiovascular;  Laterality: Left;   LEFT HEART CATH AND CORS/GRAFTS ANGIOGRAPHY Left 06/09/2023   Procedure: LEFT HEART CATH AND CORS/GRAFTS ANGIOGRAPHY;  Surgeon: Ammon Blunt, MD;  Location: [facility];  Service: Cardiovascular;  Laterality: Left;   LOWER EXTREMITY ANGIOGRAPHY Left 02/23/2024   Procedure: Lower Extremity Angiography;  Surgeon: Jama Cordella MATSU, MD;  Location: [facility];  Service: Cardiovascular;  Laterality: Left;   LOWER EXTREMITY ANGIOGRAPHY Right 05/03/2024   Procedure: Lower Extremity Angiography;  Surgeon: Jama Cordella MATSU, MD;  Location: [facility];  Service: Cardiovascular;  Laterality: Right;   LOWER EXTREMITY INTERVENTION Left 02/23/2024   Procedure: LOWER EXTREMITY INTERVENTION;  Surgeon: Jama Cordella MATSU, MD;  Location: [facility];  Service: Cardiovascular;  Laterality: Left;   METATARSAL HEAD EXCISION Right 03/21/2020   Procedure: METATARSAL HEAD RESECTION RIGHT AND DEBRIDEMENT OF ULCER;  Surgeon: Janit Thresa HERO, DPM;  Location: [facility];  Service: Podiatry;  Laterality: Right;   METATARSAL HEAD EXCISION Left 09/15/2023   Procedure: METATARSAL HEAD EXCISION;  Surgeon: Janit Thresa HERO, DPM;  Location: [facility];  Service: Orthopedics/Podiatry;  Laterality:  Left;   MULTIPLE TOOTH EXTRACTIONS     TEE WITHOUT CARDIOVERSION N/A 06/27/2016   Procedure: TRANSESOPHAGEAL ECHOCARDIOGRAM (TEE);  Surgeon: Maude Fleeta Ochoa, MD;  [facility];  Service: Open Heart Surgery;  Laterality: N/A;   TENDON LENGTHENING Left 12/04/2023   Procedure: TENDON-ACHILLES LENGTHENING;  Surgeon: Janit Thresa HERO, DPM;  Location: [facility];  Service: Orthopedics/Podiatry;  Laterality: Left;   TRANSMETATARSAL AMPUTATION Left 12/04/2023   Procedure: TRANSMETATARSAL AMPUTATION;  Surgeon: Janit Thresa HERO, DPM;  Location: [facility];  Service: Orthopedics/Podiatry;  Laterality: Left;    Family History  Problem Relation Age of Onset   Hypertension Mother    Lupus Mother    Heart Problems Mother    Hypertension Father    Diabetes Father    Heart attack Father 26       died in his 7s   Drug abuse Brother    Heart disease Brother    Heart attack Maternal Grandmother    Arthritis Maternal Grandmother    Arthritis Maternal Grandfather    Heart attack Paternal Grandmother    Arthritis Paternal Grandmother    Arthritis Paternal Grandfather     Allergies  Allergen Reactions   Latex Other (See Comments)    Blister and burning (83M insulin  pump latex band aid )   Nitroglycerin  Nausea Only and Other (See Comments)  Patches only - headache   Statins Other (See Comments)    BODY PAIN. Taking rosuvastatin  in the evenings and tolerating.    Fluvastatin Other (See Comments)    Muscle pain   Fluvastatin Sodium Other (See Comments)    Myalgias   Percocet [Oxycodone -Acetaminophen ] Itching   Rosuvastatin  Other (See Comments)    Myalgias. Taking in evening and tolerating   Simvastatin Other (See Comments)    Muscle pain, Myalgias   Lipitor [Atorvastatin] Other (See Comments) and Rash    BODY PAIN   Lisinopril  Nausea Only and Other (See Comments)    Other reaction(s): Dizziness   Zetia [Ezetimibe] Other (See Comments)    Muscle Pain       Latest Ref Rng & Units 08/18/2024    12:46 PM 07/01/2024   11:00 AM 09/09/2023    2:38 PM  CBC  WBC 4.0 - 10.5 K/uL 7.4  7.4  7.2   Hemoglobin 13.0 - 17.0 g/dL 87.8  87.5  87.3   Hematocrit 39.0 - 52.0 % 35.8  36.7  36.9   Platelets 150 - 400 K/uL 191  187  191       CMP     Component Value Date/Time   NA 141 08/18/2024 1246   NA 141 06/09/2024 0000   NA 137 10/12/2013 0515   K 3.9 08/18/2024 1246   K 3.9 10/12/2013 0515   CL 101 08/18/2024 1246   CL 101 10/12/2013 0515   CO2 28 08/18/2024 1246   CO2 31 10/12/2013 0515   GLUCOSE 110 (H) 08/18/2024 1246   GLUCOSE 218 (H) 10/12/2013 0515   BUN 28 (H) 08/18/2024 1246   BUN 20 06/09/2024 0000   BUN 20 (H) 10/12/2013 0515   CREATININE 1.37 (H) 08/18/2024 1246   CREATININE 0.99 10/12/2013 0515   CALCIUM  9.7 08/18/2024 1246   CALCIUM  8.8 10/12/2013 0515   PROT 7.4 08/18/2024 1246   PROT 6.6 02/25/2022 1150   ALBUMIN  4.0 08/18/2024 1246   ALBUMIN  4.3 02/25/2022 1150   AST 16 08/18/2024 1246   ALT 12 08/18/2024 1246   ALKPHOS 85 08/18/2024 1246   BILITOT 0.9 08/18/2024 1246   BILITOT 0.7 02/25/2022 1150   GFR 52.06 (L) 11/02/2023 1454   EGFR 57.1 06/09/2024 0000   EGFR 57.1 06/09/2024 0000   EGFR 51 (L) 02/25/2022 1150   GFRNONAA 54 (L) 08/18/2024 1246   GFRNONAA >60 10/12/2013 0515     No results found.     Assessment & Plan:   1. Atherosclerosis of native arteries of the extremities with ulceration (HCC) (Primary) Patient returns to clinic for 83-month follow-up of bilateral lower extremities.  He underwent arterial duplex ultrasounds with ABIs.  These are basically unchanged from 3 months prior.  He does have bilateral lower extremity swelling.  He has compression socks at home but does not wear them time.  He is also not elevating his feet.  His wife who is at the visit today with him states he has a recliner but he sits with his feet down most of the day.  This is not helping his bilateral lower extremity swelling.  He is also not moving much.  Wife  says he sits around all day instead of at least 10 minutes of exercise at 1 time.  Patient states because his gait is not very well with the wounds to his feet and therefore he refuses to exercise.  He has noted nonhealing wounds to bilateral feet.  Patient was just seen  by Dr. Janit on 08/16/2024.  He recommends ID consultation for long-term antibiotics.  He was started on doxycycline  at that visit.  No changes to this therapy at this time.  I recommended the patient be more consistent with conservative therapy.  He needs to wear compression socks every day from the time he gets up in the morning till the time he goes to bed.  He does not sleep in the.  He also needs to elevate his lower extremities when he is sitting during the day in his recliner at night keep this feet dependent.  He is also to exercise 10 minutes a day.  I urged him to take a 10-minute walk in the morning to help with his energy during the day and help with the atrophy to his muscles of his lower extremities.  I understand with his feet issues and the shoes he has to wear he has to go slower but I still encouraged him to do so.  His wife says they have a ramp placed at the house with railings that he can use to walk up and down all day long features stated.  Patient will follow-up with us  again in 3 months with bilateral lower extremity arterial duplex ultrasounds with ABIs for continued monitoring.  2. Benign essential HTN Continue antihypertensive medications as already ordered, these medications have been reviewed and there are no changes at this time.  3. Hyperlipidemia, unspecified hyperlipidemia type Continue statin as ordered and reviewed, no changes at this time   Current Outpatient Medications on File Prior to Visit  Medication Sig Dispense Refill   acetaminophen  (TYLENOL ) 500 MG tablet Take 1,000 mg by mouth every 6 (six) hours as needed for moderate pain (pain score 4-6).     AIMOVIG 140 MG/ML SOAJ Inject 140 mg  into the skin every 30 (thirty) days.     albuterol  (VENTOLIN  HFA) 108 (90 Base) MCG/ACT inhaler Inhale 2 puffs into the lungs every 4 (four) hours as needed for wheezing. 1 each 0   aspirin  81 MG EC tablet Take 81 mg by mouth daily. Swallow whole.     Atogepant (QULIPTA) 60 MG TABS Take 60 mg by mouth 1 day or 1 dose.     azelastine (ASTELIN) 0.1 % nasal spray Place 2 sprays into both nostrils as needed for rhinitis. Use in each nostril as directed     Calcium  Carb-Cholecalciferol  (CALCIUM  + D3 PO) Take 1 tablet by mouth in the morning and at bedtime.     carvedilol (COREG) 12.5 MG tablet Take 12.5 mg by mouth 2 (two) times daily with a meal. (Patient taking differently: Take 12.5 mg by mouth 2 (two) times daily with a meal. Taking half a tab as of 08/16/24)     Cholecalciferol  (VITAMIN D ) 50 MCG (2000 UT) tablet Take 2,000 Units by mouth in the morning and at bedtime.     clopidogrel  (PLAVIX ) 75 MG tablet Take 1 tablet (75 mg total) by mouth daily. 30 tablet 0   famotidine  (ACID CONTROLLER) 10 MG tablet Take 10 mg by mouth in the morning.     furosemide  (LASIX ) 40 MG tablet Take 1 tablet (40 mg total) by mouth daily. 30 tablet 1   glucagon 1 MG injection Inject 1 mg into the skin once as needed.     GLUTOSE 15 40 % GEL Take 1 Tube by mouth once as needed for low blood sugar.     insulin  aspart (NOVOLOG ) 100 UNIT/ML injection Inject 20 Units  into the skin 3 (three) times daily. (Patient taking differently: Inject 10-20 Units into the skin 3 (three) times daily with meals. Sliding Scale will go up to 25 units for BG > 300 - 400) 10 mL 5   insulin  glargine-yfgn (SEMGLEE ) 100 UNIT/ML injection Inject 20 Units into the skin at bedtime.     isosorbide  mononitrate (IMDUR ) 60 MG 24 hr tablet Take 60 mg by mouth in the morning and at bedtime.     loratadine  (CLARITIN ) 10 MG tablet Take 10 mg by mouth in the morning.     nitroGLYCERIN  (NITROSTAT ) 0.4 MG SL tablet DISSOLVE 1 TABLET UNDER THE TONGUE EVERY 5  MINUTES AS NEEDED FOR CHEST PAIN 30 tablet 0   potassium chloride  SA (K-DUR,KLOR-CON ) 20 MEQ tablet Take 1 tablet (20 mEq total) by mouth daily. 30 tablet 1   rosuvastatin  (CRESTOR ) 20 MG tablet Take 1 tablet (20 mg total) by mouth daily at 6 PM. 30 tablet 1   Travoprost, BAK Free, (TRAVATAN) 0.004 % SOLN ophthalmic solution Place 1 drop into both eyes at bedtime.      vitamin C  (ASCORBIC ACID ) 500 MG tablet Take 500 mg by mouth 2 (two) times daily.      zonisamide (ZONEGRAN) 100 MG capsule Take 3 capsules (300 mg total) by mouth once daily .     amLODipine (NORVASC) 2.5 MG tablet Take 2.5 mg by mouth daily.     amoxicillin  (AMOXIL ) 500 MG capsule Take 1 capsule (500 mg total) by mouth 3 (three) times daily. 90 capsule 0   insulin  glargine (LANTUS  SOLOSTAR) 100 UNIT/ML Solostar Pen 20 Units.     No current facility-administered medications on file prior to visit.    There are no Patient Instructions on file for this visit. No follow-ups on file.   Gwendlyn JONELLE Shank, NP

## 2024-08-19 ENCOUNTER — Telehealth: Payer: Self-pay | Admitting: Primary Care

## 2024-08-19 ENCOUNTER — Ambulatory Visit

## 2024-08-19 ENCOUNTER — Other Ambulatory Visit: Payer: Self-pay | Admitting: Family Medicine

## 2024-08-19 DIAGNOSIS — Z0279 Encounter for issue of other medical certificate: Secondary | ICD-10-CM

## 2024-08-19 NOTE — Progress Notes (Unsigned)
 Patient stopped oi

## 2024-08-22 LAB — VAS US ABI WITH/WO TBI: Left ABI: 1.34

## 2024-08-26 ENCOUNTER — Telehealth: Payer: Self-pay

## 2024-08-26 NOTE — Telephone Encounter (Signed)
 Ppw received back for Dm shoes I called and spoke with Mrs. They will stop by today and pick up  Ppw needs to be signed and given to me so we can add charges   Lolita

## 2024-09-02 ENCOUNTER — Ambulatory Visit: Admitting: Podiatry

## 2024-09-02 ENCOUNTER — Encounter: Payer: Self-pay | Admitting: Podiatry

## 2024-09-02 VITALS — Ht 70.0 in | Wt 295.0 lb

## 2024-09-02 DIAGNOSIS — L97512 Non-pressure chronic ulcer of other part of right foot with fat layer exposed: Secondary | ICD-10-CM | POA: Diagnosis not present

## 2024-09-02 DIAGNOSIS — E08621 Diabetes mellitus due to underlying condition with foot ulcer: Secondary | ICD-10-CM

## 2024-09-02 NOTE — Progress Notes (Signed)
 Chief Complaint  Patient presents with   Wound Check    Pt is here to f/u on wounds to bilateral feet.    Subjective:  Patient presents today status post B/L foot surgery.  DOS: 07/08/2024.  Past Medical History:  Diagnosis Date   Allergies    Anginal pain    Aortic atherosclerosis    Bilateral carotid artery disease    Breast pain, left 08/29/2022   CAD (coronary artery disease) 07/2007   a.) s/p NSTEMI 07/2007 - sig multi-vessel CAD not amenable to PCI; b.) LHC 06/11/2016: multi vessel CAD not amenable to PCI --> refer to CVTS; c.) s/p 4v CABG 06/27/2016; d.) LHC 06/09/2023: 75% mLM, 80% o-pLAD, 60% mLAD, 100% p-mLCx, 100% OM1, 95% pRCA, 100% mRCA, 50% m-dLAD. LIMA-LAD and SVG-OM1 patent. CTO SVG-RCA with L-R collaterals - med mgmt   Carpal tunnel syndrome on left    a.) s/p release 04/2016   Cerebral microvascular disease    CKD (chronic kidney disease), stage III (HCC)    COVID-19 12/09/2022   Diabetic peripheral neuropathy (HCC)    Diastolic dysfunction    a.) TTE 06/25/2016: EF 60-654%. no RWMAs, G1DD, mild AoV annular calc; b.) TTE 03/19/2017: EF 50-55%, no RWMAs, mild MR; c.) TTE 07/22/2019: EF 55-60%, midl LVH, no RWMAs, triv MR, mild TR   Dizziness 04/30/2021   Dyspnea    GERD (gastroesophageal reflux disease)    Glaucoma    Headache    History of bilateral cataract extraction    HLD (hyperlipidemia)    HTN (hypertension)    Long term current use of clopidogrel     Long-term use of aspirin  therapy    MRSA (methicillin resistant staph aureus) culture positive    2006 - 2008   NSTEMI (non-ST elevated myocardial infarction) (HCC) 07/27/2007   a.) LHC 07/30/2007: 30% mLAD, 99% D1, 50% pLCx, 40% mLCx, 70/75/85% dLCx, 50% OM1, 75/80/80 OM2, 50% pRCA, 60% mRCA, 85% dRCA --> not amenable to PCI   OSA on CPAP    Osteoarthritis    Osteomyelitis of left foot (HCC)    PAD (peripheral artery disease)    a. 03/2022 Lower Ext Angio: No signif Ao-iliac dzs. R PT diff dzs and  occluded above ankle. R Pedal arch intact w/ excellent antegrade flow provided by R AT-->no indication for revasc.   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   a.) LIMA-LAD, SVG-RCA, seq SVG-OM1-OM2   S/P tube myringotomy    Skin cancer, basal cell    Skin ulcer of right great toe, unspecified ulcer stage (HCC)    T1DM (type 1 diabetes mellitus) (HCC)    Wears glasses    Wears partial dentures    top    Past Surgical History:  Procedure Laterality Date   ABDOMINAL AORTOGRAM W/LOWER EXTREMITY N/A 03/26/2022   Procedure: ABDOMINAL AORTOGRAM W/LOWER EXTREMITY;  Surgeon: Darron Deatrice LABOR, MD;  [facility]: [facility];  Service: Cardiovascular;  Laterality: N/A;   AMPUTATION TOE Bilateral 06/26/2023   Procedure: AMPUTATION TOE MPJ OF THE JOINT,PARTIAL TOE AMPUTUATION;  Surgeon: Janit Thresa HERO, DPM;  Location: [facility];  Service: Orthopedics/Podiatry;  Laterality: Bilateral;   AMPUTATION TOE Right 12/04/2023   Procedure: AMPUTATION TOE INTERPHALANGEAL 3RD;  Surgeon: Janit Thresa HERO, DPM;  Location: [facility];  Service: Orthopedics/Podiatry;  Laterality: Right;   APPENDECTOMY     CARDIAC CATHETERIZATION N/A 06/11/2016   Procedure: Left Heart Cath and Coronary Angiography;  Surgeon: Wolm JINNY Rhyme, MD;  Location: [facility];  Service: Cardiovascular;  Laterality: N/A;   CARPAL TUNNEL RELEASE Left 04/24/2016   Procedure: CARPAL TUNNEL RELEASE;  Surgeon: Ozell Flake, MD;  Location: [facility];  Service: Orthopedics;  Laterality: Left;   CATARACT EXTRACTION W/ INTRAOCULAR LENS IMPLANT Right 07/16/2012   CATARACT EXTRACTION W/ INTRAOCULAR LENS IMPLANT Left 08/13/2012   CORONARY ARTERY BYPASS GRAFT N/A 06/27/2016   Procedure: CORONARY ARTERY BYPASS GRAFTING times four using left internal mammary artery and right leg saphenous vein;  Surgeon: Maude Fleeta Ochoa, MD;  Location: [facility] OR;  Service: Open Heart Surgery;  Laterality: N/A;   FOREARM  FRACTURE SURGERY Left 1961   IRRIGATION AND DEBRIDEMENT ABSCESS Right 07/08/2024   Procedure: IRRIGATION AND DEBRIDEMENT ULCER, WITH GRAFT APPLICATION RIGHT THIRD TOE;  Surgeon: Janit Thresa HERO, DPM;  Location: [facility];  Service: Orthopedics/Podiatry;  Laterality: Right;   IRRIGATION AND DEBRIDEMENT FOOT Left 07/08/2024   Procedure: IRRIGATION AND DEBRIDEMENT FOOT AMPUTATION STUMB, BONE BIOPSY FIRST METATARSAL;  Surgeon: Janit Thresa HERO, DPM;  Location: [facility];  Service: Orthopedics/Podiatry;  Laterality: Left;   KNEE ARTHROPLASTY Left 02/11/2016   Procedure: COMPUTER ASSISTED TOTAL KNEE ARTHROPLASTY;  Surgeon: Lynwood SHAUNNA Hue, MD;  Location: [facility];  Service: Orthopedics;  Laterality: Left;   KNEE ARTHROSCOPY Right 2000   LEFT HEART CATH AND CORONARY ANGIOGRAPHY Left 07/30/2007   Procedure: LEFT HEART CATH AND CORONARY ANGIOGRAPHY; Location: [facility]; Surgeon: Wolm Rhyme, MD   LEFT HEART CATH AND CORS/GRAFTS ANGIOGRAPHY Left 01/13/2017   Procedure: Left Heart Cath and Cors/Grafts Angiography;  Surgeon: Wolm JINNY Rhyme, MD;  Location: [facility];  Service: Cardiovascular;  Laterality: Left;   LEFT HEART CATH AND CORS/GRAFTS ANGIOGRAPHY Left 06/09/2023   Procedure: LEFT HEART CATH AND CORS/GRAFTS ANGIOGRAPHY;  Surgeon: Ammon Blunt, MD;  Location: [facility];  Service: Cardiovascular;  Laterality: Left;   LOWER EXTREMITY ANGIOGRAPHY Left 02/23/2024   Procedure: Lower Extremity Angiography;  Surgeon: Jama Cordella MATSU, MD;  Location: [facility];  Service: Cardiovascular;  Laterality: Left;   LOWER EXTREMITY ANGIOGRAPHY Right 05/03/2024   Procedure: Lower Extremity Angiography;  Surgeon: Jama Cordella MATSU, MD;  Location: [facility];  Service: Cardiovascular;  Laterality: Right;   LOWER EXTREMITY INTERVENTION Left 02/23/2024   Procedure: LOWER EXTREMITY INTERVENTION;  Surgeon: Jama Cordella MATSU, MD;  Location: [facility];  Service:  Cardiovascular;  Laterality: Left;   METATARSAL HEAD EXCISION Right 03/21/2020   Procedure: METATARSAL HEAD RESECTION RIGHT AND DEBRIDEMENT OF ULCER;  Surgeon: Janit Thresa HERO, DPM;  Location: [facility];  Service: Podiatry;  Laterality: Right;   METATARSAL HEAD EXCISION Left 09/15/2023   Procedure: METATARSAL HEAD EXCISION;  Surgeon: Janit Thresa HERO, DPM;  Location: [facility];  Service: Orthopedics/Podiatry;  Laterality: Left;   MULTIPLE TOOTH EXTRACTIONS     TEE WITHOUT CARDIOVERSION N/A 06/27/2016   Procedure: TRANSESOPHAGEAL ECHOCARDIOGRAM (TEE);  Surgeon: Maude Fleeta Ochoa, MD;  Location[facility] OR;  Service: Open Heart Surgery;  Laterality: N/A;   TENDON LENGTHENING Left 12/04/2023   Procedure: TENDON-ACHILLES LENGTHENING;  Surgeon: Janit Thresa HERO, DPM;  Location: [facility];  Service: Orthopedics/Podiatry;  Laterality: Left;   TRANSMETATARSAL AMPUTATION Left 12/04/2023   Procedure: TRANSMETATARSAL AMPUTATION;  Surgeon: Janit Thresa HERO, DPM;  Location: [facility];  Service: Orthopedics/Podiatry;  Laterality: Left;    Allergies  Allergen Reactions   Latex Other (See Comments)    Blister and burning (29M insulin  pump latex band aid )  Nitroglycerin  Nausea Only and Other (See Comments)    Patches only - headache   Statins Other (See Comments)    BODY PAIN. Taking rosuvastatin  in the evenings and tolerating.    Fluvastatin Other (See Comments)    Muscle pain   Fluvastatin Sodium Other (See Comments)    Myalgias   Percocet [Oxycodone -Acetaminophen ] Itching   Rosuvastatin  Other (See Comments)    Myalgias. Taking in evening and tolerating   Simvastatin Other (See Comments)    Muscle pain, Myalgias   Lipitor [Atorvastatin] Other (See Comments) and Rash    BODY PAIN   Lisinopril  Nausea Only and Other (See Comments)    Other reaction(s): Dizziness   Zetia [Ezetimibe] Other (See Comments)    Muscle Pain   Objective/Physical Exam Ulcer noted to the distal tip of the right third toe stable.  Intermixed  fibrotic and granular tissue noted.  The wound measures approximately 1.0 x 1.2 x 0.2 cm.  To the noted ulceration there is no eschar.  Moderate slough fibrin and necrotic tissue noted to the wound base.  No drainage.  No malodor. Stable.    MR FOOT LEFT WO CONTRAST 06/06/2024 IMPRESSION: 1. Marrow edema in the distal 3 cm of the first metatarsal remnant is worrisome for osteomyelitis. Minimal marrow edema in the distal 1.5-2 cm of the second and third metatarsal remnants is nonspecific and could be reactive or due to early osteomyelitis. 2. Fluid collection anterior and subjacent to the first metatarsal remnant is worrisome for abscess. 3. Diffuse subcutaneous edema about the imaged foot could be due to cellulitis or dependent change.  Aerobic/Anaerobic Culture w Gram Stain (surgical/deep wound) Order: [id number]  Status: Final result     Next appt: 07/26/2024 at 10:00 AM in Podiatry [facility] CHRISTELLA Sar, [place])   Test Result Released: Yes (not seen)   Specimen Information: Path Tissue  0 Result Notes    Component Ref Range & Units (hover) 7 d ago  Specimen Description TISSUE Performed at [facility], [address] [place], [place] [address]  Special Requests NONE Performed at [facility], [address] [place], [place] [address]  Gram Stain NO WBC SEEN NO ORGANISMS SEEN  Culture RARE STAPHYLOCOCCUS SAPROPHYTICUS NO ANAEROBES ISOLATED Performed at [facility], [address], [place], [place] [address]  Report Status 07/13/2024 FINAL  Organism ID, Bacteria STAPHYLOCOCCUS SAPROPHYTICUS  Resulting Agency [facility]     Susceptibility   Staphylococcus saprophyticus    MIC    CIPROFLOXACIN  <=0.5 SENSI... Sensitive    CLINDAMYCIN >=8 RESISTANT Resistant    ERYTHROMYCIN >=8 RESISTANT Resistant    GENTAMICIN  <=0.5 SENSI... Sensitive    Inducible Clindamycin NEGATIVE Sensitive    OXACILLIN 2 RESISTANT Resistant    RIFAMPIN <=0.5 SENSI... Sensitive     TETRACYCLINE <=1 SENSITIVE Sensitive    TRIMETH /SULFA  <=10 SENSIT... Sensitive    VANCOMYCIN  <=0.5 SENSI... Sensitive           Susceptibility Comments  Staphylococcus saprophyticus  RARE STAPHYLOCOCCUS SAPROPHYTICUS     Aerobic/Anaerobic Culture w Gram Stain (surgical/deep wound) Order: [id number]  Status: Final result     Next appt: 07/26/2024 at 10:00 AM in Podiatry [facility] CHRISTELLA Sar, [place])   Test Result Released: Yes (not seen)   Specimen Information: Abscess  0 Result Notes  important suggestion  Newer results are available. Click to view them now.      Component Ref Range & Units (hover) 7 d ago  Specimen Description ABSCESS Performed at  [facility], [address] [place], [place] [address]  Special Requests NONE Performed at [facility], [address] [place], [place] [address]  Gram Stain RARE WBC PRESENT, PREDOMINANTLY PMN NO ORGANISMS SEEN  Culture RARE ENTEROCOCCUS FAECALIS VANCOMYCIN  RESISTANT ENTEROCOCCUS ISOLATED NO ANAEROBES ISOLATED Performed at [facility], [address] [place], [place] [address]  Report Status 07/13/2024 FINAL  Organism ID, Bacteria ENTEROCOCCUS FAECALIS  Resulting Agency [facility]     Susceptibility   Enterococcus faecalis    MIC    AMPICILLIN <=2 SENSITIVE Sensitive    GENTAMICIN  SYNERGY RESISTANT Resistant    VANCOMYCIN  >=32 RESIST... Resistant           Susceptibility Comments  Enterococcus faecalis  RARE ENTEROCOCCUS FAECALIS     Specimen Collected: 07/08/24 15:34    RT third toe 09/02/2024  Assessment: POV # 1 DOS 07/08/24 RT 3RD TO DEBRIDEMENT OF ULCER W/ GRAFT APPLICATION, INCISION AND DRAINAGE LEFT FOOT, BONE BIOPSY 1ST METATARSAL LEFT.    Plan of Care:  -Patient was evaluated.  -Medically necessary excisional debridement including subcutaneous tissue was performed to the right third toe ulcer.  Excisional debridement of the necrotic nonviable tissue down to healthier bleeding  viable tissue was performed with postdebridement measurement same as pre- -Prisma was provided and applied to the wound.  Apply daily under occlusion with a Band-Aid -Continue treatment with ID.  Last seen 08/02/2024.  High-dose oral amoxicllin   preferred over intravenous treatment. Prescribe 1 gram orally three times daily  for 2 weeks and evaluate Doxy can be stopped as he has been on it since 9/5 -Continue WBAT surgical shoes bilateral -Return to clinic 3 weeks  Thresa EMERSON Sar, DPM [facility]  Dr. Thresa EMERSON Sar, DPM    [address] [place], [place] [address]                Office [phone]  Fax [phone]

## 2024-09-05 NOTE — Telephone Encounter (Signed)
 N/a

## 2024-09-13 ENCOUNTER — Encounter: Admitting: Podiatry

## 2024-09-20 ENCOUNTER — Ambulatory Visit: Admitting: Infectious Diseases

## 2024-09-23 ENCOUNTER — Encounter: Payer: Self-pay | Admitting: Podiatry

## 2024-09-23 ENCOUNTER — Ambulatory Visit: Admitting: Podiatry

## 2024-09-23 VITALS — Ht 70.0 in | Wt 295.0 lb

## 2024-09-23 DIAGNOSIS — E08621 Diabetes mellitus due to underlying condition with foot ulcer: Secondary | ICD-10-CM

## 2024-09-23 DIAGNOSIS — M86472 Chronic osteomyelitis with draining sinus, left ankle and foot: Secondary | ICD-10-CM | POA: Diagnosis not present

## 2024-09-23 DIAGNOSIS — L97512 Non-pressure chronic ulcer of other part of right foot with fat layer exposed: Secondary | ICD-10-CM | POA: Diagnosis not present

## 2024-09-25 NOTE — Progress Notes (Signed)
 Chief Complaint  Patient presents with   Diabetic Ulcer    Pt is here to f/u on right foot due to diabetic ulcer.    Subjective:  Patient presents today status post B/L foot surgery.  DOS: 07/08/2024.  Past Medical History:  Diagnosis Date   Allergies    Anginal pain    Aortic atherosclerosis    Bilateral carotid artery disease    Breast pain, left 08/29/2022   CAD (coronary artery disease) 07/2007   a.) s/p NSTEMI 07/2007 - sig multi-vessel CAD not amenable to PCI; b.) LHC 06/11/2016: multi vessel CAD not amenable to PCI --> refer to CVTS; c.) s/p 4v CABG 06/27/2016; d.) LHC 06/09/2023: 75% mLM, 80% o-pLAD, 60% mLAD, 100% p-mLCx, 100% OM1, 95% pRCA, 100% mRCA, 50% m-dLAD. LIMA-LAD and SVG-OM1 patent. CTO SVG-RCA with L-R collaterals - med mgmt   Carpal tunnel syndrome on left    a.) s/p release 04/2016   Cerebral microvascular disease    CKD (chronic kidney disease), stage III (HCC)    COVID-19 12/09/2022   Diabetic peripheral neuropathy (HCC)    Diastolic dysfunction    a.) TTE 06/25/2016: EF 60-654%. no RWMAs, G1DD, mild AoV annular calc; b.) TTE 03/19/2017: EF 50-55%, no RWMAs, mild MR; c.) TTE 07/22/2019: EF 55-60%, midl LVH, no RWMAs, triv MR, mild TR   Dizziness 04/30/2021   Dyspnea    GERD (gastroesophageal reflux disease)    Glaucoma    Headache    History of bilateral cataract extraction    HLD (hyperlipidemia)    HTN (hypertension)    Long term current use of clopidogrel     Long-term use of aspirin  therapy    MRSA (methicillin resistant staph aureus) culture positive    2006 - 2008   NSTEMI (non-ST elevated myocardial infarction) (HCC) 07/27/2007   a.) LHC 07/30/2007: 30% mLAD, 99% D1, 50% pLCx, 40% mLCx, 70/75/85% dLCx, 50% OM1, 75/80/80 OM2, 50% pRCA, 60% mRCA, 85% dRCA --> not amenable to PCI   OSA on CPAP    Osteoarthritis    Osteomyelitis of left foot (HCC)    PAD (peripheral artery disease)    a. 03/2022 Lower Ext Angio: No signif Ao-iliac dzs. R PT diff  dzs and occluded above ankle. R Pedal arch intact w/ excellent antegrade flow provided by R AT-->no indication for revasc.   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   a.) LIMA-LAD, SVG-RCA, seq SVG-OM1-OM2   S/P tube myringotomy    Skin cancer, basal cell    Skin ulcer of right great toe, unspecified ulcer stage (HCC)    T1DM (type 1 diabetes mellitus) (HCC)    Wears glasses    Wears partial dentures    top    Past Surgical History:  Procedure Laterality Date   ABDOMINAL AORTOGRAM W/LOWER EXTREMITY N/A 03/26/2022   Procedure: ABDOMINAL AORTOGRAM W/LOWER EXTREMITY;  Surgeon: Darron Deatrice LABOR, MD;  [facility]: [facility];  Service: Cardiovascular;  Laterality: N/A;   AMPUTATION TOE Bilateral 06/26/2023   Procedure: AMPUTATION TOE MPJ OF THE JOINT,PARTIAL TOE AMPUTUATION;  Surgeon: Janit Thresa HERO, DPM;  Location: [facility];  Service: Orthopedics/Podiatry;  Laterality: Bilateral;   AMPUTATION TOE Right 12/04/2023   Procedure: AMPUTATION TOE INTERPHALANGEAL 3RD;  Surgeon: Janit Thresa HERO, DPM;  Location: [facility];  Service: Orthopedics/Podiatry;  Laterality: Right;   APPENDECTOMY     CARDIAC CATHETERIZATION N/A 06/11/2016   Procedure: Left Heart Cath and  Coronary Angiography;  Surgeon: Wolm JINNY Rhyme, MD;  Location: [facility];  Service: Cardiovascular;  Laterality: N/A;   CARPAL TUNNEL RELEASE Left 04/24/2016   Procedure: CARPAL TUNNEL RELEASE;  Surgeon: Ozell Flake, MD;  Location: [facility];  Service: Orthopedics;  Laterality: Left;   CATARACT EXTRACTION W/ INTRAOCULAR LENS IMPLANT Right 07/16/2012   CATARACT EXTRACTION W/ INTRAOCULAR LENS IMPLANT Left 08/13/2012   CORONARY ARTERY BYPASS GRAFT N/A 06/27/2016   Procedure: CORONARY ARTERY BYPASS GRAFTING times four using left internal mammary artery and right leg saphenous vein;  Surgeon: Maude Fleeta Ochoa, MD;  Location: [facility];  Service: Open Heart Surgery;  Laterality: N/A;    FOREARM FRACTURE SURGERY Left 1961   IRRIGATION AND DEBRIDEMENT ABSCESS Right 07/08/2024   Procedure: IRRIGATION AND DEBRIDEMENT ULCER, WITH GRAFT APPLICATION RIGHT THIRD TOE;  Surgeon: Janit Thresa HERO, DPM;  Location: [facility];  Service: Orthopedics/Podiatry;  Laterality: Right;   IRRIGATION AND DEBRIDEMENT FOOT Left 07/08/2024   Procedure: IRRIGATION AND DEBRIDEMENT FOOT AMPUTATION STUMB, BONE BIOPSY FIRST METATARSAL;  Surgeon: Janit Thresa HERO, DPM;  Location: [facility];  Service: Orthopedics/Podiatry;  Laterality: Left;   KNEE ARTHROPLASTY Left 02/11/2016   Procedure: COMPUTER ASSISTED TOTAL KNEE ARTHROPLASTY;  Surgeon: Lynwood SHAUNNA Hue, MD;  Location: [facility];  Service: Orthopedics;  Laterality: Left;   KNEE ARTHROSCOPY Right 2000   LEFT HEART CATH AND CORONARY ANGIOGRAPHY Left 07/30/2007   Procedure: LEFT HEART CATH AND CORONARY ANGIOGRAPHY; Location: [facility]; Surgeon: Wolm Rhyme, MD   LEFT HEART CATH AND CORS/GRAFTS ANGIOGRAPHY Left 01/13/2017   Procedure: Left Heart Cath and Cors/Grafts Angiography;  Surgeon: Wolm JINNY Rhyme, MD;  Location: [facility];  Service: Cardiovascular;  Laterality: Left;   LEFT HEART CATH AND CORS/GRAFTS ANGIOGRAPHY Left 06/09/2023   Procedure: LEFT HEART CATH AND CORS/GRAFTS ANGIOGRAPHY;  Surgeon: Ammon Blunt, MD;  Location: [facility];  Service: Cardiovascular;  Laterality: Left;   LOWER EXTREMITY ANGIOGRAPHY Left 02/23/2024   Procedure: Lower Extremity Angiography;  Surgeon: Jama Cordella MATSU, MD;  Location: [facility];  Service: Cardiovascular;  Laterality: Left;   LOWER EXTREMITY ANGIOGRAPHY Right 05/03/2024   Procedure: Lower Extremity Angiography;  Surgeon: Jama Cordella MATSU, MD;  Location: [facility];  Service: Cardiovascular;  Laterality: Right;   LOWER EXTREMITY INTERVENTION Left 02/23/2024   Procedure: LOWER EXTREMITY INTERVENTION;  Surgeon: Jama Cordella MATSU, MD;  Location: [facility];  Service:  Cardiovascular;  Laterality: Left;   METATARSAL HEAD EXCISION Right 03/21/2020   Procedure: METATARSAL HEAD RESECTION RIGHT AND DEBRIDEMENT OF ULCER;  Surgeon: Janit Thresa HERO, DPM;  Location[facility];  Service: Podiatry;  Laterality: Right;   METATARSAL HEAD EXCISION Left 09/15/2023   Procedure: METATARSAL HEAD EXCISION;  Surgeon: Janit Thresa HERO, DPM;  Location: [facility];  Service: Orthopedics/Podiatry;  Laterality: Left;   MULTIPLE TOOTH EXTRACTIONS     TEE WITHOUT CARDIOVERSION N/A 06/27/2016   Procedure: TRANSESOPHAGEAL ECHOCARDIOGRAM (TEE);  Surgeon: Maude Fleeta Ochoa, MD;  [name];  Service: Open Heart Surgery;  Laterality: N/A;   TENDON LENGTHENING Left 12/04/2023   Procedure: TENDON-ACHILLES LENGTHENING;  Surgeon: Janit Thresa HERO, DPM;  Location: [facility];  Service: Orthopedics/Podiatry;  Laterality: Left;   TRANSMETATARSAL AMPUTATION Left 12/04/2023   Procedure: TRANSMETATARSAL AMPUTATION;  Surgeon: Janit Thresa HERO, DPM;  Location: [facility];  Service: Orthopedics/Podiatry;  Laterality: Left;    Allergies  Allergen Reactions   Latex Other (See Comments)    Blister and burning (3M insulin  pump  latex band aid )   Nitroglycerin  Nausea Only and Other (See Comments)    Patches only - headache   Statins Other (See Comments)    BODY PAIN. Taking rosuvastatin  in the evenings and tolerating.    Fluvastatin Other (See Comments)    Muscle pain   Fluvastatin Sodium Other (See Comments)    Myalgias   Percocet [Oxycodone -Acetaminophen ] Itching   Rosuvastatin  Other (See Comments)    Myalgias. Taking in evening and tolerating   Simvastatin Other (See Comments)    Muscle pain, Myalgias   Lipitor [Atorvastatin] Other (See Comments) and Rash    BODY PAIN   Lisinopril  Nausea Only and Other (See Comments)    Other reaction(s): Dizziness   Zetia [Ezetimibe] Other (See Comments)    Muscle Pain   Objective/Physical Exam Ulcer noted to the distal tip of the right third toe stable.  Intermixed  fibrotic and granular tissue noted.  The wound measures approximately 1.2 x 1.2 x 0.3 cm.  To the noted ulceration there is no eschar.  Moderate slough fibrin and necrotic tissue noted to the wound base.  No drainage.  No malodor.  The draining sinus to the left foot appears significantly improved.  It is very dry and stable.  It does not probe to the deeper tissue.  It measures approximately 0.4 x 0.1 x 0.2 cm.  Please see attached photos  MR FOOT LEFT WO CONTRAST 06/06/2024 IMPRESSION: 1. Marrow edema in the distal 3 cm of the first metatarsal remnant is worrisome for osteomyelitis. Minimal marrow edema in the distal 1.5-2 cm of the second and third metatarsal remnants is nonspecific and could be reactive or due to early osteomyelitis. 2. Fluid collection anterior and subjacent to the first metatarsal remnant is worrisome for abscess. 3. Diffuse subcutaneous edema about the imaged foot could be due to cellulitis or dependent change.  Aerobic/Anaerobic Culture w Gram Stain (surgical/deep wound) Order: [id number]  Status: Final result     Next appt: 07/26/2024 at 10:00 AM in Podiatry [facility] CHRISTELLA Sar, [place])   Test Result Released: Yes (not seen)   Specimen Information: Path Tissue  0 Result Notes    Component Ref Range & Units (hover) 7 d ago  Specimen Description TISSUE Performed at [facility], [address] [place], [place] [address]  Special Requests NONE Performed at [facility], [address] [place], [place] [address]  Gram Stain NO WBC SEEN NO ORGANISMS SEEN  Culture RARE STAPHYLOCOCCUS SAPROPHYTICUS NO ANAEROBES ISOLATED Performed at [facility], [address] [place], [place] [address]  Report Status 07/13/2024 FINAL  Organism ID, Bacteria STAPHYLOCOCCUS SAPROPHYTICUS  Resulting Agency [facility]     Susceptibility   Staphylococcus saprophyticus    MIC    CIPROFLOXACIN  <=0.5 SENSI... Sensitive    CLINDAMYCIN >=8 RESISTANT Resistant     ERYTHROMYCIN >=8 RESISTANT Resistant    GENTAMICIN  <=0.5 SENSI... Sensitive    Inducible Clindamycin NEGATIVE Sensitive    OXACILLIN 2 RESISTANT Resistant    RIFAMPIN <=0.5 SENSI... Sensitive    TETRACYCLINE <=1 SENSITIVE Sensitive    TRIMETH /SULFA  <=10 SENSIT... Sensitive    VANCOMYCIN  <=0.5 SENSI... Sensitive           Susceptibility Comments  Staphylococcus saprophyticus  RARE STAPHYLOCOCCUS SAPROPHYTICUS     Aerobic/Anaerobic Culture w Gram Stain (surgical/deep wound) Order: [id number]  Status: Final result     Next appt: 07/26/2024 at 10:00 AM in Podiatry [place] CHRISTELLA Sar, [place])   Test Result Released:  Yes (not seen)   Specimen Information: Abscess  0 Result Notes  important suggestion  Newer results are available. Click to view them now.      Component Ref Range & Units (hover) 7 d ago  Specimen Description ABSCESS Performed at [facility], [address] [place], [place] [address]  Special Requests NONE Performed at [facility], [address] [place], [place] [address]  Gram Stain RARE WBC PRESENT, PREDOMINANTLY PMN NO ORGANISMS SEEN  Culture RARE ENTEROCOCCUS FAECALIS VANCOMYCIN  RESISTANT ENTEROCOCCUS ISOLATED NO ANAEROBES ISOLATED Performed at [facility], [address] [place], [place] [address]  Report Status 07/13/2024 FINAL  Organism ID, Bacteria ENTEROCOCCUS FAECALIS  Resulting Agency [facility]     Susceptibility   Enterococcus faecalis    MIC    AMPICILLIN <=2 SENSITIVE Sensitive    GENTAMICIN  SYNERGY RESISTANT Resistant    VANCOMYCIN  >=32 RESIST... Resistant           Susceptibility Comments  Enterococcus faecalis  RARE ENTEROCOCCUS FAECALIS     Specimen Collected: 07/08/24 15:34    RT third toe 09/23/2024  LT foot 09/23/2024  Assessment: POV # 1 DOS 07/08/24 RT 3RD TO DEBRIDEMENT OF ULCER W/ GRAFT APPLICATION, INCISION AND DRAINAGE LEFT FOOT, BONE BIOPSY 1ST METATARSAL LEFT.   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  -Medically necessary excisional debridement including subcutaneous tissue was performed to the right third toe ulcer.  Excisional debridement of the necrotic nonviable tissue down to healthier bleeding viable tissue was performed with postdebridement measurement same as pre- -unfortunately the patient continues to have the persistent ulcer to the right foot.  It has failed several months of conservative routine debridements and care.  The best success that he had with the ulcer was when the graft was applied.  Today we will initiate preauthorization to apply graft to the right third toe.  Medically necessary to facilitate wound healing and prevent further hospitalizations or amputations -In regards to the left foot draining sinus, overall significant proved.  Currently there is no drainage coming from the area. -Continue management with infectious disease.  Last seen 08/18/2024 : ID plan of care - You are here for follow up of the left foot infection and rt 3rd toe infection. You are on 1 gram Amoxicillin  three times a day Will reduce the amoxicillin  to 500mg  three times a day.Follow up 1 month -Return to clinic 3 weeks  Thresa EMERSON Sar, DPM [facility]  Dr. Thresa EMERSON Sar, DPM    [address] [place], [place] [address]                Office [phone]  Fax [phone]

## 2024-09-27 ENCOUNTER — Ambulatory Visit: Attending: Infectious Diseases | Admitting: Infectious Diseases

## 2024-09-27 ENCOUNTER — Encounter: Payer: Self-pay | Admitting: Infectious Diseases

## 2024-09-27 VITALS — BP 139/68 | HR 71 | Temp 96.0°F | Ht 70.0 in | Wt 284.0 lb

## 2024-09-27 DIAGNOSIS — G43809 Other migraine, not intractable, without status migrainosus: Secondary | ICD-10-CM | POA: Insufficient documentation

## 2024-09-27 DIAGNOSIS — E1140 Type 2 diabetes mellitus with diabetic neuropathy, unspecified: Secondary | ICD-10-CM | POA: Insufficient documentation

## 2024-09-27 DIAGNOSIS — Z79899 Other long term (current) drug therapy: Secondary | ICD-10-CM | POA: Diagnosis not present

## 2024-09-27 DIAGNOSIS — E11621 Type 2 diabetes mellitus with foot ulcer: Secondary | ICD-10-CM | POA: Insufficient documentation

## 2024-09-27 DIAGNOSIS — Z951 Presence of aortocoronary bypass graft: Secondary | ICD-10-CM | POA: Diagnosis not present

## 2024-09-27 DIAGNOSIS — Z89422 Acquired absence of other left toe(s): Secondary | ICD-10-CM | POA: Diagnosis not present

## 2024-09-27 DIAGNOSIS — I2510 Atherosclerotic heart disease of native coronary artery without angina pectoris: Secondary | ICD-10-CM | POA: Diagnosis not present

## 2024-09-27 DIAGNOSIS — Z89412 Acquired absence of left great toe: Secondary | ICD-10-CM | POA: Diagnosis not present

## 2024-09-27 DIAGNOSIS — Z7902 Long term (current) use of antithrombotics/antiplatelets: Secondary | ICD-10-CM | POA: Diagnosis not present

## 2024-09-27 DIAGNOSIS — M86672 Other chronic osteomyelitis, left ankle and foot: Secondary | ICD-10-CM | POA: Diagnosis not present

## 2024-09-27 DIAGNOSIS — E1169 Type 2 diabetes mellitus with other specified complication: Secondary | ICD-10-CM | POA: Diagnosis not present

## 2024-09-27 DIAGNOSIS — Z96652 Presence of left artificial knee joint: Secondary | ICD-10-CM | POA: Insufficient documentation

## 2024-09-27 DIAGNOSIS — Z794 Long term (current) use of insulin: Secondary | ICD-10-CM | POA: Insufficient documentation

## 2024-09-27 DIAGNOSIS — Z89421 Acquired absence of other right toe(s): Secondary | ICD-10-CM | POA: Diagnosis not present

## 2024-09-27 DIAGNOSIS — L089 Local infection of the skin and subcutaneous tissue, unspecified: Secondary | ICD-10-CM

## 2024-09-27 DIAGNOSIS — E1142 Type 2 diabetes mellitus with diabetic polyneuropathy: Secondary | ICD-10-CM

## 2024-09-27 DIAGNOSIS — L97518 Non-pressure chronic ulcer of other part of right foot with other specified severity: Secondary | ICD-10-CM | POA: Insufficient documentation

## 2024-09-27 DIAGNOSIS — L97528 Non-pressure chronic ulcer of other part of left foot with other specified severity: Secondary | ICD-10-CM | POA: Diagnosis present

## 2024-09-27 NOTE — Patient Instructions (Addendum)
 You are here for follow up of rt foot infection- the toe still has the wound but no infection- the left foot ulcer has healed well- please follow with Dr.Evans for the rt toe graft- no antibiotic needed now. Follow up with me as needed

## 2024-09-27 NOTE — Progress Notes (Signed)
 NAME: Lawrence [name]  DOB: 1951/10/16  MRN: [id number]  Date/Time: 09/27/2024 11:15 AM   Subjective:  Follow up visit for foot infection. Here with his wife- wife had a fall and had a big hematoma last week and hence patient had to cancel the appt with me and take her to the ED Last seen on 08/18/24 Left foot chronic wound with VRE - took 6 weeks of  amoxicillin  1 gram TID  Completed 10 days ago  The wound has healed completely Saw Dr.Evans for rt third toe ulcer which has not closed and Dr.Evans planning a graft sometime   Lawrence [name] is a 73 year old male with coronary artery disease, s/p CABG, diabetes, and peripheral artery disease, Left TMA, rt multiple toes amputation . Vestibular migraine Left TKA, chronic wounds both feet He underwent a procedure on September 5th, where  left foot abscess was debrided and a graft was prepared for a right third toe ulcer. Cultures from the left foot revealed vancomycin -resistant Enterococcus faecalis sensitive to ampicillin. The bone biopsy was performed and pathology results were chronic inactive osteomyelitis   In 2018, he lost his left great toe due to infection, and in January of this year, he underwent a transmetatarsal amputation, losing all toes on the left foot. He currently has two and a half toes remaining on the right foot. He has not previously required a PICC line or intravenous antibiotics.  He has no known allergies to antibiotics and takes multiple medications including zonisamide for vestibular migraines, erenumab for migraines, and various medications for his heart and diabetes.  He has a history of coronary artery disease with a quad bypass in 2017, a heart attack in 2018, and a full left knee replacement in 2017. He also experiences chronic vestibular migraines, which have been partially controlled with medication.  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 - Vestibular migraine - Hypertension - Hyperlipidemia - Benign prostatic hyperplasia  Surgical History: - Coronary artery bypass grafting (August 2017): Quadruple bypass - Left knee replacement (April 2017): Full replacement - Transmetatarsal amputation (December 03, 2021): Removal of all toes on the left foot - Great toe amputation (March or April 2018): Amputation of the great toe on the left foot - Debridement of left foot abscess and grafting of right third toe ulcer (July 08, 2022): Debridement and grafting procedure   Social History   Socioeconomic History   Marital status: Married    Spouse name: Hirschi,Lajuana   Number of children: Not on file   Years of education: Not on file   Highest education level: Not on file  Occupational History   Not on file  Tobacco Use   Smoking status: Former    Current packs/day: 0.00    Average packs/day: 1 pack/day for 16.7 years (16.7 ttl pk-yrs)    Types: Cigarettes    Start date: 11/03/1969    Quit date: 07/22/1986    Years since quitting: 38.2   Smokeless tobacco: Never  Vaping Use   Vaping status: Never Used  Substance and Sexual Activity   Alcohol use: Not Currently   Drug use: No   Sexual activity: Never  Other Topics Concern   Not on file  Social History Narrative   Married.  Lives at home with wife.    Social Drivers of Health   Financial Resource Strain: Low Risk  (06/16/2024)   Received from [facility]   Overall Financial Resource Strain (CARDIA)    Difficulty of Paying Living Expenses: Not hard at all  Food Insecurity: No Food Insecurity (06/16/2024)   Received from [facility]   Hunger Vital Sign    Within the past 12 months, you worried that your food would run out before you got the money to buy more.: Never true    Within the past 12 months, the food you bought just didn't last and you didn't have money to get more.: Never true  Transportation  Needs: No Transportation Needs (06/16/2024)   Received from [facility]    In the past 12 months, has lack of transportation kept you from medical appointments or from getting medications?: No    Lack of Transportation (Non-Medical): No  Physical Activity: Inactive (10/20/2023)   Exercise Vital Sign    Days of Exercise per Week: 0 days    Minutes of Exercise per Session: 0 min  Stress: No Stress Concern Present (10/20/2023)   [employer] - Occupational Stress Questionnaire    Feeling of Stress : Only a little  Social Connections: Moderately Integrated (10/20/2023)   Social Connection and Isolation Panel    Frequency of Communication with Friends and Family: More than three times a week    Frequency of Social Gatherings with Friends and Family: Twice a week    Attends Religious Services: 1 to 4 times per year    Active Member of [employer] or Organizations: No    Attends Banker Meetings: Never    Marital Status: Married  Catering Manager Violence: Not At Risk (10/20/2023)   Humiliation, Afraid, Rape, and Kick questionnaire    Fear of Current or Ex-Partner: No    Emotionally Abused: No    Physically Abused: No    Sexually Abused: No    Family History  Problem Relation Age of Onset   Hypertension Mother    Lupus Mother    Heart Problems Mother    Hypertension Father    Diabetes Father    Heart attack Father 77       died in his 23s   Drug abuse Brother    Heart disease Brother    Heart attack Maternal Grandmother    Arthritis Maternal Grandmother    Arthritis Maternal Grandfather    Heart attack Paternal Grandmother    Arthritis Paternal Grandmother    Arthritis Paternal Grandfather    Allergies  Allergen Reactions   Latex Other (See Comments)    Blister and burning (60M insulin  pump latex band aid )   Nitroglycerin  Nausea Only and Other (See Comments)    Patches only - headache   Statins  Other (See Comments)    BODY PAIN. Taking rosuvastatin  in the evenings and tolerating.    Fluvastatin Other (See Comments)    Muscle pain   Fluvastatin Sodium Other (See Comments)    Myalgias   Percocet [Oxycodone -Acetaminophen ] Itching   Rosuvastatin  Other (See Comments)    Myalgias. Taking in evening and tolerating   Simvastatin Other (See Comments)    Muscle pain, Myalgias   Lipitor [Atorvastatin] Other (See Comments) and Rash    BODY PAIN   Lisinopril  Nausea Only and Other (See Comments)    Other reaction(s): Dizziness   Zetia [Ezetimibe] Other (See Comments)    Muscle Pain  Meds I?- Doxycycline  - Zonisamide - Aimovig - Vitamin C  - Aspirin  - Calcium  - Lactulose  -  Carvedilol - Vitamin D3 - Clopidogrel  -- Furosemide  - Famotidine  - Insulin  - Isosorbide  - Loratadine  - Nitroglycerin  as needed - Oxycodone  - Rosuvastatin  - Flonase  Current Outpatient Medications  Medication Sig Dispense Refill   acetaminophen  (TYLENOL ) 500 MG tablet Take 1,000 mg by mouth every 6 (six) hours as needed for moderate pain (pain score 4-6).     AIMOVIG 140 MG/ML SOAJ Inject 140 mg into the skin every 30 (thirty) days.     albuterol  (VENTOLIN  HFA) 108 (90 Base) MCG/ACT inhaler Inhale 2 puffs into the lungs every 4 (four) hours as needed for wheezing. 1 each 0   amLODipine (NORVASC) 2.5 MG tablet Take 2.5 mg by mouth daily.     amoxicillin  (AMOXIL ) 500 MG capsule Take 2 capsules (1,000 mg total) by mouth 3 (three) times daily. 90 capsule 1   aspirin  81 MG EC tablet Take 81 mg by mouth daily. Swallow whole.     Atogepant (QULIPTA) 60 MG TABS Take 60 mg by mouth 1 day or 1 dose.     azelastine (ASTELIN) 0.1 % nasal spray Place 2 sprays into both nostrils as needed for rhinitis. Use in each nostril as directed     Calcium  Carb-Cholecalciferol  (CALCIUM  + D3 PO) Take 1 tablet by mouth in the morning and at bedtime.     carvedilol (COREG) 12.5 MG tablet Take 12.5 mg by mouth 2 (two) times daily with  a meal. (Patient taking differently: Take 12.5 mg by mouth 2 (two) times daily with a meal. Taking half a tab as of 08/16/24)     Cholecalciferol  (VITAMIN D ) 50 MCG (2000 UT) tablet Take 2,000 Units by mouth in the morning and at bedtime.     clopidogrel  (PLAVIX ) 75 MG tablet Take 1 tablet (75 mg total) by mouth daily. 30 tablet 0   famotidine  (ACID CONTROLLER) 10 MG tablet Take 10 mg by mouth in the morning.     furosemide  (LASIX ) 40 MG tablet Take 1 tablet (40 mg total) by mouth daily. 30 tablet 1   glucagon 1 MG injection Inject 1 mg into the skin once as needed.     GLUTOSE 15 40 % GEL Take 1 Tube by mouth once as needed for low blood sugar.     insulin  aspart (NOVOLOG ) 100 UNIT/ML injection Inject 20 Units into the skin 3 (three) times daily. (Patient taking differently: Inject 10-20 Units into the skin 3 (three) times daily with meals. Sliding Scale will go up to 25 units for BG > 300 - 400) 10 mL 5   insulin  glargine (LANTUS  SOLOSTAR) 100 UNIT/ML Solostar Pen 20 Units.     insulin  glargine-yfgn (SEMGLEE ) 100 UNIT/ML injection Inject 20 Units into the skin at bedtime.     isosorbide  mononitrate (IMDUR ) 60 MG 24 hr tablet Take 60 mg by mouth in the morning and at bedtime.     loratadine  (CLARITIN ) 10 MG tablet Take 10 mg by mouth in the morning.     nitroGLYCERIN  (NITROSTAT ) 0.4 MG SL tablet DISSOLVE 1 TABLET UNDER THE TONGUE EVERY 5 MINUTES AS NEEDED FOR CHEST PAIN 30 tablet 0   potassium chloride  SA (K-DUR,KLOR-CON ) 20 MEQ tablet Take 1 tablet (20 mEq total) by mouth daily. 30 tablet 1   rosuvastatin  (CRESTOR ) 20 MG tablet Take 1 tablet (20 mg total) by mouth daily at 6 PM. 30 tablet 1   Travoprost, BAK Free, (TRAVATAN) 0.004 % SOLN ophthalmic solution Place 1 drop into both eyes at bedtime.  vitamin C  (ASCORBIC ACID ) 500 MG tablet Take 500 mg by mouth 2 (two) times daily.      zonisamide (ZONEGRAN) 100 MG capsule Take 3 capsules (300 mg total) by mouth once daily .     No current  facility-administered medications for this visit.     Abtx:  Anti-infectives (From admission, onward)    None       REVIEW OF SYSTEMS:  Const: negative fever, negative chills, negative weight loss Eyes: negative diplopia or visual changes, negative eye pain ENT: negative coryza, negative sore throat Resp: negative cough, hemoptysis, dyspnea Cards: negative for chest pain, palpitations, lower extremity edema GU: negative for frequency, dysuria and hematuria GI: Negative for abdominal pain, diarrhea, bleeding, constipation Skin: negative for rash and pruritus Heme: negative for easy bruising and gum/nose bleeding FD:qzzopwh okay Left foot wound healed Neurolo: headaches, Psych: negative for feelings of anxiety, depression  Endocrine: , diabetes Allergy/Immunology- as above - Objective:  VITALS:  BP 139/68   Pulse 71   Temp (!) 96 F (35.6 C) (Temporal)   Ht 5' 10 (1.778 m)   Wt 284 lb (128.8 kg)   SpO2 96%   BMI 40.75 kg/m   PHYSICAL EXAM:  General: Alert, cooperative, no distress, appears stated age.  Lungs: Clear to auscultation bilaterally. No Wheezing or Rhonchi. No rales. Heart: Regular rate and rhythm, no murmur, rub or gallop. Abdomen: Soft, non-tender,not distended. Bowel sounds normal. No masses Extremities:   09/27/24- left foot wound has healed      08/18/24      08/02/24       Skin: No rashes or lesions. Or bruising Lymph: Cervical, supraclavicular normal. 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 Left TKA  ____________________________________ Discussed with patient and wife Discussed with Dr.Evans He is discharged from my clinic Follow as needed

## 2024-10-11 ENCOUNTER — Ambulatory Visit: Admitting: Podiatry

## 2024-10-11 ENCOUNTER — Encounter: Payer: Self-pay | Admitting: Podiatry

## 2024-10-11 VITALS — Ht 70.0 in | Wt 284.0 lb

## 2024-10-11 DIAGNOSIS — E08621 Diabetes mellitus due to underlying condition with foot ulcer: Secondary | ICD-10-CM | POA: Diagnosis not present

## 2024-10-11 DIAGNOSIS — L97512 Non-pressure chronic ulcer of other part of right foot with fat layer exposed: Secondary | ICD-10-CM | POA: Diagnosis not present

## 2024-10-11 NOTE — Progress Notes (Signed)
 Chief Complaint  Patient presents with   Diabetic Ulcer    Pt is here to f/u on right foot due to diabetic ulcer.    Subjective:  Patient presents today status post B/L foot surgery.  DOS: 07/08/2024.  Past Medical History:  Diagnosis Date   Allergies    Anginal pain    Aortic atherosclerosis    Bilateral carotid artery disease    Breast pain, left 08/29/2022   CAD (coronary artery disease) 07/2007   a.) s/p NSTEMI 07/2007 - sig multi-vessel CAD not amenable to PCI; b.) LHC 06/11/2016: multi vessel CAD not amenable to PCI --> refer to CVTS; c.) s/p 4v CABG 06/27/2016; d.) LHC 06/09/2023: 75% mLM, 80% o-pLAD, 60% mLAD, 100% p-mLCx, 100% OM1, 95% pRCA, 100% mRCA, 50% m-dLAD. LIMA-LAD and SVG-OM1 patent. CTO SVG-RCA with L-R collaterals - med mgmt   Carpal tunnel syndrome on left    a.) s/p release 04/2016   Cerebral microvascular disease    CKD (chronic kidney disease), stage III (HCC)    COVID-19 12/09/2022   Diabetic peripheral neuropathy (HCC)    Diastolic dysfunction    a.) TTE 06/25/2016: EF 60-654%. no RWMAs, G1DD, mild AoV annular calc; b.) TTE 03/19/2017: EF 50-55%, no RWMAs, mild MR; c.) TTE 07/22/2019: EF 55-60%, midl LVH, no RWMAs, triv MR, mild TR   Dizziness 04/30/2021   Dyspnea    GERD (gastroesophageal reflux disease)    Glaucoma    Headache    History of bilateral cataract extraction    HLD (hyperlipidemia)    HTN (hypertension)    Long term current use of clopidogrel     Long-term use of aspirin  therapy    MRSA (methicillin resistant staph aureus) culture positive    2006 - 2008   NSTEMI (non-ST elevated myocardial infarction) (HCC) 07/27/2007   a.) LHC 07/30/2007: 30% mLAD, 99% D1, 50% pLCx, 40% mLCx, 70/75/85% dLCx, 50% OM1, 75/80/80 OM2, 50% pRCA, 60% mRCA, 85% dRCA --> not amenable to PCI   OSA on CPAP    Osteoarthritis    Osteomyelitis of left foot (HCC)    PAD (peripheral artery disease)    a. 03/2022 Lower Ext Angio: No signif Ao-iliac dzs. R PT diff  dzs and occluded above ankle. R Pedal arch intact w/ excellent antegrade flow provided by R AT-->no indication for revasc.   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   a.) LIMA-LAD, SVG-RCA, seq SVG-OM1-OM2   S/P tube myringotomy    Skin cancer, basal cell    Skin ulcer of right great toe, unspecified ulcer stage (HCC)    T1DM (type 1 diabetes mellitus) (HCC)    Wears glasses    Wears partial dentures    top    Past Surgical History:  Procedure Laterality Date   ABDOMINAL AORTOGRAM W/LOWER EXTREMITY N/A 03/26/2022   Procedure: ABDOMINAL AORTOGRAM W/LOWER EXTREMITY;  Surgeon: Darron Deatrice LABOR, MD;  [facility]: [facility];  Service: Cardiovascular;  Laterality: N/A;   AMPUTATION TOE Bilateral 06/26/2023   Procedure: AMPUTATION TOE MPJ OF THE JOINT,PARTIAL TOE AMPUTUATION;  Surgeon: Janit Thresa HERO, DPM;  Location: [facility];  Service: Orthopedics/Podiatry;  Laterality: Bilateral;   AMPUTATION TOE Right 12/04/2023   Procedure: AMPUTATION TOE INTERPHALANGEAL 3RD;  Surgeon: Janit Thresa HERO, DPM;  Location: [facility];  Service: Orthopedics/Podiatry;  Laterality: Right;   APPENDECTOMY     CARDIAC CATHETERIZATION N/A 06/11/2016   Procedure: Left Heart Cath and  Coronary Angiography;  Surgeon: Wolm JINNY Rhyme, MD;  Location: [facility];  Service: Cardiovascular;  Laterality: N/A;   CARPAL TUNNEL RELEASE Left 04/24/2016   Procedure: CARPAL TUNNEL RELEASE;  Surgeon: Ozell Flake, MD;  Location: [facility];  Service: Orthopedics;  Laterality: Left;   CATARACT EXTRACTION W/ INTRAOCULAR LENS IMPLANT Right 07/16/2012   CATARACT EXTRACTION W/ INTRAOCULAR LENS IMPLANT Left 08/13/2012   CORONARY ARTERY BYPASS GRAFT N/A 06/27/2016   Procedure: CORONARY ARTERY BYPASS GRAFTING times four using left internal mammary artery and right leg saphenous vein;  Surgeon: Maude Fleeta Ochoa, MD;  Location: [facility];  Service: Open Heart Surgery;  Laterality: N/A;    FOREARM FRACTURE SURGERY Left 1961   IRRIGATION AND DEBRIDEMENT ABSCESS Right 07/08/2024   Procedure: IRRIGATION AND DEBRIDEMENT ULCER, WITH GRAFT APPLICATION RIGHT THIRD TOE;  Surgeon: Janit Thresa HERO, DPM;  Location: [facility];  Service: Orthopedics/Podiatry;  Laterality: Right;   IRRIGATION AND DEBRIDEMENT FOOT Left 07/08/2024   Procedure: IRRIGATION AND DEBRIDEMENT FOOT AMPUTATION STUMB, BONE BIOPSY FIRST METATARSAL;  Surgeon: Janit Thresa HERO, DPM;  Location: [facility];  Service: Orthopedics/Podiatry;  Laterality: Left;   KNEE ARTHROPLASTY Left 02/11/2016   Procedure: COMPUTER ASSISTED TOTAL KNEE ARTHROPLASTY;  Surgeon: Lynwood SHAUNNA Hue, MD;  Location: [facility];  Service: Orthopedics;  Laterality: Left;   KNEE ARTHROSCOPY Right 2000   LEFT HEART CATH AND CORONARY ANGIOGRAPHY Left 07/30/2007   Procedure: LEFT HEART CATH AND CORONARY ANGIOGRAPHY; Location: [facility]; Surgeon: Wolm Rhyme, MD   LEFT HEART CATH AND CORS/GRAFTS ANGIOGRAPHY Left 01/13/2017   Procedure: Left Heart Cath and Cors/Grafts Angiography;  Surgeon: Wolm JINNY Rhyme, MD;  Location: [facility];  Service: Cardiovascular;  Laterality: Left;   LEFT HEART CATH AND CORS/GRAFTS ANGIOGRAPHY Left 06/09/2023   Procedure: LEFT HEART CATH AND CORS/GRAFTS ANGIOGRAPHY;  Surgeon: Ammon Blunt, MD;  Location: [facility];  Service: Cardiovascular;  Laterality: Left;   LOWER EXTREMITY ANGIOGRAPHY Left 02/23/2024   Procedure: Lower Extremity Angiography;  Surgeon: Jama Cordella MATSU, MD;  Location: [facility];  Service: Cardiovascular;  Laterality: Left;   LOWER EXTREMITY ANGIOGRAPHY Right 05/03/2024   Procedure: Lower Extremity Angiography;  Surgeon: Jama Cordella MATSU, MD;  Location: [facility];  Service: Cardiovascular;  Laterality: Right;   LOWER EXTREMITY INTERVENTION Left 02/23/2024   Procedure: LOWER EXTREMITY INTERVENTION;  Surgeon: Jama Cordella MATSU, MD;  Location: [facility];  Service:  Cardiovascular;  Laterality: Left;   METATARSAL HEAD EXCISION Right 03/21/2020   Procedure: METATARSAL HEAD RESECTION RIGHT AND DEBRIDEMENT OF ULCER;  Surgeon: Janit Thresa HERO, DPM;  Location: [facility];  Service: Podiatry;  Laterality: Right;   METATARSAL HEAD EXCISION Left 09/15/2023   Procedure: METATARSAL HEAD EXCISION;  Surgeon: Janit Thresa HERO, DPM;  Location: [facility];  Service: Orthopedics/Podiatry;  Laterality: Left;   MULTIPLE TOOTH EXTRACTIONS     TEE WITHOUT CARDIOVERSION N/A 06/27/2016   Procedure: TRANSESOPHAGEAL ECHOCARDIOGRAM (TEE);  Surgeon: Maude Fleeta Ochoa, MD;  Location: [facility] OR;  Service: Open Heart Surgery;  Laterality: N/A;   TENDON LENGTHENING Left 12/04/2023   Procedure: TENDON-ACHILLES LENGTHENING;  Surgeon: Janit Thresa HERO, DPM;  Location: [facility];  Service: Orthopedics/Podiatry;  Laterality: Left;   TRANSMETATARSAL AMPUTATION Left 12/04/2023   Procedure: TRANSMETATARSAL AMPUTATION;  Surgeon: Janit Thresa HERO, DPM;  Location: [facility];  Service: Orthopedics/Podiatry;  Laterality: Left;    Allergies  Allergen Reactions   Latex Other (See Comments)    Blister and burning (97M insulin  pump  latex band aid )   Nitroglycerin  Nausea Only and Other (See Comments)    Patches only - headache   Statins Other (See Comments)    BODY PAIN. Taking rosuvastatin  in the evenings and tolerating.    Fluvastatin Other (See Comments)    Muscle pain   Fluvastatin Sodium Other (See Comments)    Myalgias   Percocet [Oxycodone -Acetaminophen ] Itching   Rosuvastatin  Other (See Comments)    Myalgias. Taking in evening and tolerating   Simvastatin Other (See Comments)    Muscle pain, Myalgias   Lipitor [Atorvastatin] Other (See Comments) and Rash    BODY PAIN   Lisinopril  Nausea Only and Other (See Comments)    Other reaction(s): Dizziness   Zetia [Ezetimibe] Other (See Comments)    Muscle Pain   Objective/Physical Exam Ulcer noted to the distal tip of the right third toe stable.  Intermixed  fibrotic and granular tissue noted.  The wound measures approximately 1.2 x 1.2 x 0.3 cm.  To the noted ulceration there is no eschar.  Moderate slough fibrin and necrotic tissue noted to the wound base.  No drainage.  No malodor.  The draining sinus to the left foot appears significantly improved.  It is very dry and stable.  It does not probe to the deeper tissue.  It measures approximately 0.4 x 0.1 x 0.2 cm.  Please see attached photos  MR FOOT LEFT WO CONTRAST 06/06/2024 IMPRESSION: 1. Marrow edema in the distal 3 cm of the first metatarsal remnant is worrisome for osteomyelitis. Minimal marrow edema in the distal 1.5-2 cm of the second and third metatarsal remnants is nonspecific and could be reactive or due to early osteomyelitis. 2. Fluid collection anterior and subjacent to the first metatarsal remnant is worrisome for abscess. 3. Diffuse subcutaneous edema about the imaged foot could be due to cellulitis or dependent change.  Aerobic/Anaerobic Culture w Gram Stain (surgical/deep wound) Order: [id number]  Status: Final result     Next appt: 07/26/2024 at 10:00 AM in Podiatry [facility] CHRISTELLA Sar, [place])   Test Result Released: Yes (not seen)   Specimen Information: Path Tissue  0 Result Notes    Component Ref Range & Units (hover) 7 d ago  Specimen Description TISSUE Performed at [facility], [address] [place], [place] [address]  Special Requests NONE Performed at [facility], [address] [place], [place] [address]  Gram Stain NO WBC SEEN NO ORGANISMS SEEN  Culture RARE STAPHYLOCOCCUS SAPROPHYTICUS NO ANAEROBES ISOLATED Performed at [facility], [address], [place], [place] [address]  Report Status 07/13/2024 FINAL  Organism ID, Bacteria STAPHYLOCOCCUS SAPROPHYTICUS  Resulting Agency [facility]     Susceptibility   Staphylococcus saprophyticus    MIC    CIPROFLOXACIN  <=0.5 SENSI... Sensitive    CLINDAMYCIN >=8 RESISTANT Resistant     ERYTHROMYCIN >=8 RESISTANT Resistant    GENTAMICIN  <=0.5 SENSI... Sensitive    Inducible Clindamycin NEGATIVE Sensitive    OXACILLIN 2 RESISTANT Resistant    RIFAMPIN <=0.5 SENSI... Sensitive    TETRACYCLINE <=1 SENSITIVE Sensitive    TRIMETH /SULFA  <=10 SENSIT... Sensitive    VANCOMYCIN  <=0.5 SENSI... Sensitive           Susceptibility Comments  Staphylococcus saprophyticus  RARE STAPHYLOCOCCUS SAPROPHYTICUS     Aerobic/Anaerobic Culture w Gram Stain (surgical/deep wound) Order: [id number]  Status: Final result     Next appt: 07/26/2024 at 10:00 AM in Podiatry [place] CHRISTELLA Sar, [place])   Test Result Released:  Yes (not seen)   Specimen Information: Abscess  0 Result Notes  important suggestion  Newer results are available. Click to view them now.      Component Ref Range & Units (hover) 7 d ago  Specimen Description ABSCESS Performed at [facility], [address] [place], [place] [address]  Special Requests NONE Performed at [facility], [address] [place], [place] [address]  Gram Stain RARE WBC PRESENT, PREDOMINANTLY PMN NO ORGANISMS SEEN  Culture RARE ENTEROCOCCUS FAECALIS VANCOMYCIN  RESISTANT ENTEROCOCCUS ISOLATED NO ANAEROBES ISOLATED Performed at [facility], [address], [place], [place] [address]  Report Status 07/13/2024 FINAL  Organism ID, Bacteria ENTEROCOCCUS FAECALIS  Resulting Agency [facility]     Susceptibility   Enterococcus faecalis    MIC    AMPICILLIN <=2 SENSITIVE Sensitive    GENTAMICIN  SYNERGY RESISTANT Resistant    VANCOMYCIN  >=32 RESIST... Resistant           Susceptibility Comments  Enterococcus faecalis  RARE ENTEROCOCCUS FAECALIS     Specimen Collected: 07/08/24 15:34    RT third toe 09/23/2024  LT foot 09/23/2024  Assessment: POV # 1 DOS 07/08/24 RT 3RD TO DEBRIDEMENT OF ULCER W/ GRAFT APPLICATION, INCISION AND DRAINAGE LEFT FOOT, BONE BIOPSY 1ST METATARSAL LEFT.   Diabetic ulcer toe  right with exposed Ulcer left foot with draining sinus  No diagnosis found.  Plan of Care:  -Patient was evaluated.  -Medically necessary excisional debridement including subcutaneous tissue was performed to the right third toe ulcer.  Excisional debridement of the necrotic nonviable tissue down to healthier bleeding viable tissue was performed with postdebridement measurement same as pre- -unfortunately the patient continues to have the persistent ulcer to the right foot.  It has failed several months of conservative routine debridements and care.  The best success that he had with the ulcer was when the graft was applied.  -Preauthorization for Application of skin graft pending -In regards to the left foot draining sinus, overall significant proved.  Currently there is no drainage coming from the area. -Continue management with infectious disease.  Last seen 08/18/2024 : ID plan of care - You are here for follow up of the left foot infection and rt 3rd toe infection. You are on 1 gram Amoxicillin  three times a day Will reduce the amoxicillin  to 500mg  three times a day.Follow up 1 month -Return to clinic 2 weeks  Thresa EMERSON Sar, DPM [facility]  Dr. Thresa EMERSON Sar, DPM    [address] [place], [place] [address]                Office [phone]  Fax [phone]

## 2024-10-20 ENCOUNTER — Ambulatory Visit: Payer: Medicare Other

## 2024-10-20 VITALS — Ht 71.0 in | Wt 281.0 lb

## 2024-10-20 DIAGNOSIS — Z0000 Encounter for general adult medical examination without abnormal findings: Secondary | ICD-10-CM

## 2024-10-20 NOTE — Progress Notes (Signed)
 Chief Complaint  Patient presents with   Medicare Wellness     Subjective:   Lawrence [name] is a 73 y.o. male who presents for a Medicare Annual Wellness Visit.  Visit info / Clinical Intake: Medicare Wellness Visit Type:: Subsequent Annual Wellness Visit Persons participating in visit and providing information:: patient Medicare Wellness Visit Mode:: Telephone If telephone:: video declined Since this visit was completed virtually, some vitals may be partially provided or unavailable. Missing vitals are due to the limitations of the virtual format.: Unable to obtain vitals - no equipment If Telephone or Video please confirm:: I connected with patient using audio/video enable telemedicine. I verified patient identity with two identifiers, discussed telehealth limitations, and patient agreed to proceed. Patient Location:: in car Provider Location:: clinic Interpreter Needed?: No Pre-visit prep was completed: yes AWV questionnaire completed by patient prior to visit?: no Living arrangements:: lives with spouse/significant other Patient's Overall Health Status Rating: good Typical amount of pain: (!) a lot Does pain affect daily life?: (!) yes Are you currently prescribed opioids?: no  Dietary Habits and Nutritional Risks How many meals a day?: 3 Eats fruit and vegetables daily?: yes Most meals are obtained by: preparing own meals; eating out In the last 2 weeks, have you had any of the following?: none Diabetic:: (!) yes Any non-healing wounds?: no How often do you check your BS?: 4; as needed (get continuous monitor first of the year) Would you like to be referred to a Nutritionist or for Diabetic Management? : no  Functional Status Activities of Daily Living (to include ambulation/medication): Independent Ambulation: Independent with device- listed below Home Assistive Devices/Equipment: Johna Finder (specify Type) Medication Administration: Independent Home Management  (perform basic housework or laundry): Independent Manage your own finances?: yes Primary transportation is: driving Concerns about vision?: (!) yes (some blurry vision some days) Concerns about hearing?: no  Fall Screening Falls in the past year?: 1 Number of falls in past year: 1 Was there an injury with Fall?: 0 Fall Risk Category Calculator: 2 Patient Fall Risk Level: Moderate Fall Risk  Fall Risk Patient at Risk for Falls Due to: Impaired balance/gait Fall risk Follow up: Falls evaluation completed; Education provided; Falls prevention discussed  Home and Transportation Safety: All rugs have non-skid backing?: (!) no All stairs or steps have railings?: yes (has ramp) Grab bars in the bathtub or shower?: yes (does not use the tub) Have non-skid surface in bathtub or shower?: yes Good home lighting?: yes Regular seat belt use?: yes Hospital stays in the last year:: no  Cognitive Assessment Difficulty concentrating, remembering, or making decisions? : yes Will 6CIT or Mini Cog be Completed: yes What year is it?: 0 points What month is it?: 0 points Give patient an address phrase to remember (5 components): [address] [place]  About what time is it?: 0 points Count backwards from 20 to 1: 0 points Say the months of the year in reverse: 0 points Repeat the address phrase from earlier: 0 points 6 CIT Score: 0 points  Advance Directives (For Healthcare) Does Patient Have a Medical Advance Directive?: Yes Does patient want to make changes to medical advance directive?: No - Patient declined Type of Advance Directive: Healthcare Power of [place]; Living will Copy of Healthcare Power of Attorney in Chart?: No - copy requested Copy of Living Will in Chart?: No - copy requested  Reviewed/Updated  Reviewed/Updated: Reviewed All (Medical, Surgical, Family, Medications, Allergies, Care Teams, Patient Goals)    Allergies (verified)  Latex, Nitroglycerin , Statins,  Fluvastatin, Fluvastatin sodium, Percocet [oxycodone -acetaminophen ], Rosuvastatin , Simvastatin, Lipitor [atorvastatin], Lisinopril , and Zetia [ezetimibe]   Current Medications (verified) Outpatient Encounter Medications as of 10/20/2024  Medication Sig   acetaminophen  (TYLENOL ) 500 MG tablet Take 1,000 mg by mouth every 6 (six) hours as needed for moderate pain (pain score 4-6).   AIMOVIG 140 MG/ML SOAJ Inject 140 mg into the skin every 30 (thirty) days.   albuterol  (VENTOLIN  HFA) 108 (90 Base) MCG/ACT inhaler Inhale 2 puffs into the lungs every 4 (four) hours as needed for wheezing.   amLODipine (NORVASC) 2.5 MG tablet Take 2.5 mg by mouth daily.   amoxicillin  (AMOXIL ) 500 MG capsule Take 2 capsules (1,000 mg total) by mouth 3 (three) times daily.   aspirin  81 MG EC tablet Take 81 mg by mouth daily. Swallow whole.   Atogepant (QULIPTA) 60 MG TABS Take 60 mg by mouth 1 day or 1 dose.   azelastine (ASTELIN) 0.1 % nasal spray Place 2 sprays into both nostrils as needed for rhinitis. Use in each nostril as directed   Calcium  Carb-Cholecalciferol  (CALCIUM  + D3 PO) Take 1 tablet by mouth in the morning and at bedtime.   carvedilol (COREG) 12.5 MG tablet Take 12.5 mg by mouth 2 (two) times daily with a meal. (Patient taking differently: Take 12.5 mg by mouth 2 (two) times daily with a meal. Taking half a tab as of 08/16/24)   Cholecalciferol  (VITAMIN D ) 50 MCG (2000 UT) tablet Take 2,000 Units by mouth in the morning and at bedtime.   clopidogrel  (PLAVIX ) 75 MG tablet Take 1 tablet (75 mg total) by mouth daily.   famotidine  (ACID CONTROLLER) 10 MG tablet Take 10 mg by mouth in the morning.   furosemide  (LASIX ) 40 MG tablet Take 1 tablet (40 mg total) by mouth daily.   glucagon 1 MG injection Inject 1 mg into the skin once as needed.   GLUTOSE 15 40 % GEL Take 1 Tube by mouth once as needed for low blood sugar.   insulin  aspart (NOVOLOG ) 100 UNIT/ML injection Inject 20 Units into the skin 3 (three)  times daily. (Patient taking differently: Inject 10-20 Units into the skin 3 (three) times daily with meals. Sliding Scale will go up to 25 units for BG > 300 - 400)   insulin  glargine (LANTUS  SOLOSTAR) 100 UNIT/ML Solostar Pen 20 Units.   insulin  glargine-yfgn (SEMGLEE ) 100 UNIT/ML injection Inject 20 Units into the skin at bedtime.   isosorbide  mononitrate (IMDUR ) 60 MG 24 hr tablet Take 60 mg by mouth in the morning and at bedtime.   loratadine  (CLARITIN ) 10 MG tablet Take 10 mg by mouth in the morning.   nitroGLYCERIN  (NITROSTAT ) 0.4 MG SL tablet DISSOLVE 1 TABLET UNDER THE TONGUE EVERY 5 MINUTES AS NEEDED FOR CHEST PAIN   potassium chloride  SA (K-DUR,KLOR-CON ) 20 MEQ tablet Take 1 tablet (20 mEq total) by mouth daily.   rosuvastatin  (CRESTOR ) 20 MG tablet Take 1 tablet (20 mg total) by mouth daily at 6 PM.   Travoprost, BAK Free, (TRAVATAN) 0.004 % SOLN ophthalmic solution Place 1 drop into both eyes at bedtime.    vitamin C  (ASCORBIC ACID ) 500 MG tablet Take 500 mg by mouth 2 (two) times daily.    zonisamide (ZONEGRAN) 100 MG capsule Take 3 capsules (300 mg total) by mouth once daily .   No facility-administered encounter medications on file as of 10/20/2024.    History: Past Medical History:  Diagnosis Date   Allergies  Anginal pain    Aortic atherosclerosis    Bilateral carotid artery disease    Breast pain, left 08/29/2022   CAD (coronary artery disease) 07/2007   a.) s/p NSTEMI 07/2007 - sig multi-vessel CAD not amenable to PCI; b.) LHC 06/11/2016: multi vessel CAD not amenable to PCI --> refer to CVTS; c.) s/p 4v CABG 06/27/2016; d.) LHC 06/09/2023: 75% mLM, 80% o-pLAD, 60% mLAD, 100% p-mLCx, 100% OM1, 95% pRCA, 100% mRCA, 50% m-dLAD. LIMA-LAD and SVG-OM1 patent. CTO SVG-RCA with L-R collaterals - med mgmt   Carpal tunnel syndrome on left    a.) s/p release 04/2016   Cerebral microvascular disease    CKD (chronic kidney disease), stage III (HCC)    COVID-19 12/09/2022    Diabetic peripheral neuropathy (HCC)    Diastolic dysfunction    a.) TTE 06/25/2016: EF 60-654%. no RWMAs, G1DD, mild AoV annular calc; b.) TTE 03/19/2017: EF 50-55%, no RWMAs, mild MR; c.) TTE 07/22/2019: EF 55-60%, midl LVH, no RWMAs, triv MR, mild TR   Dizziness 04/30/2021   Dyspnea    GERD (gastroesophageal reflux disease)    Glaucoma    Headache    History of bilateral cataract extraction    HLD (hyperlipidemia)    HTN (hypertension)    Long term current use of clopidogrel     Long-term use of aspirin  therapy    MRSA (methicillin resistant staph aureus) culture positive    2006 - 2008   NSTEMI (non-ST elevated myocardial infarction) (HCC) 07/27/2007   a.) LHC 07/30/2007: 30% mLAD, 99% D1, 50% pLCx, 40% mLCx, 70/75/85% dLCx, 50% OM1, 75/80/80 OM2, 50% pRCA, 60% mRCA, 85% dRCA --> not amenable to PCI   OSA on CPAP    Osteoarthritis    Osteomyelitis of left foot (HCC)    PAD (peripheral artery disease)    a. 03/2022 Lower Ext Angio: No signif Ao-iliac dzs. R PT diff dzs and occluded above ankle. R Pedal arch intact w/ excellent antegrade flow provided by R AT-->no indication for revasc.   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   a.) LIMA-LAD, SVG-RCA, seq SVG-OM1-OM2   S/P tube myringotomy    Skin cancer, basal cell    Skin ulcer of right great toe, unspecified ulcer stage (HCC)    T1DM (type 1 diabetes mellitus) (HCC)    Wears glasses    Wears partial dentures    top   Past Surgical History:  Procedure Laterality Date   ABDOMINAL AORTOGRAM W/LOWER EXTREMITY N/A 03/26/2022   Procedure: ABDOMINAL AORTOGRAM W/LOWER EXTREMITY;  Surgeon: Darron Deatrice LABOR, MD;  Location: [facility];  Service: Cardiovascular;  Laterality: N/A;   AMPUTATION TOE Bilateral 06/26/2023   Procedure: AMPUTATION TOE MPJ OF THE JOINT,PARTIAL TOE AMPUTUATION;  Surgeon: Janit Thresa HERO, DPM;  Location: [facility];  Service: Orthopedics/Podiatry;   Laterality: Bilateral;   AMPUTATION TOE Right 12/04/2023   Procedure: AMPUTATION TOE INTERPHALANGEAL 3RD;  Surgeon: Janit Thresa HERO, DPM;  Location: [facility];  Service: Orthopedics/Podiatry;  Laterality: Right;   APPENDECTOMY     CARDIAC CATHETERIZATION N/A 06/11/2016   Procedure: Left Heart Cath and Coronary Angiography;  Surgeon: Wolm JINNY Rhyme, MD;  Location: [facility];  Service: Cardiovascular;  Laterality: N/A;   CARPAL TUNNEL RELEASE Left 04/24/2016   Procedure: CARPAL TUNNEL RELEASE;  Surgeon: Ozell Flake, MD;  Location: [facility];  Service: Orthopedics;  Laterality: Left;   CATARACT EXTRACTION W/ INTRAOCULAR LENS  IMPLANT Right 07/16/2012   CATARACT EXTRACTION W/ INTRAOCULAR LENS IMPLANT Left 08/13/2012   CORONARY ARTERY BYPASS GRAFT N/A 06/27/2016   Procedure: CORONARY ARTERY BYPASS GRAFTING times four using left internal mammary artery and right leg saphenous vein;  Surgeon: Maude Fleeta Ochoa, MD;  Location: [facility] OR;  Service: Open Heart Surgery;  Laterality: N/A;   FOREARM FRACTURE SURGERY Left 1961   IRRIGATION AND DEBRIDEMENT ABSCESS Right 07/08/2024   Procedure: IRRIGATION AND DEBRIDEMENT ULCER, WITH GRAFT APPLICATION RIGHT THIRD TOE;  Surgeon: Janit Thresa HERO, DPM;  Location: [facility];  Service: Orthopedics/Podiatry;  Laterality: Right;   IRRIGATION AND DEBRIDEMENT FOOT Left 07/08/2024   Procedure: IRRIGATION AND DEBRIDEMENT FOOT AMPUTATION STUMB, BONE BIOPSY FIRST METATARSAL;  Surgeon: Janit Thresa HERO, DPM;  Location: [facility];  Service: Orthopedics/Podiatry;  Laterality: Left;   KNEE ARTHROPLASTY Left 02/11/2016   Procedure: COMPUTER ASSISTED TOTAL KNEE ARTHROPLASTY;  Surgeon: Lynwood SHAUNNA Hue, MD;  Location: [facility];  Service: Orthopedics;  Laterality: Left;   KNEE ARTHROSCOPY Right 2000   LEFT HEART CATH AND CORONARY ANGIOGRAPHY Left 07/30/2007   Procedure: LEFT HEART CATH AND CORONARY ANGIOGRAPHY; Location: [facility]; Surgeon: Wolm Rhyme, MD   LEFT HEART CATH AND CORS/GRAFTS  ANGIOGRAPHY Left 01/13/2017   Procedure: Left Heart Cath and Cors/Grafts Angiography;  Surgeon: Wolm JINNY Rhyme, MD;  Location: [facility];  Service: Cardiovascular;  Laterality: Left;   LEFT HEART CATH AND CORS/GRAFTS ANGIOGRAPHY Left 06/09/2023   Procedure: LEFT HEART CATH AND CORS/GRAFTS ANGIOGRAPHY;  Surgeon: Ammon Blunt, MD;  Location: [facility];  Service: Cardiovascular;  Laterality: Left;   LOWER EXTREMITY ANGIOGRAPHY Left 02/23/2024   Procedure: Lower Extremity Angiography;  Surgeon: Jama Cordella MATSU, MD;  Location: [facility];  Service: Cardiovascular;  Laterality: Left;   LOWER EXTREMITY ANGIOGRAPHY Right 05/03/2024   Procedure: Lower Extremity Angiography;  Surgeon: Jama Cordella MATSU, MD;  Location: [facility];  Service: Cardiovascular;  Laterality: Right;   LOWER EXTREMITY INTERVENTION Left 02/23/2024   Procedure: LOWER EXTREMITY INTERVENTION;  Surgeon: Jama Cordella MATSU, MD;  Location: [facility];  Service: Cardiovascular;  Laterality: Left;   METATARSAL HEAD EXCISION Right 03/21/2020   Procedure: METATARSAL HEAD RESECTION RIGHT AND DEBRIDEMENT OF ULCER;  Surgeon: Janit Thresa HERO, DPM;  Location: [facility];  Service: Podiatry;  Laterality: Right;   METATARSAL HEAD EXCISION Left 09/15/2023   Procedure: METATARSAL HEAD EXCISION;  Surgeon: Janit Thresa HERO, DPM;  Location: [facility];  Service: Orthopedics/Podiatry;  Laterality: Left;   MULTIPLE TOOTH EXTRACTIONS     TEE WITHOUT CARDIOVERSION N/A 06/27/2016   Procedure: TRANSESOPHAGEAL ECHOCARDIOGRAM (TEE);  Surgeon: Maude Fleeta Ochoa, MD;  Location: [facility] OR;  Service: Open Heart Surgery;  Laterality: N/A;   TENDON LENGTHENING Left 12/04/2023   Procedure: TENDON-ACHILLES LENGTHENING;  Surgeon: Janit Thresa HERO, DPM;  Location: [facility];  Service: Orthopedics/Podiatry;  Laterality: Left;   TRANSMETATARSAL AMPUTATION Left 12/04/2023   Procedure: TRANSMETATARSAL AMPUTATION;  Surgeon: Janit Thresa HERO, DPM;  Location: [facility];  Service: Orthopedics/Podiatry;  Laterality: Left;   Family History  Problem Relation Age of Onset   Hypertension Mother    Lupus Mother    Heart Problems Mother    Hypertension Father    Diabetes Father    Heart attack Father 31       died in his 79s   Drug abuse Brother    Heart disease Brother    Heart attack Maternal Grandmother    Arthritis Maternal Grandmother  Arthritis Maternal Grandfather    Heart attack Paternal Grandmother    Arthritis Paternal Grandmother    Arthritis Paternal Grandfather    Social History   Occupational History   Not on file  Tobacco Use   Smoking status: Former    Current packs/day: 0.00    Average packs/day: 1 pack/day for 16.7 years (16.7 ttl pk-yrs)    Types: Cigarettes    Start date: 11/03/1969    Quit date: 07/22/1986    Years since quitting: 38.2   Smokeless tobacco: Never  Vaping Use   Vaping status: Never Used  Substance and Sexual Activity   Alcohol use: Not Currently   Drug use: No   Sexual activity: Never   Tobacco Counseling Counseling given: Not Answered  SDOH Screenings   Food Insecurity: No Food Insecurity (10/20/2024)  Housing: Unknown (10/20/2024)  Transportation Needs: No Transportation Needs (10/20/2024)  Utilities: Not At Risk (10/20/2024)  Alcohol Screen: Low Risk (10/20/2023)  Depression (PHQ2-9): Low Risk (10/20/2024)  Financial Resource Strain: Low Risk  (06/16/2024)   Received from [facility]  Physical Activity: Inactive (10/20/2024)  Social Connections: Moderately Integrated (10/20/2024)  Stress: No Stress Concern Present (10/20/2024)  Tobacco Use: Medium Risk (10/20/2024)  Health Literacy: Adequate Health Literacy (10/20/2024)   See flowsheets for full screening details  Depression Screen PHQ 2 & 9 Depression Scale- Over the past 2 weeks, how often have you been bothered by any of the following problems? Little interest or pleasure in doing  things: 0 Feeling down, depressed, or hopeless (PHQ Adolescent also includes...irritable): 1 PHQ-2 Total Score: 1     Goals Addressed             This Visit's Progress    not new goals       COMPLETED: Patient Stated       Would like to continue to lose weight      COMPLETED: Patient Stated       Stay healthy and active      Patient Stated   On track    Pt says he would like to live to see grand graduate             Objective:    Today's Vitals   10/20/24 1305  Weight: 281 lb (127.5 kg)  Height: 5' 11 (1.803 m)   Body mass index is 39.19 kg/m.  Hearing/Vision screen Vision Screening - Comments:: UTD w/Dr Porfilio Immunizations and Health Maintenance Health Maintenance  Topic Date Due   DTaP/Tdap/Td (5 - Td or Tdap) 10/19/2018   COVID-19 Vaccine (5 - 2025-26 season) 07/04/2024   Medicare Annual Wellness (AWV)  10/19/2024   FOOT EXAM  09/21/2024   Influenza Vaccine  01/31/2025 (Originally 06/03/2024)   HEMOGLOBIN A1C  12/10/2024   OPHTHALMOLOGY EXAM  05/09/2025   Diabetic kidney evaluation - Urine ACR  06/28/2025   Diabetic kidney evaluation - eGFR measurement  08/18/2025   Fecal DNA (Cologuard)  11/07/2026   Pneumococcal Vaccine: 50+ Years  Completed   Hepatitis C Screening  Completed   Zoster Vaccines- Shingrix  Completed   Meningococcal B Vaccine  Aged Out   Mammogram  Discontinued        Assessment/Plan:  This is a routine wellness examination for Lawrence [name]  Patient Care Team: Gretta Comer POUR, NP as PCP - General (Internal Medicine) Jaye Fallow, MD as Referring Physician (Ophthalmology) Launie Maiden, Ronal Maxwell, NP as Nurse Practitioner (Nurse Practitioner)  I have personally reviewed and noted the following in  the patients chart:   Medical and social history Use of alcohol, tobacco or illicit drugs  Current medications and supplements including opioid prescriptions. Functional ability and status Nutritional status Physical  activity Advanced directives List of other physicians Hospitalizations, surgeries, and ER visits in previous 12 months Vitals Screenings to include cognitive, depression, and falls Referrals and appointments  No orders of the defined types were placed in this encounter.  In addition, I have reviewed and discussed with patient certain preventive protocols, quality metrics, and best practice recommendations. A written personalized care plan for preventive services as well as general preventive health recommendations were provided to patient.   Erminio LITTIE Saris, LPN   [id number]   No follow-ups on file.Pt declined to schedule follow up appts as he says he is switching over to [facility]  After Visit Summary: (MyChart) Due to this being a telephonic visit, the after visit summary with patients personalized plan was offered to patient via MyChart   Nurse Notes: No voiced or noted concerns at this time HM Addressed: Pt will get Covid vaccine at pharmacy when decides;Foot exam to be doen at next PCP visit (pt says she is switching to another health system due to insurance

## 2024-10-20 NOTE — Patient Instructions (Signed)
 [name],  Thank you for taking the time for your Medicare Wellness Visit. I appreciate your continued commitment to your health goals. Please review the care plan we discussed, and feel free to reach out if I can assist you further.  Please note that Annual Wellness Visits do not include a physical exam. Some assessments may be limited, especially if the visit was conducted virtually. If needed, we may recommend an in-person follow-up with your provider.  Ongoing Care Seeing your primary care provider every 3 to 6 months helps us  monitor your health and provide consistent, personalized care.   Referrals If a referral was made during today's visit and you haven't received any updates within two weeks, please contact the referred provider directly to check on the status.  Recommended Screenings:  Health Maintenance  Topic Date Due   DTaP/Tdap/Td vaccine (5 - Td or Tdap) 10/19/2018   COVID-19 Vaccine (5 - 2025-26 season) 07/04/2024   Medicare Annual Wellness Visit  10/19/2024   Complete foot exam   09/21/2024   Flu Shot  01/31/2025*   Hemoglobin A1C  12/10/2024   Eye exam for diabetics  05/09/2025   Yearly kidney health urinalysis for diabetes  06/28/2025   Yearly kidney function blood test for diabetes  08/18/2025   Cologuard (Stool DNA test)  11/07/2026   Pneumococcal Vaccine for age over 35  Completed   Hepatitis C Screening  Completed   Zoster (Shingles) Vaccine  Completed   Meningitis B Vaccine  Aged Out   Breast Cancer Screening  Discontinued  *Topic was postponed. The date shown is not the original due date.       10/20/2024    1:06 PM  Advanced Directives  Does Patient Have a Medical Advance Directive? Yes  Type of Estate Agent of [place];Living will  Does patient want to make changes to medical advance directive? No - Patient declined  Copy of Healthcare Power of Attorney in Chart? No - copy requested    Vision: Annual vision screenings are  recommended for early detection of glaucoma, cataracts, and diabetic retinopathy. These exams can also reveal signs of chronic conditions such as diabetes and high blood pressure.  Dental: Annual dental screenings help detect early signs of oral cancer, gum disease, and other conditions linked to overall health, including heart disease and diabetes.

## 2024-10-25 ENCOUNTER — Encounter: Payer: Self-pay | Admitting: Podiatry

## 2024-10-25 ENCOUNTER — Ambulatory Visit (INDEPENDENT_AMBULATORY_CARE_PROVIDER_SITE_OTHER): Admitting: Podiatry

## 2024-10-25 VITALS — Ht 71.0 in | Wt 281.0 lb

## 2024-10-25 DIAGNOSIS — M86472 Chronic osteomyelitis with draining sinus, left ankle and foot: Secondary | ICD-10-CM

## 2024-10-25 DIAGNOSIS — L97512 Non-pressure chronic ulcer of other part of right foot with fat layer exposed: Secondary | ICD-10-CM | POA: Diagnosis not present

## 2024-10-25 DIAGNOSIS — E08621 Diabetes mellitus due to underlying condition with foot ulcer: Secondary | ICD-10-CM

## 2024-10-25 NOTE — Progress Notes (Signed)
 "  No chief complaint on file.   Subjective:  Patient presents today status post B/L foot surgery.  DOS: 07/08/2024.  Past Medical History:  Diagnosis Date   Allergies    Anginal pain    Aortic atherosclerosis    Bilateral carotid artery disease    Breast pain, left 08/29/2022   CAD (coronary artery disease) 07/2007   a.) s/p NSTEMI 07/2007 - sig multi-vessel CAD not amenable to PCI; b.) LHC 06/11/2016: multi vessel CAD not amenable to PCI --> refer to CVTS; c.) s/p 4v CABG 06/27/2016; d.) LHC 06/09/2023: 75% mLM, 80% o-pLAD, 60% mLAD, 100% p-mLCx, 100% OM1, 95% pRCA, 100% mRCA, 50% m-dLAD. LIMA-LAD and SVG-OM1 patent. CTO SVG-RCA with L-R collaterals - med mgmt   Carpal tunnel syndrome on left    a.) s/p release 04/2016   Cerebral microvascular disease    CKD (chronic kidney disease), stage III (HCC)    COVID-19 12/09/2022   Diabetic peripheral neuropathy (HCC)    Diastolic dysfunction    a.) TTE 06/25/2016: EF 60-654%. no RWMAs, G1DD, mild AoV annular calc; b.) TTE 03/19/2017: EF 50-55%, no RWMAs, mild MR; c.) TTE 07/22/2019: EF 55-60%, midl LVH, no RWMAs, triv MR, mild TR   Dizziness 04/30/2021   Dyspnea    GERD (gastroesophageal reflux disease)    Glaucoma    Headache    History of bilateral cataract extraction    HLD (hyperlipidemia)    HTN (hypertension)    Long term current use of clopidogrel     Long-term use of aspirin  therapy    MRSA (methicillin resistant staph aureus) culture positive    2006 - 2008   NSTEMI (non-ST elevated myocardial infarction) (HCC) 07/27/2007   a.) LHC 07/30/2007: 30% mLAD, 99% D1, 50% pLCx, 40% mLCx, 70/75/85% dLCx, 50% OM1, 75/80/80 OM2, 50% pRCA, 60% mRCA, 85% dRCA --> not amenable to PCI   OSA on CPAP    Osteoarthritis    Osteomyelitis of left foot (HCC)    PAD (peripheral artery disease)    a. 03/2022 Lower Ext Angio: No signif Ao-iliac dzs. R PT diff dzs and occluded above ankle. R Pedal arch intact w/ excellent antegrade flow provided by  R AT-->no indication for revasc.   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   a.) LIMA-LAD, SVG-RCA, seq SVG-OM1-OM2   S/P tube myringotomy    Skin cancer, basal cell    Skin ulcer of right great toe, unspecified ulcer stage (HCC)    T1DM (type 1 diabetes mellitus) (HCC)    Wears glasses    Wears partial dentures    top    Past Surgical History:  Procedure Laterality Date   ABDOMINAL AORTOGRAM W/LOWER EXTREMITY N/A 03/26/2022   Procedure: ABDOMINAL AORTOGRAM W/LOWER EXTREMITY;  Surgeon: Darron Deatrice LABOR, MD;  [facility];  Service: Cardiovascular;  Laterality: N/A;   AMPUTATION TOE Bilateral 06/26/2023   Procedure: AMPUTATION TOE MPJ OF THE JOINT,PARTIAL TOE AMPUTUATION;  Surgeon: Janit Thresa HERO, DPM;  Location: [facility];  Service: Orthopedics/Podiatry;  Laterality: Bilateral;   AMPUTATION TOE Right 12/04/2023   Procedure: AMPUTATION TOE INTERPHALANGEAL 3RD;  Surgeon: Janit Thresa HERO, DPM;  Location: [facility];  Service: Orthopedics/Podiatry;  Laterality: Right;   APPENDECTOMY     CARDIAC CATHETERIZATION N/A 06/11/2016   Procedure: Left Heart Cath and Coronary Angiography;  Surgeon: Wolm JINNY Rhyme, MD;  Location: [facility];  Service: Cardiovascular;  Laterality: N/A;  CARPAL TUNNEL RELEASE Left 04/24/2016   Procedure: CARPAL TUNNEL RELEASE;  Surgeon: Ozell Flake, MD;  Location: [facility];  Service: Orthopedics;  Laterality: Left;   CATARACT EXTRACTION W/ INTRAOCULAR LENS IMPLANT Right 07/16/2012   CATARACT EXTRACTION W/ INTRAOCULAR LENS IMPLANT Left 08/13/2012   CORONARY ARTERY BYPASS GRAFT N/A 06/27/2016   Procedure: CORONARY ARTERY BYPASS GRAFTING times four using left internal mammary artery and right leg saphenous vein;  Surgeon: Maude Fleeta Ochoa, MD;  Location: [facility] OR;  Service: Open Heart Surgery;  Laterality: N/A;   FOREARM FRACTURE SURGERY Left 1961   IRRIGATION AND DEBRIDEMENT ABSCESS Right 07/08/2024    Procedure: IRRIGATION AND DEBRIDEMENT ULCER, WITH GRAFT APPLICATION RIGHT THIRD TOE;  Surgeon: Janit Thresa HERO, DPM;  Location: [facility];  Service: Orthopedics/Podiatry;  Laterality: Right;   IRRIGATION AND DEBRIDEMENT FOOT Left 07/08/2024   Procedure: IRRIGATION AND DEBRIDEMENT FOOT AMPUTATION STUMB, BONE BIOPSY FIRST METATARSAL;  Surgeon: Janit Thresa HERO, DPM;  Location: [facility];  Service: Orthopedics/Podiatry;  Laterality: Left;   KNEE ARTHROPLASTY Left 02/11/2016   Procedure: COMPUTER ASSISTED TOTAL KNEE ARTHROPLASTY;  Surgeon: Lynwood SHAUNNA Hue, MD;  Location: [facility];  Service: Orthopedics;  Laterality: Left;   KNEE ARTHROSCOPY Right 2000   LEFT HEART CATH AND CORONARY ANGIOGRAPHY Left 07/30/2007   Procedure: LEFT HEART CATH AND CORONARY ANGIOGRAPHY; Location: [facility]; Surgeon: Wolm Rhyme, MD   LEFT HEART CATH AND CORS/GRAFTS ANGIOGRAPHY Left 01/13/2017   Procedure: Left Heart Cath and Cors/Grafts Angiography;  Surgeon: Wolm JINNY Rhyme, MD;  Location: [facility];  Service: Cardiovascular;  Laterality: Left;   LEFT HEART CATH AND CORS/GRAFTS ANGIOGRAPHY Left 06/09/2023   Procedure: LEFT HEART CATH AND CORS/GRAFTS ANGIOGRAPHY;  Surgeon: Ammon Blunt, MD;  Location: [facility];  Service: Cardiovascular;  Laterality: Left;   LOWER EXTREMITY ANGIOGRAPHY Left 02/23/2024   Procedure: Lower Extremity Angiography;  Surgeon: Jama Cordella MATSU, MD;  Location: [facility];  Service: Cardiovascular;  Laterality: Left;   LOWER EXTREMITY ANGIOGRAPHY Right 05/03/2024   Procedure: Lower Extremity Angiography;  Surgeon: Jama Cordella MATSU, MD;  Location: [facility];  Service: Cardiovascular;  Laterality: Right;   LOWER EXTREMITY INTERVENTION Left 02/23/2024   Procedure: LOWER EXTREMITY INTERVENTION;  Surgeon: Jama Cordella MATSU, MD;  Location: [facility];  Service: Cardiovascular;  Laterality: Left;   METATARSAL HEAD EXCISION Right 03/21/2020   Procedure:  METATARSAL HEAD RESECTION RIGHT AND DEBRIDEMENT OF ULCER;  Surgeon: Janit Thresa HERO, DPM;  Location: [facility];  Service: Podiatry;  Laterality: Right;   METATARSAL HEAD EXCISION Left 09/15/2023   Procedure: METATARSAL HEAD EXCISION;  Surgeon: Janit Thresa HERO, DPM;  Location: [facility];  Service: Orthopedics/Podiatry;  Laterality: Left;   MULTIPLE TOOTH EXTRACTIONS     TEE WITHOUT CARDIOVERSION N/A 06/27/2016   Procedure: TRANSESOPHAGEAL ECHOCARDIOGRAM (TEE);  Surgeon: Maude Fleeta Ochoa, MD;  Location: [facility];  Service: Open Heart Surgery;  Laterality: N/A;   TENDON LENGTHENING Left 12/04/2023   Procedure: TENDON-ACHILLES LENGTHENING;  Surgeon: Janit Thresa HERO, DPM;  Location: [facility];  Service: Orthopedics/Podiatry;  Laterality: Left;   TRANSMETATARSAL AMPUTATION Left 12/04/2023   Procedure: TRANSMETATARSAL AMPUTATION;  Surgeon: Janit Thresa HERO, DPM;  Location: [facility];  Service: Orthopedics/Podiatry;  Laterality: Left;    Allergies  Allergen Reactions   Latex Other (See Comments)    Blister and burning (60M insulin  pump latex band aid )   Nitroglycerin  Nausea Only and Other (See Comments)    Patches only - headache  Statins Other (See Comments)    BODY PAIN. Taking rosuvastatin  in the evenings and tolerating.    Fluvastatin Other (See Comments)    Muscle pain   Fluvastatin Sodium Other (See Comments)    Myalgias   Percocet [Oxycodone -Acetaminophen ] Itching   Rosuvastatin  Other (See Comments)    Myalgias. Taking in evening and tolerating   Simvastatin Other (See Comments)    Muscle pain, Myalgias   Lipitor [Atorvastatin] Other (See Comments) and Rash    BODY PAIN   Lisinopril  Nausea Only and Other (See Comments)    Other reaction(s): Dizziness   Zetia [Ezetimibe] Other (See Comments)    Muscle Pain   Objective/Physical Exam Ulcer noted to the distal tip of the right third toe stable.  Intermixed fibrotic and granular tissue noted.  The wound measures approximately 1.2 x 1.2 x 0.3 cm.  To  the noted ulceration there is no eschar.  Moderate slough fibrin and necrotic tissue noted to the wound base.  No drainage.  No malodor.  The draining sinus to the left foot appears significantly improved.  It is very dry and stable.  It does not probe to the deeper tissue.  It measures approximately 0.4 x 0.1 x 0.2 cm.  Please see attached photos  MR FOOT LEFT WO CONTRAST 06/06/2024 IMPRESSION: 1. Marrow edema in the distal 3 cm of the first metatarsal remnant is worrisome for osteomyelitis. Minimal marrow edema in the distal 1.5-2 cm of the second and third metatarsal remnants is nonspecific and could be reactive or due to early osteomyelitis. 2. Fluid collection anterior and subjacent to the first metatarsal remnant is worrisome for abscess. 3. Diffuse subcutaneous edema about the imaged foot could be due to cellulitis or dependent change.  Aerobic/Anaerobic Culture w Gram Stain (surgical/deep wound) Order: [id number]  Status: Final result     Next appt: 07/26/2024 at 10:00 AM in Podiatry [facility] CHRISTELLA Sar, [place])   Test Result Released: Yes (not seen)   Specimen Information: Path Tissue  0 Result Notes    Component Ref Range & Units (hover) 7 d ago  Specimen Description TISSUE Performed at [facility], [address] [place], [place] [address]  Special Requests NONE Performed at [facility], [address] [place], [place] [address]  Gram Stain NO WBC SEEN NO ORGANISMS SEEN  Culture RARE STAPHYLOCOCCUS SAPROPHYTICUS NO ANAEROBES ISOLATED Performed at [facility], [address], [place], [place] [address]  Report Status 07/13/2024 FINAL  Organism ID, Bacteria STAPHYLOCOCCUS SAPROPHYTICUS  Resulting Agency [facility]     Susceptibility   Staphylococcus saprophyticus    MIC    CIPROFLOXACIN  <=0.5 SENSI... Sensitive    CLINDAMYCIN >=8 RESISTANT Resistant    ERYTHROMYCIN >=8 RESISTANT Resistant    GENTAMICIN  <=0.5 SENSI... Sensitive    Inducible  Clindamycin NEGATIVE Sensitive    OXACILLIN 2 RESISTANT Resistant    RIFAMPIN <=0.5 SENSI... Sensitive    TETRACYCLINE <=1 SENSITIVE Sensitive    TRIMETH /SULFA  <=10 SENSIT... Sensitive    VANCOMYCIN  <=0.5 SENSI... Sensitive           Susceptibility Comments  Staphylococcus saprophyticus  RARE STAPHYLOCOCCUS SAPROPHYTICUS     Aerobic/Anaerobic Culture w Gram Stain (surgical/deep wound) Order: [id number]  Status: Final result     Next appt: 07/26/2024 at 10:00 AM in Podiatry [facility] CHRISTELLA Sar, [place])   Test Result Released: Yes (not seen)   Specimen Information: Abscess  0 Result Notes  important suggestion  Newer results are available. Click to  view them now.      Component Ref Range & Units (hover) 7 d ago  Specimen Description ABSCESS Performed at [facility], [address] [place], [place] [address]  Special Requests NONE Performed at [facility], [address] [place], [place] [address]  Gram Stain RARE WBC PRESENT, PREDOMINANTLY PMN NO ORGANISMS SEEN  Culture RARE ENTEROCOCCUS FAECALIS VANCOMYCIN  RESISTANT ENTEROCOCCUS ISOLATED NO ANAEROBES ISOLATED Performed at [facility], [address], [place], [place] [address]  Report Status 07/13/2024 FINAL  Organism ID, Bacteria ENTEROCOCCUS FAECALIS  Resulting Agency [facility]     Susceptibility   Enterococcus faecalis    MIC    AMPICILLIN <=2 SENSITIVE Sensitive    GENTAMICIN  SYNERGY RESISTANT Resistant    VANCOMYCIN  >=32 RESIST... Resistant           Susceptibility Comments  Enterococcus faecalis  RARE ENTEROCOCCUS FAECALIS     Specimen Collected: 07/08/24 15:34    RT third toe 09/23/2024  LT foot 09/23/2024  Assessment: POV # 1 DOS 07/08/24 RT 3RD TO DEBRIDEMENT OF ULCER W/ GRAFT APPLICATION, INCISION AND DRAINAGE LEFT FOOT, BONE BIOPSY 1ST METATARSAL LEFT.   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  Plan of Care:   -Patient was evaluated.  -Medically necessary excisional debridement including subcutaneous tissue was performed to the right third toe ulcer.  Excisional debridement of the necrotic nonviable tissue down to healthier bleeding viable tissue was performed with postdebridement measurement same as pre- -unfortunately the patient continues to have the persistent ulcer to the right foot.  It has failed several months of conservative routine debridements and care.  The best success that he had with the ulcer was when the graft was applied.   -Preauthorization for Application of skin graft pending -In regards to the left foot draining sinus, overall significant proved.  Currently there is no drainage coming from the area. -Patient has completed management with ID. Last seen 09/27/2024: ID plan of care - You are here for follow up of the left foot infection and rt 3rd toe infection. You are on 1 gram Amoxicillin  three times a day Will reduce the amoxicillin  to 500mg  three times a day.Follow up 1 month -Return to clinic 3 weeks  Thresa EMERSON Sar, DPM [facility]  Dr. Thresa EMERSON Sar, DPM    [address] [place], [place] [address]                Office [phone]  Fax [phone]      "

## 2024-11-11 ENCOUNTER — Other Ambulatory Visit

## 2024-11-11 ENCOUNTER — Encounter: Payer: Self-pay | Admitting: Podiatry

## 2024-11-11 ENCOUNTER — Ambulatory Visit: Admitting: Podiatry

## 2024-11-11 VITALS — Ht 71.0 in | Wt 281.0 lb

## 2024-11-11 DIAGNOSIS — E08621 Diabetes mellitus due to underlying condition with foot ulcer: Secondary | ICD-10-CM | POA: Diagnosis not present

## 2024-11-11 DIAGNOSIS — L97512 Non-pressure chronic ulcer of other part of right foot with fat layer exposed: Secondary | ICD-10-CM

## 2024-11-11 NOTE — Progress Notes (Signed)
 "  Chief Complaint  Patient presents with   Diabetic Ulcer    Pt is here to f/u on right foot due to diabetic ulcer, still draining, no other changes.    Subjective:  Patient presents today status post B/L foot surgery.  DOS: 07/08/2024.  Doing well.  Feet stable.  They have been applying Prisma collagen to the right third toe ulcer with a light dressing daily.  No new complaints  Past Medical History:  Diagnosis Date   Allergies    Anginal pain    Aortic atherosclerosis    Bilateral carotid artery disease    Breast pain, left 08/29/2022   CAD (coronary artery disease) 07/2007   a.) s/p NSTEMI 07/2007 - sig multi-vessel CAD not amenable to PCI; b.) LHC 06/11/2016: multi vessel CAD not amenable to PCI --> refer to CVTS; c.) s/p 4v CABG 06/27/2016; d.) LHC 06/09/2023: 75% mLM, 80% o-pLAD, 60% mLAD, 100% p-mLCx, 100% OM1, 95% pRCA, 100% mRCA, 50% m-dLAD. LIMA-LAD and SVG-OM1 patent. CTO SVG-RCA with L-R collaterals - med mgmt   Carpal tunnel syndrome on left    a.) s/p release 04/2016   Cerebral microvascular disease    CKD (chronic kidney disease), stage III (HCC)    COVID-19 12/09/2022   Diabetic peripheral neuropathy (HCC)    Diastolic dysfunction    a.) TTE 06/25/2016: EF 60-654%. no RWMAs, G1DD, mild AoV annular calc; b.) TTE 03/19/2017: EF 50-55%, no RWMAs, mild MR; c.) TTE 07/22/2019: EF 55-60%, midl LVH, no RWMAs, triv MR, mild TR   Dizziness 04/30/2021   Dyspnea    GERD (gastroesophageal reflux disease)    Glaucoma    Headache    History of bilateral cataract extraction    HLD (hyperlipidemia)    HTN (hypertension)    Long term current use of clopidogrel     Long-term use of aspirin  therapy    MRSA (methicillin resistant staph aureus) culture positive    2006 - 2008   NSTEMI (non-ST elevated myocardial infarction) (HCC) 07/27/2007   a.) LHC 07/30/2007: 30% mLAD, 99% D1, 50% pLCx, 40% mLCx, 70/75/85% dLCx, 50% OM1, 75/80/80 OM2, 50% pRCA, 60% mRCA, 85% dRCA --> not amenable  to PCI   OSA on CPAP    Osteoarthritis    Osteomyelitis of left foot (HCC)    PAD (peripheral artery disease)    a. 03/2022 Lower Ext Angio: No signif Ao-iliac dzs. R PT diff dzs and occluded above ankle. R Pedal arch intact w/ excellent antegrade flow provided by R AT-->no indication for revasc.   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   a.) LIMA-LAD, SVG-RCA, seq SVG-OM1-OM2   S/P tube myringotomy    Skin cancer, basal cell    Skin ulcer of right great toe, unspecified ulcer stage (HCC)    T1DM (type 1 diabetes mellitus) (HCC)    Wears glasses    Wears partial dentures    top    Past Surgical History:  Procedure Laterality Date   ABDOMINAL AORTOGRAM W/LOWER EXTREMITY N/A 03/26/2022   Procedure: ABDOMINAL AORTOGRAM W/LOWER EXTREMITY;  Surgeon: Darron Deatrice LABOR, MD;  [facility];  Service: Cardiovascular;  Laterality: N/A;   AMPUTATION TOE Bilateral 06/26/2023   Procedure: AMPUTATION TOE MPJ OF THE JOINT,PARTIAL TOE AMPUTUATION;  Surgeon: Janit Thresa HERO, DPM;  Location: [facility];  Service: Orthopedics/Podiatry;  Laterality: Bilateral;   AMPUTATION TOE Right 12/04/2023   Procedure: AMPUTATION TOE INTERPHALANGEAL 3RD;  Surgeon: Janit Thresa HERO, DPM;  Location: [facility];  Service: Orthopedics/Podiatry;  Laterality: Right;   APPENDECTOMY     CARDIAC CATHETERIZATION N/A 06/11/2016   Procedure: Left Heart Cath and Coronary Angiography;  Surgeon: Wolm JINNY Rhyme, MD;  Location: [facility];  Service: Cardiovascular;  Laterality: N/A;   CARPAL TUNNEL RELEASE Left 04/24/2016   Procedure: CARPAL TUNNEL RELEASE;  Surgeon: Ozell Flake, MD;  Location: [facility];  Service: Orthopedics;  Laterality: Left;   CATARACT EXTRACTION W/ INTRAOCULAR LENS IMPLANT Right 07/16/2012   CATARACT EXTRACTION W/ INTRAOCULAR LENS IMPLANT Left 08/13/2012   CORONARY ARTERY BYPASS GRAFT N/A 06/27/2016   Procedure: CORONARY ARTERY BYPASS  GRAFTING times four using left internal mammary artery and right leg saphenous vein;  Surgeon: Maude Fleeta Ochoa, MD;  Location: [facility];  Service: Open Heart Surgery;  Laterality: N/A;   FOREARM FRACTURE SURGERY Left 1961   IRRIGATION AND DEBRIDEMENT ABSCESS Right 07/08/2024   Procedure: IRRIGATION AND DEBRIDEMENT ULCER, WITH GRAFT APPLICATION RIGHT THIRD TOE;  Surgeon: Janit Thresa HERO, DPM;  Location: [facility];  Service: Orthopedics/Podiatry;  Laterality: Right;   IRRIGATION AND DEBRIDEMENT FOOT Left 07/08/2024   Procedure: IRRIGATION AND DEBRIDEMENT FOOT AMPUTATION STUMB, BONE BIOPSY FIRST METATARSAL;  Surgeon: Janit Thresa HERO, DPM;  Location: [facility];  Service: Orthopedics/Podiatry;  Laterality: Left;   KNEE ARTHROPLASTY Left 02/11/2016   Procedure: COMPUTER ASSISTED TOTAL KNEE ARTHROPLASTY;  Surgeon: Lynwood SHAUNNA Hue, MD;  Location: [facility];  Service: Orthopedics;  Laterality: Left;   KNEE ARTHROSCOPY Right 2000   LEFT HEART CATH AND CORONARY ANGIOGRAPHY Left 07/30/2007   Procedure: LEFT HEART CATH AND CORONARY ANGIOGRAPHY; Location: [facility]; Surgeon: Wolm Rhyme, MD   LEFT HEART CATH AND CORS/GRAFTS ANGIOGRAPHY Left 01/13/2017   Procedure: Left Heart Cath and Cors/Grafts Angiography;  Surgeon: Wolm JINNY Rhyme, MD;  Location: [facility];  Service: Cardiovascular;  Laterality: Left;   LEFT HEART CATH AND CORS/GRAFTS ANGIOGRAPHY Left 06/09/2023   Procedure: LEFT HEART CATH AND CORS/GRAFTS ANGIOGRAPHY;  Surgeon: Ammon Blunt, MD;  Location: [facility];  Service: Cardiovascular;  Laterality: Left;   LOWER EXTREMITY ANGIOGRAPHY Left 02/23/2024   Procedure: Lower Extremity Angiography;  Surgeon: Jama Cordella MATSU, MD;  Location: [facility];  Service: Cardiovascular;  Laterality: Left;   LOWER EXTREMITY ANGIOGRAPHY Right 05/03/2024   Procedure: Lower Extremity Angiography;  Surgeon: Jama Cordella MATSU, MD;  Location: [facility];  Service: Cardiovascular;   Laterality: Right;   LOWER EXTREMITY INTERVENTION Left 02/23/2024   Procedure: LOWER EXTREMITY INTERVENTION;  Surgeon: Jama Cordella MATSU, MD;  Location: [facility];  Service: Cardiovascular;  Laterality: Left;   METATARSAL HEAD EXCISION Right 03/21/2020   Procedure: METATARSAL HEAD RESECTION RIGHT AND DEBRIDEMENT OF ULCER;  Surgeon: Janit Thresa HERO, DPM;  Location: [facility];  Service: Podiatry;  Laterality: Right;   METATARSAL HEAD EXCISION Left 09/15/2023   Procedure: METATARSAL HEAD EXCISION;  Surgeon: Janit Thresa HERO, DPM;  Location: [facility];  Service: Orthopedics/Podiatry;  Laterality: Left;   MULTIPLE TOOTH EXTRACTIONS     TEE WITHOUT CARDIOVERSION N/A 06/27/2016   Procedure: TRANSESOPHAGEAL ECHOCARDIOGRAM (TEE);  Surgeon: Maude Fleeta Ochoa, MD;  Location: [facility] OR;  Service: Open Heart Surgery;  Laterality: N/A;   TENDON LENGTHENING Left 12/04/2023   Procedure: TENDON-ACHILLES LENGTHENING;  Surgeon: Janit Thresa HERO, DPM;  Location: [facility];  Service: Orthopedics/Podiatry;  Laterality: Left;   TRANSMETATARSAL AMPUTATION Left 12/04/2023   Procedure: TRANSMETATARSAL AMPUTATION;  Surgeon: Janit Thresa HERO,  DPM;  Location: [facility];  Service: Orthopedics/Podiatry;  Laterality: Left;    Allergies  Allergen Reactions   Latex Other (See Comments)    Blister and burning (58M insulin  pump latex band aid )   Nitroglycerin  Nausea Only and Other (See Comments)    Patches only - headache   Statins Other (See Comments)    BODY PAIN. Taking rosuvastatin  in the evenings and tolerating.    Fluvastatin Other (See Comments)    Muscle pain   Fluvastatin Sodium Other (See Comments)    Myalgias   Percocet [Oxycodone -Acetaminophen ] Itching   Rosuvastatin  Other (See Comments)    Myalgias. Taking in evening and tolerating   Simvastatin Other (See Comments)    Muscle pain, Myalgias   Lipitor [Atorvastatin] Other (See Comments) and Rash    BODY PAIN   Lisinopril  Nausea Only and Other (See Comments)     Other reaction(s): Dizziness   Zetia [Ezetimibe] Other (See Comments)    Muscle Pain   Objective/Physical Exam Ulcer to the distal tip of the right third toe amputation site stable.  Intermixed fibrotic and granular tissue noted.  The wound measures approximately 1.0 x 1.0 x 0.3 cm.  To the noted ulceration there is no eschar.  Moderate slough fibrin and necrotic tissue noted to the wound base.  No drainage.  No malodor.  The draining sinus to the left foot is resolved and healed.  No open wound noted.  No fluctuance or erythema concerning for residual infection  MR FOOT LEFT WO CONTRAST 06/06/2024 IMPRESSION: 1. Marrow edema in the distal 3 cm of the first metatarsal remnant is worrisome for osteomyelitis. Minimal marrow edema in the distal 1.5-2 cm of the second and third metatarsal remnants is nonspecific and could be reactive or due to early osteomyelitis. 2. Fluid collection anterior and subjacent to the first metatarsal remnant is worrisome for abscess. 3. Diffuse subcutaneous edema about the imaged foot could be due to cellulitis or dependent change.  Aerobic/Anaerobic Culture w Gram Stain (surgical/deep wound) Order: [id number]  Status: Final result     Next appt: 07/26/2024 at 10:00 AM in Podiatry [facility] CHRISTELLA Sar, [place])   Test Result Released: Yes (not seen)   Specimen Information: Path Tissue  0 Result Notes    Component Ref Range & Units (hover) 7 d ago  Specimen Description TISSUE Performed at [facility], [address] [place], [place] [address]  Special Requests NONE Performed at [facility], [address] [place], [place] [address]  Gram Stain NO WBC SEEN NO ORGANISMS SEEN  Culture RARE STAPHYLOCOCCUS SAPROPHYTICUS NO ANAEROBES ISOLATED Performed at [facility], [address], [place], [place] [address]  Report Status 07/13/2024 FINAL  Organism ID, Bacteria STAPHYLOCOCCUS SAPROPHYTICUS  Resulting Agency [facility]      Susceptibility   Staphylococcus saprophyticus    MIC    CIPROFLOXACIN  <=0.5 SENSI... Sensitive    CLINDAMYCIN >=8 RESISTANT Resistant    ERYTHROMYCIN >=8 RESISTANT Resistant    GENTAMICIN  <=0.5 SENSI... Sensitive    Inducible Clindamycin NEGATIVE Sensitive    OXACILLIN 2 RESISTANT Resistant    RIFAMPIN <=0.5 SENSI... Sensitive    TETRACYCLINE <=1 SENSITIVE Sensitive    TRIMETH /SULFA  <=10 SENSIT... Sensitive    VANCOMYCIN  <=0.5 SENSI... Sensitive           Susceptibility Comments  Staphylococcus saprophyticus  RARE STAPHYLOCOCCUS SAPROPHYTICUS     Aerobic/Anaerobic Culture w Gram Stain (surgical/deep wound) Order: [id number]  Status: Final result  Next appt: 07/26/2024 at 10:00 AM in Podiatry [facility] CHRISTELLA Sar, [place])   Test Result Released: Yes (not seen)   Specimen Information: Abscess  0 Result Notes  important suggestion  Newer results are available. Click to view them now.      Component Ref Range & Units (hover) 7 d ago  Specimen Description ABSCESS Performed at [facility], [address] [place], [place] [address]  Special Requests NONE Performed at [facility], [address] [place], [place] [address]  Gram Stain RARE WBC PRESENT, PREDOMINANTLY PMN NO ORGANISMS SEEN  Culture RARE ENTEROCOCCUS FAECALIS VANCOMYCIN  RESISTANT ENTEROCOCCUS ISOLATED NO ANAEROBES ISOLATED Performed at [facility], [address], [place], [place] [address]  Report Status 07/13/2024 FINAL  Organism ID, Bacteria ENTEROCOCCUS FAECALIS  Resulting Agency [facility]     Susceptibility   Enterococcus faecalis    MIC    AMPICILLIN <=2 SENSITIVE Sensitive    GENTAMICIN  SYNERGY RESISTANT Resistant    VANCOMYCIN  >=32 RESIST... Resistant           Susceptibility Comments  Enterococcus faecalis  RARE ENTEROCOCCUS FAECALIS     Specimen Collected: 07/08/24 15:34    RT third toe 09/23/2024  LT foot 09/23/2024  Assessment: POV # 1 DOS 07/08/24  RT 3RD TO DEBRIDEMENT OF ULCER W/ GRAFT APPLICATION, INCISION AND DRAINAGE LEFT FOOT, BONE BIOPSY 1ST METATARSAL LEFT.   Diabetic ulcer toe right w/ fat layer exposed Ulcer left foot with draining sinus; healed   Plan of Care:  -Patient was evaluated.  -Medically necessary excisional debridement including subcutaneous tissue was performed to the right third toe ulcer.  Excisional debridement of the necrotic nonviable tissue down to healthier bleeding viable tissue was performed with postdebridement measurement same as pre- -unfortunately the patient continues to have the persistent ulcer to the right foot.  It has failed several months of conservative routine debridements and care.   -Referral placed for second opinion/evaluation at [facility] wound care center.  Greatly appreciated -Continue Prisma collagen dressings to the right third toe daily until instructed otherwise at the wound care center.  Supplies provided -Order placed for new diabetic shoes with custom molded Plastizote insoles at [employer].  Faxed today -Return to clinic 6 weeks  Thresa EMERSON Sar, DPM [facility]  Dr. Thresa EMERSON Sar, DPM    [address] [place], [place] [address]                Office [phone]  Fax [phone]      "

## 2024-11-16 ENCOUNTER — Other Ambulatory Visit (INDEPENDENT_AMBULATORY_CARE_PROVIDER_SITE_OTHER): Payer: Self-pay | Admitting: Nurse Practitioner

## 2024-11-16 DIAGNOSIS — I739 Peripheral vascular disease, unspecified: Secondary | ICD-10-CM

## 2024-11-17 ENCOUNTER — Other Ambulatory Visit: Payer: Self-pay

## 2024-11-17 DIAGNOSIS — Z89421 Acquired absence of other right toe(s): Secondary | ICD-10-CM

## 2024-11-17 DIAGNOSIS — L97512 Non-pressure chronic ulcer of other part of right foot with fat layer exposed: Secondary | ICD-10-CM

## 2024-11-17 DIAGNOSIS — E1351 Other specified diabetes mellitus with diabetic peripheral angiopathy without gangrene: Secondary | ICD-10-CM

## 2024-11-17 DIAGNOSIS — Z89432 Acquired absence of left foot: Secondary | ICD-10-CM

## 2024-11-17 DIAGNOSIS — E0843 Diabetes mellitus due to underlying condition with diabetic autonomic (poly)neuropathy: Secondary | ICD-10-CM

## 2024-11-18 ENCOUNTER — Ambulatory Visit (INDEPENDENT_AMBULATORY_CARE_PROVIDER_SITE_OTHER): Admitting: Vascular Surgery

## 2024-11-18 ENCOUNTER — Encounter (INDEPENDENT_AMBULATORY_CARE_PROVIDER_SITE_OTHER)

## 2024-11-21 ENCOUNTER — Other Ambulatory Visit: Payer: Self-pay | Admitting: Podiatry

## 2024-11-21 DIAGNOSIS — E08621 Diabetes mellitus due to underlying condition with foot ulcer: Secondary | ICD-10-CM

## 2024-11-24 ENCOUNTER — Ambulatory Visit (INDEPENDENT_AMBULATORY_CARE_PROVIDER_SITE_OTHER)

## 2024-11-24 ENCOUNTER — Ambulatory Visit (INDEPENDENT_AMBULATORY_CARE_PROVIDER_SITE_OTHER): Admitting: Nurse Practitioner

## 2024-11-24 ENCOUNTER — Encounter (INDEPENDENT_AMBULATORY_CARE_PROVIDER_SITE_OTHER): Payer: Self-pay | Admitting: Nurse Practitioner

## 2024-11-24 VITALS — BP 131/75 | HR 69 | Resp 18 | Ht 70.5 in | Wt 284.2 lb

## 2024-11-24 DIAGNOSIS — I739 Peripheral vascular disease, unspecified: Secondary | ICD-10-CM

## 2024-11-24 DIAGNOSIS — Z9889 Other specified postprocedural states: Secondary | ICD-10-CM | POA: Diagnosis not present

## 2024-11-24 DIAGNOSIS — I10 Essential (primary) hypertension: Secondary | ICD-10-CM

## 2024-11-24 DIAGNOSIS — E785 Hyperlipidemia, unspecified: Secondary | ICD-10-CM | POA: Diagnosis not present

## 2024-11-25 LAB — VAS US ABI WITH/WO TBI

## 2024-11-27 ENCOUNTER — Encounter (INDEPENDENT_AMBULATORY_CARE_PROVIDER_SITE_OTHER): Payer: Self-pay | Admitting: Nurse Practitioner

## 2024-11-27 NOTE — Progress Notes (Signed)
 "  Subjective:    Patient ID: Lawrence [name], male    DOB: 07/01/1951, 74 y.o.   MRN: [id number] Chief Complaint  Patient presents with   Follow-up    3 month follow up + ABI    HPI  Discussed the use of AI scribe software for clinical note transcription with the patient, who gave verbal consent to proceed.  History of Present Illness Lawrence [name] is a 74 year old male with type 2 diabetes and peripheral arterial disease who presents for follow-up of a chronic non-healing ulcer at the right third toe stump.  He has a chronic non-healing ulcer at the right third toe stump, persisting since surgical amputation on December 04, 2023. The wound has failed to heal over the past year and continues to intermittently bleed and drain purulent material. He notes variable wound appearance, with some days showing improvement and other days worsening. He applies a plasma-based topical treatment and covers the area with a Band-Aid. He expresses significant frustration and fatigue with the prolonged healing process, stating he is tired of dealing with the wound and is considering further amputation if healing does not occur.  He previously underwent a skin graft to the right third toe stump last year, but healing has remained incomplete. He has been evaluated by wound care specialists and is scheduled for further assessment. Despite prior interventions, including referral to wound care and topical therapies, the ulcer remains refractory to treatment.  He underwent an angiogram in July 2025 and subsequently had an angioplasty performed on smaller distal vessels. He has not noticed significant improvement in wound healing since the procedure. The patient reports that toe pressures were not performed due to prior amputations, and that his arteries are described as stiff or hard, making ABI measurements difficult to interpret.  He experiences pressure sensations in the toes and at the right third toe  stump, which can wake him at night and disrupt sleep. He describes episodes of sudden pressure or collapse sensation in the foot while sitting. He notes intermittent bleeding and purulent drainage from the wound. He does not consistently wear support socks due to difficulty with application, and his shoes are challenging to tie and keep secure.  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 He has been diabetic for 53 years and acknowledges the chronicity and complications associated with his diabetes.    Results Radiology Lower extremity angiogram (05/2024): Widely patent proximal vessels; distal small vessel disease treated with angioplasty  Diagnostic ABI: Non-diagnostic due to arterial stiffness; elevated value consistent with non-compressible arteries Lower extremity arterial waveforms: Triphasic waveforms; current waveforms slightly stronger compared to previous study   Review of Systems  Musculoskeletal:  Positive for gait problem.  Skin:  Positive for wound.  All other systems reviewed and are negative.      Objective:   Physical Exam Vitals reviewed.  HENT:     Head: Normocephalic.  Cardiovascular:     Rate and Rhythm: Normal rate.     Pulses:          Dorsalis pedis pulses are detected w/ Doppler on the right side and detected w/ Doppler on the left side.       Posterior tibial pulses are detected w/ Doppler on the right side and detected w/ Doppler on the left side.  Pulmonary:     Effort: Pulmonary effort is normal.  Skin:  General: Skin is warm and dry.  Neurological:     Mental Status: He is alert and oriented to person, place, and time.     Gait: Gait abnormal.  Psychiatric:        Mood and Affect: Mood normal.        Behavior: Behavior normal.        Thought Content: Thought content normal.        Judgment: Judgment normal.     Physical Exam EXTREMITIES: Right third toe  stump with bleeding and non-healing wound.  BP 131/75 (BP Location: Left Arm, Patient Position: Sitting, Cuff Size: Large)   Pulse 69   Resp 18   Ht 5' 10.5 (1.791 m)   Wt 284 lb 3.2 oz (128.9 kg)   BMI 40.20 kg/m   Past Medical History:  Diagnosis Date   Allergies    Anginal pain    Aortic atherosclerosis    Bilateral carotid artery disease    Breast pain, left 08/29/2022   CAD (coronary artery disease) 07/2007   a.) s/p NSTEMI 07/2007 - sig multi-vessel CAD not amenable to PCI; b.) LHC 06/11/2016: multi vessel CAD not amenable to PCI --> refer to CVTS; c.) s/p 4v CABG 06/27/2016; d.) LHC 06/09/2023: 75% mLM, 80% o-pLAD, 60% mLAD, 100% p-mLCx, 100% OM1, 95% pRCA, 100% mRCA, 50% m-dLAD. LIMA-LAD and SVG-OM1 patent. CTO SVG-RCA with L-R collaterals - med mgmt   Carpal tunnel syndrome on left    a.) s/p release 04/2016   Cerebral microvascular disease    CKD (chronic kidney disease), stage III (HCC)    COVID-19 12/09/2022   Diabetic peripheral neuropathy (HCC)    Diastolic dysfunction    a.) TTE 06/25/2016: EF 60-654%. no RWMAs, G1DD, mild AoV annular calc; b.) TTE 03/19/2017: EF 50-55%, no RWMAs, mild MR; c.) TTE 07/22/2019: EF 55-60%, midl LVH, no RWMAs, triv MR, mild TR   Dizziness 04/30/2021   Dyspnea    GERD (gastroesophageal reflux disease)    Glaucoma    Headache    History of bilateral cataract extraction    HLD (hyperlipidemia)    HTN (hypertension)    Long term current use of clopidogrel     Long-term use of aspirin  therapy    MRSA (methicillin resistant staph aureus) culture positive    2006 - 2008   NSTEMI (non-ST elevated myocardial infarction) (HCC) 07/27/2007   a.) LHC 07/30/2007: 30% mLAD, 99% D1, 50% pLCx, 40% mLCx, 70/75/85% dLCx, 50% OM1, 75/80/80 OM2, 50% pRCA, 60% mRCA, 85% dRCA --> not amenable to PCI   OSA on CPAP    Osteoarthritis    Osteomyelitis of left foot (HCC)    PAD (peripheral artery disease)    a. 03/2022 Lower Ext Angio: No signif Ao-iliac  dzs. R PT diff dzs and occluded above ankle. R Pedal arch intact w/ excellent antegrade flow provided by R AT-->no indication for revasc.   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   a.) LIMA-LAD, SVG-RCA, seq SVG-OM1-OM2   S/P tube myringotomy    Skin cancer, basal cell    Skin ulcer of right great toe, unspecified ulcer stage (HCC)    T1DM (type 1 diabetes mellitus) (HCC)    Wears glasses    Wears partial dentures    top    Social History   Socioeconomic History   Marital status: Married    Spouse name: Wainright,Lajuana   Number of children: Not on file  Years of education: Not on file   Highest education level: Not on file  Occupational History   Not on file  Tobacco Use   Smoking status: Former    Current packs/day: 0.00    Average packs/day: 1 pack/day for 16.7 years (16.7 ttl pk-yrs)    Types: Cigarettes    Start date: 11/03/1969    Quit date: 07/22/1986    Years since quitting: 38.3   Smokeless tobacco: Never  Vaping Use   Vaping status: Never Used  Substance and Sexual Activity   Alcohol use: Not Currently   Drug use: No   Sexual activity: Never  Other Topics Concern   Not on file  Social History Narrative   Married.  Lives at home with wife.    Social Drivers of Health   Tobacco Use: Medium Risk (11/27/2024)   Patient History    Smoking Tobacco Use: Former    Smokeless Tobacco Use: Never    Passive Exposure: Not on file  Financial Resource Strain: Low Risk  (06/16/2024)   Received from [facility]   Overall Financial Resource Strain (CARDIA)    Difficulty of Paying Living Expenses: Not hard at all  Food Insecurity: No Food Insecurity (10/20/2024)   Epic    Worried About Radiation Protection Practitioner of Food in the Last Year: Never true    Ran Out of Food in the Last Year: Never true  Transportation Needs: No Transportation Needs (10/20/2024)   Epic    Lack of Transportation (Medical): No    Lack of  Transportation (Non-Medical): No  Physical Activity: Inactive (10/20/2024)   Exercise Vital Sign    Days of Exercise per Week: 0 days    Minutes of Exercise per Session: 0 min  Stress: No Stress Concern Present (10/20/2024)   [employer] of Occupational Health - Occupational Stress Questionnaire    Feeling of Stress: Only a little  Social Connections: Moderately Integrated (10/20/2024)   Social Connection and Isolation Panel    Frequency of Communication with Friends and Family: More than three times a week    Frequency of Social Gatherings with Friends and Family: Twice a week    Attends Religious Services: 1 to 4 times per year    Active Member of Clubs or Organizations: No    Attends Banker Meetings: Never    Marital Status: Married  Catering Manager Violence: Not At Risk (10/20/2024)   Epic    Fear of Current or Ex-Partner: No    Emotionally Abused: No    Physically Abused: No    Sexually Abused: No  Depression (PHQ2-9): Low Risk (10/20/2024)   Depression (PHQ2-9)    PHQ-2 Score: 1  Alcohol Screen: Low Risk (10/20/2023)   Alcohol Screen    Last Alcohol Screening Score (AUDIT): 0  Housing: Unknown (10/20/2024)   Epic    Unable to Pay for Housing in the Last Year: No    Number of Times Moved in the Last Year: Not on file    Homeless in the Last Year: No  Utilities: Not At Risk (10/20/2024)   Epic    Threatened with loss of utilities: No  Health Literacy: Adequate Health Literacy (10/20/2024)   B1300 Health Literacy    Frequency of need for help with medical instructions: Never    Past Surgical History:  Procedure Laterality Date   ABDOMINAL AORTOGRAM W/LOWER EXTREMITY N/A 03/26/2022   Procedure: ABDOMINAL AORTOGRAM W/LOWER EXTREMITY;  Surgeon: Darron Deatrice LABOR, MD;  Location: [facility] INVASIVE  CV LAB;  Service: Cardiovascular;  Laterality: N/A;   AMPUTATION TOE Bilateral 06/26/2023   Procedure: AMPUTATION TOE MPJ OF THE JOINT,PARTIAL TOE AMPUTUATION;   Surgeon: Janit Thresa HERO, DPM;  Location: [facility] ORS;  Service: Orthopedics/Podiatry;  Laterality: Bilateral;   AMPUTATION TOE Right 12/04/2023   Procedure: AMPUTATION TOE INTERPHALANGEAL 3RD;  Surgeon: Janit Thresa HERO, DPM;  Location: [facility] ORS;  Service: Orthopedics/Podiatry;  Laterality: Right;   APPENDECTOMY     CARDIAC CATHETERIZATION N/A 06/11/2016   Procedure: Left Heart Cath and Coronary Angiography;  Surgeon: Wolm JINNY Rhyme, MD;  Location: [facility] INVASIVE CV LAB;  Service: Cardiovascular;  Laterality: N/A;   CARPAL TUNNEL RELEASE Left 04/24/2016   Procedure: CARPAL TUNNEL RELEASE;  Surgeon: Ozell Flake, MD;  Location: [facility] ORS;  Service: Orthopedics;  Laterality: Left;   CATARACT EXTRACTION W/ INTRAOCULAR LENS IMPLANT Right 07/16/2012   CATARACT EXTRACTION W/ INTRAOCULAR LENS IMPLANT Left 08/13/2012   CORONARY ARTERY BYPASS GRAFT N/A 06/27/2016   Procedure: CORONARY ARTERY BYPASS GRAFTING times four using left internal mammary artery and right leg saphenous vein;  Surgeon: Maude Fleeta Ochoa, MD;  Location: [facility] OR;  Service: Open Heart Surgery;  Laterality: N/A;   FOREARM FRACTURE SURGERY Left 1961   IRRIGATION AND DEBRIDEMENT ABSCESS Right 07/08/2024   Procedure: IRRIGATION AND DEBRIDEMENT ULCER, WITH GRAFT APPLICATION RIGHT THIRD TOE;  Surgeon: Janit Thresa HERO, DPM;  Location: [facility] ORS;  Service: Orthopedics/Podiatry;  Laterality: Right;   IRRIGATION AND DEBRIDEMENT FOOT Left 07/08/2024   Procedure: IRRIGATION AND DEBRIDEMENT FOOT AMPUTATION STUMB, BONE BIOPSY FIRST METATARSAL;  Surgeon: Janit Thresa HERO, DPM;  Location: [facility] ORS;  Service: Orthopedics/Podiatry;  Laterality: Left;   KNEE ARTHROPLASTY Left 02/11/2016   Procedure: COMPUTER ASSISTED TOTAL KNEE ARTHROPLASTY;  Surgeon: Lynwood SHAUNNA Hue, MD;  Location: [facility] ORS;  Service: Orthopedics;  Laterality: Left;   KNEE ARTHROSCOPY Right 2000   LEFT HEART CATH AND CORONARY ANGIOGRAPHY Left 07/30/2007   Procedure: LEFT HEART CATH AND CORONARY  ANGIOGRAPHY; Location: [facility]; Surgeon: Wolm Rhyme, MD   LEFT HEART CATH AND CORS/GRAFTS ANGIOGRAPHY Left 01/13/2017   Procedure: Left Heart Cath and Cors/Grafts Angiography;  Surgeon: Wolm JINNY Rhyme, MD;  Location: [facility] INVASIVE CV LAB;  Service: Cardiovascular;  Laterality: Left;   LEFT HEART CATH AND CORS/GRAFTS ANGIOGRAPHY Left 06/09/2023   Procedure: LEFT HEART CATH AND CORS/GRAFTS ANGIOGRAPHY;  Surgeon: Ammon Blunt, MD;  Location: [facility] INVASIVE CV LAB;  Service: Cardiovascular;  Laterality: Left;   LOWER EXTREMITY ANGIOGRAPHY Left 02/23/2024   Procedure: Lower Extremity Angiography;  Surgeon: Jama Cordella MATSU, MD;  Location: [facility] INVASIVE CV LAB;  Service: Cardiovascular;  Laterality: Left;   LOWER EXTREMITY ANGIOGRAPHY Right 05/03/2024   Procedure: Lower Extremity Angiography;  Surgeon: Jama Cordella MATSU, MD;  Location: [facility] INVASIVE CV LAB;  Service: Cardiovascular;  Laterality: Right;   LOWER EXTREMITY INTERVENTION Left 02/23/2024   Procedure: LOWER EXTREMITY INTERVENTION;  Surgeon: Jama Cordella MATSU, MD;  Location: [facility] INVASIVE CV LAB;  Service: Cardiovascular;  Laterality: Left;   METATARSAL HEAD EXCISION Right 03/21/2020   Procedure: METATARSAL HEAD RESECTION RIGHT AND DEBRIDEMENT OF ULCER;  Surgeon: Janit Thresa HERO, DPM;  Location: [facility] OR;  Service: Podiatry;  Laterality: Right;   METATARSAL HEAD EXCISION Left 09/15/2023   Procedure: METATARSAL HEAD EXCISION;  Surgeon: Janit Thresa HERO, DPM;  Location: [facility] ORS;  Service: Orthopedics/Podiatry;  Laterality: Left;   MULTIPLE TOOTH EXTRACTIONS     TEE WITHOUT CARDIOVERSION N/A 06/27/2016   Procedure: TRANSESOPHAGEAL ECHOCARDIOGRAM (TEE);  Surgeon: Maude Fleeta Ochoa, MD;  Location: [facility] OR;  Service: Open Heart Surgery;  Laterality: N/A;   TENDON LENGTHENING Left 12/04/2023   Procedure: TENDON-ACHILLES LENGTHENING;  Surgeon: Janit Thresa HERO, DPM;  Location: [facility] ORS;  Service: Orthopedics/Podiatry;  Laterality: Left;    TRANSMETATARSAL AMPUTATION Left 12/04/2023   Procedure: TRANSMETATARSAL AMPUTATION;  Surgeon: Janit Thresa HERO, DPM;  Location: [facility] ORS;  Service: Orthopedics/Podiatry;  Laterality: Left;    Family History  Problem Relation Age of Onset   Hypertension Mother    Lupus Mother    Heart Problems Mother    Hypertension Father    Diabetes Father    Heart attack Father 73       died in his 29s   Drug abuse Brother    Heart disease Brother    Heart attack Maternal Grandmother    Arthritis Maternal Grandmother    Arthritis Maternal Grandfather    Heart attack Paternal Grandmother    Arthritis Paternal Grandmother    Arthritis Paternal Grandfather     Allergies[1]     Latest Ref Rng & Units 08/18/2024   12:46 PM 07/01/2024   11:00 AM 09/09/2023    2:38 PM  CBC  WBC 4.0 - 10.5 K/uL 7.4  7.4  7.2   Hemoglobin 13.0 - 17.0 g/dL 87.8  87.5  87.3   Hematocrit 39.0 - 52.0 % 35.8  36.7  36.9   Platelets 150 - 400 K/uL 191  187  191       CMP     Component Value Date/Time   NA 141 08/18/2024 1246   NA 141 06/09/2024 0000   NA 137 10/12/2013 0515   K 3.9 08/18/2024 1246   K 3.9 10/12/2013 0515   CL 101 08/18/2024 1246   CL 101 10/12/2013 0515   CO2 28 08/18/2024 1246   CO2 31 10/12/2013 0515   GLUCOSE 110 (H) 08/18/2024 1246   GLUCOSE 218 (H) 10/12/2013 0515   BUN 28 (H) 08/18/2024 1246   BUN 20 06/09/2024 0000   BUN 20 (H) 10/12/2013 0515   CREATININE 1.37 (H) 08/18/2024 1246   CREATININE 0.99 10/12/2013 0515   CALCIUM  9.7 08/18/2024 1246   CALCIUM  8.8 10/12/2013 0515   PROT 7.4 08/18/2024 1246   PROT 6.6 02/25/2022 1150   ALBUMIN  4.0 08/18/2024 1246   ALBUMIN  4.3 02/25/2022 1150   AST 16 08/18/2024 1246   ALT 12 08/18/2024 1246   ALKPHOS 85 08/18/2024 1246   BILITOT 0.9 08/18/2024 1246   BILITOT 0.7 02/25/2022 1150   GFR 52.06 (L) 11/02/2023 1454   EGFR 57.1 06/09/2024 0000   EGFR 57.1 06/09/2024 0000   EGFR 51 (L) 02/25/2022 1150   GFRNONAA 54 (L) 08/18/2024 1246    GFRNONAA >60 10/12/2013 0515     VAS US  ABI WITH/WO TBI Result Date: 11/25/2024  LOWER EXTREMITY DOPPLER STUDY Patient Name:  STEVENSON WINDMILLER  Date of Exam:   11/24/2024 Medical Rec #: [id number]        Accession #:    [id number] Date of Birth: November 16, 1950        Patient Gender: M Patient Age:   80 years Exam Location:  [facility] Procedure:      VAS US  ABI WITH/WO TBI Referring Phys: Cordella Shawl --------------------------------------------------------------------------------  Indications: Ulceration, and peripheral artery disease. High Risk Factors: Hypertension, hyperlipidemia, coronary artery disease.  Vascular Interventions: 05/03/2024 PTA of ata and pta  Multiple toe amputations                          02/23/2024: PTA and Stent placement withinEsprit 2.5 mm                         stent left ATA. PTA to 3 mm Left Posterior Tibial Artery                         and Tibioperoneal trunk. Comparison Study: 08/18/2024 Performing Technologist: Leafy Gibes RVS  Examination Guidelines: A complete evaluation includes at minimum, Doppler waveform signals and systolic blood pressure reading at the level of bilateral brachial, anterior tibial, and posterior tibial arteries, when vessel segments are accessible. Bilateral testing is considered an integral part of a complete examination. Photoelectric Plethysmograph (PPG) waveforms and toe systolic pressure readings are included as required and additional duplex testing as needed. Limited examinations for reoccurring indications may be performed as noted.  ABI Findings: +--------+------------------+-----+--------+--------+ Right   Rt Pressure (mmHg)IndexWaveformComment  +--------+------------------+-----+--------+--------+ [id number]                                     +--------+------------------+-----+--------+--------+ ATA     250               1.31         [place]        +--------+------------------+-----+--------+--------+ PTA     225               1.18         [place]       +--------+------------------+-----+--------+--------+ +--------+------------------+-----+--------+-------+ Left    Lt Pressure (mmHg)IndexWaveformComment +--------+------------------+-----+--------+-------+ [id number]                                    +--------+------------------+-----+--------+-------+ ATA     250               1.31         [place]      +--------+------------------+-----+--------+-------+ PTA     250               1.31         [place]      +--------+------------------+-----+--------+-------+ +-------+-----------+-----------+------------+------------+ ABI/TBIToday's ABIToday's TBIPrevious ABIPrevious TBI +-------+-----------+-----------+------------+------------+ Right  [place]         Amp        [place]          Amp          +-------+-----------+-----------+------------+------------+ Left   [place]         Amp                  Amp          +-------+-----------+-----------+------------+------------+ Bilateral ABIs appear essentially unchanged compared to prior study on 08/18/2024.  Summary: Right: Resting right ankle-brachial index indicates noncompressible right lower extremity arteries.  Left: Resting left ankle-brachial index indicates noncompressible left lower extremity arteries.  *See table(s) above for measurements and observations.  Electronically signed by Cordella Shawl MD on 11/25/2024 at 9:27:04 AM.    Final        Assessment & Plan:   1. Peripheral arterial disease with history of revascularization (Primary) Atherosclerosis of native arteries of the extremities Atherosclerosis  with non-compressible, calcified arteries causing unreliable ABI measurements. Vascular imaging shows preserved flow in larger vessels. Risk for vascular complications due to chronic arterial disease and diabetes. - Reviewed recent vascular studies and compared waveforms to  prior results, noting improvement and stability. - Continued surveillance of vascular status at three-month intervals due to ongoing wound healing issues. - Encouraged maintenance of vascular health, with activity recommendations tailored to avoid exacerbating the foot ulcer. - VAS US  LOWER EXTREMITY ARTERIAL DUPLEX; Future  2. Benign essential HTN Continue antihypertensive medications as already ordered, these medications have been reviewed and there are no changes at this time.  3. Hyperlipidemia, unspecified hyperlipidemia type Continue statin as ordered and reviewed, no changes at this time   Assessment and Plan Assessment & Plan        Medications Ordered Prior to Encounter[2]  There are no Patient Instructions on file for this visit. No follow-ups on file.   Parul Porcelli E Kay Shippy, NP      [1]  Allergies Allergen Reactions   Latex Other (See Comments)    Blister and burning (74M insulin  pump latex band aid )   Nitroglycerin  Nausea Only and Other (See Comments)    Patches only - headache   Statins Other (See Comments)    BODY PAIN. Taking rosuvastatin  in the evenings and tolerating.    Fluvastatin Other (See Comments)    Muscle pain   Fluvastatin Sodium Other (See Comments)    Myalgias   Percocet [Oxycodone -Acetaminophen ] Itching   Rosuvastatin  Other (See Comments)    Myalgias. Taking in evening and tolerating   Simvastatin Other (See Comments)    Muscle pain, Myalgias   Lipitor [Atorvastatin] Other (See Comments) and Rash    BODY PAIN   Lisinopril  Nausea Only and Other (See Comments)    Other reaction(s): Dizziness   Zetia [Ezetimibe] Other (See Comments)    Muscle Pain  [2]  Current Outpatient Medications on File Prior to Visit  Medication Sig Dispense Refill   acetaminophen  (TYLENOL ) 500 MG tablet Take 1,000 mg by mouth every 6 (six) hours as needed for moderate pain (pain score 4-6).     AIMOVIG 140 MG/ML SOAJ Inject 140 mg into the skin every 30 (thirty)  days.     albuterol  (VENTOLIN  HFA) 108 (90 Base) MCG/ACT inhaler Inhale 2 puffs into the lungs every 4 (four) hours as needed for wheezing. 1 each 0   amLODipine (NORVASC) 2.5 MG tablet Take 2.5 mg by mouth daily.     amoxicillin  (AMOXIL ) 500 MG capsule Take 2 capsules (1,000 mg total) by mouth 3 (three) times daily. 90 capsule 1   aspirin  81 MG EC tablet Take 81 mg by mouth daily. Swallow whole.     Atogepant (QULIPTA) 60 MG TABS Take 60 mg by mouth 1 day or 1 dose.     azelastine (ASTELIN) 0.1 % nasal spray Place 2 sprays into both nostrils as needed for rhinitis. Use in each nostril as directed     Calcium  Carb-Cholecalciferol  (CALCIUM  + D3 PO) Take 1 tablet by mouth in the morning and at bedtime.     carvedilol (COREG) 12.5 MG tablet Take 12.5 mg by mouth 2 (two) times daily with a meal. (Patient taking differently: Take 12.5 mg by mouth 2 (two) times daily with a meal. Taking half a tab as of 08/16/24)     Cholecalciferol  (VITAMIN D ) 50 MCG (2000 UT) tablet Take 2,000 Units by mouth in the morning and at bedtime.     clopidogrel  (  PLAVIX ) 75 MG tablet Take 1 tablet (75 mg total) by mouth daily. 30 tablet 0   famotidine  (ACID CONTROLLER) 10 MG tablet Take 10 mg by mouth in the morning.     furosemide  (LASIX ) 40 MG tablet Take 1 tablet (40 mg total) by mouth daily. 30 tablet 1   glucagon 1 MG injection Inject 1 mg into the skin once as needed.     GLUTOSE 15 40 % GEL Take 1 Tube by mouth once as needed for low blood sugar.     insulin  aspart (NOVOLOG ) 100 UNIT/ML injection Inject 20 Units into the skin 3 (three) times daily. (Patient taking differently: Inject 10-20 Units into the skin 3 (three) times daily with meals. Sliding Scale will go up to 25 units for BG > 300 - 400) 10 mL 5   insulin  glargine (LANTUS  SOLOSTAR) 100 UNIT/ML Solostar Pen 20 Units.     insulin  glargine-yfgn (SEMGLEE ) 100 UNIT/ML injection Inject 20 Units into the skin at bedtime.     isosorbide  mononitrate (IMDUR ) 60 MG 24  hr tablet Take 60 mg by mouth in the morning and at bedtime.     loratadine  (CLARITIN ) 10 MG tablet Take 10 mg by mouth in the morning.     nitroGLYCERIN  (NITROSTAT ) 0.4 MG SL tablet DISSOLVE 1 TABLET UNDER THE TONGUE EVERY 5 MINUTES AS NEEDED FOR CHEST PAIN 30 tablet 0   potassium chloride  SA (K-DUR,KLOR-CON ) 20 MEQ tablet Take 1 tablet (20 mEq total) by mouth daily. 30 tablet 1   rosuvastatin  (CRESTOR ) 20 MG tablet Take 1 tablet (20 mg total) by mouth daily at 6 PM. 30 tablet 1   Travoprost, BAK Free, (TRAVATAN) 0.004 % SOLN ophthalmic solution Place 1 drop into both eyes at bedtime.      vitamin C  (ASCORBIC ACID ) 500 MG tablet Take 500 mg by mouth 2 (two) times daily.      zonisamide (ZONEGRAN) 100 MG capsule Take 3 capsules (300 mg total) by mouth once daily .     No current facility-administered medications on file prior to visit.   "

## 2024-12-02 ENCOUNTER — Ambulatory Visit: Admitting: Podiatry

## 2024-12-02 DIAGNOSIS — L97512 Non-pressure chronic ulcer of other part of right foot with fat layer exposed: Secondary | ICD-10-CM | POA: Diagnosis not present

## 2024-12-02 DIAGNOSIS — E08621 Diabetes mellitus due to underlying condition with foot ulcer: Secondary | ICD-10-CM | POA: Diagnosis not present

## 2024-12-02 NOTE — Progress Notes (Signed)
 "  Chief Complaint  Patient presents with   Wound Check    Pt is here to check wound on the right 3rd toe, states some discharge from the toe and redness, no smell nut is concern due to history.    Subjective:  Patient presents today status post B/L foot surgery.  DOS: 07/08/2024.  Doing well.  Feet stable.  They have been applying Prisma collagen to the right third toe ulcer with a light dressing daily.  They have an upcoming appointment with the wound care center at [facility] next week  Past Medical History:  Diagnosis Date   Allergies    Anginal pain    Aortic atherosclerosis    Bilateral carotid artery disease    Breast pain, left 08/29/2022   CAD (coronary artery disease) 07/2007   a.) s/p NSTEMI 07/2007 - sig multi-vessel CAD not amenable to PCI; b.) LHC 06/11/2016: multi vessel CAD not amenable to PCI --> refer to CVTS; c.) s/p 4v CABG 06/27/2016; d.) LHC 06/09/2023: 75% mLM, 80% o-pLAD, 60% mLAD, 100% p-mLCx, 100% OM1, 95% pRCA, 100% mRCA, 50% m-dLAD. LIMA-LAD and SVG-OM1 patent. CTO SVG-RCA with L-R collaterals - med mgmt   Carpal tunnel syndrome on left    a.) s/p release 04/2016   Cerebral microvascular disease    CKD (chronic kidney disease), stage III (HCC)    COVID-19 12/09/2022   Diabetic peripheral neuropathy (HCC)    Diastolic dysfunction    a.) TTE 06/25/2016: EF 60-654%. no RWMAs, G1DD, mild AoV annular calc; b.) TTE 03/19/2017: EF 50-55%, no RWMAs, mild MR; c.) TTE 07/22/2019: EF 55-60%, midl LVH, no RWMAs, triv MR, mild TR   Dizziness 04/30/2021   Dyspnea    GERD (gastroesophageal reflux disease)    Glaucoma    Headache    History of bilateral cataract extraction    HLD (hyperlipidemia)    HTN (hypertension)    Long term current use of clopidogrel     Long-term use of aspirin  therapy    MRSA (methicillin resistant staph aureus) culture positive    2006 - 2008   NSTEMI (non-ST elevated myocardial infarction) (HCC) 07/27/2007   a.) LHC 07/30/2007: 30% mLAD, 99% D1,  50% pLCx, 40% mLCx, 70/75/85% dLCx, 50% OM1, 75/80/80 OM2, 50% pRCA, 60% mRCA, 85% dRCA --> not amenable to PCI   OSA on CPAP    Osteoarthritis    Osteomyelitis of left foot (HCC)    PAD (peripheral artery disease)    a. 03/2022 Lower Ext Angio: No signif Ao-iliac dzs. R PT diff dzs and occluded above ankle. R Pedal arch intact w/ excellent antegrade flow provided by R AT-->no indication for revasc.   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   a.) LIMA-LAD, SVG-RCA, seq SVG-OM1-OM2   S/P tube myringotomy    Skin cancer, basal cell    Skin ulcer of right great toe, unspecified ulcer stage (HCC)    T1DM (type 1 diabetes mellitus) (HCC)    Wears glasses    Wears partial dentures    top    Past Surgical History:  Procedure Laterality Date   ABDOMINAL AORTOGRAM W/LOWER EXTREMITY N/A 03/26/2022   Procedure: ABDOMINAL AORTOGRAM W/LOWER EXTREMITY;  Surgeon: Darron Deatrice LABOR, MD;  Location: [facility];  Service: Cardiovascular;  Laterality: N/A;   AMPUTATION TOE Bilateral 06/26/2023   Procedure: AMPUTATION TOE MPJ OF THE JOINT,PARTIAL TOE AMPUTUATION;  Surgeon: Janit Thresa HERO, DPM;  Location:  [facility];  Service: Orthopedics/Podiatry;  Laterality: Bilateral;   AMPUTATION TOE Right 12/04/2023   Procedure: AMPUTATION TOE INTERPHALANGEAL 3RD;  Surgeon: Janit Thresa HERO, DPM;  Location: [facility];  Service: Orthopedics/Podiatry;  Laterality: Right;   APPENDECTOMY     CARDIAC CATHETERIZATION N/A 06/11/2016   Procedure: Left Heart Cath and Coronary Angiography;  Surgeon: Wolm JINNY Rhyme, MD;  Location: [facility];  Service: Cardiovascular;  Laterality: N/A;   CARPAL TUNNEL RELEASE Left 04/24/2016   Procedure: CARPAL TUNNEL RELEASE;  Surgeon: Ozell Flake, MD;  Location: [facility];  Service: Orthopedics;  Laterality: Left;   CATARACT EXTRACTION W/ INTRAOCULAR LENS IMPLANT Right 07/16/2012   CATARACT EXTRACTION W/ INTRAOCULAR LENS IMPLANT  Left 08/13/2012   CORONARY ARTERY BYPASS GRAFT N/A 06/27/2016   Procedure: CORONARY ARTERY BYPASS GRAFTING times four using left internal mammary artery and right leg saphenous vein;  Surgeon: Maude Fleeta Ochoa, MD;  Location: [facility] OR;  Service: Open Heart Surgery;  Laterality: N/A;   FOREARM FRACTURE SURGERY Left 1961   IRRIGATION AND DEBRIDEMENT ABSCESS Right 07/08/2024   Procedure: IRRIGATION AND DEBRIDEMENT ULCER, WITH GRAFT APPLICATION RIGHT THIRD TOE;  Surgeon: Janit Thresa HERO, DPM;  Location: [facility];  Service: Orthopedics/Podiatry;  Laterality: Right;   IRRIGATION AND DEBRIDEMENT FOOT Left 07/08/2024   Procedure: IRRIGATION AND DEBRIDEMENT FOOT AMPUTATION STUMB, BONE BIOPSY FIRST METATARSAL;  Surgeon: Janit Thresa HERO, DPM;  Location: [facility];  Service: Orthopedics/Podiatry;  Laterality: Left;   KNEE ARTHROPLASTY Left 02/11/2016   Procedure: COMPUTER ASSISTED TOTAL KNEE ARTHROPLASTY;  Surgeon: Lynwood SHAUNNA Hue, MD;  Location: [facility];  Service: Orthopedics;  Laterality: Left;   KNEE ARTHROSCOPY Right 2000   LEFT HEART CATH AND CORONARY ANGIOGRAPHY Left 07/30/2007   Procedure: LEFT HEART CATH AND CORONARY ANGIOGRAPHY; Location: [facility]; Surgeon: Wolm Rhyme, MD   LEFT HEART CATH AND CORS/GRAFTS ANGIOGRAPHY Left 01/13/2017   Procedure: Left Heart Cath and Cors/Grafts Angiography;  Surgeon: Wolm JINNY Rhyme, MD;  Location: [facility];  Service: Cardiovascular;  Laterality: Left;   LEFT HEART CATH AND CORS/GRAFTS ANGIOGRAPHY Left 06/09/2023   Procedure: LEFT HEART CATH AND CORS/GRAFTS ANGIOGRAPHY;  Surgeon: Ammon Blunt, MD;  Location: [facility];  Service: Cardiovascular;  Laterality: Left;   LOWER EXTREMITY ANGIOGRAPHY Left 02/23/2024   Procedure: Lower Extremity Angiography;  Surgeon: Jama Cordella MATSU, MD;  Location: [facility];  Service: Cardiovascular;  Laterality: Left;   LOWER EXTREMITY ANGIOGRAPHY Right 05/03/2024   Procedure: Lower Extremity Angiography;   Surgeon: Jama Cordella MATSU, MD;  Location: [facility];  Service: Cardiovascular;  Laterality: Right;   LOWER EXTREMITY INTERVENTION Left 02/23/2024   Procedure: LOWER EXTREMITY INTERVENTION;  Surgeon: Jama Cordella MATSU, MD;  Location: [facility];  Service: Cardiovascular;  Laterality: Left;   METATARSAL HEAD EXCISION Right 03/21/2020   Procedure: METATARSAL HEAD RESECTION RIGHT AND DEBRIDEMENT OF ULCER;  Surgeon: Janit Thresa HERO, DPM;  Location: [facility];  Service: Podiatry;  Laterality: Right;   METATARSAL HEAD EXCISION Left 09/15/2023   Procedure: METATARSAL HEAD EXCISION;  Surgeon: Janit Thresa HERO, DPM;  Location: [facility];  Service: Orthopedics/Podiatry;  Laterality: Left;   MULTIPLE TOOTH EXTRACTIONS     TEE WITHOUT CARDIOVERSION N/A 06/27/2016   Procedure: TRANSESOPHAGEAL ECHOCARDIOGRAM (TEE);  Surgeon: Maude Fleeta Ochoa, MD;  Location: [facility] OR;  Service: Open Heart Surgery;  Laterality: N/A;   TENDON LENGTHENING Left 12/04/2023   Procedure: TENDON-ACHILLES LENGTHENING;  Surgeon: Janit Thresa HERO, DPM;  Location: [facility];  Service: Orthopedics/Podiatry;  Laterality: Left;   TRANSMETATARSAL AMPUTATION Left 12/04/2023   Procedure: TRANSMETATARSAL AMPUTATION;  Surgeon: Janit Thresa HERO, DPM;  Location: [facility];  Service: Orthopedics/Podiatry;  Laterality: Left;    Allergies  Allergen Reactions   Latex Other (See Comments)    Blister and burning (48M insulin  pump latex band aid )   Nitroglycerin  Nausea Only and Other (See Comments)    Patches only - headache   Statins Other (See Comments)    BODY PAIN. Taking rosuvastatin  in the evenings and tolerating.    Fluvastatin Other (See Comments)    Muscle pain   Fluvastatin Sodium Other (See Comments)    Myalgias   Percocet [Oxycodone -Acetaminophen ] Itching   Rosuvastatin  Other (See Comments)    Myalgias. Taking in evening and tolerating   Simvastatin Other (See Comments)    Muscle pain, Myalgias   Lipitor [Atorvastatin] Other  (See Comments) and Rash    BODY PAIN   Lisinopril  Nausea Only and Other (See Comments)    Other reaction(s): Dizziness   Zetia [Ezetimibe] Other (See Comments)    Muscle Pain    RT third toe ulcer 12/02/2024  Objective/Physical Exam Ulcer to the distal tip of the right third toe amputation site stable.  Intermixed fibrotic and granular tissue noted.  The wound measures approximately 1.3 x 1.3 x 0.3 cm.  To the noted ulceration there is no eschar.  Moderate slough fibrin and necrotic tissue noted to the wound base.  No drainage.  No malodor.  The draining sinus to the left foot is resolved and healed.  No open wound noted.  No fluctuance or erythema concerning for residual infection  MR FOOT LEFT WO CONTRAST 06/06/2024 IMPRESSION: 1. Marrow edema in the distal 3 cm of the first metatarsal remnant is worrisome for osteomyelitis. Minimal marrow edema in the distal 1.5-2 cm of the second and third metatarsal remnants is nonspecific and could be reactive or due to early osteomyelitis. 2. Fluid collection anterior and subjacent to the first metatarsal remnant is worrisome for abscess. 3. Diffuse subcutaneous edema about the imaged foot could be due to cellulitis or dependent change.  Aerobic/Anaerobic Culture w Gram Stain (surgical/deep wound) Order: [id number]  Status: Final result     Next appt: 07/26/2024 at 10:00 AM in Podiatry [facility] HERO Janit, [place])   Test Result Released: Yes (not seen)   Specimen Information: Path Tissue  0 Result Notes    Component Ref Range & Units (hover) 7 d ago  Specimen Description TISSUE Performed at [facility], [address] [place], [place] [address]  Special Requests NONE Performed at [facility], [address] [place], [place] [address]  Gram Stain NO WBC SEEN NO ORGANISMS SEEN  Culture RARE STAPHYLOCOCCUS SAPROPHYTICUS NO ANAEROBES ISOLATED Performed at [facility], [address] [place], [place] [address]   Report Status 07/13/2024 FINAL  Organism ID, Bacteria STAPHYLOCOCCUS SAPROPHYTICUS  Resulting Agency [facility]     Susceptibility   Staphylococcus saprophyticus    MIC    CIPROFLOXACIN  <=0.5 SENSI... Sensitive    CLINDAMYCIN >=8 RESISTANT Resistant    ERYTHROMYCIN >=8 RESISTANT Resistant    GENTAMICIN  <=0.5 SENSI... Sensitive    Inducible Clindamycin NEGATIVE Sensitive    OXACILLIN 2 RESISTANT Resistant    RIFAMPIN <=0.5 SENSI... Sensitive    TETRACYCLINE <=1 SENSITIVE Sensitive    TRIMETH /SULFA  <=10 SENSIT... Sensitive    VANCOMYCIN  <=0.5 SENSI... Sensitive           Susceptibility  Comments  Staphylococcus saprophyticus  RARE STAPHYLOCOCCUS SAPROPHYTICUS     Aerobic/Anaerobic Culture w Gram Stain (surgical/deep wound) Order: [id number]  Status: Final result     Next appt: 07/26/2024 at 10:00 AM in Podiatry [facility] CHRISTELLA Sar, [place])   Test Result Released: Yes (not seen)   Specimen Information: Abscess  0 Result Notes  important suggestion  Newer results are available. Click to view them now.      Component Ref Range & Units (hover) 7 d ago  Specimen Description ABSCESS Performed at [facility] [address] [place], [place] [address]  Special Requests NONE Performed at [facility], [address] [place], [place] [address]  Gram Stain RARE WBC PRESENT, PREDOMINANTLY PMN NO ORGANISMS SEEN  Culture RARE ENTEROCOCCUS FAECALIS VANCOMYCIN  RESISTANT ENTEROCOCCUS ISOLATED NO ANAEROBES ISOLATED Performed at [facility], [address], [place], [place] [address]  Report Status 07/13/2024 FINAL  Organism ID, Bacteria ENTEROCOCCUS FAECALIS  Resulting Agency [facility]     Susceptibility   Enterococcus faecalis    MIC    AMPICILLIN <=2 SENSITIVE Sensitive    GENTAMICIN  SYNERGY RESISTANT Resistant    VANCOMYCIN  >=32 RESIST... Resistant           Susceptibility Comments  Enterococcus faecalis  RARE ENTEROCOCCUS FAECALIS      Specimen Collected: 07/08/24 15:34    RT third toe 09/23/2024  LT foot 09/23/2024  Assessment: POV # 1 DOS 07/08/24 RT 3RD TO DEBRIDEMENT OF ULCER W/ GRAFT APPLICATION, INCISION AND DRAINAGE LEFT FOOT, BONE BIOPSY 1ST METATARSAL LEFT.   Diabetic ulcer toe right w/ fat layer exposed Ulcer left foot with draining sinus; healed   Plan of Care:  -Patient was evaluated.  -Medically necessary excisional debridement including subcutaneous tissue was performed to the right third toe ulcer.  Excisional debridement of the necrotic nonviable tissue down to healthier bleeding viable tissue was performed with postdebridement measurement same as pre- -pending appointment next week with [facility] wound care center.  Today we had a conversation regarding the patient's condition.  I really appreciate second opinion at the wound care center and hopefully they are able to make improvement with the wound.  He has not had the wound for about 1 year now.  If there continues to be nonprogressive change of the wound I do believe that surgery would be appropriate.  I do not necessarily believe that isolated amputation of the third toe would fix his problems and it will exacerbate potential complications and issues with the remaining 4th and 5th digits.  I would recommend transmetatarsal amputation.  He is done well with the left foot and I believe TMA would be the most appropriate -Order was placed for new diabetic shoes with custom molded Plastizote insoles at [facility] medical supply last visit on 11/11/2024.  Pending. - For now I will handover management of the chronic ulcer to the [facility] wound care center.  Return to clinic with me PRN  Thresa EMERSON Sar, DPM [facility]  Dr. Thresa EMERSON Sar, DPM    [address] [place], [place] [address]                Office [phone]  Fax [phone]      "

## 2024-12-05 ENCOUNTER — Encounter: Admitting: Physician Assistant

## 2024-12-13 ENCOUNTER — Encounter: Admitting: Physician Assistant

## 2025-01-16 ENCOUNTER — Encounter: Admitting: Physician Assistant

## 2025-02-22 ENCOUNTER — Ambulatory Visit (INDEPENDENT_AMBULATORY_CARE_PROVIDER_SITE_OTHER): Admitting: Nurse Practitioner

## 2025-02-22 ENCOUNTER — Encounter (INDEPENDENT_AMBULATORY_CARE_PROVIDER_SITE_OTHER)
